# Patient Record
Sex: Male | Born: 1980 | Race: Black or African American | Hispanic: No | Marital: Single | Smoking: Current every day smoker
Health system: Southern US, Community
[De-identification: ages and names within clinical notes are randomized; demographics above are authoritative.]

## PROBLEM LIST (undated history)

## (undated) ENCOUNTER — Emergency Department (HOSPITAL_COMMUNITY): Payer: Self-pay | Source: Home / Self Care

## (undated) ENCOUNTER — Emergency Department (HOSPITAL_COMMUNITY): Payer: Medicare Other | Source: Home / Self Care

## (undated) DIAGNOSIS — I10 Essential (primary) hypertension: Secondary | ICD-10-CM

## (undated) DIAGNOSIS — N186 End stage renal disease: Secondary | ICD-10-CM

## (undated) DIAGNOSIS — Z91199 Patient's noncompliance with other medical treatment and regimen due to unspecified reason: Secondary | ICD-10-CM

## (undated) DIAGNOSIS — K859 Acute pancreatitis without necrosis or infection, unspecified: Secondary | ICD-10-CM

## (undated) DIAGNOSIS — Z992 Dependence on renal dialysis: Secondary | ICD-10-CM

## (undated) DIAGNOSIS — B20 Human immunodeficiency virus [HIV] disease: Secondary | ICD-10-CM

## (undated) DIAGNOSIS — Z9119 Patient's noncompliance with other medical treatment and regimen: Secondary | ICD-10-CM

## (undated) DIAGNOSIS — Z765 Malingerer [conscious simulation]: Secondary | ICD-10-CM

## (undated) DIAGNOSIS — Z5189 Encounter for other specified aftercare: Secondary | ICD-10-CM

## (undated) DIAGNOSIS — F6810 Factitious disorder, unspecified: Secondary | ICD-10-CM

## (undated) DIAGNOSIS — R918 Other nonspecific abnormal finding of lung field: Secondary | ICD-10-CM

## (undated) DIAGNOSIS — N19 Unspecified kidney failure: Secondary | ICD-10-CM

## (undated) DIAGNOSIS — IMO0001 Reserved for inherently not codable concepts without codable children: Secondary | ICD-10-CM

## (undated) HISTORY — PX: AV FISTULA PLACEMENT: SHX1204

## (undated) HISTORY — PX: DIALYSIS FISTULA CREATION: SHX611

## (undated) HISTORY — PX: HIP SURGERY: SHX245

## (undated) HISTORY — PX: HIP FRACTURE SURGERY: SHX118

---

## 2001-12-19 DIAGNOSIS — Z21 Asymptomatic human immunodeficiency virus [HIV] infection status: Secondary | ICD-10-CM

## 2001-12-19 DIAGNOSIS — B20 Human immunodeficiency virus [HIV] disease: Secondary | ICD-10-CM

## 2001-12-19 HISTORY — DX: Human immunodeficiency virus (HIV) disease: B20

## 2001-12-19 HISTORY — DX: Asymptomatic human immunodeficiency virus (hiv) infection status: Z21

## 2010-12-19 DIAGNOSIS — K859 Acute pancreatitis without necrosis or infection, unspecified: Secondary | ICD-10-CM

## 2010-12-19 HISTORY — DX: Acute pancreatitis without necrosis or infection, unspecified: K85.90

## 2012-03-07 ENCOUNTER — Emergency Department: Payer: Self-pay | Admitting: Emergency Medicine

## 2012-03-07 LAB — COMPREHENSIVE METABOLIC PANEL
Albumin: 3.6 g/dL (ref 3.4–5.0)
Alkaline Phosphatase: 120 U/L (ref 50–136)
BUN: 53 mg/dL — ABNORMAL HIGH (ref 7–18)
Bilirubin,Total: 0.7 mg/dL (ref 0.2–1.0)
Creatinine: 9.67 mg/dL — ABNORMAL HIGH (ref 0.60–1.30)
EGFR (African American): 8 — ABNORMAL LOW
EGFR (Non-African Amer.): 7 — ABNORMAL LOW
Glucose: 136 mg/dL — ABNORMAL HIGH (ref 65–99)
Potassium: 5 mmol/L (ref 3.5–5.1)
Sodium: 133 mmol/L — ABNORMAL LOW (ref 136–145)

## 2012-03-07 LAB — CBC
HCT: 37.5 % — ABNORMAL LOW (ref 40.0–52.0)
MCHC: 33.3 g/dL (ref 32.0–36.0)
MCV: 102 fL — ABNORMAL HIGH (ref 80–100)
RBC: 3.67 10*6/uL — ABNORMAL LOW (ref 4.40–5.90)
RDW: 15.3 % — ABNORMAL HIGH (ref 11.5–14.5)

## 2012-03-07 LAB — LIPASE, BLOOD: Lipase: 233 U/L (ref 73–393)

## 2012-03-08 ENCOUNTER — Emergency Department: Payer: Self-pay | Admitting: Emergency Medicine

## 2012-03-08 LAB — COMPREHENSIVE METABOLIC PANEL
Alkaline Phosphatase: 113 U/L (ref 50–136)
Anion Gap: 13 (ref 7–16)
BUN: 62 mg/dL — ABNORMAL HIGH (ref 7–18)
Bilirubin,Total: 0.6 mg/dL (ref 0.2–1.0)
Chloride: 93 mmol/L — ABNORMAL LOW (ref 98–107)
Creatinine: 10.41 mg/dL — ABNORMAL HIGH (ref 0.60–1.30)
EGFR (African American): 8 — ABNORMAL LOW
Glucose: 217 mg/dL — ABNORMAL HIGH (ref 65–99)
Osmolality: 287 (ref 275–301)
Potassium: 5.1 mmol/L (ref 3.5–5.1)
SGOT(AST): 32 U/L (ref 15–37)
Sodium: 131 mmol/L — ABNORMAL LOW (ref 136–145)
Total Protein: 9.1 g/dL — ABNORMAL HIGH (ref 6.4–8.2)

## 2012-03-08 LAB — CBC
HCT: 35.6 % — ABNORMAL LOW (ref 40.0–52.0)
MCH: 33.9 pg (ref 26.0–34.0)
MCV: 102 fL — ABNORMAL HIGH (ref 80–100)
Platelet: 160 10*3/uL (ref 150–440)
RBC: 3.5 10*6/uL — ABNORMAL LOW (ref 4.40–5.90)

## 2012-03-08 LAB — AMYLASE: Amylase: 90 U/L (ref 25–115)

## 2012-03-08 LAB — RETICULOCYTES
Absolute Retic Count: 0.082 10*6/uL (ref 0.024–0.084)
Reticulocyte: 2.3 % — ABNORMAL HIGH (ref 0.5–1.5)

## 2012-03-08 LAB — LIPASE, BLOOD: Lipase: 271 U/L (ref 73–393)

## 2012-03-09 ENCOUNTER — Encounter (HOSPITAL_COMMUNITY): Payer: Self-pay | Admitting: Emergency Medicine

## 2012-03-09 ENCOUNTER — Observation Stay (HOSPITAL_COMMUNITY)
Admission: EM | Admit: 2012-03-09 | Discharge: 2012-03-10 | Discharge status: Against Medical Advice | Payer: Medicare Other | Attending: Internal Medicine | Admitting: Internal Medicine

## 2012-03-09 DIAGNOSIS — D573 Sickle-cell trait: Secondary | ICD-10-CM | POA: Diagnosis present

## 2012-03-09 DIAGNOSIS — E875 Hyperkalemia: Secondary | ICD-10-CM | POA: Insufficient documentation

## 2012-03-09 DIAGNOSIS — Z9119 Patient's noncompliance with other medical treatment and regimen: Secondary | ICD-10-CM | POA: Insufficient documentation

## 2012-03-09 DIAGNOSIS — K859 Acute pancreatitis without necrosis or infection, unspecified: Principal | ICD-10-CM | POA: Diagnosis present

## 2012-03-09 DIAGNOSIS — Z91158 Patient's noncompliance with renal dialysis for other reason: Secondary | ICD-10-CM

## 2012-03-09 DIAGNOSIS — N186 End stage renal disease: Secondary | ICD-10-CM | POA: Diagnosis present

## 2012-03-09 DIAGNOSIS — Z9115 Patient's noncompliance with renal dialysis: Secondary | ICD-10-CM

## 2012-03-09 DIAGNOSIS — B20 Human immunodeficiency virus [HIV] disease: Secondary | ICD-10-CM | POA: Diagnosis present

## 2012-03-09 DIAGNOSIS — R1110 Vomiting, unspecified: Secondary | ICD-10-CM

## 2012-03-09 DIAGNOSIS — I10 Essential (primary) hypertension: Secondary | ICD-10-CM | POA: Diagnosis present

## 2012-03-09 DIAGNOSIS — R1031 Right lower quadrant pain: Secondary | ICD-10-CM | POA: Insufficient documentation

## 2012-03-09 DIAGNOSIS — Z91199 Patient's noncompliance with other medical treatment and regimen due to unspecified reason: Secondary | ICD-10-CM

## 2012-03-09 DIAGNOSIS — I120 Hypertensive chronic kidney disease with stage 5 chronic kidney disease or end stage renal disease: Secondary | ICD-10-CM | POA: Insufficient documentation

## 2012-03-09 DIAGNOSIS — Z992 Dependence on renal dialysis: Secondary | ICD-10-CM | POA: Diagnosis present

## 2012-03-09 DIAGNOSIS — E119 Type 2 diabetes mellitus without complications: Secondary | ICD-10-CM | POA: Diagnosis present

## 2012-03-09 DIAGNOSIS — Z21 Asymptomatic human immunodeficiency virus [HIV] infection status: Secondary | ICD-10-CM | POA: Diagnosis present

## 2012-03-09 HISTORY — DX: Essential (primary) hypertension: I10

## 2012-03-09 HISTORY — DX: Unspecified kidney failure: N19

## 2012-03-09 HISTORY — DX: Encounter for other specified aftercare: Z51.89

## 2012-03-09 HISTORY — DX: Reserved for inherently not codable concepts without codable children: IMO0001

## 2012-03-09 LAB — CBC
HCT: 35.3 % — ABNORMAL LOW (ref 39.0–52.0)
Hemoglobin: 12.1 g/dL — ABNORMAL LOW (ref 13.0–17.0)
MCV: 97.8 fL (ref 78.0–100.0)
RBC: 3.61 MIL/uL — ABNORMAL LOW (ref 4.22–5.81)
WBC: 3.7 10*3/uL — ABNORMAL LOW (ref 4.0–10.5)

## 2012-03-09 LAB — BASIC METABOLIC PANEL
BUN: 82 mg/dL — ABNORMAL HIGH (ref 6–23)
CO2: 18 mEq/L — ABNORMAL LOW (ref 19–32)
Chloride: 89 mEq/L — ABNORMAL LOW (ref 96–112)
Creatinine, Ser: 11.91 mg/dL — ABNORMAL HIGH (ref 0.50–1.35)

## 2012-03-09 LAB — DIFFERENTIAL
Basophils Relative: 1 % (ref 0–1)
Eosinophils Relative: 7 % — ABNORMAL HIGH (ref 0–5)
Lymphs Abs: 0.8 10*3/uL (ref 0.7–4.0)
Monocytes Relative: 6 % (ref 3–12)
Neutro Abs: 2.4 10*3/uL (ref 1.7–7.7)

## 2012-03-09 MED ORDER — MORPHINE SULFATE 4 MG/ML IJ SOLN: 4.0000 mg | Freq: Once | INTRAMUSCULAR | Status: DC

## 2012-03-09 MED ORDER — MORPHINE SULFATE 4 MG/ML IJ SOLN
4.0000 mg | Freq: Once | INTRAMUSCULAR | Status: AC
  Administered 2012-03-09: 4 mg via INTRAVENOUS
  Filled 2012-03-09: qty 1

## 2012-03-09 MED ORDER — ONDANSETRON HCL 4 MG/2ML IJ SOLN
4.0000 mg | Freq: Once | INTRAMUSCULAR | Status: AC
  Administered 2012-03-09: 4 mg via INTRAVENOUS
  Filled 2012-03-09: qty 2

## 2012-03-09 NOTE — ED Notes (Signed)
PT. REPORTS UPPER ABDOMINAL PAIN WITH VOMITTING AND DIARRHEA ONSET THIS MORNING , DENIES FEVER OR CHILLS. HEMODIALYSIS LAST Tuesday.

## 2012-03-09 NOTE — ED Provider Notes (Signed)
History     CSN: [id number]  Arrival date & time 03/09/12  2040   First MD Initiated Contact with Patient 03/09/12 2207      Chief Complaint  Patient presents with  . Abdominal Pain    (Consider location/radiation/quality/duration/timing/severity/associated sxs/prior treatment) HPI Comments: Patient with a history of sickle cell anemia, hypertension, diabetes, and renal failure presents emergency department with chief complaint of upper abdominal pain. Patient gets dialysis Tuesday Thursday Saturday at [facility] and verbalizes medicine and dialysis compliance.  Onset of symptoms occurred last night.  Progression is gradually worsening. Abdominal pain is described as sharp and located in the right and left upper continence.  Pain does not radiate.  Severity is 8/10.  Associated symptoms include nausea, vomiting, and diarrhea.  Patient denies fever, night sweats, chills, shortness of breath, chest pain, leg swelling.  Patient is unable to make urine. No hx of abdominal surgery   Patient is a 31 y.o. male presenting with abdominal pain. The history is provided by the patient.  Abdominal Pain The primary symptoms of the illness include abdominal pain.    Past Medical History  Diagnosis Date  . Hypertension   . Diabetes mellitus   . Sickle cell anemia   . Renal failure   . Renal failure     History reviewed. No pertinent past surgical history.  No family history on file.  History  Substance Use Topics  . Smoking status: Never Smoker   . Smokeless tobacco: Not on file  . Alcohol Use: Yes      Review of Systems  Gastrointestinal: Positive for abdominal pain.  All other systems reviewed and are negative.    Allergies  Review of patient's allergies indicates no known allergies.  Home Medications   Current Outpatient Rx  Name Route Sig Dispense Refill  . ABACAVIR SULFATE-LAMIVUDINE 600-300 MG PO TABS Oral Take 1 tablet by mouth daily.    Marland Kitchen NEPHRO-VITE 0.8 MG  PO TABS Oral Take 0.8 mg by mouth at bedtime.    Marland Kitchen DARUNAVIR ETHANOLATE 400 MG PO TABS Oral Take 800 mg by mouth daily with breakfast.    . INSULIN ASPART 100 UNIT/ML [place] SOLN Subcutaneous Inject 2-6 Units into the skin 3 (three) times daily before meals. Per sliding scale    . INSULIN GLARGINE 100 UNIT/ML [place] SOLN Subcutaneous Inject 8 Units into the skin.    Marland Kitchen LISINOPRIL 5 MG PO TABS Oral Take 5 mg by mouth daily.    Marland Kitchen RITONAVIR 100 MG PO CAPS Oral Take 100 mg by mouth 2 (two) times daily.      BP 178/86  Pulse 89  Temp(Src) 97.8 F (36.6 C) (Oral)  Resp 23  SpO2 99%  Physical Exam  Nursing note and vitals reviewed. Constitutional: Vital signs are normal. He appears well-developed and well-nourished. No distress.  HENT:  Head: Normocephalic and atraumatic.  Mouth/Throat: Uvula is midline, oropharynx is clear and moist and mucous membranes are normal.  Eyes: Conjunctivae and EOM are normal. Pupils are equal, round, and reactive to light.  Neck: Normal range of motion and full passive range of motion without pain. Neck supple. No spinous process tenderness and no muscular tenderness present. No rigidity. No Brudzinski's sign noted.  Cardiovascular: Normal rate and regular rhythm.   Pulmonary/Chest: Effort normal and breath sounds normal. No accessory muscle usage. Not tachypneic. No respiratory distress.  Abdominal: Soft. Normal appearance. He exhibits no distension, no ascites, no pulsatile midline mass and no mass. There is  tenderness in the right lower quadrant. There is no rigidity, no rebound, no guarding, no CVA tenderness, no tenderness at McBurney's point and negative Murphy's sign. No hernia.    Lymphadenopathy:    He has no cervical adenopathy.  Neurological: He is alert.  Skin: Skin is warm and dry. No rash noted. He is not diaphoretic.  Psychiatric: He has a normal mood and affect. His speech is normal and behavior is normal.    ED Course  Procedures (including critical  care time)  Labs Reviewed  CBC - Abnormal; Notable for the following:    WBC 3.7 (*)    RBC 3.61 (*)    Hemoglobin 12.1 (*)    HCT 35.3 (*)    All other components within normal limits  DIFFERENTIAL - Abnormal; Notable for the following:    Eosinophils Relative 7 (*)    All other components within normal limits  BASIC METABOLIC PANEL - Abnormal; Notable for the following:    Sodium 130 (*)    Potassium 5.7 (*)    Chloride 89 (*)    CO2 18 (*)    Glucose, Bld 179 (*)    BUN 82 (*)    Creatinine, Ser 11.91 (*)    GFR calc non Af Amer 5 (*)    GFR calc Af Amer 6 (*)    All other components within normal limits  LIPASE, BLOOD - Abnormal; Notable for the following:    Lipase 129 (*)    All other components within normal limits  GLUCOSE, CAPILLARY - Abnormal; Notable for the following:    Glucose-Capillary 138 (*)    All other components within normal limits  POCT I-STAT, CHEM 8 - Abnormal; Notable for the following:    Sodium 134 (*)    Potassium 5.6 (*)    BUN 85 (*)    Creatinine, Ser 11.90 (*)    Glucose, Bld 170 (*)    Calcium, Ion 1.05 (*)    All other components within normal limits   US Abdomen Complete  03/10/2012  *RADIOLOGY REPORT*  Clinical Data:  Abdominal pain, question biliary disease, history hypertension, diabetes, sickle cell disease, renal failure  ULTRASOUND ABDOMEN:  Technique:  Sonography of upper abdominal structures was performed.  Comparison:  None  Gallbladder:  Normally distended without stones or wall thickening. No pericholecystic fluid or sonographic Murphy sign.  Common bile duct:  Normal caliber, 4 mm diameter  Liver:  Echogenic, likely fatty infiltration, though this can be seen with cirrhosis and certain infiltrative disorders.  No focal hepatic mass or nodularity  IVC:  Normal appearance  Pancreas:  Normal appearance  Spleen:  Normal morphology, slightly prominent in size at 13.7 cm length  Right kidney:  8.9 cm in length.  Cortical thinning and  increased cortical echogenicity.  Atrophic appearance without hydronephrosis. Small complicated nodule 12 x 11 x 12 mm in size and mid right kidney, incompletely characterized.  Left kidney:  9.6 cm length.  Cortical thinning and increased cortical echogenicity.  No mass or hydronephrosis.  Aorta:  Normal caliber  Other:  No free fluid  IMPRESSION: Probable fatty infiltration of the liver. No evidence of gallbladder disease. Atrophic kidneys with medical renal disease changes. Complicated nodule right kidney 12 x 11 x 12 mm in size, question complicated cystic versus solid lesion. Recommend follow-up non emergent noncontrast MR imaging to characterize (noncontrast due to history of renal failure).  Original Report Authenticated By: Lollie Marrow, M.D.     No  diagnosis found.   Date: 03/10/2012  Rate: 82  Rhythm: normal sinus rhythm  QRS Axis: right  Intervals: normal  ST/T Wave abnormalities: normal  Conduction Disutrbances:none  Narrative Interpretation:   Old EKG Reviewed: none available    MDM  Abdominal pain, Hyperkalemia, acute on chronic pancreatitis  US at [facility], ordered CT. Pts K+ came back at 5.6, EKG ordered and Istat to re-evaluate for possible hemolysis.Discussed w Dr. Manus Gunning. 15mg  of kayexalate given. Pt unable to tolerate PO contrast. Plan is to admit for hyperemesis.   Pt to be admitted to triad team 2 medsurg Dr. Benjamine Mola acute on chronic pancreatitis         Jaci Carrel, PA-C 03/10/12 0330

## 2012-03-09 NOTE — ED Notes (Signed)
Patient presents stating he has had vomiting for the past several days  Unable to keep anything down

## 2012-03-09 NOTE — ED Notes (Signed)
Attempt times 1 to start IV without success.  Will have another nurse attempt

## 2012-03-10 ENCOUNTER — Other Ambulatory Visit: Payer: Self-pay

## 2012-03-10 ENCOUNTER — Inpatient Hospital Stay (HOSPITAL_COMMUNITY)
Admission: EM | Admit: 2012-03-10 | Discharge: 2012-03-11 | DRG: 640 | Discharge status: Against Medical Advice | Payer: Self-pay | Attending: Family Medicine | Admitting: Family Medicine

## 2012-03-10 ENCOUNTER — Emergency Department (HOSPITAL_COMMUNITY): Payer: Medicare Other

## 2012-03-10 ENCOUNTER — Encounter (HOSPITAL_COMMUNITY): Payer: Self-pay | Admitting: Emergency Medicine

## 2012-03-10 ENCOUNTER — Encounter (HOSPITAL_COMMUNITY): Payer: Self-pay | Admitting: Radiology

## 2012-03-10 DIAGNOSIS — D5700 Hb-SS disease with crisis, unspecified: Secondary | ICD-10-CM

## 2012-03-10 DIAGNOSIS — Z9115 Patient's noncompliance with renal dialysis: Secondary | ICD-10-CM

## 2012-03-10 DIAGNOSIS — N179 Acute kidney failure, unspecified: Secondary | ICD-10-CM | POA: Diagnosis present

## 2012-03-10 DIAGNOSIS — Z21 Asymptomatic human immunodeficiency virus [HIV] infection status: Secondary | ICD-10-CM | POA: Diagnosis present

## 2012-03-10 DIAGNOSIS — Z794 Long term (current) use of insulin: Secondary | ICD-10-CM

## 2012-03-10 DIAGNOSIS — R112 Nausea with vomiting, unspecified: Secondary | ICD-10-CM | POA: Diagnosis present

## 2012-03-10 DIAGNOSIS — E878 Other disorders of electrolyte and fluid balance, not elsewhere classified: Secondary | ICD-10-CM | POA: Diagnosis present

## 2012-03-10 DIAGNOSIS — E119 Type 2 diabetes mellitus without complications: Secondary | ICD-10-CM | POA: Diagnosis present

## 2012-03-10 DIAGNOSIS — N19 Unspecified kidney failure: Secondary | ICD-10-CM

## 2012-03-10 DIAGNOSIS — I120 Hypertensive chronic kidney disease with stage 5 chronic kidney disease or end stage renal disease: Secondary | ICD-10-CM | POA: Diagnosis present

## 2012-03-10 DIAGNOSIS — E1129 Type 2 diabetes mellitus with other diabetic kidney complication: Secondary | ICD-10-CM | POA: Diagnosis present

## 2012-03-10 DIAGNOSIS — I10 Essential (primary) hypertension: Secondary | ICD-10-CM

## 2012-03-10 DIAGNOSIS — Z9119 Patient's noncompliance with other medical treatment and regimen: Secondary | ICD-10-CM

## 2012-03-10 DIAGNOSIS — E871 Hypo-osmolality and hyponatremia: Secondary | ICD-10-CM | POA: Diagnosis present

## 2012-03-10 DIAGNOSIS — B20 Human immunodeficiency virus [HIV] disease: Secondary | ICD-10-CM | POA: Diagnosis present

## 2012-03-10 DIAGNOSIS — R109 Unspecified abdominal pain: Secondary | ICD-10-CM | POA: Diagnosis present

## 2012-03-10 DIAGNOSIS — Z992 Dependence on renal dialysis: Secondary | ICD-10-CM

## 2012-03-10 DIAGNOSIS — D571 Sickle-cell disease without crisis: Secondary | ICD-10-CM | POA: Diagnosis present

## 2012-03-10 DIAGNOSIS — K859 Acute pancreatitis without necrosis or infection, unspecified: Secondary | ICD-10-CM | POA: Diagnosis present

## 2012-03-10 DIAGNOSIS — N186 End stage renal disease: Secondary | ICD-10-CM | POA: Diagnosis present

## 2012-03-10 DIAGNOSIS — R222 Localized swelling, mass and lump, trunk: Secondary | ICD-10-CM | POA: Diagnosis present

## 2012-03-10 DIAGNOSIS — E875 Hyperkalemia: Principal | ICD-10-CM | POA: Diagnosis present

## 2012-03-10 DIAGNOSIS — D573 Sickle-cell trait: Secondary | ICD-10-CM | POA: Diagnosis present

## 2012-03-10 DIAGNOSIS — R918 Other nonspecific abnormal finding of lung field: Secondary | ICD-10-CM | POA: Diagnosis present

## 2012-03-10 HISTORY — DX: Other nonspecific abnormal finding of lung field: R91.8

## 2012-03-10 HISTORY — DX: End stage renal disease: Z99.2

## 2012-03-10 HISTORY — DX: Dependence on renal dialysis: N18.6

## 2012-03-10 HISTORY — DX: Human immunodeficiency virus (HIV) disease: B20

## 2012-03-10 HISTORY — DX: Acute pancreatitis without necrosis or infection, unspecified: K85.90

## 2012-03-10 HISTORY — DX: Essential (primary) hypertension: I10

## 2012-03-10 LAB — COMPREHENSIVE METABOLIC PANEL
ALT: 11 U/L (ref 0–53)
AST: 12 U/L (ref 0–37)
BUN: 91 mg/dL — ABNORMAL HIGH (ref 6–23)
CO2: 20 mEq/L (ref 19–32)
Chloride: 90 mEq/L — ABNORMAL LOW (ref 96–112)
Glucose, Bld: 101 mg/dL — ABNORMAL HIGH (ref 70–99)
Potassium: 5.9 mEq/L — ABNORMAL HIGH (ref 3.5–5.1)

## 2012-03-10 LAB — CBC
Hemoglobin: 11.2 g/dL — ABNORMAL LOW (ref 13.0–17.0)
MCHC: 34.3 g/dL (ref 30.0–36.0)
Platelets: 197 10*3/uL (ref 150–400)
RBC: 3.32 MIL/uL — ABNORMAL LOW (ref 4.22–5.81)

## 2012-03-10 LAB — RETICULOCYTES
RBC.: 3.32 MIL/uL — ABNORMAL LOW (ref 4.22–5.81)
Retic Count, Absolute: 63.1 10*3/uL (ref 19.0–186.0)
Retic Ct Pct: 1.9 % (ref 0.4–3.1)

## 2012-03-10 LAB — DIFFERENTIAL
Lymphs Abs: 0.9 10*3/uL (ref 0.7–4.0)
Monocytes Absolute: 0.4 10*3/uL (ref 0.1–1.0)
Monocytes Relative: 9 % (ref 3–12)
Neutro Abs: 3 10*3/uL (ref 1.7–7.7)
Neutrophils Relative %: 66 % (ref 43–77)

## 2012-03-10 LAB — GLUCOSE, CAPILLARY
Glucose-Capillary: 105 mg/dL — ABNORMAL HIGH (ref 70–99)
Glucose-Capillary: 121 mg/dL — ABNORMAL HIGH (ref 70–99)
Glucose-Capillary: 87 mg/dL (ref 70–99)

## 2012-03-10 LAB — POCT I-STAT, CHEM 8
Chloride: 105 mEq/L (ref 96–112)
Creatinine, Ser: 11.9 mg/dL — ABNORMAL HIGH (ref 0.50–1.35)
Glucose, Bld: 170 mg/dL — ABNORMAL HIGH (ref 70–99)
HCT: 41 % (ref 39.0–52.0)
Potassium: 5.6 mEq/L — ABNORMAL HIGH (ref 3.5–5.1)

## 2012-03-10 MED ORDER — ALTEPLASE 2 MG IJ SOLR
2.0000 mg | Freq: Once | INTRAMUSCULAR | Status: DC | PRN
  Filled 2012-03-10: qty 2

## 2012-03-10 MED ORDER — PROMETHAZINE HCL 25 MG/ML IJ SOLN
12.5000 mg | Freq: Four times a day (QID) | INTRAMUSCULAR | Status: DC | PRN
  Administered 2012-03-10: 08:00:00 via INTRAVENOUS
  Administered 2012-03-10: 12.5 mg via INTRAVENOUS
  Filled 2012-03-10 (×2): qty 1

## 2012-03-10 MED ORDER — LISINOPRIL 5 MG PO TABS
5.0000 mg | ORAL_TABLET | Freq: Every day | ORAL | Status: DC
  Administered 2012-03-10: 5 mg via ORAL
  Filled 2012-03-10: qty 1

## 2012-03-10 MED ORDER — ZOLPIDEM TARTRATE 5 MG PO TABS: 5.0000 mg | ORAL_TABLET | Freq: Every evening | ORAL | Status: DC | PRN

## 2012-03-10 MED ORDER — RITONAVIR 100 MG PO CAPS
100.0000 mg | ORAL_CAPSULE | Freq: Two times a day (BID) | ORAL | Status: DC
  Administered 2012-03-10: 100 mg via ORAL
  Filled 2012-03-10 (×2): qty 1

## 2012-03-10 MED ORDER — IOHEXOL 300 MG/ML  SOLN
20.0000 mL | INTRAMUSCULAR | Status: AC
  Administered 2012-03-10: 20 mL via ORAL

## 2012-03-10 MED ORDER — ABACAVIR SULFATE-LAMIVUDINE 600-300 MG PO TABS
1.0000 | ORAL_TABLET | Freq: Every day | ORAL | Status: DC
  Filled 2012-03-10: qty 1

## 2012-03-10 MED ORDER — HYDROMORPHONE HCL PF 1 MG/ML IJ SOLN
1.0000 mg | Freq: Once | INTRAMUSCULAR | Status: AC
  Administered 2012-03-10: 1 mg via INTRAVENOUS
  Filled 2012-03-10: qty 1

## 2012-03-10 MED ORDER — LIDOCAINE HCL (PF) 1 % IJ SOLN: 5.0000 mL | INTRAMUSCULAR | Status: DC | PRN

## 2012-03-10 MED ORDER — SODIUM CHLORIDE 0.9 % IV SOLN
INTRAVENOUS | Status: DC
  Administered 2012-03-10: 05:00:00 via INTRAVENOUS

## 2012-03-10 MED ORDER — PROMETHAZINE HCL 25 MG/ML IJ SOLN
25.0000 mg | Freq: Once | INTRAMUSCULAR | Status: AC
  Administered 2012-03-10: 25 mg via INTRAVENOUS
  Filled 2012-03-10: qty 1

## 2012-03-10 MED ORDER — ONDANSETRON HCL 4 MG PO TABS: 4.0000 mg | ORAL_TABLET | Freq: Four times a day (QID) | ORAL | Status: DC | PRN

## 2012-03-10 MED ORDER — INSULIN ASPART 100 UNIT/ML ~~LOC~~ SOLN
0.0000 [IU] | Freq: Three times a day (TID) | SUBCUTANEOUS | Status: DC
  Administered 2012-03-10: 1 [IU] via SUBCUTANEOUS

## 2012-03-10 MED ORDER — HEPARIN SODIUM (PORCINE) 1000 UNIT/ML DIALYSIS
20.0000 [IU]/kg | INTRAMUSCULAR | Status: DC | PRN
  Filled 2012-03-10: qty 2

## 2012-03-10 MED ORDER — DOCUSATE SODIUM 283 MG RE ENEM: 1.0000 | ENEMA | RECTAL | Status: DC | PRN

## 2012-03-10 MED ORDER — ALBUTEROL SULFATE (5 MG/ML) 0.5% IN NEBU
2.5000 mg | INHALATION_SOLUTION | Freq: Once | RESPIRATORY_TRACT | Status: AC
Start: 2012-03-10 — End: 2012-03-11
  Administered 2012-03-11: 2.5 mg via RESPIRATORY_TRACT
  Filled 2012-03-10: qty 0.5

## 2012-03-10 MED ORDER — LIDOCAINE-PRILOCAINE 2.5-2.5 % EX CREA: 1.0000 "application " | TOPICAL_CREAM | CUTANEOUS | Status: DC | PRN

## 2012-03-10 MED ORDER — HEPARIN SODIUM (PORCINE) 1000 UNIT/ML DIALYSIS
1000.0000 [IU] | INTRAMUSCULAR | Status: DC | PRN
  Filled 2012-03-10: qty 1

## 2012-03-10 MED ORDER — ACETAMINOPHEN 650 MG RE SUPP: 650.0000 mg | Freq: Four times a day (QID) | RECTAL | Status: DC | PRN

## 2012-03-10 MED ORDER — HYDROMORPHONE HCL PF 1 MG/ML IJ SOLN
1.0000 mg | INTRAMUSCULAR | Status: DC | PRN
  Administered 2012-03-10: 2 mg via INTRAVENOUS
  Filled 2012-03-10: qty 2

## 2012-03-10 MED ORDER — PENTAFLUOROPROP-TETRAFLUOROETH EX AERO: 1.0000 "application " | INHALATION_SPRAY | CUTANEOUS | Status: DC | PRN

## 2012-03-10 MED ORDER — SODIUM CHLORIDE 0.9 % IV SOLN: 100.0000 mL | INTRAVENOUS | Status: DC | PRN

## 2012-03-10 MED ORDER — LAMIVUDINE 150 MG PO TABS
300.0000 mg | ORAL_TABLET | Freq: Every day | ORAL | Status: DC
  Administered 2012-03-10: 300 mg via ORAL
  Filled 2012-03-10: qty 2

## 2012-03-10 MED ORDER — HYDROMORPHONE HCL PF 1 MG/ML IJ SOLN
1.0000 mg | INTRAMUSCULAR | Status: DC | PRN
  Administered 2012-03-10 (×5): 2 mg via INTRAVENOUS
  Filled 2012-03-10 (×2): qty 1
  Filled 2012-03-10 (×4): qty 2

## 2012-03-10 MED ORDER — ONDANSETRON HCL 4 MG/2ML IJ SOLN
4.0000 mg | Freq: Once | INTRAMUSCULAR | Status: AC
  Administered 2012-03-10: 4 mg via INTRAVENOUS
  Filled 2012-03-10: qty 2

## 2012-03-10 MED ORDER — CALCIUM CARBONATE 1250 MG/5ML PO SUSP: 500.0000 mg | Freq: Four times a day (QID) | ORAL | Status: DC | PRN

## 2012-03-10 MED ORDER — SODIUM POLYSTYRENE SULFONATE 15 GM/60ML PO SUSP
15.0000 g | Freq: Once | ORAL | Status: AC
  Administered 2012-03-10: 15 g via ORAL
  Filled 2012-03-10: qty 60

## 2012-03-10 MED ORDER — CAMPHOR-MENTHOL 0.5-0.5 % EX LOTN
1.0000 "application " | TOPICAL_LOTION | Freq: Three times a day (TID) | CUTANEOUS | Status: DC | PRN
  Filled 2012-03-10: qty 222

## 2012-03-10 MED ORDER — IOHEXOL 300 MG/ML  SOLN
80.0000 mL | Freq: Once | INTRAMUSCULAR | Status: AC | PRN
  Administered 2012-03-10: 80 mL via INTRAVENOUS

## 2012-03-10 MED ORDER — HYDROMORPHONE HCL PF 2 MG/ML IJ SOLN
2.0000 mg | Freq: Once | INTRAMUSCULAR | Status: AC
  Administered 2012-03-10: 2 mg via INTRAVENOUS
  Filled 2012-03-10: qty 1

## 2012-03-10 MED ORDER — DARUNAVIR ETHANOLATE 800 MG PO TABS
800.0000 mg | ORAL_TABLET | Freq: Every day | ORAL | Status: DC
  Administered 2012-03-10: 800 mg via ORAL
  Filled 2012-03-10 (×2): qty 1

## 2012-03-10 MED ORDER — POLYETHYLENE GLYCOL 3350 17 G PO PACK
17.0000 g | PACK | Freq: Every day | ORAL | Status: DC
  Filled 2012-03-10: qty 1

## 2012-03-10 MED ORDER — SODIUM POLYSTYRENE SULFONATE 15 GM/60ML PO SUSP
30.0000 g | Freq: Once | ORAL | Status: AC
  Administered 2012-03-11: 30 g via ORAL
  Filled 2012-03-10: qty 60

## 2012-03-10 MED ORDER — ONDANSETRON HCL 4 MG PO TABS: 4.0000 mg | ORAL_TABLET | Freq: Three times a day (TID) | ORAL | Status: DC | PRN

## 2012-03-10 MED ORDER — SORBITOL 70 % SOLN: 30.0000 mL | Status: DC | PRN

## 2012-03-10 MED ORDER — ACETAMINOPHEN 325 MG PO TABS: 650.0000 mg | ORAL_TABLET | Freq: Four times a day (QID) | ORAL | Status: DC | PRN

## 2012-03-10 MED ORDER — SODIUM CHLORIDE 0.9 % IV BOLUS (SEPSIS): 250.0000 mL | Freq: Once | INTRAVENOUS | Status: DC

## 2012-03-10 MED ORDER — HYDROXYZINE HCL 25 MG PO TABS: 25.0000 mg | ORAL_TABLET | Freq: Three times a day (TID) | ORAL | Status: DC | PRN

## 2012-03-10 MED ORDER — RENA-VITE PO TABS
1.0000 | ORAL_TABLET | Freq: Every day | ORAL | Status: DC
  Filled 2012-03-10: qty 1

## 2012-03-10 MED ORDER — ABACAVIR SULFATE 300 MG PO TABS
600.0000 mg | ORAL_TABLET | Freq: Every day | ORAL | Status: DC
  Administered 2012-03-10: 600 mg via ORAL
  Filled 2012-03-10: qty 2

## 2012-03-10 MED ORDER — HYDROMORPHONE HCL 2 MG PO TABS: 2.0000 mg | ORAL_TABLET | Freq: Four times a day (QID) | ORAL | Status: DC | PRN

## 2012-03-10 MED ORDER — ONDANSETRON HCL 4 MG/2ML IJ SOLN
4.0000 mg | Freq: Four times a day (QID) | INTRAMUSCULAR | Status: DC | PRN
  Administered 2012-03-10 (×2): 4 mg via INTRAVENOUS
  Filled 2012-03-10 (×2): qty 2

## 2012-03-10 MED ORDER — ONDANSETRON HCL 4 MG/2ML IJ SOLN: 4.0000 mg | Freq: Four times a day (QID) | INTRAMUSCULAR | Status: DC | PRN

## 2012-03-10 MED ORDER — DIPHENHYDRAMINE HCL 25 MG PO CAPS
25.0000 mg | ORAL_CAPSULE | Freq: Four times a day (QID) | ORAL | Status: DC | PRN
  Administered 2012-03-10: 25 mg via ORAL
  Filled 2012-03-10: qty 1

## 2012-03-10 MED ORDER — SODIUM CHLORIDE 0.9 % IV BOLUS (SEPSIS)
1000.0000 mL | Freq: Once | INTRAVENOUS | Status: AC
  Administered 2012-03-10: 1000 mL via INTRAVENOUS

## 2012-03-10 MED ORDER — NEPRO/CARBSTEADY PO LIQD: 237.0000 mL | ORAL | Status: DC | PRN

## 2012-03-10 NOTE — ED Notes (Signed)
Patient transported to Ultrasound 

## 2012-03-10 NOTE — Progress Notes (Signed)
Patient still off the unit. Exie Chrismer Joselita,RN

## 2012-03-10 NOTE — ED Notes (Addendum)
Pt reports onset of generalized abdominal pain, nausea, vomiting and diarrhea since last night that "got worse all day today." Pt is on home dialysis, [facility]. CBGs at home 129.

## 2012-03-10 NOTE — ED Notes (Signed)
Patient transported to CT 

## 2012-03-10 NOTE — ED Provider Notes (Signed)
Medical screening examination/treatment/procedure(s) were performed by non-physician practitioner and as supervising physician I was immediately available for consultation/collaboration.  Shelda Jakes, MD 03/10/12 [phone]

## 2012-03-10 NOTE — ED Provider Notes (Addendum)
History     CSN: [id number]  Arrival date & time 03/10/12  7829   First MD Initiated Contact with Patient 03/10/12 2115      Chief Complaint  Patient presents with  . Abdominal Pain    (Consider location/radiation/quality/duration/timing/severity/associated sxs/prior treatment) HPI Comments: Patient complains of a two-day history of upper abdominal pain and vomiting. He has a history of pancreatitis in the past and states this feels the same as his past episodes of pancreatitis. He's had some loose stools today, but no bloody stools or blood in his emesis. No fevers or chills. No cough or congestion. He also complains of joint pain that feels like he might be having the start of a sickle cell crisis. Denies any chest pain or shortness of breath. Patient is concave. Medical history including diabetes, HIV, and is on hemodialysis, which she self administers at home. His physician is in [place] however, he recently moved to this area, but has not yet established medical care here  Patient is a 31 y.o. male presenting with abdominal pain. The history is provided by the patient.  Abdominal Pain The primary symptoms of the illness include abdominal pain, nausea and vomiting. The primary symptoms of the illness do not include fever, fatigue, shortness of breath or diarrhea. The current episode started 2 days ago. The onset of the illness was gradual. The problem has been gradually worsening.  The patient has not had a change in bowel habit. Symptoms associated with the illness do not include chills, diaphoresis, hematuria, frequency or back pain.    Past Medical History  Diagnosis Date  . Renal disorder   . Dialysis care   . DM (diabetes mellitus)   . Sickle cell anemia   . HIV (human immunodeficiency virus infection)     Past Surgical History  Procedure Date  . Dialysis fistula creation     No family history on file.  History  Substance Use Topics  . Smoking status: Current Everyday  Smoker -- 0.1 packs/day  . Smokeless tobacco: Not on file  . Alcohol Use: No      Review of Systems  Constitutional: Negative for fever, chills, diaphoresis and fatigue.  HENT: Negative for congestion, rhinorrhea and sneezing.   Eyes: Negative.   Respiratory: Negative for cough, chest tightness and shortness of breath.   Cardiovascular: Negative for chest pain and leg swelling.  Gastrointestinal: Positive for nausea, vomiting and abdominal pain. Negative for diarrhea and blood in stool.  Genitourinary: Negative for frequency, hematuria, flank pain and difficulty urinating.  Musculoskeletal: Positive for arthralgias. Negative for back pain and joint swelling.  Skin: Negative for rash.  Neurological: Negative for dizziness, speech difficulty, weakness, numbness and headaches.    Allergies  Review of patient's allergies indicates no known allergies.  Home Medications   Current Outpatient Rx  Name Route Sig Dispense Refill  . ABACAVIR SULFATE-LAMIVUDINE 600-300 MG PO TABS Oral Take 1 tablet by mouth daily.     Marland Kitchen NEPHRO-VITE 0.8 MG PO TABS Oral Take by mouth at bedtime.    Marland Kitchen CALCIUM ACETATE 667 MG PO CAPS Oral Take by mouth.    . DARUNAVIR ETHANOLATE 400 MG PO TABS Oral Take 400 mg by mouth daily with breakfast.     . INSULIN ASPART 100 UNIT/ML [place] SOLN Subcutaneous Inject 2-6 Units into the skin 3 (three) times daily before meals.     . INSULIN GLARGINE 100 UNIT/ML [place] SOLN Subcutaneous Inject 8 Units into the skin daily.    Marland Kitchen  LISINOPRIL 5 MG PO TABS Oral Take 5 mg by mouth daily.    Marland Kitchen NEPRO/CARB STEADY PO LIQD Oral Take by mouth.    Marland Kitchen RITONAVIR 100 MG PO CAPS Oral Take 100 mg by mouth daily.       BP 166/80  Pulse 83  Temp(Src) 98.4 F (36.9 C) (Oral)  Resp 18  Ht 5\' 7"  (1.702 m)  Wt 165 lb (74.844 kg)  BMI 25.84 kg/m2  Physical Exam  Constitutional: He is oriented to person, place, and time. He appears well-developed and well-nourished.  HENT:  Head: Normocephalic and  atraumatic.  Eyes: Pupils are equal, round, and reactive to light.  Neck: Normal range of motion. Neck supple.  Cardiovascular: Normal rate, regular rhythm and normal heart sounds.   Pulmonary/Chest: Effort normal and breath sounds normal. No respiratory distress. He has no wheezes. He has no rales. He exhibits no tenderness.  Abdominal: Soft. Bowel sounds are normal. There is tenderness. There is no rebound and no guarding.       Mild tenderness to upper abdomen  Musculoskeletal: Normal range of motion. He exhibits no edema.  Lymphadenopathy:    He has no cervical adenopathy.  Neurological: He is alert and oriented to person, place, and time.  Skin: Skin is warm and dry. No rash noted.  Psychiatric: He has a normal mood and affect.    ED Course  Procedures (including critical care time)  Results for orders placed during the hospital encounter of 03/10/12  DIFFERENTIAL      Component Value Range   Neutrophils Relative 66  43 - 77 (%)   Neutro Abs 3.0  1.7 - 7.7 (K/uL)   Lymphocytes Relative 20  12 - 46 (%)   Lymphs Abs 0.9  0.7 - 4.0 (K/uL)   Monocytes Relative 9  3 - 12 (%)   Monocytes Absolute 0.4  0.1 - 1.0 (K/uL)   Eosinophils Relative 4  0 - 5 (%)   Eosinophils Absolute 0.2  0.0 - 0.7 (K/uL)   Basophils Relative 1  0 - 1 (%)   Basophils Absolute 0.1  0.0 - 0.1 (K/uL)  COMPREHENSIVE METABOLIC PANEL      Component Value Range   Sodium 130 (*) 135 - 145 (mEq/L)   Potassium 5.9 (*) 3.5 - 5.1 (mEq/L)   Chloride 90 (*) 96 - 112 (mEq/L)   CO2 20  19 - 32 (mEq/L)   Glucose, Bld 101 (*) 70 - 99 (mg/dL)   BUN 91 (*) 6 - 23 (mg/dL)   Creatinine, Ser 40.98 (*) 0.50 - 1.35 (mg/dL)   Calcium 9.2  8.4 - 11.9 (mg/dL)   Total Protein 9.0 (*) 6.0 - 8.3 (g/dL)   Albumin 3.5  3.5 - 5.2 (g/dL)   AST 12  0 - 37 (U/L)   ALT 11  0 - 53 (U/L)   Alkaline Phosphatase 104  39 - 117 (U/L)   Total Bilirubin 0.4  0.3 - 1.2 (mg/dL)   GFR calc non Af Amer 4 (*) >90 (mL/min)   GFR calc Af Amer 5  (*) >90 (mL/min)  LIPASE, BLOOD      Component Value Range   Lipase 121 (*) 11 - 59 (U/L)  RETICULOCYTES      Component Value Range   Retic Ct Pct 1.9  0.4 - 3.1 (%)   RBC. 3.32 (*) 4.22 - 5.81 (MIL/uL)   Retic Count, Manual 63.1  19.0 - 186.0 (K/uL)  CBC      Component  Value Range   WBC 4.6  4.0 - 10.5 (K/uL)   RBC 3.32 (*) 4.22 - 5.81 (MIL/uL)   Hemoglobin 11.2 (*) 13.0 - 17.0 (g/dL)   HCT 16.1 (*) 09.6 - 52.0 (%)   MCV 98.5  78.0 - 100.0 (fL)   MCH 33.7  26.0 - 34.0 (pg)   MCHC 34.3  30.0 - 36.0 (g/dL)   RDW 04.5  40.9 - 81.1 (%)   Platelets 197  150 - 400 (K/uL)   No results found.  Date: 03/10/2012  Rate: 81  Rhythm: normal sinus rhythm  QRS Axis: normal  Intervals: normal  ST/T Wave abnormalities: nonspecific ST/T changes, QT prolongation  Conduction Disutrbances:none  Narrative Interpretation:   Old EKG Reviewed: none available     1. Pancreatitis   2. Renal failure   3. Hyperkalemia   4. Sickle cell crisis       MDM  Pt with multiple issues.  Does hemodialysis at home on T/TH/Sat, says that he did it today, but K+ is high, has pancreatitis, feels like he is having sickle cell crisis.  No relief so far with pain meds.  Will consult hospitalist for admission.  Will give some kayexelate.  Pt does not produce urine, so will not give lasix        Rolan Bucco, MD 03/10/12 2325  Rolan Bucco, MD 03/10/12 2329

## 2012-03-10 NOTE — Progress Notes (Signed)
Patient presently not in room. Patient had walked off unit around 5 pm. He stated to writing RN  That Md said it was okay to go off unit. Spoke with Md and she stated she did not advise patient on going off unit. Patient returned after 20 minutes. Patient requested to speak with Md, Md was paged and she stated she was unable to come up at present time d/t being in ER. Patient made aware of this and would not tell writing RN any further information to relay to Md. Md on floor at current time to see patient and patient has walked off unit again, but did not make any staff aware that he was walking off unit. Md present and is aware, staff to notify her if patient comes back to room.

## 2012-03-10 NOTE — Progress Notes (Signed)
Patient still not back in his room,called his home number,received a lady's voicemail. 21:00 ,RN Night Nurse  Supervisor notified. 21:20 Text paged MD on call(T Claiborne Billings) of pt's leaving the unit without letting staff know. Giles Currie [employer]

## 2012-03-10 NOTE — H&P (Signed)
PCP: Have not established   Chief Complaint: Abdominal pain nausea and vomiting   HPI: [name] is an 31 y.o. male with history of HIV positive, diabetes, end-stage renal disease on hemodialysis (2 status there is Saturday), hypertension, sickle cell anemia, presents to emergency room complaining of epigastric discomfort, nausea, vomiting, and loose stools. His previous physician was from [place] area, and he has just moved to this area 2 weeks ago. He has history of pancreatitis previously, and no clear etiology was given to him. He denied any fever, chills, alcohol consumption, drug use, black stool or bloody stool. He has no shortness of breath, chest pain or any cough. Workup in the emergency room included a normal white count 3.7 thousand, lipase elevation of 1.9, creatinine of 11.9 and BUN of 82 with potassium of 5.7. An abdominal pelvic CT showed normal pancreas, but he has infiltrate in his lung, consistent with either pneumonia or a mass, recommended followed to resolution. He also has kidney cysts seen by the CT along with pulmonary nodules as well. Hospitalist was asked to admit patient for treatment of pancreatitis.  Rewiew of Systems:  The patient denies anorexia, fever, weight loss,, vision loss, decreased hearing, hoarseness, chest pain, syncope, dyspnea on exertion, peripheral edema, balance deficits, hemoptysis, melena, hematochezia, severe indigestion/heartburn, hematuria, incontinence, genital sores, muscle weakness, suspicious skin lesions, transient blindness, difficulty walking, depression, unusual weight change, abnormal bleeding, enlarged lymph nodes, angioedema, and breast masses.   Past Medical History  Diagnosis Date  . Hypertension   . Diabetes mellitus   . Sickle cell anemia   . Renal failure   . Renal failure     History reviewed. No pertinent past surgical history.  Medications:  HOME MEDS: Prior to Admission medications   Medication Sig Start Date  End Date Taking? Authorizing Provider  abacavir-lamiVUDine (EPZICOM) 600-300 MG per tablet Take 1 tablet by mouth daily.   Yes Historical Provider, MD  b complex-vitamin c-folic acid (NEPHRO-VITE) 0.8 MG TABS Take 0.8 mg by mouth at bedtime.   Yes Historical Provider, MD  darunavir (PREZISTA) 400 MG tablet Take 800 mg by mouth daily with breakfast.   Yes Historical Provider, MD  insulin aspart (NOVOLOG) 100 UNIT/ML injection Inject 2-6 Units into the skin 3 (three) times daily before meals. Per sliding scale   Yes Historical Provider, MD  insulin glargine (LANTUS) 100 UNIT/ML injection Inject 8 Units into the skin.   Yes Historical Provider, MD  lisinopril (PRINIVIL,ZESTRIL) 5 MG tablet Take 5 mg by mouth daily.   Yes Historical Provider, MD  ritonavir (NORVIR) 100 MG capsule Take 100 mg by mouth 2 (two) times daily.   Yes Historical Provider, MD     Allergies:  No Known Allergies  Social History:   reports that he has never smoked. He does not have any smokeless tobacco history on file. He reports that he drinks alcohol. He reports that he does not use illicit drugs.  Family History: No family history on file.   Physical Exam: Filed Vitals:   03/09/12 2045 03/10/12 0056 03/10/12 0114 03/10/12 0330  BP: 172/83 179/94 178/86 151/81  Pulse: 73 80 89 81  Temp: 98.2 F (36.8 C) 97.9 F (36.6 C) 97.8 F (36.6 C)   TempSrc: Oral Oral Oral   Resp: 16 18 23    SpO2: 100% 97% 99% 97%   Blood pressure 151/81, pulse 81, temperature 97.8 F (36.6 C), temperature source Oral, resp. rate 23, SpO2 97.00%.  GEN:  Pleasant person lying in  the stretcher in no acute distress; cooperative with exam PSYCH:  alert and oriented x4; does not appear anxious or depressed; affect is appropriate. HEENT: Mucous membranes pink and anicteric; PERRLA; EOM intact; no cervical lymphadenopathy nor thyromegaly or carotid bruit; no JVD; Breasts:: Not examined CHEST WALL: No tenderness CHEST: Normal respiration,  clear to auscultation bilaterally HEART: Regular rate and rhythm; no murmurs rubs or gallops BACK: No kyphosis or scoliosis; no CVA tenderness ABDOMEN: Obese, soft , slightly tender in the epigastrium; no masses, no organomegaly, normal abdominal bowel sounds; no pannus; no intertriginous candida. Rectal Exam: Not done EXTREMITIES: No bone or joint deformity; age-appropriate arthropathy of the hands and knees; no edema; no ulcerations. He has an AV fistula in his left arm Genitalia: not examined PULSES: 2+ and symmetric SKIN: Normal hydration no rash or ulceration CNS: Cranial nerves 2-12 grossly intact no focal lateralizing neurologic deficit   Labs & Imaging Results for orders placed during the hospital encounter of 03/09/12 (from the past 48 hour(s))  CBC     Status: Abnormal   Collection Time   03/09/12 10:02 PM      Component Value Range Comment   WBC 3.7 (*) 4.0 - 10.5 (K/uL)    RBC 3.61 (*) 4.22 - 5.81 (MIL/uL)    Hemoglobin 12.1 (*) 13.0 - 17.0 (g/dL)    HCT 16.1 (*) 09.6 - 52.0 (%)    MCV 97.8  78.0 - 100.0 (fL)    MCH 33.5  26.0 - 34.0 (pg)    MCHC 34.3  30.0 - 36.0 (g/dL)    RDW 04.5  40.9 - 81.1 (%)    Platelets 165  150 - 400 (K/uL) PLATELET CLUMPS NOTED ON SMEAR, COUNT APPEARS ADEQUATE  DIFFERENTIAL     Status: Abnormal   Collection Time   03/09/12 10:02 PM      Component Value Range Comment   Neutrophils Relative 65  43 - 77 (%)    Lymphocytes Relative 21  12 - 46 (%)    Monocytes Relative 6  3 - 12 (%)    Eosinophils Relative 7 (*) 0 - 5 (%)    Basophils Relative 1  0 - 1 (%)    Neutro Abs 2.4  1.7 - 7.7 (K/uL)    Lymphs Abs 0.8  0.7 - 4.0 (K/uL)    Monocytes Absolute 0.2  0.1 - 1.0 (K/uL)    Eosinophils Absolute 0.3  0.0 - 0.7 (K/uL)    Basophils Absolute 0.0  0.0 - 0.1 (K/uL)    WBC Morphology MILD LEFT SHIFT (1-5% METAS, OCC MYELO, OCC BANDS)   TOXIC GRANULATION  BASIC METABOLIC PANEL     Status: Abnormal   Collection Time   03/09/12 10:02 PM      Component  Value Range Comment   Sodium 130 (*) 135 - 145 (mEq/L)    Potassium 5.7 (*) 3.5 - 5.1 (mEq/L)    Chloride 89 (*) 96 - 112 (mEq/L)    CO2 18 (*) 19 - 32 (mEq/L)    Glucose, Bld 179 (*) 70 - 99 (mg/dL)    BUN 82 (*) 6 - 23 (mg/dL)    Creatinine, Ser 91.47 (*) 0.50 - 1.35 (mg/dL)    Calcium 9.1  8.4 - 10.5 (mg/dL)    GFR calc non Af Amer 5 (*) >90 (mL/min)    GFR calc Af Amer 6 (*) >90 (mL/min)   LIPASE, BLOOD     Status: Abnormal   Collection Time   03/09/12 10:02  PM      Component Value Range Comment   Lipase 129 (*) 11 - 59 (U/L)   GLUCOSE, CAPILLARY     Status: Abnormal   Collection Time   03/09/12 11:26 PM      Component Value Range Comment   Glucose-Capillary 138 (*) 70 - 99 (mg/dL)   POCT I-STAT, CHEM 8     Status: Abnormal   Collection Time   03/10/12 12:14 AM      Component Value Range Comment   Sodium 134 (*) 135 - 145 (mEq/L)    Potassium 5.6 (*) 3.5 - 5.1 (mEq/L)    Chloride 105  96 - 112 (mEq/L)    BUN 85 (*) 6 - 23 (mg/dL)    Creatinine, Ser 16.10 (*) 0.50 - 1.35 (mg/dL)    Glucose, Bld 960 (*) 70 - 99 (mg/dL)    Calcium, Ion 4.54 (*) 1.12 - 1.32 (mmol/L)    TCO2 20  0 - 100 (mmol/L)    Hemoglobin 13.9  13.0 - 17.0 (g/dL)    HCT 09.8  11.9 - 14.7 (%)    US Abdomen Complete  03/10/2012  *RADIOLOGY REPORT*  Clinical Data:  Abdominal pain, question biliary disease, history hypertension, diabetes, sickle cell disease, renal failure  ULTRASOUND ABDOMEN:  Technique:  Sonography of upper abdominal structures was performed.  Comparison:  None  Gallbladder:  Normally distended without stones or wall thickening. No pericholecystic fluid or sonographic Murphy sign.  Common bile duct:  Normal caliber, 4 mm diameter  Liver:  Echogenic, likely fatty infiltration, though this can be seen with cirrhosis and certain infiltrative disorders.  No focal hepatic mass or nodularity  IVC:  Normal appearance  Pancreas:  Normal appearance  Spleen:  Normal morphology, slightly prominent in size at  13.7 cm length  Right kidney:  8.9 cm in length.  Cortical thinning and increased cortical echogenicity.  Atrophic appearance without hydronephrosis. Small complicated nodule 12 x 11 x 12 mm in size and mid right kidney, incompletely characterized.  Left kidney:  9.6 cm length.  Cortical thinning and increased cortical echogenicity.  No mass or hydronephrosis.  Aorta:  Normal caliber  Other:  No free fluid  IMPRESSION: Probable fatty infiltration of the liver. No evidence of gallbladder disease. Atrophic kidneys with medical renal disease changes. Complicated nodule right kidney 12 x 11 x 12 mm in size, question complicated cystic versus solid lesion. Recommend follow-up non emergent noncontrast MR imaging to characterize (noncontrast due to history of renal failure).  Original Report Authenticated By: Lollie Marrow, M.D.   Ct Abdomen Pelvis W Contrast  03/10/2012  *RADIOLOGY REPORT*  Clinical Data: Right abdominal to mid back pain, nausea, vomiting, diarrhea  CT ABDOMEN AND PELVIS WITH CONTRAST  Technique:  Multidetector CT imaging of the abdomen and pelvis was performed following the standard protocol during bolus administration of intravenous contrast. The patient vomited the majority of the ingested oral contrast.  Sagittal and coronal MPR images reconstructed from axial data set.  Contrast:  80 ml Omnipaque 300 IV.  No significant GI contrast remains.  Comparison: None  Findings: Left lower lobe infiltrate with additional area of more focal opacity at the left lower lobe which may represent additional consolidation or less likely mass. Question 7 mm nodular density right lower lobe. Minimal atelectasis or infiltrate right lung base. Spleen appears mildly enlarged 15.4 x 9.6 x 8.6 cm. No focal abnormalities of the liver, spleen, pancreas, or adrenal glands. Bilateral atrophic kidneys with intermediate to  low attenuation foci in both kidneys which may represent simple or complicated cyst, small solid nodules  not excluded. Extensive atherosclerotic calcifications.  Bladder decompressed. Questionable rectal wall thickening versus artifact from underdistension. Stomach and bowel loops are suboptimally evaluated due to the lack of GI contrast and inadequate distention. No definite bowel dilatation or bowel obstruction. No gross evidence of mass or adenopathy identified on limited assessment.  Marked destruction of the right hip joint with bony debris, synovial thickening and question small joint effusion. Significant bony resorption identified at the SI joints bilaterally. No additional focal bony abnormalities seen.  IMPRESSION: Left lower lobe infiltrate with additional more focal left lower lobe opacity, likely infiltrate but mass not completely excluded; follow-up until clearing is required to exclude underlying mass. Splenomegaly. Atrophic kidneys with multiple small bilateral low to intermediate attenuation lesions, potentially simple of complicated cyst though small solid nodules not completely excluded; a single nodule within the right kidney on ultrasound was noted, mildly complicated, recommend either confirmation of stability by prior outside studies or characterization by MR imaging to exclude small solid lesion. Question rectal wall thickening versus underdistension artifact, proctitis not excluded. Marked destructive changes of the right hip joint. Bilateral sacroiliitis. 7 mm left lower lobe pulmonary nodule, recommendation below. If the patient is at high risk for bronchogenic carcinoma, follow- up chest CT at 3-6 months is recommended.  If the patient is at low risk for bronchogenic carcinoma, follow-up chest CT at 6-12 months is recommended.  This recommendation follows the consensus statement: Guidelines for Management of Small Pulmonary Nodules Detected on CT Scans: A Statement from the Fleischner Society as published in Radiology 2005; 237:395-400.  Original Report Authenticated By: Lollie Marrow, M.D.        Assessment Present on Admission:  .Pancreatitis, acute .ESRD on dialysis .Sickle cell anemia .DM (diabetes mellitus) .HTN (hypertension) .HIV (human immunodeficiency virus infection)   PLAN: Likely acute on chronic pancreatitis. Will admit for IV fluid, gently since he has renal failure. I will give Dilaudid 1-2 mg IV every 2 hours when necessary for pain along with antiemetic. Although the CT scan of the abdomen revealed possible pulmonary infiltrate, he does not have the clinical symptoms of pneumonia, and thus I would not treat. I will contact renal service to that he would not miss his dialysis today. For his diabetes since he is not eating, we'll hold his baseline Lantus, and use insulin sensitive sliding scale. He is HIV positive, and we'll be resuming his antiviral medications. Should be noted that he does not consume any alcohol. He hopefully will get established is with a primary care physician, along with an infectious disease specialist. He is stable, full code, and will be admitted to [facility]. Other plans as per orders.   Salem Lembke 03/10/2012, 4:04 AM

## 2012-03-10 NOTE — ED Notes (Signed)
Pt vomited oral contrast; states he is still in pain. MD made aware. CT called to let them know pt will be scanned without oral contrast. PT updated on POC.

## 2012-03-10 NOTE — Consult Note (Signed)
[facility] Renal Consultation Note  Indication for Consultation:  Management of ESRD/hemodialysis; anemia, hypertension/volume and secondary hyperparathyroidism. This is a patient from [facility] in [place]  HPI: [name] is a 31 y.o. male with ESRD due to HTN per patient, who also is HIV + with Type 1 DM, chronic pancreatitis who has a hx of multiple hospital admissions to different hospitals.  Records received from his home dialysis center include d/c summaries from the following hospitals; full d/c summaries available in the shadow chart    [facility]  discharged 03/07/12 after a one day admission;  his discharge diagnosis was N, V and D which may have been caused by a GI virus plus he has gastroparesis.  His HIV meds were not continued at discharge due to intolderance and he was to follo up with the ID clinic there. He as given pain meds enough to hold him over until he could be seen at the IM clinic for referral to the [facility] (Rx dilaudid 4 mg q 6 hr); Abdominal CT at that time showed patchy bibasilar groundglass opacities which could represent PNA or asp and noted 7 mm pul nodule;with recommeded followup for resolution..  A Labs from 3/18 in [place] were pertinent for GGT 90 (AST and ALT ok) P 9.8 and  Alb 4.5 Ca 9.8.  [facility]  2/10- 2/15 2/013 for abdominal pain, nausea and vomiting -    [facility]  1/15 - 01/05/12 for abdominal pain, nausea and vomiting felt secondary to drug seeking behavior.  His d/c summary showed a left lung mass of uncertain etiology  [facility] 12/09 - 12/01/2011 abdominal pain with nausea and vomiting; no clinical evidence to support acute or chronic pancreatitis; hx noncompliance - see shadow chart for details  According to his dialysis center Charge RN, his attendance is very poor and he has chronic pain issues; at times coming only 3 -4 times per month.  According to the patient he,  He is making  arrangement via his home center to transfer to [facility].   He presented to the ED this am and was admitted with a diagnosis of presumed acute on chronic pancreatitis.  His H & P says he moved here 2 weeks ago and yet he was just discharged from [facility] 3/20   When I asked him if he'd had any recent hospitalizations, he said no.  He said his last HD was Thursday (he had been d/c from [facility] at that time and did not dialyze at his home unit.)   Today he endorses nausea, vomiting  abdominal pain, itching and dry skin.  He is anuric Denies constipation, CP, SOB, asthma, anorexia, neuropathic sx, fever, chills, HA or weakness.   Dialysis Orders:  Center:[facility] in [place] . EDW 75 HD Bath 2k 2.5 Ca  Time 4 hr Heparin tight  Access right upper AVF BFR 500  DFRA 1.5 Zemplar none PTH 1300 mcg IV/HD Epogen 13,000   Units IV/HD    Past Medical History  Diagnosis Date  . Hypertension   . Diabetes mellitus   . Sickle cell anemia   . ESRD   . Noncompliance with dialysis and medical treatment   . HIV (human immunodeficiency virus infection)   . Blood transfusion    Past Surgical History  Procedure Date  . Hip surgery     "screw placed into joint"  . Av fistula placement    Family History  Problem Relation Age of  Onset  . Cervical cancer Mother    Social History: Roma Kayser  reports that he has been smoking Cigarettes.  He has a 2.5 pack-year smoking history. He has never used smokeless tobacco. He reports that he does not drink alcohol or use illicit drugs. No Known Allergies Prior to Admission medications   Medication Sig Start Date End Date Taking? Authorizing Provider  abacavir-lamiVUDine (EPZICOM) 600-300 MG per tablet Take 1 tablet by mouth daily.   Yes Historical Provider, MD  b complex-vitamin c-folic acid (NEPHRO-VITE) 0.8 MG TABS Take 0.8 mg by mouth at bedtime.   Yes Historical Provider, MD  darunavir (PREZISTA) 400 MG tablet Take 800 mg by mouth daily with breakfast.   Yes  Historical Provider, MD  insulin aspart (NOVOLOG) 100 UNIT/ML injection Inject 2-6 Units into the skin 3 (three) times daily before meals. Per sliding scale   Yes Historical Provider, MD  insulin glargine (LANTUS) 100 UNIT/ML injection Inject 8 Units into the skin.   Yes Historical Provider, MD  lisinopril (PRINIVIL,ZESTRIL) 5 MG tablet Take 5 mg by mouth daily.   Yes Historical Provider, MD  ritonavir (NORVIR) 100 MG capsule Take 100 mg by mouth 2 (two) times daily.   Yes Historical Provider, MD   Current Facility-Administered Medications  Medication Dose Route Frequency Provider Last Rate Last Dose  . 0.9 %  sodium chloride infusion   Intravenous Continuous Houston Siren, MD 50 mL/hr at 03/10/12 0458    . abacavir (ZIAGEN) tablet 600 mg  600 mg Oral Daily Joseph Art, DO   600 mg at 03/10/12 1006  . Darunavir Ethanolate (PREZISTA) tablet 800 mg  800 mg Oral Q breakfast Houston Siren, MD   800 mg at 03/10/12 1005  . diphenhydrAMINE (BENADRYL) capsule 25 mg  25 mg Oral Q6H PRN Joseph Art, DO   25 mg at 03/10/12 1157  . HYDROmorphone (DILAUDID) injection 1-2 mg  1-2 mg Intravenous Q3H PRN Joseph Art, DO      . HYDROmorphone (DILAUDID) injection 2 mg  2 mg Intravenous Once Lisette Paz, PA-C   2 mg at 03/10/12 0029  . HYDROmorphone (DILAUDID) injection 2 mg  2 mg Intravenous Once Lisette Paz, PA-C   2 mg at 03/10/12 0126  . HYDROmorphone (DILAUDID) injection 2 mg  2 mg Intravenous Once Lisette Paz, PA-C   2 mg at 03/10/12 0326  . insulin aspart (novoLOG) injection 0-9 Units  0-9 Units Subcutaneous TID WC Houston Siren, MD   1 Units at 03/10/12 0844  . iohexol (OMNIPAQUE) 300 MG/ML solution 20 mL  20 mL Oral Q1 Hr x 2 Medication Radiologist, MD   20 mL at 03/10/12 0033  . iohexol (OMNIPAQUE) 300 MG/ML solution 80 mL  80 mL Intravenous Once PRN Medication Radiologist, MD   80 mL at 03/10/12 0307  . lamiVUDine (EPIVIR) tablet 300 mg  300 mg Oral Daily Joseph Art, DO   300 mg at 03/10/12 1005  .  lisinopril (PRINIVIL,ZESTRIL) tablet 5 mg  5 mg Oral Daily Houston Siren, MD   5 mg at 03/10/12 1005  . morphine 4 MG/ML injection 4 mg  4 mg Intravenous Once Lisette Paz, PA-C   4 mg at 03/09/12 2325  . multivitamin (RENA-VIT) tablet 1 tablet  1 tablet Oral QHS Houston Siren, MD      . ondansetron [facility]) injection 4 mg  4 mg Intravenous Once Lisette Paz, PA-C   4 mg at 03/09/12 2326  . ondansetron (ZOFRAN) injection 4 mg  4 mg Intravenous Once [employer], PA-C   4 mg at 03/10/12 0326  . ondansetron (ZOFRAN) tablet 4 mg  4 mg Oral Q6H PRN Houston Siren, MD       Or  . ondansetron [facility]) injection 4 mg  4 mg Intravenous Q6H PRN Houston Siren, MD   4 mg at 03/10/12 0942  . promethazine (PHENERGAN) injection 12.5 mg  12.5 mg Intravenous Q6H PRN Rolan Lipa, NP   12.5 mg at 03/10/12 1348  . promethazine (PHENERGAN) injection 25 mg  25 mg Intravenous Once [employer], PA-C   25 mg at 03/10/12 0037  . ritonavir (NORVIR) capsule 100 mg  100 mg Oral BID Houston Siren, MD   100 mg at 03/10/12 1004  . sodium polystyrene (KAYEXALATE) 15 GM/60ML suspension 15 g  15 g Oral Once [employer], PA-C   15 g at 03/10/12 0327   Labs: Basic Metabolic Panel:  Lab 03/10/12 1478 03/09/12 2202  NA 134* 130*  K 5.6* 5.7*  CL 105 89*  CO2 -- 18*  GLUCOSE 170* 179*  BUN 85* 82*  CREATININE 11.90* 11.91*  CALCIUM -- 9.1  ALB -- --  PHOS -- --   Liver Function Tests: No results found for this basename: AST:3,ALT:3,ALKPHOS:3,BILITOT:3,PROT:3,ALBUMIN:3 in the last 168 hours  Lab 03/09/12 2202  LIPASE 129*  AMYLASE --  CBC:  Lab 03/10/12 0014 03/09/12 2202  WBC -- 3.7*  NEUTROABS -- 2.4  HGB 13.9 12.1*  HCT 41.0 35.3*  MCV -- 97.8  PLT -- 165   Cardiac Enzymes: No results found for this basename: CKTOTAL:5,CKMB:5,CKMBINDEX:5,TROPONINI:5 in the last 168 hours CBG:  Lab 03/10/12 1207 03/10/12 0757 03/10/12 0501 03/09/12 2326  GLUCAP 87 121* 105* 138*  Studies/Results: US Abdomen Complete  03/10/2012   *RADIOLOGY REPORT*  Clinical Data:  Abdominal pain, question biliary disease, history hypertension, diabetes, sickle cell disease, renal failure  ULTRASOUND ABDOMEN:  Technique:  Sonography of upper abdominal structures was performed.  Comparison:  None  Gallbladder:  Normally distended without stones or wall thickening. No pericholecystic fluid or sonographic Murphy sign.  Common bile duct:  Normal caliber, 4 mm diameter  Liver:  Echogenic, likely fatty infiltration, though this can be seen with cirrhosis and certain infiltrative disorders.  No focal hepatic mass or nodularity  IVC:  Normal appearance  Pancreas:  Normal appearance  Spleen:  Normal morphology, slightly prominent in size at 13.7 cm length  Right kidney:  8.9 cm in length.  Cortical thinning and increased cortical echogenicity.  Atrophic appearance without hydronephrosis. Small complicated nodule 12 x 11 x 12 mm in size and mid right kidney, incompletely characterized.  Left kidney:  9.6 cm length.  Cortical thinning and increased cortical echogenicity.  No mass or hydronephrosis.  Aorta:  Normal caliber  Other:  No free fluid  IMPRESSION: Probable fatty infiltration of the liver. No evidence of gallbladder disease. Atrophic kidneys with medical renal disease changes. Complicated nodule right kidney 12 x 11 x 12 mm in size, question complicated cystic versus solid lesion. Recommend follow-up non emergent noncontrast MR imaging to characterize (noncontrast due to history of renal failure).  Original Report Authenticated By: Lollie Marrow, M.D.   Ct Abdomen Pelvis W Contrast  03/10/2012  *RADIOLOGY REPORT*  Clinical Data: Right abdominal to mid back pain, nausea, vomiting, diarrhea  CT ABDOMEN AND PELVIS WITH CONTRAST  Technique:  Multidetector CT imaging of the abdomen and pelvis was performed following the standard protocol during bolus administration of intravenous  contrast. The patient vomited the majority of the ingested oral contrast.  Sagittal  and coronal MPR images reconstructed from axial data set.  Contrast:  80 ml Omnipaque 300 IV.  No significant GI contrast remains.  Comparison: None  Findings: Left lower lobe infiltrate with additional area of more focal opacity at the left lower lobe which may represent additional consolidation or less likely mass. Question 7 mm nodular density right lower lobe. Minimal atelectasis or infiltrate right lung base. Spleen appears mildly enlarged 15.4 x 9.6 x 8.6 cm. No focal abnormalities of the liver, spleen, pancreas, or adrenal glands. Bilateral atrophic kidneys with intermediate to low attenuation foci in both kidneys which may represent simple or complicated cyst, small solid nodules not excluded. Extensive atherosclerotic calcifications.  Bladder decompressed. Questionable rectal wall thickening versus artifact from underdistension. Stomach and bowel loops are suboptimally evaluated due to the lack of GI contrast and inadequate distention. No definite bowel dilatation or bowel obstruction. No gross evidence of mass or adenopathy identified on limited assessment.  Marked destruction of the right hip joint with bony debris, synovial thickening and question small joint effusion. Significant bony resorption identified at the SI joints bilaterally. No additional focal bony abnormalities seen.  IMPRESSION: Left lower lobe infiltrate with additional more focal left lower lobe opacity, likely infiltrate but mass not completely excluded; follow-up until clearing is required to exclude underlying mass. Splenomegaly. Atrophic kidneys with multiple small bilateral low to intermediate attenuation lesions, potentially simple of complicated cyst though small solid nodules not completely excluded; a single nodule within the right kidney on ultrasound was noted, mildly complicated, recommend either confirmation of stability by prior outside studies or characterization by MR imaging to exclude small solid lesion. Question rectal  wall thickening versus underdistension artifact, proctitis not excluded. Marked destructive changes of the right hip joint. Bilateral sacroiliitis. 7 mm left lower lobe pulmonary nodule, recommendation below. If the patient is at high risk for bronchogenic carcinoma, follow- up chest CT at 3-6 months is recommended.  If the patient is at low risk for bronchogenic carcinoma, follow-up chest CT at 6-12 months is recommended.  This recommendation follows the consensus statement: Guidelines for Management of Small Pulmonary Nodules Detected on CT Scans: A Statement from the Fleischner Society as published in Radiology 2005; 237:395-400.  Original Report Authenticated By: Lollie Marrow, M.D.    ROS: Negative except for HPI   Physical Exam: Filed Vitals:   03/10/12 0454 03/10/12 0455 03/10/12 0900 03/10/12 1326  BP: 176/88  177/93 161/84  Pulse: 94  113 82  Temp: 98.7 F (37.1 C)  97.5 F (36.4 C) 97.3 F (36.3 C)  TempSrc: Oral  Oral Oral  Resp: 20  19 18   Height:  5\' 7"  (1.702 m)    Weight: 82.3 kg (181 lb 7 oz)     SpO2: 96%  90% 98%     General: Well developed, fairly well nourished, in no acute distress. Sitting in chair Head: Normocephalic, atraumatic, mucus membranes are moist; actinic changes on face Neck: Supple.  Lungs: Clear bilaterally to auscultation without wheezes, rales, or rhonchi. Breathing is unlabored. Heart: RRR with S1 S2. No murmurs, rubs, or gallops appreciated. Abdomen: Soft,slightly tender left upper quadrant,old striae,  non-distended with diminishedbowel sounds. No rebound/guarding. M-S:  Strength and tone appear normal for age; giets in and out of bed without difficulty. Lower extremities:without edema or ischemic changes, no open wounds; tr LE edema  Neuro: Alert and oriented X 3. Moves all extremities spontaneously.  Psych:  Responds to questions appropriately with a normal affect. Dialysis Access:right upper AVF patent; also has right upper arm IV; old failed  grafts in left arm Skin: ashy with scattered prurigo nodularis changes on extremities  Assessment/Plan: 1.Adominal pain with nausea and vomiting most likely due to recurrent pancreatitis; lipase is elevated.  He was well documented abdominal pain with this being the 5th admission in 4 months in 4 different hospitals; he flat out told me he had not been admitted to any hospital recently  2. ESRD -  With hyperkalemia. We would be glad to dialyze him here according to his usual orders;  He has a long history of noncompliance with outpt dialysis; fragmented health care management with going to different hospitals and not following up with appointments plus drug seeking behaviors. It is doubtful if he would be accepted into the practice of [facility] as an outpatient.  IV d/c d from AVF arm.  Plan do not restart. PO dilaudid only  3 Hypertension/volume  - volume control 4. Anemia  - Hgb >12 hold ESA 5. Metabolic bone disease - P and Ca 9.8; uncontrolled due to noncompliance with binders; not elegible for Zemplar; he is supposed to be on fosrenol per [facility] records - can resume when taking food ( he tells me 3 fosrenol and 3 phoslo with meals) 6. Nutrition - ok, renal diet 7. DM - per primary 8. Chronic pain -note he was given a Rx for dilaudid after Kendell Bane d/c 3/20: I don't know # Rx 9. Medical and dialysis noncompliance - have discussed issues with previous admissions with Dr. Benjamine Mola. 9. HIV - notes from [facility] state they recommended holding HIV meds due to GI SE  Sheffield Slider, PA-C [facility] [address] [phone] 03/10/2012, 4:57 PM   Patient seen and examined and agree with assessment and plan as above.  Vinson Moselle  MD [facility] [phone] pgr    [phone] cell 03/10/2012, 9:32 PM

## 2012-03-10 NOTE — Progress Notes (Addendum)
Subjective: Still having abdominal pain, tolerating some medications by mouth   Objective: Vital signs in last 24 hours: Filed Vitals:   03/10/12 0454 03/10/12 0455 03/10/12 0900 03/10/12 1326  BP: 176/88  177/93 161/84  Pulse: 94  113 82  Temp: 98.7 F (37.1 C)  97.5 F (36.4 C) 97.3 F (36.3 C)  TempSrc: Oral  Oral Oral  Resp: 20  19 18   Height:  5\' 7"  (1.702 m)    Weight: 82.3 kg (181 lb 7 oz)     SpO2: 96%  90% 98%   Weight change:   Intake/Output Summary (Last 24 hours) at 03/10/12 1417 Last data filed at 03/10/12 1329  Gross per 24 hour  Intake 1051.67 ml  Output      0 ml  Net 1051.67 ml    Physical Exam: General: Awake, Oriented, No acute distress. HEENT: EOMI. Neck: Supple CV: S1 and S2, rrr Lungs: Clear to ascultation bilaterally, no wheezing Abdomen: Soft, Nontender, Nondistended, +bowel sounds. Ext: Good pulses. Trace edema.   Lab Results:  Basename 03/10/12 0014 03/09/12 2202  NA 134* 130*  K 5.6* 5.7*  CL 105 89*  CO2 -- 18*  GLUCOSE 170* 179*  BUN 85* 82*  CREATININE 11.90* 11.91*  CALCIUM -- 9.1  MG -- --  PHOS -- --   No results found for this basename: AST:2,ALT:2,ALKPHOS:2,BILITOT:2,PROT:2,ALBUMIN:2 in the last 72 hours  Basename 03/09/12 2202  LIPASE 129*  AMYLASE --    Basename 03/10/12 0014 03/09/12 2202  WBC -- 3.7*  NEUTROABS -- 2.4  HGB 13.9 12.1*  HCT 41.0 35.3*  MCV -- 97.8  PLT -- 165   No results found for this basename: CKTOTAL:3,CKMB:3,CKMBINDEX:3,TROPONINI:3 in the last 72 hours No components found with this basename: POCBNP:3 No results found for this basename: DDIMER:2 in the last 72 hours No results found for this basename: HGBA1C:2 in the last 72 hours No results found for this basename: CHOL:2,HDL:2,LDLCALC:2,TRIG:2,CHOLHDL:2,LDLDIRECT:2 in the last 72 hours No results found for this basename: TSH,T4TOTAL,FREET3,T3FREE,THYROIDAB in the last 72 hours No results found for this basename:  VITAMINB12:2,FOLATE:2,FERRITIN:2,TIBC:2,IRON:2,RETICCTPCT:2 in the last 72 hours  Micro Results: Recent Results (from the past 240 hour(s))  MRSA PCR SCREENING     Status: Normal   Collection Time   03/10/12  5:25 AM      Component Value Range Status Comment   MRSA by PCR NEGATIVE  NEGATIVE  Final     Studies/Results: US Abdomen Complete  03/10/2012  *RADIOLOGY REPORT*  Clinical Data:  Abdominal pain, question biliary disease, history hypertension, diabetes, sickle cell disease, renal failure  ULTRASOUND ABDOMEN:  Technique:  Sonography of upper abdominal structures was performed.  Comparison:  None  Gallbladder:  Normally distended without stones or wall thickening. No pericholecystic fluid or sonographic Murphy sign.  Common bile duct:  Normal caliber, 4 mm diameter  Liver:  Echogenic, likely fatty infiltration, though this can be seen with cirrhosis and certain infiltrative disorders.  No focal hepatic mass or nodularity  IVC:  Normal appearance  Pancreas:  Normal appearance  Spleen:  Normal morphology, slightly prominent in size at 13.7 cm length  Right kidney:  8.9 cm in length.  Cortical thinning and increased cortical echogenicity.  Atrophic appearance without hydronephrosis. Small complicated nodule 12 x 11 x 12 mm in size and mid right kidney, incompletely characterized.  Left kidney:  9.6 cm length.  Cortical thinning and increased cortical echogenicity.  No mass or hydronephrosis.  Aorta:  Normal caliber  Other:  No free fluid  IMPRESSION: Probable fatty infiltration of the liver. No evidence of gallbladder disease. Atrophic kidneys with medical renal disease changes. Complicated nodule right kidney 12 x 11 x 12 mm in size, question complicated cystic versus solid lesion. Recommend follow-up non emergent noncontrast MR imaging to characterize (noncontrast due to history of renal failure).  Original Report Authenticated By: Lollie Marrow, M.D.   Ct Abdomen Pelvis W Contrast  03/10/2012   *RADIOLOGY REPORT*  Clinical Data: Right abdominal to mid back pain, nausea, vomiting, diarrhea  CT ABDOMEN AND PELVIS WITH CONTRAST  Technique:  Multidetector CT imaging of the abdomen and pelvis was performed following the standard protocol during bolus administration of intravenous contrast. The patient vomited the majority of the ingested oral contrast.  Sagittal and coronal MPR images reconstructed from axial data set.  Contrast:  80 ml Omnipaque 300 IV.  No significant GI contrast remains.  Comparison: None  Findings: Left lower lobe infiltrate with additional area of more focal opacity at the left lower lobe which may represent additional consolidation or less likely mass. Question 7 mm nodular density right lower lobe. Minimal atelectasis or infiltrate right lung base. Spleen appears mildly enlarged 15.4 x 9.6 x 8.6 cm. No focal abnormalities of the liver, spleen, pancreas, or adrenal glands. Bilateral atrophic kidneys with intermediate to low attenuation foci in both kidneys which may represent simple or complicated cyst, small solid nodules not excluded. Extensive atherosclerotic calcifications.  Bladder decompressed. Questionable rectal wall thickening versus artifact from underdistension. Stomach and bowel loops are suboptimally evaluated due to the lack of GI contrast and inadequate distention. No definite bowel dilatation or bowel obstruction. No gross evidence of mass or adenopathy identified on limited assessment.  Marked destruction of the right hip joint with bony debris, synovial thickening and question small joint effusion. Significant bony resorption identified at the SI joints bilaterally. No additional focal bony abnormalities seen.  IMPRESSION: Left lower lobe infiltrate with additional more focal left lower lobe opacity, likely infiltrate but mass not completely excluded; follow-up until clearing is required to exclude underlying mass. Splenomegaly. Atrophic kidneys with multiple small  bilateral low to intermediate attenuation lesions, potentially simple of complicated cyst though small solid nodules not completely excluded; a single nodule within the right kidney on ultrasound was noted, mildly complicated, recommend either confirmation of stability by prior outside studies or characterization by MR imaging to exclude small solid lesion. Question rectal wall thickening versus underdistension artifact, proctitis not excluded. Marked destructive changes of the right hip joint. Bilateral sacroiliitis. 7 mm left lower lobe pulmonary nodule, recommendation below. If the patient is at high risk for bronchogenic carcinoma, follow- up chest CT at 3-6 months is recommended.  If the patient is at low risk for bronchogenic carcinoma, follow-up chest CT at 6-12 months is recommended.  This recommendation follows the consensus statement: Guidelines for Management of Small Pulmonary Nodules Detected on CT Scans: A Statement from the Fleischner Society as published in Radiology 2005; 237:395-400.  Original Report Authenticated By: Lollie Marrow, M.D.    Medications: I have reviewed the patient's current medications. Scheduled Meds:   . abacavir  600 mg Oral Daily  . darunavir  800 mg Oral Q breakfast  .  HYDROmorphone (DILAUDID) injection  2 mg Intravenous Once  .  HYDROmorphone (DILAUDID) injection  2 mg Intravenous Once  .  HYDROmorphone (DILAUDID) injection  2 mg Intravenous Once  . insulin aspart  0-9 Units Subcutaneous TID WC  . iohexol  20  mL Oral Q1 Hr x 2  . lamiVUDine  300 mg Oral Daily  . lisinopril  5 mg Oral Daily  .  morphine injection  4 mg Intravenous Once  . multivitamin  1 tablet Oral QHS  . ondansetron  4 mg Intravenous Once  . ondansetron (ZOFRAN) IV  4 mg Intravenous Once  . promethazine  25 mg Intravenous Once  . ritonavir  100 mg Oral BID  . sodium polystyrene  15 g Oral Once  . DISCONTD: abacavir-lamiVUDine  1 tablet Oral Daily  . DISCONTD:  morphine injection  4 mg  Intravenous Once   Continuous Infusions:   . sodium chloride 50 mL/hr at 03/10/12 0458   PRN Meds:.diphenhydrAMINE, HYDROmorphone, iohexol, ondansetron (ZOFRAN) IV, ondansetron, promethazine  Assessment/Plan:   Pancreatitis, acute- pain meds, IVF, U/S negative for GB disease, related to HIV medications   ESRD on dialysis- continue HD   Sickle cell trait   DM (diabetes mellitus)- BS low as patient not eating will need to start lantus soon   HTN (hypertension)   HIV (human immunodeficiency virus infection)- needs to have follow up here in [place] PCP in [place]  N/V- related to pancreatitis   ADDENDUM: Records received from his home dialysis center include d/c summaries from the following hospitals; full d/c summaries available in the shadow chart  [facility] discharged 03/07/12 after a one day admission; his discharge diagnosis was N, V and D which may have been caused by a GI virus plus he has gastroparesis. His HIV meds were not continued at discharge due to intolderance and he was to follo up with the ID clinic there. He as given pain meds enough to hold him over until he could be seen at the IM clinic for referral to the [facility] (Rx dilaudid 4 mg q 6 hr); Abdominal CT at that time showed patchy bibasilar groundglass opacities which could represent PNA or asp and noted 7 mm pul nodule;with recommeded followup for resolution.. A Labs from 3/18 in [place] were pertinent for GGT 90 (AST and ALT ok) P 9.8 and Alb 4.5 Ca 9.8. [facility] 2/10- 2/15 2/013 for abdominal pain, nausea and vomiting -  [facility] 1/15 - 01/05/12 for abdominal pain, nausea and vomiting felt secondary to drug seeking behavior. His d/c summary showed a left lung mass of uncertain etiology  [facility] 12/09 - 12/01/2011 abdominal pain with nausea and vomiting; no clinical evidence to support acute or chronic pancreatitis; hx noncompliance - see shadow chart for details  Spoke with patient who  got angry and left the floor.     LOS: 1 day  Jazzlin Clements, DO 03/10/2012, 2:17 PM

## 2012-03-10 NOTE — Progress Notes (Signed)
03/10/12 0500 Nursing Admission Note Pt rec'd to [id number] via stretcher from the ED. A&Ox4, VSS, no distress noted. Pt is able to ambulate independently. Lives at home alone. No contact isolation. HD pt on T-Th-Sat, states he missed Tuesday's HD due to persistent n/v, but was able to make it on Thursday. Skin is overall intact, but pt refused a thorough skin assessment at this time. Oriented to unit and surroundings. Call bell within reach. Will continue to monitor. C.Merril Isakson, RN.

## 2012-03-11 ENCOUNTER — Encounter (HOSPITAL_COMMUNITY): Payer: Self-pay | Admitting: Internal Medicine

## 2012-03-11 ENCOUNTER — Emergency Department (HOSPITAL_COMMUNITY): Payer: Self-pay

## 2012-03-11 DIAGNOSIS — B20 Human immunodeficiency virus [HIV] disease: Secondary | ICD-10-CM | POA: Diagnosis present

## 2012-03-11 DIAGNOSIS — R918 Other nonspecific abnormal finding of lung field: Secondary | ICD-10-CM | POA: Diagnosis present

## 2012-03-11 DIAGNOSIS — E875 Hyperkalemia: Secondary | ICD-10-CM | POA: Diagnosis present

## 2012-03-11 DIAGNOSIS — E119 Type 2 diabetes mellitus without complications: Secondary | ICD-10-CM | POA: Diagnosis present

## 2012-03-11 DIAGNOSIS — D571 Sickle-cell disease without crisis: Secondary | ICD-10-CM | POA: Diagnosis present

## 2012-03-11 DIAGNOSIS — I10 Essential (primary) hypertension: Secondary | ICD-10-CM | POA: Diagnosis present

## 2012-03-11 DIAGNOSIS — Z992 Dependence on renal dialysis: Secondary | ICD-10-CM | POA: Diagnosis present

## 2012-03-11 DIAGNOSIS — K859 Acute pancreatitis without necrosis or infection, unspecified: Secondary | ICD-10-CM | POA: Diagnosis present

## 2012-03-11 DIAGNOSIS — E871 Hypo-osmolality and hyponatremia: Secondary | ICD-10-CM | POA: Diagnosis present

## 2012-03-11 DIAGNOSIS — E878 Other disorders of electrolyte and fluid balance, not elsewhere classified: Secondary | ICD-10-CM | POA: Diagnosis present

## 2012-03-11 LAB — BASIC METABOLIC PANEL
BUN: 92 mg/dL — ABNORMAL HIGH (ref 6–23)
Calcium: 8.9 mg/dL (ref 8.4–10.5)
Creatinine, Ser: 13.61 mg/dL — ABNORMAL HIGH (ref 0.50–1.35)
GFR calc Af Amer: 5 mL/min — ABNORMAL LOW (ref >90)
GFR calc non Af Amer: 4 mL/min — ABNORMAL LOW (ref >90)

## 2012-03-11 LAB — CBC
HCT: 33.2 % — ABNORMAL LOW (ref 39.0–52.0)
MCHC: 33.4 g/dL (ref 30.0–36.0)
Platelets: 176 10*3/uL (ref 150–400)
RDW: 14.4 % (ref 11.5–15.5)
WBC: 4 10*3/uL (ref 4.0–10.5)

## 2012-03-11 MED ORDER — RENA-VITE PO TABS
1.0000 | ORAL_TABLET | Freq: Every day | ORAL | Status: DC
  Filled 2012-03-11: qty 1

## 2012-03-11 MED ORDER — INSULIN ASPART 100 UNIT/ML ~~LOC~~ SOLN: 2.0000 [IU] | Freq: Three times a day (TID) | SUBCUTANEOUS | Status: DC

## 2012-03-11 MED ORDER — ONDANSETRON HCL 4 MG/2ML IJ SOLN
INTRAMUSCULAR | Status: AC
  Administered 2012-03-11: 4 mg via INTRAVENOUS
  Filled 2012-03-11: qty 2

## 2012-03-11 MED ORDER — ACETAMINOPHEN 325 MG PO TABS: 650.0000 mg | ORAL_TABLET | Freq: Four times a day (QID) | ORAL | Status: DC | PRN

## 2012-03-11 MED ORDER — ONDANSETRON HCL 4 MG/2ML IJ SOLN
4.0000 mg | Freq: Four times a day (QID) | INTRAMUSCULAR | Status: DC | PRN
  Administered 2012-03-11: 4 mg via INTRAVENOUS
  Filled 2012-03-11: qty 2

## 2012-03-11 MED ORDER — ACETAMINOPHEN 650 MG RE SUPP: 650.0000 mg | Freq: Four times a day (QID) | RECTAL | Status: DC | PRN

## 2012-03-11 MED ORDER — HYDROMORPHONE HCL PF 2 MG/ML IJ SOLN
2.0000 mg | Freq: Once | INTRAMUSCULAR | Status: AC
  Administered 2012-03-11: 2 mg via INTRAVENOUS
  Filled 2012-03-11: qty 1

## 2012-03-11 MED ORDER — DARUNAVIR ETHANOLATE 400 MG PO TABS
400.0000 mg | ORAL_TABLET | Freq: Every day | ORAL | Status: DC
  Filled 2012-03-11: qty 1

## 2012-03-11 MED ORDER — SENNA 8.6 MG PO TABS: 1.0000 | ORAL_TABLET | Freq: Two times a day (BID) | ORAL | Status: DC

## 2012-03-11 MED ORDER — OXYCODONE HCL 5 MG PO TABS: 5.0000 mg | ORAL_TABLET | ORAL | Status: DC | PRN

## 2012-03-11 MED ORDER — ONDANSETRON HCL 4 MG PO TABS: 4.0000 mg | ORAL_TABLET | Freq: Four times a day (QID) | ORAL | Status: DC | PRN

## 2012-03-11 MED ORDER — RITONAVIR 100 MG PO CAPS
100.0000 mg | ORAL_CAPSULE | Freq: Every day | ORAL | Status: DC
Start: 2012-03-11 — End: 2012-03-11
  Filled 2012-03-11: qty 1

## 2012-03-11 MED ORDER — LISINOPRIL 5 MG PO TABS
5.0000 mg | ORAL_TABLET | Freq: Every day | ORAL | Status: DC
Start: 2012-03-11 — End: 2012-03-11
  Filled 2012-03-11: qty 1

## 2012-03-11 MED ORDER — ALBUTEROL SULFATE (5 MG/ML) 0.5% IN NEBU
2.5000 mg | INHALATION_SOLUTION | RESPIRATORY_TRACT | Status: DC
  Administered 2012-03-11: 2.5 mg via RESPIRATORY_TRACT
  Filled 2012-03-11: qty 0.5

## 2012-03-11 MED ORDER — ABACAVIR SULFATE-LAMIVUDINE 600-300 MG PO TABS: 1.0000 | ORAL_TABLET | Freq: Every day | ORAL | Status: DC

## 2012-03-11 MED ORDER — DOCUSATE SODIUM 100 MG PO CAPS
100.0000 mg | ORAL_CAPSULE | Freq: Two times a day (BID) | ORAL | Status: DC
  Filled 2012-03-11 (×2): qty 1

## 2012-03-11 MED ORDER — SODIUM CHLORIDE 0.9 % IJ SOLN
3.0000 mL | Freq: Two times a day (BID) | INTRAMUSCULAR | Status: DC
  Administered 2012-03-11: 3 mL via INTRAVENOUS

## 2012-03-11 MED ORDER — CALCIUM ACETATE 667 MG PO CAPS
667.0000 mg | ORAL_CAPSULE | Freq: Three times a day (TID) | ORAL | Status: DC
  Filled 2012-03-11 (×3): qty 1

## 2012-03-11 MED ORDER — HEPARIN SODIUM (PORCINE) 5000 UNIT/ML IJ SOLN
5000.0000 [IU] | Freq: Three times a day (TID) | INTRAMUSCULAR | Status: DC
  Administered 2012-03-11: 5000 [IU] via SUBCUTANEOUS
  Filled 2012-03-11 (×4): qty 1

## 2012-03-11 MED ORDER — SODIUM POLYSTYRENE SULFONATE 15 GM/60ML PO SUSP
ORAL | Status: AC
  Filled 2012-03-11: qty 60

## 2012-03-11 MED ORDER — INSULIN GLARGINE 100 UNIT/ML ~~LOC~~ SOLN: 8.0000 [IU] | Freq: Every day | SUBCUTANEOUS | Status: DC

## 2012-03-11 MED ORDER — HYDROMORPHONE HCL PF 1 MG/ML IJ SOLN
2.0000 mg | INTRAMUSCULAR | Status: DC | PRN
  Administered 2012-03-11 (×2): 2 mg via INTRAVENOUS
  Filled 2012-03-11: qty 1
  Filled 2012-03-11 (×2): qty 2

## 2012-03-11 NOTE — Progress Notes (Signed)
PHARMACIST - PHYSICIAN COMMUNICATION DR:   [facility] CONCERNING:  Epzicom and ESRD Per manufacturer  Contraindicated to use in CrCl < 50, due to combination product and unable to adjust lamivudine dose.  RECOMMENDATION: Hold epzicom for now.  Obtain ID consult for HIV meds in setting of ESRD.  Spoke with Dr. Kaylyn Layer and he agreed.  Luetta Nutting PharmD, BCPS  03/11/2012, 2:47 AM

## 2012-03-11 NOTE — Progress Notes (Signed)
Patient arrived onto unit from [facility]/emergency dept at approx 0230. He was dissatisfied with the time frame with which he could be administered pain meds. I gave him 2mg  Dilaudid twice, 2 hours apart for pain. Lab called at 0600 for a critical lab. I notified on-call physician, who at that time recognized that the patient had been to several different hospitals registering under an alias, the last being at [facility] a day prior to this admission. When confronted, the pt took his belongings and left AMA.

## 2012-03-11 NOTE — H&P (Signed)
PCP:  Dr. Morrie Sheldon in [place], [place] Harless Nakayama in ID at [facility] Dr. Jack Quarto in Nephrology in [facility]     Chief Complaint:  Nausea, vomiting, abd pain  HPI: 31yoM with h/o HIV (reported last CD4 >500 in 05/2011), ESRD on HD (T/Th/Sat), sickle cell  anemia, HTN/DM presents with pancreatitis, hyperK.   Pt is new to our system, but is reliable historian and relates having moved to the area  recently, but still gets care from the above providers. He relates a history of HIV  diagnosed in 2003, currently controlled on ART with CD4 >500 and VL 40 in 05/2011, and  denies any h/o opportunistic infxns. He is ESRD from DM/HTN and started HD ~2009 first  through LUE fistula, now RUE and states he gets HD at home with a machine, after having  taken a 2 wk class to learn how to do it himself. Finally, states he was Dx with sickle  cell at 31yo, has had mutliple transfusions.   For the past couple days, pt has had nausea, vomiting, and epigastric pain radiating in  to his back. He typically has back and leg pain from sickle which currently is more  consistent with sickle, but epigastric pain is consistent with prior pancreatitis he got  in 2012, that he was told was due to alcohol, however he denies any alcohol use then or  even now. He denies any known h/o gallstones either. Pain was getting worse for which he  now presents.   In the ED, vitals were stable. Labs showed hypoNa 130/hypoCl 90, hyperK 5.9, renal  91/13.37 without prior baseline. Lipase 121. Anemic Hct 32.7 no baseline. Pt was given  Dilaudid 1mg  x2, Zofran 4mg  x2, 1L of NS, ordered for kayexalate and albuterol.   He is having current back and leg pain, and takes 4mg  Dilaudid usually 2-3 times per day  for this. Pt denies fevers, chills, feeling systemically ill, no SOB, chest pain. Had  minimal diarrhea over past couple days. He also relates his PCP was following a lung  mass, not sure which side, with serial imaging. ROS  otherwise negative.   Past Medical History  Diagnosis Date  . DM (diabetes mellitus)   . Sickle cell anemia     States Dx at 31 yo, s/p multiple prior transfusions, typically with pain in legs, back  . HIV (human immunodeficiency virus infection) 2003    Followed by Harless Nakayama at [facility]   . End stage renal disease on dialysis HD in 2009    Gets HD at home (?). Followed by Dr. Jack Quarto (sp?) in [place]. States due to HTN/DM. HD on T / Th / Sat  . HTN (hypertension)   . Pulmonary mass     Unclear history. States PCP was following a pulmonary mass with serial imaging  . Pancreatitis 2012    Denies any alcohol use     Past Surgical History  Procedure Date  . Dialysis fistula creation     Medications:  HOME MEDS: Reconciled fairly well with pt  Prior to Admission medications   Medication Sig Start Date End Date Taking? Authorizing Provider  abacavir-lamiVUDine (EPZICOM) 600-300 MG per tablet Take 1 tablet by mouth daily.    Yes Historical Provider, MD  b complex-vitamin c-folic acid (NEPHRO-VITE) 0.8 MG TABS Take by mouth at bedtime.   Yes Historical Provider, MD  calcium acetate (PHOSLO) 667 MG capsule Take by mouth.   Yes Historical Provider, MD  darunavir (  PREZISTA) 400 MG tablet Take 400 mg by mouth daily with breakfast.    Yes Historical Provider, MD  insulin aspart (NOVOLOG) 100 UNIT/ML injection Inject 2-6 Units into the skin 3 (three) times daily before meals.    Yes Historical Provider, MD  insulin glargine (LANTUS) 100 UNIT/ML injection Inject 8 Units into the skin daily.   Yes Historical Provider, MD  lisinopril (PRINIVIL,ZESTRIL) 5 MG tablet Take 5 mg by mouth daily.   Yes Historical Provider, MD  Nutritional Supplements (FEEDING SUPPLEMENT, NEPRO CARB STEADY,) LIQD Take by mouth.   Yes Historical Provider, MD  ritonavir (NORVIR) 100 MG capsule Take 100 mg by mouth daily.    Yes Historical Provider, MD    Allergies:  No Known Allergies  Social  History:   reports that he has been smoking Cigarettes.  He has been smoking about .1 packs per day. He has never used smokeless tobacco. He reports that he does not drink alcohol or use illicit drugs. Living with a friend, recently moved here from [place]. Both parents are deceased, but has sisters and friends as Education officer, community. Works as a Paediatric nurse.   Family History: Family History  Problem Relation Age of Onset  . Cervical cancer Mother     Dec 46yo of cervical ca  . Hypertension Father   . Diabetes      Uncles and grandfather    Physical Exam: Filed Vitals:   03/10/12 2052 03/11/12 0047  BP: 166/80 189/83  Pulse: 83 80  Temp: 98.4 F (36.9 C) 97.8 F (36.6 C)  TempSrc: Oral Oral  Resp: 18 20  Height: 5\' 7"  (1.702 m)   Weight: 74.844 kg (165 lb)   SpO2:  100%   Blood pressure 189/83, pulse 80, temperature 97.8 F (36.6 C), temperature source Oral, resp. rate 20, height 5\' 7"  (1.702 m), weight 74.844 kg (165 lb), SpO2 100.00%.  Gen: Young, large but not frankly obese M in no distress, pleasant, good historian, able  to relate history well, breathing comfortably.  HEENT: Numerous acne like nodules scattered on his face. Bearded. Pupils round and  reactive, with some scleral muddying bilaterally, EOMI, mouth moist and normal appearing.  Lungs: CTAB no w/c/r, good air movement normal exam Heart: regular, no tachycardic, but with early peaking AS type sounding murmur at BUSB's Abd: Minimall distended but not tight, no TTP except epigastrically where he endorses  subjective TTP but no facial grimacing, not overwhelming. BS+ Extrem: Warm, perfusing normally. LUE with fistula palpated but no thrill. Radials  palpable. RUE with fistula and thrill noted. No BLE edema noted Neuro: Alert, attentive, conversant, CN 2-12 intact, moves extremities well, grossly  non-focal Skin: Numerous dark nodule are scattered on his face, chest, arms, hands   Labs & Imaging Results for orders  placed during the hospital encounter of 03/10/12 (from the past 48 hour(s))  DIFFERENTIAL     Status: Normal   Collection Time   03/10/12  9:35 PM      Component Value Range Comment   Neutrophils Relative 66  43 - 77 (%)    Neutro Abs 3.0  1.7 - 7.7 (K/uL)    Lymphocytes Relative 20  12 - 46 (%)    Lymphs Abs 0.9  0.7 - 4.0 (K/uL)    Monocytes Relative 9  3 - 12 (%)    Monocytes Absolute 0.4  0.1 - 1.0 (K/uL)    Eosinophils Relative 4  0 - 5 (%)    Eosinophils Absolute 0.2  0.0 - 0.7 (K/uL)    Basophils Relative 1  0 - 1 (%)    Basophils Absolute 0.1  0.0 - 0.1 (K/uL)   COMPREHENSIVE METABOLIC PANEL     Status: Abnormal   Collection Time   03/10/12  9:35 PM      Component Value Range Comment   Sodium 130 (*) 135 - 145 (mEq/L)    Potassium 5.9 (*) 3.5 - 5.1 (mEq/L)    Chloride 90 (*) 96 - 112 (mEq/L)    CO2 20  19 - 32 (mEq/L)    Glucose, Bld 101 (*) 70 - 99 (mg/dL)    BUN 91 (*) 6 - 23 (mg/dL)    Creatinine, Ser 14.78 (*) 0.50 - 1.35 (mg/dL)    Calcium 9.2  8.4 - 10.5 (mg/dL)    Total Protein 9.0 (*) 6.0 - 8.3 (g/dL)    Albumin 3.5  3.5 - 5.2 (g/dL)    AST 12  0 - 37 (U/L)    ALT 11  0 - 53 (U/L)    Alkaline Phosphatase 104  39 - 117 (U/L)    Total Bilirubin 0.4  0.3 - 1.2 (mg/dL)    GFR calc non Af Amer 4 (*) >90 (mL/min)    GFR calc Af Amer 5 (*) >90 (mL/min)   LIPASE, BLOOD     Status: Abnormal   Collection Time   03/10/12  9:35 PM      Component Value Range Comment   Lipase 121 (*) 11 - 59 (U/L)   RETICULOCYTES     Status: Abnormal   Collection Time   03/10/12  9:35 PM      Component Value Range Comment   Retic Ct Pct 1.9  0.4 - 3.1 (%)    RBC. 3.32 (*) 4.22 - 5.81 (MIL/uL)    Retic Count, Manual 63.1  19.0 - 186.0 (K/uL)   CBC     Status: Abnormal   Collection Time   03/10/12  9:35 PM      Component Value Range Comment   WBC 4.6  4.0 - 10.5 (K/uL)    RBC 3.32 (*) 4.22 - 5.81 (MIL/uL)    Hemoglobin 11.2 (*) 13.0 - 17.0 (g/dL)    HCT 29.5 (*) 62.1 - 52.0 (%)     MCV 98.5  78.0 - 100.0 (fL)    MCH 33.7  26.0 - 34.0 (pg)    MCHC 34.3  30.0 - 36.0 (g/dL)    RDW 30.8  65.7 - 84.6 (%)    Platelets 197  150 - 400 (K/uL)    Dg Chest 1 View  03/11/2012  *RADIOLOGY REPORT*  Clinical Data: Nausea, vomiting, abdominal pain, sickle cell disease, diabetes, HIV  CHEST - 1 VIEW  Comparison: Portable exam 0027 hours without priors for comparison.  Findings: Enlargement of cardiac silhouette. Pulmonary vascular congestion. Mediastinal contours normal. Right lower lobe infiltrate, likely right lower lobe. Remaining lungs grossly clear. No pleural effusion or pneumothorax. No acute osseous findings. Vascular stents identified at left subclavian region through the mid upper left arm.  IMPRESSION: Enlargement of cardiac silhouette with pulmonary vascular congestion. Left basilar infiltrate consistent with pneumonia.  Original Report Authenticated By: Lollie Marrow, M.D.    ECG: NSR 81 bpm, normal axis, normal P and PR, narrow QRS, Q waves V1-2 (vs ditzel R's),  hyperdynamic appearing T waves V2-4. Lateral/high lateral TWF.    Impression Present on Admission:  .End stage renal disease on dialysis .DM (diabetes mellitus) .HIV (human immunodeficiency  virus infection) .HTN (hypertension) .Pulmonary mass .Sickle cell anemia .Pancreatitis .Hyperkalemia .Hyponatremia .Hypochloremia   31yoM with h/o HIV (reported last CD4 >500 in 05/2011), ESRD on HD (T/Th/Sat), sickle cell  anemia, HTN/DM presents with pancreatitis, hyperK.   1. Pancreatitis: He adamantly denies alcohol and seems reliable. Denies h/o gallstones,  need to ultrasound. Is taking HIV meds/ritonavir protease inhibitor.  - NPO, can advance to clears as tolerated. Pt is now s/p 1L of NS and is ESRD/anuric,  will hold on further IVF's for now.  - Pain control, will give 2mg  Dilaudid q3 given home baseline PO dilaudid use, do not get  the sense of narcotic seeking. Start orals when tolerating  - RUQ  ultrasound for gallstones, continue HIV meds for now  2. HyperK: Not bad at this point, but does have some T wave peaking.  - Nebs scheduled, kayexalate, hold on further IVF's overnight as above.   3. HIV: continue home epzicom, prezista, norvir. Consider getting intpatient ID  consultation for ? HIV meds and pancreatitis. Defer getting CD4 at this point, wouldn't  change overnight management  4. Acute on chronic vs chronic renal failure: Cr quite high, but no known baseline, and  pt endorses compliance with HD (that he states he does at home). Trending for now, will  contact renal in the am, consider calling pt's nephrologist for baseline values.  - Continue home renavite, phoslo   5. HypoNa/hypoCl: Suspect due to either poor PO intake from pancreatitis or ESRD related.   6. DM: continue home insulin regimen  7. HTN: continue home lisinopril  8. Abnormal CXR: Pneumonia seen on CXR tonight is likely the mass to which he was referring. Would not chase this down aggressively at this point. Consider discussing with his PCP.   SubQ heparin DVT prophy  Telemetry for hyperK, [facility] team 1 Presumed full code   Yarianna Varble 03/11/2012, 1:21 AM

## 2012-03-11 NOTE — ED Notes (Signed)
Admitting MD at bedside.

## 2012-03-11 NOTE — Progress Notes (Signed)
ALERT ALERT: I was called tonight as I was covering the floor call at [facility] about [name]'s K of 6.9.  I ordered an EKG, and reviewed his chart.  It was almost identical to another patient I admitted at [facility] under the name of [name].  He was admitted for Pancreatitis and CRF on dialysis.  He was exhibiting pain seeking behavior and was givng inconsistent stories.  He left AMA from [facility], and went to [facility], and Dr Kaylyn Layer admitted him.  He did not tell Dr Kaylyn Layer about his admission to [facility], and again asking for pain medication.  I was concerned that it may have been the same patient and I came to see him in his room.  It was indeed the same person.  He was using his first name at [facility] and middle name at [facility].   When I confronted him about this, as it seems that he is seeking different ways of obtaining narcotics under different names and giving incomplete medical history, he became very upset and left AMA in a hurry. Before he left, I did tell him that his elevated K can kill him, but he just left very very quickly and refuses to let the RN even doing an EKG.     Houston Siren, MD.

## 2012-03-12 ENCOUNTER — Inpatient Hospital Stay: Payer: Self-pay | Admitting: Internal Medicine

## 2012-03-12 LAB — COMPREHENSIVE METABOLIC PANEL
Anion Gap: 18 — ABNORMAL HIGH (ref 7–16)
BUN: 110 mg/dL — ABNORMAL HIGH (ref 7–18)
Bilirubin,Total: 0.6 mg/dL (ref 0.2–1.0)
Calcium, Total: 8.6 mg/dL (ref 8.5–10.1)
Chloride: 97 mmol/L — ABNORMAL LOW (ref 98–107)
Co2: 18 mmol/L — ABNORMAL LOW (ref 21–32)
Creatinine: 16.51 mg/dL — ABNORMAL HIGH (ref 0.60–1.30)
EGFR (African American): 4 — ABNORMAL LOW
EGFR (Non-African Amer.): 4 — ABNORMAL LOW
Osmolality: 302 (ref 275–301)
Potassium: 7.1 mmol/L (ref 3.5–5.1)
SGOT(AST): 16 U/L (ref 15–37)
SGPT (ALT): 16 U/L
Sodium: 133 mmol/L — ABNORMAL LOW (ref 136–145)
Total Protein: 9.4 g/dL — ABNORMAL HIGH (ref 6.4–8.2)

## 2012-03-12 LAB — LIPASE, BLOOD: Lipase: 422 U/L — ABNORMAL HIGH (ref 73–393)

## 2012-03-12 LAB — CBC
HGB: 12.2 g/dL — ABNORMAL LOW (ref 13.0–18.0)
MCH: 34.1 pg — ABNORMAL HIGH (ref 26.0–34.0)
MCHC: 33.6 g/dL (ref 32.0–36.0)
MCV: 101 fL — ABNORMAL HIGH (ref 80–100)
Platelet: 185 10*3/uL (ref 150–440)
RBC: 3.57 10*6/uL — ABNORMAL LOW (ref 4.40–5.90)
RDW: 14.9 % — ABNORMAL HIGH (ref 11.5–14.5)

## 2012-03-12 LAB — TROPONIN I: Troponin-I: <0.02 ng/mL

## 2012-03-12 LAB — FOLATE: Folic Acid: 27.2 ng/mL (ref 3.1–100.0)

## 2012-03-12 LAB — RETICULOCYTES: Reticulocyte: 0.7 % (ref 0.5–1.5)

## 2012-03-12 LAB — HEMOGLOBIN A1C: Hemoglobin A1C: 5.2 % (ref 4.2–6.3)

## 2012-03-12 NOTE — Discharge Summary (Signed)
Discharge Summary  Beldon Nowling MR#: [id number]  DOB:[birth year]  Date of Admission: 03/09/2012 Date of Discharge: 03/12/2012  Patient's PCP: No primary provider on file.  Attending Physician:Shanice Poznanski  Consults: Treatment Team:  Maree Krabbe, MD   Discharge Diagnoses: Active Problems:  Pancreatitis, acute  ESRD on dialysis  Sickle cell trait  DM (diabetes mellitus)  HTN (hypertension)  HIV (human immunodeficiency virus infection)  Noncompliance of patient with renal dialysis  History of noncompliance with medical treatment   Brief Admitting History and Physical [name] is an 31 y.o. male with history of HIV positive, diabetes, end-stage renal disease on hemodialysis (2 status there is Saturday), hypertension, sickle cell anemia, presents to emergency room complaining of epigastric discomfort, nausea, vomiting, and loose stools. His previous physician was from [place] area, and he has just moved to this area 2 weeks ago. He has history of pancreatitis previously, and no clear etiology was given to him. He denied any fever, chills, alcohol consumption, drug use, black stool or bloody stool. He has no shortness of breath, chest pain or any cough. Workup in the emergency room included a normal white count 3.7 thousand, lipase elevation of 1.9, creatinine of 11.9 and BUN of 82 with potassium of 5.7. An abdominal pelvic CT showed normal pancreas, but he has infiltrate in his lung, consistent with either pneumonia or a mass, recommended followed to resolution. He also has kidney cysts seen by the CT along with pulmonary nodules as well. Hospitalist was asked to admit patient for treatment of pancreatitis.   Discharge Medications Medication List  As of 03/12/2012  3:35 PM   ASK your doctor about these medications         abacavir-lamiVUDine 600-300 MG per tablet   Commonly known as: EPZICOM   Take 1 tablet by mouth daily.      b complex-vitamin c-folic acid 0.8 MG  Tabs   Take 0.8 mg by mouth at bedtime.      darunavir 400 MG tablet   Commonly known as: PREZISTA   Take 800 mg by mouth daily with breakfast.      insulin aspart 100 UNIT/ML injection   Commonly known as: novoLOG   Inject 2-6 Units into the skin 3 (three) times daily before meals. Per sliding scale      insulin glargine 100 UNIT/ML injection   Commonly known as: LANTUS   Inject 8 Units into the skin.      lisinopril 5 MG tablet   Commonly known as: PRINIVIL,ZESTRIL   Take 5 mg by mouth daily.      ritonavir 100 MG capsule   Commonly known as: NORVIR   Take 100 mg by mouth 2 (two) times daily.            Hospital Course: Pancreatitis, acute- pain meds, IVF, U/S negative for GB disease, related to HIV medications  ESRD on dialysis- continue HD  Sickle cell trait  DM (diabetes mellitus)- BS low as patient not eating will need to start lantus soon  HTN (hypertension)  HIV (human immunodeficiency virus infection)- needs to have follow up here in   PCP in [place]  N/V- related to pancreatitis  ADDENDUM:  Records received from his home dialysis center include d/c summaries from the following hospitals; full d/c summaries available in the shadow chart  [facility] discharged 03/07/12 after a one day admission; his discharge diagnosis was N, V and D which may have been caused by a GI virus plus he has gastroparesis. His  HIV meds were not continued at discharge due to intolderance and he was to follo up with the ID clinic there. He as given pain meds enough to hold him over until he could be seen at the IM clinic for referral to the [facility] (Rx dilaudid 4 mg q 6 hr); Abdominal CT at that time showed patchy bibasilar groundglass opacities which could represent PNA or asp and noted 7 mm pul nodule;with recommeded followup for resolution.. A Labs from 3/18 in [place] were pertinent for GGT 90 (AST and ALT ok) P 9.8 and Alb 4.5 Ca 9.8.  [facility] 2/10- 2/15 2/013 for abdominal  pain, nausea and vomiting -  [facility] 1/15 - 01/05/12 for abdominal pain, nausea and vomiting felt secondary to drug seeking behavior. His d/c summary showed a left lung mass of uncertain etiology  [facility] 12/09 - 12/01/2011 abdominal pain with nausea and vomiting; no clinical evidence to support acute or chronic pancreatitis; hx noncompliance - see shadow chart for details    Day of Discharge BP 147/81  Pulse 94  Temp(Src) 97 F (36.1 C) (Oral)  Resp 19  Ht 5\' 7"  (1.702 m)  Wt 82.3 kg (181 lb 7 oz)  BMI 28.42 kg/m2  SpO2 100% Left AMA without notifing anyone  Results for orders placed during the hospital encounter of 03/09/12 (from the past 48 hour(s))  GLUCOSE, CAPILLARY     Status: Normal   Collection Time   03/10/12  5:07 PM      Component Value Range Comment   Glucose-Capillary 70  70 - 99 (mg/dL)     US Abdomen Complete  03/10/2012  *RADIOLOGY REPORT*  Clinical Data:  Abdominal pain, question biliary disease, history hypertension, diabetes, sickle cell disease, renal failure  ULTRASOUND ABDOMEN:  Technique:  Sonography of upper abdominal structures was performed.  Comparison:  None  Gallbladder:  Normally distended without stones or wall thickening. No pericholecystic fluid or sonographic Murphy sign.  Common bile duct:  Normal caliber, 4 mm diameter  Liver:  Echogenic, likely fatty infiltration, though this can be seen with cirrhosis and certain infiltrative disorders.  No focal hepatic mass or nodularity  IVC:  Normal appearance  Pancreas:  Normal appearance  Spleen:  Normal morphology, slightly prominent in size at 13.7 cm length  Right kidney:  8.9 cm in length.  Cortical thinning and increased cortical echogenicity.  Atrophic appearance without hydronephrosis. Small complicated nodule 12 x 11 x 12 mm in size and mid right kidney, incompletely characterized.  Left kidney:  9.6 cm length.  Cortical thinning and increased cortical echogenicity.  No mass or  hydronephrosis.  Aorta:  Normal caliber  Other:  No free fluid  IMPRESSION: Probable fatty infiltration of the liver. No evidence of gallbladder disease. Atrophic kidneys with medical renal disease changes. Complicated nodule right kidney 12 x 11 x 12 mm in size, question complicated cystic versus solid lesion. Recommend follow-up non emergent noncontrast MR imaging to characterize (noncontrast due to history of renal failure).  Original Report Authenticated By: Lollie Marrow, M.D.   Ct Abdomen Pelvis W Contrast  03/10/2012  *RADIOLOGY REPORT*  Clinical Data: Right abdominal to mid back pain, nausea, vomiting, diarrhea  CT ABDOMEN AND PELVIS WITH CONTRAST  Technique:  Multidetector CT imaging of the abdomen and pelvis was performed following the standard protocol during bolus administration of intravenous contrast. The patient vomited the majority of the ingested oral contrast.  Sagittal and coronal MPR images reconstructed from axial  data set.  Contrast:  80 ml Omnipaque 300 IV.  No significant GI contrast remains.  Comparison: None  Findings: Left lower lobe infiltrate with additional area of more focal opacity at the left lower lobe which may represent additional consolidation or less likely mass. Question 7 mm nodular density right lower lobe. Minimal atelectasis or infiltrate right lung base. Spleen appears mildly enlarged 15.4 x 9.6 x 8.6 cm. No focal abnormalities of the liver, spleen, pancreas, or adrenal glands. Bilateral atrophic kidneys with intermediate to low attenuation foci in both kidneys which may represent simple or complicated cyst, small solid nodules not excluded. Extensive atherosclerotic calcifications.  Bladder decompressed. Questionable rectal wall thickening versus artifact from underdistension. Stomach and bowel loops are suboptimally evaluated due to the lack of GI contrast and inadequate distention. No definite bowel dilatation or bowel obstruction. No gross evidence of mass or  adenopathy identified on limited assessment.  Marked destruction of the right hip joint with bony debris, synovial thickening and question small joint effusion. Significant bony resorption identified at the SI joints bilaterally. No additional focal bony abnormalities seen.  IMPRESSION: Left lower lobe infiltrate with additional more focal left lower lobe opacity, likely infiltrate but mass not completely excluded; follow-up until clearing is required to exclude underlying mass. Splenomegaly. Atrophic kidneys with multiple small bilateral low to intermediate attenuation lesions, potentially simple of complicated cyst though small solid nodules not completely excluded; a single nodule within the right kidney on ultrasound was noted, mildly complicated, recommend either confirmation of stability by prior outside studies or characterization by MR imaging to exclude small solid lesion. Question rectal wall thickening versus underdistension artifact, proctitis not excluded. Marked destructive changes of the right hip joint. Bilateral sacroiliitis. 7 mm left lower lobe pulmonary nodule, recommendation below. If the patient is at high risk for bronchogenic carcinoma, follow- up chest CT at 3-6 months is recommended.  If the patient is at low risk for bronchogenic carcinoma, follow-up chest CT at 6-12 months is recommended.  This recommendation follows the consensus statement: Guidelines for Management of Small Pulmonary Nodules Detected on CT Scans: A Statement from the Fleischner Society as published in Radiology 2005; 237:395-400.  Original Report Authenticated By: Lollie Marrow, M.D.     LEFT AMA  Time spent on discharge, talking to the patient, and coordinating care: .   SignedMarlin Canary, DO 03/12/2012, 3:35 PM

## 2012-03-13 LAB — COMPREHENSIVE METABOLIC PANEL
Anion Gap: 21 — ABNORMAL HIGH (ref 7–16)
Calcium, Total: 8.1 mg/dL — ABNORMAL LOW (ref 8.5–10.1)
Chloride: 101 mmol/L (ref 98–107)
Co2: 15 mmol/L — ABNORMAL LOW (ref 21–32)
Glucose: 125 mg/dL — ABNORMAL HIGH (ref 65–99)
Osmolality: 312 (ref 275–301)
SGOT(AST): 13 U/L — ABNORMAL LOW (ref 15–37)
Total Protein: 8.4 g/dL — ABNORMAL HIGH (ref 6.4–8.2)

## 2012-03-13 LAB — CBC WITH DIFFERENTIAL/PLATELET
Basophil #: 0 10*3/uL (ref 0.0–0.1)
Basophil %: 0.9 %
Eosinophil #: 0.2 10*3/uL (ref 0.0–0.7)
HCT: 29.7 % — ABNORMAL LOW (ref 40.0–52.0)
Lymphocyte #: 0.7 10*3/uL — ABNORMAL LOW (ref 1.0–3.6)
MCHC: 33.4 g/dL (ref 32.0–36.0)
MCV: 102 fL — ABNORMAL HIGH (ref 80–100)
Monocyte #: 0.4 10*3/uL (ref 0.0–0.7)
Neutrophil %: 62.4 %
Platelet: 169 10*3/uL (ref 150–440)

## 2012-03-13 LAB — POTASSIUM: Potassium: 6.8 mmol/L (ref 3.5–5.1)

## 2012-03-13 LAB — LIPID PANEL
Cholesterol: 130 mg/dL (ref 0–200)
HDL Cholesterol: 36 mg/dL — ABNORMAL LOW (ref 40–60)
Ldl Cholesterol, Calc: 84 mg/dL (ref 0–100)
Triglycerides: 48 mg/dL (ref 0–200)
VLDL Cholesterol, Calc: 10 mg/dL (ref 5–40)

## 2012-03-13 LAB — PHOSPHORUS: Phosphorus: 9.2 mg/dL — ABNORMAL HIGH (ref 2.5–4.9)

## 2012-03-13 LAB — LIPASE, BLOOD: Lipase: 455 U/L — ABNORMAL HIGH (ref 73–393)

## 2012-03-14 LAB — CBC WITH DIFFERENTIAL/PLATELET
Basophil #: 0 10*3/uL (ref 0.0–0.1)
Eosinophil %: 4.1 %
HCT: 30.5 % — ABNORMAL LOW (ref 40.0–52.0)
HGB: 10.1 g/dL — ABNORMAL LOW (ref 13.0–18.0)
Lymphocyte %: 13 %
MCH: 33.4 pg (ref 26.0–34.0)
MCHC: 32.9 g/dL (ref 32.0–36.0)
MCV: 101 fL — ABNORMAL HIGH (ref 80–100)
Monocyte #: 0.5 10*3/uL (ref 0.0–0.7)
Monocyte %: 13.7 %
Neutrophil %: 68.8 %
RDW: 14 % (ref 11.5–14.5)

## 2012-03-14 LAB — BASIC METABOLIC PANEL
Anion Gap: 14 (ref 7–16)
BUN: 62 mg/dL — ABNORMAL HIGH (ref 7–18)
Calcium, Total: 8.5 mg/dL (ref 8.5–10.1)
EGFR (African American): 6 — ABNORMAL LOW
EGFR (Non-African Amer.): 5 — ABNORMAL LOW
Osmolality: 283 (ref 275–301)

## 2012-03-14 LAB — LIPASE, BLOOD: Lipase: 731 U/L — ABNORMAL HIGH (ref 73–393)

## 2012-03-15 LAB — CBC WITH DIFFERENTIAL/PLATELET
Basophil #: 0 10*3/uL (ref 0.0–0.1)
Eosinophil %: 7.1 %
Lymphocyte %: 21.7 %
MCH: 33.5 pg (ref 26.0–34.0)
Monocyte %: 18.6 %
Neutrophil #: 1.7 10*3/uL (ref 1.4–6.5)
Neutrophil %: 52.1 %
RBC: 2.85 10*6/uL — ABNORMAL LOW (ref 4.40–5.90)
RDW: 13.8 % (ref 11.5–14.5)

## 2012-03-15 LAB — LIPASE, BLOOD: Lipase: 324 U/L (ref 73–393)

## 2012-03-15 LAB — PHOSPHORUS: Phosphorus: 8.4 mg/dL — ABNORMAL HIGH (ref 2.5–4.9)

## 2012-03-17 LAB — CBC WITH DIFFERENTIAL/PLATELET
Basophil #: 0 10*3/uL (ref 0.0–0.1)
Basophil %: 0.5 %
Eosinophil #: 0.3 10*3/uL (ref 0.0–0.7)
Eosinophil %: 7.1 %
HGB: 9.3 g/dL — ABNORMAL LOW (ref 13.0–18.0)
Lymphocyte #: 0.4 10*3/uL — ABNORMAL LOW (ref 1.0–3.6)
Lymphocyte %: 11.2 %
MCH: 33.2 pg (ref 26.0–34.0)
MCHC: 32.6 g/dL (ref 32.0–36.0)
MCV: 102 fL — ABNORMAL HIGH (ref 80–100)
Monocyte %: 14.2 %
Neutrophil %: 67 %
Platelet: 179 10*3/uL (ref 150–440)
RBC: 2.8 10*6/uL — ABNORMAL LOW (ref 4.40–5.90)
RDW: 13.6 % (ref 11.5–14.5)

## 2012-03-17 LAB — RENAL FUNCTION PANEL
Calcium, Total: 8.3 mg/dL — ABNORMAL LOW (ref 8.5–10.1)
Chloride: 87 mmol/L — ABNORMAL LOW (ref 98–107)
Co2: 34 mmol/L — ABNORMAL HIGH (ref 21–32)
Osmolality: 274 (ref 275–301)
Phosphorus: 7.8 mg/dL — ABNORMAL HIGH (ref 2.5–4.9)
Sodium: 132 mmol/L — ABNORMAL LOW (ref 136–145)

## 2012-03-17 LAB — LIPASE, BLOOD: Lipase: 123 U/L (ref 73–393)

## 2012-03-20 NOTE — Discharge Summary (Signed)
ALERT ALERT:  I was called tonight as I was covering the floor call at [facility] about [name]'s K of 6.9. I ordered an EKG, and reviewed his chart. It was almost identical to another patient I admitted at [facility] under the name of [name]. He was admitted for Pancreatitis and CRF on dialysis. He was exhibiting pain seeking behavior and was givng inconsistent stories. He left AMA from [facility], and went to [facility], and Dr Kaylyn Layer admitted him. He did not tell Dr Kaylyn Layer about his admission to [facility], and again asking for pain medication. I was concerned that it may have been the same patient and I came to see him in his room. It was indeed the same person. He was using his first name at [facility] and middle name at [facility]. When I confronted him about this, as it seems that he is seeking different ways of obtaining narcotics under different names and giving incomplete medical history, he became very upset and left AMA in a hurry.  Before he left, I did tell him that his elevated K can kill him, but he just left very very quickly and refuses to let the RN even doing an EKG. Houston Siren, MD.

## 2012-05-04 ENCOUNTER — Inpatient Hospital Stay: Payer: Self-pay | Admitting: Internal Medicine

## 2012-05-04 LAB — COMPREHENSIVE METABOLIC PANEL
Alkaline Phosphatase: 113 U/L (ref 50–136)
Anion Gap: 15 (ref 7–16)
Calcium, Total: 8.4 mg/dL — ABNORMAL LOW (ref 8.5–10.1)
Chloride: 99 mmol/L (ref 98–107)
Co2: 20 mmol/L — ABNORMAL LOW (ref 21–32)
Creatinine: 11.56 mg/dL — ABNORMAL HIGH (ref 0.60–1.30)
EGFR (Non-African Amer.): 5 — ABNORMAL LOW
Potassium: 5.9 mmol/L — ABNORMAL HIGH (ref 3.5–5.1)
SGOT(AST): 23 U/L (ref 15–37)

## 2012-05-04 LAB — CBC
HCT: 25.2 % — ABNORMAL LOW (ref 40.0–52.0)
HGB: 8.3 g/dL — ABNORMAL LOW (ref 13.0–18.0)
MCH: 33.8 pg (ref 26.0–34.0)
MCHC: 33 g/dL (ref 32.0–36.0)
MCV: 102 fL — ABNORMAL HIGH (ref 80–100)
Platelet: 165 10*3/uL (ref 150–440)
RBC: 2.46 10*6/uL — ABNORMAL LOW (ref 4.40–5.90)

## 2012-05-04 LAB — LIPASE, BLOOD: Lipase: 214 U/L (ref 73–393)

## 2012-05-04 LAB — RETICULOCYTES
Absolute Retic Count: 0.0531 10*6/uL (ref 0.024–0.084)
Reticulocyte: 2.16 % — ABNORMAL HIGH (ref 0.5–1.5)

## 2012-05-05 LAB — CK-MB
CK-MB: 1.6 ng/mL (ref 0.5–3.6)
CK-MB: 1.7 ng/mL (ref 0.5–3.6)

## 2012-05-05 LAB — RENAL FUNCTION PANEL
Albumin: 3 g/dL — ABNORMAL LOW (ref 3.4–5.0)
Anion Gap: 9 (ref 7–16)
BUN: 46 mg/dL — ABNORMAL HIGH (ref 7–18)
Chloride: 99 mmol/L (ref 98–107)
Co2: 27 mmol/L (ref 21–32)
EGFR (Non-African Amer.): 9 — ABNORMAL LOW
Glucose: 132 mg/dL — ABNORMAL HIGH (ref 65–99)
Osmolality: 284 (ref 275–301)
Phosphorus: 4.3 mg/dL (ref 2.5–4.9)
Potassium: 4.3 mmol/L (ref 3.5–5.1)

## 2012-05-05 LAB — CBC WITH DIFFERENTIAL/PLATELET
Basophil #: 0 10*3/uL (ref 0.0–0.1)
Basophil %: 0.4 %
Eosinophil #: 0.2 10*3/uL (ref 0.0–0.7)
Eosinophil %: 5.6 %
HCT: 24.6 % — ABNORMAL LOW (ref 40.0–52.0)
MCH: 33.4 pg (ref 26.0–34.0)
MCHC: 32.7 g/dL (ref 32.0–36.0)
MCV: 102 fL — ABNORMAL HIGH (ref 80–100)
Monocyte %: 11.7 %
Neutrophil #: 2.7 10*3/uL (ref 1.4–6.5)
Platelet: 162 10*3/uL (ref 150–440)
RBC: 2.41 10*6/uL — ABNORMAL LOW (ref 4.40–5.90)
WBC: 3.7 10*3/uL — ABNORMAL LOW (ref 3.8–10.6)

## 2012-05-05 LAB — CK: CK, Total: 107 U/L (ref 35–232)

## 2012-05-05 LAB — COMPREHENSIVE METABOLIC PANEL
Albumin: 3.1 g/dL — ABNORMAL LOW (ref 3.4–5.0)
BUN: 81 mg/dL — ABNORMAL HIGH (ref 7–18)
Calcium, Total: 8.1 mg/dL — ABNORMAL LOW (ref 8.5–10.1)
Creatinine: 11.67 mg/dL — ABNORMAL HIGH (ref 0.60–1.30)
EGFR (African American): 6 — ABNORMAL LOW
EGFR (Non-African Amer.): 5 — ABNORMAL LOW
Glucose: 116 mg/dL — ABNORMAL HIGH (ref 65–99)
Osmolality: 292 (ref 275–301)
SGPT (ALT): 22 U/L

## 2012-05-05 LAB — PROTIME-INR
INR: 1
Prothrombin Time: 13.3 secs (ref 11.5–14.7)

## 2012-05-05 LAB — TROPONIN I: Troponin-I: 0.03 ng/mL

## 2012-05-07 LAB — CBC WITH DIFFERENTIAL/PLATELET
Basophil #: 0 10*3/uL (ref 0.0–0.1)
Basophil %: 0.8 %
Eosinophil #: 0.4 10*3/uL (ref 0.0–0.7)
Eosinophil %: 16.1 %
HCT: 23.4 % — ABNORMAL LOW (ref 40.0–52.0)
Lymphocyte #: 0.4 10*3/uL — ABNORMAL LOW (ref 1.0–3.6)
MCH: 33.7 pg (ref 26.0–34.0)
MCHC: 33.6 g/dL (ref 32.0–36.0)
MCV: 100 fL (ref 80–100)
Monocyte #: 0.5 x10 3/mm (ref 0.2–1.0)
Monocyte %: 19.9 %
RBC: 2.33 10*6/uL — ABNORMAL LOW (ref 4.40–5.90)
RDW: 15.2 % — ABNORMAL HIGH (ref 11.5–14.5)
WBC: 2.7 10*3/uL — ABNORMAL LOW (ref 3.8–10.6)

## 2012-05-07 LAB — BASIC METABOLIC PANEL
Anion Gap: 14 (ref 7–16)
BUN: 56 mg/dL — ABNORMAL HIGH (ref 7–18)
Calcium, Total: 8.3 mg/dL — ABNORMAL LOW (ref 8.5–10.1)
Creatinine: 9.74 mg/dL — ABNORMAL HIGH (ref 0.60–1.30)
EGFR (African American): 7 — ABNORMAL LOW
Glucose: 84 mg/dL (ref 65–99)
Osmolality: 275 (ref 275–301)
Potassium: 5.6 mmol/L — ABNORMAL HIGH (ref 3.5–5.1)
Sodium: 130 mmol/L — ABNORMAL LOW (ref 136–145)

## 2012-05-07 LAB — LIPID PANEL
Cholesterol: 108 mg/dL (ref 0–200)
HDL Cholesterol: 30 mg/dL — ABNORMAL LOW (ref 40–60)
Triglycerides: 64 mg/dL (ref 0–200)
VLDL Cholesterol, Calc: 13 mg/dL (ref 5–40)

## 2012-05-07 LAB — HEMOGLOBIN A1C: Hemoglobin A1C: <3.5 % — ABNORMAL LOW (ref 4.2–6.3)

## 2012-05-08 LAB — BASIC METABOLIC PANEL
Anion Gap: 14 (ref 7–16)
Calcium, Total: 8.4 mg/dL — ABNORMAL LOW (ref 8.5–10.1)
Creatinine: 11.33 mg/dL — ABNORMAL HIGH (ref 0.60–1.30)
EGFR (African American): 6 — ABNORMAL LOW
EGFR (Non-African Amer.): 5 — ABNORMAL LOW
Glucose: 75 mg/dL (ref 65–99)
Osmolality: 281 (ref 275–301)
Potassium: 5.6 mmol/L — ABNORMAL HIGH (ref 3.5–5.1)
Sodium: 133 mmol/L — ABNORMAL LOW (ref 136–145)

## 2012-05-08 LAB — CBC WITH DIFFERENTIAL/PLATELET
Basophil %: 0.7 %
Eosinophil %: 13.9 %
HCT: 24.5 % — ABNORMAL LOW (ref 40.0–52.0)
HGB: 8.1 g/dL — ABNORMAL LOW (ref 13.0–18.0)
Lymphocyte #: 0.5 10*3/uL — ABNORMAL LOW (ref 1.0–3.6)
Lymphocyte %: 15.7 %
MCH: 33.2 pg (ref 26.0–34.0)
MCHC: 33.3 g/dL (ref 32.0–36.0)
MCV: 100 fL (ref 80–100)
Monocyte #: 0.5 x10 3/mm (ref 0.2–1.0)
Monocyte %: 13.9 %
Neutrophil #: 1.8 10*3/uL (ref 1.4–6.5)
Platelet: 126 10*3/uL — ABNORMAL LOW (ref 150–440)
RBC: 2.45 10*6/uL — ABNORMAL LOW (ref 4.40–5.90)
RDW: 15.1 % — ABNORMAL HIGH (ref 11.5–14.5)
WBC: 3.3 10*3/uL — ABNORMAL LOW (ref 3.8–10.6)

## 2012-05-08 LAB — PHOSPHORUS: Phosphorus: 9 mg/dL — ABNORMAL HIGH (ref 2.5–4.9)

## 2012-05-09 LAB — CBC WITH DIFFERENTIAL/PLATELET
Basophil %: 0.9 %
Eosinophil #: 0.3 10*3/uL (ref 0.0–0.7)
Lymphocyte #: 0.6 10*3/uL — ABNORMAL LOW (ref 1.0–3.6)
Lymphocyte %: 21.8 %
MCH: 33.1 pg (ref 26.0–34.0)
MCV: 101 fL — ABNORMAL HIGH (ref 80–100)
Monocyte #: 0.4 x10 3/mm (ref 0.2–1.0)
Monocyte %: 14.4 %
Neutrophil %: 50.9 %
Platelet: 134 10*3/uL — ABNORMAL LOW (ref 150–440)
RBC: 2.69 10*6/uL — ABNORMAL LOW (ref 4.40–5.90)
WBC: 2.7 10*3/uL — ABNORMAL LOW (ref 3.8–10.6)

## 2012-05-09 LAB — BASIC METABOLIC PANEL
Anion Gap: 11 (ref 7–16)
Calcium, Total: 9.2 mg/dL (ref 8.5–10.1)
EGFR (African American): 10 — ABNORMAL LOW
EGFR (Non-African Amer.): 8 — ABNORMAL LOW
Osmolality: 267 (ref 275–301)

## 2012-05-10 LAB — CULTURE, BLOOD (SINGLE)

## 2012-05-24 ENCOUNTER — Inpatient Hospital Stay
Admit: 2012-05-24 | Discharge: 2012-06-12 | Disposition: A | Discharge status: Home / Self Care | Payer: MEDICARE | Attending: Internal Medicine | Admitting: Internal Medicine

## 2012-05-24 DIAGNOSIS — J189 Pneumonia, unspecified organism: Secondary | ICD-10-CM

## 2012-05-24 LAB — METABOLIC PANEL, COMPREHENSIVE
A-G Ratio: 0.5 — ABNORMAL LOW (ref 1.1–2.2)
ALT (SGPT): 33 U/L (ref 12–78)
AST (SGOT): 27 U/L (ref 15–37)
Albumin: 3.3 g/dL — ABNORMAL LOW (ref 3.5–5.0)
Alk. phosphatase: 106 U/L (ref 50–136)
Anion gap: 12 mmol/L (ref 5–15)
BUN/Creatinine ratio: 6 — ABNORMAL LOW (ref 12–20)
BUN: 33 MG/DL — ABNORMAL HIGH (ref 6–20)
Bilirubin, total: 0.5 MG/DL (ref 0.2–1.0)
CO2: 28 MMOL/L (ref 21–32)
Calcium: 9.1 MG/DL (ref 8.5–10.1)
Chloride: 94 MMOL/L — ABNORMAL LOW (ref 97–108)
Creatinine: 6 MG/DL — ABNORMAL HIGH (ref 0.45–1.15)
GFR est AA: 13 mL/min/{1.73_m2} — ABNORMAL LOW (ref >60)
GFR est non-AA: 11 mL/min/{1.73_m2} — ABNORMAL LOW (ref >60)
Globulin: 6.6 g/dL — ABNORMAL HIGH (ref 2.0–4.0)
Glucose: 116 MG/DL — ABNORMAL HIGH (ref 65–100)
Potassium: 4.1 MMOL/L (ref 3.5–5.1)
Protein, total: 9.9 g/dL — ABNORMAL HIGH (ref 6.4–8.2)
Sodium: 134 MMOL/L — ABNORMAL LOW (ref 136–145)

## 2012-05-24 LAB — CBC WITH AUTOMATED DIFF
ABS. BASOPHILS: 0 10*3/uL (ref 0.0–0.1)
ABS. EOSINOPHILS: 0 10*3/uL (ref 0.0–0.4)
ABS. LYMPHOCYTES: 0.3 10*3/uL — ABNORMAL LOW (ref 0.8–3.5)
ABS. MONOCYTES: 0.4 10*3/uL (ref 0.0–1.0)
ABS. NEUTROPHILS: 2.2 10*3/uL (ref 1.8–8.0)
BASOPHILS: 0 % (ref 0–1)
EOSINOPHILS: 1 % (ref 0–7)
HCT: 24.6 % — ABNORMAL LOW (ref 36.6–50.3)
HGB: 7.9 g/dL — ABNORMAL LOW (ref 12.1–17.0)
LYMPHOCYTES: 12 % (ref 12–49)
MCH: 31.2 PG (ref 26.0–34.0)
MCHC: 32.1 g/dL (ref 30.0–36.5)
MCV: 97.2 FL (ref 80.0–99.0)
MONOCYTES: 15 % — ABNORMAL HIGH (ref 5–13)
NEUTROPHILS: 72 % (ref 32–75)
PLATELET: 183 10*3/uL (ref 150–400)
RBC: 2.53 M/uL — ABNORMAL LOW (ref 4.10–5.70)
RDW: 14.8 % — ABNORMAL HIGH (ref 11.5–14.5)
WBC: 2.9 10*3/uL — ABNORMAL LOW (ref 4.1–11.1)

## 2012-05-24 LAB — RETICULOCYTE COUNT: Reticulocyte count: 1.3 % (ref 0.7–2.1)

## 2012-05-24 LAB — LACTIC ACID: Lactic acid: 0.8 MMOL/L (ref 0.4–2.0)

## 2012-05-24 MED ORDER — B COMPLEX-VITAMIN C-FOLIC ACID 1 MG CAP
1 mg | Freq: Every day | ORAL | Status: DC
Start: 2012-05-24 — End: 2012-06-12
  Administered 2012-05-24 – 2012-06-12 (×20): via ORAL

## 2012-05-24 MED ADMIN — HYDROmorphone (PF) (DILAUDID) injection 2 mg: INTRAVENOUS | @ 12:00:00 | NDC 00409255201

## 2012-05-24 MED ADMIN — piperacillin-tazobactam (ZOSYN) 2.25 g in 0.9% sodium chloride (MBP/ADV) 50 mL MBP: INTRAVENOUS | @ 14:00:00 | NDC 81284015110

## 2012-05-24 MED ADMIN — HYDROmorphone (PF) 15 mg/30 ml (DILAUDID) PCA: INTRAVENOUS | @ 17:00:00 | NDC 02420030164

## 2012-05-24 MED ADMIN — HYDROmorphone (PF) 15 mg/30 ml (DILAUDID) PCA: INTRAVENOUS | @ 20:00:00 | NDC 02420030164

## 2012-05-24 MED ADMIN — vancomycin (VANCOCIN) 2000 mg in NS 500 ml infusion: INTRAVENOUS | @ 14:00:00 | NDC 68258901501

## 2012-05-24 MED ADMIN — HYDROmorphone (PF) (DILAUDID) injection 1 mg: INTRAVENOUS | @ 17:00:00 | NDC 00409255201

## 2012-05-24 MED ADMIN — calcium acetate (PHOSLO) tablet 1,334 mg: ORAL | @ 21:00:00 | NDC 00574011302

## 2012-05-24 MED ADMIN — levofloxacin (LEVAQUIN) 750 mg in D5W IVPB: INTRAVENOUS | @ 16:00:00 | NDC 25021013283

## 2012-05-24 MED ADMIN — vancomycin (VANCOCIN) 2000 mg in NS 500 ml infusion: INTRAVENOUS | @ 19:00:00 | NDC 09999945502

## 2012-05-24 MED ADMIN — HYDROmorphone (PF) (DILAUDID) injection 4 mg: INTRAVENOUS | @ 13:00:00 | NDC 59011044225

## 2012-05-24 MED ADMIN — sodium chloride (NS) flush 5-10 mL: INTRAVENOUS | @ 20:00:00 | NDC 87701099893

## 2012-05-24 MED ADMIN — HYDROmorphone (DILAUDID) 2 mg/mL injection: @ 12:00:00 | NDC 00641012121

## 2012-05-24 MED ADMIN — diphenhydrAMINE (BENADRYL) injection 25 mg: INTRAVENOUS | @ 12:00:00 | NDC 00641037621

## 2012-05-24 MED ADMIN — calcium acetate (PHOSLO) tablet 1,334 mg: ORAL | @ 17:00:00 | NDC 00574011302

## 2012-05-24 MED ADMIN — heparin (porcine) injection 5,000 Units: SUBCUTANEOUS | @ 17:00:00 | NDC 25021040201

## 2012-05-24 MED ADMIN — 0.9% sodium chloride infusion: INTRAVENOUS | @ 17:00:00 | NDC 00409798309

## 2012-05-24 MED ADMIN — hydroxyurea (HYDREA) chemo cap 500 mg: ORAL | @ 17:00:00 | NDC 49884072401

## 2012-05-24 MED ADMIN — ondansetron (ZOFRAN) 4 mg/2 mL injection: INTRAVENOUS | @ 12:00:00 | NDC 00409475503

## 2012-05-24 MED ADMIN — HYDROmorphone (DILAUDID) 2 mg/mL injection: @ 13:00:00 | NDC 00409131236

## 2012-05-24 MED ADMIN — piperacillin-tazobactam (ZOSYN) 2.25 g in 0.9% sodium chloride (MBP/ADV) 50 mL MBP: INTRAVENOUS | @ 17:00:00 | NDC 00338055311

## 2012-05-24 MED ADMIN — diphenhydrAMINE (BENADRYL) injection 12.5 mg: INTRAVENOUS | @ 21:00:00 | NDC 63323066401

## 2012-05-24 MED FILL — ZOSYN 2.25 GRAM INTRAVENOUS SOLUTION: 2.25 gram | INTRAVENOUS | Qty: 2.25

## 2012-05-24 MED FILL — HYDROMORPHONE 2 MG/ML INJECTION SOLUTION: 2 mg/mL | INTRAMUSCULAR | Qty: 2

## 2012-05-24 MED FILL — ONDANSETRON (PF) 4 MG/2 ML INJECTION: 4 mg/2 mL | INTRAMUSCULAR | Qty: 4

## 2012-05-24 MED FILL — NEPHROCAPS 1 MG CAPSULE: 1 mg | ORAL | Qty: 1

## 2012-05-24 MED FILL — LEVAQUIN 750 MG/150 ML IN 5 % DEXTROSE INTRAVENOUS PIGGYBACK: 750 mg/150 mL | INTRAVENOUS | Qty: 150

## 2012-05-24 MED FILL — HYDROMORPHONE 15 MG/30 ML (0.5 MG/ML) IN 0.9% SOD.CHLORIDE INFUSION: 15 mg/30 mL (0.5 mg/mL) | INTRAVENOUS | Qty: 30

## 2012-05-24 MED FILL — VANCOMYCIN 500 MG IV SOLR: 500 mg | INTRAVENOUS | Qty: 500

## 2012-05-24 MED FILL — CALCIUM ACETATE 667 MG TAB: 667 mg | ORAL | Qty: 2

## 2012-05-24 MED FILL — INSULIN GLARGINE 100 UNIT/ML INJECTION: 100 unit/mL | SUBCUTANEOUS | Qty: 0.08

## 2012-05-24 MED FILL — HYDROMORPHONE (PF) 1 MG/ML IJ SOLN: 1 mg/mL | INTRAMUSCULAR | Qty: 1

## 2012-05-24 MED FILL — HYDROMORPHONE 2 MG/ML INJECTION SOLUTION: 2 mg/mL | INTRAMUSCULAR | Qty: 1

## 2012-05-24 MED FILL — DIPHENHYDRAMINE HCL 50 MG/ML IJ SOLN: 50 mg/mL | INTRAMUSCULAR | Qty: 1

## 2012-05-24 MED FILL — VANCOMYCIN IN 0.9 % SODIUM CHLORIDE 2 GRAM/500 ML IV: 2 gram/500 mL | INTRAVENOUS | Qty: 500

## 2012-05-24 MED FILL — HYDROMORPHONE (PF) 1 MG/ML IJ SOLN: 1 mg/mL | INTRAMUSCULAR | Qty: 2

## 2012-05-24 MED FILL — PHARMACY INFORMATION NOTE: Qty: 1

## 2012-05-24 MED FILL — HEPARIN (PORCINE) 5,000 UNIT/ML IJ SOLN: 5000 unit/mL | INTRAMUSCULAR | Qty: 1

## 2012-05-24 MED FILL — SODIUM CHLORIDE 0.9 % IV: INTRAVENOUS | Qty: 1000

## 2012-05-24 MED FILL — PROCHLORPERAZINE EDISYLATE 5 MG/ML INJECTION: 5 mg/mL | INTRAMUSCULAR | Qty: 2

## 2012-05-24 NOTE — ED Notes (Signed)
Pt refusing to let RN hook up his IV antibiotics for his pneumonia until "i get more pain medication". Spoke with hospitalist regarding pain medication, not appropriate at this time. Pt received 4 mg of IV Dilaudid @ 0915. Md aware pt is refusing antibiotics and is going to come speak with pt regarding his pain management.

## 2012-05-24 NOTE — ED Notes (Signed)
Pt placed on [place] oxygen at 4L, states this helps with pain.

## 2012-05-24 NOTE — ED Notes (Signed)
IV attempted x 1 without success.

## 2012-05-24 NOTE — ED Provider Notes (Signed)
HPI Comments: 31 y.o.male with past medical hx significant for sickle cell anemia, acute chest syndrome, diabetes, ESRD presents to the ED with complaint of pain to bilateral lower back pain and lower extremities consistent with sickle cell crisis. Pt states that he is from [place] but he is here in [place] for a funeral. Pt is a Monday, Wednesday, Friday dialysis pt. He states that he was last dialyzed yesterday. Pt reports taking dilaudid (4mg ) every 2-4 hours. Pt denies fever, cough,chest pain, SOB, abdominal pain, and vomiting.     Note written by Romie Levee, Scribe, as dictated by Levada Schilling, MD 7:30 AM        The history is provided by the patient.        Past Medical History   Diagnosis Date   ??? Sickle cell anemia    ??? ESRD on hemodialysis      MWF   ??? Diabetes mellitus         No past surgical history on file.      No family history on file.     History     Social History   ??? Marital Status: SINGLE     Spouse Name: N/A     Number of Children: N/A   ??? Years of Education: N/A     Occupational History   ??? Not on file.     Social History Main Topics   ??? Smoking status: Not on file   ??? Smokeless tobacco: Not on file   ??? Alcohol Use:    ??? Drug Use:    ??? Sexually Active:      Other Topics Concern   ??? Not on file     Social History Narrative   ??? No narrative on file                  ALLERGIES: Review of patient's allergies indicates no known allergies.      Review of Systems   Constitutional: Negative for fever.   HENT: Negative for neck stiffness.    Eyes: Negative for visual disturbance.   Respiratory: Negative for cough, shortness of breath and wheezing.    Cardiovascular: Negative for chest pain and leg swelling.   Gastrointestinal: Negative for nausea, vomiting, abdominal pain and diarrhea.   Genitourinary: Negative for dysuria.   Musculoskeletal: Positive for back pain.   Skin: Negative for rash.   Neurological: Negative.  Negative for syncope and headaches.   Hematological: Negative.     Psychiatric/Behavioral: Negative for confusion.   All other systems reviewed and are negative.        Filed Vitals:    05/24/12 0647   BP: 181/93   Pulse: 101   Temp: 98 ??F (36.7 ??C)   Resp: 24   Height: 5\' 5"  (1.651 m)   Weight: 79.379 kg (175 lb)   SpO2: 98%            Physical Exam   Nursing note and vitals reviewed.  Constitutional: He is oriented to person, place, and time. He appears well-developed and well-nourished. No distress.        Appears to be uncomfortable   HENT:   Head: Normocephalic.   Eyes: Pupils are equal, round, and reactive to light.   Neck: Normal range of motion.   Cardiovascular: Normal rate and regular rhythm.    No murmur heard.  Pulmonary/Chest: Effort normal and breath sounds normal. No respiratory distress.   Abdominal: Soft. There is no tenderness.  Musculoskeletal: Normal range of motion. He exhibits no edema.        Leg pain bilaterally   Neurological: He is alert and oriented to person, place, and time.   Skin: Skin is warm and dry.   Psychiatric: He has a normal mood and affect. His behavior is normal.        MDM    Procedures    9:45  Patient now complaining of cough.  Will admit.    Xray with pneumonia.  Will treat for HCAP.    CONSULT:  Dr. Sherryll Burger -will admit    A/P:  1.  SS disease with crisis  2.  Pneumonia - no significant tachypnea or hypoxia to suggest acute chest.  Treating for HCAP  3.  ESRD - M,W, F dialysis

## 2012-05-24 NOTE — ED Notes (Signed)
After speaking with Dr. Sherryll Burger and Dr. Skipper Cliche, pt is now accepting his antibiotics. IV Levaquin hung at this time.

## 2012-05-24 NOTE — ED Notes (Signed)
Triage Note: pt reports middle and lower back pain and bilateral leg pain. Pt has sickle cell disease and believes this could be a flare. Last crisis was 5 months ago.

## 2012-05-24 NOTE — Progress Notes (Addendum)
1208 - received pt to PTU from ED; pt stated before being attached to tele "where's my damn pca pump? Get it now."  Assured pt that we would review orders and order a pca pump; pt stated "I'm not messing around with you, just get it now; let pt know I would get pca as soon as I got his vital signs and assessed his stability; ordered pca pump  1230 - pt refusing to answer questions from admission nurse until pca arrives.  1236 - pt refused to be transported to radiology.

## 2012-05-24 NOTE — Progress Notes (Signed)
2200: This charge RN entered patient room to find pt standing in front of the work-station-on-wheels (WOW), WOW moved to hallway- pt had already been told this equipment is not intended for patient use. RN attempted to address pt's repeated "ring outs", suggested patient ask for multiple things when he needed assistance. For example, have 2 or 3 apple juices and a cup of ice rather than repeatedly ringing out for one apple juice at a time. Pt stated that was not acceptable and he would ring out when he needed or wanted something. RN reminded the patient that we would do our best to meet his needs, with respect for all patients' needs. Pt asked that supervisor be paged, supervisor notified.

## 2012-05-24 NOTE — Consults (Signed)
atsp for consultation regarding SS management after admission for back pain  Pt. Examined, chart reviewed,  Consult dictated.  Harlen Labs, MD

## 2012-05-24 NOTE — Consults (Signed)
Name:       Tyler Ballard, Tyler Ballard          Admitted:          05/24/2012                                         DOB:               Jan 18, 1981  Account #:  [id number]               Age:               31  Consultant: Harlen Labs, MD         Location                                CONSULTATION REPORT    DATE OF CONSULTATION           05/24/2012      HISTORY OF PRESENT ILLNESS: The patient is a 31 year old gentleman with  sickle cell anemia who is seen after admission to [facility] for  back pain consistent with sickle cell pain crisis. This gentleman lives in  [place], [place] and is seen by a hematologist there who manages his  sickle cell anemia, but the patient came to [place] to attend a funeral  this weekend, and over the past couple of days has had increasing  difficulty keeping his pain medicines down. His pain crescendoed this  morning he came to the emergency room and was admitted for pain crisis. He  denies any recent fevers, chills. His pain is in the back and hips  bilaterally. This is a common location for his pain crises symptoms.    PAST MEDICAL HISTORY: Includes sickle cell anemia. He is on hydroxyurea,  has been in the hospital for pain crisis as recently as 1 month ago. His  last transfusion was 3 months ago. He has end-stage renal disease on  hemodialysis, diabetes, and hypertension.    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 He has had AV fistulas surgery for his dialysis.    SOCIAL HISTORY: Single. He is a Paediatric nurse. He is a 1 pack per day smoker. No  history of alcohol abuse.    FAMILY HISTORY: His mother died of cervical cancer. Father has high blood  pressure, both parents had sickle cell trait.    ALLERGIES: HAS NO KNOWN DRUG ALLERGIES.    REVIEW OF SYSTEMS: As noted above, otherwise noncontributory.    PHYSICAL EXAMINATION  GENERAL: He is a pleasant, well-developed, well-nourished male in no  apparent distress.  VITAL SIGNS:  Temperature 95.6, pulse 98, respirations 20, BP 167/89, O2  saturation 98% on room air.  HEENT: EOMI, nonicteric sclerae.  NECK: Supple. He has no thyromegaly.  LUNGS: Reveal diminished breath sounds in both lung bases, otherwise  clear.  CARDIAC: Tachycardic, 1/6 systolic murmur. No rub.  ABDOMEN: Soft, nontender. No organomegaly.  EXTREMITIES: He has chronic venous stasis changes in both ankles.  NEUROLOGIC: AO x3, nonfocal.    DIAGNOSTIC DATA: Chest x-ray shows a left lower lobe pneumonia.    LABORATORIES: CBC reveals a white count of 2.9, hemoglobin 7.9, platelet  count 183. Chemistries reveal creatinine of 6. Blood cultures are pending.    IMPRESSION: Sickle cell  pain crisis. Management with the PCA narcotics  hydration and oxygen are the standard of care for this problem and it  sounds like the patient's pain is quite consistent with past sickle cell  crises. His pneumonia may have been a precipitating cause for the current  pain crisis, and he has been cultured and will continue on the  broad-spectrum antibiotics already ordered. This gentleman is on  hemodialysis for end-stage renal disease and will be seen by Nephrology  this admission. Depending on the duration of the admission, he may need to  have dialysis performed here. Finally, this gentleman's hemoglobin is 7.9  which is close to his baseline. He is not symptomatic at the current time.  He can hold off on transfusion this evening and reevaluate in the morning.  We will follow closely with you.                Harlen Labs, MD    cc:                       Harlen Labs, MD      JPE/wmx; D: 05/24/2012 05:29 P; T: 05/24/2012 05:51 P; DOC# [id number]; Job#  [id number]

## 2012-05-24 NOTE — Progress Notes (Signed)
NO BP OR IV STICKS RIGHT ARM!

## 2012-05-24 NOTE — Consults (Signed)
Palliative Medicine Initial Consult  [phone]) [phone])    Patient Name: Tyler Ballard  Date of Birth: Sep 12, 1981    Date of Initial Consult: 05/24/12   Reason for Consult: overwhelming symptoms  Requesting Physician: Dr. Sherryll Burger  Primary Care Physician: Sol Blazing, MD    [facility] [place]  Nephrology: Harless Nakayama  Hematology: Isaiah Serge  PCP: Alycia Patten     Summary:   Mr. Tyler Ballard is a 31year old with past history of DM, HTN, Sickle Cell and ESRD on dialysis. Admitted on 6/613 from a friend's house/visiting [place] for a funeral with a diagnosis of acute sickle crisis and pneumonia. Current issues include sickle pain, pneumonia on antibiotics, anemia, elevated creatinine. Palliative Medicine consulted for overwhelming symptoms (pain).     Palliative Diagnoses:   1. Back pain/pain in limb (sickle cell pain)  2. Pruritis  3. Fatigue  4. Shortness of breath (better)  5. Tobacco dependence     Plan:   1. Pain: Start dilaudid PCA 1mg  every without nurse bolus dose at this time (due to demands he had in ED). Would like to emphasize PCA use only rather than nurse bolus doses.  2. Pruritis: Benadryl 12.5mg  IV prn. Avoid frequent or high dose due to concern of sedation  3. Get records- getting contact information for all doctors he reports in care.   4. Nicotine patch for smoking   5. AMD to complete: consult Pastoral Care     Goals of Care:   1. Safe and effective pain control    Resuscitation Status: Full Code   Health Care Proxy: Barnie Alderman sister [phone]  Advanced Directive: Would like to complete    Thank you for including Palliative Medicine in this patient's care.  Madelin Headings, MD         History obtained from: chart, patient    Chief Complaint: I have this PCA    History of Present Illness:  Patient had nausea vomiting last night, pain started. In town visiting friend for a funeral. Had family bring him to ED. Pain is aching deep in back and throughout legs, typical of his  sickle cell pain. No weakness urinary or bowel issues. No fevers. He had sob but this is better, has some nausea, is itching and asked for benadryl, denies depression, anxiety. Has been tested for Hepatitis and HIV and reports being negative. Doesn't due drugs or drink alcohol, does smoke, would like nicotine patch. Doesn't like to be confined to room.  He has had 2 admissions to [facility] in the past year. He is dialyzed M [facility] F since 2009 at [facility] (he wasn't sure how to spell it).     CXR 05/24/12: IMPRESSION: Left lower lobe pneumonia. Distal clavicle osteolysis can be secondary to chronic repetitive trauma. but hyperparathyroidism should be excluded.    FUNCTIONAL ASSESSMENT    Palliative Performance Scale (PPS):  PPS: 80    Review of Systems:  The following systems were reviewed: constitutional, ears/nose/mouth/throat, respiratory, gastrointestinal, musculoskeletal, neurologic, psychiatric, endocrine. Positive findings noted below.  Modified ESAS Completed by: provider   Fatigue: 4 Drowsiness: 2   Depression: 0 Pain: 6   Anxiety: 0 Nausea: 2     Dyspnea: 2   Secretions: No Constipation: No   Unable to Respond: No Delirium: No     Principal Problem:   *Sickle cell crisis (05/24/2012)    Past Medical History   Diagnosis Date   ??? Sickle cell  anemia    ??? ESRD on hemodialysis      MWF   ??? Diabetes mellitus    ??? Other ill-defined conditions      sickle cell   ??? Hypertension       Past Surgical History   Procedure Date   ??? Hx orthopaedic      rt hip    ??? Hx vascular access      av  fistula right arm.      History reviewed. No pertinent family history.   History   Substance Use Topics   ??? Smoking status: Current Everyday Smoker -- 0.2 packs/day for 10 years   ??? Smokeless tobacco: Never Used   ??? Alcohol Use: No     Current Facility-Administered Medications   Medication Dose Route Frequency   ??? HYDROmorphone (PF) (DILAUDID) injection 2 mg  2 mg IntraVENous NOW   ??? ondansetron (ZOFRAN) injection 8 mg  8 mg  IntraVENous NOW   ??? HYDROmorphone (DILAUDID) 2 mg/mL injection       ??? diphenhydrAMINE (BENADRYL) injection 25 mg  25 mg IntraVENous NOW   ??? HYDROmorphone (PF) (DILAUDID) injection 4 mg  4 mg IntraVENous NOW   ??? levofloxacin (LEVAQUIN) 500 mg in D5W IVPB  500 mg IntraVENous Q48H   ??? piperacillin-tazobactam (ZOSYN) 2.25 g in 0.9% sodium chloride (MBP/ADV) 50 mL MBP  2.25 g IntraVENous Q8H   ??? amLODIPine (NORVASC) tablet 5 mg  5 mg Oral DAILY   ??? calcium acetate (PHOSLO) tablet 1,334 mg  2 Tab Oral TID WITH MEALS   ??? hydroxyurea (HYDREA) chemo cap 500 mg  500 mg Oral DAILY   ??? insulin glargine (LANTUS) injection 8 Units  8 Units SubCUTAneous QHS   ??? lisinopril (PRINIVIL, ZESTRIL) tablet 10 mg  10 mg Oral DAILY   ??? sodium chloride (NS) flush 5-10 mL  5-10 mL IntraVENous Q8H   ??? sodium chloride (NS) flush 5-10 mL  5-10 mL IntraVENous PRN   ??? HYDROmorphone (PF) (DILAUDID) injection 1 mg  1 mg IntraVENous Q3H PRN   ??? heparin (porcine) injection 5,000 Units  5,000 Units SubCUTAneous Q8H   ??? HYDROmorphone (PF) 15 mg/30 ml (DILAUDID) PCA   IntraVENous CONTINUOUS   ??? 0.9% sodium chloride infusion  75 mL/hr IntraVENous CONTINUOUS   ??? B complex-vitaminC-folic acid (NEPHROCAP) cap  1 Cap Oral DAILY   ??? vancomycin (VANCOCIN) 2000 mg in NS 500 ml infusion  2,000 mg IntraVENous NOW   ??? Vancomycin pharmacy dosing  1 Each Other PRN   ??? vancomycin (VANCOCIN) 500 mg in 0.9% sodium chloride (MBP/ADV) 100 mL MBP  500 mg IntraVENous DIALYSIS PRN   ??? DISCONTD: HYDROmorphone (PF) (DILAUDID) injection 2 mg  2 mg IntraVENous NOW   ??? DISCONTD: prochlorperazine (COMPAZINE) injection 10 mg  10 mg IntraVENous NOW   ??? DISCONTD: HYDROmorphone (DILAUDID) 2 mg/mL injection       ??? DISCONTD: levofloxacin (LEVAQUIN) 750 mg in D5W IVPB  750 mg IntraVENous NOW   ??? DISCONTD: piperacillin-tazobactam (ZOSYN) 2.25 g in 0.9% sodium chloride (MBP/ADV) 50 mL MBP  2.25 g IntraVENous Q8H   ??? DISCONTD: vancomycin (VANCOCIN) 2000 mg in NS 500 ml infusion  2,000 mg  IntraVENous NOW   ??? DISCONTD: Nephrovite (Rx Compound Substitution)  1 Dose Oral DAILY   ??? DISCONTD: hydroxyurea (HYDREA) chemo cap 500 mg  500 mg Oral Q MON, WED & FRI     No Known Allergies  Physical Exam:    Wt Readings from Last 3 Encounters:   05/24/12 185 lb 6.5 oz (84.1 kg)     Blood pressure 154/85, pulse 92, temperature 98.1 ??F (36.7 ??C), resp. rate 18, height 5\' 5"  (1.651 m), weight 185 lb 6.5 oz (84.1 kg), SpO2 99.00%.  Pain:  Pain Scale 1: Numeric (0 - 10)  Pain Intensity 1: 8  Pain Onset 1:  (yesterday)  Pain Location 1: Back;Hip (back radiates to both hips)  Pain Orientation 1: Lower  Pain Description 1: Constant;Aching  Pain Intervention(s) 1: Medication (see MAR)    Constitutional: standing up in room, NAD  Eyes: pupils equal, anicteric  ENMT: no nasal discharge, moist mucous membranes  Cardiovascular:   Respiratory: breathing not labored, symmetric  Gastrointestinal: soft non-tender, +bowel sounds  Musculoskeletal: no deformity, no tenderness to palpation  Skin: warm, dry  Neurologic: follows commands, moving all extremities  Psychiatric: full affect, no hallucinations  Other: itching, xerosis    Lab Data Reviewed:  Lab Results   Component Value Date/Time    WBC 2.9 05/24/2012  7:15 AM    HGB 7.9 05/24/2012  7:15 AM    PLATELET 183 05/24/2012  7:15 AM     Lab Results   Component Value Date/Time    Sodium 134 05/24/2012  7:15 AM    Potassium 4.1 05/24/2012  7:15 AM    Chloride 94 05/24/2012  7:15 AM    CO2 28 05/24/2012  7:15 AM    BUN 33 05/24/2012  7:15 AM    Creatinine 6.00 05/24/2012  7:15 AM    Calcium 9.1 05/24/2012  7:15 AM      Lab Results   Component Value Date/Time    AST 27 05/24/2012  7:15 AM    ALT 33 05/24/2012  7:15 AM    Alk. phosphatase 106 05/24/2012  7:15 AM    Bilirubin, total 0.5 05/24/2012  7:15 AM    Protein, total 9.9 05/24/2012  7:15 AM    Albumin 3.3 05/24/2012  7:15 AM    Globulin 6.6 05/24/2012  7:15 AM           Total time:   Counseling / coordination time:   > 50% counseling / coordination?:

## 2012-05-24 NOTE — Progress Notes (Signed)
Pt. Non compliant w/ renal diet ,MD aware ,cardiac diet ordered

## 2012-05-24 NOTE — Other (Signed)
TRANSFER - OUT REPORT:    Verbal report given to RN (name) on [employer]  being transferred to PTU (unit) for routine progression of care       Report consisted of patient???s Situation, Background, Assessment and   Recommendations(SBAR).     Information from the following report(s) SBAR and ED Summary was reviewed with the receiving nurse.    Opportunity for questions and clarification was provided.

## 2012-05-24 NOTE — H&P (Signed)
Tyler Kidney, MD  After 7pm please call operator for physician on call    Hospitalist Admit Note     NAME: Tyler Ballard   DOB:  June 15, 1981   MRN:  [id number]     Date/Time:  05/24/2012 11:09 AM    Patient PCP: Phys Other, MD    CHIEF COMPLAINT:  Back and leg pain, like sickle cell crises    HISTORY OF PRESENT ILLNESS:     This is a very pleasant 31 year old male who comes in for what he states is a sickle cell crises.  The patient is from [place] and is here for a funeral.  He started to develop back pain, right hip pain and leg pain consistent with what he says are his typical crises.  He normally takes 4mg  po dilaudid every 3 hours.  He denies chest pain, but complains of cough.  No fevers chills.  No abdominal pain, nausea or vomiting.  He has ESRD M/W/F and has a fistula in his right arm.  CXR in ER shows a pneumonia.  He has received 8mg  IV dilaudid in ER and wants more pain medicine.  Says he has avascular necrosis.  Has had acute chest in past, but this doesn't feel like that.    Past Medical History   Diagnosis Date   ??? Sickle cell anemia    ??? ESRD on hemodialysis      MWF   ??? Diabetes mellitus       No past surgical history on file.  History   Substance Use Topics   ??? Smoking status: Yes, 1ppd   ??? Smokeless tobacco: Not on file   ??? Alcohol Use: none      Mom- died of cervical cancer, father has htn.  Both parents have sickle cell trait  No Known Allergies     Prior to Admission medications    Medication Sig Start Date End Date Taking? Authorizing Provider   HYDROmorphone (DILAUDID) 4 mg tablet Take  by mouth every three (3) hours as needed.   Yes Phys Other, MD   insulin aspart (NOVOLOG) 100 unit/mL injection by SubCUTAneous route. Sliding scale   Yes Phys Other, MD   insulin glargine (LANTUS) 100 unit/mL injection 8 Units by SubCUTAneous route nightly.   Yes Phys Other, MD   amLODIPine (NORVASC) 5 mg tablet Take 5 mg by mouth daily.   Yes Phys  Other, MD   lisinopril (PRINIVIL, ZESTRIL) 10 mg tablet Take 10 mg by mouth daily.   Yes Phys Other, MD   hydroxyurea (HYDREA) 500 mg capsule Take 500 mg by mouth daily.   Yes Phys Other, MD   calcium acetate (PHOSLO) 667 mg cap Take 2 Caps by mouth three (3) times daily (with meals).   Yes Phys Other, MD   folic acid 1 mg Tab 0.5 mg, multivitamin, stress formula Tab 1 Tab Take 1 Dose by mouth daily.   Yes Phys Other, MD       Code Status:  Full Code X   DNR/DNI        REVIEW OF SYSTEMS:    See HPI for pertinent positives and negatives.  Complete 10 system review was otherwise negative.    Objective:     PHYSICAL EXAM:  VITALS:    Visit Vitals   Item Reading   ??? BP 163/81   ??? Pulse 89   ??? Temp 98 ??F (36.7 ??C)   ??? Resp 24   ??? Ht 5'  5" (1.651 m)   ??? Wt 175 lb   ??? BMI 29.12 kg/m2   ??? SpO2 97%     General:  Well nourished, calm, appropriate, NAD  HEENT: PERRLA bilaterally, neck supple, no JVD, no thyromegaly, no lymphadenopathy  CVS: RRR, no murmurs, rubs or gallops  Lungs: diminished left base otherwise clear to auscultation, no wheezing, rales or rhonchi  GI: soft nontender, NABS.  No rebound or guarding  EXT: 2+ pulses bilaterally, trace edema, fistula right arm  Neuro: Cranial nerves intact, strength 5/5 in all 4 extremities, no gross focal neuro deficits, AAO X3  Skin: intact  Psych: normal affect    LAB DATA REVIEWED:    Recent Results (from the past 24 hour(s))   CBC WITH AUTOMATED DIFF    Collection Time    05/24/12  7:15 AM       Component Value Range    WBC 2.9 (*) 4.1 - 11.1 K/uL    RBC 2.53 (*) 4.10 - 5.70 M/uL    HGB 7.9 (*) 12.1 - 17.0 g/dL    HCT 65.7 (*) 84.6 - 50.3 %    MCV 97.2  80.0 - 99.0 FL    MCH 31.2  26.0 - 34.0 PG    MCHC 32.1  30.0 - 36.5 g/dL    RDW 96.2 (*) 95.2 - 14.5 %    PLATELET 183  150 - 400 K/uL    NEUTROPHILS 72  32 - 75 %    LYMPHOCYTES 12  12 - 49 %    MONOCYTES 15 (*) 5 - 13 %    EOSINOPHILS 1  0 - 7 %    BASOPHILS 0  0 - 1 %    ABS. NEUTROPHILS 2.2  1.8 - 8.0 K/UL    ABS.  LYMPHOCYTES 0.3 (*) 0.8 - 3.5 K/UL    ABS. MONOCYTES 0.4  0.0 - 1.0 K/UL    ABS. EOSINOPHILS 0.0  0.0 - 0.4 K/UL    ABS. BASOPHILS 0.0  0.0 - 0.1 K/UL    DF SMEAR SCANNED      RBC COMMENTS        Value: 1+ ANISOCYTOSIS      OVALOCYTES PRESENT   METABOLIC PANEL, COMPREHENSIVE    Collection Time    05/24/12  7:15 AM       Component Value Range    Sodium 134 (*) 136 - 145 MMOL/L    Potassium 4.1  3.5 - 5.1 MMOL/L    Chloride 94 (*) 97 - 108 MMOL/L    CO2 28  21 - 32 MMOL/L    Anion gap 12  5 - 15 mmol/L    Glucose 116 (*) 65 - 100 MG/DL    BUN 33 (*) 6 - 20 MG/DL    Creatinine 8.41 (*) 0.45 - 1.15 MG/DL    BUN/Creatinine ratio 6 (*) 12 - 20      GFR est AA 13 (*) >60 ml/min/1.69m2    GFR est non-AA 11 (*) >60 ml/min/1.96m2    Calcium 9.1  8.5 - 10.1 MG/DL    Bilirubin, total 0.5  0.2 - 1.0 MG/DL    ALT 33  12 - 78 U/L    AST 27  15 - 37 U/L    Alk. phosphatase 106  50 - 136 U/L    Protein, total 9.9 (*) 6.4 - 8.2 g/dL    Albumin 3.3 (*) 3.5 - 5.0 g/dL    Globulin 6.6 (*) 2.0 - 4.0  g/dL    A-G Ratio 0.5 (*) 1.1 - 2.2     RETICULOCYTE COUNT    Collection Time    05/24/12  7:15 AM       Component Value Range    Reticulocyte count 1.3  0.7 - 2.1 %       CXR: left lower lobe pneumonia    EKG: I have personally reviewed this ekg report and findings are:  pending    Assessment/Plan:  1. Sickle cell crises- check films right hip, pain management- discussed with palliative care- start PCA, heme eval, gentle hydration, 02 as needed  2. ESRD- nephrology consulted  3. HTN- resume home meds  4. DM- resume lantus, ssi  5. DVT prophylaxis- heparin  ________________________________________________________________________  Gwendlyn Deutscher. Sherryll Burger, MD    Time spent in this patient's care was 50 minutes

## 2012-05-24 NOTE — Progress Notes (Signed)
Day # 1 of  Vancomycin  Indication:   Admitted for sickle cell crisis  CXR: left lower lobe pneumonia  Abx regimen: zosyn 2.25 gm q 8hr                         levaquin 750 mg day 1, then 500 mg q48 hr  Recent Labs  Basename 05/24/12 0715   WBC 2.9*   CREA 6.00*   BUN 33*    Estimated CrCl:   ESRD on HD  Tmax last 24 hours:  afebrile  Cultures:6/6 blood cx pending    Vancomycin 2 grams today, then 500 mg after each HD

## 2012-05-24 NOTE — Progress Notes (Signed)
Chart reviewed for PICC placement and the patient is on dialysis patient. The patient may benefit from an alternative access device.

## 2012-05-24 NOTE — Progress Notes (Signed)
Admission Medication Reconciliation:    Information obtained from: patient    Significant PMH/Disease States:   Past Medical History   Diagnosis Date   ??? Sickle cell anemia    ??? ESRD on hemodialysis      MWF   ??? Diabetes mellitus        Chief Complaint for this Admission:  Sickle cell crisis    Allergies:  Review of patient's allergies indicates no known allergies.    Prior to Admission Medications:   Prior to Admission Medications   Medication Last Dose Informant Patient Reported? Taking?   HYDROmorphone (DILAUDID) 4 mg tablet 05/23/2012  Yes Yes   Take  by mouth every three (3) hours as needed.   insulin aspart (NOVOLOG) 100 unit/mL injection 05/23/2012  Yes Yes   by SubCUTAneous route. Sliding scale   insulin glargine (LANTUS) 100 unit/mL injection 05/23/2012  Yes Yes   8 Units by SubCUTAneous route nightly.   amLODIPine (NORVASC) 5 mg tablet 05/23/2012  Yes Yes   Take 5 mg by mouth daily.   lisinopril (PRINIVIL, ZESTRIL) 10 mg tablet 05/23/2012  Yes Yes   Take 10 mg by mouth daily.   hydroxyurea (HYDREA) 500 mg capsule 05/23/2012  Yes Yes   Take 500 mg by mouth daily.   calcium acetate (PHOSLO) 667 mg cap 05/23/2012  Yes Yes   Take 2 Caps by mouth three (3) times daily (with meals).   folic acid 1 mg Tab 0.5 mg, multivitamin, stress formula Tab 1 Tab 05/23/2012  Yes Yes   Take 1 Dose by mouth daily.

## 2012-05-24 NOTE — Progress Notes (Signed)
TRANSFER - IN REPORT:    Verbal report received from Crista RN(name) on [employer]  being received from ED(unit) for routine progression of care      Report consisted of patient???s Situation, Background, Assessment and   Recommendations(SBAR).     Information from the following report(s) SBAR was reviewed with the receiving nurse.    Opportunity for questions and clarification was provided.      Assessment completed upon patient???s arrival to unit and care assumed.

## 2012-05-24 NOTE — Progress Notes (Addendum)
Problem: Falls - Risk of  Goal: *Absence of falls  Outcome: Progressing Towards Goal  No current falls, patient up ad lib  Goal: *Knowledge of fall prevention  Outcome: Progressing Towards Goal  Bed in low position, call bell within reach, hourly rounds    Problem: Pain  Goal: *Control of Pain  Outcome: Progressing Towards Goal  Patient has PCA    Patient has been continuing to ring out for PCT. Spoke with patient and informed that PCT has the whole unit to take care of and will be in as soon as possible. Patient agreed.  2200: Patient continuing to ring out in spike of told to be patient.  0140: Patient calls out immediatly after nurse/ tech leaves room. Calling for apple juice asked patient if 2 at a time was okay patient stated "No I only want 1 at a time."  Patient increasingly rude to staff and at time verbally abusive.

## 2012-05-24 NOTE — Consults (Signed)
Followup for ESRD.  Usual state of health (DM; SSD; ESRD since 2009) who developed SS crisis while here for a funeral. Dialyzed yesterday without problem. This crisis is similar to his "usual (q 4 mos or so) but also has some N&V that is a bit unusual.  He usually takes hydroxyurea and then dilaudid for crisis.      PMH otherwise is not very extensive.  SH smoker ?!  FH SST x 2  ROS pretty much negative otherwise. The pain and N&V are predominant. No problems with dialysis usually.    Alert and conversant. Seems comfortable now. No jvd. Chest clear.  Regular heart with a systolic murmur. Abdomen is without 'megaly or tenderness. There is some LE peripheral edema (which he says preceded ESRD) . Access is OK in RUA.     Dialysis support.    LABS:   Recent Results (from the past 24 hour(s))   CBC WITH AUTOMATED DIFF    Collection Time    05/24/12  7:15 AM       Component Value Range    WBC 2.9 (*) 4.1 - 11.1 K/uL    RBC 2.53 (*) 4.10 - 5.70 M/uL    HGB 7.9 (*) 12.1 - 17.0 g/dL    HCT 16.1 (*) 09.6 - 50.3 %    MCV 97.2  80.0 - 99.0 FL    MCH 31.2  26.0 - 34.0 PG    MCHC 32.1  30.0 - 36.5 g/dL    RDW 04.5 (*) 40.9 - 14.5 %    PLATELET 183  150 - 400 K/uL    NEUTROPHILS 72  32 - 75 %    LYMPHOCYTES 12  12 - 49 %    MONOCYTES 15 (*) 5 - 13 %    EOSINOPHILS 1  0 - 7 %    BASOPHILS 0  0 - 1 %    ABS. NEUTROPHILS 2.2  1.8 - 8.0 K/UL    ABS. LYMPHOCYTES 0.3 (*) 0.8 - 3.5 K/UL    ABS. MONOCYTES 0.4  0.0 - 1.0 K/UL    ABS. EOSINOPHILS 0.0  0.0 - 0.4 K/UL    ABS. BASOPHILS 0.0  0.0 - 0.1 K/UL    DF SMEAR SCANNED      RBC COMMENTS        Value: 1+ ANISOCYTOSIS      OVALOCYTES PRESENT   METABOLIC PANEL, COMPREHENSIVE    Collection Time    05/24/12  7:15 AM       Component Value Range    Sodium 134 (*) 136 - 145 MMOL/L    Potassium 4.1  3.5 - 5.1 MMOL/L    Chloride 94 (*) 97 - 108 MMOL/L    CO2 28  21 - 32 MMOL/L    Anion gap 12  5 - 15 mmol/L    Glucose 116 (*) 65 - 100 MG/DL    BUN 33 (*) 6 - 20 MG/DL    Creatinine 8.11 (*) 0.45 -  1.15 MG/DL    BUN/Creatinine ratio 6 (*) 12 - 20      GFR est AA 13 (*) >60 ml/min/1.97m2    GFR est non-AA 11 (*) >60 ml/min/1.101m2    Calcium 9.1  8.5 - 10.1 MG/DL    Bilirubin, total 0.5  0.2 - 1.0 MG/DL    ALT 33  12 - 78 U/L    AST 27  15 - 37 U/L    Alk. phosphatase 106  50 - 136  U/L    Protein, total 9.9 (*) 6.4 - 8.2 g/dL    Albumin 3.3 (*) 3.5 - 5.0 g/dL    Globulin 6.6 (*) 2.0 - 4.0 g/dL    A-G Ratio 0.5 (*) 1.1 - 2.2     RETICULOCYTE COUNT    Collection Time    05/24/12  7:15 AM       Component Value Range    Reticulocyte count 1.3  0.7 - 2.1 %       Patient Vitals for the past 12 hrs:   Temp Pulse Resp BP SpO2   05/24/12 1115 - - - 153/92 mmHg 97 %   05/24/12 1100 - - - 163/81 mmHg 97 %   05/24/12 0900 - 89  - - 100 %   05/24/12 0647 98 ??F (36.7 ??C) 101  24  181/93 mmHg 98 %

## 2012-05-24 NOTE — Telephone Encounter (Signed)
Error

## 2012-05-25 LAB — GLUCOSE, POC
Glucose (POC): 111 mg/dL — ABNORMAL HIGH (ref 75–110)
Glucose (POC): 109 mg/dL (ref 75–110)
Glucose (POC): 120 mg/dL — ABNORMAL HIGH (ref 75–110)
Glucose (POC): 121 mg/dL — ABNORMAL HIGH (ref 75–110)

## 2012-05-25 LAB — RENAL FUNCTION PANEL
Albumin: 3.2 g/dL — ABNORMAL LOW (ref 3.5–5.0)
Anion gap: 11 mmol/L (ref 5–15)
BUN/Creatinine ratio: 5 — ABNORMAL LOW (ref 12–20)
BUN: 42 MG/DL — ABNORMAL HIGH (ref 6–20)
CO2: 30 MMOL/L (ref 21–32)
Calcium: 7.8 MG/DL — ABNORMAL LOW (ref 8.5–10.1)
Chloride: 93 MMOL/L — ABNORMAL LOW (ref 97–108)
Creatinine: 8.06 MG/DL — ABNORMAL HIGH (ref 0.45–1.15)
GFR est AA: 10 mL/min/{1.73_m2} — ABNORMAL LOW (ref >60)
GFR est non-AA: 8 mL/min/{1.73_m2} — ABNORMAL LOW (ref >60)
Glucose: 121 MG/DL — ABNORMAL HIGH (ref 65–100)
Phosphorus: 5.9 MG/DL — ABNORMAL HIGH (ref 2.5–4.9)
Potassium: 4.6 MMOL/L (ref 3.5–5.1)
Sodium: 134 MMOL/L — ABNORMAL LOW (ref 136–145)

## 2012-05-25 LAB — CBC WITH AUTOMATED DIFF
ABS. BASOPHILS: 0 10*3/uL (ref 0.0–0.1)
ABS. EOSINOPHILS: 0.1 10*3/uL (ref 0.0–0.4)
ABS. LYMPHOCYTES: 0.8 10*3/uL (ref 0.8–3.5)
ABS. MONOCYTES: 0.3 10*3/uL (ref 0.0–1.0)
ABS. NEUTROPHILS: 1.4 10*3/uL — ABNORMAL LOW (ref 1.8–8.0)
BASOPHILS: 0 % (ref 0–1)
EOSINOPHILS: 5 % (ref 0–7)
HCT: 23.5 % — ABNORMAL LOW (ref 36.6–50.3)
HGB: 7.5 g/dL — ABNORMAL LOW (ref 12.1–17.0)
LYMPHOCYTES: 31 % (ref 12–49)
MCH: 31.3 PG (ref 26.0–34.0)
MCHC: 31.9 g/dL (ref 30.0–36.5)
MCV: 97.9 FL (ref 80.0–99.0)
MONOCYTES: 12 % (ref 5–13)
NEUTROPHILS: 52 % (ref 32–75)
PLATELET: 155 10*3/uL (ref 150–400)
RBC: 2.4 M/uL — ABNORMAL LOW (ref 4.10–5.70)
RDW: 14.6 % — ABNORMAL HIGH (ref 11.5–14.5)
WBC: 2.7 10*3/uL — ABNORMAL LOW (ref 4.1–11.1)

## 2012-05-25 LAB — MAGNESIUM: Magnesium: 2.2 MG/DL (ref 1.6–2.4)

## 2012-05-25 MED ADMIN — HYDROmorphone (PF) 15 mg/30 ml (DILAUDID) PCA: INTRAVENOUS | NDC 02420030164

## 2012-05-25 MED ADMIN — heparin (porcine) injection 5,000 Units: SUBCUTANEOUS | @ 10:00:00 | NDC 25021040201

## 2012-05-25 MED ADMIN — sodium chloride (NS) flush 5-10 mL: INTRAVENOUS | @ 18:00:00 | NDC 82903065462

## 2012-05-25 MED ADMIN — HYDROmorphone (PF) 15 mg/30 ml (DILAUDID) PCA: INTRAVENOUS | @ 10:00:00 | NDC 02420030164

## 2012-05-25 MED ADMIN — heparin (porcine) injection 5,000 Units: SUBCUTANEOUS | @ 19:00:00 | NDC 25021040201

## 2012-05-25 MED ADMIN — piperacillin-tazobactam (ZOSYN) 2.25 g in 0.9% sodium chloride (MBP/ADV) 50 mL MBP: INTRAVENOUS | @ 19:00:00 | NDC 00338055311

## 2012-05-25 MED ADMIN — calcium acetate (PHOSLO) tablet 1,334 mg: ORAL | @ 19:00:00 | NDC 00574011302

## 2012-05-25 MED ADMIN — heparin (porcine) injection 5,000 Units: SUBCUTANEOUS | @ 01:00:00 | NDC 25021040201

## 2012-05-25 MED ADMIN — diphenhydrAMINE (BENADRYL) injection 12.5 mg: INTRAVENOUS | @ 16:00:00 | NDC 00641037621

## 2012-05-25 MED ADMIN — piperacillin-tazobactam (ZOSYN) 2.25 g in 0.9% sodium chloride (MBP/ADV) 50 mL MBP: INTRAVENOUS | @ 01:00:00 | NDC 00338055311

## 2012-05-25 MED ADMIN — insulin glargine (LANTUS) injection 8 Units: SUBCUTANEOUS | @ 19:00:00 | NDC 09999222001

## 2012-05-25 MED ADMIN — HYDROmorphone (PF) 15 mg/30 ml (DILAUDID) PCA: INTRAVENOUS | @ 05:00:00 | NDC 02420030164

## 2012-05-25 MED ADMIN — amLODIPine (NORVASC) tablet 5 mg: ORAL | @ 19:00:00 | NDC 59762153006

## 2012-05-25 MED ADMIN — vancomycin (VANCOCIN) 500 mg in 0.9% sodium chloride (MBP/ADV) 100 mL MBP: INTRAVENOUS | @ 19:00:00 | NDC 23360015140

## 2012-05-25 MED ADMIN — lisinopril (PRINIVIL, ZESTRIL) tablet 10 mg: ORAL | @ 19:00:00 | NDC 68084006111

## 2012-05-25 MED ADMIN — calcium acetate (PHOSLO) tablet 1,334 mg: ORAL | @ 23:00:00 | NDC 00574011302

## 2012-05-25 MED ADMIN — HYDROmorphone (PF) 15 mg/30 ml (DILAUDID) PCA: INTRAVENOUS | @ 16:00:00 | NDC 02420030164

## 2012-05-25 MED ADMIN — diphenhydrAMINE (BENADRYL) injection 12.5 mg: INTRAVENOUS | @ 10:00:00 | NDC 00641037621

## 2012-05-25 MED ADMIN — diphenhydrAMINE (BENADRYL) injection 12.5 mg: INTRAVENOUS | @ 02:00:00 | NDC 00641037621

## 2012-05-25 MED ADMIN — piperacillin-tazobactam (ZOSYN) 2.25 g in 0.9% sodium chloride (MBP/ADV) 50 mL MBP: INTRAVENOUS | @ 10:00:00 | NDC 00338055311

## 2012-05-25 MED ADMIN — sodium chloride (NS) flush 5-10 mL: INTRAVENOUS | @ 10:00:00 | NDC 87701099893

## 2012-05-25 MED ADMIN — sodium chloride (NS) flush 5-10 mL: INTRAVENOUS | @ 01:00:00 | NDC 87701099893

## 2012-05-25 MED ADMIN — 0.9% sodium chloride infusion: INTRAVENOUS | @ 09:00:00 | NDC 00409798309

## 2012-05-25 MED ADMIN — hydroxyurea (HYDREA) chemo cap 500 mg: ORAL | @ 23:00:00 | NDC 68084028411

## 2012-05-25 MED FILL — HEPARIN (PORCINE) 5,000 UNIT/ML IJ SOLN: 5000 unit/mL | INTRAMUSCULAR | Qty: 1

## 2012-05-25 MED FILL — DIPHENHYDRAMINE HCL 50 MG/ML IJ SOLN: 50 mg/mL | INTRAMUSCULAR | Qty: 1

## 2012-05-25 MED FILL — CALCIUM ACETATE 667 MG TAB: 667 mg | ORAL | Qty: 2

## 2012-05-25 MED FILL — HYDROMORPHONE 15 MG/30 ML (0.5 MG/ML) IN 0.9% SOD.CHLORIDE INFUSION: 15 mg/30 mL (0.5 mg/mL) | INTRAVENOUS | Qty: 30

## 2012-05-25 MED FILL — AMLODIPINE 5 MG TAB: 5 mg | ORAL | Qty: 1

## 2012-05-25 MED FILL — ZOSYN 2.25 GRAM INTRAVENOUS SOLUTION: 2.25 gram | INTRAVENOUS | Qty: 2.25

## 2012-05-25 MED FILL — BD POSIFLUSH NORMAL SALINE 0.9 % INJECTION SYRINGE: INTRAMUSCULAR | Qty: 10

## 2012-05-25 MED FILL — HYDROXYUREA 500 MG CAPSULE: 500 mg | ORAL | Qty: 1

## 2012-05-25 MED FILL — NICOTINE 14 MG/24 HR DAILY PATCH: 14 mg/24 hr | TRANSDERMAL | Qty: 1

## 2012-05-25 MED FILL — NEPHROCAPS 1 MG CAPSULE: 1 mg | ORAL | Qty: 1

## 2012-05-25 MED FILL — VANCOMYCIN 500 MG IV SOLR: 500 mg | INTRAVENOUS | Qty: 500

## 2012-05-25 MED FILL — INSULIN GLARGINE 100 UNIT/ML INJECTION: 100 unit/mL | SUBCUTANEOUS | Qty: 0.08

## 2012-05-25 MED FILL — LISINOPRIL 10 MG TAB: 10 mg | ORAL | Qty: 1

## 2012-05-25 NOTE — Progress Notes (Addendum)
Tyler Ballard , MD   Blackberry number/text: [phone]    After 7pm please call operator for physician on call         Hospitalist Progress Note            Daily Progress Note: 05/25/2012    PCP-  Phys Other, MD    Assessment/Plan:   1. LLB pneumonia ,poa- On zosyn and levaquin , Follow Cx and modify abx   2. Sickle cell crisis , poa - Pt  Being managed with IV abx , dilaudid PCA, encourge po hydration, 02 as needed . Continue Hydrea .   3. ESRD on HD -  HD as per nephrology    4. Anemia - Hb 7.5-7.9 , hematology following , monitor   5. Hyponatremia - monitor   6. HTN- stable on  home meds   7. DM- resume lantus, ssi   8. Tobaco abuse - nicotine, counseled   9. Chronic pain - pain management as per hematology   10. DVT prophylaxis- heparin  11. Plan- Home on po abx soon .        Subjective:     The patient seen and examined , on HD . Still c/o pain   Case reviewed with RN .      Review of Systems:       Symptom Y/N Comments  Symptom Y/N Comments   Fever/Chills n   Chest Pain n    Poor Appetite    Edema     Cough n   Abdominal Pain n    Sputum n   Joint Pain     SOB/DOE n   Pruritis/Rash     Nausea/vomit    Tolerating PT/OT     Diarrhea    Tolerating Diet y    Constipation    Other       Could not obtain due to:      Objective:     VITALS:   Last 24hrs VS reviewed since prior progress note. Most recent are:  Visit Vitals   Item Reading   ??? BP 141/95   ??? Pulse 83   ??? Temp 97.5 ??F (36.4 ??C)   ??? Resp 18   ??? Ht 5\' 5"  (1.651 m)   ??? Wt 185 lb 6.5 oz   ??? BMI 30.85 kg/m2   ??? SpO2 95%     Temp (24hrs), Avg:96.9 ??F (36.1 ??C), Min:95.6 ??F (35.3 ??C), Max:97.5 ??F (36.4 ??C)      Intake/Output Summary (Last 24 hours) at 05/25/12 1320  Last data filed at 05/25/12 0945   Gross per 24 hour   Intake 478.75 ml   Output      0 ml   Net 478.75 ml      Wt Readings from Last 3 Encounters:   05/25/12 185 lb 6.5 oz       Oxygen Therapy:  Oxygen Therapy  O2 Sat (%): 95 % (05/25/12 0500)  Pulse via Oximetry: 91  beats per minute (05/24/12 1145)  O2 Device: Room air (05/25/12 0500)  O2 Flow Rate (L/min): 2 l/min (05/24/12 1208)    PHYSICAL EXAM:  General: Well nourished, calm, appropriate, NAD   HEENT: PERRLA bilaterally, neck supple, no JVD, no thyromegaly, no lymphadenopathy   CVS: RRR, no murmurs, rubs or gallops   Lungs: diminished left base otherwise clear to auscultation, no wheezing, rales or rhonchi   GI: soft nontender, NABS. No rebound or guarding   EXT: 2+ pulses bilaterally, trace  edema, fistula right arm   Neuro: Cranial nerves intact, strength 5/5 in all 4 extremities, no gross focal neuro deficits, AAO X3   Skin: intact   Psych: normal affect  Additional  Comments : none       PMH/SH reviewed - no change compared to H&P    Data Review:     Lab Data Personally Reviewed: (see below)    Recent Days:  Recent Labs   [facility] 05/25/12 0941 05/24/12 0715    WBC 2.7* 2.9*    HGB 7.5* 7.9*    HCT 23.5* 24.6*    PLT 155 183     Recent Labs   Basename 05/25/12 0941 05/24/12 0715    NA 134* 134*    K 4.6 4.1    CL 93* 94*    CO2 30 28    GLU 121* 116*    BUN 42* 33*    CREA 8.06* 6.00*    CA 7.8* 9.1    MG 2.2 --    PHOS 5.9* --    ALB 3.2* 3.3*    TBIL -- 0.5    SGOT -- 27    INR -- --     No results found for this basename: PH:3,PCO2:3,PO2:3,HCO3:3,FIO2:3 in the last 72 hours    CXR-( 05/24/12) FINDINGS:   PA and lateral radiographs of the chest demonstrate left lower   lobe infiltrate. The heart size is borderline. A left subclavian vascular   stent is noted. There is osteolysis of both distal clavicles and a relative   osteopenia.  IMPRESSION: Left lower lobe pneumonia. Distal clavicle osteolysis can be   secondary to chronic repetitive trauma. but hyperparathyroidism should be   excluded          Principal Problem:   *Sickle cell crisis (05/24/2012)      Recent Results (from the past 24 hour(s))   GLUCOSE, POC    Collection Time    05/24/12  9:10 PM       Component Value Range    POC GLUCOSE 111 (*) 75 - 110 mg/dL     Performed by Posey Pronto     GLUCOSE, POC    Collection Time    05/25/12  6:28 AM       Component Value Range    POC GLUCOSE 109  75 - 110 mg/dL    Performed by Posey Pronto     RENAL FUNCTION PANEL    Collection Time    05/25/12  9:41 AM       Component Value Range    Sodium 134 (*) 136 - 145 MMOL/L    Potassium 4.6  3.5 - 5.1 MMOL/L    Chloride 93 (*) 97 - 108 MMOL/L    CO2 30  21 - 32 MMOL/L    Anion gap 11  5 - 15 mmol/L    Glucose 121 (*) 65 - 100 MG/DL    BUN 42 (*) 6 - 20 MG/DL    Creatinine 4.78 (*) 0.45 - 1.15 MG/DL    BUN/Creatinine ratio 5 (*) 12 - 20      GFR est AA 10 (*) >60 ml/min/1.23m2    GFR est non-AA 8 (*) >60 ml/min/1.20m2    Calcium 7.8 (*) 8.5 - 10.1 MG/DL    Phosphorus 5.9 (*) 2.5 - 4.9 MG/DL    Albumin 3.2 (*) 3.5 - 5.0 g/dL   MAGNESIUM    Collection Time    05/25/12  9:41 AM  Component Value Range    Magnesium 2.2  1.6 - 2.4 MG/DL   CBC WITH AUTOMATED DIFF    Collection Time    05/25/12  9:41 AM       Component Value Range    WBC 2.7 (*) 4.1 - 11.1 K/uL    RBC 2.40 (*) 4.10 - 5.70 M/uL    HGB 7.5 (*) 12.1 - 17.0 g/dL    HCT 62.1 (*) 30.8 - 50.3 %    MCV 97.9  80.0 - 99.0 FL    MCH 31.3  26.0 - 34.0 PG    MCHC 31.9  30.0 - 36.5 g/dL    RDW 65.7 (*) 84.6 - 14.5 %    PLATELET 155  150 - 400 K/uL    NEUTROPHILS 52  32 - 75 %    LYMPHOCYTES 31  12 - 49 %    MONOCYTES 12  5 - 13 %    EOSINOPHILS 5  0 - 7 %    BASOPHILS 0  0 - 1 %    ABS. NEUTROPHILS 1.4 (*) 1.8 - 8.0 K/UL    ABS. LYMPHOCYTES 0.8  0.8 - 3.5 K/UL    ABS. MONOCYTES 0.3  0.0 - 1.0 K/UL    ABS. EOSINOPHILS 0.1  0.0 - 0.4 K/UL    ABS. BASOPHILS 0.0  0.0 - 0.1 K/UL   GLUCOSE, POC    Collection Time    05/25/12 11:31 AM       Component Value Range    POC GLUCOSE 120 (*) 75 - 110 mg/dL    Performed by Rondell Reams         Cultures:   Lab Results   Component Value Date/Time    Specimen Description: BLOOD 05/24/2012 10:47 AM     Lab Results   Component Value Date/Time    Culture result: NO GROWTH AFTER 19 HOURS 05/24/2012 10:47 AM         Medications  reviewed  Medications list Personally Reviewed:  YES     Current Facility-Administered Medications   Medication Dose Route Frequency   ??? HYDROmorphone (PF) 15 mg/30 ml (DILAUDID) PCA   IntraVENous CONTINUOUS   ??? levofloxacin (LEVAQUIN) 500 mg in D5W IVPB  500 mg IntraVENous Q48H   ??? piperacillin-tazobactam (ZOSYN) 2.25 g in 0.9% sodium chloride (MBP/ADV) 50 mL MBP  2.25 g IntraVENous Q8H   ??? amLODIPine (NORVASC) tablet 5 mg  5 mg Oral DAILY   ??? calcium acetate (PHOSLO) tablet 1,334 mg  2 Tab Oral TID WITH MEALS   ??? hydroxyurea (HYDREA) chemo cap 500 mg  500 mg Oral DAILY   ??? lisinopril (PRINIVIL, ZESTRIL) tablet 10 mg  10 mg Oral DAILY   ??? sodium chloride (NS) flush 5-10 mL  5-10 mL IntraVENous Q8H   ??? sodium chloride (NS) flush 5-10 mL  5-10 mL IntraVENous PRN   ??? HYDROmorphone (PF) (DILAUDID) injection 1 mg  1 mg IntraVENous Q3H PRN   ??? heparin (porcine) injection 5,000 Units  5,000 Units SubCUTAneous Q8H   ??? 0.9% sodium chloride infusion  75 mL/hr IntraVENous CONTINUOUS   ??? B complex-vitaminC-folic acid (NEPHROCAP) cap  1 Cap Oral DAILY   ??? vancomycin (VANCOCIN) 2000 mg in NS 500 ml infusion  2,000 mg IntraVENous NOW   ??? Vancomycin pharmacy dosing  1 Each Other PRN   ??? vancomycin (VANCOCIN) 500 mg in 0.9% sodium chloride (MBP/ADV) 100 mL MBP  500 mg IntraVENous DIALYSIS PRN   ??? diphenhydrAMINE (BENADRYL)  injection 12.5 mg  12.5 mg IntraVENous Q6H PRN   ??? nicotine (NICODERM CQ) 14 mg/24 hr patch 1 Patch  1 Patch TransDERmal Q24H   ??? insulin glargine (LANTUS) injection 8 Units  8 Units SubCUTAneous DAILY   ??? insulin lispro (HUMALOG) injection   SubCUTAneous AC&HS   ??? glucose chewable tablet 16 g  4 Tab Oral PRN   ??? dextrose (D50W) injection Syrg 12.5-25 g  12.5-25 g IntraVENous PRN   ??? glucagon (GLUCAGEN) injection 1 mg  1 mg IntraMUSCular PRN         Care Plan discussed with:       Patient x    Family      RN x    Care Manager                    Consultant:  x          Code Status:  Full Code x   DNR/DNI       Total NON critical care TIME:  30  Minutes    Total CRITICAL CARE TIME Spent:   Minutes      Comments   >50% of visit spent in counseling and coordination of care     ______________________________________________________________________  Procedures: see electronic medical records for all procedures/Xrays and details which were not copied into this note but were reviewed prior to creation of Plan.      DVT Prophylaxis  :   GI prophylaxis     :      Total time spent with patient: 30 minutes.    Tyler Ballard [place], MD

## 2012-05-25 NOTE — Progress Notes (Signed)
Hematology-Oncology Progress Note    Tyler Ballard  [birth year]  [id number]  05/25/2012    Follow-up for: sickle cell anemia     [x]         Chart notes since last visit reviewed   [x]         Medications reviewed for allergies and interactions       Case discussed with the following:         []         Case Manager                    [x]         Nursing Staff                                                                         []         Pathologist                                                                        []         FAMILY      Subjective:     Spoke with patient who complains of: pt. Had a rough night , had back pain poorly responsive to current pca settings    Objective:   Patient Vitals for the past 24 hrs:   BP Temp Pulse Resp SpO2 Height Weight   05/25/12 0945 153/90 mmHg - 87  - - - -   05/25/12 0500 192/96 mmHg 97.5 ??F (36.4 ??C) 95  18  95 % - 185 lb 6.5 oz   05/24/12 2335 154/87 mmHg 97.3 ??F (36.3 ??C) 101  20  96 % - -   05/24/12 1928 123/69 mmHg 97.1 ??F (36.2 ??C) 113  20  98 % - -   05/24/12 1705 167/89 mmHg 95.6 ??F (35.3 ??C) 98  20  98 % - -   05/24/12 1513 - - - - 99 % - -   05/24/12 1208 154/85 mmHg 98.1 ??F (36.7 ??C) 92  18  98 % 5\' 5"  (1.651 m) 185 lb 6.5 oz   05/24/12 1145 145/94 mmHg 98.1 ??F (36.7 ??C) - 18  98 % - -   05/24/12 1115 153/92 mmHg - - 18  97 % - -   05/24/12 1100 163/81 mmHg - - 19  97 % - -       REVIEW OF SYSTEMS:    Constitutional: negative fever, negative chills, negative weight loss  Eyes:   negative visual changes  ENT:   negative sore throat, tongue or lip swelling  Respiratory:  negative cough, negative dyspnea  Cards:  negative for chest pain, palpitations, lower extremity edema  GI:   negative for nausea, vomiting, diarrhea, and abdominal pain  Neuro:  negative for headaches, dizziness, vertigo  [x]                         Full ROS  o/w normal/non contributor    Constitutional:  Patient looks  []         Sick  []         Frail  [x]         Better                                                  []         Depressed    HEENT:  [x]    NC                         []    AT               []     ALOPECIA           Eyes: [x]    Normal               []     Icteric  Oropharynx: [x]     Normal                  []   Thrush               []    Dry  Mucositis: [x]     None                 Grade: []         I  []         II  []         III  []         IV  Neck:   [x]    Supple                  []   Rigid               JVD:    [x]    ABSENT       []    PRESENT  Lymphadenopathy:   []    None Noted            []    PRESENT    Chest:  []    Clear               []     Rhonchi                      Dec'd @     []   Right Base           []    Left Base    CV:             []    Regular              []   Irregular               []    Tachy                []    Murmur  Abdominal:   [x]     Soft              []    NON-tender               []    Tender      BS:    [x]    ABSENT                   []   PRESENT  Liver:     [x]   NON-palp                  []    EDGE- palp  Spleen: [x]    NON-palp                   []   EDGE - palp  Mass:   [x]    ABSENT                          []   PRESENT  Extr:    []   Lymphedema             []    Cyanosis      []   Clubbing  Edema:     []    NONE       []    PRESENT  Skin:  Intact [x]            Purpura []         Rash: [x]    ABSENT       []   PRESENT  Neuro:  [x]         Normal  []         Confused      Available labs reviewed:  Labs:    Recent Results (from the past 24 hour(s))   LACTIC ACID, PLASMA    Collection Time    05/24/12 10:47 AM       Component Value Range    Lactic acid 0.8  0.4 - 2.0 MMOL/L   CULTURE, BLOOD, PAIRED    Collection Time    05/24/12 10:47 AM       Component Value Range    Specimen Description: BLOOD      Special Requests: NO SPECIAL REQUESTS      Culture result: NO GROWTH AFTER 19 HOURS      Report Status PENDING     GLUCOSE, POC    Collection Time    05/24/12  9:10 PM       Component Value Range    POC GLUCOSE 111 (*) 75 - 110 mg/dL    Performed by Posey Pronto     GLUCOSE, POC    Collection Time    05/25/12   6:28 AM       Component Value Range    POC GLUCOSE 109  75 - 110 mg/dL    Performed by Posey Pronto     CBC WITH AUTOMATED DIFF    Collection Time    05/25/12  9:41 AM       Component Value Range    WBC 2.7 (*) 4.1 - 11.1 K/uL    RBC 2.40 (*) 4.10 - 5.70 M/uL    HGB 7.5 (*) 12.1 - 17.0 g/dL    HCT 78.4 (*) 69.6 - 50.3 %    MCV 97.9  80.0 - 99.0 FL    MCH 31.3  26.0 - 34.0 PG    MCHC 31.9  30.0 - 36.5 g/dL    RDW 29.5 (*) 28.4 - 14.5 %    PLATELET 155  150 - 400 K/uL    NEUTROPHILS 52  32 - 75 %    LYMPHOCYTES 31  12 - 49 %    MONOCYTES 12  5 - 13 %    EOSINOPHILS 5  0 - 7 %    BASOPHILS 0  0 - 1 %    ABS. NEUTROPHILS 1.4 (*) 1.8 - 8.0 K/UL  ABS. LYMPHOCYTES 0.8  0.8 - 3.5 K/UL    ABS. MONOCYTES 0.3  0.0 - 1.0 K/UL    ABS. EOSINOPHILS 0.1  0.0 - 0.4 K/UL    ABS. BASOPHILS 0.0  0.0 - 0.1 K/UL       Available Xrays reviewed:    Chemotherapy monitored and toxicities assessed:    Assessment and Plan   1. Sickle cell pain crisis... Probably precipitated by pneumonia... Will adjust pca settings  2. Anemia.. Secondary to #1 +/_ esrd... Will transfuse prn hgb <7.5  3. Pneumonia... Cultures negative so far, will continue antibiotics  Harlen Labs, MD

## 2012-05-25 NOTE — Progress Notes (Signed)
Followup for ESRD.  Sleeping in a chair as I entered - says that it stretches out his back and is more comfortable.  Agrees that pain control is adequate.  No new c/o.     No jvd. Regular heart. 1+ LE peripheral edema. Access feels and soundsOK.    No changes for now. Dialysis support. Hydrea after    LABS:   Recent Results (from the past 24 hour(s))   LACTIC ACID, PLASMA    Collection Time    05/24/12 10:47 AM       Component Value Range    Lactic acid 0.8  0.4 - 2.0 MMOL/L   CULTURE, BLOOD, PAIRED    Collection Time    05/24/12 10:47 AM       Component Value Range    Specimen Description: BLOOD      Special Requests: NO SPECIAL REQUESTS      Culture result: NO GROWTH AFTER 19 HOURS      Report Status PENDING     GLUCOSE, POC    Collection Time    05/24/12  9:10 PM       Component Value Range    POC GLUCOSE 111 (*) 75 - 110 mg/dL    Performed by Posey Pronto     GLUCOSE, POC    Collection Time    05/25/12  6:28 AM       Component Value Range    POC GLUCOSE 109  75 - 110 mg/dL    Performed by Posey Pronto         Patient Vitals for the past 12 hrs:   Temp Pulse Resp BP SpO2   05/25/12 0500 97.5 ??F (36.4 ??C) 95  18  192/96 mmHg 95 %   05/24/12 2335 97.3 ??F (36.3 ??C) 101  20  154/87 mmHg 96 %

## 2012-05-25 NOTE — Procedures (Signed)
[facility]                         [phone]  Vitals Pre Post Assessment Pre Post   BP 153/90 148/90 LOC A&Ox4 A&Ox4   HR 87bpm 91bpm Pain denies denies   Temp 97.6 97.5 Lungs Diminishedx5, even & unlabored clear X5, even and unlabored   Resp 18/min 18/min Cardiac Reg rate & rhythm Reg rate & rthythm   Weight 84Kg 79.5Kg Skin Warm & dry Warm & dry      Edema bilat LEs +2 noted bilat LEs +2 noted     Orders   Duration: Start: 0945 End: 1345 Total: 4 hrs   Dialyzer: Gambro Revaclear   K Bath: 3   Ca Bath: 2   Na / Bicarb: 140/35 bicarb   Target Fluid Removal: TMP as tolerated     Access   Type: AV fistula   Location: RUA   Comments: Bruit and thrill positive and present pre-tx prepped per protocol and easily cannulated with 2-15G AV fistula needles with good flows upon aspiration.  Fistula site exhibits no redness, no edema, no discharge, no palor, no odor, nor heat.  Post-tx all possible blood returned via full rinseback.  Both needles removed without excessive bleeding.  Bruit and thrill positive and present post-tx.                                Labs   Obtained/Reviewed  Critical Results Called CBC, Renal panel drawn and sent pre-tx.       Meds Given   Name Dose Route   None ordered                 Total Liters Process: 91.9   Net Fluid Removed: 4.5 L      Comments   tx progressed well and without incident.  Writer left pt in no new acute distress and denying complaints with stable vss, bed in low position with side rails up x 2, wheels locked x4, and call bell within reach.  Reported off to [employer] Arletha Grippe RN.

## 2012-05-25 NOTE — Procedures (Signed)
Procedures by Jettie Pagan, RN at 05/25/12 1418                Author: Jettie Pagan, RN  Service: --  Author Type: Registered Nurse       Filed: 05/25/12 1423  Date of Service: 05/25/12 1418  Status: Signed          Editor: Jettie Pagan, RN (Registered Nurse)            Procedures        1. HEMODIALYSIS INPATIENT [DIA12 (Custom)]                                            [facility]                         [phone]          Vitals  Pre  Post  Assessment  Pre  Post            BP  153/90  148/90  LOC  A&Ox4  A&Ox4            HR  87bpm  91bpm  Pain  denies  denies     Temp  97.6  97.5  Lungs  Diminishedx5, even & unlabored  clear X5, even and unlabored     Resp  18/min  18/min  Cardiac  Reg rate & rhythm  Reg rate & rthythm     Weight  84Kg  79.5Kg  Skin  Warm & dry  Warm & dry                  Edema  bilat LEs +2 noted  bilat LEs +2 noted          Orders             Duration:  Start:  0945  End:  1345  Total:  4 hrs        Dialyzer:  Gambro Revaclear     K Bath:  3     Ca Bath:  2     Na / Bicarb:  140/35 bicarb        Target Fluid Removal:  TMP as tolerated          Access        Type:  AV fistula        Location:  RUA       Comments: Bruit and thrill positive and present pre-tx prepped per protocol and easily cannulated with 2-15G AV fistula needles with good flows  upon aspiration.  Fistula site exhibits no redness, no edema, no discharge, no palor, no odor, nor heat.  Post-tx all possible blood returned via full rinseback.  Both needles removed without excessive bleeding.  Bruit and thrill positive and present  post-tx.                                     Labs        Obtained/Reviewed   Critical Results Called  CBC, Renal panel drawn and sent pre-tx.             Meds Given         Name  Dose  Route  None ordered                                         Total Liters Process:  91.9        Net Fluid Removed:  4.5 L           Comments       tx progressed well and  without incident.  Writer left pt in no new acute distress and denying complaints with stable vss, bed in low position with  side rails up x 2, wheels locked x4, and call bell within reach.  Reported off to [employer] Arletha Grippe RN.

## 2012-05-25 NOTE — Consults (Signed)
Palliative Medicine    Pain being managed by Oncology. Will sign off. Please re-consult if needed.    Also note:  Our office called the PCP name that he gave me: Dr. Dennard Nip Day in [place], [place] [phone]). He has not seen the patient since 2009 and has not prescribed medications. He has been receiving ED communications/multiple discharge summaries from [facility]; [facility]; [place]). Also, the names he gave me for Nephrology and for Hematology could not be located.     Thank you for the opportunity to be involved in his care.    Penelope Coop, MD

## 2012-05-25 NOTE — Consults (Signed)
Subjective:   Date of Consultation:  May 25, 2012  Referring Physician: Dr.Nepal  Reason for Consultation: pneumonia    HPI: Patient is a 31 y.o. male who is being seen for possible pneumonia. Admitted with Sickle crisis. Noted to have possible pulmonary infiltrate. Started on zosyn and levaquin. Not much cough, not SOB. No fever or chills.     History reviewed. No pertinent family history.   History   Substance Use Topics   ??? Smoking status: Current Everyday Smoker -- 0.2 packs/day for 10 years   ??? Smokeless tobacco: Never Used   ??? Alcohol Use: No       No Known Allergies     Review of Systems:  Constitutional: positive for fatigue  Ears, Nose, Mouth, Throat, and Face: negative  Respiratory: positive for cough  Cardiovascular: negative  Gastrointestinal: negative  Genitourinary:negative  Musculoskeletal:positive for bone pain  Neurological: negative  Behavioral/Psychiatric: negative    Objective:   Blood pressure 166/85, pulse 93, temperature 98.1 ??F (36.7 ??C), resp. rate 20, height 5\' 5"  (1.651 m), weight 84.1 kg (185 lb 6.5 oz), SpO2 96.00%.  Temp (24hrs), Avg:97.5 ??F (36.4 ??C), Min:97.1 ??F (36.2 ??C), Max:98.1 ??F (36.7 ??C)    Current Facility-Administered Medications   Medication Dose Route Frequency   ??? HYDROmorphone (PF) 15 mg/30 ml (DILAUDID) PCA   IntraVENous CONTINUOUS   ??? hydroxyurea (HYDREA) chemo cap 500 mg  500 mg Oral Q MON, WED & FRI   ??? levofloxacin (LEVAQUIN) 500 mg in D5W IVPB  500 mg IntraVENous Q48H   ??? amLODIPine (NORVASC) tablet 5 mg  5 mg Oral DAILY   ??? calcium acetate (PHOSLO) tablet 1,334 mg  2 Tab Oral TID WITH MEALS   ??? lisinopril (PRINIVIL, ZESTRIL) tablet 10 mg  10 mg Oral DAILY   ??? sodium chloride (NS) flush 5-10 mL  5-10 mL IntraVENous Q8H   ??? sodium chloride (NS) flush 5-10 mL  5-10 mL IntraVENous PRN   ??? HYDROmorphone (PF) (DILAUDID) injection 1 mg  1 mg IntraVENous Q3H PRN   ??? heparin (porcine) injection 5,000 Units  5,000 Units SubCUTAneous Q8H   ??? 0.9% sodium chloride infusion   75 mL/hr IntraVENous CONTINUOUS   ??? B complex-vitaminC-folic acid (NEPHROCAP) cap  1 Cap Oral DAILY   ??? Vancomycin pharmacy dosing  1 Each Other PRN   ??? vancomycin (VANCOCIN) 500 mg in 0.9% sodium chloride (MBP/ADV) 100 mL MBP  500 mg IntraVENous DIALYSIS PRN   ??? diphenhydrAMINE (BENADRYL) injection 12.5 mg  12.5 mg IntraVENous Q6H PRN   ??? nicotine (NICODERM CQ) 14 mg/24 hr patch 1 Patch  1 Patch TransDERmal Q24H   ??? insulin glargine (LANTUS) injection 8 Units  8 Units SubCUTAneous DAILY   ??? insulin lispro (HUMALOG) injection   SubCUTAneous AC&HS   ??? glucose chewable tablet 16 g  4 Tab Oral PRN   ??? dextrose (D50W) injection Syrg 12.5-25 g  12.5-25 g IntraVENous PRN   ??? glucagon (GLUCAGEN) injection 1 mg  1 mg IntraMUSCular PRN        Exam:  General:  alert, cooperative, mild distress, appears stated age  Neurologic:  oriented  Neck:  normal and no erythema or exudates noted.   Lungs:  clear to auscultation bilaterally  Heart:  regular rate and rhythm  Abdomen:  soft, non-tender. Bowel sounds normal. No masses,  no organomegaly    Data Review:   CBC:   Recent Labs   [facility] 05/25/12 0941 05/24/12 0715  WBC 2.7* 2.9*    RBC 2.40* 2.53*    HGB 7.5* 7.9*    HCT 23.5* 24.6*    PLT 155 183    GRANS 52 72    LYMPH 31 12    EOS 5 1     CMP:   Recent Labs   [facility] 05/25/12 0941 05/24/12 0715    GLU 121* 116*    NA 134* 134*    K 4.6 4.1    CL 93* 94*    CO2 30 28    BUN 42* 33*    CREA 8.06* 6.00*    CA 7.8* 9.1    AGAP 11 12    BUCR 5* 6*    TBIL -- 0.5    GPT -- 33    AP -- 106    TP -- 9.9*    ALB 3.2* 3.3*    GLOB -- 6.6*    AGRAT -- 0.5*       Microbiology:    Lab Results   Component Value Date/Time    Specimen Description: BLOOD 05/24/2012 10:47 AM     Lab Results   Component Value Date/Time    Culture result: NO GROWTH AFTER 19 HOURS 05/24/2012 10:47 AM         Impression:   ?? Possible pneumonia - no obvious clinical findings on exam  ?? Sickle crisis  ?? Leukopenia could be from Hydroxyurea too.    Plan:   Stop zosyn.  Continue levaquin.     Thank You    Signed By: Filbert Schilder, MD     May 25, 2012

## 2012-05-25 NOTE — Progress Notes (Addendum)
NUTRITION    BEST PRACTICE ALERT:   Braden Score: 23      Nutrition: Excellent      RD PLAN:   [facility] Nutrition Screening Referral received secondary to "poor oral intake" and "unplanned weight loss".  Chart reviewed, discussed with RN.  Pt admitted with sickle cell crisis.  PMHx: sickle cell disease, ESRD on HD, others noted.  Pt was on a renal diet yesterday, but was demanding alternate items (noted pt is non-compliant with renal diet).  Diet order was changed to regular and, per RN, pt has a great appetite and is eating/drinking very well.  Current weight (185#, standing scale) is ADL with BMI of: 31; however, unsure of dry weight (?175# -- his stated weight). Would continue with daily weights to monitor trends.  Mininmal nutrition risk identified at this time.  Patient likely to meet nutrition needs without additional interventions.  Will see again for nutrition rescreen as indicated.    Freddi Che, RD

## 2012-05-26 LAB — GLUCOSE, POC
Glucose (POC): 93 mg/dL (ref 75–110)
Glucose (POC): 88 mg/dL (ref 75–110)
Glucose (POC): 106 mg/dL (ref 75–110)
Glucose (POC): 94 mg/dL (ref 75–110)

## 2012-05-26 LAB — CBC W/O DIFF
HCT: 27.3 % — ABNORMAL LOW (ref 36.6–50.3)
HGB: 8.5 g/dL — ABNORMAL LOW (ref 12.1–17.0)
MCH: 31 PG (ref 26.0–34.0)
MCHC: 31.1 g/dL (ref 30.0–36.5)
MCV: 99.6 FL — ABNORMAL HIGH (ref 80.0–99.0)
PLATELET: 157 10*3/uL (ref 150–400)
RBC: 2.74 M/uL — ABNORMAL LOW (ref 4.10–5.70)
RDW: 14.5 % (ref 11.5–14.5)
WBC: 2.4 10*3/uL — ABNORMAL LOW (ref 4.1–11.1)

## 2012-05-26 LAB — EKG, 12 LEAD, INITIAL
Atrial Rate: 81 {beats}/min
Calculated P Axis: 38 degrees
Calculated R Axis: 2 degrees
Calculated T Axis: 75 degrees
Diagnosis: NORMAL
P-R Interval: 184 ms
Q-T Interval: 438 ms
QRS Duration: 86 ms
QTC Calculation (Bezet): 508 ms
Ventricular Rate: 81 {beats}/min

## 2012-05-26 MED ADMIN — sodium chloride (NS) flush 5-10 mL: INTRAVENOUS | @ 02:00:00 | NDC 82903065462

## 2012-05-26 MED ADMIN — Vancomycin - Pharmacy to Dose: @ 21:00:00

## 2012-05-26 MED ADMIN — HYDROmorphone (PF) (DILAUDID) injection 1 mg: INTRAVENOUS | @ 23:00:00 | NDC 00409255201

## 2012-05-26 MED ADMIN — HYDROmorphone (PF) (DILAUDID) injection 1 mg: INTRAVENOUS | @ 13:00:00 | NDC 00409255201

## 2012-05-26 MED ADMIN — HYDROmorphone (PF) 15 mg/30 ml (DILAUDID) PCA: INTRAVENOUS | @ 14:00:00 | NDC 02420030164

## 2012-05-26 MED ADMIN — insulin glargine (LANTUS) injection 8 Units: SUBCUTANEOUS | @ 13:00:00 | NDC 09999222001

## 2012-05-26 MED ADMIN — sodium chloride (NS) flush 5-10 mL: INTRAVENOUS | @ 06:00:00 | NDC 87701099893

## 2012-05-26 MED ADMIN — heparin (porcine) injection 5,000 Units: SUBCUTANEOUS | @ 17:00:00 | NDC 25021040201

## 2012-05-26 MED ADMIN — sodium chloride (NS) flush 5-10 mL: INTRAVENOUS | @ 03:00:00 | NDC 87701099893

## 2012-05-26 MED ADMIN — 0.9% sodium chloride infusion: INTRAVENOUS | @ 04:00:00 | NDC 00409798309

## 2012-05-26 MED ADMIN — calcium acetate (PHOSLO) tablet 1,334 mg: ORAL | @ 21:00:00 | NDC 00574011302

## 2012-05-26 MED ADMIN — sodium chloride (NS) flush 5-10 mL: INTRAVENOUS | @ 10:00:00 | NDC 87701099893

## 2012-05-26 MED ADMIN — HYDROmorphone (PF) (DILAUDID) injection 1 mg: INTRAVENOUS | @ 16:00:00 | NDC 00409255201

## 2012-05-26 MED ADMIN — lisinopril (PRINIVIL, ZESTRIL) tablet 10 mg: ORAL | @ 13:00:00 | NDC 68084006111

## 2012-05-26 MED ADMIN — calcium acetate (PHOSLO) tablet 1,334 mg: ORAL | @ 13:00:00 | NDC 00574011302

## 2012-05-26 MED ADMIN — HYDROmorphone (PF) (DILAUDID) injection 1 mg: INTRAVENOUS | @ 03:00:00 | NDC 00409255201

## 2012-05-26 MED ADMIN — diphenhydrAMINE (BENADRYL) injection 12.5 mg: INTRAVENOUS | @ 16:00:00 | NDC 00641037621

## 2012-05-26 MED ADMIN — HYDROmorphone (PF) 15 mg/30 ml (DILAUDID) PCA: INTRAVENOUS | @ 02:00:00 | NDC 02420030164

## 2012-05-26 MED ADMIN — HYDROmorphone (PF) (DILAUDID) injection 1 mg: INTRAVENOUS | @ 19:00:00 | NDC 00409255201

## 2012-05-26 MED ADMIN — HYDROmorphone (PF) 15 mg/30 ml (DILAUDID) PCA: INTRAVENOUS | @ 10:00:00 | NDC 02420030164

## 2012-05-26 MED ADMIN — heparin (porcine) injection 5,000 Units: SUBCUTANEOUS | @ 09:00:00 | NDC 25021040201

## 2012-05-26 MED ADMIN — sodium chloride (NS) flush 5-10 mL: INTRAVENOUS | @ 18:00:00 | NDC 87701099893

## 2012-05-26 MED ADMIN — levofloxacin (LEVAQUIN) 500 mg in D5W IVPB: INTRAVENOUS | @ 16:00:00 | NDC 25021013282

## 2012-05-26 MED ADMIN — 0.9% sodium chloride infusion: INTRAVENOUS | @ 16:00:00 | NDC 00409798309

## 2012-05-26 MED ADMIN — amLODIPine (NORVASC) tablet 5 mg: ORAL | @ 13:00:00 | NDC 59762153006

## 2012-05-26 MED ADMIN — heparin (porcine) injection 5,000 Units: SUBCUTANEOUS | @ 01:00:00 | NDC 25021040201

## 2012-05-26 MED ADMIN — diphenhydrAMINE (BENADRYL) injection 12.5 mg: INTRAVENOUS | @ 03:00:00 | NDC 00641037621

## 2012-05-26 MED ADMIN — diphenhydrAMINE (BENADRYL) injection 12.5 mg: INTRAVENOUS | @ 09:00:00 | NDC 00641037621

## 2012-05-26 MED ADMIN — HYDROmorphone (PF) (DILAUDID) injection 1 mg: INTRAVENOUS | @ 09:00:00 | NDC 00409255201

## 2012-05-26 MED ADMIN — HYDROmorphone (PF) (DILAUDID) injection 1 mg: INTRAVENOUS | @ 06:00:00 | NDC 00409255201

## 2012-05-26 MED FILL — LEVAQUIN 500 MG/100 ML IN 5% DEXTROSE INTRAVENOUS PIGGYBACK: 500 mg/100 mL | INTRAVENOUS | Qty: 100

## 2012-05-26 MED FILL — CALCIUM ACETATE 667 MG TAB: 667 mg | ORAL | Qty: 2

## 2012-05-26 MED FILL — BD POSIFLUSH NORMAL SALINE 0.9 % INJECTION SYRINGE: INTRAMUSCULAR | Qty: 10

## 2012-05-26 MED FILL — PHARMACY INFORMATION NOTE: Qty: 1

## 2012-05-26 MED FILL — HYDROMORPHONE 15 MG/30 ML (0.5 MG/ML) IN 0.9% SOD.CHLORIDE INFUSION: 15 mg/30 mL (0.5 mg/mL) | INTRAVENOUS | Qty: 30

## 2012-05-26 MED FILL — AMLODIPINE 5 MG TAB: 5 mg | ORAL | Qty: 1

## 2012-05-26 MED FILL — HYDROMORPHONE (PF) 1 MG/ML IJ SOLN: 1 mg/mL | INTRAMUSCULAR | Qty: 1

## 2012-05-26 MED FILL — HEPARIN (PORCINE) 5,000 UNIT/ML IJ SOLN: 5000 unit/mL | INTRAMUSCULAR | Qty: 1

## 2012-05-26 MED FILL — DIPHENHYDRAMINE HCL 50 MG/ML IJ SOLN: 50 mg/mL | INTRAMUSCULAR | Qty: 1

## 2012-05-26 MED FILL — INSULIN GLARGINE 100 UNIT/ML INJECTION: 100 unit/mL | SUBCUTANEOUS | Qty: 0.08

## 2012-05-26 MED FILL — NEPHROCAPS 1 MG CAPSULE: 1 mg | ORAL | Qty: 1

## 2012-05-26 MED FILL — NICOTINE 14 MG/24 HR DAILY PATCH: 14 mg/24 hr | TRANSDERMAL | Qty: 1

## 2012-05-26 MED FILL — LISINOPRIL 10 MG TAB: 10 mg | ORAL | Qty: 1

## 2012-05-26 NOTE — Progress Notes (Signed)
Remains afebrile  Still pain issue  Ambulating in room  No cough or SOB  Vitals noted  No new findings on exam  Chest bilateral air entry present  Labs noted  Blood cultures negative so far  Impression:  Sickle crisis  Possible pneumonia  Plan:  Change antibiotics to oral levaquin 2 more doses every 48 hrs.  Will see prn.

## 2012-05-26 NOTE — Progress Notes (Signed)
Spiritual Care Assessment/Progress Notes    Tyler Ballard [id number]  [id number]    08/10/1981  31 y.o.  male    Patient Telephone Number: [phone] (home)   Religious Affiliation: No religion   Language: English   Extended Emergency Contact Information  Primary Emergency Contact: Boddie,Jenita  Address: [address]           [place], [place] [address] [place]  Home Phone: [phone]  Relation: Other Relative   Patient Active Problem List   Diagnoses Date Noted   ??? Sickle cell crisis 05/24/2012        Date: 05/26/2012       Level of Religious/Spiritual Activity:  [x]          Involved in faith tradition/spiritual practice    []          Not involved in faith tradition/spiritual practice  [x]          Spiritually oriented    []          Claims no spiritual orientation    []          seeking spiritual identity  []          Feels alienated from religious practice/tradition  []          Feels angry about religious practice/tradition  [x]          Spirituality/religious tradition is a Theatre stage manager for coping at this time.  []          Not able to assess due to medical condition    Services Provided Today:  []          crisis intervention    []          reading Scriptures  [x]          spiritual assessment    [x]          prayer  [x]          empathic listening/emotional support  []          rites and rituals (cite in comments)  []          life review     []          religious support  []          theological development   []          advocacy  []          ethical dialog     []          blessing  []          bereavement support    []          support to family  []          anticipatory grief support   []          help with AMD  []          spiritual guidance    []          meditation      Spiritual Care Needs  []          Emotional Support  []          Spiritual/Religious Care  []          Loss/Adjustment  []          Advocacy/Referral /Ethics  [x]          No needs expressed at this time  []          Other: (note in comments)   Spiritual Care Plan  []   Follow up visits with pt/family  []          Provide materials  []          Schedule sacraments  []          Contact Community Clergy  [x]          Follow up as needed  []          Other: (note in comments)     Comments:   Consult was ordered on this pt re: AMD. Pt related that this was not a concern. Offered prayer and info re: pastoral care.  Visit by;  Lysbeth Penner, Chaplain, PRN,   Spiritual Care Pager [phone])

## 2012-05-26 NOTE — Progress Notes (Signed)
Pt verbally aggressive with staff and very demanding.

## 2012-05-26 NOTE — Progress Notes (Signed)
Hospitalist Progress Note          Mortimer Fries, MD  Pager # [phone]  Call physician on-call through the operator 7pm-7am    Daily Progress Note: 05/26/2012  PCP-   Phys Other, MD  Assessment/Plan:   1. LLB pneumonia, poa- On zosyn and Levaquin. Zosyn stopped per ID, cont levaquin, Follow Cx , no growth thus far  2. Sickle cell crisis, poa - Pt Being managed with IV abx , dilaudid PCA, encourge po hydration, 02 as needed . Continue Hydrea. PCA changed to PRN only today, planning to D/c PCA in AM  3. ESRD on HD - HD as per nephrology   4. Anemia - Hb 7.5-7.9 , hematology following , monitor, stable  5. Hyponatremia - monitor, stable  6. HTN- stable on home meds   7. DM- resume lantus, ssi   8. Tobaco abuse - nicotine, counseled   9. Chronic pain - pain management as per hematology   10. DVT prophylaxis- heparin  11. Plan- Home on po abx soon       Subjective:   Reports doing better, but still with pain and dyspnea, using oxygen at rest      Review of Systems:   A comprehensive review of systems was negative except for that written in the HPI.    Objective:   Physical Exam:     BP 180/93   Pulse 87   Temp 96.5 ??F (35.8 ??C)   Resp 18   Ht 5\' 5"  (1.651 m)   Wt 185 lb 10 oz   BMI 30.89 kg/m2   SpO2 98% O2 Flow Rate (L/min): 2 l/min O2 Device: Room air    Temp (24hrs), Avg:97.3 ??F (36.3 ??C), Min:95.7 ??F (35.4 ??C), Max:98.4 ??F (36.9 ??C)        06/06 1900 - 06/08 0659  In: 600 [P.O.:600]  Out: 4500     General: Well nourished, calm, appropriate, NAD   HEENT: PERRLA bilaterally, neck supple, no JVD, no thyromegaly, no lymphadenopathy   CVS: RRR, no murmurs, rubs or gallops   Lungs: diminished left base otherwise clear to auscultation, no wheezing, rales or rhonchi   GI: soft nontender, NABS. No rebound or guarding   EXT: 2+ pulses bilaterally, trace edema, fistula right arm   Neuro: Cranial nerves intact, strength 5/5 in all 4 extremities, no gross focal neuro deficits, AAO X3   Skin: intact   Psych: normal affect    Data  Review:       Recent Days:  Recent Labs   Basename 05/26/12 0300 05/25/12 0941 05/24/12 0715    WBC 2.4* 2.7* 2.9*    HGB 8.5* 7.5* 7.9*    HCT 27.3* 23.5* 24.6*    PLT 157 155 183     Recent Labs   Basename 05/25/12 0941 05/24/12 0715    NA 134* 134*    K 4.6 4.1    CL 93* 94*    CO2 30 28    GLU 121* 116*    BUN 42* 33*    CREA 8.06* 6.00*    CA 7.8* 9.1    MG 2.2 --    PHOS 5.9* --    ALB 3.2* 3.3*    TBIL -- 0.5    SGOT -- 27    INR -- --     No results found for this basename: PH:3,PCO2:3,PO2:3,HCO3:3,FIO2:3 in the last 72 hours    24 Hour Results:  Recent Results (from the past 24 hour(s))  RENAL FUNCTION PANEL    Collection Time    05/25/12  9:41 AM       Component Value Range    Sodium 134 (*) 136 - 145 MMOL/L    Potassium 4.6  3.5 - 5.1 MMOL/L    Chloride 93 (*) 97 - 108 MMOL/L    CO2 30  21 - 32 MMOL/L    Anion gap 11  5 - 15 mmol/L    Glucose 121 (*) 65 - 100 MG/DL    BUN 42 (*) 6 - 20 MG/DL    Creatinine 2.13 (*) 0.45 - 1.15 MG/DL    BUN/Creatinine ratio 5 (*) 12 - 20      GFR est AA 10 (*) >60 ml/min/1.52m2    GFR est non-AA 8 (*) >60 ml/min/1.33m2    Calcium 7.8 (*) 8.5 - 10.1 MG/DL    Phosphorus 5.9 (*) 2.5 - 4.9 MG/DL    Albumin 3.2 (*) 3.5 - 5.0 g/dL   MAGNESIUM    Collection Time    05/25/12  9:41 AM       Component Value Range    Magnesium 2.2  1.6 - 2.4 MG/DL   CBC WITH AUTOMATED DIFF    Collection Time    05/25/12  9:41 AM       Component Value Range    WBC 2.7 (*) 4.1 - 11.1 K/uL    RBC 2.40 (*) 4.10 - 5.70 M/uL    HGB 7.5 (*) 12.1 - 17.0 g/dL    HCT 08.6 (*) 57.8 - 50.3 %    MCV 97.9  80.0 - 99.0 FL    MCH 31.3  26.0 - 34.0 PG    MCHC 31.9  30.0 - 36.5 g/dL    RDW 46.9 (*) 62.9 - 14.5 %    PLATELET 155  150 - 400 K/uL    NEUTROPHILS 52  32 - 75 %    LYMPHOCYTES 31  12 - 49 %    MONOCYTES 12  5 - 13 %    EOSINOPHILS 5  0 - 7 %    BASOPHILS 0  0 - 1 %    ABS. NEUTROPHILS 1.4 (*) 1.8 - 8.0 K/UL    ABS. LYMPHOCYTES 0.8  0.8 - 3.5 K/UL    ABS. MONOCYTES 0.3  0.0 - 1.0 K/UL    ABS. EOSINOPHILS 0.1  0.0 -  0.4 K/UL    ABS. BASOPHILS 0.0  0.0 - 0.1 K/UL   GLUCOSE, POC    Collection Time    05/25/12 11:31 AM       Component Value Range    POC GLUCOSE 120 (*) 75 - 110 mg/dL    Performed by Rondell Reams     GLUCOSE, POC    Collection Time    05/25/12  5:20 PM       Component Value Range    POC GLUCOSE 121 (*) 75 - 110 mg/dL    Performed by Wyn Forster     GLUCOSE, POC    Collection Time    05/25/12  9:01 PM       Component Value Range    POC GLUCOSE 93  75 - 110 mg/dL    Performed by Marella Bile JASON     CBC W/O DIFF    Collection Time    05/26/12  3:00 AM       Component Value Range    WBC 2.4 (*) 4.1 - 11.1 K/uL    RBC 2.74 (*)  4.10 - 5.70 M/uL    HGB 8.5 (*) 12.1 - 17.0 g/dL    HCT 16.1 (*) 09.6 - 50.3 %    MCV 99.6 (*) 80.0 - 99.0 FL    MCH 31.0  26.0 - 34.0 PG    MCHC 31.1  30.0 - 36.5 g/dL    RDW 04.5  40.9 - 81.1 %    PLATELET 157  150 - 400 K/uL   GLUCOSE, POC    Collection Time    June 16, 2012  6:39 AM       Component Value Range    POC GLUCOSE 88  75 - 110 mg/dL    Performed by Lelon Perla Chrissy         Problem List:  Problem List as of 06/16/12  Never Reviewed      Codes Class Noted - Resolved    *Sickle cell crisis 282.62  05/24/2012 - Present              Medications reviewed  Current Facility-Administered Medications   Medication Dose Route Frequency   ??? HYDROmorphone (PF) 15 mg/30 ml (DILAUDID) PCA   IntraVENous CONTINUOUS   ??? hydroxyurea (HYDREA) chemo cap 500 mg  500 mg Oral Q MON, WED & FRI   ??? levofloxacin (LEVAQUIN) 500 mg in D5W IVPB  500 mg IntraVENous Q48H   ??? amLODIPine (NORVASC) tablet 5 mg  5 mg Oral DAILY   ??? calcium acetate (PHOSLO) tablet 1,334 mg  2 Tab Oral TID WITH MEALS   ??? lisinopril (PRINIVIL, ZESTRIL) tablet 10 mg  10 mg Oral DAILY   ??? sodium chloride (NS) flush 5-10 mL  5-10 mL IntraVENous Q8H   ??? sodium chloride (NS) flush 5-10 mL  5-10 mL IntraVENous PRN   ??? HYDROmorphone (PF) (DILAUDID) injection 1 mg  1 mg IntraVENous Q3H PRN   ??? heparin (porcine) injection 5,000 Units  5,000 Units SubCUTAneous Q8H    ??? 0.9% sodium chloride infusion  75 mL/hr IntraVENous CONTINUOUS   ??? B complex-vitaminC-folic acid (NEPHROCAP) cap  1 Cap Oral DAILY   ??? Vancomycin pharmacy dosing  1 Each Other PRN   ??? vancomycin (VANCOCIN) 500 mg in 0.9% sodium chloride (MBP/ADV) 100 mL MBP  500 mg IntraVENous DIALYSIS PRN   ??? diphenhydrAMINE (BENADRYL) injection 12.5 mg  12.5 mg IntraVENous Q6H PRN   ??? nicotine (NICODERM CQ) 14 mg/24 hr patch 1 Patch  1 Patch TransDERmal Q24H   ??? insulin glargine (LANTUS) injection 8 Units  8 Units SubCUTAneous DAILY   ??? insulin lispro (HUMALOG) injection   SubCUTAneous AC&HS   ??? glucose chewable tablet 16 g  4 Tab Oral PRN   ??? dextrose (D50W) injection Syrg 12.5-25 g  12.5-25 g IntraVENous PRN   ??? glucagon (GLUCAGEN) injection 1 mg  1 mg IntraMUSCular PRN       Care Plan discussed with: Patient/Family and Nurse    Total time spent with patient: 30 minutes.    Mortimer Fries, MD

## 2012-05-26 NOTE — Progress Notes (Signed)
Hematology-Oncology Progress Note    Tyler Ballard  07/31/1981  [id number]  05/26/2012    Follow-up for: sickle cell anemia     [x]         Chart notes since last visit reviewed   [x]         Medications reviewed for allergies and interactions       Case discussed with the following:         []         Case Manager                    [x]         Nursing Staff                                                                         []         Pathologist                                                                        []         FAMILY      Subjective:     Spoke with patient who complains of: pt. Had some sob last night, also had back pain but appears more comfortable    Objective:     Patient Vitals for the past 24 hrs:   BP Temp Pulse Resp SpO2 Weight   05/26/12 0822 180/93 mmHg 96.5 ??F (35.8 ??C) 87  18  98 % -   05/26/12 0238 164/82 mmHg 98.4 ??F (36.9 ??C) 96  20  97 % -   05/26/12 0219 - - - - - 185 lb 10 oz   05/25/12 2330 175/86 mmHg 98 ??F (36.7 ??C) 90  20  97 % -   05/25/12 2032 - - - - 97 % -   05/25/12 2015 149/83 mmHg 95.7 ??F (35.4 ??C) 85  20  97 % -   05/25/12 1552 166/85 mmHg 98.1 ??F (36.7 ??C) 93  20  96 % -   05/25/12 1346 148/90 mmHg - 91  - - -   05/25/12 1315 141/95 mmHg - 83  - - -   05/25/12 1245 167/95 mmHg - 79  - - -   05/25/12 1215 189/100 mmHg - 86  - - -   05/25/12 1145 192/97 mmHg - 91  - - -   05/25/12 1115 155/87 mmHg - 87  - - -   05/25/12 1045 150/87 mmHg - 87  - - -   05/25/12 1015 154/84 mmHg - 90  - - -   05/25/12 0945 153/90 mmHg - 87  - - -       REVIEW OF SYSTEMS:    Constitutional: negative fever, negative chills, negative weight loss  Eyes:   negative visual changes  ENT:   negative sore throat, tongue or lip swelling  Respiratory:  negative cough, negative dyspnea  Cards:  negative for chest pain, palpitations, lower extremity  edema  GI:   negative for nausea, vomiting, diarrhea, and abdominal pain  Neuro:  negative for headaches, dizziness, vertigo  [x]                         Full  ROS o/w normal/non contributor    Constitutional:  Patient looks  []         Sick  []         Frail  [x]         Better                                                 []         Depressed    HEENT:  [x]    NC                         []    AT               []     ALOPECIA           Eyes: [x]    Normal               []     Icteric  Oropharynx: [x]     Normal                  []   Thrush               []    Dry  Mucositis: [x]     None                 Grade: []         I  []         II  []         III  []         IV  Neck:   [x]    Supple                  []   Rigid               JVD:    [x]    ABSENT       []    PRESENT  Lymphadenopathy:   []    None Noted            []    PRESENT    Chest:  []    Clear               []     Rhonchi                      Dec'd @     []   Right Base           []    Left Base    CV:             []    Regular              []   Irregular               []    Tachy                []    Murmur  Abdominal:   [x]     Soft              []   NON-tender               []    Tender      BS:    [x]    ABSENT                   []    PRESENT  Liver:     [x]   NON-palp                  []    EDGE- palp  Spleen: [x]    NON-palp                   []   EDGE - palp  Mass:   [x]    ABSENT                          []   PRESENT  Extr:    []   Lymphedema             []    Cyanosis      []   Clubbing  Edema:     []    NONE       []    PRESENT  Skin:  Intact [x]            Purpura []         Rash: [x]    ABSENT       []   PRESENT  Neuro:  [x]         Normal  []         Confused      Available labs reviewed:  Labs:    Recent Results (from the past 24 hour(s))   RENAL FUNCTION PANEL    Collection Time    05/25/12  9:41 AM       Component Value Range    Sodium 134 (*) 136 - 145 MMOL/L    Potassium 4.6  3.5 - 5.1 MMOL/L    Chloride 93 (*) 97 - 108 MMOL/L    CO2 30  21 - 32 MMOL/L    Anion gap 11  5 - 15 mmol/L    Glucose 121 (*) 65 - 100 MG/DL    BUN 42 (*) 6 - 20 MG/DL    Creatinine 3.32 (*) 0.45 - 1.15 MG/DL    BUN/Creatinine ratio 5 (*) 12 - 20      GFR est AA 10 (*)  >60 ml/min/1.51m2    GFR est non-AA 8 (*) >60 ml/min/1.20m2    Calcium 7.8 (*) 8.5 - 10.1 MG/DL    Phosphorus 5.9 (*) 2.5 - 4.9 MG/DL    Albumin 3.2 (*) 3.5 - 5.0 g/dL   MAGNESIUM    Collection Time    05/25/12  9:41 AM       Component Value Range    Magnesium 2.2  1.6 - 2.4 MG/DL   CBC WITH AUTOMATED DIFF    Collection Time    05/25/12  9:41 AM       Component Value Range    WBC 2.7 (*) 4.1 - 11.1 K/uL    RBC 2.40 (*) 4.10 - 5.70 M/uL    HGB 7.5 (*) 12.1 - 17.0 g/dL    HCT 95.1 (*) 88.4 - 50.3 %    MCV 97.9  80.0 - 99.0 FL    MCH 31.3  26.0 - 34.0 PG    MCHC 31.9  30.0 - 36.5 g/dL    RDW 16.6 (*) 06.3 - 14.5 %    PLATELET 155  150 -  400 K/uL    NEUTROPHILS 52  32 - 75 %    LYMPHOCYTES 31  12 - 49 %    MONOCYTES 12  5 - 13 %    EOSINOPHILS 5  0 - 7 %    BASOPHILS 0  0 - 1 %    ABS. NEUTROPHILS 1.4 (*) 1.8 - 8.0 K/UL    ABS. LYMPHOCYTES 0.8  0.8 - 3.5 K/UL    ABS. MONOCYTES 0.3  0.0 - 1.0 K/UL    ABS. EOSINOPHILS 0.1  0.0 - 0.4 K/UL    ABS. BASOPHILS 0.0  0.0 - 0.1 K/UL   GLUCOSE, POC    Collection Time    05/25/12 11:31 AM       Component Value Range    POC GLUCOSE 120 (*) 75 - 110 mg/dL    Performed by Rondell Reams     GLUCOSE, POC    Collection Time    05/25/12  5:20 PM       Component Value Range    POC GLUCOSE 121 (*) 75 - 110 mg/dL    Performed by Wyn Forster     GLUCOSE, POC    Collection Time    05/25/12  9:01 PM       Component Value Range    POC GLUCOSE 93  75 - 110 mg/dL    Performed by Marella Bile JASON     CBC W/O DIFF    Collection Time    05/26/12  3:00 AM       Component Value Range    WBC 2.4 (*) 4.1 - 11.1 K/uL    RBC 2.74 (*) 4.10 - 5.70 M/uL    HGB 8.5 (*) 12.1 - 17.0 g/dL    HCT 16.1 (*) 09.6 - 50.3 %    MCV 99.6 (*) 80.0 - 99.0 FL    MCH 31.0  26.0 - 34.0 PG    MCHC 31.1  30.0 - 36.5 g/dL    RDW 04.5  40.9 - 81.1 %    PLATELET 157  150 - 400 K/uL   GLUCOSE, POC    Collection Time    05/26/12  6:39 AM       Component Value Range    POC GLUCOSE 88  75 - 110 mg/dL    Performed by Egbert Garibaldi         Available  Xrays reviewed:    Chemotherapy monitored and toxicities assessed:    Assessment and Plan   1. Sickle cell pain crisis... Probably precipitated by pneumonia... Will continue current pca settings.  Pt. Says his crises usually last 3-4 days, is followed at [facility] in the fellow[facility] clinic  2. Anemia.. Secondary to #1 +/_ esrd... Will transfuse prn hgb <7.5  3. Pneumonia... Cultures negative so far, will continue antibiotics  Harlen Labs, MD

## 2012-05-27 LAB — CBC W/O DIFF
HCT: 26.8 % — ABNORMAL LOW (ref 36.6–50.3)
HGB: 8.3 g/dL — ABNORMAL LOW (ref 12.1–17.0)
MCH: 30.7 PG (ref 26.0–34.0)
MCHC: 31 g/dL (ref 30.0–36.5)
MCV: 99.3 FL — ABNORMAL HIGH (ref 80.0–99.0)
PLATELET: 165 10*3/uL (ref 150–400)
RBC: 2.7 M/uL — ABNORMAL LOW (ref 4.10–5.70)
RDW: 14.4 % (ref 11.5–14.5)
WBC: 3 10*3/uL — ABNORMAL LOW (ref 4.1–11.1)

## 2012-05-27 LAB — GLUCOSE, POC
Glucose (POC): 104 mg/dL (ref 75–110)
Glucose (POC): 155 mg/dL — ABNORMAL HIGH (ref 75–110)
Glucose (POC): 47 mg/dL — CL (ref 75–110)
Glucose (POC): 51 mg/dL — ABNORMAL LOW (ref 75–110)
Glucose (POC): 59 mg/dL — ABNORMAL LOW (ref 75–110)
Glucose (POC): 75 mg/dL (ref 75–110)
Glucose (POC): 75 mg/dL (ref 75–110)
Glucose (POC): 116 mg/dL — ABNORMAL HIGH (ref 75–110)
Glucose (POC): 91 mg/dL (ref 75–110)

## 2012-05-27 MED ADMIN — 0.9% sodium chloride infusion: INTRAVENOUS | @ 12:00:00 | NDC 00409798309

## 2012-05-27 MED ADMIN — sodium chloride (NS) flush 5-10 mL: INTRAVENOUS | @ 11:00:00 | NDC 87701099893

## 2012-05-27 MED ADMIN — HYDROmorphone (PF) 15 mg/30 ml (DILAUDID) PCA: INTRAVENOUS | NDC 02420030164

## 2012-05-27 MED ADMIN — diphenhydrAMINE (BENADRYL) injection 12.5 mg: INTRAVENOUS | @ 02:00:00 | NDC 00641037621

## 2012-05-27 MED ADMIN — insulin glargine (LANTUS) injection 8 Units: SUBCUTANEOUS | @ 12:00:00 | NDC 09999222001

## 2012-05-27 MED ADMIN — HYDROmorphone (PF) (DILAUDID) injection 2 mg: INTRAVENOUS | @ 17:00:00 | NDC 00409255201

## 2012-05-27 MED ADMIN — calcium acetate (PHOSLO) tablet 1,334 mg: ORAL | @ 12:00:00 | NDC 00574011302

## 2012-05-27 MED ADMIN — diphenhydrAMINE (BENADRYL) injection 12.5 mg: INTRAVENOUS | @ 14:00:00 | NDC 63323066401

## 2012-05-27 MED ADMIN — HYDROmorphone (PF) 15 mg/30 ml (DILAUDID) PCA: INTRAVENOUS | @ 23:00:00 | NDC 02420030164

## 2012-05-27 MED ADMIN — HYDROmorphone (PF) 15 mg/30 ml (DILAUDID) PCA: INTRAVENOUS | @ 02:00:00 | NDC 02420030164

## 2012-05-27 MED ADMIN — calcium acetate (PHOSLO) tablet 1,334 mg: ORAL | @ 17:00:00 | NDC 00574011302

## 2012-05-27 MED ADMIN — diphenhydrAMINE (BENADRYL) injection 12.5 mg: INTRAVENOUS | @ 07:00:00 | NDC 00641037621

## 2012-05-27 MED ADMIN — HYDROmorphone (PF) (DILAUDID) injection 2 mg: INTRAVENOUS | @ 14:00:00 | NDC 00409255201

## 2012-05-27 MED ADMIN — sodium chloride (NS) flush 5-10 mL: INTRAVENOUS | @ 23:00:00 | NDC 87701099893

## 2012-05-27 MED ADMIN — heparin (porcine) injection 5,000 Units: SUBCUTANEOUS | @ 10:00:00 | NDC 25021040201

## 2012-05-27 MED ADMIN — heparin (porcine) injection 5,000 Units: SUBCUTANEOUS | @ 18:00:00 | NDC 25021040201

## 2012-05-27 MED ADMIN — HYDROmorphone (PF) (DILAUDID) injection 2 mg: INTRAVENOUS | @ 20:00:00 | NDC 00409255201

## 2012-05-27 MED ADMIN — HYDROmorphone (PF) (DILAUDID) injection 2 mg: INTRAVENOUS | @ 23:00:00 | NDC 00409255201

## 2012-05-27 MED ADMIN — calcium acetate (PHOSLO) tablet 1,334 mg: ORAL | @ 21:00:00 | NDC 00574011302

## 2012-05-27 MED ADMIN — sodium chloride (NS) flush 5-10 mL: INTRAVENOUS | @ 17:00:00 | NDC 87701099893

## 2012-05-27 MED ADMIN — HYDROmorphone (PF) (DILAUDID) injection 1 mg: INTRAVENOUS | @ 02:00:00 | NDC 00409255201

## 2012-05-27 MED ADMIN — HYDROmorphone (PF) 15 mg/30 ml (DILAUDID) PCA: INTRAVENOUS | @ 12:00:00 | NDC 02420030164

## 2012-05-27 MED ADMIN — HYDROmorphone (PF) (DILAUDID) injection 1 mg: INTRAVENOUS | @ 07:00:00 | NDC 00409255201

## 2012-05-27 MED ADMIN — HYDROmorphone (PF) (DILAUDID) injection 1 mg: INTRAVENOUS | @ 11:00:00 | NDC 00409255201

## 2012-05-27 MED ADMIN — amLODIPine (NORVASC) tablet 5 mg: ORAL | @ 12:00:00 | NDC 59762153006

## 2012-05-27 MED ADMIN — HYDROmorphone (PF) (DILAUDID) injection 1 mg: INTRAVENOUS | @ 04:00:00 | NDC 00409255201

## 2012-05-27 MED ADMIN — lisinopril (PRINIVIL, ZESTRIL) tablet 10 mg: ORAL | @ 12:00:00 | NDC 68084006111

## 2012-05-27 MED ADMIN — ondansetron (ZOFRAN) injection 4 mg: INTRAVENOUS | @ 14:00:00 | NDC 00409475503

## 2012-05-27 MED ADMIN — diphenhydrAMINE (BENADRYL) injection 12.5 mg: INTRAVENOUS | @ 20:00:00 | NDC 63323066401

## 2012-05-27 MED ADMIN — insulin lispro (HUMALOG) injection: SUBCUTANEOUS | @ 12:00:00 | NDC 99999192301

## 2012-05-27 MED ADMIN — sodium chloride (NS) flush 5-10 mL: INTRAVENOUS | @ 02:00:00 | NDC 82903065462

## 2012-05-27 MED ADMIN — sodium chloride (NS) flush 5-10 mL: INTRAVENOUS | @ 14:00:00 | NDC 87701099893

## 2012-05-27 MED ADMIN — heparin (porcine) injection 5,000 Units: SUBCUTANEOUS | @ 02:00:00 | NDC 25021040201

## 2012-05-27 MED ADMIN — ondansetron (ZOFRAN) injection 4 mg: INTRAVENOUS | @ 20:00:00 | NDC 00409475503

## 2012-05-27 MED ADMIN — sodium chloride (NS) flush 5-10 mL: INTRAVENOUS | @ 18:00:00 | NDC 87701099893

## 2012-05-27 MED ADMIN — 0.9% sodium chloride infusion: INTRAVENOUS | @ 02:00:00 | NDC 00409798309

## 2012-05-27 MED FILL — BD POSIFLUSH NORMAL SALINE 0.9 % INJECTION SYRINGE: INTRAMUSCULAR | Qty: 10

## 2012-05-27 MED FILL — ONDANSETRON (PF) 4 MG/2 ML INJECTION: 4 mg/2 mL | INTRAMUSCULAR | Qty: 2

## 2012-05-27 MED FILL — HEPARIN (PORCINE) 5,000 UNIT/ML IJ SOLN: 5000 unit/mL | INTRAMUSCULAR | Qty: 1

## 2012-05-27 MED FILL — BD POSIFLUSH NORMAL SALINE 0.9 % INJECTION SYRINGE: INTRAMUSCULAR | Qty: 20

## 2012-05-27 MED FILL — DIPHENHYDRAMINE HCL 50 MG/ML IJ SOLN: 50 mg/mL | INTRAMUSCULAR | Qty: 1

## 2012-05-27 MED FILL — NICOTINE 14 MG/24 HR DAILY PATCH: 14 mg/24 hr | TRANSDERMAL | Qty: 1

## 2012-05-27 MED FILL — HYDROMORPHONE (PF) 1 MG/ML IJ SOLN: 1 mg/mL | INTRAMUSCULAR | Qty: 1

## 2012-05-27 MED FILL — HYDROMORPHONE 15 MG/30 ML (0.5 MG/ML) IN 0.9% SOD.CHLORIDE INFUSION: 15 mg/30 mL (0.5 mg/mL) | INTRAVENOUS | Qty: 30

## 2012-05-27 MED FILL — LISINOPRIL 10 MG TAB: 10 mg | ORAL | Qty: 1

## 2012-05-27 MED FILL — HYDROMORPHONE (PF) 1 MG/ML IJ SOLN: 1 mg/mL | INTRAMUSCULAR | Qty: 2

## 2012-05-27 MED FILL — AMLODIPINE 5 MG TAB: 5 mg | ORAL | Qty: 1

## 2012-05-27 MED FILL — NEPHROCAPS 1 MG CAPSULE: 1 mg | ORAL | Qty: 1

## 2012-05-27 MED FILL — CALCIUM ACETATE 667 MG TAB: 667 mg | ORAL | Qty: 2

## 2012-05-27 MED FILL — INSULIN LISPRO 100 UNIT/ML INJECTION: 100 unit/mL | SUBCUTANEOUS | Qty: 1

## 2012-05-27 MED FILL — INSULIN GLARGINE 100 UNIT/ML INJECTION: 100 unit/mL | SUBCUTANEOUS | Qty: 0.08

## 2012-05-27 NOTE — Progress Notes (Signed)
Hospitalist Progress Note          Mortimer Fries, MD  Pager # [phone]  Call physician on-call through the operator 7pm-7am    Daily Progress Note: 05/27/2012    PCP- Phys Other, MD  Assessment/Plan:   1. LLB pneumonia, poa- On zosyn and Levaquin. Zosyn stopped per ID, cont levaquin, Follow Cx , no growth thus far, to complete levaquin course per ID  2. Sickle cell crisis, poa - Pt on dilaudid PCA, encourge po hydration, 02 as needed . Continue Hydrea. PCA changed to PRN only  Yesterday with hope to d/c, pt with more pain, and now increased dose PCA. To follow closely and adjust as appropriate, added zofran for nausea,   3. ESRD on HD - HD as per nephrology   4. Anemia - Hb 7.5-7.9 , hematology following , monitor, stable  5. Hyponatremia - monitor, stable    6. HTN- BP consistently higher, was on regular diet, changed to low sod diet, to f/u BP  7. DM- resume lantus, ssi   8. Tobaco abuse - nicotine, counseled   9. Chronic pain - pain management as per hematology   10. DVT prophylaxis- heparin  11. Plan- Home on po abx soon once pain controlled       Subjective:   Reports doing better, but still with pain and dyspnea, using oxygen at rest, asking for more freedom  With pca  BP elevated, on regular diet,  nauseated    Review of Systems:   A comprehensive review of systems was negative except for that written in the HPI.    Objective:   Physical Exam:     BP 156/80   Pulse 84   Temp 96.9 ??F (36.1 ??C)   Resp 18   Ht 5\' 5"  (1.651 m)   Wt 196 lb 6.9 oz   BMI 32.69 kg/m2   SpO2 99% O2 Flow Rate (L/min): 2 l/min O2 Device: Room air    Temp (24hrs), Avg:97 ??F (36.1 ??C), Min:95.9 ??F (35.5 ??C), Max:97.6 ??F (36.4 ??C)    06/09 0700 - 06/09 1859  In: 5058.8 [P.O.:200; I.V.:4858.8]  Out: -    06/07 1900 - 06/09 0659  In: 200 [P.O.:200]  Out: -     General: Well nourished, calm, appropriate, NAD  ambulatory  HEENT: PERRLA bilaterally, neck supple, no JVD, no thyromegaly, no lymphadenopathy   CVS: RRR, no murmurs, rubs or gallops    Lungs: diminished left base otherwise clear to auscultation, no wheezing, rales or rhonchi   GI: soft nontender, NABS. No rebound or guarding   EXT: 2+ pulses bilaterally, trace edema, fistula right arm   Neuro: Cranial nerves intact, strength 5/5 in all 4 extremities, no gross focal neuro deficits, AAO X3   Skin: intact   Psych: normal affect    Data Review:       Recent Days:  Recent Labs   Basename 05/27/12 0343 05/26/12 0300 05/25/12 0941    WBC 3.0* 2.4* 2.7*    HGB 8.3* 8.5* 7.5*    HCT 26.8* 27.3* 23.5*    PLT 165 157 155     Recent Labs   Basename 05/25/12 0941    NA 134*    K 4.6    CL 93*    CO2 30    GLU 121*    BUN 42*    CREA 8.06*    CA 7.8*    MG 2.2    PHOS 5.9*    ALB  3.2*    TBIL --    SGOT --    INR --     No results found for this basename: PH:3,PCO2:3,PO2:3,HCO3:3,FIO2:3 in the last 72 hours    24 Hour Results:  Recent Results (from the past 24 hour(s))   GLUCOSE, POC    Collection Time    05/26/12  4:52 PM       Component Value Range    POC GLUCOSE 94  75 - 110 mg/dL    Performed by Rondell Reams     GLUCOSE, POC    Collection Time    05/26/12  9:21 PM       Component Value Range    POC GLUCOSE 104  75 - 110 mg/dL    Performed by Drexel Iha Erin     CBC W/O DIFF    Collection Time    06-14-12  3:43 AM       Component Value Range    WBC 3.0 (*) 4.1 - 11.1 K/uL    RBC 2.70 (*) 4.10 - 5.70 M/uL    HGB 8.3 (*) 12.1 - 17.0 g/dL    HCT 16.1 (*) 09.6 - 50.3 %    MCV 99.3 (*) 80.0 - 99.0 FL    MCH 30.7  26.0 - 34.0 PG    MCHC 31.0  30.0 - 36.5 g/dL    RDW 04.5  40.9 - 81.1 %    PLATELET 165  150 - 400 K/uL   GLUCOSE, POC    Collection Time    14-Jun-2012  6:14 AM       Component Value Range    POC GLUCOSE 155 (*) 75 - 110 mg/dL    Performed by Ollen Bowl     GLUCOSE, POC    Collection Time    06-14-12 11:29 AM       Component Value Range    POC GLUCOSE 47 (*) 75 - 110 mg/dL    Performed by Johny Shock     GLUCOSE, POC    Collection Time    06/14/12 11:33 AM       Component Value Range    POC GLUCOSE 51  (*) 75 - 110 mg/dL    Performed by Johny Shock     GLUCOSE, POC    Collection Time    14-Jun-2012 11:50 AM       Component Value Range    POC GLUCOSE 59 (*) 75 - 110 mg/dL    Performed by Johny Shock     GLUCOSE, POC    Collection Time    06-14-12 12:09 PM       Component Value Range    POC GLUCOSE 75  75 - 110 mg/dL    Performed by Johny Shock     GLUCOSE, POC    Collection Time    Jun 14, 2012 12:25 PM       Component Value Range    POC GLUCOSE 75  75 - 110 mg/dL    Performed by Johny Shock         Problem List:  Problem List as of June 14, 2012  Never Reviewed      Codes Class Noted - Resolved    *Sickle cell crisis 282.62  05/24/2012 - Present              Medications reviewed  Current Facility-Administered Medications   Medication Dose Route Frequency   ??? HYDROmorphone (PF) (DILAUDID) injection 2 mg  2 mg IntraVENous Q3H PRN   ???  ondansetron (ZOFRAN) injection 4 mg  4 mg IntraVENous Q6H PRN   ??? HYDROmorphone (PF) 15 mg/30 ml (DILAUDID) PCA   IntraVENous CONTINUOUS   ??? levofloxacin (LEVAQUIN) tablet 500 mg  500 mg Oral Q48H   ??? hydroxyurea (HYDREA) chemo cap 500 mg  500 mg Oral Q MON, WED & FRI   ??? amLODIPine (NORVASC) tablet 5 mg  5 mg Oral DAILY   ??? calcium acetate (PHOSLO) tablet 1,334 mg  2 Tab Oral TID WITH MEALS   ??? lisinopril (PRINIVIL, ZESTRIL) tablet 10 mg  10 mg Oral DAILY   ??? sodium chloride (NS) flush 5-10 mL  5-10 mL IntraVENous Q8H   ??? sodium chloride (NS) flush 5-10 mL  5-10 mL IntraVENous PRN   ??? heparin (porcine) injection 5,000 Units  5,000 Units SubCUTAneous Q8H   ??? 0.9% sodium chloride infusion  75 mL/hr IntraVENous CONTINUOUS   ??? B complex-vitaminC-folic acid (NEPHROCAP) cap  1 Cap Oral DAILY   ??? diphenhydrAMINE (BENADRYL) injection 12.5 mg  12.5 mg IntraVENous Q6H PRN   ??? nicotine (NICODERM CQ) 14 mg/24 hr patch 1 Patch  1 Patch TransDERmal Q24H   ??? insulin glargine (LANTUS) injection 8 Units  8 Units SubCUTAneous DAILY   ??? insulin lispro (HUMALOG) injection   SubCUTAneous AC&HS   ??? glucose  chewable tablet 16 g  4 Tab Oral PRN   ??? dextrose (D50W) injection Syrg 12.5-25 g  12.5-25 g IntraVENous PRN   ??? glucagon (GLUCAGEN) injection 1 mg  1 mg IntraMUSCular PRN       Care Plan discussed with: Patient/Family and Nurse    Total time spent with patient: 30 minutes.    Mortimer Fries, MD

## 2012-05-27 NOTE — Progress Notes (Signed)
Hematology-Oncology Progress Note    Tyler Ballard  09-28-1981  [id number]  05/27/2012    Follow-up for: sickle cell anemia     [x]         Chart notes since last visit reviewed   [x]         Medications reviewed for allergies and interactions       Case discussed with the following:         []         Case Manager                    [x]         Nursing Staff                                                                         []         Pathologist                                                                        []         FAMILY      Subjective:     Spoke with patient who complains of: pt.  Had more back pain last night, is requesting increase in bolus prn meds    Objective:     Patient Vitals for the past 24 hrs:   BP Temp Pulse Resp SpO2 Weight   05/27/12 0749 177/87 mmHg 97 ??F (36.1 ??C) 76  18  100 % -   05/27/12 0304 160/81 mmHg 96.9 ??F (36.1 ??C) 89  20  99 % 196 lb 6.9 oz   05/26/12 2345 155/87 mmHg 95.9 ??F (35.5 ??C) 97  20  95 % -   05/26/12 2055 164/84 mmHg 97.6 ??F (36.4 ??C) 88  20  100 % -   05/26/12 2000 - - - - 92 % -   05/26/12 1537 160/85 mmHg 96.7 ??F (35.9 ??C) 89  18  100 % -   05/26/12 1207 178/88 mmHg 96 ??F (35.6 ??C) 87  18  98 % -       REVIEW OF SYSTEMS:    Constitutional: negative fever, negative chills, negative weight loss  Eyes:   negative visual changes  ENT:   negative sore throat, tongue or lip swelling  Respiratory:  negative cough, negative dyspnea  Cards:  negative for chest pain, palpitations, lower extremity edema  GI:   negative for nausea, vomiting, diarrhea, and abdominal pain  Neuro:  negative for headaches, dizziness, vertigo  [x]                         Full ROS o/w normal/non contributor    Constitutional:  Patient looks  []         Sick  []         Frail  [x]         Better                                                 []   Depressed    HEENT:  [x]    NC                         []    AT               []     ALOPECIA           Eyes: [x]    Normal               []     Icteric   Oropharynx: [x]     Normal                  []   Thrush               []    Dry  Mucositis: [x]     None                 Grade: []         I  []         II  []         III  []         IV  Neck:   [x]    Supple                  []   Rigid               JVD:    [x]    ABSENT       []    PRESENT  Lymphadenopathy:   []    None Noted            []    PRESENT    Chest:  []    Clear               []     Rhonchi                      Dec'd @     []   Right Base           []    Left Base    CV:             []    Regular              []   Irregular               []    Tachy                []    Murmur  Abdominal:   [x]     Soft              []    NON-tender               []    Tender      BS:    [x]    ABSENT                   []    PRESENT  Liver:     [x]   NON-palp                  []    EDGE- palp  Spleen: [x]    NON-palp                   []   EDGE - palp  Mass:   [x]    ABSENT                          []   PRESENT  Extr:    []   Lymphedema             []    Cyanosis      []   Clubbing  Edema:     []    NONE       []    PRESENT  Skin:  Intact [x]            Purpura []         Rash: [x]    ABSENT       []   PRESENT  Neuro:  [x]         Normal  []         Confused      Available labs reviewed:  Labs:    Recent Results (from the past 24 hour(s))   GLUCOSE, POC    Collection Time    05/26/12 12:06 PM       Component Value Range    POC GLUCOSE 106  75 - 110 mg/dL    Performed by Rondell Reams     GLUCOSE, POC    Collection Time    05/26/12  4:52 PM       Component Value Range    POC GLUCOSE 94  75 - 110 mg/dL    Performed by Rondell Reams     GLUCOSE, POC    Collection Time    05/26/12  9:21 PM       Component Value Range    POC GLUCOSE 104  75 - 110 mg/dL    Performed by Jackqulyn Livings     CBC W/O DIFF    Collection Time    05/27/12  3:43 AM       Component Value Range    WBC 3.0 (*) 4.1 - 11.1 K/uL    RBC 2.70 (*) 4.10 - 5.70 M/uL    HGB 8.3 (*) 12.1 - 17.0 g/dL    HCT 96.2 (*) 95.2 - 50.3 %    MCV 99.3 (*) 80.0 - 99.0 FL    MCH 30.7  26.0 - 34.0 PG    MCHC 31.0  30.0 - 36.5  g/dL    RDW 84.1  32.4 - 40.1 %    PLATELET 165  150 - 400 K/uL   GLUCOSE, POC    Collection Time    05/27/12  6:14 AM       Component Value Range    POC GLUCOSE 155 (*) 75 - 110 mg/dL    Performed by Ollen Bowl         Available Xrays reviewed:    Chemotherapy monitored and toxicities assessed:    Assessment and Plan   1. Sickle cell pain crisis... Probably precipitated by pneumonia.Marland KitchenMarland KitchenWill adjust prn meds.. Pt. Says his crises usually last 3-4 days, is followed at [facility] in the fellow's clinic  2. Anemia.. Secondary to #1 +/_ esrd... Will transfuse prn hgb <7.5  3. Pneumonia... Cultures negative so far, will continue antibiotics  Harlen Labs, MD

## 2012-05-27 NOTE — Progress Notes (Addendum)
Problem: Falls - Risk of  Goal: *Absence of falls  Outcome: Progressing Towards Goal  Call bell within reach, wearing non-stop foot wear, bedside table within reach, bed in low position  Goal: *Knowledge of fall prevention  Outcome: Progressing Towards Goal  Pt. States will call if assistance is required  Pt. Ambulating in room with iv pole, demonstrating appropriate safety interventions    Problem: Pain  Goal: *Control of Pain  Outcome: Progressing Towards Goal  Patient on PCA and requests PRN medication for break through pain.  Pain states pain has improved and resting comfortablly.      Bedside and Verbal shift change report given to Ethelene Browns (Cabin crew) by Delma Post, RN (offgoing nurse).  Report given with SBAR, Kardex, MAR and Recent Results.

## 2012-05-27 NOTE — Progress Notes (Addendum)
1135- Noted patients blood pressure is high and his blood sugar is low. He is eating ice cream and drinking regular ginger ale. The patient does not feel any effects from the low level. Will continue to monitor BG Q 15 minutes until back to above 80. Dr. Candice Camp paged now to notify.  1140- MD notified now and he will adjust medications  1230- Patient continues to be asymptomatic and is still taking in more food to raise blood sugar. Will continue to check glucose.

## 2012-05-28 LAB — GLUCOSE, POC
Glucose (POC): 128 mg/dL — ABNORMAL HIGH (ref 75–110)
Glucose (POC): 164 mg/dL — ABNORMAL HIGH (ref 75–110)
Glucose (POC): 57 mg/dL — ABNORMAL LOW (ref 75–110)
Glucose (POC): 73 mg/dL — ABNORMAL LOW (ref 75–110)
Glucose (POC): 80 mg/dL (ref 75–110)
Glucose (POC): 98 mg/dL (ref 75–110)

## 2012-05-28 MED ADMIN — HYDROmorphone (PF) (DILAUDID) injection 2 mg: INTRAVENOUS | @ 22:00:00 | NDC 00409255201

## 2012-05-28 MED ADMIN — HYDROmorphone (PF) (DILAUDID) injection 2 mg: INTRAVENOUS | @ 05:00:00 | NDC 00409255201

## 2012-05-28 MED ADMIN — HYDROmorphone (PF) (DILAUDID) injection 2 mg: INTRAVENOUS | @ 18:00:00 | NDC 00409255201

## 2012-05-28 MED ADMIN — lisinopril (PRINIVIL, ZESTRIL) tablet 10 mg: ORAL | @ 14:00:00 | NDC 68084006111

## 2012-05-28 MED ADMIN — levofloxacin (LEVAQUIN) tablet 500 mg: ORAL | @ 14:00:00 | NDC 68084048211

## 2012-05-28 MED ADMIN — sodium chloride (NS) flush 5-10 mL: INTRAVENOUS | @ 10:00:00 | NDC 82903065462

## 2012-05-28 MED ADMIN — HYDROmorphone (PF) (DILAUDID) injection 2 mg: INTRAVENOUS | @ 11:00:00 | NDC 00409255201

## 2012-05-28 MED ADMIN — diphenhydrAMINE (BENADRYL) injection 12.5 mg: INTRAVENOUS | @ 14:00:00 | NDC 63323066401

## 2012-05-28 MED ADMIN — sodium chloride (NS) flush 5-10 mL: INTRAVENOUS | @ 18:00:00 | NDC 82903065462

## 2012-05-28 MED ADMIN — insulin lispro (HUMALOG) injection: SUBCUTANEOUS | @ 11:00:00 | NDC 99999192301

## 2012-05-28 MED ADMIN — HYDROmorphone (PF) (DILAUDID) injection 2 mg: INTRAVENOUS | @ 14:00:00 | NDC 00409255201

## 2012-05-28 MED ADMIN — 0.9% sodium chloride infusion: INTRAVENOUS | NDC 00409798309

## 2012-05-28 MED ADMIN — HYDROmorphone (PF) (DILAUDID) injection 2 mg: INTRAVENOUS | @ 08:00:00 | NDC 00409255201

## 2012-05-28 MED ADMIN — amLODIPine (NORVASC) tablet 5 mg: ORAL | @ 23:00:00 | NDC 59762153002

## 2012-05-28 MED ADMIN — heparin (porcine) injection 5,000 Units: SUBCUTANEOUS | @ 02:00:00 | NDC 25021040201

## 2012-05-28 MED ADMIN — heparin (porcine) injection 5,000 Units: SUBCUTANEOUS | @ 18:00:00 | NDC 63323026201

## 2012-05-28 MED ADMIN — diphenhydrAMINE (BENADRYL) injection 12.5 mg: INTRAVENOUS | @ 02:00:00 | NDC 63323066401

## 2012-05-28 MED ADMIN — insulin glargine (LANTUS) injection 8 Units: SUBCUTANEOUS | @ 15:00:00 | NDC 09999222001

## 2012-05-28 MED ADMIN — HYDROmorphone (PF) (DILAUDID) injection 2 mg: INTRAVENOUS | @ 02:00:00 | NDC 00409255201

## 2012-05-28 MED ADMIN — calcium acetate (PHOSLO) tablet 1,334 mg: ORAL | @ 14:00:00 | NDC 00574011302

## 2012-05-28 MED ADMIN — diphenhydrAMINE (BENADRYL) injection 12.5 mg: INTRAVENOUS | @ 08:00:00 | NDC 63323066401

## 2012-05-28 MED ADMIN — ondansetron (ZOFRAN) injection 4 mg: INTRAVENOUS | @ 08:00:00 | NDC 00409475503

## 2012-05-28 MED ADMIN — ondansetron (ZOFRAN) injection 4 mg: INTRAVENOUS | @ 15:00:00 | NDC 00409475503

## 2012-05-28 MED ADMIN — ondansetron (ZOFRAN) injection 4 mg: INTRAVENOUS | @ 02:00:00 | NDC 00409475503

## 2012-05-28 MED ADMIN — heparin (porcine) injection 5,000 Units: SUBCUTANEOUS | @ 11:00:00 | NDC 25021040201

## 2012-05-28 MED ADMIN — sodium chloride (NS) flush 5-10 mL: INTRAVENOUS | @ 05:00:00 | NDC 87701099893

## 2012-05-28 MED FILL — LISINOPRIL 10 MG TAB: 10 mg | ORAL | Qty: 1

## 2012-05-28 MED FILL — BD POSIFLUSH NORMAL SALINE 0.9 % INJECTION SYRINGE: INTRAMUSCULAR | Qty: 10

## 2012-05-28 MED FILL — HYDROMORPHONE (PF) 1 MG/ML IJ SOLN: 1 mg/mL | INTRAMUSCULAR | Qty: 2

## 2012-05-28 MED FILL — LEVOFLOXACIN 500 MG TAB: 500 mg | ORAL | Qty: 1

## 2012-05-28 MED FILL — DIPHENHYDRAMINE HCL 50 MG/ML IJ SOLN: 50 mg/mL | INTRAMUSCULAR | Qty: 1

## 2012-05-28 MED FILL — ONDANSETRON (PF) 4 MG/2 ML INJECTION: 4 mg/2 mL | INTRAMUSCULAR | Qty: 2

## 2012-05-28 MED FILL — CALCIUM ACETATE 667 MG TAB: 667 mg | ORAL | Qty: 2

## 2012-05-28 MED FILL — HYDROXYUREA 500 MG CAPSULE: 500 mg | ORAL | Qty: 1

## 2012-05-28 MED FILL — HEPARIN (PORCINE) 5,000 UNIT/ML IJ SOLN: 5000 unit/mL | INTRAMUSCULAR | Qty: 1

## 2012-05-28 MED FILL — NEPHROCAPS 1 MG CAPSULE: 1 mg | ORAL | Qty: 1

## 2012-05-28 MED FILL — NICOTINE 14 MG/24 HR DAILY PATCH: 14 mg/24 hr | TRANSDERMAL | Qty: 1

## 2012-05-28 MED FILL — INSULIN LISPRO 100 UNIT/ML INJECTION: 100 unit/mL | SUBCUTANEOUS | Qty: 1

## 2012-05-28 MED FILL — INSULIN GLARGINE 100 UNIT/ML INJECTION: 100 unit/mL | SUBCUTANEOUS | Qty: 0.08

## 2012-05-28 MED FILL — AMLODIPINE 5 MG TAB: 5 mg | ORAL | Qty: 1

## 2012-05-28 NOTE — Progress Notes (Addendum)
Followup for ESRD.  Up in chair. Some wheeze on exam. Access open. No edema.  No new c/o. Agrees that he is breathing well and that pain control has improved. Dialysis went "perfect" Friday.    No changes for now. Dialysis support.    LABS:   Recent Results (from the past 24 hour(s))   GLUCOSE, POC    Collection Time    05/27/12 11:29 AM       Component Value Range    POC GLUCOSE 47 (*) 75 - 110 mg/dL    Performed by Johny Shock     GLUCOSE, POC    Collection Time    05/27/12 11:33 AM       Component Value Range    POC GLUCOSE 51 (*) 75 - 110 mg/dL    Performed by Johny Shock     GLUCOSE, POC    Collection Time    05/27/12 11:50 AM       Component Value Range    POC GLUCOSE 59 (*) 75 - 110 mg/dL    Performed by Johny Shock     GLUCOSE, POC    Collection Time    05/27/12 12:09 PM       Component Value Range    POC GLUCOSE 75  75 - 110 mg/dL    Performed by Johny Shock     GLUCOSE, POC    Collection Time    05/27/12 12:25 PM       Component Value Range    POC GLUCOSE 75  75 - 110 mg/dL    Performed by Johny Shock     GLUCOSE, POC    Collection Time    05/27/12  1:19 PM       Component Value Range    POC GLUCOSE 116 (*) 75 - 110 mg/dL    Performed by Thersa Salt  ANTHONY     GLUCOSE, POC    Collection Time    05/27/12  4:38 PM       Component Value Range    POC GLUCOSE 91  75 - 110 mg/dL    Performed by Orlinda Blalock     GLUCOSE, POC    Collection Time    05/27/12  9:17 PM       Component Value Range    POC GLUCOSE 128 (*) 75 - 110 mg/dL    Performed by Orlinda Blalock     GLUCOSE, POC    Collection Time    05/28/12  6:10 AM       Component Value Range    POC GLUCOSE 164 (*) 75 - 110 mg/dL    Performed by Reed Pandy          Patient Vitals for the past 12 hrs:   Temp Pulse Resp BP SpO2   05/28/12 0752 96.6 ??F (35.9 ??C) 76  19  172/87 mmHg 96 %   05/28/12 0304 96.9 ??F (36.1 ??C) 102  12  186/95 mmHg 100 %   05/27/12 2302 96.4 ??F (35.8 ??C) 92  18  158/91 mmHg 97 %

## 2012-05-28 NOTE — Progress Notes (Signed)
Hematology-Oncology Progress Note    Tyler Ballard  [birth year]  [id number]  05/28/2012    Follow-up for: sickle cell anemia     [x]         Chart notes since last visit reviewed   [x]         Medications reviewed for allergies and interactions       Case discussed with the following:         []         Case Manager                    []         Nursing Staff                                                                         []         Pathologist                                                                        []         FAMILY      Subjective:     Spoke with patient who complains of: pt.  Still has some back and leg pain, main problem today is leg swelling and fluid retention    Objective:     Patient Vitals for the past 24 hrs:   BP Temp Pulse Resp SpO2 Weight   05/28/12 0752 172/87 mmHg 96.6 ??F (35.9 ??C) 76  19  96 % -   05/28/12 0403 162/56 mmHg - - - - -   05/28/12 0346 - - - - - 206 lb   05/28/12 0304 186/95 mmHg 96.9 ??F (36.1 ??C) 102  12  100 % -   05/27/12 2302 158/91 mmHg 96.4 ??F (35.8 ??C) 92  18  97 % -   05/27/12 1924 164/83 mmHg 97.2 ??F (36.2 ??C) 83  18  97 % -   05/27/12 1517 156/80 mmHg 96.9 ??F (36.1 ??C) 84  18  99 % -   05/27/12 1126 186/98 mmHg 97.4 ??F (36.3 ??C) 89  18  100 % -       REVIEW OF SYSTEMS:    Constitutional: negative fever, negative chills, negative weight loss  Eyes:   negative visual changes  ENT:   negative sore throat, tongue or lip swelling  Respiratory:  negative cough, negative dyspnea  Cards:  negative for chest pain, palpitations, lower extremity edema  GI:   negative for nausea, vomiting, diarrhea, and abdominal pain  Neuro:  negative for headaches, dizziness, vertigo  [x]                         Full ROS o/w normal/non contributor    Constitutional:  Patient looks  []         Sick  []         Frail  [x]         Better                                                 []   Depressed    HEENT:  [x]    NC                         []    AT               []     ALOPECIA            Eyes: [x]    Normal               []     Icteric  Oropharynx: [x]     Normal                  []   Thrush               []    Dry  Mucositis: [x]     None                 Grade: []         I  []         II  []         III  []         IV  Neck:   [x]    Supple                  []   Rigid               JVD:    [x]    ABSENT       []    PRESENT  Lymphadenopathy:   []    None Noted            []    PRESENT    Chest:  []    Clear               []     Rhonchi                      Dec'd @     []   Right Base           []    Left Base    CV:             []    Regular              []   Irregular               []    Tachy                []    Murmur  Abdominal:   [x]     Soft              []    NON-tender               []    Tender      BS:    [x]    ABSENT                   []    PRESENT  Liver:     [x]   NON-palp                  []    EDGE- palp  Spleen: [x]    NON-palp                   []   EDGE - palp  Mass:   [x]    ABSENT                          []   PRESENT  Extr:    []   Lymphedema             []    Cyanosis      []   Clubbing  Edema:     []    NONE       []    PRESENT  Skin:  Intact [x]            Purpura []         Rash: [x]    ABSENT       []   PRESENT  Neuro:  [x]         Normal  []         Confused      Available labs reviewed:  Labs:    Recent Results (from the past 24 hour(s))   GLUCOSE, POC    Collection Time    05/27/12 11:29 AM       Component Value Range    POC GLUCOSE 47 (*) 75 - 110 mg/dL    Performed by Johny Shock     GLUCOSE, POC    Collection Time    05/27/12 11:33 AM       Component Value Range    POC GLUCOSE 51 (*) 75 - 110 mg/dL    Performed by Johny Shock     GLUCOSE, POC    Collection Time    05/27/12 11:50 AM       Component Value Range    POC GLUCOSE 59 (*) 75 - 110 mg/dL    Performed by Johny Shock     GLUCOSE, POC    Collection Time    05/27/12 12:09 PM       Component Value Range    POC GLUCOSE 75  75 - 110 mg/dL    Performed by Johny Shock     GLUCOSE, POC    Collection Time    05/27/12 12:25 PM       Component Value  Range    POC GLUCOSE 75  75 - 110 mg/dL    Performed by Johny Shock     GLUCOSE, POC    Collection Time    05/27/12  1:19 PM       Component Value Range    POC GLUCOSE 116 (*) 75 - 110 mg/dL    Performed by Thersa Salt  ANTHONY     GLUCOSE, POC    Collection Time    05/27/12  4:38 PM       Component Value Range    POC GLUCOSE 91  75 - 110 mg/dL    Performed by Orlinda Blalock     GLUCOSE, POC    Collection Time    05/27/12  9:17 PM       Component Value Range    POC GLUCOSE 128 (*) 75 - 110 mg/dL    Performed by Orlinda Blalock     GLUCOSE, POC    Collection Time    05/28/12  6:10 AM       Component Value Range    POC GLUCOSE 164 (*) 75 - 110 mg/dL    Performed by Reed Pandy          Available Xrays reviewed:    Chemotherapy monitored and toxicities assessed:    Assessment and Plan   1. Sickle cell pain crisis... Probably precipitated by pneumonia.... Pt. Says his crises usually last 3-4 days, is followed at [facility] in the fellow's clinic  2. Anemia.. Secondary to #1 +/_ esrd... Will transfuse prn hgb <7.5  3. Pneumonia... Cultures negative so far, will continue antibiotics  4. ESRD... For dialysis today  Harlen Labs, MD

## 2012-05-28 NOTE — Progress Notes (Signed)
[facility] [place] [facility]                         [phone]  Vitals Pre Post Assessment Pre Post   BP 191/92 200/105 LOC A and o x 3 A and o x 3   HR 84 79 Lungs coarse coarse   Temp 98.5 98.1 Cardiac r r r r r r   Resp 18 18 Skin Warm/dry Warm/dry   Weight 94.6 89.6 Edema +1/+2 lower ext +1/+2 lower ext      Pain denies pca     Orders   Duration: 4 hours Start: 1400 End: 1800 Total: 4   Dialyzer: revaclear    K Bath:  3    Ca Bath:   2    Na / Bicarb: 140/35    Target Fluid Removal:5000      Access   Type & Location: r ua avf-cchlorhexadrine prep-15/15 canulation- bleeding time a=15 min   V=5 min   Comments:                                 Labs   Hep B status / date: Ab  61  01/24/12   Obtained/Reviewed  Critical Results Called   reveiwed       Meds Given   Name Dose Route                    Total Liters Process:104.4    Net Fluid Removed:5000       Comments   Time Out Done: Yes 1355  05/28/12   Primary Nurse Rpt Pre: Foy [place] morris rn   Primary Nurse Rpt Post: christa morris rn   Pt Education: Access/potassium   Tx Summary: Tolerated treatment well

## 2012-05-28 NOTE — Progress Notes (Signed)
Hospitalist Progress Note         Tyler Paschal, MD   Tyler Ballard [phone]  Call physician on-call through the operator 7pm-7am    Daily Progress Note: 05/28/2012    PCP- Phys Other, MD  Assessment/Plan:   1. LLB pneumonia, poa- On zosyn and Levaquin. Zosyn stopped per ID, cont levaquin, Follow Cx , no growth thus far, to complete levaquin course per ID  2. Sickle cell crisis, poa - Pt on dilaudid PCA, encourge po hydration, 02 as needed . Continue Hydrea. PCA changed to PRN only  Yesterday with hope to d/c, pt with more pain, and now increased dose PCA. To follow closely and adjust as appropriate, added zofran for nausea,   3. ESRD on HD - HD as per nephrology   4. Anemia - Hb 7.5-7.9 , hematology following , monitor, stable  5. Hyponatremia - monitor, stable    6. HTN- BP consistently higher, was on regular diet, changed to low sod diet, to f/u BP, pain contributing ? , if persistently elevated today will increase Norvasc to 10 ,   7. DM- resume lantus, ssi   8. Tobaco abuse - nicotine, counseled   9. Chronic pain - pain management as per hematology   10. DVT prophylaxis- heparin  11. Plan- Home on po abx soon once pain controlled       Subjective:     BP elevated,pain not well controlled yet       Review of Systems:   A comprehensive review of systems was negative except for that written in the HPI.    Objective:   Physical Exam:     BP 172/87   Pulse 76   Temp 96.6 ??F (35.9 ??C)   Resp 18   Ht 5\' 5"  (1.651 m)   Wt 206 lb   BMI 34.28 kg/m2   SpO2 96% O2 Flow Rate (L/min): 2 l/min O2 Device: Room air    Temp (24hrs), Avg:96.9 ??F (36.1 ??C), Min:96.4 ??F (35.8 ??C), Max:97.4 ??F (36.3 ??C)        06/08 1900 - 06/10 0659  In: 7051.3 [P.O.:1300; I.V.:5751.3]  Out: -     General: Well nourished, calm, appropriate, NAD  ambulatory  HEENT: PERRLA bilaterally, neck supple, no JVD, no thyromegaly, no lymphadenopathy   CVS: RRR, no murmurs, rubs or gallops   Lungs: diminished left base otherwise clear to auscultation,  no wheezing, rales or rhonchi   GI: soft nontender, NABS. No rebound or guarding   EXT: 2+ pulses bilaterally, trace edema, fistula right arm   Neuro: Cranial nerves intact, strength 5/5 in all 4 extremities, no gross focal neuro deficits, AAO X3   Skin: intact   Psych: normal affect    Data Review:       Recent Days:  Recent Labs   Basename 05/27/12 0343 05/26/12 0300    WBC 3.0* 2.4*    HGB 8.3* 8.5*    HCT 26.8* 27.3*    PLT 165 157     No results found for this basename: NA:3,K:3,CL:3,CO2:3,GLU:3,BUN:3,CREA:3,CA:3,MG:3,PHOS:3,ALB:3,TBIL:3,SGOT:3,ALT:3,INR:3 in the last 72 hours  No results found for this basename: PH:3,PCO2:3,PO2:3,HCO3:3,FIO2:3 in the last 72 hours    24 Hour Results:  Recent Results (from the past 24 hour(s))   GLUCOSE, POC    Collection Time    05/27/12 11:29 AM       Component Value Range    POC GLUCOSE 47 (*) 75 - 110 mg/dL    Performed by Johny Shock  GLUCOSE, POC    Collection Time    05/27/12 11:33 AM       Component Value Range    POC GLUCOSE 51 (*) 75 - 110 mg/dL    Performed by Johny Shock     GLUCOSE, POC    Collection Time    05/27/12 11:50 AM       Component Value Range    POC GLUCOSE 59 (*) 75 - 110 mg/dL    Performed by Johny Shock     GLUCOSE, POC    Collection Time    05/27/12 12:09 PM       Component Value Range    POC GLUCOSE 75  75 - 110 mg/dL    Performed by Johny Shock     GLUCOSE, POC    Collection Time    05/27/12 12:25 PM       Component Value Range    POC GLUCOSE 75  75 - 110 mg/dL    Performed by Johny Shock     GLUCOSE, POC    Collection Time    05/27/12  1:19 PM       Component Value Range    POC GLUCOSE 116 (*) 75 - 110 mg/dL    Performed by Thersa Salt  ANTHONY     GLUCOSE, POC    Collection Time    05/27/12  4:38 PM       Component Value Range    POC GLUCOSE 91  75 - 110 mg/dL    Performed by Orlinda Blalock     GLUCOSE, POC    Collection Time    05/27/12  9:17 PM       Component Value Range    POC GLUCOSE 128 (*) 75 - 110 mg/dL    Performed by Orlinda Blalock      GLUCOSE, POC    Collection Time    12-Jun-2012  6:10 AM       Component Value Range    POC GLUCOSE 164 (*) 75 - 110 mg/dL    Performed by Reed Pandy          Problem List:  Problem List as of 06/12/2012  Never Reviewed      Codes Class Noted - Resolved    *Sickle cell crisis 282.62  05/24/2012 - Present              Medications reviewed  Current Facility-Administered Medications   Medication Dose Route Frequency   ??? insulin lispro (HUMALOG) injection   SubCUTAneous TIDAC   ??? HYDROmorphone (PF) (DILAUDID) injection 2 mg  2 mg IntraVENous Q3H PRN   ??? ondansetron (ZOFRAN) injection 4 mg  4 mg IntraVENous Q6H PRN   ??? HYDROmorphone (PF) 15 mg/30 ml (DILAUDID) PCA   IntraVENous CONTINUOUS   ??? levofloxacin (LEVAQUIN) tablet 500 mg  500 mg Oral Q48H   ??? hydroxyurea (HYDREA) chemo cap 500 mg  500 mg Oral Q MON, WED & FRI   ??? amLODIPine (NORVASC) tablet 5 mg  5 mg Oral DAILY   ??? calcium acetate (PHOSLO) tablet 1,334 mg  2 Tab Oral TID WITH MEALS   ??? lisinopril (PRINIVIL, ZESTRIL) tablet 10 mg  10 mg Oral DAILY   ??? sodium chloride (NS) flush 5-10 mL  5-10 mL IntraVENous Q8H   ??? sodium chloride (NS) flush 5-10 mL  5-10 mL IntraVENous PRN   ??? heparin (porcine) injection 5,000 Units  5,000 Units SubCUTAneous Q8H   ??? 0.9% sodium chloride infusion  75 mL/hr IntraVENous  CONTINUOUS   ??? B complex-vitaminC-folic acid (NEPHROCAP) cap  1 Cap Oral DAILY   ??? diphenhydrAMINE (BENADRYL) injection 12.5 mg  12.5 mg IntraVENous Q6H PRN   ??? nicotine (NICODERM CQ) 14 mg/24 hr patch 1 Patch  1 Patch TransDERmal Q24H   ??? insulin glargine (LANTUS) injection 8 Units  8 Units SubCUTAneous DAILY   ??? glucose chewable tablet 16 g  4 Tab Oral PRN   ??? dextrose (D50W) injection Syrg 12.5-25 g  12.5-25 g IntraVENous PRN   ??? glucagon (GLUCAGEN) injection 1 mg  1 mg IntraMUSCular PRN       Care Plan discussed with: Patient/Family and Nurse    Total time spent with patient: 30 minutes.    Tyler Ballard Karren Cobble, MD

## 2012-05-28 NOTE — Progress Notes (Signed)
1135- Patient's blood sugar 57 upon routine check; alert & oriented with no complaints. Gave juice and ice cream and re-checked; came up to 73. Gave more juice and snack; came up to 80. Diabetic treatment center has adjusted the patient's sliding scale insulin for the remainder of stay. Will continue to monitor patient closely.

## 2012-05-28 NOTE — Other (Signed)
DTC Consult Note    Recommendations/ Comments: If appropriate, please consider:  1. Changing correction scale to High Sensitivity as pt is a dialysis patient and has Type 1 Diabetes.   2. A1c     A1c is followed by his dialysis center. Dx with Type 1 Diabetes at age 31. Rarely had to take Novolog because his blood sugar is usually < 160. Sliding scale 16-180 = 1 unit, 180-200 = 2 units, 201+ = 3 units.  However, he states that his blood sugar almost always goes low after taking the Novolog. Requires the Novolog usually only once per week. Reports his A1c levels are always < 5%.  Message sent to Dr. Freda Jackson with request.     Consult received for:  [x]              Assessment of home management     []              Meter/monitoring     []              Insulin instruction     []              New diagnosis     []              Outpatient education     []              Insulin pump patient     []              Insulin infusion     []              DKA/HHS    Chart reviewed and initial evaluation complete on Tyler Ballard.    Patient is a 31 y.o. male with Type 1 Diabetes on insulin injections: Lantus : 8 units daily, Novolog 1:20> 160 for a max dose of 3 units at home.    BG monitoring at home 2 times per day.  Patient reports BG levels at home trend: stable.    Assessed and instructed patient on the following: .     ??  insulin adjustments    Encouraged the following:   ?? regular blood sugar monitoring: 2-4 times daily    Provided patient with the following: []              Survival skills education materials               []              Insulin education materials               []              CHO counting education materials               [x]              Outpatient DTC contact number               []              Glucometer               []              Insulin start kit- vial/syringe               []              Insulin start kit- pen      A1c:   No results found for this basename: HBA1C,  [id number]       POC Glucose last 24hrs:    Lab Results   Component Value Date/Time    POC GLUCOSE 164 05/28/2012  6:10 AM    POC GLUCOSE 128 05/27/2012  9:17 PM    POC GLUCOSE 91 05/27/2012  4:38 PM    POC GLUCOSE 116 05/27/2012  1:19 PM    POC GLUCOSE 75 05/27/2012 12:25 PM    POC GLUCOSE 75 05/27/2012 12:09 PM    POC GLUCOSE 59 05/27/2012 11:50 AM    POC GLUCOSE 51 05/27/2012 11:33 AM       Lab Results   Component Value Date/Time    Creatinine 8.06 05/25/2012  9:41 AM       Current hospital DM medication: Lantus 8 units daily, Humalog normal sensitivity AC & HS    Will continue to follow as needed.    Thank you.   M. Dawn Aron Baba BSN, RN  [facility]  Pager: [phone]

## 2012-05-28 NOTE — Progress Notes (Signed)
Bedside and Verbal shift change report given to Janet RN (oncoming nurse) by Sarah RN (offgoing nurse).  Report given with SBAR, Kardex, Intake/Output, MAR and Recent Results.

## 2012-05-29 LAB — METABOLIC PANEL, BASIC
Anion gap: 8 mmol/L (ref 5–15)
BUN/Creatinine ratio: 4 — ABNORMAL LOW (ref 12–20)
BUN: 19 MG/DL (ref 6–20)
CO2: 26 MMOL/L (ref 21–32)
Calcium: 7.5 MG/DL — ABNORMAL LOW (ref 8.5–10.1)
Chloride: 107 MMOL/L (ref 97–108)
Creatinine: 4.76 MG/DL — ABNORMAL HIGH (ref 0.45–1.15)
GFR est AA: 17 mL/min/{1.73_m2} — ABNORMAL LOW (ref >60)
GFR est non-AA: 14 mL/min/{1.73_m2} — ABNORMAL LOW (ref >60)
Glucose: 63 MG/DL — ABNORMAL LOW (ref 65–100)
Potassium: 4.1 MMOL/L (ref 3.5–5.1)
Sodium: 141 MMOL/L (ref 136–145)

## 2012-05-29 LAB — CBC WITH AUTOMATED DIFF
ABS. BASOPHILS: 0 10*3/uL (ref 0.0–0.1)
ABS. EOSINOPHILS: 0.2 10*3/uL (ref 0.0–0.4)
ABS. LYMPHOCYTES: 0.5 10*3/uL — ABNORMAL LOW (ref 0.8–3.5)
ABS. MONOCYTES: 0.4 10*3/uL (ref 0.0–1.0)
ABS. NEUTROPHILS: 0.9 10*3/uL — ABNORMAL LOW (ref 1.8–8.0)
BASOPHILS: 1 % (ref 0–1)
EOSINOPHILS: 10 % — ABNORMAL HIGH (ref 0–7)
HCT: 23 % — ABNORMAL LOW (ref 36.6–50.3)
HGB: 7.2 g/dL — ABNORMAL LOW (ref 12.1–17.0)
LYMPHOCYTES: 26 % (ref 12–49)
MCH: 30.8 PG (ref 26.0–34.0)
MCHC: 31.3 g/dL (ref 30.0–36.5)
MCV: 98.3 FL (ref 80.0–99.0)
MONOCYTES: 19 % — ABNORMAL HIGH (ref 5–13)
NEUTROPHILS: 44 % (ref 32–75)
PLATELET: 158 10*3/uL (ref 150–400)
RBC: 2.34 M/uL — ABNORMAL LOW (ref 4.10–5.70)
RDW: 14.3 % (ref 11.5–14.5)
WBC: 2 10*3/uL — ABNORMAL LOW (ref 4.1–11.1)

## 2012-05-29 LAB — CULTURE, BLOOD, PAIRED: Culture result:: NO GROWTH

## 2012-05-29 LAB — GLUCOSE, POC
Glucose (POC): 111 mg/dL — ABNORMAL HIGH (ref 75–110)
Glucose (POC): 94 mg/dL (ref 75–110)
Glucose (POC): 117 mg/dL — ABNORMAL HIGH (ref 75–110)

## 2012-05-29 LAB — PHOSPHORUS: Phosphorus: 3.7 MG/DL (ref 2.5–4.9)

## 2012-05-29 LAB — MAGNESIUM: Magnesium: 1.5 MG/DL — ABNORMAL LOW (ref 1.6–2.4)

## 2012-05-29 MED ADMIN — sodium chloride (NS) flush 5-10 mL: INTRAVENOUS | @ 10:00:00 | NDC 87701099893

## 2012-05-29 MED ADMIN — diphenhydrAMINE (BENADRYL) injection 12.5 mg: INTRAVENOUS | @ 19:00:00 | NDC 63323066401

## 2012-05-29 MED ADMIN — HYDROmorphone (PF) (DILAUDID) injection 2 mg: INTRAVENOUS | @ 22:00:00 | NDC 00409255201

## 2012-05-29 MED ADMIN — HYDROmorphone (PF) 15 mg/30 ml (DILAUDID) PCA: INTRAVENOUS | @ 22:00:00 | NDC 02420030164

## 2012-05-29 MED ADMIN — 0.9% sodium chloride infusion: INTRAVENOUS | @ 04:00:00 | NDC 00409798309

## 2012-05-29 MED ADMIN — HYDROmorphone (PF) 15 mg/30 ml (DILAUDID) PCA: INTRAVENOUS | @ 08:00:00 | NDC 02420030164

## 2012-05-29 MED ADMIN — HYDROmorphone (PF) (DILAUDID) injection 2 mg: INTRAVENOUS | @ 01:00:00 | NDC 00409255201

## 2012-05-29 MED ADMIN — HYDROmorphone (PF) (DILAUDID) injection 2 mg: INTRAVENOUS | @ 04:00:00 | NDC 00409255201

## 2012-05-29 MED ADMIN — HYDROmorphone (PF) (DILAUDID) injection 2 mg: INTRAVENOUS | @ 16:00:00 | NDC 00409255201

## 2012-05-29 MED ADMIN — insulin glargine (LANTUS) injection 8 Units: SUBCUTANEOUS | @ 13:00:00 | NDC 09999222001

## 2012-05-29 MED ADMIN — HYDROmorphone (PF) (DILAUDID) injection 2 mg: INTRAVENOUS | @ 10:00:00 | NDC 00409255201

## 2012-05-29 MED ADMIN — diphenhydrAMINE (BENADRYL) injection 12.5 mg: INTRAVENOUS | @ 13:00:00 | NDC 63323066401

## 2012-05-29 MED ADMIN — sodium chloride (NS) flush 5-10 mL: INTRAVENOUS | @ 19:00:00 | NDC 87701099893

## 2012-05-29 MED ADMIN — calcium acetate (PHOSLO) tablet 1,334 mg: ORAL | @ 22:00:00 | NDC 00574011302

## 2012-05-29 MED ADMIN — heparin (porcine) injection 5,000 Units: SUBCUTANEOUS | @ 10:00:00 | NDC 25021040201

## 2012-05-29 MED ADMIN — sodium chloride (NS) flush 5-10 mL: INTRAVENOUS | @ 01:00:00 | NDC 87701099893

## 2012-05-29 MED ADMIN — calcium acetate (PHOSLO) tablet 1,334 mg: ORAL | @ 16:00:00 | NDC 00574011302

## 2012-05-29 MED ADMIN — lisinopril (PRINIVIL, ZESTRIL) tablet 10 mg: ORAL | @ 13:00:00 | NDC 68084006111

## 2012-05-29 MED ADMIN — amLODIPine (NORVASC) tablet 5 mg: ORAL | @ 13:00:00 | NDC 59762153006

## 2012-05-29 MED ADMIN — hydroxyurea (HYDREA) chemo cap 500 mg: ORAL | @ 01:00:00 | NDC 68084028411

## 2012-05-29 MED ADMIN — HYDROmorphone (PF) (DILAUDID) injection 2 mg: INTRAVENOUS | @ 19:00:00 | NDC 00409255201

## 2012-05-29 MED ADMIN — 0.9% sodium chloride infusion: INTRAVENOUS | @ 15:00:00 | NDC 87701099893

## 2012-05-29 MED ADMIN — ondansetron (ZOFRAN) injection 4 mg: INTRAVENOUS | @ 07:00:00 | NDC 00409475503

## 2012-05-29 MED ADMIN — heparin (porcine) injection 5,000 Units: SUBCUTANEOUS | @ 19:00:00 | NDC 25021040201

## 2012-05-29 MED ADMIN — diphenhydrAMINE (BENADRYL) injection 12.5 mg: INTRAVENOUS | @ 01:00:00 | NDC 63323066401

## 2012-05-29 MED ADMIN — ondansetron (ZOFRAN) injection 4 mg: INTRAVENOUS | @ 13:00:00 | NDC 00409475503

## 2012-05-29 MED ADMIN — HYDROmorphone (PF) (DILAUDID) injection 2 mg: INTRAVENOUS | @ 07:00:00 | NDC 00409255201

## 2012-05-29 MED ADMIN — diphenhydrAMINE (BENADRYL) injection 12.5 mg: INTRAVENOUS | @ 07:00:00 | NDC 00641037621

## 2012-05-29 MED ADMIN — calcium acetate (PHOSLO) tablet 1,334 mg: ORAL | @ 13:00:00 | NDC 00574011302

## 2012-05-29 MED FILL — HEPARIN (PORCINE) 5,000 UNIT/ML IJ SOLN: 5000 unit/mL | INTRAMUSCULAR | Qty: 1

## 2012-05-29 MED FILL — HYDRALAZINE 10 MG TAB: 10 mg | ORAL | Qty: 1

## 2012-05-29 MED FILL — CALCIUM ACETATE 667 MG TAB: 667 mg | ORAL | Qty: 2

## 2012-05-29 MED FILL — HYDROMORPHONE (PF) 1 MG/ML IJ SOLN: 1 mg/mL | INTRAMUSCULAR | Qty: 2

## 2012-05-29 MED FILL — BD POSIFLUSH NORMAL SALINE 0.9 % INJECTION SYRINGE: INTRAMUSCULAR | Qty: 10

## 2012-05-29 MED FILL — DIPHENHYDRAMINE HCL 50 MG/ML IJ SOLN: 50 mg/mL | INTRAMUSCULAR | Qty: 1

## 2012-05-29 MED FILL — ONDANSETRON (PF) 4 MG/2 ML INJECTION: 4 mg/2 mL | INTRAMUSCULAR | Qty: 2

## 2012-05-29 MED FILL — AMLODIPINE 5 MG TAB: 5 mg | ORAL | Qty: 1

## 2012-05-29 MED FILL — NEPHROCAPS 1 MG CAPSULE: 1 mg | ORAL | Qty: 1

## 2012-05-29 MED FILL — HYDROMORPHONE 15 MG/30 ML (0.5 MG/ML) IN 0.9% SOD.CHLORIDE INFUSION: 15 mg/30 mL (0.5 mg/mL) | INTRAVENOUS | Qty: 30

## 2012-05-29 MED FILL — INSULIN GLARGINE 100 UNIT/ML INJECTION: 100 unit/mL | SUBCUTANEOUS | Qty: 0.08

## 2012-05-29 MED FILL — NICOTINE 14 MG/24 HR DAILY PATCH: 14 mg/24 hr | TRANSDERMAL | Qty: 1

## 2012-05-29 MED FILL — LISINOPRIL 10 MG TAB: 10 mg | ORAL | Qty: 1

## 2012-05-29 MED FILL — SODIUM CHLORIDE 0.9 % IV: INTRAVENOUS | Qty: 1000

## 2012-05-29 NOTE — Progress Notes (Signed)
Hospitalist Progress Note         Dionisio Paschal, MD   Romilda Joy [phone]  Call physician on-call through the operator 7pm-7am    Daily Progress Note: 05/29/2012    PCP- Phys Other, MD  Assessment/Plan:   1. LLB pneumonia, poa- On zosyn and Levaquin. Zosyn stopped per ID, cont levaquin, Follow Cx , no growth thus far, to complete levaquin course per ID  2. Sickle cell crisis, poa - Pt on dilaudid PCA, encourge po hydration, 02 as needed . Continue Hydrea. PCA changed to PRN only  Yesterday with hope to d/c, pt with more pain, and now increased dose PCA. To follow closely and adjust as appropriate, added zofran for nausea,   3. ESRD on HD - HD as per nephrology   4. Anemia - Hb 7.5-7.9 , transfuse 1 unit 6/11 for hb on 7.2 , hematology f/up , cont to monitor  5. Hyponatremia - monitor, stable    6. HTN- BP consistently higher, , pain contributing ? ,increase norvasc to 10   7. DM- resume lantus, ssi   8. Tobaco abuse - nicotine, counseled   9. Chronic pain - pain management as per hematology   10. DVT prophylaxis- heparin  11. Plan- Home on po abx soon once pain controlled       Subjective:     BP elevated,pain not well controlled yet , denies SOB       Review of Systems:   A comprehensive review of systems was negative except for that written in the HPI.    Objective:   Physical Exam:     BP 150/73   Pulse 90   Temp 97.1 ??F (36.2 ??C)   Resp 18   Ht 5\' 5"  (1.651 m)   Wt 203 lb 4.2 oz   BMI 33.82 kg/m2   SpO2 99% O2 Flow Rate (L/min): 2 l/min O2 Device: Room air    Temp (24hrs), Avg:97 ??F (36.1 ??C), Min:96.6 ??F (35.9 ??C), Max:97.6 ??F (36.4 ??C)    06/11 0700 - 06/11 1859  In: 240 [P.O.:240]  Out: -    06/09 1900 - 06/11 0659  In: 1132.5 [P.O.:240; I.V.:892.5]  Out: 09811     General: Well nourished, calm, appropriate, NAD  ambulatory  HEENT: PERRLA bilaterally, neck supple, no JVD, no thyromegaly, no lymphadenopathy   CVS: RRR, no murmurs, rubs or gallops   Lungs: diminished left base otherwise clear to  auscultation, no wheezing, rales or rhonchi   GI: soft nontender, NABS. No rebound or guarding   EXT: 2+ pulses bilaterally, trace edema, fistula right arm   Neuro: Cranial nerves intact, strength 5/5 in all 4 extremities, no gross focal neuro deficits, AAO X3   Skin: intact   Psych: normal affect    Data Review:       Recent Days:  Recent Labs   Gi Or Norman 05/29/12 0322 05/27/12 0343    WBC 2.0* 3.0*    HGB 7.2* 8.3*    HCT 23.0* 26.8*    PLT 158 165     Recent Labs   Basename 05/29/12 0322    NA 141    K 4.1    CL 107    CO2 26    GLU 63*    BUN 19    CREA 4.76*    CA 7.5*    MG 1.5*    PHOS 3.7    ALB --    TBIL --    SGOT --  INR --     No results found for this basename: PH:3,PCO2:3,PO2:3,HCO3:3,FIO2:3 in the last 72 hours    24 Hour Results:  Recent Results (from the past 24 hour(s))   GLUCOSE, POC    Collection Time    05/28/12 11:56 AM       Component Value Range    POC GLUCOSE 73 (*) 75 - 110 mg/dL    Performed by Rondell Reams     GLUCOSE, POC    Collection Time    05/28/12 12:31 PM       Component Value Range    POC GLUCOSE 80  75 - 110 mg/dL    Performed by Rondell Reams     GLUCOSE, POC    Collection Time    05/28/12  4:45 PM       Component Value Range    POC GLUCOSE 98  75 - 110 mg/dL    Performed by Rondell Reams     GLUCOSE, POC    Collection Time    05/28/12  9:13 PM       Component Value Range    POC GLUCOSE 111 (*) 75 - 110 mg/dL    Performed by Marc Morgans JANET     CBC WITH AUTOMATED DIFF    Collection Time    05/29/12  3:22 AM       Component Value Range    WBC 2.0 (*) 4.1 - 11.1 K/uL    RBC 2.34 (*) 4.10 - 5.70 M/uL    HGB 7.2 (*) 12.1 - 17.0 g/dL    HCT 16.1 (*) 09.6 - 50.3 %    MCV 98.3  80.0 - 99.0 FL    MCH 30.8  26.0 - 34.0 PG    MCHC 31.3  30.0 - 36.5 g/dL    RDW 04.5  40.9 - 81.1 %    PLATELET 158  150 - 400 K/uL    NEUTROPHILS 44  32 - 75 %    LYMPHOCYTES 26  12 - 49 %    MONOCYTES 19 (*) 5 - 13 %    EOSINOPHILS 10 (*) 0 - 7 %    BASOPHILS 1  0 - 1 %    ABS. NEUTROPHILS 0.9 (*) 1.8 - 8.0  K/UL    ABS. LYMPHOCYTES 0.5 (*) 0.8 - 3.5 K/UL    ABS. MONOCYTES 0.4  0.0 - 1.0 K/UL    ABS. EOSINOPHILS 0.2  0.0 - 0.4 K/UL    ABS. BASOPHILS 0.0  0.0 - 0.1 K/UL    DF SMEAR SCANNED      RBC COMMENTS OVALOCYTES PRESENT     METABOLIC PANEL, BASIC    Collection Time    05/29/12  3:22 AM       Component Value Range    Sodium 141  136 - 145 MMOL/L    Potassium 4.1  3.5 - 5.1 MMOL/L    Chloride 107  97 - 108 MMOL/L    CO2 26  21 - 32 MMOL/L    Anion gap 8  5 - 15 mmol/L    Glucose 63 (*) 65 - 100 MG/DL    BUN 19  6 - 20 MG/DL    Creatinine 9.14 (*) 0.45 - 1.15 MG/DL    BUN/Creatinine ratio 4 (*) 12 - 20      GFR est AA 17 (*) >60 ml/min/1.79m2    GFR est non-AA 14 (*) >60 ml/min/1.55m2    Calcium 7.5 (*) 8.5 - 10.1 MG/DL  MAGNESIUM    Collection Time    June 01, 2012  3:22 AM       Component Value Range    Magnesium 1.5 (*) 1.6 - 2.4 MG/DL   PHOSPHORUS    Collection Time    06/01/12  3:22 AM       Component Value Range    Phosphorus 3.7  2.5 - 4.9 MG/DL       Problem List:  Problem List as of 2012/06/01  Never Reviewed      Codes Class Noted - Resolved    *Sickle cell crisis 282.62  05/24/2012 - Present              Medications reviewed  Current Facility-Administered Medications   Medication Dose Route Frequency   ??? 0.9% sodium chloride infusion  25 mL/hr IntraVENous CONTINUOUS   ??? acetaminophen (TYLENOL) tablet 650 mg  650 mg Oral Q4H PRN   ??? insulin lispro (HUMALOG) injection   SubCUTAneous TIDAC   ??? HYDROmorphone (PF) (DILAUDID) injection 2 mg  2 mg IntraVENous Q3H PRN   ??? ondansetron (ZOFRAN) injection 4 mg  4 mg IntraVENous Q6H PRN   ??? HYDROmorphone (PF) 15 mg/30 ml (DILAUDID) PCA   IntraVENous CONTINUOUS   ??? levofloxacin (LEVAQUIN) tablet 500 mg  500 mg Oral Q48H   ??? hydroxyurea (HYDREA) chemo cap 500 mg  500 mg Oral Q MON, WED & FRI   ??? amLODIPine (NORVASC) tablet 5 mg  5 mg Oral DAILY   ??? calcium acetate (PHOSLO) tablet 1,334 mg  2 Tab Oral TID WITH MEALS   ??? lisinopril (PRINIVIL, ZESTRIL) tablet 10 mg  10 mg Oral DAILY    ??? sodium chloride (NS) flush 5-10 mL  5-10 mL IntraVENous Q8H   ??? sodium chloride (NS) flush 5-10 mL  5-10 mL IntraVENous PRN   ??? heparin (porcine) injection 5,000 Units  5,000 Units SubCUTAneous Q8H   ??? 0.9% sodium chloride infusion  75 mL/hr IntraVENous CONTINUOUS   ??? B complex-vitaminC-folic acid (NEPHROCAP) cap  1 Cap Oral DAILY   ??? diphenhydrAMINE (BENADRYL) injection 12.5 mg  12.5 mg IntraVENous Q6H PRN   ??? nicotine (NICODERM CQ) 14 mg/24 hr patch 1 Patch  1 Patch TransDERmal Q24H   ??? insulin glargine (LANTUS) injection 8 Units  8 Units SubCUTAneous DAILY   ??? glucose chewable tablet 16 g  4 Tab Oral PRN   ??? dextrose (D50W) injection Syrg 12.5-25 g  12.5-25 g IntraVENous PRN   ??? glucagon (GLUCAGEN) injection 1 mg  1 mg IntraMUSCular PRN       Care Plan discussed with: Patient/Family and Nurse    Total time spent with patient: 30 minutes.    Claira Jeter Karren Cobble, MD

## 2012-05-29 NOTE — Progress Notes (Signed)
Bedside shift change report given to [place] Control and instrumentation engineer) by Tresa Endo (offgoing nurse).  Report given with SBAR, Kardex, Intake/Output, MAR and Recent Results.

## 2012-05-29 NOTE — Progress Notes (Signed)
Hematology-Oncology Progress Note    Tyler Ballard  1981/09/19  [id number]  05/29/2012    Follow-up for: sickle cell anemia     [x]         Chart notes since last visit reviewed   [x]         Medications reviewed for allergies and interactions       Case discussed with the following:         []         Case Manager                    []         Nursing Staff                                                                         []         Pathologist                                                                        []         FAMILY      Subjective:     Spoke with patient who complains of: pt.  Still has some back and leg pain, main problem today is leg swelling and fluid retention, wants to hold off until tomorrow before tapering pain meds    Objective:     Patient Vitals for the past 24 hrs:   BP Temp Pulse Resp SpO2 Weight   05/29/12 0738 147/77 mmHg 97.1 ??F (36.2 ??C) 88  18  98 % -   05/29/12 0439 161/85 mmHg 96.8 ??F (36 ??C) 90  18  98 % 203 lb 4.2 oz   05/28/12 2328 141/69 mmHg 96.6 ??F (35.9 ??C) 93  18  100 % -   05/28/12 1955 175/86 mmHg 97.6 ??F (36.4 ??C) 88  18  100 % -   05/28/12 1839 200/105 mmHg - 79  - - -   05/28/12 1800 184/85 mmHg - 85  - - -   05/28/12 1736 193/90 mmHg - 91  - - -   05/28/12 1704 204/98 mmHg - 84  - - -   05/28/12 1634 194/95 mmHg - 85  - - -   05/28/12 1608 175/92 mmHg - 78  - - -   05/28/12 1535 193/97 mmHg - 85  - - -   05/28/12 1505 168/86 mmHg - 88  - - -   05/28/12 1434 208/98 mmHg - 82  - - -   05/28/12 1417 193/98 mmHg - 82  - - -   05/28/12 1106 175/83 mmHg 97.5 ??F (36.4 ??C) 83  18  100 % -   05/28/12 1022 - - - 18  96 % -       REVIEW OF SYSTEMS:    Constitutional: negative fever, negative chills, negative weight loss  Eyes:   negative visual changes  ENT:   negative sore throat, tongue or lip swelling  Respiratory:  negative cough, negative dyspnea  Cards:  negative for chest pain, palpitations, lower extremity edema  GI:   negative for nausea, vomiting, diarrhea, and  abdominal pain  Neuro:  negative for headaches, dizziness, vertigo  [x]                         Full ROS o/w normal/non contributor    Constitutional:  Patient looks  []         Sick  []         Frail  [x]         Better                                                 []         Depressed    HEENT:  [x]    NC                         []    AT               []     ALOPECIA           Eyes: [x]    Normal               []     Icteric  Oropharynx: [x]     Normal                  []   Thrush               []    Dry  Mucositis: [x]     None                 Grade: []         I  []         II  []         III  []         IV  Neck:   [x]    Supple                  []   Rigid               JVD:    [x]    ABSENT       []    PRESENT  Lymphadenopathy:   []    None Noted            []    PRESENT    Chest:  []    Clear               []     Rhonchi                      Dec'd @     []   Right Base           []    Left Base    CV:             []    Regular              []   Irregular               []    Tachy                []    Murmur  Abdominal:   [x]     Soft              []   NON-tender               []    Tender      BS:    [x]    ABSENT                   []    PRESENT  Liver:     [x]   NON-palp                  []    EDGE- palp  Spleen: [x]    NON-palp                   []   EDGE - palp  Mass:   [x]    ABSENT                          []   PRESENT  Extr:    []   Lymphedema             []    Cyanosis      []   Clubbing  Edema:     []    NONE       []    PRESENT  Skin:  Intact [x]            Purpura []         Rash: [x]    ABSENT       []   PRESENT  Neuro:  [x]         Normal  []         Confused      Available labs reviewed:  Labs:    Recent Results (from the past 24 hour(s))   GLUCOSE, POC    Collection Time    05/28/12 11:32 AM       Component Value Range    POC GLUCOSE 57 (*) 75 - 110 mg/dL    Performed by Rondell Reams     GLUCOSE, POC    Collection Time    05/28/12 11:56 AM       Component Value Range    POC GLUCOSE 73 (*) 75 - 110 mg/dL    Performed by Rondell Reams      GLUCOSE, POC    Collection Time    05/28/12 12:31 PM       Component Value Range    POC GLUCOSE 80  75 - 110 mg/dL    Performed by Rondell Reams     GLUCOSE, POC    Collection Time    05/28/12  4:45 PM       Component Value Range    POC GLUCOSE 98  75 - 110 mg/dL    Performed by Rondell Reams     GLUCOSE, POC    Collection Time    05/28/12  9:13 PM       Component Value Range    POC GLUCOSE 111 (*) 75 - 110 mg/dL    Performed by BROMLEY JANET     CBC WITH AUTOMATED DIFF    Collection Time    05/29/12  3:22 AM       Component Value Range    WBC 2.0 (*) 4.1 - 11.1 K/uL    RBC 2.34 (*) 4.10 - 5.70 M/uL    HGB 7.2 (*) 12.1 - 17.0 g/dL    HCT 16.1 (*) 09.6 - 50.3 %    MCV 98.3  80.0 - 99.0 FL    MCH 30.8  26.0 - 34.0 PG    MCHC 31.3  30.0 -  36.5 g/dL    RDW 16.1  09.6 - 04.5 %    PLATELET 158  150 - 400 K/uL    NEUTROPHILS 44  32 - 75 %    LYMPHOCYTES 26  12 - 49 %    MONOCYTES 19 (*) 5 - 13 %    EOSINOPHILS 10 (*) 0 - 7 %    BASOPHILS 1  0 - 1 %    ABS. NEUTROPHILS 0.9 (*) 1.8 - 8.0 K/UL    ABS. LYMPHOCYTES 0.5 (*) 0.8 - 3.5 K/UL    ABS. MONOCYTES 0.4  0.0 - 1.0 K/UL    ABS. EOSINOPHILS 0.2  0.0 - 0.4 K/UL    ABS. BASOPHILS 0.0  0.0 - 0.1 K/UL    DF SMEAR SCANNED      RBC COMMENTS OVALOCYTES PRESENT     METABOLIC PANEL, BASIC    Collection Time    05/29/12  3:22 AM       Component Value Range    Sodium 141  136 - 145 MMOL/L    Potassium 4.1  3.5 - 5.1 MMOL/L    Chloride 107  97 - 108 MMOL/L    CO2 26  21 - 32 MMOL/L    Anion gap 8  5 - 15 mmol/L    Glucose 63 (*) 65 - 100 MG/DL    BUN 19  6 - 20 MG/DL    Creatinine 4.09 (*) 0.45 - 1.15 MG/DL    BUN/Creatinine ratio 4 (*) 12 - 20      GFR est AA 17 (*) >60 ml/min/1.76m2    GFR est non-AA 14 (*) >60 ml/min/1.3m2    Calcium 7.5 (*) 8.5 - 10.1 MG/DL   MAGNESIUM    Collection Time    05/29/12  3:22 AM       Component Value Range    Magnesium 1.5 (*) 1.6 - 2.4 MG/DL   PHOSPHORUS    Collection Time    05/29/12  3:22 AM       Component Value Range    Phosphorus 3.7  2.5 - 4.9  MG/DL       Available Xrays reviewed:    Chemotherapy monitored and toxicities assessed:    Assessment and Plan   1. Sickle cell pain crisis... Probably precipitated by pneumonia.... Pt. Says his crises usually last 3-4 days, is followed at [facility] in the fellow's clinic  2. Anemia.. Secondary to #1 +/_ esrd... Will transfuse one unit on dialysis today  3. Pneumonia... Cultures negative so far, will continue antibiotics  4. ESRD... For dialysis today  Harlen Labs, MD

## 2012-05-29 NOTE — Other (Signed)
DTC Progress Note    Recommendations/ Comments: Noted hypoglycemia yesterday after receiving correction insulin with breakfast. Thank you for changing correction scale to High Sensitivity.  Will continue to monitor.     Chart reviewed on [employer].    Patient is a 31 y.o. male with Type 1 Diabetes on insulin injections: Lantus : 8 units daily, Novolog 160-180 = 1 unit, 180-200 = 2 units, 200+ = 30 units at home.    POC Glucose last 24hrs:   Lab Results   Component Value Date/Time    POC GLUCOSE 111 05/28/2012  9:13 PM    POC GLUCOSE 98 05/28/2012  4:45 PM    POC GLUCOSE 80 05/28/2012 12:31 PM    POC GLUCOSE 73 05/28/2012 11:56 AM    POC GLUCOSE 57 05/28/2012 11:32 AM    POC GLUCOSE 164 05/28/2012  6:10 AM    POC GLUCOSE 128 05/27/2012  9:17 PM    POC GLUCOSE 91 05/27/2012  4:38 PM         Lab Results   Component Value Date/Time    Creatinine 4.76 05/29/2012  3:22 AM       PO intake: Patient Vitals for the past 72 hrs:   % Diet Eaten   05/28/12 0819 50 %   05/27/12 1700 75 %   05/27/12 1300 50 %   05/27/12 0900 25 %         Current hospital DM medications: Humalog High Sensitivity AC & HS. Lantus 8 units daily    Will continue to follow as needed.    Thank you.  M. Dawn Aron Baba BSN, RN  [facility]  Pager: [phone]

## 2012-05-29 NOTE — Progress Notes (Signed)
Followup for ESRD.  Wow! 5 kg out yesterday - and well-tolerated  No new c/o. Says the cough and pain are coming under control    Chest clear anteriorly. No jvd. Regular, mildly fast heart. Some LE peripheral edema persists. Access OK.     Dialysis support as long as he must stay.Tyler Ballard    LABS:   Recent Results (from the past 24 hour(s))   GLUCOSE, POC    Collection Time    05/28/12 11:32 AM       Component Value Range    POC GLUCOSE 57 (*) 75 - 110 mg/dL    Performed by Rondell Reams     GLUCOSE, POC    Collection Time    05/28/12 11:56 AM       Component Value Range    POC GLUCOSE 73 (*) 75 - 110 mg/dL    Performed by Rondell Reams     GLUCOSE, POC    Collection Time    05/28/12 12:31 PM       Component Value Range    POC GLUCOSE 80  75 - 110 mg/dL    Performed by Rondell Reams     GLUCOSE, POC    Collection Time    05/28/12  4:45 PM       Component Value Range    POC GLUCOSE 98  75 - 110 mg/dL    Performed by Rondell Reams     GLUCOSE, POC    Collection Time    05/28/12  9:13 PM       Component Value Range    POC GLUCOSE 111 (*) 75 - 110 mg/dL    Performed by Marc Morgans JANET     CBC WITH AUTOMATED DIFF    Collection Time    05/29/12  3:22 AM       Component Value Range    WBC 2.0 (*) 4.1 - 11.1 K/uL    RBC 2.34 (*) 4.10 - 5.70 M/uL    HGB 7.2 (*) 12.1 - 17.0 g/dL    HCT 16.1 (*) 09.6 - 50.3 %    MCV 98.3  80.0 - 99.0 FL    MCH 30.8  26.0 - 34.0 PG    MCHC 31.3  30.0 - 36.5 g/dL    RDW 04.5  40.9 - 81.1 %    PLATELET 158  150 - 400 K/uL    NEUTROPHILS 44  32 - 75 %    LYMPHOCYTES 26  12 - 49 %    MONOCYTES 19 (*) 5 - 13 %    EOSINOPHILS 10 (*) 0 - 7 %    BASOPHILS 1  0 - 1 %    ABS. NEUTROPHILS 0.9 (*) 1.8 - 8.0 K/UL    ABS. LYMPHOCYTES 0.5 (*) 0.8 - 3.5 K/UL    ABS. MONOCYTES 0.4  0.0 - 1.0 K/UL    ABS. EOSINOPHILS 0.2  0.0 - 0.4 K/UL    ABS. BASOPHILS 0.0  0.0 - 0.1 K/UL    DF SMEAR SCANNED      RBC COMMENTS OVALOCYTES PRESENT     METABOLIC PANEL, BASIC    Collection Time    05/29/12  3:22 AM       Component Value Range     Sodium 141  136 - 145 MMOL/L    Potassium 4.1  3.5 - 5.1 MMOL/L    Chloride 107  97 - 108 MMOL/L    CO2 26  21 - 32 MMOL/L  Anion gap 8  5 - 15 mmol/L    Glucose 63 (*) 65 - 100 MG/DL    BUN 19  6 - 20 MG/DL    Creatinine 1.61 (*) 0.45 - 1.15 MG/DL    BUN/Creatinine ratio 4 (*) 12 - 20      GFR est AA 17 (*) >60 ml/min/1.83m2    GFR est non-AA 14 (*) >60 ml/min/1.52m2    Calcium 7.5 (*) 8.5 - 10.1 MG/DL   MAGNESIUM    Collection Time    05/29/12  3:22 AM       Component Value Range    Magnesium 1.5 (*) 1.6 - 2.4 MG/DL   PHOSPHORUS    Collection Time    05/29/12  3:22 AM       Component Value Range    Phosphorus 3.7  2.5 - 4.9 MG/DL       Patient Vitals for the past 12 hrs:   Temp Pulse Resp BP SpO2   05/29/12 0738 97.1 ??F (36.2 ??C) 88  18  147/77 mmHg 98 %   05/29/12 0439 96.8 ??F (36 ??C) 90  18  161/85 mmHg 98 %   05/28/12 2328 96.6 ??F (35.9 ??C) 93  18  141/69 mmHg 100 %

## 2012-05-29 NOTE — Progress Notes (Signed)
TRANSFER - OUT REPORT:    Verbal report given to Tonya, RN(name) on [employer]  being transferred to [employer]) for routine progression of care       Report consisted of patient???s Situation, Background, Assessment and   Recommendations(SBAR).     Information from the following report(s) SBAR, Kardex, Intake/Output, MAR and Recent Results was reviewed with the receiving nurse.    Opportunity for questions and clarification was provided.

## 2012-05-29 NOTE — Other (Signed)
Pressure Ulcer Documentation  (COMPLETE ONE LABEL PER PRESSURE ULCER)  For further information, please review corresponding Wound Care flowsheet.      Jaimin Mcnaught has:    No pressure ulcer noted and pressure ulcer prevention initiated.      Hurshel Party, RN

## 2012-05-30 LAB — GLUCOSE, POC
Glucose (POC): 122 mg/dL — ABNORMAL HIGH (ref 75–110)
Glucose (POC): 126 mg/dL — ABNORMAL HIGH (ref 75–110)
Glucose (POC): 209 mg/dL — ABNORMAL HIGH (ref 75–110)
Glucose (POC): 27 mg/dL — CL (ref 75–110)
Glucose (POC): 30 mg/dL — CL (ref 75–110)
Glucose (POC): 34 mg/dL — CL (ref 75–110)
Glucose (POC): 39 mg/dL — CL (ref 75–110)
Glucose (POC): 84 mg/dL (ref 75–110)

## 2012-05-30 MED ADMIN — heparin (porcine) injection 5,000 Units: SUBCUTANEOUS | @ 01:00:00 | NDC 25021040201

## 2012-05-30 MED ADMIN — HYDROmorphone (PF) (DILAUDID) injection 2 mg: INTRAVENOUS | @ 20:00:00 | NDC 00409255201

## 2012-05-30 MED ADMIN — nicotine (NICODERM CQ) 14 mg/24 hr patch 1 Patch: TRANSDERMAL | @ 01:00:00

## 2012-05-30 MED ADMIN — diphenhydrAMINE (BENADRYL) injection 12.5 mg: INTRAVENOUS | @ 20:00:00 | NDC 63323066401

## 2012-05-30 MED ADMIN — HYDROmorphone (PF) (DILAUDID) injection 2 mg: INTRAVENOUS | @ 04:00:00 | NDC 00409255201

## 2012-05-30 MED ADMIN — HYDROmorphone (PF) (DILAUDID) injection 2 mg: INTRAVENOUS | @ 10:00:00 | NDC 00409255201

## 2012-05-30 MED ADMIN — diphenhydrAMINE (BENADRYL) injection 12.5 mg: INTRAVENOUS | @ 13:00:00 | NDC 00641037621

## 2012-05-30 MED ADMIN — diphenhydrAMINE (BENADRYL) injection 12.5 mg: INTRAVENOUS | @ 01:00:00 | NDC 63323066401

## 2012-05-30 MED ADMIN — dextrose (D50W) injection Syrg 12.5-25 g: INTRAVENOUS | @ 15:00:00 | NDC 00409751716

## 2012-05-30 MED ADMIN — calcium acetate (PHOSLO) tablet 1,334 mg: ORAL | @ 23:00:00 | NDC 00574011302

## 2012-05-30 MED ADMIN — levofloxacin (LEVAQUIN) tablet 500 mg: ORAL | @ 14:00:00 | NDC 68084048211

## 2012-05-30 MED ADMIN — HYDROmorphone (PF) (DILAUDID) injection 2 mg: INTRAVENOUS | @ 17:00:00 | NDC 00409255201

## 2012-05-30 MED ADMIN — HYDROmorphone (PF) (DILAUDID) injection 2 mg: INTRAVENOUS | @ 13:00:00 | NDC 00409255201

## 2012-05-30 MED ADMIN — HYDROmorphone (PF) (DILAUDID) injection 2 mg: INTRAVENOUS | @ 01:00:00 | NDC 00409255201

## 2012-05-30 MED ADMIN — diphenhydrAMINE (BENADRYL) injection 12.5 mg: INTRAVENOUS | @ 07:00:00 | NDC 63323066401

## 2012-05-30 MED ADMIN — hydroxyurea (HYDREA) chemo cap 500 mg: ORAL | @ 23:00:00 | NDC 68084028411

## 2012-05-30 MED ADMIN — HYDROmorphone (PF) (DILAUDID) injection 2 mg: INTRAVENOUS | @ 23:00:00 | NDC 00409255201

## 2012-05-30 MED ADMIN — amLODIPine (NORVASC) tablet 10 mg: ORAL | @ 14:00:00 | NDC 59762153006

## 2012-05-30 MED ADMIN — hydrALAZINE (APRESOLINE) 20 mg/mL injection 10 mg: INTRAVENOUS | @ 23:00:00 | NDC 63323061401

## 2012-05-30 MED ADMIN — dextrose (D50W) injection Syrg 12.5-25 g: INTRAVENOUS | @ 23:00:00 | NDC 00409751716

## 2012-05-30 MED ADMIN — sodium chloride (NS) flush 5-10 mL: INTRAVENOUS | @ 10:00:00 | NDC 87701099893

## 2012-05-30 MED ADMIN — insulin glargine (LANTUS) injection 8 Units: SUBCUTANEOUS | @ 13:00:00 | NDC 09999222001

## 2012-05-30 MED ADMIN — sodium chloride (NS) flush 5-10 mL: INTRAVENOUS | @ 04:00:00 | NDC 87701099893

## 2012-05-30 MED ADMIN — HYDROmorphone (PF) 15 mg/30 ml (DILAUDID) PCA: INTRAVENOUS | @ 09:00:00 | NDC 02420030164

## 2012-05-30 MED ADMIN — calcium acetate (PHOSLO) tablet 1,334 mg: ORAL | @ 14:00:00 | NDC 00574011302

## 2012-05-30 MED ADMIN — HYDROmorphone (PF) (DILAUDID) injection 2 mg: INTRAVENOUS | @ 07:00:00 | NDC 00409255201

## 2012-05-30 MED ADMIN — heparin (porcine) injection 5,000 Units: SUBCUTANEOUS | @ 10:00:00 | NDC 25021040201

## 2012-05-30 MED ADMIN — lisinopril (PRINIVIL, ZESTRIL) tablet 10 mg: ORAL | @ 14:00:00 | NDC 68084006111

## 2012-05-30 MED FILL — HYDROMORPHONE (PF) 1 MG/ML IJ SOLN: 1 mg/mL | INTRAMUSCULAR | Qty: 2

## 2012-05-30 MED FILL — DIPHENHYDRAMINE HCL 50 MG/ML IJ SOLN: 50 mg/mL | INTRAMUSCULAR | Qty: 1

## 2012-05-30 MED FILL — CALCIUM ACETATE 667 MG TAB: 667 mg | ORAL | Qty: 2

## 2012-05-30 MED FILL — LEVOFLOXACIN 500 MG TAB: 500 mg | ORAL | Qty: 1

## 2012-05-30 MED FILL — BD POSIFLUSH NORMAL SALINE 0.9 % INJECTION SYRINGE: INTRAMUSCULAR | Qty: 20

## 2012-05-30 MED FILL — HYDROXYUREA 500 MG CAPSULE: 500 mg | ORAL | Qty: 1

## 2012-05-30 MED FILL — INSULIN LISPRO 100 UNIT/ML INJECTION: 100 unit/mL | SUBCUTANEOUS | Qty: 1

## 2012-05-30 MED FILL — DEXTROSE 50% IN WATER (D50W) IV SYRG: INTRAVENOUS | Qty: 50

## 2012-05-30 MED FILL — HEPARIN (PORCINE) 5,000 UNIT/ML IJ SOLN: 5000 unit/mL | INTRAMUSCULAR | Qty: 1

## 2012-05-30 MED FILL — NICOTINE 14 MG/24 HR DAILY PATCH: 14 mg/24 hr | TRANSDERMAL | Qty: 1

## 2012-05-30 MED FILL — HYDRALAZINE 20 MG/ML IJ SOLN: 20 mg/mL | INTRAMUSCULAR | Qty: 1

## 2012-05-30 MED FILL — HYDROMORPHONE 15 MG/30 ML (0.5 MG/ML) IN 0.9% SOD.CHLORIDE INFUSION: 15 mg/30 mL (0.5 mg/mL) | INTRAVENOUS | Qty: 30

## 2012-05-30 MED FILL — INSULIN GLARGINE 100 UNIT/ML INJECTION: 100 unit/mL | SUBCUTANEOUS | Qty: 0.08

## 2012-05-30 MED FILL — BD POSIFLUSH NORMAL SALINE 0.9 % INJECTION SYRINGE: INTRAMUSCULAR | Qty: 10

## 2012-05-30 MED FILL — AMLODIPINE 5 MG TAB: 5 mg | ORAL | Qty: 2

## 2012-05-30 MED FILL — HYDROMORPHONE (PF) 1 MG/ML IJ SOLN: 1 mg/mL | INTRAMUSCULAR | Qty: 1

## 2012-05-30 MED FILL — LISINOPRIL 10 MG TAB: 10 mg | ORAL | Qty: 1

## 2012-05-30 MED FILL — NEPHROCAPS 1 MG CAPSULE: 1 mg | ORAL | Qty: 1

## 2012-05-30 NOTE — Progress Notes (Signed)
-  Hematology / Oncology ([facility]) -    -S-  Pain persists at back and legs, no new problems    -O-    Visit Vitals   Item Reading   ??? BP 183/85   ??? Pulse 81   ??? Temp 98 ??F (36.7 ??C)   ??? Resp 16   ??? Ht 5\' 5"  (1.651 m)   ??? Wt 203 lb 4.2 oz   ??? BMI 33.82 kg/m2   ??? SpO2 100%       -Labs-  Glu 84    -Assessment + Plan-     1. Sickle cell pain crisis... Probably precipitated by pneumonia.... Pt. Says his crises usually last 3-4 days, is followed at [facility] in the fellow's clinic.  On hydrea  2. Anemia.. Secondary to #1 +/- esrd... To receive one unit on dialysis today  3. Pneumonia... Cultures negative, continues antibiotics  4. ESRD... For dialysis today  4.

## 2012-05-30 NOTE — Progress Notes (Signed)
Nephrology Progress Note    Date of Admission : 05/24/2012    CC:  Follow up for ESRD       Assessment and Plan   ESRD : HD today per schedule. Continue with TW challenge   Anemia : SC D + ESRD. For transfusion today at HD   HTN : uncontrolled   SCC: still reports back and leg pains . Mx per Dr C   LLL PNA : on Abx     For other plans, see orders.    Interval History:  No new events   Reports ongoing pain -- but improving   Due to for PRBC     Current Facility-Administered Medications   Medication Dose Route Frequency   ??? 0.9% sodium chloride infusion  25 mL/hr IntraVENous CONTINUOUS   ??? acetaminophen (TYLENOL) tablet 650 mg  650 mg Oral Q4H PRN   ??? amLODIPine (NORVASC) tablet 10 mg  10 mg Oral DAILY   ??? metoclopramide HCl (REGLAN) injection 10 mg  10 mg IntraVENous Q6H PRN   ??? hydrALAZINE (APRESOLINE) tablet 10 mg  10 mg Oral Q6H PRN   ??? insulin lispro (HUMALOG) injection   SubCUTAneous TIDAC   ??? HYDROmorphone (PF) (DILAUDID) injection 2 mg  2 mg IntraVENous Q3H PRN   ??? ondansetron (ZOFRAN) injection 4 mg  4 mg IntraVENous Q6H PRN   ??? HYDROmorphone (PF) 15 mg/30 ml (DILAUDID) PCA   IntraVENous CONTINUOUS   ??? levofloxacin (LEVAQUIN) tablet 500 mg  500 mg Oral Q48H   ??? hydroxyurea (HYDREA) chemo cap 500 mg  500 mg Oral Q MON, WED & FRI   ??? calcium acetate (PHOSLO) tablet 1,334 mg  2 Tab Oral TID WITH MEALS   ??? lisinopril (PRINIVIL, ZESTRIL) tablet 10 mg  10 mg Oral DAILY   ??? sodium chloride (NS) flush 5-10 mL  5-10 mL IntraVENous Q8H   ??? sodium chloride (NS) flush 5-10 mL  5-10 mL IntraVENous PRN   ??? heparin (porcine) injection 5,000 Units  5,000 Units SubCUTAneous Q8H   ??? 0.9% sodium chloride infusion  75 mL/hr IntraVENous CONTINUOUS   ??? B complex-vitaminC-folic acid (NEPHROCAP) cap  1 Cap Oral DAILY   ??? diphenhydrAMINE (BENADRYL) injection 12.5 mg  12.5 mg IntraVENous Q6H PRN   ??? nicotine (NICODERM CQ) 14 mg/24 hr patch 1 Patch  1 Patch TransDERmal Q24H   ??? insulin glargine (LANTUS) injection 8 Units  8 Units  SubCUTAneous DAILY   ??? glucose chewable tablet 16 g  4 Tab Oral PRN   ??? dextrose (D50W) injection Syrg 12.5-25 g  12.5-25 g IntraVENous PRN   ??? glucagon (GLUCAGEN) injection 1 mg  1 mg IntraMUSCular PRN        Review of Systems:  Pertinent items are noted in HPI.    Objective:  Vitals:    Filed Vitals:    05/29/12 1132 05/29/12 1610 05/29/12 2058 05/30/12 0306   BP: 150/73 153/80 159/79 166/85   Pulse: 90 88 89 81   Temp: 97.6 ??F (36.4 ??C) 98.2 ??F (36.8 ??C) 98.8 ??F (37.1 ??C) 98 ??F (36.7 ??C)   Resp: 18 16 16 16    Height:       Weight:       SpO2: 99% 96% 100% 100%     Intake and Output:     06/10 1900 - 06/12 0659  In: 1080 [P.O.:1080]  Out: -     Physical Examination:  General: Mildly distressed from pain   Neck:  Supple,  no mass  Resp:  Lungs CTA B/L, no wheezing , normal respiratory effort  CV:  RRR,  no murmur or rub,trace  LE edema  GI:  Soft, NT, + Bowel sounds, no hepatosplenomegaly  Neurologic:  Non focal  Psych:             AAO x 3 appropriate affect   Skin:  No Rash. Multiple acneform lesions on face       []     High complexity decision making was performed  []     Patient is at high-risk of decompensation with multiple organ involvement    Lab Data Personally Reviewed: I have reviewed all the pertinent labs, microbiology data and radiology studies during assessment.    Recent Labs   [facility] 05/29/12 0322    NA 141    K 4.1    CL 107    CO2 26    GLU 63*    BUN 19    CREA 4.76*    CA 7.5*    MG 1.5*    PHOS 3.7    ALB --    TBIL --    SGOT --    INR --     Recent Labs   Basename 05/29/12 0322    WBC 2.0*    HGB 7.2*    HCT 23.0*    PLT 158     Lab Results   Component Value Date/Time    Specimen Description: BLOOD 05/24/2012 10:47 AM     Lab Results   Component Value Date/Time    Culture result: NO GROWTH 5 DAYS 05/24/2012 10:47 AM     Recent Results (from the past 24 hour(s))   GLUCOSE, POC    Collection Time    05/29/12 11:30 AM       Component Value Range    POC GLUCOSE 126 (*) 75 - 110 mg/dL    Performed by  Patrecia Pace SON     TYPE & CROSSMATCH    Collection Time    05/29/12 11:33 AM       Component Value Range    Crossmatch Expiration 06/01/2012      ABO/Rh(D) B POS      Antibody screen NEG      Physician instructions SICKLEDEX NEGATIVE      ANTIGEN TYPING C POSITIVE, E NEGATIVE, KELL NEGATIVE,      Unit number [id number]      Blood component type RC LR AS5      Unit division 00      Status of unit ALLOCATED      ANTIGEN/ANTIBODY INF E NEGATIVE, KELL NEGATIVE,      Crossmatch result COMPATIBLE     GLUCOSE, POC    Collection Time    05/29/12  4:48 PM       Component Value Range    POC GLUCOSE 117 (*) 75 - 110 mg/dL    Performed by Hetty Ely     GLUCOSE, POC    Collection Time    05/30/12  7:23 AM       Component Value Range    POC GLUCOSE 84  75 - 110 mg/dL    Performed by Rod Holler         Total time spent with patient:  15  min.                               Care Plan discussed with:  Patient  x   Family      RN  x    Consulting Physician /Specialist        I have reviewed the flowsheets.  Chart reviewed. Pertinent Notes reviewed.   Current Medications list reviewed by me.  No change in PMH ,family and social history from Consult note.    Rhae Hammock, MD  05/30/2012    [facility]  [address],   [place], [place] [address]  Phone - [phone]   Fax: [phone]  [email or web address]

## 2012-05-30 NOTE — Progress Notes (Signed)
"  Bedside" shift change report given to Zee (oncoming nurse) by Johnna (offgoing nurse).  Report given with SBAR, Kardex, Intake/Output, MAR and Recent Results.

## 2012-05-30 NOTE — Progress Notes (Signed)
Bedside shift change report given to Annya, RN (oncoming nurse) by Jaidan Stachnik, RN (offgoing nurse).  Report given with SBAR.

## 2012-05-30 NOTE — Progress Notes (Signed)
Upon hourly rounds the pt was found on the bshsi computer. The nurse moved the computer into the hallway.

## 2012-05-30 NOTE — Progress Notes (Signed)
Pt is refusing to let the nurse draw morning labs stating "I am a hard stick, they will draw it in dialysis." The nurse offered to get an order for an ART stick, pt refused stating "no, I had one yesterday and it hurts. They will draw the blood in dialysis."

## 2012-05-30 NOTE — Progress Notes (Signed)
1130AM Pt 's BS registered 30, pt complained "feeling shaky". 12.5 mg of 25 D50% IV pushed.  BS repeated in registering 209, pt verbalized "feeling better body shakes resolving".

## 2012-05-30 NOTE — Progress Notes (Signed)
0347- pt arrived to unit via transport, no report received from dialysis nurse. Nurse in to assess pt.  1840- Mews score of 3, pt bp 210/93   1846-Blood sugar 27 ,immediately rechecked and blood sugar 39. Pt states he does feel shaky and weak. Pt alert and oriented x4. Dextrose given IV, also warmed up   1849-Nursing supervisor and hospitalist paged. Fellow nurse called PTU to speak with dialysis nurse, dialysis nurse, Gery Pray not on unit.  1850-IV Dextrose given  1851-Fellow nurse notified nursing supervisor of event.  1900-Spoke to Dr. Salena Saner regarding elevated BP and low blood sugar. Orders received for one time order of 10mg  IV hydralazine  1907-Blood sugar 122  1913-IV hydralazine given.  1930-Spoke to [employer] on PTU who contacted dialysis nurse and received report.

## 2012-05-30 NOTE — Progress Notes (Signed)
Bedside shift change report given to Braelin Costlow, Charity fundraiser (Cabin crew) by Christel Mormon, RN (offgoing nurse).  Report given with SBAR.

## 2012-05-30 NOTE — Progress Notes (Addendum)
Hospitalist Progress Note         Dionisio Paschal, MD   Romilda Joy [phone]  Call physician on-call through the operator 7pm-7am    Daily Progress Note: 05/30/2012    PCP- Phys Other, MD  Assessment/Plan:   1. LLB pneumonia, poa- On zosyn and Levaquin. Zosyn stopped per ID, cont levaquin, Follow Cx , no growth thus far, to complete levaquin course per ID  2. Sickle cell crisis, poa - Pt on dilaudid PCA, encourge po hydration, 02 as needed . Continue Hydrea.To follow closely and adjust as appropriate, added zofran for nausea,   3. ESRD on HD - HD as per nephrology   4. Anemia - Hb 7.5-7.9 , transfuse 1 unit 6/12 for hb on 7.2 with HD , hematology f/up , cont to monitor  5. Hyponatremia - monitor, stable    6. HTN- BP consistently higher, , pain contributing ? ,increased norvasc to 10 , still not better controlled , will reassess post HD  7. DM- resume lantus, ssi   8. Tobaco abuse - nicotine, counseled   9. Chronic pain - pain management as per hematology   10. DVT prophylaxis- heparin  11. Plan- Home on po abx soon once pain controlled       Subjective:     c/o SOB , pain still not undercontrol      Review of Systems:   A comprehensive review of systems was negative except for that written in the HPI.    Objective:   Physical Exam:     BP 183/85   Pulse 81   Temp 98 ??F (36.7 ??C)   Resp 16   Ht 5\' 5"  (1.651 m)   Wt 203 lb 4.2 oz   BMI 33.82 kg/m2   SpO2 100% O2 Flow Rate (L/min): 2 l/min O2 Device: Room air    Temp (24hrs), Avg:98.2 ??F (36.8 ??C), Min:97.6 ??F (36.4 ??C), Max:98.8 ??F (37.1 ??C)        06/10 1900 - 06/12 0659  In: 1080 [P.O.:1080]  Out: -     General: Well nourished, calm, appropriate, NAD  ambulatory  HEENT: PERRLA bilaterally, neck supple, no JVD, no thyromegaly, no lymphadenopathy   CVS: RRR, no murmurs, rubs or gallops   Lungs: diminished left base otherwise clear to auscultation, no wheezing, rales or rhonchi   GI: soft nontender, NABS. No rebound or guarding   EXT: 2+ pulses bilaterally,  trace edema, fistula right arm   Neuro: Cranial nerves intact, strength 5/5 in all 4 extremities, no gross focal neuro deficits, AAO X3   Skin: intact   Psych: normal affect    Data Review:       Recent Days:  Recent Labs   [facility] 05/29/12 0322    WBC 2.0*    HGB 7.2*    HCT 23.0*    PLT 158     Recent Labs   Basename 05/29/12 0322    NA 141    K 4.1    CL 107    CO2 26    GLU 63*    BUN 19    CREA 4.76*    CA 7.5*    MG 1.5*    PHOS 3.7    ALB --    TBIL --    SGOT --    INR --     No results found for this basename: PH:3,PCO2:3,PO2:3,HCO3:3,FIO2:3 in the last 72 hours    24 Hour Results:  Recent Results (from the past 24  hour(s))   GLUCOSE, POC    Collection Time    05/29/12 11:30 AM       Component Value Range    POC GLUCOSE 126 (*) 75 - 110 mg/dL    Performed by Patrecia Pace SON     TYPE & CROSSMATCH    Collection Time    05/29/12 11:33 AM       Component Value Range    Crossmatch Expiration 06/01/2012      ABO/Rh(D) B POS      Antibody screen NEG      Physician instructions SICKLEDEX NEGATIVE      ANTIGEN TYPING C POSITIVE, E NEGATIVE, KELL NEGATIVE,      Unit number [id number]      Blood component type RC LR AS5      Unit division 00      Status of unit ALLOCATED      ANTIGEN/ANTIBODY INF E NEGATIVE, KELL NEGATIVE,      Crossmatch result COMPATIBLE     GLUCOSE, POC    Collection Time    05/29/12  4:48 PM       Component Value Range    POC GLUCOSE 117 (*) 75 - 110 mg/dL    Performed by Hetty Ely     GLUCOSE, POC    Collection Time    2012/06/16  7:23 AM       Component Value Range    POC GLUCOSE 84  75 - 110 mg/dL    Performed by Rod Holler         Problem List:  Problem List as of 06/16/12  Never Reviewed      Codes Class Noted - Resolved    *Sickle cell crisis 282.62  05/24/2012 - Present              Medications reviewed  Current Facility-Administered Medications   Medication Dose Route Frequency   ??? 0.9% sodium chloride infusion  25 mL/hr IntraVENous CONTINUOUS   ??? acetaminophen (TYLENOL) tablet 650  mg  650 mg Oral Q4H PRN   ??? amLODIPine (NORVASC) tablet 10 mg  10 mg Oral DAILY   ??? metoclopramide HCl (REGLAN) injection 10 mg  10 mg IntraVENous Q6H PRN   ??? hydrALAZINE (APRESOLINE) tablet 10 mg  10 mg Oral Q6H PRN   ??? insulin lispro (HUMALOG) injection   SubCUTAneous TIDAC   ??? HYDROmorphone (PF) (DILAUDID) injection 2 mg  2 mg IntraVENous Q3H PRN   ??? HYDROmorphone (PF) 15 mg/30 ml (DILAUDID) PCA   IntraVENous CONTINUOUS   ??? levofloxacin (LEVAQUIN) tablet 500 mg  500 mg Oral Q48H   ??? hydroxyurea (HYDREA) chemo cap 500 mg  500 mg Oral Q MON, WED & FRI   ??? calcium acetate (PHOSLO) tablet 1,334 mg  2 Tab Oral TID WITH MEALS   ??? lisinopril (PRINIVIL, ZESTRIL) tablet 10 mg  10 mg Oral DAILY   ??? sodium chloride (NS) flush 5-10 mL  5-10 mL IntraVENous Q8H   ??? sodium chloride (NS) flush 5-10 mL  5-10 mL IntraVENous PRN   ??? heparin (porcine) injection 5,000 Units  5,000 Units SubCUTAneous Q8H   ??? 0.9% sodium chloride infusion  75 mL/hr IntraVENous CONTINUOUS   ??? B complex-vitaminC-folic acid (NEPHROCAP) cap  1 Cap Oral DAILY   ??? diphenhydrAMINE (BENADRYL) injection 12.5 mg  12.5 mg IntraVENous Q6H PRN   ??? nicotine (NICODERM CQ) 14 mg/24 hr patch 1 Patch  1 Patch TransDERmal Q24H   ??? insulin glargine (LANTUS) injection 8  Units  8 Units SubCUTAneous DAILY   ??? glucose chewable tablet 16 g  4 Tab Oral PRN   ??? dextrose (D50W) injection Syrg 12.5-25 g  12.5-25 g IntraVENous PRN   ??? glucagon (GLUCAGEN) injection 1 mg  1 mg IntraMUSCular PRN       Care Plan discussed with: Patient/Family and Nurse    Total time spent with patient: 30 minutes.    Leoda Smithhart Karren Cobble, MD

## 2012-05-30 NOTE — Progress Notes (Signed)
[facility]                         [phone]  Vitals Pre Post Assessment Pre Post   BP 193/94 180/99 LOC A and o x 3 A and o x 3   HR 89 89 Lungs coarse coarse   Temp 98.0 97.9 Cardiac r r r r r r   Resp 18 18 Skin Warm/dry Warm/dry   Weight 98.9 92.9 Edema +1/+2 gen +1/+2 gen      Pain denies pca     Orders   Duration: 4 hours Start: 1330 End: 1750 Total: 4   20 min   Dialyzer: revaclear Clotted dialyzer- unable to return blood   K Bath:  3    Ca Bath:  2    Na / Bicarb:  140/35    Target Fluid Removal:6000      Access   Type & Location: r ua avf- chlorhexadrine prep- 15/15 canulation- bleeding time  A=15 min   V=5 min   Comments:                                 Labs   Hep B status / date: Ab 62 01/24/12   Obtained/Reviewed  Critical Results Called reveiwed       Meds Given   Name Dose Route                    Total Liters Process:112.3    Net Fluid Removed: 6000       Comments   Time Out Done: Yes 1325 05/30/12   Primary Nurse Rpt Pre: Christiana Fuchs morris rn   Primary Nurse Rpt Post: christa morris rn   Pt Education: Fluid,fluid,fluid   Tx Summary: Tolerated treatment well

## 2012-05-30 NOTE — Progress Notes (Signed)
Verbal report received from [place], RN(name) on [employer].  Report consisted of patient???s Situation, Background, Assessment and   Recommendations(SBAR).   Information from the following report(s) SBAR, Kardex, Procedure Summary, Intake/Output, MAR and Recent Results was reviewed with the receiving nurse.  Opportunity for questions and clarification was provided.

## 2012-05-31 LAB — GLUCOSE, POC
Glucose (POC): 152 mg/dL — ABNORMAL HIGH (ref 75–110)
Glucose (POC): 139 mg/dL — ABNORMAL HIGH (ref 75–110)
Glucose (POC): 134 mg/dL — ABNORMAL HIGH (ref 75–110)
Glucose (POC): 136 mg/dL — ABNORMAL HIGH (ref 75–110)

## 2012-05-31 LAB — METABOLIC PANEL, BASIC
Anion gap: 5 mmol/L (ref 5–15)
BUN/Creatinine ratio: 5 — ABNORMAL LOW (ref 12–20)
BUN: 25 MG/DL — ABNORMAL HIGH (ref 6–20)
CO2: 30 MMOL/L (ref 21–32)
Calcium: 8.6 MG/DL (ref 8.5–10.1)
Chloride: 100 MMOL/L (ref 97–108)
Creatinine: 4.85 MG/DL — ABNORMAL HIGH (ref 0.45–1.15)
GFR est AA: 17 mL/min/{1.73_m2} — ABNORMAL LOW (ref >60)
GFR est non-AA: 14 mL/min/{1.73_m2} — ABNORMAL LOW (ref >60)
Glucose: 105 MG/DL — ABNORMAL HIGH (ref 65–100)
Potassium: 4.9 MMOL/L (ref 3.5–5.1)
Sodium: 135 MMOL/L — ABNORMAL LOW (ref 136–145)

## 2012-05-31 LAB — CBC WITH AUTOMATED DIFF
ABS. BASOPHILS: 0 10*3/uL (ref 0.0–0.1)
ABS. EOSINOPHILS: 0.1 10*3/uL (ref 0.0–0.4)
ABS. LYMPHOCYTES: 0.8 10*3/uL (ref 0.8–3.5)
ABS. MONOCYTES: 0.4 10*3/uL (ref 0.0–1.0)
ABS. NEUTROPHILS: 1.1 10*3/uL — ABNORMAL LOW (ref 1.8–8.0)
BASOPHILS: 0 % (ref 0–1)
EOSINOPHILS: 5 % (ref 0–7)
HCT: 25.3 % — ABNORMAL LOW (ref 36.6–50.3)
HGB: 7.9 g/dL — ABNORMAL LOW (ref 12.1–17.0)
LYMPHOCYTES: 32 % (ref 12–49)
MCH: 29.9 PG (ref 26.0–34.0)
MCHC: 31.2 g/dL (ref 30.0–36.5)
MCV: 95.8 FL (ref 80.0–99.0)
MONOCYTES: 18 % — ABNORMAL HIGH (ref 5–13)
NEUTROPHILS: 45 % (ref 32–75)
PLATELET: 157 10*3/uL (ref 150–400)
RBC: 2.64 M/uL — ABNORMAL LOW (ref 4.10–5.70)
RDW: 15.9 % — ABNORMAL HIGH (ref 11.5–14.5)
WBC: 2.4 10*3/uL — ABNORMAL LOW (ref 4.1–11.1)

## 2012-05-31 LAB — PHOSPHORUS: Phosphorus: 3.7 MG/DL (ref 2.5–4.9)

## 2012-05-31 LAB — MAGNESIUM: Magnesium: 1.7 MG/DL (ref 1.6–2.4)

## 2012-05-31 MED ADMIN — sodium chloride (NS) flush 5-10 mL: INTRAVENOUS | @ 02:00:00 | NDC 82903065462

## 2012-05-31 MED ADMIN — HYDROmorphone (PF) (DILAUDID) injection 2 mg: INTRAVENOUS | @ 15:00:00 | NDC 00409255201

## 2012-05-31 MED ADMIN — sodium chloride (NS) flush 5-10 mL: INTRAVENOUS | @ 21:00:00 | NDC 87701099893

## 2012-05-31 MED ADMIN — heparin (porcine) injection 5,000 Units: SUBCUTANEOUS | @ 10:00:00 | NDC 63323026201

## 2012-05-31 MED ADMIN — HYDROmorphone (PF) (DILAUDID) injection 2 mg: INTRAVENOUS | @ 21:00:00 | NDC 00409255201

## 2012-05-31 MED ADMIN — diphenhydrAMINE (BENADRYL) injection 12.5 mg: INTRAVENOUS | @ 02:00:00 | NDC 63323066401

## 2012-05-31 MED ADMIN — diphenhydrAMINE (BENADRYL) injection 12.5 mg: INTRAVENOUS | @ 17:00:00 | NDC 00641037621

## 2012-05-31 MED ADMIN — HYDROmorphone (PF) (DILAUDID) injection 2 mg: INTRAVENOUS | @ 02:00:00 | NDC 00409255201

## 2012-05-31 MED ADMIN — HYDROmorphone (PF) (DILAUDID) injection 2 mg: INTRAVENOUS | @ 08:00:00 | NDC 00409128331

## 2012-05-31 MED ADMIN — calcium acetate (PHOSLO) tablet 1,334 mg: ORAL | @ 21:00:00 | NDC 00574011302

## 2012-05-31 MED ADMIN — heparin (porcine) injection 5,000 Units: SUBCUTANEOUS | @ 02:00:00 | NDC 25021040201

## 2012-05-31 MED ADMIN — HYDROmorphone (PF) (DILAUDID) injection 2 mg: INTRAVENOUS | @ 17:00:00 | NDC 00409255201

## 2012-05-31 MED ADMIN — insulin glargine (LANTUS) injection 8 Units: SUBCUTANEOUS | @ 15:00:00 | NDC 09999222001

## 2012-05-31 MED ADMIN — heparin (porcine) injection 5,000 Units: SUBCUTANEOUS | @ 18:00:00 | NDC 25021040201

## 2012-05-31 MED ADMIN — HYDROmorphone (PF) 15 mg/30 ml (DILAUDID) PCA: INTRAVENOUS | @ 11:00:00 | NDC 02420030164

## 2012-05-31 MED ADMIN — lisinopril (PRINIVIL, ZESTRIL) tablet 20 mg: ORAL | @ 14:00:00 | NDC 68084006211

## 2012-05-31 MED ADMIN — amLODIPine (NORVASC) tablet 10 mg: ORAL | @ 14:00:00 | NDC 59762153006

## 2012-05-31 MED ADMIN — metoprolol (LOPRESSOR) tablet 12.5 mg: ORAL | @ 14:00:00 | NDC 62584026511

## 2012-05-31 MED ADMIN — sodium chloride (NS) flush 5-10 mL: INTRAVENOUS | @ 02:00:00 | NDC 87701099893

## 2012-05-31 MED ADMIN — calcium acetate (PHOSLO) tablet 1,334 mg: ORAL | @ 12:00:00 | NDC 00574011302

## 2012-05-31 MED ADMIN — diphenhydrAMINE (BENADRYL) injection 12.5 mg: INTRAVENOUS | @ 12:00:00 | NDC 00641037621

## 2012-05-31 MED ADMIN — diphenhydrAMINE (BENADRYL) injection 12.5 mg: INTRAVENOUS | @ 08:00:00 | NDC 00641037621

## 2012-05-31 MED ADMIN — calcium acetate (PHOSLO) tablet 1,334 mg: ORAL | @ 17:00:00 | NDC 00574011302

## 2012-05-31 MED ADMIN — HYDROmorphone (PF) (DILAUDID) injection 2 mg: INTRAVENOUS | @ 05:00:00 | NDC 00409255201

## 2012-05-31 MED ADMIN — metoprolol (LOPRESSOR) tablet 12.5 mg: ORAL | @ 23:00:00 | NDC 62584026511

## 2012-05-31 MED ADMIN — sodium chloride (NS) flush 5-10 mL: INTRAVENOUS | @ 18:00:00 | NDC 87701099893

## 2012-05-31 MED ADMIN — sodium chloride (NS) flush 5-10 mL: INTRAVENOUS | @ 10:00:00 | NDC 82903065462

## 2012-05-31 MED ADMIN — HYDROmorphone (PF) (DILAUDID) injection 2 mg: INTRAVENOUS | @ 12:00:00 | NDC 00409128331

## 2012-05-31 MED FILL — DIPHENHYDRAMINE HCL 50 MG/ML IJ SOLN: 50 mg/mL | INTRAMUSCULAR | Qty: 1

## 2012-05-31 MED FILL — METOPROLOL TARTRATE 25 MG TAB: 25 mg | ORAL | Qty: 1

## 2012-05-31 MED FILL — CALCIUM ACETATE 667 MG TAB: 667 mg | ORAL | Qty: 2

## 2012-05-31 MED FILL — INSULIN GLARGINE 100 UNIT/ML INJECTION: 100 unit/mL | SUBCUTANEOUS | Qty: 0.08

## 2012-05-31 MED FILL — HEPARIN (PORCINE) 5,000 UNIT/ML IJ SOLN: 5000 unit/mL | INTRAMUSCULAR | Qty: 1

## 2012-05-31 MED FILL — BD POSIFLUSH NORMAL SALINE 0.9 % INJECTION SYRINGE: INTRAMUSCULAR | Qty: 10

## 2012-05-31 MED FILL — NEPHROCAPS 1 MG CAPSULE: 1 mg | ORAL | Qty: 1

## 2012-05-31 MED FILL — HYDROMORPHONE (PF) 1 MG/ML IJ SOLN: 1 mg/mL | INTRAMUSCULAR | Qty: 1

## 2012-05-31 MED FILL — NICOTINE 14 MG/24 HR DAILY PATCH: 14 mg/24 hr | TRANSDERMAL | Qty: 1

## 2012-05-31 MED FILL — HYDROMORPHONE (PF) 1 MG/ML IJ SOLN: 1 mg/mL | INTRAMUSCULAR | Qty: 2

## 2012-05-31 MED FILL — AMLODIPINE 5 MG TAB: 5 mg | ORAL | Qty: 2

## 2012-05-31 MED FILL — LISINOPRIL 20 MG TAB: 20 mg | ORAL | Qty: 1

## 2012-05-31 MED FILL — BD POSIFLUSH NORMAL SALINE 0.9 % INJECTION SYRINGE: INTRAMUSCULAR | Qty: 30

## 2012-05-31 MED FILL — HYDROMORPHONE 15 MG/30 ML (0.5 MG/ML) IN 0.9% SOD.CHLORIDE INFUSION: 15 mg/30 mL (0.5 mg/mL) | INTRAVENOUS | Qty: 30

## 2012-05-31 NOTE — Progress Notes (Signed)
Nephrology Progress Note    Date of Admission : 05/24/2012    CC:  Follow up for ESRD       Assessment and Plan   ESRD : HD tomorrow. Too much UF yesterday resulted in clotting. Will talk to [facility] !!  Anemia : SC D + ESRD. For transfusion  -one unit today . Effectively he got only 1/2 unit yesterday due to clotting at dialysis and blood loss  HTN : uncontrolled. Added B blocker   SCC: still reports back and leg pains   LLL PNA : on Abx     For other plans, see orders.    Interval History:  HD yesterday - 6kg removed   Blood loss at HD - 130-160 cc   Feels no better today     Current Facility-Administered Medications   Medication Dose Route Frequency   ??? hydrALAZINE (APRESOLINE) 20 mg/mL injection 10 mg  10 mg IntraVENous ONCE   ??? 0.9% sodium chloride infusion  25 mL/hr IntraVENous CONTINUOUS   ??? acetaminophen (TYLENOL) tablet 650 mg  650 mg Oral Q4H PRN   ??? amLODIPine (NORVASC) tablet 10 mg  10 mg Oral DAILY   ??? metoclopramide HCl (REGLAN) injection 10 mg  10 mg IntraVENous Q6H PRN   ??? hydrALAZINE (APRESOLINE) tablet 10 mg  10 mg Oral Q6H PRN   ??? insulin lispro (HUMALOG) injection   SubCUTAneous TIDAC   ??? HYDROmorphone (PF) (DILAUDID) injection 2 mg  2 mg IntraVENous Q3H PRN   ??? HYDROmorphone (PF) 15 mg/30 ml (DILAUDID) PCA   IntraVENous CONTINUOUS   ??? levofloxacin (LEVAQUIN) tablet 500 mg  500 mg Oral Q48H   ??? hydroxyurea (HYDREA) chemo cap 500 mg  500 mg Oral Q MON, WED & FRI   ??? calcium acetate (PHOSLO) tablet 1,334 mg  2 Tab Oral TID WITH MEALS   ??? lisinopril (PRINIVIL, ZESTRIL) tablet 10 mg  10 mg Oral DAILY   ??? sodium chloride (NS) flush 5-10 mL  5-10 mL IntraVENous Q8H   ??? sodium chloride (NS) flush 5-10 mL  5-10 mL IntraVENous PRN   ??? heparin (porcine) injection 5,000 Units  5,000 Units SubCUTAneous Q8H   ??? 0.9% sodium chloride infusion  75 mL/hr IntraVENous CONTINUOUS   ??? B complex-vitaminC-folic acid (NEPHROCAP) cap  1 Cap Oral DAILY   ??? diphenhydrAMINE (BENADRYL) injection 12.5 mg  12.5 mg IntraVENous  Q6H PRN   ??? nicotine (NICODERM CQ) 14 mg/24 hr patch 1 Patch  1 Patch TransDERmal Q24H   ??? insulin glargine (LANTUS) injection 8 Units  8 Units SubCUTAneous DAILY   ??? glucose chewable tablet 16 g  4 Tab Oral PRN   ??? dextrose (D50W) injection Syrg 12.5-25 g  12.5-25 g IntraVENous PRN   ??? glucagon (GLUCAGEN) injection 1 mg  1 mg IntraMUSCular PRN        Review of Systems:  Pertinent items are noted in HPI.    Objective:  Vitals:    Filed Vitals:    05/30/12 1940 05/30/12 2106 05/30/12 2355 05/31/12 0341   BP: 176/83 151/72 145/87 162/86   Pulse: 111 98 106 102   Temp:  98.4 ??F (36.9 ??C) 99.2 ??F (37.3 ??C) 97.3 ??F (36.3 ??C)   Resp:  16 16 16    Height:       Weight:       SpO2:  98% 96% 98%     Intake and Output:     06/11 1900 - 06/13 0659  In: 1230 [P.O.:880]  Out: 30865     Physical Examination:  General: Mildly distressed from pain   Neck:  Supple, no mass  Resp:  Lungs CTA B/L, no wheezing , normal respiratory effort  CV:  RRR,  no murmur or rub,trace  LE edema  GI:  Soft, NT, + Bowel sounds, no hepatosplenomegaly  Neurologic:  Non focal  Psych:             AAO x 3 appropriate affect   Skin:  No Rash. Multiple acneform lesions on face       []     High complexity decision making was performed  []     Patient is at high-risk of decompensation with multiple organ involvement    Lab Data Personally Reviewed: I have reviewed all the pertinent labs, microbiology data and radiology studies during assessment.    Recent Labs   [facility] 05/31/12 0355 05/29/12 0322    NA 135* 141    K 4.9 4.1    CL 100 107    CO2 30 26    GLU 105* 63*    BUN 25* 19    CREA 4.85* 4.76*    CA 8.6 7.5*    MG 1.7 1.5*    PHOS 3.7 3.7    ALB -- --    TBIL -- --    SGOT -- --    INR -- --     Recent Labs   Basename 05/31/12 0355 05/29/12 0322    WBC 2.4* 2.0*    HGB 7.9* 7.2*    HCT 25.3* 23.0*    PLT 157 158     Lab Results   Component Value Date/Time    Specimen Description: BLOOD 05/24/2012 10:47 AM     Lab Results   Component Value Date/Time     Culture result: NO GROWTH 5 DAYS 05/24/2012 10:47 AM     Recent Results (from the past 24 hour(s))   GLUCOSE, POC    Collection Time    05/30/12 11:22 AM       Component Value Range    POC GLUCOSE 34 (*) 75 - 110 mg/dL    Performed by Rod Holler     GLUCOSE, POC    Collection Time    05/30/12 11:26 AM       Component Value Range    POC GLUCOSE 30 (*) 75 - 110 mg/dL    Performed by Rod Holler     GLUCOSE, POC    Collection Time    05/30/12 12:07 PM       Component Value Range    POC GLUCOSE 209 (*) 75 - 110 mg/dL    Performed by Rod Holler     GLUCOSE, POC    Collection Time    05/30/12  6:46 PM       Component Value Range    POC GLUCOSE 27 (*) 75 - 110 mg/dL    Performed by SHARPE CARINA      GLUCOSE, POC    Collection Time    05/30/12  6:50 PM       Component Value Range    POC GLUCOSE 39 (*) 75 - 110 mg/dL    Performed by SHARPE CARINA      GLUCOSE, POC    Collection Time    05/30/12  7:07 PM       Component Value Range    POC GLUCOSE 122 (*) 75 - 110 mg/dL    Performed by [facility] CARINA  GLUCOSE, POC    Collection Time    05/30/12 10:02 PM       Component Value Range    POC GLUCOSE 152 (*) 75 - 110 mg/dL    Performed by Arbie Cookey     CBC WITH AUTOMATED DIFF    Collection Time    05/31/12  3:55 AM       Component Value Range    WBC 2.4 (*) 4.1 - 11.1 K/uL    RBC 2.64 (*) 4.10 - 5.70 M/uL    HGB 7.9 (*) 12.1 - 17.0 g/dL    HCT 29.5 (*) 62.1 - 50.3 %    MCV 95.8  80.0 - 99.0 FL    MCH 29.9  26.0 - 34.0 PG    MCHC 31.2  30.0 - 36.5 g/dL    RDW 30.8 (*) 65.7 - 14.5 %    PLATELET 157  150 - 400 K/uL    NEUTROPHILS 45  32 - 75 %    LYMPHOCYTES 32  12 - 49 %    MONOCYTES 18 (*) 5 - 13 %    EOSINOPHILS 5  0 - 7 %    BASOPHILS 0  0 - 1 %    ABS. NEUTROPHILS 1.1 (*) 1.8 - 8.0 K/UL    ABS. LYMPHOCYTES 0.8  0.8 - 3.5 K/UL    ABS. MONOCYTES 0.4  0.0 - 1.0 K/UL    ABS. EOSINOPHILS 0.1  0.0 - 0.4 K/UL    ABS. BASOPHILS 0.0  0.0 - 0.1 K/UL   METABOLIC PANEL, BASIC    Collection Time    05/31/12  3:55 AM       Component  Value Range    Sodium 135 (*) 136 - 145 MMOL/L    Potassium 4.9  3.5 - 5.1 MMOL/L    Chloride 100  97 - 108 MMOL/L    CO2 30  21 - 32 MMOL/L    Anion gap 5  5 - 15 mmol/L    Glucose 105 (*) 65 - 100 MG/DL    BUN 25 (*) 6 - 20 MG/DL    Creatinine 8.46 (*) 0.45 - 1.15 MG/DL    BUN/Creatinine ratio 5 (*) 12 - 20      GFR est AA 17 (*) >60 ml/min/1.42m2    GFR est non-AA 14 (*) >60 ml/min/1.29m2    Calcium 8.6  8.5 - 10.1 MG/DL   MAGNESIUM    Collection Time    05/31/12  3:55 AM       Component Value Range    Magnesium 1.7  1.6 - 2.4 MG/DL   PHOSPHORUS    Collection Time    05/31/12  3:55 AM       Component Value Range    Phosphorus 3.7  2.5 - 4.9 MG/DL   GLUCOSE, POC    Collection Time    05/31/12  6:16 AM       Component Value Range    POC GLUCOSE 139 (*) 75 - 110 mg/dL    Performed by Rod Holler         Total time spent with patient:  15  min.                               Care Plan discussed with:  Patient  x   Family      RN  x    Consulting Physician Doreatha Lew  I have reviewed the flowsheets.  Chart reviewed. Pertinent Notes reviewed.   Current Medications list reviewed by me.  No change in PMH ,family and social history from Consult note.    Rhae Hammock, MD  05/31/2012    [facility]  [address],   [place], [place] [address]  Phone - [phone]   Fax: [phone]  [email or web address]

## 2012-05-31 NOTE — Progress Notes (Signed)
Spoke with Mark-nurse manger from PTU about yesterday's low blood glucose and elevated BP after dialysis.  Spoke with Marlou Starks on 5W about incidence and that Loraine Leriche is following up with [facility].

## 2012-05-31 NOTE — Progress Notes (Addendum)
Hospitalist Progress Note         Dionisio Paschal, MD   Romilda Joy [phone]  Call physician on-call through the operator 7pm-7am    Daily Progress Note: 05/31/2012    PCP- Phys Other, MD  Assessment/Plan:   1. LLB pneumonia, poa- On zosyn and Levaquin. Zosyn stopped per ID, cont levaquin, Follow Cx , no growth thus far, to complete levaquin course per ID  2. Sickle cell crisis, poa - Pt on dilaudid PCA, encourge po hydration, 02 as needed . Continue Hydrea.To follow closely and adjust as appropriate, added zofran for nausea,   3. ESRD on HD - HD as per nephrology   4. Anemia - Hb 7.5-7.9 , transfuse 1 unit 6/12 for hb on 7.2 with HD  But clotted, hematology f/up , cont to monitor, transfuse unit 1 , monitor hb   5. Hyponatremia - monitor, stable    6. HTN- BP consistently higher, , pain contributing ? ,increased norvasc to 10 , still not better controlled , started on metoprolol 12.5 mg 6/13, will cont to monitor ,   - had BP in 200s yest post dialysis ,says he was really terrified after clotting   7. DM- resume lantus, ssi  , had hypoglycemia 6/12 as pt hadn't eaten due to HD  8. Tobaco abuse - nicotine, counseled   9. Chronic pain - pain management as per hematology   10. DVT prophylaxis- heparin  11. Plan- Home on po abx soon once pain controlled       Subjective:   , pain still not undercontrol, but says feeling a lot better     Review of Systems:   A comprehensive review of systems was negative except for that written in the HPI.    Objective:   Physical Exam:     BP 146/76   Pulse 82   Temp 98.2 ??F (36.8 ??C)   Resp 16   Ht 5\' 5"  (1.651 m)   Wt 203 lb 4.2 oz   BMI 33.82 kg/m2   SpO2 100% O2 Flow Rate (L/min): 2 l/min O2 Device: Room air    Temp (24hrs), Avg:98.1 ??F (36.7 ??C), Min:97.3 ??F (36.3 ??C), Max:99.2 ??F (37.3 ??C)        06/11 1900 - 06/13 0659  In: 1230 [P.O.:880]  Out: 09811     General: Well nourished, calm, appropriate, NAD  ambulatory  HEENT: PERRLA bilaterally, neck supple, no JVD, no  thyromegaly, no lymphadenopathy   CVS: RRR, no murmurs, rubs or gallops   Lungs: diminished left base otherwise clear to auscultation, no wheezing, rales or rhonchi   GI: soft nontender, NABS. No rebound or guarding   EXT: 2+ pulses bilaterally, trace edema, fistula right arm   Neuro: Cranial nerves intact, strength 5/5 in all 4 extremities, no gross focal neuro deficits, AAO X3   Skin: intact   Psych: normal affect    Data Review:       Recent Days:  Recent Labs   [facility] 05/31/12 0355 05/29/12 0322    WBC 2.4* 2.0*    HGB 7.9* 7.2*    HCT 25.3* 23.0*    PLT 157 158     Recent Labs   Basename 05/31/12 0355 05/29/12 0322    NA 135* 141    K 4.9 4.1    CL 100 107    CO2 30 26    GLU 105* 63*    BUN 25* 19    CREA 4.85* 4.76*  CA 8.6 7.5*    MG 1.7 1.5*    PHOS 3.7 3.7    ALB -- --    TBIL -- --    SGOT -- --    INR -- --     No results found for this basename: PH:3,PCO2:3,PO2:3,HCO3:3,FIO2:3 in the last 72 hours    24 Hour Results:  Recent Results (from the past 24 hour(s))   GLUCOSE, POC    Collection Time    05/30/12 11:22 AM       Component Value Range    POC GLUCOSE 34 (*) 75 - 110 mg/dL    Performed by Rod Holler     GLUCOSE, POC    Collection Time    05/30/12 11:26 AM       Component Value Range    POC GLUCOSE 30 (*) 75 - 110 mg/dL    Performed by Rod Holler     GLUCOSE, POC    Collection Time    05/30/12 12:07 PM       Component Value Range    POC GLUCOSE 209 (*) 75 - 110 mg/dL    Performed by Rod Holler     GLUCOSE, POC    Collection Time    05/30/12  6:46 PM       Component Value Range    POC GLUCOSE 27 (*) 75 - 110 mg/dL    Performed by SHARPE CARINA      GLUCOSE, POC    Collection Time    05/30/12  6:50 PM       Component Value Range    POC GLUCOSE 39 (*) 75 - 110 mg/dL    Performed by SHARPE CARINA      GLUCOSE, POC    Collection Time    05/30/12  7:07 PM       Component Value Range    POC GLUCOSE 122 (*) 75 - 110 mg/dL    Performed by [facility] CARINA      GLUCOSE, POC    Collection Time    05/30/12  10:02 PM       Component Value Range    POC GLUCOSE 152 (*) 75 - 110 mg/dL    Performed by Arbie Cookey     CBC WITH AUTOMATED DIFF    Collection Time    05/31/12  3:55 AM       Component Value Range    WBC 2.4 (*) 4.1 - 11.1 K/uL    RBC 2.64 (*) 4.10 - 5.70 M/uL    HGB 7.9 (*) 12.1 - 17.0 g/dL    HCT 78.2 (*) 95.6 - 50.3 %    MCV 95.8  80.0 - 99.0 FL    MCH 29.9  26.0 - 34.0 PG    MCHC 31.2  30.0 - 36.5 g/dL    RDW 21.3 (*) 08.6 - 14.5 %    PLATELET 157  150 - 400 K/uL    NEUTROPHILS 45  32 - 75 %    LYMPHOCYTES 32  12 - 49 %    MONOCYTES 18 (*) 5 - 13 %    EOSINOPHILS 5  0 - 7 %    BASOPHILS 0  0 - 1 %    ABS. NEUTROPHILS 1.1 (*) 1.8 - 8.0 K/UL    ABS. LYMPHOCYTES 0.8  0.8 - 3.5 K/UL    ABS. MONOCYTES 0.4  0.0 - 1.0 K/UL    ABS. EOSINOPHILS 0.1  0.0 - 0.4 K/UL    ABS.  BASOPHILS 0.0  0.0 - 0.1 K/UL   METABOLIC PANEL, BASIC    Collection Time    06/14/12  3:55 AM       Component Value Range    Sodium 135 (*) 136 - 145 MMOL/L    Potassium 4.9  3.5 - 5.1 MMOL/L    Chloride 100  97 - 108 MMOL/L    CO2 30  21 - 32 MMOL/L    Anion gap 5  5 - 15 mmol/L    Glucose 105 (*) 65 - 100 MG/DL    BUN 25 (*) 6 - 20 MG/DL    Creatinine 1.61 (*) 0.45 - 1.15 MG/DL    BUN/Creatinine ratio 5 (*) 12 - 20      GFR est AA 17 (*) >60 ml/min/1.31m2    GFR est non-AA 14 (*) >60 ml/min/1.67m2    Calcium 8.6  8.5 - 10.1 MG/DL   MAGNESIUM    Collection Time    June 14, 2012  3:55 AM       Component Value Range    Magnesium 1.7  1.6 - 2.4 MG/DL   PHOSPHORUS    Collection Time    2012/06/14  3:55 AM       Component Value Range    Phosphorus 3.7  2.5 - 4.9 MG/DL   GLUCOSE, POC    Collection Time    June 14, 2012  6:16 AM       Component Value Range    POC GLUCOSE 139 (*) 75 - 110 mg/dL    Performed by Rod Holler         Problem List:  Problem List as of 2012/06/14  Never Reviewed      Codes Class Noted - Resolved    *Sickle cell crisis 282.62  05/24/2012 - Present              Medications reviewed  Current Facility-Administered Medications   Medication Dose Route  Frequency   ??? lisinopril (PRINIVIL, ZESTRIL) tablet 20 mg  20 mg Oral DAILY   ??? metoprolol (LOPRESSOR) tablet 12.5 mg  12.5 mg Oral BID   ??? hydrALAZINE (APRESOLINE) 20 mg/mL injection 10 mg  10 mg IntraVENous ONCE   ??? 0.9% sodium chloride infusion  25 mL/hr IntraVENous CONTINUOUS   ??? acetaminophen (TYLENOL) tablet 650 mg  650 mg Oral Q4H PRN   ??? amLODIPine (NORVASC) tablet 10 mg  10 mg Oral DAILY   ??? metoclopramide HCl (REGLAN) injection 10 mg  10 mg IntraVENous Q6H PRN   ??? hydrALAZINE (APRESOLINE) tablet 10 mg  10 mg Oral Q6H PRN   ??? insulin lispro (HUMALOG) injection   SubCUTAneous TIDAC   ??? HYDROmorphone (PF) (DILAUDID) injection 2 mg  2 mg IntraVENous Q3H PRN   ??? HYDROmorphone (PF) 15 mg/30 ml (DILAUDID) PCA   IntraVENous CONTINUOUS   ??? calcium acetate (PHOSLO) tablet 1,334 mg  2 Tab Oral TID WITH MEALS   ??? sodium chloride (NS) flush 5-10 mL  5-10 mL IntraVENous Q8H   ??? sodium chloride (NS) flush 5-10 mL  5-10 mL IntraVENous PRN   ??? heparin (porcine) injection 5,000 Units  5,000 Units SubCUTAneous Q8H   ??? 0.9% sodium chloride infusion  75 mL/hr IntraVENous CONTINUOUS   ??? B complex-vitaminC-folic acid (NEPHROCAP) cap  1 Cap Oral DAILY   ??? diphenhydrAMINE (BENADRYL) injection 12.5 mg  12.5 mg IntraVENous Q6H PRN   ??? nicotine (NICODERM CQ) 14 mg/24 hr patch 1 Patch  1 Patch TransDERmal Q24H   ??? insulin  glargine (LANTUS) injection 8 Units  8 Units SubCUTAneous DAILY   ??? glucose chewable tablet 16 g  4 Tab Oral PRN   ??? dextrose (D50W) injection Syrg 12.5-25 g  12.5-25 g IntraVENous PRN   ??? glucagon (GLUCAGEN) injection 1 mg  1 mg IntraMUSCular PRN       Care Plan discussed with: Patient/Family and Nurse    Total time spent with patient: 30 minutes.    Savayah Waltrip Karren Cobble, MD

## 2012-05-31 NOTE — Progress Notes (Signed)
Hematology-Oncology Progress Note    Tyler Ballard  1981/07/25  [id number]  05/31/2012    Follow-up for: sickle cell anemia     [x]         Chart notes since last visit reviewed   [x]         Medications reviewed for allergies and interactions       Case discussed with the following:         []         Case Manager                    []         Nursing Staff                                                                         []         Pathologist                                                                        []         FAMILY      Subjective:     Spoke with patient who complains of: pt.  Still has some back and leg pain, dialysis was interrupted half way through yesterday, only received 1/2 unit of blood,     Objective:     Patient Vitals for the past 24 hrs:   BP Temp Pulse Resp SpO2   05/31/12 0932 146/76 mmHg 98.2 ??F (36.8 ??C) 82  16  100 %   05/31/12 0341 162/86 mmHg 97.3 ??F (36.3 ??C) 102  16  98 %   05/30/12 2355 145/87 mmHg 99.2 ??F (37.3 ??C) 106  16  96 %   05/30/12 2106 151/72 mmHg 98.4 ??F (36.9 ??C) 98  16  98 %   05/30/12 1940 176/83 mmHg - 111  - -   05/30/12 1936 181/87 mmHg - 105  - -   05/30/12 1930 180/89 mmHg - 107  - -   05/30/12 1925 152/88 mmHg - 103  - -   05/30/12 1920 151/82 mmHg - 100  - -   05/30/12 1840 210/93 mmHg 97.8 ??F (36.6 ??C) 93  18  93 %   05/30/12 1810 180/99 mmHg - 89  - -   05/30/12 1741 194/101 mmHg - 91  - -   05/30/12 1727 166/89 mmHg - 88  - -   05/30/12 1633 142/90 mmHg - 90  - -   05/30/12 1624 188/95 mmHg 97.7 ??F (36.5 ??C) 86  18  -   05/30/12 1619 182/95 mmHg - 86  - -   05/30/12 1615 - 98.2 ??F (36.8 ??C) - - -   05/30/12 1607 174/92 mmHg - 87  - -   05/30/12 1541 185/92 mmHg - 87  - -   05/30/12 1502 182/88 mmHg - 89  - -   05/30/12 1429 155/86 mmHg - 84  - -   05/30/12  1403 171/78 mmHg - 89  - -   05/30/12 1344 196/89 mmHg - 87  - -   05/30/12 1005 183/85 mmHg - - - -   05/30/12 1003 183/85 mmHg 98.4 ??F (36.9 ??C) 81  18  99 %       REVIEW OF SYSTEMS:     Constitutional: negative fever, negative chills, negative weight loss  Eyes:   negative visual changes  ENT:   negative sore throat, tongue or lip swelling  Respiratory:  negative cough, negative dyspnea  Cards:  negative for chest pain, palpitations, lower extremity edema  GI:   negative for nausea, vomiting, diarrhea, and abdominal pain  Neuro:  negative for headaches, dizziness, vertigo  [x]                         Full ROS o/w normal/non contributor    Constitutional:  Patient looks  []         Sick  []         Frail  [x]         Better                                                 []         Depressed    HEENT:  [x]    NC                         []    AT               []     ALOPECIA           Eyes: [x]    Normal               []     Icteric  Oropharynx: [x]     Normal                  []   Thrush               []    Dry  Mucositis: [x]     None                 Grade: []         I  []         II  []         III  []         IV  Neck:   [x]    Supple                  []   Rigid               JVD:    [x]    ABSENT       []    PRESENT  Lymphadenopathy:   []    None Noted            []    PRESENT    Chest:  [x]    Clear               []     Rhonchi                      Dec'd @     []   Right Base           []   Left Base    CV:             [x]    Regular              []   Irregular               []    Tachy                []    Murmur  Abdominal:   [x]     Soft              []    NON-tender               []    Tender      BS:    [x]    ABSENT                   []    PRESENT  Liver:     [x]   NON-palp                  []    EDGE- palp  Spleen: [x]    NON-palp                   []   EDGE - palp  Mass:   [x]    ABSENT                          []   PRESENT  Extr:    []   Lymphedema             []    Cyanosis      []   Clubbing  Edema:     []    NONE       []    PRESENT  Skin:  Intact [x]            Purpura []         Rash: [x]    ABSENT       []   PRESENT  Neuro:  [x]         Normal  []         Confused      Available labs reviewed:  Labs:    Recent Results (from the  past 24 hour(s))   GLUCOSE, POC    Collection Time    05/30/12 11:22 AM       Component Value Range    POC GLUCOSE 34 (*) 75 - 110 mg/dL    Performed by SCHOOLS Jessie     GLUCOSE, POC    Collection Time    05/30/12 11:26 AM       Component Value Range    POC GLUCOSE 30 (*) 75 - 110 mg/dL    Performed by Rod Holler     GLUCOSE, POC    Collection Time    05/30/12 12:07 PM       Component Value Range    POC GLUCOSE 209 (*) 75 - 110 mg/dL    Performed by Rod Holler     GLUCOSE, POC    Collection Time    05/30/12  6:46 PM       Component Value Range    POC GLUCOSE 27 (*) 75 - 110 mg/dL    Performed by SHARPE CARINA      GLUCOSE, POC    Collection Time    05/30/12  6:50 PM       Component Value Range    POC GLUCOSE 39 (*) 75 - 110 mg/dL  Performed by [facility] CARINA      GLUCOSE, POC    Collection Time    05/30/12  7:07 PM       Component Value Range    POC GLUCOSE 122 (*) 75 - 110 mg/dL    Performed by [facility] CARINA      GLUCOSE, POC    Collection Time    05/30/12 10:02 PM       Component Value Range    POC GLUCOSE 152 (*) 75 - 110 mg/dL    Performed by Arbie Cookey     CBC WITH AUTOMATED DIFF    Collection Time    05/31/12  3:55 AM       Component Value Range    WBC 2.4 (*) 4.1 - 11.1 K/uL    RBC 2.64 (*) 4.10 - 5.70 M/uL    HGB 7.9 (*) 12.1 - 17.0 g/dL    HCT 16.1 (*) 09.6 - 50.3 %    MCV 95.8  80.0 - 99.0 FL    MCH 29.9  26.0 - 34.0 PG    MCHC 31.2  30.0 - 36.5 g/dL    RDW 04.5 (*) 40.9 - 14.5 %    PLATELET 157  150 - 400 K/uL    NEUTROPHILS 45  32 - 75 %    LYMPHOCYTES 32  12 - 49 %    MONOCYTES 18 (*) 5 - 13 %    EOSINOPHILS 5  0 - 7 %    BASOPHILS 0  0 - 1 %    ABS. NEUTROPHILS 1.1 (*) 1.8 - 8.0 K/UL    ABS. LYMPHOCYTES 0.8  0.8 - 3.5 K/UL    ABS. MONOCYTES 0.4  0.0 - 1.0 K/UL    ABS. EOSINOPHILS 0.1  0.0 - 0.4 K/UL    ABS. BASOPHILS 0.0  0.0 - 0.1 K/UL   METABOLIC PANEL, BASIC    Collection Time    05/31/12  3:55 AM       Component Value Range    Sodium 135 (*) 136 - 145 MMOL/L    Potassium 4.9  3.5 - 5.1 MMOL/L     Chloride 100  97 - 108 MMOL/L    CO2 30  21 - 32 MMOL/L    Anion gap 5  5 - 15 mmol/L    Glucose 105 (*) 65 - 100 MG/DL    BUN 25 (*) 6 - 20 MG/DL    Creatinine 8.11 (*) 0.45 - 1.15 MG/DL    BUN/Creatinine ratio 5 (*) 12 - 20      GFR est AA 17 (*) >60 ml/min/1.80m2    GFR est non-AA 14 (*) >60 ml/min/1.35m2    Calcium 8.6  8.5 - 10.1 MG/DL   MAGNESIUM    Collection Time    05/31/12  3:55 AM       Component Value Range    Magnesium 1.7  1.6 - 2.4 MG/DL   PHOSPHORUS    Collection Time    05/31/12  3:55 AM       Component Value Range    Phosphorus 3.7  2.5 - 4.9 MG/DL   GLUCOSE, POC    Collection Time    05/31/12  6:16 AM       Component Value Range    POC GLUCOSE 139 (*) 75 - 110 mg/dL    Performed by Rod Holler         Available Xrays reviewed:    Chemotherapy monitored and toxicities assessed:  Assessment and Plan   1. Sickle cell pain crisis... Probably precipitated by pneumonia...Marland Kitchenhe is still requiring IV pain meds, he looks much more  Comfortable but doesn't want me to reduce his pca setting yet. Pt. Says his crises usually last 3-4 days, is followed at [facility] in the fellow's clinic  2. Anemia..hgb 7.9 today Secondary to #1 +/_ esrd... Will transfuse one unit on dialysis today  3. Pneumonia... Cultures negative so far, will continue antibiotics, will d/c hydrea which is suppressing his wbc  4. ESRD... For dialysis today  Harlen Labs, MD

## 2012-06-01 LAB — GLUCOSE, POC
Glucose (POC): 86 mg/dL (ref 75–110)
Glucose (POC): 105 mg/dL (ref 75–110)
Glucose (POC): 67 mg/dL — ABNORMAL LOW (ref 75–110)
Glucose (POC): 171 mg/dL — ABNORMAL HIGH (ref 75–110)
Glucose (POC): 62 mg/dL — ABNORMAL LOW (ref 75–110)
Glucose (POC): 69 mg/dL — ABNORMAL LOW (ref 75–110)
Glucose (POC): 166 mg/dL — ABNORMAL HIGH (ref 75–110)
Glucose (POC): 108 mg/dL (ref 75–110)

## 2012-06-01 LAB — TYPE + CROSSMATCH
ABO/Rh(D): B POS
ANTIGEN/ANTIBODY INFO: NEGATIVE
ANTIGEN/ANTIBODY INFO: NEGATIVE
Antibody screen: NEGATIVE
Physician instructions: NEGATIVE
Status of unit: TRANSFUSED
Status of unit: TRANSFUSED
Unit division: 0
Unit division: 0

## 2012-06-01 MED ADMIN — sodium chloride (NS) 0.9 % flush: @ 04:00:00 | NDC 87701099893

## 2012-06-01 MED ADMIN — HYDROmorphone (PF) (DILAUDID) injection 2 mg: INTRAVENOUS | @ 04:00:00 | NDC 00409255201

## 2012-06-01 MED ADMIN — sodium chloride (NS) 0.9 % flush: INTRAVENOUS | @ 10:00:00 | NDC 87701099893

## 2012-06-01 MED ADMIN — sodium chloride (NS) 0.9 % flush: @ 07:00:00 | NDC 87701099893

## 2012-06-01 MED ADMIN — HYDROmorphone (PF) (DILAUDID) injection 2 mg: INTRAVENOUS | @ 10:00:00 | NDC 00409255201

## 2012-06-01 MED ADMIN — HYDROmorphone (DILAUDID) injection 2 mg: INTRAVENOUS | @ 17:00:00 | NDC 00641012121

## 2012-06-01 MED ADMIN — sodium chloride (NS) flush 5-10 mL: INTRAVENOUS | @ 02:00:00 | NDC 87701099893

## 2012-06-01 MED ADMIN — metoprolol (LOPRESSOR) tablet 25 mg: ORAL | @ 23:00:00 | NDC 62584026511

## 2012-06-01 MED ADMIN — sodium chloride (NS) 0.9 % flush: NDC 87701099893

## 2012-06-01 MED ADMIN — diphenhydrAMINE (BENADRYL) injection 12.5 mg: INTRAVENOUS | @ 13:00:00 | NDC 63323066401

## 2012-06-01 MED ADMIN — diphenhydrAMINE (BENADRYL) injection 12.5 mg: INTRAVENOUS | @ 20:00:00 | NDC 00641037621

## 2012-06-01 MED ADMIN — heparin (porcine) injection 5,000 Units: SUBCUTANEOUS | @ 18:00:00 | NDC 25021040201

## 2012-06-01 MED ADMIN — lisinopril (PRINIVIL, ZESTRIL) tablet 20 mg: ORAL | @ 18:00:00 | NDC 68084006211

## 2012-06-01 MED ADMIN — amLODIPine (NORVASC) tablet 10 mg: ORAL | @ 18:00:00 | NDC 59762153006

## 2012-06-01 MED ADMIN — diphenhydrAMINE (BENADRYL) injection 12.5 mg: INTRAVENOUS | @ 07:00:00 | NDC 00641037621

## 2012-06-01 MED ADMIN — HYDROmorphone (PF) (DILAUDID) injection 2 mg: INTRAVENOUS | @ 14:00:00 | NDC 00409255201

## 2012-06-01 MED ADMIN — HYDROmorphone (PF) (DILAUDID) injection 2 mg: INTRAVENOUS | NDC 00409255201

## 2012-06-01 MED ADMIN — calcium acetate (PHOSLO) tablet 1,334 mg: ORAL | @ 18:00:00 | NDC 00574011302

## 2012-06-01 MED ADMIN — diphenhydrAMINE (BENADRYL) injection 12.5 mg: INTRAVENOUS | NDC 00641037621

## 2012-06-01 MED ADMIN — heparin (porcine) injection 5,000 Units: SUBCUTANEOUS | @ 10:00:00 | NDC 25021040201

## 2012-06-01 MED ADMIN — sodium chloride (NS) flush 5-10 mL: INTRAVENOUS | @ 20:00:00 | NDC 87701099893

## 2012-06-01 MED ADMIN — sodium chloride (NS) flush 5-10 mL: INTRAVENOUS | @ 04:00:00 | NDC 87701099893

## 2012-06-01 MED ADMIN — HYDROmorphone (PF) (DILAUDID) injection 2 mg: INTRAVENOUS | @ 07:00:00 | NDC 00409255201

## 2012-06-01 MED ADMIN — HYDROmorphone (DILAUDID) injection 2 mg: INTRAVENOUS | @ 22:00:00 | NDC 00641012121

## 2012-06-01 MED ADMIN — calcium acetate (PHOSLO) tablet 1,334 mg: ORAL | @ 23:00:00 | NDC 00574011302

## 2012-06-01 MED ADMIN — HYDROmorphone (DILAUDID) injection 2 mg: INTRAVENOUS | @ 20:00:00 | NDC 00641012121

## 2012-06-01 MED ADMIN — heparin (porcine) injection 5,000 Units: SUBCUTANEOUS | @ 02:00:00 | NDC 25021040201

## 2012-06-01 MED ADMIN — sodium chloride (NS) flush 5-10 mL: INTRAVENOUS | @ 10:00:00 | NDC 87701099893

## 2012-06-01 MED FILL — HYDROMORPHONE (PF) 1 MG/ML IJ SOLN: 1 mg/mL | INTRAMUSCULAR | Qty: 2

## 2012-06-01 MED FILL — BD POSIFLUSH NORMAL SALINE 0.9 % INJECTION SYRINGE: INTRAMUSCULAR | Qty: 20

## 2012-06-01 MED FILL — BD POSIFLUSH NORMAL SALINE 0.9 % INJECTION SYRINGE: INTRAMUSCULAR | Qty: 10

## 2012-06-01 MED FILL — HEPARIN (PORCINE) 5,000 UNIT/ML IJ SOLN: 5000 unit/mL | INTRAMUSCULAR | Qty: 1

## 2012-06-01 MED FILL — DIPHENHYDRAMINE HCL 50 MG/ML IJ SOLN: 50 mg/mL | INTRAMUSCULAR | Qty: 1

## 2012-06-01 MED FILL — NEPHROCAPS 1 MG CAPSULE: 1 mg | ORAL | Qty: 1

## 2012-06-01 MED FILL — HYDROMORPHONE 2 MG/ML INJECTION SOLUTION: 2 mg/mL | INTRAMUSCULAR | Qty: 1

## 2012-06-01 MED FILL — AMLODIPINE 5 MG TAB: 5 mg | ORAL | Qty: 2

## 2012-06-01 MED FILL — CALCIUM ACETATE 667 MG TAB: 667 mg | ORAL | Qty: 2

## 2012-06-01 MED FILL — LISINOPRIL 20 MG TAB: 20 mg | ORAL | Qty: 1

## 2012-06-01 MED FILL — INSULIN GLARGINE 100 UNIT/ML INJECTION: 100 unit/mL | SUBCUTANEOUS | Qty: 0.08

## 2012-06-01 MED FILL — NICOTINE 14 MG/24 HR DAILY PATCH: 14 mg/24 hr | TRANSDERMAL | Qty: 1

## 2012-06-01 MED FILL — METOPROLOL TARTRATE 25 MG TAB: 25 mg | ORAL | Qty: 1

## 2012-06-01 NOTE — Progress Notes (Signed)
Patient's blood pressure is 185/82. Dr. Charolette Child paged.

## 2012-06-01 NOTE — Progress Notes (Signed)
Hematology-Oncology Progress Note    Tyler Ballard  January 02, 1981  [id number]  06/01/2012    Follow-up for: sickle cell anemia     [x]         Chart notes since last visit reviewed   [x]         Medications reviewed for allergies and interactions       Case discussed with the following:         []         Case Manager                    []         Nursing Staff                                                                         []         Pathologist                                                                        []         FAMILY      Subjective:     Spoke with patient who complains of: pt.  Still has some back and leg pain, but it seems to be improving    Objective:     Patient Vitals for the past 24 hrs:   BP Temp Pulse Resp SpO2   06/01/12 0316 156/81 mmHg 98 ??F (36.7 ??C) 76  18  100 %   05/31/12 2326 149/79 mmHg 98.1 ??F (36.7 ??C) 77  18  100 %   05/31/12 1933 161/82 mmHg 98.9 ??F (37.2 ??C) 81  18  100 %   05/31/12 1655 171/83 mmHg 98.2 ??F (36.8 ??C) 79  18  100 %   05/31/12 1430 146/80 mmHg 98.7 ??F (37.1 ??C) 80  18  99 %   05/31/12 1343 172/84 mmHg 99 ??F (37.2 ??C) 90  18  95 %   05/31/12 1212 165/86 mmHg 98.8 ??F (37.1 ??C) 80  18  100 %   05/31/12 1152 158/89 mmHg 98.8 ??F (37.1 ??C) 80  18  100 %   05/31/12 1128 154/86 mmHg 98.9 ??F (37.2 ??C) 81  18  100 %   05/31/12 1054 181/92 mmHg 98.1 ??F (36.7 ??C) 89  16  100 %   05/31/12 0932 146/76 mmHg 98.2 ??F (36.8 ??C) 82  16  100 %       REVIEW OF SYSTEMS:    Constitutional: negative fever, negative chills, negative weight loss  Eyes:   negative visual changes  ENT:   negative sore throat, tongue or lip swelling  Respiratory:  negative cough, negative dyspnea  Cards:  negative for chest pain, palpitations, lower extremity edema  GI:   negative for nausea, vomiting, diarrhea, and abdominal pain  Neuro:  negative for headaches, dizziness, vertigo  [x]   Full ROS o/w normal/non contributor    Constitutional:  Patient looks  []         Sick  []          Frail  [x]         Better                                                 []         Depressed    HEENT:  [x]    NC                         []    AT               []     ALOPECIA           Eyes: [x]    Normal               []     Icteric  Oropharynx: [x]     Normal                  []   Thrush               []    Dry  Mucositis: [x]     None                 Grade: []         I  []         II  []         III  []         IV  Neck:   [x]    Supple                  []   Rigid               JVD:    [x]    ABSENT       []    PRESENT  Lymphadenopathy:   []    None Noted            []    PRESENT    Chest:  [x]    Clear               []     Rhonchi                      Dec'd @     []   Right Base           []    Left Base    CV:             [x]    Regular              []   Irregular               []    Tachy                []    Murmur  Abdominal:   [x]     Soft              []    NON-tender               []    Tender      BS:    [x]    ABSENT                   []   PRESENT  Liver:     [x]   NON-palp                  []    EDGE- palp  Spleen: [x]    NON-palp                   []   EDGE - palp  Mass:   [x]    ABSENT                          []   PRESENT  Extr:    []   Lymphedema             []    Cyanosis      []   Clubbing  Edema:     []    NONE       []    PRESENT  Skin:  Intact [x]            Purpura []         Rash: [x]    ABSENT       []   PRESENT  Neuro:  [x]         Normal  []         Confused      Available labs reviewed:  Labs:    Recent Results (from the past 24 hour(s))   GLUCOSE, POC    Collection Time    05/31/12 11:57 AM       Component Value Range    POC GLUCOSE 136 (*) 75 - 110 mg/dL    Performed by Huey Romans     GLUCOSE, POC    Collection Time    05/31/12  4:36 PM       Component Value Range    POC GLUCOSE 134 (*) 75 - 110 mg/dL    Performed by Huey Romans     GLUCOSE, POC    Collection Time    05/31/12  8:55 PM       Component Value Range    POC GLUCOSE 86  75 - 110 mg/dL    Performed by Arbie Cookey     GLUCOSE, POC    Collection Time    06/01/12  6:44  AM       Component Value Range    POC GLUCOSE 105  75 - 110 mg/dL    Performed by Rod Holler         Available Xrays reviewed:    Chemotherapy monitored and toxicities assessed:    Assessment and Plan   1. Sickle cell pain crisis... Probably precipitated by pneumonia...Marland Kitchenhe is still requiring IV pain meds, he looks much more  Comfortable , able to stop the pca today, but will leave the prn setting where it is at.. Pt. Says his crises usually last 3-4 days, is followed at [facility] in the fellow's clinic  2. Anemia..hgb 7.9 yesterday  Secondary to #1 +/_ esrd...  3. Pneumonia... Cultures negative so far, will continue antibiotics, will d/c hydrea which is suppressing his wbc, cxr looks better on my evaluation  4. ESRD... For dialysis today  Harlen Labs, MD

## 2012-06-01 NOTE — Procedures (Signed)
[facility]                         [phone]  Vitals Pre Post Assessment Pre Post   BP 175/91 150/79 LOC Alert,orientedx3. Alert,orientedx3.   HR 74 92 Lungs CTA CTA   Temp 98.1 98 Cardiac RRR RRR   Resp 18 16 Skin Warm,very dry,scaly. Warm,dry,scaly.   Weight 95.8kg 92.1kg Edema 2+ both legs. 1+ both legs.      Pain On pca pump dilaudid. Medicated by floor rn.     Orders   Duration: Start: 0855 End: 1255 Total: 4hours.   Dialyzer: Revaclear.   K Bath: 3   Ca Bath: 2   Na / Bicarb: 140/40   Target Fluid Removal: - increased to 3500 ml., per nephrologist order.( including ns rinses).     Access   Type & Location: Right upper arm avf.   Comments:     Avf cannulated with G15 needles without difficulty.  Avf had good blood flow and no signs of infection noted. Minimal bleeding from both needle sites post dialysis. Pressure dressings  applied over needle sites after bleeding had stopped.                        Labs   Hep B status / date: aAB positive/ 01/24/12.   Obtained/Reviewed  Critical Results Called Reviewed.  n/a     Meds Given   Name Dose Route   None.                 Total Liters Process: 88.5   Net Fluid Removed: 3000 ml.      Comments   Time Out Done: Yes/ 0845   Primary Nurse Rpt Pre: Kirk Ruths, RN.   Primary Nurse Rpt Post: Kirk Ruths, RN.   Pt Education: Salt and fluid restriction. Patient verbalized understanding. Stated" i dont eat salt."   Tx Summary: Received on nasal O2 at 2L/minute. No sob noted. Received on pca dilaudid pump.medicated self. Also medicated by floor rn with iv dilaudid. Tolerated hd well.  Returned to his room in nad.

## 2012-06-01 NOTE — Procedures (Signed)
Procedures  by Coralee Pesa, RN at 06/01/12 1445                Author: Coralee Pesa, RN  Service: --  Author Type: Registered Nurse       Filed: 06/01/12 1527  Date of Service: 06/01/12 1445  Status: Signed          Editor: Dimain, Milana Obey, RN (Registered Nurse)            Procedures        1. HEMODIALYSIS INPATIENT [DIA12 (Custom)]                                                  [facility]                         [phone]          Vitals  Pre  Post  Assessment  Pre  Post            BP  175/91  150/79  LOC  Alert,orientedx3.  Alert,orientedx3.            HR  74  92  Lungs  CTA  CTA            Temp  98.1  98  Cardiac  RRR  RRR     Resp  18  16  Skin  Warm,very dry,scaly.  Warm,dry,scaly.     Weight  95.8kg  92.1kg  Edema  2+ both legs.  1+ both legs.                  Pain  On pca pump dilaudid.  Medicated by floor rn.          Orders             Duration:  Start:  0855  End:  1255  Total:  4hours.        Dialyzer:  Revaclear.     K Bath:  3     Ca Bath:  2     Na / Bicarb:  140/40        Target Fluid Removal:  - increased to 3500 ml., per nephrologist order.( including ns rinses).          Access        Type & Location:  Right upper arm avf.       Comments:      Avf cannulated with G15 needles without difficulty.  Avf had good blood flow and no signs of infection noted. Minimal bleeding from  both needle sites post dialysis. Pressure dressings  applied over needle sites after bleeding had stopped.                              Labs        Hep B status / date:  aAB positive/ 01/24/12.        Obtained/Reviewed   Critical Results Called  Reviewed.   n/a          Meds Given         Name  Dose  Route         None.  Total Liters Process:  88.5        Net Fluid Removed:  3000 ml.           Comments        Time Out Done:  Yes/ 0845     Primary Nurse Rpt Pre:  Kirk Ruths, RN.     Primary Nurse Rpt Post:  Kirk Ruths, RN.        Pt Education:   Salt and fluid restriction. Patient verbalized understanding. Stated" i dont eat salt."        Tx Summary:  Received on nasal O2 at 2L/minute. No sob noted. Received on pca dilaudid pump.medicated self. Also medicated by floor rn with iv dilaudid.  Tolerated hd well.  Returned to his room in nad.

## 2012-06-01 NOTE — Progress Notes (Signed)
Hospitalist Progress Note         Dionisio Paschal, MD   Tyler Ballard [phone]  Call physician on-call through the operator 7pm-7am    Daily Progress Note: 06/01/2012    PCP- Phys Other, MD  Assessment/Plan:   1. LLB pneumonia, poa- On zosyn and Levaquin. Zosyn stopped per ID, cont levaquin, Follow Cx , no growth thus far,completed levaquin course  2. Sickle cell crisis, poa - Pt on dilaudid PCA, encourge po hydration, 02 as needed . Continue Hydrea.To follow closely and adjust as appropriate, added zofran for nausea,   3. ESRD on HD - HD as per nephrology   4. Anemia - Hb 7.5-7.9 , transfuse 1 unit 6/12 for hb on 7.2 with HD  But clotted, hematology f/up , cont to monitor, transfuse unit 16/13 ,and repeat  Transfusion 6/14  5. Hyponatremia - monitor, stable    6. HTN- BP consistently higher, , pain contributing ? ,increased norvasc to 10 , still not better controlled , started on metoprolol 12.5 mg 6/13, will cont to monitor ,   - had BP in 200s 6/12 post dialysis ,says he was really terrified after clotting   - continue to monitor , cont norvasc , metoprolol  7. DM- resume lantus, ssi  , had hypoglycemia 6/12 as pt hadn't eaten due to HD  8. Tobaco abuse - nicotine, counseled   9. Chronic pain - pain management as per hematology   10. DVT prophylaxis- heparin  11. Plan- Home on po abx soon once pain controlled       Subjective:   , pain still not undercontrol, but says feeling a lot better     Review of Systems:   A comprehensive review of systems was negative except for that written in the HPI.    Objective:   Physical Exam:     BP 156/81   Pulse 76   Temp 98 ??F (36.7 ??C)   Resp 18   Ht 5\' 5"  (1.651 m)   Wt 203 lb 4.2 oz   BMI 33.82 kg/m2   SpO2 100% O2 Flow Rate (L/min): 2 l/min O2 Device: Room air    Temp (24hrs), Avg:98.5 ??F (36.9 ??C), Min:98 ??F (36.7 ??C), Max:99 ??F (37.2 ??C)        06/12 1900 - 06/14 0659  In: 310   Out: -     General: Well nourished, calm, appropriate, NAD  ambulatory  HEENT: PERRLA  bilaterally, neck supple, no JVD, no thyromegaly, no lymphadenopathy   CVS: RRR, no murmurs, rubs or gallops   Lungs: diminished left base otherwise clear to auscultation, no wheezing, rales or rhonchi   GI: soft nontender, NABS. No rebound or guarding   EXT: 2+ pulses bilaterally, trace edema, fistula right arm   Neuro: Cranial nerves intact, strength 5/5 in all 4 extremities, no gross focal neuro deficits, AAO X3   Skin: intact   Psych: normal affect    Data Review:       Recent Days:  Recent Labs   [facility] 05/31/12 0355    WBC 2.4*    HGB 7.9*    HCT 25.3*    PLT 157     Recent Labs   Basename 05/31/12 0355    NA 135*    K 4.9    CL 100    CO2 30    GLU 105*    BUN 25*    CREA 4.85*    CA 8.6    MG 1.7  PHOS 3.7    ALB --    TBIL --    SGOT --    INR --     No results found for this basename: PH:3,PCO2:3,PO2:3,HCO3:3,FIO2:3 in the last 72 hours    24 Hour Results:  Recent Results (from the past 24 hour(s))   GLUCOSE, POC    Collection Time    05/31/12 11:57 AM       Component Value Range    POC GLUCOSE 136 (*) 75 - 110 mg/dL    Performed by Huey Romans     GLUCOSE, POC    Collection Time    05/31/12  4:36 PM       Component Value Range    POC GLUCOSE 134 (*) 75 - 110 mg/dL    Performed by Huey Romans     GLUCOSE, POC    Collection Time    05/31/12  8:55 PM       Component Value Range    POC GLUCOSE 86  75 - 110 mg/dL    Performed by Arbie Cookey     GLUCOSE, POC    Collection Time    Jun 15, 2012  6:44 AM       Component Value Range    POC GLUCOSE 105  75 - 110 mg/dL    Performed by Rod Holler         Problem List:  Problem List as of 06/15/2012  Never Reviewed      Codes Class Noted - Resolved    *Sickle cell crisis 282.62  05/24/2012 - Present              Medications reviewed  Current Facility-Administered Medications   Medication Dose Route Frequency   ??? sodium chloride (NS) 0.9 % flush       ??? metoprolol (LOPRESSOR) tablet 25 mg  25 mg Oral BID   ??? HYDROmorphone (DILAUDID) injection 2 mg  2 mg IntraVENous  Q3H PRN   ??? lisinopril (PRINIVIL, ZESTRIL) tablet 20 mg  20 mg Oral DAILY   ??? sodium chloride (NS) 0.9 % flush       ??? sodium chloride (NS) 0.9 % flush       ??? 0.9% sodium chloride infusion  25 mL/hr IntraVENous CONTINUOUS   ??? acetaminophen (TYLENOL) tablet 650 mg  650 mg Oral Q4H PRN   ??? amLODIPine (NORVASC) tablet 10 mg  10 mg Oral DAILY   ??? metoclopramide HCl (REGLAN) injection 10 mg  10 mg IntraVENous Q6H PRN   ??? hydrALAZINE (APRESOLINE) tablet 10 mg  10 mg Oral Q6H PRN   ??? insulin lispro (HUMALOG) injection   SubCUTAneous TIDAC   ??? calcium acetate (PHOSLO) tablet 1,334 mg  2 Tab Oral TID WITH MEALS   ??? sodium chloride (NS) flush 5-10 mL  5-10 mL IntraVENous Q8H   ??? sodium chloride (NS) flush 5-10 mL  5-10 mL IntraVENous PRN   ??? heparin (porcine) injection 5,000 Units  5,000 Units SubCUTAneous Q8H   ??? 0.9% sodium chloride infusion  75 mL/hr IntraVENous CONTINUOUS   ??? B complex-vitaminC-folic acid (NEPHROCAP) cap  1 Cap Oral DAILY   ??? diphenhydrAMINE (BENADRYL) injection 12.5 mg  12.5 mg IntraVENous Q6H PRN   ??? nicotine (NICODERM CQ) 14 mg/24 hr patch 1 Patch  1 Patch TransDERmal Q24H   ??? insulin glargine (LANTUS) injection 8 Units  8 Units SubCUTAneous DAILY   ??? glucose chewable tablet 16 g  4 Tab Oral PRN   ??? dextrose (D50W)  injection Syrg 12.5-25 g  12.5-25 g IntraVENous PRN   ??? glucagon (GLUCAGEN) injection 1 mg  1 mg IntraMUSCular PRN       Care Plan discussed with: Patient/Family and Nurse    Total time spent with patient: 30 minutes.    Laquan Ludden Karren Cobble, MD

## 2012-06-01 NOTE — Progress Notes (Signed)
Nephrology Progress Note    Date of Admission : 05/24/2012    CC:  Follow up for ESRD       Assessment and Plan   ESRD : HD today. UF only 2 kg   Anemia : SC D + ESRD. S/p one more unit yesterday    HTN :improving. increased B blocker   SCC: improving. Hopefully d/c soon    LLL PNA : on Abx. cxr unchanged- but clinically better     For other plans, see orders.    Interval History:  One PRBC unit transfused yesterday   Refusing HD this am   Feeling better     Current Facility-Administered Medications   Medication Dose Route Frequency   ??? sodium chloride (NS) 0.9 % flush       ??? sodium chloride (NS) 0.9 % flush       ??? metoprolol (LOPRESSOR) tablet 25 mg  25 mg Oral BID   ??? lisinopril (PRINIVIL, ZESTRIL) tablet 20 mg  20 mg Oral DAILY   ??? sodium chloride (NS) 0.9 % flush       ??? sodium chloride (NS) 0.9 % flush       ??? 0.9% sodium chloride infusion  25 mL/hr IntraVENous CONTINUOUS   ??? acetaminophen (TYLENOL) tablet 650 mg  650 mg Oral Q4H PRN   ??? amLODIPine (NORVASC) tablet 10 mg  10 mg Oral DAILY   ??? metoclopramide HCl (REGLAN) injection 10 mg  10 mg IntraVENous Q6H PRN   ??? hydrALAZINE (APRESOLINE) tablet 10 mg  10 mg Oral Q6H PRN   ??? insulin lispro (HUMALOG) injection   SubCUTAneous TIDAC   ??? HYDROmorphone (PF) (DILAUDID) injection 2 mg  2 mg IntraVENous Q3H PRN   ??? HYDROmorphone (PF) 15 mg/30 ml (DILAUDID) PCA   IntraVENous CONTINUOUS   ??? calcium acetate (PHOSLO) tablet 1,334 mg  2 Tab Oral TID WITH MEALS   ??? sodium chloride (NS) flush 5-10 mL  5-10 mL IntraVENous Q8H   ??? sodium chloride (NS) flush 5-10 mL  5-10 mL IntraVENous PRN   ??? heparin (porcine) injection 5,000 Units  5,000 Units SubCUTAneous Q8H   ??? 0.9% sodium chloride infusion  75 mL/hr IntraVENous CONTINUOUS   ??? B complex-vitaminC-folic acid (NEPHROCAP) cap  1 Cap Oral DAILY   ??? diphenhydrAMINE (BENADRYL) injection 12.5 mg  12.5 mg IntraVENous Q6H PRN   ??? nicotine (NICODERM CQ) 14 mg/24 hr patch 1 Patch  1 Patch TransDERmal Q24H   ??? insulin glargine  (LANTUS) injection 8 Units  8 Units SubCUTAneous DAILY   ??? glucose chewable tablet 16 g  4 Tab Oral PRN   ??? dextrose (D50W) injection Syrg 12.5-25 g  12.5-25 g IntraVENous PRN   ??? glucagon (GLUCAGEN) injection 1 mg  1 mg IntraMUSCular PRN        Review of Systems:  Pertinent items are noted in HPI.    Objective:  Vitals:    Filed Vitals:    05/31/12 1655 05/31/12 1933 05/31/12 2326 06/01/12 0316   BP: 171/83 161/82 149/79 156/81   Pulse: 79 81 77 76   Temp: 98.2 ??F (36.8 ??C) 98.9 ??F (37.2 ??C) 98.1 ??F (36.7 ??C) 98 ??F (36.7 ??C)   Resp: 18 18 18 18    Height:       Weight:       SpO2: 100% 100% 100% 100%     Intake and Output:     06/12 1900 - 06/14 0659  In: 310   Out: -  Physical Examination:  General: Mildly distressed from pain   Neck:  Supple, no mass  Resp:  Lungs CTA B/L, no wheezing , normal respiratory effort  CV:  RRR,  no murmur or rub,trace  LE edema  GI:  Soft, NT, + Bowel sounds, no hepatosplenomegaly  Neurologic:  Non focal  Psych:             AAO x 3 appropriate affect   Skin:  No Rash. Multiple acneform lesions on face       []     High complexity decision making was performed  []     Patient is at high-risk of decompensation with multiple organ involvement    Lab Data Personally Reviewed: I have reviewed all the pertinent labs, microbiology data and radiology studies during assessment.    Recent Labs   [facility] 05/31/12 0355    NA 135*    K 4.9    CL 100    CO2 30    GLU 105*    BUN 25*    CREA 4.85*    CA 8.6    MG 1.7    PHOS 3.7    ALB --    TBIL --    SGOT --    INR --     Recent Labs   Basename 05/31/12 0355    WBC 2.4*    HGB 7.9*    HCT 25.3*    PLT 157     Lab Results   Component Value Date/Time    Specimen Description: BLOOD 05/24/2012 10:47 AM     Lab Results   Component Value Date/Time    Culture result: NO GROWTH 5 DAYS 05/24/2012 10:47 AM     Recent Results (from the past 24 hour(s))   GLUCOSE, POC    Collection Time    05/31/12 11:57 AM       Component Value Range    POC GLUCOSE 136 (*) 75 -  110 mg/dL    Performed by Huey Romans     GLUCOSE, POC    Collection Time    05/31/12  4:36 PM       Component Value Range    POC GLUCOSE 134 (*) 75 - 110 mg/dL    Performed by Huey Romans     GLUCOSE, POC    Collection Time    05/31/12  8:55 PM       Component Value Range    POC GLUCOSE 86  75 - 110 mg/dL    Performed by Arbie Cookey     GLUCOSE, POC    Collection Time    06/01/12  6:44 AM       Component Value Range    POC GLUCOSE 105  75 - 110 mg/dL    Performed by Rod Holler         Total time spent with patient:  15  min.                               Care Plan discussed with:  Patient  x   Family      RN  x    Consulting Physician /Specialist        I have reviewed the flowsheets.  Chart reviewed. Pertinent Notes reviewed.   Current Medications list reviewed by me.  No change in PMH ,family and social history from Consult note.    Rhae Hammock, MD  06/01/2012  [facility]  [address],   [place] [address]  Phone - [phone]   Fax: [phone]  [email or web address]

## 2012-06-01 NOTE — Progress Notes (Addendum)
Patient refused to come to dialysis when transport arrived. I spoke with Amy, patient's nurse and ask her to inform patient if he refuses he will not get dialysis today. Amy is talking with physician and pt.     0815 Amy called back. Patient has agreed to come to dialysis now.    1318 Report called to Carney Bern regarding PCA dc'd, pain medications and low blood glucose.

## 2012-06-01 NOTE — Progress Notes (Signed)
NUTRITION       Spoke with patient.  Consumed all of lunch tray but c/o of food and lack of options.  Patient non-compliant with renal diet and requesting diet order to be changed back to regular diet.  Chart reviewed, weight changes noted.  BG levels noted- on lantus and SSI.  On nephrocaps and phoslo.  Suspect patient will be able to meet nutrition needs with PO intake alone. Recommend to consider changing diet back to Regular NCS.     Georgiana Shore, RD

## 2012-06-01 NOTE — Progress Notes (Signed)
Bedside shift change report given to Amy RN (oncoming nurse) by Esther RN (offgoing nurse).  Report given with SBAR, Kardex, Intake/Output and MAR.

## 2012-06-01 NOTE — Progress Notes (Signed)
TRANSFER - IN REPORT:    Verbal report received from Ester(name) on [employer]  being received from [facility](unit) for Dialysis      Report consisted of patient???s Situation, Background, Assessment and   Recommendations(SBAR).     Information from the following report(s) SBAR and Recent Results was reviewed with the receiving nurse.    Opportunity for questions and clarification was provided.      Assessment completed upon patient???s arrival to unit and care assumed.

## 2012-06-02 LAB — GLUCOSE, POC
Glucose (POC): 124 mg/dL — ABNORMAL HIGH (ref 75–110)
Glucose (POC): 110 mg/dL (ref 75–110)
Glucose (POC): 130 mg/dL — ABNORMAL HIGH (ref 75–110)
Glucose (POC): 84 mg/dL (ref 75–110)

## 2012-06-02 MED ADMIN — HYDROmorphone (DILAUDID) injection 2 mg: INTRAVENOUS | @ 05:00:00 | NDC 00641012121

## 2012-06-02 MED ADMIN — HYDROmorphone (DILAUDID) injection 2 mg: INTRAVENOUS | @ 11:00:00 | NDC 00641012121

## 2012-06-02 MED ADMIN — insulin glargine (LANTUS) injection 8 Units: SUBCUTANEOUS | @ 12:00:00 | NDC 09999222001

## 2012-06-02 MED ADMIN — HYDROmorphone (DILAUDID) injection 2 mg: INTRAVENOUS | @ 20:00:00 | NDC 00641012121

## 2012-06-02 MED ADMIN — calcium acetate (PHOSLO) tablet 1,334 mg: ORAL | @ 13:00:00 | NDC 00574011302

## 2012-06-02 MED ADMIN — diphenhydrAMINE (BENADRYL) injection 12.5 mg: INTRAVENOUS | @ 20:00:00 | NDC 00641037621

## 2012-06-02 MED ADMIN — sodium chloride (NS) flush 5-10 mL: INTRAVENOUS | @ 11:00:00 | NDC 87701099893

## 2012-06-02 MED ADMIN — metoprolol (LOPRESSOR) tablet 25 mg: ORAL | @ 21:00:00 | NDC 00378001805

## 2012-06-02 MED ADMIN — sodium chloride (NS) 0.9 % flush: @ 05:00:00 | NDC 87701099893

## 2012-06-02 MED ADMIN — heparin (porcine) injection 5,000 Units: SUBCUTANEOUS | @ 20:00:00 | NDC 25021040201

## 2012-06-02 MED ADMIN — lisinopril (PRINIVIL, ZESTRIL) tablet 20 mg: ORAL | @ 12:00:00 | NDC 68084006211

## 2012-06-02 MED ADMIN — heparin (porcine) injection 5,000 Units: SUBCUTANEOUS | @ 01:00:00 | NDC 25021040201

## 2012-06-02 MED ADMIN — hydrALAZINE (APRESOLINE) tablet 10 mg: ORAL | @ 01:00:00 | NDC 68084044711

## 2012-06-02 MED ADMIN — HYDROmorphone (DILAUDID) injection 2 mg: INTRAVENOUS | @ 08:00:00 | NDC 00641012121

## 2012-06-02 MED ADMIN — amLODIPine (NORVASC) tablet 10 mg: ORAL | @ 12:00:00 | NDC 59762153006

## 2012-06-02 MED ADMIN — sodium chloride (NS) flush 5-10 mL: INTRAVENOUS | @ 18:00:00 | NDC 87701099893

## 2012-06-02 MED ADMIN — HYDROmorphone (DILAUDID) injection 2 mg: INTRAVENOUS | @ 01:00:00 | NDC 00641012121

## 2012-06-02 MED ADMIN — HYDROmorphone (DILAUDID) injection 2 mg: INTRAVENOUS | @ 14:00:00 | NDC 00641012121

## 2012-06-02 MED ADMIN — diphenhydrAMINE (BENADRYL) injection 12.5 mg: INTRAVENOUS | @ 08:00:00 | NDC 00641037621

## 2012-06-02 MED ADMIN — calcium acetate (PHOSLO) tablet 1,334 mg: ORAL | @ 17:00:00 | NDC 00574011302

## 2012-06-02 MED ADMIN — sodium chloride (NS) 0.9 % flush: @ 08:00:00 | NDC 87701099893

## 2012-06-02 MED ADMIN — sodium chloride (NS) flush 5-10 mL: INTRAVENOUS | @ 14:00:00 | NDC 87701099893

## 2012-06-02 MED ADMIN — heparin (porcine) injection 5,000 Units: SUBCUTANEOUS | @ 10:00:00 | NDC 25021040201

## 2012-06-02 MED ADMIN — HYDROmorphone (DILAUDID) injection 2 mg: INTRAVENOUS | @ 23:00:00 | NDC 00641012121

## 2012-06-02 MED ADMIN — metoprolol (LOPRESSOR) tablet 25 mg: ORAL | @ 13:00:00 | NDC 62584026511

## 2012-06-02 MED ADMIN — sodium chloride (NS) flush 5-10 mL: INTRAVENOUS | @ 17:00:00 | NDC 87701099893

## 2012-06-02 MED ADMIN — calcium acetate (PHOSLO) tablet 1,334 mg: ORAL | @ 21:00:00 | NDC 00574011302

## 2012-06-02 MED ADMIN — diphenhydrAMINE (BENADRYL) injection 12.5 mg: INTRAVENOUS | @ 14:00:00 | NDC 00641037621

## 2012-06-02 MED ADMIN — sodium chloride (NS) flush 5-10 mL: INTRAVENOUS | @ 01:00:00 | NDC 87701099893

## 2012-06-02 MED ADMIN — diphenhydrAMINE (BENADRYL) injection 12.5 mg: INTRAVENOUS | @ 01:00:00 | NDC 00641037621

## 2012-06-02 MED ADMIN — HYDROmorphone (DILAUDID) injection 2 mg: INTRAVENOUS | @ 17:00:00 | NDC 00641012121

## 2012-06-02 MED ADMIN — hydrALAZINE (APRESOLINE) tablet 10 mg: ORAL | @ 08:00:00 | NDC 68084044711

## 2012-06-02 MED FILL — HEPARIN (PORCINE) 5,000 UNIT/ML IJ SOLN: 5000 unit/mL | INTRAMUSCULAR | Qty: 1

## 2012-06-02 MED FILL — NICOTINE 14 MG/24 HR DAILY PATCH: 14 mg/24 hr | TRANSDERMAL | Qty: 1

## 2012-06-02 MED FILL — CALCIUM ACETATE 667 MG TAB: 667 mg | ORAL | Qty: 2

## 2012-06-02 MED FILL — AMLODIPINE 5 MG TAB: 5 mg | ORAL | Qty: 2

## 2012-06-02 MED FILL — BD POSIFLUSH NORMAL SALINE 0.9 % INJECTION SYRINGE: INTRAMUSCULAR | Qty: 20

## 2012-06-02 MED FILL — HYDROMORPHONE 2 MG/ML INJECTION SOLUTION: 2 mg/mL | INTRAMUSCULAR | Qty: 1

## 2012-06-02 MED FILL — BD POSIFLUSH NORMAL SALINE 0.9 % INJECTION SYRINGE: INTRAMUSCULAR | Qty: 10

## 2012-06-02 MED FILL — METOPROLOL TARTRATE 25 MG TAB: 25 mg | ORAL | Qty: 1

## 2012-06-02 MED FILL — LISINOPRIL 20 MG TAB: 20 mg | ORAL | Qty: 1

## 2012-06-02 MED FILL — DIPHENHYDRAMINE HCL 50 MG/ML IJ SOLN: 50 mg/mL | INTRAMUSCULAR | Qty: 1

## 2012-06-02 MED FILL — HYDRALAZINE 10 MG TAB: 10 mg | ORAL | Qty: 1

## 2012-06-02 MED FILL — INSULIN GLARGINE 100 UNIT/ML INJECTION: 100 unit/mL | SUBCUTANEOUS | Qty: 0.08

## 2012-06-02 MED FILL — NEPHROCAPS 1 MG CAPSULE: 1 mg | ORAL | Qty: 1

## 2012-06-02 NOTE — Progress Notes (Signed)
Bedside shift change report given to Jean RN (oncoming nurse) by Esther RN (offgoing nurse).  Report given with SBAR, Kardex, Intake/Output and MAR.

## 2012-06-02 NOTE — Progress Notes (Signed)
Hematology Oncology Progress Note    Follow up for: sickle cell crisis    Chart notes reviewed since last visit.    Case discussed with following: patient    Patient complains of the following: reports is still in significant crisis and can not be transitioned to oral meds just yet    Additional concerns noted by the staff:     Patient Vitals for the past 24 hrs:   BP Temp Pulse Resp SpO2   06/02/12 0555 160/68 mmHg - 79  - -   06/02/12 0400 171/91 mmHg 98.6 ??F (37 ??C) 81  18  100 %   06/01/12 2316 144/74 mmHg 98.7 ??F (37.1 ??C) 85  18  97 %   06/01/12 1957 185/82 mmHg 99.4 ??F (37.4 ??C) 96  16  94 %   06/01/12 1640 182/88 mmHg 98.2 ??F (36.8 ??C) 90  16  96 %     ROS - negative for 12 organ systems    Physical Examination:  Constitutional Alert, cooperative, oriented. Mood and affect appropriate. Appears close to chronological age. Well nourished. Well developed.   Head Normocephalic; no scars   Eyes Conjunctivae and sclerae are clear and without icterus. Pupils are reactive and equal.   ENMT Sinuses are nontender.  No oral exudates, ulcers, masses, thrush or mucositis. Oropharynx clear.  Tongue normal.   Neck Supple without masses or thyromegaly. No jugular venous distension.   Hematologic/Lymphatic No petechiae or purpura.  No tender or palpable lymph nodes noted   Respiratory Lungs are clear to auscultation without rhonchi or wheezing.   Cardiovascular Regular rate and rhythm of heart without murmurs, gallops or rubs.   Chest / Line Site Chest is symmetric with no chest wall deformities.   Abdomen Non-tender, non-distended, no masses, ascites or hepatosplenomegaly. Good bowel sounds. No guarding or rebound tenderness. No pulsatile masses.   Musculoskeletal No tenderness or swelling, normal range of motion without obvious weakness.   Extremities No visible deformities, no cyanosis, clubbing or edema.    Skin No rashes, scars, or lesions suggestive of malignancy. No petechiae, purpura, or ecchymoses. No excoriations.     Neurologic No sensory or motor deficits noted grossly   Psychiatric Alert and oriented.       Labs:  Recent Results (from the past 24 hour(s))   GLUCOSE, POC    Collection Time    06/01/12  4:36 PM       Component Value Range    POC GLUCOSE 108  75 - 110 mg/dL    Performed by GRUBBS JEAN     GLUCOSE, POC    Collection Time    06/01/12  8:57 PM       Component Value Range    POC GLUCOSE 124 (*) 75 - 110 mg/dL    Performed by Dow Adolph     GLUCOSE, POC    Collection Time    06/02/12  7:17 AM       Component Value Range    POC GLUCOSE 110  75 - 110 mg/dL    Performed by PETTIS ERNESTINE     GLUCOSE, POC    Collection Time    06/02/12 12:11 PM       Component Value Range    POC GLUCOSE 130 (*) 75 - 110 mg/dL    Performed by GRUBBS JEAN         Assessment and Plan: The principles of managing a pain crisis include appropriate IV hydration as well as administration  of opioids, antiemetics, anti-pruritics, and possibly NSAIDs.  Typically, D5 ?? NSS at a rate of 100-150 cc/hr is given. One needs to keep in mind that sickle cell patients have hyposthenuria, such that they can not concentrate their urine and avoid dehydration if unable to maintain oral intake. One needs to be careful to avoid hyper-hydration if there is pulmonary hypertension or renal disease.   The current recommendation for oxygen is to supplement only if there is a low O2 saturation which in SS disease is defined as a saturation of less than 93%. There is no benefit to additional oxygen if the patient is not hypoxemic. One does need to watch for nocturnal desaturation so if the patient is not on supplemental oxygen, an O2 saturation should be checked periodically at night.   Recommended doses of opioids for management of pain crisis for those not chronically on opioids are morphine 5-10 mg and hydromoprhone 1-2 mg with higher doses if opioid tolerant. The initial doses are repeated q 15 minutes until good pain relief is achieved but the patient is not  overly sedated. Subsequent doses are given every 2-4 hours. Delivery via a patient controlled analgesia pump may be more efficacious and efficient. For an opioid na??ve patient, recommended doses for hydromorphone is 0.2-0.5 mg/hr and prn and for morphine is 1-4 mg/hr and prn with 6-10 minute lockouts and with monitoring of VS, O2 saturation, and pain score. Higher doses are used in the opioid tolerant.  Meperidine is to be avoided to avoid seizures.  Nausea is a common side effect of opioids. Phenergan, prochloroperazine, and ondansetron are ordered to assist in relief of nausea. Opioids also cause histamine release from mast cells. Diphenhydramine 25-50 mg q 4-6 hours either PO or IV may be used to relieve itch.  Hydroxyzine (vistaril) may also prove useful and for intractable itch, continuous infusion naloxone 0.25 ??? 1 mcg/kg/hr as a drip is highly effective but is too low a dose to interfere with pain relief.  Constipation also needs to be addressed and stool softeners and/or laxatives need to be prescribed. NSAIDs are contraindicated in the setting of pulmonary hypertension or renal insufficiency but is a useful adjunct for pain relief. Oral ibuprofen or toradol are commonly used for this purpose.   Incentive spirometry q 2 hours has been shown to reduce the risk of acute chest syndrome. Ambulation also decreases the risk of acute chest syndrome and he should be encouraged to walk.

## 2012-06-02 NOTE — Progress Notes (Signed)
Hospitalist Progress Note         Dionisio Paschal, MD   Romilda Joy [phone]  Call physician on-call through the operator 7pm-7am    Daily Progress Note: 06/02/2012    PCP- Phys Other, MD  Assessment/Plan:   1. LLB pneumonia, poa- On zosyn and Levaquin. Zosyn stopped per ID, Follow Cx , no growth thus far,completed levaquin course  2. Sickle cell crisis, poa -  encourge po hydration, 02 as needed . Continue Hydrea.To follow closely and adjust as appropriate, added zofran for nausea, pt off pca ,not helping , started on iv prn   3. ESRD on HD - HD as per nephrology   4. Anemia - Hb 7.5-7.9 , transfuse 1 unit 6/12 for hb on 7.2 with HD  But clotted, hematology f/up , cont to monitor, 1 unit  on 6/13,and repeat   5. Hyponatremia - monitor, stable    6. HTN- BP consistently higher, , pain contributing ? ,increased norvasc to 10 , still not better controlled , started on metoprolol 12.5 mg 6/13, will cont to monitor ,   - had BP in 200s 6/12 post dialysis ,says he was really terrified after clotting   - continue to monitor , cont norvasc , metoprolol , still not better controlled, but continuously have pain   7. DM-  lantus, ssi  , had hypoglycemia 6/12 as pt hadn't eaten due to HD  8. Tobaco abuse - nicotine, counseled   9. Chronic pain - pain management as per hematology   10. DVT prophylaxis- heparin  11. Plan- Home on po abx soon once pain controlled       Subjective:   , pain still not undercontrol, says bil lower leg pain , which is worse     Review of Systems:   A comprehensive review of systems was negative except for that written in the HPI.    Objective:   Physical Exam:     BP 160/68   Pulse 79   Temp 98.6 ??F (37 ??C)   Resp 18   Ht 5\' 5"  (1.651 m)   Wt 203 lb 4.2 oz   BMI 33.82 kg/m2   SpO2 100% O2 Flow Rate (L/min): 2 l/min O2 Device: Room air    Temp (24hrs), Avg:98.6 ??F (37 ??C), Min:98.1 ??F (36.7 ??C), Max:99.4 ??F (37.4 ??C)        06/13 1900 - 06/15 0659  In: -   Out: 3000     General: Well  nourished, calm, appropriate, NAD  ambulatory  HEENT: PERRLA bilaterally, neck supple, no JVD, no thyromegaly, no lymphadenopathy   CVS: RRR, no murmurs, rubs or gallops   Lungs: diminished left base otherwise clear to auscultation, no wheezing, rales or rhonchi   GI: soft nontender, NABS. No rebound or guarding   EXT: 2+ pulses bilaterally, trace edema, fistula right arm   Neuro: Cranial nerves intact, strength 5/5 in all 4 extremities, no gross focal neuro deficits, AAO X3   Skin: intact   Psych: normal affect    Data Review:       Recent Days:  Recent Labs   Basename 05/31/12 0355    WBC 2.4*    HGB 7.9*    HCT 25.3*    PLT 157     Recent Labs   Basename 05/31/12 0355    NA 135*    K 4.9    CL 100    CO2 30    GLU 105*    BUN  25*    CREA 4.85*    CA 8.6    MG 1.7    PHOS 3.7    ALB --    TBIL --    SGOT --    INR --     No results found for this basename: PH:3,PCO2:3,PO2:3,HCO3:3,FIO2:3 in the last 72 hours    24 Hour Results:  Recent Results (from the past 24 hour(s))   GLUCOSE, POC    Collection Time    06/01/12 11:15 AM       Component Value Range    POC GLUCOSE 67 (*) 75 - 110 mg/dL    Performed by Alessandra Grout     GLUCOSE, POC    Collection Time    06/01/12 11:31 AM       Component Value Range    POC GLUCOSE 62 (*) 75 - 110 mg/dL    Performed by Alessandra Grout     GLUCOSE, POC    Collection Time    06/01/12 11:52 AM       Component Value Range    POC GLUCOSE 69 (*) 75 - 110 mg/dL    Performed by Alessandra Grout     GLUCOSE, POC    Collection Time    06/01/12 12:09 PM       Component Value Range    POC GLUCOSE 171 (*) 75 - 110 mg/dL    Performed by Alessandra Grout     GLUCOSE, POC    Collection Time    06/01/12  2:07 PM       Component Value Range    POC GLUCOSE 166 (*) 75 - 110 mg/dL    Performed by PETTIS ERNESTINE     GLUCOSE, POC    Collection Time    06/01/12  4:36 PM       Component Value Range    POC GLUCOSE 108  75 - 110 mg/dL    Performed by GRUBBS JEAN     GLUCOSE, POC    Collection Time     06/01/12  8:57 PM       Component Value Range    POC GLUCOSE 124 (*) 75 - 110 mg/dL    Performed by Dow Adolph     GLUCOSE, POC    Collection Time    06-17-12  7:17 AM       Component Value Range    POC GLUCOSE 110  75 - 110 mg/dL    Performed by PETTIS ERNESTINE         Problem List:  Problem List as of 2012/06/17  Never Reviewed      Codes Class Noted - Resolved    *Sickle cell crisis 282.62  05/24/2012 - Present              Medications reviewed  Current Facility-Administered Medications   Medication Dose Route Frequency   ??? sodium chloride (NS) 0.9 % flush       ??? sodium chloride (NS) 0.9 % flush       ??? metoprolol (LOPRESSOR) tablet 25 mg  25 mg Oral BID   ??? HYDROmorphone (DILAUDID) injection 2 mg  2 mg IntraVENous Q3H PRN   ??? lisinopril (PRINIVIL, ZESTRIL) tablet 20 mg  20 mg Oral DAILY   ??? 0.9% sodium chloride infusion  25 mL/hr IntraVENous CONTINUOUS   ??? acetaminophen (TYLENOL) tablet 650 mg  650 mg Oral Q4H PRN   ??? amLODIPine (NORVASC) tablet 10 mg  10 mg Oral DAILY   ???  metoclopramide HCl (REGLAN) injection 10 mg  10 mg IntraVENous Q6H PRN   ??? hydrALAZINE (APRESOLINE) tablet 10 mg  10 mg Oral Q6H PRN   ??? insulin lispro (HUMALOG) injection   SubCUTAneous TIDAC   ??? calcium acetate (PHOSLO) tablet 1,334 mg  2 Tab Oral TID WITH MEALS   ??? sodium chloride (NS) flush 5-10 mL  5-10 mL IntraVENous Q8H   ??? sodium chloride (NS) flush 5-10 mL  5-10 mL IntraVENous PRN   ??? heparin (porcine) injection 5,000 Units  5,000 Units SubCUTAneous Q8H   ??? 0.9% sodium chloride infusion  75 mL/hr IntraVENous CONTINUOUS   ??? B complex-vitaminC-folic acid (NEPHROCAP) cap  1 Cap Oral DAILY   ??? diphenhydrAMINE (BENADRYL) injection 12.5 mg  12.5 mg IntraVENous Q6H PRN   ??? nicotine (NICODERM CQ) 14 mg/24 hr patch 1 Patch  1 Patch TransDERmal Q24H   ??? insulin glargine (LANTUS) injection 8 Units  8 Units SubCUTAneous DAILY   ??? glucose chewable tablet 16 g  4 Tab Oral PRN   ??? dextrose (D50W) injection Syrg 12.5-25 g  12.5-25 g IntraVENous PRN    ??? glucagon (GLUCAGEN) injection 1 mg  1 mg IntraMUSCular PRN       Care Plan discussed with: Patient/Family and Nurse    Total time spent with patient: 30 minutes.    Kaylean Tupou Karren Cobble, MD

## 2012-06-03 LAB — CBC WITH AUTOMATED DIFF
ABS. BASOPHILS: 0 10*3/uL (ref 0.0–0.1)
ABS. EOSINOPHILS: 0.1 10*3/uL (ref 0.0–0.4)
ABS. LYMPHOCYTES: 0.8 10*3/uL (ref 0.8–3.5)
ABS. MONOCYTES: 0.2 10*3/uL (ref 0.0–1.0)
ABS. NEUTROPHILS: 1.6 10*3/uL — ABNORMAL LOW (ref 1.8–8.0)
BASOPHILS: 0 % (ref 0–1)
EOSINOPHILS: 3 % (ref 0–7)
HCT: 27.8 % — ABNORMAL LOW (ref 36.6–50.3)
HGB: 8.9 g/dL — ABNORMAL LOW (ref 12.1–17.0)
LYMPHOCYTES: 31 % (ref 12–49)
MCH: 30.2 PG (ref 26.0–34.0)
MCHC: 32 g/dL (ref 30.0–36.5)
MCV: 94.2 FL (ref 80.0–99.0)
MONOCYTES: 9 % (ref 5–13)
NEUTROPHILS: 57 % (ref 32–75)
PLATELET: 136 10*3/uL — ABNORMAL LOW (ref 150–400)
RBC: 2.95 M/uL — ABNORMAL LOW (ref 4.10–5.70)
RDW: 14.8 % — ABNORMAL HIGH (ref 11.5–14.5)
WBC: 2.7 10*3/uL — ABNORMAL LOW (ref 4.1–11.1)

## 2012-06-03 LAB — GLUCOSE, POC
Glucose (POC): 271 mg/dL — ABNORMAL HIGH (ref 75–110)
Glucose (POC): 99 mg/dL (ref 75–110)
Glucose (POC): 88 mg/dL (ref 75–110)

## 2012-06-03 LAB — RETICULOCYTE COUNT: Reticulocyte count: 0.4 % — ABNORMAL LOW (ref 0.7–2.1)

## 2012-06-03 LAB — C REACTIVE PROTEIN, QT: C-Reactive protein: 1.63 mg/dL — ABNORMAL HIGH (ref 0.00–0.60)

## 2012-06-03 MED ADMIN — amLODIPine (NORVASC) tablet 10 mg: ORAL | @ 12:00:00 | NDC 59762153006

## 2012-06-03 MED ADMIN — sodium chloride (NS) 0.9 % flush: @ 06:00:00 | NDC 87701099893

## 2012-06-03 MED ADMIN — diphenhydrAMINE (BENADRYL) injection 12.5 mg: INTRAVENOUS | @ 03:00:00 | NDC 00641037621

## 2012-06-03 MED ADMIN — sodium chloride (NS) flush 5-10 mL: INTRAVENOUS | @ 11:00:00 | NDC 87701099893

## 2012-06-03 MED ADMIN — HYDROmorphone (DILAUDID) injection 2 mg: INTRAVENOUS | @ 06:00:00 | NDC 00641012121

## 2012-06-03 MED ADMIN — HYDROmorphone (DILAUDID) injection 2 mg: INTRAVENOUS | @ 11:00:00 | NDC 00641012121

## 2012-06-03 MED ADMIN — HYDROmorphone (DILAUDID) injection 2 mg: INTRAVENOUS | @ 21:00:00 | NDC 00641012121

## 2012-06-03 MED ADMIN — sodium chloride (NS) flush 5-10 mL: INTRAVENOUS | @ 18:00:00 | NDC 87701099893

## 2012-06-03 MED ADMIN — diphenhydrAMINE (BENADRYL) injection 12.5 mg: INTRAVENOUS | @ 08:00:00 | NDC 00641037621

## 2012-06-03 MED ADMIN — calcium acetate (PHOSLO) tablet 1,334 mg: ORAL | @ 12:00:00 | NDC 00574011302

## 2012-06-03 MED ADMIN — lisinopril (PRINIVIL, ZESTRIL) tablet 20 mg: ORAL | @ 12:00:00 | NDC 68084006211

## 2012-06-03 MED ADMIN — diphenhydrAMINE (BENADRYL) injection 12.5 mg: INTRAVENOUS | @ 21:00:00 | NDC 00641037621

## 2012-06-03 MED ADMIN — insulin glargine (LANTUS) injection 8 Units: SUBCUTANEOUS | @ 12:00:00 | NDC 09999222001

## 2012-06-03 MED ADMIN — metoprolol (LOPRESSOR) tablet 25 mg: ORAL | @ 23:00:00 | NDC 62584026511

## 2012-06-03 MED ADMIN — heparin (porcine) injection 5,000 Units: SUBCUTANEOUS | @ 01:00:00 | NDC 25021040201

## 2012-06-03 MED ADMIN — HYDROmorphone (DILAUDID) injection 2 mg: INTRAVENOUS | @ 23:00:00 | NDC 00641012121

## 2012-06-03 MED ADMIN — HYDROmorphone (DILAUDID) injection 2 mg: INTRAVENOUS | @ 08:00:00 | NDC 00641012121

## 2012-06-03 MED ADMIN — HYDROmorphone (DILAUDID) injection 2 mg: INTRAVENOUS | @ 14:00:00 | NDC 00641012121

## 2012-06-03 MED ADMIN — sodium chloride (NS) 0.9 % flush: @ 08:00:00 | NDC 87701099893

## 2012-06-03 MED ADMIN — calcium acetate (PHOSLO) tablet 1,334 mg: ORAL | @ 17:00:00 | NDC 00574011302

## 2012-06-03 MED ADMIN — HYDROmorphone (DILAUDID) injection 2 mg: INTRAVENOUS | @ 03:00:00 | NDC 00641012121

## 2012-06-03 MED ADMIN — heparin (porcine) injection 5,000 Units: SUBCUTANEOUS | @ 17:00:00 | NDC 63323026201

## 2012-06-03 MED ADMIN — HYDROmorphone (DILAUDID) injection 2 mg: INTRAVENOUS | @ 17:00:00 | NDC 00641012121

## 2012-06-03 MED ADMIN — metoprolol (LOPRESSOR) tablet 25 mg: ORAL | @ 12:00:00 | NDC 62584026511

## 2012-06-03 MED ADMIN — calcium acetate (PHOSLO) tablet 1,334 mg: ORAL | @ 21:00:00 | NDC 00574011302

## 2012-06-03 MED ADMIN — hydrALAZINE (APRESOLINE) tablet 10 mg: ORAL | @ 08:00:00 | NDC 68084044711

## 2012-06-03 MED ADMIN — heparin (porcine) injection 5,000 Units: SUBCUTANEOUS | @ 10:00:00 | NDC 25021040201

## 2012-06-03 MED ADMIN — sodium chloride (NS) flush 5-10 mL: INTRAVENOUS | @ 03:00:00 | NDC 87701099893

## 2012-06-03 MED ADMIN — HYDROmorphone (DILAUDID) injection 2 mg: INTRAVENOUS | @ 19:00:00 | NDC 00641012121

## 2012-06-03 MED ADMIN — diphenhydrAMINE (BENADRYL) injection 12.5 mg: INTRAVENOUS | @ 14:00:00 | NDC 00641037621

## 2012-06-03 MED FILL — AMLODIPINE 5 MG TAB: 5 mg | ORAL | Qty: 2

## 2012-06-03 MED FILL — BD POSIFLUSH NORMAL SALINE 0.9 % INJECTION SYRINGE: INTRAMUSCULAR | Qty: 20

## 2012-06-03 MED FILL — HYDROMORPHONE 2 MG/ML INJECTION SOLUTION: 2 mg/mL | INTRAMUSCULAR | Qty: 1

## 2012-06-03 MED FILL — CALCIUM ACETATE 667 MG TAB: 667 mg | ORAL | Qty: 2

## 2012-06-03 MED FILL — DIPHENHYDRAMINE HCL 50 MG/ML IJ SOLN: 50 mg/mL | INTRAMUSCULAR | Qty: 1

## 2012-06-03 MED FILL — NEPHROCAPS 1 MG CAPSULE: 1 mg | ORAL | Qty: 1

## 2012-06-03 MED FILL — HEPARIN (PORCINE) 5,000 UNIT/ML IJ SOLN: 5000 unit/mL | INTRAMUSCULAR | Qty: 1

## 2012-06-03 MED FILL — NICOTINE 14 MG/24 HR DAILY PATCH: 14 mg/24 hr | TRANSDERMAL | Qty: 1

## 2012-06-03 MED FILL — HYDRALAZINE 10 MG TAB: 10 mg | ORAL | Qty: 1

## 2012-06-03 MED FILL — BD POSIFLUSH NORMAL SALINE 0.9 % INJECTION SYRINGE: INTRAMUSCULAR | Qty: 10

## 2012-06-03 MED FILL — METOPROLOL TARTRATE 25 MG TAB: 25 mg | ORAL | Qty: 1

## 2012-06-03 MED FILL — INSULIN GLARGINE 100 UNIT/ML INJECTION: 100 unit/mL | SUBCUTANEOUS | Qty: 0.08

## 2012-06-03 MED FILL — LISINOPRIL 20 MG TAB: 20 mg | ORAL | Qty: 1

## 2012-06-03 NOTE — Progress Notes (Signed)
Bedside shift change report given to Jean RN (oncoming nurse) by Esther RN (offgoing nurse).  Report given with SBAR, Kardex, Intake/Output and MAR.

## 2012-06-03 NOTE — Progress Notes (Signed)
Pt given prn med for elevated bp and also medicated for pain for mews of 3. Will continue to monitor.

## 2012-06-03 NOTE — Progress Notes (Signed)
Hospitalist Progress Note         Dionisio Paschal, MD   Romilda Joy [phone]  Call physician on-call through the operator 7pm-7am    Daily Progress Note: 06/03/2012    PCP- Phys Other, MD  Assessment/Plan:   1. LLB pneumonia, poa- On zosyn and Levaquin. Zosyn stopped per ID, Follow Cx , no growth thus far,completed levaquin course  2. Sickle cell crisis, poa -  encourge po hydration, 02 as needed . Continue Hydrea.To follow closely and adjust as appropriate, added zofran for nausea, pt off pca ,not helping , started on iv prn , increased to q 2 hrs   3. ESRD on HD - HD as per nephrology   4. Anemia - Hb 7.5-7.9 , transfuse 1 unit 6/12 for hb on 7.2 with HD  But clotted, hematology f/up , cont to monitor, 1 unit  on 6/13,and repeat   5. Hyponatremia - monitor, stable    HTN- BP consistently higher, , pain contributing ? ,increased norvasc to 10 , still not better controlled , pain contributing , increased metoprolol to 25 mg during this hosp , now in acute pain , BP in 200s , pain  control and reassess  6. DM-  lantus, ssi  , had hypoglycemia 6/12 as pt hadn't eaten due to HD  7. Tobaco abuse - nicotine, counseled   8. Chronic pain - pain management as per hematology   9. DVT prophylaxis- heparin  10. Plan- Home on po abx soon once pain controlled       Subjective:   C/o pain in leg 10/10     Review of Systems:   A comprehensive review of systems was negative except for that written in the HPI.    Objective:   Physical Exam:     BP 200/98   Pulse 86   Temp 98.5 ??F (36.9 ??C)   Resp 16   Ht 5\' 5"  (1.651 m)   Wt 203 lb 4.2 oz   BMI 33.82 kg/m2   SpO2 100% O2 Flow Rate (L/min): 2 l/min O2 Device: Room air    Temp (24hrs), Avg:98.5 ??F (36.9 ??C), Min:98.3 ??F (36.8 ??C), Max:98.9 ??F (37.2 ??C)        06/14 1900 - 06/16 0659  In: 640 [P.O.:640]  Out: -     General: Well nourished, calm, appropriate, NAD  ambulatory  HEENT: PERRLA bilaterally, neck supple, no JVD, no thyromegaly, no lymphadenopathy   CVS: RRR, no  murmurs, rubs or gallops   Lungs: diminished left base otherwise clear to auscultation, no wheezing, rales or rhonchi   GI: soft nontender, NABS. No rebound or guarding   EXT: 2+ pulses bilaterally, trace edema, fistula right arm   Neuro: Cranial nerves intact, strength 5/5 in all 4 extremities, no gross focal neuro deficits, AAO X3   Skin: intact   Psych: normal affect    Data Review:       Recent Days:  No results found for this basename: WBC:3,HGB:3,HCT:3,PLT:3 in the last 72 hours  No results found for this basename: NA:3,K:3,CL:3,CO2:3,GLU:3,BUN:3,CREA:3,CA:3,MG:3,PHOS:3,ALB:3,TBIL:3,SGOT:3,ALT:3,INR:3 in the last 72 hours  No results found for this basename: PH:3,PCO2:3,PO2:3,HCO3:3,FIO2:3 in the last 72 hours    24 Hour Results:  Recent Results (from the past 24 hour(s))   GLUCOSE, POC    Collection Time    06/02/12 12:11 PM       Component Value Range    POC GLUCOSE 130 (*) 75 - 110 mg/dL    Performed by Marca Ancona  GLUCOSE, POC    Collection Time    06/02/12  4:43 PM       Component Value Range    POC GLUCOSE 84  75 - 110 mg/dL    Performed by GRUBBS JEAN     GLUCOSE, POC    Collection Time    06/02/12  9:29 PM       Component Value Range    POC GLUCOSE 271 (*) 75 - 110 mg/dL    Performed by Dow Adolph     GLUCOSE, POC    Collection Time    06-22-2012  7:22 AM       Component Value Range    POC GLUCOSE 99  75 - 110 mg/dL    Performed by PETTIS ERNESTINE         Problem List:  Problem List as of Jun 22, 2012  Never Reviewed      Codes Class Noted - Resolved    *Sickle cell crisis 282.62  05/24/2012 - Present              Medications reviewed  Current Facility-Administered Medications   Medication Dose Route Frequency   ??? sodium chloride (NS) 0.9 % flush       ??? sodium chloride (NS) 0.9 % flush       ??? metoprolol (LOPRESSOR) tablet 25 mg  25 mg Oral BID   ??? HYDROmorphone (DILAUDID) injection 2 mg  2 mg IntraVENous Q3H PRN   ??? lisinopril (PRINIVIL, ZESTRIL) tablet 20 mg  20 mg Oral DAILY   ??? 0.9% sodium chloride  infusion  25 mL/hr IntraVENous CONTINUOUS   ??? acetaminophen (TYLENOL) tablet 650 mg  650 mg Oral Q4H PRN   ??? amLODIPine (NORVASC) tablet 10 mg  10 mg Oral DAILY   ??? metoclopramide HCl (REGLAN) injection 10 mg  10 mg IntraVENous Q6H PRN   ??? hydrALAZINE (APRESOLINE) tablet 10 mg  10 mg Oral Q6H PRN   ??? insulin lispro (HUMALOG) injection   SubCUTAneous TIDAC   ??? calcium acetate (PHOSLO) tablet 1,334 mg  2 Tab Oral TID WITH MEALS   ??? sodium chloride (NS) flush 5-10 mL  5-10 mL IntraVENous Q8H   ??? sodium chloride (NS) flush 5-10 mL  5-10 mL IntraVENous PRN   ??? heparin (porcine) injection 5,000 Units  5,000 Units SubCUTAneous Q8H   ??? 0.9% sodium chloride infusion  75 mL/hr IntraVENous CONTINUOUS   ??? B complex-vitaminC-folic acid (NEPHROCAP) cap  1 Cap Oral DAILY   ??? diphenhydrAMINE (BENADRYL) injection 12.5 mg  12.5 mg IntraVENous Q6H PRN   ??? nicotine (NICODERM CQ) 14 mg/24 hr patch 1 Patch  1 Patch TransDERmal Q24H   ??? insulin glargine (LANTUS) injection 8 Units  8 Units SubCUTAneous DAILY   ??? glucose chewable tablet 16 g  4 Tab Oral PRN   ??? dextrose (D50W) injection Syrg 12.5-25 g  12.5-25 g IntraVENous PRN   ??? glucagon (GLUCAGEN) injection 1 mg  1 mg IntraMUSCular PRN       Care Plan discussed with: Patient/Family and Nurse    Total time spent with patient: 30 minutes.    Ah Bott Karren Cobble, MD

## 2012-06-03 NOTE — Progress Notes (Signed)
Hematology Oncology Progress Note    Follow up for: sickle cell crisis    Chart notes reviewed since last visit.    Case discussed with following: patient    Patient complains of the following: pain is doing better - current pain medication regimen adequate.     Additional concerns noted by the staff:     Patient Vitals for the past 24 hrs:   BP Temp Pulse Resp SpO2   06/03/12 0818 200/98 mmHg - 86  - -   06/03/12 0545 164/92 mmHg - 71  - -   06/03/12 0339 183/96 mmHg 98.5 ??F (36.9 ??C) 77  16  100 %   06/02/12 2322 161/82 mmHg 98.3 ??F (36.8 ??C) 75  18  95 %   06/02/12 2027 154/84 mmHg 98.3 ??F (36.8 ??C) 77  18  97 %   06/02/12 1659 175/94 mmHg 98.9 ??F (37.2 ??C) 79  18  98 %   06/02/12 1150 160/89 mmHg 98.7 ??F (37.1 ??C) 78  18  96 %     ROS - negative for 12 organ systems    Physical Examination:  Constitutional Alert, cooperative, oriented. Mood and affect appropriate. Appears close to chronological age. Well nourished. Well developed.   Head Normocephalic; no scars   Eyes Conjunctivae and sclerae are clear and without icterus. Pupils are reactive and equal.   ENMT Sinuses are nontender.  No oral exudates, ulcers, masses, thrush or mucositis. Oropharynx clear.  Tongue normal.   Neck Supple without masses or thyromegaly. No jugular venous distension.   Hematologic/Lymphatic No petechiae or purpura.  No tender or palpable lymph nodes noted   Respiratory Lungs are clear to auscultation without rhonchi or wheezing.   Cardiovascular Regular rate and rhythm of heart without murmurs, gallops or rubs.   Chest / Line Site Chest is symmetric with no chest wall deformities.   Abdomen Non-tender, non-distended, no masses, ascites or hepatosplenomegaly. Good bowel sounds. No guarding or rebound tenderness. No pulsatile masses.   Musculoskeletal No tenderness or swelling, normal range of motion without obvious weakness.   Extremities No visible deformities, no cyanosis, clubbing or edema.    Skin No rashes, scars, or lesions  suggestive of malignancy. No petechiae, purpura, or ecchymoses. No excoriations.    Neurologic No sensory or motor deficits noted grossly   Psychiatric Alert and oriented.       Labs:  Recent Results (from the past 24 hour(s))   GLUCOSE, POC    Collection Time    06/02/12 12:11 PM       Component Value Range    POC GLUCOSE 130 (*) 75 - 110 mg/dL    Performed by GRUBBS JEAN     GLUCOSE, POC    Collection Time    06/02/12  4:43 PM       Component Value Range    POC GLUCOSE 84  75 - 110 mg/dL    Performed by GRUBBS JEAN     GLUCOSE, POC    Collection Time    06/02/12  9:29 PM       Component Value Range    POC GLUCOSE 271 (*) 75 - 110 mg/dL    Performed by Dow Adolph     GLUCOSE, POC    Collection Time    06/03/12  7:22 AM       Component Value Range    POC GLUCOSE 99  75 - 110 mg/dL    Performed by PETTIS ERNESTINE  Assessment and Plan: The principles of managing a pain crisis include appropriate IV hydration as well as administration of opioids, antiemetics, anti-pruritics, and possibly NSAIDs.  Typically, D5 ?? NSS at a rate of 100-150 cc/hr is given. One needs to keep in mind that sickle cell patients have hyposthenuria, such that they can not concentrate their urine and avoid dehydration if unable to maintain oral intake. One needs to be careful to avoid hyper-hydration if there is pulmonary hypertension or renal disease.   The current recommendation for oxygen is to supplement only if there is a low O2 saturation which in SS disease is defined as a saturation of less than 93%. There is no benefit to additional oxygen if the patient is not hypoxemic. One does need to watch for nocturnal desaturation so if the patient is not on supplemental oxygen, an O2 saturation should be checked periodically at night.   Recommended doses of opioids for management of pain crisis for those not chronically on opioids are morphine 5-10 mg and hydromoprhone 1-2 mg with higher doses if opioid tolerant. The initial doses are  repeated q 15 minutes until good pain relief is achieved but the patient is not overly sedated. Subsequent doses are given every 2-4 hours. Delivery via a patient controlled analgesia pump may be more efficacious and efficient. For an opioid na??ve patient, recommended doses for hydromorphone is 0.2-0.5 mg/hr and prn and for morphine is 1-4 mg/hr and prn with 6-10 minute lockouts and with monitoring of VS, O2 saturation, and pain score. Higher doses are used in the opioid tolerant.  Meperidine is to be avoided to avoid seizures.  Nausea is a common side effect of opioids. Phenergan, prochloroperazine, and ondansetron are ordered to assist in relief of nausea. Opioids also cause histamine release from mast cells. Diphenhydramine 25-50 mg q 4-6 hours either PO or IV may be used to relieve itch.  Hydroxyzine (vistaril) may also prove useful and for intractable itch, continuous infusion naloxone 0.25 ??? 1 mcg/kg/hr as a drip is highly effective but is too low a dose to interfere with pain relief.  Constipation also needs to be addressed and stool softeners and/or laxatives need to be prescribed. NSAIDs are contraindicated in the setting of pulmonary hypertension or renal insufficiency but is a useful adjunct for pain relief. Oral ibuprofen or toradol are commonly used for this purpose.   Incentive spirometry q 2 hours has been shown to reduce the risk of acute chest syndrome. Ambulation also decreases the risk of acute chest syndrome and he should be encouraged to walk.     He has daily CBC ordered but has not been checked in several days. Have reinforced with floor staff to get this drawn.

## 2012-06-04 LAB — GLUCOSE, POC
Glucose (POC): 33 mg/dL — CL (ref 75–110)
Glucose (POC): 40 mg/dL — CL (ref 75–110)
Glucose (POC): 63 mg/dL — ABNORMAL LOW (ref 75–110)
Glucose (POC): 79 mg/dL (ref 75–110)
Glucose (POC): 98 mg/dL (ref 75–110)
Glucose (POC): 103 mg/dL (ref 75–110)
Glucose (POC): 55 mg/dL — ABNORMAL LOW (ref 75–110)
Glucose (POC): 63 mg/dL — ABNORMAL LOW (ref 75–110)
Glucose (POC): 64 mg/dL — ABNORMAL LOW (ref 75–110)
Glucose (POC): 65 mg/dL — ABNORMAL LOW (ref 75–110)
Glucose (POC): 62 mg/dL — ABNORMAL LOW (ref 75–110)
Glucose (POC): 93 mg/dL (ref 75–110)
Glucose (POC): 57 mg/dL — ABNORMAL LOW (ref 75–110)
Glucose (POC): 204 mg/dL — ABNORMAL HIGH (ref 75–110)
Glucose (POC): 77 mg/dL (ref 75–110)
Glucose (POC): 86 mg/dL (ref 75–110)
Glucose (POC): 70 mg/dL — ABNORMAL LOW (ref 75–110)
Glucose (POC): 87 mg/dL (ref 75–110)
Glucose (POC): 131 mg/dL — ABNORMAL HIGH (ref 75–110)
Glucose (POC): 125 mg/dL — ABNORMAL HIGH (ref 75–110)

## 2012-06-04 LAB — CBC W/O DIFF
HCT: 25.9 % — ABNORMAL LOW (ref 36.6–50.3)
HGB: 8.4 g/dL — ABNORMAL LOW (ref 12.1–17.0)
MCH: 30.5 PG (ref 26.0–34.0)
MCHC: 32.4 g/dL (ref 30.0–36.5)
MCV: 94.2 FL (ref 80.0–99.0)
PLATELET: 141 10*3/uL — ABNORMAL LOW (ref 150–400)
RBC: 2.75 M/uL — ABNORMAL LOW (ref 4.10–5.70)
RDW: 14.7 % — ABNORMAL HIGH (ref 11.5–14.5)
WBC: 3.7 10*3/uL — ABNORMAL LOW (ref 4.1–11.1)

## 2012-06-04 MED ADMIN — HYDROmorphone (DILAUDID) injection 2 mg: INTRAVENOUS | @ 11:00:00 | NDC 00641012121

## 2012-06-04 MED ADMIN — metoprolol (LOPRESSOR) tablet 25 mg: ORAL | @ 13:00:00 | NDC 62584026511

## 2012-06-04 MED ADMIN — sodium chloride (NS) flush 5-10 mL: INTRAVENOUS | @ 14:00:00 | NDC 87701099893

## 2012-06-04 MED ADMIN — calcium acetate (PHOSLO) tablet 1,334 mg: ORAL | @ 07:00:00 | NDC 00574011302

## 2012-06-04 MED ADMIN — HYDROmorphone (DILAUDID) injection 3 mg: INTRAVENOUS | @ 23:00:00 | NDC 00641012121

## 2012-06-04 MED ADMIN — HYDROmorphone (DILAUDID) injection 2 mg: INTRAVENOUS | @ 07:00:00 | NDC 00641012121

## 2012-06-04 MED ADMIN — HYDROmorphone (DILAUDID) injection 3 mg: INTRAVENOUS | @ 19:00:00 | NDC 00641012121

## 2012-06-04 MED ADMIN — diphenhydrAMINE (BENADRYL) injection 12.5 mg: INTRAVENOUS | @ 14:00:00 | NDC 00641037621

## 2012-06-04 MED ADMIN — HYDROmorphone (DILAUDID) injection 2 mg: INTRAVENOUS | @ 16:00:00 | NDC 00641012121

## 2012-06-04 MED ADMIN — diphenhydrAMINE (BENADRYL) injection 12.5 mg: INTRAVENOUS | @ 03:00:00 | NDC 00641037621

## 2012-06-04 MED ADMIN — sodium chloride (NS) flush 5-10 mL: INTRAVENOUS | @ 16:00:00 | NDC 87701099893

## 2012-06-04 MED ADMIN — hydrALAZINE (APRESOLINE) tablet 10 mg: ORAL | @ 09:00:00 | NDC 68084044711

## 2012-06-04 MED ADMIN — HYDROmorphone (DILAUDID) injection 2 mg: INTRAVENOUS | @ 14:00:00 | NDC 00641012121

## 2012-06-04 MED ADMIN — amLODIPine (NORVASC) tablet 10 mg: ORAL | @ 13:00:00 | NDC 59762153006

## 2012-06-04 MED ADMIN — diphenhydrAMINE (BENADRYL) injection 12.5 mg: INTRAVENOUS | @ 20:00:00 | NDC 00641037621

## 2012-06-04 MED ADMIN — hydrALAZINE (APRESOLINE) tablet 10 mg: ORAL | @ 19:00:00 | NDC 68084044711

## 2012-06-04 MED ADMIN — diphenhydrAMINE (BENADRYL) injection 12.5 mg: INTRAVENOUS | @ 09:00:00 | NDC 00641037621

## 2012-06-04 MED ADMIN — calcium acetate (PHOSLO) tablet 1,334 mg: ORAL | @ 19:00:00 | NDC 00574011302

## 2012-06-04 MED ADMIN — calcium acetate (PHOSLO) tablet 1,334 mg: ORAL | @ 22:00:00 | NDC 00574011302

## 2012-06-04 MED ADMIN — sodium chloride (NS) flush 5-10 mL: INTRAVENOUS | @ 19:00:00 | NDC 87701099893

## 2012-06-04 MED ADMIN — HYDROmorphone (DILAUDID) injection 2 mg: INTRAVENOUS | @ 01:00:00 | NDC 00641012121

## 2012-06-04 MED ADMIN — HYDROmorphone (DILAUDID) injection 3 mg: INTRAVENOUS | @ 20:00:00 | NDC 00641012121

## 2012-06-04 MED ADMIN — HYDROmorphone (DILAUDID) injection 2 mg: INTRAVENOUS | @ 13:00:00 | NDC 00641012121

## 2012-06-04 MED ADMIN — sodium chloride (NS) flush 5-10 mL: INTRAVENOUS | @ 13:00:00 | NDC 87701099893

## 2012-06-04 MED ADMIN — HYDROmorphone (DILAUDID) injection 2 mg: INTRAVENOUS | @ 03:00:00 | NDC 00641012121

## 2012-06-04 MED ADMIN — heparin (porcine) injection 5,000 Units: SUBCUTANEOUS | @ 12:00:00 | NDC 25021040201

## 2012-06-04 MED ADMIN — glucose chewable tablet 16 g: ORAL | @ 18:00:00

## 2012-06-04 MED ADMIN — HYDROmorphone (DILAUDID) injection 2 mg: INTRAVENOUS | @ 09:00:00 | NDC 00641012121

## 2012-06-04 MED ADMIN — sodium chloride (NS) flush 5-10 mL: INTRAVENOUS | @ 02:00:00 | NDC 82903065462

## 2012-06-04 MED ADMIN — hydrALAZINE (APRESOLINE) tablet 10 mg: ORAL | @ 01:00:00 | NDC 68084044711

## 2012-06-04 MED ADMIN — lisinopril (PRINIVIL, ZESTRIL) tablet 20 mg: ORAL | @ 13:00:00 | NDC 68084006211

## 2012-06-04 MED ADMIN — sodium chloride (NS) flush 5-10 mL: INTRAVENOUS | @ 10:00:00 | NDC 82903065462

## 2012-06-04 MED ADMIN — metoprolol (LOPRESSOR) tablet 50 mg: ORAL | @ 22:00:00 | NDC 62584026611

## 2012-06-04 MED ADMIN — HYDROmorphone (DILAUDID) injection 2 mg: INTRAVENOUS | @ 05:00:00 | NDC 00641012121

## 2012-06-04 MED ADMIN — heparin (porcine) injection 5,000 Units: SUBCUTANEOUS | @ 03:00:00 | NDC 25021040201

## 2012-06-04 MED ADMIN — heparin (porcine) injection 5,000 Units: SUBCUTANEOUS | @ 19:00:00 | NDC 25021040201

## 2012-06-04 MED FILL — HYDROMORPHONE 2 MG/ML INJECTION SOLUTION: 2 mg/mL | INTRAMUSCULAR | Qty: 1

## 2012-06-04 MED FILL — AMLODIPINE 5 MG TAB: 5 mg | ORAL | Qty: 2

## 2012-06-04 MED FILL — LISINOPRIL 20 MG TAB: 20 mg | ORAL | Qty: 1

## 2012-06-04 MED FILL — HYDROMORPHONE 2 MG/ML INJECTION SOLUTION: 2 mg/mL | INTRAMUSCULAR | Qty: 2

## 2012-06-04 MED FILL — DIPHENHYDRAMINE HCL 50 MG/ML IJ SOLN: 50 mg/mL | INTRAMUSCULAR | Qty: 1

## 2012-06-04 MED FILL — BD POSIFLUSH NORMAL SALINE 0.9 % INJECTION SYRINGE: INTRAMUSCULAR | Qty: 10

## 2012-06-04 MED FILL — GLUCOSE 4 GRAM CHEWABLE TAB: 4 gram | ORAL | Qty: 10

## 2012-06-04 MED FILL — NICOTINE 14 MG/24 HR DAILY PATCH: 14 mg/24 hr | TRANSDERMAL | Qty: 1

## 2012-06-04 MED FILL — METOPROLOL TARTRATE 25 MG TAB: 25 mg | ORAL | Qty: 1

## 2012-06-04 MED FILL — CALCIUM ACETATE 667 MG TAB: 667 mg | ORAL | Qty: 2

## 2012-06-04 MED FILL — INSULIN GLARGINE 100 UNIT/ML INJECTION: 100 unit/mL | SUBCUTANEOUS | Qty: 0.05

## 2012-06-04 MED FILL — HEPARIN (PORCINE) 5,000 UNIT/ML IJ SOLN: 5000 unit/mL | INTRAMUSCULAR | Qty: 1

## 2012-06-04 MED FILL — HYDRALAZINE 10 MG TAB: 10 mg | ORAL | Qty: 1

## 2012-06-04 MED FILL — NEPHROCAPS 1 MG CAPSULE: 1 mg | ORAL | Qty: 1

## 2012-06-04 MED FILL — BD POSIFLUSH NORMAL SALINE 0.9 % INJECTION SYRINGE: INTRAMUSCULAR | Qty: 20

## 2012-06-04 MED FILL — METOPROLOL TARTRATE 50 MG TAB: 50 mg | ORAL | Qty: 1

## 2012-06-04 NOTE — Progress Notes (Addendum)
[name] blood glucose reported to be 70. Pt is asymptomatic.  Cranberry juice offered. Pt advised that he does not want to drink the juice, as she is trying to have "fluid pulled off".   Glucose tabs offered to patient (see MAR). Pt advised that he will only eat 2 tabs, as he will be going back to his room to eat lunch soon.    Blood glucose will be rechecked in approximately 10 minutes.    1356- [name] blood glucose is now 87. Pt taken back to room 537.

## 2012-06-04 NOTE — Progress Notes (Addendum)
Followup for ESRD.  Seen on dialysis. Something of a "pained" - rather than "in pain" - facial expression.  No new c/o. Persistent pain "since I've been here," with a report that it usually takes a week and a half to settle a pain crisis.    Chest clear. No jvd. Regular heart. Little peripheral edema. Access performing OK.     Dialysis support.We discussed recent hypertension, and his usual meds - he agrees that "probably so" extra metoprolol may help.    LABS:   Recent Results (from the past 24 hour(s))   GLUCOSE, POC    Collection Time    06/03/12 11:21 AM       Component Value Range    POC GLUCOSE 88  75 - 110 mg/dL    Performed by Huey Romans     CBC WITH AUTOMATED DIFF    Collection Time    06/03/12  1:40 PM       Component Value Range    WBC 2.7 (*) 4.1 - 11.1 K/uL    RBC 2.95 (*) 4.10 - 5.70 M/uL    HGB 8.9 (*) 12.1 - 17.0 g/dL    HCT 16.1 (*) 09.6 - 50.3 %    MCV 94.2  80.0 - 99.0 FL    MCH 30.2  26.0 - 34.0 PG    MCHC 32.0  30.0 - 36.5 g/dL    RDW 04.5 (*) 40.9 - 14.5 %    PLATELET 136 (*) 150 - 400 K/uL    NEUTROPHILS 57  32 - 75 %    LYMPHOCYTES 31  12 - 49 %    MONOCYTES 9  5 - 13 %    EOSINOPHILS 3  0 - 7 %    BASOPHILS 0  0 - 1 %    ABS. NEUTROPHILS 1.6 (*) 1.8 - 8.0 K/UL    ABS. LYMPHOCYTES 0.8  0.8 - 3.5 K/UL    ABS. MONOCYTES 0.2  0.0 - 1.0 K/UL    ABS. EOSINOPHILS 0.1  0.0 - 0.4 K/UL    ABS. BASOPHILS 0.0  0.0 - 0.1 K/UL    RBC COMMENTS NORMOCYTIC, NORMOCHROMIC      DF MANUAL     RETICULOCYTE COUNT    Collection Time    06/03/12  1:40 PM       Component Value Range    Reticulocyte count 0.4 (*) 0.7 - 2.1 %   C REACTIVE PROTEIN, QT    Collection Time    06/03/12  1:40 PM       Component Value Range    C-Reactive protein 1.63 (*) 0.00 - 0.60 mg/dL   GLUCOSE, POC    Collection Time    06/03/12  5:12 PM       Component Value Range    POC GLUCOSE 33 (*) 75 - 110 mg/dL    Performed by GRUBBS JEAN     GLUCOSE, POC    Collection Time    06/03/12  5:15 PM       Component Value Range    POC GLUCOSE 40 (*) 75 -  110 mg/dL    Performed by GRUBBS JEAN     GLUCOSE, POC    Collection Time    06/03/12  5:49 PM       Component Value Range    POC GLUCOSE 63 (*) 75 - 110 mg/dL    Performed by GRUBBS JEAN     GLUCOSE, POC    Collection Time  06/03/12  6:12 PM       Component Value Range    POC GLUCOSE 79  75 - 110 mg/dL    Performed by Cottie Banda     GLUCOSE, POC    Collection Time    06/03/12  6:37 PM       Component Value Range    POC GLUCOSE 98  75 - 110 mg/dL    Performed by GRUBBS JEAN     GLUCOSE, POC    Collection Time    06/03/12 10:08 PM       Component Value Range    POC GLUCOSE 55 (*) 75 - 110 mg/dL    Performed by Caryl Comes DON     GLUCOSE, POC    Collection Time    06/03/12 10:42 PM       Component Value Range    POC GLUCOSE 63 (*) 75 - 110 mg/dL    Performed by Caryl Comes DON     GLUCOSE, POC    Collection Time    06/03/12 11:16 PM       Component Value Range    POC GLUCOSE 103  75 - 110 mg/dL    Performed by LANGE DON     GLUCOSE, POC    Collection Time    06/04/12  6:39 AM       Component Value Range    POC GLUCOSE 64 (*) 75 - 110 mg/dL    Performed by LANGE DON     GLUCOSE, POC    Collection Time    06/04/12  7:09 AM       Component Value Range    POC GLUCOSE 65 (*) 75 - 110 mg/dL    Performed by Caryl Comes DON     GLUCOSE, POC    Collection Time    06/04/12  7:53 AM       Component Value Range    POC GLUCOSE 62 (*) 75 - 110 mg/dL    Performed by LANGE DON     GLUCOSE, POC    Collection Time    06/04/12  8:35 AM       Component Value Range    POC GLUCOSE 93  75 - 110 mg/dL    Performed by [facility]  AMY         Patient Vitals for the past 12 hrs:   Temp Pulse Resp BP SpO2   06/04/12 0845 98 ??F (36.7 ??C) 90  18  195/99 mmHg 96 %   06/04/12 0800 - - - - 98 %   06/04/12 0447 98.9 ??F (37.2 ??C) 81  18  191/101 mmHg -   06/04/12 0042 99.4 ??F (37.4 ??C) 76  18  191/94 mmHg -

## 2012-06-04 NOTE — Procedures (Signed)
[facility]                         [phone]  Vitals Pre Post Assessment Pre Post   BP 189/97 157/86 LOC Alert,orientedx3. Alert,orientedx3.   HR 83 87 Lungs CTA CTA   Temp 98.0 98.2 Cardiac RRR RRR   Resp 18 18 Skin Warm,very dry. Warm,very dry.   Weight 97.8kg 95.2kg Edema 2+ both legs and ankles. 2+ both legs and ankles.      Pain No complain. No complain.     Orders   Duration: Start: 0950 End: 1320 Total: 3.5hours.   Dialyzer: Revaclear.   K Bath: 3   Ca Bath: 2   Na / Bicarb: 140/40   Target Fluid Removal: 3000 ml ( including ns rinses).     Access   Type & Location: Right upper arm avf.   Comments:    Avf cannulated with G15 needles without difficulty. Avf had good blood flow anf no signs of infection noted. Small amount of bleeding from both needle sites post dialysis.  Pressure dressings applied after bleeding had stopped.                            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   Obtained/Reviewed  Critical Results Called Obtained/reviewed.  Dr. Griselda Miner.     Meds Given   Name Dose Route   None.                 Total Liters Process: 77.4   Net Fluid Removed: 2069 ml.      Comments   Time Out Done: Yes/ 0930   Primary Nurse Rpt Pre: Durene Cal, RN.   Primary Nurse Rpt Post: Lavonda Jumbo, RN.   Pt Education: None.   Tx Summary: Tolerated dialysis fairly well. Medicated by floor rn twice,during dialysis for complain of pain. Dialysis terminated 30 minutes early due to system clotting. Unable to return blood.Dr. Griselda Miner notified. Returned to his room in nad.

## 2012-06-04 NOTE — Progress Notes (Signed)
Bedside shift change report given to [place] (Cabin crew) by Linton Rump (offgoing nurse).  Report given with SBAR and MAR.

## 2012-06-04 NOTE — Other (Signed)
DTC Progress Note    Recommendations/ Comments:       Noted hypoglycemia frequently in the last 24 hours.  Lantus held this morning and dose decreased to 5 units today.  Will continue to monitor today for any more hypoglycemia. Likely related to needing dialysis which is occuring this morning.     Chart reviewed on [employer].    Patient is a 31 y.o. male with Type 1 Diabetes on insulin injections: Lantus : 8 units daily, Novolog 160-180 = 1 unit, 180-200 = 2 units, 200+ = 30 units at home.    POC Glucose last 24hrs:   Lab Results   Component Value Date/Time    POC GLUCOSE 93 06/04/2012  8:35 AM    POC GLUCOSE 62 06/04/2012  7:53 AM    POC GLUCOSE 65 06/04/2012  7:09 AM    POC GLUCOSE 64 06/04/2012  6:39 AM    POC GLUCOSE 103 06/03/2012 11:16 PM    POC GLUCOSE 63 06/03/2012 10:42 PM    POC GLUCOSE 55 06/03/2012 10:08 PM    POC GLUCOSE 98 06/03/2012  6:37 PM         Lab Results   Component Value Date/Time    Creatinine 4.85 05/31/2012  3:55 AM       PO intake: No data found.        Current hospital DM medications: Humalog High Sensitivity AC & HS. Lantus 5 units daily    Will continue to follow as needed.    Thank you.  M. Dawn Aron Baba BSN, RN  [facility]  Pager: [phone]

## 2012-06-04 NOTE — Progress Notes (Signed)
Hematology Oncology Progress Note    Follow up for sickle crisis.    Chart notes reviewed since last visit.    He complains of poorly controlled pain in legs, back. Didn't sleep last night.    Vital signs from last 24 hours reviewed.    Review of Systems:  Pertinent items are noted in HPI.    Physical Examination: chest clear, cor rrr, abdomen soft & nontender.    Laboratory & radiographic results from last 24 hours reviewed.    Assessment & Plan:    Sickle crisis. I will increase the dilaudid dose to 3 mg IV q2h.

## 2012-06-04 NOTE — Procedures (Signed)
Procedures  by Coralee Pesa, RN at 06/04/12 1536                Author: Coralee Pesa, RN  Service: --  Author Type: Registered Nurse       Filed: 06/04/12 1553  Date of Service: 06/04/12 1536  Status: Signed          Editor: Dimain, Milana Obey, RN (Registered Nurse)            Procedures        1. HEMODIALYSIS INPATIENT [DIA12 (Custom)]                                                  [facility]                         [phone]          Vitals  Pre  Post  Assessment  Pre  Post            BP  189/97  157/86  LOC  Alert,orientedx3.  Alert,orientedx3.            HR  83  87  Lungs  CTA  CTA            Temp  98.0  98.2  Cardiac  RRR  RRR     Resp  18  18  Skin  Warm,very dry.  Warm,very dry.     Weight  97.8kg  95.2kg  Edema  2+ both legs and ankles.  2+ both legs and ankles.                  Pain  No complain.  No complain.          Orders             Duration:  Start:  0950  End:  1320  Total:  3.5hours.        Dialyzer:  Revaclear.     K Bath:  3     Ca Bath:  2     Na / Bicarb:  140/40        Target Fluid Removal:  3000 ml ( including ns rinses).          Access        Type & Location:  Right upper arm avf.       Comments:     Avf cannulated with G15 needles without difficulty. Avf had good blood flow anf no signs of infection noted. Small amount of bleeding from both needle sites post dialysis.   Pressure dressings applied after bleeding had stopped.                                 Labs        Hep B status / date:  sAB positive/ 01/24/12.        Obtained/Reviewed   Critical Results Called  Obtained/reviewed.   Dr. Griselda Miner.          Meds Given         Name  Dose  Route         None.  Total Liters Process:  77.4        Net Fluid Removed:  2069 ml.           Comments        Time Out Done:  Yes/ 0930     Primary Nurse Rpt Pre:  Durene Cal, RN.     Primary Nurse Rpt Post:  Lavonda Jumbo, RN.        Pt Education:  None.        Tx Summary:  Tolerated  dialysis fairly well. Medicated by floor rn twice,during dialysis for complain of pain. Dialysis terminated 30 minutes early  due to system clotting. Unable to return blood.Dr. Griselda Miner notified. Returned to his room in nad.

## 2012-06-04 NOTE — Progress Notes (Signed)
Hospitalist Progress Note         Glori Luis MD.  Call physician on-call through the operator 7pm-7am    Daily Progress Note: 06/04/2012    Assessment/Plan:     Assessment/Plan:      1. LLB pneumonia, poa- On zosyn and Levaquin. Zosyn stopped per ID, Follow Cx , no growth thus far,completed levaquin course  2. Sickle cell crisis, poa - encourge po hydration, 02 as needed . Continue Hydrea.To follow closely and adjust as appropriate, added zofran for nausea, pt off pca ,not helping , started on iv prn , increased to q 2 hrs   3. ESRD on HD - HD as per nephrology   4. Anemia - Hb 7.5-7.9 , transfuse 1 unit 6/12 for hb on 7.2 with HD But clotted, hematology f/up , cont to monitor, 1 unit on 6/13,and repeat .awiting transfusion today 06/04/12.  5. Hyponatremia - monitor, stable   HTN- BP consistently higher, , pain contributing ? ,increased norvasc to 10 , still not better controlled , pain contributing , increased metoprolol to 25 mg during this hosp , now in acute pain , BP in 200s , pain control and reassess   6. DM- lantus, ssi , had hypoglycemia 6/12 as pt hadn't eaten due to HD  7. Tobaco abuse - nicotine, counseled   8. Chronic pain - pain management as per hematology   9. Thrombocytopenia: watch closely.  10. DVT prophylaxis- heparin  11. Plan- Home on po abx soon once pain controlled   12. Case d/w patient , addressed pain & other issues to full satisfaction.           Subjective:   The patient is CO PAIN LAST NIGHT 10/10 , NOW 6/10. AWIATING PRBC TRANSFUSION.     Review of Systems:   A comprehensive review of systems was negative.    Objective:   Physical Exam:     BP 162/88   Pulse 82   Temp 98.5 ??F (36.9 ??C)   Resp 16   Ht 5\' 5"  (1.651 m)   Wt 203 lb 4.2 oz   BMI 33.82 kg/m2   SpO2 100% O2 Flow Rate (L/min): 2 l/min O2 Device: Nasal cannula    Temp (24hrs), Avg:98.8 ??F (37.1 ??C), Min:98 ??F (36.7 ??C), Max:99.4 ??F (37.4 ??C)        06/16 0700 - 06/17 1859  In: 1260  [P.O.:1260]  Out: 2069     General:  Alert, cooperative, no distress, appears stated age.   Lungs:   Clear to auscultation bilaterally.decreased breath sounds LLL, percussion dull on LLL   Chest wall:  No tenderness or deformity.   Heart:  Regular rate and rhythm, S1, S2 normal, no murmur, click, rub or gallop.   Abdomen:   Soft, non-tender. Bowel sounds normal. No masses,  No organomegaly.   Extremities: Extremities normal, atraumatic, no cyanosis or edema.PAIN ON PALPATION.   Pulses: 2+ and symmetric all extremities.Rt arm fistula.   Skin: Skin color, texture, turgor normal. No rashes or lesions   Neurologic: CNII-XII intact.      Data Review:       Recent Days:  Recent Labs   Basename 06/04/12 1030 06/03/12 1340    WBC 3.7* 2.7*    HGB 8.4* 8.9*    HCT 25.9* 27.8*    PLT 141* 136*     No results found for this basename: NA:3,K:3,CL:3,CO2:3,GLU:3,BUN:3,CREA:3,CA:3,MG:3,PHOS:3,ALB:3,TBIL:3,SGOT:3,ALT:3,INR:3 in the last 72 hours  No results found for this basename: PH:3,PCO2:3,PO2:3,HCO3:3,FIO2:3 in  the last 72 hours    24 Hour Results:  Recent Results (from the past 24 hour(s))   GLUCOSE, POC    Collection Time    06/03/12 10:08 PM       Component Value Range    POC GLUCOSE 55 (*) 75 - 110 mg/dL    Performed by Caryl Comes DON     GLUCOSE, POC    Collection Time    06/03/12 10:42 PM       Component Value Range    POC GLUCOSE 63 (*) 75 - 110 mg/dL    Performed by Caryl Comes DON     GLUCOSE, POC    Collection Time    06/03/12 11:16 PM       Component Value Range    POC GLUCOSE 103  75 - 110 mg/dL    Performed by Caryl Comes DON     GLUCOSE, POC    Collection Time    06/04/12  6:39 AM       Component Value Range    POC GLUCOSE 64 (*) 75 - 110 mg/dL    Performed by Caryl Comes DON     GLUCOSE, POC    Collection Time    06/04/12  7:09 AM       Component Value Range    POC GLUCOSE 65 (*) 75 - 110 mg/dL    Performed by Caryl Comes DON     GLUCOSE, POC    Collection Time    06/04/12  7:53 AM       Component Value Range    POC GLUCOSE 62 (*) 75 - 110 mg/dL     Performed by Caryl Comes DON     GLUCOSE, POC    Collection Time    06/04/12  8:35 AM       Component Value Range    POC GLUCOSE 93  75 - 110 mg/dL    Performed by Delumyea  AMY     CBC W/O DIFF    Collection Time    06/04/12 10:30 AM       Component Value Range    WBC 3.7 (*) 4.1 - 11.1 K/uL    RBC 2.75 (*) 4.10 - 5.70 M/uL    HGB 8.4 (*) 12.1 - 17.0 g/dL    HCT 16.1 (*) 09.6 - 50.3 %    MCV 94.2  80.0 - 99.0 FL    MCH 30.5  26.0 - 34.0 PG    MCHC 32.4  30.0 - 36.5 g/dL    RDW 04.5 (*) 40.9 - 14.5 %    PLATELET 141 (*) 150 - 400 K/uL   GLUCOSE, POC    Collection Time    06/04/12 11:57 AM       Component Value Range    POC GLUCOSE 57 (*) 75 - 110 mg/dL    Performed by Johny Shock     GLUCOSE, POC    Collection Time    06/04/12 12:19 PM       Component Value Range    POC GLUCOSE 204 (*) 75 - 110 mg/dL    Performed by Johny Shock     GLUCOSE, POC    Collection Time    06/04/12 12:21 PM       Component Value Range    POC GLUCOSE 86  75 - 110 mg/dL    Performed by Johny Shock     GLUCOSE, POC    Collection Time    06/04/12  1:11 PM  Component Value Range    POC GLUCOSE 77  75 - 110 mg/dL    Performed by Johny Shock     TYPE & CROSSMATCH    Collection Time    2012/06/29  1:45 PM       Component Value Range    Crossmatch Expiration 06/07/2012      ABO/Rh(D) B POS      Antibody screen NEG      Physician instructions [facility] NEGATIVE      Unit number [id number]      Blood component type RC LR AS5      Unit division 00      Status of unit ALLOCATED      Crossmatch result COMPATIBLE      ANTIGEN/ANTIBODY INF E NEGATIVE, KELL NEGATIVE,      Unit number [id number]      Blood component type RC LR AS5      Unit division 00      Status of unit ALLOCATED      Crossmatch result COMPATIBLE      ANTIGEN/ANTIBODY INF E NEGATIVE, KELL NEGATIVE,     GLUCOSE, POC    Collection Time    06/29/2012  1:47 PM       Component Value Range    POC GLUCOSE 70 (*) 75 - 110 mg/dL    Performed by Johny Shock     GLUCOSE, POC     Collection Time    2012-06-29  2:10 PM       Component Value Range    POC GLUCOSE 87  75 - 110 mg/dL    Performed by Johny Shock     GLUCOSE, POC    Collection Time    2012-06-29  4:21 PM       Component Value Range    POC GLUCOSE 131 (*) 75 - 110 mg/dL    Performed by Huey Romans     GLUCOSE, POC    Collection Time    June 29, 2012  5:53 PM       Component Value Range    POC GLUCOSE 125 (*) 75 - 110 mg/dL    Performed by Bolivar Haw         Problem List:  Problem List as of 2012-06-29  Never Reviewed      Codes Class Noted - Resolved    *Sickle cell crisis 282.62  05/24/2012 - Present              Medications reviewed  Current Facility-Administered Medications   Medication Dose Route Frequency   ??? sodium chloride (NS) 0.9 % flush       ??? metoprolol (LOPRESSOR) tablet 50 mg  50 mg Oral BID   ??? HYDROmorphone (DILAUDID) injection 3 mg  3 mg IntraVENous Q2H PRN   ??? insulin glargine (LANTUS) injection 5 Units  5 Units SubCUTAneous DAILY   ??? lisinopril (PRINIVIL, ZESTRIL) tablet 20 mg  20 mg Oral DAILY   ??? 0.9% sodium chloride infusion  25 mL/hr IntraVENous CONTINUOUS   ??? acetaminophen (TYLENOL) tablet 650 mg  650 mg Oral Q4H PRN   ??? amLODIPine (NORVASC) tablet 10 mg  10 mg Oral DAILY   ??? metoclopramide HCl (REGLAN) injection 10 mg  10 mg IntraVENous Q6H PRN   ??? hydrALAZINE (APRESOLINE) tablet 10 mg  10 mg Oral Q6H PRN   ??? insulin lispro (HUMALOG) injection   SubCUTAneous TIDAC   ??? calcium acetate (PHOSLO) tablet 1,334 mg  2 Tab Oral TID WITH MEALS   ???  sodium chloride (NS) flush 5-10 mL  5-10 mL IntraVENous Q8H   ??? sodium chloride (NS) flush 5-10 mL  5-10 mL IntraVENous PRN   ??? heparin (porcine) injection 5,000 Units  5,000 Units SubCUTAneous Q8H   ??? 0.9% sodium chloride infusion  75 mL/hr IntraVENous CONTINUOUS   ??? B complex-vitaminC-folic acid (NEPHROCAP) cap  1 Cap Oral DAILY   ??? diphenhydrAMINE (BENADRYL) injection 12.5 mg  12.5 mg IntraVENous Q6H PRN   ??? nicotine (NICODERM CQ) 14 mg/24 hr patch 1 Patch  1 Patch  TransDERmal Q24H   ??? glucose chewable tablet 16 g  4 Tab Oral PRN   ??? dextrose (D50W) injection Syrg 12.5-25 g  12.5-25 g IntraVENous PRN   ??? glucagon (GLUCAGEN) injection 1 mg  1 mg IntraMUSCular PRN       Care Plan discussed with: Patient/Family and Nurse    Total time spent with patient: 30 minutes.    Arnetha Massy, MD

## 2012-06-04 NOTE — Progress Notes (Addendum)
1157 - pt glucose poc 57; pt is not symptomatic;cranberry juice with peanut butter/saltines given; will recheck.  1221 - glucose poc rechecked at 83; will cont to monitor.

## 2012-06-04 NOTE — Progress Notes (Signed)
Bedside and Verbal shift change report given to amy rn (oncoming nurse) by don rn (offgoing nurse).  Report given with SBAR, Kardex, MAR and Recent Results.

## 2012-06-04 NOTE — Progress Notes (Signed)
Bedside and Verbal shift change report given to Angie (Cabin crew) by Hermenia Bers (offgoing nurse).  Report given with SBAR, Kardex, Intake/Output and MAR.

## 2012-06-05 LAB — GLUCOSE, POC
Glucose (POC): 118 mg/dL — ABNORMAL HIGH (ref 75–110)
Glucose (POC): 109 mg/dL (ref 75–110)
Glucose (POC): 105 mg/dL (ref 75–110)
Glucose (POC): 92 mg/dL (ref 75–110)
Glucose (POC): 132 mg/dL — ABNORMAL HIGH (ref 75–110)
Glucose (POC): 71 mg/dL — ABNORMAL LOW (ref 75–110)
Glucose (POC): 82 mg/dL (ref 75–110)

## 2012-06-05 MED ADMIN — sodium chloride (NS) flush 5-10 mL: INTRAVENOUS | @ 18:00:00 | NDC 82903065462

## 2012-06-05 MED ADMIN — sodium chloride (NS) flush 5-10 mL: INTRAVENOUS | @ 01:00:00 | NDC 87701099893

## 2012-06-05 MED ADMIN — HYDROmorphone (DILAUDID) injection 3 mg: INTRAVENOUS | @ 05:00:00 | NDC 00641012121

## 2012-06-05 MED ADMIN — sodium chloride (NS) flush 5-10 mL: INTRAVENOUS | @ 19:00:00 | NDC 87701099893

## 2012-06-05 MED ADMIN — HYDROmorphone (DILAUDID) injection 3 mg: INTRAVENOUS | NDC 00641012121

## 2012-06-05 MED ADMIN — HYDROmorphone (DILAUDID) injection 3 mg: INTRAVENOUS | @ 18:00:00 | NDC 00641012121

## 2012-06-05 MED ADMIN — lisinopril (PRINIVIL, ZESTRIL) tablet 40 mg: ORAL | @ 14:00:00 | NDC 68084006211

## 2012-06-05 MED ADMIN — HYDROmorphone (DILAUDID) injection 3 mg: INTRAVENOUS | @ 14:00:00 | NDC 00641012121

## 2012-06-05 MED ADMIN — hydrALAZINE (APRESOLINE) tablet 10 mg: ORAL | @ 03:00:00 | NDC 68084044711

## 2012-06-05 MED ADMIN — sodium chloride (NS) flush 5-10 mL: INTRAVENOUS | @ 18:00:00 | NDC 87701099893

## 2012-06-05 MED ADMIN — sodium chloride (NS) flush 5-10 mL: INTRAVENOUS | @ 09:00:00 | NDC 87701099893

## 2012-06-05 MED ADMIN — calcium acetate (PHOSLO) tablet 1,334 mg: ORAL | @ 22:00:00 | NDC 00574011302

## 2012-06-05 MED ADMIN — HYDROmorphone (DILAUDID) injection 3 mg: INTRAVENOUS | @ 20:00:00 | NDC 00641012121

## 2012-06-05 MED ADMIN — HYDROmorphone (DILAUDID) injection 3 mg: INTRAVENOUS | @ 09:00:00 | NDC 00641012121

## 2012-06-05 MED ADMIN — amLODIPine (NORVASC) tablet 10 mg: ORAL | @ 14:00:00 | NDC 59762153006

## 2012-06-05 MED ADMIN — HYDROmorphone (DILAUDID) injection 3 mg: INTRAVENOUS | @ 01:00:00 | NDC 00641012121

## 2012-06-05 MED ADMIN — calcium acetate (PHOSLO) tablet 1,334 mg: ORAL | @ 14:00:00 | NDC 00574011302

## 2012-06-05 MED ADMIN — sodium chloride (NS) flush 5-10 mL: INTRAVENOUS | @ 02:00:00 | NDC 87701099893

## 2012-06-05 MED ADMIN — HYDROmorphone (DILAUDID) injection 3 mg: INTRAVENOUS | @ 12:00:00 | NDC 00641012121

## 2012-06-05 MED ADMIN — hydrALAZINE (APRESOLINE) tablet 10 mg: ORAL | NDC 68084044711

## 2012-06-05 MED ADMIN — diphenhydrAMINE (BENADRYL) injection 12.5 mg: INTRAVENOUS | @ 09:00:00 | NDC 00641037621

## 2012-06-05 MED ADMIN — HYDROmorphone (DILAUDID) injection 3 mg: INTRAVENOUS | @ 22:00:00 | NDC 00641012121

## 2012-06-05 MED ADMIN — diphenhydrAMINE (BENADRYL) injection 12.5 mg: INTRAVENOUS | @ 16:00:00 | NDC 63323066401

## 2012-06-05 MED ADMIN — heparin (porcine) injection 5,000 Units: SUBCUTANEOUS | @ 09:00:00 | NDC 25021040201

## 2012-06-05 MED ADMIN — HYDROmorphone (DILAUDID) injection 3 mg: INTRAVENOUS | @ 16:00:00 | NDC 00641012121

## 2012-06-05 MED ADMIN — heparin (porcine) injection 5,000 Units: SUBCUTANEOUS | @ 18:00:00 | NDC 25021040201

## 2012-06-05 MED ADMIN — sodium chloride (NS) flush 5-10 mL: INTRAVENOUS | @ 03:00:00 | NDC 87701099893

## 2012-06-05 MED ADMIN — diphenhydrAMINE (BENADRYL) injection 12.5 mg: INTRAVENOUS | @ 03:00:00 | NDC 00641037621

## 2012-06-05 MED ADMIN — nicotine (NICODERM CQ) 14 mg/24 hr patch 1 Patch: TRANSDERMAL | @ 01:00:00

## 2012-06-05 MED ADMIN — HYDROmorphone (DILAUDID) injection 3 mg: INTRAVENOUS | @ 07:00:00 | NDC 00641012121

## 2012-06-05 MED ADMIN — sodium chloride (NS) flush 5-10 mL: INTRAVENOUS | @ 05:00:00 | NDC 87701099893

## 2012-06-05 MED ADMIN — calcium acetate (PHOSLO) tablet 1,334 mg: ORAL | @ 18:00:00 | NDC 00574011302

## 2012-06-05 MED ADMIN — HYDROmorphone (DILAUDID) injection 3 mg: INTRAVENOUS | @ 03:00:00 | NDC 00641012121

## 2012-06-05 MED ADMIN — metoprolol (LOPRESSOR) tablet 100 mg: ORAL | @ 14:00:00 | NDC 62584026611

## 2012-06-05 MED ADMIN — diphenhydrAMINE (BENADRYL) injection 12.5 mg: INTRAVENOUS | @ 22:00:00 | NDC 00641037621

## 2012-06-05 MED ADMIN — heparin (porcine) injection 5,000 Units: SUBCUTANEOUS | @ 02:00:00 | NDC 25021040201

## 2012-06-05 MED FILL — CALCIUM ACETATE 667 MG TAB: 667 mg | ORAL | Qty: 2

## 2012-06-05 MED FILL — METOPROLOL TARTRATE 50 MG TAB: 50 mg | ORAL | Qty: 2

## 2012-06-05 MED FILL — HEPARIN (PORCINE) 5,000 UNIT/ML IJ SOLN: 5000 unit/mL | INTRAMUSCULAR | Qty: 1

## 2012-06-05 MED FILL — BD POSIFLUSH NORMAL SALINE 0.9 % INJECTION SYRINGE: INTRAMUSCULAR | Qty: 10

## 2012-06-05 MED FILL — HYDROMORPHONE 2 MG/ML INJECTION SOLUTION: 2 mg/mL | INTRAMUSCULAR | Qty: 2

## 2012-06-05 MED FILL — DIPHENHYDRAMINE HCL 50 MG/ML IJ SOLN: 50 mg/mL | INTRAMUSCULAR | Qty: 1

## 2012-06-05 MED FILL — HYDRALAZINE 10 MG TAB: 10 mg | ORAL | Qty: 1

## 2012-06-05 MED FILL — AMLODIPINE 5 MG TAB: 5 mg | ORAL | Qty: 2

## 2012-06-05 MED FILL — LISINOPRIL 20 MG TAB: 20 mg | ORAL | Qty: 2

## 2012-06-05 MED FILL — NEPHROCAPS 1 MG CAPSULE: 1 mg | ORAL | Qty: 1

## 2012-06-05 MED FILL — BD POSIFLUSH NORMAL SALINE 0.9 % INJECTION SYRINGE: INTRAMUSCULAR | Qty: 20

## 2012-06-05 MED FILL — NICOTINE 14 MG/24 HR DAILY PATCH: 14 mg/24 hr | TRANSDERMAL | Qty: 1

## 2012-06-05 MED FILL — INSULIN LISPRO 100 UNIT/ML INJECTION: 100 unit/mL | SUBCUTANEOUS | Qty: 1

## 2012-06-05 MED FILL — INSULIN GLARGINE 100 UNIT/ML INJECTION: 100 unit/mL | SUBCUTANEOUS | Qty: 0.05

## 2012-06-05 NOTE — Progress Notes (Signed)
Bedside shift change report given to [place]), Charity fundraiser  (Cabin crew) by Denzil Magnuson, RN  (offgoing nurse).  Report given with SBAR, Kardex and MAR.

## 2012-06-05 NOTE — Progress Notes (Signed)
Nephrology Progress Note    Date of Admission : 05/24/2012    CC:  Follow up for ESRD       Assessment and Plan   ESRD : HD tomorrow. Should be heparinized for Rx   Anemia : SC D + ESRD.     HTN :uncontrolled. Increased Lisinopril and Metoprolol doses  SCC: per Hematology    LLL PNA : on Abx.      For other plans, see orders.    Interval History:  HD yesterday. Clotted at the end and lost blood. No heparin used  No new Sx  Does nt appear to be in any pain     Current Facility-Administered Medications   Medication Dose Route Frequency   ??? sodium chloride (NS) 0.9 % flush       ??? metoprolol (LOPRESSOR) tablet 50 mg  50 mg Oral BID   ??? HYDROmorphone (DILAUDID) injection 3 mg  3 mg IntraVENous Q2H PRN   ??? insulin glargine (LANTUS) injection 5 Units  5 Units SubCUTAneous DAILY   ??? lisinopril (PRINIVIL, ZESTRIL) tablet 20 mg  20 mg Oral DAILY   ??? 0.9% sodium chloride infusion  25 mL/hr IntraVENous CONTINUOUS   ??? acetaminophen (TYLENOL) tablet 650 mg  650 mg Oral Q4H PRN   ??? amLODIPine (NORVASC) tablet 10 mg  10 mg Oral DAILY   ??? metoclopramide HCl (REGLAN) injection 10 mg  10 mg IntraVENous Q6H PRN   ??? hydrALAZINE (APRESOLINE) tablet 10 mg  10 mg Oral Q6H PRN   ??? insulin lispro (HUMALOG) injection   SubCUTAneous TIDAC   ??? calcium acetate (PHOSLO) tablet 1,334 mg  2 Tab Oral TID WITH MEALS   ??? sodium chloride (NS) flush 5-10 mL  5-10 mL IntraVENous Q8H   ??? sodium chloride (NS) flush 5-10 mL  5-10 mL IntraVENous PRN   ??? heparin (porcine) injection 5,000 Units  5,000 Units SubCUTAneous Q8H   ??? 0.9% sodium chloride infusion  75 mL/hr IntraVENous CONTINUOUS   ??? B complex-vitaminC-folic acid (NEPHROCAP) cap  1 Cap Oral DAILY   ??? diphenhydrAMINE (BENADRYL) injection 12.5 mg  12.5 mg IntraVENous Q6H PRN   ??? nicotine (NICODERM CQ) 14 mg/24 hr patch 1 Patch  1 Patch TransDERmal Q24H   ??? glucose chewable tablet 16 g  4 Tab Oral PRN   ??? dextrose (D50W) injection Syrg 12.5-25 g  12.5-25 g IntraVENous PRN   ??? glucagon (GLUCAGEN)  injection 1 mg  1 mg IntraMUSCular PRN        Review of Systems:  Pertinent items are noted in HPI.    Objective:  Vitals:    Filed Vitals:    06/04/12 2033 06/04/12 2308 06/05/12 0310 06/05/12 0838   BP: 160/89 184/97 160/92    Pulse: 83 81 75    Temp: 98.6 ??F (37 ??C) 98.7 ??F (37.1 ??C)     Resp: 18 16 16     Height:       Weight:       SpO2: 98% 95% 96% 96%     Intake and Output:     06/16 1900 - 06/18 0659  In: 1140 [P.O.:1140]  Out: 2069     Physical Examination:  General: Mildly distressed from pain   Neck:  Supple, no mass  Resp:  Lungs CTA B/L, no wheezing , normal respiratory effort  CV:  RRR,  no murmur or rub,trace  LE edema  GI:  Soft, NT, + Bowel sounds, no hepatosplenomegaly  Neurologic:  Non  focal  Psych:             AAO x 3 appropriate affect   Skin:  No Rash. Multiple acneform lesions on face       []     High complexity decision making was performed  []     Patient is at high-risk of decompensation with multiple organ involvement    Lab Data Personally Reviewed: I have reviewed all the pertinent labs, microbiology data and radiology studies during assessment.    No results found for this basename: NA:3,K:3,CL:3,CO2:3,GLU:3,BUN:3,CREA:3,CA:3,MG:3,PHOS:3,ALB:3,TBIL:3,SGOT:3,ALT:3,INR:3 in the last 72 hours  Recent Labs   Basename 06/04/12 1030 06/03/12 1340    WBC 3.7* 2.7*    HGB 8.4* 8.9*    HCT 25.9* 27.8*    PLT 141* 136*     Lab Results   Component Value Date/Time    Specimen Description: BLOOD 05/24/2012 10:47 AM     Lab Results   Component Value Date/Time    Culture result: NO GROWTH 5 DAYS 05/24/2012 10:47 AM     Recent Results (from the past 24 hour(s))   CBC W/O DIFF    Collection Time    06/04/12 10:30 AM       Component Value Range    WBC 3.7 (*) 4.1 - 11.1 K/uL    RBC 2.75 (*) 4.10 - 5.70 M/uL    HGB 8.4 (*) 12.1 - 17.0 g/dL    HCT 96.2 (*) 95.2 - 50.3 %    MCV 94.2  80.0 - 99.0 FL    MCH 30.5  26.0 - 34.0 PG    MCHC 32.4  30.0 - 36.5 g/dL    RDW 84.1 (*) 32.4 - 14.5 %    PLATELET 141 (*) 150 -  400 K/uL   GLUCOSE, POC    Collection Time    06/04/12 11:57 AM       Component Value Range    POC GLUCOSE 57 (*) 75 - 110 mg/dL    Performed by Johny Shock     GLUCOSE, POC    Collection Time    06/04/12 12:19 PM       Component Value Range    POC GLUCOSE 204 (*) 75 - 110 mg/dL    Performed by Johny Shock     GLUCOSE, POC    Collection Time    06/04/12 12:21 PM       Component Value Range    POC GLUCOSE 86  75 - 110 mg/dL    Performed by Johny Shock     GLUCOSE, POC    Collection Time    06/04/12  1:11 PM       Component Value Range    POC GLUCOSE 77  75 - 110 mg/dL    Performed by Johny Shock     TYPE & CROSSMATCH    Collection Time    06/04/12  1:45 PM       Component Value Range    Crossmatch Expiration 06/07/2012      ABO/Rh(D) B POS      Antibody screen NEG      Physician instructions [facility] NEGATIVE      Unit number [id number]      Blood component type RC LR AS5      Unit division 00      Status of unit ALLOCATED      Crossmatch result COMPATIBLE      ANTIGEN/ANTIBODY INF E NEGATIVE, KELL NEGATIVE,      Unit number [id number]  Blood component type RC LR AS5      Unit division 00      Status of unit ALLOCATED      Crossmatch result COMPATIBLE      ANTIGEN/ANTIBODY INF E NEGATIVE, KELL NEGATIVE,     GLUCOSE, POC    Collection Time    06/04/12  1:47 PM       Component Value Range    POC GLUCOSE 70 (*) 75 - 110 mg/dL    Performed by Johny Shock     GLUCOSE, POC    Collection Time    06/04/12  2:10 PM       Component Value Range    POC GLUCOSE 87  75 - 110 mg/dL    Performed by Johny Shock     GLUCOSE, POC    Collection Time    06/04/12  4:21 PM       Component Value Range    POC GLUCOSE 131 (*) 75 - 110 mg/dL    Performed by Huey Romans     GLUCOSE, POC    Collection Time    06/04/12  5:53 PM       Component Value Range    POC GLUCOSE 125 (*) 75 - 110 mg/dL    Performed by Bolivar Haw     GLUCOSE, POC    Collection Time    06/04/12  9:42 PM       Component Value Range     POC GLUCOSE 118 (*) 75 - 110 mg/dL    Performed by Janit Pagan     GLUCOSE, POC    Collection Time    06/05/12  3:26 AM       Component Value Range    POC GLUCOSE 109  75 - 110 mg/dL    Performed by Janit Pagan     GLUCOSE, POC    Collection Time    06/05/12  7:30 AM       Component Value Range    POC GLUCOSE 105  75 - 110 mg/dL    Performed by Janit Pagan         Total time spent with patient:  15  min.                               Care Plan discussed with:  Patient  x   Family      RN  x    Consulting Physician /Specialist        I have reviewed the flowsheets.  Chart reviewed. Pertinent Notes reviewed.   Current Medications list reviewed by me.  No change in PMH ,family and social history from Consult note.    Rhae Hammock, MD  06/05/2012    [facility]  [address], [address],   [place], [place] [address]  Phone - [phone]   Fax: [phone]  [email or web address]

## 2012-06-05 NOTE — Progress Notes (Addendum)
Hospitalist Progress Note           Glori Luis MD.  Daily Progress Note: 06/05/2012    Assessment/Plan:     1. LLB pneumonia, poa- On zosyn and Levaquin. Zosyn stopped per ID, Follow Cx , no growth thus far,completed levaquin course  2. Sickle cell crisis, poa - encourge po hydration, 02 as needed . Continue Hydrea.To follow closely and adjust as appropriate, added zofran for nausea, pt off pca ,not helping , started on iv prn , increased to q 2 hrs   3. ESRD on HD - HD as per nephrology   4. Anemia - Hb 7.5-7.9 , transfuse 1 unit 6/12 for hb on 7.2 with HD But clotted, hematology f/up , cont to monitor, 1 unit on 6/13,and repeat .awiting transfusion today 06/04/12.  5. Hyponatremia - monitor, stable   HTN- BP consistently higher, , pain contributing ? ,increased norvasc to 10 , still not better controlled  Agree with nephrology increase lisinopril., increased metoprolol to 25 mg during this crisis , pain control and reassess   6. DM- lantus, ssi , had hypoglycemia 6/12. Resolved.  7. Tobaco abuse - nicotine, counseled   8. Chronic pain - pain management as per hematology   9. Thrombocytopenia: watch closely.  10. DVT prophylaxis- heparin  11. Plan- Home on po abx soon once pain controlled   12. Case d/w patient , addressed pain & other issues to full satisfaction.              Subjective:   The patient stable pain slowly improving with regimen.going for dialysis in am.     Review of Systems:   A comprehensive review of systems was negative.    Objective:   Physical Exam:     BP 153/94   Pulse 72   Temp 98.1 ??F (36.7 ??C)   Resp 16   Ht 5\' 5"  (1.651 m)   Wt 203 lb 4.2 oz   BMI 33.82 kg/m2   SpO2 98% O2 Flow Rate (L/min): 2 l/min O2 Device: Room air    Temp (24hrs), Avg:98.6 ??F (37 ??C), Min:98.1 ??F (36.7 ??C), Max:98.9 ??F (37.2 ??C)        06/16 1900 - 06/18 0659  In: 1140 [P.O.:1140]  Out: 2069     General:  Alert, cooperative, no distress, appears stated age.   Lungs:   Clear to  auscultation bilaterally.   Chest wall:  No tenderness or deformity.   Heart:  Regular rate and rhythm, S1, S2 normal, no murmur, click, rub or gallop.   Abdomen:   Soft, non-tender. Bowel sounds normal. No masses,  No organomegaly.   Extremities: Extremities normal, atraumatic, no cyanosis or edema.   Pulses: 2+ and symmetric all extremities.   Skin: Skin color, texture, turgor normal. No rashes or lesions   Neurologic: CNII-XII intact.      Data Review:       Recent Days:  Recent Labs   Basename 06/04/12 1030 06/03/12 1340    WBC 3.7* 2.7*    HGB 8.4* 8.9*    HCT 25.9* 27.8*    PLT 141* 136*     No results found for this basename: NA:3,K:3,CL:3,CO2:3,GLU:3,BUN:3,CREA:3,CA:3,MG:3,PHOS:3,ALB:3,TBIL:3,SGOT:3,ALT:3,INR:3 in the last 72 hours  No results found for this basename: PH:3,PCO2:3,PO2:3,HCO3:3,FIO2:3 in the last 72 hours    24 Hour Results:  Recent Results (from the past 24 hour(s))   GLUCOSE, POC    Collection Time    06/04/12  4:21 PM  Component Value Range    POC GLUCOSE 131 (*) 75 - 110 mg/dL    Performed by Huey Romans     GLUCOSE, POC    Collection Time    06/04/12  5:53 PM       Component Value Range    POC GLUCOSE 125 (*) 75 - 110 mg/dL    Performed by Bolivar Haw     GLUCOSE, POC    Collection Time    06/04/12  9:42 PM       Component Value Range    POC GLUCOSE 118 (*) 75 - 110 mg/dL    Performed by Janit Pagan     GLUCOSE, POC    Collection Time    06-26-12  3:26 AM       Component Value Range    POC GLUCOSE 109  75 - 110 mg/dL    Performed by Janit Pagan     GLUCOSE, POC    Collection Time    06-26-2012  7:30 AM       Component Value Range    POC GLUCOSE 105  75 - 110 mg/dL    Performed by Janit Pagan     GLUCOSE, POC    Collection Time    2012-06-26  9:52 AM       Component Value Range    POC GLUCOSE 92  75 - 110 mg/dL    Performed by Loyal Gambler     GLUCOSE, POC    Collection Time    06/26/12 12:18 PM       Component Value Range    POC GLUCOSE 132 (*) 75 - 110 mg/dL    Performed by  Huey Romans         Problem List:  Problem List as of 26-Jun-2012  Never Reviewed      Codes Class Noted - Resolved    *Sickle cell crisis 282.62  05/24/2012 - Present              Medications reviewed  Current Facility-Administered Medications   Medication Dose Route Frequency   ??? lisinopril (PRINIVIL, ZESTRIL) tablet 40 mg  40 mg Oral DAILY   ??? metoprolol (LOPRESSOR) tablet 100 mg  100 mg Oral BID   ??? sodium chloride (NS) 0.9 % flush       ??? HYDROmorphone (DILAUDID) injection 3 mg  3 mg IntraVENous Q2H PRN   ??? insulin glargine (LANTUS) injection 5 Units  5 Units SubCUTAneous DAILY   ??? 0.9% sodium chloride infusion  25 mL/hr IntraVENous CONTINUOUS   ??? acetaminophen (TYLENOL) tablet 650 mg  650 mg Oral Q4H PRN   ??? amLODIPine (NORVASC) tablet 10 mg  10 mg Oral DAILY   ??? metoclopramide HCl (REGLAN) injection 10 mg  10 mg IntraVENous Q6H PRN   ??? hydrALAZINE (APRESOLINE) tablet 10 mg  10 mg Oral Q6H PRN   ??? insulin lispro (HUMALOG) injection   SubCUTAneous TIDAC   ??? calcium acetate (PHOSLO) tablet 1,334 mg  2 Tab Oral TID WITH MEALS   ??? sodium chloride (NS) flush 5-10 mL  5-10 mL IntraVENous Q8H   ??? sodium chloride (NS) flush 5-10 mL  5-10 mL IntraVENous PRN   ??? heparin (porcine) injection 5,000 Units  5,000 Units SubCUTAneous Q8H   ??? 0.9% sodium chloride infusion  75 mL/hr IntraVENous CONTINUOUS   ??? B complex-vitaminC-folic acid (NEPHROCAP) cap  1 Cap Oral DAILY   ??? diphenhydrAMINE (BENADRYL) injection 12.5 mg  12.5 mg IntraVENous Q6H PRN   ???  nicotine (NICODERM CQ) 14 mg/24 hr patch 1 Patch  1 Patch TransDERmal Q24H   ??? glucose chewable tablet 16 g  4 Tab Oral PRN   ??? dextrose (D50W) injection Syrg 12.5-25 g  12.5-25 g IntraVENous PRN   ??? glucagon (GLUCAGEN) injection 1 mg  1 mg IntraMUSCular PRN       Care Plan discussed with: Patient/Family and Nurse    Total time spent with patient: 30 minutes.    Arnetha Massy, MD

## 2012-06-05 NOTE — Progress Notes (Addendum)
Unable to draw labs on patient at this time.  ICU nurse looked and did not believe she would be able to draw his labs either.  Dr. Carleene Mains office called and notified that we were unsuccessful; awaiting call back.  Dr. Carleene Mains office called back regarding lab work, states that he would like for this to be drawn today and we should called anesthesiology for assistance if needed.  Calling anesthesiology at this time to see if someone is able to help draw lab work.   Spoke with Dr. Carole Binning regarding lab draw and he states that anesthesiology does not do this.

## 2012-06-05 NOTE — Progress Notes (Signed)
Hematology-Oncology Progress Note    Tyler Ballard  06-Jan-1981  [id number]  06/05/2012    Follow-up for: sickle cell anemia     [x]         Chart notes since last visit reviewed   [x]         Medications reviewed for allergies and interactions       Case discussed with the following:         []         Case Manager                    []         Nursing Staff                                                                         []         Pathologist                                                                        []         FAMILY      Subjective:     Spoke with patient who complains of: pt. Seems comfortable at present, informs me that he lost a unit of blood when dialysis clotted yesterday.    Objective:     Patient Vitals for the past 24 hrs:   BP Temp Pulse Resp SpO2   06/05/12 0959 190/102 mmHg 98.9 ??F (37.2 ??C) 78  16  98 %   06/05/12 0838 - - - - 96 %   06/05/12 0310 160/92 mmHg - 75  16  96 %   06/04/12 2308 184/97 mmHg 98.7 ??F (37.1 ??C) 81  16  95 %   06/04/12 2033 160/89 mmHg 98.6 ??F (37 ??C) 83  18  98 %   06/04/12 1542 162/88 mmHg 98.5 ??F (36.9 ??C) 82  16  100 %   06/04/12 1452 195/108 mmHg 98.9 ??F (37.2 ??C) 94  16  96 %   06/04/12 1320 194/94 mmHg - 84  - -   06/04/12 1250 161/84 mmHg - 76  - -   06/04/12 1220 182/95 mmHg - 76  - -   06/04/12 1150 141/78 mmHg - 69  - -   06/04/12 1120 158/91 mmHg - 71  - -       REVIEW OF SYSTEMS:    Constitutional: negative fever, negative chills, negative weight loss  Eyes:   negative visual changes  ENT:   negative sore throat, tongue or lip swelling  Respiratory:  negative cough, negative dyspnea  Cards:  negative for chest pain, palpitations, lower extremity edema  GI:   negative for nausea, vomiting, diarrhea, and abdominal pain  Neuro:  negative for headaches, dizziness, vertigo  [x]                         Full ROS o/w normal/non contributor    Constitutional:  Patient looks  []         Sick  []         Frail  [x]         Better                                                  []         Depressed    HEENT:  [x]    NC                         []    AT               []     ALOPECIA           Eyes: [x]    Normal               []     Icteric  Oropharynx: [x]     Normal                  []   Thrush               []    Dry  Mucositis: [x]     None                 Grade: []         I  []         II  []         III  []         IV  Neck:   [x]    Supple                  []   Rigid               JVD:    [x]    ABSENT       []    PRESENT  Lymphadenopathy:   []    None Noted            []    PRESENT    Chest:  [x]    Clear               []     Rhonchi                      Dec'd @     []   Right Base           []    Left Base    CV:             [x]    Regular              []   Irregular               []    Tachy                []    Murmur  Abdominal:   [x]     Soft              []    NON-tender               []    Tender      BS:    [x]    ABSENT                   []    PRESENT  Liver:     [  x]  NON-palp                  []    EDGE- palp  Spleen: [x]    NON-palp                   []   EDGE - palp  Mass:   [x]    ABSENT                          []   PRESENT  Extr:    []   Lymphedema             []    Cyanosis      []   Clubbing  Edema:     []    NONE       []    PRESENT  Skin:  Intact [x]            Purpura []         Rash: [x]    ABSENT       []   PRESENT  Neuro:  [x]         Normal  []         Confused      Available labs reviewed:  Labs:    Recent Results (from the past 24 hour(s))   GLUCOSE, POC    Collection Time    06/04/12 11:57 AM       Component Value Range    POC GLUCOSE 57 (*) 75 - 110 mg/dL    Performed by Johny Shock     GLUCOSE, POC    Collection Time    06/04/12 12:19 PM       Component Value Range    POC GLUCOSE 204 (*) 75 - 110 mg/dL    Performed by Johny Shock     GLUCOSE, POC    Collection Time    06/04/12 12:21 PM       Component Value Range    POC GLUCOSE 86  75 - 110 mg/dL    Performed by Johny Shock     GLUCOSE, POC    Collection Time    06/04/12  1:11 PM       Component Value Range    POC GLUCOSE 77  75  - 110 mg/dL    Performed by Johny Shock     TYPE & CROSSMATCH    Collection Time    06/04/12  1:45 PM       Component Value Range    Crossmatch Expiration 06/07/2012      ABO/Rh(D) B POS      Antibody screen NEG      Physician instructions [facility] NEGATIVE      Unit number [id number]      Blood component type RC LR AS5      Unit division 00      Status of unit ALLOCATED      Crossmatch result COMPATIBLE      ANTIGEN/ANTIBODY INF E NEGATIVE, KELL NEGATIVE,      Unit number [id number]      Blood component type RC LR AS5      Unit division 00      Status of unit ALLOCATED      Crossmatch result COMPATIBLE      ANTIGEN/ANTIBODY INF E NEGATIVE, KELL NEGATIVE,     GLUCOSE, POC    Collection Time    06/04/12  1:47 PM       Component Value Range    POC GLUCOSE 70 (*)  75 - 110 mg/dL    Performed by Johny Shock     GLUCOSE, POC    Collection Time    06/04/12  2:10 PM       Component Value Range    POC GLUCOSE 87  75 - 110 mg/dL    Performed by Johny Shock     GLUCOSE, POC    Collection Time    06/04/12  4:21 PM       Component Value Range    POC GLUCOSE 131 (*) 75 - 110 mg/dL    Performed by Huey Romans     GLUCOSE, POC    Collection Time    06/04/12  5:53 PM       Component Value Range    POC GLUCOSE 125 (*) 75 - 110 mg/dL    Performed by Bolivar Haw     GLUCOSE, POC    Collection Time    06/04/12  9:42 PM       Component Value Range    POC GLUCOSE 118 (*) 75 - 110 mg/dL    Performed by Janit Pagan     GLUCOSE, POC    Collection Time    06/05/12  3:26 AM       Component Value Range    POC GLUCOSE 109  75 - 110 mg/dL    Performed by Janit Pagan     GLUCOSE, POC    Collection Time    06/05/12  7:30 AM       Component Value Range    POC GLUCOSE 105  75 - 110 mg/dL    Performed by Janit Pagan     GLUCOSE, POC    Collection Time    06/05/12  9:52 AM       Component Value Range    POC GLUCOSE 92  75 - 110 mg/dL    Performed by Loyal Gambler         Available Xrays reviewed:    Chemotherapy monitored and  toxicities assessed:    Assessment and Plan   1. Sickle cell pain crisis... Probably precipitated by pneumonia...Marland Kitchenhe is still requiring IV pain meds, he looks much more  Comfortable ,. Pt. Says his crises usually last 3-4 days, is followed at [facility] in the fellow's clinic  2. Anemia..hgb 8.4  Yesterday, probably lower today given the problems on dialysis yesterday  Secondary to #1 +/_ esrd...  3. Pneumonia... Cultures negative so far, will continue antibiotics,off hydrea , cxr looks better on my evaluation  4. ESRD... For dialysis today  Harlen Labs, MD

## 2012-06-05 NOTE — Progress Notes (Signed)
Pressure Ulcer Documentation  (COMPLETE ONE LABEL PER PRESSURE ULCER)  For further information, please review corresponding Wound Care flowsheet.      Tyler Ballard has:    No pressure ulcer noted and pressure ulcer prevention initiated.      MIMI Alessandra Grout, RN

## 2012-06-05 NOTE — Progress Notes (Signed)
Pt BS 71. Pt was given 4 oz cranberry juice. BS recheck was 82.

## 2012-06-05 NOTE — Progress Notes (Signed)
Patient refused his 0900 Lantus.  He requested his blood sugar be rechecked prior to receiving insulin, which was 92.  At this time he stated "I don't think I need it"

## 2012-06-06 LAB — GLUCOSE, POC
Glucose (POC): 124 mg/dL — ABNORMAL HIGH (ref 75–110)
Glucose (POC): 98 mg/dL (ref 75–110)
Glucose (POC): 102 mg/dL (ref 75–110)
Glucose (POC): 71 mg/dL — ABNORMAL LOW (ref 75–110)
Glucose (POC): 90 mg/dL (ref 75–110)

## 2012-06-06 LAB — CBC WITH AUTOMATED DIFF
ABS. BASOPHILS: 0 10*3/uL (ref 0.0–0.1)
ABS. EOSINOPHILS: 0.1 10*3/uL (ref 0.0–0.4)
ABS. LYMPHOCYTES: 0.8 10*3/uL (ref 0.8–3.5)
ABS. MONOCYTES: 0.4 10*3/uL (ref 0.0–1.0)
ABS. NEUTROPHILS: 1.4 10*3/uL — ABNORMAL LOW (ref 1.8–8.0)
BASOPHILS: 0 % (ref 0–1)
EOSINOPHILS: 5 % (ref 0–7)
HCT: 24.9 % — ABNORMAL LOW (ref 36.6–50.3)
HGB: 7.9 g/dL — ABNORMAL LOW (ref 12.1–17.0)
LYMPHOCYTES: 28 % (ref 12–49)
MCH: 29.9 PG (ref 26.0–34.0)
MCHC: 31.7 g/dL (ref 30.0–36.5)
MCV: 94.3 FL (ref 80.0–99.0)
MONOCYTES: 13 % (ref 5–13)
NEUTROPHILS: 54 % (ref 32–75)
PLATELET: 103 10*3/uL — ABNORMAL LOW (ref 150–400)
RBC: 2.64 M/uL — ABNORMAL LOW (ref 4.10–5.70)
RDW: 14.7 % — ABNORMAL HIGH (ref 11.5–14.5)
WBC: 2.7 10*3/uL — ABNORMAL LOW (ref 4.1–11.1)

## 2012-06-06 MED ADMIN — lisinopril (PRINIVIL, ZESTRIL) tablet 40 mg: ORAL | @ 12:00:00 | NDC 68084006211

## 2012-06-06 MED ADMIN — metoprolol (LOPRESSOR) tablet 100 mg: ORAL | @ 12:00:00 | NDC 62584026611

## 2012-06-06 MED ADMIN — HYDROmorphone (DILAUDID) injection 3 mg: INTRAVENOUS | @ 20:00:00 | NDC 00641012121

## 2012-06-06 MED ADMIN — sodium chloride (NS) flush 5-10 mL: INTRAVENOUS | @ 17:00:00 | NDC 82903065462

## 2012-06-06 MED ADMIN — hydrALAZINE (APRESOLINE) tablet 50 mg: ORAL | @ 16:00:00 | NDC 62584073411

## 2012-06-06 MED ADMIN — sodium chloride (NS) flush 5-10 mL: INTRAVENOUS | @ 02:00:00 | NDC 87701099893

## 2012-06-06 MED ADMIN — diphenhydrAMINE (BENADRYL) injection 12.5 mg: INTRAVENOUS | @ 03:00:00 | NDC 00641037621

## 2012-06-06 MED ADMIN — calcium acetate (PHOSLO) tablet 1,334 mg: ORAL | @ 16:00:00 | NDC 00574011302

## 2012-06-06 MED ADMIN — sodium chloride (NS) 0.9 % flush: @ 22:00:00 | NDC 87701099893

## 2012-06-06 MED ADMIN — calcium acetate (PHOSLO) tablet 1,334 mg: ORAL | @ 12:00:00 | NDC 00574011302

## 2012-06-06 MED ADMIN — sodium chloride (NS) 0.9 % flush: @ 23:00:00 | NDC 82903065462

## 2012-06-06 MED ADMIN — sodium chloride (NS) 0.9 % flush: @ 20:00:00 | NDC 87701099893

## 2012-06-06 MED ADMIN — sodium chloride (NS) flush 5-10 mL: INTRAVENOUS | @ 12:00:00 | NDC 87701099893

## 2012-06-06 MED ADMIN — HYDROmorphone (DILAUDID) injection 3 mg: INTRAVENOUS | @ 12:00:00 | NDC 00641012121

## 2012-06-06 MED ADMIN — HYDROmorphone (DILAUDID) injection 3 mg: INTRAVENOUS | @ 18:00:00 | NDC 00641012121

## 2012-06-06 MED ADMIN — heparin (porcine) injection 5,000 Units: SUBCUTANEOUS | @ 16:00:00 | NDC 25021040201

## 2012-06-06 MED ADMIN — HYDROmorphone (DILAUDID) injection 3 mg: INTRAVENOUS | @ 06:00:00 | NDC 00641012121

## 2012-06-06 MED ADMIN — heparin (porcine) 1,000 unit/mL injection 3,500 Units: INTRAVENOUS | @ 19:00:00 | NDC 63323054001

## 2012-06-06 MED ADMIN — amLODIPine (NORVASC) tablet 10 mg: ORAL | @ 12:00:00 | NDC 59762153006

## 2012-06-06 MED ADMIN — HYDROmorphone (DILAUDID) injection 3 mg: INTRAVENOUS | @ 10:00:00 | NDC 00641012121

## 2012-06-06 MED ADMIN — metoprolol (LOPRESSOR) tablet 100 mg: ORAL | NDC 62584026611

## 2012-06-06 MED ADMIN — diphenhydrAMINE (BENADRYL) injection 12.5 mg: INTRAVENOUS | @ 10:00:00 | NDC 00641037621

## 2012-06-06 MED ADMIN — sodium chloride (NS) 0.9 % flush: @ 06:00:00 | NDC 82903065462

## 2012-06-06 MED ADMIN — diphenhydrAMINE (BENADRYL) injection 12.5 mg: INTRAVENOUS | @ 18:00:00 | NDC 63323066401

## 2012-06-06 MED ADMIN — heparin (porcine) injection 5,000 Units: SUBCUTANEOUS | @ 02:00:00 | NDC 25021040201

## 2012-06-06 MED ADMIN — nicotine (NICODERM CQ) 14 mg/24 hr patch 1 Patch: TRANSDERMAL | @ 01:00:00

## 2012-06-06 MED ADMIN — HYDROmorphone (DILAUDID) injection 3 mg: INTRAVENOUS | @ 03:00:00 | NDC 00641012121

## 2012-06-06 MED ADMIN — sodium chloride (NS) flush 5-10 mL: INTRAVENOUS | @ 10:00:00 | NDC 87701099893

## 2012-06-06 MED ADMIN — diphenhydrAMINE (BENADRYL) injection 12.5 mg: INTRAVENOUS | @ 14:00:00 | NDC 00641037621

## 2012-06-06 MED ADMIN — insulin glargine (LANTUS) injection 5 Units: SUBCUTANEOUS | @ 13:00:00 | NDC 09999222001

## 2012-06-06 MED ADMIN — HYDROmorphone (DILAUDID) injection 3 mg: INTRAVENOUS | @ 22:00:00 | NDC 00641012121

## 2012-06-06 MED ADMIN — HYDROmorphone (DILAUDID) injection 3 mg: INTRAVENOUS | @ 14:00:00 | NDC 00641012121

## 2012-06-06 MED ADMIN — HYDROmorphone (DILAUDID) injection 3 mg: INTRAVENOUS | @ 08:00:00 | NDC 00641012121

## 2012-06-06 MED ADMIN — sodium chloride (NS) 0.9 % flush: @ 08:00:00 | NDC 82903065462

## 2012-06-06 MED ADMIN — HYDROmorphone (DILAUDID) injection 3 mg: INTRAVENOUS | @ 16:00:00 | NDC 00641012121

## 2012-06-06 MED ADMIN — HYDROmorphone (DILAUDID) injection 3 mg: INTRAVENOUS | @ 02:00:00 | NDC 00641012121

## 2012-06-06 MED ADMIN — sodium chloride (NS) 0.9 % flush: @ 12:00:00 | NDC 82903065462

## 2012-06-06 MED ADMIN — heparin (porcine) injection 5,000 Units: SUBCUTANEOUS | @ 10:00:00 | NDC 25021040201

## 2012-06-06 MED ADMIN — cloNIDine (CATAPRES) tablet 0.1 mg: ORAL | @ 03:00:00 | NDC 62584065711

## 2012-06-06 MED ADMIN — sodium chloride (NS) 0.9 % flush: @ 10:00:00 | NDC 87701099893

## 2012-06-06 MED FILL — BD POSIFLUSH NORMAL SALINE 0.9 % INJECTION SYRINGE: INTRAMUSCULAR | Qty: 10

## 2012-06-06 MED FILL — INSULIN GLARGINE 100 UNIT/ML INJECTION: 100 unit/mL | SUBCUTANEOUS | Qty: 0.05

## 2012-06-06 MED FILL — METOPROLOL TARTRATE 50 MG TAB: 50 mg | ORAL | Qty: 2

## 2012-06-06 MED FILL — DIPHENHYDRAMINE HCL 50 MG/ML IJ SOLN: 50 mg/mL | INTRAMUSCULAR | Qty: 1

## 2012-06-06 MED FILL — CALCIUM ACETATE 667 MG TAB: 667 mg | ORAL | Qty: 2

## 2012-06-06 MED FILL — HYDROMORPHONE 2 MG/ML INJECTION SOLUTION: 2 mg/mL | INTRAMUSCULAR | Qty: 2

## 2012-06-06 MED FILL — NEPHROCAPS 1 MG CAPSULE: 1 mg | ORAL | Qty: 1

## 2012-06-06 MED FILL — BD POSIFLUSH NORMAL SALINE 0.9 % INJECTION SYRINGE: INTRAMUSCULAR | Qty: 20

## 2012-06-06 MED FILL — NICOTINE 14 MG/24 HR DAILY PATCH: 14 mg/24 hr | TRANSDERMAL | Qty: 1

## 2012-06-06 MED FILL — HEPARIN (PORCINE) 5,000 UNIT/ML IJ SOLN: 5000 unit/mL | INTRAMUSCULAR | Qty: 1

## 2012-06-06 MED FILL — AMLODIPINE 5 MG TAB: 5 mg | ORAL | Qty: 2

## 2012-06-06 MED FILL — CLONIDINE 0.1 MG TAB: 0.1 mg | ORAL | Qty: 1

## 2012-06-06 MED FILL — LISINOPRIL 20 MG TAB: 20 mg | ORAL | Qty: 2

## 2012-06-06 MED FILL — HYDRALAZINE 50 MG TAB: 50 mg | ORAL | Qty: 1

## 2012-06-06 MED FILL — BD POSIFLUSH NORMAL SALINE 0.9 % INJECTION SYRINGE: INTRAMUSCULAR | Qty: 40

## 2012-06-06 MED FILL — HEPARIN (PORCINE) 1,000 UNIT/ML IJ SOLN: 1000 unit/mL | INTRAMUSCULAR | Qty: 4

## 2012-06-06 NOTE — Progress Notes (Signed)
Nephrology Progress Note    Date of Admission : 05/24/2012    CC:  Follow up for ESRD       Assessment and Plan   ESRD : HD today and will be heparinized for Rx   Anemia : SC D + ESRD. Check CBC at HD      HTN :uncontrolled. Added hydralazine   SCC: per Hematology    LLL PNA : per ID    For other plans, see orders.    Interval History:  No new events  Pain free  Probably home today after HD     Current Facility-Administered Medications   Medication Dose Route Frequency   ??? sodium chloride (NS) 0.9 % flush       ??? sodium chloride (NS) 0.9 % flush       ??? sodium chloride (NS) 0.9 % flush       ??? sodium chloride (NS) 0.9 % flush       ??? hydrALAZINE (APRESOLINE) tablet 50 mg  50 mg Oral TID   ??? lisinopril (PRINIVIL, ZESTRIL) tablet 40 mg  40 mg Oral DAILY   ??? metoprolol (LOPRESSOR) tablet 100 mg  100 mg Oral BID   ??? cloNIDine (CATAPRES) tablet 0.1 mg  0.1 mg Oral NOW   ??? HYDROmorphone (DILAUDID) injection 3 mg  3 mg IntraVENous Q2H PRN   ??? insulin glargine (LANTUS) injection 5 Units  5 Units SubCUTAneous DAILY   ??? 0.9% sodium chloride infusion  25 mL/hr IntraVENous CONTINUOUS   ??? acetaminophen (TYLENOL) tablet 650 mg  650 mg Oral Q4H PRN   ??? amLODIPine (NORVASC) tablet 10 mg  10 mg Oral DAILY   ??? metoclopramide HCl (REGLAN) injection 10 mg  10 mg IntraVENous Q6H PRN   ??? hydrALAZINE (APRESOLINE) tablet 10 mg  10 mg Oral Q6H PRN   ??? insulin lispro (HUMALOG) injection   SubCUTAneous TIDAC   ??? calcium acetate (PHOSLO) tablet 1,334 mg  2 Tab Oral TID WITH MEALS   ??? sodium chloride (NS) flush 5-10 mL  5-10 mL IntraVENous Q8H   ??? sodium chloride (NS) flush 5-10 mL  5-10 mL IntraVENous PRN   ??? heparin (porcine) injection 5,000 Units  5,000 Units SubCUTAneous Q8H   ??? 0.9% sodium chloride infusion  75 mL/hr IntraVENous CONTINUOUS   ??? B complex-vitaminC-folic acid (NEPHROCAP) cap  1 Cap Oral DAILY   ??? diphenhydrAMINE (BENADRYL) injection 12.5 mg  12.5 mg IntraVENous Q6H PRN   ??? nicotine (NICODERM CQ) 14 mg/24 hr patch 1 Patch  1  Patch TransDERmal Q24H   ??? glucose chewable tablet 16 g  4 Tab Oral PRN   ??? dextrose (D50W) injection Syrg 12.5-25 g  12.5-25 g IntraVENous PRN   ??? glucagon (GLUCAGEN) injection 1 mg  1 mg IntraMUSCular PRN        Review of Systems:  Pertinent items are noted in HPI.    Objective:  Vitals:    Filed Vitals:    06/05/12 2024 06/05/12 2244 06/06/12 0043 06/06/12 0355   BP: 160/100 170/96 151/81 166/86   Pulse: 76 73 70 72   Temp:   98.3 ??F (36.8 ??C) 97.7 ??F (36.5 ??C)   Resp:   16 16   Height:       Weight:       SpO2:   95% 97%     Intake and Output:     06/17 1900 - 06/19 0659  In: 636 [P.O.:636]  Out: -  Physical Examination:  General: Mildly distressed from pain   Neck:  Supple, no mass  Resp:  Lungs CTA B/L, no wheezing , normal respiratory effort  CV:  RRR,  no murmur or rub,trace  LE edema  GI:  Soft, NT, + Bowel sounds, no hepatosplenomegaly  Neurologic:  Non focal  Psych:             AAO x 3 appropriate affect   Skin:  No Rash. Multiple acneform lesions on face       []     High complexity decision making was performed  []     Patient is at high-risk of decompensation with multiple organ involvement    Lab Data Personally Reviewed: I have reviewed all the pertinent labs, microbiology data and radiology studies during assessment.    No results found for this basename: NA:3,K:3,CL:3,CO2:3,GLU:3,BUN:3,CREA:3,CA:3,MG:3,PHOS:3,ALB:3,TBIL:3,SGOT:3,ALT:3,INR:3 in the last 72 hours  Recent Labs   Basename 06/04/12 1030 06/03/12 1340    WBC 3.7* 2.7*    HGB 8.4* 8.9*    HCT 25.9* 27.8*    PLT 141* 136*     Lab Results   Component Value Date/Time    Specimen Description: BLOOD 05/24/2012 10:47 AM     Lab Results   Component Value Date/Time    Culture result: NO GROWTH 5 DAYS 05/24/2012 10:47 AM     Recent Results (from the past 24 hour(s))   GLUCOSE, POC    Collection Time    06/05/12  9:52 AM       Component Value Range    POC GLUCOSE 92  75 - 110 mg/dL    Performed by Loyal Gambler     GLUCOSE, POC    Collection Time     06/05/12 12:18 PM       Component Value Range    POC GLUCOSE 132 (*) 75 - 110 mg/dL    Performed by Huey Romans     GLUCOSE, POC    Collection Time    06/05/12  4:20 PM       Component Value Range    POC GLUCOSE 71 (*) 75 - 110 mg/dL    Performed by Huey Romans     GLUCOSE, POC    Collection Time    06/05/12  5:32 PM       Component Value Range    POC GLUCOSE 82  75 - 110 mg/dL    Performed by Huey Romans     GLUCOSE, POC    Collection Time    06/05/12  7:41 PM       Component Value Range    POC GLUCOSE 124 (*) 75 - 110 mg/dL    Performed by Huey Romans     GLUCOSE, POC    Collection Time    06/06/12  6:08 AM       Component Value Range    POC GLUCOSE 98  75 - 110 mg/dL    Performed by Rod Holler         Total time spent with patient:  15  min.                               Care Plan discussed with:  Patient  x   Family      RN  x    Consulting Physician /Specialist        I have reviewed the flowsheets.  Chart reviewed. Pertinent Notes reviewed.  Current Medications list reviewed by me.  No change in PMH ,family and social history from Consult note.    Rhae Hammock, MD  06/06/2012    [facility]  [address],   [place], [place] [address]  Phone - [phone]   Fax: [phone]  [email or web address]

## 2012-06-06 NOTE — Progress Notes (Addendum)
Hospitalist Progress Note          Glori Luis, MD  PAGER [phone]  Call physician on-call through the operator 7pm-7am    Daily Progress Note: 06/06/2012    Assessment/Plan:     1. LLB pneumonia, poa- On zosyn and Levaquin. Zosyn stopped per ID, Follow Cx , no growth thus far,completed levaquin course  2. Sickle cell crisis, poa - encourge po hydration, 02 as needed . Continue Hydrea.To follow closely and adjust as appropriate, added zofran for nausea, pt off pca ,not helping , started on iv prn , increased to q 2 hrs   3. ESRD on HD - HD as per nephrology   4. Anemia - Hb 7.5-7.9 , transfuse 1 unit 6/12 for hb on 7.2 with HD But clotted, hematology f/up , cont to monitor, 1 unit on 6/13,and repeat .awiting transfusion today 06/04/12.  5. Hyponatremia - monitor, stable   HTN- BP consistently higher, , pain contributing ? ,increased norvasc to 10 , still not better controlled Agree with nephrology increase lisinopril., increased metoprolol to 25 mg during this crisis , pain control and reassess   6. DM- lantus, ssi , had hypoglycemia 6/12. Resolved.  7. Tobaco abuse - nicotine patch, counseled   8. Chronic pain - pain management as per hematology   9. Thrombocytopenia: watch closely.  10. DVT prophylaxis- heparin  11. Plan- Home on po abx soon once pain controlled   12. Case d/w patient , addressed pain & other issues to full satisfaction.              Subjective:   The patient is relatively pain free, requested to get dialysis in am before discharge .    Review of Systems:   Pertinent items are noted in HPI.    Objective:   Physical Exam:     BP 139/75   Pulse 68   Temp 99.6 ??F (37.6 ??C)   Resp 15   Ht 5\' 5"  (1.651 m)   Wt 203 lb 4.2 oz   BMI 33.82 kg/m2   SpO2 97% O2 Flow Rate (L/min): 2 l/min O2 Device: Room air    Temp (24hrs), Avg:98.3 ??F (36.8 ??C), Min:97.7 ??F (36.5 ??C), Max:99.6 ??F (37.6 ??C)    06/19 0700 - 06/19 1859  In: 120 [P.O.:120]  Out: 250 [Urine:250]   06/17 1900  - 06/19 0659  In: 636 [P.O.:636]  Out: -     General:  Alert, cooperative, no distress, appears stated age.   Lungs:   Clear to auscultation bilaterally.   Chest wall:  No tenderness or deformity.   Heart:  Regular rate and rhythm, S1, S2 normal, no murmur, click, rub or gallop.   Abdomen:   Soft, non-tender. Bowel sounds normal. No masses,  No organomegaly.   Extremities: Extremities chronic venous stasis changes noted.l, atraumatic, no cyanosis or edema.   Pulses: 2+ and symmetric all extremities.   Skin: Skin color, texture, turgor normal. No rashes or lesions   Neurologic: CNII-XII intact.      Data Review:       Recent Days:  Recent Labs   Basename 06/06/12 1410 06/04/12 1030    WBC 2.7* 3.7*    HGB 7.9* 8.4*    HCT 24.9* 25.9*    PLT 103* 141*     No results found for this basename: NA:3,K:3,CL:3,CO2:3,GLU:3,BUN:3,CREA:3,CA:3,MG:3,PHOS:3,ALB:3,TBIL:3,SGOT:3,ALT:3,INR:3 in the last 72 hours  No results found for this basename: PH:3,PCO2:3,PO2:3,HCO3:3,FIO2:3 in the last 72 hours    24 Hour  Results:  Recent Results (from the past 24 hour(s))   GLUCOSE, POC    Collection Time    06/05/12  7:41 PM       Component Value Range    POC GLUCOSE 124 (*) 75 - 110 mg/dL    Performed by Huey Romans     GLUCOSE, POC    Collection Time    23-Jun-2012  6:08 AM       Component Value Range    POC GLUCOSE 98  75 - 110 mg/dL    Performed by Rod Holler     GLUCOSE, POC    Collection Time    06-23-2012 11:46 AM       Component Value Range    POC GLUCOSE 102  75 - 110 mg/dL    Performed by GRUBBS JEAN     CBC WITH AUTOMATED DIFF    Collection Time    2012-06-23  2:10 PM       Component Value Range    WBC 2.7 (*) 4.1 - 11.1 K/uL    RBC 2.64 (*) 4.10 - 5.70 M/uL    HGB 7.9 (*) 12.1 - 17.0 g/dL    HCT 66.4 (*) 40.3 - 50.3 %    MCV 94.3  80.0 - 99.0 FL    MCH 29.9  26.0 - 34.0 PG    MCHC 31.7  30.0 - 36.5 g/dL    RDW 47.4 (*) 25.9 - 14.5 %    PLATELET 103 (*) 150 - 400 K/uL    NEUTROPHILS 54  32 - 75 %    LYMPHOCYTES 28  12 - 49 %     MONOCYTES 13  5 - 13 %    EOSINOPHILS 5  0 - 7 %    BASOPHILS 0  0 - 1 %    ABS. NEUTROPHILS 1.4 (*) 1.8 - 8.0 K/UL    ABS. LYMPHOCYTES 0.8  0.8 - 3.5 K/UL    ABS. MONOCYTES 0.4  0.0 - 1.0 K/UL    ABS. EOSINOPHILS 0.1  0.0 - 0.4 K/UL    ABS. BASOPHILS 0.0  0.0 - 0.1 K/UL    DF SMEAR SCANNED      RBC COMMENTS        Value: BASOPHILIC STIPPLING PRESENT      OVALOCYTES PRESENT   GLUCOSE, POC    Collection Time    2012/06/23  3:46 PM       Component Value Range    POC GLUCOSE 71 (*) 75 - 110 mg/dL    Performed by Johny Shock     GLUCOSE, POC    Collection Time    2012-06-23  4:05 PM       Component Value Range    POC GLUCOSE 90  75 - 110 mg/dL    Performed by Johny Shock         Problem List:  Problem List as of 06/23/2012  Never Reviewed      Codes Class Noted - Resolved    *Sickle cell crisis 282.62  05/24/2012 - Present              Medications reviewed  Current Facility-Administered Medications   Medication Dose Route Frequency   ??? sodium chloride (NS) 0.9 % flush       ??? sodium chloride (NS) 0.9 % flush       ??? sodium chloride (NS) 0.9 % flush       ??? sodium chloride (NS) 0.9 % flush       ???  sodium chloride (NS) 0.9 % flush       ??? sodium chloride (NS) 0.9 % flush       ??? hydrALAZINE (APRESOLINE) tablet 50 mg  50 mg Oral TID   ??? heparin (porcine) 1,000 unit/mL injection 3,500 Units  3,500 Units IntraVENous ONCE   ??? lisinopril (PRINIVIL, ZESTRIL) tablet 40 mg  40 mg Oral DAILY   ??? metoprolol (LOPRESSOR) tablet 100 mg  100 mg Oral BID   ??? cloNIDine (CATAPRES) tablet 0.1 mg  0.1 mg Oral NOW   ??? HYDROmorphone (DILAUDID) injection 3 mg  3 mg IntraVENous Q2H PRN   ??? insulin glargine (LANTUS) injection 5 Units  5 Units SubCUTAneous DAILY   ??? 0.9% sodium chloride infusion  25 mL/hr IntraVENous CONTINUOUS   ??? acetaminophen (TYLENOL) tablet 650 mg  650 mg Oral Q4H PRN   ??? amLODIPine (NORVASC) tablet 10 mg  10 mg Oral DAILY   ??? metoclopramide HCl (REGLAN) injection 10 mg  10 mg IntraVENous Q6H PRN   ??? hydrALAZINE  (APRESOLINE) tablet 10 mg  10 mg Oral Q6H PRN   ??? insulin lispro (HUMALOG) injection   SubCUTAneous TIDAC   ??? calcium acetate (PHOSLO) tablet 1,334 mg  2 Tab Oral TID WITH MEALS   ??? sodium chloride (NS) flush 5-10 mL  5-10 mL IntraVENous Q8H   ??? sodium chloride (NS) flush 5-10 mL  5-10 mL IntraVENous PRN   ??? heparin (porcine) injection 5,000 Units  5,000 Units SubCUTAneous Q8H   ??? 0.9% sodium chloride infusion  75 mL/hr IntraVENous CONTINUOUS   ??? B complex-vitaminC-folic acid (NEPHROCAP) cap  1 Cap Oral DAILY   ??? diphenhydrAMINE (BENADRYL) injection 12.5 mg  12.5 mg IntraVENous Q6H PRN   ??? nicotine (NICODERM CQ) 14 mg/24 hr patch 1 Patch  1 Patch TransDERmal Q24H   ??? glucose chewable tablet 16 g  4 Tab Oral PRN   ??? dextrose (D50W) injection Syrg 12.5-25 g  12.5-25 g IntraVENous PRN   ??? glucagon (GLUCAGEN) injection 1 mg  1 mg IntraMUSCular PRN       Care Plan discussed with: Patient/Family and Nurse    Total time spent with patient: 30 minutes.    Arnetha Massy, MD

## 2012-06-06 NOTE — Procedures (Signed)
Procedures  by Coralee Pesa, RN at 06/06/12 1959                Author: Coralee Pesa, RN  Service: --  Author Type: Registered Nurse       Filed: 06/06/12 2016  Date of Service: 06/06/12 1959  Status: Signed          Editor: Dimain, Milana Obey, RN (Registered Nurse)            Procedures        1. HEMODIALYSIS INPATIENT [DIA12 (Custom)]                                                  [facility]                         [phone]          Vitals  Pre  Post  Assessment  Pre  Post            BP  173/90  161/78  LOC  Alert,orientedx3.  Alert,orientedx3.            HR  73  76  Lungs  CTA  CTA            Temp  98.2  98  Cardiac  RRR  RRR     Resp  16  16  Skin  Warm,very dry, scaly.  Warm,very dry,scaly.     Weight  98.2kg  95.5kg  Edema  4+ both legs.  4+ both legs.                  Pain  Medicated by floor rn.  Medicated by floor rn.          Orders             Duration:  Start:  1445  End:  1845  Total:  4 hours.        Dialyzer:  Revaclear.     K Bath:  3     Ca Bath:  2     Na / Bicarb:  140/40        Target Fluid Removal:  3500 ml ( including ns rinses).          Access        Type & Location:  Right upper arm avf.       Comments:     Avf cannulated with G15 needles without difficulty. Avf had good blood flow and no signs of infection noted. Small amount of bleeding from venous needle site post dialysis. Moderate amount of bleeding from arterial needle site post dialysis. Pressure  dressings applied to both sites post dialysis after bleeding had stopped.                                  Labs        Hep B status / date:  sAB positive/ 01/24/12.        Obtained/Reviewed   Critical Results Called  Obtained and reviewed.   no          Meds Given         Name  Dose  Route  Heparin 1000 units/ml.  2000 units iv bolus, then 500 units /hour x3 hours. Off last hour.  Maintenance iv.                                     Total Liters Process:          Net Fluid Removed:  3000 ml.            Comments        Time Out Done:  Yes/ 1430.     Primary Nurse Rpt Pre:  Joylene Grapes, RN.     Primary Nurse Rpt Post:  Marca Ancona, RN.        Pt Education:  None.        Tx Summary:  Tolerated hd fairly well. Was on nasal O2. No sob noted. Returned to his room in nad.

## 2012-06-06 NOTE — Progress Notes (Signed)
Bedside shift change report given to Jean RN (oncoming nurse) by Esther RN (offgoing nurse).  Report given with SBAR, Kardex, Intake/Output and MAR.

## 2012-06-06 NOTE — Procedures (Signed)
[facility]                         [phone]  Vitals Pre Post Assessment Pre Post   BP 173/90 161/78 LOC Alert,orientedx3. Alert,orientedx3.   HR 73 76 Lungs CTA CTA   Temp 98.2 98 Cardiac RRR RRR   Resp 16 16 Skin Warm,very dry, scaly. Warm,very dry,scaly.   Weight 98.2kg 95.5kg Edema 4+ both legs. 4+ both legs.      Pain Medicated by floor rn. Medicated by floor rn.     Orders   Duration: Start: 1445 End: 1845 Total: 4 hours.   Dialyzer: Revaclear.   K Bath: 3   Ca Bath: 2   Na / Bicarb: 140/40   Target Fluid Removal: 3500 ml ( including ns rinses).     Access   Type & Location: Right upper arm avf.   Comments:    Avf cannulated with G15 needles without difficulty. Avf had good blood flow and no signs of infection noted. Small amount of bleeding from venous needle site post dialysis. Moderate amount of bleeding from arterial needle site post dialysis. Pressure dressings applied to both sites post dialysis after bleeding had stopped.                             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   Obtained/Reviewed  Critical Results Called Obtained and reviewed.  no     Meds Given   Name Dose Route   Heparin 1000 units/ml. 2000 units iv bolus, then 500 units /hour x3 hours. Off last hour. Maintenance iv.               Total Liters Process:    Net Fluid Removed: 3000 ml.      Comments   Time Out Done: Yes/ 1430.   Primary Nurse Rpt Pre: Joylene Grapes, RN.   Primary Nurse Rpt Post: Marca Ancona, RN.   Pt Education: None.   Tx Summary: Tolerated hd fairly well. Was on nasal O2. No sob noted. Returned to his room in nad.

## 2012-06-06 NOTE — Progress Notes (Signed)
Hematology Oncology Progress Note    Follow up for sickle crisis.    Chart notes reviewed since last visit.    Pain control is adequate - patient not ready to go to p.o. analgesics.    Vital signs from last 24 hours reviewed.    Review of Systems:  Pertinent items are noted in HPI.    Physical Examination: chest clear, cor rrr, abdomen soft & nontender.    Laboratory & radiographic results from last 24 hours reviewed.    Assessment & Plan:    Sickle crisis.  Anemia. H/H to be checked at today's HD, & RBC transfused prn.

## 2012-06-06 NOTE — Progress Notes (Signed)
Pt returned to unit at 1940 from dialysis. No report was called regarding dialysis or pt's return to unit. Report was called at 2015. BMP was not drawn on patient as the order was missed. From report, pt had 3L drawn off. Heparin was given during treatment to prevent clotting. Blood found on linens was reportedly from the AVf bleeding post needle removal. [name] blood pressure remained high throughout the treatment. Had been covered with medication upon return to the unit.

## 2012-06-06 NOTE — Progress Notes (Signed)
NUTRITION       Chart reviewed, patient requesting additional menu options.  Meds reviewed, on neprocaps and phoslo. BG levels reviewed, on SSI and lantus.  Patient with good appetite, consuming meals but complaints of no options. Diet changed to Diabetic diet if effort to provide patient with additional options.  Monitor renal labs and adjust diet as needed.  RD to follow.      Georgiana Shore, RD

## 2012-06-06 NOTE — Other (Signed)
DTC Progress Note    Recommendations/ Comments: If appropriate, please consider running dextrose if hypoglycemia persists.  Pt with hypoglycemia even when Lantus dose refused and no insulin correction received (on both dialysis and non-dialysis days).  Given low Hgb and ESRD, and ?variable intake - pt at very high risk of hypoglycemia.  Because pt is type 1 does need some insulin on board to prevent DKA, however, at this point recommend decrease of Lantus to 2 units.  (5 units received this morning on 6/19)    Chart reviewed on [employer].    Patient is a 31 y.o. male with known DM on 8 units Lantus + correction scale at home.    A1c:   No results found for this basename: HBA1C, HGBE8       POC Glucose last 24hrs:   Lab Results   Component Value Date/Time    POC GLUCOSE 98 06/06/2012  6:08 AM    POC GLUCOSE 124 06/05/2012  7:41 PM    POC GLUCOSE 82 06/05/2012  5:32 PM    POC GLUCOSE 71 06/05/2012  4:20 PM    POC GLUCOSE 132 06/05/2012 12:18 PM    POC GLUCOSE 92 06/05/2012  9:52 AM    POC GLUCOSE 105 06/05/2012  7:30 AM    POC GLUCOSE 109 06/05/2012  3:26 AM         Lab Results   Component Value Date/Time    Creatinine 4.85 05/31/2012  3:55 AM       PO intake: No data found.        Current hospital DM medications: 5 units Lantus, insulin correction scale >200 mg/dL    Will continue to follow as needed.    Thank you.  Arthur Holms RD CDE

## 2012-06-07 LAB — METABOLIC PANEL, BASIC
Anion gap: 7 mmol/L (ref 5–15)
BUN/Creatinine ratio: 6 — ABNORMAL LOW (ref 12–20)
BUN: 38 MG/DL — ABNORMAL HIGH (ref 6–20)
CO2: 23 MMOL/L (ref 21–32)
Calcium: 8.3 MG/DL — ABNORMAL LOW (ref 8.5–10.1)
Chloride: 106 MMOL/L (ref 97–108)
Creatinine: 5.97 MG/DL — ABNORMAL HIGH (ref 0.45–1.15)
GFR est AA: 13 mL/min/{1.73_m2} — ABNORMAL LOW (ref >60)
GFR est non-AA: 11 mL/min/{1.73_m2} — ABNORMAL LOW (ref >60)
Glucose: 76 MG/DL (ref 65–100)
Potassium: 6.1 MMOL/L — ABNORMAL HIGH (ref 3.5–5.1)
Sodium: 136 MMOL/L (ref 136–145)

## 2012-06-07 LAB — CBC WITH AUTOMATED DIFF
ABS. BASOPHILS: 0 10*3/uL (ref 0.0–0.1)
ABS. EOSINOPHILS: 0.2 10*3/uL (ref 0.0–0.4)
ABS. LYMPHOCYTES: 0.7 10*3/uL — ABNORMAL LOW (ref 0.8–3.5)
ABS. MONOCYTES: 0.3 10*3/uL (ref 0.0–1.0)
ABS. NEUTROPHILS: 1.7 10*3/uL — ABNORMAL LOW (ref 1.8–8.0)
BASOPHILS: 1 % (ref 0–1)
EOSINOPHILS: 6 % (ref 0–7)
HCT: 24.9 % — ABNORMAL LOW (ref 36.6–50.3)
HGB: 7.9 g/dL — ABNORMAL LOW (ref 12.1–17.0)
LYMPHOCYTES: 25 % (ref 12–49)
MCH: 30.4 PG (ref 26.0–34.0)
MCHC: 31.7 g/dL (ref 30.0–36.5)
MCV: 95.8 FL (ref 80.0–99.0)
MONOCYTES: 11 % (ref 5–13)
NEUTROPHILS: 57 % (ref 32–75)
PLATELET: 97 10*3/uL — ABNORMAL LOW (ref 150–400)
RBC: 2.6 M/uL — ABNORMAL LOW (ref 4.10–5.70)
RDW: 14.6 % — ABNORMAL HIGH (ref 11.5–14.5)
WBC: 2.9 10*3/uL — ABNORMAL LOW (ref 4.1–11.1)

## 2012-06-07 LAB — GLUCOSE, POC
Glucose (POC): 103 mg/dL (ref 75–110)
Glucose (POC): 72 mg/dL — ABNORMAL LOW (ref 75–110)
Glucose (POC): 74 mg/dL — ABNORMAL LOW (ref 75–110)
Glucose (POC): 89 mg/dL (ref 75–110)
Glucose (POC): 95 mg/dL (ref 75–110)
Glucose (POC): 97 mg/dL (ref 75–110)
Glucose (POC): 98 mg/dL (ref 75–110)

## 2012-06-07 MED ADMIN — HYDROmorphone (DILAUDID) injection 3 mg: INTRAVENOUS | @ 18:00:00 | NDC 00641012121

## 2012-06-07 MED ADMIN — metoprolol (LOPRESSOR) tablet 100 mg: ORAL | @ 02:00:00 | NDC 62584026611

## 2012-06-07 MED ADMIN — sodium chloride (NS) 0.9 % flush: @ 06:00:00 | NDC 87701099893

## 2012-06-07 MED ADMIN — hydrALAZINE (APRESOLINE) tablet 50 mg: ORAL | @ 02:00:00 | NDC 50111032801

## 2012-06-07 MED ADMIN — heparin (porcine) 1,000 unit/mL injection 1,000 Units: ARTERIOVENOUS_FISTULA | @ 16:00:00 | NDC 25021040001

## 2012-06-07 MED ADMIN — calcium acetate (PHOSLO) tablet 1,334 mg: ORAL | NDC 00574011302

## 2012-06-07 MED ADMIN — HYDROmorphone (DILAUDID) injection 3 mg: INTRAVENOUS | @ 06:00:00 | NDC 00641012121

## 2012-06-07 MED ADMIN — metoprolol (LOPRESSOR) tablet 100 mg: ORAL | @ 12:00:00 | NDC 62584026611

## 2012-06-07 MED ADMIN — HYDROmorphone (DILAUDID) injection 3 mg: INTRAVENOUS | @ 04:00:00 | NDC 00641012121

## 2012-06-07 MED ADMIN — hydrALAZINE (APRESOLINE) tablet 50 mg: ORAL | @ 12:00:00 | NDC 62584073411

## 2012-06-07 MED ADMIN — calcium acetate (PHOSLO) tablet 1,334 mg: ORAL | @ 12:00:00 | NDC 00574011302

## 2012-06-07 MED ADMIN — lisinopril (PRINIVIL, ZESTRIL) tablet 40 mg: ORAL | @ 12:00:00 | NDC 68084006211

## 2012-06-07 MED ADMIN — heparin (porcine) injection 5,000 Units: SUBCUTANEOUS | @ 02:00:00 | NDC 25021040201

## 2012-06-07 MED ADMIN — heparin (porcine) injection 5,000 Units: SUBCUTANEOUS | @ 18:00:00 | NDC 63323026201

## 2012-06-07 MED ADMIN — sodium chloride (NS) flush 5-10 mL: INTRAVENOUS | @ 18:00:00 | NDC 82903065462

## 2012-06-07 MED ADMIN — HYDROmorphone (DILAUDID) injection 3 mg: INTRAVENOUS | @ 02:00:00 | NDC 00641012121

## 2012-06-07 MED ADMIN — epoetin alfa (EPOGEN;PROCRIT) injection 20,000 Units: SUBCUTANEOUS | @ 16:00:00 | NDC 59676031000

## 2012-06-07 MED ADMIN — amLODIPine (NORVASC) tablet 10 mg: ORAL | @ 12:00:00 | NDC 59762153006

## 2012-06-07 MED ADMIN — heparin (porcine) injection 5,000 Units: SUBCUTANEOUS | @ 10:00:00 | NDC 25021040201

## 2012-06-07 MED ADMIN — HYDROmorphone (DILAUDID) injection 3 mg: INTRAVENOUS | @ 23:00:00 | NDC 00641012121

## 2012-06-07 MED ADMIN — calcium acetate (PHOSLO) tablet 1,334 mg: ORAL | @ 18:00:00 | NDC 00574011302

## 2012-06-07 MED ADMIN — calcium acetate (PHOSLO) tablet 1,334 mg: ORAL | @ 21:00:00 | NDC 00574011302

## 2012-06-07 MED ADMIN — diphenhydrAMINE (BENADRYL) injection 12.5 mg: INTRAVENOUS | @ 12:00:00 | NDC 00641037621

## 2012-06-07 MED ADMIN — HYDROmorphone (DILAUDID) injection 3 mg: INTRAVENOUS | @ 12:00:00 | NDC 00641012121

## 2012-06-07 MED ADMIN — HYDROmorphone (DILAUDID) injection 3 mg: INTRAVENOUS | @ 14:00:00 | NDC 00641012121

## 2012-06-07 MED ADMIN — sodium chloride (NS) 0.9 % flush: @ 08:00:00 | NDC 87701099893

## 2012-06-07 MED ADMIN — diphenhydrAMINE (BENADRYL) injection 12.5 mg: INTRAVENOUS | @ 06:00:00 | NDC 00641037621

## 2012-06-07 MED ADMIN — sodium chloride (NS) flush 5-10 mL: INTRAVENOUS | @ 02:00:00 | NDC 87701099893

## 2012-06-07 MED ADMIN — HYDROmorphone (DILAUDID) injection 3 mg: INTRAVENOUS | @ 08:00:00 | NDC 00641012121

## 2012-06-07 MED ADMIN — insulin glargine (LANTUS) injection 5 Units: SUBCUTANEOUS | @ 16:00:00 | NDC 09999222001

## 2012-06-07 MED ADMIN — hydrALAZINE (APRESOLINE) tablet 100 mg: ORAL | @ 20:00:00 | NDC 62584073411

## 2012-06-07 MED ADMIN — hydrALAZINE (APRESOLINE) tablet 50 mg: ORAL | NDC 50111032801

## 2012-06-07 MED ADMIN — HYDROmorphone (DILAUDID) injection 3 mg: INTRAVENOUS | @ 10:00:00 | NDC 00641012121

## 2012-06-07 MED ADMIN — diphenhydrAMINE (BENADRYL) injection 12.5 mg: INTRAVENOUS | @ 18:00:00 | NDC 00641037621

## 2012-06-07 MED ADMIN — HYDROmorphone (DILAUDID) injection 3 mg: INTRAVENOUS | @ 16:00:00 | NDC 00641012121

## 2012-06-07 MED ADMIN — sodium chloride (NS) flush 5-10 mL: INTRAVENOUS | @ 10:00:00 | NDC 87701099893

## 2012-06-07 MED ADMIN — sodium chloride (NS) flush 5-10 mL: INTRAVENOUS | @ 04:00:00 | NDC 87701099893

## 2012-06-07 MED ADMIN — HYDROmorphone (DILAUDID) injection 3 mg: INTRAVENOUS | @ 21:00:00 | NDC 00641012121

## 2012-06-07 MED ADMIN — glucose chewable tablet 16 g: ORAL | @ 20:00:00

## 2012-06-07 MED FILL — HYDROMORPHONE 2 MG/ML INJECTION SOLUTION: 2 mg/mL | INTRAMUSCULAR | Qty: 2

## 2012-06-07 MED FILL — HYDRALAZINE 50 MG TAB: 50 mg | ORAL | Qty: 1

## 2012-06-07 MED FILL — DIPHENHYDRAMINE HCL 50 MG/ML IJ SOLN: 50 mg/mL | INTRAMUSCULAR | Qty: 1

## 2012-06-07 MED FILL — EPOETIN ALFA 20,000 UNIT/ML IJ SOLN: 20000 unit/mL | INTRAMUSCULAR | Qty: 1

## 2012-06-07 MED FILL — NEPHROCAPS 1 MG CAPSULE: 1 mg | ORAL | Qty: 1

## 2012-06-07 MED FILL — BD POSIFLUSH NORMAL SALINE 0.9 % INJECTION SYRINGE: INTRAMUSCULAR | Qty: 10

## 2012-06-07 MED FILL — BD POSIFLUSH NORMAL SALINE 0.9 % INJECTION SYRINGE: INTRAMUSCULAR | Qty: 20

## 2012-06-07 MED FILL — HEPARIN (PORCINE) 1,000 UNIT/ML IJ SOLN: 1000 unit/mL | INTRAMUSCULAR | Qty: 1

## 2012-06-07 MED FILL — CALCIUM ACETATE 667 MG TAB: 667 mg | ORAL | Qty: 2

## 2012-06-07 MED FILL — INSULIN GLARGINE 100 UNIT/ML INJECTION: 100 unit/mL | SUBCUTANEOUS | Qty: 0.05

## 2012-06-07 MED FILL — BD POSIFLUSH NORMAL SALINE 0.9 % INJECTION SYRINGE: INTRAMUSCULAR | Qty: 30

## 2012-06-07 MED FILL — METOPROLOL TARTRATE 50 MG TAB: 50 mg | ORAL | Qty: 2

## 2012-06-07 MED FILL — AMLODIPINE 5 MG TAB: 5 mg | ORAL | Qty: 2

## 2012-06-07 MED FILL — PROCRIT 10,000 UNIT/ML INJECTION SOLUTION: 10000 unit/mL | INTRAMUSCULAR | Qty: 2

## 2012-06-07 MED FILL — HYDRALAZINE 50 MG TAB: 50 mg | ORAL | Qty: 2

## 2012-06-07 MED FILL — HEPARIN (PORCINE) 5,000 UNIT/ML IJ SOLN: 5000 unit/mL | INTRAMUSCULAR | Qty: 1

## 2012-06-07 MED FILL — BD POSIFLUSH NORMAL SALINE 0.9 % INJECTION SYRINGE: INTRAMUSCULAR | Qty: 50

## 2012-06-07 MED FILL — GLUCOSE 4 GRAM CHEWABLE TAB: 4 gram | ORAL | Qty: 10

## 2012-06-07 MED FILL — INSULIN GLARGINE 100 UNIT/ML INJECTION: 100 unit/mL | SUBCUTANEOUS | Qty: 0.02

## 2012-06-07 MED FILL — NICOTINE 14 MG/24 HR DAILY PATCH: 14 mg/24 hr | TRANSDERMAL | Qty: 1

## 2012-06-07 MED FILL — LISINOPRIL 20 MG TAB: 20 mg | ORAL | Qty: 2

## 2012-06-07 NOTE — Progress Notes (Signed)
Vascular Surgery Consult  --full note to follow  --in brief, 31 yo male with ESRD, DM, sickle cell crisis, who has developed excessive bleeding following dialysis from his right arm fistula, raising possibility of venous outflow obstruction; will plan for fistulogram, possible angioplasty in am

## 2012-06-07 NOTE — Progress Notes (Addendum)
Hospitalist Progress Note          Glori Luis, MD  PAGER [phone]  Call physician on-call through the operator 7pm-7am    Daily Progress Note: 06/07/2012    Assessment/Plan:     1. LLB pneumonia, poa- On zosyn and Levaquin. Zosyn stopped per ID, Follow Cx , no growth thus far,completed levaquin course  2. Sickle cell crisis, poa - encourge po hydration, 02 as needed . Continue Hydrea.To follow closely and adjust as appropriate, added zofran for nausea, pt off pca ,not helping , started on iv prn , increased to q 2 hrs   3. ESRD on HD - HD as per nephrology . Agree wit fistulogram for suspected clot in am.  4.   5. Anemia - Hb 7.5-7.9 , transfuse 1 unit 6/12 for hb on 7.2 with HD But clotted, hematology f/up , cont to monitor, 1 unit on 6/13,and repeat .awiting transfusion today 06/04/12.  6. Hyponatremia - monitor, stable   HTN- BP consistently higher, , pain contributing ? ,increased norvasc to 10 , still not better controlled Agree with nephrology increase lisinopril., increased metoprolol to 25 mg during this crisis , pain control and reassess   6. DM- lantus, ssi , had hypoglycemia 6/12. Resolved.  7. Tobaco abuse - nicotine patch, counseled   8. Chronic pain - pain management as per hematology   9. Thrombocytopenia: watch closely.suspect clot induced consumption vs dialysis related.  10. DVT prophylaxis- heparin  11. Plan- Home once fistulogram done.  12. Case d/w patient , addressed pain & other issues to full satisfaction.              Subjective:   The patient is stable, going for fistulo gram in am.pain well controlled.     Review of Systems:   Pertinent items are noted in HPI.    Objective:   Physical Exam:     BP 138/84   Pulse 75   Temp 98.4 ??F (36.9 ??C)   Resp 16   Ht 5\' 5"  (1.651 m)   Wt 203 lb 4.2 oz   BMI 33.82 kg/m2   SpO2 93% O2 Flow Rate (L/min): 2 l/min O2 Device: Room air    Temp (24hrs), Avg:98.1 ??F (36.7 ??C), Min:97.7 ??F (36.5 ??C), Max:98.4 ??F (36.9 ??C)         06/18 1900 - 06/20 0659  In: 240 [P.O.:240]  Out: 3250 [Urine:250]    General:  Alert, cooperative, no distress, appears stated age.   Lungs:   Clear to auscultation bilaterally.   Chest wall:  No tenderness or deformity.   Heart:  Regular rate and rhythm, S1, S2 normal, no murmur, click, rub or gallop.   Abdomen:   Soft, non-tender. Bowel sounds normal. No masses,  No organomegaly.   Extremities: Extremities normal, atraumatic, no cyanosis +1 Edema noted b/l .fistula noted.upper extremity.   Pulses: 2+ and symmetric all extremities.   Skin: Skin color, texture, turgor normal. Venous stasis changes noted in b/l.   Neurologic:  grossly intact.     Data Review:       Recent Days:  Recent Labs   [facility] 06/07/12 0315 06/06/12 1410    WBC 2.9* 2.7*    HGB 7.9* 7.9*    HCT 24.9* 24.9*    PLT 97* 103*     Recent Labs   Basename 06/07/12 0315    NA 136    K 6.1*    CL 106    CO2 23  GLU 76    BUN 38*    CREA 5.97*    CA 8.3*    MG --    PHOS --    ALB --    TBIL --    SGOT --    INR --     No results found for this basename: PH:3,PCO2:3,PO2:3,HCO3:3,FIO2:3 in the last 72 hours    24 Hour Results:  Recent Results (from the past 24 hour(s))   CBC WITH AUTOMATED DIFF    Collection Time    06/06/12  2:10 PM       Component Value Range    WBC 2.7 (*) 4.1 - 11.1 K/uL    RBC 2.64 (*) 4.10 - 5.70 M/uL    HGB 7.9 (*) 12.1 - 17.0 g/dL    HCT 16.1 (*) 09.6 - 50.3 %    MCV 94.3  80.0 - 99.0 FL    MCH 29.9  26.0 - 34.0 PG    MCHC 31.7  30.0 - 36.5 g/dL    RDW 04.5 (*) 40.9 - 14.5 %    PLATELET 103 (*) 150 - 400 K/uL    NEUTROPHILS 54  32 - 75 %    LYMPHOCYTES 28  12 - 49 %    MONOCYTES 13  5 - 13 %    EOSINOPHILS 5  0 - 7 %    BASOPHILS 0  0 - 1 %    ABS. NEUTROPHILS 1.4 (*) 1.8 - 8.0 K/UL    ABS. LYMPHOCYTES 0.8  0.8 - 3.5 K/UL    ABS. MONOCYTES 0.4  0.0 - 1.0 K/UL    ABS. EOSINOPHILS 0.1  0.0 - 0.4 K/UL    ABS. BASOPHILS 0.0  0.0 - 0.1 K/UL    DF SMEAR SCANNED      RBC COMMENTS        Value: BASOPHILIC STIPPLING PRESENT       OVALOCYTES PRESENT   GLUCOSE, POC    Collection Time    06/06/12  3:46 PM       Component Value Range    POC GLUCOSE 71 (*) 75 - 110 mg/dL    Performed by Johny Shock     GLUCOSE, POC    Collection Time    06/06/12  4:05 PM       Component Value Range    POC GLUCOSE 90  75 - 110 mg/dL    Performed by Johny Shock     GLUCOSE, POC    Collection Time    06/06/12  8:22 PM       Component Value Range    POC GLUCOSE 74 (*) 75 - 110 mg/dL    Performed by Lorel Monaco     GLUCOSE, POC    Collection Time    06/06/12  8:53 PM       Component Value Range    POC GLUCOSE 95  75 - 110 mg/dL    Performed by Lorel Monaco     METABOLIC PANEL, BASIC    Collection Time    06/07/12  3:15 AM       Component Value Range    Sodium 136  136 - 145 MMOL/L    Potassium 6.1 (*) 3.5 - 5.1 MMOL/L    Chloride 106  97 - 108 MMOL/L    CO2 23  21 - 32 MMOL/L    Anion gap 7  5 - 15 mmol/L    Glucose 76  65 - 100 MG/DL  BUN 38 (*) 6 - 20 MG/DL    Creatinine 0.98 (*) 0.45 - 1.15 MG/DL    BUN/Creatinine ratio 6 (*) 12 - 20      GFR est-AA 13 (*) >60 ml/min/1.67m2    GFR est non-AA 11 (*) >60 ml/min/1.62m2    Calcium 8.3 (*) 8.5 - 10.1 MG/DL   CBC WITH AUTOMATED DIFF    Collection Time    June 16, 2012  3:15 AM       Component Value Range    WBC 2.9 (*) 4.1 - 11.1 K/uL    RBC 2.60 (*) 4.10 - 5.70 M/uL    HGB 7.9 (*) 12.1 - 17.0 g/dL    HCT 11.9 (*) 14.7 - 50.3 %    MCV 95.8  80.0 - 99.0 FL    MCH 30.4  26.0 - 34.0 PG    MCHC 31.7  30.0 - 36.5 g/dL    RDW 82.9 (*) 56.2 - 14.5 %    PLATELET 97 (*) 150 - 400 K/uL    NEUTROPHILS 57  32 - 75 %    LYMPHOCYTES 25  12 - 49 %    MONOCYTES 11  5 - 13 %    EOSINOPHILS 6  0 - 7 %    BASOPHILS 1  0 - 1 %    ABS. NEUTROPHILS 1.7 (*) 1.8 - 8.0 K/UL    ABS. LYMPHOCYTES 0.7 (*) 0.8 - 3.5 K/UL    ABS. MONOCYTES 0.3  0.0 - 1.0 K/UL    ABS. EOSINOPHILS 0.2  0.0 - 0.4 K/UL    ABS. BASOPHILS 0.0  0.0 - 0.1 K/UL   GLUCOSE, POC    Collection Time    2012/06/16  6:39 AM       Component Value Range    POC GLUCOSE 89  75 - 110 mg/dL     Performed by Dow Adolph     GLUCOSE, POC    Collection Time    2012/06/16 11:18 AM       Component Value Range    POC GLUCOSE 72 (*) 75 - 110 mg/dL    Performed by Huey Romans     GLUCOSE, POC    Collection Time    06/16/2012 12:21 PM       Component Value Range    POC GLUCOSE 97  75 - 110 mg/dL    Performed by GRUBBS JEAN         Problem List:  Problem List as of 06-16-12  Never Reviewed      Codes Class Noted - Resolved    *Sickle cell crisis 282.62  05/24/2012 - Present              Medications reviewed  Current Facility-Administered Medications   Medication Dose Route Frequency   ??? sodium chloride (NS) 0.9 % flush       ??? sodium chloride (NS) 0.9 % flush       ??? epoetin alfa (EPOGEN;PROCRIT) injection 20,000 Units  20,000 Units SubCUTAneous Q MON, WED & FRI   ??? hydrALAZINE (APRESOLINE) tablet 100 mg  100 mg Oral TID   ??? heparin (porcine) 1,000 unit/mL injection 1,000 Units  1,000 Units Hemodialysis ONCE   ??? insulin glargine (LANTUS) injection 2 Units  2 Units SubCUTAneous DAILY   ??? heparin (porcine) 1,000 unit/mL injection 3,500 Units  3,500 Units IntraVENous ONCE   ??? sodium chloride (NS) 0.9 % flush       ??? sodium chloride (NS) 0.9 % flush       ???  sodium chloride (NS) 0.9 % flush       ??? lisinopril (PRINIVIL, ZESTRIL) tablet 40 mg  40 mg Oral DAILY   ??? metoprolol (LOPRESSOR) tablet 100 mg  100 mg Oral BID   ??? HYDROmorphone (DILAUDID) injection 3 mg  3 mg IntraVENous Q2H PRN   ??? 0.9% sodium chloride infusion  25 mL/hr IntraVENous CONTINUOUS   ??? acetaminophen (TYLENOL) tablet 650 mg  650 mg Oral Q4H PRN   ??? amLODIPine (NORVASC) tablet 10 mg  10 mg Oral DAILY   ??? metoclopramide HCl (REGLAN) injection 10 mg  10 mg IntraVENous Q6H PRN   ??? hydrALAZINE (APRESOLINE) tablet 10 mg  10 mg Oral Q6H PRN   ??? insulin lispro (HUMALOG) injection   SubCUTAneous TIDAC   ??? calcium acetate (PHOSLO) tablet 1,334 mg  2 Tab Oral TID WITH MEALS   ??? sodium chloride (NS) flush 5-10 mL  5-10 mL IntraVENous Q8H   ??? sodium chloride (NS)  flush 5-10 mL  5-10 mL IntraVENous PRN   ??? heparin (porcine) injection 5,000 Units  5,000 Units SubCUTAneous Q8H   ??? 0.9% sodium chloride infusion  75 mL/hr IntraVENous CONTINUOUS   ??? B complex-vitaminC-folic acid (NEPHROCAP) cap  1 Cap Oral DAILY   ??? diphenhydrAMINE (BENADRYL) injection 12.5 mg  12.5 mg IntraVENous Q6H PRN   ??? nicotine (NICODERM CQ) 14 mg/24 hr patch 1 Patch  1 Patch TransDERmal Q24H   ??? glucose chewable tablet 16 g  4 Tab Oral PRN   ??? dextrose (D50W) injection Syrg 12.5-25 g  12.5-25 g IntraVENous PRN   ??? glucagon (GLUCAGEN) injection 1 mg  1 mg IntraMUSCular PRN       Care Plan discussed with: Patient/Family and Nurse    Total time spent with patient: 30 minutes.    Arnetha Massy, MD

## 2012-06-07 NOTE — Procedures (Signed)
Procedures  by Gaspar Garbe, RN at 06/07/12 1235                Author: Gaspar Garbe, RN  Service: --  Author Type: Registered Nurse       Filed: 06/07/12 1551  Date of Service: 06/07/12 1235  Status: Signed          Editor: Gaspar Garbe, RN (Registered Nurse)                                   [facility]                         [phone]          Vitals  Pre  Post  Assessment  Pre  Post            BP  140/77  167/90  LOC  A&OX3  A&OX3            HR  75  88  Lungs  Clear througout  clear            Temp  98.8  98.6  Cardiac  regular  regular     Resp  18  16  Skin  Warm, dry;scaly  Warm,dry, and scalyl     Weight  96.7 kg  93.2 kg  Edema  Lower ext +3;generalized trace    Lower ext. +3                  Pain  Chronic; all over; primary RN made aware  Chronic, primary RN aware          Orders             Duration: 3 hours  Start:  1210  End:  1510  Total:  3 hours        Dialyzer:  revaclear     K Bath:  2     Ca Bath:  2     Na / Bicarb:  140/40        Target Fluid Removal:  3000 ml          Access        Type & Location:  Right upper arm A/V fistula       Comments:   +thrill, +bruit, prepped per protocol, cannulated with 15 gauge 1 inch needles taped and secured. +aspiration, +flush to both ports. No redness, swelling or drainage noted. At end of tx, all possible blood returned to pt, hemostasis obtained by hand held  pressure x6 minutes. Pressure dressing then applied.                                    Labs        Hep B status / date:  hep B antibodies positive on 02/03/12        Obtained/Reviewed   Critical Results Called     reviewed          Meds Given         Name  Dose  Route         Heparin (bolus)  1000 units  IV  Total Liters Process:  67.6 L        Net Fluid Removed:  3000 ml           Comments        Time Out Done:  Completed at 1200     Primary Nurse Rpt Pre:  Marca Ancona, RN     Primary Nurse Rpt Post:  Marca Ancona, RN        Pt Education:   Educated on access care and tx options        Tx Summary:  After pre dialysis report, consent confirmed, orders reviewed, and time out completed. Right upper arm fistula prepped per protocol. Pt  received a 1000 unit bolus of heparin at the initiation of tx. Pt. Tolerated tx well, no issues noted. Condition of dialyzer post YQ:MVHQ. Post dialysis report given to primary RN, Marca Ancona. Side rails up, bed in low position, call bell in reach.      Alen Blew, RN

## 2012-06-07 NOTE — Progress Notes (Signed)
Hematology-Oncology Progress Note    Tyler Ballard  Aug 16, 1981  [id number]  06/07/2012    Follow-up for: sickle cell anemia     [x]         Chart notes since last visit reviewed   [x]         Medications reviewed for allergies and interactions       Case discussed with the following:         []         Case Manager                    []         Nursing Staff                                                                         []         Pathologist                                                                        []         FAMILY      Subjective:     Spoke with patient who complains of: pt. Seems comfortable at present, informs me that he still has swelling in the legs which is painful, doesn't want to switch to po yet    Objective:     Patient Vitals for the past 24 hrs:   BP Temp Pulse Resp SpO2   06/07/12 0923 - - - - 93 %   06/07/12 0819 182/92 mmHg - 77  - -   06/07/12 0324 154/88 mmHg 98.4 ??F (36.9 ??C) 71  16  98 %   06/06/12 2247 163/89 mmHg - 75  - -   06/06/12 2043 171/93 mmHg 97.7 ??F (36.5 ??C) 76  16  98 %   06/06/12 1950 198/97 mmHg - 87  16  97 %   06/06/12 1845 178/88 mmHg - 75  - -   06/06/12 1815 183/94 mmHg - 73  - -   06/06/12 1745 193/98 mmHg - 81  - -   06/06/12 1715 139/75 mmHg - 68  - -   06/06/12 1645 166/92 mmHg - 66  - -   06/06/12 1615 164/93 mmHg - 69  - -   06/06/12 1545 176/91 mmHg - 73  - -   06/06/12 1515 161/83 mmHg - 69  - -   06/06/12 1445 155/77 mmHg - 71  - -   06/06/12 1147 179/91 mmHg 99.6 ??F (37.6 ??C) 70  15  97 %       REVIEW OF SYSTEMS:    Constitutional: negative fever, negative chills, negative weight loss  Eyes:   negative visual changes  ENT:   negative sore throat, tongue or lip swelling  Respiratory:  negative cough, negative dyspnea  Cards:  negative for chest pain, palpitations, lower extremity edema  GI:   negative for nausea, vomiting, diarrhea, and abdominal pain  Neuro:  negative for headaches, dizziness, vertigo  [x]                         Full ROS o/w  normal/non contributor    Constitutional:  Patient looks  []         Sick  []         Frail  [x]         Better                                                 []         Depressed    HEENT:  [x]    NC                         []    AT               []     ALOPECIA           Eyes: [x]    Normal               []     Icteric  Oropharynx: [x]     Normal                  []   Thrush               []    Dry  Mucositis: [x]     None                 Grade: []         I  []         II  []         III  []         IV  Neck:   [x]    Supple                  []   Rigid               JVD:    [x]    ABSENT       []    PRESENT  Lymphadenopathy:   []    None Noted            []    PRESENT    Chest:  [x]    Clear               []     Rhonchi                      Dec'd @     []   Right Base           []    Left Base    CV:             [x]    Regular              []   Irregular               []    Tachy                []    Murmur  Abdominal:   [x]     Soft              []    NON-tender               []   Tender      BS:    [x]    ABSENT                   []    PRESENT  Liver:     [x]   NON-palp                  []    EDGE- palp  Spleen: [x]    NON-palp                   []   EDGE - palp  Mass:   [x]    ABSENT                          []   PRESENT  Extr:    []   Lymphedema             []    Cyanosis      []   Clubbing  Edema:     []    NONE       []    PRESENT  Skin:  Intact [x]            Purpura []         Rash: [x]    ABSENT       []   PRESENT  Neuro:  [x]         Normal  []         Confused      Available labs reviewed:  Labs:    Recent Results (from the past 24 hour(s))   GLUCOSE, POC    Collection Time    06/06/12 11:20 AM       Component Value Range    POC GLUCOSE 98  75 - 110 mg/dL    Performed by Rod Holler     GLUCOSE, POC    Collection Time    06/06/12 11:46 AM       Component Value Range    POC GLUCOSE 102  75 - 110 mg/dL    Performed by GRUBBS JEAN     CBC WITH AUTOMATED DIFF    Collection Time    06/06/12  2:10 PM       Component Value Range    WBC 2.7 (*) 4.1 - 11.1 K/uL     RBC 2.64 (*) 4.10 - 5.70 M/uL    HGB 7.9 (*) 12.1 - 17.0 g/dL    HCT 09.8 (*) 11.9 - 50.3 %    MCV 94.3  80.0 - 99.0 FL    MCH 29.9  26.0 - 34.0 PG    MCHC 31.7  30.0 - 36.5 g/dL    RDW 14.7 (*) 82.9 - 14.5 %    PLATELET 103 (*) 150 - 400 K/uL    NEUTROPHILS 54  32 - 75 %    LYMPHOCYTES 28  12 - 49 %    MONOCYTES 13  5 - 13 %    EOSINOPHILS 5  0 - 7 %    BASOPHILS 0  0 - 1 %    ABS. NEUTROPHILS 1.4 (*) 1.8 - 8.0 K/UL    ABS. LYMPHOCYTES 0.8  0.8 - 3.5 K/UL    ABS. MONOCYTES 0.4  0.0 - 1.0 K/UL    ABS. EOSINOPHILS 0.1  0.0 - 0.4 K/UL    ABS. BASOPHILS 0.0  0.0 - 0.1 K/UL    DF SMEAR SCANNED      RBC COMMENTS        Value: BASOPHILIC STIPPLING PRESENT  OVALOCYTES PRESENT   GLUCOSE, POC    Collection Time    06/06/12  3:46 PM       Component Value Range    POC GLUCOSE 71 (*) 75 - 110 mg/dL    Performed by Johny Shock     GLUCOSE, POC    Collection Time    06/06/12  4:05 PM       Component Value Range    POC GLUCOSE 90  75 - 110 mg/dL    Performed by Johny Shock     GLUCOSE, POC    Collection Time    06/06/12  8:22 PM       Component Value Range    POC GLUCOSE 74 (*) 75 - 110 mg/dL    Performed by Lorel Monaco     GLUCOSE, POC    Collection Time    06/06/12  8:53 PM       Component Value Range    POC GLUCOSE 95  75 - 110 mg/dL    Performed by Tresa Endo PAULA     METABOLIC PANEL, BASIC    Collection Time    06/07/12  3:15 AM       Component Value Range    Sodium 136  136 - 145 MMOL/L    Potassium 6.1 (*) 3.5 - 5.1 MMOL/L    Chloride 106  97 - 108 MMOL/L    CO2 23  21 - 32 MMOL/L    Anion gap 7  5 - 15 mmol/L    Glucose 76  65 - 100 MG/DL    BUN 38 (*) 6 - 20 MG/DL    Creatinine 1.61 (*) 0.45 - 1.15 MG/DL    BUN/Creatinine ratio 6 (*) 12 - 20      GFR est-AA 13 (*) >60 ml/min/1.64m2    GFR est non-AA 11 (*) >60 ml/min/1.94m2    Calcium 8.3 (*) 8.5 - 10.1 MG/DL   CBC WITH AUTOMATED DIFF    Collection Time    06/07/12  3:15 AM       Component Value Range    WBC 2.9 (*) 4.1 - 11.1 K/uL    RBC 2.60 (*) 4.10 - 5.70 M/uL     HGB 7.9 (*) 12.1 - 17.0 g/dL    HCT 09.6 (*) 04.5 - 50.3 %    MCV 95.8  80.0 - 99.0 FL    MCH 30.4  26.0 - 34.0 PG    MCHC 31.7  30.0 - 36.5 g/dL    RDW 40.9 (*) 81.1 - 14.5 %    PLATELET 97 (*) 150 - 400 K/uL    NEUTROPHILS 57  32 - 75 %    LYMPHOCYTES 25  12 - 49 %    MONOCYTES 11  5 - 13 %    EOSINOPHILS 6  0 - 7 %    BASOPHILS 1  0 - 1 %    ABS. NEUTROPHILS 1.7 (*) 1.8 - 8.0 K/UL    ABS. LYMPHOCYTES 0.7 (*) 0.8 - 3.5 K/UL    ABS. MONOCYTES 0.3  0.0 - 1.0 K/UL    ABS. EOSINOPHILS 0.2  0.0 - 0.4 K/UL    ABS. BASOPHILS 0.0  0.0 - 0.1 K/UL   GLUCOSE, POC    Collection Time    06/07/12  6:39 AM       Component Value Range    POC GLUCOSE 89  75 - 110 mg/dL    Performed by Dow Adolph  Available Xrays reviewed:    Chemotherapy monitored and toxicities assessed:    Assessment and Plan   1. Sickle cell pain crisis... Probably precipitated by pneumonia...Marland Kitchenhe is still requiring IV pain meds, he looks much more  Comfortable ,. Pt. Says his crises usually last 3-4 days, is followed at [facility] in the [facility] clinic  2. Anemia..hgb 7.9  Stable x 2 days, no transfusions ordered today  Secondary to #1 +/_ esrd...  3. Pneumonia... Cultures negative so far, will continue antibiotics,off hydrea , cxr looks better on my evaluation  4. ESRD... For dialysis today/ followed by fistulogram  Harlen Labs, MD

## 2012-06-07 NOTE — Procedures (Signed)
[place] [facility]                         [phone]  Vitals Pre Post Assessment Pre Post   BP 140/77 167/90 LOC A&OX3 A&OX3   HR 75 88 Lungs Clear througout clear   Temp 98.8 98.6 Cardiac regular regular   Resp 18 16 Skin Warm, dry;scaly Warm,dry, and scalyl   Weight 96.7 kg 93.2 kg Edema Lower ext +3;generalized trace   Lower ext. +3      Pain Chronic; all over; primary RN made aware Chronic, primary RN aware     Orders   Duration: 3 hours Start: 1210 End: 1510 Total: 3 hours   Dialyzer: revaclear   K Bath: 2   Ca Bath: 2   Na / Bicarb: 140/40   Target Fluid Removal: 3000 ml     Access   Type & Location: Right upper arm A/V fistula   Comments:  +thrill, +bruit, prepped per protocol, cannulated with 15 gauge 1 inch needles taped and secured. +aspiration, +flush to both ports. No redness, swelling or drainage noted. At end of tx, all possible blood returned to pt, hemostasis obtained by hand held pressure x6 minutes. Pressure dressing then applied.                               Labs   Hep B status / date: hep B antibodies positive on 02/03/12   Obtained/Reviewed  Critical Results Called   reviewed     Meds Given   Name Dose Route   Heparin (bolus) 1000 units IV               Total Liters Process: 67.6 L   Net Fluid Removed: 3000 ml      Comments   Time Out Done: Completed at 1200   Primary Nurse Rpt Pre: Marca Ancona, RN   Primary Nurse Rpt Post: Marca Ancona, RN   Pt Education: Educated on access care and tx options   Tx Summary: After pre dialysis report, consent confirmed, orders reviewed, and time out completed. Right upper arm fistula prepped per protocol. Pt received a 1000 unit bolus of heparin at the initiation of tx. Pt. Tolerated tx well, no issues noted. Condition of dialyzer post AV:WUJW. Post dialysis report given to primary RN, Marca Ancona. Side rails up, bed in low position, call bell in reach.    Alen Blew, RN

## 2012-06-07 NOTE — Other (Signed)
DTC Progress Note    Recommendations/ Comments: Noted decrease in Lantus d/t hypoglycemia. Will continue to  Monitor.    Chart reviewed on [employer].      POC Glucose last 24hrs:   Lab Results   Component Value Date/Time    POC GLUCOSE 97 06/07/2012 12:21 PM    POC GLUCOSE 72 06/07/2012 11:18 AM    POC GLUCOSE 89 06/07/2012  6:39 AM    POC GLUCOSE 95 06/06/2012  8:53 PM    POC GLUCOSE 74 06/06/2012  8:22 PM    POC GLUCOSE 90 06/06/2012  4:05 PM    POC GLUCOSE 71 06/06/2012  3:46 PM    POC GLUCOSE 102 06/06/2012 11:46 AM         Lab Results   Component Value Date/Time    Creatinine 5.97 06/07/2012  3:15 AM           Current hospital DM medications:   Humalog High Sensitivity Correction scale TID and Lantus 2 units daily     Will continue to follow as needed.    Thank you.     Jeani Hawking RN,BSN,CDE  [phone]

## 2012-06-07 NOTE — Progress Notes (Signed)
Bedside shift change report given to Esther (oncoming nurse) by Chelsea (offgoing nurse).  Report given with SBAR, Kardex and MAR.

## 2012-06-07 NOTE — Progress Notes (Signed)
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 Get fistulogram today   Anemia : SC D + ESRD. Check CBC at HD. Has not been getting Epo at HD. Reordered it      HTN :uncontrolled.  SCC: per Hematology    LLL PNA : per ID    For other plans, see orders.    Interval History:  HD yesterday 3 kg removed   Not getting Epo at HD   Bleeding post HD     Current Facility-Administered Medications   Medication Dose Route Frequency   ??? sodium chloride (NS) 0.9 % flush       ??? sodium chloride (NS) 0.9 % flush       ??? sodium chloride (NS) 0.9 % flush       ??? sodium chloride (NS) 0.9 % flush       ??? sodium chloride (NS) 0.9 % flush       ??? hydrALAZINE (APRESOLINE) tablet 50 mg  50 mg Oral TID   ??? heparin (porcine) 1,000 unit/mL injection 3,500 Units  3,500 Units IntraVENous ONCE   ??? lisinopril (PRINIVIL, ZESTRIL) tablet 40 mg  40 mg Oral DAILY   ??? metoprolol (LOPRESSOR) tablet 100 mg  100 mg Oral BID   ??? HYDROmorphone (DILAUDID) injection 3 mg  3 mg IntraVENous Q2H PRN   ??? insulin glargine (LANTUS) injection 5 Units  5 Units SubCUTAneous DAILY   ??? 0.9% sodium chloride infusion  25 mL/hr IntraVENous CONTINUOUS   ??? acetaminophen (TYLENOL) tablet 650 mg  650 mg Oral Q4H PRN   ??? amLODIPine (NORVASC) tablet 10 mg  10 mg Oral DAILY   ??? metoclopramide HCl (REGLAN) injection 10 mg  10 mg IntraVENous Q6H PRN   ??? hydrALAZINE (APRESOLINE) tablet 10 mg  10 mg Oral Q6H PRN   ??? insulin lispro (HUMALOG) injection   SubCUTAneous TIDAC   ??? calcium acetate (PHOSLO) tablet 1,334 mg  2 Tab Oral TID WITH MEALS   ??? sodium chloride (NS) flush 5-10 mL  5-10 mL IntraVENous Q8H   ??? sodium chloride (NS) flush 5-10 mL  5-10 mL IntraVENous PRN   ??? heparin (porcine) injection 5,000 Units  5,000 Units SubCUTAneous Q8H   ??? 0.9% sodium chloride infusion   75 mL/hr IntraVENous CONTINUOUS   ??? B complex-vitaminC-folic acid (NEPHROCAP) cap  1 Cap Oral DAILY   ??? diphenhydrAMINE (BENADRYL) injection 12.5 mg  12.5 mg IntraVENous Q6H PRN   ??? nicotine (NICODERM CQ) 14 mg/24 hr patch 1 Patch  1 Patch TransDERmal Q24H   ??? glucose chewable tablet 16 g  4 Tab Oral PRN   ??? dextrose (D50W) injection Syrg 12.5-25 g  12.5-25 g IntraVENous PRN   ??? glucagon (GLUCAGEN) injection 1 mg  1 mg IntraMUSCular PRN        Review of Systems:  Pertinent items are noted in HPI.    Objective:  Vitals:    Filed Vitals:    06/06/12 2043 06/06/12 2247 06/07/12 0324 06/07/12 0819   BP: 171/93 163/89 154/88 182/92   Pulse: 76 75 71 77   Temp: 97.7 ??F (36.5 ??C)  98.4 ??F (36.9 ??C)    Resp: 16  16    Height:       Weight:       SpO2: 98%  98%      Intake and Output:     06/18 1900 - 06/20 0659  In: 240 [P.O.:240]  Out: 3250 [Urine:250]    Physical Examination:  General: No distress   Neck:  Supple, no mass  Resp:  Lungs CTA B/L, no wheezing , normal respiratory effort  CV:  RRR,  no murmur or rub,trace  LE edema  GI:  Soft, NT, + Bowel sounds, no hepatosplenomegaly  Neurologic:  Non focal  Psych:             AAO x 3 appropriate affect   Skin:  No Rash. Multiple acneform lesions on face       []     High complexity decision making was performed  []     Patient is at high-risk of decompensation with multiple organ involvement    Lab Data Personally Reviewed: I have reviewed all the pertinent labs, microbiology data and radiology studies during assessment.    Recent Labs   [facility] 06/07/12 0315    NA 136    K 6.1*    CL 106    CO2 23    GLU 76    BUN 38*    CREA 5.97*    CA 8.3*    MG --    PHOS --    ALB --    TBIL --    SGOT --    INR --     Recent Labs   Basename 06/07/12 0315 06/06/12 1410 06/04/12 1030    WBC 2.9* 2.7* 3.7*    HGB 7.9* 7.9* 8.4*    HCT 24.9* 24.9* 25.9*    PLT 97* 103* 141*     Lab Results   Component Value Date/Time    Specimen Description: BLOOD 05/24/2012 10:47 AM     Lab Results    Component Value Date/Time    Culture result: NO GROWTH 5 DAYS 05/24/2012 10:47 AM     Recent Results (from the past 24 hour(s))   GLUCOSE, POC    Collection Time    06/06/12 11:20 AM       Component Value Range    POC GLUCOSE 98  75 - 110 mg/dL    Performed by Rod Holler     GLUCOSE, POC    Collection Time    06/06/12 11:46 AM       Component Value Range    POC GLUCOSE 102  75 - 110 mg/dL    Performed by GRUBBS JEAN     CBC WITH AUTOMATED DIFF    Collection Time    06/06/12  2:10 PM       Component Value Range    WBC 2.7 (*) 4.1 - 11.1 K/uL    RBC 2.64 (*) 4.10 - 5.70 M/uL    HGB 7.9 (*) 12.1 - 17.0 g/dL    HCT 29.5 (*) 62.1 - 50.3 %    MCV 94.3  80.0 - 99.0 FL    MCH 29.9  26.0 - 34.0 PG    MCHC 31.7  30.0 - 36.5 g/dL    RDW 30.8 (*) 65.7 - 14.5 %    PLATELET 103 (*) 150 - 400 K/uL    NEUTROPHILS 54  32 - 75 %    LYMPHOCYTES 28  12 - 49 %    MONOCYTES 13  5 - 13 %  EOSINOPHILS 5  0 - 7 %    BASOPHILS 0  0 - 1 %    ABS. NEUTROPHILS 1.4 (*) 1.8 - 8.0 K/UL    ABS. LYMPHOCYTES 0.8  0.8 - 3.5 K/UL    ABS. MONOCYTES 0.4  0.0 - 1.0 K/UL    ABS. EOSINOPHILS 0.1  0.0 - 0.4 K/UL    ABS. BASOPHILS 0.0  0.0 - 0.1 K/UL    DF SMEAR SCANNED      RBC COMMENTS        Value: BASOPHILIC STIPPLING PRESENT      OVALOCYTES PRESENT   GLUCOSE, POC    Collection Time    06/06/12  3:46 PM       Component Value Range    POC GLUCOSE 71 (*) 75 - 110 mg/dL    Performed by Johny Shock     GLUCOSE, POC    Collection Time    06/06/12  4:05 PM       Component Value Range    POC GLUCOSE 90  75 - 110 mg/dL    Performed by Johny Shock     GLUCOSE, POC    Collection Time    06/06/12  8:22 PM       Component Value Range    POC GLUCOSE 74 (*) 75 - 110 mg/dL    Performed by Lorel Monaco     GLUCOSE, POC    Collection Time    06/06/12  8:53 PM       Component Value Range    POC GLUCOSE 95  75 - 110 mg/dL    Performed by Tresa Endo PAULA     METABOLIC PANEL, BASIC    Collection Time    06/07/12  3:15 AM       Component Value Range    Sodium 136  136 - 145  MMOL/L    Potassium 6.1 (*) 3.5 - 5.1 MMOL/L    Chloride 106  97 - 108 MMOL/L    CO2 23  21 - 32 MMOL/L    Anion gap 7  5 - 15 mmol/L    Glucose 76  65 - 100 MG/DL    BUN 38 (*) 6 - 20 MG/DL    Creatinine 1.61 (*) 0.45 - 1.15 MG/DL    BUN/Creatinine ratio 6 (*) 12 - 20      GFR est-AA 13 (*) >60 ml/min/1.81m2    GFR est non-AA 11 (*) >60 ml/min/1.55m2    Calcium 8.3 (*) 8.5 - 10.1 MG/DL   CBC WITH AUTOMATED DIFF    Collection Time    06/07/12  3:15 AM       Component Value Range    WBC 2.9 (*) 4.1 - 11.1 K/uL    RBC 2.60 (*) 4.10 - 5.70 M/uL    HGB 7.9 (*) 12.1 - 17.0 g/dL    HCT 09.6 (*) 04.5 - 50.3 %    MCV 95.8  80.0 - 99.0 FL    MCH 30.4  26.0 - 34.0 PG    MCHC 31.7  30.0 - 36.5 g/dL    RDW 40.9 (*) 81.1 - 14.5 %    PLATELET 97 (*) 150 - 400 K/uL    NEUTROPHILS 57  32 - 75 %    LYMPHOCYTES 25  12 - 49 %    MONOCYTES 11  5 - 13 %    EOSINOPHILS 6  0 - 7 %    BASOPHILS 1  0 - 1 %  ABS. NEUTROPHILS 1.7 (*) 1.8 - 8.0 K/UL    ABS. LYMPHOCYTES 0.7 (*) 0.8 - 3.5 K/UL    ABS. MONOCYTES 0.3  0.0 - 1.0 K/UL    ABS. EOSINOPHILS 0.2  0.0 - 0.4 K/UL    ABS. BASOPHILS 0.0  0.0 - 0.1 K/UL   GLUCOSE, POC    Collection Time    06/07/12  6:39 AM       Component Value Range    POC GLUCOSE 89  75 - 110 mg/dL    Performed by Dow Adolph         Total time spent with patient:  15  min.                               Care Plan discussed with:  Patient  x   Family      RN  x    Consulting Physician /Specialist        I have reviewed the flowsheets.  Chart reviewed. Pertinent Notes reviewed.   Current Medications list reviewed by me.  No change in PMH ,family and social history from Consult note.    Rhae Hammock, MD  06/07/2012    [facility]  [address], [address],   [place], [place] [address]  Phone - [phone]   Fax: [phone]  [email or web address]

## 2012-06-07 NOTE — Consults (Signed)
Name:       Tyler Ballard, Tyler Ballard          Admitted:          05/24/2012                                         DOB:               1981/06/26  Account #:  [id number]               Age:               31  Consultant: Lenn Sink, MD         Location                                CONSULTATION REPORT    DATE OF CONSULTATION           06/07/2012      REASON FOR CONSULTATION: Excessive bleeding during dialysis from right arm  fistula.    HISTORY OF PRESENT ILLNESS: The patient is a pleasant, 31 year old male  with end-stage renal disease and sickle cell anemia, who presented with  what felt like a sickle cell crisis a few weeks ago to the hospital. During  his hospitalization here they sutured some excessive bleeding from his  right arm fistula. We are being consulted for fistulogram.    PAST MEDICAL HISTORY: Significant for diabetes, end-stage renal disease,  sickle cell anemia.    SOCIAL HISTORY: The patient reports that he has been doing better this year  in terms of sickle cell crisis. This is his second this year so far.    MEDICATIONS  Currently INCLUDE:  1. Norvasc.  2. Tylenol.  3. Nephrocaps.  4. PhosLo.  5. Insulin.  6. Hydralazine p.r.n.  7. Dilaudid.  8. Metoprolol.  9. Reglan.  10. Nicoderm patch.    ALLERGIES: HAS NO KNOWN DRUG ALLERGIES.    REVIEW OF SYSTEMS: There are no fevers, chills, chest pain, shortness of  breath. No abdominal pain. No nausea, no vomiting, no diarrhea. Really no  complaints at this time that are relevant.    SOCIAL HISTORY: He does have a smoking history.    PHYSICAL EXAMINATION  VITAL SIGNS: Temperature is 98.4, heart rate 83, blood pressure 188/98.  GENERAL: He is currently off dialysis, in no acute distress. Alert and  oriented.  NECK: There are no carotid bruits.  LUNGS: Clear to auscultation bilaterally.  HEART: Regular rate and rhythm.  ABDOMEN: Soft, nondistended, nontender.  EXTREMITIES: His right lower extremity features what appears to be a  possible basilic vein  transposition fistula that has a nice thrill in it.  He is currently connected to the machine, so it is difficult to see the  entire fistula because the needles and bandages are on.    LABORATORY DATA: Reveal a white blood cell count of 2.9, hemoglobin 7.9,  platelet count of 97. Electrolytes within normal limits other than a  creatinine of 5.97 and BUN is 38.    IMPRESSION AND PLAN: Possible venous hypertension. Question is to rule out  a venous outflow obstruction. Plan is for a fistulogram in the  catheterization lab tomorrow.                Lenn Sink, MD    cc:  Lenn Sink, MD      AM/wmx; D: 06/07/2012 02:49 P; T: 06/07/2012 03:17 P; DOC# [id number]; Job#  [id number]

## 2012-06-07 NOTE — Progress Notes (Signed)
Bedside shift change report given to Jean RN (oncoming nurse) by Esther RN (offgoing nurse).  Report given with SBAR, Kardex, Intake/Output and MAR.

## 2012-06-08 LAB — GLUCOSE, POC
Glucose (POC): 107 mg/dL (ref 75–110)
Glucose (POC): 91 mg/dL (ref 75–110)
Glucose (POC): 87 mg/dL (ref 75–110)
Glucose (POC): 75 mg/dL (ref 75–110)
Glucose (POC): 73 mg/dL — ABNORMAL LOW (ref 75–110)
Glucose (POC): 99 mg/dL (ref 75–110)
Glucose (POC): 106 mg/dL (ref 75–110)

## 2012-06-08 LAB — CBC WITH AUTOMATED DIFF
ABS. BASOPHILS: 0 10*3/uL (ref 0.0–0.1)
ABS. EOSINOPHILS: 0.2 10*3/uL (ref 0.0–0.4)
ABS. LYMPHOCYTES: 0.9 10*3/uL (ref 0.8–3.5)
ABS. MONOCYTES: 0.2 10*3/uL (ref 0.0–1.0)
ABS. NEUTROPHILS: 1.5 10*3/uL — ABNORMAL LOW (ref 1.8–8.0)
BASOPHILS: 1 % (ref 0–1)
EOSINOPHILS: 6 % (ref 0–7)
HCT: 24.9 % — ABNORMAL LOW (ref 36.6–50.3)
HGB: 8.1 g/dL — ABNORMAL LOW (ref 12.1–17.0)
LYMPHOCYTES: 30 % (ref 12–49)
MCH: 30.5 PG (ref 26.0–34.0)
MCHC: 32.5 g/dL (ref 30.0–36.5)
MCV: 93.6 FL (ref 80.0–99.0)
MONOCYTES: 9 % (ref 5–13)
NEUTROPHILS: 54 % (ref 32–75)
PLATELET: 120 10*3/uL — ABNORMAL LOW (ref 150–400)
RBC: 2.66 M/uL — ABNORMAL LOW (ref 4.10–5.70)
RDW: 14.8 % — ABNORMAL HIGH (ref 11.5–14.5)
WBC: 2.8 10*3/uL — ABNORMAL LOW (ref 4.1–11.1)

## 2012-06-08 LAB — METABOLIC PANEL, BASIC
Anion gap: 7 mmol/L (ref 5–15)
BUN/Creatinine ratio: 6 — ABNORMAL LOW (ref 12–20)
BUN: 34 MG/DL — ABNORMAL HIGH (ref 6–20)
CO2: 28 MMOL/L (ref 21–32)
Calcium: 8 MG/DL — ABNORMAL LOW (ref 8.5–10.1)
Chloride: 99 MMOL/L (ref 97–108)
Creatinine: 5.29 MG/DL — ABNORMAL HIGH (ref 0.45–1.15)
GFR est AA: 15 mL/min/{1.73_m2} — ABNORMAL LOW (ref >60)
GFR est non-AA: 13 mL/min/{1.73_m2} — ABNORMAL LOW (ref >60)
Glucose: 80 MG/DL (ref 65–100)
Potassium: 5.8 MMOL/L — ABNORMAL HIGH (ref 3.5–5.1)
Sodium: 134 MMOL/L — ABNORMAL LOW (ref 136–145)

## 2012-06-08 LAB — TYPE + CROSSMATCH
ABO/Rh(D): B POS
ANTIGEN/ANTIBODY INFO: NEGATIVE
ANTIGEN/ANTIBODY INFO: NEGATIVE
Antibody screen: NEGATIVE
Physician instructions: NEGATIVE
Unit division: 0
Unit division: 0

## 2012-06-08 MED ORDER — MIDAZOLAM 1 MG/ML IJ SOLN
1 mg/mL | INTRAMUSCULAR | Status: DC | PRN
Start: 2012-06-08 — End: 2012-06-11
  Administered 2012-06-08 (×2): via INTRAVENOUS

## 2012-06-08 MED ORDER — SALINE PERIPHERAL FLUSH PRN
INTRAMUSCULAR | Status: DC | PRN
Start: 2012-06-08 — End: 2012-06-12

## 2012-06-08 MED ORDER — LIDOCAINE HCL 1 % (10 MG/ML) IJ SOLN
10 mg/mL (1 %) | Freq: Once | INTRAMUSCULAR | Status: DC | PRN
Start: 2012-06-08 — End: 2012-06-11
  Administered 2012-06-08: 16:00:00 via SUBCUTANEOUS

## 2012-06-08 MED ORDER — HEPARIN (PORCINE) IN NS (PF) 1,000 UNIT/500 ML IV
1000 unit/500 mL | INTRAVENOUS | Status: DC | PRN
Start: 2012-06-08 — End: 2012-06-11
  Administered 2012-06-08: 16:00:00

## 2012-06-08 MED ORDER — HEPARIN (PORCINE) 1,000 UNIT/ML IJ SOLN
1000 unit/mL | Freq: Once | INTRAMUSCULAR | Status: DC | PRN
Start: 2012-06-08 — End: 2012-06-11
  Administered 2012-06-08: 16:00:00 via INTRAVENOUS

## 2012-06-08 MED ORDER — IOVERSOL 350 MG/ML IV SOLN
350 mg iodine/mL | Freq: Once | INTRAVENOUS | Status: DC | PRN
Start: 2012-06-08 — End: 2012-06-11

## 2012-06-08 MED ORDER — IODIXANOL 320 MG/ML IV SOLN
320 mg iodine/mL | Freq: Once | INTRAVENOUS | Status: DC | PRN
Start: 2012-06-08 — End: 2012-06-11

## 2012-06-08 MED ORDER — ATROPINE 0.1 MG/ML SYRINGE
0.1 mg/mL | INTRAMUSCULAR | Status: DC | PRN
Start: 2012-06-08 — End: 2012-06-12

## 2012-06-08 MED ORDER — EPINEPHRINE 0.1 MG/ML SYRINGE
0.1 mg/mL | Freq: Once | INTRAMUSCULAR | Status: DC | PRN
Start: 2012-06-08 — End: 2012-06-12

## 2012-06-08 MED ORDER — FENTANYL CITRATE (PF) 50 MCG/ML IJ SOLN
50 mcg/mL | INTRAMUSCULAR | Status: DC | PRN
Start: 2012-06-08 — End: 2012-06-12
  Administered 2012-06-08: 16:00:00 via INTRAVENOUS

## 2012-06-08 MED ADMIN — heparin (porcine) injection 5,000 Units: SUBCUTANEOUS | @ 01:00:00 | NDC 25021040201

## 2012-06-08 MED ADMIN — HYDROmorphone (DILAUDID) injection 3 mg: INTRAVENOUS | @ 21:00:00 | NDC 00641012121

## 2012-06-08 MED ADMIN — sodium polystyrene (KAYEXALATE) 15 gram/60 mL oral suspension 15 g: ORAL | @ 13:00:00 | NDC 46287000660

## 2012-06-08 MED ADMIN — diphenhydrAMINE (BENADRYL) injection 12.5 mg: INTRAVENOUS | @ 07:00:00 | NDC 00641037621

## 2012-06-08 MED ADMIN — hydrALAZINE (APRESOLINE) tablet 10 mg: ORAL | @ 13:00:00 | NDC 68084044711

## 2012-06-08 MED ADMIN — sodium chloride (NS) flush 5-10 mL: INTRAVENOUS | @ 01:00:00 | NDC 87701099893

## 2012-06-08 MED ADMIN — amLODIPine (NORVASC) tablet 10 mg: ORAL | @ 13:00:00 | NDC 59762153006

## 2012-06-08 MED ADMIN — HYDROmorphone (DILAUDID) injection 3 mg: INTRAVENOUS | @ 07:00:00 | NDC 00641012121

## 2012-06-08 MED ADMIN — diphenhydrAMINE (BENADRYL) injection 12.5 mg: INTRAVENOUS | @ 01:00:00 | NDC 00641037621

## 2012-06-08 MED ADMIN — HYDROmorphone (DILAUDID) injection 3 mg: INTRAVENOUS | @ 05:00:00 | NDC 00641012121

## 2012-06-08 MED ADMIN — calcium acetate (PHOSLO) tablet 1,334 mg: ORAL | @ 13:00:00 | NDC 00574011302

## 2012-06-08 MED ADMIN — metoprolol (LOPRESSOR) tablet 100 mg: ORAL | @ 13:00:00 | NDC 62584026611

## 2012-06-08 MED ADMIN — heparin (porcine) injection 5,000 Units: SUBCUTANEOUS | @ 18:00:00 | NDC 25021040201

## 2012-06-08 MED ADMIN — heparin (porcine) injection 5,000 Units: SUBCUTANEOUS | @ 11:00:00 | NDC 25021040201

## 2012-06-08 MED ADMIN — hydrALAZINE (APRESOLINE) tablet 100 mg: ORAL | @ 03:00:00 | NDC 62584073411

## 2012-06-08 MED ADMIN — 0.9% sodium chloride infusion 500 mL: INTRAVENOUS | @ 16:00:00 | NDC 00409798303

## 2012-06-08 MED ADMIN — HYDROmorphone (DILAUDID) injection 3 mg: INTRAVENOUS | @ 23:00:00 | NDC 00641012121

## 2012-06-08 MED ADMIN — sodium chloride (NS) flush 5-10 mL: INTRAVENOUS | @ 11:00:00 | NDC 87701099893

## 2012-06-08 MED ADMIN — diphenhydrAMINE (BENADRYL) injection 12.5 mg: INTRAVENOUS | @ 17:00:00 | NDC 63323066401

## 2012-06-08 MED ADMIN — lisinopril (PRINIVIL, ZESTRIL) tablet 40 mg: ORAL | @ 13:00:00 | NDC 68084006211

## 2012-06-08 MED ADMIN — 0.9% sodium chloride infusion 500 mL: INTRAVENOUS | @ 16:00:00 | NDC 87701099893

## 2012-06-08 MED ADMIN — hydrALAZINE (APRESOLINE) tablet 100 mg: ORAL | @ 13:00:00 | NDC 77771018410

## 2012-06-08 MED ADMIN — sodium chloride (NS) flush 5-10 mL: INTRAVENOUS | @ 05:00:00 | NDC 87701099893

## 2012-06-08 MED ADMIN — HYDROmorphone (DILAUDID) injection 3 mg: INTRAVENOUS | @ 19:00:00 | NDC 00641012121

## 2012-06-08 MED ADMIN — HYDROmorphone (DILAUDID) injection 3 mg: INTRAVENOUS | @ 09:00:00 | NDC 00641012121

## 2012-06-08 MED ADMIN — sodium chloride (NS) flush 5-10 mL: INTRAVENOUS | @ 07:00:00 | NDC 87701099893

## 2012-06-08 MED ADMIN — HYDROmorphone (DILAUDID) injection 3 mg: INTRAVENOUS | @ 13:00:00 | NDC 00641012121

## 2012-06-08 MED ADMIN — sodium chloride (NS) flush 5-10 mL: INTRAVENOUS | @ 18:00:00 | NDC 87701099893

## 2012-06-08 MED ADMIN — diphenhydrAMINE (BENADRYL) injection 12.5 mg: INTRAVENOUS | @ 23:00:00 | NDC 00641037621

## 2012-06-08 MED ADMIN — insulin regular (NOVOLIN, HUMULIN) injection 10 Units: INTRAVENOUS | @ 12:00:00 | NDC 00002821201

## 2012-06-08 MED ADMIN — insulin glargine (LANTUS) injection 2 Units: SUBCUTANEOUS | @ 13:00:00 | NDC 09999222001

## 2012-06-08 MED ADMIN — HYDROmorphone (DILAUDID) injection 3 mg: INTRAVENOUS | @ 01:00:00 | NDC 00641012121

## 2012-06-08 MED ADMIN — hydrALAZINE (APRESOLINE) tablet 100 mg: ORAL | @ 21:00:00 | NDC 62584073411

## 2012-06-08 MED ADMIN — calcium acetate (PHOSLO) tablet 1,334 mg: ORAL | @ 21:00:00 | NDC 00574011302

## 2012-06-08 MED ADMIN — HYDROmorphone (DILAUDID) injection 3 mg: INTRAVENOUS | @ 03:00:00 | NDC 00641012121

## 2012-06-08 MED ADMIN — metoprolol (LOPRESSOR) tablet 100 mg: ORAL | @ 01:00:00 | NDC 62584026611

## 2012-06-08 MED ADMIN — epoetin alfa (EPOGEN;PROCRIT) injection 20,000 Units: SUBCUTANEOUS | @ 21:00:00 | NDC 59676032000

## 2012-06-08 MED ADMIN — HYDROmorphone (DILAUDID) injection 3 mg: INTRAVENOUS | @ 11:00:00 | NDC 00641012121

## 2012-06-08 MED ADMIN — dextrose (D50W) injection Syrg 25 g: INTRAVENOUS | @ 12:00:00 | NDC 78742035191

## 2012-06-08 MED ADMIN — HYDROmorphone (DILAUDID) injection 3 mg: INTRAVENOUS | @ 17:00:00 | NDC 00641012121

## 2012-06-08 MED ADMIN — calcium acetate (PHOSLO) tablet 1,334 mg: ORAL | @ 18:00:00 | NDC 00574011302

## 2012-06-08 MED FILL — SODIUM CHLORIDE 0.9 % INJECTION: INTRAMUSCULAR | Qty: 10

## 2012-06-08 MED FILL — HYDRALAZINE 50 MG TAB: 50 mg | ORAL | Qty: 2

## 2012-06-08 MED FILL — CALCIUM ACETATE 667 MG TAB: 667 mg | ORAL | Qty: 2

## 2012-06-08 MED FILL — ATROPINE 0.1 MG/ML SYRINGE: 0.1 mg/mL | INTRAMUSCULAR | Qty: 10

## 2012-06-08 MED FILL — DIPHENHYDRAMINE HCL 50 MG/ML IJ SOLN: 50 mg/mL | INTRAMUSCULAR | Qty: 1

## 2012-06-08 MED FILL — NICOTINE 14 MG/24 HR DAILY PATCH: 14 mg/24 hr | TRANSDERMAL | Qty: 1

## 2012-06-08 MED FILL — HYDROMORPHONE 2 MG/ML INJECTION SOLUTION: 2 mg/mL | INTRAMUSCULAR | Qty: 2

## 2012-06-08 MED FILL — HYDRALAZINE 10 MG TAB: 10 mg | ORAL | Qty: 1

## 2012-06-08 MED FILL — BD POSIFLUSH NORMAL SALINE 0.9 % INJECTION SYRINGE: INTRAMUSCULAR | Qty: 10

## 2012-06-08 MED FILL — SODIUM CHLORIDE 0.9 % IV: INTRAVENOUS | Qty: 500

## 2012-06-08 MED FILL — OPTIRAY 350 MG IODINE/ML INTRAVENOUS SOLUTION: 350 mg iodine/mL | INTRAVENOUS | Qty: 200

## 2012-06-08 MED FILL — METOPROLOL TARTRATE 50 MG TAB: 50 mg | ORAL | Qty: 2

## 2012-06-08 MED FILL — HEPARIN (PORCINE) 5,000 UNIT/ML IJ SOLN: 5000 unit/mL | INTRAMUSCULAR | Qty: 1

## 2012-06-08 MED FILL — SPS (WITH SORBITOL) 15 GRAM-20 GRAM/60 ML ORAL SUSPENSION: 15-20 gram/60 mL | ORAL | Qty: 60

## 2012-06-08 MED FILL — BD POSIFLUSH NORMAL SALINE 0.9 % INJECTION SYRINGE: INTRAMUSCULAR | Qty: 50

## 2012-06-08 MED FILL — BD POSIFLUSH NORMAL SALINE 0.9 % INJECTION SYRINGE: INTRAMUSCULAR | Qty: 40

## 2012-06-08 MED FILL — EPINEPHRINE 0.1 MG/ML SYRINGE: 0.1 mg/mL | INTRAMUSCULAR | Qty: 10

## 2012-06-08 MED FILL — NEPHROCAPS 1 MG CAPSULE: 1 mg | ORAL | Qty: 1

## 2012-06-08 MED FILL — MIDAZOLAM 1 MG/ML IJ SOLN: 1 mg/mL | INTRAMUSCULAR | Qty: 5

## 2012-06-08 MED FILL — IOVERSOL 350 MG/ML IV SOLN: 350 mg iodine/mL | INTRAVENOUS | Qty: 200

## 2012-06-08 MED FILL — EPOETIN ALFA 20,000 UNIT/ML IJ SOLN: 20000 unit/mL | INTRAMUSCULAR | Qty: 1

## 2012-06-08 MED FILL — BD POSIFLUSH NORMAL SALINE 0.9 % INJECTION SYRINGE: INTRAMUSCULAR | Qty: 30

## 2012-06-08 MED FILL — HEPARIN (PORCINE) IN NS (PF) 1,000 UNIT/500 ML IV: 1000 unit/500 mL | INTRAVENOUS | Qty: 500

## 2012-06-08 MED FILL — DEXTROSE 50% IN WATER (D50W) IV SYRG: INTRAVENOUS | Qty: 50

## 2012-06-08 MED FILL — HEPARIN (PORCINE) 1,000 UNIT/ML IJ SOLN: 1000 unit/mL | INTRAMUSCULAR | Qty: 10

## 2012-06-08 MED FILL — INSULIN GLARGINE 100 UNIT/ML INJECTION: 100 unit/mL | SUBCUTANEOUS | Qty: 0.02

## 2012-06-08 MED FILL — AMLODIPINE 5 MG TAB: 5 mg | ORAL | Qty: 2

## 2012-06-08 MED FILL — BD POSIFLUSH NORMAL SALINE 0.9 % INJECTION SYRINGE: INTRAMUSCULAR | Qty: 20

## 2012-06-08 MED FILL — LISINOPRIL 20 MG TAB: 20 mg | ORAL | Qty: 2

## 2012-06-08 MED FILL — HYDRALAZINE 10 MG TAB: 10 mg | ORAL | Qty: 9

## 2012-06-08 MED FILL — VISIPAQUE 320 MG IODINE/ML INTRAVENOUS SOLUTION: 320 mg iodine/mL | INTRAVENOUS | Qty: 300

## 2012-06-08 MED FILL — LIDOCAINE HCL 1 % (10 MG/ML) IJ SOLN: 10 mg/mL (1 %) | INTRAMUSCULAR | Qty: 20

## 2012-06-08 MED FILL — FENTANYL CITRATE (PF) 50 MCG/ML IJ SOLN: 50 mcg/mL | INTRAMUSCULAR | Qty: 2

## 2012-06-08 NOTE — Procedures (Signed)
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

## 2012-06-08 NOTE — Progress Notes (Signed)
Hospitalist Progress Note          Glori Luis, MD  PAGER [phone]  Call physician on-call through the operator 7pm-7am    Daily Progress Note: 06/08/2012    Assessment/Plan:     1. LLB pneumonia, poa- On zosyn and Levaquin. Zosyn stopped per ID, Follow Cx , no growth thus far,completed levaquin course  2. Sickle cell crisis, poa - encourge po hydration, 02 as needed . Continue Hydrea.To follow closely and adjust as appropriate, added zofran for nausea, pt off pca ,not helping , started on iv prn , increased to q 2 hrs   3. ESRD on HD - HD as per nephrology . Agree wit fistulogram for suspected clot  Vs venous hypertension, 6/21/  4. Hyperkalemia; secondary to hemolysis,/esrd, agree with kayexalate & dialysis pot angiogram.   5. Anemia - Hb 7.5-7.9 , transfuse 1 unit 6/12 for hb on 7.2 with  hematology f/up , cont to monitor, 1 unit on 6/13,and repeat .awiting transfusion today .  HTN- BP consistently higher, , pain contributing ? ,increased norvasc to 10 , still not better controlled Agree with nephrology increase lisinopril., increased metoprolol to 25 mg during this crisis , pain control and reassess   6. DM- lantus, ssi , had hypoglycemia 6/12. Resolved.  7. Tobaco abuse - nicotine patch, counseled   8. Chronic pain - pain management as per hematology , stable  9. Thrombocytopenia: watch closely.suspect clot induced consumption vs dialysis related.  10. DVT prophylaxis- heparin  11. Plan- Home once fistulogram done.  12. Case d/w patient , addressed pain & other issues to full satisfaction.   13. Disposition : if no vascular intervention pt can be discharged in am.06/09/12               Subjective:   The patient denies any significant sickle cell pain, noted excess bleeding from av-fistula site, ? Venous hypertension.    Review of Systems:   Pertinent items are noted in HPI.    Objective:   Physical Exam:     BP 160/92   Pulse 70   Temp 98.2 ??F (36.8 ??C)   Resp 16   Ht 5\' 5"   (1.651 m)   Wt 203 lb 4.2 oz   BMI 33.82 kg/m2   SpO2 97% O2 Flow Rate (L/min): 2 l/min O2 Device: Room air    Temp (24hrs), Avg:98.8 ??F (37.1 ??C), Min:98.1 ??F (36.7 ??C), Max:99.5 ??F (37.5 ??C)        06/19 1900 - 06/21 0659  In: 120 [P.O.:120]  Out: 3000     General:  Alert, cooperative, no distress, appears stated age.   Lungs:   Clear to auscultation bilaterally.   Chest wall:  No tenderness or deformity.   Heart:  Regular rate and rhythm, S1, S2 normal, no murmur, click, rub or gallop.   Abdomen:   Soft, non-tender. Bowel sounds normal. No masses,  No organomegaly.   Extremities: Extremities normal, atraumatic, no cyanosis or edema.   Pulses: 2+ and symmetric all extremities.   Skin: Skin color, texture, turgor normal. No rashes or lesions   Neurologic: CNII-XII intact.      Data Review:       Recent Days:  Recent Labs   [facility] 06/08/12 0349 06/07/12 0315 06/06/12 1410    WBC 2.8* 2.9* 2.7*    HGB 8.1* 7.9* 7.9*    HCT 24.9* 24.9* 24.9*    PLT 120* 97* 103*     Recent Labs  Basename 06/08/12 0349 06/07/12 0315    NA 134* 136    K 5.8* 6.1*    CL 99 106    CO2 28 23    GLU 80 76    BUN 34* 38*    CREA 5.29* 5.97*    CA 8.0* 8.3*    MG -- --    PHOS -- --    ALB -- --    TBIL -- --    SGOT -- --    INR -- --     No results found for this basename: PH:3,PCO2:3,PO2:3,HCO3:3,FIO2:3 in the last 72 hours    24 Hour Results:  Recent Results (from the past 24 hour(s))   GLUCOSE, POC    Collection Time    06/07/12 11:18 AM       Component Value Range    POC GLUCOSE 72 (*) 75 - 110 mg/dL    Performed by Huey Romans     GLUCOSE, POC    Collection Time    06/07/12 12:21 PM       Component Value Range    POC GLUCOSE 97  75 - 110 mg/dL    Performed by GRUBBS JEAN     GLUCOSE, POC    Collection Time    06/07/12  4:02 PM       Component Value Range    POC GLUCOSE 103  75 - 110 mg/dL    Performed by Huey Romans     GLUCOSE, POC    Collection Time    06/07/12  9:23 PM       Component Value Range    POC GLUCOSE 107  75 - 110  mg/dL    Performed by Dow Adolph     CBC WITH AUTOMATED DIFF    Collection Time    06/08/12  3:49 AM       Component Value Range    WBC 2.8 (*) 4.1 - 11.1 K/uL    RBC 2.66 (*) 4.10 - 5.70 M/uL    HGB 8.1 (*) 12.1 - 17.0 g/dL    HCT 16.1 (*) 09.6 - 50.3 %    MCV 93.6  80.0 - 99.0 FL    MCH 30.5  26.0 - 34.0 PG    MCHC 32.5  30.0 - 36.5 g/dL    RDW 04.5 (*) 40.9 - 14.5 %    PLATELET 120 (*) 150 - 400 K/uL    NEUTROPHILS 54  32 - 75 %    LYMPHOCYTES 30  12 - 49 %    MONOCYTES 9  5 - 13 %    EOSINOPHILS 6  0 - 7 %    BASOPHILS 1  0 - 1 %    ABS. NEUTROPHILS 1.5 (*) 1.8 - 8.0 K/UL    ABS. LYMPHOCYTES 0.9  0.8 - 3.5 K/UL    ABS. MONOCYTES 0.2  0.0 - 1.0 K/UL    ABS. EOSINOPHILS 0.2  0.0 - 0.4 K/UL    ABS. BASOPHILS 0.0  0.0 - 0.1 K/UL   METABOLIC PANEL, BASIC    Collection Time    06/08/12  3:49 AM       Component Value Range    Sodium 134 (*) 136 - 145 MMOL/L    Potassium 5.8 (*) 3.5 - 5.1 MMOL/L    Chloride 99  97 - 108 MMOL/L    CO2 28  21 - 32 MMOL/L    Anion gap 7  5 - 15 mmol/L  Glucose 80  65 - 100 MG/DL    BUN 34 (*) 6 - 20 MG/DL    Creatinine 7.82 (*) 0.45 - 1.15 MG/DL    BUN/Creatinine ratio 6 (*) 12 - 20      GFR est-AA 15 (*) >60 ml/min/1.66m2    GFR est non-AA 13 (*) >60 ml/min/1.72m2    Calcium 8.0 (*) 8.5 - 10.1 MG/DL   GLUCOSE, POC    Collection Time    17-Jun-2012  6:20 AM       Component Value Range    POC GLUCOSE 91  75 - 110 mg/dL    Performed by Rod Holler     GLUCOSE, POC    Collection Time    06-17-2012  7:45 AM       Component Value Range    POC GLUCOSE 87  75 - 110 mg/dL    Performed by Rod Holler         Problem List:  Problem List as of Jun 17, 2012  Never Reviewed      Codes Class Noted - Resolved    *Sickle cell crisis 282.62  05/24/2012 - Present              Medications reviewed  Current Facility-Administered Medications   Medication Dose Route Frequency   ??? insulin regular (NOVOLIN, HUMULIN) injection 10 Units  10 Units IntraVENous ONCE   ??? dextrose (D50W) injection Syrg 25 g  25 g  IntraVENous ONCE   ??? sodium polystyrene (KAYEXALATE) 15 gram/60 mL oral suspension 15 g  15 g Oral NOW   ??? epoetin alfa (EPOGEN;PROCRIT) injection 20,000 Units  20,000 Units SubCUTAneous Q MON, WED & FRI   ??? hydrALAZINE (APRESOLINE) tablet 100 mg  100 mg Oral TID   ??? heparin (porcine) 1,000 unit/mL injection 1,000 Units  1,000 Units Hemodialysis ONCE   ??? insulin glargine (LANTUS) injection 2 Units  2 Units SubCUTAneous DAILY   ??? lisinopril (PRINIVIL, ZESTRIL) tablet 40 mg  40 mg Oral DAILY   ??? metoprolol (LOPRESSOR) tablet 100 mg  100 mg Oral BID   ??? HYDROmorphone (DILAUDID) injection 3 mg  3 mg IntraVENous Q2H PRN   ??? 0.9% sodium chloride infusion  25 mL/hr IntraVENous CONTINUOUS   ??? acetaminophen (TYLENOL) tablet 650 mg  650 mg Oral Q4H PRN   ??? amLODIPine (NORVASC) tablet 10 mg  10 mg Oral DAILY   ??? metoclopramide HCl (REGLAN) injection 10 mg  10 mg IntraVENous Q6H PRN   ??? hydrALAZINE (APRESOLINE) tablet 10 mg  10 mg Oral Q6H PRN   ??? insulin lispro (HUMALOG) injection   SubCUTAneous TIDAC   ??? calcium acetate (PHOSLO) tablet 1,334 mg  2 Tab Oral TID WITH MEALS   ??? sodium chloride (NS) flush 5-10 mL  5-10 mL IntraVENous Q8H   ??? sodium chloride (NS) flush 5-10 mL  5-10 mL IntraVENous PRN   ??? heparin (porcine) injection 5,000 Units  5,000 Units SubCUTAneous Q8H   ??? 0.9% sodium chloride infusion  75 mL/hr IntraVENous CONTINUOUS   ??? B complex-vitaminC-folic acid (NEPHROCAP) cap  1 Cap Oral DAILY   ??? diphenhydrAMINE (BENADRYL) injection 12.5 mg  12.5 mg IntraVENous Q6H PRN   ??? nicotine (NICODERM CQ) 14 mg/24 hr patch 1 Patch  1 Patch TransDERmal Q24H   ??? glucose chewable tablet 16 g  4 Tab Oral PRN   ??? dextrose (D50W) injection Syrg 12.5-25 g  12.5-25 g IntraVENous PRN   ??? glucagon (GLUCAGEN) injection 1 mg  1 mg  IntraMUSCular PRN       Care Plan discussed with: Patient/Family and Nurse    Total time spent with patient: 30 minutes.    Arnetha Massy, MD

## 2012-06-08 NOTE — Progress Notes (Signed)
Bedside shift change report given to Samantha RN (oncoming nurse) by Esther RN (offgoing nurse).  Report given with SBAR, Kardex, Intake/Output and MAR.

## 2012-06-08 NOTE — Op Note (Signed)
Name:      Tyler Ballard, Tyler Ballard                                          Surgeon:        Nani Ingram, MD  Account #: [id number]                 Surgery Date:   06/08/2012  DOB:       10/04/1981  Age:       30                           Location:                                 OPERATIVE REPORT      PREOPERATIVE DIAGNOSIS: Excessive bleeding from arteriovenous fistula.    POSTOPERATIVE DIAGNOSIS: Excessive bleeding from arteriovenous fistula.    PROCEDURES PERFORMED  1. Fistulogram of right arteriovenous fistula.  2. Balloon angioplasty of right innominate vein/venous outflow.    SURGEON: Hedwig Mcfall, MD.    ANESTHESIA: Local sedation.    DRAINS: None.    COMPLICATIONS: None.    SPECIMENS REMOVED: None.    ESTIMATED BLOOD LOSS: None.    INDICATIONS FOR PROCEDURE: The patient is a 30-year-old end-stage renal  disease patient who gets dialyzed through a right arm basilic vein  transposition fistula, who just had signs of venous hypertension with  excessive bleeding and presents for fistulogram.    DESCRIPTION OF PROCEDURE: The patient was placed supine on the angiography  table, and the right arm was prepped and draped in standard surgical  fashion. The fistula was accessed antegrade near the anastomosis and with  an 18-gauge needle, and this was exchanged for a 6-French sheath. A  fistulogram was performed, revealing only high-grade stenosis of the right  innominate vein. The rest of the fistula was patent and without stenosis. A  Glidewire was passed through this, and an 8-mm x 60-mm angioplasty balloon  was used to angioplasty this with some improvement. This was then  angioplastied again with a 10 x 40 mm Conquest angioplasty balloon. This is  right near the first rib/clavicular junction, and this remained fairly  tight but was ballooned up to 10 mm, which should result in some  improvement. After this, the wires and catheters were removed and figure of  8 sutures placed to close the sheath site over a bolster.  Dry sterile  dressing was applied. The patient was awakened and transported to recovery  room in stable condition.        Reviewed on 06/08/2012 8:34 PM          Micha Dosanjh, MD    cc:   Chakravarthy S Bokkisam, MD        Yocelin Vanlue, MD        AM/wmx; D: 06/08/2012 12:43 P; T: 06/08/2012 06:27 P; Doc# [id number]; Job#  [id number]

## 2012-06-08 NOTE — Progress Notes (Signed)
Patient tolerated procedure well, stable, no c/o of pain or discomfort. Transferred to recovery room.

## 2012-06-08 NOTE — Progress Notes (Signed)
Glucose checked = 1314 and patient given apple juice to drink.

## 2012-06-08 NOTE — Progress Notes (Signed)
Cardiac Cath Lab Recovery Arrival Note:      Tyler Ballard arrived to Cardiac Cath Lab, Recovery Area. Staff introduced to patient. Patient identifiers verified with NAME and DATE OF BIRTH. Procedure verified with patient. Consent forms reviewed and signed by patient or authorized representative and verified. Allergies verified.     Patient and family oriented to department. Patient and family informed of procedure and plan of care.     Questions answered with review. Patient prepped for procedure, per orders from physician, prior to arrival.    Patient on cardiac monitor, non-invasive blood pressure, SPO2 monitor. On room air. Patient is A&Ox 3. Patient reports no complaints.     Patient in stretcher, in low position, with side rails up, call bell within reach, patient instructed to call if assistance as needed.    Patient prep in: [facility] Recovery Area, [place] [address].   Patient family has pager # n/a  Family in: n/a.   Prep by: Jola Baptist, RN

## 2012-06-08 NOTE — Procedures (Signed)
[facility]                         [phone]  Vitals Pre Post Assessment Pre Post   BP 154/83 167/90 LOC AXOX3 same   HR 76 73 Lungs clear same   Temp 97.7 97.6 Cardiac wnl same   Resp 16 16 Skin Warm,dry same   Weight 93.7 kg 90.2 kg Edema Generalized,LL +3 pitting same      Pain       Orders   Duration: Start: 1820 End: 2220 Total: 4 hrs   Dialyzer: revaclear   K Bath: 2   Ca Bath: 2   Na / Bicarb: 140/35   Target Fluid Removal: 2 kg as tolerated     Access   Type & Location: Rt UAF   Comments:                    Pre tx, positive thrill and bruit; 2 15 g needles used without complications; Post tx held pressure until bleeding stopped. Pressure dressing and tape applied.            Labs   Hep B status / date: Neg 05/25/12   Obtained/Reviewed  Critical Results Called   reviewed     Meds Given   Name Dose Route   none                 Total Liters Process: 85.0   Net Fluid Removed: 4.5 kg      Comments   Time Out Done: 1730   Primary Nurse Rpt Pre: Noah Charon   Primary Nurse Rpt Post: Fernande Boyden   Pt Education: none   Tx Summary: Pt remained hypertensive through out the tx. Left pt in bed in no distress. Report to [facility]

## 2012-06-08 NOTE — Progress Notes (Signed)
Cardiac Cath Lab Procedure Area Arrival Note:    Tyler Ballard arrived to Cardiac Cath Lab, Procedure Area. Patient identifiers verified with NAME and DATE OF BIRTH. Procedure verified with patient. Consent forms verified. Allergies verified. Patient informed of procedure and plan of care. Questions answered with review. Patient voiced understanding of procedure and plan of care.    Patient on cardiac monitor, non-invasive blood pressure, SPO2 monitor. On room air and placed on O2 @ 2 lpm via nc.  IV of ns on pump at 25 ml/hr. Patient status doing well without problems. Patient is A&Ox 3. Patient reports no pain.    Patient medicated during procedure with orders obtained and verified by Dr. Lawerance Cruel.    Refer to patients Cardiac Cath Lab PROCEDURE REPORT for vital signs, assessment, status, and response during procedure, printed at end of case. Printed report on chart or scanned into chart.

## 2012-06-08 NOTE — Progress Notes (Signed)
TRANSFER - IN REPORT:    Verbal report received from Will on [employer]  being received from procedure for routine progression of care. Report consisted of patient???s Situation, Background, Assessment and Recommendations(SBAR). Information from the following report(s) Procedure Summary, MAR and Recent Results was reviewed with the receiving clinician. Opportunity for questions and clarification was provided. Assessment completed upon patient???s arrival to Cardiac Cath Lab RECOVERY AREA and care assumed.       Cardiac Cath Lab Recovery Arrival Note:    Tyler Ballard arrived to CCL recovery area.  Patient procedure= fistulogram. Patient on cardiac monitor, non-invasive blood pressure, SPO2 monitor. On room air.  IV  Saline locked. Patient status doing well without problems. Patient is A&Ox 3. Patient reports no complaints.    PROCEDURE SITE CHECK:    Procedure site:without any bleeding, no pain/discomfort reported at procedure site.     No change in patient status. Continue to monitor patient and status.

## 2012-06-08 NOTE — Progress Notes (Signed)
Pts blood sugar was 73. Pt drank apple juice and blood sugar came up to 99 on recheck.

## 2012-06-08 NOTE — Procedures (Signed)
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

## 2012-06-08 NOTE — Procedures (Signed)
Procedures  by Leontine Locket, RN at 06/08/12 1935                Author: Leontine Locket, RN  Service: --  Author Type: Registered Nurse       Filed: 06/08/12 2257  Date of Service: 06/08/12 1935  Status: Signed          Editor: Leontine Locket, RN (Registered Nurse)                                   [facility]                         [phone]          Vitals  Pre  Post  Assessment  Pre  Post            BP  154/83  167/90  LOC  AXOX3  same            HR  76  73  Lungs  clear  same            Temp  97.7  97.6  Cardiac  wnl  same     Resp  16  16  Skin  Warm,dry  same     Weight  93.7 kg  90.2 kg  Edema  Generalized,LL +3 pitting  same                  Pain              Orders             Duration:  Start:  1820  End:  2220  Total:  4 hrs        Dialyzer:  revaclear     K Bath:  2     Ca Bath:  2     Na / Bicarb:  140/35        Target Fluid Removal:  2 kg as tolerated          Access        Type & Location:  Rt UAF       Comments:                     Pre tx, positive thrill and bruit; 2 15 g needles used without complications; Post tx held pressure until bleeding stopped. Pressure  dressing and tape applied.                 Labs        Hep B status / date:  Neg 05/25/12        Obtained/Reviewed   Critical Results Called     reviewed          Meds Given         Name  Dose  Route         none                                         Total Liters Process:  85.0        Net Fluid Removed:  4.5 kg           Comments        Time Out Done:  1730  Primary Nurse Rpt Pre:  Noah Charon     Primary Nurse Rpt Post:  Fernande Boyden        Pt Education:  none        Tx Summary:  Pt remained hypertensive through out the tx. Left pt in bed in no distress. Report to [facility]

## 2012-06-08 NOTE — Op Note (Signed)
Name:      Tyler Ballard, Tyler Ballard                                          Surgeon:        Lenn Sink, MD  Account #: [id number]                 Surgery Date:   06/08/2012  DOB:       1981/02/14  Age:       31                           Location:                                 OPERATIVE REPORT      PREOPERATIVE DIAGNOSIS: Excessive bleeding from arteriovenous fistula.    POSTOPERATIVE DIAGNOSIS: Excessive bleeding from arteriovenous fistula.    PROCEDURES PERFORMED  1. Fistulogram of right arteriovenous fistula.  2. Balloon angioplasty of right innominate vein/venous outflow.    SURGEON: Lenn Sink, MD.    ANESTHESIA: Local sedation.    DRAINS: None.    COMPLICATIONS: None.    SPECIMENS REMOVED: None.    ESTIMATED BLOOD LOSS: None.    INDICATIONS FOR PROCEDURE: The patient is a 31 year old end-stage renal  disease patient who gets dialyzed through a right arm basilic vein  transposition fistula, who just had signs of venous hypertension with  excessive bleeding and presents for fistulogram.    DESCRIPTION OF PROCEDURE: The patient was placed supine on the angiography  table, and the right arm was prepped and draped in standard surgical  fashion. The fistula was accessed antegrade near the anastomosis and with  an 18-gauge needle, and this was exchanged for a 6-French sheath. A  fistulogram was performed, revealing only high-grade stenosis of the right  innominate vein. The rest of the fistula was patent and without stenosis. A  Glidewire was passed through this, and an 8-mm x 60-mm angioplasty balloon  was used to angioplasty this with some improvement. This was then  angioplastied again with a 10 x 40 mm Conquest angioplasty balloon. This is  right near the first rib/clavicular junction, and this remained fairly  tight but was ballooned up to 10 mm, which should result in some  improvement. After this, the wires and catheters were removed and figure of  8 sutures placed to close the sheath site over a bolster.  Dry sterile  dressing was applied. The patient was awakened and transported to recovery  room in stable condition.        Reviewed on 06/08/2012 8:34 PM          Lenn Sink, MD    cc:   Arnetha Massy, MD        Lenn Sink, MD        AM/wmx; D: 06/08/2012 12:43 P; T: 06/08/2012 06:27 P; Doc# [id number]; Job#  [id number]

## 2012-06-08 NOTE — Progress Notes (Signed)
Bedside shift change report given to Annya (oncoming nurse) by Sam (offgoing nurse).  Report given with SBAR, Procedure Summary, Intake/Output, MAR and Recent Results.

## 2012-06-08 NOTE — Progress Notes (Signed)
Nephrology Progress Note    Date of Admission : 05/24/2012    CC:  Follow up for ESRD       Assessment and Plan   ESRD : persistent poor clearances despite aggressive dialysis. 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  Kayexalate and Insulin / D50 prior to fistulogram   Anemia : SC D + ESRD. Check CBC at HD. Has not been getting Epo at HD. Reordered it      HTN :uncontrolled.  SCC: per Hematology    LLL PNA : per ID    For other plans, see orders.    Interval History:  Appreciate Dr Crosby Oyster help with fistulogram  Labs today reflect poor clearances      Current Facility-Administered Medications   Medication Dose Route Frequency   ??? insulin regular (NOVOLIN, HUMULIN) injection 10 Units  10 Units IntraVENous ONCE   ??? dextrose (D50W) injection Syrg 25 g  25 g IntraVENous ONCE   ??? sodium polystyrene (KAYEXALATE) 15 gram/60 mL oral suspension 15 g  15 g Oral NOW   ??? epoetin alfa (EPOGEN;PROCRIT) injection 20,000 Units  20,000 Units SubCUTAneous Q MON, WED & FRI   ??? hydrALAZINE (APRESOLINE) tablet 100 mg  100 mg Oral TID   ??? heparin (porcine) 1,000 unit/mL injection 1,000 Units  1,000 Units Hemodialysis ONCE   ??? insulin glargine (LANTUS) injection 2 Units  2 Units SubCUTAneous DAILY   ??? lisinopril (PRINIVIL, ZESTRIL) tablet 40 mg  40 mg Oral DAILY   ??? metoprolol (LOPRESSOR) tablet 100 mg  100 mg Oral BID   ??? HYDROmorphone (DILAUDID) injection 3 mg  3 mg IntraVENous Q2H PRN   ??? 0.9% sodium chloride infusion  25 mL/hr IntraVENous CONTINUOUS   ??? acetaminophen (TYLENOL) tablet 650 mg  650 mg Oral Q4H PRN   ??? amLODIPine (NORVASC) tablet 10 mg  10 mg Oral DAILY   ??? metoclopramide HCl (REGLAN) injection 10 mg  10 mg IntraVENous Q6H PRN   ??? hydrALAZINE (APRESOLINE) tablet 10 mg  10 mg Oral Q6H PRN   ??? insulin lispro (HUMALOG) injection   SubCUTAneous TIDAC   ??? calcium acetate (PHOSLO) tablet 1,334 mg  2 Tab Oral TID WITH MEALS   ??? sodium chloride (NS) flush 5-10 mL  5-10 mL IntraVENous Q8H   ???  sodium chloride (NS) flush 5-10 mL  5-10 mL IntraVENous PRN   ??? heparin (porcine) injection 5,000 Units  5,000 Units SubCUTAneous Q8H   ??? 0.9% sodium chloride infusion  75 mL/hr IntraVENous CONTINUOUS   ??? B complex-vitaminC-folic acid (NEPHROCAP) cap  1 Cap Oral DAILY   ??? diphenhydrAMINE (BENADRYL) injection 12.5 mg  12.5 mg IntraVENous Q6H PRN   ??? nicotine (NICODERM CQ) 14 mg/24 hr patch 1 Patch  1 Patch TransDERmal Q24H   ??? glucose chewable tablet 16 g  4 Tab Oral PRN   ??? dextrose (D50W) injection Syrg 12.5-25 g  12.5-25 g IntraVENous PRN   ??? glucagon (GLUCAGEN) injection 1 mg  1 mg IntraMUSCular PRN        Review of Systems:  Pertinent items are noted in HPI.    Objective:  Vitals:    Filed Vitals:    06/07/12 1510 06/07/12 1603 06/07/12 2045 06/08/12 0253   BP: 166/83 143/82 162/80 160/86   Pulse: 77 80 76 76   Temp:  98.1 ??F (36.7 ??C) 99.5 ??F (37.5 ??C) 99.3 ??F (37.4 ??C)   Resp:  16 16 16  Height:       Weight:       SpO2:  96% 100% 96%     Intake and Output:     06/19 1900 - 06/21 0659  In: 120 [P.O.:120]  Out: 3000     Physical Examination:  General: No distress   Neck:  Supple, no mass  Resp:  Lungs CTA B/L, no wheezing , normal respiratory effort  CV:  RRR,  no murmur or rub,trace  LE edema  GI:  Soft, NT, + Bowel sounds, no hepatosplenomegaly  Neurologic:  Non focal  Psych:             AAO x 3 appropriate affect   Skin:  No Rash. Multiple acneform lesions on face       []     High complexity decision making was performed  []     Patient is at high-risk of decompensation with multiple organ involvement    Lab Data Personally Reviewed: I have reviewed all the pertinent labs, microbiology data and radiology studies during assessment.    Recent Labs   [facility] 06/08/12 0349 06/07/12 0315    NA 134* 136    K 5.8* 6.1*    CL 99 106    CO2 28 23    GLU 80 76    BUN 34* 38*    CREA 5.29* 5.97*    CA 8.0* 8.3*    MG -- --    PHOS -- --    ALB -- --    TBIL -- --    SGOT -- --    INR -- --     Recent Labs   Basename  06/08/12 0349 06/07/12 0315 06/06/12 1410    WBC 2.8* 2.9* 2.7*    HGB 8.1* 7.9* 7.9*    HCT 24.9* 24.9* 24.9*    PLT 120* 97* 103*     Lab Results   Component Value Date/Time    Specimen Description: BLOOD 05/24/2012 10:47 AM     Lab Results   Component Value Date/Time    Culture result: NO GROWTH 5 DAYS 05/24/2012 10:47 AM     Recent Results (from the past 24 hour(s))   GLUCOSE, POC    Collection Time    06/07/12 11:18 AM       Component Value Range    POC GLUCOSE 72 (*) 75 - 110 mg/dL    Performed by Huey Romans     GLUCOSE, POC    Collection Time    06/07/12 12:21 PM       Component Value Range    POC GLUCOSE 97  75 - 110 mg/dL    Performed by GRUBBS JEAN     GLUCOSE, POC    Collection Time    06/07/12  4:02 PM       Component Value Range    POC GLUCOSE 103  75 - 110 mg/dL    Performed by Huey Romans     GLUCOSE, POC    Collection Time    06/07/12  9:23 PM       Component Value Range    POC GLUCOSE 107  75 - 110 mg/dL    Performed by Dow Adolph     CBC WITH AUTOMATED DIFF    Collection Time    06/08/12  3:49 AM       Component Value Range    WBC 2.8 (*) 4.1 - 11.1 K/uL    RBC 2.66 (*) 4.10 - 5.70 M/uL  HGB 8.1 (*) 12.1 - 17.0 g/dL    HCT 14.7 (*) 82.9 - 50.3 %    MCV 93.6  80.0 - 99.0 FL    MCH 30.5  26.0 - 34.0 PG    MCHC 32.5  30.0 - 36.5 g/dL    RDW 56.2 (*) 13.0 - 14.5 %    PLATELET 120 (*) 150 - 400 K/uL    NEUTROPHILS 54  32 - 75 %    LYMPHOCYTES 30  12 - 49 %    MONOCYTES 9  5 - 13 %    EOSINOPHILS 6  0 - 7 %    BASOPHILS 1  0 - 1 %    ABS. NEUTROPHILS 1.5 (*) 1.8 - 8.0 K/UL    ABS. LYMPHOCYTES 0.9  0.8 - 3.5 K/UL    ABS. MONOCYTES 0.2  0.0 - 1.0 K/UL    ABS. EOSINOPHILS 0.2  0.0 - 0.4 K/UL    ABS. BASOPHILS 0.0  0.0 - 0.1 K/UL   METABOLIC PANEL, BASIC    Collection Time    06/08/12  3:49 AM       Component Value Range    Sodium 134 (*) 136 - 145 MMOL/L    Potassium 5.8 (*) 3.5 - 5.1 MMOL/L    Chloride 99  97 - 108 MMOL/L    CO2 28  21 - 32 MMOL/L    Anion gap 7  5 - 15 mmol/L    Glucose 80  65 - 100  MG/DL    BUN 34 (*) 6 - 20 MG/DL    Creatinine 8.65 (*) 0.45 - 1.15 MG/DL    BUN/Creatinine ratio 6 (*) 12 - 20      GFR est-AA 15 (*) >60 ml/min/1.81m2    GFR est non-AA 13 (*) >60 ml/min/1.7m2    Calcium 8.0 (*) 8.5 - 10.1 MG/DL   GLUCOSE, POC    Collection Time    06/08/12  6:20 AM       Component Value Range    POC GLUCOSE 91  75 - 110 mg/dL    Performed by Rod Holler         Total time spent with patient:  15  min.                               Care Plan discussed with:  Patient  x   Family      RN  x    Consulting Physician /Specialist        I have reviewed the flowsheets.  Chart reviewed. Pertinent Notes reviewed.   Current Medications list reviewed by me.  No change in PMH ,family and social history from Consult note.    Rhae Hammock, MD  06/08/2012    [facility]  [address], [address],   [place], [place] [address]  Phone - [phone]   Fax: [phone]  [email or web address]

## 2012-06-08 NOTE — Progress Notes (Signed)
Paged Dr. Griselda Miner regarding insulin ordered IV. Per charge nurse, unable to administer IV insulin on this floor. MD notified and ordered to discontinue the insulin and dextrose, while just administering the kaexolate.

## 2012-06-08 NOTE — Progress Notes (Signed)
TRANSFER - OUT REPORT:    Verbal report given to Sam(name) on [employer]  being transferred to 5W(unit) for routine progression of care       Report consisted of patient???s Situation, Background, Assessment and   Recommendations(SBAR).     Information from the following report(s) Procedure Summary, MAR and Recent Results was reviewed with the receiving nurse.    Opportunity for questions and clarification was provided.

## 2012-06-08 NOTE — Progress Notes (Signed)
Hematology-Oncology Progress Note    Tyler Ballard  12-Oct-1981  [id number]  06/08/2012    Follow-up for: sickle cell anemia     [x]         Chart notes since last visit reviewed   [x]         Medications reviewed for allergies and interactions       Case discussed with the following:         []         Case Manager                    []         Nursing Staff                                                                         []         Pathologist                                                                        []         FAMILY      Subjective:     Spoke with patient who complains of: pt. Seems comfortable at present, informs me that he still has swelling in the legs which is painful having fistulogram today    Objective:     Patient Vitals for the past 24 hrs:   BP Temp Pulse Resp SpO2   06/08/12 0838 160/92 mmHg 98.2 ??F (36.8 ??C) 70  16  97 %   06/08/12 0253 160/86 mmHg 99.3 ??F (37.4 ??C) 76  16  96 %   06/07/12 2045 162/80 mmHg 99.5 ??F (37.5 ??C) 76  16  100 %   06/07/12 1603 143/82 mmHg 98.1 ??F (36.7 ??C) 80  16  96 %   06/07/12 1510 166/83 mmHg - 77  - -   06/07/12 1500 166/88 mmHg - 82  - -   06/07/12 1430 188/98 mmHg - 83  - -   06/07/12 1400 172/86 mmHg - 82  - -   06/07/12 1330 156/75 mmHg - 75  - -   06/07/12 1300 138/84 mmHg - 75  - -   06/07/12 1230 153/87 mmHg - 77  - -   06/07/12 1210 152/81 mmHg - 76  - -   06/07/12 1200 140/77 mmHg - 75  - -   06/07/12 0923 - - - - 93 %       REVIEW OF SYSTEMS:    Constitutional: negative fever, negative chills, negative weight loss  Eyes:   negative visual changes  ENT:   negative sore throat, tongue or lip swelling  Respiratory:  negative cough, negative dyspnea  Cards:  negative for chest pain, palpitations, lower extremity edema  GI:   negative for nausea, vomiting, diarrhea, and abdominal pain  Neuro:  negative for headaches, dizziness, vertigo  [x]   Full ROS o/w normal/non contributor    Constitutional:  Patient looks  []         Sick   []         Frail  [x]         Better                                                 []         Depressed    HEENT:  [x]    NC                         []    AT               []     ALOPECIA           Eyes: [x]    Normal               []     Icteric  Oropharynx: [x]     Normal                  []   Thrush               []    Dry  Mucositis: [x]     None                 Grade: []         I  []         II  []         III  []         IV  Neck:   [x]    Supple                  []   Rigid               JVD:    [x]    ABSENT       []    PRESENT  Lymphadenopathy:   []    None Noted            []    PRESENT    Chest:  [x]    Clear               []     Rhonchi                      Dec'd @     []   Right Base           []    Left Base    CV:             [x]    Regular              []   Irregular               []    Tachy                []    Murmur  Abdominal:   [x]     Soft              []    NON-tender               []    Tender      BS:    [x]    ABSENT                   []   PRESENT  Liver:     [x]   NON-palp                  []    EDGE- palp  Spleen: [x]    NON-palp                   []   EDGE - palp  Mass:   [x]    ABSENT                          []   PRESENT  Extr:    []   Lymphedema             []    Cyanosis      []   Clubbing  Edema:     []    NONE       []    PRESENT  Skin:  Intact [x]            Purpura []         Rash: [x]    ABSENT       []   PRESENT  Neuro:  [x]         Normal  []         Confused      Available labs reviewed:  Labs:    Recent Results (from the past 24 hour(s))   GLUCOSE, POC    Collection Time    06/07/12 11:18 AM       Component Value Range    POC GLUCOSE 72 (*) 75 - 110 mg/dL    Performed by Huey Romans     GLUCOSE, POC    Collection Time    06/07/12 12:21 PM       Component Value Range    POC GLUCOSE 97  75 - 110 mg/dL    Performed by GRUBBS JEAN     GLUCOSE, POC    Collection Time    06/07/12  4:02 PM       Component Value Range    POC GLUCOSE 103  75 - 110 mg/dL    Performed by Huey Romans     GLUCOSE, POC    Collection Time    06/07/12   9:23 PM       Component Value Range    POC GLUCOSE 107  75 - 110 mg/dL    Performed by Dow Adolph     CBC WITH AUTOMATED DIFF    Collection Time    06/08/12  3:49 AM       Component Value Range    WBC 2.8 (*) 4.1 - 11.1 K/uL    RBC 2.66 (*) 4.10 - 5.70 M/uL    HGB 8.1 (*) 12.1 - 17.0 g/dL    HCT 16.1 (*) 09.6 - 50.3 %    MCV 93.6  80.0 - 99.0 FL    MCH 30.5  26.0 - 34.0 PG    MCHC 32.5  30.0 - 36.5 g/dL    RDW 04.5 (*) 40.9 - 14.5 %    PLATELET 120 (*) 150 - 400 K/uL    NEUTROPHILS 54  32 - 75 %    LYMPHOCYTES 30  12 - 49 %    MONOCYTES 9  5 - 13 %    EOSINOPHILS 6  0 - 7 %    BASOPHILS 1  0 - 1 %    ABS. NEUTROPHILS 1.5 (*) 1.8 - 8.0 K/UL    ABS. LYMPHOCYTES 0.9  0.8 - 3.5 K/UL  ABS. MONOCYTES 0.2  0.0 - 1.0 K/UL    ABS. EOSINOPHILS 0.2  0.0 - 0.4 K/UL    ABS. BASOPHILS 0.0  0.0 - 0.1 K/UL   METABOLIC PANEL, BASIC    Collection Time    06/08/12  3:49 AM       Component Value Range    Sodium 134 (*) 136 - 145 MMOL/L    Potassium 5.8 (*) 3.5 - 5.1 MMOL/L    Chloride 99  97 - 108 MMOL/L    CO2 28  21 - 32 MMOL/L    Anion gap 7  5 - 15 mmol/L    Glucose 80  65 - 100 MG/DL    BUN 34 (*) 6 - 20 MG/DL    Creatinine 1.61 (*) 0.45 - 1.15 MG/DL    BUN/Creatinine ratio 6 (*) 12 - 20      GFR est-AA 15 (*) >60 ml/min/1.82m2    GFR est non-AA 13 (*) >60 ml/min/1.2m2    Calcium 8.0 (*) 8.5 - 10.1 MG/DL   GLUCOSE, POC    Collection Time    06/08/12  6:20 AM       Component Value Range    POC GLUCOSE 91  75 - 110 mg/dL    Performed by Rod Holler     GLUCOSE, POC    Collection Time    06/08/12  7:45 AM       Component Value Range    POC GLUCOSE 87  75 - 110 mg/dL    Performed by Rod Holler         Available Xrays reviewed:    Chemotherapy monitored and toxicities assessed:    Assessment and Plan   1. Sickle cell pain crisis... Probably precipitated by pneumonia...Marland Kitchenhe is still requiring IV pain meds, he looks much more  Comfortable ,.suspect he will allow oral pain meds once the dialysis "takes more fluid off his legs"   is followed at [facility] in the fellow's clinic  2. Anemia..hgb 8.1  Stable x 3 days, no transfusions ordered today  Secondary to #1 +/_ esrd...  3. Pneumonia... Cultures negative so far, will continue antibiotics,off hydrea , cxr looks better on my evaluation  4. ESRD... Continue dialysis per renal  [facility] Labs, MD

## 2012-06-08 NOTE — Progress Notes (Signed)
Dr. Nathaneil Canary questioned about administering heparin prior to fistulogram. Md stated that" it is ok to give the heparin".

## 2012-06-08 NOTE — Progress Notes (Signed)
Patient transported to room 537 via stretcher.  Reported off patient's blood glucose and the necessity to recheck per protocol.  RN @ bedside for update & right upper arm site check performed; dry & intact.

## 2012-06-09 LAB — CBC WITH AUTOMATED DIFF
ABS. BASOPHILS: 0 10*3/uL (ref 0.0–0.1)
ABS. EOSINOPHILS: 0.2 10*3/uL (ref 0.0–0.4)
ABS. LYMPHOCYTES: 0.7 10*3/uL — ABNORMAL LOW (ref 0.8–3.5)
ABS. MONOCYTES: 0.3 10*3/uL (ref 0.0–1.0)
ABS. NEUTROPHILS: 1.4 10*3/uL — ABNORMAL LOW (ref 1.8–8.0)
BASOPHILS: 1 % (ref 0–1)
EOSINOPHILS: 8 % — ABNORMAL HIGH (ref 0–7)
HCT: 26.9 % — ABNORMAL LOW (ref 36.6–50.3)
HGB: 8.6 g/dL — ABNORMAL LOW (ref 12.1–17.0)
LYMPHOCYTES: 26 % (ref 12–49)
MCH: 30.3 PG (ref 26.0–34.0)
MCHC: 32 g/dL (ref 30.0–36.5)
MCV: 94.7 FL (ref 80.0–99.0)
MONOCYTES: 12 % (ref 5–13)
NEUTROPHILS: 53 % (ref 32–75)
PLATELET: 128 10*3/uL — ABNORMAL LOW (ref 150–400)
RBC: 2.84 M/uL — ABNORMAL LOW (ref 4.10–5.70)
RDW: 14.7 % — ABNORMAL HIGH (ref 11.5–14.5)
WBC: 2.7 10*3/uL — ABNORMAL LOW (ref 4.1–11.1)

## 2012-06-09 LAB — GLUCOSE, POC
Glucose (POC): 79 mg/dL (ref 75–110)
Glucose (POC): 85 mg/dL (ref 75–110)
Glucose (POC): 138 mg/dL — ABNORMAL HIGH (ref 75–110)
Glucose (POC): 140 mg/dL — ABNORMAL HIGH (ref 75–110)
Glucose (POC): 88 mg/dL (ref 75–110)

## 2012-06-09 LAB — METABOLIC PANEL, BASIC
Anion gap: 7 mmol/L (ref 5–15)
BUN/Creatinine ratio: 5 — ABNORMAL LOW (ref 12–20)
BUN: 23 MG/DL — ABNORMAL HIGH (ref 6–20)
CO2: 28 MMOL/L (ref 21–32)
Calcium: 8.9 MG/DL (ref 8.5–10.1)
Chloride: 99 MMOL/L (ref 97–108)
Creatinine: 4.6 MG/DL — ABNORMAL HIGH (ref 0.45–1.15)
GFR est AA: 18 mL/min/{1.73_m2} — ABNORMAL LOW (ref >60)
GFR est non-AA: 15 mL/min/{1.73_m2} — ABNORMAL LOW (ref >60)
Glucose: 82 MG/DL (ref 65–100)
Potassium: 4.4 MMOL/L (ref 3.5–5.1)
Sodium: 134 MMOL/L — ABNORMAL LOW (ref 136–145)

## 2012-06-09 MED ADMIN — HYDROmorphone (DILAUDID) injection 3 mg: INTRAVENOUS | NDC 00641012121

## 2012-06-09 MED ADMIN — HYDROmorphone (DILAUDID) injection 3 mg: INTRAVENOUS | @ 10:00:00 | NDC 00641012121

## 2012-06-09 MED ADMIN — calcium acetate (PHOSLO) tablet 1,334 mg: ORAL | @ 14:00:00 | NDC 00574011302

## 2012-06-09 MED ADMIN — hydrALAZINE (APRESOLINE) tablet 100 mg: ORAL | @ 14:00:00 | NDC 62584073411

## 2012-06-09 MED ADMIN — heparin (porcine) injection 5,000 Units: SUBCUTANEOUS | @ 20:00:00 | NDC 25021040201

## 2012-06-09 MED ADMIN — metoprolol (LOPRESSOR) tablet 100 mg: ORAL | @ 01:00:00 | NDC 62584026611

## 2012-06-09 MED ADMIN — sodium chloride (NS) flush 5-10 mL: INTRAVENOUS | @ 18:00:00 | NDC 82903065462

## 2012-06-09 MED ADMIN — sodium chloride (NS) flush 5-10 mL: INTRAVENOUS | @ 10:00:00 | NDC 87701099893

## 2012-06-09 MED ADMIN — diphenhydrAMINE (BENADRYL) injection 12.5 mg: INTRAVENOUS | @ 05:00:00 | NDC 00641037621

## 2012-06-09 MED ADMIN — sodium chloride (NS) flush 5-10 mL: INTRAVENOUS | @ 02:00:00 | NDC 82903065462

## 2012-06-09 MED ADMIN — heparin (porcine) injection 5,000 Units: SUBCUTANEOUS | @ 02:00:00 | NDC 25021040201

## 2012-06-09 MED ADMIN — calcium acetate (PHOSLO) tablet 1,334 mg: ORAL | @ 20:00:00 | NDC 00574011302

## 2012-06-09 MED ADMIN — HYDROmorphone (DILAUDID) injection 3 mg: INTRAVENOUS | @ 11:00:00 | NDC 00641012121

## 2012-06-09 MED ADMIN — HYDROmorphone (DILAUDID) injection 3 mg: INTRAVENOUS | @ 14:00:00 | NDC 00641012121

## 2012-06-09 MED ADMIN — lisinopril (PRINIVIL, ZESTRIL) tablet 40 mg: ORAL | @ 14:00:00 | NDC 68084006211

## 2012-06-09 MED ADMIN — HYDROmorphone (DILAUDID) injection 3 mg: INTRAVENOUS | @ 08:00:00 | NDC 00641012121

## 2012-06-09 MED ADMIN — HYDROmorphone (DILAUDID) injection 3 mg: INTRAVENOUS | @ 20:00:00 | NDC 00641012121

## 2012-06-09 MED ADMIN — insulin glargine (LANTUS) injection 2 Units: SUBCUTANEOUS | @ 14:00:00 | NDC 09999222001

## 2012-06-09 MED ADMIN — HYDROmorphone (DILAUDID) injection 3 mg: INTRAVENOUS | @ 01:00:00 | NDC 00641012121

## 2012-06-09 MED ADMIN — amLODIPine (NORVASC) tablet 10 mg: ORAL | @ 14:00:00 | NDC 59762153006

## 2012-06-09 MED ADMIN — hydrALAZINE (APRESOLINE) tablet 100 mg: ORAL | @ 20:00:00 | NDC 62584073411

## 2012-06-09 MED ADMIN — heparin (porcine) injection 5,000 Units: SUBCUTANEOUS | @ 10:00:00 | NDC 25021040201

## 2012-06-09 MED ADMIN — calcium acetate (PHOSLO) tablet 1,334 mg: ORAL | @ 16:00:00 | NDC 00574011302

## 2012-06-09 MED ADMIN — hydrALAZINE (APRESOLINE) tablet 10 mg: ORAL | @ 02:00:00 | NDC 68084044711

## 2012-06-09 MED ADMIN — diphenhydrAMINE (BENADRYL) injection 12.5 mg: INTRAVENOUS | @ 18:00:00 | NDC 00641037621

## 2012-06-09 MED ADMIN — metoprolol (LOPRESSOR) tablet 100 mg: ORAL | @ 14:00:00 | NDC 62584026611

## 2012-06-09 MED ADMIN — HYDROmorphone (DILAUDID) injection 3 mg: INTRAVENOUS | @ 05:00:00 | NDC 00641012121

## 2012-06-09 MED ADMIN — HYDROmorphone (DILAUDID) injection 3 mg: INTRAVENOUS | @ 18:00:00 | NDC 00641012121

## 2012-06-09 MED ADMIN — HYDROmorphone (DILAUDID) injection 3 mg: INTRAVENOUS | @ 16:00:00 | NDC 00641012121

## 2012-06-09 MED ADMIN — HYDROmorphone (DILAUDID) injection 3 mg: INTRAVENOUS | @ 22:00:00 | NDC 00641012121

## 2012-06-09 MED ADMIN — diphenhydrAMINE (BENADRYL) injection 12.5 mg: INTRAVENOUS | NDC 00641037621

## 2012-06-09 MED ADMIN — sodium chloride (NS) flush 5-10 mL: INTRAVENOUS | @ 05:00:00 | NDC 87701099893

## 2012-06-09 MED ADMIN — diphenhydrAMINE (BENADRYL) injection 12.5 mg: INTRAVENOUS | @ 11:00:00 | NDC 00641037621

## 2012-06-09 MED ADMIN — hydrALAZINE (APRESOLINE) tablet 100 mg: ORAL | @ 02:00:00 | NDC 50111032801

## 2012-06-09 MED ADMIN — HYDROmorphone (DILAUDID) injection 3 mg: INTRAVENOUS | @ 04:00:00 | NDC 00641012121

## 2012-06-09 MED ADMIN — sodium chloride (NS) flush 5-10 mL: INTRAVENOUS | @ 08:00:00 | NDC 87701099893

## 2012-06-09 MED FILL — BD POSIFLUSH NORMAL SALINE 0.9 % INJECTION SYRINGE: INTRAMUSCULAR | Qty: 20

## 2012-06-09 MED FILL — NEPHROCAPS 1 MG CAPSULE: 1 mg | ORAL | Qty: 1

## 2012-06-09 MED FILL — CALCIUM ACETATE 667 MG TAB: 667 mg | ORAL | Qty: 2

## 2012-06-09 MED FILL — NICOTINE 14 MG/24 HR DAILY PATCH: 14 mg/24 hr | TRANSDERMAL | Qty: 1

## 2012-06-09 MED FILL — BD POSIFLUSH NORMAL SALINE 0.9 % INJECTION SYRINGE: INTRAMUSCULAR | Qty: 10

## 2012-06-09 MED FILL — INSULIN GLARGINE 100 UNIT/ML INJECTION: 100 unit/mL | SUBCUTANEOUS | Qty: 0.02

## 2012-06-09 MED FILL — DIPHENHYDRAMINE HCL 50 MG/ML IJ SOLN: 50 mg/mL | INTRAMUSCULAR | Qty: 1

## 2012-06-09 MED FILL — HEPARIN (PORCINE) 5,000 UNIT/ML IJ SOLN: 5000 unit/mL | INTRAMUSCULAR | Qty: 1

## 2012-06-09 MED FILL — HYDROMORPHONE 2 MG/ML INJECTION SOLUTION: 2 mg/mL | INTRAMUSCULAR | Qty: 2

## 2012-06-09 MED FILL — HYDRALAZINE 50 MG TAB: 50 mg | ORAL | Qty: 2

## 2012-06-09 MED FILL — METOPROLOL TARTRATE 50 MG TAB: 50 mg | ORAL | Qty: 2

## 2012-06-09 MED FILL — HYDRALAZINE 10 MG TAB: 10 mg | ORAL | Qty: 1

## 2012-06-09 MED FILL — LISINOPRIL 20 MG TAB: 20 mg | ORAL | Qty: 2

## 2012-06-09 MED FILL — AMLODIPINE 5 MG TAB: 5 mg | ORAL | Qty: 2

## 2012-06-09 MED FILL — METOPROLOL TARTRATE 50 MG TAB: 50 mg | ORAL | Qty: 1

## 2012-06-09 NOTE — Progress Notes (Signed)
-  Hematology / Oncology ([facility]) -    -S-  About the same    -O-    Visit Vitals   Item Reading   ??? BP 159/85   ??? Pulse 71   ??? Temp 98.8 ??F (37.1 ??C)   ??? Resp 18   ??? Ht 5\' 5"  (1.651 m)   ??? Wt 203 lb 4.2 oz   ??? BMI 33.82 kg/m2   ??? SpO2 94%     Sitting up in chair, nad  ctab  rrr  s/nt      -Labs-    2.7 > 8.6 < 128  Cr 4.6    -Assessment + Plan-       1. Sickle cell pain crisis... Probably precipitated by pneumonia...Marland Kitchenhe is still requiring IV pain meds, he looks much more Comfortable ,.suspect he will allow oral pain meds once the dialysis "takes more fluid off his legs" is followed at [facility] in the fellow's clinic  2. Anemia..hgb stable, no transfusions ordered today Secondary to #1 +/_ esrd...  3. Pneumonia... Cultures negative so far, will continue antibiotics,off hydrea , cxr looked better   4. ESRD... Continue dialysis per renal

## 2012-06-09 NOTE — Progress Notes (Signed)
Hospitalist Progress Note         Samul Dada, MD PharmD    Call physician on-call through the operator 7pm-7am    Daily Progress Note: 06/09/2012    Assessment/Plan:   1. LLB pneumonia, poa- Improving with abx.  2. Sickle cell crisis, poa - resolved.  Followed by Hem/onc  3. ESRD on HD - HD as per nephrology . S/p balloon. Stable without signs of acute bleed. Followed by nephrology service.  4. Hyperkalemia: Resolved. Secondary to hemolysis,/esrd.   5. Anemia - s/p transfusion  6.  Htn.  Controlled/bp stable with pain management and dialysis.  7. DM- lantus, ssi   8. Tobaco abuse - nicotine patch, counseled from admission  9. Chronic pain - pain management as per hematology , stable  10. Thrombocytopenia: stable plt.  Continue to observe  11. DVT prophylaxis- heparin       Subjective:   The patient is without any acute complaints at this time.        Objective:   Physical Exam:     BP 149/75   Pulse 78   Temp 97.5 ??F (36.4 ??C)   Resp 16   Ht 5\' 5"  (1.651 m)   Wt 203 lb 4.2 oz   BMI 33.82 kg/m2   SpO2 97% O2 Flow Rate (L/min): 2 l/min O2 Device: Nasal cannula    Temp (24hrs), Avg:98.4 ??F (36.9 ??C), Min:97.5 ??F (36.4 ??C), Max:99.5 ??F (37.5 ??C)        06/20 1900 - 06/22 0659  In: -   Out: 4500     General:  Alert, cooperative, no distress, appears stated age.   Lungs:   Clear to auscultation bilaterally.   Chest wall:  No tenderness or deformity.   Heart:  Regular rate and rhythm, s1s2   Abdomen:   Soft, non-tender. Bowel sounds normal. No masses   Extremities: Extremities LE edema, surgical sutures in RUE.   Pulses: 1 and symmetric all extremities.   Skin: No skin breakdown   Neurologic: alert     Hem/Oncology note:  Sickle cell pain crisis... Probably precipitated by pneumonia...Marland Kitchenhe is still requiring IV pain meds, he looks much more Comfortable ,.suspect he will allow oral pain meds once the dialysis "takes more fluid off his legs" is followed at [facility] in the fellow's clinic Anemia..hgb  stable, no transfusions ordered today Secondary to #1 +/_ esrd... Pneumonia... Cultures negative so far, will continue antibiotics,off hydrea , cxr looked better ESRD... 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 06/09/12 0556 06/08/12 0349 06/07/12 0315    WBC 2.7* 2.8* 2.9*    HGB 8.6* 8.1* 7.9*    HCT 26.9* 24.9* 24.9*    PLT 128* 120* 97*     Recent Labs   Basename 06/09/12 0556 06/08/12 0349 06/07/12 0315    NA 134* 134* 136    K 4.4 5.8* 6.1*    CL 99 99 106    CO2 28 28 23     GLU 82 80 76    BUN 23* 34* 38*    CREA 4.60* 5.29* 5.97*    CA 8.9 8.0* 8.3*    MG -- -- --    PHOS -- -- --  ALB -- -- --    TBIL -- -- --    SGOT -- -- --    INR -- -- --     No results found for this basename: PH:3,PCO2:3,PO2:3,HCO3:3,FIO2:3 in the last 72 hours    24 Hour Results:  Recent Results (from the past 24 hour(s))   GLUCOSE, POC    Collection Time    06/08/12  9:38 PM       Component Value Range    POC GLUCOSE 79  75 - 110 mg/dL    Performed by Gerlean Ren     GLUCOSE, POC    Collection Time    06/08/12  9:59 PM       Component Value Range    POC GLUCOSE 85  75 - 110 mg/dL    Performed by LANGE DON     CBC WITH AUTOMATED DIFF    Collection Time    06-24-2012  5:56 AM       Component Value Range    WBC 2.7 (*) 4.1 - 11.1 K/uL    RBC 2.84 (*) 4.10 - 5.70 M/uL    HGB 8.6 (*) 12.1 - 17.0 g/dL    HCT 16.1 (*) 09.6 - 50.3 %    MCV 94.7  80.0 - 99.0 FL    MCH 30.3  26.0 - 34.0 PG    MCHC 32.0  30.0 - 36.5 g/dL    RDW 04.5 (*) 40.9 - 14.5 %    PLATELET 128 (*) 150 - 400 K/uL    NEUTROPHILS 53  32 - 75 %    LYMPHOCYTES 26  12 - 49 %    MONOCYTES 12  5 - 13 %    EOSINOPHILS 8 (*) 0 - 7 %    BASOPHILS 1  0 - 1 %    ABS. NEUTROPHILS 1.4 (*) 1.8 - 8.0 K/UL    ABS. LYMPHOCYTES 0.7 (*) 0.8 - 3.5 K/UL    ABS.  MONOCYTES 0.3  0.0 - 1.0 K/UL    ABS. EOSINOPHILS 0.2  0.0 - 0.4 K/UL    ABS. BASOPHILS 0.0  0.0 - 0.1 K/UL   METABOLIC PANEL, BASIC    Collection Time    06-24-2012  5:56 AM       Component Value Range    Sodium 134 (*) 136 - 145 MMOL/L    Potassium 4.4  3.5 - 5.1 MMOL/L    Chloride 99  97 - 108 MMOL/L    CO2 28  21 - 32 MMOL/L    Anion gap 7  5 - 15 mmol/L    Glucose 82  65 - 100 MG/DL    BUN 23 (*) 6 - 20 MG/DL    Creatinine 8.11 (*) 0.45 - 1.15 MG/DL    BUN/Creatinine ratio 5 (*) 12 - 20      GFR est-AA 18 (*) >60 ml/min/1.66m2    GFR est non-AA 15 (*) >60 ml/min/1.76m2    Calcium 8.9  8.5 - 10.1 MG/DL   GLUCOSE, POC    Collection Time    June 24, 2012  7:13 AM       Component Value Range    POC GLUCOSE 138 (*) 75 - 110 mg/dL    Performed by Rod Holler         Problem List:  Problem List as of 2012/06/24  Never Reviewed      Codes Class Noted - Resolved    *Sickle cell crisis 282.62  05/24/2012 - Present  Medications reviewed  Current Facility-Administered Medications   Medication Dose Route Frequency   ??? insulin regular (NOVOLIN, HUMULIN) injection 10 Units  10 Units IntraVENous ONCE   ??? dextrose (D50W) injection Syrg 25 g  25 g IntraVENous ONCE   ??? 0.9% sodium chloride infusion 500 mL  500 mL IntraVENous CONTINUOUS   ??? atropine injection 1 mg  1 mg IntraVENous PRN   ??? EPINEPHrine (ADRENALIN 1:10,000) (1 mg/10 mL syringe) 1 mg  1 mg IntraVENous ONCE PRN   ??? heparin (porcine) 1,000 unit/mL injection 2,500-5,000 Units  2,500-5,000 Units IntraVENous ONCE PRN   ??? fentaNYL citrate (PF) injection 25-200 mcg  25-200 mcg IntraVENous Multiple   ??? heparinized saline 2 units/mL infusion 2,000 Units  1,000 mL Irrigation Multiple   ??? lidocaine (XYLOCAINE) 10 mg/mL (1 %) injection 10-30 mL  10-30 mL SubCUTAneous ONCE PRN   ??? midazolam (VERSED) injection 0.5-10 mg  0.5-10 mg IntraVENous Multiple   ??? saline peripheral flush Soln 10 mL  10 mL InterCATHeter PRN   ??? iodixanol (VISIPAQUE) 320 mg iodine/mL contrast injection  150-300 mL  150-300 mL IntraarTERial ONCE PRN   ??? ioversol (OPTIRAY) 350 mg iodine/mL contrast solution       ??? ioversol (OPTIRAY) 350 mg iodine/mL contrast solution 200 mL  200 mL IntraarTERial ONCE PRN   ??? sodium chloride 0.9 % injection       ??? sodium chloride (NS) 0.9 % flush       ??? sodium chloride 0.9 % injection       ??? sodium chloride 0.9 % injection       ??? epoetin alfa (EPOGEN;PROCRIT) injection 20,000 Units  20,000 Units SubCUTAneous Q MON, WED & FRI   ??? hydrALAZINE (APRESOLINE) tablet 100 mg  100 mg Oral TID   ??? insulin glargine (LANTUS) injection 2 Units  2 Units SubCUTAneous DAILY   ??? lisinopril (PRINIVIL, ZESTRIL) tablet 40 mg  40 mg Oral DAILY   ??? metoprolol (LOPRESSOR) tablet 100 mg  100 mg Oral BID   ??? HYDROmorphone (DILAUDID) injection 3 mg  3 mg IntraVENous Q2H PRN   ??? 0.9% sodium chloride infusion  25 mL/hr IntraVENous CONTINUOUS   ??? acetaminophen (TYLENOL) tablet 650 mg  650 mg Oral Q4H PRN   ??? amLODIPine (NORVASC) tablet 10 mg  10 mg Oral DAILY   ??? metoclopramide HCl (REGLAN) injection 10 mg  10 mg IntraVENous Q6H PRN   ??? hydrALAZINE (APRESOLINE) tablet 10 mg  10 mg Oral Q6H PRN   ??? insulin lispro (HUMALOG) injection   SubCUTAneous TIDAC   ??? calcium acetate (PHOSLO) tablet 1,334 mg  2 Tab Oral TID WITH MEALS   ??? sodium chloride (NS) flush 5-10 mL  5-10 mL IntraVENous Q8H   ??? sodium chloride (NS) flush 5-10 mL  5-10 mL IntraVENous PRN   ??? heparin (porcine) injection 5,000 Units  5,000 Units SubCUTAneous Q8H   ??? 0.9% sodium chloride infusion  75 mL/hr IntraVENous CONTINUOUS   ??? B complex-vitaminC-folic acid (NEPHROCAP) cap  1 Cap Oral DAILY   ??? diphenhydrAMINE (BENADRYL) injection 12.5 mg  12.5 mg IntraVENous Q6H PRN   ??? nicotine (NICODERM CQ) 14 mg/24 hr patch 1 Patch  1 Patch TransDERmal Q24H   ??? glucose chewable tablet 16 g  4 Tab Oral PRN   ??? dextrose (D50W) injection Syrg 12.5-25 g  12.5-25 g IntraVENous PRN   ??? glucagon (GLUCAGEN) injection 1 mg  1 mg IntraMUSCular PRN       Care  Plan  discussed with: patient    Total time spent with patient: 30 minutes.    Kalman Drape, MD

## 2012-06-09 NOTE — Progress Notes (Signed)
Bedside shift change report given to [place] [employer] and instrumentation engineer) by Leeroy Bock (offgoing nurse).  Report given with SBAR, Kardex and MAR.

## 2012-06-09 NOTE — Progress Notes (Signed)
Bedside and Verbal shift change report given to chelsea rn (oncoming nurse) by don rn (offgoing nurse).  Report given with SBAR, Kardex, MAR and Recent Results.

## 2012-06-09 NOTE — Progress Notes (Signed)
Spoke to Dr. Megan Mans and made her aware of BP 126/70. Metoprolol and hydralazine are ordered for 2200, received orders per Megan Mans to give metoprolol and hold hydralazine.

## 2012-06-10 LAB — GLUCOSE, POC
Glucose (POC): 68 mg/dL — ABNORMAL LOW (ref 75–110)
Glucose (POC): 79 mg/dL (ref 75–110)
Glucose (POC): 81 mg/dL (ref 75–110)
Glucose (POC): 108 mg/dL (ref 75–110)
Glucose (POC): 131 mg/dL — ABNORMAL HIGH (ref 75–110)

## 2012-06-10 MED ADMIN — sodium chloride (NS) flush 5-10 mL: INTRAVENOUS | @ 02:00:00 | NDC 82903065462

## 2012-06-10 MED ADMIN — heparin (porcine) injection 5,000 Units: SUBCUTANEOUS | @ 02:00:00 | NDC 25021040201

## 2012-06-10 MED ADMIN — metoprolol (LOPRESSOR) tablet 100 mg: ORAL | @ 03:00:00 | NDC 62584026611

## 2012-06-10 MED ADMIN — HYDROmorphone (DILAUDID) injection 3 mg: INTRAVENOUS | @ 10:00:00 | NDC 00641012121

## 2012-06-10 MED ADMIN — sodium chloride (NS) flush 5-10 mL: INTRAVENOUS | @ 10:00:00 | NDC 87701099893

## 2012-06-10 MED ADMIN — heparin (porcine) injection 5,000 Units: SUBCUTANEOUS | @ 17:00:00 | NDC 25021040201

## 2012-06-10 MED ADMIN — diphenhydrAMINE (BENADRYL) injection 12.5 mg: INTRAVENOUS | @ 18:00:00 | NDC 00641037621

## 2012-06-10 MED ADMIN — calcium acetate (PHOSLO) tablet 1,334 mg: ORAL | @ 12:00:00 | NDC 00574011302

## 2012-06-10 MED ADMIN — HYDROmorphone (DILAUDID) injection 3 mg: INTRAVENOUS | @ 12:00:00 | NDC 00641012121

## 2012-06-10 MED ADMIN — HYDROmorphone (DILAUDID) injection 3 mg: INTRAVENOUS | @ 08:00:00 | NDC 00641012121

## 2012-06-10 MED ADMIN — HYDROmorphone (DILAUDID) injection 3 mg: INTRAVENOUS | @ 23:00:00 | NDC 00641012121

## 2012-06-10 MED ADMIN — hydrALAZINE (APRESOLINE) tablet 100 mg: ORAL | @ 12:00:00 | NDC 62584073411

## 2012-06-10 MED ADMIN — heparin (porcine) injection 5,000 Units: SUBCUTANEOUS | @ 11:00:00 | NDC 25021040201

## 2012-06-10 MED ADMIN — HYDROmorphone (DILAUDID) injection 3 mg: INTRAVENOUS | @ 14:00:00 | NDC 00641012121

## 2012-06-10 MED ADMIN — calcium acetate (PHOSLO) tablet 1,334 mg: ORAL | @ 16:00:00 | NDC 00574011302

## 2012-06-10 MED ADMIN — HYDROmorphone (DILAUDID) injection 3 mg: INTRAVENOUS | @ 02:00:00 | NDC 00641012121

## 2012-06-10 MED ADMIN — HYDROmorphone (DILAUDID) injection 3 mg: INTRAVENOUS | @ 16:00:00 | NDC 00641012121

## 2012-06-10 MED ADMIN — HYDROmorphone (DILAUDID) injection 3 mg: INTRAVENOUS | @ 19:00:00 | NDC 00641012121

## 2012-06-10 MED ADMIN — diphenhydrAMINE (BENADRYL) injection 12.5 mg: INTRAVENOUS | @ 06:00:00 | NDC 63323066401

## 2012-06-10 MED ADMIN — HYDROmorphone (DILAUDID) injection 3 mg: INTRAVENOUS | @ 03:00:00 | NDC 00641012121

## 2012-06-10 MED ADMIN — hydrALAZINE (APRESOLINE) tablet 100 mg: ORAL | @ 21:00:00 | NDC 62584073411

## 2012-06-10 MED ADMIN — diphenhydrAMINE (BENADRYL) injection 12.5 mg: INTRAVENOUS | @ 12:00:00 | NDC 00641037621

## 2012-06-10 MED ADMIN — amLODIPine (NORVASC) tablet 10 mg: ORAL | @ 12:00:00 | NDC 59762153006

## 2012-06-10 MED ADMIN — sodium chloride (NS) flush 5-10 mL: INTRAVENOUS | @ 18:00:00 | NDC 87701099893

## 2012-06-10 MED ADMIN — lisinopril (PRINIVIL, ZESTRIL) tablet 40 mg: ORAL | @ 12:00:00 | NDC 68084006211

## 2012-06-10 MED ADMIN — metoprolol (LOPRESSOR) tablet 100 mg: ORAL | @ 12:00:00 | NDC 62584026611

## 2012-06-10 MED ADMIN — HYDROmorphone (DILAUDID) injection 3 mg: INTRAVENOUS | @ 06:00:00 | NDC 00641012121

## 2012-06-10 MED ADMIN — calcium acetate (PHOSLO) tablet 1,334 mg: ORAL | @ 21:00:00 | NDC 00574011302

## 2012-06-10 MED ADMIN — HYDROmorphone (DILAUDID) injection 3 mg: INTRAVENOUS | @ 21:00:00 | NDC 00641012121

## 2012-06-10 MED FILL — BD POSIFLUSH NORMAL SALINE 0.9 % INJECTION SYRINGE: INTRAMUSCULAR | Qty: 10

## 2012-06-10 MED FILL — HYDRALAZINE 50 MG TAB: 50 mg | ORAL | Qty: 2

## 2012-06-10 MED FILL — HYDROMORPHONE 2 MG/ML INJECTION SOLUTION: 2 mg/mL | INTRAMUSCULAR | Qty: 2

## 2012-06-10 MED FILL — HEPARIN (PORCINE) 5,000 UNIT/ML IJ SOLN: 5000 unit/mL | INTRAMUSCULAR | Qty: 1

## 2012-06-10 MED FILL — CALCIUM ACETATE 667 MG TAB: 667 mg | ORAL | Qty: 2

## 2012-06-10 MED FILL — NICOTINE 14 MG/24 HR DAILY PATCH: 14 mg/24 hr | TRANSDERMAL | Qty: 1

## 2012-06-10 MED FILL — AMLODIPINE 5 MG TAB: 5 mg | ORAL | Qty: 2

## 2012-06-10 MED FILL — DIPHENHYDRAMINE HCL 50 MG/ML IJ SOLN: 50 mg/mL | INTRAMUSCULAR | Qty: 1

## 2012-06-10 MED FILL — BD POSIFLUSH NORMAL SALINE 0.9 % INJECTION SYRINGE: INTRAMUSCULAR | Qty: 20

## 2012-06-10 MED FILL — METOPROLOL TARTRATE 50 MG TAB: 50 mg | ORAL | Qty: 2

## 2012-06-10 MED FILL — HYDRALAZINE 10 MG TAB: 10 mg | ORAL | Qty: 1

## 2012-06-10 MED FILL — LISINOPRIL 20 MG TAB: 20 mg | ORAL | Qty: 2

## 2012-06-10 MED FILL — INSULIN GLARGINE 100 UNIT/ML INJECTION: 100 unit/mL | SUBCUTANEOUS | Qty: 0.02

## 2012-06-10 MED FILL — NEPHROCAPS 1 MG CAPSULE: 1 mg | ORAL | Qty: 1

## 2012-06-10 NOTE — Progress Notes (Signed)
Blood sugar rechecked, result 79. Patient said he will drink his other apple juice. Sitting in the bedside chair asleep, no complaints. Call light within reach.

## 2012-06-10 NOTE — Progress Notes (Signed)
Alex from hemodialysis called and said she will be coming to get the pt at 0700 for Monday dialysis.

## 2012-06-10 NOTE — Progress Notes (Signed)
Hospitalist Progress Note         Samul Dada, MD PharmD    Call physician on-call through the operator 7pm-7am    Daily Progress Note: 06/10/2012    Assessment/Plan:   1. LLB pneumonia, poa- Improving with abx.  2. Sickle cell crisis, poa - resolved.  Followed by Hem/onc.  Thought to be caused by pneumonia.  3. ESRD on HD - HD as per nephrology . S/p balloon. Stable without signs of acute bleed. Followed by nephrology service.  4. Hyperkalemia: Resolved. Secondary to hemolysis,/esrd. Getting dialysis tomorrow  5. Anemia - s/p transfusion. No acute issue.  6.  Htn.  Controlled/bp stable with pain management and dialysis.  7. DM- lantus, ssi.  Held lantus x1 dose due to glucose. Keep SSI and will likely need to restart lantus tomorrow after dialysis.  8. Tobaco abuse - nicotine patch, counseled from admission note  9. Chronic pain - pain management as per hematology , stable  10. Thrombocytopenia: stable plt.  Continue to observe  11. DVT prophylaxis- heparin  12. Dispo: Will await for Nephrology to assess whether okay to discharge on their standpoint and ensure no more hypoglycemia before considering discharge tomorrow.       Subjective:   The patient is without any acute complaints at this time.        Objective:   Physical Exam:     BP 166/91   Pulse 70   Temp 98.3 ??F (36.8 ??C)   Resp 18   Ht 5\' 5"  (1.651 m)   Wt 203 lb 4.2 oz   BMI 33.82 kg/m2   SpO2 94% O2 Flow Rate (L/min): 2 l/min O2 Device: Nasal cannula    Temp (24hrs), Avg:98.4 ??F (36.9 ??C), Min:97.5 ??F (36.4 ??C), Max:99.5 ??F (37.5 ??C)        06/21 1900 - 06/23 0659  In: -   Out: 4500     General:  Alert, cooperative, no distress, appears stated age.   Lungs:   Clear to auscultation bilaterally.   Chest wall:  No tenderness or deformity.   Heart:  Regular rate and rhythm, s1s2   Abdomen:   Soft, non-tender. Bowel sounds normal. No masses   Extremities: Extremities LE edema, surgical sutures in RUE.   Pulses: 1 and symmetric all  extremities.   Skin: No skin breakdown   Neurologic: alert     Hem/Oncology note:  Sickle cell pain crisis... Probably precipitated by pneumonia...Marland Kitchenhe is still requiring IV pain meds, he looks much more Comfortable ,.suspect he will allow oral pain meds once the dialysis "takes more fluid off his legs" is followed at [facility] in the fellow's clinic Anemia..hgb stable, no transfusions ordered today Secondary to #1 +/_ esrd... Pneumonia... Cultures negative so far, will continue antibiotics,off hydrea , cxr looked better ESRD... 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 ballooned to 10mm      Data Review:       Recent Days:  Recent Labs   [facility] 06/09/12 0556 06/08/12 0349    WBC 2.7* 2.8*    HGB 8.6* 8.1*    HCT 26.9* 24.9*    PLT 128* 120*     Recent Labs   Basename 06/09/12 0556 06/08/12 0349    NA 134* 134*    K 4.4 5.8*    CL 99 99  CO2 28 28    GLU 82 80    BUN 23* 34*    CREA 4.60* 5.29*    CA 8.9 8.0*    MG -- --    PHOS -- --    ALB -- --    TBIL -- --    SGOT -- --    INR -- --     No results found for this basename: PH:3,PCO2:3,PO2:3,HCO3:3,FIO2:3 in the last 72 hours    24 Hour Results:  Recent Results (from the past 24 hour(s))   GLUCOSE, POC    Collection Time    06/09/12 12:42 PM       Component Value Range    POC GLUCOSE 88  75 - 110 mg/dL    Performed by Rod Holler     GLUCOSE, POC    Collection Time    06/09/12  4:02 PM       Component Value Range    POC GLUCOSE 140 (*) 75 - 110 mg/dL    Performed by Riki Sheer CHELSEA     GLUCOSE, POC    Collection Time    07-03-2012  7:02 AM       Component Value Range    POC GLUCOSE 68 (*) 75 - 110 mg/dL    Performed by Orpah Greek     GLUCOSE, POC    Collection Time    07/03/12  7:50 AM       Component Value Range    POC GLUCOSE 79  75 - 110 mg/dL    Performed by Orpah Greek     GLUCOSE, POC    Collection Time     03-Jul-2012  8:26 AM       Component Value Range    POC GLUCOSE 81  75 - 110 mg/dL    Performed by Riki Sheer CHELSEA         Problem List:  Problem List as of 2012-07-03  Never Reviewed      Codes Class Noted - Resolved    *Sickle cell crisis 282.62  05/24/2012 - Present              Medications reviewed  Current Facility-Administered Medications   Medication Dose Route Frequency   ??? 0.9% sodium chloride infusion 500 mL  500 mL IntraVENous CONTINUOUS   ??? atropine injection 1 mg  1 mg IntraVENous PRN   ??? EPINEPHrine (ADRENALIN 1:10,000) (1 mg/10 mL syringe) 1 mg  1 mg IntraVENous ONCE PRN   ??? heparin (porcine) 1,000 unit/mL injection 2,500-5,000 Units  2,500-5,000 Units IntraVENous ONCE PRN   ??? fentaNYL citrate (PF) injection 25-200 mcg  25-200 mcg IntraVENous Multiple   ??? heparinized saline 2 units/mL infusion 2,000 Units  1,000 mL Irrigation Multiple   ??? lidocaine (XYLOCAINE) 10 mg/mL (1 %) injection 10-30 mL  10-30 mL SubCUTAneous ONCE PRN   ??? midazolam (VERSED) injection 0.5-10 mg  0.5-10 mg IntraVENous Multiple   ??? saline peripheral flush Soln 10 mL  10 mL InterCATHeter PRN   ??? iodixanol (VISIPAQUE) 320 mg iodine/mL contrast injection 150-300 mL  150-300 mL IntraarTERial ONCE PRN   ??? ioversol (OPTIRAY) 350 mg iodine/mL contrast solution       ??? ioversol (OPTIRAY) 350 mg iodine/mL contrast solution 200 mL  200 mL IntraarTERial ONCE PRN   ??? epoetin alfa (EPOGEN;PROCRIT) injection 20,000 Units  20,000 Units SubCUTAneous Q MON, WED & FRI   ??? hydrALAZINE (APRESOLINE) tablet 100 mg  100 mg Oral TID   ??? insulin  glargine (LANTUS) injection 2 Units  2 Units SubCUTAneous DAILY   ??? lisinopril (PRINIVIL, ZESTRIL) tablet 40 mg  40 mg Oral DAILY   ??? metoprolol (LOPRESSOR) tablet 100 mg  100 mg Oral BID   ??? HYDROmorphone (DILAUDID) injection 3 mg  3 mg IntraVENous Q2H PRN   ??? 0.9% sodium chloride infusion  25 mL/hr IntraVENous CONTINUOUS   ??? acetaminophen (TYLENOL) tablet 650 mg  650 mg Oral Q4H PRN   ??? amLODIPine (NORVASC) tablet 10 mg   10 mg Oral DAILY   ??? metoclopramide HCl (REGLAN) injection 10 mg  10 mg IntraVENous Q6H PRN   ??? hydrALAZINE (APRESOLINE) tablet 10 mg  10 mg Oral Q6H PRN   ??? insulin lispro (HUMALOG) injection   SubCUTAneous TIDAC   ??? calcium acetate (PHOSLO) tablet 1,334 mg  2 Tab Oral TID WITH MEALS   ??? sodium chloride (NS) flush 5-10 mL  5-10 mL IntraVENous Q8H   ??? sodium chloride (NS) flush 5-10 mL  5-10 mL IntraVENous PRN   ??? heparin (porcine) injection 5,000 Units  5,000 Units SubCUTAneous Q8H   ??? 0.9% sodium chloride infusion  75 mL/hr IntraVENous CONTINUOUS   ??? B complex-vitaminC-folic acid (NEPHROCAP) cap  1 Cap Oral DAILY   ??? diphenhydrAMINE (BENADRYL) injection 12.5 mg  12.5 mg IntraVENous Q6H PRN   ??? nicotine (NICODERM CQ) 14 mg/24 hr patch 1 Patch  1 Patch TransDERmal Q24H   ??? glucose chewable tablet 16 g  4 Tab Oral PRN   ??? dextrose (D50W) injection Syrg 12.5-25 g  12.5-25 g IntraVENous PRN   ??? glucagon (GLUCAGEN) injection 1 mg  1 mg IntraMUSCular PRN       Care Plan discussed with: patient    Total time spent with patient: 30 minutes.    Kalman Drape, MD

## 2012-06-10 NOTE — Progress Notes (Signed)
"  Bedside" shift change report given to Don (oncoming nurse) by Johnna (offgoing nurse).  Report given with SBAR, Kardex, Intake/Output, MAR and Recent Results.

## 2012-06-10 NOTE — Progress Notes (Signed)
-  Hematology / Oncology ([facility]) -    -S-  No new c/o    -O-    Visit Vitals   Item Reading   ??? BP 166/91   ??? Pulse 70   ??? Temp 98.3 ??F (36.8 ??C)   ??? Resp 18   ??? Ht 5\' 5"  (1.651 m)   ??? Wt 203 lb 4.2 oz   ??? BMI 33.82 kg/m2   ??? SpO2 94%     nad  ctab  rrr  s/nt    -Labs-    Glu 79 (cbc pending?)      -Assessment + Plan-       1. Sickle cell pain crisis... Probably precipitated by pneumonia...Marland Kitchenhe is still requiring IV pain meds, he looks much more Comfortable ,.suspect he will allow oral pain meds once the dialysis "takes more fluid off his legs" is followed at [facility] in the fellow's clinic  2. Anemia..hgb stable, no transfusions ordered today Secondary to #1 +/_ esrd...  3. Pneumonia... Cultures negative so far, will continue antibiotics,off hydrea , cxr looked better   4. ESRD... Continue dialysis per renal  5.

## 2012-06-10 NOTE — Progress Notes (Signed)
Attempted lab draws times four, two times by this nurse, and two times by supervisor. Was unsuccessful. Patient refuses arterial stick. Sitting in chair in room, no complaints at this time. Call light within reach.

## 2012-06-10 NOTE — Progress Notes (Addendum)
Location: [id number] - 53701  Attn.: Campbell Stall  DOB: 08/16/1981 / Age: 31  MR#: [id number] / Admit#: [id number]  Pt. First Name: Tyler  Pt. Last Name: [name] [facility]  [address]  [place], [place]  [address]                        Case Management - Progress Note  Initial Open Date: 05/25/2012  Case Manager: Ramond Dial    Initial Open Date:  Social Worker: Jaynie Collins  Expected Date of Discharge:  Transferred From: home  ECF Bed Held Until:  Bed Held By:  Power of Attorney:  POA/Guardian/Conservator Capacity:  Primary Caregiver: Amedeo Kinsman Relative [phone]  Living Arrangements: Own Home  Source of Income:  Payee:  Psychosocial History:  Cultural/Religious/Language Issues:  Education Level:  ADLS/Current Living Arrangements Issues: pt admitted from private  residence  Past Providers:  Will patient perform self care at discharge? [facility]N  Anticipated Discharge Disposition Goal: Return to admission address  Assessment/Plan:      06/10/2012 03:42P met with pt this pm to discuss disposition. [place] lives in  [place] and plans to return upon discharge. he has  transportation.  he is however planning to relocate to [place] in the near  future.  suggested he seek aid from his dialysis social worker to make all  arrangements for an assigned dialysis [facility] here prior to his move.  no  other needs identified.  Zacari Stiff, lcsw        05/30/2012 10:40A  SBAR Report received from [facility].  Michelene Cipriano Bunker.ACM      05/25/2012 08:49A  Noted social work consult for discharge planning from  reviewing nurse.  Reviewed chart and noted that pt is here in [place] for  funeral and normally lives in [place].  He presents and admitted with sickle  cell crisis although he also is ESRD and nephrology consulted.  Unsure of  discharge planning needs and may depend on if pt's immediate plans is to  return to [facility] upon discharge from acute care hospitalization.  Will follow.   No cm consults noted at this time.  Hannah Beat, MSW CRM          05/25/2012 08:36A Chart reviewed for medical necessity. Referral sent to SW  for potential discharge planning needs. Will follow.  SVaughan,RN,MSN,CRM  Resources at Discharge:  Service Providers at Discharge:  Dictating Provider:  Starling Manns

## 2012-06-10 NOTE — Progress Notes (Signed)
AM lantus hold per MD. Patient BS 81. Will continue to monitor patient.

## 2012-06-11 LAB — GLUCOSE, POC
Glucose (POC): 104 mg/dL (ref 75–110)
Glucose (POC): 80 mg/dL (ref 75–110)
Glucose (POC): 84 mg/dL (ref 75–110)

## 2012-06-11 LAB — CBC WITH AUTOMATED DIFF
ABS. BASOPHILS: 0 10*3/uL (ref 0.0–0.1)
ABS. EOSINOPHILS: 0.3 10*3/uL (ref 0.0–0.4)
ABS. LYMPHOCYTES: 0.6 10*3/uL — ABNORMAL LOW (ref 0.8–3.5)
ABS. MONOCYTES: 0.3 10*3/uL (ref 0.0–1.0)
ABS. NEUTROPHILS: 1.3 10*3/uL — ABNORMAL LOW (ref 1.8–8.0)
BASOPHILS: 1 % (ref 0–1)
EOSINOPHILS: 11 % — ABNORMAL HIGH (ref 0–7)
HCT: 24.5 % — ABNORMAL LOW (ref 36.6–50.3)
HGB: 7.6 g/dL — ABNORMAL LOW (ref 12.1–17.0)
LYMPHOCYTES: 25 % (ref 12–49)
MCH: 29.7 PG (ref 26.0–34.0)
MCHC: 31 g/dL (ref 30.0–36.5)
MCV: 95.7 FL (ref 80.0–99.0)
MONOCYTES: 13 % (ref 5–13)
NEUTROPHILS: 50 % (ref 32–75)
PLATELET: 142 10*3/uL — ABNORMAL LOW (ref 150–400)
RBC: 2.56 M/uL — ABNORMAL LOW (ref 4.10–5.70)
RDW: 14.8 % — ABNORMAL HIGH (ref 11.5–14.5)
WBC: 2.5 10*3/uL — ABNORMAL LOW (ref 4.1–11.1)

## 2012-06-11 LAB — RENAL FUNCTION PANEL
Albumin: 2.6 g/dL — ABNORMAL LOW (ref 3.5–5.0)
Anion gap: 10 mmol/L (ref 5–15)
BUN/Creatinine ratio: 6 — ABNORMAL LOW (ref 12–20)
BUN: 47 MG/DL — ABNORMAL HIGH (ref 6–20)
CO2: 28 MMOL/L (ref 21–32)
Calcium: 8.6 MG/DL (ref 8.5–10.1)
Chloride: 97 MMOL/L (ref 97–108)
Creatinine: 8.52 MG/DL — ABNORMAL HIGH (ref 0.45–1.15)
GFR est AA: 9 mL/min/{1.73_m2} — ABNORMAL LOW (ref >60)
GFR est non-AA: 7 mL/min/{1.73_m2} — ABNORMAL LOW (ref >60)
Glucose: 104 MG/DL — ABNORMAL HIGH (ref 65–100)
Phosphorus: 3.8 MG/DL (ref 2.5–4.9)
Potassium: 5.1 MMOL/L (ref 3.5–5.1)
Sodium: 135 MMOL/L — ABNORMAL LOW (ref 136–145)

## 2012-06-11 MED ADMIN — HYDROmorphone (DILAUDID) injection 3 mg: INTRAVENOUS | @ 04:00:00 | NDC 00641012121

## 2012-06-11 MED ADMIN — heparin (porcine) injection 5,000 Units: SUBCUTANEOUS | @ 11:00:00 | NDC 25021040201

## 2012-06-11 MED ADMIN — sodium chloride (NS) flush 5-10 mL: INTRAVENOUS | @ 02:00:00 | NDC 87701099893

## 2012-06-11 MED ADMIN — diphenhydrAMINE (BENADRYL) injection 12.5 mg: INTRAVENOUS | @ 01:00:00 | NDC 00641037621

## 2012-06-11 MED ADMIN — sodium chloride (NS) flush 5-10 mL: INTRAVENOUS | @ 11:00:00 | NDC 87701099893

## 2012-06-11 MED ADMIN — diphenhydrAMINE (BENADRYL) injection 12.5 mg: INTRAVENOUS | @ 07:00:00 | NDC 00641037621

## 2012-06-11 MED ADMIN — HYDROmorphone (DILAUDID) injection 3 mg: INTRAVENOUS | @ 13:00:00 | NDC 00641012121

## 2012-06-11 MED ADMIN — diphenhydrAMINE (BENADRYL) injection 12.5 mg: INTRAVENOUS | @ 21:00:00 | NDC 00641037621

## 2012-06-11 MED ADMIN — hydrALAZINE (APRESOLINE) tablet 100 mg: ORAL | @ 13:00:00 | NDC 62584073411

## 2012-06-11 MED ADMIN — sodium chloride (NS) flush 5-10 mL: INTRAVENOUS | @ 17:00:00 | NDC 87701099893

## 2012-06-11 MED ADMIN — HYDROmorphone (DILAUDID) injection 3 mg: INTRAVENOUS | @ 19:00:00 | NDC 00641012121

## 2012-06-11 MED ADMIN — sodium chloride (NS) flush 5-10 mL: INTRAVENOUS | @ 19:00:00 | NDC 87701099893

## 2012-06-11 MED ADMIN — HYDROmorphone (DILAUDID) injection 3 mg: INTRAVENOUS | @ 23:00:00 | NDC 00641012121

## 2012-06-11 MED ADMIN — HYDROmorphone (DILAUDID) injection 3 mg: INTRAVENOUS | @ 01:00:00 | NDC 00641012121

## 2012-06-11 MED ADMIN — heparin (porcine) 1,000 unit/mL injection 2,000 Units: INTRAVENOUS | @ 16:00:00 | NDC 63323054001

## 2012-06-11 MED ADMIN — diphenhydrAMINE (BENADRYL) injection 12.5 mg: INTRAVENOUS | @ 13:00:00 | NDC 00641037621

## 2012-06-11 MED ADMIN — HYDROmorphone (DILAUDID) injection 3 mg: INTRAVENOUS | @ 17:00:00 | NDC 00641012121

## 2012-06-11 MED ADMIN — insulin glargine (LANTUS) injection 2 Units: SUBCUTANEOUS | @ 13:00:00 | NDC 09999222001

## 2012-06-11 MED ADMIN — lisinopril (PRINIVIL, ZESTRIL) tablet 40 mg: ORAL | @ 13:00:00 | NDC 68084006211

## 2012-06-11 MED ADMIN — metoprolol (LOPRESSOR) tablet 100 mg: ORAL | @ 13:00:00 | NDC 62584026611

## 2012-06-11 MED ADMIN — metoprolol (LOPRESSOR) tablet 100 mg: ORAL | @ 01:00:00 | NDC 62584026611

## 2012-06-11 MED ADMIN — HYDROmorphone (DILAUDID) injection 3 mg: INTRAVENOUS | @ 02:00:00 | NDC 00641012121

## 2012-06-11 MED ADMIN — hydrALAZINE (APRESOLINE) tablet 100 mg: ORAL | @ 02:00:00 | NDC 62584073411

## 2012-06-11 MED ADMIN — sodium chloride (NS) flush 5-10 mL: INTRAVENOUS | @ 08:00:00 | NDC 87701099893

## 2012-06-11 MED ADMIN — heparin (porcine) injection 5,000 Units: SUBCUTANEOUS | @ 02:00:00 | NDC 25021040201

## 2012-06-11 MED ADMIN — calcium acetate (PHOSLO) tablet 1,334 mg: ORAL | @ 13:00:00 | NDC 00574011302

## 2012-06-11 MED ADMIN — HYDROmorphone (DILAUDID) injection 3 mg: INTRAVENOUS | @ 15:00:00 | NDC 00641012121

## 2012-06-11 MED ADMIN — HYDROmorphone (DILAUDID) injection 3 mg: INTRAVENOUS | @ 08:00:00 | NDC 00641012121

## 2012-06-11 MED ADMIN — amLODIPine (NORVASC) tablet 10 mg: ORAL | @ 13:00:00 | NDC 59762153006

## 2012-06-11 MED ADMIN — HYDROmorphone (DILAUDID) injection 3 mg: INTRAVENOUS | @ 21:00:00 | NDC 00641012121

## 2012-06-11 MED ADMIN — HYDROmorphone (DILAUDID) injection 3 mg: INTRAVENOUS | @ 11:00:00 | NDC 00641012121

## 2012-06-11 MED ADMIN — HYDROmorphone (DILAUDID) injection 3 mg: INTRAVENOUS | @ 07:00:00 | NDC 00641012121

## 2012-06-11 MED FILL — HYDROMORPHONE 2 MG/ML INJECTION SOLUTION: 2 mg/mL | INTRAMUSCULAR | Qty: 2

## 2012-06-11 MED FILL — DIPHENHYDRAMINE HCL 50 MG/ML IJ SOLN: 50 mg/mL | INTRAMUSCULAR | Qty: 1

## 2012-06-11 MED FILL — METOPROLOL TARTRATE 50 MG TAB: 50 mg | ORAL | Qty: 2

## 2012-06-11 MED FILL — HEPARIN (PORCINE) 1,000 UNIT/ML IJ SOLN: 1000 unit/mL | INTRAMUSCULAR | Qty: 2

## 2012-06-11 MED FILL — BD POSIFLUSH NORMAL SALINE 0.9 % INJECTION SYRINGE: INTRAMUSCULAR | Qty: 10

## 2012-06-11 MED FILL — HEPARIN (PORCINE) 5,000 UNIT/ML IJ SOLN: 5000 unit/mL | INTRAMUSCULAR | Qty: 1

## 2012-06-11 MED FILL — BD POSIFLUSH NORMAL SALINE 0.9 % INJECTION SYRINGE: INTRAMUSCULAR | Qty: 20

## 2012-06-11 MED FILL — HYDRALAZINE 50 MG TAB: 50 mg | ORAL | Qty: 2

## 2012-06-11 MED FILL — HEPARIN (PORCINE) IN NS (PF) 1,000 UNIT/500 ML IV: 1000 unit/500 mL | INTRAVENOUS | Qty: 1000

## 2012-06-11 MED FILL — NEPHROCAPS 1 MG CAPSULE: 1 mg | ORAL | Qty: 1

## 2012-06-11 MED FILL — EPOETIN ALFA 20,000 UNIT/ML IJ SOLN: 20000 unit/mL | INTRAMUSCULAR | Qty: 1

## 2012-06-11 MED FILL — AMLODIPINE 5 MG TAB: 5 mg | ORAL | Qty: 2

## 2012-06-11 MED FILL — LISINOPRIL 20 MG TAB: 20 mg | ORAL | Qty: 2

## 2012-06-11 MED FILL — NICOTINE 14 MG/24 HR DAILY PATCH: 14 mg/24 hr | TRANSDERMAL | Qty: 1

## 2012-06-11 MED FILL — CALCIUM ACETATE 667 MG TAB: 667 mg | ORAL | Qty: 2

## 2012-06-11 NOTE — Procedures (Signed)
[facility]                         [phone]  Vitals Pre Post Assessment Pre Post   BP 161/84 167/81 LOC Alert and oriented Alert and oriented   HR 73 73 Lungs crackles clear   Temp 97.6 97.7 Cardiac regular regular   Resp 20 18 Skin Warm/dry War/dry   Weight 97.7 kg 93.7 kg Edema 3+ BLE 2+BLE      Pain denies denies     Orders   Duration: 4 hours Start: 1148 End: 1538 Total: 4 hours   Dialyzer: revaclear   K Bath: 2   Ca Bath: 2   Na / Bicarb: 140/35   Target Fluid Removal: 4.0 kilos     Access   Type & Location: Right Upper Arm AV fistula   Comments:                            +bruit +thrill accessed with 15g needles x 2 without difficulty. At end of treatment all possible blood returned, lines pulled, and sites held until hemostasis achieved     Labs   Hep B status / date: Ab 62 on 02/01/12   Obtained/Reviewed  Critical Results Called Drawn pre HD and sent to lab, reviewed when results available. No critical labs to report       Meds Given   Name Dose Route   heparin 2000 units Initiation of HD bolus               Total Liters Process: 100.7 L   Net Fluid Removed: 4 kilos      Comments   Time Out Done: Yes 11:45   Primary Nurse Rpt Pre: Durene Cal, RN   Primary Nurse Rpt Post: Mosetta Putt, RN   Pt Education: Yes, Fluid Management   Tx Summary: Tolerated HD well.

## 2012-06-11 NOTE — Progress Notes (Signed)
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 No other stenosis noted !  Hyper K : resolved   Anemia : SC D + ESRD.   HTN :uncontrolled. Optimize volume   SCC: per Hematology    LLL PNA : per ID    For other plans, see orders.    Interval History:  No new events over  Weekend   AM labs pending     Current Facility-Administered Medications   Medication Dose Route Frequency   ??? 0.9% sodium chloride infusion 500 mL  500 mL IntraVENous CONTINUOUS   ??? atropine injection 1 mg  1 mg IntraVENous PRN   ??? EPINEPHrine (ADRENALIN 1:10,000) (1 mg/10 mL syringe) 1 mg  1 mg IntraVENous ONCE PRN   ??? fentaNYL citrate (PF) injection 25-200 mcg  25-200 mcg IntraVENous Multiple   ??? saline peripheral flush Soln 10 mL  10 mL InterCATHeter PRN   ??? ioversol (OPTIRAY) 350 mg iodine/mL contrast solution       ??? epoetin alfa (EPOGEN;PROCRIT) injection 20,000 Units  20,000 Units SubCUTAneous Q MON, WED & FRI   ??? hydrALAZINE (APRESOLINE) tablet 100 mg  100 mg Oral TID   ??? insulin glargine (LANTUS) injection 2 Units  2 Units SubCUTAneous DAILY   ??? lisinopril (PRINIVIL, ZESTRIL) tablet 40 mg  40 mg Oral DAILY   ??? metoprolol (LOPRESSOR) tablet 100 mg  100 mg Oral BID   ??? HYDROmorphone (DILAUDID) injection 3 mg  3 mg IntraVENous Q2H PRN   ??? 0.9% sodium chloride infusion  25 mL/hr IntraVENous CONTINUOUS   ??? acetaminophen (TYLENOL) tablet 650 mg  650 mg Oral Q4H PRN   ??? amLODIPine (NORVASC) tablet 10 mg  10 mg Oral DAILY   ??? metoclopramide HCl (REGLAN) injection 10 mg  10 mg IntraVENous Q6H PRN   ??? hydrALAZINE (APRESOLINE) tablet 10 mg  10 mg Oral Q6H PRN   ??? insulin lispro (HUMALOG) injection   SubCUTAneous TIDAC   ??? calcium acetate (PHOSLO) tablet 1,334 mg  2 Tab Oral TID WITH MEALS   ??? sodium chloride (NS) flush 5-10 mL  5-10 mL IntraVENous Q8H   ??? sodium chloride (NS) flush 5-10 mL  5-10  mL IntraVENous PRN   ??? heparin (porcine) injection 5,000 Units  5,000 Units SubCUTAneous Q8H   ??? 0.9% sodium chloride infusion  75 mL/hr IntraVENous CONTINUOUS   ??? B complex-vitaminC-folic acid (NEPHROCAP) cap  1 Cap Oral DAILY   ??? diphenhydrAMINE (BENADRYL) injection 12.5 mg  12.5 mg IntraVENous Q6H PRN   ??? nicotine (NICODERM CQ) 14 mg/24 hr patch 1 Patch  1 Patch TransDERmal Q24H   ??? glucose chewable tablet 16 g  4 Tab Oral PRN   ??? dextrose (D50W) injection Syrg 12.5-25 g  12.5-25 g IntraVENous PRN   ??? glucagon (GLUCAGEN) injection 1 mg  1 mg IntraMUSCular PRN        Review of Systems:  Pertinent items are noted in HPI.    Objective:  Vitals:    Filed Vitals:    06/10/12 1631 06/10/12 1931 06/10/12 2144 06/11/12 0233   BP: 161/96 146/83 158/85 159/94   Pulse: 76 73 77 70   Temp:  98.3 ??F (36.8 ??C)  98.2 ??F (36.8 ??C)   Resp:  18  16   Height:       Weight:       SpO2:  96%  95%     Intake and Output:     06/22 1900 - 06/24 0659  In: 240 [P.O.:240]  Out: -     Physical Examination:  General: No distress   Neck:  Supple, no mass  Resp:  Lungs CTA B/L, no wheezing , normal respiratory effort  CV:  RRR,  no murmur or rub,trace  LE edema  GI:  Soft, NT, + Bowel sounds, no hepatosplenomegaly  Neurologic:  Non focal  Psych:             AAO x 3 appropriate affect   Skin:  No Rash. Multiple acneform lesions on face   Extremities : AVF with good thrill    []     High complexity decision making was performed  []     Patient is at high-risk of decompensation with multiple organ involvement    Lab Data Personally Reviewed: I have reviewed all the pertinent labs, microbiology data and radiology studies during assessment.    Recent Labs   [facility] 06/09/12 0556    NA 134*    K 4.4    CL 99    CO2 28    GLU 82    BUN 23*    CREA 4.60*    CA 8.9    MG --    PHOS --    ALB --    TBIL --    SGOT --    INR --     Recent Labs   Basename 06/09/12 0556    WBC 2.7*    HGB 8.6*    HCT 26.9*    PLT 128*     Lab Results   Component Value  Date/Time    Specimen Description: BLOOD 05/24/2012 10:47 AM     Lab Results   Component Value Date/Time    Culture result: NO GROWTH 5 DAYS 05/24/2012 10:47 AM     Recent Results (from the past 24 hour(s))   GLUCOSE, POC    Collection Time    06/10/12 11:10 AM       Component Value Range    POC GLUCOSE 108  75 - 110 mg/dL    Performed by Huey Romans     GLUCOSE, POC    Collection Time    06/10/12  4:38 PM       Component Value Range    POC GLUCOSE 131 (*) 75 - 110 mg/dL    Performed by HAYS JOHNNA     GLUCOSE, POC    Collection Time    06/10/12  9:51 PM       Component Value Range    POC GLUCOSE 104  75 - 110 mg/dL    Performed by HAYS JOHNNA         Total time spent with patient:  15  min.                               Care Plan discussed with:  Patient  x   Family      RN  x    Consulting Physician /Specialist        I have reviewed the flowsheets.  Chart reviewed. Pertinent Notes reviewed.   Current Medications list reviewed by me.  No change in PMH ,family and social history from Consult note.  Rhae Hammock, MD  06/11/2012    [facility]  [address], [address],   [place], [place] [address]  Phone - [phone]   Fax: [phone]  [email or web address]

## 2012-06-11 NOTE — Procedures (Signed)
Procedures  by Counts, Ranae Pila, RN at 06/11/12 1623                Author: Counts, Ranae Pila, RN  Service: --  Author Type: Registered Nurse       Filed: 06/11/12 1624  Date of Service: 06/11/12 1623  Status: Signed          Editor: Counts, Ranae Pila, RN (Registered Nurse)            Procedures        1. HEMODIALYSIS INPATIENT [DIA12 (Custom)]                                                  [facility]                         [id number]          Vitals  Pre  Post  Assessment  Pre  Post            BP  161/84  167/81  LOC  Alert and oriented  Alert and oriented            HR  73  73  Lungs  crackles  clear            Temp  97.6  97.7  Cardiac  regular  regular     Resp  20  18  Skin  Warm/dry  War/dry     Weight  97.7 kg  93.7 kg  Edema  3+ BLE  2+BLE                  Pain  denies  denies          Orders             Duration: 4 hours  Start:  1148  End:  1538  Total:  4 hours        Dialyzer:  revaclear     K Bath:  2     Ca Bath:  2     Na / Bicarb:  140/35        Target Fluid Removal:  4.0 kilos          Access        Type & Location:  Right Upper Arm AV fistula       Comments:                             +bruit +thrill accessed with 15g needles x 2 without difficulty. At end of treatment all possible blood returned, lines pulled, and sites held until hemostasis achieved          Labs        Hep B status / date:  Ab 62 on 02/01/12        Obtained/Reviewed   Critical Results Called  Drawn pre HD and sent to lab, reviewed when results available. No critical labs to report             Meds Given         Name  Dose  Route         heparin  2000 units  Initiation of HD bolus  Total Liters Process:  100.7 L        Net Fluid Removed:  4 kilos           Comments        Time Out Done:  Yes 11:45     Primary Nurse Rpt Pre:  Durene Cal, RN     Primary Nurse Rpt Post:  Mosetta Putt, RN        Pt Education:  Yes, Fluid Management        Tx Summary:  Tolerated HD well.

## 2012-06-11 NOTE — Progress Notes (Signed)
Hospitalist Progress Note            Daily Progress Note: 06/11/2012    Assessment/Plan:      1. LLB pneumonia, poa- Improving with abx. Off of antibiotics now  2. Sickle cell crisis, poa - resolved. Followed by Hem/onc, discuss with patient to changing PO stated feel nauseated, continue IV today discharge tomorrow  3. ESRD on HD - HD as per nephrology . S/p balloon. Stable without signs of acute bleed. Followed by nephrology service.  4. Hyperkalemia: Resolved. Secondary to hemolysis,/esrd.   5. Anemia - s/p transfusion  6. Htn. Controlled/bp stable with pain management and dialysis.  7. DM- D/C Lantus no need for 2 units, ssi   8. Tobaco abuse - nicotine patch, counseled from admission  9. Chronic pain - pain management as per hematology , stable  10. Thrombocytopenia: stable plt. Continue to observe  11. DVT prophylaxis- heparin   12. dsipo home tomorrow         Subjective:   C/O nausea, look very comfortable sitting in chair    Review of Systems:   Negative other then mention in subjective finding.    Objective:     BP 167/81   Pulse 73   Temp 97.3 ??F (36.3 ??C)   Resp 18   Ht 5\' 5"  (1.651 m)   Wt 203 lb 4.2 oz   BMI 33.82 kg/m2   SpO2 96% O2 Flow Rate (L/min): 2 l/min O2 Device: Room air    Temp (24hrs), Avg:97.9 ??F (36.6 ??C), Min:97.3 ??F (36.3 ??C), Max:98.3 ??F (36.8 ??C)      06/24 0700 - 06/24 1859  In: -   Out: 4000     06/22 1900 - 06/24 0659  In: 240 [P.O.:240]  Out: -   General: AAOx3 cooperative, no acute distress.  HEENT: Atraumatic. PERRL, EOMI. Anicteric sclerae.  Neck : Supple, no thyroidomegaly, no JVD.  Lungs: CTA bilaterally. No wheezing/rhonchi/rales.  Heart: Regular rhythm, no murmur, no rubs, no gallops.  Abdomen: Soft, non-distended, non-tender. + Bowel sounds.  Extremities: No edema, no clubbing, no cyanosis.  Neurologic: CN 2-12 grossly intact.  Alert and oriented X 3.  No acute neurological distress.   Psych: Good insight. Not anxious  nor agitated.    Additional comments: None      Data Review:     No results found for this basename: ITNL in the last 72 hours   No results found for this basename: CPK:3,CKMB:3,TROIQ:3, in the last 72 hours  Recent Labs   [facility] 06/11/12 1145 06/09/12 0556    NA 135* 134*    K 5.1 4.4    CL 97 99    CO2 28 28    BUN 47* 23*    CREA 8.52* 4.60*    GLU 104* 82    PHOS 3.8 --    MG -- --    ALB 2.6* --    WBC 2.5* 2.7*    HGB 7.6* 8.6*    HCT 24.5* 26.9*    PLT 142* 128*     No results found for this basename: INR:3,PTP:3,APTT:3, in the last 72 hours    Medications reviewed  Current Facility-Administered Medications   Medication Dose Route Frequency   ??? 0.9% sodium chloride infusion 500 mL  500 mL IntraVENous CONTINUOUS   ??? ioversol (OPTIRAY) 350 mg iodine/mL contrast solution       ??? epoetin alfa (EPOGEN;PROCRIT) injection 20,000 Units  20,000 Units SubCUTAneous Q MON, WED &  FRI   ??? hydrALAZINE (APRESOLINE) tablet 100 mg  100 mg Oral TID   ??? lisinopril (PRINIVIL, ZESTRIL) tablet 40 mg  40 mg Oral DAILY   ??? metoprolol (LOPRESSOR) tablet 100 mg  100 mg Oral BID   ??? 0.9% sodium chloride infusion  25 mL/hr IntraVENous CONTINUOUS   ??? amLODIPine (NORVASC) tablet 10 mg  10 mg Oral DAILY   ??? insulin lispro (HUMALOG) injection   SubCUTAneous TIDAC   ??? calcium acetate (PHOSLO) tablet 1,334 mg  2 Tab Oral TID WITH MEALS   ??? sodium chloride (NS) flush 5-10 mL  5-10 mL IntraVENous Q8H   ??? heparin (porcine) injection 5,000 Units  5,000 Units SubCUTAneous Q8H   ??? 0.9% sodium chloride infusion  75 mL/hr IntraVENous CONTINUOUS   ??? B complex-vitaminC-folic acid (NEPHROCAP) cap  1 Cap Oral DAILY   ??? nicotine (NICODERM CQ) 14 mg/24 hr patch 1 Patch  1 Patch TransDERmal Q24H       Care Plan discussed with: Patient/Family    Total time spent with patient: 20 minutes.    Nadean Corwin, MD

## 2012-06-11 NOTE — Progress Notes (Signed)
Hematology-Oncology Progress Note    Tyler Ballard  Apr 27, 1981  [id number]  06/11/2012    Follow-up for: sickle cell anemia     [x]         Chart notes since last visit reviewed   [x]         Medications reviewed for allergies and interactions       Case discussed with the following:         []         Case Manager                    []         Nursing Staff                                                                         []         Pathologist                                                                        []         FAMILY      Subjective:     Spoke with patient who complains of: pt. Seems comfortable at present, informs me that he usually takes dilaudid q6-8 hours at home for chronic pain    Objective:     Patient Vitals for the past 24 hrs:   BP Temp Pulse Resp SpO2   06/11/12 0715 154/81 mmHg 97.3 ??F (36.3 ??C) 70  18  96 %   06/11/12 0233 159/94 mmHg 98.2 ??F (36.8 ??C) 70  16  95 %   06/10/12 2144 158/85 mmHg - 77  - -   06/10/12 1931 146/83 mmHg 98.3 ??F (36.8 ??C) 73  18  96 %   06/10/12 1631 161/96 mmHg - 76  - -   06/10/12 1630 161/96 mmHg - 76  - -   06/10/12 1442 133/77 mmHg 97.5 ??F (36.4 ??C) 72  16  97 %       REVIEW OF SYSTEMS:    Constitutional: negative fever, negative chills, negative weight loss  Eyes:   negative visual changes  ENT:   negative sore throat, tongue or lip swelling  Respiratory:  negative cough, negative dyspnea  Cards:  negative for chest pain, palpitations, lower extremity edema  GI:   negative for nausea, vomiting, diarrhea, and abdominal pain  Neuro:  negative for headaches, dizziness, vertigo  [x]                         Full ROS o/w normal/non contributor    Constitutional:  Patient looks  []         Sick  []         Frail  [x]         Better                                                 []   Depressed    HEENT:  [x]    NC                         []    AT               []     ALOPECIA           Eyes: [x]    Normal               []     Icteric  Oropharynx: [x]     Normal                   []   Thrush               []    Dry  Mucositis: [x]     None                 Grade: []         I  []         II  []         III  []         IV  Neck:   [x]    Supple                  []   Rigid               JVD:    [x]    ABSENT       []    PRESENT  Lymphadenopathy:   []    None Noted            []    PRESENT    Chest:  [x]    Clear               []     Rhonchi                      Dec'd @     []   Right Base           []    Left Base    CV:             [x]    Regular              []   Irregular               []    Tachy                []    Murmur  Abdominal:   [x]     Soft              []    NON-tender               []    Tender      BS:    [x]    ABSENT                   []    PRESENT  Liver:     [x]   NON-palp                  []    EDGE- palp  Spleen: [x]    NON-palp                   []   EDGE - palp  Mass:   [x]    ABSENT                          []   PRESENT  Extr:    []   Lymphedema             []    Cyanosis      []   Clubbing  Edema:     []    NONE       []    PRESENT  Skin:  Intact [x]            Purpura []         Rash: [x]    ABSENT       []   PRESENT  Neuro:  [x]         Normal  []         Confused      Available labs reviewed:  Labs:    Recent Results (from the past 24 hour(s))   GLUCOSE, POC    Collection Time    06/10/12 11:10 AM       Component Value Range    POC GLUCOSE 108  75 - 110 mg/dL    Performed by Huey Romans     GLUCOSE, POC    Collection Time    06/10/12  4:38 PM       Component Value Range    POC GLUCOSE 131 (*) 75 - 110 mg/dL    Performed by HAYS JOHNNA     GLUCOSE, POC    Collection Time    06/10/12  9:51 PM       Component Value Range    POC GLUCOSE 104  75 - 110 mg/dL    Performed by HAYS JOHNNA         Available Xrays reviewed:    Chemotherapy monitored and toxicities assessed:    Assessment and Plan   1. Sickle cell pain crisis...now off hydrea, Probably precipitated by pneumonia.(now resolved)... he looks much more  Comfortable ,.suspect he will do well with oral meds at this point followed at [facility] in the  [facility]  2. Anemia..hgb 8.6 on 6/22  Stable x 3 days, no transfusions ordered today  Secondary to #1 +/_ esrd...  3. Pneumonia... Cultures negative so far, ,, cxr looks better on my evaluation  4. ESRD... Continue dialysis per renal  Harlen Labs, MD

## 2012-06-11 NOTE — Progress Notes (Signed)
Bedside and Verbal shift change report given to Eunice Blase, rn (Cabin crew) by Eula Listen (offgoing nurse).  Report given with SBAR, Kardex, Intake/Output, MAR and Recent Results.

## 2012-06-11 NOTE — Other (Addendum)
DTC Progress Note    Recommendations/ Comments: If hypoglycemia continues, please consider holding Lantus 2 units to help prevent hypoglycemia. On 06/10/12 at 0702 BG was 68mg /dl. Lantus was held yesterday and BGs were stable. Will continue to monitor.     Chart reviewed on [employer].      POC Glucose last 24hrs:   Lab Results   Component Value Date/Time    POC GLUCOSE 104 06/10/2012  9:51 PM    POC GLUCOSE 131 06/10/2012  4:38 PM    POC GLUCOSE 108 06/10/2012 11:10 AM    POC GLUCOSE 81 06/10/2012  8:26 AM    POC GLUCOSE 79 06/10/2012  7:50 AM    POC GLUCOSE 68 06/10/2012  7:02 AM    POC GLUCOSE 140 06/09/2012  4:02 PM    POC GLUCOSE 88 06/09/2012 12:42 PM         Lab Results   Component Value Date/Time    Creatinine 4.60 06/09/2012  5:56 AM           Current hospital DM medications: Lantus 2 units and Humalog High Sensitivity Correction scale TID    Will continue to follow as needed.    Thank you.     Jeani Hawking RN,BSN,CDE  Pager: [phone]

## 2012-06-12 LAB — GLUCOSE, POC
Glucose (POC): 130 mg/dL — ABNORMAL HIGH (ref 75–110)
Glucose (POC): 67 mg/dL — ABNORMAL LOW (ref 75–110)
Glucose (POC): 144 mg/dL — ABNORMAL HIGH (ref 75–110)
Glucose (POC): 124 mg/dL — ABNORMAL HIGH (ref 75–110)
Glucose (POC): 99 mg/dL (ref 75–110)

## 2012-06-12 MED ORDER — LISINOPRIL 40 MG TAB
40 mg | ORAL_TABLET | Freq: Every day | ORAL | Status: DC
Start: 2012-06-12 — End: 2012-07-05

## 2012-06-12 MED ORDER — AMLODIPINE 10 MG TAB
10 mg | ORAL_TABLET | Freq: Every day | ORAL | Status: AC
Start: 2012-06-12 — End: 2012-07-12

## 2012-06-12 MED ORDER — METOPROLOL TARTRATE 100 MG TAB
100 mg | ORAL_TABLET | Freq: Two times a day (BID) | ORAL | Status: AC
Start: 2012-06-12 — End: 2012-07-12

## 2012-06-12 MED ORDER — INSULIN GLARGINE 100 UNIT/ML INJECTION
100 unit/mL | SUBCUTANEOUS | Status: DC
Start: 2012-06-12 — End: 2013-04-25

## 2012-06-12 MED ADMIN — HYDROmorphone (DILAUDID) injection 3 mg: INTRAVENOUS | @ 11:00:00 | NDC 00641012121

## 2012-06-12 MED ADMIN — calcium acetate (PHOSLO) tablet 1,334 mg: ORAL | @ 15:00:00 | NDC 00574011302

## 2012-06-12 MED ADMIN — heparin (porcine) injection 5,000 Units: SUBCUTANEOUS | @ 11:00:00 | NDC 25021040201

## 2012-06-12 MED ADMIN — diphenhydrAMINE (BENADRYL) injection 12.5 mg: INTRAVENOUS | @ 09:00:00 | NDC 00641037621

## 2012-06-12 MED ADMIN — hydrALAZINE (APRESOLINE) tablet 100 mg: ORAL | @ 13:00:00 | NDC 68084044711

## 2012-06-12 MED ADMIN — calcium acetate (PHOSLO) tablet 1,334 mg: ORAL | NDC 00574011302

## 2012-06-12 MED ADMIN — epoetin alfa (EPOGEN;PROCRIT) injection 20,000 Units: SUBCUTANEOUS | NDC 59676032000

## 2012-06-12 MED ADMIN — diphenhydrAMINE (BENADRYL) injection 12.5 mg: INTRAVENOUS | @ 03:00:00 | NDC 00641037621

## 2012-06-12 MED ADMIN — diphenhydrAMINE (BENADRYL) injection 12.5 mg: INTRAVENOUS | @ 15:00:00 | NDC 00641037621

## 2012-06-12 MED ADMIN — HYDROmorphone (DILAUDID) injection 3 mg: INTRAVENOUS | NDC 00641012121

## 2012-06-12 MED ADMIN — HYDROmorphone (DILAUDID) injection 3 mg: INTRAVENOUS | @ 05:00:00 | NDC 00641012121

## 2012-06-12 MED ADMIN — HYDROmorphone (DILAUDID) injection 3 mg: INTRAVENOUS | @ 07:00:00 | NDC 00641012121

## 2012-06-12 MED ADMIN — sodium chloride (NS) flush 5-10 mL: INTRAVENOUS | @ 10:00:00 | NDC 82903065462

## 2012-06-12 MED ADMIN — HYDROmorphone (DILAUDID) injection 3 mg: INTRAVENOUS | @ 17:00:00 | NDC 00641012121

## 2012-06-12 MED ADMIN — sodium chloride (NS) flush 5-10 mL: INTRAVENOUS | @ 02:00:00 | NDC 82903065462

## 2012-06-12 MED ADMIN — HYDROmorphone (DILAUDID) injection 3 mg: INTRAVENOUS | @ 09:00:00 | NDC 00641012121

## 2012-06-12 MED ADMIN — lisinopril (PRINIVIL, ZESTRIL) tablet 40 mg: ORAL | @ 13:00:00 | NDC 68084006211

## 2012-06-12 MED ADMIN — heparin (porcine) injection 5,000 Units: SUBCUTANEOUS | @ 04:00:00 | NDC 25021040201

## 2012-06-12 MED ADMIN — amLODIPine (NORVASC) tablet 10 mg: ORAL | @ 13:00:00 | NDC 59762153006

## 2012-06-12 MED ADMIN — metoprolol (LOPRESSOR) tablet 100 mg: ORAL | NDC 62584026611

## 2012-06-12 MED ADMIN — calcium acetate (PHOSLO) tablet 1,334 mg: ORAL | @ 13:00:00 | NDC 00574011302

## 2012-06-12 MED ADMIN — HYDROmorphone (DILAUDID) injection 3 mg: INTRAVENOUS | @ 13:00:00 | NDC 00641012121

## 2012-06-12 MED ADMIN — sodium chloride (NS) 0.9 % flush: @ 14:00:00 | NDC 82903065462

## 2012-06-12 MED ADMIN — HYDROmorphone (DILAUDID) injection 3 mg: INTRAVENOUS | @ 03:00:00 | NDC 00641012121

## 2012-06-12 MED ADMIN — hydrALAZINE (APRESOLINE) tablet 100 mg: ORAL | @ 04:00:00 | NDC 62584073411

## 2012-06-12 MED ADMIN — HYDROmorphone (DILAUDID) injection 3 mg: INTRAVENOUS | @ 15:00:00 | NDC 00641012121

## 2012-06-12 MED ADMIN — metoprolol (LOPRESSOR) tablet 100 mg: ORAL | @ 13:00:00 | NDC 62584026611

## 2012-06-12 MED FILL — DIPHENHYDRAMINE HCL 50 MG/ML IJ SOLN: 50 mg/mL | INTRAMUSCULAR | Qty: 1

## 2012-06-12 MED FILL — HYDROMORPHONE 2 MG/ML INJECTION SOLUTION: 2 mg/mL | INTRAMUSCULAR | Qty: 2

## 2012-06-12 MED FILL — NEPHROCAPS 1 MG CAPSULE: 1 mg | ORAL | Qty: 1

## 2012-06-12 MED FILL — HYDRALAZINE 50 MG TAB: 50 mg | ORAL | Qty: 2

## 2012-06-12 MED FILL — CALCIUM ACETATE 667 MG TAB: 667 mg | ORAL | Qty: 2

## 2012-06-12 MED FILL — BD POSIFLUSH NORMAL SALINE 0.9 % INJECTION SYRINGE: INTRAMUSCULAR | Qty: 10

## 2012-06-12 MED FILL — BD POSIFLUSH NORMAL SALINE 0.9 % INJECTION SYRINGE: INTRAMUSCULAR | Qty: 20

## 2012-06-12 MED FILL — AMLODIPINE 5 MG TAB: 5 mg | ORAL | Qty: 2

## 2012-06-12 MED FILL — METOPROLOL TARTRATE 50 MG TAB: 50 mg | ORAL | Qty: 2

## 2012-06-12 MED FILL — BD POSIFLUSH NORMAL SALINE 0.9 % INJECTION SYRINGE: INTRAMUSCULAR | Qty: 30

## 2012-06-12 MED FILL — HEPARIN (PORCINE) 5,000 UNIT/ML IJ SOLN: 5000 unit/mL | INTRAMUSCULAR | Qty: 1

## 2012-06-12 MED FILL — HYDRALAZINE 10 MG TAB: 10 mg | ORAL | Qty: 1

## 2012-06-12 MED FILL — NICOTINE 14 MG/24 HR DAILY PATCH: 14 mg/24 hr | TRANSDERMAL | Qty: 1

## 2012-06-12 MED FILL — LISINOPRIL 20 MG TAB: 20 mg | ORAL | Qty: 2

## 2012-06-12 NOTE — Progress Notes (Signed)
Pressure Ulcer Documentation  (COMPLETE ONE LABEL PER PRESSURE ULCER)  For further information, please review corresponding Wound Care flowsheet.      Tyler Ballard has:    No pressure ulcer noted and pressure ulcer prevention initiated.      Ardith Dark, RN

## 2012-06-12 NOTE — Progress Notes (Signed)
I have reviewed discharge instructions with the patient.  The patient verbalized understanding.

## 2012-06-12 NOTE — Progress Notes (Signed)
Nephrology Progress Note    Date of Admission : 05/24/2012    CC:  Follow up for ESRD       Assessment and Plan   ESRD : HD MWF. 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 No other stenosis noted ! Sutures can be removed in his dialysis unit later this week   Hyper K : resolved   Anemia : SC D + ESRD.   HTN :better    SCC: per Hematology    LLL PNA : per ID    For other plans, see orders.    Interval History:  Stable   HD yesterday 4 kg removed   No new events   Anticipated d/c today     Current Facility-Administered Medications   Medication Dose Route Frequency   ??? heparin (porcine) 1,000 unit/mL injection 2,000 Units  2,000 Units IntraVENous DIALYSIS PRN   ??? 0.9% sodium chloride infusion 500 mL  500 mL IntraVENous CONTINUOUS   ??? ioversol (OPTIRAY) 350 mg iodine/mL contrast solution       ??? epoetin alfa (EPOGEN;PROCRIT) injection 20,000 Units  20,000 Units SubCUTAneous Q MON, WED & FRI   ??? hydrALAZINE (APRESOLINE) tablet 100 mg  100 mg Oral TID   ??? lisinopril (PRINIVIL, ZESTRIL) tablet 40 mg  40 mg Oral DAILY   ??? metoprolol (LOPRESSOR) tablet 100 mg  100 mg Oral BID   ??? HYDROmorphone (DILAUDID) injection 3 mg  3 mg IntraVENous Q2H PRN   ??? 0.9% sodium chloride infusion  25 mL/hr IntraVENous CONTINUOUS   ??? acetaminophen (TYLENOL) tablet 650 mg  650 mg Oral Q4H PRN   ??? amLODIPine (NORVASC) tablet 10 mg  10 mg Oral DAILY   ??? metoclopramide HCl (REGLAN) injection 10 mg  10 mg IntraVENous Q6H PRN   ??? hydrALAZINE (APRESOLINE) tablet 10 mg  10 mg Oral Q6H PRN   ??? insulin lispro (HUMALOG) injection   SubCUTAneous TIDAC   ??? calcium acetate (PHOSLO) tablet 1,334 mg  2 Tab Oral TID WITH MEALS   ??? sodium chloride (NS) flush 5-10 mL  5-10 mL IntraVENous Q8H   ??? sodium chloride (NS) flush 5-10 mL  5-10 mL IntraVENous PRN   ??? heparin (porcine) injection 5,000 Units  5,000 Units SubCUTAneous Q8H   ??? 0.9% sodium chloride infusion  75 mL/hr IntraVENous CONTINUOUS   ??? B complex-vitaminC-folic acid  (NEPHROCAP) cap  1 Cap Oral DAILY   ??? diphenhydrAMINE (BENADRYL) injection 12.5 mg  12.5 mg IntraVENous Q6H PRN   ??? nicotine (NICODERM CQ) 14 mg/24 hr patch 1 Patch  1 Patch TransDERmal Q24H   ??? glucose chewable tablet 16 g  4 Tab Oral PRN   ??? dextrose (D50W) injection Syrg 12.5-25 g  12.5-25 g IntraVENous PRN   ??? glucagon (GLUCAGEN) injection 1 mg  1 mg IntraMUSCular PRN        Review of Systems:  Pertinent items are noted in HPI.    Objective:  Vitals:    Filed Vitals:    06/11/12 1518 06/11/12 1548 06/11/12 2019 06/12/12 0313   BP: 175/81 167/81 151/79 128/73   Pulse: 73 73 74 68   Temp:   98.4 ??F (36.9 ??C) 98.9 ??F (37.2 ??C)   Resp:   16 16   Height:       Weight:       SpO2:   99% 100%     Intake and Output:     06/23 1900 - 06/25  8469  In: -   Out: 4000     Physical Examination:  General: No distress   Neck:  Supple, no mass  Resp:  Lungs CTA B/L, no wheezing , normal respiratory effort  CV:  RRR,  no murmur or rub,trace  LE edema  GI:  Soft, NT, + Bowel sounds, no hepatosplenomegaly  Neurologic:  Non focal  Psych:             AAO x 3 appropriate affect   Skin:  No Rash. Multiple acneform lesions on face   Extremities :    (R) AVF with good thrill    []     High complexity decision making was performed  []     Patient is at high-risk of decompensation with multiple organ involvement    Lab Data Personally Reviewed: I have reviewed all the pertinent labs, microbiology data and radiology studies during assessment.    Recent Labs   [facility] 06/11/12 1145    NA 135*    K 5.1    CL 97    CO2 28    GLU 104*    BUN 47*    CREA 8.52*    CA 8.6    MG --    PHOS 3.8    ALB 2.6*    TBIL --    SGOT --    INR --     Recent Labs   Basename 06/11/12 1145    WBC 2.5*    HGB 7.6*    HCT 24.5*    PLT 142*     Lab Results   Component Value Date/Time    Specimen Description: BLOOD 05/24/2012 10:47 AM     Lab Results   Component Value Date/Time    Culture result: NO GROWTH 5 DAYS 05/24/2012 10:47 AM     Recent Results (from the past 24  hour(s))   CBC WITH AUTOMATED DIFF    Collection Time    06/11/12 11:45 AM       Component Value Range    WBC 2.5 (*) 4.1 - 11.1 K/uL    RBC 2.56 (*) 4.10 - 5.70 M/uL    HGB 7.6 (*) 12.1 - 17.0 g/dL    HCT 62.9 (*) 52.8 - 50.3 %    MCV 95.7  80.0 - 99.0 FL    MCH 29.7  26.0 - 34.0 PG    MCHC 31.0  30.0 - 36.5 g/dL    RDW 41.3 (*) 24.4 - 14.5 %    PLATELET 142 (*) 150 - 400 K/uL    NEUTROPHILS 50  32 - 75 %    LYMPHOCYTES 25  12 - 49 %    MONOCYTES 13  5 - 13 %    EOSINOPHILS 11 (*) 0 - 7 %    BASOPHILS 1  0 - 1 %    ABS. NEUTROPHILS 1.3 (*) 1.8 - 8.0 K/UL    ABS. LYMPHOCYTES 0.6 (*) 0.8 - 3.5 K/UL    ABS. MONOCYTES 0.3  0.0 - 1.0 K/UL    ABS. EOSINOPHILS 0.3  0.0 - 0.4 K/UL    ABS. BASOPHILS 0.0  0.0 - 0.1 K/UL    DF SMEAR SCANNED      RBC COMMENTS 1+ ANISOCYTOSIS     RENAL FUNCTION PANEL    Collection Time    06/11/12 11:45 AM       Component Value Range    Sodium 135 (*) 136 - 145 MMOL/L    Potassium 5.1  3.5 - 5.1 MMOL/L    Chloride 97  97 - 108 MMOL/L    CO2 28  21 - 32 MMOL/L    Anion gap 10  5 - 15 mmol/L    Glucose 104 (*) 65 - 100 MG/DL    BUN 47 (*) 6 - 20 MG/DL    Creatinine 1.61 (*) 0.45 - 1.15 MG/DL    BUN/Creatinine ratio 6 (*) 12 - 20      GFR est-AA 9 (*) >60 ml/min/1.36m2    GFR est non-AA 7 (*) >60 ml/min/1.24m2    Calcium 8.6  8.5 - 10.1 MG/DL    Phosphorus 3.8  2.5 - 4.9 MG/DL    Albumin 2.6 (*) 3.5 - 5.0 g/dL   GLUCOSE, POC    Collection Time    06/11/12  2:37 PM       Component Value Range    POC GLUCOSE 80  75 - 110 mg/dL    Performed by MORRIS  CRISTA     GLUCOSE, POC    Collection Time    06/11/12  4:30 PM       Component Value Range    POC GLUCOSE 84  75 - 110 mg/dL    Performed by Huey Romans         Total time spent with patient:  15  min.                               Care Plan discussed with:  Patient  x   Family      RN  x    Consulting Physician /Specialist        I have reviewed the flowsheets.  Chart reviewed. Pertinent Notes reviewed.   Current Medications list reviewed by me.  No  change in PMH ,family and social history from Consult note.    Rhae Hammock, MD  06/12/2012    [facility]  [address],   [place], [place] [address]  Phone - [phone]   Fax: [phone]  [email or web address]

## 2012-06-12 NOTE — Progress Notes (Signed)
Hematology-Oncology Progress Note    Tyler Ballard  02-29-1981  [id number]  06/12/2012    Follow-up for: sickle cell anemia     [x]         Chart notes since last visit reviewed   [x]         Medications reviewed for allergies and interactions       Case discussed with the following:         []         Case Manager                    []         Nursing Staff                                                                         []         Pathologist                                                                        []         FAMILY      Subjective:     Spoke with patient who complains of: pt. Seems comfortable at present, no c/o sob, dizziness    Objective:     Patient Vitals for the past 24 hrs:   BP Temp Pulse Resp SpO2   06/12/12 0933 138/75 mmHg 97.9 ??F (36.6 ??C) 70  16  96 %   06/12/12 0313 128/73 mmHg 98.9 ??F (37.2 ??C) 68  16  100 %   06/11/12 2019 151/79 mmHg 98.4 ??F (36.9 ??C) 74  16  99 %   06/11/12 1548 167/81 mmHg - 73  - -   06/11/12 1518 175/81 mmHg - 73  - -   06/11/12 1448 151/84 mmHg - 66  - -   06/11/12 1418 163/93 mmHg - 67  - -   06/11/12 1348 150/83 mmHg - 73  - -   06/11/12 1318 167/86 mmHg - 69  - -   06/11/12 1248 172/86 mmHg - 73  - -   06/11/12 1218 144/88 mmHg - 71  - -   06/11/12 1148 159/85 mmHg - 74  - -       REVIEW OF SYSTEMS:    Constitutional: negative fever, negative chills, negative weight loss  Eyes:   negative visual changes  ENT:   negative sore throat, tongue or lip swelling  Respiratory:  negative cough, negative dyspnea  Cards:  negative for chest pain, palpitations, lower extremity edema  GI:   negative for nausea, vomiting, diarrhea, and abdominal pain  Neuro:  negative for headaches, dizziness, vertigo  [x]                         Full ROS o/w normal/non contributor    Constitutional:  Patient looks  []         Sick  []   Frail  [x]         Better                                                 []         Depressed    HEENT:  [x]    NC                         []    AT                []     ALOPECIA           Eyes: [x]    Normal               []     Icteric  Oropharynx: [x]     Normal                  []   Thrush               []    Dry  Mucositis: [x]     None                 Grade: []         I  []         II  []         III  []         IV  Neck:   [x]    Supple                  []   Rigid               JVD:    [x]    ABSENT       []    PRESENT  Lymphadenopathy:   []    None Noted            []    PRESENT    Chest:  [x]    Clear               []     Rhonchi                      Dec'd @     []   Right Base           []    Left Base    CV:             [x]    Regular              []   Irregular               []    Tachy                []    Murmur  Abdominal:   [x]     Soft              []    NON-tender               []    Tender      BS:    [x]    ABSENT                   []    PRESENT  Liver:     [x]   NON-palp                  []   EDGE- palp  Spleen: [x]    NON-palp                   []   EDGE - palp  Mass:   [x]    ABSENT                          []   PRESENT  Extr:    []   Lymphedema             []    Cyanosis      []   Clubbing  Edema:     []    NONE       []    PRESENT  Skin:  Intact [x]            Purpura []         Rash: [x]    ABSENT       []   PRESENT  Neuro:  [x]         Normal  []         Confused      Available labs reviewed:  Labs:    Recent Results (from the past 24 hour(s))   CBC WITH AUTOMATED DIFF    Collection Time    06/11/12 11:45 AM       Component Value Range    WBC 2.5 (*) 4.1 - 11.1 K/uL    RBC 2.56 (*) 4.10 - 5.70 M/uL    HGB 7.6 (*) 12.1 - 17.0 g/dL    HCT 86.5 (*) 78.4 - 50.3 %    MCV 95.7  80.0 - 99.0 FL    MCH 29.7  26.0 - 34.0 PG    MCHC 31.0  30.0 - 36.5 g/dL    RDW 69.6 (*) 29.5 - 14.5 %    PLATELET 142 (*) 150 - 400 K/uL    NEUTROPHILS 50  32 - 75 %    LYMPHOCYTES 25  12 - 49 %    MONOCYTES 13  5 - 13 %    EOSINOPHILS 11 (*) 0 - 7 %    BASOPHILS 1  0 - 1 %    ABS. NEUTROPHILS 1.3 (*) 1.8 - 8.0 K/UL    ABS. LYMPHOCYTES 0.6 (*) 0.8 - 3.5 K/UL    ABS. MONOCYTES 0.3  0.0 - 1.0 K/UL    ABS. EOSINOPHILS  0.3  0.0 - 0.4 K/UL    ABS. BASOPHILS 0.0  0.0 - 0.1 K/UL    DF SMEAR SCANNED      RBC COMMENTS 1+ ANISOCYTOSIS     RENAL FUNCTION PANEL    Collection Time    06/11/12 11:45 AM       Component Value Range    Sodium 135 (*) 136 - 145 MMOL/L    Potassium 5.1  3.5 - 5.1 MMOL/L    Chloride 97  97 - 108 MMOL/L    CO2 28  21 - 32 MMOL/L    Anion gap 10  5 - 15 mmol/L    Glucose 104 (*) 65 - 100 MG/DL    BUN 47 (*) 6 - 20 MG/DL    Creatinine 2.84 (*) 0.45 - 1.15 MG/DL    BUN/Creatinine ratio 6 (*) 12 - 20      GFR est-AA 9 (*) >60 ml/min/1.10m2    GFR est non-AA 7 (*) >60 ml/min/1.56m2    Calcium 8.6  8.5 - 10.1 MG/DL    Phosphorus 3.8  2.5 - 4.9 MG/DL    Albumin 2.6 (*)  3.5 - 5.0 g/dL   GLUCOSE, POC    Collection Time    06/11/12  2:37 PM       Component Value Range    POC GLUCOSE 80  75 - 110 mg/dL    Performed by Langston Masker  CRISTA     GLUCOSE, POC    Collection Time    06/11/12  4:30 PM       Component Value Range    POC GLUCOSE 84  75 - 110 mg/dL    Performed by Huey Romans     GLUCOSE, POC    Collection Time    06/12/12  7:02 AM       Component Value Range    POC GLUCOSE 67 (*) 75 - 110 mg/dL    Performed by PETTIS ERNESTINE     GLUCOSE, POC    Collection Time    06/12/12  7:04 AM       Component Value Range    POC GLUCOSE 130 (*) 75 - 110 mg/dL    Performed by PETTIS ERNESTINE         Available Xrays reviewed:    Chemotherapy monitored and toxicities assessed:    Assessment and Plan   1. Sickle cell pain crisis...now off hydrea, Probably precipitated by pneumonia.(now resolved)... he looks much more  Comfortable ,.suspect he will do well with oral meds at this point followed at [facility] in the fellow's clinic  2. Anemia..hgb 7.6 on 6/24 it dropped a little bit but he is not symptomatic,, I believe he can be safely discharged with f/u in [place] later this week with dialysis/hematology,  The pt. States that he will follow up with his doctors there tomorrow or the next day., no transfusions ordered today  Secondary to #1  +/_ esrd...  Pneumonia.Marland Kitchen Resolved  Harlen Labs, MD

## 2012-06-16 ENCOUNTER — Inpatient Hospital Stay
Admit: 2012-06-16 | Discharge: 2012-06-19 | Disposition: A | Discharge status: Home / Self Care | Payer: MEDICARE | Attending: Internal Medicine | Admitting: Internal Medicine

## 2012-06-16 DIAGNOSIS — K3184 Gastroparesis: Secondary | ICD-10-CM

## 2012-06-16 NOTE — ED Notes (Signed)
Pt here with sickle cell crisis. Reports pain to back and legs & SOB, N/V. "Normal crisis for me".  Pt was seen here recently for sickle crisis and admitted to hospital.

## 2012-06-16 NOTE — ED Notes (Signed)
Pt reports nausea vomiting with pain to bilat legs and back and a "general feeling of pain all over", pt reports hx of sickel cell and sts "this is normal sickle cell crisis for me", pt presents with no apparent respiratory distress

## 2012-06-16 NOTE — H&P (Signed)
H&P dictated # [id number]  1.Sickle cell crises   2.Recent LLL pneumonia  3.N&V  4.HTN, Uncontrolled   5.DM

## 2012-06-16 NOTE — Progress Notes (Signed)
Date of Consult:  06/16/2012    Consult for:  Vancomycin dosing  Indication:  Empiric   Abx regimen:  Vancomycin, Zosyn  Recent Labs   [facility] 06/16/12 2018    WBC 4.2    CREA 6.41*    BUN 21*   Estimated CrCl:  ESRD on HD  Tmax last 24 hours:    Cultures:   No new cultures    Based on patient with ESRD on HD, will load with 2 grams of Vancomycin now (~25 mg/kg), then will start maintenance dose of 500 mg post-HD (MWF).  Will continue to monitor patient daily for changes in clinical status and will assess trough before the third dose of 500 mg and periodically thereafter.   Therapeutic trough goal of approximately 15-20 mcg/ml.      Thank you for this consult.  Eulogio Bear, PharmD

## 2012-06-16 NOTE — Other (Signed)
TRANSFER - OUT REPORT:    Verbal report given to Erin(name) on [employer]  being transferred to PTU(unit) for routine progression of care       Report consisted of patient???s Situation, Background, Assessment and   Recommendations(SBAR).     Information from the following report(s) SBAR, ED Summary, [facility] and Recent Results was reviewed with the receiving nurse.    Opportunity for questions and clarification was provided.

## 2012-06-16 NOTE — ED Provider Notes (Signed)
HPI Comments: 31 y.o.male with PMH significant for ESRD, HTN, diabetes, and sickle cell anemia complicated by acute chest a few months ago who presents to the ED secondary to back pain. Pt rates back pain at 8/10. Pt reports leg pain accompanies back pain. Pt reports this pain is similar to previous episodes of sickle cell crisis. Pt reports mild SOB. Pt reports typical Dilaudid 4 mg po for pain which is not helping with his pain now. Pt reports nausea for 2 days with difficulty keeping intake down. Pt reports he is dialyzed MWF in [place] as he has not established care with a nephrologist in [place] yet. Pt reports recent improvement in leg swelling. Pt denies chest pain, fever. No other acute medical complaints expressed.    Old chart reviewed: Recently admitted for sickle cell crisis.    Social history: Smoker. Non drinker. No drug use.    Primary Medical Providers are in [place]    Note written by Kenney Houseman, Scribe, as dictated by Vella Kohler, MD 8:18 PM          The history is provided by the patient.        Past Medical History   Diagnosis Date   ??? Sickle cell anemia    ??? ESRD on hemodialysis      MWF   ??? Diabetes mellitus    ??? Other ill-defined conditions      sickle cell   ??? Hypertension         Past Surgical History   Procedure Date   ??? Hx orthopaedic      rt hip    ??? Hx vascular access      av  fistula right arm.         History reviewed. No pertinent family history.     History     Social History   ??? Marital Status: SINGLE     Spouse Name: N/A     Number of Children: N/A   ??? Years of Education: N/A     Occupational History   ??? Not on file.     Social History Main Topics   ??? Smoking status: Current Everyday Smoker -- 0.2 packs/day for 10 years   ??? Smokeless tobacco: Never Used   ??? Alcohol Use: No   ??? Drug Use: No   ??? Sexually Active: No     Other Topics Concern   ??? Not on file     Social History Narrative   ??? No narrative on file                  ALLERGIES: Review of patient's allergies  indicates no known allergies.      Review of Systems   Constitutional: Negative for fever.   HENT: Negative for neck stiffness.    Eyes: Negative for visual disturbance.   Respiratory: Positive for shortness of breath. Negative for cough and wheezing.    Cardiovascular: Negative for chest pain and leg swelling.   Gastrointestinal: Negative for nausea, vomiting, abdominal pain and diarrhea.   Genitourinary: Negative for dysuria.   Musculoskeletal: Positive for back pain.        Leg pain.   Skin: Negative for rash.   Neurological: Negative.  Negative for syncope and headaches.   Hematological: Negative.    Psychiatric/Behavioral: Negative for confusion.   All other systems reviewed and are negative.    Note written by Kenney Houseman, Scribe, as dictated by Vella Kohler,  MD 8:19 PM        Filed Vitals:    06/16/12 1858   BP: 171/74   Pulse: 112   Temp: 99.1 ??F (37.3 ??C)   Resp: 18   Height: 5\' 5"  (1.651 m)   Weight: 79.379 kg (175 lb)   SpO2: 97%            Physical Exam   Nursing note and vitals reviewed.  Constitutional: He is oriented to person, place, and time. He appears well-developed and well-nourished. No distress.   HENT:   Head: Normocephalic and atraumatic.   Mouth/Throat: Oropharynx is clear and moist. No oropharyngeal exudate.   Eyes: Conjunctivae are normal. Pupils are equal, round, and reactive to light. Right eye exhibits no discharge. Left eye exhibits no discharge. No scleral icterus.   Neck: Normal range of motion. Neck supple.   Cardiovascular: Normal rate, regular rhythm and normal heart sounds.  Exam reveals no gallop and no friction rub.    No murmur heard.  Pulmonary/Chest: Effort normal and breath sounds normal. No respiratory distress. He has no wheezes. He has no rales.   Abdominal: Soft. Bowel sounds are normal. He exhibits no distension. There is no tenderness. There is no guarding.   Musculoskeletal: Normal range of motion. He exhibits edema. He exhibits no tenderness.    Lymphadenopathy:     He has no cervical adenopathy.   Neurological: He is alert and oriented to person, place, and time. No cranial nerve deficit. Coordination normal.   Skin: Skin is warm and dry. Rash (areas of excoriation on face which patient is scratching) noted. No pallor.        MDM    H/o sickle cell disease here with some sob and pain crises.  Check labs, cxr, pain meds, antiemetics.  Ivf.      Procedures    9:57 PM  Patient reports recent productive cough.  Note written by Kenney Houseman, Scribe, as dictated by Vella Kohler, MD 9:58 PM    10:01 PM  Patient states pain has improved to 7/10 from 10/10.  Note written by Kenney Houseman, Scribe, as dictated by Vella Kohler, MD 10:02 PM    10:56 PM  CONSULT NOTE:  Vella Kohler, MD spoke to Dr. Lebron Quam, hospitalist, concerning the patient. The patient's history, presentation, physical findings, and results were all discussed.   Note written by Kenney Houseman, Scribe, as dictated by Vella Kohler, MD 10:56 PM

## 2012-06-17 LAB — CBC WITH AUTOMATED DIFF
ABS. BASOPHILS: 0 10*3/uL (ref 0.0–0.1)
ABS. EOSINOPHILS: 0.4 10*3/uL (ref 0.0–0.4)
ABS. LYMPHOCYTES: 1.5 10*3/uL (ref 0.8–3.5)
ABS. MONOCYTES: 0.3 10*3/uL (ref 0.0–1.0)
ABS. NEUTROPHILS: 2.1 10*3/uL (ref 1.8–8.0)
BASOPHILS: 1 % (ref 0–1)
EOSINOPHILS: 9 % — ABNORMAL HIGH (ref 0–7)
HCT: 27.6 % — ABNORMAL LOW (ref 36.6–50.3)
HGB: 8.7 g/dL — ABNORMAL LOW (ref 12.1–17.0)
LYMPHOCYTES: 34 % (ref 12–49)
MCH: 30.6 PG (ref 26.0–34.0)
MCHC: 31.5 g/dL (ref 30.0–36.5)
MCV: 97.2 FL (ref 80.0–99.0)
MONOCYTES: 8 % (ref 5–13)
NEUTROPHILS: 48 % (ref 32–75)
PLATELET: 226 10*3/uL (ref 150–400)
RBC: 2.84 M/uL — ABNORMAL LOW (ref 4.10–5.70)
RDW: 16.9 % — ABNORMAL HIGH (ref 11.5–14.5)
WBC: 4.2 10*3/uL (ref 4.1–11.1)

## 2012-06-17 LAB — GLUCOSE, POC
Glucose (POC): 108 mg/dL (ref 75–110)
Glucose (POC): 108 mg/dL (ref 75–110)
Glucose (POC): 86 mg/dL (ref 75–110)
Glucose (POC): 97 mg/dL (ref 75–110)

## 2012-06-17 LAB — METABOLIC PANEL, COMPREHENSIVE
A-G Ratio: 0.5 — ABNORMAL LOW (ref 1.1–2.2)
ALT (SGPT): 23 U/L (ref 12–78)
AST (SGOT): 29 U/L (ref 15–37)
Albumin: 3.1 g/dL — ABNORMAL LOW (ref 3.5–5.0)
Alk. phosphatase: 138 U/L — ABNORMAL HIGH (ref 50–136)
Anion gap: 7 mmol/L (ref 5–15)
BUN/Creatinine ratio: 3 — ABNORMAL LOW (ref 12–20)
BUN: 21 MG/DL — ABNORMAL HIGH (ref 6–20)
Bilirubin, total: 0.4 MG/DL (ref 0.2–1.0)
CO2: 31 MMOL/L (ref 21–32)
Calcium: 9.1 MG/DL (ref 8.5–10.1)
Chloride: 96 MMOL/L — ABNORMAL LOW (ref 97–108)
Creatinine: 6.41 MG/DL — ABNORMAL HIGH (ref 0.45–1.15)
GFR est AA: 12 mL/min/{1.73_m2} — ABNORMAL LOW (ref >60)
GFR est non-AA: 10 mL/min/{1.73_m2} — ABNORMAL LOW (ref >60)
Globulin: 6.5 g/dL — ABNORMAL HIGH (ref 2.0–4.0)
Glucose: 131 MG/DL — ABNORMAL HIGH (ref 65–100)
Potassium: 4.2 MMOL/L (ref 3.5–5.1)
Protein, total: 9.6 g/dL — ABNORMAL HIGH (ref 6.4–8.2)
Sodium: 134 MMOL/L — ABNORMAL LOW (ref 136–145)

## 2012-06-17 LAB — RETICULOCYTE COUNT: Reticulocyte count: 3 % — ABNORMAL HIGH (ref 0.7–2.1)

## 2012-06-17 LAB — LACTIC ACID: Lactic acid: 0.7 MMOL/L (ref 0.4–2.0)

## 2012-06-17 MED ORDER — SODIUM CHLORIDE 0.9 % IV
500 mg | INTRAVENOUS | Status: DC
Start: 2012-06-17 — End: 2012-06-17

## 2012-06-17 MED ADMIN — HYDROmorphone (PF) (DILAUDID) injection 2 mg: INTRAVENOUS | @ 16:00:00 | NDC 00409255201

## 2012-06-17 MED ADMIN — amLODIPine (NORVASC) tablet 10 mg: ORAL | @ 12:00:00 | NDC 59762153006

## 2012-06-17 MED ADMIN — piperacillin-tazobactam (ZOSYN) 2.25 g in 0.9% sodium chloride (MBP/ADV) 50 mL MBP: INTRAVENOUS | @ 12:00:00 | NDC 00338055311

## 2012-06-17 MED ADMIN — levofloxacin (LEVAQUIN) 750 mg in D5W IVPB: INTRAVENOUS | @ 05:00:00 | NDC 72789024810

## 2012-06-17 MED ADMIN — HYDROmorphone (PF) (DILAUDID) injection 2 mg: INTRAVENOUS | @ 10:00:00 | NDC 00409255201

## 2012-06-17 MED ADMIN — sodium chloride 0.9 % bolus infusion 500 mL: INTRAVENOUS | @ 01:00:00 | NDC 00409798309

## 2012-06-17 MED ADMIN — piperacillin-tazobactam (ZOSYN) 3.375 g in 0.9% sodium chloride (MBP/ADV) 100 mL MBP: INTRAVENOUS | @ 02:00:00 | NDC 81284015110

## 2012-06-17 MED ADMIN — piperacillin-tazobactam (ZOSYN) 2.25 g in 0.9% sodium chloride (MBP/ADV) 50 mL MBP: INTRAVENOUS | @ 03:00:00 | NDC 00338055311

## 2012-06-17 MED ADMIN — HYDROmorphone (DILAUDID) injection 4 mg: INTRAVENOUS | @ 01:00:00 | NDC 00641012121

## 2012-06-17 MED ADMIN — ondansetron (ZOFRAN) injection 4 mg: INTRAVENOUS | @ 01:00:00 | NDC 00781301072

## 2012-06-17 MED ADMIN — HYDROmorphone (PF) (DILAUDID) injection 2 mg: INTRAVENOUS | @ 21:00:00 | NDC 00409255201

## 2012-06-17 MED ADMIN — HYDROmorphone (PF) (DILAUDID) injection 2 mg: INTRAVENOUS | @ 12:00:00 | NDC 00409255201

## 2012-06-17 MED ADMIN — levofloxacin (LEVAQUIN) 750 mg in D5W IVPB: INTRAVENOUS | @ 04:00:00 | NDC 25021013283

## 2012-06-17 MED ADMIN — metoprolol (LOPRESSOR) tablet 100 mg: ORAL | @ 21:00:00 | NDC 62584026611

## 2012-06-17 MED ADMIN — heparin (porcine) injection 5,000 Units: SUBCUTANEOUS | @ 12:00:00 | NDC 25021040201

## 2012-06-17 MED ADMIN — HYDROmorphone (DILAUDID) injection 2 mg: INTRAVENOUS | @ 02:00:00 | NDC 00641012121

## 2012-06-17 MED ADMIN — piperacillin-tazobactam (ZOSYN) 2.25 g in 0.9% sodium chloride (MBP/ADV) 50 mL MBP: INTRAVENOUS | @ 05:00:00 | NDC 81284015110

## 2012-06-17 MED ADMIN — heparin (porcine) injection 5,000 Units: SUBCUTANEOUS | @ 21:00:00 | NDC 25021040201

## 2012-06-17 MED ADMIN — B complex-vitaminC-folic acid (NEPHROCAP) cap: ORAL | @ 12:00:00 | NDC 68084006511

## 2012-06-17 MED ADMIN — heparin (porcine) injection 5,000 Units: SUBCUTANEOUS | @ 06:00:00 | NDC 25021040201

## 2012-06-17 MED ADMIN — HYDROmorphone (PF) (DILAUDID) injection 2 mg: INTRAVENOUS | @ 04:00:00 | NDC 00409255201

## 2012-06-17 MED ADMIN — 0.9% sodium chloride infusion: INTRAVENOUS | @ 10:00:00 | NDC 00409798309

## 2012-06-17 MED ADMIN — diphenhydrAMINE (BENADRYL) injection 25 mg: INTRAVENOUS | @ 01:00:00 | NDC 00641037621

## 2012-06-17 MED ADMIN — sodium chloride (NS) 0.9 % flush: @ 21:00:00 | NDC 87701099893

## 2012-06-17 MED ADMIN — diphenhydrAMINE (BENADRYL) capsule 25 mg: ORAL | @ 18:00:00 | NDC 00904530661

## 2012-06-17 MED ADMIN — metoprolol (LOPRESSOR) tablet 100 mg: ORAL | @ 12:00:00 | NDC 62584026611

## 2012-06-17 MED ADMIN — sodium chloride (NS) 0.9 % flush: @ 13:00:00 | NDC 87701099893

## 2012-06-17 MED ADMIN — HYDROmorphone (PF) (DILAUDID) injection 2 mg: INTRAVENOUS | @ 18:00:00 | NDC 00409255201

## 2012-06-17 MED ADMIN — 0.9% sodium chloride infusion: INTRAVENOUS | @ 06:00:00 | NDC 87701099893

## 2012-06-17 MED ADMIN — calcium acetate (PHOSLO) capsule 1,334 mg: ORAL | @ 21:00:00 | NDC 00054008826

## 2012-06-17 MED ADMIN — HYDROmorphone (PF) (DILAUDID) injection 2 mg: INTRAVENOUS | @ 07:00:00 | NDC 00409255201

## 2012-06-17 MED ADMIN — vancomycin (VANCOCIN) 2000 mg in NS 500 ml infusion: INTRAVENOUS | @ 06:00:00 | NDC 09999945502

## 2012-06-17 MED ADMIN — 0.9% sodium chloride infusion: INTRAVENOUS | @ 02:00:00 | NDC 00409798309

## 2012-06-17 MED ADMIN — lisinopril (PRINIVIL, ZESTRIL) tablet 40 mg: ORAL | @ 12:00:00 | NDC 68084006211

## 2012-06-17 MED FILL — SODIUM CHLORIDE 0.9 % IV: INTRAVENOUS | Qty: 1000

## 2012-06-17 MED FILL — HYDROMORPHONE (PF) 1 MG/ML IJ SOLN: 1 mg/mL | INTRAMUSCULAR | Qty: 2

## 2012-06-17 MED FILL — AMLODIPINE 5 MG TAB: 5 mg | ORAL | Qty: 2

## 2012-06-17 MED FILL — LISINOPRIL 20 MG TAB: 20 mg | ORAL | Qty: 2

## 2012-06-17 MED FILL — HYDROMORPHONE 2 MG/ML INJECTION SOLUTION: 2 mg/mL | INTRAMUSCULAR | Qty: 2

## 2012-06-17 MED FILL — HEPARIN (PORCINE) 5,000 UNIT/ML IJ SOLN: 5000 unit/mL | INTRAMUSCULAR | Qty: 1

## 2012-06-17 MED FILL — ZOSYN 2.25 GRAM INTRAVENOUS SOLUTION: 2.25 gram | INTRAVENOUS | Qty: 2.25

## 2012-06-17 MED FILL — METOPROLOL TARTRATE 50 MG TAB: 50 mg | ORAL | Qty: 2

## 2012-06-17 MED FILL — CALCIUM ACETATE 667 MG CAP: 667 mg | ORAL | Qty: 2

## 2012-06-17 MED FILL — NEPHROCAPS 1 MG CAPSULE: 1 mg | ORAL | Qty: 1

## 2012-06-17 MED FILL — PHARMACY INFORMATION NOTE: Qty: 1

## 2012-06-17 MED FILL — VANCOMYCIN IN 0.9 % SODIUM CHLORIDE 2 GRAM/500 ML IV: 2 gram/500 mL | INTRAVENOUS | Qty: 500

## 2012-06-17 MED FILL — BD POSIFLUSH NORMAL SALINE 0.9 % INJECTION SYRINGE: INTRAMUSCULAR | Qty: 10

## 2012-06-17 MED FILL — HYDROMORPHONE 2 MG/ML INJECTION SOLUTION: 2 mg/mL | INTRAMUSCULAR | Qty: 1

## 2012-06-17 MED FILL — LEVAQUIN 750 MG/150 ML IN 5 % DEXTROSE INTRAVENOUS PIGGYBACK: 750 mg/150 mL | INTRAVENOUS | Qty: 150

## 2012-06-17 MED FILL — LABETALOL 5 MG/ML IV SOLN: 5 mg/mL | INTRAVENOUS | Qty: 2

## 2012-06-17 MED FILL — SODIUM CHLORIDE 0.9 % IV: INTRAVENOUS | Qty: 500

## 2012-06-17 MED FILL — DIPHENHYDRAMINE HCL 50 MG/ML IJ SOLN: 50 mg/mL | INTRAMUSCULAR | Qty: 1

## 2012-06-17 MED FILL — ONDANSETRON (PF) 4 MG/2 ML INJECTION: 4 mg/2 mL | INTRAMUSCULAR | Qty: 2

## 2012-06-17 MED FILL — DIPHENHYDRAMINE 25 MG CAP: 25 mg | ORAL | Qty: 1

## 2012-06-17 NOTE — Progress Notes (Signed)
Hospitalist Progress Note            Daily Progress Note: 06/17/2012    Assessment/Plan:   1. Nausea vomiting : do not look like sickle cell crisis, ? Gastroparesis form chronic narcotics, explain to patient stated " I never had it I do not think I have gastroparesis" advance diet to see if tolerate, recommend to minimize narcotics  2. Back and leg pain : again do not think have sickle cell crisis, ? Drug seeking behavior, not able to take Po because of nausea, also complain about nausea last time with changing medication to po and was able to eat food though, cont IV dilaudid for now if able to eat food then change to Po  3. Cough : no change in xray I do not think it would go away any way form last pneumonia, like to watch off of antibiotics, if develop high grade fever then might restart or check CT scan, start incentive spirometry  4. ESRD HD as per nephro  5. HTN cont current meds  6. dispo home in AM if no high grade fever, Hb remain stable and ablae toeat       Subjective:   Cough with greenish sputum, fever 99.1  Here, no chest pain chronic pain in leg and back    Review of Systems:   Negative other then mention in subjective finding.    Objective:     BP 155/84   Pulse 68   Temp 96.7 ??F (35.9 ??C)   Resp 18   Ht 5\' 5"  (1.651 m)   Wt 187 lb 13.3 oz   BMI 31.26 kg/m2   SpO2 97%   O2 Device: Room air    Temp (24hrs), Avg:98 ??F (36.7 ??C), Min:96.7 ??F (35.9 ??C), Max:99.1 ??F (37.3 ??C)           06/28 1900 - 06/30 0659  In: 120 [P.O.:120]  Out: -   General: AAOx3 cooperative, no acute distress.  HEENT: Atraumatic. PERRL, EOMI. Anicteric sclerae.  Neck : Supple, no thyroidomegaly, no JVD.  Lungs: CTA bilaterally. No wheezing/rhonchi/rales.  Heart: Regular rhythm, no murmur, no rubs, no gallops.  Abdomen: Soft, non-distended, non-tender. + Bowel sounds.  Extremities: +2 edema, no clubbing, no cyanosis.  Neurologic: CN 2-12 grossly intact.  Alert and oriented  X 3.  No acute neurological distress.   Psych: Good insight. Not anxious nor agitated.    Additional comments: None      Data Review:     No results found for this basename: ITNL in the last 72 hours   No results found for this basename: CPK:3,CKMB:3,TROIQ:3, in the last 72 hours  Recent Labs   [facility] 06/16/12 2018    NA 134*    K 4.2    CL 96*    CO2 31    BUN 21*    CREA 6.41*    GLU 131*    PHOS --    MG --    ALB 3.1*    WBC 4.2    HGB 8.7*    HCT 27.6*    PLT 226     No results found for this basename: INR:3,PTP:3,APTT:3, in the last 72 hours    Medications reviewed  Current Facility-Administered Medications   Medication Dose Route Frequency   ??? amLODIPine (NORVASC) tablet 10 mg  10 mg Oral DAILY   ??? calcium acetate (PHOSLO) capsule 1,334 mg  2 Cap Oral TID WITH MEALS   ??? lisinopril (PRINIVIL, ZESTRIL)  tablet 40 mg  40 mg Oral DAILY   ??? metoprolol (LOPRESSOR) tablet 100 mg  100 mg Oral BID   ??? heparin (porcine) injection 5,000 Units  5,000 Units SubCUTAneous Q8H   ??? insulin lispro (HUMALOG) injection   SubCUTAneous TIDAC   ??? B complex-vitaminC-folic acid (NEPHROCAP) cap  1 Cap Oral DAILY   ??? sodium chloride (NS) 0.9 % flush       ??? HYDROmorphone (DILAUDID) injection 4 mg  4 mg IntraVENous ONCE   ??? ondansetron (ZOFRAN) injection 4 mg  4 mg IntraVENous ONCE   ??? diphenhydrAMINE (BENADRYL) injection 25 mg  25 mg IntraVENous NOW   ??? sodium chloride 0.9 % bolus infusion 500 mL  500 mL IntraVENous ONCE   ??? HYDROmorphone (DILAUDID) injection 2 mg  2 mg IntraVENous ONCE   ??? levofloxacin (LEVAQUIN) 750 mg in D5W IVPB  750 mg IntraVENous NOW   ??? vancomycin (VANCOCIN) 2000 mg in NS 500 ml infusion  2,000 mg IntraVENous NOW   ??? piperacillin-tazobactam (ZOSYN) 2.25 g in 0.9% sodium chloride (MBP/ADV) 50 mL MBP  2.25 g IntraVENous NOW       Care Plan discussed with: Patient    Total time spent with patient: 30 minutes.    Nadean Corwin, MD

## 2012-06-17 NOTE — H&P (Signed)
Name:       Tyler Ballard, Tyler Ballard                Admitted:    06/16/2012    Account #:  [id number]                     DOB:         1981-11-20  Physician:  Wendie Agreste, MD                   Age:         31                               HISTORY AND PHYSICAL      CHIEF COMPLAINT: Worsening cough for the last 3 days; nausea and vomiting  today.    HISTORY OF PRESENT ILLNESS: This is a 30 year old male with history of  sickle cell anemia. He was discharged recently from here a few days ago  following a prolonged hospitalization for sickle cell crisis and pneumonia.  He was treated at that time with Zosyn and Levaquin and was followed by ID.  Also, he was followed by oncologist for his sickle cell crisis. She has a  history of end-stage renal disease on hemodialysis Monday, Wednesday and  Friday, which he has been getting. He presented today to the emergency room  complaining of a worsening productive cough with greenish sputum over the  last 3 days. Also, today he had repeated nausea and vomiting about 6 to 7  times, unable to keep his medications down. He complained of pain also,  worsened all over, especially his back and extremities. In the emergency  room, he had a low-grade temperature of 99.1. However, he denies any  chills. No shortness of breath. No abdominal pain.    PAST MEDICAL HISTORY  1. Sickle cell anemia.  2. End-stage renal disease on hemodialysis.  3. Diabetes mellitus.  4. Hypertension.    FAMILY HISTORY: Both parents have sickle cell trait.    SOCIAL HISTORY: He is a smoker; 1 pack will last him about a week. He said  he is trying to cut down. Denies alcohol intake.    HOME MEDICATIONS  1. Amlodipine 10 mg daily.  2. Lisinopril 40 mg daily.  3. Metoprolol 100 mg twice daily.  4. Dilaudid 4 mg q.3 hours p.r.n.  5. PhosLo 667 mg, two capsules 3 times daily.  6. Folic acid with multivitamin tablet daily.    ALLERGIES: NO KNOWN DRUG ALLERGY.    REVIEW OF SYSTEMS  GENERAL: Denies fever or chills.  ENT: No  sore throat or runny nose.  CARDIOVASCULAR: No palpitation or chest pain.  PULMONARY: Positive for increased productive cough with green sputum. No  shortness of breath.  GASTROINTESTINAL: No abdominal pain. Positive for nausea and vomiting.  GENITOURINARY: He does not make urine on dialysis.  SKIN: No rash or erythema.  RENAL: No flank pain. He has chronic leg swelling, unchanged. He described  leg pain.  NEUROLOGIC: No numbness or weakness.  MUSCULOSKELETAL: Complains of pain, all extremities and back.    PHYSICAL EXAMINATION  VITAL SIGNS: Temperature 99.1, pulse rate 112, respiratory rate 18, blood  pressure 171/74, saturation 97% on room air.  HEENT: Head normocephalic, atraumatic. Pupils equal in size and reactive to  light. He had pink conjunctivae, nonicteric sclerae. Moist oral mucosa.  NECK: Supple. No JVD appreciated.  HEART: S1 and S2 regular. No murmur or thrill appreciated.  CHEST: Clear to auscultation bilateral. No added sounds.  ABDOMEN: Soft, nontender; bowel sounds present.  EXTREMITIES: He has bilateral leg edema. Nontender calves.  NEUROLOGIC: Patient moving all 4 extremities; no focal deficit.  PSYCHIATRY: Had good insight. He was oriented and alert.  SKIN: No rash or erythema noted.    TEST RESULTS: Chest x-ray showed no significant change in the left lower  lobe pneumonia. Lactic acid is 0.74. Sodium 134, potassium 4.2, chloride  96, CO2 of 31, glucose 131, BUN 21, creatinine 6.4, calcium 9.1, alkaline  phosphatase 138, total protein 9.6, albumin 3.1, globulin 6.5. Rest of  liver function tests within normal range. White cell count 4.2, hemoglobin  8.7, and platelet is 226. Reticulocyte count is 3.    ASSESSMENT  1. Sickle cell crisis.  2. Recent left lower lobe pneumonia.  3. Nausea and vomiting.  4. Hypertension, uncontrolled.  5. End-stage renal disease, on hemodialysis.  6. Diabetes mellitus.    PLAN:  Admit the patient to the hospital for further evaluation and  treatment.    1. Sickle  cell crisis. Will place the patient on IV Dilaudid and gentle IV  fluids given his hemodialysis status. Oxygen.  2. Recent left lower lobe pneumonia. Chest x-ray not much changed from 2  weeks ago. The patient has worsened cough. Received IV vancomycin, Levaquin  and Zosyn in the ER. I will continue with this. Follow up on blood culture  results as sent. Will check sputum cultures. Consult ID.  3. Repeated nausea and vomiting. Will order p.r.n. antiemetics; clear  liquid diet for now.  4. Hypertension, uncontrolled secondary to the patient unable to keep his  medications down. Will order p.r.n. IV labetalol; continue his home  medications if able to take.  5. End-stage renal disease on hemodialysis. We will consult his  nephrologist.  6. Diabetes mellitus. Will order sliding scale coverage and monitor.  7. DVT prophylaxis with subcutaneous heparin.    I have discussed the case with the ER physician, plan and recommendation  with the patient at the bedside in the ER.                Wendie Agreste, MD    cc:                       Wendie Agreste, MD      MF/wmx; D: 06/16/2012 11:39 P; T: 06/17/2012 11:19 A; DOC# [id number]; Job#  [id number]

## 2012-06-17 NOTE — Progress Notes (Signed)
Day #1 of Zosyn  Indication:  Possible PNA?  Current regimen:  2.25 Gm Q 6 hr  Abx regimen:  Vanc/Levaquin/Zosyn  Recent Labs   [facility] 06/16/12 2018    WBC 4.2    CREA 6.41*    BUN 21*     Estimated CrCl:  16 ml/min (Pt is on HD)  Tmax last 24 hours:  99.1  Cultures:   6/29 blood-pending  6/30 sputum-pending   Change to 2.25 Gm Q 8 H

## 2012-06-17 NOTE — Progress Notes (Signed)
Day #1 Levaquin  Indication:  Possible PNA?  Current regimen:  750 mg Q 24 hr  Abx regimen:  Vanc/Levaquin/Zosyn  Recent Labs   [facility] 06/16/12 2018    WBC 4.2    CREA 6.41*    BUN 21*     Estimated CrCl:  16 ml/min (Pt is on HD)  Tmax last 24 hours:  99.1  Cultures:   6/29 blood-pending  6/30 sputum-pending   Change to 750mg  X1(Given in ED) then 500 mg Q 48 H

## 2012-06-17 NOTE — Consults (Signed)
NEPHROLOGY CONSULT NOTE     Patient: Tyler Ballard MRN: [id number]  PCP: Sol Blazing, MD   DOB:     [birth year]  Age:   31 y.o.  Sex:  male      Referring physician: Sol Blazing, MD  Reason for consultation: 31 y.o. male with nausea and vomiting and is a Dialysis pt . consulted for Mx of ESRD   Admission Date: 06/16/2012  7:20 PM  LOS: 1 day      ASSESSMENT nan PLAN :   ESRD : HD MWF. 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 No other stenosis noted !   Anemia : SCD + ESRD.   HTN :controlled  Recent LLL PNA             Subjective:   HPI: Tyler Ballard is a 31 y.o. African American male who has been admitted to the hospital for nausea and vomiting. He was hospitalized for 2 weeks recently and was only d/c`d on Wednesday. He went to his dialysis unit on Friday and had 3.5 kg removed. Reports that his "sickle crisis pains " started during dialysis.  There is  Good possibility that he is drug seeking and not in crisis. Retic count is ok     Past Medical Hx:   Past Medical History   Diagnosis Date   ??? Sickle cell anemia    ??? ESRD on hemodialysis      MWF   ??? Diabetes mellitus    ??? Other ill-defined conditions      sickle cell   ??? Hypertension         Past Surgical Hx:     Past Surgical History   Procedure Date   ??? Hx orthopaedic      rt hip    ??? Hx vascular access      av  fistula right arm.       Medications:  Prior to Admission medications    Medication Sig Start Date End Date Taking? Authorizing Provider   amLODIPine (NORVASC) 10 mg tablet Take 1 Tab by mouth daily for 30 days. 06/12/12 07/12/12 Yes Sol Blazing, MD   lisinopril (PRINIVIL, ZESTRIL) 40 mg tablet Take 1 Tab by mouth daily for 30 days. 06/12/12 07/12/12 Yes Sol Blazing, MD   metoprolol (LOPRESSOR) 100 mg tablet Take 1 Tab by mouth two (2) times a day for 30 days. 06/12/12 07/12/12 Yes Sol Blazing, MD   insulin glargine (LANTUS) 100 unit/mL injection Look like now do not need Lantus, if your blood  sugar start running high for fasting blood sugar >150 then start Lantus 4 units daily if fasting blood sugar remain >150 then increase to 8 units daily as per previous home dose 06/12/12  Yes Sol Blazing, MD   HYDROmorphone (DILAUDID) 4 mg tablet Take  by mouth every three (3) hours as needed.   Yes Phys Other, MD   insulin aspart (NOVOLOG) 100 unit/mL injection by SubCUTAneous route. Sliding scale   Yes Phys Other, MD   calcium acetate (PHOSLO) 667 mg cap Take 2 Caps by mouth three (3) times daily (with meals).   Yes Phys Other, MD   folic acid 1 mg Tab 0.5 mg, multivitamin, stress formula Tab 1 Tab Take 1 Dose by mouth daily.   Yes Phys Other, MD       No Known Allergies    Social Hx:  reports that he has been smoking.  He has never used  smokeless tobacco. He reports that he does not drink alcohol or use illicit drugs.     History reviewed. No pertinent family history.    Review of Systems:  None I could see positive except for N/V/pain      Objective:    Vitals:    Filed Vitals:    06/17/12 0440 06/17/12 0750 06/17/12 0900 06/17/12 1205   BP: 158/86 155/84  135/71   Pulse: 70 68  65   Temp: 97.9 ??F (36.6 ??C) 96.7 ??F (35.9 ??C)  96.6 ??F (35.9 ??C)   Resp: 18 18  18    Height:       Weight: 85.2 kg (187 lb 13.3 oz)      SpO2: 98% 97% 97% 98%     I&O's:  06/29 0700 - 06/30 0659  In: 120 [P.O.:120]  Out: -   BP 135/71   Pulse 65   Temp 96.6 ??F (35.9 ??C)   Resp 18   Ht 5\' 5"  (1.651 m)   Wt 85.2 kg (187 lb 13.3 oz)   BMI 31.26 kg/m2   SpO2 98%    Physical Exam:  Neck: Supple, no mass   Resp: Lungs CTA B/L, no wheezing , normal respiratory effort   CV: RRR, no murmur or rub,trace LE edema   GI: Soft, NT, + Bowel sounds, no hepatosplenomegaly   Neurologic: Non focal   Psych: AAO x 3 appropriate affect   Skin: No Rash. Multiple acneform lesions on face   Extremities : (R) AVF with good thrill      Laboratory Results:    Lab Results   Component Value Date    BUN 21* 06/16/2012    NA 134* 06/16/2012    K 4.2 06/16/2012    CL  96* 06/16/2012    CO2 31 06/16/2012       Lab Results   Component Value Date    BUN 21* 06/16/2012    BUN 47* 06/11/2012    BUN 23* 06/09/2012    BUN 34* 06/08/2012    BUN 38* 06/07/2012    K 4.2 06/16/2012    K 5.1 06/11/2012    K 4.4 06/09/2012    K 5.8* 06/08/2012    K 6.1* 06/07/2012       Lab Results   Component Value Date    WBC 4.2 06/16/2012    RBC 2.84* 06/16/2012    HGB 8.7* 06/16/2012    HCT 27.6* 06/16/2012    MCV 97.2 06/16/2012    MCH 30.6 06/16/2012    RDW 16.9* 06/16/2012    PLT 226 06/16/2012       Lab Results   Component Value Date    PHOS 3.8 06/11/2012       Urine dipstick:   No results found for this basename: color, apprn, spgru, refsg, phu, protu, glucu, ketu, bilu, urou, nitu, leuku, gluke, epsu, bactu, wbcu, rbcu, casts, ucry       I have reviewed the following:   All pertinent labs, microbiology data, radiology imaging for my assessment     ECG- Rev:  No  Xray/CT/US/MRI REV: Yes    Care Plan discussed with:  pt  Chart reviewed.  Total time spent with patient:  10 min   Medications list Personally Reviewed   [x]       Yes     []                No      Thank you for allowing  [place] to participate in the care of this patient.   We will follow patient. Please don???t hesitate to call with any questions    Rhae Hammock, MD  06/17/2012    [facility]  [address],   [place], [place] [address]  Phone - [phone]   Fax - [phone]  [email or web address]

## 2012-06-17 NOTE — Progress Notes (Signed)
Problem: Falls - Risk of  Goal: *Absence of falls  Outcome: Progressing Towards Goal  Patient's bed in low, locked position. Non skid footwear in place. Three side rails up, call bell in reach.  Goal: *Knowledge of fall prevention  Outcome: Progressing Towards Goal  Patient alert and oriented times four uses call bell appropriately

## 2012-06-17 NOTE — Progress Notes (Signed)
Problem: Falls - Risk of  Goal: *Absence of falls  Outcome: Progressing Towards Goal  Patient's bed in low, locked position. Non skid footwear in place. Three side rails up, call bell in reach.  Goal: *Knowledge of fall prevention  Outcome: Progressing Towards Goal  Patient alert and oriented times four. Uses call bell appropriately.

## 2012-06-18 LAB — CBC WITH AUTOMATED DIFF
ABS. BASOPHILS: 0 10*3/uL (ref 0.0–0.1)
ABS. EOSINOPHILS: 0.4 10*3/uL (ref 0.0–0.4)
ABS. LYMPHOCYTES: 1.2 10*3/uL (ref 0.8–3.5)
ABS. MONOCYTES: 0.5 10*3/uL (ref 0.0–1.0)
ABS. NEUTROPHILS: 2.1 10*3/uL (ref 1.8–8.0)
BASOPHILS: 1 % (ref 0–1)
EOSINOPHILS: 10 % — ABNORMAL HIGH (ref 0–7)
HCT: 25.8 % — ABNORMAL LOW (ref 36.6–50.3)
HGB: 8.1 g/dL — ABNORMAL LOW (ref 12.1–17.0)
LYMPHOCYTES: 29 % (ref 12–49)
MCH: 30.5 PG (ref 26.0–34.0)
MCHC: 31.4 g/dL (ref 30.0–36.5)
MCV: 97 FL (ref 80.0–99.0)
MONOCYTES: 12 % (ref 5–13)
NEUTROPHILS: 48 % (ref 32–75)
PLATELET: 185 10*3/uL (ref 150–400)
RBC: 2.66 M/uL — ABNORMAL LOW (ref 4.10–5.70)
RDW: 17 % — ABNORMAL HIGH (ref 11.5–14.5)
WBC: 4.3 10*3/uL (ref 4.1–11.1)

## 2012-06-18 LAB — GLUCOSE, POC
Glucose (POC): 115 mg/dL — ABNORMAL HIGH (ref 75–110)
Glucose (POC): 106 mg/dL (ref 75–110)
Glucose (POC): 132 mg/dL — ABNORMAL HIGH (ref 75–110)
Glucose (POC): 94 mg/dL (ref 75–110)

## 2012-06-18 MED ADMIN — heparin (porcine) injection 5,000 Units: SUBCUTANEOUS | @ 05:00:00 | NDC 25021040201

## 2012-06-18 MED ADMIN — sodium chloride (NS) 0.9 % flush: @ 19:00:00 | NDC 82903065462

## 2012-06-18 MED ADMIN — HYDROmorphone (PF) (DILAUDID) injection 2 mg: INTRAVENOUS | @ 21:00:00 | NDC 00409255201

## 2012-06-18 MED ADMIN — sodium chloride (NS) 0.9 % flush: @ 21:00:00 | NDC 82903065462

## 2012-06-18 MED ADMIN — calcium acetate (PHOSLO) tablet 1,334 mg: ORAL | @ 21:00:00 | NDC 00574011302

## 2012-06-18 MED ADMIN — HYDROmorphone (PF) (DILAUDID) injection 2 mg: INTRAVENOUS | @ 18:00:00 | NDC 00409255201

## 2012-06-18 MED ADMIN — amLODIPine (NORVASC) tablet 10 mg: ORAL | @ 17:00:00 | NDC 59762153006

## 2012-06-18 MED ADMIN — sodium chloride (NS) 0.9 % flush: @ 16:00:00 | NDC 82903065462

## 2012-06-18 MED ADMIN — lisinopril (PRINIVIL, ZESTRIL) tablet 40 mg: ORAL | @ 17:00:00 | NDC 68084006211

## 2012-06-18 MED ADMIN — HYDROmorphone (PF) (DILAUDID) injection 2 mg: INTRAVENOUS | @ 12:00:00 | NDC 00409255201

## 2012-06-18 MED ADMIN — metoprolol (LOPRESSOR) tablet 100 mg: ORAL | @ 21:00:00 | NDC 62584026611

## 2012-06-18 MED ADMIN — epoetin alfa (EPOGEN;PROCRIT) injection 10,000 Units: INTRAVENOUS | @ 21:00:00 | NDC 59676031000

## 2012-06-18 MED ADMIN — heparin (porcine) 1,000 unit/mL injection 2,000 Units: INTRAVENOUS | @ 13:00:00 | NDC 25021040001

## 2012-06-18 MED ADMIN — B complex-vitaminC-folic acid (NEPHROCAP) cap: ORAL | @ 17:00:00 | NDC 68084006511

## 2012-06-18 MED ADMIN — insulin lispro (HUMALOG) injection: SUBCUTANEOUS | @ 16:00:00 | NDC 32849075501

## 2012-06-18 MED ADMIN — HYDROmorphone (PF) (DILAUDID) injection 2 mg: INTRAVENOUS | @ 03:00:00 | NDC 00409255201

## 2012-06-18 MED ADMIN — heparin (porcine) injection 5,000 Units: SUBCUTANEOUS | @ 17:00:00 | NDC 25021040201

## 2012-06-18 MED ADMIN — insulin lispro (HUMALOG) injection: SUBCUTANEOUS | @ 21:00:00 | NDC 32849075501

## 2012-06-18 MED ADMIN — HYDROmorphone (PF) (DILAUDID) injection 2 mg: INTRAVENOUS | @ 15:00:00 | NDC 00409255201

## 2012-06-18 MED ADMIN — HYDROmorphone (PF) (DILAUDID) injection 2 mg: INTRAVENOUS | @ 06:00:00 | NDC 00409255201

## 2012-06-18 MED ADMIN — HYDROmorphone (PF) (DILAUDID) injection 2 mg: INTRAVENOUS | @ 09:00:00 | NDC 00409255201

## 2012-06-18 MED ADMIN — HYDROmorphone (PF) (DILAUDID) injection 2 mg: INTRAVENOUS | NDC 00409255201

## 2012-06-18 MED ADMIN — metoprolol (LOPRESSOR) tablet 100 mg: ORAL | @ 17:00:00 | NDC 62584026611

## 2012-06-18 MED FILL — PROCRIT 10,000 UNIT/ML INJECTION SOLUTION: 10000 unit/mL | INTRAMUSCULAR | Qty: 1

## 2012-06-18 MED FILL — CALCIUM ACETATE 667 MG CAP: 667 mg | ORAL | Qty: 2

## 2012-06-18 MED FILL — HYDROMORPHONE (PF) 1 MG/ML IJ SOLN: 1 mg/mL | INTRAMUSCULAR | Qty: 2

## 2012-06-18 MED FILL — BD POSIFLUSH NORMAL SALINE 0.9 % INJECTION SYRINGE: INTRAMUSCULAR | Qty: 10

## 2012-06-18 MED FILL — AMLODIPINE 5 MG TAB: 5 mg | ORAL | Qty: 2

## 2012-06-18 MED FILL — NEPHROCAPS 1 MG CAPSULE: 1 mg | ORAL | Qty: 1

## 2012-06-18 MED FILL — HEPARIN (PORCINE) 1,000 UNIT/ML IJ SOLN: 1000 unit/mL | INTRAMUSCULAR | Qty: 2

## 2012-06-18 MED FILL — HEPARIN (PORCINE) 5,000 UNIT/ML IJ SOLN: 5000 unit/mL | INTRAMUSCULAR | Qty: 1

## 2012-06-18 MED FILL — METOPROLOL TARTRATE 50 MG TAB: 50 mg | ORAL | Qty: 2

## 2012-06-18 MED FILL — LISINOPRIL 20 MG TAB: 20 mg | ORAL | Qty: 2

## 2012-06-18 NOTE — Progress Notes (Signed)
Hospitalist Progress Note            Daily Progress Note: 06/18/2012    Assessment/Plan:   1. Nausea vomiting : do not look like sickle cell crisis, ? Gastroparesis form chronic narcotics, explain to patient stated " I never had it I do not think I have gastroparesis" advance diet to see if tolerate, recommend to minimize narcotics  2. Back and leg pain : again do not think have sickle cell crisis, ? Drug seeking behavior, not able to take Po because of nausea, also complain about nausea last time with changing medication to po and was able to eat food though, cont IV dilaudid and change to PO in AM if patient is taking PO.   3. Cough : no change in xray I do not think it would go away any way form last pneumonia, if develop high grade fever then might restart or check CT scan, start incentive spirometry. D/C iv VANCO   4. ESRD HD as per nephro  5. HTN cont current meds  6. dispo home in AM taking PO meds.   7.        Subjective:   On dialysis " lots of pain", not baseline yet.     Review of Systems:   no fever.     Objective:   Physical Exam:     BP 141/77   Pulse 77   Temp 98 ??F (36.7 ??C)   Resp 20   Ht 5\' 5"  (1.651 m)   Wt 194 lb 14.2 oz   BMI 32.43 kg/m2   SpO2 98%   O2 Device: Room air    Temp (24hrs), Avg:98.2 ??F (36.8 ??C), Min:97.9 ??F (36.6 ??C), Max:98.7 ??F (37.1 ??C)    07/01 0700 - 07/01 1859  In: -   Out: 4000    06/29 1900 - 07/01 0659  In: 120 [P.O.:120]  Out: -     General: AAOx3 cooperative, no acute distress.   HEENT: Atraumatic. PERRL, EOMI. Anicteric sclerae.   Neck : Supple, no thyroidomegaly, no JVD.   Lungs: CTA bilaterally. No wheezing/rhonchi/rales.   Heart: Regular rhythm, no murmur, no rubs, no gallops.   Abdomen: Soft, non-distended, non-tender. + Bowel sounds.   Extremities: +2 edema, no clubbing, no cyanosis.   Neurologic: CN 2-12 grossly intact. Alert and oriented X 3. No acute neurological distress.   Psych: Good insight. Not anxious nor agitated.            Data Review:       Recent Days:  Recent Labs   [facility] 06/18/12 0755 06/16/12 2018    WBC 4.3 4.2    HGB 8.1* 8.7*    HCT 25.8* 27.6*    PLT 185 226     Recent Labs   Basename 06/16/12 2018    NA 134*    K 4.2    CL 96*    CO2 31    GLU 131*    BUN 21*    CREA 6.41*    CA 9.1    MG --    PHOS --    ALB 3.1*    TBIL 0.4    SGOT 29    INR --     No results found for this basename: PH:3,PCO2:3,PO2:3,HCO3:3,FIO2:3 in the last 72 hours    24 Hour Results:  Recent Results (from the past 24 hour(s))   GLUCOSE, POC    Collection Time    06/17/12  5:37 PM  Component Value Range    POC GLUCOSE 97  75 - 110 mg/dL    Performed by Johny Shock     GLUCOSE, POC    Collection Time    06/17/12  9:33 PM       Component Value Range    POC GLUCOSE 115 (*) 75 - 110 mg/dL    Performed by Geraldine Contras     GLUCOSE, POC    Collection Time    06/18/12  6:13 AM       Component Value Range    POC GLUCOSE 106  75 - 110 mg/dL    Performed by Geraldine Contras     CBC WITH AUTOMATED DIFF    Collection Time    06/18/12  7:55 AM       Component Value Range    WBC 4.3  4.1 - 11.1 K/uL    RBC 2.66 (*) 4.10 - 5.70 M/uL    HGB 8.1 (*) 12.1 - 17.0 g/dL    HCT 91.4 (*) 78.2 - 50.3 %    MCV 97.0  80.0 - 99.0 FL    MCH 30.5  26.0 - 34.0 PG    MCHC 31.4  30.0 - 36.5 g/dL    RDW 95.6 (*) 21.3 - 14.5 %    PLATELET 185  150 - 400 K/uL    NEUTROPHILS 48  32 - 75 %    LYMPHOCYTES 29  12 - 49 %    MONOCYTES 12  5 - 13 %    EOSINOPHILS 10 (*) 0 - 7 %    BASOPHILS 1  0 - 1 %    ABS. NEUTROPHILS 2.1  1.8 - 8.0 K/UL    ABS. LYMPHOCYTES 1.2  0.8 - 3.5 K/UL    ABS. MONOCYTES 0.5  0.0 - 1.0 K/UL    ABS. EOSINOPHILS 0.4  0.0 - 0.4 K/UL    ABS. BASOPHILS 0.0  0.0 - 0.1 K/UL       Problem List:  Problem List as of 06/18/2012  Date Reviewed: 06-22-12      Codes Class Noted - Resolved    *Sickle cell crisis 282.62  05/24/2012 - Present              Medications reviewed  Current Facility-Administered Medications   Medication Dose Route Frequency   ??? sodium chloride (NS) 0.9 %  flush       ??? heparin (porcine) 1,000 unit/mL injection 2,000 Units  2,000 Units IntraVENous DIALYSIS PRN   ??? sodium chloride (NS) 0.9 % flush       ??? sodium chloride (NS) 0.9 % flush       ??? HYDROmorphone (DILAUDID) tablet 4 mg  4 mg Oral Q4H PRN   ??? amLODIPine (NORVASC) tablet 10 mg  10 mg Oral DAILY   ??? calcium acetate (PHOSLO) capsule 1,334 mg  2 Cap Oral TID WITH MEALS   ??? lisinopril (PRINIVIL, ZESTRIL) tablet 40 mg  40 mg Oral DAILY   ??? metoprolol (LOPRESSOR) tablet 100 mg  100 mg Oral BID   ??? labetalol (NORMODYNE;TRANDATE) injection 10 mg  10 mg IntraVENous Q4H PRN   ??? acetaminophen (TYLENOL) tablet 650 mg  650 mg Oral Q4H PRN   ??? ondansetron (ZOFRAN) injection 4 mg  4 mg IntraVENous Q4H PRN   ??? heparin (porcine) injection 5,000 Units  5,000 Units SubCUTAneous Q8H   ??? insulin lispro (HUMALOG) injection   SubCUTAneous TIDAC   ??? glucose chewable tablet 16 g  4 Tab Oral PRN   ??? dextrose (D50W) injection Syrg 12.5-25 g  12.5-25 g IntraVENous PRN   ??? glucagon (GLUCAGEN) injection 1 mg  1 mg IntraMUSCular PRN   ??? B complex-vitaminC-folic acid (NEPHROCAP) cap  1 Cap Oral DAILY   ??? diphenhydrAMINE (BENADRYL) capsule 25 mg  25 mg Oral Q6H PRN   ??? sodium chloride (NS) 0.9 % flush       ??? epoetin alfa (EPOGEN;PROCRIT) injection 10,000 Units  10,000 Units IntraVENous Q MON, WED & FRI   ??? HYDROmorphone (PF) (DILAUDID) injection 2 mg  2 mg IntraVENous Q3H PRN       Care Plan discussed with: Patient/Family and Nurse    Total time spent with patient: 30 minutes.    Laurice Record, MD

## 2012-06-18 NOTE — Procedures (Signed)
[facility] [place] [facility]                         [phone]  Vitals Pre Post Assessment Pre Post   BP 168/91 134/72 LOC A+ox3 A+ox3   HR 73 75 Lungs clear clear   Temp 97.80 98.2 Cardiac reg reg   Resp 16 16 Skin Warm,dry Warm,dry   Weight No  bed scales na Edema Bipedal edema bipedal edema      Pain Back pain, scheduled pain relievers admin b flooor rn Back pain, rcvs schedued pain reliever     Orders   Duration:4 Start: 0735 End: 1135 Total: 1135h   Dialyzer: rvclr   K Bath: 2   Ca Bath: 2   Na / Bicarb: 140/35   Target Fluid Removal:     Access   Type & Location: rua  fistla w/+thrill,+bruit   Comments:         15g needles                        Labs   Hep B status / date: Immune 62   Obtained/Reviewed  Critical Results Called y  na     Meds Given   Name Dose Route   Heparin  2000bolus ivp               Total Liters Process: 87.8   Net Fluid Removed:      Comments   Time Out Done: y   Primary Nurse Rpt Pre: Crista  morris,rn   Primary Nurse Rpt Post: Mar kingery,rn   Pt Education: yes   Tx Summary: Pt tolerated tx well, medicated for pain of back and legs by floor rn, at end of tx blood in circuit returneed w/338ml ns, needles pulled, sites dressed, needles pulled, pressure applied for<80mins each site

## 2012-06-18 NOTE — Other (Shared)
CRITICAL CARE INTERDISCIPLINARY ROUNDS      Patient Information:    Name:   Tyler Ballard    Age:   31 y.o.    Admission Date:   06/16/2012    Readmit Risk Assessment Information:   Prior Hospital Admissions  Patient Been Admitted to the Hospital Prior to this Encounter: Yes (less than 30 days)  Name of the Hospital(s): [facility]  Why Were You in the Hospital?: sickle cell  Specialist Care: No  Readmit Risk Tool  Scheduled Admission: No  Living Alone: Yes   Caregivers/Community Support: Yes  History of Mental Illness: No  Polypharmacy (Greater Than 7 Home Meds): No  Currently Prescribed : Insulin  Requires Financial, Physical and/or Educational Assistance With Medications: No  Independent with ADLs: Yes  Needs Assistance with Wound Care AND/OR Mgnt of O2, Nebulizer: No  Clinically Complex: Yes  History of Falls Within Past 3 Months: No  Chronic Medical Condition: Yes  Patient Directs Own Care: Yes  Readmit RISK Tool Score: 20    Surgery Date:      Day of Stay: 2    Expected Discharge Date:    06/18/12    Attending Provider:   Sol Blazing, MD    Surgeon:        Consultant:       Primary Care Provider:   Phys Other, MD    Problem List:     Patient Active Problem List   Diagnoses Code   ??? Sickle cell crisis 282.62       Principal Problem:  Sickle cell crisis    Procedure:   ***    During rounds the following quality care indicators and evidence based practices were addressed :     DVT Prophylaxis, PUD Prophylaxis, Pressure Ulcer Prevention and Pain Management           Acute MI/PCI:   Not applicable    Heart Failure:    Not applicable    Cardiac Surgery:  Not applicable    SCIP Measures for other Surgeries:   Not applicable    Pneumonia:    Blood Cultures before Initial Antibiotic    Stroke:      NIH Stroke Score       Transfer Level of Care:  Ready for Transfer    The patient will require the following interventions based on the Readmission Risk Assessment      Discharge Management:  Home    Anticipated  Discharge Date:  06/18/12      Interdisciplinary team rounds were held on *** with the following team members{Interdisciplinary Team:[id number]} and the  {Person; family members:[id number]}. Plan of care discussed. See clinical pathway and/or care plan for interventions and desired outcomes.

## 2012-06-18 NOTE — Procedures (Signed)
Procedures by Malon Kindle at 06/18/12 [phone]                Author: Malon Kindle  Service: --  Author Type: Registered Nurse       Filed: 06/18/12 1207  Date of Service: 06/18/12 0858  Status: Signed          Editor: [facility]                         [phone]          Vitals  Pre  Post  Assessment  Pre  Post            BP  168/91  134/72  LOC  A+ox3  A+ox3            HR  73  75  Lungs  clear  clear            Temp  97.80  98.2  Cardiac  reg  reg     Resp  16  16  Skin  Warm,dry  Warm,dry     Weight  No  bed scales  na  Edema  Bipedal edema  bipedal edema                  Pain  Back pain, scheduled pain relievers admin b flooor rn  Back pain, rcvs schedued pain reliever          Orders             Duration:4  Start:  0735  End:  1135  Total:  1135h        Dialyzer:  rvclr     K Bath:  2     Ca Bath:  2     Na / Bicarb:  140/35        Target Fluid Removal:           Access        Type & Location:  rua  fistla w/+thrill,+bruit       Comments:          15g needles                             Labs        Hep B status / date:  Immune 62        Obtained/Reviewed   Critical Results Called  y   na          Meds Given         Name  Dose  Route         Heparin   2000bolus  ivp                                     Total Liters Process:  87.8        Net Fluid Removed:            Comments        Time Out Done:  y     Primary Nurse Rpt Pre:  Crista  morris,rn  Primary Nurse Rpt Post:  Mar kingery,rn        Pt Education:  yes        Tx Summary:  Pt tolerated tx well, medicated for pain of back and legs by floor rn, at end of tx blood in circuit returneed w/352ml ns, needles pulled,  sites dressed, needles pulled, pressure applied for<23mins each site

## 2012-06-18 NOTE — Progress Notes (Signed)
Followup for ESRD. On dialysis now. Access performs well. (Recent 'plasty).  No new c/o. Agree that this is a surprisingly comfortable-looking pain crisis.    No jvd. Regular heart. Little peripheral edema. Access in the LUA looks OK. VP 170 with blood flow at 400.  No changes for now. Dialysis support.    LABS:   Recent Results (from the past 24 hour(s))   GLUCOSE, POC    Collection Time    06/17/12 12:37 PM       Component Value Range    POC GLUCOSE 86  75 - 110 mg/dL    Performed by Johny Shock     GLUCOSE, POC    Collection Time    06/17/12  5:37 PM       Component Value Range    POC GLUCOSE 97  75 - 110 mg/dL    Performed by Johny Shock     GLUCOSE, POC    Collection Time    06/17/12  9:33 PM       Component Value Range    POC GLUCOSE 115 (*) 75 - 110 mg/dL    Performed by Geraldine Contras     GLUCOSE, POC    Collection Time    06/18/12  6:13 AM       Component Value Range    POC GLUCOSE 106  75 - 110 mg/dL    Performed by Geraldine Contras         Patient Vitals for the past 12 hrs:   Temp Pulse Resp BP SpO2   06/18/12 0330 98.1 ??F (36.7 ??C) 71  18  139/69 mmHg 98 %   06/17/12 2353 97.9 ??F (36.6 ??C) 81  18  152/76 mmHg 100 %   06/17/12 2012 98.7 ??F (37.1 ??C) 79  18  161/79 mmHg 100 %

## 2012-06-19 LAB — GLUCOSE, POC
Glucose (POC): 130 mg/dL — ABNORMAL HIGH (ref 75–110)
Glucose (POC): 133 mg/dL — ABNORMAL HIGH (ref 75–110)

## 2012-06-19 MED ADMIN — lisinopril (PRINIVIL, ZESTRIL) tablet 40 mg: ORAL | @ 13:00:00 | NDC 68084006211

## 2012-06-19 MED ADMIN — sodium chloride (NS) 0.9 % flush: @ 07:00:00 | NDC 82903065462

## 2012-06-19 MED ADMIN — metoprolol (LOPRESSOR) tablet 100 mg: ORAL | @ 13:00:00 | NDC 62584026611

## 2012-06-19 MED ADMIN — calcium acetate (PHOSLO) tablet 1,334 mg: ORAL | @ 13:00:00 | NDC 00574011302

## 2012-06-19 MED ADMIN — B complex-vitaminC-folic acid (NEPHROCAP) cap: ORAL | @ 13:00:00 | NDC 68084006511

## 2012-06-19 MED ADMIN — heparin (porcine) injection 5,000 Units: SUBCUTANEOUS | @ 07:00:00 | NDC 25021040201

## 2012-06-19 MED ADMIN — heparin (porcine) injection 5,000 Units: SUBCUTANEOUS | @ 01:00:00 | NDC 63323026201

## 2012-06-19 MED ADMIN — heparin (porcine) injection 5,000 Units: SUBCUTANEOUS | @ 13:00:00 | NDC 25021040201

## 2012-06-19 MED ADMIN — sodium chloride (NS) 0.9 % flush: @ 01:00:00 | NDC 82903065462

## 2012-06-19 MED ADMIN — HYDROmorphone (PF) (DILAUDID) injection 2 mg: INTRAVENOUS | @ 07:00:00 | NDC 00409255201

## 2012-06-19 MED ADMIN — HYDROmorphone (PF) (DILAUDID) injection 2 mg: INTRAVENOUS | @ 01:00:00 | NDC 00409255201

## 2012-06-19 MED ADMIN — sodium chloride (NS) 0.9 % flush: @ 04:00:00 | NDC 87701099893

## 2012-06-19 MED ADMIN — HYDROmorphone (PF) (DILAUDID) injection 2 mg: INTRAVENOUS | @ 04:00:00 | NDC 00409255201

## 2012-06-19 MED ADMIN — amLODIPine (NORVASC) tablet 10 mg: ORAL | @ 13:00:00 | NDC 59762153006

## 2012-06-19 MED FILL — BD POSIFLUSH NORMAL SALINE 0.9 % INJECTION SYRINGE: INTRAMUSCULAR | Qty: 10

## 2012-06-19 MED FILL — HYDROMORPHONE 4 MG TAB: 4 mg | ORAL | Qty: 1

## 2012-06-19 MED FILL — HYDROMORPHONE (PF) 1 MG/ML IJ SOLN: 1 mg/mL | INTRAMUSCULAR | Qty: 2

## 2012-06-19 MED FILL — METOPROLOL TARTRATE 50 MG TAB: 50 mg | ORAL | Qty: 2

## 2012-06-19 MED FILL — CALCIUM ACETATE 667 MG TAB: 667 mg | ORAL | Qty: 2

## 2012-06-19 MED FILL — HEPARIN (PORCINE) 5,000 UNIT/ML IJ SOLN: 5000 unit/mL | INTRAMUSCULAR | Qty: 1

## 2012-06-19 MED FILL — AMLODIPINE 5 MG TAB: 5 mg | ORAL | Qty: 2

## 2012-06-19 MED FILL — NEPHROCAPS 1 MG CAPSULE: 1 mg | ORAL | Qty: 1

## 2012-06-19 MED FILL — LISINOPRIL 20 MG TAB: 20 mg | ORAL | Qty: 2

## 2012-06-19 NOTE — Progress Notes (Signed)
Pt. Wants to talk to Dr.Sun-upset that dilaudid was change to p.o.-pt. Refused p.o dilaudid and wants IV dilaudid,did not get pain meds since 2am.Pt. Stated he wants to leave.Dr. Wynelle Link came and discharged the pt. Patient left and did not wait for  discharge instructions.Nursing supervisor made aware

## 2012-06-19 NOTE — Progress Notes (Signed)
Patient's IV dilaudid d/c around 2 am and changed to PO dilaudid. Patient was taking other po meds without any problems. He refused to take po pain meds, and he wants to leave hospital now. No signs of acute infection, no sickle cell crisis. Stable to home.  Patient does not want any new prescription. He said he will take care of his dialysis.

## 2012-06-19 NOTE — Progress Notes (Signed)
Pressure Ulcer Documentation  (COMPLETE ONE LABEL PER PRESSURE ULCER)  For further information, please review corresponding Wound Care flowsheet.      Drago Cappello has:    No pressure ulcer noted and pressure ulcer prevention initiated.      Dyanne Iha, RN

## 2012-06-19 NOTE — Progress Notes (Signed)
Problem: Falls - Risk of  Goal: *Absence of falls  Outcome: Progressing Towards Goal  Pt ambulates in room without gait disturbance. Bed in lowest position with wheels locked.     Problem: Pressure Ulcer - Risk of  Goal: *Prevention of pressure ulcer  Outcome: Progressing Towards Goal  Pt is ambulatory and able to turn self in bed.     Problem: Pain  Goal: *Control of Pain  Outcome: Progressing Towards Goal  Pt requesting pain medications every 3 hours.

## 2012-06-19 NOTE — Progress Notes (Addendum)
Location: 4PTU - [address]01  Attn.: Nadean Corwin  DOB: March 06, 1981 / Age: 31  MR#: [id number] / Admit#: [id number]  Pt. First Name: Tyler Ballard  Pt. Last Name: [name]  [address]  [place], [place]  [address]                        Case Management - Progress Note  Initial Open Date: 06/18/2012  Case Manager: Noralyn Pick    Initial Open Date:  Social Worker: Jaynie Collins  Expected Date of Discharge:  Transferred From:  EC[facility] Bed Held Until:  Bed Held By:  Power of Attorney:  POA/Guardian/Conservator Capacity:  Primary Caregiver:  Living Arrangements:  Source of Income:  Payee:  Psychosocial History:  Cultural/Religious/Language Issues:  Education Level:  ADLS/Current Living Arrangements Issues:  Past Providers:  Will patient perform self care at discharge? Y  Anticipated Discharge Disposition Goal: Return to admission address  Assessment/Plan:   06/19/2012 10:03A  CM informed by physician of pt  expressed interest in discharge. CM went to pts room to offer outpatient  services and encourage follow up with [facility] [place], however pt was not in  room. Physician and nurse notified.  SVaughan,RN,MSN,CRM          06/18/2012 02:43P Chart reviewed, noted pt is a readmit. Bedside assessment  completed. CM voiced concern of importance to follow dialysis  recommendations upon discharge. Pt informed CM he went back to [facility] upon discharge and did receive dialysis treatment at [facility]. He  spoke with Marnee Guarneri regarding plans to relocate to [place] and  need to transfer dialysis services. Disposition plan at this time is home  with outpatient dailysis treatments. Will continue to follow and assist as  necessary.  SVaughan,RN,MSN,CRM          06/18/2012 08:55A  Chart reviewed for med necessity.  No d/c needs  identified at this time.Will follow.  SB Jannette Fogo, RN CRM  Resources at Discharge:  Service Providers at Discharge:  Dictating Provider:  Ramond Dial D

## 2012-06-19 NOTE — Discharge Summary (Signed)
Physician Discharge Summary     Patient ID:    Tyler Ballard  [id number]  31 y.o.  01/09/1981    Admit date: 06/22/12    Discharge date and time: 06/19/2012    Hospital Diagnoses and Treatment Rendered:   1. Nausea vomiting : do not look like sickle cell crisis, ? Gastroparesis form chronic narcotics, explain to patient stated " I never had it I do not think I have gastroparesis" advance diet to see if tolerate, recommend to minimize narcotics. No vomiting witnessed by nurse staff, and patient is taking all the PO meds. Likely narcotics seeking. When I stopped his IV dilaudid and changed to PO. He feels angry and wants to leave hospital immediately. Patient does not want any new prescriptions.   2. Back and leg pain : again do not think have sickle cell crisis, likley Drug seeking behavior. Retic count 3.   3. Cough : no change in xray I do not think it would go away any way form last pneumonia,no fever.   4. ESRD on HD: patient is a resident in [place]. He went back [place] after last admission for his dialysis. Patient said" don't worry about my dialysis", refused further offer.   5. HTN cont current meds  6. dispo home.           Chronic Diagnoses:    Problem List as of 06/19/2012  Date Reviewed: 06/22/12      Codes Class Noted - Resolved    *Sickle cell crisis 282.62  05/24/2012 - Present              Discharge Medications:   Current Discharge Medication List      CONTINUE these medications which have NOT CHANGED    Details   amLODIPine (NORVASC) 10 mg tablet Take 1 Tab by mouth daily for 30 days.  Qty: 30 Tab, Refills: 0      lisinopril (PRINIVIL, ZESTRIL) 40 mg tablet Take 1 Tab by mouth daily for 30 days.  Qty: 30 Tab, Refills: 0      metoprolol (LOPRESSOR) 100 mg tablet Take 1 Tab by mouth two (2) times a day for 30 days.  Qty: 60 Tab, Refills: 0      insulin glargine (LANTUS) 100 unit/mL injection Look like now do not need Lantus, if your blood sugar start running high for fasting  blood sugar >150 then start Lantus 4 units daily if fasting blood sugar remain >150 then increase to 8 units daily as per previous home dose  Qty: 1 Vial, Refills: 0      HYDROmorphone (DILAUDID) 4 mg tablet Take  by mouth every three (3) hours as needed.      insulin aspart (NOVOLOG) 100 unit/mL injection by SubCUTAneous route. Sliding scale      calcium acetate (PHOSLO) 667 mg cap Take 2 Caps by mouth three (3) times daily (with meals).      folic acid 1 mg Tab 0.5 mg, multivitamin, stress formula Tab 1 Tab Take 1 Dose by mouth daily.               Follow up Care:    1. Phys Other, MD in 1-2 weeks.  Please call to set up an appointment shortly after discharge.      Diet:  Diabetic Diet    Disposition:  Home.    Advanced Directive:   FULL x   DNR      Discharge Exam:  See today's note.  CONSULTATIONS: nephrology    Significant Diagnostic Studies:   06/16/2012: BUN 21 MG/DL* (High; Range: 6 - 20 MG/DL); Calcium 9.1 MG/DL (Range: 8.5 - 78.2 MG/DL); CO2 31 MMOL/L (Range: 21 - 32 MMOL/L); Creatinine 6.41 MG/DL* (High; Range: 9.56 - 2.13 MG/DL); Glucose 131 MG/DL* (High; Range: 65 - 086 MG/DL); HCT 57.8 %* (Low; Range: 36.6 - 50.3 %); HGB 8.7 g/dL* (Low; Range: 46.9 - 62.9 g/dL); Potassium 4.2 MMOL/L (Range: 3.5 - 5.1 MMOL/L); Sodium 134 MMOL/L* (Low; Range: 136 - 145 MMOL/L)    Recent Labs   Basename 06/18/12 0755 06/16/12 2018    WBC 4.3 4.2    HGB 8.1* 8.7*    HCT 25.8* 27.6*    PLT 185 226     Recent Labs   [facility] 06/16/12 2018    NA 134*    K 4.2    CL 96*    CO2 31    BUN 21*    CREA 6.41*    GLU 131*    CA 9.1    MG --    PHOS --    URICA --     Recent Labs   [facility] 06/16/12 2018    SGOT 29    GPT 23    AP 138*    TBIL 0.4    TP 9.6*    ALB 3.1*    GLOB 6.5*    GGT --    AML --    LPSE --     No results found for this basename: INR:3,PTP:3,APTT:3, in the last 72 hours   No results found for this basename: FE:2,TIBC:2,PSAT:2,FERR:2, in the last 72 hours   No results found for this basename: PH:2,PCO2:2,PO2:2,  in the last 72 hours  No results found for this basename: CPK:3,CKMB:3,TROPONINI:3, in the last 72 hours  Lab Results   Component Value Date/Time    POC GLUCOSE 133 06/19/2012  6:17 AM    POC GLUCOSE 130 06/18/2012  8:58 PM    POC GLUCOSE 132 06/18/2012  4:59 PM    POC GLUCOSE 94 06/18/2012 12:09 PM    POC GLUCOSE 106 06/18/2012  6:13 AM       CDMP Checked:   Yes x       Signed:  Laurice Record, MD  06/19/2012  9:46 AM

## 2012-06-21 LAB — CULTURE, BLOOD, PAIRED: Culture result:: NO GROWTH

## 2012-06-25 NOTE — Discharge Summary (Signed)
Physician Discharge Summary     Patient ID:    Tyler Ballard  [id number]  31 y.o.  November 14, 1981    Admit date: 05/24/2012    Discharge date and time: 06/12/2012    ACUTE DIAGNOSES:  pnuemonia,   sickle cell crises    CHRONIC MEDICAL DIAGNOSES:  Patient Active Problem List   Diagnoses Code   ??? Sickle cell crisis 282.62     Discharge Medications:   Patient's Medications   Start Taking    AMLODIPINE (NORVASC) 10 MG TABLET    Take 1 Tab by mouth daily for 30 days.    LISINOPRIL (PRINIVIL, ZESTRIL) 40 MG TABLET    Take 1 Tab by mouth daily for 30 days.    METOPROLOL (LOPRESSOR) 100 MG TABLET    Take 1 Tab by mouth two (2) times a day for 30 days.   Continue Taking    CALCIUM ACETATE (PHOSLO) 667 MG CAP    Take 2 Caps by mouth three (3) times daily (with meals).    FOLIC ACID 1 MG TAB 0.5 MG, MULTIVITAMIN, STRESS FORMULA TAB 1 TAB    Take 1 Dose by mouth daily.    HYDROMORPHONE (DILAUDID) 4 MG TABLET    Take  by mouth every three (3) hours as needed.    INSULIN ASPART (NOVOLOG) 100 UNIT/ML INJECTION    by SubCUTAneous route. Sliding scale   These Medications have changed    Modified Medication Previous Medication    INSULIN GLARGINE (LANTUS) 100 UNIT/ML INJECTION insulin glargine (LANTUS) 100 unit/mL injection       Look like now do not need Lantus, if your blood sugar start running high for fasting blood sugar >150 then start Lantus 4 units daily if fasting blood sugar remain >150 then increase to 8 units daily as per previous home dose    8 Units by SubCUTAneous route nightly.   Stop Taking    HYDROXYUREA (HYDREA) 500 MG CAPSULE    Take 500 mg by mouth daily.         Need to get suture remove on this Thursday or Friday at dialysis center       ?? It is important that you take the medication exactly as they are prescribed.   ?? Keep your medication in the bottles provided by the pharmacist and keep a list of the medication names, dosages, and times to be taken in your wallet.   ?? Do not take  other medications without consulting your doctor.       DIET:  Diabetic Diet    ACTIVITY: activity as tolerated    ADDITIONAL INFORMATION: If you experience any of the following symptoms then please call your primary care physician or return to the emergency room if you cannot get hold of your doctor: Fever, chills, nausea, vomiting, diarrhea, change in mentation, falling, bleeding, shortness of breath.    FOLLOW UP CARE:  Dr. Vaughan Browner Other, MD  you are to call and set up an appointment to see them in 2 weeks.    Follow-up with dialysis center for regular dialysis on M-W-F    Follow up with sickle cell clinic at [facility] and follow up with your hematologist in next 3 days    Disposition:  Home.    Advanced Directive: full code    Discharge Exam:  General: AAOx3 cooperative, no acute distress.   HEENT: Atraumatic. PERRL, EOMI. Anicteric sclerae.   Neck : Supple, no thyroidomegaly, no JVD.   Lungs: CTA bilaterally.  No wheezing/rhonchi/rales.   Heart: Regular rhythm, no murmur, no rubs, no gallops.   Abdomen: Soft, non-distended, non-tender. + Bowel sounds.   Extremities: No edema, no clubbing, no cyanosis.   Neurologic: CN 2-12 grossly intact. Alert and oriented X 3. No acute neurological distress.   Psych: Good insight. Not anxious nor agitated.      CONSULTATIONS: nephrology                                     Hematology Dr. Crista Curb    Significant Diagnostic Studies:   No results found for this basename: WBC:2,HGB:2,HCT:2,PLT:2, in the last 72 hours  No results found for this basename: NA:3,K:3,CL:3,CO2:3,BUN:3,CREA:3,GLU:3,CA:3,MG:3,PHOS:3,URICA:3, in the last 72 hours  No results found for this basename: SGOT:3,GPT:3,AP:3,TBIL:3,TP:3,ALB:3,GLOB:3,GGT:3,AML:3,AMYP:3,LPSE:3,HLPSE:3 in the last 72 hours  No results found for this basename: INR:3,PTP:3,APTT:3, in the last 72 hours   No results found for this basename: FE:2,TIBC:2,PSAT:2,FERR:2, in the last 72 hours   No results found for this basename:  PH:2,PCO2:2,PO2:2, in the last 72 hours  No results found for this basename: CPK:3,CKMB:3,TROPONINI:3, in the last 72 hours  Lab Results   Component Value Date/Time    POC GLUCOSE 133 06/19/2012  6:17 AM    POC GLUCOSE 130 06/18/2012  8:58 PM    POC GLUCOSE 132 06/18/2012  4:59 PM    POC GLUCOSE 94 06/18/2012 12:09 PM    POC GLUCOSE 106 06/18/2012  6:13 AM             HOSPITAL COURSE & TREATMENT RENDERED:   Assessment/Plan:      1. LLB pneumonia, poa- Improving with abx. Off of antibiotics now  2. Sickle cell crisis, poa - resolved. Followed by Hem/onc, discuss with patient to changing PO stated feel nauseated, continue IV today discharge tomorrow  3. ESRD on HD - HD as per nephrology . S/p balloon. Stable without signs of acute bleed. Followed by nephrology service.  4. Hyperkalemia: Resolved. Secondary to hemolysis,/esrd.   5. Anemia - s/p transfusion  6. Htn. Controlled/bp stable with pain management and dialysis.  7. DM- D/C Lantus no need for 2 units, ssi   8. Tobaco abuse - nicotine patch, counseled from admission  9. Chronic pain - pain management as per hematology , stable  10. Thrombocytopenia: stable plt. Continue to observe  11. DVT prophylaxis- heparin   12. dsipo home today and he stated that he is going back to [place] today and follow up with his dialysis center for dialysis tomorrow and sickle cell clinic         Time spend on discharge >35 min.  Signed:  Nadean Corwin, MD  06/25/2012  9:50 AM

## 2012-06-28 ENCOUNTER — Inpatient Hospital Stay
Admit: 2012-06-28 | Discharge: 2012-07-05 | Disposition: A | Discharge status: Home / Self Care | Payer: MEDICARE | Attending: Internal Medicine | Admitting: Internal Medicine

## 2012-06-28 DIAGNOSIS — D5700 Hb-SS disease with crisis, unspecified: Secondary | ICD-10-CM

## 2012-06-28 LAB — METABOLIC PANEL, COMPREHENSIVE
A-G Ratio: 0.4 — ABNORMAL LOW (ref 1.1–2.2)
ALT (SGPT): 21 U/L (ref 12–78)
AST (SGOT): 24 U/L (ref 15–37)
Albumin: 2.8 g/dL — ABNORMAL LOW (ref 3.5–5.0)
Alk. phosphatase: 153 U/L — ABNORMAL HIGH (ref 50–136)
Anion gap: 3 mmol/L — ABNORMAL LOW (ref 5–15)
BUN/Creatinine ratio: 6 — ABNORMAL LOW (ref 12–20)
BUN: 40 MG/DL — ABNORMAL HIGH (ref 6–20)
Bilirubin, total: 0.4 MG/DL (ref 0.2–1.0)
CO2: 33 MMOL/L — ABNORMAL HIGH (ref 21–32)
Calcium: 8.6 MG/DL (ref 8.5–10.1)
Chloride: 99 MMOL/L (ref 97–108)
Creatinine: 6.37 MG/DL — ABNORMAL HIGH (ref 0.45–1.15)
GFR est AA: 12 mL/min/{1.73_m2} — ABNORMAL LOW (ref >60)
GFR est non-AA: 10 mL/min/{1.73_m2} — ABNORMAL LOW (ref >60)
Globulin: 6.8 g/dL — ABNORMAL HIGH (ref 2.0–4.0)
Glucose: 97 MG/DL (ref 65–100)
Potassium: 6.1 MMOL/L — ABNORMAL HIGH (ref 3.5–5.1)
Protein, total: 9.6 g/dL — ABNORMAL HIGH (ref 6.4–8.2)
Sodium: 135 MMOL/L — ABNORMAL LOW (ref 136–145)

## 2012-06-28 LAB — CBC WITH AUTOMATED DIFF
ABS. BASOPHILS: 0 10*3/uL (ref 0.0–0.1)
ABS. EOSINOPHILS: 0.4 10*3/uL (ref 0.0–0.4)
ABS. LYMPHOCYTES: 1.1 10*3/uL (ref 0.8–3.5)
ABS. MONOCYTES: 0.4 10*3/uL (ref 0.0–1.0)
ABS. NEUTROPHILS: 1.5 10*3/uL — ABNORMAL LOW (ref 1.8–8.0)
BASOPHILS: 1 % (ref 0–1)
EOSINOPHILS: 12 % — ABNORMAL HIGH (ref 0–7)
HCT: 27.6 % — ABNORMAL LOW (ref 36.6–50.3)
HGB: 8.8 g/dL — ABNORMAL LOW (ref 12.1–17.0)
LYMPHOCYTES: 32 % (ref 12–49)
MCH: 31.2 PG (ref 26.0–34.0)
MCHC: 31.9 g/dL (ref 30.0–36.5)
MCV: 97.9 FL (ref 80.0–99.0)
MONOCYTES: 11 % (ref 5–13)
NEUTROPHILS: 44 % (ref 32–75)
PLATELET: 161 10*3/uL (ref 150–400)
RBC: 2.82 M/uL — ABNORMAL LOW (ref 4.10–5.70)
RDW: 16.6 % — ABNORMAL HIGH (ref 11.5–14.5)
WBC: 3.3 10*3/uL — ABNORMAL LOW (ref 4.1–11.1)

## 2012-06-28 LAB — LACTIC ACID: Lactic acid: 0.9 MMOL/L (ref 0.4–2.0)

## 2012-06-28 MED ORDER — HYDROMORPHONE 2 MG/ML INJECTION SOLUTION
2 mg/mL | Freq: Once | INTRAMUSCULAR | Status: AC
Start: 2012-06-28 — End: 2012-06-28
  Administered 2012-06-28: 23:00:00 via INTRAVENOUS

## 2012-06-28 MED ORDER — PROMETHAZINE 25 MG/ML INJECTION
25 mg/mL | INTRAMUSCULAR | Status: DC
Start: 2012-06-28 — End: 2012-06-28
  Administered 2012-06-28: via INTRAVENOUS

## 2012-06-28 MED ORDER — SODIUM CHLORIDE 0.9 % IJ SYRG
Freq: Three times a day (TID) | INTRAMUSCULAR | Status: DC
Start: 2012-06-28 — End: 2012-06-28

## 2012-06-28 MED ORDER — HYDROMORPHONE 2 MG/ML INJECTION SOLUTION
2 mg/mL | INTRAMUSCULAR | Status: AC
Start: 2012-06-28 — End: 2012-06-28
  Administered 2012-06-29: via INTRAVENOUS

## 2012-06-28 MED ORDER — SODIUM CHLORIDE 0.9 % IJ SYRG
INTRAMUSCULAR | Status: DC | PRN
Start: 2012-06-28 — End: 2012-06-28

## 2012-06-28 MED ORDER — HYDROMORPHONE 2 MG/ML INJECTION SOLUTION
2 mg/mL | Freq: Once | INTRAMUSCULAR | Status: AC
Start: 2012-06-28 — End: 2012-06-28
  Administered 2012-06-28: 22:00:00 via INTRAVENOUS

## 2012-06-28 MED ORDER — SODIUM POLYSTYRENE SULFONATE 15 G/60 ML ORAL SUSP
15 gram/60 mL | ORAL | Status: AC
Start: 2012-06-28 — End: 2012-06-28
  Administered 2012-06-29: via ORAL

## 2012-06-28 MED ORDER — HYDROMORPHONE (PF) 1 MG/ML IJ SOLN
1 mg/mL | Freq: Once | INTRAMUSCULAR | Status: DC
Start: 2012-06-28 — End: 2012-06-28
  Administered 2012-06-28: 23:00:00 via INTRAVENOUS

## 2012-06-28 MED ORDER — HYDROMORPHONE (PF) 1 MG/ML IJ SOLN
1 mg/mL | INTRAMUSCULAR | Status: DC | PRN
Start: 2012-06-28 — End: 2012-06-28
  Administered 2012-06-28: 23:00:00 via INTRAVENOUS

## 2012-06-28 MED ORDER — PROMETHAZINE 25 MG/ML INJECTION
25 mg/mL | INTRAMUSCULAR | Status: DC
Start: 2012-06-28 — End: 2012-06-28
  Administered 2012-06-28: 20:00:00 via INTRAVENOUS

## 2012-06-28 MED ORDER — ONDANSETRON (PF) 4 MG/2 ML INJECTION
4 mg/2 mL | INTRAMUSCULAR | Status: AC
Start: 2012-06-28 — End: 2012-06-28
  Administered 2012-06-28: 23:00:00 via INTRAVENOUS

## 2012-06-28 MED ORDER — DIPHENHYDRAMINE HCL 50 MG/ML IJ SOLN
50 mg/mL | INTRAMUSCULAR | Status: AC
Start: 2012-06-28 — End: 2012-06-28
  Administered 2012-06-28: 23:00:00 via INTRAVENOUS

## 2012-06-28 MED ADMIN — sodium chloride 0.9 % bolus infusion 250 mL: INTRAVENOUS | NDC 00409798309

## 2012-06-28 MED FILL — DIPHENHYDRAMINE HCL 50 MG/ML IJ SOLN: 50 mg/mL | INTRAMUSCULAR | Qty: 1

## 2012-06-28 MED FILL — HYDROMORPHONE (PF) 1 MG/ML IJ SOLN: 1 mg/mL | INTRAMUSCULAR | Qty: 4

## 2012-06-28 MED FILL — HYDROMORPHONE 2 MG/ML INJECTION SOLUTION: 2 mg/mL | INTRAMUSCULAR | Qty: 1

## 2012-06-28 MED FILL — SODIUM CHLORIDE 0.9 % IV: INTRAVENOUS | Qty: 250

## 2012-06-28 MED FILL — HYDROMORPHONE (PF) 1 MG/ML IJ SOLN: 1 mg/mL | INTRAMUSCULAR | Qty: 2

## 2012-06-28 MED FILL — HYDROMORPHONE 2 MG/ML INJECTION SOLUTION: 2 mg/mL | INTRAMUSCULAR | Qty: 2

## 2012-06-28 MED FILL — ONDANSETRON (PF) 4 MG/2 ML INJECTION: 4 mg/2 mL | INTRAMUSCULAR | Qty: 2

## 2012-06-28 MED FILL — PROCHLORPERAZINE EDISYLATE 5 MG/ML INJECTION: 5 mg/mL | INTRAMUSCULAR | Qty: 2

## 2012-06-28 NOTE — H&P (Addendum)
[facility]  [address] [place], [place]  [address]  [id number]    Admission History and Physical      NAME:  Tyler Ballard   DOB:   07-09-1981   MRN:  [id number]     PCP:  Sol Blazing, MD     Date/Time:  06/28/2012         Subjective:     CHIEF COMPLAINT: "I am having a sickle cell crisis"    HISTORY OF PRESENT ILLNESS:     Mr. Chrystal is a 31 y.o.  African American male with PMH of ESRD on HD MWF, HTN, DM and sickle cell admitted for sickle cell crisis. Pt states that he is having severe 10/10 bilaterally in his legs and back c/w prior sickle cell crises. No CP/SOB. No fevers/chills. Recently admitted to [facility] from 6/29-7/2 for PNA and sickle cell crisis. Pt does report cough with persistence of green sputum. Upon record review appears that pt was not discharged on antibiotics as discharging MD did not feel pt was acutely ill with pneumonia.     Past Medical History   Diagnosis Date   ??? Sickle cell anemia    ??? ESRD on hemodialysis      MWF   ??? Diabetes mellitus    ??? Other ill-defined conditions      sickle cell   ??? Hypertension         Past Surgical History   Procedure Date   ??? Hx orthopaedic      rt hip    ??? Hx vascular access      av  fistula right arm.       History   Substance Use Topics   ??? Smoking status: Current Everyday Smoker -- 0.2 packs/day for 10 years   ??? Smokeless tobacco: Never Used   ??? Alcohol Use: No      FH: Parents with sickle cell trait     No Known Allergies     Prior to Admission medications    Medication Sig Start Date End Date Taking? Authorizing Provider   amLODIPine (NORVASC) 10 mg tablet Take 1 Tab by mouth daily for 30 days. 06/12/12 07/12/12  Sol Blazing, MD   lisinopril (PRINIVIL, ZESTRIL) 40 mg tablet Take 1 Tab by mouth daily for 30 days. 06/12/12 07/12/12  Sol Blazing, MD   metoprolol (LOPRESSOR) 100 mg tablet Take 1 Tab by mouth two (2) times a day for 30 days. 06/12/12 07/12/12  Sol Blazing, MD   insulin glargine (LANTUS) 100  unit/mL injection Look like now do not need Lantus, if your blood sugar start running high for fasting blood sugar >150 then start Lantus 4 units daily if fasting blood sugar remain >150 then increase to 8 units daily as per previous home dose 06/12/12   Sol Blazing, MD   HYDROmorphone (DILAUDID) 4 mg tablet Take  by mouth every three (3) hours as needed.    Phys Other, MD   insulin aspart (NOVOLOG) 100 unit/mL injection by SubCUTAneous route. Sliding scale    Phys Other, MD   calcium acetate (PHOSLO) 667 mg cap Take 2 Caps by mouth three (3) times daily (with meals).    Phys Other, MD   folic acid 1 mg Tab 0.5 mg, multivitamin, stress formula Tab 1 Tab Take 1 Dose by mouth daily.    Phys Other, MD         Review of Systems:  (bold if positive,  if negative)    Gen:  Eyes:  ENT:  CVS:  Pulm:  GI:    GU:    MS:  Skin:  Psych:  Endo:    Hem:  Renal:    Neuro:     Leg pain, nausea/vomiting, back pain        Objective:      VITALS:    Vital signs reviewed; most recent are:    Visit Vitals   Item Reading   ??? BP 166/83   ??? Pulse 98   ??? Temp 98.4 ??F (36.9 ??C)   ??? Resp 18   ??? Ht 5\' 5"  (1.651 m)   ??? Wt 170 lb   ??? BMI 28.29 kg/m2   ??? SpO2 97%     SpO2 Readings from Last 6 Encounters:   06/28/12 97%   06/19/12 99%   06/12/12 96%        No intake or output data in the 24 hours ending 06/28/12 2055         Exam:     Physical Exam:    Gen:  Well-developed, well-nourished, in no acute distress  HEENT:  Pink conjunctivae, PERRL, hearing intact to voice, moist mucous membranes  Neck:  Supple, without masses, thyroid non-tender  Resp:  No accessory muscle use, clear breath sounds without wheezes rales or rhonchi  Card:  No murmurs, normal S1, S2 without thrills, bruits  Abd:  Soft, non-tender, non-distended, normoactive bowel sounds are present, no palpable organomegaly  Lymph:  No cervical adenopathy  Musc:  No cyanosis or clubbing  Skin:  No rashes or ulcers, skin turgor is good  Neuro:  Cranial nerves 3-12 are grossly intact,  grip strength is 5/5 bilaterally, dorsi / plantarflexion strength is 5/5 bilaterally, follows commands appropriately  Psych:  Alert with good insight.  Oriented to person, place, and time  Trace edema in the LE bilaterally   L EJ in place        Labs:    Recent Labs   [facility] 06/28/12 1800    WBC 3.3*    HGB 8.8*    HCT 27.6*    PLT 161     Recent Labs   Basename 06/28/12 1800    NA 135*    K 6.1*    CL 99    CO2 33*    GLU 97    BUN 40*    CREA 6.37*    CA 8.6    MG --    PHOS --    ALB 2.8*    TBIL 0.4    SGOT 24     Lab Results   Component Value Date/Time    POC GLUCOSE 133 06/19/2012  6:17 AM    POC GLUCOSE 130 06/18/2012  8:58 PM     No results found for this basename: PH:1,PCO2:1,PO2:1,HCO3:1,FIO2:1 in the last 72 hours  No results found for this basename: INR:1 in the last 72 hours    Acute series:   Fecal stasis. No obstruction or free air is identified. Again   noted is a parenchymal density in the left lower lobe may represent   pneumonia. There are severe erosive and arthritic changes right hip.    CXR:   1. Some improvement left-sided pneumonia  2. 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 There also appears to be some erosion of  the medial   head of the right clavicle as before       Assessment/Plan:     Sickle cell crisis (05/24/2012) - unclear trigger. Pt still with persistent cough. No fevers. No WBC count but ?bronchitis. ?volume depletion  -admit to tele considering elevated K+  -cautiously start IVF's in the setting of ESRD. No evidence of volume overload currently   -dilaudid 4mg  IV q2h PRN   -type and screen in the event of transfusion  -continue O2  -bowel regimen considering high narcotic use  -no evidence of acute chest at this time   -start folic acid   -continue home hydroxyurea 500mg  after HD   -will continue levofloxacin for now to complete course of therapy for ?bronchitis  though expect this is resolving considering no WBC count or fevers      Hyperkalemia (06/28/2012) - per pt, on HD MWF. Has not missed any sessions. S/p kayexalate, insulin/D50 and calcium gluconate in the ED   -monitor on tele   -check EKG for peaked T waves   -hold ACE I   -renal consulted for HD      ESRD on hemodialysis (06/28/2012) - on HD MWF   -renal consulted for HD      Unspecified essential hypertension (06/28/2012)   -continue amlodipine and metoprolol  -hold ACE I due to hyperK+     Type II or unspecified type diabetes mellitus with renal manifestations, not stated as uncontrolled (06/28/2012)  -ISS   -BG checks AC TID and qHS   -Lantus 4u qHS   -diabetic diet     Surrogate decision maker: Unknown     Total time spent with patient: 66 Minutes                  Care Plan discussed with: Patient    Discussed:  Care Plan    Prophylaxis:  Hep SQ    Probable Disposition:  Home w/Family           ___________________________________________________    Attending Physician: Gaspar Bidding, MD       11:00PM EKG reviewed. Sinus rhythm. Mildly peaked T waves noted. Pt given treatment for hyperK+ as above. Will repeat EKG and K+ in the AM

## 2012-06-28 NOTE — ED Notes (Signed)
Patient pressing call bell.  Requesting "another round of pain medicine and benadryl".  Patient rates pain 7/10.  Advised will notify physician.

## 2012-06-28 NOTE — ED Notes (Signed)
30 minutes called to [facility].

## 2012-06-28 NOTE — ED Notes (Signed)
Entered room to medicate patient for pain per orders.  Patient states "now you know I can't take any percocet right now.  My admitting doctor said I could have another dose of dilaudid in 15 minutes while she was in here, before I go upstairs".  Advised patient that only order for dilaudid was every 2 hours.  Patient states "Well I told you that she said 15 minutes, that means you have to call her to get an order".

## 2012-06-28 NOTE — ED Notes (Signed)
Patient states last dialysis was yesterday.  Potassium is 6.1 per chemistry.

## 2012-06-28 NOTE — ED Notes (Signed)
Admitting physician paged.

## 2012-06-28 NOTE — ED Notes (Signed)
Patient advised per Dr. Leeroy Bock that this is last dose of pain medication to be received in the ER.  If this dose does not manage pain level, will need to be admitted for pain management.

## 2012-06-28 NOTE — ED Notes (Signed)
Patient again pressing call bell.  Asked if this nurse has spoken with physician.  Advised will speak with physician as soon as possible.

## 2012-06-28 NOTE — ED Notes (Signed)
Pt started having pain all over body "sickle cell crisis" and nausea and vomiting.

## 2012-06-28 NOTE — Other (Signed)
TRANSFER - OUT REPORT:    Verbal report given to Bella Kennedy RN (name) on Thanos Cousineau  being transferred to 3rd floor Tele (unit) for routine progression of care       Report consisted of patient???s Situation, Background, Assessment and   Recommendations(SBAR).     Information from the following report(s) SBAR, ED Summary, [facility] and Recent Results was reviewed with the receiving nurse.    Opportunity for questions and clarification was provided.

## 2012-06-28 NOTE — ED Notes (Signed)
Kayexalate given to patient.  States can not drink at this time due to nausea.  Phenergan infusing.  Place on bedside table.  Advised to drink when nausea improved.

## 2012-06-28 NOTE — ED Provider Notes (Signed)
HPI Comments: Recently discharged from [facility] for pna, gastroparesis and sickle cell    Patient is a 31 y.o. male presenting with sickle cell disease, nausea, and vomiting. The history is provided by the patient.   Sickle Cell Crisis   This is a new problem. The current episode started 12 to 24 hours ago. The problem has not changed since onset.The problem occurs constantly. Patient reports no work related injury.The pain is associated with no known injury. The pain location is generalized. The quality of the pain is described as aching. The pain does not radiate. The pain is at a severity of 8/10. The pain is severe. Pertinent negatives include no chest pain, no fever, no numbness, no weight loss, no headaches, no abdominal pain, no abdominal swelling, no bowel incontinence, no perianal numbness, no bladder incontinence, no dysuria, no leg pain, no paresthesias, no paresis, no tingling and no weakness. Treatments tried: throwing up his regular medications. The treatment provided no relief.   Nausea   This is a new problem. The current episode started 12 to 24 hours ago. The problem occurs 2 to 4 times per day. The problem has not changed since onset.There has been no fever. Pertinent negatives include no chills, no fever, no sweats, no abdominal pain, no diarrhea, no headaches, no arthralgias, no myalgias, no cough, no URI and no headaches. The patient is not pregnant.  Vomiting   This is a new problem. The current episode started 12 to 24 hours ago. The problem occurs 2 to 4 times per day. The problem has not changed since onset.There has been no fever. Pertinent negatives include no chills, no fever, no sweats, no abdominal pain, no diarrhea, no headaches, no arthralgias, no myalgias, no cough, no URI and no headaches. The patient is not pregnant.       Past Medical History   Diagnosis Date   ??? Sickle cell anemia    ??? ESRD on hemodialysis      MWF   ??? Diabetes mellitus    ??? Other ill-defined conditions       sickle cell   ??? Hypertension         Past Surgical History   Procedure Date   ??? Hx orthopaedic      rt hip    ??? Hx vascular access      av  fistula right arm.         No family history on file.     History     Social History   ??? Marital Status: SINGLE     Spouse Name: N/A     Number of Children: N/A   ??? Years of Education: N/A     Occupational History   ??? Not on file.     Social History Main Topics   ??? Smoking status: Current Everyday Smoker -- 0.2 packs/day for 10 years   ??? Smokeless tobacco: Never Used   ??? Alcohol Use: No   ??? Drug Use: No   ??? Sexually Active: No     Other Topics Concern   ??? Not on file     Social History Narrative   ??? No narrative on file                  ALLERGIES: Review of patient's allergies indicates no known allergies.      Review of Systems   Constitutional: Negative for fever, chills, weight loss, appetite change and fatigue.   HENT: Negative for trouble  swallowing, neck pain and neck stiffness.    Respiratory: Negative for cough, chest tightness, shortness of breath and wheezing.    Cardiovascular: Negative for chest pain, palpitations and leg swelling.   Gastrointestinal: Positive for nausea and vomiting. Negative for abdominal pain, diarrhea, abdominal distention and bowel incontinence.   Genitourinary: Negative for bladder incontinence, dysuria, frequency and flank pain. Decreased urine volume: on dialysis, does not make urine, went to dialysis yesterday, goes MWF.   Musculoskeletal: Positive for back pain. Negative for myalgias, joint swelling, arthralgias and gait problem.   Neurological: Negative for dizziness, tingling, weakness, numbness, headaches and paresthesias.       Filed Vitals:    06/28/12 1433   BP: 177/92   Pulse: 103   Temp: 98.4 ??F (36.9 ??C)   Resp: 20   Height: 5\' 5"  (1.651 m)   Weight: 77.111 kg (170 lb)   SpO2: 97%            Physical Exam   Nursing note and vitals reviewed.  Constitutional: He is oriented to person, place, and time. He appears well-developed and  well-nourished.   HENT:   Head: Normocephalic and atraumatic.   Eyes: EOM are normal. Pupils are equal, round, and reactive to light.   Neck: Normal range of motion. Neck supple. No thyromegaly present.   Cardiovascular: Normal rate, regular rhythm and normal heart sounds.    Pulmonary/Chest: Effort normal and breath sounds normal. No respiratory distress. He has no wheezes. He has no rales. He exhibits no tenderness.   Abdominal: Soft. Bowel sounds are normal.   Musculoskeletal: Normal range of motion.   Lymphadenopathy:     He has no cervical adenopathy.   Neurological: He is alert and oriented to person, place, and time.   Skin: Skin is warm.   Psychiatric: He has a normal mood and affect.        MDM    Procedures    1900 Dr. Marti Sleigh stated he ordered another dose of dilaudid for the patient.    2000 went to check the status of the patient, pt upset states that we don't understand his pain, that the doses of 2mg  dilaudid are not enough, he needs 4mg  at a time and promethazine and that he's in a crisis and still has nausea and he might need to be admitted since we can't treat his pain appropriately.  Ordered 4mg  dilaudid and 25mg  of promethazine for him again. Potassium 6.1 Dr. Leeroy Bock ordered kayexalate, EKG.  Consulted hospitalist for admission.

## 2012-06-28 NOTE — ED Notes (Signed)
Discussed need to move patient to a monitored bed with Clinical Lead, Misty Stanley.

## 2012-06-28 NOTE — ED Notes (Signed)
Lights dimmed for comfort.  Call bell in reach.  Patient with no acute distress noted.  Appears to be sitting comfortable and watching television.  No frowning, guarding, special posture, or muscle tension noted.

## 2012-06-28 NOTE — ED Notes (Signed)
Spoke with Dr. Werner Lean via telephone.  States will place order for Dilaudid 4 mg IV.

## 2012-06-28 NOTE — Progress Notes (Signed)
[facility] Dosing Services: 06/28/12    Consult for levaquin dosing by Dr. Sudie Bailey  Pharmacist reviewed antibiotic appropriateness for  31 y.o. year old ,male for indication of pneumonia.  Pt on hemodialysis  Therapy day 1 (day of therapy)     Current Antimicrobial Therapy:  Levaquin 750mg  iv x1, then 500mg  iv q48h  Previous Dosing Regimen    Other Current Antibiotics    Serum Creatinine/ BUN Lab Results   Component Value Date/Time    Creatinine 6.37 06/28/2012  6:00 PM       Creatinine Clearance Estimated Creatinine Clearance: 16.1 ml/min (based on Cr of 6.37).   Temp 98.4 ??F (36.9 ??C) ()    H/H Lab Results   Component Value Date/Time    HGB 8.8 06/28/2012  6:00 PM      Platelets Lab Results   Component Value Date/Time    PLATELET 161 06/28/2012  6:00 PM      WBC Lab Results   Component Value Date/Time    WBC 3.3 06/28/2012  6:00 PM         Significant Cultures   Recommendations regarding antibiotic therapy:     Pharmacist Micheline Maze  Contact information: [phone]

## 2012-06-28 NOTE — ED Notes (Signed)
Patient moved to room 14 for monitoring due to large narcotic doses and elevated potassium.  Placed on monitor X 3.  Lights dimmed for comfort.  Call bell in reach.

## 2012-06-28 NOTE — Consults (Signed)
Full H+P to follow

## 2012-06-28 NOTE — ED Notes (Signed)
Patient states no relief from dilaudid at this time.  States "you just can't give me 2 mg and expect it to work".  Advised patient he has received 8 mg of Dilaudid in the past hour.  Patient states "yeah but I am used to boluses, you can't give a sickle cell person small doses and expect it to work like it does for everyone else".

## 2012-06-28 NOTE — ED Notes (Signed)
Pt asked what meds the doctor had ordered, advised pt the doctor had ordered Dilaudid 2mg  and compazine 10 mg. Pt yelled "that's not gonna be enough to help." pt then refused compazine stating "I know what I need I will tell you what to order." explained to pt I can not order meds that is up to the doctor. Pt again yelled stating "I know what I need and how to take care of myself." pt set up  from stretcher and pointed at me and continued to be argumentative. Shelah Lewandowsky RN charge nurse made aware of situation. Karie Mainland , RN in to speak with pt.

## 2012-06-28 NOTE — ED Notes (Signed)
Placed on O2 @ L/M via [place]

## 2012-06-29 LAB — TYPE AND SCREEN
ABO/Rh: B POS
Antibody Screen: NEGATIVE

## 2012-06-29 LAB — EKG, 12 LEAD, INITIAL
Atrial Rate: 99 {beats}/min
Calculated P Axis: 52 degrees
Calculated R Axis: 6 degrees
Calculated T Axis: 74 degrees
Diagnosis: NORMAL
P-R Interval: 192 ms
Q-T Interval: 366 ms
QRS Duration: 82 ms
QTC Calculation (Bezet): 469 ms
Ventricular Rate: 99 {beats}/min

## 2012-06-29 LAB — GLUCOSE, POC
Glucose (POC): 120 mg/dL — ABNORMAL HIGH (ref 75–110)
Glucose (POC): 120 mg/dL — ABNORMAL HIGH (ref 75–110)
Glucose (POC): 104 mg/dL (ref 75–110)

## 2012-06-29 LAB — TYPE & SCREEN
ABO/Rh(D): B POS
Antibody screen: NEGATIVE

## 2012-06-29 MED ORDER — INSULIN REGULAR HUMAN 100 UNIT/ML INJECTION
100 unit/mL | Freq: Once | INTRAMUSCULAR | Status: AC
Start: 2012-06-29 — End: 2012-06-28
  Administered 2012-06-29: 01:00:00 via SUBCUTANEOUS

## 2012-06-29 MED ORDER — DEXTROSE 50% IN WATER (D50W) IV SYRG
Freq: Once | INTRAVENOUS | Status: AC
Start: 2012-06-29 — End: 2012-06-28
  Administered 2012-06-29: 01:00:00 via INTRAVENOUS

## 2012-06-29 MED ORDER — OXYCODONE-ACETAMINOPHEN 10 MG-325 MG TAB
10-325 mg | ORAL | Status: DC | PRN
Start: 2012-06-29 — End: 2012-07-05

## 2012-06-29 MED ADMIN — metoprolol (LOPRESSOR) tablet 100 mg: ORAL | @ 13:00:00 | NDC 57664050658

## 2012-06-29 MED ADMIN — prochlorperazine (COMPAZINE) injection 10 mg: INTRAVENOUS | NDC 54868413700

## 2012-06-29 MED ADMIN — diphenhydrAMINE (BENADRYL) injection 12.5 mg: INTRAVENOUS | @ 12:00:00 | NDC 63323066401

## 2012-06-29 MED ADMIN — HYDROmorphone (DILAUDID) injection 4 mg: INTRAVENOUS | @ 14:00:00 | NDC 00641012121

## 2012-06-29 MED ADMIN — HYDROmorphone (DILAUDID) injection 4 mg: INTRAVENOUS | @ 10:00:00 | NDC 00641012121

## 2012-06-29 MED ADMIN — HYDROmorphone (DILAUDID) injection 4 mg: INTRAVENOUS | @ 20:00:00 | NDC 00641012121

## 2012-06-29 MED ADMIN — HYDROmorphone (DILAUDID) injection 4 mg: INTRAVENOUS | @ 06:00:00 | NDC 00641012121

## 2012-06-29 MED ADMIN — HYDROmorphone (DILAUDID) injection 4 mg: INTRAVENOUS | @ 12:00:00 | NDC 00641012121

## 2012-06-29 MED ADMIN — diphenhydrAMINE (BENADRYL) injection 12.5 mg: INTRAVENOUS | @ 06:00:00 | NDC 63323066401

## 2012-06-29 MED ADMIN — metoprolol (LOPRESSOR) tablet 100 mg: ORAL | @ 04:00:00 | NDC 57664050658

## 2012-06-29 MED ADMIN — promethazine (PHENERGAN) 25 mg in 0.9% sodium chloride 50 mL IVPB: INTRAVENOUS | NDC 00641092821

## 2012-06-29 MED ADMIN — ondansetron (ZOFRAN) injection 4 mg: INTRAVENOUS | @ 13:00:00 | NDC 00409475503

## 2012-06-29 MED ADMIN — HYDROmorphone (DILAUDID) injection 4 mg: INTRAVENOUS | @ 22:00:00 | NDC 00641012121

## 2012-06-29 MED ADMIN — B complex-vitaminC-folic acid (NEPHROCAP) cap: ORAL | @ 13:00:00 | NDC 70199002611

## 2012-06-29 MED ADMIN — polyethylene glycol (MIRALAX) packet 17 g: ORAL | @ 13:00:00 | NDC 51079030601

## 2012-06-29 MED ADMIN — sodium chloride (NS) flush 5-10 mL: INTRAVENOUS | @ 04:00:00 | NDC 82903065462

## 2012-06-29 MED ADMIN — calcium gluconate 1 g in 0.9% sodium chloride 100 mL IVPB: INTRAVENOUS | @ 01:00:00 | NDC 63323031110

## 2012-06-29 MED ADMIN — folic acid (FOLVITE) tablet 1 mg: ORAL | @ 13:00:00 | NDC 00603316232

## 2012-06-29 MED ADMIN — calcium acetate (PHOSLO) capsule 1,334 mg: ORAL | @ 13:00:00 | NDC 00054008826

## 2012-06-29 MED ADMIN — diphenhydrAMINE (BENADRYL) injection 12.5 mg: INTRAVENOUS | @ 18:00:00 | NDC 63323066401

## 2012-06-29 MED ADMIN — hydroxyurea (HYDREA) chemo cap 500 mg: ORAL | @ 21:00:00 | NDC 49884072401

## 2012-06-29 MED ADMIN — sodium chloride (NS) flush 5-10 mL: INTRAVENOUS | @ 10:00:00 | NDC 87701099893

## 2012-06-29 MED ADMIN — HYDROmorphone (DILAUDID) injection 4 mg: INTRAVENOUS | @ 18:00:00 | NDC 00641012121

## 2012-06-29 MED ADMIN — HYDROmorphone (DILAUDID) injection 4 mg: INTRAVENOUS | @ 16:00:00 | NDC 00641012121

## 2012-06-29 MED ADMIN — calcium acetate (PHOSLO) capsule 1,334 mg: ORAL | @ 21:00:00 | NDC 00054008826

## 2012-06-29 MED ADMIN — sodium chloride (NS) flush 5-10 mL: INTRAVENOUS | @ 20:00:00 | NDC 82903065462

## 2012-06-29 MED ADMIN — levofloxacin (LEVAQUIN) 750 mg in D5W IVPB: INTRAVENOUS | @ 04:00:00 | NDC 25021013283

## 2012-06-29 MED ADMIN — HYDROmorphone (DILAUDID) injection 4 mg: INTRAVENOUS | @ 04:00:00 | NDC 00641012121

## 2012-06-29 MED ADMIN — calcium acetate (PHOSLO) capsule 1,334 mg: ORAL | @ 17:00:00 | NDC 00054008826

## 2012-06-29 MED ADMIN — HYDROmorphone (DILAUDID) injection 4 mg: INTRAVENOUS | @ 08:00:00 | NDC 00641012121

## 2012-06-29 MED ADMIN — HYDROmorphone (DILAUDID) injection 4 mg: INTRAVENOUS | @ 02:00:00 | NDC 00641012121

## 2012-06-29 MED ADMIN — dextrose 5 % - 0.45% NaCl infusion: INTRAVENOUS | NDC 82468011674

## 2012-06-29 MED ADMIN — insulin glargine (LANTUS) injection 4 Units: SUBCUTANEOUS | @ 19:00:00 | NDC 09999222001

## 2012-06-29 MED FILL — INSULIN GLARGINE 100 UNIT/ML INJECTION: 100 unit/mL | SUBCUTANEOUS | Qty: 0.04

## 2012-06-29 MED FILL — CALCIUM GLUCONATE 100 MG/ML (10%) IV SOLN: 100 mg/mL (10%) | INTRAVENOUS | Qty: 10

## 2012-06-29 MED FILL — PROCRIT 10,000 UNIT/ML INJECTION SOLUTION: 10000 unit/mL | INTRAMUSCULAR | Qty: 2

## 2012-06-29 MED FILL — HYDROMORPHONE 2 MG/ML INJECTION SOLUTION: 2 mg/mL | INTRAMUSCULAR | Qty: 2

## 2012-06-29 MED FILL — CALCIUM ACETATE 667 MG CAP: 667 mg | ORAL | Qty: 2

## 2012-06-29 MED FILL — METOPROLOL TARTRATE 25 MG TAB: 25 mg | ORAL | Qty: 4

## 2012-06-29 MED FILL — RENAL CAPS 1 MG CAPSULE: 1 mg | ORAL | Qty: 1

## 2012-06-29 MED FILL — HYDROMORPHONE (PF) 4 MG/ML SYRINGE: 4 mg/mL | INTRAMUSCULAR | Qty: 1

## 2012-06-29 MED FILL — LEVAQUIN 750 MG/150 ML IN 5 % DEXTROSE INTRAVENOUS PIGGYBACK: 750 mg/150 mL | INTRAVENOUS | Qty: 150

## 2012-06-29 MED FILL — PROMETHAZINE 25 MG/ML INJECTION: 25 mg/mL | INTRAMUSCULAR | Qty: 1

## 2012-06-29 MED FILL — ONDANSETRON (PF) 4 MG/2 ML INJECTION: 4 mg/2 mL | INTRAMUSCULAR | Qty: 2

## 2012-06-29 MED FILL — OXYCODONE-ACETAMINOPHEN 10 MG-325 MG TAB: 10-325 mg | ORAL | Qty: 1

## 2012-06-29 MED FILL — DIPHENHYDRAMINE HCL 50 MG/ML IJ SOLN: 50 mg/mL | INTRAMUSCULAR | Qty: 1

## 2012-06-29 MED FILL — DEXTROSE 5%-1/2 NORMAL SALINE IV: INTRAVENOUS | Qty: 1000

## 2012-06-29 MED FILL — HYDROXYUREA 500 MG CAPSULE: 500 mg | ORAL | Qty: 1

## 2012-06-29 MED FILL — SENNA PLUS 8.6 MG-50 MG TABLET: ORAL | Qty: 1

## 2012-06-29 MED FILL — DEXTROSE 50% IN WATER (D50W) IV SYRG: INTRAVENOUS | Qty: 50

## 2012-06-29 MED FILL — INSULIN REGULAR HUMAN 100 UNIT/ML INJECTION: 100 unit/mL | INTRAMUSCULAR | Qty: 8

## 2012-06-29 MED FILL — SPS (WITH SORBITOL) 15 GRAM-20 GRAM/60 ML ORAL SUSPENSION: 15-20 gram/60 mL | ORAL | Qty: 120

## 2012-06-29 MED FILL — FOLIC ACID 1 MG TAB: 1 mg | ORAL | Qty: 1

## 2012-06-29 MED FILL — POLYETHYLENE GLYCOL 3350 17 GRAM (100 %) ORAL POWDER PACKET: 17 gram | ORAL | Qty: 1

## 2012-06-29 NOTE — Progress Notes (Signed)
Bedside and Verbal shift change report given to Tyra-RN (oncoming nurse) by Nga-RN (offgoing nurse).  Report given with SBAR, Kardex, MAR and Recent Results.

## 2012-06-29 NOTE — Progress Notes (Signed)
Nutrition - Braden Score Referral     Diet: Renal Diabetic  Body mass index is 33.79 kg/(m^2).  Skin Assessment: wnl    RD referral received via braden screen for decreased po intake. Pt w/ recent admission as [facility] for same dx. Pt currently receiving HD and soundly sleeping. HD RN at bedside states that he has been eating very well today w/ good appetite. Pt s/ some hx of non compliance noted.  No weight loss per chart review. No nutrition risk identified at this time. RD will f/u as appropriate for further nutrition intervention and assessment.       Tyler Ballard. Orlene Erm, RD, CNSC

## 2012-06-29 NOTE — Progress Notes (Addendum)
06/28/2012  2230   Patient questioning meds and all orders.  Requesting nurse to call Dr immediately to request need for benadryl.  INformed patient need to we need  review PTA meds together,  before paging Dr so that all questions can be addressed at one time.       Patient requested pain meds and benadryl immediately.   He received iv benadryl 19:29  And last iv dilaudid 2145 in the ED.   After reviewing all meds .Spoke with Dr Sudie Bailey and she will place benadryl order.  Give according to last doses received.  Informed patient and he was compliant.  Next dose of Dilaudid can be given at 2339 and next dose of benadryl at 0130.  IV Levofloxin- one time dose-  was started.     06/28/2012  2339 pt received IV dilaudid.    06/29/2012  0139  Patient received IV dilaudid and IV benadryl   06/29/2012  4:30 AM  Patient educated to risks and benefits of SQ heparin injections and D51/2NS drip that Dr Sudie Bailey ordered.  Pt refused sq heparin, "I ambulate a lot, I dont need no shots".   Pt refused d51/2NS drip.  Spoke with Dr Arlyss Gandy  sky.  She said to make sure to educate patient .    Patient has been pacing the  [place] since admission.  He had complains of nausea but requested/received a heated dinner and 3 boxes of cranberry juice.  "Im not worried about my blood sugar" He had received insulin in the ED  Will continue to monitor and treat patient as ordered.    06/29/2012  5:17 AM  Two attempts for labs have been made.  By phlebotomist and by RN.  Patient refuses any further attempts.  "let them get them in dialysis."  Charge nurse informed. Will pass on in report

## 2012-06-29 NOTE — Consults (Signed)
[facility] Progress Note  [facility]   [address] [place], [place] [address]   [phone]      Assessment & Plan:   ESRD  ?? HD today and MWF  ?? 4 hour, 2/2 bath, EDW 85 kg    HTN  ?? Continue current meds and follow after HD    Hyperkalemia  ?? HD this am, recheck tomorrow  ?? Renal diet    CKD-MBD  ?? Phoslo    Anemia  ?? Due to CKD and SSDz  ?? EPO with HD       Subjective:   CC: Hyperkalemia  HPI: Pt admitted with sickle crisis and being treated symptomatically. He has ESRD on HD MWF in [place] and is in town for a family reunion. Last HD was Wednesday. He dialyzes for 4 hours via a RUE AVF and has a DW of 85 kg. He has HTN on multiple medications. He has anemia due to SS and CKD and his Hb is below goal.   ROS: No N/V, +SOB no CP  Current Facility-Administered Medications   Medication Dose Route Frequency   ??? epoetin alfa (EPOGEN;PROCRIT) injection 15,000 Units  15,000 Units IntraVENous Q MON, WED & FRI   ??? HYDROmorphone (DILAUDID) injection 2 mg  2 mg IntraVENous ONCE   ??? ondansetron (ZOFRAN) injection 4 mg  4 mg IntraVENous NOW   ??? sodium chloride 0.9 % bolus infusion 250 mL  250 mL IntraVENous ONCE   ??? HYDROmorphone (DILAUDID) injection 4 mg  4 mg IntraVENous ONCE   ??? diphenhydrAMINE (BENADRYL) injection 25 mg  25 mg IntraVENous NOW   ??? sodium polystyrene (KAYEXALATE) 15 gram/60 mL oral suspension 30 g  30 g Oral NOW   ??? HYDROmorphone (DILAUDID) injection 4 mg  4 mg IntraVENous NOW   ??? promethazine (PHENERGAN) 25 mg in 0.9% sodium chloride 50 mL IVPB  25 mg IntraVENous NOW   ??? calcium acetate (PHOSLO) capsule 1,334 mg  2 Cap Oral TID WITH MEALS   ??? metoprolol (LOPRESSOR) tablet 100 mg  100 mg Oral BID   ??? sodium chloride (NS) flush 5-10 mL  5-10 mL IntraVENous Q8H   ??? sodium chloride (NS) flush 5-10 mL  5-10 mL IntraVENous PRN   ??? acetaminophen (TYLENOL) tablet 650 mg  650 mg Oral Q4H PRN   ??? oxyCODONE-acetaminophen (PERCOCET 10) 10-325 mg per  tablet 1 Tab  1 Tab Oral Q4H PRN   ??? naloxone (NARCAN) injection 0.4 mg  0.4 mg IntraVENous PRN   ??? ondansetron (ZOFRAN) injection 4 mg  4 mg IntraVENous Q4H PRN   ??? insulin lispro (HUMALOG) injection   SubCUTAneous AC&HS   ??? glucose chewable tablet 16 g  4 Tab Oral PRN   ??? dextrose (D50W) injection Syrg 12.5-25 g  12.5-25 g IntraVENous PRN   ??? glucagon (GLUCAGEN) injection 1 mg  1 mg IntraMUSCular PRN   ??? polyethylene glycol (MIRALAX) packet 17 g  17 g Oral DAILY   ??? senna-docusate (PERICOLACE) 8.6-50 mg per tablet 1 Tab  1 Tab Oral DAILY   ??? insulin regular (NOVOLIN, HUMULIN) injection 8 Units  8 Units SubCUTAneous ONCE   ??? dextrose (D50W) injection Syrg 25 g  25 g IntraVENous ONCE   ??? calcium gluconate 1 g in 0.9% sodium chloride 100 mL IVPB  1 g IntraVENous ONCE   ??? folic acid (FOLVITE) tablet 1 mg  1 mg Oral DAILY   ??? labetalol (NORMODYNE;TRANDATE) 20 mg/4  mL (5 mg/mL) injection 20 mg  20 mg IntraVENous PRN   ??? hydroxyurea (HYDREA) chemo cap 500 mg  500 mg Oral DIALYSIS MON, WED, FRI   ??? insulin glargine (LANTUS) injection 4 Units  4 Units SubCUTAneous QHS   ??? heparin (porcine) injection 5,000 Units  5,000 Units SubCUTAneous Q8H   ??? HYDROmorphone (DILAUDID) injection 4 mg  4 mg IntraVENous ONCE   ??? levofloxacin (LEVAQUIN) 750 mg in D5W IVPB  750 mg IntraVENous ONCE   ??? levofloxacin (LEVAQUIN) 500 mg in D5W IVPB  500 mg IntraVENous Q48H   ??? diphenhydrAMINE (BENADRYL) injection 12.5 mg  12.5 mg IntraVENous Q6H PRN   ??? HYDROmorphone (DILAUDID) injection 4 mg  4 mg IntraVENous Q2H PRN   ??? amLODIPine (NORVASC) tablet 10 mg  10 mg Oral CUSTOM DAYS & TIMES   ??? amLODIPine (NORVASC) tablet 10 mg  10 mg Oral CUSTOM DAYS & TIMES   ??? B complex-vitaminC-folic acid (NEPHROCAP) cap  1 Cap Oral DAILY          Objective:     Vitals:  Blood pressure 161/92, pulse 81, temperature 98.4 ??F (36.9 ??C), resp. rate 18, height 5\' 5"  (1.651 m), weight 92.1 kg (203 lb 0.7 oz), SpO2 97.00%.  Temp (24hrs), Avg:98.2 ??F (36.8 ??C), Min:97.6 ??F  (36.4 ??C), Max:98.4 ??F (36.9 ??C)      Intake and Output:     07/10 1900 - 07/12 0659  In: -   Out: 1     Physical Exam:               GENERAL ASSESSMENT: NAD  CHEST: CTA  HEART: few crackles at bases  ABDOMEN: Soft,NT, +BS  EXTREMITY: 1+-2+ EDEMA            Data Review      No results found for this basename: ITNL in the last 72 hours   No results found for this basename: CPK:3,CKMB:3,TROIQ:3, in the last 72 hours  Recent Labs   Basename 06/28/12 1800    NA 135*    K 6.1*    CL 99    CO2 33*    BUN 40*    CREA 6.37*    GLU 97    PHOS --    MG --    CA 8.6    ALB 2.8*    WBC 3.3*    HGB 8.8*    HCT 27.6*    PLT 161      No results found for this basename: INR:3,PTP:3,APTT:3, in the last 72 hours  Needs: urine analysis, urine sodium, protein and creatinine  No results found for this basename: [facility]  [address]  [place], [place] [address]  Phone - [phone]  Fax - [phone]

## 2012-06-29 NOTE — Procedures (Incomplete)
[facility] [place] [facility]                         [phone]  Vitals Pre Post Assessment Pre Post   BP 171/91 160/85 LOC A&O x3 A&Ox3   HR 76 77 Lungs Clear, diminished Clear, diminished   Temp 98.4 98.4 Cardiac Regular, no tele Regular, no tele   Resp 18 18 Skin Warm/dry/intact Warm/dry/intact   Weight 96 kg 89.8 kg Edema Moderate throughout Moderate throughout      Pain Denies pain Denies pain     Orders   Duration: Start: 1200 End: 1600 Total: 4 hours   Dialyzer: Revaclear   K Bath: 2   Ca Bath: 2   Na / Bicarb: 140/35   Target Fluid Removal: 5500  ml     Access   Type & Location: RUA Fistula    Comments:                            Accessed with 15g needles without difficulty     Labs   Hep B status / date: HBAB 62 01/24/12   Obtained/Reviewed  Critical Results Called Reviewed       Meds Given   Name Dose Route   Procrit Dose never received                Total Liters Process: 100.9 Liters   Net Fluid Removed: 5500 ml      Comments   Time Out Done: [address] consent obtained, orders acknowledged, Full Code   Primary Nurse Rpt Pre: Christel Mormon, RN   Primary Nurse Rpt Post: Christel Mormon, RN   Pt Education: Fluid management, diet   Tx Summary: Treatment complete without difficulty.  All possible blood returned, pt stable.  Needle sites held <10 minutes each.  Hemostasis achieved.  Report given to RN at bedside.

## 2012-06-29 NOTE — Progress Notes (Signed)
Medical Progress Note      NAME: Tyler Ballard   DOB:  21-Sep-1981  MRM:  [id number]    Date/Time: 06/29/2012        Principal Problem:   *Sickle cell crisis (05/24/2012)  Active Problems:     Hyperkalemia (06/28/2012)     ESRD on hemodialysis (06/28/2012)     Unspecified essential hypertension (06/28/2012)     Type II or unspecified type diabetes mellitus with renal manifestations, not stated as uncontrolled (06/28/2012)        Assessment/Plan:   1.  Sickle cell crisis (05/24/2012) - unclear trigger. Pt still with persistent cough. No fevers. No WBC count but ?bronchitis.   -admit to tele considering elevated K+   -dilaudid 4mg  IV q2h PRN   -type and screen in the event of transfusion   -continue O2   -bowel regimen considering high narcotic use   -start folic acid   -continue home hydroxyurea 500mg  after HD   -will continue levofloxacin for now to complete course of therapy for ?bronchitis though expect this is resolving considering no WBC count or fevers     2.  Hyperkalemia (06/28/2012) - per pt, on HD MWF. Has not missed any sessions. S/p kayexalate, insulin/D50 and calcium gluconate in the ED   -monitor on tele   -hold ACE I   -renal consulted for HD     3.  ESRD on hemodialysis (06/28/2012) - on HD MWF   -renal consulted for HD     4.  Unspecified essential hypertension (06/28/2012)   -continue amlodipine and metoprolol   -hold ACE I due to hyperK+     5.  Type II or unspecified type diabetes mellitus with renal manifestations, not stated as uncontrolled (06/28/2012)   -ISS   -BG checks AC TID and qHS   -Lantus 4u qHS   -diabetic diet   - BS is well controlled.          Subjective:     Chief Complaint:  Body ache    ROS:  (bold if positive, if negative)      Tolerating PT  Tolerating Diet          Objective:       Vitals:          Last 24hrs VS reviewed since prior progress note. Most recent are:    Visit Vitals   Item Reading   ??? BP 161/89   ??? Pulse 88   ??? Temp 98.2 ??F (36.8 ??C)   ??? Resp 18   ??? Ht 5\' 5"  (1.651  m)   ??? Wt 203 lb 0.7 oz   ??? BMI 33.79 kg/m2   ??? SpO2 100%     SpO2 Readings from Last 6 Encounters:   06/29/12 100%   06/19/12 99%   06/12/12 96%    O2 Flow Rate (L/min): 2 l/min     Intake/Output Summary (Last 24 hours) at 06/29/12 0743  Last data filed at 06/29/12 0600   Gross per 24 hour   Intake      0 ml   Output      1 ml   Net     -1 ml          Exam:     Physical Exam:    Gen:  Well-developed, well-nourished, in no acute distress  HEENT:  Pink conjunctivae, PERRL, hearing intact to voice, moist mucous membranes  Neck:  Supple, without masses, thyroid non-tender  Resp:  No accessory  muscle use, clear breath sounds without wheezes rales or rhonchi  Card:  No murmurs, normal S1, S2 without thrills, bruits or peripheral edema  Abd:  Soft, non-tender, non-distended, normoactive bowel sounds are present, no palpable organomegaly and no detectable hernias  Lymph:  No cervical or inguinal adenopathy  Musc:  No cyanosis or clubbing  Skin:  No rashes or ulcers, skin turgor is good  Neuro:  Cranial nerves are grossly intact, no focal motor weakness, follows commands appropriately  Psych:  Good insight, oriented to person, place and time, alert           Medications Reviewed: (see below)    Lab Data Reviewed: (see below)    ______________________________________________________________________    Medications:     Current Facility-Administered Medications   Medication Dose Route Frequency   ??? HYDROmorphone (DILAUDID) injection 2 mg  2 mg IntraVENous ONCE   ??? ondansetron (ZOFRAN) injection 4 mg  4 mg IntraVENous NOW   ??? sodium chloride 0.9 % bolus infusion 250 mL  250 mL IntraVENous ONCE   ??? HYDROmorphone (DILAUDID) injection 4 mg  4 mg IntraVENous ONCE   ??? diphenhydrAMINE (BENADRYL) injection 25 mg  25 mg IntraVENous NOW   ??? sodium polystyrene (KAYEXALATE) 15 gram/60 mL oral suspension 30 g  30 g Oral NOW   ??? HYDROmorphone (DILAUDID) injection 4 mg  4 mg IntraVENous NOW   ??? promethazine (PHENERGAN) 25 mg in 0.9% sodium  chloride 50 mL IVPB  25 mg IntraVENous NOW   ??? calcium acetate (PHOSLO) capsule 1,334 mg  2 Cap Oral TID WITH MEALS   ??? metoprolol (LOPRESSOR) tablet 100 mg  100 mg Oral BID   ??? sodium chloride (NS) flush 5-10 mL  5-10 mL IntraVENous Q8H   ??? sodium chloride (NS) flush 5-10 mL  5-10 mL IntraVENous PRN   ??? acetaminophen (TYLENOL) tablet 650 mg  650 mg Oral Q4H PRN   ??? oxyCODONE-acetaminophen (PERCOCET 10) 10-325 mg per tablet 1 Tab  1 Tab Oral Q4H PRN   ??? naloxone (NARCAN) injection 0.4 mg  0.4 mg IntraVENous PRN   ??? ondansetron (ZOFRAN) injection 4 mg  4 mg IntraVENous Q4H PRN   ??? insulin lispro (HUMALOG) injection   SubCUTAneous AC&HS   ??? glucose chewable tablet 16 g  4 Tab Oral PRN   ??? dextrose (D50W) injection Syrg 12.5-25 g  12.5-25 g IntraVENous PRN   ??? glucagon (GLUCAGEN) injection 1 mg  1 mg IntraMUSCular PRN   ??? polyethylene glycol (MIRALAX) packet 17 g  17 g Oral DAILY   ??? senna-docusate (PERICOLACE) 8.6-50 mg per tablet 1 Tab  1 Tab Oral DAILY   ??? insulin regular (NOVOLIN, HUMULIN) injection 8 Units  8 Units SubCUTAneous ONCE   ??? dextrose (D50W) injection Syrg 25 g  25 g IntraVENous ONCE   ??? calcium gluconate 1 g in 0.9% sodium chloride 100 mL IVPB  1 g IntraVENous ONCE   ??? folic acid (FOLVITE) tablet 1 mg  1 mg Oral DAILY   ??? labetalol (NORMODYNE;TRANDATE) 20 mg/4 mL (5 mg/mL) injection 20 mg  20 mg IntraVENous PRN   ??? hydroxyurea (HYDREA) chemo cap 500 mg  500 mg Oral DIALYSIS MON, WED, FRI   ??? insulin glargine (LANTUS) injection 4 Units  4 Units SubCUTAneous QHS   ??? dextrose 5 % - 0.45% NaCl infusion  50 mL/hr IntraVENous CONTINUOUS   ??? heparin (porcine) injection 5,000 Units  5,000 Units SubCUTAneous Q8H   ??? HYDROmorphone (DILAUDID) injection 4  mg  4 mg IntraVENous ONCE   ??? levofloxacin (LEVAQUIN) 750 mg in D5W IVPB  750 mg IntraVENous ONCE   ??? levofloxacin (LEVAQUIN) 500 mg in D5W IVPB  500 mg IntraVENous Q48H   ??? diphenhydrAMINE (BENADRYL) injection 12.5 mg  12.5 mg IntraVENous Q6H PRN   ???  HYDROmorphone (DILAUDID) injection 4 mg  4 mg IntraVENous Q2H PRN   ??? amLODIPine (NORVASC) tablet 10 mg  10 mg Oral CUSTOM DAYS & TIMES   ??? amLODIPine (NORVASC) tablet 10 mg  10 mg Oral CUSTOM DAYS & TIMES   ??? B complex-vitaminC-folic acid (NEPHROCAP) cap  1 Cap Oral DAILY            Lab Review:     Recent Labs   Basename 06/28/12 1800    WBC 3.3*    HGB 8.8*    HCT 27.6*    PLT 161     Recent Labs   Basename 06/28/12 1800    NA 135*    K 6.1*    CL 99    CO2 33*    GLU 97    BUN 40*    CREA 6.37*    CA 8.6    MG --    PHOS --    ALB 2.8*    TBIL 0.4    SGOT 24    INR --     Lab Results   Component Value Date/Time    POC GLUCOSE 133 06/19/2012  6:17 AM    POC GLUCOSE 130 06/18/2012  8:58 PM    POC GLUCOSE 132 06/18/2012  4:59 PM    POC GLUCOSE 94 06/18/2012 12:09 PM    POC GLUCOSE 106 06/18/2012  6:13 AM     No results found for this basename: PH:3,PCO2:3,PO2:3,HCO3:3,FIO2:3 in the last 72 hours  No results found for this basename: INR:3 in the last 72 hours    Lab Results   Component Value Date/Time    Specimen Description: BLOOD 06/16/2012 10:12 PM    Specimen Description: BLOOD 05/24/2012 10:47 AM     Lab Results   Component Value Date/Time    Culture result: NO GROWTH 5 DAYS 06/16/2012 10:12 PM    Culture result: NO GROWTH 5 DAYS 05/24/2012 10:47 AM                     Total time spent with patient: 5                  Care Plan discussed with: Patient, Nursing Staff and >50% of time spent in counseling and coordination of care    Discussed:  Care Plan    Prophylaxis:  Lovenox    Disposition:  Home w/Family           ___________________________________________________    Attending Physician: Cleotis Nipper, MD

## 2012-06-29 NOTE — Other (Signed)
07/[id number]  22:00  TRANSFER - IN REPORT:    Verbal report received from [facility]) on [employer]  being received from ED(unit) for routine progression of care.  Report received at bedside      Report consisted of patient???s Situation, Background, Assessment and   Recommendations(SBAR).     Information from the following report(s) SBAR, Kardex, ED Summary, [facility] and Recent Results was reviewed with the receiving nurse.    Opportunity for questions and clarification was provided.      Assessment completed upon patient???s arrival to unit and care assumed.

## 2012-06-29 NOTE — Progress Notes (Signed)
Mr. Sciascia is followed by the [facility]. Will defer renal care to Dr. Sherrine Maples.

## 2012-06-29 NOTE — Progress Notes (Addendum)
1550 - Received pt, on HD, tolerating.  1655 - Received report from HD RN, pt recently received pain medication, no other complaints, assessment completed, VSS.  Will monitor.   1800 - Pt medicated for pain.  2000 - Pt ambulating in hallway, medicated for pain.    2200 - Pain medication given, remainder of medications given.  Blood sugar 97.  Pt ambulating off unit, reminded pt to remain on floor for walks.  Pt refusing heparin, educated pt on use.  Will monitor   2300 - Report given.

## 2012-06-30 LAB — GLUCOSE, POC
Glucose (POC): 97 mg/dL (ref 75–110)
Glucose (POC): 91 mg/dL (ref 75–110)
Glucose (POC): 105 mg/dL (ref 75–110)
Glucose (POC): 91 mg/dL (ref 75–110)

## 2012-06-30 LAB — CBC WITH AUTOMATED DIFF
ABS. BASOPHILS: 0 10*3/uL (ref 0.0–0.1)
ABS. EOSINOPHILS: 0.4 10*3/uL (ref 0.0–0.4)
ABS. LYMPHOCYTES: 0.9 10*3/uL (ref 0.8–3.5)
ABS. MONOCYTES: 0.5 10*3/uL (ref 0.0–1.0)
ABS. NEUTROPHILS: 1.1 10*3/uL — ABNORMAL LOW (ref 1.8–8.0)
BASOPHILS: 1 % (ref 0–1)
EOSINOPHILS: 14 % — ABNORMAL HIGH (ref 0–7)
HCT: 28.7 % — ABNORMAL LOW (ref 36.6–50.3)
HGB: 8.9 g/dL — ABNORMAL LOW (ref 12.1–17.0)
LYMPHOCYTES: 31 % (ref 12–49)
MCH: 30.4 PG (ref 26.0–34.0)
MCHC: 31 g/dL (ref 30.0–36.5)
MCV: 98 FL (ref 80.0–99.0)
MONOCYTES: 17 % — ABNORMAL HIGH (ref 5–13)
NEUTROPHILS: 37 % (ref 32–75)
PLATELET: 156 10*3/uL (ref 150–400)
RBC: 2.93 M/uL — ABNORMAL LOW (ref 4.10–5.70)
RDW: 16 % — ABNORMAL HIGH (ref 11.5–14.5)
WBC: 3 10*3/uL — ABNORMAL LOW (ref 4.1–11.1)

## 2012-06-30 LAB — METABOLIC PANEL, BASIC
Anion gap: 6 mmol/L (ref 5–15)
BUN/Creatinine ratio: 6 — ABNORMAL LOW (ref 12–20)
BUN: 35 MG/DL — ABNORMAL HIGH (ref 6–20)
CO2: 28 MMOL/L (ref 21–32)
Calcium: 9 MG/DL (ref 8.5–10.1)
Chloride: 98 MMOL/L (ref 97–108)
Creatinine: 5.4 MG/DL — ABNORMAL HIGH (ref 0.45–1.15)
GFR est AA: 15 mL/min/{1.73_m2} — ABNORMAL LOW (ref >60)
GFR est non-AA: 12 mL/min/{1.73_m2} — ABNORMAL LOW (ref >60)
Glucose: 92 MG/DL (ref 65–100)
Potassium: 5.6 MMOL/L — ABNORMAL HIGH (ref 3.5–5.1)
Sodium: 132 MMOL/L — ABNORMAL LOW (ref 136–145)

## 2012-06-30 LAB — MAGNESIUM: Magnesium: 2 MG/DL (ref 1.6–2.4)

## 2012-06-30 MED ADMIN — HYDROmorphone (DILAUDID) injection 4 mg: INTRAVENOUS | @ 14:00:00 | NDC 00641012121

## 2012-06-30 MED ADMIN — HYDROmorphone (DILAUDID) injection 4 mg: INTRAVENOUS | @ 22:00:00 | NDC 00641012121

## 2012-06-30 MED ADMIN — sodium chloride (NS) flush 5-10 mL: INTRAVENOUS | @ 09:00:00 | NDC 87701099893

## 2012-06-30 MED ADMIN — ondansetron (ZOFRAN) injection 4 mg: INTRAVENOUS | @ 18:00:00 | NDC 00409475503

## 2012-06-30 MED ADMIN — heparin (porcine) injection 5,000 Units: SUBCUTANEOUS | @ 18:00:00 | NDC 00409272301

## 2012-06-30 MED ADMIN — B complex-vitaminC-folic acid (NEPHROCAP) cap: ORAL | @ 14:00:00 | NDC 70199002611

## 2012-06-30 MED ADMIN — ondansetron (ZOFRAN) injection 4 mg: INTRAVENOUS | @ 14:00:00 | NDC 00409475503

## 2012-06-30 MED ADMIN — folic acid (FOLVITE) tablet 1 mg: ORAL | @ 14:00:00 | NDC 00603316232

## 2012-06-30 MED ADMIN — amLODIPine (NORVASC) tablet 10 mg: ORAL | @ 02:00:00 | NDC 59762153002

## 2012-06-30 MED ADMIN — diphenhydrAMINE (BENADRYL) injection 12.5 mg: INTRAVENOUS | @ 20:00:00 | NDC 63323066401

## 2012-06-30 MED ADMIN — ondansetron (ZOFRAN) injection 4 mg: INTRAVENOUS | @ 02:00:00 | NDC 00409475503

## 2012-06-30 MED ADMIN — HYDROmorphone (DILAUDID) injection 4 mg: INTRAVENOUS | @ 12:00:00 | NDC 00641012121

## 2012-06-30 MED ADMIN — HYDROmorphone (DILAUDID) injection 4 mg: INTRAVENOUS | @ 18:00:00 | NDC 00641012121

## 2012-06-30 MED ADMIN — calcium acetate (PHOSLO) capsule 1,334 mg: ORAL | @ 18:00:00 | NDC 00054008826

## 2012-06-30 MED ADMIN — diphenhydrAMINE (BENADRYL) injection 12.5 mg: INTRAVENOUS | @ 14:00:00 | NDC 00641037621

## 2012-06-30 MED ADMIN — metoprolol (LOPRESSOR) tablet 100 mg: ORAL | @ 02:00:00 | NDC 57664050658

## 2012-06-30 MED ADMIN — sodium chloride (NS) flush 5-10 mL: INTRAVENOUS | @ 18:00:00 | NDC 82903065462

## 2012-06-30 MED ADMIN — HYDROmorphone (DILAUDID) injection 4 mg: INTRAVENOUS | @ 06:00:00 | NDC 00641012121

## 2012-06-30 MED ADMIN — calcium acetate (PHOSLO) capsule 1,334 mg: ORAL | @ 21:00:00 | NDC 00054008826

## 2012-06-30 MED ADMIN — sodium chloride (NS) flush 5-10 mL: INTRAVENOUS | @ 02:00:00 | NDC 87701099893

## 2012-06-30 MED ADMIN — HYDROmorphone (DILAUDID) injection 4 mg: INTRAVENOUS | @ 02:00:00 | NDC 00641012121

## 2012-06-30 MED ADMIN — heparin (porcine) injection 5,000 Units: SUBCUTANEOUS | @ 09:00:00 | NDC 00409272301

## 2012-06-30 MED ADMIN — HYDROmorphone (DILAUDID) injection 4 mg: INTRAVENOUS | @ 16:00:00 | NDC 00641012121

## 2012-06-30 MED ADMIN — calcium acetate (PHOSLO) capsule 1,334 mg: ORAL | @ 14:00:00 | NDC 00054008826

## 2012-06-30 MED ADMIN — metoprolol (LOPRESSOR) tablet 100 mg: ORAL | @ 14:00:00 | NDC 57664050658

## 2012-06-30 MED ADMIN — HYDROmorphone (DILAUDID) injection 4 mg: INTRAVENOUS | NDC 00641012121

## 2012-06-30 MED ADMIN — HYDROmorphone (DILAUDID) injection 4 mg: INTRAVENOUS | @ 20:00:00 | NDC 00641012121

## 2012-06-30 MED ADMIN — diphenhydrAMINE (BENADRYL) injection 12.5 mg: INTRAVENOUS | NDC 00641037621

## 2012-06-30 MED ADMIN — amLODIPine (NORVASC) tablet 10 mg: ORAL | @ 14:00:00 | NDC 59762153002

## 2012-06-30 MED ADMIN — HYDROmorphone (DILAUDID) injection 4 mg: INTRAVENOUS | @ 04:00:00 | NDC 00641012121

## 2012-06-30 MED FILL — HYDROMORPHONE 2 MG/ML INJECTION SOLUTION: 2 mg/mL | INTRAMUSCULAR | Qty: 2

## 2012-06-30 MED FILL — CALCIUM ACETATE 667 MG CAP: 667 mg | ORAL | Qty: 2

## 2012-06-30 MED FILL — METOPROLOL TARTRATE 25 MG TAB: 25 mg | ORAL | Qty: 4

## 2012-06-30 MED FILL — DIPHENHYDRAMINE HCL 50 MG/ML IJ SOLN: 50 mg/mL | INTRAMUSCULAR | Qty: 1

## 2012-06-30 MED FILL — ONDANSETRON (PF) 4 MG/2 ML INJECTION: 4 mg/2 mL | INTRAMUSCULAR | Qty: 2

## 2012-06-30 MED FILL — RENAL CAPS 1 MG CAPSULE: 1 mg | ORAL | Qty: 1

## 2012-06-30 MED FILL — AMLODIPINE 5 MG TAB: 5 mg | ORAL | Qty: 2

## 2012-06-30 MED FILL — FOLIC ACID 1 MG TAB: 1 mg | ORAL | Qty: 1

## 2012-06-30 MED FILL — INSULIN GLARGINE 100 UNIT/ML INJECTION: 100 unit/mL | SUBCUTANEOUS | Qty: 0.04

## 2012-06-30 MED FILL — HEPARIN (PORCINE) 5,000 UNIT/ML IJ SOLN: 5000 unit/mL | INTRAMUSCULAR | Qty: 1

## 2012-06-30 NOTE — Progress Notes (Signed)
Bedside and Verbal shift change report given to Stanton Kidney RN (oncoming nurse) by Trinna Post (offgoing nurse). Report given with SBAR, Kardex, Intake/Output, MAR, Accordion and Recent Results

## 2012-06-30 NOTE — Progress Notes (Addendum)
Two RN's tried to AM labs and were unsuccessful. Phlebotomy not here this AM.

## 2012-06-30 NOTE — Progress Notes (Signed)
0900-Patient refuses Lantus this am. He states that his blood glucose is to low to take it.

## 2012-06-30 NOTE — Progress Notes (Signed)
Bedside and Verbal shift change report given to Tanya,RN (oncoming nurse) by Debra,RN (offgoing nurse).  Report given with SBAR, Kardex, MAR and Recent Results.

## 2012-06-30 NOTE — Progress Notes (Signed)
Medical Progress Note      NAME: Tyler Ballard   DOB:  09/09/1981  MRM:  [id number]    Date/Time: 06/30/2012        Principal Problem:   *Sickle cell crisis (05/24/2012)  Active Problems:     Hyperkalemia (06/28/2012)     ESRD on hemodialysis (06/28/2012)     Unspecified essential hypertension (06/28/2012)     Type II or unspecified type diabetes mellitus with renal manifestations, not stated as uncontrolled (06/28/2012)        Assessment/Plan:   1.  Sickle cell crisis (05/24/2012) - unclear trigger. Pt still with persistent cough. No fevers. No WBC count but ?bronchitis.   -dilaudid 4mg  IV q2h PRN   -type and screen in the event of transfusion   -continue O2   -bowel regimen considering high narcotic use   -start folic acid   -continue home hydroxyurea 500mg  after HD   -will continue levofloxacin for now to complete course of therapy for ?bronchitis though expect this is resolving considering no WBC count or fevers     2.  Hyperkalemia (06/28/2012) - per pt, on HD MWF. Has not missed any sessions. S/p kayexalate, insulin/D50 and calcium gluconate in the ED   -monitor on tele   -hold ACE I   -renal consulted for HD appreciated.  - no labs today    3.  ESRD on hemodialysis (06/28/2012) - on HD MWF   -renal consulted for HD     4.  Unspecified essential hypertension (06/28/2012)   -continue amlodipine and metoprolol   -hold ACE I due to hyperK+     5.  Type II or unspecified type diabetes mellitus with renal manifestations, not stated as uncontrolled (06/28/2012)   -ISS   -BG checks AC TID and qHS   -Lantus 4u qHS   -diabetic diet   - BS is well controlled.          Subjective:     Chief Complaint: still in pain    ROS:  (bold if positive, if negative)      Tolerating PT  Tolerating Diet          Objective:       Vitals:          Last 24hrs VS reviewed since prior progress note. Most recent are:    Visit Vitals   Item Reading   ??? BP 144/79   ??? Pulse 66   ??? Temp 98.1 ??F (36.7 ??C)   ??? Resp 18   ??? Ht 5\' 5"  (1.651 m)   ??? Wt  203 lb 0.7 oz   ??? BMI 33.79 kg/m2   ??? SpO2 100%     SpO2 Readings from Last 6 Encounters:   06/30/12 100%   06/19/12 99%   06/12/12 96%    O2 Flow Rate (L/min): 2 l/min       Intake/Output Summary (Last 24 hours) at 06/30/12 0728  Last data filed at 06/29/12 1600   Gross per 24 hour   Intake      0 ml   Output   5500 ml   Net  -5500 ml          Exam:     Physical Exam:    Gen:  Well-developed, well-nourished, in no acute distress  HEENT:  Pink conjunctivae, PERRL, hearing intact to voice, moist mucous membranes  Neck:  Supple, without masses, thyroid non-tender  Resp:  No accessory muscle use, clear breath sounds without wheezes  rales or rhonchi  Card:  No murmurs, normal S1, S2 without thrills, bruits or peripheral edema  Abd:  Soft, non-tender, non-distended, normoactive bowel sounds are present, no palpable organomegaly and no detectable hernias  Lymph:  No cervical or inguinal adenopathy  Musc:  No cyanosis or clubbing  Skin:  No rashes or ulcers, skin turgor is good  Neuro:  Cranial nerves are grossly intact, no focal motor weakness, follows commands appropriately  Psych:  Good insight, oriented to person, place and time, alert           Medications Reviewed: (see below)    Lab Data Reviewed: (see below)    ______________________________________________________________________    Medications:     Current Facility-Administered Medications   Medication Dose Route Frequency   ??? epoetin alfa (EPOGEN;PROCRIT) injection 15,000 Units  15,000 Units IntraVENous Q MON, WED & FRI   ??? insulin glargine (LANTUS) injection 4 Units  4 Units SubCUTAneous DAILY   ??? calcium acetate (PHOSLO) capsule 1,334 mg  2 Cap Oral TID WITH MEALS   ??? metoprolol (LOPRESSOR) tablet 100 mg  100 mg Oral BID   ??? sodium chloride (NS) flush 5-10 mL  5-10 mL IntraVENous Q8H   ??? sodium chloride (NS) flush 5-10 mL  5-10 mL IntraVENous PRN   ??? acetaminophen (TYLENOL) tablet 650 mg  650 mg Oral Q4H PRN   ??? oxyCODONE-acetaminophen (PERCOCET 10) 10-325 mg  per tablet 1 Tab  1 Tab Oral Q4H PRN   ??? naloxone (NARCAN) injection 0.4 mg  0.4 mg IntraVENous PRN   ??? ondansetron (ZOFRAN) injection 4 mg  4 mg IntraVENous Q4H PRN   ??? insulin lispro (HUMALOG) injection   SubCUTAneous AC&HS   ??? glucose chewable tablet 16 g  4 Tab Oral PRN   ??? dextrose (D50W) injection Syrg 12.5-25 g  12.5-25 g IntraVENous PRN   ??? glucagon (GLUCAGEN) injection 1 mg  1 mg IntraMUSCular PRN   ??? polyethylene glycol (MIRALAX) packet 17 g  17 g Oral DAILY   ??? senna-docusate (PERICOLACE) 8.6-50 mg per tablet 1 Tab  1 Tab Oral DAILY   ??? folic acid (FOLVITE) tablet 1 mg  1 mg Oral DAILY   ??? labetalol (NORMODYNE;TRANDATE) 20 mg/4 mL (5 mg/mL) injection 20 mg  20 mg IntraVENous PRN   ??? hydroxyurea (HYDREA) chemo cap 500 mg  500 mg Oral DIALYSIS MON, WED, FRI   ??? heparin (porcine) injection 5,000 Units  5,000 Units SubCUTAneous Q8H   ??? levofloxacin (LEVAQUIN) 500 mg in D5W IVPB  500 mg IntraVENous Q48H   ??? diphenhydrAMINE (BENADRYL) injection 12.5 mg  12.5 mg IntraVENous Q6H PRN   ??? HYDROmorphone (DILAUDID) injection 4 mg  4 mg IntraVENous Q2H PRN   ??? amLODIPine (NORVASC) tablet 10 mg  10 mg Oral CUSTOM DAYS & TIMES   ??? amLODIPine (NORVASC) tablet 10 mg  10 mg Oral CUSTOM DAYS & TIMES   ??? B complex-vitaminC-folic acid (NEPHROCAP) cap  1 Cap Oral DAILY            Lab Review:     Recent Labs   Basename 06/28/12 1800    WBC 3.3*    HGB 8.8*    HCT 27.6*    PLT 161     Recent Labs   Basename 06/28/12 1800    NA 135*    K 6.1*    CL 99    CO2 33*    GLU 97    BUN 40*    CREA  6.37*    CA 8.6    MG --    PHOS --    ALB 2.8*    TBIL 0.4    SGOT 24    INR --     Lab Results   Component Value Date/Time    POC GLUCOSE 97 06/29/2012  9:56 PM    POC GLUCOSE 104 06/29/2012  4:06 PM    POC GLUCOSE 120 06/29/2012 11:14 AM    POC GLUCOSE 120 06/29/2012  7:22 AM    POC GLUCOSE 133 06/19/2012  6:17 AM     No results found for this basename: PH:3,PCO2:3,PO2:3,HCO3:3,FIO2:3 in the last 72 hours  No results found for this basename:  INR:3 in the last 72 hours    Lab Results   Component Value Date/Time    Specimen Description: BLOOD 06/16/2012 10:12 PM    Specimen Description: BLOOD 05/24/2012 10:47 AM     Lab Results   Component Value Date/Time    Culture result: NO GROWTH 5 DAYS 06/16/2012 10:12 PM    Culture result: NO GROWTH 5 DAYS 05/24/2012 10:47 AM                     Total time spent with patient: 7                  Care Plan discussed with: Patient, Nursing Staff and >50% of time spent in counseling and coordination of care    Discussed:  Care Plan    Prophylaxis:  Lovenox    Disposition:  Home w/Family           ___________________________________________________    Attending Physician: Cleotis Nipper, MD

## 2012-07-01 LAB — CBC WITH AUTOMATED DIFF
ABS. BASOPHILS: 0 10*3/uL (ref 0.0–0.1)
ABS. EOSINOPHILS: 0.5 10*3/uL — ABNORMAL HIGH (ref 0.0–0.4)
ABS. LYMPHOCYTES: 1.1 10*3/uL (ref 0.8–3.5)
ABS. MONOCYTES: 0.5 10*3/uL (ref 0.0–1.0)
ABS. NEUTROPHILS: 1.4 10*3/uL — ABNORMAL LOW (ref 1.8–8.0)
BASOPHILS: 1 % (ref 0–1)
EOSINOPHILS: 14 % — ABNORMAL HIGH (ref 0–7)
HCT: 26.9 % — ABNORMAL LOW (ref 36.6–50.3)
HGB: 8.4 g/dL — ABNORMAL LOW (ref 12.1–17.0)
LYMPHOCYTES: 31 % (ref 12–49)
MCH: 30.4 PG (ref 26.0–34.0)
MCHC: 31.2 g/dL (ref 30.0–36.5)
MCV: 97.5 FL (ref 80.0–99.0)
MONOCYTES: 15 % — ABNORMAL HIGH (ref 5–13)
NEUTROPHILS: 39 % (ref 32–75)
PLATELET: 174 10*3/uL (ref 150–400)
RBC: 2.76 M/uL — ABNORMAL LOW (ref 4.10–5.70)
RDW: 15.9 % — ABNORMAL HIGH (ref 11.5–14.5)
WBC: 3.5 10*3/uL — ABNORMAL LOW (ref 4.1–11.1)

## 2012-07-01 LAB — GLUCOSE, POC
Glucose (POC): 101 mg/dL (ref 75–110)
Glucose (POC): 117 mg/dL — ABNORMAL HIGH (ref 75–110)
Glucose (POC): 121 mg/dL — ABNORMAL HIGH (ref 75–110)
Glucose (POC): 89 mg/dL (ref 75–110)

## 2012-07-01 LAB — METABOLIC PANEL, BASIC
Anion gap: 9 mmol/L (ref 5–15)
BUN/Creatinine ratio: 7 — ABNORMAL LOW (ref 12–20)
BUN: 47 MG/DL — ABNORMAL HIGH (ref 6–20)
CO2: 30 MMOL/L (ref 21–32)
Calcium: 8.7 MG/DL (ref 8.5–10.1)
Chloride: 97 MMOL/L (ref 97–108)
Creatinine: 6.74 MG/DL — ABNORMAL HIGH (ref 0.45–1.15)
GFR est AA: 12 mL/min/{1.73_m2} — ABNORMAL LOW (ref >60)
GFR est non-AA: 10 mL/min/{1.73_m2} — ABNORMAL LOW (ref >60)
Glucose: 92 MG/DL (ref 65–100)
Potassium: 5.6 MMOL/L — ABNORMAL HIGH (ref 3.5–5.1)
Sodium: 136 MMOL/L (ref 136–145)

## 2012-07-01 MED ADMIN — HYDROmorphone (DILAUDID) injection 4 mg: INTRAVENOUS | @ 07:00:00 | NDC 00641012121

## 2012-07-01 MED ADMIN — HYDROmorphone (DILAUDID) injection 4 mg: INTRAVENOUS | @ 22:00:00 | NDC 00641012121

## 2012-07-01 MED ADMIN — ondansetron (ZOFRAN) injection 4 mg: INTRAVENOUS | @ 18:00:00 | NDC 00641607801

## 2012-07-01 MED ADMIN — insulin glargine (LANTUS) injection 4 Units: SUBCUTANEOUS | @ 14:00:00 | NDC 09999222001

## 2012-07-01 MED ADMIN — sodium chloride (NS) flush 5-10 mL: INTRAVENOUS | @ 09:00:00 | NDC 87701099893

## 2012-07-01 MED ADMIN — metoprolol (LOPRESSOR) tablet 100 mg: ORAL | @ 13:00:00 | NDC 57664050658

## 2012-07-01 MED ADMIN — heparin (porcine) injection 5,000 Units: SUBCUTANEOUS | @ 18:00:00 | NDC 00409272301

## 2012-07-01 MED ADMIN — sodium polystyrene (KAYEXALATE) 15 gram/60 mL oral suspension 30 g: ORAL | @ 13:00:00 | NDC 46287000660

## 2012-07-01 MED ADMIN — HYDROmorphone (DILAUDID) injection 4 mg: INTRAVENOUS | @ 09:00:00 | NDC 00641012121

## 2012-07-01 MED ADMIN — sodium chloride (NS) flush 5-10 mL: INTRAVENOUS | @ 18:00:00 | NDC 82903065462

## 2012-07-01 MED ADMIN — HYDROmorphone (DILAUDID) injection 4 mg: INTRAVENOUS | NDC 00641012121

## 2012-07-01 MED ADMIN — sodium chloride (NS) flush 5-10 mL: INTRAVENOUS | @ 02:00:00 | NDC 87701099893

## 2012-07-01 MED ADMIN — HYDROmorphone (DILAUDID) injection 4 mg: INTRAVENOUS | @ 18:00:00 | NDC 00641012121

## 2012-07-01 MED ADMIN — HYDROmorphone (DILAUDID) injection 4 mg: INTRAVENOUS | @ 20:00:00 | NDC 00641012121

## 2012-07-01 MED ADMIN — calcium acetate (PHOSLO) capsule 1,334 mg: ORAL | @ 13:00:00 | NDC 00054008826

## 2012-07-01 MED ADMIN — HYDROmorphone (DILAUDID) injection 4 mg: INTRAVENOUS | @ 14:00:00 | NDC 00641012121

## 2012-07-01 MED ADMIN — folic acid (FOLVITE) tablet 1 mg: ORAL | @ 14:00:00 | NDC 00603316232

## 2012-07-01 MED ADMIN — levofloxacin (LEVAQUIN) 500 mg in D5W IVPB: INTRAVENOUS | @ 02:00:00 | NDC 50458016801

## 2012-07-01 MED ADMIN — diphenhydrAMINE (BENADRYL) injection 12.5 mg: INTRAVENOUS | @ 08:00:00 | NDC 63323066401

## 2012-07-01 MED ADMIN — metoprolol (LOPRESSOR) tablet 100 mg: ORAL | @ 02:00:00 | NDC 57664050658

## 2012-07-01 MED ADMIN — heparin (porcine) injection 5,000 Units: SUBCUTANEOUS | @ 02:00:00 | NDC 00409272301

## 2012-07-01 MED ADMIN — diphenhydrAMINE (BENADRYL) injection 12.5 mg: INTRAVENOUS | @ 14:00:00 | NDC 63323066401

## 2012-07-01 MED ADMIN — HYDROmorphone (DILAUDID) injection 4 mg: INTRAVENOUS | @ 12:00:00 | NDC 00641012121

## 2012-07-01 MED ADMIN — calcium acetate (PHOSLO) capsule 1,334 mg: ORAL | @ 16:00:00 | NDC 00054008826

## 2012-07-01 MED ADMIN — HYDROmorphone (DILAUDID) injection 4 mg: INTRAVENOUS | @ 02:00:00 | NDC 00641012121

## 2012-07-01 MED ADMIN — HYDROmorphone (DILAUDID) injection 4 mg: INTRAVENOUS | @ 16:00:00 | NDC 00641012121

## 2012-07-01 MED ADMIN — heparin (porcine) injection 5,000 Units: SUBCUTANEOUS | @ 09:00:00 | NDC 00409272301

## 2012-07-01 MED ADMIN — amLODIPine (NORVASC) tablet 10 mg: ORAL | @ 13:00:00 | NDC 59762153002

## 2012-07-01 MED ADMIN — diphenhydrAMINE (BENADRYL) injection 12.5 mg: INTRAVENOUS | @ 20:00:00 | NDC 63323066401

## 2012-07-01 MED ADMIN — calcium acetate (PHOSLO) capsule 1,334 mg: ORAL | @ 20:00:00 | NDC 00054008826

## 2012-07-01 MED ADMIN — ondansetron (ZOFRAN) injection 4 mg: INTRAVENOUS | @ 22:00:00 | NDC 00641607801

## 2012-07-01 MED ADMIN — senna-docusate (PERICOLACE) 8.6-50 mg per tablet 1 Tab: ORAL | @ 14:00:00 | NDC 68084005011

## 2012-07-01 MED FILL — HEPARIN (PORCINE) 5,000 UNIT/ML IJ SOLN: 5000 unit/mL | INTRAMUSCULAR | Qty: 1

## 2012-07-01 MED FILL — SENNA PLUS 8.6 MG-50 MG TABLET: ORAL | Qty: 1

## 2012-07-01 MED FILL — HYDROMORPHONE 2 MG/ML INJECTION SOLUTION: 2 mg/mL | INTRAMUSCULAR | Qty: 2

## 2012-07-01 MED FILL — LEVAQUIN 500 MG/100 ML IN 5% DEXTROSE INTRAVENOUS PIGGYBACK: 500 mg/100 mL | INTRAVENOUS | Qty: 100

## 2012-07-01 MED FILL — CALCIUM ACETATE 667 MG CAP: 667 mg | ORAL | Qty: 2

## 2012-07-01 MED FILL — FOLIC ACID 1 MG TAB: 1 mg | ORAL | Qty: 1

## 2012-07-01 MED FILL — AMLODIPINE 5 MG TAB: 5 mg | ORAL | Qty: 2

## 2012-07-01 MED FILL — HYDROXYUREA 500 MG CAPSULE: 500 mg | ORAL | Qty: 1

## 2012-07-01 MED FILL — ONDANSETRON (PF) 4 MG/2 ML INJECTION: 4 mg/2 mL | INTRAMUSCULAR | Qty: 2

## 2012-07-01 MED FILL — DIPHENHYDRAMINE HCL 50 MG/ML IJ SOLN: 50 mg/mL | INTRAMUSCULAR | Qty: 1

## 2012-07-01 MED FILL — SPS (WITH SORBITOL) 15 GRAM-20 GRAM/60 ML ORAL SUSPENSION: 15-20 gram/60 mL | ORAL | Qty: 120

## 2012-07-01 MED FILL — METOPROLOL TARTRATE 25 MG TAB: 25 mg | ORAL | Qty: 4

## 2012-07-01 MED FILL — INSULIN GLARGINE 100 UNIT/ML INJECTION: 100 unit/mL | SUBCUTANEOUS | Qty: 0.04

## 2012-07-01 NOTE — Progress Notes (Signed)
Medical Progress Note      NAME: Tyler Ballard   DOB:  01-Dec-1981  MRM:  [id number]    Date/Time: 07/01/2012        Principal Problem:   *Sickle cell crisis (05/24/2012)  Active Problems:     Hyperkalemia (06/28/2012)     ESRD on hemodialysis (06/28/2012)     Unspecified essential hypertension (06/28/2012)     Type II or unspecified type diabetes mellitus with renal manifestations, not stated as uncontrolled (06/28/2012)        Assessment/Plan:   1.  Sickle cell crisis (05/24/2012) - unclear trigger. Pt still with persistent cough. No fevers. No WBC count but ?bronchitis.   -dilaudid 4mg  IV q2h PRN. Transition to PO tomorrow  -type and screen in the event of transfusion   -continue O2   -bowel regimen considering high narcotic use   -start folic acid   -continue home hydroxyurea 500mg  after HD   -will continue levofloxacin for now to complete course of therapy for ?bronchitis though expect this is resolving considering no WBC count or fevers     2.  Hyperkalemia (06/28/2012) - per pt, on HD MWF. Has not missed any sessions.  kayexalate  -monitor on tele   -hold ACE I   -renal consulted for HD appreciated.     3.  ESRD on hemodialysis (06/28/2012) - on HD MWF   -renal consulted for HD     4.  Unspecified essential hypertension (06/28/2012)   -continue amlodipine and metoprolol   -hold ACE I due to hyperK+     5.  Type II or unspecified type diabetes mellitus with renal manifestations, not stated as uncontrolled (06/28/2012)   -ISS   -BG checks AC TID and qHS   -Lantus 4u qHS   -diabetic diet   - BS is well controlled.          Subjective:     Chief Complaint: still in pain    ROS:  (bold if positive, if negative)      Tolerating PT  Tolerating Diet          Objective:       Vitals:          Last 24hrs VS reviewed since prior progress note. Most recent are:    Visit Vitals   Item Reading   ??? BP 131/74   ??? Pulse 67   ??? Temp 97.9 ??F (36.6 ??C)   ??? Resp 18   ??? Ht 5\' 5"  (1.651 m)   ??? Wt 203 lb 0.7 oz   ??? BMI 33.79 kg/m2   ???  SpO2 100%     SpO2 Readings from Last 6 Encounters:   07/01/12 100%   06/19/12 99%   06/12/12 96%    O2 Flow Rate (L/min): 2 l/min       Intake/Output Summary (Last 24 hours) at 07/01/12 0739  Last data filed at 06/30/12 2045   Gross per 24 hour   Intake    240 ml   Output      0 ml   Net    240 ml          Exam:     Physical Exam:    Gen:  Well-developed, well-nourished, in no acute distress  HEENT:  Pink conjunctivae, PERRL, hearing intact to voice, moist mucous membranes  Neck:  Supple, without masses, thyroid non-tender  Resp:  No accessory muscle use, clear breath sounds without wheezes rales or rhonchi  Card:  No murmurs, normal S1, S2 without thrills, bruits or peripheral edema  Abd:  Soft, non-tender, non-distended, normoactive bowel sounds are present, no palpable organomegaly and no detectable hernias  Lymph:  No cervical or inguinal adenopathy  Musc:  No cyanosis or clubbing  Skin:  No rashes or ulcers, skin turgor is good  Neuro:  Cranial nerves are grossly intact, no focal motor weakness, follows commands appropriately  Psych:  Good insight, oriented to person, place and time, alert           Medications Reviewed: (see below)    Lab Data Reviewed: (see below)    ______________________________________________________________________    Medications:     Current Facility-Administered Medications   Medication Dose Route Frequency   ??? sodium polystyrene (KAYEXALATE) 15 gram/60 mL oral suspension 30 g  30 g Oral NOW   ??? sodium polystyrene (KAYEXALATE) 15 gram/60 mL oral suspension 30 g  30 g Oral NOW   ??? epoetin alfa (EPOGEN;PROCRIT) injection 15,000 Units  15,000 Units IntraVENous Q MON, WED & FRI   ??? insulin glargine (LANTUS) injection 4 Units  4 Units SubCUTAneous DAILY   ??? calcium acetate (PHOSLO) capsule 1,334 mg  2 Cap Oral TID WITH MEALS   ??? metoprolol (LOPRESSOR) tablet 100 mg  100 mg Oral BID   ??? sodium chloride (NS) flush 5-10 mL  5-10 mL IntraVENous Q8H   ??? sodium chloride (NS) flush 5-10 mL  5-10  mL IntraVENous PRN   ??? acetaminophen (TYLENOL) tablet 650 mg  650 mg Oral Q4H PRN   ??? oxyCODONE-acetaminophen (PERCOCET 10) 10-325 mg per tablet 1 Tab  1 Tab Oral Q4H PRN   ??? naloxone (NARCAN) injection 0.4 mg  0.4 mg IntraVENous PRN   ??? ondansetron (ZOFRAN) injection 4 mg  4 mg IntraVENous Q4H PRN   ??? insulin lispro (HUMALOG) injection   SubCUTAneous AC&HS   ??? glucose chewable tablet 16 g  4 Tab Oral PRN   ??? dextrose (D50W) injection Syrg 12.5-25 g  12.5-25 g IntraVENous PRN   ??? glucagon (GLUCAGEN) injection 1 mg  1 mg IntraMUSCular PRN   ??? polyethylene glycol (MIRALAX) packet 17 g  17 g Oral DAILY   ??? senna-docusate (PERICOLACE) 8.6-50 mg per tablet 1 Tab  1 Tab Oral DAILY   ??? folic acid (FOLVITE) tablet 1 mg  1 mg Oral DAILY   ??? labetalol (NORMODYNE;TRANDATE) 20 mg/4 mL (5 mg/mL) injection 20 mg  20 mg IntraVENous PRN   ??? hydroxyurea (HYDREA) chemo cap 500 mg  500 mg Oral DIALYSIS MON, WED, FRI   ??? heparin (porcine) injection 5,000 Units  5,000 Units SubCUTAneous Q8H   ??? levofloxacin (LEVAQUIN) 500 mg in D5W IVPB  500 mg IntraVENous Q48H   ??? diphenhydrAMINE (BENADRYL) injection 12.5 mg  12.5 mg IntraVENous Q6H PRN   ??? HYDROmorphone (DILAUDID) injection 4 mg  4 mg IntraVENous Q2H PRN   ??? amLODIPine (NORVASC) tablet 10 mg  10 mg Oral CUSTOM DAYS & TIMES   ??? amLODIPine (NORVASC) tablet 10 mg  10 mg Oral CUSTOM DAYS & TIMES   ??? B complex-vitaminC-folic acid (NEPHROCAP) cap  1 Cap Oral DAILY            Lab Review:     Recent Labs   [facility] 07/01/12 0403 06/30/12 0755 06/28/12 1800    WBC 3.5* 3.0* 3.3*    HGB 8.4* 8.9* 8.8*    HCT 26.9* 28.7* 27.6*    PLT 174 156 161  Recent Labs   [facility] 07/01/12 0403 06/30/12 0755 06/28/12 1800    NA 136 132* 135*    K 5.6* 5.6* 6.1*    CL 97 98 99    CO2 30 28 33*    GLU 92 92 97    BUN 47* 35* 40*    CREA 6.74* 5.40* 6.37*    CA 8.7 9.0 8.6    MG -- 2.0 --    PHOS -- -- --    ALB -- -- 2.8*    TBIL -- -- 0.4    SGOT -- -- 24    INR -- -- --     Lab Results   Component Value  Date/Time    POC GLUCOSE 121 06/30/2012  8:06 PM    POC GLUCOSE 91 06/30/2012  3:59 PM    POC GLUCOSE 105 06/30/2012 11:18 AM    POC GLUCOSE 91 06/30/2012  7:35 AM    POC GLUCOSE 97 06/29/2012  9:56 PM     No results found for this basename: PH:3,PCO2:3,PO2:3,HCO3:3,FIO2:3 in the last 72 hours  No results found for this basename: INR:3 in the last 72 hours    Lab Results   Component Value Date/Time    Specimen Description: BLOOD 06/16/2012 10:12 PM    Specimen Description: BLOOD 05/24/2012 10:47 AM     Lab Results   Component Value Date/Time    Culture result: NO GROWTH 5 DAYS 06/16/2012 10:12 PM    Culture result: NO GROWTH 5 DAYS 05/24/2012 10:47 AM                     Total time spent with patient: 41                  Care Plan discussed with: Patient, Nursing Staff and >50% of time spent in counseling and coordination of care    Discussed:  Care Plan    Prophylaxis:  Lovenox    Disposition:  Home w/Family           ___________________________________________________    Attending Physician: Cleotis Nipper, MD

## 2012-07-01 NOTE — Progress Notes (Signed)
Bedside and Verbal shift change report given to Albert Bruckner RN (oncoming nurse) by Ron Althaus RN (offgoing nurse).  Report given with SBAR, Kardex, Intake/Output, MAR and Recent Results.

## 2012-07-01 NOTE — Progress Notes (Signed)
Bedside and Verbal shift change report given to Tanya,RN (oncoming nurse) by Debra,RN (offgoing nurse).  Report given with SBAR, Kardex, Intake/Output, MAR and Recent Results.

## 2012-07-01 NOTE — Progress Notes (Signed)
[facility] Dosing Services: 07/01/12    The pharmacist has determined that this patient meets P & T approved criteria for conversion from IV to oral therapy for the following medication:Levofloxacin      The pharmacist has entered the following order for the patient: Levaquin 500 mg po Q 48 hrs  The pharmacist will continue to monitor the patient's status and advise the physician if conversion back to IV therapy is recommended.    Signed CANDICE D DUNCAN, PHARMD Contact information:  [phone]

## 2012-07-02 LAB — METABOLIC PANEL, BASIC
Anion gap: 11 mmol/L (ref 5–15)
BUN/Creatinine ratio: 8 — ABNORMAL LOW (ref 12–20)
BUN: 71 MG/DL — ABNORMAL HIGH (ref 6–20)
CO2: 26 MMOL/L (ref 21–32)
Calcium: 8.8 MG/DL (ref 8.5–10.1)
Chloride: 93 MMOL/L — ABNORMAL LOW (ref 97–108)
Creatinine: 8.42 MG/DL — ABNORMAL HIGH (ref 0.45–1.15)
GFR est AA: 9 mL/min/{1.73_m2} — ABNORMAL LOW (ref >60)
GFR est non-AA: 7 mL/min/{1.73_m2} — ABNORMAL LOW (ref >60)
Glucose: 96 MG/DL (ref 65–100)
Potassium: 6.3 MMOL/L — ABNORMAL HIGH (ref 3.5–5.1)
Sodium: 130 MMOL/L — ABNORMAL LOW (ref 136–145)

## 2012-07-02 LAB — CBC WITH AUTOMATED DIFF
ABS. BASOPHILS: 0 10*3/uL (ref 0.0–0.1)
ABS. EOSINOPHILS: 0.6 10*3/uL — ABNORMAL HIGH (ref 0.0–0.4)
ABS. LYMPHOCYTES: 0.9 10*3/uL (ref 0.8–3.5)
ABS. MONOCYTES: 0.3 10*3/uL (ref 0.0–1.0)
ABS. NEUTROPHILS: 1.3 10*3/uL — ABNORMAL LOW (ref 1.8–8.0)
BASOPHILS: 0 % (ref 0–1)
EOSINOPHILS: 18 % — ABNORMAL HIGH (ref 0–7)
HCT: 27 % — ABNORMAL LOW (ref 36.6–50.3)
HGB: 8.6 g/dL — ABNORMAL LOW (ref 12.1–17.0)
LYMPHOCYTES: 29 % (ref 12–49)
MCH: 30.5 PG (ref 26.0–34.0)
MCHC: 31.9 g/dL (ref 30.0–36.5)
MCV: 95.7 FL (ref 80.0–99.0)
MONOCYTES: 11 % (ref 5–13)
NEUTROPHILS: 42 % (ref 32–75)
PLATELET: 156 10*3/uL (ref 150–400)
RBC: 2.82 M/uL — ABNORMAL LOW (ref 4.10–5.70)
RDW: 15.7 % — ABNORMAL HIGH (ref 11.5–14.5)
WBC COMMENTS: REACTIVE
WBC: 3.1 10*3/uL — ABNORMAL LOW (ref 4.1–11.1)

## 2012-07-02 LAB — GLUCOSE, POC
Glucose (POC): 102 mg/dL (ref 75–110)
Glucose (POC): 89 mg/dL (ref 75–110)
Glucose (POC): 96 mg/dL (ref 75–110)
Glucose (POC): 135 mg/dL — ABNORMAL HIGH (ref 75–110)

## 2012-07-02 MED ADMIN — diphenhydrAMINE (BENADRYL) injection 12.5 mg: INTRAVENOUS | @ 07:00:00 | NDC 63323066401

## 2012-07-02 MED ADMIN — diphenhydrAMINE (BENADRYL) injection 12.5 mg: INTRAVENOUS | @ 19:00:00 | NDC 63323066401

## 2012-07-02 MED ADMIN — heparin (porcine) injection 5,000 Units: SUBCUTANEOUS | @ 02:00:00 | NDC 00409272301

## 2012-07-02 MED ADMIN — insulin glargine (LANTUS) injection 4 Units: SUBCUTANEOUS | @ 13:00:00 | NDC 09999222001

## 2012-07-02 MED ADMIN — epoetin alfa (EPOGEN;PROCRIT) injection 15,000 Units: INTRAVENOUS | @ 19:00:00 | NDC 59676031000

## 2012-07-02 MED ADMIN — B complex-vitaminC-folic acid (NEPHROCAP) cap: ORAL | @ 13:00:00 | NDC 70199002611

## 2012-07-02 MED ADMIN — ondansetron (ZOFRAN) injection 4 mg: INTRAVENOUS | @ 13:00:00 | NDC 00409475503

## 2012-07-02 MED ADMIN — HYDROmorphone (DILAUDID) injection 4 mg: INTRAVENOUS | @ 11:00:00 | NDC 00641012121

## 2012-07-02 MED ADMIN — HYDROmorphone (DILAUDID) injection 4 mg: INTRAVENOUS | @ 21:00:00 | NDC 00641012121

## 2012-07-02 MED ADMIN — ondansetron (ZOFRAN) injection 4 mg: INTRAVENOUS | @ 05:00:00 | NDC 00641607801

## 2012-07-02 MED ADMIN — heparin (porcine) injection 5,000 Units: SUBCUTANEOUS | @ 19:00:00 | NDC 00409272301

## 2012-07-02 MED ADMIN — HYDROmorphone (DILAUDID) injection 4 mg: INTRAVENOUS | NDC 00641012121

## 2012-07-02 MED ADMIN — hydroxyurea (HYDREA) chemo cap 500 mg: ORAL | @ 22:00:00 | NDC 49884072401

## 2012-07-02 MED ADMIN — HYDROmorphone (DILAUDID) injection 4 mg: INTRAVENOUS | @ 15:00:00 | NDC 00641012121

## 2012-07-02 MED ADMIN — HYDROmorphone (DILAUDID) injection 4 mg: INTRAVENOUS | @ 05:00:00 | NDC 00641012121

## 2012-07-02 MED ADMIN — sodium chloride (NS) flush 5-10 mL: INTRAVENOUS | @ 11:00:00 | NDC 87701099893

## 2012-07-02 MED ADMIN — diphenhydrAMINE (BENADRYL) injection 12.5 mg: INTRAVENOUS | @ 02:00:00 | NDC 63323066401

## 2012-07-02 MED ADMIN — epoetin alfa (EPOGEN;PROCRIT) injection 15,000 Units: INTRAVENOUS | @ 21:00:00 | NDC 59676030202

## 2012-07-02 MED ADMIN — folic acid (FOLVITE) tablet 1 mg: ORAL | @ 13:00:00 | NDC 00603316232

## 2012-07-02 MED ADMIN — HYDROmorphone (DILAUDID) injection 4 mg: INTRAVENOUS | @ 02:00:00 | NDC 00641012121

## 2012-07-02 MED ADMIN — heparin (porcine) injection 5,000 Units: SUBCUTANEOUS | @ 11:00:00 | NDC 00409272301

## 2012-07-02 MED ADMIN — metoprolol (LOPRESSOR) tablet 100 mg: ORAL | @ 02:00:00 | NDC 50742010810

## 2012-07-02 MED ADMIN — ondansetron (ZOFRAN) injection 4 mg: INTRAVENOUS | @ 17:00:00 | NDC 00409475503

## 2012-07-02 MED ADMIN — HYDROmorphone (DILAUDID) injection 4 mg: INTRAVENOUS | @ 07:00:00 | NDC 00641012121

## 2012-07-02 MED ADMIN — sodium chloride (NS) flush 5-10 mL: INTRAVENOUS | @ 03:00:00 | NDC 87701099893

## 2012-07-02 MED ADMIN — HYDROmorphone (DILAUDID) injection 4 mg: INTRAVENOUS | @ 13:00:00 | NDC 00641012121

## 2012-07-02 MED ADMIN — ondansetron (ZOFRAN) injection 4 mg: INTRAVENOUS | @ 09:00:00 | NDC 00641607801

## 2012-07-02 MED ADMIN — calcium acetate (PHOSLO) capsule 1,334 mg: ORAL | @ 21:00:00 | NDC 00054008826

## 2012-07-02 MED ADMIN — metoprolol (LOPRESSOR) tablet 100 mg: ORAL | @ 13:00:00 | NDC 50742010810

## 2012-07-02 MED ADMIN — HYDROmorphone (DILAUDID) injection 4 mg: INTRAVENOUS | @ 19:00:00 | NDC 00641012121

## 2012-07-02 MED ADMIN — calcium acetate (PHOSLO) capsule 1,334 mg: ORAL | @ 13:00:00 | NDC 00054008826

## 2012-07-02 MED ADMIN — sodium chloride (NS) flush 5-10 mL: INTRAVENOUS | @ 18:00:00 | NDC 82903065462

## 2012-07-02 MED ADMIN — HYDROmorphone (DILAUDID) injection 4 mg: INTRAVENOUS | @ 17:00:00 | NDC 00641012121

## 2012-07-02 MED ADMIN — diphenhydrAMINE (BENADRYL) injection 12.5 mg: INTRAVENOUS | @ 13:00:00 | NDC 63323066401

## 2012-07-02 MED ADMIN — calcium acetate (PHOSLO) capsule 1,334 mg: ORAL | @ 17:00:00 | NDC 00054008826

## 2012-07-02 MED ADMIN — ondansetron (ZOFRAN) injection 4 mg: INTRAVENOUS | @ 21:00:00 | NDC 00409475503

## 2012-07-02 MED ADMIN — HYDROmorphone (DILAUDID) injection 4 mg: INTRAVENOUS | @ 09:00:00 | NDC 00641012121

## 2012-07-02 MED FILL — CALCIUM ACETATE 667 MG CAP: 667 mg | ORAL | Qty: 2

## 2012-07-02 MED FILL — AMLODIPINE 5 MG TAB: 5 mg | ORAL | Qty: 2

## 2012-07-02 MED FILL — SENNA PLUS 8.6 MG-50 MG TABLET: ORAL | Qty: 1

## 2012-07-02 MED FILL — HYDROMORPHONE 2 MG/ML INJECTION SOLUTION: 2 mg/mL | INTRAMUSCULAR | Qty: 2

## 2012-07-02 MED FILL — METOPROLOL TARTRATE 50 MG TAB: 50 mg | ORAL | Qty: 2

## 2012-07-02 MED FILL — FOLIC ACID 1 MG TAB: 1 mg | ORAL | Qty: 1

## 2012-07-02 MED FILL — DIPHENHYDRAMINE HCL 50 MG/ML IJ SOLN: 50 mg/mL | INTRAMUSCULAR | Qty: 1

## 2012-07-02 MED FILL — INSULIN GLARGINE 100 UNIT/ML INJECTION: 100 unit/mL | SUBCUTANEOUS | Qty: 0.04

## 2012-07-02 MED FILL — ONDANSETRON (PF) 4 MG/2 ML INJECTION: 4 mg/2 mL | INTRAMUSCULAR | Qty: 2

## 2012-07-02 MED FILL — RENAL CAPS 1 MG CAPSULE: 1 mg | ORAL | Qty: 1

## 2012-07-02 MED FILL — HEPARIN (PORCINE) 5,000 UNIT/ML IJ SOLN: 5000 unit/mL | INTRAMUSCULAR | Qty: 1

## 2012-07-02 MED FILL — POLYETHYLENE GLYCOL 3350 17 GRAM (100 %) ORAL POWDER PACKET: 17 gram | ORAL | Qty: 1

## 2012-07-02 MED FILL — EPOETIN ALFA 20,000 UNIT/ML IJ SOLN: 20000 unit/mL | INTRAMUSCULAR | Qty: 1

## 2012-07-02 NOTE — Procedures (Signed)
[facility]                         [phone]  Vitals Pre Post Assessment Pre Post   BP 166/77 181/91 LOC A & O A & O   HR 81 76 Lungs clear No sob   Temp 98.4 97.5 Cardiac regular regular   Resp 20 20 Skin warm warm   Weight 92.3 kgs - 5.5 kgs Edema BLE BLE      Pain Legs and back 6(0-10); premedicated Legs and back 8 (0 - 10) medicated by primary RN     Orders   Duration: Start: 1310 End: 1724 Total: 4 hours   Dialyzer: revaclear   K Bath: 1 ( per Dr. Tripathi)   Ca Bath: 2   Na / Bicarb: 140/35   Target Fluid Removal: 5.5 kgs as tolerated     Access   Type & Location: Right upper arm fistula   Comments:  Pre dialysis patent; prepped per protocol; no s/s of infection noted;cannulated without difficulty; during dialysis pt constantly moving right arm caused arterial needle to infiltrate dime size hardened area noted above arterial site; pt educated about access care and encouraged to keep arm still while on dialysis; cannulated arterial site again without difficulty + aspiration/flushed still highest BFR 350; post dialysis hemostasis achieved no excessive bleeding noted fistula patent infiltration resolved                           Labs   Hep B status / date: AB 62 as 01-24-12   Obtained/Reviewed  Critical Results Called   Reviewed K 6.3 Dr Tripathi aware     Meds Given   Name Dose Route   procrit 15000 units IVP               Total Liters Process: 86.2   Net Fluid Removed: 5.5 kgs      Comments   Time Out Done: Yes 1300   Primary Nurse Rpt Pre: Ron Althaus, RN   Primary Nurse Rpt Post: Ron Althaus, RN   Pt Education: Access care   Tx Summary: Tolerated 4 hours of treatment; at the end of treatment all possible blood returned; gave post dialysis report and left care to pt primary nurse; bed in lowest position wheels locked side rails up call bell in reach

## 2012-07-02 NOTE — Progress Notes (Signed)
[facility] Progress Note  [facility]   [address] [place], [place], [place] [address]   [phone]      Assessment & Plan:   ESRD   HD today and MWF   4 hour, 1 k/2 ca bath, EDW 85 kg  [place] HD unit:we will davita know,  HTN   Continue current meds and follow after HD  Hyperkalemia   HD this am     Renal diet  This might be due to cell lysis:sickle cell crisis and release of Intra cellular K  CKD-MBD   Phoslo  Anemia   Due to CKD and SSDz   EPO with HD           Subjective:   CC:f/up ESRD  HPI: Patient seen   Due for HD today  He says he is going back to [place] and will move back to [place] in few months.    Current Facility-Administered Medications   Medication Dose Route Frequency   ??? levofloxacin (LEVAQUIN) tablet 500 mg  500 mg Oral Q48H   ??? epoetin alfa (EPOGEN;PROCRIT) injection 15,000 Units  15,000 Units IntraVENous Q MON, WED & FRI   ??? insulin glargine (LANTUS) injection 4 Units  4 Units SubCUTAneous DAILY   ??? calcium acetate (PHOSLO) capsule 1,334 mg  2 Cap Oral TID WITH MEALS   ??? metoprolol (LOPRESSOR) tablet 100 mg  100 mg Oral BID   ??? sodium chloride (NS) flush 5-10 mL  5-10 mL IntraVENous Q8H   ??? sodium chloride (NS) flush 5-10 mL  5-10 mL IntraVENous PRN   ??? acetaminophen (TYLENOL) tablet 650 mg  650 mg Oral Q4H PRN   ??? oxyCODONE-acetaminophen (PERCOCET 10) 10-325 mg per tablet 1 Tab  1 Tab Oral Q4H PRN   ??? naloxone (NARCAN) injection 0.4 mg  0.4 mg IntraVENous PRN   ??? ondansetron (ZOFRAN) injection 4 mg  4 mg IntraVENous Q4H PRN   ??? insulin lispro (HUMALOG) injection   SubCUTAneous AC&HS   ??? glucose chewable tablet 16 g  4 Tab Oral PRN   ??? dextrose (D50W) injection Syrg 12.5-25 g  12.5-25 g IntraVENous PRN   ??? glucagon (GLUCAGEN) injection 1 mg  1 mg IntraMUSCular PRN   ??? polyethylene glycol (MIRALAX) packet 17 g  17 g Oral DAILY   ??? senna-docusate (PERICOLACE) 8.6-50 mg per tablet 1 Tab  1 Tab  Oral DAILY   ??? folic acid (FOLVITE) tablet 1 mg  1 mg Oral DAILY   ??? labetalol (NORMODYNE;TRANDATE) 20 mg/4 mL (5 mg/mL) injection 20 mg  20 mg IntraVENous PRN   ??? hydroxyurea (HYDREA) chemo cap 500 mg  500 mg Oral DIALYSIS MON, WED, FRI   ??? heparin (porcine) injection 5,000 Units  5,000 Units SubCUTAneous Q8H   ??? diphenhydrAMINE (BENADRYL) injection 12.5 mg  12.5 mg IntraVENous Q6H PRN   ??? HYDROmorphone (DILAUDID) injection 4 mg  4 mg IntraVENous Q2H PRN   ??? amLODIPine (NORVASC) tablet 10 mg  10 mg Oral CUSTOM DAYS & TIMES   ??? amLODIPine (NORVASC) tablet 10 mg  10 mg Oral CUSTOM DAYS & TIMES   ??? B complex-vitaminC-folic acid (NEPHROCAP) cap  1 Cap Oral DAILY          Objective:     Vitals:  Blood pressure 174/84, pulse 68, temperature 98 ??F (36.7 ??C), resp. rate 18, height 5\' 5"  (1.651 m), weight 92.1 kg (203 lb 0.7  oz), SpO2 97.00%.  Temp (24hrs), Avg:98 ??F (36.7 ??C), Min:97.7 ??F (36.5 ??C), Max:98.6 ??F (37 ??C)      Intake and Output:     07/13 1900 - 07/15 0659  In: 880 [P.O.:880]  Out: -     Physical Exam:                Patient is intubated:  no    Physical Examination:   GENERAL ASSESSMENT: NAD  HEENT:Nontraumatic   CHEST: CTA  HEART: S1S2  ABDOMEN: Soft,NT,  JX:BJYNW:   2 edema          ECG/rhythm:    Data Review      No results found for this basename: ITNL in the last 72 hours   No results found for this basename: CPK:3,CKMB:3,TROIQ:3, in the last 72 hours  Recent Labs   Basename 07/02/12 0502 07/01/12 0403 06/30/12 0755    NA 130* 136 132*    K 6.3* 5.6* 5.6*    CL 93* 97 98    CO2 26 30 28     BUN 71* 47* 35*    CREA 8.42* 6.74* 5.40*    GLU 96 92 92    PHOS -- -- --    MG -- -- 2.0    CA 8.8 8.7 9.0    ALB -- -- --    WBC 3.1* 3.5* 3.0*    HGB 8.6* 8.4* 8.9*    HCT 27.0* 26.9* 28.7*    PLT 156 174 156      No results found for this basename: INR:3,PTP:3,APTT:3, in the last 72 hours  Needs: urine analysis, urine sodium, protein and creatinine  No results found for this basename: NAU,  KU, CREAU          Discussed with:  Colleague, pt          [facility]  [address]  [place], [place] [address]  Phone - [phone]  Fax - [phone]

## 2012-07-02 NOTE — Procedures (Signed)
[facility] [place] [facility]                         [phone]  Vitals Pre Post Assessment Pre Post   BP 166/77 181/91 LOC A & O A & O   HR 81 76 Lungs clear No sob   Temp 98.4 97.5 Cardiac regular regular   Resp 20 20 Skin warm warm   Weight 92.3 kgs - 5.5 kgs Edema BLE BLE      Pain Legs and back 6(0-10); premedicated Legs and back 8 (0 - 10) medicated by primary RN     Orders   Duration: Start: 1310 End: 1724 Total: 4 hours   Dialyzer: revaclear   K Bath: 1 ( per Dr. Lucia Estelle)   Ca Bath: 2   Na / Bicarb: 140/35   Target Fluid Removal: 5.5 kgs as tolerated     Access   Type & Location: Right upper arm fistula   Comments:  Pre dialysis patent; prepped per protocol; no s/s of infection noted;cannulated without difficulty; during dialysis pt constantly moving right arm caused arterial needle to infiltrate dime size hardened area noted above arterial site; pt educated about access care and encouraged to keep arm still while on dialysis; cannulated arterial site again without difficulty + aspiration/flushed still highest BFR 350; post dialysis hemostasis achieved no excessive bleeding noted fistula patent infiltration resolved                           Labs   Hep B status / date: AB 62 as 01-24-12   Obtained/Reviewed  Critical Results Called   Reviewed K 6.3 Dr Lucia Estelle aware     Meds Given   Name Dose Route   procrit 45409 units IVP               Total Liters Process: 86.2   Net Fluid Removed: 5.5 kgs      Comments   Time Out Done: Yes 1300   Primary Nurse Rpt Pre: Reola Calkins, RN   Primary Nurse Rpt Post: Reola Calkins, RN   Pt Education: Access care   Tx Summary: Tolerated 4 hours of treatment; at the end of treatment all possible blood returned; gave post dialysis report and left care to pt primary nurse; bed in lowest position wheels locked side rails up call bell in reach

## 2012-07-02 NOTE — Discharge Summary (Signed)
Hospitalist Discharge Summary     Patient ID:    Tyler Ballard  [id number]  31 y.o.  July 28, 1981    Admit date: July 15, 2012    Discharge date and time: 07/02/2012    Admission Diagnoses: Sickle cell crisis    Chronic Diagnoses:    Problem List as of 07/02/2012  Date Reviewed: 2012-07-15      Codes Class Noted - Resolved    Hyperkalemia 276.7  2012-07-15 - Present        ESRD on hemodialysis 585.6  Jul 15, 2012 - Present        Unspecified essential hypertension 401.9  2012-07-15 - Present        Type II or unspecified type diabetes mellitus with renal manifestations, not stated as uncontrolled 250.40  07/15/12 - Present        *Sickle cell crisis 282.62  05/24/2012 - Present              Discharge Medications:   Current Discharge Medication List      CONTINUE these medications which have NOT CHANGED    Details   amLODIPine (NORVASC) 10 mg tablet Take 1 Tab by mouth daily for 30 days.  Qty: 30 Tab, Refills: 0      metoprolol (LOPRESSOR) 100 mg tablet Take 1 Tab by mouth two (2) times a day for 30 days.  Qty: 60 Tab, Refills: 0      insulin glargine (LANTUS) 100 unit/mL injection Look like now do not need Lantus, if your blood sugar start running high for fasting blood sugar >150 then start Lantus 4 units daily if fasting blood sugar remain >150 then increase to 8 units daily as per previous home dose  Qty: 1 Vial, Refills: 0      HYDROmorphone (DILAUDID) 4 mg tablet Take  by mouth every three (3) hours as needed.      insulin aspart (NOVOLOG) 100 unit/mL injection by SubCUTAneous route. Sliding scale      calcium acetate (PHOSLO) 667 mg cap Take 2 Caps by mouth three (3) times daily (with meals).      folic acid 1 mg Tab 0.5 mg, multivitamin, stress formula Tab 1 Tab Take 1 Dose by mouth daily.         STOP taking these medications       lisinopril (PRINIVIL, ZESTRIL) 40 mg tablet Comments:   Reason for Stopping:               Follow up Care:    1. Phys Other, MD in 1-2 weeks  2.      Diet:  Diabetic Diet and Renal Diet     Disposition:  Home.    Advanced Directive:    Discharge Exam:  See today's note.    CONSULTATIONS: nephrology    Significant Diagnostic Studies:   Recent Labs   [facility] 07/02/12 0502 07/01/12 0403    WBC 3.1* 3.5*    HGB 8.6* 8.4*    HCT 27.0* 26.9*    PLT 156 174     Recent Labs   Basename 07/02/12 0502 07/01/12 0403 06/30/12 0755    NA 130* 136 132*    K 6.3* 5.6* 5.6*    CL 93* 97 98    CO2 26 30 28     BUN 71* 47* 35*    CREA 8.42* 6.74* 5.40*    GLU 96 92 92    CA 8.8 8.7 9.0    MG -- -- 2.0    PHOS -- -- --  URICA -- -- --     No results found for this basename: SGOT:3,GPT:3,AP:3,TBIL:3,TP:3,ALB:3,GLOB:3,GGT:3,AML:3,AMYP:3,LPSE:3,HLPSE:3 in the last 72 hours  No results found for this basename: INR:3,PTP:3,APTT:3, in the last 72 hours   No results found for this basename: FE:2,TIBC:2,PSAT:2,FERR:2, in the last 72 hours   No results found for this basename: PH:2,PCO2:2,PO2:2, in the last 72 hours  No results found for this basename: CPK:3,CKMB:3,TROPONINI:3, in the last 72 hours  Lab Results   Component Value Date/Time    POC GLUCOSE 96 07/02/2012 11:30 AM    POC GLUCOSE 89 07/02/2012  7:15 AM    POC GLUCOSE 102 07/01/2012  8:57 PM    POC GLUCOSE 101 07/01/2012  4:50 PM    POC GLUCOSE 117 07/01/2012 11:13 AM             HOSPITAL COURSE & TREATMENT RENDERED:   1. Sickle cell crisis (05/24/2012) - unclear trigger. No fevers. No leukocytosis but ?bronchitis.   -continue dilaudid  PO    folic acid   -continue  hydroxyurea 500mg  after HD      2. Hyperkalemia (06/28/2012) - should get better after dialysis  -hold ACE I   -renal consulted for HD appreciated.   - FU with PCP    3. ESRD on hemodialysis (06/28/2012) - on HD MWF     4. Unspecified essential hypertension (06/28/2012)   -continue amlodipine and metoprolol   -hold ACE I due to hyperK+     5. Type II or unspecified type diabetes mellitus with renal manifestations, not stated as uncontrolled (06/28/2012)   -ISS   -BG checks AC TID and qHS   -Lantus 4u qHS   -diabetic  diet   - BS is well controlled.                Signed:  Cleotis Nipper, MD  07/02/2012  4:57 PM

## 2012-07-02 NOTE — Progress Notes (Signed)
Bedside shift change report given to Domenica Fail RN (oncoming nurse) by Reola Calkins RN (offgoing nurse).  Report given with SBAR, Kardex, Intake/Output and MAR.

## 2012-07-02 NOTE — Progress Notes (Signed)
Medical Progress Note      NAME: Tyler Ballard   DOB:  [birth year]  MRM:  [id number]    Date/Time: 07/02/2012        Principal Problem:   *Sickle cell crisis (05/24/2012)  Active Problems:     Hyperkalemia (06/28/2012)     ESRD on hemodialysis (06/28/2012)     Unspecified essential hypertension (06/28/2012)     Type II or unspecified type diabetes mellitus with renal manifestations, not stated as uncontrolled (06/28/2012)        Assessment/Plan:   1.  Sickle cell crisis (05/24/2012) - unclear trigger.   No fevers. No leukocytosis but ?bronchitis.   -dilaudid 4mg  IV q2h PRN. Transition to PO   -continue O2   -bowel regimen considering high narcotic use   -start folic acid   -continue home hydroxyurea 500mg  after HD   -will continue levofloxacin for now to complete course of therapy for ?bronchitis though expect this is resolving considering no WBC count or fevers     2.  Hyperkalemia (06/28/2012) - per pt, on HD MWF. Has not missed any sessions.  Kayexalate. Will have dialysis today  -monitor on tele   -hold ACE I   -renal consulted for HD appreciated.     3.  ESRD on hemodialysis (06/28/2012) - on HD MWF      4.  Unspecified essential hypertension (06/28/2012)   -continue amlodipine and metoprolol   -hold ACE I due to hyperK+     5.  Type II or unspecified type diabetes mellitus with renal manifestations, not stated as uncontrolled (06/28/2012)   -ISS   -BG checks AC TID and qHS   -Lantus 4u qHS   -diabetic diet   - BS is well controlled.    6.  Nausea and vomiting. Anti emetics PRN.           Subjective:     Chief Complaint: still in pain and has N/V    ROS:  (bold if positive, if negative)    Nausea/Vomit  Tolerating PT  Tolerating Diet          Objective:       Vitals:          Last 24hrs VS reviewed since prior progress note. Most recent are:    Visit Vitals   Item Reading   ??? BP 174/84   ??? Pulse 68   ??? Temp 98 ??F (36.7 ??C)   ??? Resp 18   ??? Ht 5\' 5"  (1.651 m)   ??? Wt 203 lb 0.7 oz   ??? BMI 33.79 kg/m2   ??? SpO2 97%      SpO2 Readings from Last 6 Encounters:   07/02/12 97%   06/19/12 99%   06/12/12 96%    O2 Flow Rate (L/min): 2 l/min       Intake/Output Summary (Last 24 hours) at 07/02/12 1020  Last data filed at 07/02/12 0539   Gross per 24 hour   Intake    640 ml   Output      0 ml   Net    640 ml          Exam:     Physical Exam:    Gen:  Well-developed, well-nourished, in no acute distress  HEENT:  Pink conjunctivae, PERRL, hearing intact to voice, moist mucous membranes  Neck:  Supple, without masses, thyroid non-tender  Resp:  No accessory muscle use, clear breath sounds without wheezes rales or rhonchi  Card:  No murmurs, normal S1, S2 without thrills, bruits.+  peripheral edema  Abd:  Soft, non-tender, non-distended, normoactive bowel sounds are present, no palpable organomegaly and no detectable hernias  Lymph:  No cervical or inguinal adenopathy  Musc:  No cyanosis or clubbing  Skin:  No rashes or ulcers, skin turgor is good  Neuro:  Cranial nerves are grossly intact, no focal motor weakness, follows commands appropriately  Psych:  Good insight, oriented to person, place and time, alert           Medications Reviewed: (see below)    Lab Data Reviewed: (see below)    ______________________________________________________________________    Medications:     Current Facility-Administered Medications   Medication Dose Route Frequency   ??? levofloxacin (LEVAQUIN) tablet 500 mg  500 mg Oral Q48H   ??? epoetin alfa (EPOGEN;PROCRIT) injection 15,000 Units  15,000 Units IntraVENous Q MON, WED & FRI   ??? insulin glargine (LANTUS) injection 4 Units  4 Units SubCUTAneous DAILY   ??? calcium acetate (PHOSLO) capsule 1,334 mg  2 Cap Oral TID WITH MEALS   ??? metoprolol (LOPRESSOR) tablet 100 mg  100 mg Oral BID   ??? sodium chloride (NS) flush 5-10 mL  5-10 mL IntraVENous Q8H   ??? sodium chloride (NS) flush 5-10 mL  5-10 mL IntraVENous PRN   ??? acetaminophen (TYLENOL) tablet 650 mg  650 mg Oral Q4H PRN   ??? oxyCODONE-acetaminophen (PERCOCET 10)  10-325 mg per tablet 1 Tab  1 Tab Oral Q4H PRN   ??? naloxone (NARCAN) injection 0.4 mg  0.4 mg IntraVENous PRN   ??? ondansetron (ZOFRAN) injection 4 mg  4 mg IntraVENous Q4H PRN   ??? insulin lispro (HUMALOG) injection   SubCUTAneous AC&HS   ??? glucose chewable tablet 16 g  4 Tab Oral PRN   ??? dextrose (D50W) injection Syrg 12.5-25 g  12.5-25 g IntraVENous PRN   ??? glucagon (GLUCAGEN) injection 1 mg  1 mg IntraMUSCular PRN   ??? polyethylene glycol (MIRALAX) packet 17 g  17 g Oral DAILY   ??? senna-docusate (PERICOLACE) 8.6-50 mg per tablet 1 Tab  1 Tab Oral DAILY   ??? folic acid (FOLVITE) tablet 1 mg  1 mg Oral DAILY   ??? labetalol (NORMODYNE;TRANDATE) 20 mg/4 mL (5 mg/mL) injection 20 mg  20 mg IntraVENous PRN   ??? hydroxyurea (HYDREA) chemo cap 500 mg  500 mg Oral DIALYSIS MON, WED, FRI   ??? heparin (porcine) injection 5,000 Units  5,000 Units SubCUTAneous Q8H   ??? diphenhydrAMINE (BENADRYL) injection 12.5 mg  12.5 mg IntraVENous Q6H PRN   ??? HYDROmorphone (DILAUDID) injection 4 mg  4 mg IntraVENous Q2H PRN   ??? amLODIPine (NORVASC) tablet 10 mg  10 mg Oral CUSTOM DAYS & TIMES   ??? amLODIPine (NORVASC) tablet 10 mg  10 mg Oral CUSTOM DAYS & TIMES   ??? B complex-vitaminC-folic acid (NEPHROCAP) cap  1 Cap Oral DAILY            Lab Review:     Recent Labs   [facility] 07/02/12 0502 07/01/12 0403 06/30/12 0755    WBC 3.1* 3.5* 3.0*    HGB 8.6* 8.4* 8.9*    HCT 27.0* 26.9* 28.7*    PLT 156 174 156     Recent Labs   Basename 07/02/12 0502 07/01/12 0403 06/30/12 0755    NA 130* 136 132*    K 6.3* 5.6* 5.6*    CL 93* 97 98    CO2 26  30 28    GLU 96 92 92    BUN 71* 47* 35*    CREA 8.42* 6.74* 5.40*    CA 8.8 8.7 9.0    MG -- -- 2.0    PHOS -- -- --    ALB -- -- --    TBIL -- -- --    SGOT -- -- --    INR -- -- --     Lab Results   Component Value Date/Time    POC GLUCOSE 89 07/02/2012  7:15 AM    POC GLUCOSE 102 07/01/2012  8:57 PM    POC GLUCOSE 101 07/01/2012  4:50 PM    POC GLUCOSE 117 07/01/2012 11:13 AM    POC GLUCOSE 89 07/01/2012  7:25 AM      No results found for this basename: PH:3,PCO2:3,PO2:3,HCO3:3,FIO2:3 in the last 72 hours  No results found for this basename: INR:3 in the last 72 hours    Lab Results   Component Value Date/Time    Specimen Description: BLOOD 06/16/2012 10:12 PM    Specimen Description: BLOOD 05/24/2012 10:47 AM     Lab Results   Component Value Date/Time    Culture result: NO GROWTH 5 DAYS 06/16/2012 10:12 PM    Culture result: NO GROWTH 5 DAYS 05/24/2012 10:47 AM                     Total time spent with patient: 73                  Care Plan discussed with: Patient, Nursing Staff and >50% of time spent in counseling and coordination of care    Discussed:  Care Plan    Prophylaxis:  Lovenox    Disposition:  Home w/Family           ___________________________________________________    Attending Physician: Cleotis Nipper, MD

## 2012-07-03 LAB — GLUCOSE, POC
Glucose (POC): 173 mg/dL — ABNORMAL HIGH (ref 75–110)
Glucose (POC): 152 mg/dL — ABNORMAL HIGH (ref 75–110)
Glucose (POC): 45 mg/dL — CL (ref 75–110)
Glucose (POC): 49 mg/dL — CL (ref 75–110)
Glucose (POC): 50 mg/dL — ABNORMAL LOW (ref 75–110)
Glucose (POC): 52 mg/dL — ABNORMAL LOW (ref 75–110)
Glucose (POC): 72 mg/dL — ABNORMAL LOW (ref 75–110)
Glucose (POC): 91 mg/dL (ref 75–110)
Glucose (POC): 105 mg/dL (ref 75–110)

## 2012-07-03 LAB — METABOLIC PANEL, BASIC
Anion gap: 10 mmol/L (ref 5–15)
BUN/Creatinine ratio: 6 — ABNORMAL LOW (ref 12–20)
BUN: 38 MG/DL — ABNORMAL HIGH (ref 6–20)
CO2: 27 MMOL/L (ref 21–32)
Calcium: 8.6 MG/DL (ref 8.5–10.1)
Chloride: 97 MMOL/L (ref 97–108)
Creatinine: 5.93 MG/DL — ABNORMAL HIGH (ref 0.45–1.15)
GFR est AA: 14 mL/min/{1.73_m2} — ABNORMAL LOW (ref >60)
GFR est non-AA: 11 mL/min/{1.73_m2} — ABNORMAL LOW (ref >60)
Glucose: 99 MG/DL (ref 65–100)
Potassium: 5.3 MMOL/L — ABNORMAL HIGH (ref 3.5–5.1)
Sodium: 134 MMOL/L — ABNORMAL LOW (ref 136–145)

## 2012-07-03 LAB — CBC WITH AUTOMATED DIFF
ABS. BASOPHILS: 0 10*3/uL (ref 0.0–0.1)
ABS. EOSINOPHILS: 0.5 10*3/uL — ABNORMAL HIGH (ref 0.0–0.4)
ABS. LYMPHOCYTES: 1.4 10*3/uL (ref 0.8–3.5)
ABS. MONOCYTES: 0.4 10*3/uL (ref 0.0–1.0)
ABS. NEUTROPHILS: 1.9 10*3/uL (ref 1.8–8.0)
BASOPHILS: 1 % (ref 0–1)
EOSINOPHILS: 11 % — ABNORMAL HIGH (ref 0–7)
HCT: 31.7 % — ABNORMAL LOW (ref 36.6–50.3)
HGB: 10.2 g/dL — ABNORMAL LOW (ref 12.1–17.0)
LYMPHOCYTES: 33 % (ref 12–49)
MCH: 30.9 PG (ref 26.0–34.0)
MCHC: 32.2 g/dL (ref 30.0–36.5)
MCV: 96.1 FL (ref 80.0–99.0)
MONOCYTES: 10 % (ref 5–13)
NEUTROPHILS: 45 % (ref 32–75)
PLATELET: 176 10*3/uL (ref 150–400)
RBC: 3.3 M/uL — ABNORMAL LOW (ref 4.10–5.70)
RDW: 15.5 % — ABNORMAL HIGH (ref 11.5–14.5)
WBC: 4.2 10*3/uL (ref 4.1–11.1)

## 2012-07-03 MED ORDER — HYDROXYUREA 500 MG CAPSULE
500 mg | ORAL_CAPSULE | ORAL | Status: AC
Start: 2012-07-03 — End: 2012-08-03

## 2012-07-03 MED ADMIN — sodium chloride (NS) flush 5-10 mL: INTRAVENOUS | @ 09:00:00 | NDC 87701099893

## 2012-07-03 MED ADMIN — sodium chloride (NS) flush 5-10 mL: INTRAVENOUS | @ 02:00:00 | NDC 82903065462

## 2012-07-03 MED ADMIN — ondansetron (ZOFRAN) injection 4 mg: INTRAVENOUS | @ 01:00:00 | NDC 00641607801

## 2012-07-03 MED ADMIN — HYDROmorphone (DILAUDID) injection 4 mg: INTRAVENOUS | @ 23:00:00 | NDC 00641012121

## 2012-07-03 MED ADMIN — diphenhydrAMINE (BENADRYL) injection 12.5 mg: INTRAVENOUS | @ 07:00:00 | NDC 63323066401

## 2012-07-03 MED ADMIN — heparin (porcine) injection 5,000 Units: SUBCUTANEOUS | @ 17:00:00 | NDC 00409272301

## 2012-07-03 MED ADMIN — HYDROmorphone (DILAUDID) injection 4 mg: INTRAVENOUS | @ 07:00:00 | NDC 00641012121

## 2012-07-03 MED ADMIN — calcium acetate (PHOSLO) capsule 1,334 mg: ORAL | @ 16:00:00 | NDC 00054008826

## 2012-07-03 MED ADMIN — HYDROmorphone (DILAUDID) injection 4 mg: INTRAVENOUS | @ 12:00:00 | NDC 00641012121

## 2012-07-03 MED ADMIN — HYDROmorphone (DILAUDID) injection 4 mg: INTRAVENOUS | @ 05:00:00 | NDC 00641012121

## 2012-07-03 MED ADMIN — insulin glargine (LANTUS) injection 4 Units: SUBCUTANEOUS | @ 12:00:00 | NDC 09999222001

## 2012-07-03 MED ADMIN — metoprolol (LOPRESSOR) tablet 100 mg: ORAL | @ 12:00:00 | NDC 50742010810

## 2012-07-03 MED ADMIN — ondansetron (ZOFRAN) injection 6 mg: INTRAVENOUS | @ 14:00:00 | NDC 00409475503

## 2012-07-03 MED ADMIN — heparin (porcine) injection 5,000 Units: SUBCUTANEOUS | @ 10:00:00 | NDC 00409272301

## 2012-07-03 MED ADMIN — diphenhydrAMINE (BENADRYL) injection 12.5 mg: INTRAVENOUS | @ 01:00:00 | NDC 63323066401

## 2012-07-03 MED ADMIN — amLODIPine (NORVASC) tablet 10 mg: ORAL | @ 12:00:00 | NDC 59762153002

## 2012-07-03 MED ADMIN — calcium acetate (PHOSLO) capsule 1,334 mg: ORAL | @ 12:00:00 | NDC 00054008826

## 2012-07-03 MED ADMIN — amLODIPine (NORVASC) tablet 10 mg: ORAL | @ 01:00:00 | NDC 59762153002

## 2012-07-03 MED ADMIN — metoprolol (LOPRESSOR) tablet 100 mg: ORAL | @ 01:00:00 | NDC 50742010810

## 2012-07-03 MED ADMIN — diphenhydrAMINE (BENADRYL) injection 12.5 mg: INTRAVENOUS | @ 17:00:00 | NDC 63323066401

## 2012-07-03 MED ADMIN — HYDROmorphone (DILAUDID) injection 4 mg: INTRAVENOUS | @ 03:00:00 | NDC 00641012121

## 2012-07-03 MED ADMIN — insulin lispro (HUMALOG) injection: SUBCUTANEOUS | @ 12:00:00 | NDC 99999192301

## 2012-07-03 MED ADMIN — levofloxacin (LEVAQUIN) tablet 500 mg: ORAL | @ 01:00:00 | NDC 68084048211

## 2012-07-03 MED ADMIN — HYDROmorphone (DILAUDID) injection 4 mg: INTRAVENOUS | @ 14:00:00 | NDC 00641012121

## 2012-07-03 MED ADMIN — heparin (porcine) injection 5,000 Units: SUBCUTANEOUS | @ 01:00:00 | NDC 00409272301

## 2012-07-03 MED ADMIN — B complex-vitaminC-folic acid (NEPHROCAP) cap: ORAL | @ 12:00:00 | NDC 70199002611

## 2012-07-03 MED ADMIN — folic acid (FOLVITE) tablet 1 mg: ORAL | @ 12:00:00 | NDC 00603316232

## 2012-07-03 MED ADMIN — sodium chloride (NS) flush 5-10 mL: INTRAVENOUS | @ 17:00:00 | NDC 82903065462

## 2012-07-03 MED ADMIN — HYDROmorphone (DILAUDID) injection 4 mg: INTRAVENOUS | @ 09:00:00 | NDC 00641012121

## 2012-07-03 MED ADMIN — HYDROmorphone (DILAUDID) injection 4 mg: INTRAVENOUS | @ 01:00:00 | NDC 00641012121

## 2012-07-03 MED ADMIN — diphenhydrAMINE (BENADRYL) injection 12.5 mg: INTRAVENOUS | @ 23:00:00 | NDC 63323066401

## 2012-07-03 MED ADMIN — HYDROmorphone (DILAUDID) injection 4 mg: INTRAVENOUS | @ 19:00:00 | NDC 00641012121

## 2012-07-03 MED ADMIN — HYDROmorphone (DILAUDID) injection 4 mg: INTRAVENOUS | @ 17:00:00 | NDC 00641012121

## 2012-07-03 MED ADMIN — HYDROmorphone (DILAUDID) injection 4 mg: INTRAVENOUS | @ 21:00:00 | NDC 00641012121

## 2012-07-03 MED FILL — HYDROMORPHONE 2 MG/ML INJECTION SOLUTION: 2 mg/mL | INTRAMUSCULAR | Qty: 2

## 2012-07-03 MED FILL — DIPHENHYDRAMINE HCL 50 MG/ML IJ SOLN: 50 mg/mL | INTRAMUSCULAR | Qty: 1

## 2012-07-03 MED FILL — AMLODIPINE 5 MG TAB: 5 mg | ORAL | Qty: 2

## 2012-07-03 MED FILL — INSULIN LISPRO 100 UNIT/ML INJECTION: 100 unit/mL | SUBCUTANEOUS | Qty: 1

## 2012-07-03 MED FILL — RENAL CAPS 1 MG CAPSULE: 1 mg | ORAL | Qty: 1

## 2012-07-03 MED FILL — HEPARIN (PORCINE) 5,000 UNIT/ML IJ SOLN: 5000 unit/mL | INTRAMUSCULAR | Qty: 1

## 2012-07-03 MED FILL — ONDANSETRON (PF) 4 MG/2 ML INJECTION: 4 mg/2 mL | INTRAMUSCULAR | Qty: 4

## 2012-07-03 MED FILL — SENNA PLUS 8.6 MG-50 MG TABLET: ORAL | Qty: 1

## 2012-07-03 MED FILL — CALCIUM ACETATE 667 MG CAP: 667 mg | ORAL | Qty: 2

## 2012-07-03 MED FILL — FOLIC ACID 1 MG TAB: 1 mg | ORAL | Qty: 1

## 2012-07-03 MED FILL — POLYETHYLENE GLYCOL 3350 17 GRAM (100 %) ORAL POWDER PACKET: 17 gram | ORAL | Qty: 1

## 2012-07-03 MED FILL — LEVAQUIN 500 MG TABLET: 500 mg | ORAL | Qty: 1

## 2012-07-03 MED FILL — DEXTROSE 50% IN WATER (D50W) IV SYRG: INTRAVENOUS | Qty: 50

## 2012-07-03 MED FILL — INSULIN GLARGINE 100 UNIT/ML INJECTION: 100 unit/mL | SUBCUTANEOUS | Qty: 0.04

## 2012-07-03 MED FILL — HYDROXYUREA 500 MG CAPSULE: 500 mg | ORAL | Qty: 1

## 2012-07-03 MED FILL — METOPROLOL TARTRATE 50 MG TAB: 50 mg | ORAL | Qty: 2

## 2012-07-03 MED FILL — ONDANSETRON (PF) 4 MG/2 ML INJECTION: 4 mg/2 mL | INTRAMUSCULAR | Qty: 2

## 2012-07-03 NOTE — Progress Notes (Signed)
Spiritual Care Assessment/Progress Notes    Tyler Ballard [id number]  [id number]    [birth year]  31 y.o.  male    Patient Telephone Number: [phone] (home)   Religious Affiliation: No religion   Language: English   Extended Emergency Contact Information  Primary Emergency Contact: Boddie,Jenita  Address: [address]           [place], [place] [address] [place]  Home Phone: [phone]  Relation: Other Relative   Patient Active Problem List   Diagnoses Date Noted   ??? Hyperkalemia 06/28/2012   ??? ESRD on hemodialysis 06/28/2012   ??? Unspecified essential hypertension 06/28/2012   ??? Type II or unspecified type diabetes mellitus with renal manifestations, not stated as uncontrolled 06/28/2012   ??? Sickle cell crisis 05/24/2012        Date: 07/03/2012       Level of Religious/Spiritual Activity:  []          Involved in faith tradition/spiritual practice    []          Not involved in faith tradition/spiritual practice  []          Spiritually oriented    []          Claims no spiritual orientation    []          seeking spiritual identity  []          Feels alienated from religious practice/tradition  []          Feels angry about religious practice/tradition  [x]          Spirituality/religious tradition may be a Theatre stage manager for coping at this time.  []          Not able to assess due to medical condition    Services Provided Today:  []          crisis intervention    []          reading Scriptures  [x]          spiritual assessment    []          prayer  []          empathic listening/emotional support  []          rites and rituals (cite in comments)  []          life review     []          religious support  []          theological development   []          advocacy  []          ethical dialog     []          blessing  []          bereavement support    []          support to family  []          anticipatory grief support   []          help with AMD  []          spiritual guidance    []          meditation      Spiritual  Care Needs  []          Emotional Support  []          Spiritual/Religious Care  []          Loss/Adjustment  []          Advocacy/Referral /Ethics  [  x]         No needs expressed at this time  []          Other: (note in comments)  Spiritual Care Plan  []          Follow up visits with pt/family  []          Provide materials  []          Schedule sacraments  []          Contact [facility]  [x]          Follow up as needed  []          Other: (note in comments)     Comments: Completed initial visit and Spiritual Assessment. Provided support and presence. Offered empathic listening. Advised of Chaplain availability. Will follow up as able and or as needed.      Joy D. Timmie Foerster

## 2012-07-03 NOTE — Progress Notes (Signed)
Bedside and Verbal shift change report given to Leonette Nutting RN (oncoming nurse) by Lonzo Cloud (offgoing nurse).  Report given with SBAR, Kardex and Recent Results.

## 2012-07-03 NOTE — Progress Notes (Signed)
Pt. was seen walking in the first floor, informed the the patient it is against the policy to leave the ward, pt. Verbalized understanding.

## 2012-07-03 NOTE — Progress Notes (Signed)
[facility] Progress Note  [facility]   [address] [place], [place], [place] [address]   [phone]      Assessment & Plan:   ESRD   HD  MWF   No HD today  HTN   BO 130-140S Last 2 recordings:no changes for now  Hyperkalemia   OJ at bedside  This might be due to cell lysis:sickle cell crisis and release of Intra cellular K  CKD-MBD   Phoslo  Anemia   Due to CKD and SSDz   EPO with HD           Subjective:   CC:f/up ESRD  HPI: Patient seen   HD yesterday  Pain still present     Current Facility-Administered Medications   Medication Dose Route Frequency   ??? ondansetron (ZOFRAN) injection 6 mg  6 mg IntraVENous ONCE   ??? levofloxacin (LEVAQUIN) tablet 500 mg  500 mg Oral Q48H   ??? epoetin alfa (EPOGEN;PROCRIT) injection 15,000 Units  15,000 Units IntraVENous Q MON, WED & FRI   ??? insulin glargine (LANTUS) injection 4 Units  4 Units SubCUTAneous DAILY   ??? calcium acetate (PHOSLO) capsule 1,334 mg  2 Cap Oral TID WITH MEALS   ??? metoprolol (LOPRESSOR) tablet 100 mg  100 mg Oral BID   ??? sodium chloride (NS) flush 5-10 mL  5-10 mL IntraVENous Q8H   ??? sodium chloride (NS) flush 5-10 mL  5-10 mL IntraVENous PRN   ??? acetaminophen (TYLENOL) tablet 650 mg  650 mg Oral Q4H PRN   ??? oxyCODONE-acetaminophen (PERCOCET 10) 10-325 mg per tablet 1 Tab  1 Tab Oral Q4H PRN   ??? naloxone (NARCAN) injection 0.4 mg  0.4 mg IntraVENous PRN   ??? ondansetron (ZOFRAN) injection 4 mg  4 mg IntraVENous Q4H PRN   ??? insulin lispro (HUMALOG) injection   SubCUTAneous AC&HS   ??? glucose chewable tablet 16 g  4 Tab Oral PRN   ??? dextrose (D50W) injection Syrg 12.5-25 g  12.5-25 g IntraVENous PRN   ??? glucagon (GLUCAGEN) injection 1 mg  1 mg IntraMUSCular PRN   ??? polyethylene glycol (MIRALAX) packet 17 g  17 g Oral DAILY   ??? senna-docusate (PERICOLACE) 8.6-50 mg per tablet 1 Tab  1 Tab Oral DAILY   ??? folic acid (FOLVITE) tablet 1 mg  1 mg Oral DAILY   ??? labetalol (NORMODYNE;TRANDATE) 20 mg/4 mL (5  mg/mL) injection 20 mg  20 mg IntraVENous PRN   ??? hydroxyurea (HYDREA) chemo cap 500 mg  500 mg Oral DIALYSIS MON, WED, FRI   ??? heparin (porcine) injection 5,000 Units  5,000 Units SubCUTAneous Q8H   ??? diphenhydrAMINE (BENADRYL) injection 12.5 mg  12.5 mg IntraVENous Q6H PRN   ??? HYDROmorphone (DILAUDID) injection 4 mg  4 mg IntraVENous Q2H PRN   ??? amLODIPine (NORVASC) tablet 10 mg  10 mg Oral CUSTOM DAYS & TIMES   ??? amLODIPine (NORVASC) tablet 10 mg  10 mg Oral CUSTOM DAYS & TIMES   ??? B complex-vitaminC-folic acid (NEPHROCAP) cap  1 Cap Oral DAILY          Objective:     Vitals:  Blood pressure 147/84, pulse 69, temperature 98 ??F (36.7 ??C), resp. rate 18, height 5\' 5"  (1.651 m), weight 92.1 kg (203 lb 0.7 oz), SpO2 100.00%.  Temp (24hrs), Avg:97.9 ??F (36.6 ??C), Min:97.5 ??F (36.4 ??C), Max:98.4 ??F (36.9 ??C)      Intake and Output:  07/14 1900 - 07/16 0659  In: 1540 [P.O.:1540]  Out: 5500     Physical Exam:                Patient is intubated:  no    Physical Examination:   GENERAL ASSESSMENT: NAD     CHEST: CTA  HEART: S1S2  2 edema          ECG/rhythm:    Data Review      No results found for this basename: ITNL in the last 72 hours   No results found for this basename: CPK:3,CKMB:3,TROIQ:3, in the last 72 hours  Recent Labs   Basename 07/03/12 0444 07/02/12 0502 07/01/12 0403    NA 134* 130* 136    K 5.3* 6.3* 5.6*    CL 97 93* 97    CO2 27 26 30     BUN 38* 71* 47*    CREA 5.93* 8.42* 6.74*    GLU 99 96 92    PHOS -- -- --    MG -- -- --    CA 8.6 8.8 8.7    ALB -- -- --    WBC 4.2 3.1* 3.5*    HGB 10.2* 8.6* 8.4*    HCT 31.7* 27.0* 26.9*    PLT 176 156 174      No results found for this basename: INR:3,PTP:3,APTT:3, in the last 72 hours  Needs: urine analysis, urine sodium, protein and creatinine  No results found for this basename: NAU,   KU,  CREAU         dw pt      [facility]  [address]  [place], [place] [address]  Phone - [phone]  Fax - [phone]

## 2012-07-03 NOTE — Progress Notes (Signed)
Medical Progress Note      NAME: Tyler Ballard   DOB:  20-Mar-1981  MRM:  [id number]    Date/Time: 07/03/2012        Principal Problem:   *Sickle cell crisis (05/24/2012)  Active Problems:     Hyperkalemia (06/28/2012)     ESRD on hemodialysis (06/28/2012)     Unspecified essential hypertension (06/28/2012)     Type II or unspecified type diabetes mellitus with renal manifestations, not stated as uncontrolled (06/28/2012)        Assessment/Plan:   Sickle cell crisis (05/24/2012) - unclear trigger.   No fevers. No leukocytosis but ?bronchitis.   -dilaudid 4mg  IV q2h PRN; unable to tolerate PO this AM due to nausea  -continue O2   -bowel regimen considering high narcotic use   -start folic acid   -continue home hydroxyurea 500mg  after HD   -s/p course of levofloxacin for bronchitis     Hyperkalemia (06/28/2012) - per pt, on HD MWF. Has not missed any sessions.  S/p kayexalate  -monitor on tele   -hold ACE I   -renal on board      ESRD on hemodialysis (06/28/2012) - on HD MWF      Unspecified essential hypertension (06/28/2012)   -continue amlodipine and metoprolol   -hold ACE I due to hyperK+     Type II or unspecified type diabetes mellitus with renal manifestations, not stated as uncontrolled (06/28/2012)   -ISS   -BG checks AC TID and qHS   -Lantus 4u qHS   -diabetic diet   - BS is well controlled.    Nausea and vomiting. Anti emetics PRN.     Subjective:     Chief Complaint:   "I'm okay, I just feel nauseated"    Pt still in pain this AM and concerned since he is nauseated that he will be unable to tolerate PO narcotics. Pt reports regularly moving his bowels. No ab pain    ROS:  (bold if positive, if negative)    Nausea/Vomit  Tolerating PT  Tolerating Diet          Objective:       Vitals:          Last 24hrs VS reviewed since prior progress note. Most recent are:    Visit Vitals   Item Reading   ??? BP 147/84   ??? Pulse 69   ??? Temp 98 ??F (36.7 ??C)   ??? Resp 18   ??? Ht 5\' 5"  (1.651 m)   ??? Wt 203 lb 0.7 oz   ??? BMI 33.79  kg/m2   ??? SpO2 100%     SpO2 Readings from Last 6 Encounters:   07/03/12 100%   06/19/12 99%   06/12/12 96%    O2 Flow Rate (L/min): 2 l/min       Intake/Output Summary (Last 24 hours) at 07/03/12 0952  Last data filed at 07/02/12 1724   Gross per 24 hour   Intake    900 ml   Output   5500 ml   Net  -4600 ml          Exam:     Physical Exam:    Gen:  Well-developed, well-nourished, in no acute distress  HEENT:  Pink conjunctivae, PERRL, hearing intact to voice, moist mucous membranes  Neck:  Supple, without masses, thyroid non-tender  Resp:  No accessory muscle use, clear breath sounds without wheezes rales or rhonchi  Card:  No murmurs, normal S1,  S2 without thrills, bruits.+  peripheral edema  Abd:  Soft, non-tender, non-distended, normoactive bowel sounds are present, no palpable organomegaly and no detectable hernias  Lymph:  No cervical or inguinal adenopathy  Musc:  No cyanosis or clubbing  Skin:  No rashes or ulcers, skin turgor is good  Neuro:  Cranial nerves are grossly intact, no focal motor weakness, follows commands appropriately  Psych:  Good insight, oriented to person, place and time, alert           Medications Reviewed: (see below)    Lab Data Reviewed: (see below)    ______________________________________________________________________    Medications:     Current Facility-Administered Medications   Medication Dose Route Frequency   ??? ondansetron (ZOFRAN) injection 6 mg  6 mg IntraVENous ONCE   ??? levofloxacin (LEVAQUIN) tablet 500 mg  500 mg Oral Q48H   ??? epoetin alfa (EPOGEN;PROCRIT) injection 15,000 Units  15,000 Units IntraVENous Q MON, WED & FRI   ??? insulin glargine (LANTUS) injection 4 Units  4 Units SubCUTAneous DAILY   ??? calcium acetate (PHOSLO) capsule 1,334 mg  2 Cap Oral TID WITH MEALS   ??? metoprolol (LOPRESSOR) tablet 100 mg  100 mg Oral BID   ??? sodium chloride (NS) flush 5-10 mL  5-10 mL IntraVENous Q8H   ??? sodium chloride (NS) flush 5-10 mL  5-10 mL IntraVENous PRN   ??? acetaminophen  (TYLENOL) tablet 650 mg  650 mg Oral Q4H PRN   ??? oxyCODONE-acetaminophen (PERCOCET 10) 10-325 mg per tablet 1 Tab  1 Tab Oral Q4H PRN   ??? naloxone (NARCAN) injection 0.4 mg  0.4 mg IntraVENous PRN   ??? ondansetron (ZOFRAN) injection 4 mg  4 mg IntraVENous Q4H PRN   ??? insulin lispro (HUMALOG) injection   SubCUTAneous AC&HS   ??? glucose chewable tablet 16 g  4 Tab Oral PRN   ??? dextrose (D50W) injection Syrg 12.5-25 g  12.5-25 g IntraVENous PRN   ??? glucagon (GLUCAGEN) injection 1 mg  1 mg IntraMUSCular PRN   ??? polyethylene glycol (MIRALAX) packet 17 g  17 g Oral DAILY   ??? senna-docusate (PERICOLACE) 8.6-50 mg per tablet 1 Tab  1 Tab Oral DAILY   ??? folic acid (FOLVITE) tablet 1 mg  1 mg Oral DAILY   ??? labetalol (NORMODYNE;TRANDATE) 20 mg/4 mL (5 mg/mL) injection 20 mg  20 mg IntraVENous PRN   ??? hydroxyurea (HYDREA) chemo cap 500 mg  500 mg Oral DIALYSIS MON, WED, FRI   ??? heparin (porcine) injection 5,000 Units  5,000 Units SubCUTAneous Q8H   ??? diphenhydrAMINE (BENADRYL) injection 12.5 mg  12.5 mg IntraVENous Q6H PRN   ??? HYDROmorphone (DILAUDID) injection 4 mg  4 mg IntraVENous Q2H PRN   ??? amLODIPine (NORVASC) tablet 10 mg  10 mg Oral CUSTOM DAYS & TIMES   ??? amLODIPine (NORVASC) tablet 10 mg  10 mg Oral CUSTOM DAYS & TIMES   ??? B complex-vitaminC-folic acid (NEPHROCAP) cap  1 Cap Oral DAILY            Lab Review:     Recent Labs   [facility] 07/03/12 0444 07/02/12 0502 07/01/12 0403    WBC 4.2 3.1* 3.5*    HGB 10.2* 8.6* 8.4*    HCT 31.7* 27.0* 26.9*    PLT 176 156 174     Recent Labs   Basename 07/03/12 0444 07/02/12 0502 07/01/12 0403    NA 134* 130* 136    K 5.3* 6.3* 5.6*  CL 97 93* 97    CO2 27 26 30     GLU 99 96 92    BUN 38* 71* 47*    CREA 5.93* 8.42* 6.74*    CA 8.6 8.8 8.7    MG -- -- --    PHOS -- -- --    ALB -- -- --    TBIL -- -- --    SGOT -- -- --    INR -- -- --     Lab Results   Component Value Date/Time    POC GLUCOSE 152 07/03/2012  7:49 AM    POC GLUCOSE 173 07/02/2012  8:57 PM    POC GLUCOSE 135  07/02/2012  4:47 PM    POC GLUCOSE 96 07/02/2012 11:30 AM    POC GLUCOSE 89 07/02/2012  7:15 AM     No results found for this basename: PH:3,PCO2:3,PO2:3,HCO3:3,FIO2:3 in the last 72 hours  No results found for this basename: INR:3 in the last 72 hours    Lab Results   Component Value Date/Time    Specimen Description: BLOOD 06/16/2012 10:12 PM    Specimen Description: BLOOD 05/24/2012 10:47 AM     Lab Results   Component Value Date/Time    Culture result: NO GROWTH 5 DAYS 06/16/2012 10:12 PM    Culture result: NO GROWTH 5 DAYS 05/24/2012 10:47 AM     Total time spent with patient: 25 minutes                  Care Plan discussed with: Patient    Discussed:  Care Plan    Prophylaxis:  Lovenox    Disposition:  Home w/Family           ___________________________________________________    Attending Physician: Gaspar Bidding, MD

## 2012-07-03 NOTE — ED Provider Notes (Signed)
I have personally seen and evaluated patient. I find the patient's history and physical exam are consistent with the PA's NP documentation. I agree with the care provided, treatments rendered, disposition and follow up plan.

## 2012-07-03 NOTE — Progress Notes (Signed)
Problem: Pain  Goal: *Control of Pain  Outcome: Progressing Towards Goal  Patient's pain is a 7-9 on a numeric 0-10 scale. Patient's pain is being controlled with 4 mg Dilaudid Q 2 hrs. Patient is being monitored via continuous pulse ox.     Problem: Falls - Risk of  Goal: *Absence of falls  Outcome: Progressing Towards Goal  Fall precautions in place. Bed in lowest position, side rails up, slip resistant footwear on patient and personal belongings within reach. Patient instructed to use call bell when needing assistance.

## 2012-07-03 NOTE — Progress Notes (Signed)
Rounded on patient and reminded him the explanation of wearing the pulse ox while receiving pain medication; upon entering the room- the patient had just received Dilaudid 4mg  and the pulse ox was not attached- instructed the patient to maintain the pulse ox- applied and reconnected to the instrument- 02 sats currently 99% room air

## 2012-07-03 NOTE — Progress Notes (Signed)
Patient is refusing to wear his continuous pulse ox while receiving 4 mg of Di lauded. Patient educated on reasoning and is declining at this time. Patient's O2 saturation is at 99-100% on room air at this time. Will continue to monitor and assess.

## 2012-07-03 NOTE — Progress Notes (Signed)
NUTRITION     BEST PRACTICE ALERT:   Braden Score: 23      Nutrition: Excellent    Pt discussed in rounds this am. Noted to have hypoglycemia.  Diet: Renal 70-70-70 Diabetic  RD PLAN  Add snacks tid to help prevent hypoglycemia   PAMELA CAMPBELL, RD

## 2012-07-04 LAB — GLUCOSE, POC
Glucose (POC): 153 mg/dL — ABNORMAL HIGH (ref 75–110)
Glucose (POC): 90 mg/dL (ref 75–110)
Glucose (POC): 119 mg/dL — ABNORMAL HIGH (ref 75–110)
Glucose (POC): 151 mg/dL — ABNORMAL HIGH (ref 75–110)

## 2012-07-04 MED ADMIN — HYDROmorphone (DILAUDID) injection 4 mg: INTRAVENOUS | @ 22:00:00 | NDC 00641012121

## 2012-07-04 MED ADMIN — ondansetron (ZOFRAN) injection 4 mg: INTRAVENOUS | @ 16:00:00 | NDC 00409475503

## 2012-07-04 MED ADMIN — calcium acetate (PHOSLO) capsule 1,334 mg: ORAL | @ 21:00:00 | NDC 00054008826

## 2012-07-04 MED ADMIN — HYDROmorphone (DILAUDID) injection 4 mg: INTRAVENOUS | @ 06:00:00 | NDC 00641012121

## 2012-07-04 MED ADMIN — insulin lispro (HUMALOG) injection: SUBCUTANEOUS | @ 21:00:00 | NDC 99999192301

## 2012-07-04 MED ADMIN — epoetin alfa (EPOGEN;PROCRIT) injection 15,000 Units: INTRAVENOUS | @ 21:00:00 | NDC 59676030202

## 2012-07-04 MED ADMIN — insulin lispro (HUMALOG) injection: SUBCUTANEOUS | @ 12:00:00 | NDC 32849075501

## 2012-07-04 MED ADMIN — diphenhydrAMINE (BENADRYL) injection 12.5 mg: INTRAVENOUS | @ 16:00:00 | NDC 63323066401

## 2012-07-04 MED ADMIN — heparin (porcine) injection 5,000 Units: SUBCUTANEOUS | @ 17:00:00 | NDC 00409272301

## 2012-07-04 MED ADMIN — epoetin alfa (EPOGEN;PROCRIT) injection 15,000 Units: INTRAVENOUS | @ 13:00:00 | NDC 59676031000

## 2012-07-04 MED ADMIN — HYDROmorphone (DILAUDID) injection 4 mg: INTRAVENOUS | @ 15:00:00 | NDC 00641012121

## 2012-07-04 MED ADMIN — HYDROmorphone (DILAUDID) injection 4 mg: INTRAVENOUS | @ 16:00:00 | NDC 00641012121

## 2012-07-04 MED ADMIN — diphenhydrAMINE (BENADRYL) injection 12.5 mg: INTRAVENOUS | @ 22:00:00 | NDC 63323066401

## 2012-07-04 MED ADMIN — HYDROmorphone (DILAUDID) injection 4 mg: INTRAVENOUS | @ 18:00:00 | NDC 00641012121

## 2012-07-04 MED ADMIN — diphenhydrAMINE (BENADRYL) injection 12.5 mg: INTRAVENOUS | @ 05:00:00 | NDC 63323066401

## 2012-07-04 MED ADMIN — hydroxyurea (HYDREA) chemo cap 500 mg: ORAL | @ 16:00:00 | NDC 49884072401

## 2012-07-04 MED ADMIN — heparin (porcine) injection 5,000 Units: SUBCUTANEOUS | @ 04:00:00 | NDC 00409272301

## 2012-07-04 MED ADMIN — insulin glargine (LANTUS) injection 4 Units: SUBCUTANEOUS | @ 12:00:00 | NDC 09999222001

## 2012-07-04 MED ADMIN — HYDROmorphone (DILAUDID) injection 4 mg: INTRAVENOUS | @ 04:00:00 | NDC 00641012121

## 2012-07-04 MED ADMIN — HYDROmorphone (DILAUDID) injection 4 mg: INTRAVENOUS | @ 21:00:00 | NDC 00641012121

## 2012-07-04 MED ADMIN — HYDROmorphone (DILAUDID) injection 4 mg: INTRAVENOUS | @ 12:00:00 | NDC 00641012121

## 2012-07-04 MED ADMIN — calcium acetate (PHOSLO) capsule 1,334 mg: ORAL | @ 16:00:00 | NDC 00054008826

## 2012-07-04 MED ADMIN — diphenhydrAMINE (BENADRYL) injection 12.5 mg: INTRAVENOUS | @ 11:00:00 | NDC 63323066401

## 2012-07-04 MED ADMIN — sodium chloride (NS) flush 5-10 mL: INTRAVENOUS | @ 18:00:00 | NDC 87701099893

## 2012-07-04 MED ADMIN — HYDROmorphone (DILAUDID) injection 4 mg: INTRAVENOUS | @ 09:00:00 | NDC 00641012121

## 2012-07-04 MED ADMIN — metoprolol (LOPRESSOR) tablet 100 mg: ORAL | @ 04:00:00 | NDC 50742010810

## 2012-07-04 MED ADMIN — HYDROmorphone (DILAUDID) injection 4 mg: INTRAVENOUS | @ 11:00:00 | NDC 00641012121

## 2012-07-04 MED ADMIN — heparin (porcine) injection 5,000 Units: SUBCUTANEOUS | @ 11:00:00 | NDC 00409272301

## 2012-07-04 MED ADMIN — sodium chloride (NS) flush 5-10 mL: INTRAVENOUS | @ 04:00:00 | NDC 87701099893

## 2012-07-04 MED ADMIN — folic acid (FOLVITE) tablet 1 mg: ORAL | @ 12:00:00 | NDC 00603316232

## 2012-07-04 MED ADMIN — sodium chloride (NS) flush 5-10 mL: INTRAVENOUS | @ 11:00:00 | NDC 87701099893

## 2012-07-04 MED ADMIN — insulin lispro (HUMALOG) injection: SUBCUTANEOUS | @ 16:00:00 | NDC 32849075501

## 2012-07-04 MED ADMIN — calcium acetate (PHOSLO) capsule 1,334 mg: ORAL | @ 12:00:00 | NDC 00054008826

## 2012-07-04 MED ADMIN — HYDROmorphone (DILAUDID) injection 4 mg: INTRAVENOUS | @ 01:00:00 | NDC 00641012121

## 2012-07-04 MED ADMIN — ondansetron (ZOFRAN) injection 4 mg: INTRAVENOUS | @ 12:00:00 | NDC 00409475503

## 2012-07-04 MED FILL — CALCIUM ACETATE 667 MG CAP: 667 mg | ORAL | Qty: 2

## 2012-07-04 MED FILL — DIPHENHYDRAMINE HCL 50 MG/ML IJ SOLN: 50 mg/mL | INTRAMUSCULAR | Qty: 1

## 2012-07-04 MED FILL — HEPARIN (PORCINE) 5,000 UNIT/ML IJ SOLN: 5000 unit/mL | INTRAMUSCULAR | Qty: 1

## 2012-07-04 MED FILL — METOPROLOL TARTRATE 50 MG TAB: 50 mg | ORAL | Qty: 2

## 2012-07-04 MED FILL — HYDROMORPHONE 2 MG/ML INJECTION SOLUTION: 2 mg/mL | INTRAMUSCULAR | Qty: 2

## 2012-07-04 MED FILL — AMLODIPINE 5 MG TAB: 5 mg | ORAL | Qty: 2

## 2012-07-04 MED FILL — FOLIC ACID 1 MG TAB: 1 mg | ORAL | Qty: 1

## 2012-07-04 MED FILL — INSULIN LISPRO 100 UNIT/ML INJECTION: 100 unit/mL | SUBCUTANEOUS | Qty: 1

## 2012-07-04 MED FILL — EPOETIN ALFA 20,000 UNIT/ML IJ SOLN: 20000 unit/mL | INTRAMUSCULAR | Qty: 1

## 2012-07-04 MED FILL — ONDANSETRON (PF) 4 MG/2 ML INJECTION: 4 mg/2 mL | INTRAMUSCULAR | Qty: 2

## 2012-07-04 MED FILL — POLYETHYLENE GLYCOL 3350 17 GRAM (100 %) ORAL POWDER PACKET: 17 gram | ORAL | Qty: 1

## 2012-07-04 MED FILL — HYDROXYUREA 500 MG CAPSULE: 500 mg | ORAL | Qty: 1

## 2012-07-04 MED FILL — INSULIN GLARGINE 100 UNIT/ML INJECTION: 100 unit/mL | SUBCUTANEOUS | Qty: 0.04

## 2012-07-04 MED FILL — SENNA PLUS 8.6 MG-50 MG TABLET: ORAL | Qty: 1

## 2012-07-04 NOTE — Progress Notes (Signed)
Bedside and Verbal shift change report given to Albert Bruckner RN (oncoming nurse) by Janna RN (offgoing nurse).  Report given with SBAR, Kardex, Intake/Output, MAR and Recent Results.

## 2012-07-04 NOTE — Progress Notes (Signed)
[facility] Inpatient Progress Note  [facility]   [address] [place], [place], [place] [address]   [phone]      Assessment & Plan:   ESRD   HD  MWF   HD today  HTN   acceptable  Hyperkalemia   Low k diet dw pt  This might be due to cell lysis:sickle cell crisis and release of Intra cellular K  CKD-MBD   Phoslo  Anemia   Due to CKD and SSDz   EPO with HD           Subjective:   CC:f/up ESRD  HPI: Patient seen on HD    Pain still present     Current Facility-Administered Medications   Medication Dose Route Frequency   ??? levofloxacin (LEVAQUIN) tablet 500 mg  500 mg Oral Q48H   ??? epoetin alfa (EPOGEN;PROCRIT) injection 15,000 Units  15,000 Units IntraVENous Q MON, WED & FRI   ??? insulin glargine (LANTUS) injection 4 Units  4 Units SubCUTAneous DAILY   ??? calcium acetate (PHOSLO) capsule 1,334 mg  2 Cap Oral TID WITH MEALS   ??? metoprolol (LOPRESSOR) tablet 100 mg  100 mg Oral BID   ??? sodium chloride (NS) flush 5-10 mL  5-10 mL IntraVENous Q8H   ??? sodium chloride (NS) flush 5-10 mL  5-10 mL IntraVENous PRN   ??? acetaminophen (TYLENOL) tablet 650 mg  650 mg Oral Q4H PRN   ??? oxyCODONE-acetaminophen (PERCOCET 10) 10-325 mg per tablet 1 Tab  1 Tab Oral Q4H PRN   ??? naloxone (NARCAN) injection 0.4 mg  0.4 mg IntraVENous PRN   ??? ondansetron (ZOFRAN) injection 4 mg  4 mg IntraVENous Q4H PRN   ??? insulin lispro (HUMALOG) injection   SubCUTAneous AC&HS   ??? glucose chewable tablet 16 g  4 Tab Oral PRN   ??? dextrose (D50W) injection Syrg 12.5-25 g  12.5-25 g IntraVENous PRN   ??? glucagon (GLUCAGEN) injection 1 mg  1 mg IntraMUSCular PRN   ??? polyethylene glycol (MIRALAX) packet 17 g  17 g Oral DAILY   ??? senna-docusate (PERICOLACE) 8.6-50 mg per tablet 1 Tab  1 Tab Oral DAILY   ??? folic acid (FOLVITE) tablet 1 mg  1 mg Oral DAILY   ??? labetalol (NORMODYNE;TRANDATE) 20 mg/4 mL (5 mg/mL) injection 20 mg  20 mg IntraVENous PRN   ??? hydroxyurea (HYDREA) chemo cap 500 mg  500 mg Oral DIALYSIS  MON, WED, FRI   ??? heparin (porcine) injection 5,000 Units  5,000 Units SubCUTAneous Q8H   ??? diphenhydrAMINE (BENADRYL) injection 12.5 mg  12.5 mg IntraVENous Q6H PRN   ??? HYDROmorphone (DILAUDID) injection 4 mg  4 mg IntraVENous Q2H PRN   ??? amLODIPine (NORVASC) tablet 10 mg  10 mg Oral CUSTOM DAYS & TIMES   ??? amLODIPine (NORVASC) tablet 10 mg  10 mg Oral CUSTOM DAYS & TIMES   ??? B complex-vitaminC-folic acid (NEPHROCAP) cap  1 Cap Oral DAILY          Objective:     Vitals:  Blood pressure 134/78, pulse 76, temperature 97.7 ??F (36.5 ??C), resp. rate 18, height 5\' 5"  (1.651 m), weight 90.583 kg (199 lb 11.2 oz), SpO2 96.00%.  Temp (24hrs), Avg:97.8 ??F (36.6 ??C), Min:97.5 ??F (36.4 ??C), Max:98 ??F (36.7 ??C)      Intake and Output:  07/17 0700 - 07/17 1859  In: 480 [P.O.:480]  Out: -   07/15 1900 - 07/17 0659  In: 880 [  P.O.:880]  Out: -     Physical Exam:                Patient is intubated:  no    Physical Examination:   GENERAL ASSESSMENT: NAD     CHEST: CTA  HEART: S1S2  2 edema          ECG/rhythm:    Data Review      No results found for this basename: ITNL in the last 72 hours   No results found for this basename: CPK:3,CKMB:3,TROIQ:3, in the last 72 hours  Recent Labs   [facility] 07/03/12 0444 07/02/12 0502    NA 134* 130*    K 5.3* 6.3*    CL 97 93*    CO2 27 26    BUN 38* 71*    CREA 5.93* 8.42*    GLU 99 96    PHOS -- --    MG -- --    CA 8.6 8.8    ALB -- --    WBC 4.2 3.1*    HGB 10.2* 8.6*    HCT 31.7* 27.0*    PLT 176 156      No results found for this basename: INR:3,PTP:3,APTT:3, in the last 72 hours  Needs: urine analysis, urine sodium, protein and creatinine  No results found for this basename: NAU,   KU,  CREAU         dw pt      [facility]  [address]  [place], [place] [address]  Phone - [phone]  Fax - [phone]

## 2012-07-04 NOTE — Procedures (Signed)
[facility] [place] [facility]                         [phone]  Vitals Pre Post Assessment Pre Post   BP 198/104 126/76 LOC A&Ox3 A&Ox3   HR 70 76 Lungs diminished CTA   Temp 98.6 98.3 Cardiac SR SR   Resp 16 16 Skin W/D/I W/D/I   Weight 90.5kg 84.6kg Edema trace trace      Pain 8 back and legs 5 back     Orders   Duration: Start: 0752 End: 1155 Total: 4   Dialyzer: Revaclear   K Bath: 2   Ca Bath: 2   Na / Bicarb: 140/35   Target Fluid Removal: 5.5kg     Access   Type & Location: Rt upper arm AVF   Comments:             Prepped per protocol + B/T cannulated with 15g  x2                    Labs   Hep B status / date: antibod 62 01/24/12   Obtained/Reviewed  Critical Results Called reviewed  n/a     Meds Given   Name Dose Route   Procrit 98119 units IV               Total Liters Process: 96   Net Fluid Removed: 6 liters      Comments   Time Out Done: yes   Primary Nurse Rpt Pre: Rayetta Pigg   Primary Nurse Rpt Post: Janna,RN   Pt Education: Yes/ diet   Tx Summary: Tol tx well. All poss blood returned with NS rinse back. Needles pulled hemostasis achieved post tx after 3 min pressure held.Dsg applied.+ bruit and thrill.Report given to staff RN. Side rails up x1 call bell in hand. Bed in low position.Pain at start was medicated Print production planner every 2hrs. Pain level at end of tx also treated by staff RN.

## 2012-07-04 NOTE — Progress Notes (Signed)
Bedside and Verbal shift change report given to Albert Bruckner RN (oncoming nurse) by Tamara RN (offgoing nurse).  Report given with SBAR, Kardex, Intake/Output, MAR and Recent Results.

## 2012-07-04 NOTE — Procedures (Signed)
Procedures  by Rodell Perna, RN at 07/04/12 [phone]                Author: Rodell Perna, RN  Service: --  Author Type: Registered Nurse       Filed: 07/04/12 1205  Date of Service: 07/04/12 0854  Status: Signed          Editor: Rodell Perna, RN (Registered Nurse)                                   [facility]                         [phone]          Vitals  Pre  Post  Assessment  Pre  Post            BP  198/104  126/76  LOC  A&Ox3  A&Ox3            HR  70  76  Lungs  diminished  CTA            Temp  98.6  98.3  Cardiac  SR  SR     Resp  16  16  Skin  W/D/I  W/D/I     Weight  90.5kg  84.6kg  Edema  trace  trace                  Pain  8 back and legs  5 back          Orders             Duration:  Start:  0752  End:  1155  Total:  4        Dialyzer:  Revaclear     K Bath:  2     Ca Bath:  2        Na / Bicarb:  140/35        Target Fluid Removal:  5.5kg          Access        Type & Location:  Rt upper arm AVF       Comments:              Prepped per protocol + B/T cannulated with 15g  x2                         Labs        Hep B status / date:  antibod 62 01/24/12        Obtained/Reviewed   Critical Results Called  reviewed   n/a          Meds Given         Name  Dose  Route         Procrit  98119 units  IV                                     Total Liters Process:  96        Net Fluid Removed:  6 liters           Comments        Time Out Done:  yes        Primary Nurse Rpt  Pre:  Rayetta Pigg     Primary Nurse Rpt Post:  Rayetta Pigg        Pt Education:  Yes/ diet        Tx Summary:  Tol tx well. All poss blood returned with NS rinse back. Needles pulled hemostasis achieved post tx after 3 min pressure held.Dsg applied.+  bruit and thrill.Report given to staff RN. Side rails up x1 call bell in hand. Bed in low position.Pain at start was medicated Print production planner every 2hrs. Pain level at end of tx also treated by staff RN.

## 2012-07-04 NOTE — Progress Notes (Addendum)
Medical Progress Note      NAME: Tyler Ballard   DOB:  [birth year]  MRM:  [id number]    Date/Time: 07/04/2012        Principal Problem:   *Sickle cell crisis (05/24/2012)  Active Problems:     Hyperkalemia (06/28/2012)     ESRD on hemodialysis (06/28/2012)     Unspecified essential hypertension (06/28/2012)     Type II or unspecified type diabetes mellitus with renal manifestations, not stated as uncontrolled (06/28/2012)        Assessment/Plan:   Sickle cell crisis (05/24/2012) - unclear trigger.   No fevers. No leukocytosis but ?bronchitis.   -dilaudid 4mg  IV q2h PRN; still nauseated and reluctant to discharge if unable to tolerate PO meds  -add phenergan   -continue O2   -bowel regimen considering high narcotic use   -start folic acid   -continue home hydroxyurea 500mg  after HD   -s/p course of levofloxacin for bronchitis     Hyperkalemia (06/28/2012) - per pt, on HD MWF. Has not missed any sessions.  S/p kayexalate  -monitor on tele   -hold ACE I   -renal on board; s/p HD today      ESRD on hemodialysis (06/28/2012) - on HD MWF      Unspecified essential hypertension (06/28/2012)   -continue amlodipine and metoprolol   -hold ACE I due to hyperK+     Type II or unspecified type diabetes mellitus with renal manifestations, not stated as uncontrolled (06/28/2012)   -ISS   -BG checks AC TID and qHS   -Lantus 4u qHS   -diabetic diet     Nausea and vomiting. Anti emetics PRN.     Subjective:     Chief Complaint:   "I still feel sick"    Pain persists. Pt still with poor PO intake and nausea/vomiting. No reported constipation.     ROS:  (bold if positive, if negative)    Nausea/Vomit  Tolerating PT  Tolerating Diet          Objective:       Vitals:          Last 24hrs VS reviewed since prior progress note. Most recent are:    Visit Vitals   Item Reading   ??? BP 136/76   ??? Pulse 77   ??? Temp 97.7 ??F (36.5 ??C)   ??? Resp 18   ??? Ht 5\' 5"  (1.651 m)   ??? Wt 199 lb 11.2 oz   ??? BMI 33.23 kg/m2   ??? SpO2 96%     SpO2 Readings from Last  6 Encounters:   07/04/12 96%   06/19/12 99%   06/12/12 96%    O2 Flow Rate (L/min): 2 l/min       Intake/Output Summary (Last 24 hours) at 07/04/12 1118  Last data filed at 07/04/12 0925   Gross per 24 hour   Intake   1360 ml   Output      0 ml   Net   1360 ml          Exam:     Physical Exam:    Gen:  Well-developed, well-nourished, in no acute distress  HEENT:  Pink conjunctivae, PERRL, hearing intact to voice, moist mucous membranes  Neck:  Supple, without masses, thyroid non-tender  Resp:  No accessory muscle use, clear breath sounds without wheezes rales or rhonchi  Card:  No murmurs, normal S1, S2 without thrills, bruits.+  peripheral edema  Abd:  Soft,  non-tender, non-distended, normoactive bowel sounds are present, no palpable organomegaly and no detectable hernias  Lymph:  No cervical or inguinal adenopathy  Musc:  No cyanosis or clubbing  Skin:  No rashes or ulcers, skin turgor is good  Neuro:  Cranial nerves are grossly intact, no focal motor weakness, follows commands appropriately  Psych:  Good insight, oriented to person, place and time, alert      Medications Reviewed: (see below)    Lab Data Reviewed: (see below)    ______________________________________________________________________    Medications:     Current Facility-Administered Medications   Medication Dose Route Frequency   ??? promethazine (PHENERGAN) injection 12.5 mg  12.5 mg IntraVENous Q6H PRN   ??? levofloxacin (LEVAQUIN) tablet 500 mg  500 mg Oral Q48H   ??? epoetin alfa (EPOGEN;PROCRIT) injection 15,000 Units  15,000 Units IntraVENous Q MON, WED & FRI   ??? insulin glargine (LANTUS) injection 4 Units  4 Units SubCUTAneous DAILY   ??? calcium acetate (PHOSLO) capsule 1,334 mg  2 Cap Oral TID WITH MEALS   ??? metoprolol (LOPRESSOR) tablet 100 mg  100 mg Oral BID   ??? sodium chloride (NS) flush 5-10 mL  5-10 mL IntraVENous Q8H   ??? sodium chloride (NS) flush 5-10 mL  5-10 mL IntraVENous PRN   ??? acetaminophen (TYLENOL) tablet 650 mg  650 mg Oral Q4H  PRN   ??? oxyCODONE-acetaminophen (PERCOCET 10) 10-325 mg per tablet 1 Tab  1 Tab Oral Q4H PRN   ??? naloxone (NARCAN) injection 0.4 mg  0.4 mg IntraVENous PRN   ??? ondansetron (ZOFRAN) injection 4 mg  4 mg IntraVENous Q4H PRN   ??? insulin lispro (HUMALOG) injection   SubCUTAneous AC&HS   ??? glucose chewable tablet 16 g  4 Tab Oral PRN   ??? dextrose (D50W) injection Syrg 12.5-25 g  12.5-25 g IntraVENous PRN   ??? glucagon (GLUCAGEN) injection 1 mg  1 mg IntraMUSCular PRN   ??? polyethylene glycol (MIRALAX) packet 17 g  17 g Oral DAILY   ??? senna-docusate (PERICOLACE) 8.6-50 mg per tablet 1 Tab  1 Tab Oral DAILY   ??? folic acid (FOLVITE) tablet 1 mg  1 mg Oral DAILY   ??? labetalol (NORMODYNE;TRANDATE) 20 mg/4 mL (5 mg/mL) injection 20 mg  20 mg IntraVENous PRN   ??? hydroxyurea (HYDREA) chemo cap 500 mg  500 mg Oral DIALYSIS MON, WED, FRI   ??? heparin (porcine) injection 5,000 Units  5,000 Units SubCUTAneous Q8H   ??? diphenhydrAMINE (BENADRYL) injection 12.5 mg  12.5 mg IntraVENous Q6H PRN   ??? HYDROmorphone (DILAUDID) injection 4 mg  4 mg IntraVENous Q2H PRN   ??? amLODIPine (NORVASC) tablet 10 mg  10 mg Oral CUSTOM DAYS & TIMES   ??? amLODIPine (NORVASC) tablet 10 mg  10 mg Oral CUSTOM DAYS & TIMES   ??? B complex-vitaminC-folic acid (NEPHROCAP) cap  1 Cap Oral DAILY            Lab Review:     Recent Labs   [facility] 07/03/12 0444 07/02/12 0502    WBC 4.2 3.1*    HGB 10.2* 8.6*    HCT 31.7* 27.0*    PLT 176 156     Recent Labs   Basename 07/03/12 0444 07/02/12 0502    NA 134* 130*    K 5.3* 6.3*    CL 97 93*    CO2 27 26    GLU 99 96    BUN 38* 71*    CREA  5.93* 8.42*    CA 8.6 8.8    MG -- --    PHOS -- --    ALB -- --    TBIL -- --    SGOT -- --    INR -- --     Lab Results   Component Value Date/Time    POC GLUCOSE 90 07/04/2012  7:13 AM    POC GLUCOSE 153 07/03/2012  9:29 PM    POC GLUCOSE 105 07/03/2012  4:38 PM    POC GLUCOSE 91 07/03/2012 12:33 PM    POC GLUCOSE 72 07/03/2012 12:17 PM     No results found for this basename:  PH:3,PCO2:3,PO2:3,HCO3:3,FIO2:3 in the last 72 hours  No results found for this basename: INR:3 in the last 72 hours    Lab Results   Component Value Date/Time    Specimen Description: BLOOD 06/16/2012 10:12 PM    Specimen Description: BLOOD 05/24/2012 10:47 AM     Lab Results   Component Value Date/Time    Culture result: NO GROWTH 5 DAYS 06/16/2012 10:12 PM    Culture result: NO GROWTH 5 DAYS 05/24/2012 10:47 AM     Total time spent with patient: 26 minutes                  Care Plan discussed with: Patient    Discussed:  Care Plan    Prophylaxis:  Heparin    Disposition:  Home w/Family           ___________________________________________________    Attending Physician: Gaspar Bidding, MD

## 2012-07-04 NOTE — Other (Addendum)
DTC Progress Note    Recommendations/ Comments: If appropriate, please consider changing insulin correction scale to high sensitivity in light of ESRD; yesterday received 2 units for POC glucose of 152 and had multiple low BS before able to get back above 80mg /dl    Chart reviewed on [employer].    Patient is a 31 y.o. male with known  Type 2 Diabetes on insulin injections: Novolog by scale at home.    A1c:   No results found for this basename: HBA1C, HGBE8       POC Glucose last 24hrs:   Lab Results   Component Value Date/Time    POC GLUCOSE 90 07/04/2012  7:13 AM    POC GLUCOSE 153 07/03/2012  9:29 PM    POC GLUCOSE 105 07/03/2012  4:38 PM    POC GLUCOSE 91 07/03/2012 12:33 PM    POC GLUCOSE 72 07/03/2012 12:17 PM    POC GLUCOSE 52 07/03/2012 11:58 AM    POC GLUCOSE 49 07/03/2012 11:34 AM    POC GLUCOSE 50 07/03/2012 11:20 AM         Lab Results   Component Value Date/Time    Creatinine 5.93 07/03/2012  4:44 AM       PO intake: Patient Vitals for the past 72 hrs:   % Diet Eaten   07/04/12 0925 100 %   07/02/12 1724 50 %         Current hospital DM medications: lispro insulin correction scale; lantus 4 units am    Will continue to follow as needed.    Thank you.    Evlyn Kanner, RD CDE

## 2012-07-04 NOTE — Progress Notes (Signed)
Bedside and Verbal shift change report given to Janna Gambino RN (oncoming nurse) by Albert Bruckner RN (offgoing nurse).  Report given with SBAR, Kardex, Procedure Summary, Intake/Output, MAR and Recent Results.

## 2012-07-05 DIAGNOSIS — D5700 Hb-SS disease with crisis, unspecified: Secondary | ICD-10-CM

## 2012-07-05 LAB — GLUCOSE, POC
Glucose (POC): 102 mg/dL (ref 75–110)
Glucose (POC): 139 mg/dL — ABNORMAL HIGH (ref 75–110)

## 2012-07-05 MED ORDER — ONDANSETRON 8 MG TAB, RAPID DISSOLVE
8 mg | ORAL_TABLET | Freq: Three times a day (TID) | ORAL | Status: DC | PRN
Start: 2012-07-05 — End: 2013-09-06

## 2012-07-05 MED ORDER — HYDROMORPHONE 4 MG TAB
4 mg | ORAL_TABLET | ORAL | Status: AC | PRN
Start: 2012-07-05 — End: 2012-08-04

## 2012-07-05 MED ADMIN — HYDROmorphone (DILAUDID) injection 4 mg: INTRAVENOUS | @ 11:00:00 | NDC 00641012121

## 2012-07-05 MED ADMIN — diphenhydrAMINE (BENADRYL) injection 12.5 mg: INTRAVENOUS | @ 05:00:00 | NDC 63323066401

## 2012-07-05 MED ADMIN — HYDROmorphone (DILAUDID) injection 4 mg: INTRAVENOUS | @ 07:00:00 | NDC 00641012121

## 2012-07-05 MED ADMIN — insulin lispro (HUMALOG) injection: SUBCUTANEOUS | @ 12:00:00 | NDC 32849075501

## 2012-07-05 MED ADMIN — sodium chloride (NS) flush 5-10 mL: INTRAVENOUS | @ 02:00:00 | NDC 82903065462

## 2012-07-05 MED ADMIN — calcium acetate (PHOSLO) capsule 1,334 mg: ORAL | @ 13:00:00 | NDC 00054008826

## 2012-07-05 MED ADMIN — HYDROmorphone (DILAUDID) injection 4 mg: INTRAVENOUS | @ 08:00:00 | NDC 00641012121

## 2012-07-05 MED ADMIN — metoprolol (LOPRESSOR) tablet 100 mg: ORAL | @ 13:00:00 | NDC 50742010810

## 2012-07-05 MED ADMIN — HYDROmorphone (DILAUDID) injection 4 mg: INTRAVENOUS | @ 05:00:00 | NDC 00641012121

## 2012-07-05 MED ADMIN — diphenhydrAMINE (BENADRYL) injection 12.5 mg: INTRAVENOUS | @ 11:00:00 | NDC 63323066401

## 2012-07-05 MED ADMIN — heparin (porcine) injection 5,000 Units: SUBCUTANEOUS | @ 02:00:00 | NDC 00409272301

## 2012-07-05 MED ADMIN — insulin glargine (LANTUS) injection 4 Units: SUBCUTANEOUS | @ 13:00:00 | NDC 09999222001

## 2012-07-05 MED ADMIN — B complex-vitaminC-folic acid (NEPHROCAP) cap: ORAL | @ 13:00:00 | NDC 70199002611

## 2012-07-05 MED ADMIN — HYDROmorphone (DILAUDID) injection 4 mg: INTRAVENOUS | @ 02:00:00 | NDC 00641012121

## 2012-07-05 MED ADMIN — heparin (porcine) injection 5,000 Units: SUBCUTANEOUS | @ 11:00:00 | NDC 00409272301

## 2012-07-05 MED ADMIN — sodium chloride (NS) flush 5-10 mL: INTRAVENOUS | @ 11:00:00 | NDC 87701099893

## 2012-07-05 MED ADMIN — folic acid (FOLVITE) tablet 1 mg: ORAL | @ 13:00:00 | NDC 00603316232

## 2012-07-05 MED ADMIN — HYDROmorphone (DILAUDID) injection 4 mg: INTRAVENOUS | @ 01:00:00 | NDC 00641012121

## 2012-07-05 MED ADMIN — amLODIPine (NORVASC) tablet 10 mg: ORAL | @ 13:00:00 | NDC 59762153002

## 2012-07-05 MED ADMIN — HYDROmorphone (DILAUDID) injection 4 mg: INTRAVENOUS | @ 13:00:00 | NDC 00641012121

## 2012-07-05 MED FILL — FOLIC ACID 1 MG TAB: 1 mg | ORAL | Qty: 1

## 2012-07-05 MED FILL — SENNA PLUS 8.6 MG-50 MG TABLET: ORAL | Qty: 1

## 2012-07-05 MED FILL — HYDROXYUREA 500 MG CAPSULE: 500 mg | ORAL | Qty: 1

## 2012-07-05 MED FILL — HYDROMORPHONE 2 MG/ML INJECTION SOLUTION: 2 mg/mL | INTRAMUSCULAR | Qty: 2

## 2012-07-05 MED FILL — HEPARIN (PORCINE) 5,000 UNIT/ML IJ SOLN: 5000 unit/mL | INTRAMUSCULAR | Qty: 1

## 2012-07-05 MED FILL — RENAL CAPS 1 MG CAPSULE: 1 mg | ORAL | Qty: 1

## 2012-07-05 MED FILL — METOPROLOL TARTRATE 50 MG TAB: 50 mg | ORAL | Qty: 2

## 2012-07-05 MED FILL — AMLODIPINE 5 MG TAB: 5 mg | ORAL | Qty: 2

## 2012-07-05 MED FILL — CALCIUM ACETATE 667 MG CAP: 667 mg | ORAL | Qty: 2

## 2012-07-05 MED FILL — DIPHENHYDRAMINE HCL 50 MG/ML IJ SOLN: 50 mg/mL | INTRAMUSCULAR | Qty: 1

## 2012-07-05 MED FILL — POLYETHYLENE GLYCOL 3350 17 GRAM (100 %) ORAL POWDER PACKET: 17 gram | ORAL | Qty: 1

## 2012-07-05 NOTE — Discharge Summary (Signed)
Physician Discharge Summary     Patient ID:  Tyler Ballard  [id number]  30 y.o.  [birth year]    Admit date: 06/28/2012    Discharge date and time: 07/05/2012    Admission Diagnoses: Sickle cell crisis    Discharge Diagnoses:    Sickle cell crisis (05/24/2012) - unclear trigger. No fevers. No leukocytosis but ?bronchitis s/p complete course of levofloxacin. Initially on IVF's, IV dilaudid and IV anti-emetics. Transitioned to home regimen. Folic acid and hydroxyurea continued      Hyperkalemia (06/28/2012) - per pt, on HD MWF. Has not missed any sessions. S/p kayexalate. Holding ACE I    ESRD on hemodialysis (06/28/2012) - on HD MWF     Unspecified essential hypertension (06/28/2012): amlodipine and metoprolol continued. ACE I on hold due to hyperkalemia     Type II or unspecified type diabetes mellitus with renal manifestations, not stated as uncontrolled (06/28/2012) - continued home Lantus     PCP: Phys Other, MD     Consults: none    Discharge Exam:  Please refer to progress note from today.    Disposition: home    Patient Instructions:   Cannot display discharge medications since this patient is not currently admitted.    Activity: activity as tolerated  Diet: Diabetic Diet  Wound Care: None needed    Follow-up with Phys Other, MD in 2 weeks.  Follow-up tests/labs: as needed     Approximate time spent in patient care on day of discharge: 32 minutes     Signed:  Gaspar Bidding, MD  07/05/2012  3:02 PM

## 2012-07-05 NOTE — ED Provider Notes (Signed)
HPI Comments: 31 y.o.male who presents to the ED with sickle cell pain.  Pt had back and leg pain today consistent with previous sickle cell crisis.  He did not eat anything for 2 days due to nausea and pain.  He had an EJ in his neck 4-5 months ago.  Pt reported relief in the past from dilaudid and phenergan.  He denied chest pain, SOB, difficulty swallowing, fever, sore throat, cough, vomiting, diarrhea, abdominal pain, headache, numbness, tingling, or other medically acute issues.    Social HX: Current smoker; no drugs or alcohol      Note written by Novella Olive, Scribe, as dictated by Christella Noa, MD 10:20 PM        The history is provided by the patient.        Past Medical History   Diagnosis Date   ??? Sickle cell anemia    ??? Diabetes    ??? Hypertension         Past Surgical History   Procedure Date   ??? Hx orthopaedic      RIGHT HIP         History reviewed. No pertinent family history.     History     Social History   ??? Marital Status: N/A     Spouse Name: N/A     Number of Children: N/A   ??? Years of Education: N/A     Occupational History   ??? Not on file.     Social History Main Topics   ??? Smoking status: Current Everyday Smoker   ??? Smokeless tobacco: Not on file   ??? Alcohol Use: No   ??? Drug Use:    ??? Sexually Active:      Other Topics Concern   ??? Not on file     Social History Narrative   ??? No narrative on file                  ALLERGIES: Review of patient's allergies indicates no known allergies.      Review of Systems   Constitutional: Negative for fever, chills, diaphoresis and appetite change.   HENT: Negative for ear pain, congestion, sore throat, rhinorrhea, trouble swallowing, neck pain and neck stiffness.    Eyes: Negative for pain and visual disturbance.   Respiratory: Negative for cough, shortness of breath and wheezing.    Cardiovascular: Negative for chest pain, palpitations and leg swelling.   Gastrointestinal: Positive for nausea. Negative for vomiting, abdominal pain, diarrhea and  constipation.   Genitourinary: Negative for dysuria, urgency, frequency, flank pain and decreased urine volume.   Musculoskeletal: Positive for back pain. Negative for myalgias.        Leg pain   Skin: Negative for rash.   Neurological: Negative.  Negative for tremors, seizures, syncope, facial asymmetry, speech difficulty and headaches.   Hematological: Negative.    Psychiatric/Behavioral: Negative for confusion.   All other systems reviewed and are negative.        Filed Vitals:    07/05/12 2155 07/05/12 2203   BP: 176/94    Temp: 98.4 ??F (36.9 ??C)    Resp: 20    Height: 5\' 5"  (1.651 m)    Weight: 79.379 kg (175 lb)    SpO2: 100% 100%            Physical Exam   Nursing note and vitals reviewed.  Constitutional: He is oriented to person, place, and time. He appears well-developed and well-nourished.  No distress.        Chronically ill appearing   HENT:   Head: Normocephalic and atraumatic.   Nose: Nose normal.   Eyes: Conjunctivae and EOM are normal. Pupils are equal, round, and reactive to light. No scleral icterus.   Neck: Normal range of motion. Neck supple. No JVD present. No tracheal deviation present.   Cardiovascular: Normal rate, regular rhythm, normal heart sounds and intact distal pulses.  Exam reveals no gallop and no friction rub.    No murmur heard.  Pulmonary/Chest: Effort normal and breath sounds normal. He has no wheezes. He has no rales.   Abdominal: Soft. Bowel sounds are normal. He exhibits no distension. There is no tenderness. There is no rebound and no guarding.   Musculoskeletal: Normal range of motion. He exhibits no edema.   Neurological: He is alert and oriented to person, place, and time. He has normal reflexes.        oriented   Skin: Skin is warm and dry. No rash noted. No pallor.   Psychiatric: He has a normal mood and affect. His behavior is normal.        MDM    Procedures    10:39 PM: Laney Potash RN reports that pt does not want to take compazine because it makes him restless.  He also  requests more dilaudid.    Note written by Novella Olive, Scribe, as dictated by Christella Noa, MD 10:39 PM    11:14 PM: Christella Noa, MD spoke to the pt.  He is still hurting.  He says he normally gets admitted for these sx.  Note written by Novella Olive, Scribe, as dictated by Christella Noa, MD 11:14 PM    11:27 PM: Pt has a creatinine of 8.  Will admit to the hospitalist with sickle cell disease and end stage renal disease.  Note written by Novella Olive, Scribe, as dictated by Christella Noa, MD 11:28 PM    11:29 PM: Pt told Christella Noa, MD that he is a dialysis pt and he forgot to mention during original HPI and ROS.   Note written by Novella Olive, Scribe, as dictated by Christella Noa, MD 11:30 PM

## 2012-07-05 NOTE — H&P (Signed)
Dictated #[id number]  - Sickle cell pain crisis, CKD4

## 2012-07-05 NOTE — Progress Notes (Signed)
Discharge instructions discussed with pt, EJ removed without incident, pt signs hard copy of instructions as signature pad in room was not working, scripts provided questions answered, Pt asks for cab voucher to get home, when I asked him what address we would take him to he explained [place] [employer] [place] [facility], I informed Charge nurse ashley loving and Dr Sudie Bailey they both say that is fine, will arrange transport

## 2012-07-05 NOTE — ED Notes (Signed)
Patient has had N/V since last night, and has not been able to keep anything down. He is rating his pain 8/10 in his back and legs. Denies any fever, diarrhea. On monitor x2, call bell within reach, will continue to monitor.

## 2012-07-05 NOTE — Progress Notes (Signed)
Problem: Pain  Goal: *Control of Pain  Outcome: Progressing Towards Goal  Pt states the current regimen is controlling his pain well

## 2012-07-05 NOTE — Progress Notes (Signed)
Bedside and Verbal shift change report given to Dalbert Batman RN (oncoming nurse) by Evlyn Courier RN (offgoing nurse).  Report given with SBAR, Kardex, Procedure Summary, Intake/Output, MAR and Recent Results.

## 2012-07-05 NOTE — ED Notes (Signed)
Pt arrives from home for sickle cell pain in his upper and lower back and bilateral legs. Pt ambulatory with steady gait in triage. Pt reports his hematologist is Dr. Dannielle Huh in [place].

## 2012-07-05 NOTE — ED Notes (Signed)
Fluid rate slowed to 5101ml/hr. Patient VSS, patient continuing to rate pain 8/10. Will continue to monitor.

## 2012-07-05 NOTE — Progress Notes (Signed)
Medical Progress Note      NAME: Tyler Ballard   DOB:  [birth year]  MRM:  [id number]    Date/Time: 07/05/2012        Active Problems:   ESRD on hemodialysis (06/28/2012)     Unspecified essential hypertension (06/28/2012)     Type II or unspecified type diabetes mellitus with renal manifestations, not stated as uncontrolled (06/28/2012)        Assessment/Plan:   Sickle cell crisis (05/24/2012) - unclear trigger.   No fevers. No leukocytosis but ?bronchitis.   -dilaudid 4mg  IV q2h PRN  -added phenergan; tolerating PO  -continue O2   -bowel regimen considering high narcotic use   -start folic acid   -continue home hydroxyurea 500mg  after HD   -s/p course of levofloxacin for bronchitis     Hyperkalemia (06/28/2012) - per pt, on HD MWF. Has not missed any sessions.  S/p kayexalate  -monitor on tele   -hold ACE I   -renal on board; s/p HD today      ESRD on hemodialysis (06/28/2012) - on HD MWF      Unspecified essential hypertension (06/28/2012)   -continue amlodipine and metoprolol   -hold ACE I due to hyperK+     Type II or unspecified type diabetes mellitus with renal manifestations, not stated as uncontrolled (06/28/2012)   -ISS   -BG checks AC TID and qHS   -Lantus 4u qHS   -diabetic diet     Nausea and vomiting. Anti emetics PRN.     Subjective:     Chief Complaint:   "I am okay I guess"    Pain present, but tolerating PO. Amenable to discharge    ROS:  (bold if positive, if negative)    Nausea/Vomit improved   Tolerating PT  Tolerating Diet          Objective:       Vitals:          Last 24hrs VS reviewed since prior progress note. Most recent are:    Visit Vitals   Item Reading   ??? BP 141/81   ??? Pulse 72   ??? Temp 97.8 ??F (36.6 ??C)   ??? Resp 18   ??? Ht 5\' 5"  (1.651 m)   ??? Wt 191 lb 9.6 oz   ??? BMI 31.88 kg/m2   ??? SpO2 93%     SpO2 Readings from Last 6 Encounters:   07/05/12 93%   06/19/12 99%   06/12/12 96%    O2 Flow Rate (L/min): 2 l/min       Intake/Output Summary (Last 24 hours) at 07/05/12 1019  Last data filed  at 07/05/12 0650   Gross per 24 hour   Intake   1060 ml   Output   6000 ml   Net  -4940 ml          Exam:     Physical Exam:    Gen:  Well-developed, well-nourished, in no acute distress  HEENT:  Pink conjunctivae, PERRL, hearing intact to voice, moist mucous membranes  Neck:  Supple, without masses, thyroid non-tender  Resp:  No accessory muscle use, clear breath sounds without wheezes rales or rhonchi  Card:  No murmurs, normal S1, S2 without thrills, bruits.+  peripheral edema  Abd:  Soft, non-tender, non-distended, normoactive bowel sounds are present, no palpable organomegaly and no detectable hernias  Lymph:  No cervical or inguinal adenopathy  Musc:  No cyanosis or clubbing  Skin:  No rashes or  ulcers, skin turgor is good  Neuro:  Cranial nerves are grossly intact, no focal motor weakness, follows commands appropriately  Psych:  Good insight, oriented to person, place and time, alert      Medications Reviewed: (see below)    Lab Data Reviewed: (see below)    ______________________________________________________________________    Medications:     Current Facility-Administered Medications   Medication Dose Route Frequency   ??? insulin lispro (HUMALOG) injection   SubCUTAneous AC&HS   ??? prochlorperazine (COMPAZINE) injection 5 mg  5 mg IntraVENous Q6H PRN   ??? epoetin alfa (EPOGEN;PROCRIT) injection 15,000 Units  15,000 Units IntraVENous Q MON, WED & FRI   ??? insulin glargine (LANTUS) injection 4 Units  4 Units SubCUTAneous DAILY   ??? calcium acetate (PHOSLO) capsule 1,334 mg  2 Cap Oral TID WITH MEALS   ??? metoprolol (LOPRESSOR) tablet 100 mg  100 mg Oral BID   ??? sodium chloride (NS) flush 5-10 mL  5-10 mL IntraVENous Q8H   ??? sodium chloride (NS) flush 5-10 mL  5-10 mL IntraVENous PRN   ??? acetaminophen (TYLENOL) tablet 650 mg  650 mg Oral Q4H PRN   ??? oxyCODONE-acetaminophen (PERCOCET 10) 10-325 mg per tablet 1 Tab  1 Tab Oral Q4H PRN   ??? naloxone (NARCAN) injection 0.4 mg  0.4 mg IntraVENous PRN   ??? ondansetron  (ZOFRAN) injection 4 mg  4 mg IntraVENous Q4H PRN   ??? glucose chewable tablet 16 g  4 Tab Oral PRN   ??? dextrose (D50W) injection Syrg 12.5-25 g  12.5-25 g IntraVENous PRN   ??? glucagon (GLUCAGEN) injection 1 mg  1 mg IntraMUSCular PRN   ??? polyethylene glycol (MIRALAX) packet 17 g  17 g Oral DAILY   ??? senna-docusate (PERICOLACE) 8.6-50 mg per tablet 1 Tab  1 Tab Oral DAILY   ??? folic acid (FOLVITE) tablet 1 mg  1 mg Oral DAILY   ??? labetalol (NORMODYNE;TRANDATE) 20 mg/4 mL (5 mg/mL) injection 20 mg  20 mg IntraVENous PRN   ??? hydroxyurea (HYDREA) chemo cap 500 mg  500 mg Oral DIALYSIS MON, WED, FRI   ??? heparin (porcine) injection 5,000 Units  5,000 Units SubCUTAneous Q8H   ??? diphenhydrAMINE (BENADRYL) injection 12.5 mg  12.5 mg IntraVENous Q6H PRN   ??? HYDROmorphone (DILAUDID) injection 4 mg  4 mg IntraVENous Q2H PRN   ??? amLODIPine (NORVASC) tablet 10 mg  10 mg Oral CUSTOM DAYS & TIMES   ??? amLODIPine (NORVASC) tablet 10 mg  10 mg Oral CUSTOM DAYS & TIMES   ??? B complex-vitaminC-folic acid (NEPHROCAP) cap  1 Cap Oral DAILY            Lab Review:     Recent Labs   [facility] 07/03/12 0444    WBC 4.2    HGB 10.2*    HCT 31.7*    PLT 176     Recent Labs   Basename 07/03/12 0444    NA 134*    K 5.3*    CL 97    CO2 27    GLU 99    BUN 38*    CREA 5.93*    CA 8.6    MG --    PHOS --    ALB --    TBIL --    SGOT --    INR --     Lab Results   Component Value Date/Time    POC GLUCOSE 102 07/05/2012  7:45 AM    POC GLUCOSE  139 07/04/2012  9:34 PM    POC GLUCOSE 151 07/04/2012  4:39 PM    POC GLUCOSE 119 07/04/2012 11:35 AM    POC GLUCOSE 90 07/04/2012  7:13 AM     No results found for this basename: PH:3,PCO2:3,PO2:3,HCO3:3,FIO2:3 in the last 72 hours  No results found for this basename: INR:3 in the last 72 hours    Lab Results   Component Value Date/Time    Specimen Description: BLOOD 06/16/2012 10:12 PM    Specimen Description: BLOOD 05/24/2012 10:47 AM     Lab Results   Component Value Date/Time    Culture result: NO GROWTH 5 DAYS  06/16/2012 10:12 PM    Culture result: NO GROWTH 5 DAYS 05/24/2012 10:47 AM     Total time spent with patient: 33 minutes                  Care Plan discussed with: Patient    Discussed:  Care Plan    Prophylaxis:  Heparin    Disposition:  Home w/Family           ___________________________________________________    Attending Physician: Gaspar Bidding, MD

## 2012-07-06 ENCOUNTER — Inpatient Hospital Stay
Admit: 2012-07-06 | Discharge: 2012-07-10 | Disposition: A | Discharge status: Home / Self Care | Payer: MEDICARE | Attending: Internal Medicine | Admitting: Internal Medicine

## 2012-07-06 LAB — METABOLIC PANEL, COMPREHENSIVE
A-G Ratio: 0.5 — ABNORMAL LOW (ref 1.1–2.2)
ALT (SGPT): 20 U/L (ref 12–78)
AST (SGOT): 14 U/L — ABNORMAL LOW (ref 15–37)
Albumin: 3.1 g/dL — ABNORMAL LOW (ref 3.5–5.0)
Alk. phosphatase: 143 U/L — ABNORMAL HIGH (ref 50–136)
Anion gap: 10 mmol/L (ref 5–15)
BUN/Creatinine ratio: 5 — ABNORMAL LOW (ref 12–20)
BUN: 41 MG/DL — ABNORMAL HIGH (ref 6–20)
Bilirubin, total: 0.3 MG/DL (ref 0.2–1.0)
CO2: 28 MMOL/L (ref 21–32)
Calcium: 8.9 MG/DL (ref 8.5–10.1)
Chloride: 94 MMOL/L — ABNORMAL LOW (ref 97–108)
Creatinine: 7.52 MG/DL — ABNORMAL HIGH (ref 0.45–1.15)
GFR est AA: 10 mL/min/{1.73_m2} — ABNORMAL LOW (ref >60)
GFR est non-AA: 8 mL/min/{1.73_m2} — ABNORMAL LOW (ref >60)
Globulin: 6.7 g/dL — ABNORMAL HIGH (ref 2.0–4.0)
Glucose: 175 MG/DL — ABNORMAL HIGH (ref 65–100)
Potassium: 4.5 MMOL/L (ref 3.5–5.1)
Protein, total: 9.8 g/dL — ABNORMAL HIGH (ref 6.4–8.2)
Sodium: 132 MMOL/L — ABNORMAL LOW (ref 136–145)

## 2012-07-06 LAB — GLUCOSE, POC
Glucose (POC): 86 mg/dL (ref 75–110)
Glucose (POC): 102 mg/dL (ref 75–110)
Glucose (POC): 185 mg/dL — ABNORMAL HIGH (ref 75–110)

## 2012-07-06 LAB — CBC WITH AUTOMATED DIFF
ABS. BASOPHILS: 0 10*3/uL (ref 0.0–0.1)
ABS. EOSINOPHILS: 0.5 10*3/uL — ABNORMAL HIGH (ref 0.0–0.4)
ABS. LYMPHOCYTES: 1.1 10*3/uL (ref 0.8–3.5)
ABS. MONOCYTES: 0.4 10*3/uL (ref 0.0–1.0)
ABS. NEUTROPHILS: 2.1 10*3/uL (ref 1.8–8.0)
BASOPHILS: 0 % (ref 0–1)
EOSINOPHILS: 12 % — ABNORMAL HIGH (ref 0–7)
HCT: 29.7 % — ABNORMAL LOW (ref 36.6–50.3)
HGB: 9.4 g/dL — ABNORMAL LOW (ref 12.1–17.0)
LYMPHOCYTES: 27 % (ref 12–49)
MCH: 30.4 PG (ref 26.0–34.0)
MCHC: 31.6 g/dL (ref 30.0–36.5)
MCV: 96.1 FL (ref 80.0–99.0)
MONOCYTES: 10 % (ref 5–13)
NEUTROPHILS: 51 % (ref 32–75)
PLATELET: 181 10*3/uL (ref 150–400)
RBC: 3.09 M/uL — ABNORMAL LOW (ref 4.10–5.70)
RDW: 15.6 % — ABNORMAL HIGH (ref 11.5–14.5)
WBC: 4.2 10*3/uL (ref 4.1–11.1)

## 2012-07-06 LAB — RETICULOCYTE COUNT: Reticulocyte count: 1.9 % (ref 0.7–2.1)

## 2012-07-06 MED ADMIN — HYDROmorphone (PF) (DILAUDID) injection 1 mg: INTRAVENOUS | @ 11:00:00 | NDC 00409255201

## 2012-07-06 MED ADMIN — heparin (porcine) injection 5,000 Units: SUBCUTANEOUS | @ 14:00:00 | NDC 25021040201

## 2012-07-06 MED ADMIN — hydroxyurea (HYDREA) chemo cap 500 mg: ORAL | @ 15:00:00 | NDC 68084028411

## 2012-07-06 MED ADMIN — 0.9% sodium chloride infusion: INTRAVENOUS | @ 16:00:00 | NDC 00409798309

## 2012-07-06 MED ADMIN — B complex-vitaminC-folic acid (NEPHROCAP) cap: ORAL | @ 14:00:00 | NDC 68084006511

## 2012-07-06 MED ADMIN — ondansetron (ZOFRAN) injection 4 mg: INTRAVENOUS | @ 17:00:00 | NDC 00409475503

## 2012-07-06 MED ADMIN — HYDROmorphone (PF) (DILAUDID) injection 1 mg: INTRAVENOUS | @ 02:00:00 | NDC 00409255201

## 2012-07-06 MED ADMIN — sodium chloride 0.9 % bolus infusion 1,000 mL: INTRAVENOUS | @ 02:00:00 | NDC 00409798309

## 2012-07-06 MED ADMIN — HYDROmorphone (PF) (DILAUDID) injection 1 mg: INTRAVENOUS | @ 05:00:00 | NDC 00409255201

## 2012-07-06 MED ADMIN — heparin (porcine) injection 5,000 Units: SUBCUTANEOUS | @ 21:00:00 | NDC 25021040201

## 2012-07-06 MED ADMIN — 0.9% sodium chloride infusion: INTRAVENOUS | @ 05:00:00 | NDC 00409798309

## 2012-07-06 MED ADMIN — calcium acetate (PHOSLO) tablet 1,334 mg: ORAL | @ 21:00:00 | NDC 00574011302

## 2012-07-06 MED ADMIN — ondansetron (ZOFRAN) injection 4 mg: INTRAVENOUS | @ 13:00:00 | NDC 00409475503

## 2012-07-06 MED ADMIN — cloNIDine (CATAPRES) tablet 0.1 mg: ORAL | @ 19:00:00 | NDC 62584065711

## 2012-07-06 MED ADMIN — promethazine (PHENERGAN) injection 12.5 mg: INTRAVENOUS | @ 03:00:00 | NDC 60977000143

## 2012-07-06 MED ADMIN — amLODIPine (NORVASC) tablet 5 mg: ORAL | @ 14:00:00 | NDC 59762153006

## 2012-07-06 MED ADMIN — HYDROmorphone (PF) (DILAUDID) injection 2 mg: INTRAVENOUS | @ 21:00:00 | NDC 00409255201

## 2012-07-06 MED ADMIN — insulin glargine (LANTUS) injection 8 Units: SUBCUTANEOUS | @ 20:00:00 | NDC 09999222001

## 2012-07-06 MED ADMIN — 0.9% sodium chloride infusion: INTRAVENOUS | @ 13:00:00 | NDC 00409798309

## 2012-07-06 MED ADMIN — HYDROmorphone (PF) (DILAUDID) injection 2 mg: INTRAVENOUS | @ 03:00:00 | NDC 00409336501

## 2012-07-06 MED ADMIN — promethazine (PHENERGAN) 12.5 mg in 0.9% sodium chloride 50 mL IVPB: INTRAVENOUS | @ 03:00:00 | NDC 00641092821

## 2012-07-06 MED ADMIN — HYDROmorphone (PF) (DILAUDID) injection 1 mg: INTRAVENOUS | @ 03:00:00 | NDC 00409255201

## 2012-07-06 MED ADMIN — promethazine (PHENERGAN) injection 12.5 mg: INTRAVENOUS | @ 02:00:00 | NDC 60977000143

## 2012-07-06 MED ADMIN — prochlorperazine (COMPAZINE) with saline injection 5 mg: INTRAVENOUS | @ 02:00:00 | NDC 54868413700

## 2012-07-06 MED ADMIN — calcium acetate (PHOSLO) tablet 1,334 mg: ORAL | @ 18:00:00 | NDC 00574011302

## 2012-07-06 MED ADMIN — lisinopril (PRINIVIL, ZESTRIL) tablet 5 mg: ORAL | @ 14:00:00 | NDC 68084006011

## 2012-07-06 MED ADMIN — HYDROmorphone (PF) (DILAUDID) injection 2 mg: INTRAVENOUS | @ 17:00:00 | NDC 00409255201

## 2012-07-06 MED ADMIN — HYDROmorphone (PF) (DILAUDID) injection 2 mg: INTRAVENOUS | @ 14:00:00 | NDC 00409255201

## 2012-07-06 MED FILL — CALCIUM ACETATE 667 MG TAB: 667 mg | ORAL | Qty: 2

## 2012-07-06 MED FILL — SODIUM CHLORIDE 0.9 % IV: INTRAVENOUS | Qty: 1000

## 2012-07-06 MED FILL — LISINOPRIL 5 MG TAB: 5 mg | ORAL | Qty: 1

## 2012-07-06 MED FILL — HYDROMORPHONE (PF) 1 MG/ML IJ SOLN: 1 mg/mL | INTRAMUSCULAR | Qty: 1

## 2012-07-06 MED FILL — DILAUDID (PF) 2 MG/ML INJECTION SOLUTION: 2 mg/mL | INTRAMUSCULAR | Qty: 1

## 2012-07-06 MED FILL — INSULIN GLARGINE 100 UNIT/ML INJECTION: 100 unit/mL | SUBCUTANEOUS | Qty: 0.08

## 2012-07-06 MED FILL — HYDROMORPHONE (PF) 1 MG/ML IJ SOLN: 1 mg/mL | INTRAMUSCULAR | Qty: 2

## 2012-07-06 MED FILL — HEPARIN (PORCINE) 5,000 UNIT/ML IJ SOLN: 5000 unit/mL | INTRAMUSCULAR | Qty: 1

## 2012-07-06 MED FILL — ONDANSETRON (PF) 4 MG/2 ML INJECTION: 4 mg/2 mL | INTRAMUSCULAR | Qty: 2

## 2012-07-06 MED FILL — DOCUSATE SODIUM 100 MG CAP: 100 mg | ORAL | Qty: 1

## 2012-07-06 MED FILL — NEPHROCAPS 1 MG CAPSULE: 1 mg | ORAL | Qty: 1

## 2012-07-06 MED FILL — HYDROXYUREA 500 MG CAPSULE: 500 mg | ORAL | Qty: 1

## 2012-07-06 MED FILL — CLONIDINE 0.1 MG TAB: 0.1 mg | ORAL | Qty: 1

## 2012-07-06 MED FILL — PROMETHAZINE 25 MG/ML INJECTION: 25 mg/mL | INTRAMUSCULAR | Qty: 0.5

## 2012-07-06 MED FILL — PROCHLORPERAZINE EDISYLATE 5 MG/ML INJECTION: 5 mg/mL | INTRAMUSCULAR | Qty: 2

## 2012-07-06 MED FILL — AMLODIPINE 5 MG TAB: 5 mg | ORAL | Qty: 1

## 2012-07-06 NOTE — Progress Notes (Addendum)
Location: 6EONC - [address]  Attn.: Karsten Ro  DOB: December 13, 1981 / Age: 31  MR#: [id number] / Admit#: [id number]  Pt. First Name: Tyler Ballard  Pt. Last Name: [name] [facility]  [address]  [place], [place]  [address]                        Case Management - Progress Note  Initial Open Date: 07/06/2012  Case Manager: Ledora Bottcher    Initial Open Date:  Social Worker:  Expected Date of Discharge:  Transferred From: home  ECF Bed Held Until:  Bed Held By:  Power of Attorney:  POA/Guardian/Conservator Capacity:  Primary Caregiver: Phylliss Bob, relative, [phone]  Living Arrangements: Own Home  Source of Income:  Payee:  Psychosocial History:  Cultural/Religious/Language Issues:  Education Level:  ADLS/Current Living Arrangements Issues: self care  Past Providers:  Will patient perform self care at discharge? Y  Anticipated Discharge Disposition Goal: Return to admission address  Assessment/Plan:        07/06/2012 03:07P Case Manager Paschal Dopp SW was alerted by Dr. Ignacia Bayley, and Ledora Bottcher RN read Dr's note in [facility] that this  patient has been using two different names. He has been hospitalized at  [facility], [facility] and [facility] since June, 2013. He has been using the name Tyler Ballard as well as Tyler Ballard. Risk management, Benita Stabile, was notified of  this situation.  Ledora Bottcher RN ACM CRM        07/06/2012 11:10A Chart reviewed for medical necessity and d/c planning. No  d/c needs identified at this time. Will follow for medical necessity.  Ledora Bottcher RN ACM CRM  Resources at Discharge:  Service Providers at Discharge:  Dictating Provider:  Jim Like

## 2012-07-06 NOTE — Progress Notes (Signed)
Hospitalist Progress Note          Tyler Ballard   [phone]  Call physician on-call through the operator 7pm-7am    Daily Progress Note: 07/06/2012    Assessment/Plan:     1. Sickle Cell Pain Crises. Pain not controlled with 1 mg dilauded, increase to 2 mg q 3hrs, continue IVF, repeat CBC  2. ESRD. On HD MWF > Renal consutled.  3. HTN . On Lisinopril + amldipine. BP not at goal, likely 2/2 pain. Watch the trend and adjust the dose as necessary.  4. DM2 . On Lantus + Lispro. Accuchecks ordered.  5. DVT Prophylaxis. On sq heparin           Subjective:   C/o Generalzied body aches and pains. + Nausea. No abd pain     Review of Systems:   neg    Objective:   Physical Exam:     BP 161/77   Pulse 77   Temp 98.1 ??F (36.7 ??C)   Resp 18   Ht 5\' 5"  (1.651 m)   Wt 175 lb   BMI 29.12 kg/m2   SpO2 96%   O2 Device: Room air    Temp (24hrs), Avg:98.6 ??F (37 ??C), Min:98.1 ??F (36.7 ??C), Max:99.3 ??F (37.4 ??C)             General:  Alert, cooperative, no distress, appears stated age.   Lungs:   Clear to auscultation bilaterally.   Chest wall:  No tenderness or deformity.   Heart:  Regular rate and rhythm, S1, S2 normal, no murmur, click, rub or gallop.   Abdomen:   Soft, non-tender. Bowel sounds normal. No masses,  No organomegaly.   Extremities: Extremities normal, atraumatic, no cyanosis or edema.   Pulses: 2+ and symmetric all extremities.   Skin: Skin color, texture, turgor normal. No rashes or lesions   Neurologic: CNII-XII intact.      Data Review:       Recent Days:  Recent Labs   [facility] 07/05/12 2214    WBC 4.2    HGB 9.4*    HCT 29.7*    PLT 181     Recent Labs   Basename 07/05/12 2214    NA 132*    K 4.5    CL 94*    CO2 28    GLU 175*    BUN 41*    CREA 7.52*    CA 8.9    MG --    PHOS --    ALB 3.1*    TBIL 0.3    SGOT 14*    INR --     No results found for this basename: PH:3,PCO2:3,PO2:3,HCO3:3,FIO2:3 in the last 72 hours    24 Hour Results:  Recent Results (from the past 24 hour(s))    CBC WITH AUTOMATED DIFF    Collection Time    07/05/12 10:14 PM       Component Value Range    WBC 4.2  4.1 - 11.1 K/uL    RBC 3.09 (*) 4.10 - 5.70 M/uL    HGB 9.4 (*) 12.1 - 17.0 g/dL    HCT 09.8 (*) 11.9 - 50.3 %    MCV 96.1  80.0 - 99.0 FL    MCH 30.4  26.0 - 34.0 PG    MCHC 31.6  30.0 - 36.5 g/dL    RDW 14.7 (*) 82.9 - 14.5 %    PLATELET 181  150 - 400 K/uL  NEUTROPHILS 51  32 - 75 %    LYMPHOCYTES 27  12 - 49 %    MONOCYTES 10  5 - 13 %    EOSINOPHILS 12 (*) 0 - 7 %    BASOPHILS 0  0 - 1 %    ABS. NEUTROPHILS 2.1  1.8 - 8.0 K/UL    ABS. LYMPHOCYTES 1.1  0.8 - 3.5 K/UL    ABS. MONOCYTES 0.4  0.0 - 1.0 K/UL    ABS. EOSINOPHILS 0.5 (*) 0.0 - 0.4 K/UL    ABS. BASOPHILS 0.0  0.0 - 0.1 K/UL   METABOLIC PANEL, COMPREHENSIVE    Collection Time    07/05/12 10:14 PM       Component Value Range    Sodium 132 (*) 136 - 145 MMOL/L    Potassium 4.5  3.5 - 5.1 MMOL/L    Chloride 94 (*) 97 - 108 MMOL/L    CO2 28  21 - 32 MMOL/L    Anion gap 10  5 - 15 mmol/L    Glucose 175 (*) 65 - 100 MG/DL    BUN 41 (*) 6 - 20 MG/DL    Creatinine 9.60 (*) 0.45 - 1.15 MG/DL    BUN/Creatinine ratio 5 (*) 12 - 20      GFR est-AA 10 (*) >60 ml/min/1.64m2    GFR est non-AA 8 (*) >60 ml/min/1.28m2    Calcium 8.9  8.5 - 10.1 MG/DL    Bilirubin, total 0.3  0.2 - 1.0 MG/DL    ALT 20  12 - 78 U/L    AST 14 (*) 15 - 37 U/L    Alk. phosphatase 143 (*) 50 - 136 U/L    Protein, total 9.8 (*) 6.4 - 8.2 g/dL    Albumin 3.1 (*) 3.5 - 5.0 g/dL    Globulin 6.7 (*) 2.0 - 4.0 g/dL    A-G Ratio 0.5 (*) 1.1 - 2.2     RETICULOCYTE COUNT    Collection Time    07/05/12 10:14 PM       Component Value Range    Reticulocyte count 1.9  0.7 - 2.1 %   GLUCOSE, POC    Collection Time    Jul 12, 2012  6:54 AM       Component Value Range    POC GLUCOSE 86  75 - 110 mg/dL    Performed by Levonne Hubert         Problem List:  Problem List as of 07/12/2012  Never Reviewed      Codes Class Noted - Resolved    *Sickle cell anemia with pain (Chronic) 282.62  07/05/2012 - Present               Medications reviewed  Current Facility-Administered Medications   Medication Dose Route Frequency   ??? amLODIPine (NORVASC) tablet 5 mg  5 mg Oral DAILY   ??? B complex-vitaminC-folic acid (NEPHROCAP) cap  1 Cap Oral DAILY   ??? calcium acetate (PHOSLO) tablet 1,334 mg  2 Tab Oral TID WITH MEALS   ??? hydroxyurea (HYDREA) chemo cap 500 mg  500 mg Oral DAILY   ??? insulin glargine (LANTUS) injection 8 Units  8 Units SubCUTAneous ACB   ??? lisinopril (PRINIVIL, ZESTRIL) tablet 5 mg  5 mg Oral DAILY   ??? 0.9% sodium chloride infusion  100 mL/hr IntraVENous CONTINUOUS   ??? acetaminophen (TYLENOL) tablet 650 mg  650 mg Oral Q4H PRN   ??? acetaminophen (TYLENOL) tablet 650 mg  650 mg Oral Q4H PRN   ??? ondansetron (ZOFRAN) injection 4 mg  4 mg IntraVENous Q4H PRN   ??? magnesium hydroxide (MILK OF MAGNESIA) oral suspension 30 mL  30 mL Oral DAILY PRN   ??? docusate sodium (COLACE) capsule 100 mg  100 mg Oral BID   ??? bisacodyl (DULCOLAX) suppository 10 mg  10 mg Rectal DAILY PRN   ??? heparin (porcine) injection 5,000 Units  5,000 Units SubCUTAneous Q8H   ??? prochlorperazine (COMPAZINE) with saline injection 5 mg  5 mg IntraVENous Q6H PRN   ??? insulin lispro (HUMALOG) injection 2-3 Units  2-3 Units SubCUTAneous QHS   ??? HYDROmorphone (PF) (DILAUDID) injection 2 mg  2 mg IntraVENous Q3H PRN   ??? sodium chloride 0.9 % bolus infusion 1,000 mL  1,000 mL IntraVENous NOW   ??? HYDROmorphone (PF) (DILAUDID) injection 1 mg  1 mg IntraVENous NOW   ??? HYDROmorphone (PF) (DILAUDID) injection 1 mg  1 mg IntraVENous ONCE   ??? promethazine (PHENERGAN) 12.5 mg in 0.9% sodium chloride 50 mL IVPB  12.5 mg IntraVENous NOW   ??? HYDROmorphone (PF) (DILAUDID) injection 2 mg  2 mg IntraVENous ONCE       Tyler Sayres, MD

## 2012-07-06 NOTE — Other (Signed)
TRANSFER - OUT REPORT:    Verbal report given to Lauren on Tyler Ballard  being transferred to 6E for routine progression of care       Report consisted of patient???s Situation, Background, Assessment and   Recommendations(SBAR).     Information from the following report(s) SBAR was reviewed with the receiving nurse.    Opportunity for questions and clarification was provided.

## 2012-07-06 NOTE — H&P (Signed)
Name:       Tyler Ballard, Tyler Ballard                Admitted:    07/05/2012    Account #:  [id number]                     DOB:         [birth year]  Physician:  Karsten Ro, MD                 Age:         31                               HISTORY AND PHYSICAL      PRIMARY CARE PHYSICIAN: None.    CHIEF COMPLAINT: Generalized body pain.    HISTORY OF PRESENT ILLNESS: The patient is a 31 year old male with past  medical history of sickle cell disease, diabetes mellitus, and chronic  kidney disease, who presents today with chief complaint as above. The  patient apparently is visiting [place] from [place] for a family  reunion. He states he plans to return to [facility], in time  to make his routine dialysis, which is scheduled for Mondays, Wednesdays,  and Fridays. For his complaint of pain, the pain apparently started in his  lower back and extends to his lower extremities. He described this pain as  usual for his sickle cell crises. He denies any associated fever or chills,  has not had headaches or dizziness, but has had a cough from his history of  cigarette smoking. Denies any chest pain or shortness of breath.    PAST MEDICAL HISTORY  1. Sickle cell anemia.  2. Diabetes mellitus.  3. Hypertension.  4. Chronic kidney disease on hemodialysis.    ALLERGIES: HE HAS NO KNOWN DRUG ALLERGIES.    CURRENT MEDICATIONS  1. Norvasc 5 mg daily.  2. Vitamin B complex 1 tab daily.  3. PhosLo 667 mg 3 times daily.  4. Hydromorphone 4 mg every 3 hours as needed.  5. Hydroxyurea 500 mg daily.  6. Insulin Aspart 2-3 units daily.  7. Lantus 8 units every day before breakfast.  8. Lisinopril 5 mg daily.  9. Promethazine 25 mg every 4 hours as needed.    SOCIAL HISTORY: Smokes 1 pack of cigarettes per week. Does not drink  alcohol, works as a Paediatric nurse.    CODE STATUS: FULL CODE.    FAMILY HISTORY: Not known.    REVIEW OF SYSTEMS  As described above in HPI. In addition, all other systems are reviewed, no  additional  findings noted.    PHYSICAL EXAMINATION  VITAL SIGNS: Blood pressure is 142/71, heart rate 77, temperature 99.3,  respiratory rate 18, O2 saturation 98% on room air.  GENERAL: The patient is lying comfortably in bed in no apparent distress.  HEENT: Normocephalic, atraumatic, anicteric.  NECK: No JVD or lymphadenopathy.  CHEST: Clear bilaterally to auscultation.  CARDIOVASCULAR: S1, S2, regular rate and rhythm.  ABDOMEN: Nontender, nondistended, bowel sounds present.  CNS: Alert, oriented to person, place and time.  EXTREMITIES: No pedal edema. Pedal pulses present.    LABORATORY DATA: WBC 4.2, hemoglobin 9.4, hematocrit 29.7, platelets 181.  Sickle cell count 1.9.  SERUMS CHEMISTRY: Sodium 132, potassium 4.5, chloride 94, CO2 28, glucose  125, BUN 41, creatinine 7.52.    ASSESSMENT: This is a 31 year old with past history  as above, presents  today with generalized body pain and sickle cell disease.    IMPRESSION  1. Sickle cell pain crisis.  2. Chronic kidney disease stage IV.    PLAN  1. The patient admitted to regular floor. For his sickle cell pain crisis:  The patient describes the pain as 8 out of 10; however, his clinical appearance is not to the pain description. Pain control as needed, using Dilaudid IV for now, and gradually transition  back to his home p.o. regimen. He will be given IV fluids  with caution, given his dialysis dependence. At this time, the patient's  hemoglobin is stable and does not require blood transfusion. The  reticulocyte counts were high at this time.  2. For the chronic kidney disease. The patient had planned to return to  [facility] with dialysis tomorrow. I will therefore consult  nephrologist to see the patient while  in-house, and determine if hemodialysis to be done prior to his discharge.  He also notes plans for relocation to [facility] in the next couple of  weeks.  3. Home medication as above except, except for adjustments as above.    On behalf of [facility], I thank you for the  opportunity of taking care of this patient.        Reviewed on 07/06/2012 5:27 AM                Karsten Ro, MD    cc:                       Karsten Ro, MD      ICI/wmx; D: 07/05/2012 11:54 P; T: 07/06/2012 03:36 A; DOC# [id number]; Job#  [id number]

## 2012-07-06 NOTE — Other (Signed)
TRANSFER - IN REPORT:    Verbal report received from Karis, RN(name) on [employer]  being received from ED(unit) for routine progression of care      Report consisted of patient???s Situation, Background, Assessment and   Recommendations(SBAR).     Information from the following report(s) SBAR, Kardex, ED Summary, [facility] and Recent Results was reviewed with the receiving nurse.    Opportunity for questions and clarification was provided.      Assessment completed upon patient???s arrival to unit and care assumed.

## 2012-07-06 NOTE — Progress Notes (Signed)
Bedside shift change report given to feven Rn (oncoming nurse) by Elmarie Shiley RN (offgoing nurse).  Report given with SBAR.

## 2012-07-06 NOTE — Consults (Addendum)
[facility] consult Note        NAME: Tyler Ballard       DOB:  [birth year]       MRN:  [id number]     Date/Time: 07/06/2012    Risk of deterioration: low       Assessment:    Plan:  ESRD  Sickle cell crisis  HTN  AVF  Brawny edema Pt dialyzes on T/Th/S in [place] thru his right avf.  He has a failed right AVG. His dialysis time is 4 hours.  Plan on HD tomorrow, add epogen  Adjust po meds  Pull fluid, he has brawny edema       Subjective:     Chief Complaint:  None,     Review of Systems: pain under control, no n/v. He has been on HD 4.5 years    Objective:     VITALS:   Last 24hrs VS reviewed since prior progress note. Most recent are:  Visit Vitals   Item Reading   ??? BP 183/97   ??? Pulse 59   ??? Temp 97.2 ??F (36.2 ??C)   ??? Resp 14   ??? Ht 5\' 5"  (1.651 m)   ??? Wt 79.379 kg (175 lb)   ??? BMI 29.12 kg/m2   ??? SpO2 96%     SpO2 Readings from Last 6 Encounters:   07/06/12 96%        No intake or output data in the 24 hours ending 07/06/12 1156         PHYSICAL EXAM:    General   well developed, well nourished, appears stated age, in no acute distress  Respiratory   Clear anteriorly  Cardiology  RR,   Abdominal  Soft nt  Neurological  No focal neurological deficits noted  Psychological  Oriented x 3.  Normal affect.        Ext: failed left avg, right avf with t/b   Brawny, chronic bilateral lower ext edema     Lab Data Reviewed: (see below)    Medications Reviewed: (see below)    PMH/SH reviewed - no change compared to H&P  ________________________________________  ___________________________________________________    Attending Physician: Gary Fleet, MD     ____________________________________________________  MEDICATIONS:  Current Facility-Administered Medications   Medication Dose Route Frequency   ??? amLODIPine (NORVASC) tablet 5 mg  5 mg Oral DAILY   ??? B complex-vitaminC-folic acid (NEPHROCAP) cap  1 Cap Oral DAILY   ??? calcium acetate (PHOSLO) tablet 1,334 mg  2 Tab Oral TID WITH MEALS   ??? hydroxyurea (HYDREA) chemo cap  500 mg  500 mg Oral DAILY   ??? insulin glargine (LANTUS) injection 8 Units  8 Units SubCUTAneous ACB   ??? lisinopril (PRINIVIL, ZESTRIL) tablet 5 mg  5 mg Oral DAILY   ??? 0.9% sodium chloride infusion  100 mL/hr IntraVENous CONTINUOUS   ??? acetaminophen (TYLENOL) tablet 650 mg  650 mg Oral Q4H PRN   ??? acetaminophen (TYLENOL) tablet 650 mg  650 mg Oral Q4H PRN   ??? ondansetron (ZOFRAN) injection 4 mg  4 mg IntraVENous Q4H PRN   ??? magnesium hydroxide (MILK OF MAGNESIA) oral suspension 30 mL  30 mL Oral DAILY PRN   ??? docusate sodium (COLACE) capsule 100 mg  100 mg Oral BID   ??? bisacodyl (DULCOLAX) suppository 10 mg  10 mg Rectal DAILY PRN   ??? heparin (porcine) injection 5,000 Units  5,000 Units SubCUTAneous Q8H   ??? prochlorperazine (COMPAZINE) with  saline injection 5 mg  5 mg IntraVENous Q6H PRN   ??? insulin lispro (HUMALOG) injection 2-3 Units  2-3 Units SubCUTAneous QHS   ??? HYDROmorphone (PF) (DILAUDID) injection 2 mg  2 mg IntraVENous Q3H PRN   ??? sodium chloride 0.9 % bolus infusion 1,000 mL  1,000 mL IntraVENous NOW   ??? HYDROmorphone (PF) (DILAUDID) injection 1 mg  1 mg IntraVENous NOW   ??? HYDROmorphone (PF) (DILAUDID) injection 1 mg  1 mg IntraVENous ONCE   ??? promethazine (PHENERGAN) 12.5 mg in 0.9% sodium chloride 50 mL IVPB  12.5 mg IntraVENous NOW   ??? HYDROmorphone (PF) (DILAUDID) injection 2 mg  2 mg IntraVENous ONCE        LABS:  Recent Labs   [facility] 07/05/12 2214    WBC 4.2    HGB 9.4*    HCT 29.7*    PLT 181     Recent Labs   Basename 07/05/12 2214    NA 132*    K 4.5    CL 94*    CO2 28    BUN 41*    CREA 7.52*    GLU 175*    CA 8.9    MG --    PHOS --    URICA --    PTH --    XPTHCT --    PTHN --    XPTHNT --     Recent Labs   [facility] 07/05/12 2214    SGOT 14*    GPT 20    AP 143*    TBIL 0.3    TP 9.8*    ALB 3.1*    GLOB 6.7*    GGT --    AML --    LPSE --     No results found for this basename: INR:3,PTP:3,APTT:3, in the last 72 hours   No results found for this basename: FE:2,TIBC:2,PSAT:2,FERR:2, in the  last 72 hours   No results found for this basename: PH:2,PCO2:2,PO2:2, in the last 72 hours  No results found for this basename: CPK:3,CPKMB:3,CKNDX:3,TROIQ:3 in the last 72 hours  Lab Results   Component Value Date/Time    POC GLUCOSE 86 07/06/2012  6:54 AM

## 2012-07-06 NOTE — Progress Notes (Addendum)
Renal--pt also goes by the name Vilinda Boehringer (with [place] address). He has been going to a variety of hospitals--[facility], [facility] and [facility].  (last hospitalized [place] 7/11).  He was hospitalized here at [facility] in June.  WIll need to alert administration that he is using two different names.  He has been dialyzed by [facility] during these stays.    WHen he was hospitalized at [facility] he was dialyzed by [facility] (July 5th).  He was discharged from [facility] on 7/10 and then admitted at [facility] on 7/11. He was discharged from [facility] on or about 7/17, then admitted here 7/18th.  We will dialyze him for this hospitalization only, but will not assume long term care of this non compliant, esrd patient. I did not find out any of this information until I called in dialysis orders and [facility] remembered him.  I have passed this information along to CM so hopefully the charts can be combined.     Please review the hospital records listed under the name Vilinda Boehringer, same bd and ssn #. He has essentially been hospitalized since 05/24/16.    Gary Fleet, MD

## 2012-07-06 NOTE — Progress Notes (Signed)
Bedside shift change report given to Tiffany Wilson RN (oncoming nurse) by Lauren Easter RN (offgoing nurse).  Report given with SBAR, Kardex and MAR.

## 2012-07-07 LAB — GLUCOSE, POC
Glucose (POC): 154 mg/dL — ABNORMAL HIGH (ref 75–110)
Glucose (POC): 87 mg/dL (ref 75–110)
Glucose (POC): 99 mg/dL (ref 75–110)
Glucose (POC): 167 mg/dL — ABNORMAL HIGH (ref 75–110)

## 2012-07-07 LAB — MAGNESIUM: Magnesium: 2.3 MG/DL (ref 1.6–2.4)

## 2012-07-07 LAB — METABOLIC PANEL, BASIC
Anion gap: 10 mmol/L (ref 5–15)
BUN/Creatinine ratio: 6 — ABNORMAL LOW (ref 12–20)
BUN: 55 MG/DL — ABNORMAL HIGH (ref 6–20)
CO2: 22 MMOL/L (ref 21–32)
Calcium: 8.9 MG/DL (ref 8.5–10.1)
Chloride: 100 MMOL/L (ref 97–108)
Creatinine: 9.38 MG/DL — ABNORMAL HIGH (ref 0.45–1.15)
GFR est AA: 8 mL/min/{1.73_m2} — ABNORMAL LOW (ref >60)
GFR est non-AA: 7 mL/min/{1.73_m2} — ABNORMAL LOW (ref >60)
Glucose: 102 MG/DL — ABNORMAL HIGH (ref 65–100)
Potassium: 5.4 MMOL/L — ABNORMAL HIGH (ref 3.5–5.1)
Sodium: 132 MMOL/L — ABNORMAL LOW (ref 136–145)

## 2012-07-07 LAB — CBC WITH AUTOMATED DIFF
ABS. BASOPHILS: 0 10*3/uL (ref 0.0–0.1)
ABS. EOSINOPHILS: 0.5 10*3/uL — ABNORMAL HIGH (ref 0.0–0.4)
ABS. LYMPHOCYTES: 0.9 10*3/uL (ref 0.8–3.5)
ABS. MONOCYTES: 0.5 10*3/uL (ref 0.0–1.0)
ABS. NEUTROPHILS: 1.9 10*3/uL (ref 1.8–8.0)
BASOPHILS: 1 % (ref 0–1)
EOSINOPHILS: 13 % — ABNORMAL HIGH (ref 0–7)
HCT: 27.7 % — ABNORMAL LOW (ref 36.6–50.3)
HGB: 9 g/dL — ABNORMAL LOW (ref 12.1–17.0)
LYMPHOCYTES: 24 % (ref 12–49)
MCH: 30.3 PG (ref 26.0–34.0)
MCHC: 32.5 g/dL (ref 30.0–36.5)
MCV: 93.3 FL (ref 80.0–99.0)
MONOCYTES: 13 % (ref 5–13)
NEUTROPHILS: 49 % (ref 32–75)
PLATELET: 190 10*3/uL (ref 150–400)
RBC: 2.97 M/uL — ABNORMAL LOW (ref 4.10–5.70)
RDW: 15.4 % — ABNORMAL HIGH (ref 11.5–14.5)
WBC: 3.8 10*3/uL — ABNORMAL LOW (ref 4.1–11.1)

## 2012-07-07 LAB — LD: LD: 323 U/L — ABNORMAL HIGH (ref 85–241)

## 2012-07-07 LAB — HAPTOGLOBIN: Haptoglobin: 127 mg/dL (ref 30–200)

## 2012-07-07 LAB — PHOSPHORUS: Phosphorus: 5.2 MG/DL — ABNORMAL HIGH (ref 2.5–4.9)

## 2012-07-07 MED ADMIN — calcium acetate (PHOSLO) tablet 1,334 mg: ORAL | @ 23:00:00 | NDC 00574011302

## 2012-07-07 MED ADMIN — HYDROmorphone (PF) (DILAUDID) injection 2 mg: INTRAVENOUS | @ 19:00:00 | NDC 00409255201

## 2012-07-07 MED ADMIN — amLODIPine (NORVASC) tablet 10 mg: ORAL | @ 19:00:00 | NDC 59762153006

## 2012-07-07 MED ADMIN — HYDROmorphone (PF) (DILAUDID) injection 2 mg: INTRAVENOUS | @ 22:00:00 | NDC 00409255201

## 2012-07-07 MED ADMIN — sodium chloride (NS) flush 5-10 mL: INTRAVENOUS | @ 16:00:00 | NDC 87701099893

## 2012-07-07 MED ADMIN — HYDROmorphone (PF) (DILAUDID) injection 2 mg: INTRAVENOUS | @ 13:00:00 | NDC 00409255201

## 2012-07-07 MED ADMIN — heparin (porcine) injection 5,000 Units: SUBCUTANEOUS | @ 04:00:00 | NDC 25021040201

## 2012-07-07 MED ADMIN — cloNIDine (CATAPRES) tablet 0.1 mg: ORAL | @ 02:00:00 | NDC 62584065711

## 2012-07-07 MED ADMIN — sodium chloride (NS) 0.9 % flush: @ 08:00:00 | NDC 87701099893

## 2012-07-07 MED ADMIN — hydroxyurea (HYDREA) chemo cap 500 mg: ORAL | @ 20:00:00 | NDC 68084028411

## 2012-07-07 MED ADMIN — lisinopril (PRINIVIL, ZESTRIL) tablet 20 mg: ORAL | @ 19:00:00 | NDC 68084006211

## 2012-07-07 MED ADMIN — B complex-vitaminC-folic acid (NEPHROCAP) cap: ORAL | @ 19:00:00 | NDC 68084006511

## 2012-07-07 MED ADMIN — HYDROmorphone (PF) (DILAUDID) injection 2 mg: INTRAVENOUS | @ 01:00:00 | NDC 00409255201

## 2012-07-07 MED ADMIN — HYDROmorphone (PF) (DILAUDID) injection 2 mg: INTRAVENOUS | @ 10:00:00 | NDC 00409255201

## 2012-07-07 MED ADMIN — sodium chloride (NS) flush 5-10 mL: INTRAVENOUS | @ 19:00:00 | NDC 87701099893

## 2012-07-07 MED ADMIN — insulin lispro (HUMALOG) injection 2-3 Units: SUBCUTANEOUS | @ 02:00:00 | NDC 00002751017

## 2012-07-07 MED ADMIN — HYDROmorphone (PF) (DILAUDID) injection 2 mg: INTRAVENOUS | @ 08:00:00 | NDC 00409255201

## 2012-07-07 MED ADMIN — HYDROmorphone (PF) (DILAUDID) injection 2 mg: INTRAVENOUS | @ 16:00:00 | NDC 00409255201

## 2012-07-07 MED ADMIN — HYDROmorphone (PF) (DILAUDID) injection 2 mg: INTRAVENOUS | @ 04:00:00 | NDC 00409255201

## 2012-07-07 MED FILL — HYDROMORPHONE (PF) 1 MG/ML IJ SOLN: 1 mg/mL | INTRAMUSCULAR | Qty: 2

## 2012-07-07 MED FILL — BD POSIFLUSH NORMAL SALINE 0.9 % INJECTION SYRINGE: INTRAMUSCULAR | Qty: 20

## 2012-07-07 MED FILL — BD POSIFLUSH NORMAL SALINE 0.9 % INJECTION SYRINGE: INTRAMUSCULAR | Qty: 10

## 2012-07-07 MED FILL — LISINOPRIL 20 MG TAB: 20 mg | ORAL | Qty: 1

## 2012-07-07 MED FILL — CALCIUM ACETATE 667 MG TAB: 667 mg | ORAL | Qty: 2

## 2012-07-07 MED FILL — AMLODIPINE 5 MG TAB: 5 mg | ORAL | Qty: 2

## 2012-07-07 MED FILL — INSULIN LISPRO 100 UNIT/ML INJECTION: 100 unit/mL | SUBCUTANEOUS | Qty: 3

## 2012-07-07 MED FILL — INSULIN GLARGINE 100 UNIT/ML INJECTION: 100 unit/mL | SUBCUTANEOUS | Qty: 0.08

## 2012-07-07 MED FILL — NEPHROCAPS 1 MG CAPSULE: 1 mg | ORAL | Qty: 1

## 2012-07-07 MED FILL — HYDROXYUREA 500 MG CAPSULE: 500 mg | ORAL | Qty: 1

## 2012-07-07 MED FILL — BD POSIFLUSH NORMAL SALINE 0.9 % INJECTION SYRINGE: INTRAMUSCULAR | Qty: 40

## 2012-07-07 MED FILL — HEPARIN (PORCINE) 5,000 UNIT/ML IJ SOLN: 5000 unit/mL | INTRAMUSCULAR | Qty: 1

## 2012-07-07 NOTE — Progress Notes (Signed)
Hospitalist Progress Note          Tyler Ballard   [phone]  Call physician on-call through the operator 7pm-7am    Daily Progress Note: 07/07/2012    Assessment/Plan:     1. Sickle Cell Pain Crises. Pain not controlled with 2 mg dilauded,Yesterday increased to 2 mg q 3hrs, still c/o generalized pain. No laboratory evidence to support sickling crisis. Pt wants more dilaudid. In view of history of using 2 different names going from one hospital to another, will check more labs before increasing meds.   2. ESRD. On HD T/Th/Sat- on HD now  C/o vomiting- no witness. Con zofran for nausea.  3. HTN . On Lisinopril + amldipine. BP not at goal. Change catapres to ATCj.  4. DM2 . -cont ss, lantus  5. DVT Prophylaxis. On sq heparin           Subjective:   C/o Generalzied body aches and pains. + Nausea    Review of Systems:   neg    Objective:   Physical Exam:     BP 184/90   Pulse 66   Temp 96.6 ??F (35.9 ??C)   Resp 16   Ht 5\' 5"  (1.651 m)   Wt 175 lb   BMI 29.12 kg/m2   SpO2 98%   O2 Device: Room air    Temp (24hrs), Avg:97.2 ??F (36.2 ??C), Min:96.6 ??F (35.9 ??C), Max:97.7 ??F (36.5 ??C)        07/18 1900 - 07/20 0659  In: 720 [P.O.:720]  Out: 0     General:  Alert, cooperative, no distress, appears stated age.   Lungs:   Clear to auscultation bilaterally.   Chest wall:  No tenderness or deformity.   Heart:  Regular rate and rhythm, S1, S2 normal, no murmur, click, rub or gallop.   Abdomen:   Soft, non-tender. Bowel sounds normal. No masses,  No organomegaly.   Extremities: Extremities normal, atraumatic, no cyanosis or edema.   Pulses: 2+ and symmetric all extremities.   Skin: Skin color, texture, turgor normal. No rashes or lesions   Neurologic: CNII-XII intact.      Data Review:       Recent Days:  Recent Labs   [facility] 07/07/12 0959 07/05/12 2214    WBC 3.8* 4.2    HGB 9.0* 9.4*    HCT 27.7* 29.7*    PLT 190 181     Recent Labs   Basename 07/07/12 0959 07/05/12 2214    NA 132* 132*    K 5.4*  4.5    CL 100 94*    CO2 22 28    GLU 102* 175*    BUN 55* 41*    CREA 9.38* 7.52*    CA 8.9 8.9    MG 2.3 --    PHOS 5.2* --    ALB -- 3.1*    TBIL -- 0.3    SGOT -- 14*    INR -- --     No results found for this basename: PH:3,PCO2:3,PO2:3,HCO3:3,FIO2:3 in the last 72 hours    24 Hour Results:  Recent Results (from the past 24 hour(s))   GLUCOSE, POC    Collection Time    07/06/12 12:23 PM       Component Value Range    POC GLUCOSE 102  75 - 110 mg/dL    Performed by Kristin Bruins     GLUCOSE, POC    Collection Time    07/06/12  5:17 PM       Component Value Range    POC GLUCOSE 185 (*) 75 - 110 mg/dL    Performed by Salli Real     GLUCOSE, POC    Collection Time    07/07/2012  9:36 PM       Component Value Range    POC GLUCOSE 154 (*) 75 - 110 mg/dL    Performed by Kristin Bruins     GLUCOSE, POC    Collection Time    07/07/12  6:25 AM       Component Value Range    POC GLUCOSE 87  75 - 110 mg/dL    Performed by [facility]     METABOLIC PANEL, BASIC    Collection Time    07/07/12  9:59 AM       Component Value Range    Sodium 132 (*) 136 - 145 MMOL/L    Potassium 5.4 (*) 3.5 - 5.1 MMOL/L    Chloride 100  97 - 108 MMOL/L    CO2 22  21 - 32 MMOL/L    Anion gap 10  5 - 15 mmol/L    Glucose 102 (*) 65 - 100 MG/DL    BUN 55 (*) 6 - 20 MG/DL    Creatinine 1.61 (*) 0.45 - 1.15 MG/DL    BUN/Creatinine ratio 6 (*) 12 - 20      GFR est-AA 8 (*) >60 ml/min/1.39m2    GFR est non-AA 7 (*) >60 ml/min/1.70m2    Calcium 8.9  8.5 - 10.1 MG/DL   CBC WITH AUTOMATED DIFF    Collection Time    07/07/12  9:59 AM       Component Value Range    WBC 3.8 (*) 4.1 - 11.1 K/uL    RBC 2.97 (*) 4.10 - 5.70 M/uL    HGB 9.0 (*) 12.1 - 17.0 g/dL    HCT 09.6 (*) 04.5 - 50.3 %    MCV 93.3  80.0 - 99.0 FL    MCH 30.3  26.0 - 34.0 PG    MCHC 32.5  30.0 - 36.5 g/dL    RDW 40.9 (*) 81.1 - 14.5 %    PLATELET 190  150 - 400 K/uL    NEUTROPHILS 49  32 - 75 %    LYMPHOCYTES 24  12 - 49 %    MONOCYTES 13  5 - 13 %    EOSINOPHILS 13 (*) 0 - 7 %     BASOPHILS 1  0 - 1 %    ABS. NEUTROPHILS 1.9  1.8 - 8.0 K/UL    ABS. LYMPHOCYTES 0.9  0.8 - 3.5 K/UL    ABS. MONOCYTES 0.5  0.0 - 1.0 K/UL    ABS. EOSINOPHILS 0.5 (*) 0.0 - 0.4 K/UL    ABS. BASOPHILS 0.0  0.0 - 0.1 K/UL   MAGNESIUM    Collection Time    07/07/12  9:59 AM       Component Value Range    Magnesium 2.3  1.6 - 2.4 MG/DL   PHOSPHORUS    Collection Time    07/07/12  9:59 AM       Component Value Range    Phosphorus 5.2 (*) 2.5 - 4.9 MG/DL       Problem List:  Problem List as of 07/07/2012  Date Reviewed: 07/07/12      Codes Class Noted - Resolved    ESRD (end stage renal disease) on dialysis (Chronic)  585.6  07/06/2012 - Present        HTN (hypertension) (Chronic) 401.9  07/06/2012 - Present        *Sickle cell anemia with pain (Chronic) 282.62  07/05/2012 - Present              Medications reviewed  Current Facility-Administered Medications   Medication Dose Route Frequency   ??? sodium chloride (NS) 0.9 % flush       ??? sodium chloride (NS) flush 5-10 mL  5-10 mL IntraVENous PRN   ??? B complex-vitaminC-folic acid (NEPHROCAP) cap  1 Cap Oral DAILY   ??? calcium acetate (PHOSLO) tablet 1,334 mg  2 Tab Oral TID WITH MEALS   ??? hydroxyurea (HYDREA) chemo cap 500 mg  500 mg Oral DAILY   ??? insulin glargine (LANTUS) injection 8 Units  8 Units SubCUTAneous ACB   ??? 0.9% sodium chloride infusion  100 mL/hr IntraVENous CONTINUOUS   ??? acetaminophen (TYLENOL) tablet 650 mg  650 mg Oral Q4H PRN   ??? acetaminophen (TYLENOL) tablet 650 mg  650 mg Oral Q4H PRN   ??? ondansetron (ZOFRAN) injection 4 mg  4 mg IntraVENous Q4H PRN   ??? magnesium hydroxide (MILK OF MAGNESIA) oral suspension 30 mL  30 mL Oral DAILY PRN   ??? docusate sodium (COLACE) capsule 100 mg  100 mg Oral BID   ??? bisacodyl (DULCOLAX) suppository 10 mg  10 mg Rectal DAILY PRN   ??? heparin (porcine) injection 5,000 Units  5,000 Units SubCUTAneous Q8H   ??? prochlorperazine (COMPAZINE) with saline injection 5 mg  5 mg IntraVENous Q6H PRN   ??? insulin lispro (HUMALOG) injection  2-3 Units  2-3 Units SubCUTAneous QHS   ??? HYDROmorphone (PF) (DILAUDID) injection 2 mg  2 mg IntraVENous Q3H PRN   ??? amLODIPine (NORVASC) tablet 10 mg  10 mg Oral DAILY   ??? lisinopril (PRINIVIL, ZESTRIL) tablet 20 mg  20 mg Oral DAILY   ??? cloNIDine (CATAPRES) tablet 0.1 mg  0.1 mg Oral Q4H PRN   ??? epoetin alfa (EPOGEN;PROCRIT) injection 10,000 Units  10,000 Units SubCUTAneous 2 Days a week   ??? albumin human 25% (BUMINATE) solution 25 g  25 g IntraVENous PRN       Vevelyn Royals, DO

## 2012-07-07 NOTE — Progress Notes (Addendum)
Paged MD on team 5 regarding the patient latest lab results LD and Haptoglobin. Awaiting a callback at this time.    1641; returned call and stated she would look at the results.

## 2012-07-07 NOTE — Progress Notes (Signed)
Symptoms / ROS    Pain not controlled - he wants more       Hospital problems  Sickle cell disease Pain control and dialysis continuing; don't have anything else to offer at present.     Renal failure    Severe pain    Diabetes     He say's Dr. Crista Curb is his hematologist. Dr. Crista Curb did see him when he was under the name or Vilinda Boehringer. He's never been to our office.        Exam    Normal NL ABNL   Constitutional   Chronically ill appearing   HEENT      Nodes      Line sites No inflammation    Lungs  Clear to auscultation    CV   RRR    Abdomen  Non-tender    Extremities  No edema Dialysis access OK   Joints  No inflammation    Skin  No rashes; good turgor    Neuro  Awake and alert    Other       Current medications, vital signs, and labs reviewed.    Time-based care: []  25-35 minutes     []  > 35 minutes  (Total time with >50% spent counseling/coordination)

## 2012-07-07 NOTE — Progress Notes (Addendum)
Patient is stating he is in a lot of pain and is requesting I page the MD on team 5.  He wants the MD to increase his dose of pain medication.  Awaiting a callback at this time.      1730; Dr. Nedra Hai on team 5 returned call and ordered Percocet for breakthrough pain in between the dilaudid dose.

## 2012-07-07 NOTE — Procedures (Signed)
Procedures  by Leontine Locket, RN at 07/07/12 1323                Author: Leontine Locket, RN  Service: --  Author Type: Registered Nurse       Filed: 07/07/12 1418  Date of Service: 07/07/12 1323  Status: Signed          Editor: Leontine Locket, RN (Registered Nurse)                                   [facility]                         [phone]          Vitals  Pre  Post  Assessment  Pre  Post            BP  152/83  165/88  LOC  AxOx3  same            HR  64  67  Lungs  clear  same            Temp  96.6  96.5  Cardiac  wnl  same     Resp  16  16  Skin  Warm,dry  same     Weight  79.4 kg  - 4 kg  Edema  LL pitting  same                  Pain  General on dilaudid  same          Orders             Duration:  Start:  1005  End:  1405  Total:  4 hrs        Dialyzer:  revaclear     K Bath:  3      Ca Bath:  2.5     Na / Bicarb:  140/35        Target Fluid Removal:  4 kg          Access        Type & Location:  Rt UAF       Comments:             Pre tx positive thrill and bruit; 2 15 g cannulated without any problems; Post tx, NS flush                          Labs        Hep B status / date:  Ab 62 on 01/24/12        Obtained/Reviewed   Critical Results Called  reviewed   n/a          Meds Given         Name  Dose  Route         none                                         Total Liters Process:  88.9        Net Fluid Removed:  5 kg           Comments        Time Out Done:  0930     Primary Nurse  Rpt Pre:  Laqueta Carina     Primary Nurse Rpt Post:  Laqueta Carina        Pt Education:  Fluid restriction        Tx Summary:  Pt tolerated tx well except for bouts of hypertension. Left pt in bed in no distress.

## 2012-07-07 NOTE — Procedures (Signed)
[place] [facility]                         [phone]  Vitals Pre Post Assessment Pre Post   BP 152/83 165/88 LOC AxOx3 same   HR 64 67 Lungs clear same   Temp 96.6 96.5 Cardiac wnl same   Resp 16 16 Skin Warm,dry same   Weight 79.4 kg - 4 kg Edema LL pitting same      Pain General on dilaudid same     Orders   Duration: Start: 1005 End: 1405 Total: 4 hrs   Dialyzer: revaclear   K Bath: 3    Ca Bath: 2.5   Na / Bicarb: 140/35   Target Fluid Removal: 4 kg     Access   Type & Location: Rt UAF   Comments:            Pre tx positive thrill and bruit; 2 15 g cannulated without any problems; Post tx, NS flush                     Labs   Hep B status / date: Ab 62 on 01/24/12   Obtained/Reviewed  Critical Results Called reviewed  n/a     Meds Given   Name Dose Route   none                 Total Liters Process: 88.9   Net Fluid Removed: 5 kg      Comments   Time Out Done: 0930   Primary Nurse Rpt Pre: Laqueta Carina   Primary Nurse Rpt Post: Laqueta Carina   Pt Education: Fluid restriction   Tx Summary: Pt tolerated tx well except for bouts of hypertension. Left pt in bed in no distress.

## 2012-07-07 NOTE — Progress Notes (Signed)
NAME: Tyler Ballard        DOB:  Nov 29, 1981        MRN:  [id number]        Assessment :    Plan:  --ESRD  Anemia  Sickle cell crisis   HTN   AVF   Brawny edema    --Plan for HD today.  Continue EPO  Pain control       Subjective:     Chief Complaint:  " I feel OK."  Says he still has "sickle pain."  Denies any N/V.  No dyspnea.      Review of Systems:    Symptom Y/N Comments  Symptom Y/N Comments   Fever/Chills    Chest Pain     Poor Appetite    Edema     Cough    Abdominal Pain     Sputum    Joint Pain     SOB/DOE    Pruritis/Rash     Nausea/vomit    Tolerating PT/OT     Diarrhea    Tolerating Diet     Constipation    Other       Could not obtain due to:      Objective:     VITALS:   Last 24hrs VS reviewed since prior progress note. Most recent are:  Visit Vitals   Item Reading   ??? BP 150/75   ??? Pulse 61   ??? Temp 96.8 ??F (36 ??C)   ??? Resp 16   ??? Ht 5\' 5"  (1.651 m)   ??? Wt 79.379 kg (175 lb)   ??? BMI 29.12 kg/m2   ??? SpO2 100%       Intake/Output Summary (Last 24 hours) at 07/07/12 0839  Last data filed at 07/07/12 8295   Gross per 24 hour   Intake    720 ml   Output      0 ml   Net    720 ml      Telemetry Reviewed:     PHYSICAL EXAM:  General:  no acute distress  2+ chronic edema      Lab Data Reviewed: (see below)    Medications Reviewed: (see below)    PMH/SH reviewed - no change compared to H&P  ________________________________________________________________________  Care Plan discussed with:  Patient     Family      RN     Care Manager                    Consultant:          Comments   >50% of visit spent in counseling and coordination of care       ________________________________________________________________________  Ashley Royalty, MD     Procedures: see electronic medical records for all procedures/Xrays and details which  were not copied into this note but were reviewed prior to creation of Plan.      LABS:  Recent  Labs   [facility] 07/05/12 2214    WBC 4.2    HGB 9.4*    HCT 29.7*    PLT 181     Recent Labs   Basename 07/05/12 2214    NA 132*    K 4.5    CL 94*    CO2 28    BUN 41*    CREA 7.52*    GLU 175*    CA 8.9    MG --    PHOS --    URICA --  Recent Labs   [facility] 07/05/12 2214    SGOT 14*    GPT 20    AP 143*    TBIL 0.3    TP 9.8*    ALB 3.1*    GLOB 6.7*    GGT --    AML --    LPSE --     No results found for this basename: INR:3,PTP:3,APTT:3, in the last 72 hours   No results found for this basename: FE:2,TIBC:2,PSAT:2,FERR:2, in the last 72 hours   No results found for this basename: B12, FOL, FOLAT, RBCF      No results found for this basename: PH:2,PCO2:2,PO2:2, in the last 72 hours  No results found for this basename: CPK:3,CKMB:3,TROPONINI:3, in the last 72 hours  Lab Results   Component Value Date/Time    POC GLUCOSE 87 07/07/2012  6:25 AM    POC GLUCOSE 154 07/06/2012  9:36 PM    POC GLUCOSE 185 07/06/2012  5:17 PM    POC GLUCOSE 102 07/06/2012 12:23 PM    POC GLUCOSE 86 07/06/2012  6:54 AM     No results found for this basename: color, apprn, spgru, refsg, phu, protu, glucu, ketu, bilu, urou, nitu, leuku, gluke, epsu, bactu, wbcu, rbcu, casts, ucry       MEDICATIONS:  Current Facility-Administered Medications   Medication Dose Route Frequency   ??? sodium chloride (NS) 0.9 % flush       ??? sodium chloride (NS) flush 5-10 mL  5-10 mL IntraVENous PRN   ??? B complex-vitaminC-folic acid (NEPHROCAP) cap  1 Cap Oral DAILY   ??? calcium acetate (PHOSLO) tablet 1,334 mg  2 Tab Oral TID WITH MEALS   ??? hydroxyurea (HYDREA) chemo cap 500 mg  500 mg Oral DAILY   ??? insulin glargine (LANTUS) injection 8 Units  8 Units SubCUTAneous ACB   ??? 0.9% sodium chloride infusion  100 mL/hr IntraVENous CONTINUOUS   ??? acetaminophen (TYLENOL) tablet 650 mg  650 mg Oral Q4H PRN   ??? acetaminophen (TYLENOL) tablet 650 mg  650 mg Oral Q4H PRN   ??? ondansetron (ZOFRAN) injection 4 mg  4 mg IntraVENous Q4H PRN   ??? magnesium hydroxide (MILK OF  MAGNESIA) oral suspension 30 mL  30 mL Oral DAILY PRN   ??? docusate sodium (COLACE) capsule 100 mg  100 mg Oral BID   ??? bisacodyl (DULCOLAX) suppository 10 mg  10 mg Rectal DAILY PRN   ??? heparin (porcine) injection 5,000 Units  5,000 Units SubCUTAneous Q8H   ??? prochlorperazine (COMPAZINE) with saline injection 5 mg  5 mg IntraVENous Q6H PRN   ??? insulin lispro (HUMALOG) injection 2-3 Units  2-3 Units SubCUTAneous QHS   ??? HYDROmorphone (PF) (DILAUDID) injection 2 mg  2 mg IntraVENous Q3H PRN   ??? amLODIPine (NORVASC) tablet 10 mg  10 mg Oral DAILY   ??? lisinopril (PRINIVIL, ZESTRIL) tablet 20 mg  20 mg Oral DAILY   ??? cloNIDine (CATAPRES) tablet 0.1 mg  0.1 mg Oral Q4H PRN   ??? epoetin alfa (EPOGEN;PROCRIT) injection 10,000 Units  10,000 Units SubCUTAneous 2 Days a week   ??? albumin human 25% (BUMINATE) solution 25 g  25 g IntraVENous PRN

## 2012-07-08 LAB — METABOLIC PANEL, BASIC
Anion gap: 7 mmol/L (ref 5–15)
BUN/Creatinine ratio: 5 — ABNORMAL LOW (ref 12–20)
BUN: 30 MG/DL — ABNORMAL HIGH (ref 6–20)
CO2: 31 MMOL/L (ref 21–32)
Calcium: 8.9 MG/DL (ref 8.5–10.1)
Chloride: 95 MMOL/L — ABNORMAL LOW (ref 97–108)
Creatinine: 6.08 MG/DL — ABNORMAL HIGH (ref 0.45–1.15)
GFR est AA: 13 mL/min/{1.73_m2} — ABNORMAL LOW (ref >60)
GFR est non-AA: 11 mL/min/{1.73_m2} — ABNORMAL LOW (ref >60)
Glucose: 92 MG/DL (ref 65–100)
Potassium: 5 MMOL/L (ref 3.5–5.1)
Sodium: 133 MMOL/L — ABNORMAL LOW (ref 136–145)

## 2012-07-08 LAB — GLUCOSE, POC
Glucose (POC): 112 mg/dL — ABNORMAL HIGH (ref 75–110)
Glucose (POC): 132 mg/dL — ABNORMAL HIGH (ref 75–110)
Glucose (POC): 134 mg/dL — ABNORMAL HIGH (ref 75–110)
Glucose (POC): 155 mg/dL — ABNORMAL HIGH (ref 75–110)
Glucose (POC): 187 mg/dL — ABNORMAL HIGH (ref 75–110)

## 2012-07-08 LAB — CBC WITH AUTOMATED DIFF
ABS. BASOPHILS: 0.1 10*3/uL (ref 0.0–0.1)
ABS. EOSINOPHILS: 0.4 10*3/uL (ref 0.0–0.4)
ABS. LYMPHOCYTES: 0.8 10*3/uL (ref 0.8–3.5)
ABS. MONOCYTES: 0.4 10*3/uL (ref 0.0–1.0)
ABS. NEUTROPHILS: 1.7 10*3/uL — ABNORMAL LOW (ref 1.8–8.0)
BASOPHILS: 2 % — ABNORMAL HIGH (ref 0–1)
EOSINOPHILS: 13 % — ABNORMAL HIGH (ref 0–7)
HCT: 30.3 % — ABNORMAL LOW (ref 36.6–50.3)
HGB: 9.7 g/dL — ABNORMAL LOW (ref 12.1–17.0)
LYMPHOCYTES: 24 % (ref 12–49)
MCH: 30.8 PG (ref 26.0–34.0)
MCHC: 32 g/dL (ref 30.0–36.5)
MCV: 96.2 FL (ref 80.0–99.0)
MONOCYTES: 11 % (ref 5–13)
NEUTROPHILS: 50 % (ref 32–75)
PLATELET: 183 10*3/uL (ref 150–400)
RBC: 3.15 M/uL — ABNORMAL LOW (ref 4.10–5.70)
RDW: 15.7 % — ABNORMAL HIGH (ref 11.5–14.5)
WBC: 3.4 10*3/uL — ABNORMAL LOW (ref 4.1–11.1)

## 2012-07-08 MED ADMIN — calcium acetate (PHOSLO) tablet 1,334 mg: ORAL | @ 21:00:00 | NDC 00574011302

## 2012-07-08 MED ADMIN — HYDROmorphone (PF) (DILAUDID) injection 2 mg: INTRAVENOUS | @ 13:00:00 | NDC 00409255201

## 2012-07-08 MED ADMIN — lisinopril (PRINIVIL, ZESTRIL) tablet 20 mg: ORAL | @ 14:00:00 | NDC 68084006211

## 2012-07-08 MED ADMIN — calcium acetate (PHOSLO) tablet 1,334 mg: ORAL | @ 14:00:00 | NDC 00574011302

## 2012-07-08 MED ADMIN — insulin glargine (LANTUS) injection 8 Units: SUBCUTANEOUS | @ 12:00:00 | NDC 32849075501

## 2012-07-08 MED ADMIN — insulin glargine (LANTUS) injection 8 Units: SUBCUTANEOUS | @ 10:00:00 | NDC 09999222001

## 2012-07-08 MED ADMIN — heparin (porcine) injection 5,000 Units: SUBCUTANEOUS | @ 14:00:00 | NDC 25021040201

## 2012-07-08 MED ADMIN — HYDROmorphone (PF) (DILAUDID) injection 2 mg: INTRAVENOUS | @ 01:00:00 | NDC 00409255201

## 2012-07-08 MED ADMIN — insulin lispro (HUMALOG) injection 2-3 Units: SUBCUTANEOUS | @ 01:00:00 | NDC 00002751017

## 2012-07-08 MED ADMIN — heparin (porcine) injection 5,000 Units: SUBCUTANEOUS | @ 21:00:00 | NDC 25021040201

## 2012-07-08 MED ADMIN — HYDROmorphone (PF) (DILAUDID) injection 2 mg: INTRAVENOUS | @ 04:00:00 | NDC 00409255201

## 2012-07-08 MED ADMIN — ondansetron (ZOFRAN) injection 4 mg: INTRAVENOUS | @ 14:00:00 | NDC 00409475503

## 2012-07-08 MED ADMIN — HYDROmorphone (PF) (DILAUDID) injection 2 mg: INTRAVENOUS | @ 10:00:00 | NDC 00409255201

## 2012-07-08 MED ADMIN — heparin (porcine) injection 5,000 Units: SUBCUTANEOUS | @ 04:00:00 | NDC 25021040201

## 2012-07-08 MED ADMIN — HYDROmorphone (PF) (DILAUDID) injection 2 mg: INTRAVENOUS | @ 07:00:00 | NDC 00409255201

## 2012-07-08 MED ADMIN — sodium chloride (NS) flush 5-10 mL: INTRAVENOUS | @ 21:00:00 | NDC 87701099893

## 2012-07-08 MED ADMIN — insulin lispro (HUMALOG) injection 2-3 Units: SUBCUTANEOUS | NDC 00002751017

## 2012-07-08 MED ADMIN — B complex-vitaminC-folic acid (NEPHROCAP) cap: ORAL | @ 14:00:00 | NDC 68084006511

## 2012-07-08 MED ADMIN — 0.9% sodium chloride infusion: INTRAVENOUS | @ 22:00:00 | NDC 00409798309

## 2012-07-08 MED ADMIN — sodium chloride (NS) flush 5-10 mL: INTRAVENOUS | @ 10:00:00 | NDC 87701099893

## 2012-07-08 MED ADMIN — HYDROmorphone (PF) (DILAUDID) injection 2 mg: INTRAVENOUS | @ 22:00:00 | NDC 00409255201

## 2012-07-08 MED ADMIN — 0.9% sodium chloride infusion: INTRAVENOUS | @ 13:00:00 | NDC 00409798303

## 2012-07-08 MED ADMIN — sodium chloride 0.9 % injection: @ 16:00:00 | NDC 63323018610

## 2012-07-08 MED ADMIN — HYDROmorphone (PF) (DILAUDID) injection 2 mg: INTRAVENOUS | @ 16:00:00 | NDC 00409255201

## 2012-07-08 MED ADMIN — sodium chloride (NS) flush 5-10 mL: INTRAVENOUS | @ 01:00:00 | NDC 87701099893

## 2012-07-08 MED ADMIN — HYDROmorphone (PF) (DILAUDID) injection 2 mg: INTRAVENOUS | @ 19:00:00 | NDC 00409255201

## 2012-07-08 MED ADMIN — calcium acetate (PHOSLO) tablet 1,334 mg: ORAL | @ 16:00:00 | NDC 00574011302

## 2012-07-08 MED ADMIN — amLODIPine (NORVASC) tablet 10 mg: ORAL | @ 14:00:00 | NDC 59762153006

## 2012-07-08 MED FILL — ONDANSETRON (PF) 4 MG/2 ML INJECTION: 4 mg/2 mL | INTRAMUSCULAR | Qty: 2

## 2012-07-08 MED FILL — HEPARIN (PORCINE) 5,000 UNIT/ML IJ SOLN: 5000 unit/mL | INTRAMUSCULAR | Qty: 1

## 2012-07-08 MED FILL — HYDROMORPHONE (PF) 1 MG/ML IJ SOLN: 1 mg/mL | INTRAMUSCULAR | Qty: 2

## 2012-07-08 MED FILL — INSULIN LISPRO 100 UNIT/ML INJECTION: 100 unit/mL | SUBCUTANEOUS | Qty: 1

## 2012-07-08 MED FILL — BD POSIFLUSH NORMAL SALINE 0.9 % INJECTION SYRINGE: INTRAMUSCULAR | Qty: 10

## 2012-07-08 MED FILL — CALCIUM ACETATE 667 MG TAB: 667 mg | ORAL | Qty: 2

## 2012-07-08 MED FILL — NEPHROCAPS 1 MG CAPSULE: 1 mg | ORAL | Qty: 1

## 2012-07-08 MED FILL — LISINOPRIL 20 MG TAB: 20 mg | ORAL | Qty: 1

## 2012-07-08 MED FILL — BD POSIFLUSH NORMAL SALINE 0.9 % INJECTION SYRINGE: INTRAMUSCULAR | Qty: 20

## 2012-07-08 MED FILL — SODIUM CHLORIDE 0.9 % IV: INTRAVENOUS | Qty: 1000

## 2012-07-08 MED FILL — AMLODIPINE 5 MG TAB: 5 mg | ORAL | Qty: 2

## 2012-07-08 MED FILL — SODIUM CHLORIDE 0.9 % INJECTION: INTRAMUSCULAR | Qty: 10

## 2012-07-08 MED FILL — HYDROXYUREA 500 MG CAPSULE: 500 mg | ORAL | Qty: 1

## 2012-07-08 NOTE — Progress Notes (Signed)
Patient out of room.  Labs stable.  Will see in AM.

## 2012-07-08 NOTE — Progress Notes (Signed)
Problem: Pain  Goal: *Control of Pain  Outcome: Not Progressing Towards Goal  Patient is requesting 2mg  dilaudid every 3 hours.

## 2012-07-08 NOTE — Progress Notes (Signed)
Bedside shift change report given to britney blevins rn (oncoming nurse) by Dede Query rn (offgoing nurse).  Report given with SBAR.

## 2012-07-08 NOTE — Progress Notes (Signed)
Hospitalist Progress Note          Tyler Ballard   [phone]  Call physician on-call through the operator 7pm-7am    Daily Progress Note: 07/08/2012    Assessment/Plan:     1. Sickle Cell Pain Crises. Still c/o pain all over. Denies chest pain. Cont current pain control, gentle hydration.(poor oral intake d/t nausea)  2. ESRD. On HD T/Th/Sat- HD yesterday  C/o vomiting- no witness. Con zofran for nausea.   3. HTN . BP stable  4. DM2 . -cont ss, lantus  5. DVT Prophylaxis. On sq heparin    DC home tomorrow once better oral intake           Subjective:   C/o Generalzied body aches and pains. + Nausea, vomiting. No cp, sob    Review of Systems:   neg    Objective:   Physical Exam:     BP 127/71   Pulse 76   Temp 98.1 ??F (36.7 ??C)   Resp 16   Ht 5\' 5"  (1.651 m)   Wt 175 lb   BMI 29.12 kg/m2   SpO2 99%   O2 Device: Room air    Temp (24hrs), Avg:97.6 ??F (36.4 ??C), Min:96.6 ??F (35.9 ??C), Max:98.1 ??F (36.7 ??C)        07/19 1900 - 07/21 0659  In: 960 [P.O.:960]  Out: 5000     General:  Alert, cooperative, no distress, appears stated age.   Lungs:   Clear to auscultation bilaterally.   Chest wall:  No tenderness or deformity.   Heart:  Regular rate and rhythm, S1, S2 normal, no murmur, click, rub or gallop.   Abdomen:   Soft, non-tender. Bowel sounds normal. No masses,  No organomegaly.   Extremities: Extremities normal, atraumatic, no cyanosis or edema.   Pulses: 2+ and symmetric all extremities.   Skin: Skin color, texture, turgor normal. No rashes or lesions   Neurologic: CNII-XII intact.      Data Review:       Recent Days:  Recent Labs   [facility] 07/08/12 0019 07/07/12 0959 07/05/12 2214    WBC 3.4* 3.8* 4.2    HGB 9.7* 9.0* 9.4*    HCT 30.3* 27.7* 29.7*    PLT 183 190 181     Recent Labs   Basename 07/08/12 0019 07/07/12 0959 07/05/12 2214    NA 133* 132* 132*    K 5.0 5.4* 4.5    CL 95* 100 94*    CO2 31 22 28     GLU 92 102* 175*    BUN 30* 55* 41*    CREA 6.08* 9.38* 7.52*    CA 8.9 8.9  8.9    MG -- 2.3 --    PHOS -- 5.2* --    ALB -- -- 3.1*    TBIL -- -- 0.3    SGOT -- -- 14*    INR -- -- --     No results found for this basename: PH:3,PCO2:3,PO2:3,HCO3:3,FIO2:3 in the last 72 hours    24 Hour Results:  Recent Results (from the past 24 hour(s))   METABOLIC PANEL, BASIC    Collection Time    07/07/12  9:59 AM       Component Value Range    Sodium 132 (*) 136 - 145 MMOL/L    Potassium 5.4 (*) 3.5 - 5.1 MMOL/L    Chloride 100  97 - 108 MMOL/L    CO2 22  21 - 32  MMOL/L    Anion gap 10  5 - 15 mmol/L    Glucose 102 (*) 65 - 100 MG/DL    BUN 55 (*) 6 - 20 MG/DL    Creatinine 6.96 (*) 0.45 - 1.15 MG/DL    BUN/Creatinine ratio 6 (*) 12 - 20      GFR est-AA 8 (*) >60 ml/min/1.60m2    GFR est non-AA 7 (*) >60 ml/min/1.61m2    Calcium 8.9  8.5 - 10.1 MG/DL   CBC WITH AUTOMATED DIFF    Collection Time    07/07/12  9:59 AM       Component Value Range    WBC 3.8 (*) 4.1 - 11.1 K/uL    RBC 2.97 (*) 4.10 - 5.70 M/uL    HGB 9.0 (*) 12.1 - 17.0 g/dL    HCT 29.5 (*) 28.4 - 50.3 %    MCV 93.3  80.0 - 99.0 FL    MCH 30.3  26.0 - 34.0 PG    MCHC 32.5  30.0 - 36.5 g/dL    RDW 13.2 (*) 44.0 - 14.5 %    PLATELET 190  150 - 400 K/uL    NEUTROPHILS 49  32 - 75 %    LYMPHOCYTES 24  12 - 49 %    MONOCYTES 13  5 - 13 %    EOSINOPHILS 13 (*) 0 - 7 %    BASOPHILS 1  0 - 1 %    ABS. NEUTROPHILS 1.9  1.8 - 8.0 K/UL    ABS. LYMPHOCYTES 0.9  0.8 - 3.5 K/UL    ABS. MONOCYTES 0.5  0.0 - 1.0 K/UL    ABS. EOSINOPHILS 0.5 (*) 0.0 - 0.4 K/UL    ABS. BASOPHILS 0.0  0.0 - 0.1 K/UL   MAGNESIUM    Collection Time    07/07/12  9:59 AM       Component Value Range    Magnesium 2.3  1.6 - 2.4 MG/DL   PHOSPHORUS    Collection Time    07/07/12  9:59 AM       Component Value Range    Phosphorus 5.2 (*) 2.5 - 4.9 MG/DL   GLUCOSE, POC    Collection Time    07/07/12 12:26 PM       Component Value Range    POC GLUCOSE 99  75 - 110 mg/dL    Performed by Levonne Hubert     LD    Collection Time    07/07/12  3:26 PM       Component Value Range    LD 323 (*) 85  - 241 U/L   HAPTOGLOBIN    Collection Time    07/07/12  3:26 PM       Component Value Range    Haptoglobin 127  30 - 200 mg/dL   GLUCOSE, POC    Collection Time    07/07/12  5:02 PM       Component Value Range    POC GLUCOSE 167 (*) 75 - 110 mg/dL    Performed by Lelon Frohlich     GLUCOSE, POC    Collection Time    07/07/12  8:04 PM       Component Value Range    POC GLUCOSE 187 (*) 75 - 110 mg/dL    Performed by Sherwood Gambler     METABOLIC PANEL, BASIC    Collection Time    07/08/12 12:19 AM       Component Value  Range    Sodium 133 (*) 136 - 145 MMOL/L    Potassium 5.0  3.5 - 5.1 MMOL/L    Chloride 95 (*) 97 - 108 MMOL/L    CO2 31  21 - 32 MMOL/L    Anion gap 7  5 - 15 mmol/L    Glucose 92  65 - 100 MG/DL    BUN 30 (*) 6 - 20 MG/DL    Creatinine 1.61 (*) 0.45 - 1.15 MG/DL    BUN/Creatinine ratio 5 (*) 12 - 20      GFR est-AA 13 (*) >60 ml/min/1.88m2    GFR est non-AA 11 (*) >60 ml/min/1.33m2    Calcium 8.9  8.5 - 10.1 MG/DL   CBC WITH AUTOMATED DIFF    Collection Time    07/08/12 12:19 AM       Component Value Range    WBC 3.4 (*) 4.1 - 11.1 K/uL    RBC 3.15 (*) 4.10 - 5.70 M/uL    HGB 9.7 (*) 12.1 - 17.0 g/dL    HCT 09.6 (*) 04.5 - 50.3 %    MCV 96.2  80.0 - 99.0 FL    MCH 30.8  26.0 - 34.0 PG    MCHC 32.0  30.0 - 36.5 g/dL    RDW 40.9 (*) 81.1 - 14.5 %    PLATELET 183  150 - 400 K/uL    NEUTROPHILS 50  32 - 75 %    LYMPHOCYTES 24  12 - 49 %    MONOCYTES 11  5 - 13 %    EOSINOPHILS 13 (*) 0 - 7 %    BASOPHILS 2 (*) 0 - 1 %    ABS. NEUTROPHILS 1.7 (*) 1.8 - 8.0 K/UL    ABS. LYMPHOCYTES 0.8  0.8 - 3.5 K/UL    ABS. MONOCYTES 0.4  0.0 - 1.0 K/UL    ABS. EOSINOPHILS 0.4  0.0 - 0.4 K/UL    ABS. BASOPHILS 0.1  0.0 - 0.1 K/UL    DF SMEAR SCANNED      RBC COMMENTS        Value: 1+ ANISOCYTOSIS      OVALOCYTES PRESENT      TARGET CELLS PRESENT   GLUCOSE, POC    Collection Time    07/08/12  6:19 AM       Component Value Range    POC GLUCOSE 112 (*) 75 - 110 mg/dL    Performed by [facility]         Problem List:  Problem  List as of 07/08/2012  Date Reviewed: 05-Aug-2012      Codes Class Noted - Resolved    ESRD (end stage renal disease) on dialysis (Chronic) 585.6  08-05-12 - Present        HTN (hypertension) (Chronic) 401.9  August 05, 2012 - Present        *Sickle cell anemia with pain (Chronic) 282.62  07/05/2012 - Present              Medications reviewed  Current Facility-Administered Medications   Medication Dose Route Frequency   ??? 0.9% sodium chloride infusion  50 mL/hr IntraVENous CONTINUOUS   ??? sodium chloride (NS) flush 5-10 mL  5-10 mL IntraVENous PRN   ??? sodium chloride (NS) flush 5-10 mL  5-10 mL IntraVENous Q8H   ??? oxyCODONE-acetaminophen (PERCOCET) 5-325 mg per tablet 1 Tab  1 Tab Oral Q6H PRN   ??? B complex-vitaminC-folic acid (NEPHROCAP) cap  1 Cap Oral DAILY   ???  calcium acetate (PHOSLO) tablet 1,334 mg  2 Tab Oral TID WITH MEALS   ??? hydroxyurea (HYDREA) chemo cap 500 mg  500 mg Oral DAILY   ??? insulin glargine (LANTUS) injection 8 Units  8 Units SubCUTAneous ACB   ??? acetaminophen (TYLENOL) tablet 650 mg  650 mg Oral Q4H PRN   ??? acetaminophen (TYLENOL) tablet 650 mg  650 mg Oral Q4H PRN   ??? ondansetron (ZOFRAN) injection 4 mg  4 mg IntraVENous Q4H PRN   ??? magnesium hydroxide (MILK OF MAGNESIA) oral suspension 30 mL  30 mL Oral DAILY PRN   ??? docusate sodium (COLACE) capsule 100 mg  100 mg Oral BID   ??? bisacodyl (DULCOLAX) suppository 10 mg  10 mg Rectal DAILY PRN   ??? heparin (porcine) injection 5,000 Units  5,000 Units SubCUTAneous Q8H   ??? prochlorperazine (COMPAZINE) with saline injection 5 mg  5 mg IntraVENous Q6H PRN   ??? insulin lispro (HUMALOG) injection 2-3 Units  2-3 Units SubCUTAneous QHS   ??? HYDROmorphone (PF) (DILAUDID) injection 2 mg  2 mg IntraVENous Q3H PRN   ??? amLODIPine (NORVASC) tablet 10 mg  10 mg Oral DAILY   ??? lisinopril (PRINIVIL, ZESTRIL) tablet 20 mg  20 mg Oral DAILY   ??? cloNIDine (CATAPRES) tablet 0.1 mg  0.1 mg Oral Q4H PRN   ??? epoetin alfa (EPOGEN;PROCRIT) injection 10,000 Units  10,000 Units  SubCUTAneous 2 Days a week   ??? albumin human 25% (BUMINATE) solution 25 g  25 g IntraVENous PRN       Vevelyn Royals, DO

## 2012-07-09 LAB — GLUCOSE, POC
Glucose (POC): 123 mg/dL — ABNORMAL HIGH (ref 75–110)
Glucose (POC): 129 mg/dL — ABNORMAL HIGH (ref 75–110)
Glucose (POC): 145 mg/dL — ABNORMAL HIGH (ref 75–110)
Glucose (POC): 98 mg/dL (ref 75–110)

## 2012-07-09 LAB — METABOLIC PANEL, BASIC
Anion gap: 8 mmol/L (ref 5–15)
BUN/Creatinine ratio: 5 — ABNORMAL LOW (ref 12–20)
BUN: 42 MG/DL — ABNORMAL HIGH (ref 6–20)
CO2: 30 MMOL/L (ref 21–32)
Calcium: 8.9 MG/DL (ref 8.5–10.1)
Chloride: 96 MMOL/L — ABNORMAL LOW (ref 97–108)
Creatinine: 8.13 MG/DL — ABNORMAL HIGH (ref 0.45–1.15)
GFR est AA: 9 mL/min/{1.73_m2} — ABNORMAL LOW (ref >60)
GFR est non-AA: 8 mL/min/{1.73_m2} — ABNORMAL LOW (ref >60)
Glucose: 81 MG/DL (ref 65–100)
Potassium: 5.3 MMOL/L — ABNORMAL HIGH (ref 3.5–5.1)
Sodium: 134 MMOL/L — ABNORMAL LOW (ref 136–145)

## 2012-07-09 LAB — CBC WITH AUTOMATED DIFF
ABS. BASOPHILS: 0 10*3/uL (ref 0.0–0.1)
ABS. EOSINOPHILS: 0.5 10*3/uL — ABNORMAL HIGH (ref 0.0–0.4)
ABS. LYMPHOCYTES: 1 10*3/uL (ref 0.8–3.5)
ABS. MONOCYTES: 0.4 10*3/uL (ref 0.0–1.0)
ABS. NEUTROPHILS: 1.1 10*3/uL — ABNORMAL LOW (ref 1.8–8.0)
BASOPHILS: 0 % (ref 0–1)
EOSINOPHILS: 16 % — ABNORMAL HIGH (ref 0–7)
HCT: 27.5 % — ABNORMAL LOW (ref 36.6–50.3)
HGB: 8.8 g/dL — ABNORMAL LOW (ref 12.1–17.0)
LYMPHOCYTES: 34 % (ref 12–49)
MCH: 31 PG (ref 26.0–34.0)
MCHC: 32 g/dL (ref 30.0–36.5)
MCV: 96.8 FL (ref 80.0–99.0)
MONOCYTES: 14 % — ABNORMAL HIGH (ref 5–13)
NEUTROPHILS: 36 % (ref 32–75)
PLATELET: 148 10*3/uL — ABNORMAL LOW (ref 150–400)
RBC: 2.84 M/uL — ABNORMAL LOW (ref 4.10–5.70)
RDW: 15.7 % — ABNORMAL HIGH (ref 11.5–14.5)
WBC: 3 10*3/uL — ABNORMAL LOW (ref 4.1–11.1)

## 2012-07-09 MED ADMIN — calcium acetate (PHOSLO) tablet 1,334 mg: ORAL | @ 22:00:00 | NDC 00574011302

## 2012-07-09 MED ADMIN — HYDROmorphone (PF) (DILAUDID) injection 2 mg: INTRAVENOUS | @ 02:00:00 | NDC 00409255201

## 2012-07-09 MED ADMIN — insulin lispro (HUMALOG) injection 2-3 Units: SUBCUTANEOUS | NDC 00002751017

## 2012-07-09 MED ADMIN — HYDROmorphone (PF) (DILAUDID) injection 2 mg: INTRAVENOUS | @ 19:00:00 | NDC 00409255201

## 2012-07-09 MED ADMIN — HYDROmorphone (PF) (DILAUDID) injection 2 mg: INTRAVENOUS | @ 22:00:00 | NDC 00409255201

## 2012-07-09 MED ADMIN — sodium chloride (NS) flush 5-10 mL: INTRAVENOUS | @ 10:00:00 | NDC 87701099893

## 2012-07-09 MED ADMIN — HYDROmorphone (PF) (DILAUDID) injection 2 mg: INTRAVENOUS | @ 16:00:00 | NDC 00409255201

## 2012-07-09 MED ADMIN — lisinopril (PRINIVIL, ZESTRIL) tablet 20 mg: ORAL | @ 13:00:00 | NDC 68084006211

## 2012-07-09 MED ADMIN — HYDROmorphone (PF) (DILAUDID) injection 2 mg: INTRAVENOUS | @ 09:00:00 | NDC 00409255201

## 2012-07-09 MED ADMIN — HYDROmorphone (PF) (DILAUDID) injection 2 mg: INTRAVENOUS | @ 06:00:00 | NDC 00409255201

## 2012-07-09 MED ADMIN — amLODIPine (NORVASC) tablet 10 mg: ORAL | @ 13:00:00 | NDC 59762153006

## 2012-07-09 MED ADMIN — sodium chloride (NS) flush 5-10 mL: INTRAVENOUS | @ 02:00:00 | NDC 87701099893

## 2012-07-09 MED ADMIN — insulin glargine (LANTUS) injection 8 Units: SUBCUTANEOUS | @ 11:00:00 | NDC 09999222001

## 2012-07-09 MED ADMIN — calcium acetate (PHOSLO) tablet 1,334 mg: ORAL | @ 16:00:00 | NDC 00574011302

## 2012-07-09 MED ADMIN — sodium chloride (NS) flush 5-10 mL: INTRAVENOUS | @ 19:00:00 | NDC 87701099893

## 2012-07-09 MED ADMIN — calcium acetate (PHOSLO) tablet 1,334 mg: ORAL | @ 13:00:00 | NDC 00574011302

## 2012-07-09 MED ADMIN — heparin (porcine) injection 5,000 Units: SUBCUTANEOUS | @ 06:00:00 | NDC 25021040201

## 2012-07-09 MED ADMIN — B complex-vitaminC-folic acid (NEPHROCAP) cap: ORAL | @ 13:00:00 | NDC 68084006511

## 2012-07-09 MED ADMIN — epoetin alfa (EPOGEN;PROCRIT) injection 10,000 Units: SUBCUTANEOUS | @ 22:00:00 | NDC 59676031000

## 2012-07-09 MED ADMIN — HYDROmorphone (PF) (DILAUDID) injection 2 mg: INTRAVENOUS | @ 13:00:00 | NDC 00409255201

## 2012-07-09 MED FILL — HYDROMORPHONE (PF) 1 MG/ML IJ SOLN: 1 mg/mL | INTRAMUSCULAR | Qty: 2

## 2012-07-09 MED FILL — HEPARIN (PORCINE) 5,000 UNIT/ML IJ SOLN: 5000 unit/mL | INTRAMUSCULAR | Qty: 1

## 2012-07-09 MED FILL — INSULIN GLARGINE 100 UNIT/ML INJECTION: 100 unit/mL | SUBCUTANEOUS | Qty: 0.08

## 2012-07-09 MED FILL — AMLODIPINE 5 MG TAB: 5 mg | ORAL | Qty: 2

## 2012-07-09 MED FILL — HYDROXYUREA 500 MG CAPSULE: 500 mg | ORAL | Qty: 1

## 2012-07-09 MED FILL — PROCRIT 10,000 UNIT/ML INJECTION SOLUTION: 10000 unit/mL | INTRAMUSCULAR | Qty: 1

## 2012-07-09 MED FILL — BD POSIFLUSH NORMAL SALINE 0.9 % INJECTION SYRINGE: INTRAMUSCULAR | Qty: 10

## 2012-07-09 MED FILL — NEPHROCAPS 1 MG CAPSULE: 1 mg | ORAL | Qty: 1

## 2012-07-09 MED FILL — CALCIUM ACETATE 667 MG TAB: 667 mg | ORAL | Qty: 2

## 2012-07-09 MED FILL — LISINOPRIL 20 MG TAB: 20 mg | ORAL | Qty: 1

## 2012-07-09 NOTE — Progress Notes (Signed)
Bedside shift change report given to Jennifer B, RN (oncoming nurse) by Britney, RN (offgoing nurse).  Report given with SBAR.

## 2012-07-09 NOTE — Progress Notes (Signed)
Spiritual Care Assessment/Progress Notes    Tyler Ballard [id number]  [id number]    31-Mar-1981  31 y.o.  male    Patient Telephone Number: [phone] (home)   Religious Affiliation: [employer]   Language: English   Extended Emergency Contact Information  Primary Emergency Contact: [facility] [place]  Home Phone: [phone]  Relation: Other Relative   Patient Active Problem List   Diagnoses Date Noted   ??? ESRD (end stage renal disease) on dialysis 07/06/2012   ??? HTN (hypertension) 07/06/2012   ??? Sickle cell anemia with pain 07/05/2012        Date: 07/09/2012       Level of Religious/Spiritual Activity:  [x]          Involved in faith tradition/spiritual practice    []          Not involved in faith tradition/spiritual practice  []          Spiritually oriented    []          Claims no spiritual orientation    []          seeking spiritual identity  []          Feels alienated from religious practice/tradition  []          Feels angry about religious practice/tradition  [x]          Spirituality/religious tradition is a Theatre stage manager for coping at this time.  []          Not able to assess due to medical condition    Services Provided Today:  []          crisis intervention    []          reading Scriptures  [x]          spiritual assessment    []          prayer  [x]          empathic listening/emotional support  []          rites and rituals (cite in comments)  []          life review     []          religious support  []          theological development   []          advocacy  []          ethical dialog     []          blessing  []          bereavement support    []          support to family  []          anticipatory grief support   []          help with AMD  [x]          spiritual guidance    []          meditation      Spiritual Care Needs  [x]          Emotional Support  [x]          Spiritual/Religious Care  []          Loss/Adjustment  []          Advocacy/Referral /Ethics  []          No needs expressed at this time   []          Other: (note in comments)  Spiritual Care Plan  []   Follow up visits with pt/family  []          Provide materials  []          Schedule sacraments  []          Contact Community Clergy  [x]          Follow up as needed  []          Other: (note in comments)     Comments: Provided emotional and spiritual encouragement. Patient would like to spend more time out of the hospital but knows he has to deal with his illness. Assured him of prayer. Confirmed he is a [place], but no home church.    Tyler Ballard , Chaplain Intern

## 2012-07-09 NOTE — Progress Notes (Signed)
Bedside shift change report given to britney blevins rn (oncoming nurse) by Dede Query rn (offgoing nurse).  Report given with SBAR and MAR.

## 2012-07-09 NOTE — Progress Notes (Signed)
Hospitalist Progress Note          Jannette Spanner MD   [phone]  Call physician on-call through the operator 7pm-7am    Daily Progress Note: 07/09/2012    Assessment/Plan:     1. Sickle Cell Pain Crises. Still c/o pain all over. msk pain,Denies chest pain. Cont current pain control, gentle hydration.(poor oral intake d/t nausea)  2. ESRD. On HD T/Th/Sat- HD    C/o vomiting- no witness. Con zofran for nausea.   3. HTN . BP stable  4. DM2 . -cont ss, lantus  5. DVT Prophylaxis. On sq heparin    DC home tomorrow once better oral intake           Subjective:   C/o Generalzied body aches and pains. + Nausea, vomiting. No cp, sob,ha,dz,abd.pain    Review of Systems:   As above    Objective:   Physical Exam:     BP 153/86   Pulse 65   Temp 96.5 ??F (35.8 ??C)   Resp 16   Ht 5\' 5"  (1.651 m)   Wt 175 lb   BMI 29.12 kg/m2   SpO2 99%   O2 Device: Room air    Temp (24hrs), Avg:96.8 ??F (36 ??C), Min:96.5 ??F (35.8 ??C), Max:97.3 ??F (36.3 ??C)        07/20 1900 - 07/22 0659  In: 1320 [P.O.:1320]  Out: -     General:  Alert, cooperative, no distress, appears stated age.   Lungs:   Clear to auscultation bilaterally.        Heart:  Regular rate and rhythm, S1, S2 normal,      Abdomen:   Soft, non-tender. Bowel sounds normal.       Extremities:   Edema.+            Neurologic: CNII-XII intact.      Data Review:       Recent Days:  Recent Labs   [facility] 07/09/12 0156 07/08/12 0019 07/07/12 0959    WBC 3.0* 3.4* 3.8*    HGB 8.8* 9.7* 9.0*    HCT 27.5* 30.3* 27.7*    PLT 148* 183 190     Recent Labs   Basename 07/09/12 0156 07/08/12 0019 07/07/12 0959    NA 134* 133* 132*    K 5.3* 5.0 5.4*    CL 96* 95* 100    CO2 30 31 22     GLU 81 92 102*    BUN 42* 30* 55*    CREA 8.13* 6.08* 9.38*    CA 8.9 8.9 8.9    MG -- -- 2.3    PHOS -- -- 5.2*    ALB -- -- --    TBIL -- -- --    SGOT -- -- --    INR -- -- --     No results found for this basename: PH:3,PCO2:3,PO2:3,HCO3:3,FIO2:3 in the last 72 hours    24 Hour  Results:  Recent Results (from the past 24 hour(s))   GLUCOSE, POC    Collection Time    07/08/12 12:33 PM       Component Value Range    POC GLUCOSE 134 (*) 75 - 110 mg/dL    Performed by Levonne Hubert     GLUCOSE, POC    Collection Time    07/08/12  4:42 PM       Component Value Range    POC GLUCOSE 132 (*) 75 - 110 mg/dL    Performed by  THOMPSON MEGAN     GLUCOSE, POC    Collection Time    07/08/12  7:24 PM       Component Value Range    POC GLUCOSE 155 (*) 75 - 110 mg/dL    Performed by Lelon Frohlich     METABOLIC PANEL, BASIC    Collection Time    07/09/12  1:56 AM       Component Value Range    Sodium 134 (*) 136 - 145 MMOL/L    Potassium 5.3 (*) 3.5 - 5.1 MMOL/L    Chloride 96 (*) 97 - 108 MMOL/L    CO2 30  21 - 32 MMOL/L    Anion gap 8  5 - 15 mmol/L    Glucose 81  65 - 100 MG/DL    BUN 42 (*) 6 - 20 MG/DL    Creatinine 2.84 (*) 0.45 - 1.15 MG/DL    BUN/Creatinine ratio 5 (*) 12 - 20      GFR est-AA 9 (*) >60 ml/min/1.57m2    GFR est non-AA 8 (*) >60 ml/min/1.53m2    Calcium 8.9  8.5 - 10.1 MG/DL   CBC WITH AUTOMATED DIFF    Collection Time    07/09/12  1:56 AM       Component Value Range    WBC 3.0 (*) 4.1 - 11.1 K/uL    RBC 2.84 (*) 4.10 - 5.70 M/uL    HGB 8.8 (*) 12.1 - 17.0 g/dL    HCT 13.2 (*) 44.0 - 50.3 %    MCV 96.8  80.0 - 99.0 FL    MCH 31.0  26.0 - 34.0 PG    MCHC 32.0  30.0 - 36.5 g/dL    RDW 10.2 (*) 72.5 - 14.5 %    PLATELET 148 (*) 150 - 400 K/uL    NEUTROPHILS 36  32 - 75 %    LYMPHOCYTES 34  12 - 49 %    MONOCYTES 14 (*) 5 - 13 %    EOSINOPHILS 16 (*) 0 - 7 %    BASOPHILS 0  0 - 1 %    ABS. NEUTROPHILS 1.1 (*) 1.8 - 8.0 K/UL    ABS. LYMPHOCYTES 1.0  0.8 - 3.5 K/UL    ABS. MONOCYTES 0.4  0.0 - 1.0 K/UL    ABS. EOSINOPHILS 0.5 (*) 0.0 - 0.4 K/UL    ABS. BASOPHILS 0.0  0.0 - 0.1 K/UL    DF MANUAL      RBC COMMENTS        Value: 1+ ANISOCYTOSIS      OVALOCYTES PRESENT    WBC COMMENTS Differential performed on albumin smear     GLUCOSE, POC    Collection Time    07/09/12  7:26 AM       Component  Value Range    POC GLUCOSE 98  75 - 110 mg/dL    Performed by Dede Query         Problem List:  Problem List as of 07/09/2012  Date Reviewed: Jul 11, 2012      Codes Class Noted - Resolved    ESRD (end stage renal disease) on dialysis (Chronic) 585.6  Jul 11, 2012 - Present        HTN (hypertension) (Chronic) 401.9  11-Jul-2012 - Present        *Sickle cell anemia with pain (Chronic) 282.62  07/05/2012 - Present              Medications reviewed  Current Facility-Administered Medications   Medication Dose Route Frequency   ??? 0.9% sodium chloride infusion  50 mL/hr IntraVENous CONTINUOUS   ??? sodium chloride 0.9 % injection       ??? sodium chloride (NS) flush 5-10 mL  5-10 mL IntraVENous PRN   ??? sodium chloride (NS) flush 5-10 mL  5-10 mL IntraVENous Q8H   ??? oxyCODONE-acetaminophen (PERCOCET) 5-325 mg per tablet 1 Tab  1 Tab Oral Q6H PRN   ??? B complex-vitaminC-folic acid (NEPHROCAP) cap  1 Cap Oral DAILY   ??? calcium acetate (PHOSLO) tablet 1,334 mg  2 Tab Oral TID WITH MEALS   ??? hydroxyurea (HYDREA) chemo cap 500 mg  500 mg Oral DAILY   ??? insulin glargine (LANTUS) injection 8 Units  8 Units SubCUTAneous ACB   ??? acetaminophen (TYLENOL) tablet 650 mg  650 mg Oral Q4H PRN   ??? acetaminophen (TYLENOL) tablet 650 mg  650 mg Oral Q4H PRN   ??? ondansetron (ZOFRAN) injection 4 mg  4 mg IntraVENous Q4H PRN   ??? magnesium hydroxide (MILK OF MAGNESIA) oral suspension 30 mL  30 mL Oral DAILY PRN   ??? docusate sodium (COLACE) capsule 100 mg  100 mg Oral BID   ??? bisacodyl (DULCOLAX) suppository 10 mg  10 mg Rectal DAILY PRN   ??? heparin (porcine) injection 5,000 Units  5,000 Units SubCUTAneous Q8H   ??? prochlorperazine (COMPAZINE) with saline injection 5 mg  5 mg IntraVENous Q6H PRN   ??? insulin lispro (HUMALOG) injection 2-3 Units  2-3 Units SubCUTAneous QHS   ??? HYDROmorphone (PF) (DILAUDID) injection 2 mg  2 mg IntraVENous Q3H PRN   ??? amLODIPine (NORVASC) tablet 10 mg  10 mg Oral DAILY   ??? lisinopril (PRINIVIL, ZESTRIL) tablet 20 mg  20 mg Oral  DAILY   ??? cloNIDine (CATAPRES) tablet 0.1 mg  0.1 mg Oral Q4H PRN   ??? epoetin alfa (EPOGEN;PROCRIT) injection 10,000 Units  10,000 Units SubCUTAneous 2 Days a week   ??? albumin human 25% (BUMINATE) solution 25 g  25 g IntraVENous PRN       Gailya Tauer K. Meganne Rita, MD

## 2012-07-09 NOTE — Progress Notes (Signed)
NAME: Tyler Ballard        DOB:  14-Feb-1981        MRN:  [id number]        Assessment :    Plan:  --ESRD TTS  Anemia  Sickle cell crisis   HTN   AVF   Brawny edema    --Plan for HD tomorrow  Continue EPO  Pain control       Subjective:     Chief Complaint:  No complaints today  Review of Systems:    Symptom Y/N Comments  Symptom Y/N Comments   Fever/Chills    Chest Pain     Poor Appetite    Edema     Cough    Abdominal Pain     Sputum    Joint Pain     SOB/DOE    Pruritis/Rash     Nausea/vomit    Tolerating PT/OT     Diarrhea    Tolerating Diet     Constipation    Other       Could not obtain due to:      Objective:     VITALS:   Last 24hrs VS reviewed since prior progress note. Most recent are:  Visit Vitals   Item Reading   ??? BP 153/86   ??? Pulse 65   ??? Temp 96.5 ??F (35.8 ??C)   ??? Resp 16   ??? Ht 5\' 5"  (1.651 m)   ??? Wt 79.379 kg (175 lb)   ??? BMI 29.12 kg/m2   ??? SpO2 99%       Intake/Output Summary (Last 24 hours) at 07/09/12 1204  Last data filed at 07/09/12 1145   Gross per 24 hour   Intake    840 ml   Output      0 ml   Net    840 ml      Telemetry Reviewed:     PHYSICAL EXAM:  General:  no acute distress  2+ chronic edema      Lab Data Reviewed: (see below)    Medications Reviewed: (see below)    PMH/SH reviewed - no change compared to H&P  ________________________________________________________________________  Care Plan discussed with:  Patient     Family      RN     Care Manager                    Consultant:          Comments   >50% of visit spent in counseling and coordination of care       ________________________________________________________________________  Ellinor Test g abou-assi, MD     Procedures: see electronic medical records for all procedures/Xrays and details which  were not copied into this note but were reviewed prior to creation of Plan.      LABS:  Recent Labs   [facility] 07/09/12 0156 07/08/12 0019    WBC 3.0* 3.4*     HGB 8.8* 9.7*    HCT 27.5* 30.3*    PLT 148* 183     Recent Labs   Basename 07/09/12 0156 07/08/12 0019 07/07/12 0959    NA 134* 133* 132*    K 5.3* 5.0 5.4*    CL 96* 95* 100    CO2 30 31 22     BUN 42* 30* 55*    CREA 8.13* 6.08* 9.38*    GLU 81 92 102*    CA 8.9 8.9 8.9    MG -- -- 2.3  PHOS -- -- 5.2*    URICA -- -- --     No results found for this basename: SGOT:3,GPT:3,AP:3,TBIL:3,TP:3,ALB:3,GLOB:3,GGT:3,AML:3,AMYP:3,LPSE:3,HLPSE:3 in the last 72 hours  No results found for this basename: INR:3,PTP:3,APTT:3, in the last 72 hours   No results found for this basename: FE:2,TIBC:2,PSAT:2,FERR:2, in the last 72 hours   No components found with this basename: B12,  FOL,  FOLAT,  RBCF      No results found for this basename: PH:2,PCO2:2,PO2:2, in the last 72 hours  No results found for this basename: CPK:3,CKMB:3,TROPONINI:3, in the last 72 hours  Lab Results   Component Value Date/Time    POC GLUCOSE 98 07/09/2012  7:26 AM    POC GLUCOSE 155 07/08/2012  7:24 PM    POC GLUCOSE 132 07/08/2012  4:42 PM    POC GLUCOSE 134 07/08/2012 12:33 PM    POC GLUCOSE 112 07/08/2012  6:19 AM     No results found for this basename: color,  apprn,  spgru,  refsg,  phu,  protu,  glucu,  ketu,  bilu,  urou,  nitu,  leuku,  gluke,  epsu,  bactu,  wbcu,  rbcu,  casts,  ucry       MEDICATIONS:  Current Facility-Administered Medications   Medication Dose Route Frequency   ??? 0.9% sodium chloride infusion  50 mL/hr IntraVENous CONTINUOUS   ??? sodium chloride 0.9 % injection       ??? sodium chloride (NS) flush 5-10 mL  5-10 mL IntraVENous PRN   ??? sodium chloride (NS) flush 5-10 mL  5-10 mL IntraVENous Q8H   ??? oxyCODONE-acetaminophen (PERCOCET) 5-325 mg per tablet 1 Tab  1 Tab Oral Q6H PRN   ??? B complex-vitaminC-folic acid (NEPHROCAP) cap  1 Cap Oral DAILY   ??? calcium acetate (PHOSLO) tablet 1,334 mg  2 Tab Oral TID WITH MEALS   ??? hydroxyurea (HYDREA) chemo cap 500 mg  500 mg Oral DAILY   ??? insulin glargine (LANTUS) injection 8 Units  8 Units  SubCUTAneous ACB   ??? acetaminophen (TYLENOL) tablet 650 mg  650 mg Oral Q4H PRN   ??? acetaminophen (TYLENOL) tablet 650 mg  650 mg Oral Q4H PRN   ??? ondansetron (ZOFRAN) injection 4 mg  4 mg IntraVENous Q4H PRN   ??? magnesium hydroxide (MILK OF MAGNESIA) oral suspension 30 mL  30 mL Oral DAILY PRN   ??? docusate sodium (COLACE) capsule 100 mg  100 mg Oral BID   ??? bisacodyl (DULCOLAX) suppository 10 mg  10 mg Rectal DAILY PRN   ??? heparin (porcine) injection 5,000 Units  5,000 Units SubCUTAneous Q8H   ??? prochlorperazine (COMPAZINE) with saline injection 5 mg  5 mg IntraVENous Q6H PRN   ??? insulin lispro (HUMALOG) injection 2-3 Units  2-3 Units SubCUTAneous QHS   ??? HYDROmorphone (PF) (DILAUDID) injection 2 mg  2 mg IntraVENous Q3H PRN   ??? amLODIPine (NORVASC) tablet 10 mg  10 mg Oral DAILY   ??? lisinopril (PRINIVIL, ZESTRIL) tablet 20 mg  20 mg Oral DAILY   ??? cloNIDine (CATAPRES) tablet 0.1 mg  0.1 mg Oral Q4H PRN   ??? epoetin alfa (EPOGEN;PROCRIT) injection 10,000 Units  10,000 Units SubCUTAneous 2 Days a week   ??? albumin human 25% (BUMINATE) solution 25 g  25 g IntraVENous PRN

## 2012-07-10 LAB — GLUCOSE, POC
Glucose (POC): 119 mg/dL — ABNORMAL HIGH (ref 75–110)
Glucose (POC): 94 mg/dL (ref 75–110)

## 2012-07-10 MED ORDER — AMLODIPINE 5 MG TAB
5 mg | ORAL_TABLET | Freq: Every day | ORAL | Status: DC
Start: 2012-07-10 — End: 2013-05-26

## 2012-07-10 MED ORDER — LISINOPRIL 5 MG TAB
5 mg | ORAL_TABLET | Freq: Every day | ORAL | Status: DC
Start: 2012-07-10 — End: 2013-05-26

## 2012-07-10 MED ADMIN — HYDROmorphone (PF) (DILAUDID) injection 2 mg: INTRAVENOUS | @ 01:00:00 | NDC 00409255201

## 2012-07-10 MED ADMIN — sodium chloride (NS) flush 5-10 mL: INTRAVENOUS | @ 01:00:00 | NDC 87701099893

## 2012-07-10 MED ADMIN — HYDROmorphone (DILAUDID) tablet 2 mg: ORAL | @ 19:00:00 | NDC 68084042311

## 2012-07-10 MED ADMIN — HYDROmorphone (PF) (DILAUDID) injection 2 mg: INTRAVENOUS | @ 12:00:00 | NDC 00409255201

## 2012-07-10 MED ADMIN — sodium chloride (NS) flush 5-10 mL: INTRAVENOUS | @ 18:00:00 | NDC 87701099893

## 2012-07-10 MED ADMIN — insulin glargine (LANTUS) injection 8 Units: SUBCUTANEOUS | @ 11:00:00 | NDC 09999222001

## 2012-07-10 MED ADMIN — HYDROmorphone (PF) (DILAUDID) injection 2 mg: INTRAVENOUS | @ 06:00:00 | NDC 00409255201

## 2012-07-10 MED ADMIN — oxyCODONE-acetaminophen (PERCOCET) 5-325 mg per tablet 1 Tab: ORAL | @ 21:00:00 | NDC 68084035511

## 2012-07-10 MED ADMIN — sodium chloride (NS) flush 5-10 mL: INTRAVENOUS | @ 09:00:00 | NDC 87701099893

## 2012-07-10 MED ADMIN — HYDROmorphone (PF) (DILAUDID) injection 2 mg: INTRAVENOUS | @ 09:00:00 | NDC 00409255201

## 2012-07-10 MED ADMIN — HYDROmorphone (PF) (DILAUDID) injection 2 mg: INTRAVENOUS | @ 15:00:00 | NDC 00409255201

## 2012-07-10 MED ADMIN — sodium chloride (NS) flush 5-10 mL: INTRAVENOUS | @ 06:00:00 | NDC 87701099893

## 2012-07-10 MED FILL — HYDROMORPHONE 2 MG TAB: 2 mg | ORAL | Qty: 1

## 2012-07-10 MED FILL — HYDROMORPHONE (PF) 1 MG/ML IJ SOLN: 1 mg/mL | INTRAMUSCULAR | Qty: 2

## 2012-07-10 MED FILL — BD POSIFLUSH NORMAL SALINE 0.9 % INJECTION SYRINGE: INTRAMUSCULAR | Qty: 10

## 2012-07-10 MED FILL — BD POSIFLUSH NORMAL SALINE 0.9 % INJECTION SYRINGE: INTRAMUSCULAR | Qty: 20

## 2012-07-10 MED FILL — OXYCODONE-ACETAMINOPHEN 5 MG-325 MG TAB: 5-325 mg | ORAL | Qty: 1

## 2012-07-10 MED FILL — BD POSIFLUSH NORMAL SALINE 0.9 % INJECTION SYRINGE: INTRAMUSCULAR | Qty: 60

## 2012-07-10 NOTE — Other (Signed)
MEDICAL/SURGICAL INTERDISCIPLINARY ROUNDS      Patient Information:    Name:   Tyler Ballard    Age:   31 y.o.    Admission Date:   07/05/2012    Readmit Risk Assessment Information:   Prior Hospital Admissions  Patient Been Admitted to the Hospital Prior to this Encounter: Yes (greater than 30 days)  Name of the Hospital(s): [facility]  Why Were You in the Hospital?: sickle cell crisis  Specialist Care: No  Readmit Risk Tool  Scheduled Admission: No  Living Alone: Yes   Caregivers/Community Support: Yes  History of Mental Illness: No  Polypharmacy (Greater Than 7 Home Meds): Yes  Currently Prescribed : Insulin  Requires Financial, Physical and/or Educational Assistance With Medications: No  Independent with ADLs: Yes  Needs Assistance with Wound Care AND/OR Mgnt of O2, Nebulizer: No  Clinically Complex: Yes  History of Falls Within Past 3 Months: No  Chronic Medical Condition: Yes  Patient Directs Own Care: Yes  Readmit RISK Tool Score: 12    Surgery Date:  n/a    Day of Stay: 5    Expected Discharge Date:    today    Attending Provider:   Karsten Ro, MD    Surgeon:   n/a     Consultant:   n/a    Primary Care Provider:   Not On File    Problem List:     Patient Active Problem List   Diagnoses Code   ??? Sickle cell anemia with pain 282.62   ??? ESRD (end stage renal disease) on dialysis 585.6   ??? HTN (hypertension) 401.9       Principal Problem:  Sickle cell anemia with pain    Procedure:   n/a    During rounds the following quality care indicators and evidence based practices were addressed :   Pain Management              Heart Failure:    Not applicable        Pneumonia:    Not applicable    Stroke:      NIH Stroke Score        Transfer Level of Care:  Ready for Transfer    The patient will require the following interventions based on the Readmission Risk Assessment:  Pharmacy evaluation and teaching    Discharge Management:  Home    Anticipated Discharge Date:  today      Interdisciplinary team rounds  were held on 07/10/12 with the following team membersCare Management, Nursing and Nutrition and the  patient. Plan of care discussed. See clinical pathway and/or care plan for interventions and desired outcomes.

## 2012-07-10 NOTE — Progress Notes (Signed)
Bedside shift change report given to Gina Peterson RN (oncoming nurse) by Beth Powers RN (offgoing nurse).  Report given with SBAR, Kardex, MAR and Recent Results.

## 2012-07-10 NOTE — Progress Notes (Signed)
bp elevated, will hold clonidine d/t impending dialysis

## 2012-07-10 NOTE — Progress Notes (Signed)
Cab voucher was arranged for patient. Patient was told not to leave the floor until he was told the cab was here and we would escourt him down. 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 Patient was discharged and cab voucher was no longer supplied.

## 2012-07-10 NOTE — Discharge Summary (Signed)
Physician Discharge Summary     Patient ID:    Tyler Ballard  [id number]  30 y.o.  April 10, 1981    Admit date: 07/05/2012    Discharge date and time: 07/10/2012    Hospital Diagnoses and Treatment Rendered:   1. Sickle Cell Pain Crises.Improved,as per RN pt.ambulating ok with out much discomfort, Pain is getting better,Denies chest pain. Tolerating diet ok,change to po pain meds as he takes at home.   2. ESRD. On HD T/Th/Sat- HD , F/u with Dialysis and Nephrology.  3. HTN :Adjsuted meds,increased Norvasc to 10 mg and lisinopril to 10 mg and recommended to f/u with PCP.Discussed with the pt. About compliance with medications.  4. DM2 :Continue home meds and f/u with PCP.   5. DVT Prophylaxis. On sq heparin   Plan:continue current management,f/u with PCP and sickle cell center as out pt.   Disp: home           Chronic Diagnoses:    Problem List as of 07/10/2012  Date Reviewed: 07/25/2012      Codes Class Noted - Resolved    ESRD (end stage renal disease) on dialysis (Chronic) 585.6  July 25, 2012 - Present        HTN (hypertension) (Chronic) 401.9  07-25-12 - Present        *Sickle cell anemia with pain (Chronic) 282.62  07/05/2012 - Present              Discharge Medications:   Current Discharge Medication List      CONTINUE these medications which have CHANGED    Details   amLODIPine (NORVASC) 5 mg tablet Take 2 Tabs by mouth daily.  Qty: 30 Tab, Refills: 0      lisinopril (PRINIVIL, ZESTRIL) 5 mg tablet Take 2 Tabs by mouth daily.  Qty: 30 Tab, Refills: 0         CONTINUE these medications which have NOT CHANGED    Details   calcium acetate (PHOSLO) 667 mg cap Take 2 Caps by mouth three (3) times daily (with meals).      b complex-vitamin c-folic acid 0.8 mg (NEPHRO-VITE) 0.8 mg Tab tablet Take 1 Tab by mouth daily.      hydroxyurea (HYDREA) 500 mg capsule Take 500 mg by mouth daily.      insulin glargine (LANTUS) 100 unit/mL injection 8 Units by SubCUTAneous route Daily (before breakfast).       insulin aspart (NOVOLOG) 100 unit/mL injection 2-3 Units by SubCUTAneous route daily.      HYDROmorphone (DILAUDID) 4 mg tablet Take 4 mg by mouth every three (3) hours as needed.      promethazine (PHENERGAN) 25 mg tablet Take 25 mg by mouth every four (4) hours as needed.               Follow up Care:    1.Follow up with Primary care Physician,  Please call and set up an appointment to see them in 1 week.  2.Follow up with Sickle cell center in 1-2 weeks or sooner if needed.      Diet:  Renal Diet    Disposition:  Home.    Advanced Directive:   FULL x   DNR      Discharge Exam:  See today's note.    CONSULTATIONS: hematology/oncology    Significant Diagnostic Studies:   07/05/2012: BUN 41 MG/DL* (High; Range: 6 - 20 MG/DL); Calcium 8.9 MG/DL (Range: 8.5 - 40.9 MG/DL); CO2 28 MMOL/L (Range: 21 - 32 MMOL/L);  Creatinine 7.52 MG/DL* (High; Range: 0.96 - 0.45 MG/DL); Glucose 175 MG/DL* (High; Range: 65 - 409 MG/DL); HCT 81.1 %* (Low; Range: 36.6 - 50.3 %); HGB 9.4 g/dL* (Low; Range: 91.4 - 78.2 g/dL); Potassium 4.5 MMOL/L (Range: 3.5 - 5.1 MMOL/L); Sodium 132 MMOL/L* (Low; Range: 136 - 145 MMOL/L)    Recent Labs   [facility] 07/09/12 0156 07/08/12 0019    WBC 3.0* 3.4*    HGB 8.8* 9.7*    HCT 27.5* 30.3*    PLT 148* 183     Recent Labs   Basename 07/09/12 0156 07/08/12 0019    NA 134* 133*    K 5.3* 5.0    CL 96* 95*    CO2 30 31    BUN 42* 30*    CREA 8.13* 6.08*    GLU 81 92    CA 8.9 8.9    MG -- --    PHOS -- --    URICA -- --     No results found for this basename: SGOT:3,GPT:3,AP:3,TBIL:3,TP:3,ALB:3,GLOB:3,GGT:3,AML:3,AMYP:3,LPSE:3,HLPSE:3 in the last 72 hours  No results found for this basename: INR:3,PTP:3,APTT:3, in the last 72 hours   No results found for this basename: FE:2,TIBC:2,PSAT:2,FERR:2, in the last 72 hours   No results found for this basename: PH:2,PCO2:2,PO2:2, in the last 72 hours  No results found for this basename: CPK:3,CKMB:3,TROPONINI:3, in the last 72 hours  Lab Results   Component Value  Date/Time    POC GLUCOSE 119 07/10/2012  6:57 AM    POC GLUCOSE 129 07/09/2012  7:34 PM    POC GLUCOSE 145 07/09/2012  4:43 PM    POC GLUCOSE 123 07/09/2012 12:28 PM    POC GLUCOSE 98 07/09/2012  7:26 AM       CDMP Checked:   Yes x       Signed:  Deslyn Cavenaugh K. Lalania Haseman, MD  07/10/2012  11:18 AM

## 2012-07-10 NOTE — Progress Notes (Addendum)
Hospitalist Progress Note          Jannette Spanner MD   [phone]  Call physician on-call through the operator 7pm-7am    Daily Progress Note: 07/10/2012    Assessment/Plan:     1. Sickle Cell Pain Crises.Improving,as per RN ambulating ok,  Pain is getting better,Denies chest pain. Tolerating diet ok,change to po pain meds as he takes at home.  2. ESRD. On HD T/Th/Sat- HD  ,under going dialysis now.  3. GNF:AOZHYQMV meds,Neph. Is following  4. DM2 . -cont ss, lantus  5. DVT Prophylaxis. On sq heparin  Plan:continue current management,f/u with PCP and sickle cell center as out pt.  DC home         Subjective:   Pt. Is sitting in the chair comfortably    Review of Systems:   C/o of MSK pain,denied any cp,sob,ha,dz,v,abd.pain    Objective:   Physical Exam:     BP 120/62   Pulse 66   Temp 96 ??F (35.6 ??C)   Resp 17   Ht 5\' 5"  (1.651 m)   Wt 175 lb   BMI 29.12 kg/m2   SpO2 100%   O2 Device: Room air    Temp (24hrs), Avg:96.7 ??F (35.9 ??C), Min:96 ??F (35.6 ??C), Max:97.2 ??F (36.2 ??C)        07/21 1900 - 07/23 0659  In: 1080 [P.O.:1080]  Out: -     General:  Alert, cooperative, no distress, appears stated age.   Lungs:   Clear to auscultation bilaterally.        Heart:  Regular rate and rhythm, S1, S2 normal,      Abdomen:   Soft, non-tender. Bowel sounds normal.       Extremities:   Edema.+            Neurologic: CNII-XII intact.      Data Review:       Recent Days:  Recent Labs   [facility] 07/09/12 0156 07/08/12 0019    WBC 3.0* 3.4*    HGB 8.8* 9.7*    HCT 27.5* 30.3*    PLT 148* 183     Recent Labs   Basename 07/09/12 0156 07/08/12 0019    NA 134* 133*    K 5.3* 5.0    CL 96* 95*    CO2 30 31    GLU 81 92    BUN 42* 30*    CREA 8.13* 6.08*    CA 8.9 8.9    MG -- --    PHOS -- --    ALB -- --    TBIL -- --    SGOT -- --    INR -- --     No results found for this basename: PH:3,PCO2:3,PO2:3,HCO3:3,FIO2:3 in the last 72 hours    24 Hour Results:  Recent Results (from the past 24 hour(s))    GLUCOSE, POC    Collection Time    07/09/12 12:28 PM       Component Value Range    POC GLUCOSE 123 (*) 75 - 110 mg/dL    Performed by Levonne Hubert     GLUCOSE, POC    Collection Time    07/09/12  4:43 PM       Component Value Range    POC GLUCOSE 145 (*) 75 - 110 mg/dL    Performed by Lelon Frohlich     GLUCOSE, POC    Collection Time    07/09/12  7:34 PM  Component Value Range    POC GLUCOSE 129 (*) 75 - 110 mg/dL    Performed by Lelon Frohlich     GLUCOSE, POC    Collection Time    07/10/12  6:57 AM       Component Value Range    POC GLUCOSE 119 (*) 75 - 110 mg/dL    Performed by [facility]         Problem List:  Problem List as of 07/10/2012  Date Reviewed: July 16, 2012      Codes Class Noted - Resolved    ESRD (end stage renal disease) on dialysis (Chronic) 585.6  16-Jul-2012 - Present        HTN (hypertension) (Chronic) 401.9  07-16-2012 - Present        *Sickle cell anemia with pain (Chronic) 282.62  07/05/2012 - Present              Medications reviewed  Current Facility-Administered Medications   Medication Dose Route Frequency   ??? sodium chloride (NS) flush 5-10 mL  5-10 mL IntraVENous PRN   ??? sodium chloride (NS) flush 5-10 mL  5-10 mL IntraVENous Q8H   ??? oxyCODONE-acetaminophen (PERCOCET) 5-325 mg per tablet 1 Tab  1 Tab Oral Q6H PRN   ??? B complex-vitaminC-folic acid (NEPHROCAP) cap  1 Cap Oral DAILY   ??? calcium acetate (PHOSLO) tablet 1,334 mg  2 Tab Oral TID WITH MEALS   ??? hydroxyurea (HYDREA) chemo cap 500 mg  500 mg Oral DAILY   ??? insulin glargine (LANTUS) injection 8 Units  8 Units SubCUTAneous ACB   ??? acetaminophen (TYLENOL) tablet 650 mg  650 mg Oral Q4H PRN   ??? acetaminophen (TYLENOL) tablet 650 mg  650 mg Oral Q4H PRN   ??? ondansetron (ZOFRAN) injection 4 mg  4 mg IntraVENous Q4H PRN   ??? magnesium hydroxide (MILK OF MAGNESIA) oral suspension 30 mL  30 mL Oral DAILY PRN   ??? docusate sodium (COLACE) capsule 100 mg  100 mg Oral BID   ??? bisacodyl (DULCOLAX) suppository 10 mg  10 mg Rectal DAILY  PRN   ??? heparin (porcine) injection 5,000 Units  5,000 Units SubCUTAneous Q8H   ??? prochlorperazine (COMPAZINE) with saline injection 5 mg  5 mg IntraVENous Q6H PRN   ??? insulin lispro (HUMALOG) injection 2-3 Units  2-3 Units SubCUTAneous QHS   ??? HYDROmorphone (PF) (DILAUDID) injection 2 mg  2 mg IntraVENous Q3H PRN   ??? amLODIPine (NORVASC) tablet 10 mg  10 mg Oral DAILY   ??? lisinopril (PRINIVIL, ZESTRIL) tablet 20 mg  20 mg Oral DAILY   ??? cloNIDine (CATAPRES) tablet 0.1 mg  0.1 mg Oral Q4H PRN   ??? epoetin alfa (EPOGEN;PROCRIT) injection 10,000 Units  10,000 Units SubCUTAneous 2 Days a week   ??? albumin human 25% (BUMINATE) solution 25 g  25 g IntraVENous PRN       Francee Setzer K. Jacen Carlini, MD

## 2012-07-10 NOTE — Other (Signed)
Bedside and Verbal shift change report given to B Diedre Maclellan RN (oncoming nurse) by Houston Siren RN (offgoing nurse).  Report given with SBAR and Kardex.

## 2012-07-10 NOTE — Progress Notes (Signed)
NAME: Tyler Ballard        DOB:  July 03, 1981        MRN:  [id number]        Assessment :    Plan:  --ESRD TTS  Anemia  Sickle cell crisis   HTN   AVF   Brawny edema    --Plan for HD  now   Increase UF  He will be driving back to his dialysis unit in [place] in AM as per patient       Subjective:     Chief Complaint:  No complaints today,for dialysis  Review of Systems:    Symptom Y/N Comments  Symptom Y/N Comments   Fever/Chills    Chest Pain     Poor Appetite    Edema     Cough    Abdominal Pain     Sputum    Joint Pain     SOB/DOE    Pruritis/Rash     Nausea/vomit    Tolerating PT/OT     Diarrhea    Tolerating Diet     Constipation    Other       Could not obtain due to:      Objective:     VITALS:   Last 24hrs VS reviewed since prior progress note. Most recent are:  Visit Vitals   Item Reading   ??? BP 120/62   ??? Pulse 66   ??? Temp 96 ??F (35.6 ??C)   ??? Resp 17   ??? Ht 5\' 5"  (1.651 m)   ??? Wt 79.379 kg (175 lb)   ??? BMI 29.12 kg/m2   ??? SpO2 100%       Intake/Output Summary (Last 24 hours) at 07/10/12 1120  Last data filed at 07/09/12 2031   Gross per 24 hour   Intake    720 ml   Output      0 ml   Net    720 ml      Telemetry Reviewed:     PHYSICAL EXAM:  General:  no acute distress  2+ chronic edema      Lab Data Reviewed: (see below)    Medications Reviewed: (see below)    PMH/SH reviewed - no change compared to H&P  ________________________________________________________________________  Care Plan discussed with:  Patient     Family      RN     Care Manager                    Consultant:          Comments   >50% of visit spent in counseling and coordination of care       ________________________________________________________________________  Haislee Corso g abou-assi, MD     Procedures: see electronic medical records for all procedures/Xrays and details which  were not copied into this note but were reviewed prior to creation of Plan.      LABS:   Recent Labs   [facility] 07/09/12 0156 07/08/12 0019    WBC 3.0* 3.4*    HGB 8.8* 9.7*    HCT 27.5* 30.3*    PLT 148* 183     Recent Labs   Basename 07/09/12 0156 07/08/12 0019    NA 134* 133*    K 5.3* 5.0    CL 96* 95*    CO2 30 31    BUN 42* 30*    CREA 8.13* 6.08*    GLU 81 92    CA 8.9 8.9  MG -- --    PHOS -- --    URICA -- --     No results found for this basename: SGOT:3,GPT:3,AP:3,TBIL:3,TP:3,ALB:3,GLOB:3,GGT:3,AML:3,AMYP:3,LPSE:3,HLPSE:3 in the last 72 hours  No results found for this basename: INR:3,PTP:3,APTT:3, in the last 72 hours   No results found for this basename: FE:2,TIBC:2,PSAT:2,FERR:2, in the last 72 hours   No components found with this basename: B12,  FOL,  FOLAT,  RBCF      No results found for this basename: PH:2,PCO2:2,PO2:2, in the last 72 hours  No results found for this basename: CPK:3,CKMB:3,TROPONINI:3, in the last 72 hours  Lab Results   Component Value Date/Time    POC GLUCOSE 119 07/10/2012  6:57 AM    POC GLUCOSE 129 07/09/2012  7:34 PM    POC GLUCOSE 145 07/09/2012  4:43 PM    POC GLUCOSE 123 07/09/2012 12:28 PM    POC GLUCOSE 98 07/09/2012  7:26 AM     No results found for this basename: color,  apprn,  spgru,  refsg,  phu,  protu,  glucu,  ketu,  bilu,  urou,  nitu,  leuku,  gluke,  epsu,  bactu,  wbcu,  rbcu,  casts,  ucry       MEDICATIONS:  Current Facility-Administered Medications   Medication Dose Route Frequency   ??? HYDROmorphone (DILAUDID) tablet 2 mg  2 mg Oral Q3H PRN   ??? sodium chloride (NS) flush 5-10 mL  5-10 mL IntraVENous PRN   ??? sodium chloride (NS) flush 5-10 mL  5-10 mL IntraVENous Q8H   ??? oxyCODONE-acetaminophen (PERCOCET) 5-325 mg per tablet 1 Tab  1 Tab Oral Q6H PRN   ??? B complex-vitaminC-folic acid (NEPHROCAP) cap  1 Cap Oral DAILY   ??? calcium acetate (PHOSLO) tablet 1,334 mg  2 Tab Oral TID WITH MEALS   ??? hydroxyurea (HYDREA) chemo cap 500 mg  500 mg Oral DAILY   ??? insulin glargine (LANTUS) injection 8 Units  8 Units SubCUTAneous ACB   ??? acetaminophen  (TYLENOL) tablet 650 mg  650 mg Oral Q4H PRN   ??? acetaminophen (TYLENOL) tablet 650 mg  650 mg Oral Q4H PRN   ??? ondansetron (ZOFRAN) injection 4 mg  4 mg IntraVENous Q4H PRN   ??? magnesium hydroxide (MILK OF MAGNESIA) oral suspension 30 mL  30 mL Oral DAILY PRN   ??? docusate sodium (COLACE) capsule 100 mg  100 mg Oral BID   ??? bisacodyl (DULCOLAX) suppository 10 mg  10 mg Rectal DAILY PRN   ??? heparin (porcine) injection 5,000 Units  5,000 Units SubCUTAneous Q8H   ??? prochlorperazine (COMPAZINE) with saline injection 5 mg  5 mg IntraVENous Q6H PRN   ??? insulin lispro (HUMALOG) injection 2-3 Units  2-3 Units SubCUTAneous QHS   ??? amLODIPine (NORVASC) tablet 10 mg  10 mg Oral DAILY   ??? lisinopril (PRINIVIL, ZESTRIL) tablet 20 mg  20 mg Oral DAILY   ??? cloNIDine (CATAPRES) tablet 0.1 mg  0.1 mg Oral Q4H PRN   ??? epoetin alfa (EPOGEN;PROCRIT) injection 10,000 Units  10,000 Units SubCUTAneous 2 Days a week   ??? albumin human 25% (BUMINATE) solution 25 g  25 g IntraVENous PRN

## 2012-07-10 NOTE — Progress Notes (Signed)
[facility] [place] [facility]                         [phone]  Vitals Pre Post Assessment Pre Post   BP 191/95 188/84 LOC A&Ox3 A&Ox3   HR 70 71 Lungs clear clear   Temp 97.8 98.3 Cardiac regular regular   Resp 18 18 Skin Warn and dry Warm and dry   Weight 93.1kg 88.5kg Edema BLE +2 BLE +1      Pain No complaints of pain No complaints of pain     Orders   Duration: Start: 1126 End: 1530 Total: 4   Dialyzer: Revaclear   K Bath: 2   Ca Bath: 2.5   Na / Bicarb: 140/35   Target Fluid Removal:     Access   Type & Location: RUA AVF   Comments:                            +B/T, no s/s of infection, aseptic prep before cannulation, easily cannulated with 15g two inches apart, at the end of treatment both lines flushed with NS, needles pulled and both sites held until homeostasis reached, sites covered with gauzed and secured with tape, +B/T post treatment     Labs   Hep B status / date: 62  01/24/12   Obtained/Reviewed  Critical Results Called Labs reviewed, no critical values to report       Meds Given   Name Dose Route   None ordered                 Total Liters Process: 87.7   Net Fluid Removed: 5010      Comments   Time Out Done: Yes, 1115   Primary Nurse Rpt Pre: Doran Heater, RN   Primary Nurse Rpt Post: Doran Heater, RN   Pt Education: Yes, importance of following treatment regiment    Tx Summary: Pre report received from primary nurse, patient tolerated treatment well, at the end of treatment all possible blood returned to patient, patient left with call bell in reach, bed locked and in lowest position, post report given to primary nurse

## 2012-07-10 NOTE — Progress Notes (Signed)
Reported off to [facility] RN; patient not in room to take set of vitals and give blood pressure medications that were held this a.m. Due to dialysis; dialysis nurse reported that patient had left floor to smoke.

## 2012-11-29 LAB — HEPATITIS C ANTIBODY: HCV Ab: NONREACTIVE

## 2012-11-29 LAB — HEPATITIS B CORE AB, IGM
Hep B Core Ab, IgM: NONREACTIVE
Hepatitis B core, IgM: NONREACTIVE

## 2012-11-29 LAB — HEPATITIS B SURFACE ANTIBODY
Hep B S Ab Interp: REACTIVE
Hep B S Ab: 1.55 Index (ref >1.00)

## 2012-11-29 LAB — METABOLIC PANEL, BASIC
Anion gap: 16 mmol/L (ref 10–17)
BUN/Creatinine ratio: 5 — ABNORMAL LOW (ref 6.0–20.0)
BUN: 46 MG/DL — ABNORMAL HIGH (ref 7–18)
CO2: 26 MMOL/L (ref 21–32)
Calcium: 8.5 MG/DL (ref 8.5–10.1)
Chloride: 99 MMOL/L (ref 98–107)
Creatinine: 9.6 MG/DL — CR (ref 0.6–1.3)
GFR est AA: 8 mL/min/{1.73_m2} — ABNORMAL LOW (ref >60)
GFR est non-AA: 7 mL/min/{1.73_m2} — ABNORMAL LOW (ref >60)
Glucose: 97 MG/DL (ref 74–106)
Potassium: 4.9 MMOL/L (ref 3.50–5.10)
Sodium: 136 MMOL/L (ref 136–145)

## 2012-11-29 LAB — CBC WITH AUTOMATED DIFF
ABS. BASOPHILS: 0 10*3/uL (ref 0.0–0.2)
ABS. EOSINOPHILS: 0.1 10*3/uL (ref 0.0–0.7)
ABS. LYMPHOCYTES: 0.7 10*3/uL — ABNORMAL LOW (ref 1.2–3.4)
ABS. MONOCYTES: 0.4 10*3/uL — ABNORMAL LOW (ref 1.1–3.2)
ABS. NEUTROPHILS: 3.6 10*3/uL (ref 1.4–6.5)
BASOPHILS: 0 % (ref 0–2)
EOSINOPHILS: 2 % (ref 0–5)
HCT: 21.5 % — ABNORMAL LOW (ref 36.8–45.2)
HGB: 7.2 g/dL — ABNORMAL LOW (ref 12.8–15.0)
IMMATURE GRANULOCYTES: 0.2 % (ref 0.0–5.0)
LYMPHOCYTES: 15 % — ABNORMAL LOW (ref 16–40)
MCH: 31.6 PG — ABNORMAL HIGH (ref 27–31)
MCHC: 33.5 g/dL (ref 32–36)
MCV: 94.3 FL (ref 81–99)
MONOCYTES: 8 % (ref 0–12)
MPV: 9 FL (ref 7.4–10.4)
NEUTROPHILS: 75 % — ABNORMAL HIGH (ref 40–70)
PLATELET: 127 10*3/uL — ABNORMAL LOW (ref 140–450)
RBC: 2.28 M/uL — ABNORMAL LOW (ref 4.0–5.2)
RDW: 16 % — ABNORMAL HIGH (ref 11.5–14.5)
WBC: 4.8 10*3/uL (ref 4.8–10.8)

## 2012-11-29 LAB — PTT
aPTT: 43.3 s — ABNORMAL HIGH (ref 24.5–31.6)
aPTT: 46 s — ABNORMAL HIGH (ref 24.5–31.6)

## 2012-11-29 LAB — CARDIAC BATTERY WITH BNP
BNP: 328 pg/mL — ABNORMAL HIGH (ref 5–100)
CK-MB: 1.9 ng/mL (ref <4.3)
Myoglobin: 293 ng/mL — ABNORMAL HIGH (ref <107)
Troponin-I: <0.05 ng/mL (ref <0.05)

## 2012-11-29 LAB — LIPID PANEL
CHOL/HDL Ratio: 2.6
Cholesterol, total: 85 MG/DL (ref <200)
HDL Cholesterol: 33 MG/DL — ABNORMAL LOW (ref 40–60)
LDL, calculated: 41.2 MG/DL (ref 0–130)
LDL/HDL Ratio: 1.2
Triglyceride: 54 MG/DL (ref 0–200)
VLDL, calculated: 10.8 MG/DL

## 2012-11-29 LAB — HEP B SURFACE AB
Hep B surface Ab Interp.: REACTIVE
Hepatitis B surface Ab: 1.55 Index (ref >1.00)

## 2012-11-29 LAB — HEPATIC FUNCTION PANEL
A-G Ratio: 0.5 — ABNORMAL LOW (ref 1.0–3.1)
A-G Ratio: 0.5 — ABNORMAL LOW (ref 1.0–3.1)
ALT (SGPT): 16 U/L (ref 12.0–78.0)
ALT (SGPT): 15 U/L (ref 12.0–78.0)
AST (SGOT): 17 U/L (ref 15–37)
AST (SGOT): 17 U/L (ref 15–37)
Albumin: 3.2 g/dL — ABNORMAL LOW (ref 3.40–5.00)
Albumin: 3 g/dL — ABNORMAL LOW (ref 3.40–5.00)
Alk. phosphatase: 107 U/L (ref 50–136)
Alk. phosphatase: 112 U/L (ref 50–136)
Bilirubin, direct: 0.1 MG/DL (ref 0.0–0.20)
Bilirubin, direct: 0.1 MG/DL (ref 0.0–0.20)
Bilirubin, total: 0.4 MG/DL (ref 0.20–1.00)
Bilirubin, total: 0.4 MG/DL (ref 0.20–1.00)
Globulin: 7 g/dL
Globulin: 6.6 g/dL
Protein, total: 10.2 g/dL — ABNORMAL HIGH (ref 6.40–8.20)
Protein, total: 9.6 g/dL — ABNORMAL HIGH (ref 6.40–8.20)

## 2012-11-29 LAB — PROTHROMBIN TIME + INR
INR: 3.1
INR: 3.1
Prothrombin time: 32.5 s — ABNORMAL HIGH (ref 9.8–12.0)
Prothrombin time: 31.9 s — ABNORMAL HIGH (ref 9.8–12.0)

## 2012-11-29 LAB — HEP B SURFACE AG: Hep B Surface Ag: NONREACTIVE

## 2012-11-29 LAB — CK: CK: 153 U/L (ref 26–308)

## 2012-11-29 LAB — AMYLASE: Amylase: 108 U/L (ref 25–115)

## 2012-11-29 LAB — HGB & HCT
HCT: 21.3 % — ABNORMAL LOW (ref 36.8–45.2)
HGB: 6.9 g/dL — ABNORMAL LOW (ref 12.8–15.0)

## 2012-11-29 LAB — LIPASE: Lipase: 187 U/L (ref 65–230)

## 2012-11-29 LAB — MAGNESIUM: Magnesium: 2 MG/DL (ref 1.8–2.4)

## 2012-11-29 LAB — FOLATE: Folate: 42.3 ng/mL — ABNORMAL HIGH (ref 3.1–12.4)

## 2012-11-29 LAB — RETICULOCYTE COUNT
Reticulocyte count: 1.1 % (ref 0.5–1.5)
Reticulocyte count: 1.5 % (ref 0.5–1.5)

## 2012-11-29 LAB — HCV AB: Hepatitis C virus Ab: NONREACTIVE

## 2012-11-29 LAB — PHOSPHORUS: Phosphorus: 7.1 MG/DL — ABNORMAL HIGH (ref 2.50–4.90)

## 2012-11-29 LAB — URIC ACID: Uric acid: 3.8 MG/DL (ref 2.60–7.20)

## 2012-11-29 LAB — HEMOGLOBIN A1C WITH EAG: Hemoglobin A1c: 5.3 % (ref 4.4–6.4)

## 2012-11-29 NOTE — H&P (Unsigned)
when he could no longer any substances down. The patient does mention a  past medical history significant for hypertension and sickle cell disease.  Because of the ongoing joint pain and the persistent tachycardia and  dehydration it was determined that the patient would be admitted for  further evaluation and management.    PAST MEDICAL HISTORY  1. Hypertension.  2. Sickle cell disease.  3. Obesity.  4. Tobacco abuse.    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    FAMILY HISTORY:  Includes hypertension and diabetes.    SOCIAL HISTORY:  The patient is currently single. He has no children. He is  unemployed. He does continue to engage in the use of tobacco products,  smoking approximately a half pack of cigarettes a day. He does deny any  illicit drugs or alcohol abuse.    CODE STATUS:  FULL CODE.    ALLERGIES  1. MORPHINE.  2. FENTANYL.    MEDICATIONS ON ADMISSION  1. Hydroxyurea 500 mg p.o. b.i.d.  2. Folic acid 1 mg p.o. daily.  3. Lisinopril, unknown dose.  4. Dilaudid 8 mg p.o. q.4 hours p.r.n. for pain.  5. Phenergan 25 mg p.o. q.6 hours p.r.n. for nausea.  6. Benadryl 50 mg with the Dilaudid p.o. q.6 p.r.n.  7. Norvasc 5 mg p.o. daily.    REVIEW OF SYSTEMS  GENERAL:  The patient does make mention of some moderate weight gain  secondary to fluid retention bilateral lower extremities. Denies any  fatigue, weakness, fever, chills, or night sweats.  HEAD:  The patient denies any head trauma or any visual changes.  CARDIAC:  The patient does have a history of hypertension. He denies any  chest pain, palpitations, dyspnea on exertion. No paroxysmal nocturnal  dyspnea. Again, the patient denied an shortness of breath despite his  notable hypoxia on arrival.  RESPIRATORY:  Again, the patient denies any shortness of breath, any  wheezing, coughing, or hemoptysis, or recent respiratory tract infection.  GASTROINTESTINAL:  The patient denies any change in appetite,  however, he  does mention a recent episode of nausea, vomiting. Denies any indigestion,  dyspepsia, diarrhea, constipation, or hematochezia.  URINARY:  The patient denies any urinary frequency, hesitancy, polyuria, or  polydipsia.  MUSCULOSKELETAL:  The patient makes mention of muscle tenderness and joint  pain, and also again bilateral lower extremity swelling.  NEUROLOGIC:  The patient denies any loss of sensation, numbness, tingling,  tremors, paralysis, or seizures.    PHYSICAL EXAMINATION  GENERAL:  This is a 31-year-old African American male who is awake, alert  and oriented for 2 to 3 years, notably drowsy secondary to administration  of pain medicine while in the emergency department. However, he is  redirectable. He is able to participate in the medical decision making  process. He appears in no acute distress.  VITAL SIGNS:  Blood pressure 147/81, heart rate 116, respirations 18,  oxygen saturation 100% on 3 L nasal cannula, temperature 98.9.  HEENT:  Head is normocephalic. Pupils are equal at 2 mm but sluggish to  light and accommodation. EOMs are intact. There is no scleral icterus.  Conjunctivae are pale. Nostrils and ears are without discharge.  NECK:  Soft, supple, no JVD. No carotid bruits. Trachea in midline. No  thyromegaly.  CHEST:  The patient does have the tunneled triple-lumen catheter noted in  the right subclavian. The dressing is intact. It does appear to be old. The    patient states that this venous access has been in place for over a year.  There is no redness or swelling noted at the insertion site. Chest  symmetrical. The patient is again notably obese. S1, S2, no murmurs or rubs  appreciated.  LUNGS:  Diminished bilaterally. No wheezes or rhonchi noted.  ABDOMEN:  Obese, soft, nontender, nondistended, decreased bowel sounds. No  guarding or rigidity.  EXTREMITIES:  Warm, dry. The patient does have 3+ bilateral lower extremity  edema. Notable decreased strength bilaterally, 3/5. Pulses  were not  palpable on inspection. The patient does have significant tenderness from  the lower distal portion of his feet all the way to the upper thigh region.  Again, consistent with joint discomfort and sickle cell crisis.    LABORATORY FINDINGS:  On the day of admission sodium 132, potassium 4.9,  chloride 99, CO2 of 26, BUN 46, glucose 97, creatinine 9.60, anion gap 16,  calcium 8.5, phosphorus 7.1, magnesium 2.0, amylase 1.8, lipase 187.  Myoglobin 293. Troponin less than 0.05. CK-MB 1.9. BNP 328. Complete blood  count: WBC 4.8, hemoglobin 7.2, hematocrit 21.5, platelets 127. Coagulation  studies: PTT 43.3, INR 3.1.    EKG shows sinus tachycardia, possible anterior infarct of undetermined age,  rate of 136.    Chest x-ray pending at the time of this dictation.    IMPRESSION  1. Sickle cell crisis.  2. 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  3. Thrombocytopenia.  4. Acute on chronic renal failure.  5. Dehydration.  6. Hypertension.  7. Obesity.  8. Tobacco abuse.    PLAN:  The patient will be admitted to telemetry and for further  monitoring. The patient will be continued on low flow oxygen with a plan to  wean oxygen as tolerated. The patient will be continued on his routine  outpatient medications. Will gently rehydrate the patient with normal  saline 150 mL/hour for 2 L. Will also continue to provide aggressive pain  management with IV Dilaudid with a plan to transition to p.o. as soon as  possible. The patient will be continue on a renal dialysis diet given his  elevated renal function. The patient will have repeat labs in the a.m. to  include a BNP, CBC, magnesium level, phosphorus level, haptoglobin,  reticulocyte count, and a folic acid level. Dr. Modi, staff Nephrologist,  has been contacted regarding the patient's elevated renal enzymes and has  agreed to evaluate the patient during his inpatient admission.    Dr. Hari Devkota is the attending physician was contacted regarding this  admission to which  he accepted.        Khristine Verno PA        Dictated by: [Kayann Maj, P.A. Muntaha Vermette]  Job#: [[id number]]  DD: [11/29/2012 01:58 A]  DT: [11/29/2012 06:28 A]  Transcribed by: [[email or web address]]    cc:            Hari Devkota                 Eligh Rybacki, P.A. Shoshana Johal

## 2012-11-29 NOTE — H&P (Unsigned)
when he could no longer any substances down. The patient does mention a  past medical history significant for hypertension and sickle cell disease.  Because of the ongoing joint pain and the persistent tachycardia and  dehydration it was determined that the patient would be admitted for  further evaluation and management.    PAST MEDICAL HISTORY  1. Hypertension.  2. Sickle cell disease.  3. Obesity.  4. Tobacco abuse.    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    FAMILY HISTORY:  Includes hypertension and diabetes.    SOCIAL HISTORY:  The patient is currently single. He has no children. He is  unemployed. He does continue to engage in the use of tobacco products,  smoking approximately a half pack of cigarettes a day. He does deny any  illicit drugs or alcohol abuse.    CODE STATUS:  FULL CODE.    ALLERGIES  1. MORPHINE.  2. FENTANYL.    MEDICATIONS ON ADMISSION  1. Hydroxyurea 500 mg p.o. b.i.d.  2. Folic acid 1 mg p.o. daily.  3. Lisinopril, unknown dose.  4. Dilaudid 8 mg p.o. q.4 hours p.r.n. for pain.  5. Phenergan 25 mg p.o. q.6 hours p.r.n. for nausea.  6. Benadryl 50 mg with the Dilaudid p.o. q.6 p.r.n.  7. Norvasc 5 mg p.o. daily.    REVIEW OF SYSTEMS  GENERAL:  The patient does make mention of some moderate weight gain  secondary to fluid retention bilateral lower extremities. Denies any  fatigue, weakness, fever, chills, or night sweats.  HEAD:  The patient denies any head trauma or any visual changes.  CARDIAC:  The patient does have a history of hypertension. He denies any  chest pain, palpitations, dyspnea on exertion. No paroxysmal nocturnal  dyspnea. Again, the patient denied an shortness of breath despite his  notable hypoxia on arrival.  RESPIRATORY:  Again, the patient denies any shortness of breath, any  wheezing, coughing, or hemoptysis, or recent respiratory tract infection.  GASTROINTESTINAL:  The patient denies any change in appetite,  however, he  does mention a recent episode of nausea, vomiting. Denies any indigestion,  dyspepsia, diarrhea, constipation, or hematochezia.  URINARY:  The patient denies any urinary frequency, hesitancy, polyuria, or  polydipsia.  MUSCULOSKELETAL:  The patient makes mention of muscle tenderness and joint  pain, and also again bilateral lower extremity swelling.  NEUROLOGIC:  The patient denies any loss of sensation, numbness, tingling,  tremors, paralysis, or seizures.    PHYSICAL EXAMINATION  GENERAL:  This is a 31 year old [place] American male who is awake, alert  and oriented for 2 to 3 years, notably drowsy secondary to administration  of pain medicine while in the emergency department. However, he is  redirectable. He is able to participate in the medical decision making  process. He appears in no acute distress.  VITAL SIGNS:  Blood pressure 147/81, heart rate 116, respirations 18,  oxygen saturation 100% on 3 L nasal cannula, temperature 98.9.  HEENT:  Head is normocephalic. Pupils are equal at 2 mm but sluggish to  light and accommodation. EOMs are intact. There is no scleral icterus.  Conjunctivae are pale. Nostrils and ears are without discharge.  NECK:  Soft, supple, no JVD. No carotid bruits. Trachea in midline. No  thyromegaly.  CHEST:  The patient does have the tunneled triple-lumen catheter noted in  the right subclavian. The dressing is intact. It does appear to be old. The  patient states that this venous access has been in place for over a year.  There is no redness or swelling noted at the insertion site. Chest  symmetrical. The patient is again notably obese. S1, S2, no murmurs or rubs  appreciated.  LUNGS:  Diminished bilaterally. No wheezes or rhonchi noted.  ABDOMEN:  Obese, soft, nontender, nondistended, decreased bowel sounds. No  guarding or rigidity.  EXTREMITIES:  Warm, dry. The patient does have 3+ bilateral lower extremity  edema. Notable decreased strength bilaterally, 3/5. Pulses  were not  palpable on inspection. The patient does have significant tenderness from  the lower distal portion of his feet all the way to the upper thigh region.  Again, consistent with joint discomfort and sickle cell crisis.    LABORATORY FINDINGS:  On the day of admission sodium 132, potassium 4.9,  chloride 99, CO2 of 26, BUN 46, glucose 97, creatinine 9.60, anion gap 16,  calcium 8.5, phosphorus 7.1, magnesium 2.0, amylase 1.8, lipase 187.  Myoglobin 293. Troponin less than 0.05. CK-MB 1.9. BNP 328. Complete blood  count: WBC 4.8, hemoglobin 7.2, hematocrit 21.5, platelets 127. Coagulation  studies: PTT 43.3, INR 3.1.    EKG shows sinus tachycardia, possible anterior infarct of undetermined age,  rate of 136.    Chest x-ray pending at the time of this dictation.    IMPRESSION  1. Sickle cell crisis.  2. 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  3. Thrombocytopenia.  4. Acute on chronic renal failure.  5. Dehydration.  6. Hypertension.  7. Obesity.  8. Tobacco abuse.    PLAN:  The patient will be admitted to telemetry and for further  monitoring. The patient will be continued on low flow oxygen with a plan to  wean oxygen as tolerated. The patient will be continued on his routine  outpatient medications. Will gently rehydrate the patient with normal  saline 150 mL/hour for 2 L. Will also continue to provide aggressive pain  management with IV Dilaudid with a plan to transition to p.o. as soon as  possible. The patient will be continue on a renal dialysis diet given his  elevated renal function. The patient will have repeat labs in the a.m. to  include a BNP, CBC, magnesium level, phosphorus level, haptoglobin,  reticulocyte count, and a folic acid level. Dr. Tildon Husky, staff Nephrologist,  has been contacted regarding the patient's elevated renal enzymes and has  agreed to evaluate the patient during his inpatient admission.    Dr. Radene Gunning is the attending physician was contacted regarding this  admission to which  he accepted.        Mayo Ao PA        Dictated by: Shonna Chock. Dianara Smullen]  Job#: [[id number]]  DD: [11/29/2012 01:58 A]  DT: [11/29/2012 06:28 A]  Transcribed by: [[email or web address]]    cc:            Melvenia Beam, P.A. Yetta Barre

## 2012-11-30 LAB — TYPE AND SCREEN
ABO/Rh: B POS
Antibody Screen: NEGATIVE
Unit Divison: 0
Unit Divison: 0

## 2012-11-30 LAB — D-DIMER, QUANTITATIVE: D DIMER,DDMR: 968 NG/ML — ABNORMAL HIGH (ref <400)

## 2012-11-30 LAB — CBC WITH AUTOMATED DIFF
ABS. BASOPHILS: 0 10*3/uL (ref 0.0–0.2)
ABS. EOSINOPHILS: 0.3 10*3/uL (ref 0.0–0.7)
ABS. LYMPHOCYTES: 1.1 10*3/uL — ABNORMAL LOW (ref 1.2–3.4)
ABS. MONOCYTES: 0.5 10*3/uL — ABNORMAL LOW (ref 1.1–3.2)
ABS. NEUTROPHILS: 3.1 10*3/uL (ref 1.4–6.5)
BASOPHILS: 1 % (ref 0–2)
EOSINOPHILS: 6 % — ABNORMAL HIGH (ref 0–5)
HCT: 22 % — ABNORMAL LOW (ref 36.8–45.2)
HGB: 7.2 g/dL — ABNORMAL LOW (ref 12.8–15.0)
IMMATURE GRANULOCYTES: 0.2 % (ref 0.0–5.0)
LYMPHOCYTES: 21 % (ref 16–40)
MCH: 30.8 PG (ref 27–31)
MCHC: 32.7 g/dL (ref 32–36)
MCV: 94 FL (ref 81–99)
MONOCYTES: 10 % (ref 0–12)
MPV: 10.1 FL (ref 7.4–10.4)
NEUTROPHILS: 62 % (ref 40–70)
PLATELET: 120 10*3/uL — ABNORMAL LOW (ref 140–450)
RBC: 2.34 M/uL — ABNORMAL LOW (ref 4.0–5.2)
RDW: 15.8 % — ABNORMAL HIGH (ref 11.5–14.5)
WBC: 5 10*3/uL (ref 4.8–10.8)

## 2012-11-30 LAB — TYPE & SCREEN
ABO/Rh(D): B POS
Antibody screen: NEGATIVE
Unit division: 0
Unit division: 0

## 2012-11-30 LAB — METABOLIC PANEL, COMPREHENSIVE
A-G Ratio: 0.4 — ABNORMAL LOW (ref 1.0–3.1)
ALT (SGPT): 14 U/L (ref 12.0–78.0)
AST (SGOT): 19 U/L (ref 15–37)
Albumin: 2.8 g/dL — ABNORMAL LOW (ref 3.40–5.00)
Alk. phosphatase: 96 U/L (ref 50–136)
Anion gap: 18 mmol/L — ABNORMAL HIGH (ref 10–17)
BUN/Creatinine ratio: 5 — ABNORMAL LOW (ref 6.0–20.0)
BUN: 37 MG/DL — ABNORMAL HIGH (ref 7–18)
Bilirubin, total: 0.5 MG/DL (ref 0.20–1.00)
CO2: 24 MMOL/L (ref 21–32)
Calcium: 8.3 MG/DL — ABNORMAL LOW (ref 8.5–10.1)
Chloride: 100 MMOL/L (ref 98–107)
Creatinine: 8.1 MG/DL — CR (ref 0.6–1.3)
GFR est AA: 10 mL/min/{1.73_m2} — ABNORMAL LOW (ref >60)
GFR est non-AA: 8 mL/min/{1.73_m2} — ABNORMAL LOW (ref >60)
Globulin: 6.6 g/dL
Glucose: 63 MG/DL — ABNORMAL LOW (ref 74–106)
Potassium: 4.8 MMOL/L (ref 3.50–5.10)
Protein, total: 9.4 g/dL — ABNORMAL HIGH (ref 6.40–8.20)
Sodium: 137 MMOL/L (ref 136–145)

## 2012-11-30 LAB — HAPTOGLOBIN: Haptoglobin: 83 mg/dL (ref 34–200)

## 2012-11-30 LAB — D DIMER: D-dimer: 968 NG/ML — ABNORMAL HIGH (ref <400)

## 2012-11-30 LAB — PHOSPHORUS: Phosphorus: 5.2 MG/DL — ABNORMAL HIGH (ref 2.50–4.90)

## 2012-11-30 LAB — RETICULOCYTE COUNT: Reticulocyte count: 1.7 % — ABNORMAL HIGH (ref 0.5–1.5)

## 2012-11-30 LAB — MAGNESIUM: Magnesium: 1.9 MG/DL (ref 1.8–2.4)

## 2012-11-30 LAB — PROTHROMBIN TIME + INR
INR: 2.7
Prothrombin time: 28.1 s — ABNORMAL HIGH (ref 9.8–12.0)

## 2012-12-01 LAB — CBC WITH AUTOMATED DIFF
ABS. BASOPHILS: 0 10*3/uL (ref 0.0–0.2)
ABS. EOSINOPHILS: 0.2 10*3/uL (ref 0.0–0.7)
ABS. LYMPHOCYTES: 0.6 10*3/uL — ABNORMAL LOW (ref 1.2–3.4)
ABS. MONOCYTES: 0.4 10*3/uL — ABNORMAL LOW (ref 1.1–3.2)
ABS. NEUTROPHILS: 1.5 10*3/uL (ref 1.4–6.5)
BASOPHILS: 1 % (ref 0–2)
EOSINOPHILS: 7 % — ABNORMAL HIGH (ref 0–5)
HCT: 20.2 % — ABNORMAL LOW (ref 36.8–45.2)
HGB: 6.8 g/dL — ABNORMAL LOW (ref 12.8–15.0)
IMMATURE GRANULOCYTES: 0.4 % (ref 0.0–5.0)
LYMPHOCYTES: 22 % (ref 16–40)
MCH: 31.5 PG — ABNORMAL HIGH (ref 27–31)
MCHC: 33.7 g/dL (ref 32–36)
MCV: 93.5 FL (ref 81–99)
MONOCYTES: 13 % — ABNORMAL HIGH (ref 0–12)
MPV: 10.2 FL (ref 7.4–10.4)
NEUTROPHILS: 57 % (ref 40–70)
PLATELET: 129 10*3/uL — ABNORMAL LOW (ref 140–450)
RBC: 2.16 M/uL — ABNORMAL LOW (ref 4.0–5.2)
RDW: 15.6 % — ABNORMAL HIGH (ref 11.5–14.5)
WBC: 2.7 10*3/uL — ABNORMAL LOW (ref 4.8–10.8)

## 2012-12-01 LAB — METABOLIC PANEL, BASIC
Anion gap: 13 mmol/L (ref 10–17)
BUN/Creatinine ratio: 4 — ABNORMAL LOW (ref 6.0–20.0)
BUN: 25 MG/DL — ABNORMAL HIGH (ref 7–18)
CO2: 30 MMOL/L (ref 21–32)
Calcium: 8.3 MG/DL — ABNORMAL LOW (ref 8.5–10.1)
Chloride: 99 MMOL/L (ref 98–107)
Creatinine: 6.1 MG/DL — CR (ref 0.6–1.3)
GFR est AA: 14 mL/min/{1.73_m2} — ABNORMAL LOW (ref >60)
GFR est non-AA: 11 mL/min/{1.73_m2} — ABNORMAL LOW (ref >60)
Glucose: 102 MG/DL (ref 74–106)
Potassium: 3.7 MMOL/L (ref 3.50–5.10)
Sodium: 138 MMOL/L (ref 136–145)

## 2012-12-01 LAB — HAPTOGLOBIN: Haptoglobin: 107 mg/dL (ref 34–200)

## 2012-12-01 LAB — PHOSPHORUS: Phosphorus: 3.2 MG/DL (ref 2.50–4.90)

## 2012-12-01 NOTE — Consults (Signed)
DATE OF ADMISSION: 11/28/2012    DATE OF CONSULTATION: 11/29/2012    REQUESTING PHYSICIAN: ER physician on behalf of Dr. Jerline Pain.    ROOM NUMBER: [id number].    REASON FOR CONSULTATION: Semi-urgent evaluation, and management of renal  failure and increasing shortness of breath.    HISTORY OF PRESENT ILLNESS: The patient is a 31 year old young African  American male patient with multiple medical morbidities including sickle  cell disease, hypertension, and severe renal failure. He has moved from  [place] to [facility] area. He has been admitted via ER for  increasing cough, shortness of breath and orthopnea. The patient has poor  appetite, some nausea, but no vomiting. He denies any diarrhea.    As per patient, he has not dialyzed for more than 3-4 months. Prior to  that, he had dialysis in [place], but it was discontinued.    PAST MEDICAL HISTORY  1. Sickle cell disease for many years.  2. Systemic hypertension.  3. Obesity.  4. Tobacco abuse.  5. Substance abuse, but not active problem as per patient.  6. 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    MEDICATION ALLERGIES  1. MORPHINE.  2. FENTANYL.    MEDICATIONS INCLUDED  1. Lisinopril. The patient did not dose.  2. Hydroxyurea 500 mg b.i.d.  3. Folic acid 1 mg p.o. daily.  4. Dilaudid 8 mg p.o. q.4h. p.r.n. for pain.  5. Benadryl.  6. Norvasc 5 mg daily.    PERSONAL AND SOCIAL HISTORY: The patient is single. He is unemployed. He  has no children. He smokes cigarettes, half pack per day. He denies any  active use of illicit drugs or alcohol abuse.    FAMILY HISTORY: Both parents have sickle cell trait. The patient has 2  sisters. One sister has sickle cell disease. Strong family history of  hypertension and diabetes. No family history of end-stage renal disease.    REVIEW OF SYSTEMS: The patient has been quite dyspneic and in some  respiratory distress. He was not very cooperative for detailed history, but  he did have some tingling,  numbness, as well as nocturia 3 to 4 times. He  does have some chest discomfort, too.    PHYSICAL EXAMINATION  GENERAL: The patient is awake. He is in some respiratory distress.  VITAL SIGNS: He is 5 feet 5 inches tall and weighs 95.2 kg. His temperature  is 98, pulse 106 per minute, blood pressure 177/104 mmHg.  HEENT: He has puffiness of face, marked pallor of mucous membrane. No  jaundice.  NECK: Veins engorged. No cervical lymphadenopathy.  CHEST: Bilateral lower zone crepitations.  CARDIAC: S1, S2, with grade 3/6 midsystolic murmur. He has a right-sided  Port-A-Cath under right clavicle.  ABDOMEN: Mildly obese, soft, nontender.  EXTREMITIES: 3 to 4+ edema. Few tattoos on right upper extremity. Right  upper arm AV fistula is patent. He has more swelling of right upper  extremity than left upper extremity.  (I feel this may be due to some central venous occlusion related to  Port-A-Cath and AV fistula on the right side).    LABORATORY DATA: Hemoglobin 7.2, hematocrit 21.5, WBC 4.8, platelets 127.  INR 3.1. Sodium 132, potassium 4.9, CO2 of 26, BUN 46, creatinine 9.6,  anion gap 16, calcium 8.5, phosphorus 7.1. Amylase normal, lipase normal.  Myoglobin 293. Troponin less than 0.05.    EKG shows sinus tachycardia, very small R wave in anterior chest lead,  possibility of anterior MI cannot be ruled  out, old anterior MI could not  be ruled out, age undetermined.    Chest x-ray: Chronic interstitial changes in lungs, mild cardiomegaly and  some pulmonary congestion.    ASSESSMENT AND PLAN  1. Chronic kidney disease stage V. 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 I suggested Child psychotherapist to get  medical records from [facility]. I am surprised to see that he has no  medical insurance, which may not be true. I think we need to restart  dialysis today. I discussed this issue with the patient and he has agreed.  2. Anemia. His anemia seems very likely due to sickle cell  disease, as well  as chronic kidney disease. He may have some underlying iron deficiency, but  it is less likely.  3. Systemic hypertension and anasarca. These are related to excessive fluid  retention due to severe chronic kidney disease. Hopefully, it will improve  with progressive fluid removal on probably daily dialysis.  4. Hyperphosphatemia. I have started PhosLo 667 mg p.o. t.i.d. a.c. I  ordered Lasix 80 mg IV b.i.d. and metolazone 10 mg p.o. once daily. I also  started Nephro-Vite 1 daily.  5. For anemia, I will also start Procrit 20,000 units IV during dialysis. I  also requested social worker to arrange outpatient hemodialysis at [facility].    I will continue his renal care.        Electronically viewed and signed by Janeece Agee, MD on 12/28/2012 11:28  A.        Dictated by: Sherlyn Lees, M.D.]  Job#: [[id number]]  DD: [12/01/2012 11:37 A]  DT: [12/01/2012 09:26 P]  Transcribed by: [[email or web address]]    cc:            Inocencio Homes, M.D.

## 2012-12-02 LAB — PROTHROMBIN TIME + INR
INR: 1.4
Prothrombin time: 14.6 s — ABNORMAL HIGH (ref 9.8–12.0)

## 2012-12-02 LAB — CBC WITH AUTOMATED DIFF
ABS. BASOPHILS: 0.1 10*3/uL (ref 0.0–0.2)
ABS. EOSINOPHILS: 0.2 10*3/uL (ref 0.0–0.7)
ABS. LYMPHOCYTES: 0.9 10*3/uL — ABNORMAL LOW (ref 1.2–3.4)
ABS. MONOCYTES: 0.5 10*3/uL — ABNORMAL LOW (ref 1.1–3.2)
ABS. NEUTROPHILS: 1.5 10*3/uL (ref 1.4–6.5)
BASOPHILS: 2 % (ref 0–2)
EOSINOPHILS: 7 % — ABNORMAL HIGH (ref 0–5)
HCT: 21 % — ABNORMAL LOW (ref 36.8–45.2)
HGB: 6.9 g/dL — ABNORMAL LOW (ref 12.8–15.0)
IMMATURE GRANULOCYTES: 0.3 % (ref 0.0–5.0)
LYMPHOCYTES: 29 % (ref 16–40)
MCH: 31.2 PG — ABNORMAL HIGH (ref 27–31)
MCHC: 32.9 g/dL (ref 32–36)
MCV: 95 FL (ref 81–99)
MONOCYTES: 15 % — ABNORMAL HIGH (ref 0–12)
MPV: 9.9 FL (ref 7.4–10.4)
NEUTROPHILS: 47 % (ref 40–70)
NRBC: 0 PER 100 WBC
PLATELET: 140 10*3/uL (ref 140–450)
RBC: 2.21 M/uL — ABNORMAL LOW (ref 4.0–5.2)
RDW: 15.7 % — ABNORMAL HIGH (ref 11.5–14.5)
WBC: 3.1 10*3/uL — ABNORMAL LOW (ref 4.8–10.8)

## 2012-12-02 LAB — METABOLIC PANEL, BASIC
Anion gap: 16 mmol/L (ref 10–17)
BUN/Creatinine ratio: 3 — ABNORMAL LOW (ref 6.0–20.0)
BUN: 20 MG/DL — ABNORMAL HIGH (ref 7–18)
CO2: 29 MMOL/L (ref 21–32)
Calcium: 8.4 MG/DL — ABNORMAL LOW (ref 8.5–10.1)
Chloride: 95 MMOL/L — ABNORMAL LOW (ref 98–107)
Creatinine: 5.9 MG/DL — CR (ref 0.6–1.3)
GFR est AA: 14 mL/min/{1.73_m2} — ABNORMAL LOW (ref >60)
GFR est non-AA: 12 mL/min/{1.73_m2} — ABNORMAL LOW (ref >60)
Glucose: 78 MG/DL (ref 74–106)
Potassium: 3.7 MMOL/L (ref 3.50–5.10)
Sodium: 136 MMOL/L (ref 136–145)

## 2012-12-02 LAB — PTT: aPTT: 32.7 s — ABNORMAL HIGH (ref 24.5–31.6)

## 2012-12-02 LAB — PHOSPHORUS: Phosphorus: 3.5 MG/DL (ref 2.50–4.90)

## 2012-12-03 LAB — CBC WITH AUTOMATED DIFF
ABS. BASOPHILS: 0 10*3/uL (ref 0.0–0.2)
ABS. EOSINOPHILS: 0.2 10*3/uL (ref 0.0–0.7)
ABS. IMM. GRANS.: 0 10*3/uL (ref 0.0–0.5)
ABS. LYMPHOCYTES: 0.8 10*3/uL — ABNORMAL LOW (ref 1.2–3.4)
ABS. MONOCYTES: 0.5 10*3/uL — ABNORMAL LOW (ref 1.1–3.2)
ABS. NEUTROPHILS: 1.1 10*3/uL — ABNORMAL LOW (ref 1.4–6.5)
BASOPHILS: 1 % (ref 0–2)
EOSINOPHILS: 7 % — ABNORMAL HIGH (ref 0–5)
HCT: 22 % — ABNORMAL LOW (ref 36.8–45.2)
HGB: 6.7 g/dL — ABNORMAL LOW (ref 12.8–15.0)
IMMATURE GRANULOCYTES: 0.4 % (ref 0.0–5.0)
LYMPHOCYTES: 29 % (ref 16–40)
MCH: 29.8 PG (ref 27–31)
MCHC: 30.5 g/dL — ABNORMAL LOW (ref 32–36)
MCV: 97.8 FL (ref 81–99)
MONOCYTES: 20 % — ABNORMAL HIGH (ref 0–12)
MPV: 10.4 FL (ref 7.4–10.4)
NEUTROPHILS: 43 % (ref 40–70)
PLATELET: 152 10*3/uL (ref 140–450)
RBC: 2.25 M/uL — ABNORMAL LOW (ref 4.0–5.2)
RDW: 15.3 % — ABNORMAL HIGH (ref 11.5–14.5)
WBC: 2.7 10*3/uL — ABNORMAL LOW (ref 4.8–10.0)

## 2012-12-03 LAB — METABOLIC PANEL, COMPREHENSIVE
A-G Ratio: 0.4 — ABNORMAL LOW (ref 1.0–3.1)
ALT (SGPT): 17 U/L (ref 12.0–78.0)
AST (SGOT): 18 U/L (ref 15–37)
Albumin: 3 g/dL — ABNORMAL LOW (ref 3.40–5.00)
Alk. phosphatase: 108 U/L (ref 50–136)
Anion gap: 13 mmol/L (ref 10–17)
BUN/Creatinine ratio: 4 — ABNORMAL LOW (ref 6.0–20.0)
BUN: 29 MG/DL — ABNORMAL HIGH (ref 7–18)
Bilirubin, total: 0.4 MG/DL (ref 0.20–1.00)
CO2: 31 MMOL/L (ref 21–32)
Calcium: 8.5 MG/DL (ref 8.5–10.1)
Chloride: 96 MMOL/L — ABNORMAL LOW (ref 98–107)
Creatinine: 7.3 MG/DL — CR (ref 0.6–1.3)
GFR est AA: 11 mL/min/{1.73_m2} — ABNORMAL LOW (ref >60)
GFR est non-AA: 9 mL/min/{1.73_m2} — ABNORMAL LOW (ref >60)
Globulin: 6.9 g/dL
Glucose: 105 MG/DL (ref 74–106)
Potassium: 4.1 MMOL/L (ref 3.50–5.10)
Protein, total: 9.9 g/dL — ABNORMAL HIGH (ref 6.40–8.20)
Sodium: 136 MMOL/L (ref 136–145)

## 2012-12-03 LAB — MAGNESIUM: Magnesium: 2 MG/DL (ref 1.8–2.4)

## 2012-12-03 LAB — PHOSPHORUS: Phosphorus: 4.4 MG/DL (ref 2.50–4.90)

## 2012-12-04 LAB — TYPE + CROSSMATCH
ABO/Rh(D): B POS
Antibody screen: NEGATIVE
Status of unit: TRANSFUSED
Unit division: 0

## 2012-12-04 NOTE — Discharge Summary (Unsigned)
DATE OF ADMISSION: 11/29/2012    DATE OF DISCHARGE: 12/03/2012    ATTENDING PHYSICIAN: Gustavus Messing, MD    CONSULTS: Nephrology - Jeanice Lim. Tildon Husky, MD    BRIEF MEDICAL HISTORY: Apparently, the patient is a 31 year old African  American male with medical history significant for hypertension, sickle  cell disease, morbid obesity, ongoing tobacco use. He presented via the  emergency department with complaint of pain all over. Because of ongoing  joint pain and persistent tachycardia, dehydration, the patient was  admitted for further evaluation and treatment.    PAST MEDICAL HISTORY: Hypertension, sickle cell disease, obesity, tobacco  abuse.    SURGICAL HISTORY: Includes a right hip replacement secondary to avascular  necrosis.    FAMILY HISTORY: Includes hypertension and diabetes.    SOCIAL HISTORY: The patient is currently single. He has no children. He is  unemployed. He does continue to engage in the use of tobacco products,  smoking approximately half a pack of cigarettes a day. He denies any  illicit drugs or alcohol abuse.    CODE STATUS: FULL CODE.    ALLERGIES  1. MORPHINE.  2. FENTANYL.    MEDICATIONS ON ADMISSION  1. Hydroxyurea 500 mg p.o. b.i.d.  2. Folic acid 1 mg p.o. daily.  3. Lisinopril, unknown dose.  4. Dilaudid 8 mg p.o. q.4h. p.r.n. pain.  5. Phenergan 25 mg p.o. q.6h. p.r.n. nausea.  6. Benadryl 50 mg p.o. daily with Dilaudid every 6 hours as needed.  7. Norvasc 5 mg p.o. daily.    REVIEW OF SYSTEMS ON DISCHARGE  HEENT: He denies any dizziness, no lightheadedness, no headaches, no visual  or hearing impairment.  NECK: No neck pain or neck stiffness.  CARDIOPULMONARY: No chest pain, no shortness of breath, no cough, no  hemoptysis.  GASTROINTESTINAL: No nausea, vomiting, diarrhea.  GENITOURINARY: No dysuria, no hematuria.  MUSCULOSKELETAL: No muscle or joint pain.  NEUROPSYCHIATRIC: Unremarkable.  All other systems reviewed and are negative.    PHYSICAL EXAMINATION  GENERAL: Reveals a  31 year old [place] American male who is awake, alert,  oriented x3.  VITAL SIGNS: Blood pressure 138/79, respirations 18, heart rate is 92,  oxygen saturation is 100% on room air.  HEENT: Head normocephalic, atraumatic. Pupils equal, round, and reactive to  light and accommodation. Extraocular movements intact. Sclerae anicteric.  Conjunctivae pink. Nostrils and ears without any discharge. Dentition is  fair. Oropharynx with no lesions.  NECK: Supple. No JVD. No carotid bruits. Trachea in the midline. No  thyromegaly.  CHEST: No deformities.  LUNGS: Clear to auscultation bilaterally.  HEART: Normal S1, S2.  ABDOMEN: Positive for bowel sounds, soft, nontender, nondistended. No  organomegaly.  EXTREMITIES: No clubbing, no cyanosis, no ecchymosis. Pulses are 2+.  NEUROLOGIC: The patient is awake, alert, oriented x3, with no focal  neurologic deficits.    LABORATORY AND IMAGING DATA: Sodium 136, potassium 4.1, chloride 96, CO2 of  31, BUN 29, glucose 105, creatinine 7.3, anion gap 13, calcium 8.5,  phosphorus 4.4, magnesium 2.0. Hemoglobin A1c 5.3. Hepatitis serology is  negative. WBC 2.7, hemoglobin 6.7, hematocrit 22.0, platelet count is 152.  INR 1.7, PTT 32.7.    Chest x-ray unremarkable.    HOSPITAL COURSE  1. Acute sickle cell crisis. The patient admitted to telemetry unit,  started on IV hydration, pain medication, oxygen support, hydroxyurea and  folic acid. In the course of his admission here, his symptoms have  significantly improved. Generalized body aches and joint pains have all  resolved.  2. End-stage renal disease. The patient presented with a creatinine of  8.10, glomerular filtration of 10. He was seen and evaluated by  nephrologist, Dr. 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 The patient was started on hemodialysis after obtaining  his consent. His dialysis site has been arranged by the social  worker prior  discharge. His viral hepatitis serology is negative. Further  recommendations regarding his dialysis will be per nephrologist, Dr.  Sherlyn Lees.  3. Anemia secondary to sickle cell disease and chronic kidney disease,  hemoglobin 6.7, hematocrit 22.0. The patient transfused 1 unit of packed  red blood cells. Monitor CBC outpatient. The patient with no active  bleeding.  4. Systemic hypertension and anasarca, possibly related to excessive fluid  retention due to severe chronic kidney disease. Fluid removed via  hemodialysis. On discharge his anasarca has resolved.  5. Hyperphosphatemia. The patient started on PhosLo 667 p.o. t.i.d.  Phosphorus level within normal limits on discharge.    CODE STATUS: FULL CODE.    DISCHARGE DIAGNOSES  1. End-stage renal disease on hemodialysis, new.  2. Anemia of chronic disease.  3. Sickle cell crisis.  4. Hypertension.  5. Anasarca.  6. Hyperphosphatemia.  7. Morbid obesity.  8. Ongoing tobacco use.  9. History of avascular necrosis, right hip, status post right hip  replacement.    All the above diagnoses were present on admission.    DISCHARGE MEDICATIONS  1. Lisinopril 20 mg p.o. daily.  2. Hydroxyurea 500 mg p.o. b.i.d.  3. Folic acid 1 mg p.o. daily.  4. Dilaudid 8 mg p.o. q.4h. p.r.n. pain.  5. Norvasc 5 mg p.o. daily.    DIET: Renal diet.    ACTIVITY: As tolerated.    DISPOSITION: The patient discharged home, advised to follow with the  nephrologist, Dr. Sherlyn Lees, for his regularly scheduled hemodialysis  sessions.        Electronically viewed and signed by Marguarite Arbour Tianna Baus, PA-C on 12/04/2012  07:13 A.        Dictated by: Adela Glimpse Armelia Penton]  Job#: [[id number]]  DD: [12/03/2012 02:17 P]  DT: [12/04/2012 02:03 A]  Transcribed by: [[email or web address]]    cc:            Briscoe Deutscher Omir Cooprider

## 2012-12-11 LAB — METABOLIC PANEL, BASIC
Anion gap: 11 mmol/L (ref 10–17)
BUN/Creatinine ratio: 4 — ABNORMAL LOW (ref 6.0–20.0)
BUN: 20 MG/DL — ABNORMAL HIGH (ref 7–18)
CO2: 32 MMOL/L (ref 21–32)
Calcium: 9 MG/DL (ref 8.5–10.1)
Chloride: 100 MMOL/L (ref 98–107)
Creatinine: 4.9 MG/DL — ABNORMAL HIGH (ref 0.6–1.3)
GFR est AA: 18 mL/min/{1.73_m2} — ABNORMAL LOW (ref >60)
GFR est non-AA: 15 mL/min/{1.73_m2} — ABNORMAL LOW (ref >60)
Glucose: 113 MG/DL — ABNORMAL HIGH (ref 74–106)
Potassium: 4.2 MMOL/L (ref 3.50–5.10)
Sodium: 139 MMOL/L (ref 136–145)

## 2012-12-11 LAB — MAGNESIUM: Magnesium: 2 MG/DL (ref 1.8–2.4)

## 2012-12-11 LAB — CBC WITH AUTOMATED DIFF
ABS. BASOPHILS: 0 10*3/uL (ref 0.0–0.2)
ABS. EOSINOPHILS: 0.1 10*3/uL (ref 0.0–0.7)
ABS. LYMPHOCYTES: 0.9 10*3/uL — ABNORMAL LOW (ref 1.2–3.4)
ABS. MONOCYTES: 0.4 10*3/uL — ABNORMAL LOW (ref 1.1–3.2)
ABS. NEUTROPHILS: 2.5 10*3/uL (ref 1.4–6.5)
BASOPHILS: 1 % (ref 0–2)
EOSINOPHILS: 3 % (ref 0–5)
HCT: 24.4 % — ABNORMAL LOW (ref 36.8–45.2)
HGB: 7.9 g/dL — ABNORMAL LOW (ref 12.8–15.0)
IMMATURE GRANULOCYTES: 0.3 % (ref 0.0–5.0)
LYMPHOCYTES: 24 % (ref 16–40)
MCH: 32 PG — ABNORMAL HIGH (ref 27–31)
MCHC: 32.4 g/dL (ref 32–36)
MCV: 98.8 FL (ref 81–99)
MONOCYTES: 9 % (ref 0–12)
MPV: 8.7 FL (ref 7.4–10.4)
NEUTROPHILS: 63 % (ref 40–70)
PLATELET: 108 10*3/uL — ABNORMAL LOW (ref 140–450)
RBC: 2.47 M/uL — ABNORMAL LOW (ref 4.0–5.2)
RDW: 18.7 % — ABNORMAL HIGH (ref 11.5–14.5)
WBC: 3.9 10*3/uL — ABNORMAL LOW (ref 4.8–10.8)

## 2012-12-11 LAB — HEPATIC FUNCTION PANEL
A-G Ratio: 0.5 — ABNORMAL LOW (ref 1.0–3.1)
ALT (SGPT): 15 U/L (ref 12.0–78.0)
AST (SGOT): 18 U/L (ref 15–37)
Albumin: 3 g/dL — ABNORMAL LOW (ref 3.40–5.00)
Alk. phosphatase: 118 U/L (ref 50–136)
Bilirubin, direct: 0.2 MG/DL (ref 0.0–0.20)
Bilirubin, total: 0.4 MG/DL (ref 0.20–1.00)
Globulin: 6.6 g/dL
Protein, total: 9.6 g/dL — ABNORMAL HIGH (ref 6.40–8.20)

## 2012-12-11 LAB — RETICULOCYTE COUNT: Reticulocyte count: 1.5 % (ref 0.5–1.5)

## 2012-12-19 LAB — MAGNESIUM: Magnesium: 2.1 MG/DL (ref 1.8–2.4)

## 2012-12-19 LAB — CBC WITH AUTOMATED DIFF
ABS. BASOPHILS: 0 10*3/uL (ref 0.0–0.2)
ABS. BASOPHILS: 0 10*3/uL (ref 0.0–0.2)
ABS. EOSINOPHILS: 0.1 10*3/uL (ref 0.0–0.7)
ABS. EOSINOPHILS: 0.2 10*3/uL (ref 0.0–0.7)
ABS. LYMPHOCYTES: 0.7 10*3/uL — ABNORMAL LOW (ref 1.2–3.4)
ABS. LYMPHOCYTES: 0.7 10*3/uL — ABNORMAL LOW (ref 1.2–3.4)
ABS. MONOCYTES: 0.3 10*3/uL — ABNORMAL LOW (ref 1.1–3.2)
ABS. MONOCYTES: 0.4 10*3/uL — ABNORMAL LOW (ref 1.1–3.2)
ABS. NEUTROPHILS: 1.4 10*3/uL (ref 1.4–6.5)
ABS. NEUTROPHILS: 1.9 10*3/uL (ref 1.4–6.5)
BASOPHILS: 0 % (ref 0–2)
BASOPHILS: 1 % (ref 0–2)
EOSINOPHILS: 4 % (ref 0–5)
EOSINOPHILS: 6 % — ABNORMAL HIGH (ref 0–5)
HCT: 23.4 % — ABNORMAL LOW (ref 36.8–45.2)
HCT: 23.8 % — ABNORMAL LOW (ref 36.8–45.2)
HGB: 7.7 g/dL — ABNORMAL LOW (ref 12.8–15.0)
HGB: 8 g/dL — ABNORMAL LOW (ref 12.8–15.0)
IMMATURE GRANULOCYTES: 0 % (ref 0.0–5.0)
IMMATURE GRANULOCYTES: 0 % (ref 0.0–5.0)
LYMPHOCYTES: 23 % (ref 16–40)
LYMPHOCYTES: 25 % (ref 16–40)
MCH: 31.8 PG — ABNORMAL HIGH (ref 27–31)
MCH: 32.1 PG — ABNORMAL HIGH (ref 27–31)
MCHC: 32.9 g/dL (ref 32–36)
MCHC: 33.6 g/dL (ref 32–36)
MCV: 95.6 FL (ref 81–99)
MCV: 96.7 FL (ref 81–99)
MONOCYTES: 14 % — ABNORMAL HIGH (ref 0–12)
MONOCYTES: 9 % (ref 0–12)
MPV: 9 FL (ref 7.4–10.4)
MPV: 9.2 FL (ref 7.4–10.4)
NEUTROPHILS: 54 % (ref 40–70)
NEUTROPHILS: 64 % (ref 40–70)
PLATELET: 90 10*3/uL — ABNORMAL LOW (ref 140–450)
PLATELET: 98 10*3/uL — ABNORMAL LOW (ref 140–450)
RBC: 2.42 M/uL — ABNORMAL LOW (ref 4.0–5.2)
RBC: 2.49 M/uL — ABNORMAL LOW (ref 4.0–5.2)
RDW: 17.4 % — ABNORMAL HIGH (ref 11.5–14.5)
RDW: 17.6 % — ABNORMAL HIGH (ref 11.5–14.5)
WBC: 2.6 10*3/uL — ABNORMAL LOW (ref 4.8–10.8)
WBC: 3 10*3/uL — ABNORMAL LOW (ref 4.8–10.8)

## 2012-12-19 LAB — METABOLIC PANEL, BASIC
Anion gap: 19 mmol/L — ABNORMAL HIGH (ref 10–17)
BUN/Creatinine ratio: 7 (ref 6.0–20.0)
BUN: 70 MG/DL — ABNORMAL HIGH (ref 7–18)
CO2: 20 MMOL/L — ABNORMAL LOW (ref 21–32)
Calcium: 8 MG/DL — ABNORMAL LOW (ref 8.5–10.1)
Chloride: 101 MMOL/L (ref 98–107)
Creatinine: 10.4 MG/DL — CR (ref 0.6–1.3)
GFR est AA: 7 mL/min/{1.73_m2} — ABNORMAL LOW (ref >60)
GFR est non-AA: 6 mL/min/{1.73_m2} — ABNORMAL LOW (ref >60)
Glucose: 146 MG/DL — ABNORMAL HIGH (ref 74–106)
Potassium: 5.4 MMOL/L — ABNORMAL HIGH (ref 3.50–5.10)
Sodium: 135 MMOL/L — ABNORMAL LOW (ref 136–145)

## 2012-12-19 LAB — TYPE + CROSSMATCH
ABO/Rh(D): B POS
Antibody screen: NEGATIVE

## 2012-12-19 LAB — CARDIAC BATTERY
CK-MB: 1.4 ng/mL (ref <4.3)
CK-MB: 1.2 ng/mL (ref <4.3)
Myoglobin: 258 ng/mL — ABNORMAL HIGH (ref <107)
Myoglobin: 241 ng/mL — ABNORMAL HIGH (ref <107)
Troponin-I: <0.05 ng/mL (ref <0.05)
Troponin-I: <0.05 ng/mL (ref <0.05)

## 2012-12-19 LAB — HEPATIC FUNCTION PANEL
A-G Ratio: 0.5 — ABNORMAL LOW (ref 1.0–3.1)
ALT (SGPT): 27 U/L (ref 12.0–78.0)
AST (SGOT): 33 U/L (ref 15–37)
Albumin: 2.9 g/dL — ABNORMAL LOW (ref 3.40–5.00)
Alk. phosphatase: 138 U/L — ABNORMAL HIGH (ref 50–136)
Bilirubin, direct: 0.1 MG/DL (ref 0.0–0.20)
Bilirubin, total: 0.4 MG/DL (ref 0.20–1.00)
Globulin: 6.4 g/dL
Protein, total: 9.3 g/dL — ABNORMAL HIGH (ref 6.40–8.20)

## 2012-12-19 LAB — CARDIAC BATTERY WITH BNP
BNP: 1170 pg/mL — ABNORMAL HIGH (ref 5–100)
CK-MB: 1.7 ng/mL (ref <4.3)
Myoglobin: 275 ng/mL — ABNORMAL HIGH (ref <107)
Troponin-I: <0.05 ng/mL (ref <0.05)

## 2012-12-19 LAB — CK
CK: 123 U/L (ref 26–308)
CK: 113 U/L (ref 26–308)

## 2012-12-19 LAB — SED RATE, AUTOMATED: Sed rate, automated: 117 mm/hr

## 2012-12-19 LAB — RETICULOCYTE COUNT: Reticulocyte count: 0.9 % (ref 0.5–1.5)

## 2012-12-19 LAB — PROTHROMBIN TIME + INR
INR: 1.1
Prothrombin time: 11.6 s (ref 9.8–12.0)

## 2012-12-19 LAB — PTT: aPTT: 31.5 s (ref 24.5–31.6)

## 2012-12-19 NOTE — Consults (Signed)
DATE OF CONSULTATION: 12/19/2012    REQUESTING PHYSICIAN: Radene Gunning, MD.    CONSULTING PHYSICIAN: Jeanice Lim. Kelso Bibby, MD.    REASON FOR CONSULTATION: Semi-urgent evaluation and management of severe  renal failure and increasing shortness of breath.    HISTORY OF PRESENT ILLNESS: The patient is a 31-year-young African American  male patient with medical morbidities including chronic kidney disease  stage V due to sickle cell disease. He is known to me from last admission  in mid 11/2012. He was at [facility] from 11/29/2012 to 12/03/2012 and  then took discharge against medical advice. The patient says that he has  not dialyzed since then. He was admitted yesterday via ER with increasing  cough, shortness of breath and edema over feet. The patient is found to  have pancytopenia and creatinine of more than 10.8 mg percent, potassium of  5.4 mEq/L. The patient has orthopnea. He denies chest pain. He has  generalized malaise and weakness. He has nausea but no vomiting. He admits  some itching. He has nausea and also vomited one time. Last ED visit was on  12/11/2012 when serum creatinine was 4.9 mg percent.    PAST MEDICAL HISTORY:  1. Sickle cell disease for many years.  2. Systemic hypertension.  3. Obesity.  4. Tobacco use.  5. Substance use, but not active problem as per patient.  6. 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    ALLERGIES:  1. MORPHINE.  2. FENTANYL.    MEDICATIONS: Included  1. Lisinopril.  2. Hydroxyurea 500 mg b.i.d.  3. Folic acid 1 mg daily.  4. Norvasc 5 mg daily.    The patient has received Dilaudid as well as Benadryl and Phenergan in ER.    PERSONAL/SOCIAL HISTORY: The patient is single. He is unemployed. He has no  children. He smokes cigarettes, half pack per day. He denies any active use  of drugs or alcohol. Unfortunately, he does not provide correct information  like his Social Security number as well as previous hospital related  information from [place].     FAMILY HISTORY: Both parents have sickle cell trait. The patient has 2  sisters and one sister has sickle cell disease. A strong family history of  hypertension and diabetes. No family history of end-stage renal disease.    REVIEW OF SYSTEMS: The patient is quite dyspneic, in mild respiratory  distress, so unable to get detailed review of systems.    PHYSICAL EXAMINATION:  GENERAL: The patient is awake. He is orthopneic. He is 5 feet 5 inches tall  and weighs 107 kg.  VITAL SIGNS: Temperature is 97.5, pulse 102/minute, blood pressure 186/99  mmHg.  HEENT: He has certainly marked pallor of mucous membrane. No jaundice.  NECK: Veins are engorged. No cervical lymphadenopathy.  CHEST: Bilateral lower zone crepitations, more marked on the left side.  CARDIAC: S1, S2 with grade 3/6 mid systolic murmur. He has right-sided  Port-A-Cath under right clavicle.  ABDOMEN: Mildly obese, soft, nontender.  EXTREMITIES: 3-4+ edema. Few tattoos over right upper extremity. Right  upper arm AV fistula is patent. He has some more swelling of right upper  extremity, probably related central venous occlusion from Port-A-Cath on  same side.    LABORATORY DATA: Hemoglobin 8, WBC 3, platelets 98. Sodium 135, potassium  5.4, CO2 of 20, BUN 70, creatinine 10.4, anion gap 19, calcium 8.9,  magnesium 2.1.    Chest x-ray shows some infiltrate more marked on the left side, but  there  may be possibility of pneumonia.    ASSESSMENT AND PLAN:  1. Severe anasarca with mild hyperkalemia and chronic kidney disease stage  V. The patient needs dialysis today. I have already informed the acute  dialysis nurse on call duty today and she will arrange to restart his  dialysis. We will try to remove good amount of fluid also so that his  hypertension and anasarca can improve. Unfortunately, we will not be able  to dialyze longer hours as there is risk of dialysis disequilibrium  syndrome.  2. Anemia. I will arrange him to receive Procrit 20,000 units IV on   dialysis. I also ordered furosemide 120 mg IV now, along with metolazone 10  mg p.o. The patient will probably dialyze daily for next 2, 3 days to  correct his anasarca. He will also need outpatient hemodialysis spot for  continuation of renal replacement therapy.        Electronically viewed and signed by Janeece Agee, MD on 12/28/2012 11:28  A.        Dictated by: Sherlyn Lees, M.D.]  Job#: [[id number]]  DD: [12/19/2012 09:46 A]  DT: [12/19/2012 10:13 A]  Transcribed by: [wmx]    cc:            Inocencio Homes, M.D.

## 2012-12-19 NOTE — H&P (Unsigned)
ATTENDING PHYSICIAN: Gustavus Messing, MD    CONSULTANT: Nephrology, Jeanice Lim. Modi, MD    REASON FOR ADMISSION: Generalized pain, shortness of breath and end-stage  renal disease.    HISTORY OF PRESENT ILLNESS: This is a 32 year old African American male who  presents to the emergency department of [facility] with  complaints of nausea, vomiting, and generalized joint discomfort. The  patient does have a past medical history significant for hypertension,  sickle cell disease, obesity. The patient states that over the previous 24  hours he has noticed increasing joint discomfort and this morning he had  several episodes of nausea, vomiting. The patient also mentions that he has  been noncompliant with his medications and has failed to followup with the  outpatient nephrologist to establish outpatient dialysis. He does mention  that his last dialysis session was approximately 2 weeks ago prior to his  discharge on 12/03/2012. While in the emergency room undergoing a  diagnostic workup, it was noted that the patient did have an elevated  potassium of 5.4. He had a creatinine of 10.4. Further workup revealed that  the patient also had an elevated blood pressure of 174/94. It was also  noted on his complete blood count that the patient had a hemoglobin of 8.0  and hematocrit of 23.8. Based on these findings and the patient's  continuing complaints of joint discomfort, it was determined that the  patient would be admitted for further evaluation and management.    PAST MEDICAL HISTORY: Hypertension, sickle cell disease, obesity, tobacco  abuse.    PAST SURGICAL HISTORY: Includes right hip replacement secondary to  avascular necrosis.    FAMILY HISTORY: Includes hypertension and diabetes.    SOCIAL HISTORY: The patient is currently single. He has no children. He is  currently unemployed. The patient continues to use tobacco products,  smoking approximately half pack cigarettes a day. He denies any illicit  drug  use or alcohol abuse. The patient recently moved from [place]  and has not established primary care yet here in [place].    CODE STATUS: FULL CODE.    ALLERGIES  1. MORPHINE.  2. FENTANYL.    MEDICATIONS ON ADMISSION: The patient was unable to provide a detailed list  of medications at the time of admission. The following medications were  adopted from the most recent discharge summary dated 12/03/2012:    1. Lisinopril 20 mg p.o. daily.  2. Hydroxyurea 500 mg p.o. b.i.d.  3. Folic acid 1 mg p.o. daily.  4. Dilaudid 8 mg p.o. q.4h. p.r.n.  5. Norvasc 5 mg p.o. daily.    REVIEW OF SYSTEMS  GENERAL: The patient does make mention of a recent weight gain. He admits  to some mild fatigue but denies any weakness, fever, chills, or night  sweats.  HEAD: The patient denies any headache, head trauma, or visual changes.  CARDIAC: The patient does have history of hypertension. He denies any chest  pain, palpitations. He does mention some mild dyspnea on exertion and  orthopnea. He denies any paroxysmal nocturnal dyspnea.  RESPIRATORY: The patient again does have mild shortness of breath. He  denies any wheezing, coughing, hemoptysis, or recent respiratory tract  infection.  GASTROINTESTINAL: The patient does mention several episodes of nausea,  vomiting. He denies any indigestion, dyspepsia, diarrhea, constipation.  GENITOURINARY: The patient denies any urinary frequency. The patient has  end-stage renal failure.  MUSCULOSKELETAL: The patient denies any muscle weakness, but does continue  to complain of  generalized joint pain and discomfort. He denies any history  of gouty arthritis.  NEUROLOGIC: The patient denies any loss of sensation, any numbness,  tingling, tremors, paralysis, or seizures.    PHYSICAL EXAMINATION  GENERAL: This is a 32 year old [place] American male who is awake, alert,  and oriented x2 to 3. He appears unkempt. He has notable body order. He  appears in no acute distress. He is able to follow  commands. He is able to  participate in medical decision making process. He appears somewhat  ambivalent about establishing the necessary outpatient care.  VITAL SIGNS: Blood pressure was 174/94, heart rate 101, respirations 21,  temperature 98.3, oxygen saturation 99% on 2 liters nasal cannula.  HEENT: Head is normocephalic, atraumatic. Pupils are equal but sluggish to  light and accommodation. EOMs are intact with no scleral icterus.  Conjunctivae are pale. Nostrils and ears are without discharge. The patient  does have multiple hyperpigmented lesions on his face of unknown etiology.  Nasal septum is midline. The patient does have fair oral dentition, no oral  lesions were noted.  NECK: Soft, supple, 1-2+ JVD in the lying position. Otherwise, no carotid  bruits. No carotid bruits, trachea is midline, no thyromegaly.  CHEST: Symmetrical, equal excursion.  HEART: S1, S2. No murmurs or rubs appreciated. The patient is sinus rhythm  on the monitor.  LUNGS: Diminished throughout. No wheezes or rhonchi.  ABDOMEN: Obese, soft, diffusely tender. Normoactive bowel sounds.  EXTREMITIES: Warm, dry, significant bilateral lower extremity edema. The  patient does have very dry, cracked skin of lower extremities. The patient  does continue to complain of joint discomfort with passive range of  motion.    LABORATORY DATA: Laboratory values on the date of admission: Sodium 135,  potassium 5.4, chloride 101, CO2 20, BUN 70, glucose 146, creatinine 10.4,  anion gap 19, calcium 8.9, magnesium 2.1.    COMPLETE BLOOD COUNT: WBC is 3.0, hemoglobin 8.0, hematocrit 23.8,  platelets 98.    EKG shows sinus tachycardia with right atrial enlargement, rate of 102.    Chest x-ray pending at the time of this dictation.    IMPRESSION  1. Nausea, vomiting.  2. End-stage renal disease.  3. Noncompliance.  4. Hyperkalemia.  5. Tobacco abuse.  6. Hypertension.  7. Sickle cell crisis.  8. Obesity.    PLAN: The patient will be admitted to telemetry  unit for further  monitoring. The patient will receive 3 full sets of cardiac biomarkers to  rule out an acute myocardial event. The patient will also receive repeat  EKG in the a.m. Will also obtain repeat labs to include CBC, BMP,  haptoglobin, retic count, CRP, and a hemoglobin A1c. Will also gently  rehydrate the patient with 500 mL of normal saline. Dr. Tildon Husky, staff  nephrologist, was called and has agreed to evaluate the patient during his  inpatient admission. Will also obtain a type and cross given the  patient's decreased hemoglobin and hematocrit. The patient will be  continued on his routine outpatient antihypertensive medications. The  patient will be placed on famotidine for GI prophylaxis and heparin for DVT  prophylaxis. Will start the patient on clear liquid diet until his nausea  improves. Will control his pain with IV Dilaudid.    Attending physician, Dr. Gustavus Messing, was contacted regarding the  admission of this patient, which he accepted.        Mayo Ao PA        Dictated by: Shonna Chock. Dedria Endres]  Job#: [[id number]]  DD: [12/19/2012 01:35 A]  DT: [12/19/2012 02:19 A]  Transcribed by: [wmx]    cc:            Melvenia Beam, P.A. Yetta Barre

## 2012-12-20 LAB — MAGNESIUM: Magnesium: 2.2 MG/DL (ref 1.8–2.4)

## 2012-12-20 LAB — CBC WITH AUTOMATED DIFF
ABS. BASOPHILS: 0 10*3/uL (ref 0.0–0.2)
ABS. EOSINOPHILS: 0 10*3/uL (ref 0.0–0.7)
ABS. LYMPHOCYTES: 0.4 10*3/uL — ABNORMAL LOW (ref 1.2–3.4)
ABS. MONOCYTES: 0.2 10*3/uL — ABNORMAL LOW (ref 1.1–3.2)
ABS. NEUTROPHILS: 2.5 10*3/uL (ref 1.4–6.5)
BASOPHILS: 0 % (ref 0–2)
EOSINOPHILS: 0 % (ref 0–5)
HCT: 27.4 % — ABNORMAL LOW (ref 36.8–45.2)
HGB: 9.2 g/dL — ABNORMAL LOW (ref 12.8–15.0)
IMMATURE GRANULOCYTES: 0.3 % (ref 0.0–5.0)
LYMPHOCYTES: 13 % — ABNORMAL LOW (ref 16–40)
MCH: 32.4 PG — ABNORMAL HIGH (ref 27–31)
MCHC: 33.6 g/dL (ref 32–36)
MCV: 96.5 FL (ref 81–99)
MONOCYTES: 6 % (ref 0–12)
MPV: 8.9 FL (ref 7.4–10.4)
NEUTROPHILS: 81 % — ABNORMAL HIGH (ref 40–70)
PLATELET: 82 10*3/uL — ABNORMAL LOW (ref 140–450)
RBC: 2.84 M/uL — ABNORMAL LOW (ref 4.0–5.2)
RDW: 17.9 % — ABNORMAL HIGH (ref 11.5–14.5)
WBC: 3.2 10*3/uL — ABNORMAL LOW (ref 4.8–10.8)

## 2012-12-20 LAB — METABOLIC PANEL, BASIC
Anion gap: 22 mmol/L — ABNORMAL HIGH (ref 10–17)
BUN/Creatinine ratio: 7 (ref 6.0–20.0)
BUN: 70 MG/DL — ABNORMAL HIGH (ref 7–18)
CO2: 19 MMOL/L — ABNORMAL LOW (ref 21–32)
Calcium: 8.1 MG/DL — ABNORMAL LOW (ref 8.5–10.1)
Chloride: 99 MMOL/L (ref 98–107)
Creatinine: 9.9 MG/DL — CR (ref 0.6–1.3)
GFR est AA: 8 mL/min/{1.73_m2} — ABNORMAL LOW (ref >60)
GFR est non-AA: 7 mL/min/{1.73_m2} — ABNORMAL LOW (ref >60)
Glucose: 105 MG/DL (ref 74–106)
Potassium: 6 MMOL/L — ABNORMAL HIGH (ref 3.50–5.10)
Sodium: 134 MMOL/L — ABNORMAL LOW (ref 136–145)

## 2012-12-20 LAB — HEMOGLOBIN A1C WITH EAG: Hemoglobin A1c: 4.9 % (ref 4.4–6.4)

## 2012-12-20 LAB — C REACTIVE PROTEIN, QT: C-Reactive Protein, Qt: 20.7 mg/L — ABNORMAL HIGH (ref 0.0–4.9)

## 2012-12-20 LAB — HAPTOGLOBIN: Haptoglobin: 55 mg/dL (ref 34–200)

## 2012-12-20 LAB — PHOSPHORUS: Phosphorus: 6.7 MG/DL — ABNORMAL HIGH (ref 2.50–4.90)

## 2012-12-24 LAB — CULTURE, BLOOD

## 2012-12-30 IMAGING — CT CT ABD-PELV W/O CM
1 of 3 series · 14 of 32 positions shown, 19 images · non-contrast
Comparison: none

REASON FOR EXAM: (1) pancreatitis; (2) R pelvic changes on noncontrast
CT;    NOTE: Nursing to Gi
COMMENTS:

PROCEDURE:     CT  - CT ABDOMEN AND PELVIS W[DATE]  [DATE]
RESULT:     History: Pancreatitis.
Comparison Study: Prior CT of 03/14/2012.

[Series 3: [place] · axial · 0.80mm/px · z∈[-462,-40]mm · 14 of 316 slices shown, 19 images]
[im 17/316  soft-tissue]
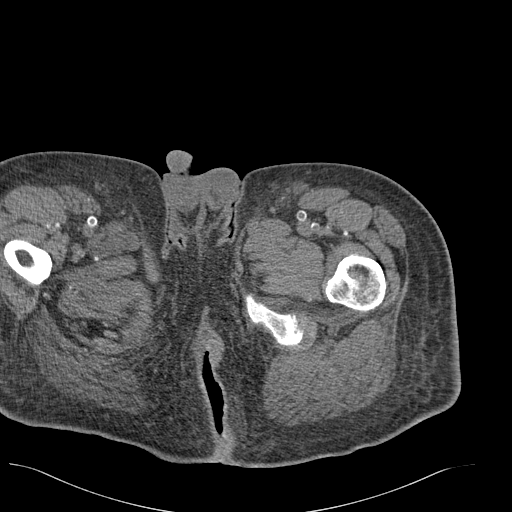
[im 17/316  bone]
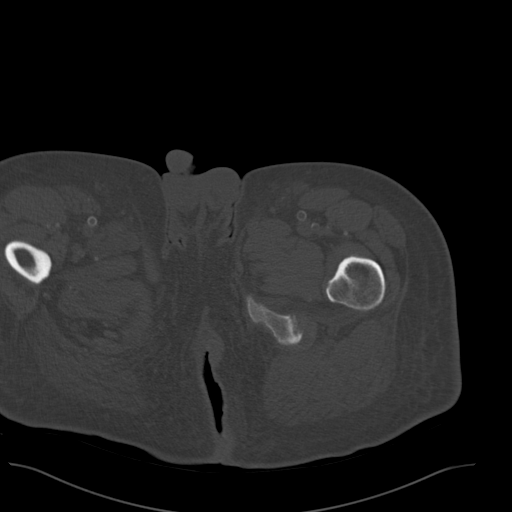
[im 50/316  soft-tissue]
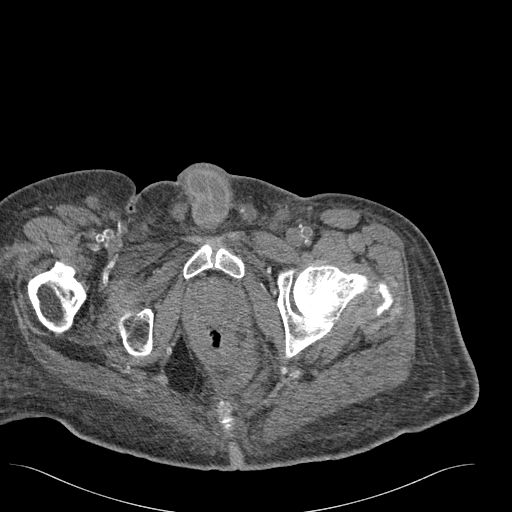
[im 67/316  soft-tissue]
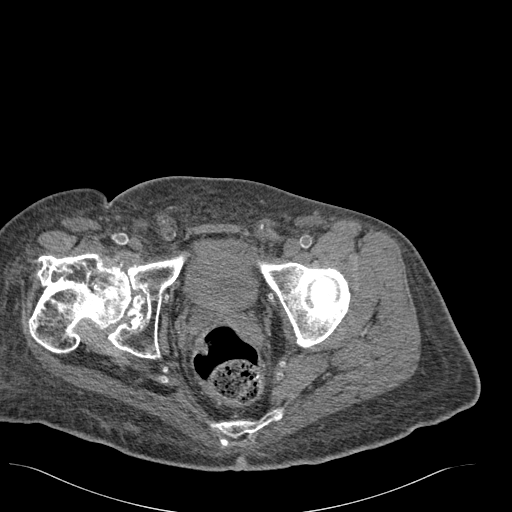
[im 83/316  soft-tissue]
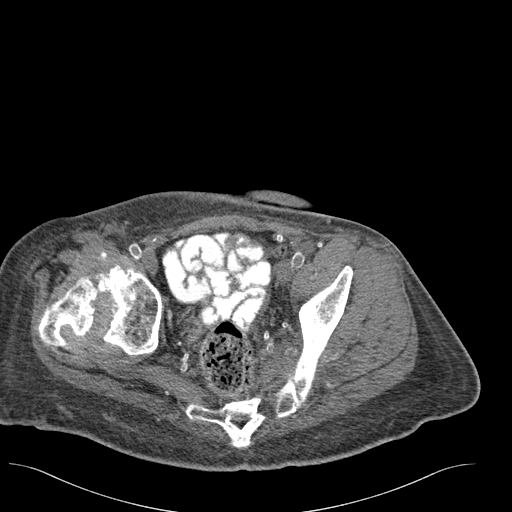
[im 117/316  soft-tissue]
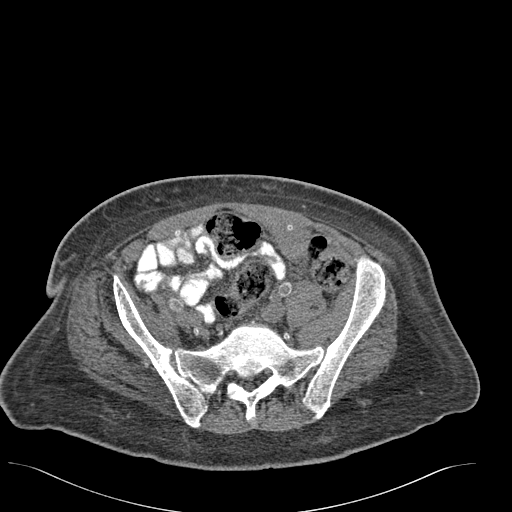
[im 133/316  soft-tissue]
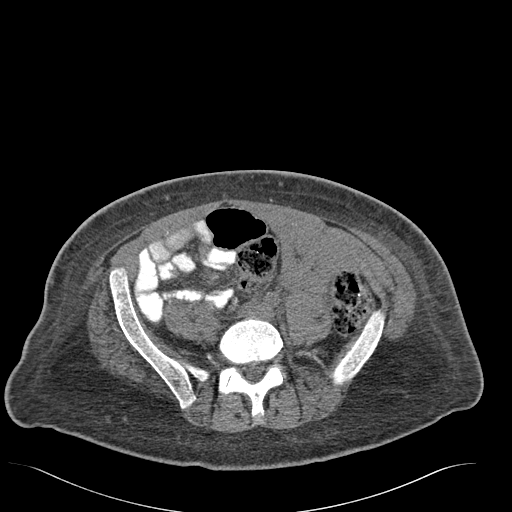
[im 166/316  soft-tissue]
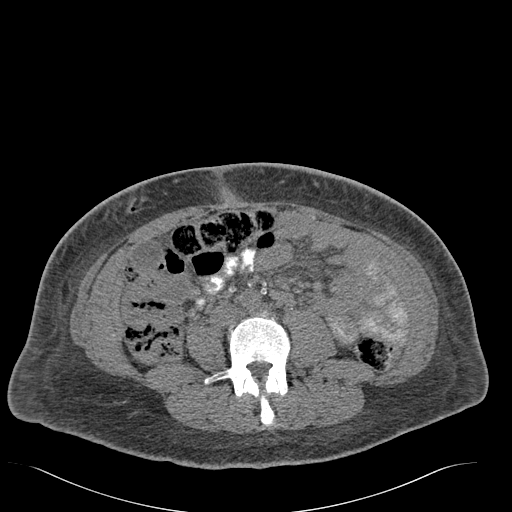
[im 183/316  soft-tissue]
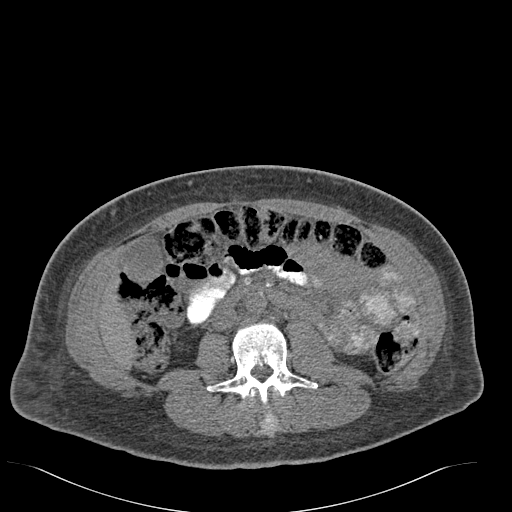
[im 199/316  soft-tissue]
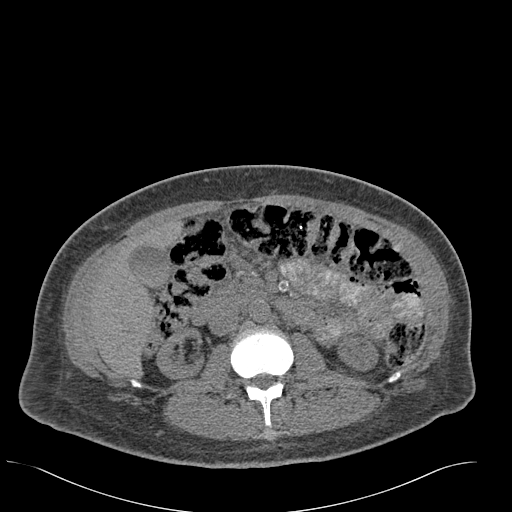
[im 199/316  bone]
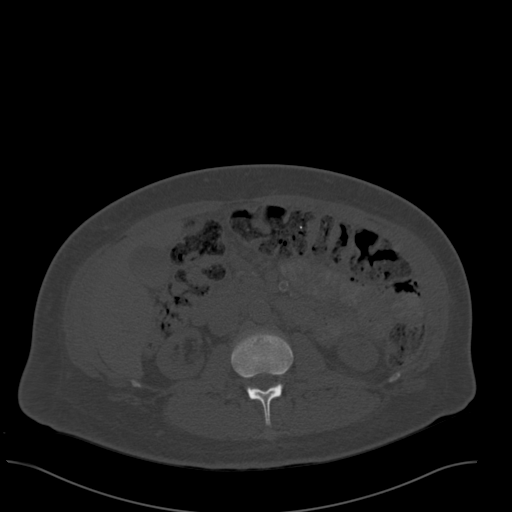
[im 233/316  soft-tissue]
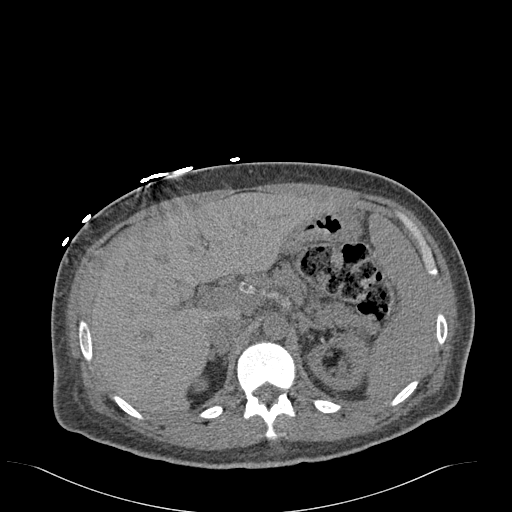
[im 249/316  soft-tissue]
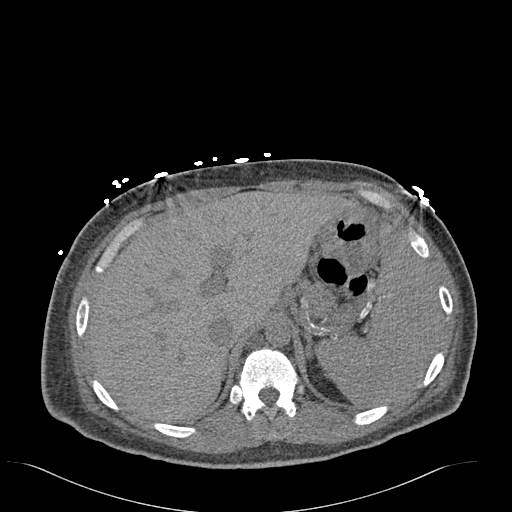
[im 249/316  lung]
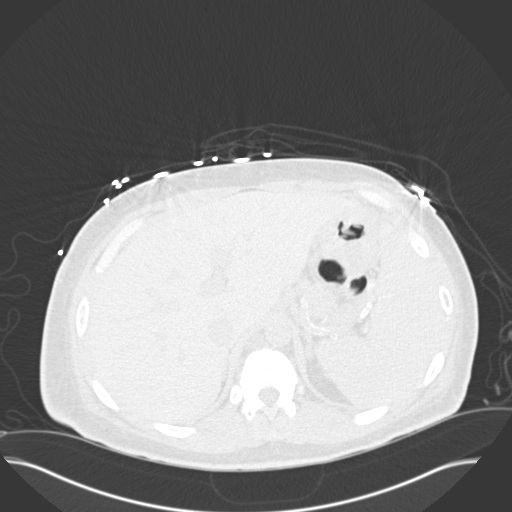
[im 266/316  soft-tissue]
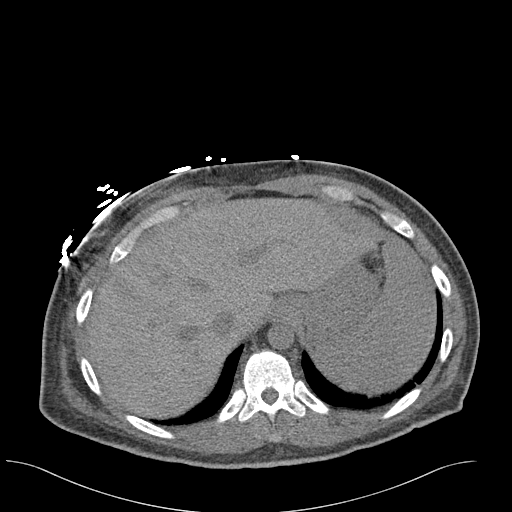
[im 266/316  lung]
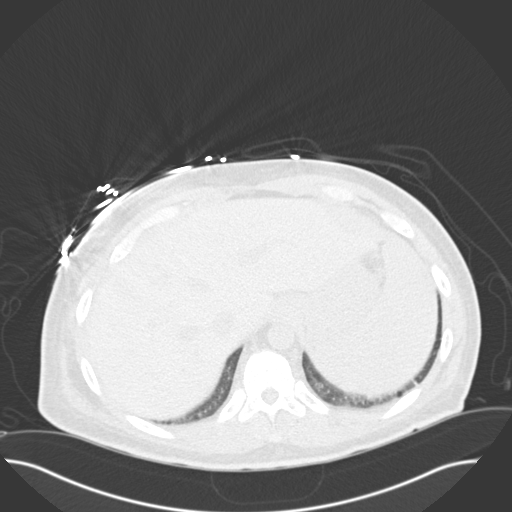
[im 282/316  lung]
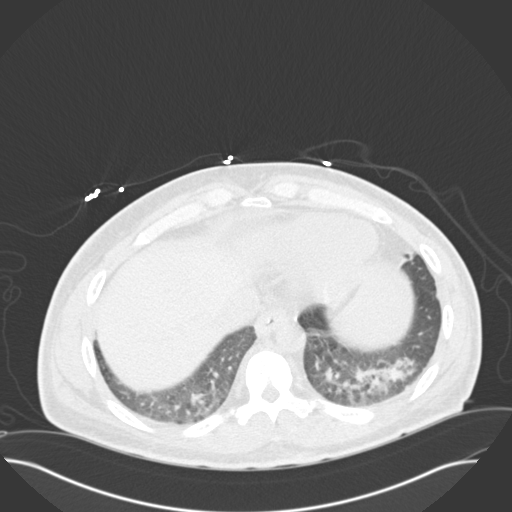
[im 299/316  soft-tissue]
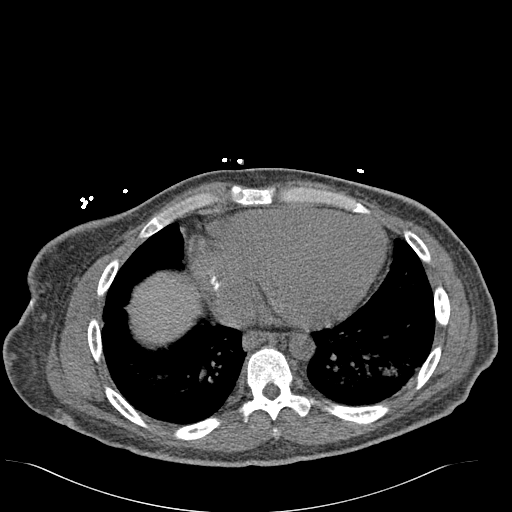
[im 299/316  lung]
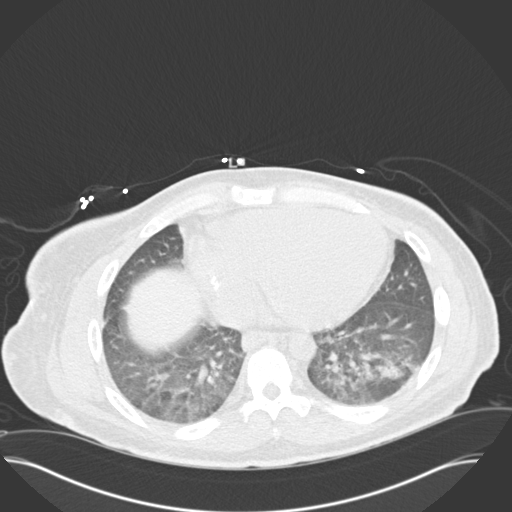

[14 of 32 positions shown; findings below may reference images not displayed]

FINDINGS: Standard nonenhanced CT obtained. Liver normal. Spleen normal.
Sludge in the gallbladder cannot be excluded. Pancreas normal. Aorta
nondistended. Iliac atherosclerotic but disease versus and/ or bypass grafts
are noted. No hydronephrosis. No inflammatory changes in the right or left
lower quadrant. Appendix not definitely identified. Large amount stool is
noted throughout the colon. Severe deformity noted about the right hip.
Basilar infiltrates consistent with pneumonia noted. No free air.
Cardiomegaly noted. Cardiac pacing wire noted. Anasarca. Injection sites
noted of the anterior abdomen.
IMPRESSION: Bibasilar infiltrates most consistent with pneumonia.
Cardiomegaly is also noted. Diffuse mild anasarca. Large amount stool in the
colon. Pancreas unremarkable. Gallbladder sludge.

## 2013-01-23 NOTE — Discharge Summary (Unsigned)
DATE OF ADMISSION: 12/18/2012    DATE OF DISCHARGE: 12/20/2012    DISCHARGE DIAGNOSES:  1. Chronic kidney disease V; end-stage renal disease.  2. Fluid overload.  3. Anasarca.  4. Hyperkalemia.  5. Anemia of chronic disease.  6. Possible left lower lobe pneumonia.  7. Leukopenia.  8. Thrombocytopenia.  9. High C-reactive protein.  10. Sickle cell disease.  11. Nausea, vomiting.  12. Hypertension, uncontrolled.    All the diagnoses were present on the admission.    INVESTIGATIONS INCLUDING PERTINENT LABORATORY: Chemistry: Sodium 135,  potassium 5.4, chloride 101, bicarbonate 20, BUN 70, creatinine 10.4,  glucose 146, anion gap 19. Hematology: WBC 2.6, hemoglobin 7.7, hematocrit  23.4, platelets 90. X-ray: left lower lobe pneumonia.    HISTORY OF PRESENT ILLNESS: This is a 32 year old African-American male  with past history of end-stage renal disease, anemia of chronic disease,  came to emergency department because of difficulty breathing, nausea,  vomiting. The patient has CKD stage V, end-stage renal disease, fluid  overload, hyperkalemia, metabolic acidosis. The patient was admitted to  hospital for hemodialysis.    The patient had anasarca, hyperkalemia, metabolic acidosis. The patient was  medically noncompliant. As per patient, he was [place] from [place] state.  The patient was planned for 3 days of hemodialysis.    The patient on 12/20/2012 decided to leave against medical advice. The  patient was told he had metabolic acidosis, hyperkalemia. The patient might  have arrhythmia and die. He also had left lower lobe pneumonia. The plan  was to repeat x-ray after the dialysis. However, patient left against  medical advice. The patient was alert, oriented x3.    Total time spent during discharge process<75min        Electronically viewed and signed by Radene Gunning, MD on 06/03/2013 10:50  A.        Dictated by: Audery Amel Zamyia Gowell]  Job#: [[id number]]  DD: [01/23/2013 09:25 A]  DT: [01/23/2013 09:40 A]  Transcribed by:  [wmx]    cc:            Radene Gunning

## 2013-02-23 LAB — CBC WITH AUTOMATED DIFF
ABS. BASOPHILS: 0 10*3/uL (ref 0.0–0.2)
ABS. EOSINOPHILS: 0.2 10*3/uL (ref 0.0–0.7)
ABS. IMM. GRANS.: 0 10*3/uL (ref 0.0–0.5)
ABS. LYMPHOCYTES: 0.7 10*3/uL — ABNORMAL LOW (ref 1.2–3.4)
ABS. MONOCYTES: 0.8 10*3/uL — ABNORMAL LOW (ref 1.1–3.2)
ABS. NEUTROPHILS: 2.4 10*3/uL (ref 1.4–6.5)
BASOPHILS: 1 % (ref 0–2)
EOSINOPHILS: 6 % — ABNORMAL HIGH (ref 0–5)
HCT: 23.9 % — ABNORMAL LOW (ref 36.8–45.2)
HGB: 7.4 g/dL — ABNORMAL LOW (ref 12.8–15.0)
IMMATURE GRANULOCYTES: 0.2 % (ref 0.0–5.0)
LYMPHOCYTES: 17 % (ref 16–40)
MCH: 31 PG (ref 27–31)
MCHC: 31 g/dL — ABNORMAL LOW (ref 32–36)
MCV: 100 FL — ABNORMAL HIGH (ref 81–99)
MONOCYTES: 19 % — ABNORMAL HIGH (ref 0–12)
MPV: 9.4 FL (ref 7.4–10.4)
NEUTROPHILS: 57 % (ref 40–70)
PLATELET: 153 10*3/uL (ref 140–450)
RBC: 2.39 M/uL — ABNORMAL LOW (ref 4.0–5.2)
RDW: 18.9 % — ABNORMAL HIGH (ref 11.5–14.5)
WBC: 4.2 10*3/uL — ABNORMAL LOW (ref 4.8–10.0)

## 2013-02-23 LAB — METABOLIC PANEL, COMPREHENSIVE
A-G Ratio: 0.4 — ABNORMAL LOW (ref 1.0–3.1)
ALT (SGPT): 15 U/L (ref 12.0–78.0)
AST (SGOT): 20 U/L (ref 15–37)
Albumin: 2.9 g/dL — ABNORMAL LOW (ref 3.40–5.00)
Alk. phosphatase: 101 U/L (ref 50–136)
Anion gap: 14 mmol/L (ref 10–17)
BUN/Creatinine ratio: 4 — ABNORMAL LOW (ref 6.0–20.0)
BUN: 19 MG/DL — ABNORMAL HIGH (ref 7–18)
Bilirubin, total: 0.4 MG/DL (ref 0.20–1.00)
CO2: 30 mmol/L (ref 21–32)
Calcium: 8.5 MG/DL (ref 8.5–10.1)
Chloride: 96 mmol/L — ABNORMAL LOW (ref 98–107)
Creatinine: 4.4 MG/DL — ABNORMAL HIGH (ref 0.6–1.3)
GFR est AA: 20 mL/min/{1.73_m2} — ABNORMAL LOW (ref >60)
GFR est non-AA: 17 mL/min/{1.73_m2} — ABNORMAL LOW (ref >60)
Globulin: 7.1 g/dL
Glucose: 133 mg/dL — ABNORMAL HIGH (ref 74–106)
Potassium: 3.9 mmol/L (ref 3.50–5.10)
Protein, total: 10 g/dL — ABNORMAL HIGH (ref 6.40–8.20)
Sodium: 136 mmol/L (ref 136–145)

## 2013-02-23 LAB — RETICULOCYTE COUNT: Reticulocyte count: 1.3 % (ref 0.5–1.5)

## 2013-02-23 MED ADMIN — promethazine (PHENERGAN) injection 25 mg: INTRAMUSCULAR | @ 15:00:00 | NDC 00641092821

## 2013-02-23 MED ADMIN — famotidine (PF) (PEPCID) 20 mg in sodium chloride 0.9 % 10 mL injection: INTRAVENOUS | @ 15:00:00 | NDC 00409488810

## 2013-02-23 MED ADMIN — HYDROmorphone (PF) (DILAUDID) injection 2 mg: INTRAVENOUS | @ 15:00:00 | NDC 00409131230

## 2013-02-23 MED ADMIN — diphenhydrAMINE (BENADRYL) capsule 50 mg: ORAL | @ 15:00:00 | NDC 00904530661

## 2013-02-23 MED ADMIN — HYDROmorphone (PF) (DILAUDID) injection 2 mg: INTRAVENOUS | @ 17:00:00 | NDC 00409131230

## 2013-02-23 NOTE — ED Notes (Signed)
Pt sleeping in room soundly. No acute distress. Awaiting disposition.

## 2013-02-23 NOTE — ED Notes (Signed)
Pt to ed with c/o nausea vomiting and bilateral lower extremity pain in joints secondary to sickle cell crisis. Pt A&OX3. Pt has steady gait with cane. Pt not actively vomiting. Awaiting md eval.

## 2013-02-23 NOTE — ED Notes (Signed)
Pt discharged with existing double lumen cath in place

## 2013-02-23 NOTE — ED Notes (Signed)
Pt states "i usually get 4 mg of dilaudid to start with, 2 mg isn't gonna help my pain." MD made aware of pt concerns and pain level.

## 2013-02-23 NOTE — ED Notes (Signed)
Pt sleeping/snoring in room. Pt easily aroused. Pt made aware that he can receive 2 mg of dilaudid every 2 hours for severe pain. Pt verbally understood. Pt states he wants 2 mg at exactly 1200.

## 2013-02-23 NOTE — ED Provider Notes (Signed)
Patient is a 32 y.o. male presenting with sickle cell disease. The history is provided by the patient.   Sickle Cell Crisis   This is a recurrent problem. The current episode started yesterday. The problem has not changed since onset.The problem occurs constantly. The pain is associated with no known injury (weather change). The pain is present in the generalized and lower back (joints). The quality of the pain is described as aching. The pain is at a severity of 8/10. The pain is severe. The symptoms are aggravated by certain positions (walking). Associated symptoms include abdominal pain (soreness in epigastric area). Pertinent negatives include no chest pain, no fever, no headaches, no dysuria and no weakness. He has tried analgesics for the symptoms. The treatment provided mild relief.    Pt has been vomiting for the past 2 days, 8-9 times total, no blood, no coffee grounds. + nausea. Pt unable to keep meds down    Past Medical History   Diagnosis Date   ??? Hypertension    ??? Other ill-defined conditions      sickle cell disease   ??? Other ill-defined conditions      Rt hip AVN        Past Surgical History   Procedure Laterality Date   ??? Hx orthopaedic  2012     Rt hip decompression         Family History   Problem Relation Age of Onset   ??? Sickle Cell Anemia Sister         History     Social History   ??? Marital Status: SINGLE     Spouse Name: N/A     Number of Children: N/A   ??? Years of Education: N/A     Occupational History   ??? Not on file.     Social History Main Topics   ??? Smoking status: Current Every Day Smoker   ??? Smokeless tobacco: Not on file   ??? Alcohol Use: No   ??? Drug Use: No   ??? Sexually Active: Not on file     Other Topics Concern   ??? Not on file     Social History Narrative   ??? No narrative on file                  ALLERGIES: Review of patient's allergies indicates no known allergies.      Review of Systems   Constitutional: Negative.  Negative for fever and diaphoresis.   HENT: Negative.  Negative  for sore throat and rhinorrhea.    Respiratory: Positive for cough (brown, changed from baseline, x 1 week). Negative for chest tightness and shortness of breath.    Cardiovascular: Negative.  Negative for chest pain and leg swelling.   Gastrointestinal: Positive for vomiting, abdominal pain (soreness in epigastric area) and diarrhea. Negative for nausea and constipation.   Genitourinary: Negative.  Negative for dysuria and difficulty urinating.   Musculoskeletal: Negative.  Negative for back pain and joint swelling.   Skin: Negative.  Negative for rash and wound.   Allergic/Immunologic: Negative.  Negative for food allergies and immunocompromised state.   Neurological: Negative.  Negative for weakness and headaches.       Filed Vitals:    02/23/13 0827   BP: 157/73   Pulse: 93   Temp: 98.7 ??F (37.1 ??C)   Resp: 16   Height: 5\' 5"  (1.651 m)   Weight: 86.183 kg (190 lb)   SpO2: 97%  Physical Exam   Nursing note and vitals reviewed.  Constitutional: He is oriented to person, place, and time. He appears well-developed and well-nourished. No distress.   HENT:   Head: Normocephalic and atraumatic.   Mouth/Throat: Oropharynx is clear and moist.   Eyes: Conjunctivae are normal. Pupils are equal, round, and reactive to light.   Neck: Normal range of motion. Neck supple.   Cardiovascular: Normal rate, regular rhythm and normal heart sounds.  Exam reveals no gallop and no friction rub.    No murmur heard.  Pulmonary/Chest: Effort normal and breath sounds normal. No respiratory distress. He has no wheezes. He has no rales. He exhibits no tenderness.   Lt upper chest permicath   Abdominal: Soft. He exhibits no distension and no mass. There is no tenderness. There is no rebound and no guarding.   Musculoskeletal: Normal range of motion. He exhibits no edema and no tenderness.   Neurological: He is alert and oriented to person, place, and time. He has normal strength. No cranial nerve deficit.   Skin: Skin is warm and  dry. Rash (acen, scars, chronic) noted.        MDM     Differential Diagnosis; Clinical Impression; Plan:     1:37 PM  Pt reassessed, feels better after IV dilaudid., ready for DC  As for decreased Hb, this was discussed with pt and he will f/u with his hematologist    Labs Reviewed  CBC WITH AUTOMATED DIFF - Abnormal; Notable for the following:      WBC                           4.2 (*)                RBC                           2.39 (*)               HGB                           7.4 (*)                HCT                           23.9 (*)               MCV                           100.0 (*)               MCHC                          31.0 (*)               RDW                           18.9 (*)               MONOCYTES                     19 (*)  EOSINOPHILS                   6 (*)                  ABS. LYMPHOCYTES              0.7 (*)                ABS. MONOCYTES                0.8 (*)             All other components within normal limits  METABOLIC PANEL, COMPREHENSIVE - Abnormal; Notable for the following:      Chloride                      96 (*)                 Glucose                       133 (*)                BUN                           19 (*)                 Creatinine                    4.40 (*)               BUN/Creatinine ratio          4 (*)                  GFR est AA                    20 (*)                 GFR est non-AA                17 (*)                 Protein, total                10.0 (*)               Albumin                       2.9 (*)                A-G Ratio                     0.4 (*)             All other components within normal limits  RETICULOCYTE COUNT      CXR 1:38 PM   [x]  Viewed by me    [x]  Interpreted by me  [x]  No acute disease   []  Abnormal : see below   []  Discussed with       Radiologist     [x]  Normal mediastinum [x]  No infiltrates  [x]  Normal heart size   Interpretation: Lt IJ in place, persistent Lt lower lobe atelectasis      Procedures

## 2013-02-23 NOTE — Progress Notes (Signed)
Transition care note: the pt has a flag to call the police when he arrives , the pt has been to the hospital several times and provided the hospital with false identification and social security number the emergency department notified immediately , the police called as directed , per officer Cophamel  The pt does not have any identification for [name] per the patient and he will send home for his [facility] identification , emergency department informed to administer pain medications judiciously until  His retic count has been established . If the patient is to be admitted the pt is to provide proper identification for admission . The pt uses the names Tyler Ballard, Tyler Ballard and Tyler Ballard and he goes to hospitals from [place] [facility] to [employer].

## 2013-02-23 NOTE — ED Notes (Signed)
Pt medicated per orders. Pt speaking with police about identity. Vital signs stable.

## 2013-02-23 NOTE — ED Notes (Signed)
Blood work sent to lab. Pt calm and cooperative. Pt has power port in l chest. Flushes nicely. Awaiting chest xray.

## 2013-02-23 NOTE — ED Notes (Signed)
Pt discharged to home. Pt educated on diagnosis and following up with PCP. Pt told to follow up for blood redraw in one week with pcp. Pt given copy of all results. Pt verbally understood.

## 2013-03-29 LAB — METABOLIC PANEL, COMPREHENSIVE
A-G Ratio: 0.4 — ABNORMAL LOW (ref 1.0–3.1)
ALT (SGPT): 18 U/L (ref 12.0–78.0)
AST (SGOT): 20 U/L (ref 15–37)
Albumin: 2.7 g/dL — ABNORMAL LOW (ref 3.40–5.00)
Alk. phosphatase: 162 U/L — ABNORMAL HIGH (ref 50–136)
Anion gap: 16 mmol/L (ref 10–17)
BUN/Creatinine ratio: 7 (ref 6.0–20.0)
BUN: 70 MG/DL — ABNORMAL HIGH (ref 7–18)
Bilirubin, total: 0.5 MG/DL (ref 0.20–1.00)
CO2: 24 mmol/L (ref 21–32)
Calcium: 8.3 MG/DL — ABNORMAL LOW (ref 8.5–10.1)
Chloride: 97 mmol/L — ABNORMAL LOW (ref 98–107)
Creatinine: 10.2 MG/DL — CR (ref 0.6–1.3)
GFR est AA: 8 mL/min/{1.73_m2} — ABNORMAL LOW (ref >60)
GFR est non-AA: 6 mL/min/{1.73_m2} — ABNORMAL LOW (ref >60)
Globulin: 7.3 g/dL
Glucose: 156 mg/dL — ABNORMAL HIGH (ref 74–106)
Potassium: 5.9 mmol/L — ABNORMAL HIGH (ref 3.50–5.10)
Protein, total: 10 g/dL — ABNORMAL HIGH (ref 6.40–8.20)
Sodium: 131 mmol/L — ABNORMAL LOW (ref 136–145)

## 2013-03-29 LAB — CBC WITH AUTOMATED DIFF
ABS. BASOPHILS: 0 10*3/uL (ref 0.0–0.2)
ABS. EOSINOPHILS: 0.3 10*3/uL (ref 0.0–0.7)
ABS. LYMPHOCYTES: 1.1 10*3/uL — ABNORMAL LOW (ref 1.2–3.4)
ABS. MONOCYTES: 0.6 10*3/uL — ABNORMAL LOW (ref 1.1–3.2)
ABS. NEUTROPHILS: 3.1 10*3/uL (ref 1.4–6.5)
BASOPHILS: 0 % (ref 0–2)
EOSINOPHILS: 7 % — ABNORMAL HIGH (ref 0–5)
HCT: 25.6 % — ABNORMAL LOW (ref 36.8–45.2)
HGB: 8.5 g/dL — ABNORMAL LOW (ref 12.8–15.0)
IMMATURE GRANULOCYTES: 0.2 % (ref 0.0–5.0)
LYMPHOCYTES: 21 % (ref 16–40)
MCH: 30.5 PG (ref 27–31)
MCHC: 33.2 g/dL (ref 32–36)
MCV: 91.8 FL (ref 81–99)
MONOCYTES: 12 % (ref 0–12)
MPV: 8.7 FL (ref 7.4–10.4)
NEUTROPHILS: 60 % (ref 40–70)
PLATELET: 123 10*3/uL — ABNORMAL LOW (ref 140–450)
RBC: 2.79 M/uL — ABNORMAL LOW (ref 4.0–5.2)
RDW: 18.8 % — ABNORMAL HIGH (ref 11.5–14.5)
WBC: 5 10*3/uL (ref 4.8–10.8)

## 2013-03-29 LAB — EKG, 12 LEAD, INITIAL
Atrial Rate: 92 {beats}/min
Calculated P Axis: 40 degrees
Calculated R Axis: -22 degrees
Calculated T Axis: 56 degrees
Diagnosis: NORMAL
P-R Interval: 188 ms
Q-T Interval: 408 ms
QRS Duration: 80 ms
QTC Calculation (Bezet): 504 ms
Ventricular Rate: 92 {beats}/min

## 2013-03-29 LAB — RETICULOCYTE COUNT: Reticulocyte count: 1.4 % (ref 0.5–1.5)

## 2013-03-29 MED ADMIN — HYDROmorphone (PF) (DILAUDID) injection 2 mg: INTRAMUSCULAR | @ 10:00:00 | NDC 00409128331

## 2013-03-29 MED ADMIN — diphenhydrAMINE (BENADRYL) injection 50 mg: INTRAVENOUS | @ 08:00:00 | NDC 63323066401

## 2013-03-29 MED ADMIN — sodium chloride 0.9 % bolus infusion 1,000 mL: INTRAVENOUS | @ 06:00:00 | NDC 00409798309

## 2013-03-29 MED ADMIN — sodium polystyrene (KAYEXALATE) 15 gram/60 mL oral suspension 30 g: ORAL | @ 10:00:00 | NDC 46287000660

## 2013-03-29 MED ADMIN — HYDROmorphone (PF) (DILAUDID) injection 2 mg: INTRAVENOUS | @ 07:00:00 | NDC 00409128331

## 2013-03-29 NOTE — ED Notes (Signed)
Transport came to take pt to floor pt noted not to be in room. Medicine still sitting in room, pt not present. Oncoming rn informed

## 2013-03-29 NOTE — H&P (Signed)
CHIEF COMPLAINT: Joint pain, nausea and vomiting for one day.    HISTORY OF PRESENT ILLNESS: A 32 year old African-American male with past  medical history of hypertension, end stage renal disease on hemodialysis,  sickle cell disease, sickle cell anemia, presented with joint and back pain  for one day. He describes this pain as boring in nature, 8/10 in severity,  nonradiating; associated with nausea and many episodes of nonbloody  vomiting. He denies any chest pain, shortness of breath, blurry vision and  headache.    PAST MEDICAL HISTORY: Hypertension, sickle cell disease, sickle cell  anemia, end stage renal disease on hemodialysis.    PAST SURGICAL HISTORY: Bilateral hip replacement.    SOCIAL HISTORY: Smokes 5 to 6 cigarettes per day for the last 10 years.  Denies any alcohol or IV drug use.    FAMILY HISTORY: Mother had hypertension. Father had sickle cell disease.  Both the parents are deceased..    ALLERGIES: NO KNOWN DRUG ALLERGIES.    HOME MEDICATIONS:  1. Lisinopril.  2. Norvasc.  3. Folic acid.  4. Hydroxyurea.  5. Dilaudid.    REVIEW OF SYSTEMS  HEENT: No headaches. No new changes in vision, no sore throat.  CARDIORESPIRATORY: No chest pain, no shortness of breath. Intermittent  productive cough with white mucus sputum. No palpitations.  GASTROINTESTINAL: No abdominal pain. Persistent nausea and vomiting.  GENITOURINARY: No urinary complaints.  MUSCULOSKELETAL: Joint pain all over  VASCULAR: No active bleeding.  NEUROPSYCHIATRIC: Normal mood.  ENDOCRINE: No increased urination, thirst, heat or cold intolerance.    PHYSICAL EXAMINATION  GENERAL APPEARANCE: The patient does not appear to be in distress, sitting  in bed, comfortably.  VITAL SIGNS: Temperature 98.1 degrees Fahrenheit, heart rate 85/minute,  respiratory rate 18/minute, blood pressure 184/87, oxygen saturation 98% on  room air.  SKIN: Intact with no laceration or bruises.  HEENT: Head normocephalic, atraumatic. Pupils are equal and  reactive to  light. Extraocular muscles are intact. No pallor or icterus. No ear or nose  discharge. Oral mucosa appears dry.  NECK: Supple, with no lymphadenopathy, thyromegaly, carotid bruit or JVD.  CHEST: Lungs clear to auscultate bilaterally.  HEART: Sounds S1, S2 positive. Regular rate, rhythm. No rub, gallop,  murmur.  ABDOMEN: Bowel sounds present. Soft, nontender, nondistended.  EXTREMITIES: Peripheral pulses +2, +2 pedal edema. Chronic skin changes due  to venous stasis.  NEUROLOGIC: Alert, oriented x3. No focal neurological deficit.  MUSCULOSKELETAL: Decreased range of motion in major joints, secondary to  pain.  VASCULAR: No active bleeding sites.    LABORATORY DATA: Sodium 131, potassium 5.9, chloride 97, bicarbonate 24,  anion gap 16, blood glucose 156, BUN 70, creatinine 10.20, calcium 8.3,  total bilirubin 0.5, total protein 10, albumin 2.7, ALT 18, AST 20,  alkaline phosphatase 162. WBC 5, hemoglobin 8.5, hematocrit 25.6, platelets  123, ________ 1.4.    IMPRESSION: A 32 year old African-American male with past medical history  of    1. Sickle cell disease.  2. Sickle cell anemia.  3. Hypertension.  4. End stage renal disease on hemodialysis.    Presented with ________, back pain and persistent vomiting, likely having  another vaso occlusive episode.    TREATMENT PLAN:  1. Sickle cell crisis. Hydrate with normal saline cautiously, adequate pain  control with Dilaudid. Check CBC to follow up on H and H. Resume folic  acid.  2. Hypertension. Resume home medications.  3. End-stage renal disease. Nephrology consult in morning.  4. Hyperkalemia. Give Kayexalate 30 gram.  Follow up on EKG for any changes  related to hyperkalemia.  5. Hyponatremia. Likely due to dehydration. Will give IV fluids and follow  up sodium in the evening.        Vonda Antigua MD        Dictated by: Trudee Grip, MD Phylisha Dix]  Job#: [[id number]]  DD: [03/29/2013 06:09 A]  DT: [03/29/2013 07:07 A]  Transcribed by: [wmx]    cc:             Trudee Grip, MD Karren Burly

## 2013-03-29 NOTE — ED Notes (Signed)
Pt a&ox3, ambulates with cane with steady gait.

## 2013-03-29 NOTE — ED Provider Notes (Signed)
Patient is a 32 y.o. male presenting with vomiting, sputum production, and joint pain. The history is provided by the patient and medical records.   Vomiting   This is a chronic problem. The current episode started 12 to 24 hours ago. The problem occurs 2 to 4 times per day. The problem has not changed since onset.The emesis has an appearance of stomach contents. There has been no fever. Associated symptoms include arthralgias and cough. Pertinent negatives include no chills, no fever, no sweats, no abdominal pain, no diarrhea, no headaches, no myalgias, no URI and no headaches. His past medical history is significant for DM. His pertinent negatives include no inflammatory bowel disease, no short gut syndrome, no bowel resection, no malabsorption and no gastric bypass.   Cough with sputum  Associated symptoms include vomiting. Pertinent negatives include no chills, no sweats, no headaches and no myalgias.   Joint Pain          Past Medical History   Diagnosis Date   ??? ESRD on hemodialysis      MWF   ??? Other ill-defined conditions      sickle cell   ??? Sickle cell anemia    ??? Hypertension    ??? ESRD (end stage renal disease) on dialysis 07/06/2012   ??? HTN (hypertension) 07/06/2012        Past Surgical History   Procedure Laterality Date   ??? Hx orthopaedic       rt hip    ??? Hx vascular access       av  fistula right arm.   ??? Hx orthopaedic       RIGHT HIP   ??? Hx vascular access       old avg left arm, avf right arm         History reviewed. No pertinent family history.     History     Social History   ??? Marital Status: SINGLE     Spouse Name: N/A     Number of Children: N/A   ??? Years of Education: N/A     Occupational History   ??? Not on file.     Social History Main Topics   ??? Smoking status: Current Every Day Smoker -- 0.25 packs/day for 10 years   ??? Smokeless tobacco: Not on file   ??? Alcohol Use: No   ??? Drug Use: No   ??? Sexually Active: Not on file     Other Topics Concern   ??? Not on file     Social History Narrative     ** Merged History Encounter **                       ALLERGIES: Review of patient's allergies indicates no known allergies.      Review of Systems   Constitutional: Negative for fever and chills.   Respiratory: Positive for cough and sputum production.    Gastrointestinal: Positive for vomiting. Negative for abdominal pain and diarrhea.   Musculoskeletal: Positive for joint pain and arthralgias. Negative for myalgias.   Neurological: Negative for headaches.   All other systems reviewed and are negative.        Filed Vitals:    03/29/13 0047 03/29/13 0109   BP: 188/81    Temp: 98.1 ??F (36.7 ??C)    Resp: 16    Height: 5\' 5"  (1.651 m)    Weight: 86.183 kg (190 lb)    SpO2: 97% 94%  Physical Exam   Nursing note and vitals reviewed.  Constitutional: He is oriented to person, place, and time. He appears well-developed and well-nourished.   Chronically ill appearing.   HENT:   Head: Normocephalic and atraumatic.   Mucous membranes profoundly dry    Eyes: Conjunctivae and EOM are normal. Pupils are equal, round, and reactive to light. Right eye exhibits no discharge. Left eye exhibits no discharge. No scleral icterus.   Neck: Normal range of motion. Neck supple. No JVD present. No tracheal deviation present. No thyromegaly present.   Cardiovascular: Normal rate, regular rhythm, normal heart sounds and intact distal pulses.  Exam reveals no gallop and no friction rub.    No murmur heard.  Pulmonary/Chest: Effort normal and breath sounds normal. No stridor. No respiratory distress. He has no wheezes. He has no rales. He exhibits no tenderness.   Abdominal: Soft. He exhibits no distension. There is no tenderness. There is no rebound.   Musculoskeletal: Normal range of motion. He exhibits no edema and no tenderness.   Lymphadenopathy:     He has no cervical adenopathy.   Neurological: He is alert and oriented to person, place, and time. He has normal reflexes. He displays normal reflexes. No cranial nerve deficit. He  exhibits normal muscle tone. Coordination normal.   Skin: Skin is dry. No rash noted. No erythema. No pallor.   Psychiatric: He has a normal mood and affect. His behavior is normal. Judgment and thought content normal.        MDM     Differential Diagnosis; Clinical Impression; Plan:     Patient with known sickle cell anemia and htn, with chronic renal insufficiency presents with nausea and vomiting x two days unable to tolerate po pain medicines, endorses symptom complex in chronic pattern for this patient appears dehydrated. Objective data demonstrate patient quite dehydrated consistent with pre-renal azotemia and requiring parenteral antiemetics and pain medicines. Pt admitted to medicine team for hydration and iv medicines.      Procedures

## 2013-03-29 NOTE — Progress Notes (Signed)
Pt did not return to room and after being called in waiting area and outside ER bay doors pt. Is presumed to have eloped.  Dr Fredrich Romans informed and address recorded as [facility] so police were not informed.  PIV resumed to still be in place

## 2013-03-29 NOTE — ED Notes (Signed)
meds given and iv fluids infusing pt asking for 4mg  of dilaudid to be given and 50mg  of benadryl. Informed pt that md presribed as per [facility]

## 2013-03-29 NOTE — ED Notes (Signed)
Pt states he is having body aches especially to lower limbs. Has a history of sickle cell. Pt has a subclavian line in place. Waiting for md exam.

## 2013-04-25 NOTE — Progress Notes (Signed)
5/8/14Transition Care Note: called to the emergency dept. For th patient the patient has had multiple visits to the emergency dept and frequently gives the wrong name , wrong address, and wrong SSN . [employer] officer Chilton Si spoke with the pt for identification purposes the pt was unable to provide ID except expired [place] driver's license pt had nothing to document his SSN,. Dr. Fredrich Romans spoke with the pt and agreed to treat the pt , the patient left without notifying anyone in the ED. Pt refused to sign the discharge instructions when he was here. I went to get the d/c instructions and the pt left.

## 2013-04-25 NOTE — ED Provider Notes (Signed)
Patient is a 32 y.o. male presenting with back pain. The history is provided by the patient.   Back Pain   This is a chronic problem. The problem has not changed since onset.The pain is associated with no known injury. The pain is present in the lumbar spine. The pain does not radiate. The pain is at a severity of 10/10. The patient is experiencing no pain. Pertinent negatives include no numbness.        Past Medical History   Diagnosis Date   ??? Other ill-defined conditions      sickle cell disease   ??? Other ill-defined conditions      Rt hip AVN   ??? ESRD on hemodialysis      MWF   ??? Other ill-defined conditions      sickle cell   ??? Sickle cell anemia    ??? Hypertension    ??? ESRD (end stage renal disease) on dialysis 07/06/2012   ??? HTN (hypertension) 07/06/2012   ??? Sickle cell anemia         Past Surgical History   Procedure Laterality Date   ??? Hx orthopaedic  2012     Rt hip decompression   ??? Hx orthopaedic       rt hip    ??? Hx vascular access       av  fistula right arm.   ??? Hx orthopaedic       RIGHT HIP   ??? Hx vascular access       old avg left arm, avf right arm         Family History   Problem Relation Age of Onset   ??? Sickle Cell Anemia Sister         History     Social History   ??? Marital Status: SINGLE     Spouse Name: N/A     Number of Children: N/A   ??? Years of Education: N/A     Occupational History   ??? Not on file.     Social History Main Topics   ??? Smoking status: Current Every Day Smoker -- 0.25 packs/day for 10 years   ??? Smokeless tobacco: Not on file   ??? Alcohol Use: No   ??? Drug Use: No   ??? Sexually Active: Not on file     Other Topics Concern   ??? Not on file     Social History Narrative    ** Merged History Encounter **         ** Merged History Encounter **                       ALLERGIES: Review of patient's allergies indicates no known allergies.      Review of Systems   Musculoskeletal: Positive for back pain.   Neurological: Negative for numbness.   All other systems reviewed and are negative.         Filed Vitals:    04/25/13 1200   BP: 187/81   Pulse: 80   Temp: 97.8 ??F (36.6 ??C)   Resp: 16   Height: 5\' 5"  (1.651 m)   Weight: 86.183 kg (190 lb)   SpO2: 97%            Physical Exam   Nursing note and vitals reviewed.  Constitutional: He is oriented to person, place, and time. He appears well-developed and well-nourished. No distress.   HENT:   Head: Normocephalic and atraumatic.  Eyes: Conjunctivae and EOM are normal. Pupils are equal, round, and reactive to light.   Neck: Normal range of motion. Neck supple. No JVD present. No thyromegaly present.   Cardiovascular: Normal rate, regular rhythm and normal heart sounds.  Exam reveals no friction rub.    No murmur heard.  Pulmonary/Chest: Effort normal and breath sounds normal. No stridor. No respiratory distress. He has no wheezes. He has no rales.   Abdominal: Soft. Bowel sounds are normal. He exhibits no distension. There is no tenderness. There is no rebound and no guarding.   Musculoskeletal: Normal range of motion. He exhibits no edema and no tenderness.   Neurological: He is alert and oriented to person, place, and time. No cranial nerve deficit. Coordination normal.   Skin: Skin is warm and dry. No rash noted. He is not diaphoretic. No erythema. No pallor.   Psychiatric: He has a normal mood and affect. His behavior is normal. Judgment and thought content normal.        MDM     Differential Diagnosis; Clinical Impression; Plan:     Pt appears well and in no apparent distress. Pt agrees that his symptoms are in chronic pattern with no changes today compared to any other day in memory. My judgement at this point is that the probability we will help this patient by continuing to provide opiates the emergency department is vanishingly low. Rather, the probabilty we will help this patient by insisting he seek out patient services appropriately is higher. Similarly the probability we will ultimately harm this patient by enabling him not to seek definitive  care resources is unacceptably high. Thus, in the interesting of improving this patient's health outcome, we plan to discharge him from the hospital and reinforce the importance of out patient follow.      Procedures

## 2013-05-19 NOTE — Progress Notes (Signed)
03/29/13 Transition care note: the pt came to the Ed per previous instruction the police is to be called when the pt came, because of fake names., fake addresses and the desire for narcotics the pt states he is a sickle cell pt , per his PCP in [place] , [place]. The pt does not have Sickle cell. The pt is opiate dependent and uses [facility] as a rouse to obtain narcotics .[employer] spoke with the pt the pt did not have valid ID. The pt was informed he would be treated after his labs returned to check his levels the pt stated he was leaving and refused to sign an AMA form.

## 2013-05-26 ENCOUNTER — Inpatient Hospital Stay
Admit: 2013-05-26 | Discharge: 2013-05-27 | Discharge status: Against Medical Advice | Payer: MEDICARE | Attending: Emergency Medicine | Admitting: Emergency Medicine

## 2013-05-26 DIAGNOSIS — D5700 Hb-SS disease with crisis, unspecified: Secondary | ICD-10-CM

## 2013-05-26 LAB — RETICULOCYTE COUNT: Reticulocyte count: 1.6 % (ref 0.7–2.1)

## 2013-05-26 MED ADMIN — HYDROmorphone (PF) (DILAUDID) injection 2 mg: INTRAVENOUS | NDC 00409128331

## 2013-05-26 MED ADMIN — ondansetron (ZOFRAN ODT) tablet 8 mg: ORAL | NDC 68462015740

## 2013-05-26 MED ADMIN — sodium chloride 0.9 % bolus infusion 500 mL: INTRAVENOUS | NDC 00409798303

## 2013-05-26 MED ADMIN — diphenhydrAMINE (BENADRYL) injection 25 mg: INTRAVENOUS | NDC 63323066401

## 2013-05-26 MED FILL — DIPHENHYDRAMINE HCL 50 MG/ML IJ SOLN: 50 mg/mL | INTRAMUSCULAR | Qty: 1

## 2013-05-26 MED FILL — SODIUM CHLORIDE 0.9 % IV: INTRAVENOUS | Qty: 500

## 2013-05-26 MED FILL — ONDANSETRON 4 MG TAB, RAPID DISSOLVE: 4 mg | ORAL | Qty: 2

## 2013-05-26 MED FILL — HYDROMORPHONE (PF) 1 MG/ML IJ SOLN: 1 mg/mL | INTRAMUSCULAR | Qty: 2

## 2013-05-26 NOTE — ED Notes (Signed)
Assumed care of pt from triage.  Pt presents to ED with chief complaint of generalized pain.  Pt is A&O x 4.  Pt states that he thinks he is having a sickle cell crisis.  Pt states that he started with a cough 1 week ago.  Pt states that he started with joint pain 4 days ago, and that he started with nausea/vomiting 2 days ago.  Pt denies any other symptoms at this time.    Pt resting comfortably on the stretcher in a position of comfort.  Call bell within reach.  Side rails x 2.  Cardiac monitor x 3.  Stretcher locked in the lowest position.  Will continue to monitor.

## 2013-05-26 NOTE — ED Notes (Signed)
Spoke with Dr. Tawanna Cooler about pt's request for 4 mg IV Dilaudid.  Dr. Tawanna Cooler was made aware and stated that he ordered the pt 4 mg Dilaudid PO.  Pt made aware of MD's orders.  Pt is upset, stating, "Well if that's the case then I am leaving AMA."  Went to speak with Dr. Tawanna Cooler again and Dr. Tawanna Cooler had stepped off the floor.  Pt made aware.  Pt states, "Well if he doesn't change in it in 10 minutes then I am leaving AMA."  Pt made aware of the risks of leaving AMA.

## 2013-05-26 NOTE — ED Provider Notes (Signed)
HPI Comments: 32 y.o. male with hx significant for sickle cell anemia presents ambulatory to [facility] ED with cc of 9/10 generalized arthralgias x yesterday morning. Pt reports associated generalized back pain, states these Sx's resemble typical sickle cell crisis. Pt also reports nausea and vomiting x 3 days, with last episode of vomiting in ED waiting room, and productive cough with brown sputum. Pt states he takes Dilaudid at home for pain, but has been unable to tolerate any PO intake secondary to vomiting. Pt reports hx of ESRD which has required dialysis in the past, denies being on dialysis currently. Pt notes baseline leg swelling, denies being on dialysis currently. Pt denies any fever.    PMHx significant for: sickle cell anemia, HTN, ESRD  PSHx significant for: R hip decompression  Social hx: smoker    There are no other complaints, changes or physical findings at this time.   Written by Marcell Anger, ED Scribe, as dictated by Brigid Re, MD.    The history is provided by the patient.        Past Medical History   Diagnosis Date   ??? Other ill-defined conditions      sickle cell disease   ??? Other ill-defined conditions      Rt hip AVN   ??? ESRD on hemodialysis      MWF   ??? Other ill-defined conditions      sickle cell   ??? Sickle cell anemia    ??? Hypertension    ??? ESRD (end stage renal disease) on dialysis 07/06/2012   ??? HTN (hypertension) 07/06/2012   ??? Sickle cell anemia         Past Surgical History   Procedure Laterality Date   ??? Hx orthopaedic  2012     Rt hip decompression   ??? Hx orthopaedic       rt hip    ??? Hx vascular access       av  fistula right arm.   ??? Hx orthopaedic       RIGHT HIP   ??? Hx vascular access       old avg left arm, avf right arm         Family History   Problem Relation Age of Onset   ??? Sickle Cell Anemia Sister         History     Social History   ??? Marital Status: SINGLE     Spouse Name: N/A     Number of Children: N/A   ??? Years of Education: N/A     Occupational History   ??? Not  on file.     Social History Main Topics   ??? Smoking status: Current Every Day Smoker -- 0.25 packs/day for 10 years   ??? Smokeless tobacco: Not on file   ??? Alcohol Use: No   ??? Drug Use: No   ??? Sexually Active: Not on file     Other Topics Concern   ??? Not on file     Social History Narrative    ** Merged History Encounter **         ** Merged History Encounter **                       ALLERGIES: Review of patient's allergies indicates no known allergies.      Review of Systems   Constitutional: Negative.  Negative for fever, chills and appetite change.   HENT: Negative.  Negative for congestion and neck stiffness.    Eyes: Negative.  Negative for visual disturbance.   Respiratory: Positive for cough. Negative for shortness of breath and wheezing.    Cardiovascular: Positive for leg swelling (see HPI). Negative for chest pain and palpitations.   Gastrointestinal: Positive for nausea and vomiting. Negative for abdominal pain.   Genitourinary: Negative.  Negative for dysuria, urgency and frequency.   Musculoskeletal: Positive for back pain (generalized) and arthralgias (generalized). Negative for myalgias and joint swelling.   Skin: Negative.  Negative for rash.   Neurological: Negative.  Negative for dizziness, syncope, weakness and headaches.   Hematological: Negative for adenopathy.   Psychiatric/Behavioral: Negative for behavioral problems and dysphoric mood.   All other systems reviewed and are negative.        Filed Vitals:    05/26/13 1603   BP: 159/87   Pulse: 91   Temp: 98.7 ??F (37.1 ??C)   Resp: 16   Height: 5\' 5"  (1.651 m)   Weight: 115 kg (253 lb 8.5 oz)   SpO2: 92%            Physical Exam   Nursing note and vitals reviewed.  Constitutional: He is oriented to person, place, and time. He appears well-developed and well-nourished. No distress.   HENT:   Head: Normocephalic and atraumatic.   Mouth/Throat: Oropharynx is clear and moist.   Eyes: Conjunctivae and EOM are normal. Pupils are equal, round, and  reactive to light. No scleral icterus.   Neck: Normal range of motion. Neck supple.   Cardiovascular: Regular rhythm.  Tachycardia present.  Exam reveals no gallop.    No murmur heard.  Pulmonary/Chest: Effort normal. No stridor. No respiratory distress. He has no wheezes. He has no rales.   Vascular access port in L chest wall   Abdominal: Soft. Bowel sounds are normal. He exhibits no distension and no mass. There is tenderness (mild, diffuse). There is no rebound and no guarding.   Musculoskeletal: Normal range of motion. He exhibits edema (3+ to BLE).   Lymphadenopathy:     He has no cervical adenopathy.   Neurological: He is alert and oriented to person, place, and time. No cranial nerve deficit. Coordination normal.   Skin: Skin is warm and dry. No rash noted. No erythema.   Psychiatric: He has a normal mood and affect.   Written by Marcell Anger, ED Scribe, as dictated by Brigid Re, MD.      MDM     Differential Diagnosis; Clinical Impression; Plan:     DDx: sickle cell crisis, gastroenteritis, metabolic abnormality, PNA    9:20 PM  Pt has elevated creatinine, as well as low hgb. Will transfuse; likely needs dialysis. I am suspicious of pt's history, based on review of previous visits both in [place] and [facility] facilities. There has been speculation that he may not actually have SSD. Nonetheless, he needs admission for his anemia and renal failure.      Amount and/or Complexity of Data Reviewed:   Clinical lab tests:  Ordered and reviewed  Tests in the radiology section of CPT??:  Ordered and reviewed   Discuss the patient with another provider:  Yes (Hospitalist)  Progress:   Patient progress:  Stable      Procedures    CONSULT NOTE:   9:01 PM  Brigid Re, MD spoke with Cyndie Chime,   Specialty: Hospitalist  Discussed pt's hx, disposition, and available diagnostic and imaging results. Reviewed care  plans. Consultant will evaluate pt for admission.  Written by Marcell Anger, ED Scribe,  as dictated by Brigid Re, MD.    9:01 PM  Patient is being admitted to the hospital by Dr. Skeet Latch.  The results of their tests and reasons for their admission have been discussed with them and/or available family.  They convey agreement and understanding for the need to be admitted and for their admission diagnosis.  Consultation has been made with the inpatient physician specialist for hospitalization.  Written by Marcell Anger, ED Scribe, as dictated by Brigid Re, MD.

## 2013-05-26 NOTE — ED Notes (Signed)
Spoke with Dr. Tawanna Cooler in regards to pt's demand.  Dr. Tawanna Cooler stated that the pt would be receiving PO Dilaudid.  Pt made aware of MD order.  Pt states "I am leaving.  I don't understand why I can't have IV Dilaudid."  Pt again made aware of risks of signing out AMA.  AMA form signed by pt, witnessed by RN.

## 2013-05-26 NOTE — H&P (Signed)
Hospitalist Admit Note    NAME: Tyler Ballard   DOB:  [birth year]   MRN:  [id number]     Date/Time:  05/26/2013 10:52 PM    Patient PCP: No PCP    _______________________________________________________________________   Assessment/Plan:  1.  Sickle cell crisis.  -  Continue dilaudid prn  -  Check hemoglobin electrophoresis  -  Continue hydroxyurea  2.  CKD stage 5.  No dialysis since January while hospitalized in [place].  Still has dialysis catheter in left upper chest.  -  Nephrology consult  -  Continue nephrovite, phoslo  3.  HTN.  -  Continue norvasc and lisinopril  4.  DM2.  On no meds.  -  2000 cal diet  -  SSi  5.  Full code.  Family would make medical decisions if necessary.       Subjective:   CHIEF COMPLAINT:  pain    HISTORY OF PRESENT ILLNESS:     Tyler Ballard is a 32 y.o.  African American male who is admitted for sickle cell crisis.     He had developed 9/10 generalized pain on 05/25/13, especially in his back, which was typical for his sickle cell crisis pain.  He has had nausea and vomiting for three days and was unable to tolerate his oral dilaudid.      In the ED, he was afebrile and had a normal WBC count with no bands.  He was tachycardic to 109.  He was treated with a couple of doses of IV dilaudid.  His hemoglobin was 6.6, down from 8.5 on March 29, 2013 in [place].  He has had no overt bleeding.  He was ordered to be transfused one unit of PRBCs.    He was on hemodialysis in the past and was last dialyzed in January, 2014 during a hospital stay in [place].  He states that outpatient dialysis could not be performed due to lack of insurance.  He states he now has both medicaid and medicare.    We were asked to admit for work up and evaluation of the above problems.     Past Medical History   Diagnosis Date   ??? Other ill-defined conditions      sickle cell disease    ??? Other ill-defined conditions      Rt hip AVN   ??? Other ill-defined conditions      sickle cell   ??? Sickle cell anemia    ??? Hypertension    ??? HTN (hypertension) 07/06/2012   ??? Sickle cell anemia    ??? Chronic kidney disease (CKD), stage V       Past Surgical History   Procedure Laterality Date   ??? Hx orthopaedic  2012     Rt hip decompression   ??? Hx orthopaedic       rt hip    ??? Hx vascular access       av  fistula right arm.   ??? Hx orthopaedic       RIGHT HIP   ??? Hx vascular access       old avg left arm, avf right arm     History   Substance Use Topics   ??? Smoking status: Current Every Day Smoker -- 0.25 packs/day for 10 years   ??? Smokeless tobacco: Not on file   ??? Alcohol Use: No      Family History   Problem Relation Age of Onset   ??? Sickle Cell Anemia Sister    ???  Cancer Mother    ??? Hypertension Mother    ??? Hypertension Father      No Known Allergies     Prior to Admission medications    Medication Sig Start Date End Date Taking? Authorizing Provider   diphenhydrAMINE (BENADRYL) 25 mg capsule Take 50 mg by mouth every four (4) hours as needed.   Yes Phys Other, MD   amLODIPine (NORVASC) 5 mg tablet Take 5 mg by mouth daily.   Yes Phys Other, MD   lisinopril (PRINIVIL, ZESTRIL) 10 mg tablet Take 10 mg by mouth daily.   Yes Phys Other, MD   hydroxyurea (HYDREA) 500 mg capsule Take 500 mg by mouth two (2) times a day.   Yes Phys Other, MD   HYDROmorphone (DILAUDID) 8 mg tablet Take 8 mg by mouth every four (4) hours as needed for Pain.   Yes Phys Other, MD   diphenhydrAMINE (BENADRYL) 50 mg tablet Take 50 mg by mouth every eight (8) hours as needed for Sleep.   Yes Phys Other, MD   ondansetron (ZOFRAN ODT) 8 mg disintegrating tablet Take 1 Tab by mouth every eight (8) hours as needed for Nausea. 07/05/12  Yes Gaspar Bidding, MD   calcium acetate (PHOSLO) 667 mg cap Take 2 Caps by mouth three (3) times daily (with meals).   Yes Phys Other, MD   b complex-vitamin c-folic acid 0.8 mg (NEPHRO-VITE) 0.8 mg Tab  tablet Take 1 Tab by mouth daily.   Yes Phys Other, MD   HYDROmorphone (DILAUDID) 4 mg tablet Take 8 mg by mouth every two (2) hours as needed.   Yes Phys Other, MD   promethazine (PHENERGAN) 25 mg tablet Take 25 mg by mouth every four (4) hours as needed.   Yes Phys Other, MD   folic acid 1 mg Tab 0.5 mg, multivitamin, stress formula Tab 1 Tab Take 1 Dose by mouth daily.   Yes Phys Other, MD     REVIEW OF SYSTEMS:  See HPI for details  General: negative for fever, chills, sweats, weakness, weight loss  Eyes: negative for blurred vision, eye pain, loss of vision, diplopia  Ear Nose and Throat: negative for rhinorrhea, pharyngitis, otalgia, tinnitus, speech or swallowing difficulties  Respiratory:  negative for cough, sputum production, SOB, wheezing, DOE, pleuritic pain  Cardiology:  negative for chest pain, palpitations, orthopnea, PND, edema, syncope   Gastrointestinal: negative for abdominal pain, N/V, dysphagia, change in bowel habits, bleeding  Genitourinary: negative for frequency, urgency, dysuria, hematuria, incontinence  Muskuloskeletal : negative for arthralgia, myalgia  Hematology: negative for easy bruising, bleeding, lymphadenopathy  Dermatological: negative for rash, ulceration, mole change, new lesion  Endocrine: negative for hot flashes or polydipsia  Neurological: negative for headache, dizziness, confusion, focal weakness, paresthesia, memory loss, gait disturbance  Psychological: negative for anxiety, depression, agitation    Objective:   VITALS:    Visit Vitals   Item Reading   ??? BP 151/79   ??? Pulse 103   ??? Temp 98.7 ??F (37.1 ??C)   ??? Resp 12   ??? Ht 5\' 5"  (1.651 m)   ??? Wt 115 kg (253 lb 8.5 oz)   ??? BMI 42.19 kg/m2   ??? SpO2 97%     PHYSICAL EXAM:     GENERAL:    WD y   WN    Cachectic    Thin    Obese y   Disheveled    Ill Appearing Critically n   Ill Appearing  Chronically y   Acute Distress n   Other      HEENT:    NC/AT/EOMI y   PERRLA y   Conjunctivae Pink    Conjunctivae Pale    Moist Mucosa y    Dry Mucosa    Hearing intact to voice y   Other      NECK:    Supple y   Masses n   Thyroid Tender n   Other Trachea midline                  RESPIRATORY:    CTA bilaterally WITHOUT wheezing/rhonchi/rales or crackles y   Wheezing    Rhonchi    Crackles    Use of accessory muscles n   Other      CARDIAC:    regular rate and rhythm No murmurs/rubs/gallops y   Murmur    Rubs    Gallops    Rate Regular/Irregular    Carotid Bruit Left/Right    Lower Extremity Edema y   JVP     Other      ABDOMEN:    soft/non distended non tender +bowel sounds no HSM y   Rigid    Tenderness    Hepatomegaly    Splenomegaly    Distended n   Increased girth due to habitus y   Normal/Hyper/Hypo Active Bowel Sounds normal   Other      SKIN / MUSCULOSKELETAL:    Rashes Venous stasis   Ecchymosis    Ulcers    Tight to palpitation Yes, legs.   Turgor Good/Poor good   Cyanosis/Clubbing n   Amputation(s) n   Other      NEUROLOGY:    cranial nerves II-XII grossly intact y   Cranial Nerve Deficit    Facial Droop n   Slurred Speech n   Aphasia n   Strength Normal y   Weakness    Meningismus/Kernig's Sign/ Merchandiser, retail y   Other      PSYCHIATRIC:    AAOx3 in no acute distress y   Insight Poor    Insight Good y   Alert and Oriented to Person     Alert and Oriented to Place    Alert and Oriented toTime    Depressed    Anxious n   Agitated n   Lethargic n   Stuporous n   Sedated n   Other      ________________________________________________________________________  Care Plan discussed with:    Comments   Patient y    Writer:      ________________________________________________________________________  Recommended Disposition:   Home with Family y   HH/PT/OT/RN    SNF/LTC    [facility]    ________________________________________________________________________  Code Status:  Full Code y   DNR/DNI    ________________________________________________________________________  TOTAL TIME: 60      Comments   >50% of visit spent in counseling and coordination of care       Critical Care Provided     Minutes non procedure based  ________________________________________________________________________  Lorra Hals, MD      Procedures: see electronic medical records for all procedures/Xrays and details which were not copied into this note but were reviewed prior to creation of Plan.    LAB DATA REVIEWED:  Recent Results (from the past 24 hour(s))   CBC WITH AUTOMATED DIFF    Collection Time     05/26/13  7:37 PM       Result Value Range    WBC 4.5  4.1 - 11.1 K/uL    RBC 2.28 (*) 4.10 - 5.70 M/uL    HGB 6.6 (*) 12.1 - 17.0 g/dL    HCT 16.1 (*) 09.6 - 50.3 %    MCV 92.5  80.0 - 99.0 FL    MCH 28.9  26.0 - 34.0 PG    MCHC 31.3  30.0 - 36.5 g/dL    RDW 04.5 (*) 40.9 - 14.5 %    PLATELET 91 (*) 150 - 400 K/uL    NEUTROPHILS 76 (*) 32 - 75 %    LYMPHOCYTES 15  12 - 49 %    MONOCYTES 7  5 - 13 %    EOSINOPHILS 2  0 - 7 %    BASOPHILS 0  0 - 1 %    ABS. NEUTROPHILS 3.4  1.8 - 8.0 K/UL    ABS. LYMPHOCYTES 0.7 (*) 0.8 - 3.5 K/UL    ABS. MONOCYTES 0.3  0.0 - 1.0 K/UL    ABS. EOSINOPHILS 0.1  0.0 - 0.4 K/UL    ABS. BASOPHILS 0.0  0.0 - 0.1 K/UL    PLATELET COMMENTS DECREASED PLATELETS      RBC COMMENTS NORMOCYTIC, NORMOCHROMIC     METABOLIC PANEL, COMPREHENSIVE    Collection Time     05/26/13  7:37 PM       Result Value Range    Sodium 137  136 - 145 mmol/L    Potassium 4.8  3.5 - 5.1 mmol/L    Chloride 105  97 - 108 mmol/L    CO2 22  21 - 32 mmol/L    Anion gap 10  5 - 15 mmol/L    Glucose 115 (*) 65 - 100 mg/dL    BUN 73 (*) 6 - 20 MG/DL    Creatinine 8.11 (*) 0.45 - 1.15 MG/DL    BUN/Creatinine ratio 10 (*) 12 - 20      GFR est AA 10 (*) >60 ml/min/1.10m2    GFR est non-AA 9 (*) >60 ml/min/1.65m2    Calcium 7.7 (*) 8.5 - 10.1 MG/DL    Bilirubin, total 0.3  0.2 - 1.0 MG/DL    ALT 20  12 - 78 U/L    AST 31  15 - 37 U/L    Alk. phosphatase 116  45 - 117 U/L    Protein, total 9.0 (*) 6.4 - 8.2 g/dL    Albumin 2.3 (*)  3.5 - 5.0 g/dL    Globulin 6.7 (*) 2.0 - 4.0 g/dL    A-G Ratio 0.3 (*) 1.1 - 2.2     RETICULOCYTE COUNT    Collection Time     05/26/13  7:37 PM       Result Value Range    Reticulocyte count 1.6  0.7 - 2.1 %   TYPE & CROSSMATCH    Collection Time     05/26/13  9:20 PM       Result Value Range    Crossmatch Expiration 05/29/2013      ABO/Rh(D) B POSITIVE      Antibody screen NEG

## 2013-05-26 NOTE — ED Notes (Signed)
Pt sleeping at this time, lightly snoring.  Pt is easily arousable.      Pt resting on the stretcher at this time in a position of comfort.  Call bell within reach.  Side rails x 2.  Cardiac monitor x 3.  Stretcher locked in the lowest position.  Pt in no acute distress at this the time.  Will continue to monitor.

## 2013-05-26 NOTE — ED Notes (Addendum)
Pt stated that he needed Benadryl with Dilaudid.  RN asked why the pt needed Benadryl.  Pt stated that he starts "itching" when he is given IV Dilaudid.  RN stated that the pt didn't have Dilaudid listed as an allergy.  Pt stated that "I am not allergic to it.  I just need Benadryl with my Dilaudid."  After administering the IV Benadryl, pt stated, "It's good you only gave me 25 mg.  If you give me 50 mg, then I have to wait 6 hours for more Benadryl."

## 2013-05-26 NOTE — ED Notes (Signed)
While medicating pt, during the Zofran ODT administration, pt forcefully stated, "Where is my Dilaudid?!"  Explained to pt the reason for administering Zofran first.

## 2013-05-27 LAB — CBC WITH AUTOMATED DIFF
ABS. BASOPHILS: 0 10*3/uL (ref 0.0–0.1)
ABS. EOSINOPHILS: 0.1 10*3/uL (ref 0.0–0.4)
ABS. LYMPHOCYTES: 0.7 10*3/uL — ABNORMAL LOW (ref 0.8–3.5)
ABS. MONOCYTES: 0.3 10*3/uL (ref 0.0–1.0)
ABS. NEUTROPHILS: 3.4 10*3/uL (ref 1.8–8.0)
BASOPHILS: 0 % (ref 0–1)
EOSINOPHILS: 2 % (ref 0–7)
HCT: 21.1 % — ABNORMAL LOW (ref 36.6–50.3)
HGB: 6.6 g/dL — ABNORMAL LOW (ref 12.1–17.0)
LYMPHOCYTES: 15 % (ref 12–49)
MCH: 28.9 PG (ref 26.0–34.0)
MCHC: 31.3 g/dL (ref 30.0–36.5)
MCV: 92.5 FL (ref 80.0–99.0)
MONOCYTES: 7 % (ref 5–13)
NEUTROPHILS: 76 % — ABNORMAL HIGH (ref 32–75)
PLATELET COMMENTS: DECREASED
PLATELET: 91 10*3/uL — ABNORMAL LOW (ref 150–400)
RBC: 2.28 M/uL — ABNORMAL LOW (ref 4.10–5.70)
RDW: 19.2 % — ABNORMAL HIGH (ref 11.5–14.5)
WBC: 4.5 10*3/uL (ref 4.1–11.1)

## 2013-05-27 LAB — METABOLIC PANEL, COMPREHENSIVE
A-G Ratio: 0.3 — ABNORMAL LOW (ref 1.1–2.2)
ALT (SGPT): 20 U/L (ref 12–78)
AST (SGOT): 31 U/L (ref 15–37)
Albumin: 2.3 g/dL — ABNORMAL LOW (ref 3.5–5.0)
Alk. phosphatase: 116 U/L (ref 45–117)
Anion gap: 10 mmol/L (ref 5–15)
BUN/Creatinine ratio: 10 — ABNORMAL LOW (ref 12–20)
BUN: 73 MG/DL — ABNORMAL HIGH (ref 6–20)
Bilirubin, total: 0.3 MG/DL (ref 0.2–1.0)
CO2: 22 mmol/L (ref 21–32)
Calcium: 7.7 MG/DL — ABNORMAL LOW (ref 8.5–10.1)
Chloride: 105 mmol/L (ref 97–108)
Creatinine: 7.46 MG/DL — ABNORMAL HIGH (ref 0.45–1.15)
GFR est AA: 10 mL/min/{1.73_m2} — ABNORMAL LOW (ref >60)
GFR est non-AA: 9 mL/min/{1.73_m2} — ABNORMAL LOW (ref >60)
Globulin: 6.7 g/dL — ABNORMAL HIGH (ref 2.0–4.0)
Glucose: 115 mg/dL — ABNORMAL HIGH (ref 65–100)
Potassium: 4.8 mmol/L (ref 3.5–5.1)
Protein, total: 9 g/dL — ABNORMAL HIGH (ref 6.4–8.2)
Sodium: 137 mmol/L (ref 136–145)

## 2013-05-27 LAB — TYPE + CROSSMATCH
ABO/Rh(D): B POS
Antibody screen: NEGATIVE
Physician instructions: NEGATIVE
Unit division: 0

## 2013-05-27 MED ADMIN — diphenhydrAMINE (BENADRYL) injection 25 mg: INTRAVENOUS | @ 03:00:00 | NDC 63323066401

## 2013-05-27 MED ADMIN — HYDROmorphone (PF) (DILAUDID) injection 2 mg: INTRAVENOUS | @ 01:00:00 | NDC 00409128331

## 2013-05-27 MED ADMIN — diphenhydrAMINE (BENADRYL) injection 25 mg: INTRAVENOUS | @ 01:00:00 | NDC 63323066401

## 2013-05-27 MED FILL — DIPHENHYDRAMINE HCL 50 MG/ML IJ SOLN: 50 mg/mL | INTRAMUSCULAR | Qty: 1

## 2013-05-27 MED FILL — HYDROMORPHONE (PF) 1 MG/ML IJ SOLN: 1 mg/mL | INTRAMUSCULAR | Qty: 2

## 2013-07-12 LAB — METABOLIC PANEL, COMPREHENSIVE
A-G Ratio: 0.3 — ABNORMAL LOW (ref 1.1–2.2)
ALT (SGPT): 11 U/L — ABNORMAL LOW (ref 12–78)
AST (SGOT): 14 U/L — ABNORMAL LOW (ref 15–37)
Albumin: 2.4 g/dL — ABNORMAL LOW (ref 3.5–5.0)
Alk. phosphatase: 85 U/L (ref 45–117)
Anion gap: 11 mmol/L (ref 5–15)
BUN/Creatinine ratio: 6 — ABNORMAL LOW (ref 12–20)
BUN: 38 MG/DL — ABNORMAL HIGH (ref 6–20)
Bilirubin, total: 0.4 MG/DL (ref 0.2–1.0)
CO2: 24 mmol/L (ref 21–32)
Calcium: 8 MG/DL — ABNORMAL LOW (ref 8.5–10.1)
Chloride: 101 mmol/L (ref 97–108)
Creatinine: 6.53 MG/DL — ABNORMAL HIGH (ref 0.45–1.15)
GFR est AA: 12 mL/min/{1.73_m2} — ABNORMAL LOW (ref >60)
GFR est non-AA: 10 mL/min/{1.73_m2} — ABNORMAL LOW (ref >60)
Globulin: 7.7 g/dL — ABNORMAL HIGH (ref 2.0–4.0)
Glucose: 144 mg/dL — ABNORMAL HIGH (ref 65–100)
Potassium: 3.6 mmol/L (ref 3.5–5.1)
Protein, total: 10.1 g/dL — ABNORMAL HIGH (ref 6.4–8.2)
Sodium: 136 mmol/L (ref 136–145)

## 2013-07-12 LAB — CBC WITH AUTOMATED DIFF
ABS. BASOPHILS: 0 10*3/uL (ref 0.0–0.1)
ABS. EOSINOPHILS: 0.1 10*3/uL (ref 0.0–0.4)
ABS. LYMPHOCYTES: 1.2 10*3/uL (ref 0.8–3.5)
ABS. MONOCYTES: 0.6 10*3/uL (ref 0.0–1.0)
ABS. NEUTROPHILS: 3.3 10*3/uL (ref 1.8–8.0)
BASOPHILS: 0 % (ref 0–1)
EOSINOPHILS: 2 % (ref 0–7)
HCT: 27.7 % — ABNORMAL LOW (ref 36.6–50.3)
HGB: 8.6 g/dL — ABNORMAL LOW (ref 12.1–17.0)
LYMPHOCYTES: 23 % (ref 12–49)
MCH: 27.9 PG (ref 26.0–34.0)
MCHC: 31 g/dL (ref 30.0–36.5)
MCV: 89.9 FL (ref 80.0–99.0)
MONOCYTES: 11 % (ref 5–13)
NEUTROPHILS: 64 % (ref 32–75)
PLATELET: 196 10*3/uL (ref 150–400)
RBC: 3.08 M/uL — ABNORMAL LOW (ref 4.10–5.70)
RDW: 17.1 % — ABNORMAL HIGH (ref 11.5–14.5)
WBC: 5.1 10*3/uL (ref 4.1–11.1)

## 2013-07-12 LAB — PROTHROMBIN TIME + INR
INR: 1.4 — ABNORMAL HIGH (ref 0.9–1.1)
Prothrombin time: 14.6 s — ABNORMAL HIGH (ref 9.4–11.7)

## 2013-07-12 LAB — RETICULOCYTE COUNT: Reticulocyte count: 1.5 % (ref 0.7–2.1)

## 2013-07-12 LAB — PTT: aPTT: 29.7 s (ref 22.1–30.0)

## 2013-07-12 LAB — AMYLASE: Amylase: 88 U/L (ref 25–115)

## 2013-07-12 LAB — LIPASE: Lipase: 178 U/L (ref 73–393)

## 2013-07-12 LAB — PHOSPHORUS: Phosphorus: 5.2 MG/DL — ABNORMAL HIGH (ref 2.5–4.9)

## 2013-07-12 LAB — MAGNESIUM: Magnesium: 1.8 mg/dL (ref 1.6–2.4)

## 2013-07-12 MED ADMIN — sodium chloride 0.9 % bolus infusion 1,000 mL: INTRAVENOUS | @ 08:00:00 | NDC 00409798309

## 2013-07-12 MED ADMIN — HYDROmorphone (PF) (DILAUDID) injection 1 mg: INTRAVENOUS | @ 08:00:00 | NDC 00409255201

## 2013-07-12 MED ADMIN — diphenhydrAMINE (BENADRYL) injection 25 mg: INTRAVENOUS | @ 08:00:00 | NDC 63323066401

## 2013-07-12 MED ADMIN — ketorolac (TORADOL) injection 30 mg: INTRAVENOUS | @ 08:00:00 | NDC 00409379501

## 2013-07-12 MED ADMIN — HYDROmorphone (PF) (DILAUDID) injection 1 mg: INTRAVENOUS | @ 09:00:00 | NDC 00409255201

## 2013-07-12 MED ADMIN — ondansetron (ZOFRAN) injection 8 mg: INTRAVENOUS | @ 08:00:00 | NDC 23155037831

## 2013-07-12 NOTE — ED Notes (Signed)
Pt presents with complaints of nausea vomiting, cough, and sickle cell pain in joints and back.

## 2013-07-12 NOTE — ED Notes (Signed)
Patient became frustrated with physician. Patient raising voice. Patient states he would like to leave AMA. Patient signed out AMA. Dr Mindi Slicker notified. Patient ambulatory off unit, patient states he is "calling a ride" and that we needed to "stop asking questions". Patient raised voice at charge nurse. HPD aware of patient.

## 2013-07-12 NOTE — ED Notes (Signed)
Upon cleaning patient's room this RN found emesis bag filled with water

## 2013-07-12 NOTE — ED Provider Notes (Signed)
HPI Comments: The patient is a 32 year old male with past medical history significant for sickle cell anemia, chronic renal insufficiency, hyperkalemia, gastroparesis and chronic pain who presents to the ED with the complaint of intractable nausea or vomiting for 24 hours, accompanied by body aches especially localized to the lower extremity and back, cough with yellow sputum production and shortness of breath with coughing. The patient denies fever, sore throat, blurry vision, headache, neck stiffness, chest pain, diarrhea, constipation, dysuria, dizziness, weakness, numbness, blurred vision, skin rash, sick contacts and recent travel.    The patient smokes approximately one pack per week and denies any drugs or alcohol consumption.    Patient is a 32 y.o. male presenting with vomiting.   Vomiting          Past Medical History   Diagnosis Date   ??? Other ill-defined conditions      sickle cell disease   ??? Other ill-defined conditions      Rt hip AVN   ??? Other ill-defined conditions      sickle cell   ??? Sickle cell anemia    ??? Hypertension    ??? HTN (hypertension) 07/06/2012   ??? Sickle cell anemia    ??? Chronic kidney disease (CKD), stage V         Past Surgical History   Procedure Laterality Date   ??? Hx orthopaedic  2012     Rt hip decompression   ??? Hx orthopaedic       rt hip    ??? Hx vascular access       av  fistula right arm.   ??? Hx orthopaedic       RIGHT HIP   ??? Hx vascular access       old avg left arm, avf right arm         Family History   Problem Relation Age of Onset   ??? Sickle Cell Anemia Sister    ??? Cancer Mother    ??? Hypertension Mother    ??? Hypertension Father         History     Social History   ??? Marital Status: SINGLE     Spouse Name: N/A     Number of Children: N/A   ??? Years of Education: N/A     Occupational History   ??? Not on file.     Social History Main Topics   ??? Smoking status: Current Every Day Smoker -- 0.25 packs/day for 10 years   ??? Smokeless tobacco: Not on file   ??? Alcohol Use: No   ??? Drug  Use: No   ??? Sexually Active: Not on file     Other Topics Concern   ??? Not on file     Social History Narrative    ** Merged History Encounter **         ** Merged History Encounter **                       ALLERGIES: Review of patient's allergies indicates no known allergies.      Review of Systems   Gastrointestinal: Positive for vomiting.   All other systems reviewed and are negative.        Filed Vitals:    07/12/13 0343   BP: 158/83   Pulse: 115   Temp: 98.1 ??F (36.7 ??C)   Resp: 18   Height: 5\' 5"  (1.651 m)   Weight: 81.647 kg (180 lb)  SpO2: 99%            Physical Exam     CONSTITUTIONAL: Well-appearing; well-nourished; in no apparent distress  HEAD: Normocephalic; atraumatic  EYES: PERRL; EOM intact; conjunctiva and sclera are clear bilaterally.  ENT: No rhinorrhea; normal pharynx with no tonsillar hypertrophy; mucous membranes pink/moist, no erythema, no exudate.  NECK: Supple; non-tender; no cervical lymphadenopathy  CARD: Normal S1, S2; no murmurs, rubs, or gallops. Increased Regular rate and rhythm.  RESP: Normal respiratory effort; breath sounds clear and equal bilaterally; no wheezes, rhonchi, or rales.  ABD: Normal bowel sounds; non-distended; non-tender; no palpable organomegaly, no masses, no bruits.  Back Exam: Normal inspection; no vertebral point tenderness, no CVA tenderness. Normal range of motion.  EXT: Normal ROM in all four extremities; non-tender to palpation; no swelling or deformity; distal pulses are normal, no edema.  SKIN: Warm; dry; no rash.  NEURO:Alert and oriented x 3, coherent, NII-XII grossly intact, sensory and motor are non-focal.      MDM     Differential Diagnosis; Clinical Impression; Plan:     Assessment: 32 year old male with sickle cell disease and joint pain withausea and vomiting  which is typical for his plan crisis rule out pneumonia/sickle cell pain crisis    Plan: labs/IV fluid/analgesia/antiemetic/chest x-ray/serial exams/ Monitor and Reevaluate.      Amount and/or  Complexity of Data Reviewed:   Clinical lab tests:  Ordered and reviewed  Tests in the radiology section of CPT??:  Ordered and reviewed  Tests in the medicine section of the CPT??:  Reviewed and ordered  Discussion of test results with the performing providers:  Yes   Decide to obtain previous medical records or to obtain history from someone other than the patient:  Yes   Obtain history from someone other than the patient:  Yes   Review and summarize past medical records:  Yes   Discuss the patient with another provider:  Yes   Independant visualization of image, tracing, or specimen:  Yes  Risk of Significant Complications, Morbidity, and/or Mortality:   Presenting problems:  Moderate  Diagnostic procedures:  Moderate  Management options:  Moderate  Progress:   Patient progress:  Stable      Procedures    XRAY INTERPRETATION (ED MD)  Chest Xray  No acute process seen. Normal heart size. No bony abnormalities. No infiltrate.  Kathrene Alu, MD 4:18 AM      5:52 AM  Pt expresses wish to leave the emergency department against medical advice.  Patient requested more pain medication and was told that he would not giving anymore narcotics because there was no indication for his discomfort and he was not in sickle cell crisis. This is a chronic pain manner with drug-seeking behavior.     Adverse problems related to this decision including bodily harm, permanent disability and death have been discussed with the patient and/or family.  The patient and/or family do not appear intoxicated and appear to be capable of understanding and making a decision of such gravity.  The patient and/or family express understanding of the possible adverse outcomes of their decision and still express the wish to leave against medical advice.  The patient was given follow up and return instructions and the available laboratory tests and x-rays were discussed.  The patient was given the opportunity to ask questions.  The patient the patient  signed out AGAINST MEDICAL ADVICE and requested 2 cans of ginger ale and crackers that he drank as he  was leaving the ED to the waiting room.    Marland Kitchen   Marland Kitchen

## 2013-07-16 NOTE — ED Notes (Addendum)
Accessed red port on Hickman catheter to draw labs.  Blood returns freely without difficulty.  Line flushed and clamped to maintain positive pressure.  Accessed purple port, blood returns freely, purple used for IV fluids and medications.  Dressing last changed on 7/25.  No obvious signs of infection noted, bio patch present under clear dressing.  tegaderm dressing starting to pull away from chest, further secured with tape.

## 2013-07-16 NOTE — ED Notes (Signed)
Patient has received pain medication.  Pain 7/10 at this time.

## 2013-07-16 NOTE — ED Notes (Signed)
Reports having joint pain and back ache.  Reports this to be typical sickle cell pain.  Reports having double lumen hickman catheter to left upper chest that was placed in January 2014.  Dressing dated 07/12/2013

## 2013-07-16 NOTE — ED Notes (Signed)
Portable xray being completed to verify placement.

## 2013-07-16 NOTE — ED Notes (Signed)
ERMD evaluating

## 2013-07-16 NOTE — ED Provider Notes (Addendum)
Patient is a 32 y.o. male presenting with sickle cell disease and vomiting. The history is provided by the patient. No language interpreter was used.   Sickle Cell Crisis   This is a new problem. The current episode started 12 to 24 hours ago. The problem has not changed since onset.The problem occurs constantly. Patient reports not work related injury.The pain is associated with no known injury. Pertinent negatives include no chest pain, no fever, no headaches and no dysuria.   Vomiting   Pertinent negatives include no fever, no headaches and no headaches.        Past Medical History   Diagnosis Date   ??? Ill-defined condition      sickle cell anemia   ??? Hypertension         Past Surgical History   Procedure Laterality Date   ??? Hx orthopaedic       right hip surgery         History reviewed. No pertinent family history.     History     Social History   ??? Marital Status: SINGLE     Spouse Name: N/A     Number of Children: N/A   ??? Years of Education: N/A     Occupational History   ??? Not on file.     Social History Main Topics   ??? Smoking status: Current Every Day Smoker   ??? Smokeless tobacco: Not on file   ??? Alcohol Use: No   ??? Drug Use: No   ??? Sexually Active: Not on file     Other Topics Concern   ??? Not on file     Social History Narrative   ??? No narrative on file                  ALLERGIES: Review of patient's allergies indicates no known allergies.      Review of Systems   Constitutional: Negative for fever.   HENT: Negative for congestion, sore throat and neck pain.    Eyes: Negative for visual disturbance.   Respiratory: Negative for shortness of breath.    Cardiovascular: Negative for chest pain.   Gastrointestinal: Positive for vomiting. Negative for nausea.   Endocrine: Negative for polyuria.   Genitourinary: Negative for dysuria.   Musculoskeletal: Negative for back pain.   Skin: Negative for rash.   Allergic/Immunologic: Negative for immunocompromised state.   Neurological: Negative for headaches.    Psychiatric/Behavioral: Negative.  Negative for agitation.       Filed Vitals:    07/16/13 2009   BP: 171/93   Pulse: 122   Temp: 98.7 ??F (37.1 ??C)   Resp: 18   Height: 5\' 5"  (1.651 m)   Weight: 81.647 kg (180 lb)   SpO2: 96%            Physical Exam   Nursing note and vitals reviewed.  Constitutional: He is oriented to person, place, and time. He appears well-developed and well-nourished.   HENT:   Head: Atraumatic.   Eyes: EOM are normal. Pupils are equal, round, and reactive to light.   Neck: Neck supple.   Cardiovascular: Normal rate and regular rhythm.  Exam reveals no gallop and no friction rub.    No murmur heard.  Pulmonary/Chest: Effort normal and breath sounds normal. No respiratory distress. He has no wheezes. He has no rales.   Abdominal: Soft. Bowel sounds are normal.   Musculoskeletal: He exhibits no edema and no tenderness.   Neurological:  He is alert and oriented to person, place, and time.   Skin: Skin is warm and dry.   Psychiatric: He has a normal mood and affect.        MDM    Procedures

## 2013-07-16 NOTE — ED Notes (Signed)
Pt requesting something to drink -- Given cranberry juice.

## 2013-07-16 NOTE — ED Notes (Signed)
Verbal shift change report given to Valentina Shaggy Aydin Hink, RN (oncoming nurse) by R. Thurmond Butts, RN (offgoing nurse).  Report given with SBAR, ED Summary, MAR and Recent Results.

## 2013-07-17 LAB — CBC WITH AUTOMATED DIFF
ABS. BASOPHILS: 0 10*3/uL (ref 0.0–0.1)
ABS. EOSINOPHILS: 0.2 10*3/uL (ref 0.0–0.4)
ABS. LYMPHOCYTES: 0.9 10*3/uL (ref 0.8–3.5)
ABS. MONOCYTES: 0.5 10*3/uL (ref 0.0–1.0)
ABS. NEUTROPHILS: 4.2 10*3/uL (ref 1.8–8.0)
BASOPHILS: 0 % (ref 0–1)
EOSINOPHILS: 3 % (ref 0–7)
HCT: 29.5 % — ABNORMAL LOW (ref 36.6–50.3)
HGB: 9.1 g/dL — ABNORMAL LOW (ref 12.1–17.0)
LYMPHOCYTES: 15 % (ref 12–49)
MCH: 28.6 PG (ref 26.0–34.0)
MCHC: 30.8 g/dL (ref 30.0–36.5)
MCV: 92.8 FL (ref 80.0–99.0)
MONOCYTES: 9 % (ref 5–13)
NEUTROPHILS: 73 % (ref 32–75)
PLATELET: 192 10*3/uL (ref 150–400)
RBC: 3.18 M/uL — ABNORMAL LOW (ref 4.10–5.70)
RDW: 17.8 % — ABNORMAL HIGH (ref 11.5–14.5)
WBC: 5.7 10*3/uL (ref 4.1–11.1)

## 2013-07-17 LAB — METABOLIC PANEL, BASIC
Anion gap: 6 mmol/L (ref 5–15)
BUN/Creatinine ratio: 6 — ABNORMAL LOW (ref 12–20)
BUN: 20 MG/DL (ref 6–20)
CO2: 30 mmol/L (ref 21–32)
Calcium: 8.3 MG/DL — ABNORMAL LOW (ref 8.5–10.1)
Chloride: 98 mmol/L (ref 97–108)
Creatinine: 3.35 MG/DL — ABNORMAL HIGH (ref 0.45–1.15)
GFR est AA: 26 mL/min/{1.73_m2} — ABNORMAL LOW (ref >60)
GFR est non-AA: 21 mL/min/{1.73_m2} — ABNORMAL LOW (ref >60)
Glucose: 109 mg/dL — ABNORMAL HIGH (ref 65–100)
Potassium: 3.3 mmol/L — ABNORMAL LOW (ref 3.5–5.1)
Sodium: 134 mmol/L — ABNORMAL LOW (ref 136–145)

## 2013-07-17 MED ADMIN — sodium chloride 0.9 % bolus infusion 1,000 mL: INTRAVENOUS | @ 03:00:00 | NDC 00409798309

## 2013-07-17 MED ADMIN — diphenhydrAMINE (BENADRYL) injection 25 mg: INTRAVENOUS | @ 04:00:00 | NDC 63323066401

## 2013-07-17 MED ADMIN — cefTRIAXone (ROCEPHIN) 1 g in 0.9% sodium chloride (MBP/ADV) 50 mL MBP: INTRAVENOUS | @ 04:00:00 | NDC 00781320885

## 2013-07-17 MED ADMIN — promethazine (PHENERGAN) injection 25 mg: INTRAVENOUS | @ 03:00:00 | NDC 00641092821

## 2013-07-17 MED ADMIN — azithromycin (ZITHROMAX) tablet 500 mg: ORAL | @ 04:00:00 | NDC 68084065611

## 2013-07-17 MED ADMIN — HYDROmorphone (PF) (DILAUDID) injection 2 mg: INTRAVENOUS | @ 04:00:00 | NDC 00409128331

## 2013-07-17 NOTE — ED Notes (Signed)
Discharge instructions were given to the patient by RN. The patient left the Emergency Department ambulatory with 1 prescription to go home.    The patient was encouraged to call or return to the ED for further issues or problems.    The patient voiced understanding of discharge instructions, all questions were answered.

## 2013-08-02 ENCOUNTER — Inpatient Hospital Stay
Admit: 2013-08-02 | Discharge: 2013-08-06 | Discharge status: Against Medical Advice | Payer: MEDICARE | Attending: Specialist | Admitting: Specialist

## 2013-08-02 DIAGNOSIS — N179 Acute kidney failure, unspecified: Secondary | ICD-10-CM

## 2013-08-02 LAB — METABOLIC PANEL, BASIC
Anion gap: 10 mmol/L (ref 3.0–18)
BUN/Creatinine ratio: 7 — ABNORMAL LOW (ref 12–20)
BUN: 51 MG/DL — ABNORMAL HIGH (ref 7.0–18)
CO2: 25 mmol/L (ref 21–32)
Calcium: 8.1 MG/DL — ABNORMAL LOW (ref 8.5–10.1)
Chloride: 97 mmol/L — ABNORMAL LOW (ref 100–108)
Creatinine: 7.29 MG/DL — ABNORMAL HIGH (ref 0.6–1.3)
GFR est AA: 11 mL/min/{1.73_m2} — ABNORMAL LOW (ref >60)
GFR est non-AA: 9 mL/min/{1.73_m2} — ABNORMAL LOW (ref >60)
Glucose: 114 mg/dL — ABNORMAL HIGH (ref 74–99)
Potassium: 4.5 mmol/L (ref 3.5–5.5)
Sodium: 132 mmol/L — ABNORMAL LOW (ref 136–145)

## 2013-08-02 LAB — CBC WITH AUTOMATED DIFF
ABS. BASOPHILS: 0 10*3/uL (ref 0.0–0.06)
ABS. EOSINOPHILS: 0.2 10*3/uL (ref 0.0–0.4)
ABS. LYMPHOCYTES: 0.7 10*3/uL — ABNORMAL LOW (ref 0.9–3.6)
ABS. MONOCYTES: 0.3 10*3/uL (ref 0.05–1.2)
ABS. NEUTROPHILS: 3.3 10*3/uL (ref 1.8–8.0)
BASOPHILS: 0 % (ref 0–2)
EOSINOPHILS: 4 % (ref 0–5)
HCT: 26.4 % — ABNORMAL LOW (ref 36.0–48.0)
HGB: 8.6 g/dL — ABNORMAL LOW (ref 13.0–16.0)
LYMPHOCYTES: 15 % — ABNORMAL LOW (ref 21–52)
MCH: 28.5 PG (ref 24.0–34.0)
MCHC: 32.6 g/dL (ref 31.0–37.0)
MCV: 87.4 FL (ref 74.0–97.0)
MONOCYTES: 6 % (ref 3–10)
MPV: 9.2 FL (ref 9.2–11.8)
NEUTROPHILS: 75 % — ABNORMAL HIGH (ref 40–73)
PLATELET: 147 10*3/uL (ref 135–420)
RBC: 3.02 M/uL — ABNORMAL LOW (ref 4.70–5.50)
RDW: 17.2 % — ABNORMAL HIGH (ref 11.6–14.5)
WBC: 4.5 10*3/uL — ABNORMAL LOW (ref 4.6–13.2)

## 2013-08-02 LAB — RETICULOCYTE COUNT: Reticulocyte count: 1 % (ref 0.5–2.3)

## 2013-08-02 MED ADMIN — diphenhydrAMINE (BENADRYL) injection 25 mg: INTRAVENOUS | @ 23:00:00 | NDC 00641037621

## 2013-08-02 MED ADMIN — 0.9% sodium chloride infusion: INTRAVENOUS | NDC 00409798309

## 2013-08-02 MED ADMIN — HYDROmorphone (DILAUDID) 2 mg/mL injection: INTRAVENOUS | @ 23:00:00 | NDC 00641012121

## 2013-08-02 MED ADMIN — HYDROmorphone (DILAUDID) 2 mg/mL injection: INTRAVENOUS | @ 21:00:00 | NDC 00641012121

## 2013-08-02 MED ADMIN — sodium chloride 0.9 % bolus infusion 500 mL: INTRAVENOUS | @ 20:00:00 | NDC 00409798303

## 2013-08-02 MED ADMIN — ondansetron (ZOFRAN) injection 4 mg: INTRAVENOUS | @ 20:00:00 | NDC 23155037831

## 2013-08-02 MED ADMIN — promethazine (PHENERGAN) injection 25 mg: INTRAVENOUS | @ 21:00:00 | NDC 00641092821

## 2013-08-02 MED ADMIN — diphenhydrAMINE (BENADRYL) injection 25 mg: INTRAVENOUS | @ 20:00:00 | NDC 00641037621

## 2013-08-02 MED ADMIN — 0.9% sodium chloride infusion 2,000 mL: INTRAVENOUS | @ 21:00:00 | NDC 00409798309

## 2013-08-02 MED ADMIN — HYDROmorphone (DILAUDID) injection 2 mg: INTRAVENOUS | @ 20:00:00 | NDC 00641012121

## 2013-08-02 NOTE — ED Notes (Signed)
TRANSFER - OUT REPORT:    Verbal report given to Mequisha, RN(name) on [employer]  being transferred to 2104(unit) for routine progression of care       Report consisted of patient???s Situation, Background, Assessment and   Recommendations(SBAR).     Information from the following report(s) SBAR, ED Summary and MAR was reviewed with the receiving nurse.    Opportunity for questions and clarification was provided.

## 2013-08-02 NOTE — ED Notes (Signed)
"  I'm having a sickle cell crisis. I'm hurting in all my joints. I'm nauseated and I've been vomiting."

## 2013-08-02 NOTE — Progress Notes (Signed)
Attempted to complete a full head to toe skin assessment on patient. Patient stated: "I don't need that when I go to the hospital they always try to do that."

## 2013-08-02 NOTE — H&P (Signed)
Medicine History and Physical    Patient: Tyler Ballard   Age:  32 y.o.    Chief Complaint:   Chief Complaint   Patient presents with   ??? Sickle Cell Crisis   ??? Nausea   ??? Vomiting       PCP: None    Code Status:     HPI:   Tyler Ballard is a 32 y.o. year old male who c/o c/o diffuse myalgia arthralgia N/V/abdominal pain x 2 days 9/10,   In ED found to have ARF .    Past Medical History:  Past Medical History   Diagnosis Date   ??? HTN (hypertension)    ??? Sickle cell disease        Past Surgical History:  Right hip replacement  Family History:  Sickle cess disease   Social History:  Admits to daily tobacco use of cigarettes   Denies alcohol and illicit drug use   History     Social History   ??? Marital Status: SINGLE     Spouse Name: N/A     Number of Children: N/A   ??? Years of Education: N/A     Social History Main Topics   ??? Smoking status: Not on file   ??? Smokeless tobacco: Not on file   ??? Alcohol Use: Not on file   ??? Drug Use: Not on file   ??? Sexually Active: Not on file     Other Topics Concern   ??? Not on file     Social History Narrative   ??? No narrative on file       Home Medications:  Prior to Admission medications    Medication Sig Start Date End Date Taking? Authorizing Provider   HYDROmorphone (DILAUDID) 8 mg tablet Take 16 mg by mouth every four (4) hours as needed for Pain.   Yes Phys Other, MD   lisinopril (PRINIVIL, ZESTRIL) 10 mg tablet Take 10 mg by mouth daily.   Yes Phys Other, MD   amLODIPine (NORVASC) 10 mg tablet Take  by mouth daily.   Yes Phys Other, MD   hydroxyurea 200 mg cap Take  by mouth.   Yes Phys Other, MD   folic acid 400 mcg tablet Take 400 mcg by mouth daily.   Yes Phys Other, MD       Allergies:  No Known Allergies    Review of Systems  12 systems reviewed - all negative except for what is noted in HPI.  Constitutional: positive for fatigue and malaise  Eyes: negative  Ears, Nose, Mouth, Throat, and Face: negative  Respiratory: negative  Cardiovascular: negative   Gastrointestinal: positive for nausea, vomiting and abdominal pain  Genitourinary:negative  Integument/Breast: positive for skin lesion(s)  Hematologic/Lymphatic: negative  Musculoskeletal:positive for myalgias and arthralgias  Neurological: negative  Behavioral/Psychiatric: negative  Endocrine: negative  Allergic/Immunologic: negative  remainder of ten point review of systems reviewed with patient and negative    Physical Exam:     Visit Vitals   Item Reading   ??? BP 136/74   ??? Pulse 104   ??? Temp 98.4 ??F (36.9 ??C)   ??? Resp 18   ??? Ht 5\' 5"  (1.651 m)   ??? Wt 81.647 kg (180 lb)   ??? BMI 29.95 kg/m2   ??? SpO2 91%       Physical Exam:  General appearance: Alert, cooperative, no distress, appears stated age  Head: Normocephalic, without obvious abnormality, atraumatic  Neck: Supple, no lymphadenopathy or  thyromegaly, trachea midline  Lungs: Clear to auscultation bilaterally  Heart: Regular rate and rhythm, S1, S2 normal, no murmur, click, rub or gallop  Abdomen: Soft,  tender and not-distended. Bowel sounds normal. No masses,  no organomegaly  Extremities: Extremities normal, atraumatic, no cyanosis or edema  Skin:  lesions  Neurologic: Grossly normal and non focal, normal muscle tone and power, cranial nerves are intact, normal sensationn AOx3    Intake and Output:  Current Shift:     Last three shifts:       Lab/Data Reviewed:  Lab results reviewed. For significant abnormal values and values requiring intervention, see assessment and plan.    Assessment/Plan   Active Problems:    ARF (acute renal failure) (08/02/2013)    sickle cell crisis  Dilaudid prn  IVF  O2NC   Cont outpt Rx    ARF  IVF  Consult nephro    HTN  Adjust outpt Rx    Tobacco  Counseled patient regarding tobacco cessation, educated patient regarding risks including cancer COPD heart and peripheral vascular disease, educated patient regarding Chantix Welbutrin nicotine patches.  Patient understands all was told and answered all questions. Time Spent 12  minutes   Nicotine patch    DVT prophylaxis hep sq  Reason for continued hospitalization arf  Anticipated Date of Discharge: 08/07/13  Anticipated Disposition (home, SNF) : [facility]    Jairo Ben, MD  August 02, 2013    Hospital Medicine  [facility]  Pager: [phone]

## 2013-08-02 NOTE — ED Notes (Signed)
Pt with NP.

## 2013-08-02 NOTE — ED Notes (Signed)
Pt with consult at bedside.

## 2013-08-02 NOTE — ED Notes (Signed)
Pt states pain has gone down to 6/10. Pt still with consult at bedside.

## 2013-08-02 NOTE — ED Provider Notes (Signed)
HPI Comments: 32 year old male to the emergency room with a c/o systemic pain secondary to his sickle cell disease.  Pt states he began hurting two days ago.  He's in town for his mothers funeral, being from [place].   Plans to go back to [place] as soon as the affairs of estate are concluded.   States he had renal failure last year, but his kidney function recovered.      Patient is a 32 y.o. male presenting with sickle cell disease, nausea, and vomiting. The history is provided by the patient. History limited by: No communication barrier.   Sickle Cell Crisis   This is a chronic problem. The current episode started 2 days ago. The problem has not changed since onset.The problem occurs constantly. Patient reports not work related injury.Associated with: Sickle cell disease.   Pain location: Pt complaints of systemic pain.   The pain is moderate. Exacerbated by: activity. The pain is the same all the time.   Nausea   Associated symptoms include arthralgias and myalgias.   Vomiting   Associated symptoms include arthralgias and myalgias.        Past Medical History   Diagnosis Date   ??? HTN (hypertension)    ??? Sickle cell disease         History reviewed. No pertinent past surgical history.      History reviewed. No pertinent family history.     History     Social History   ??? Marital Status: SINGLE     Spouse Name: N/A     Number of Children: N/A   ??? Years of Education: N/A     Occupational History   ??? Not on file.     Social History Main Topics   ??? Smoking status: Not on file   ??? Smokeless tobacco: Not on file   ??? Alcohol Use: Not on file   ??? Drug Use: Not on file   ??? Sexually Active: Not on file     Other Topics Concern   ??? Not on file     Social History Narrative   ??? No narrative on file                  ALLERGIES: Review of patient's allergies indicates no known allergies.      Review of Systems   Constitutional: Negative.    HENT: Negative.    Eyes: Negative.    Respiratory: Negative.    Cardiovascular:  Negative.    Gastrointestinal: Positive for nausea and vomiting.   Endocrine: Negative.    Genitourinary: Negative.    Musculoskeletal: Positive for myalgias and arthralgias.   Skin: Negative.    Allergic/Immunologic: Negative.    Neurological: Negative.    Hematological: Negative.    Psychiatric/Behavioral: Negative.        Filed Vitals:    08/02/13 1436 08/02/13 1607   BP: 154/93 150/71   Pulse: 123    Temp: 98.4 ??F (36.9 ??C)    Resp: 18    Height: 5\' 5"  (1.651 m)    Weight: 81.647 kg (180 lb)    SpO2: 95%             Physical Exam   Nursing note and vitals reviewed.  Constitutional: He is oriented to person, place, and time. He appears well-developed and well-nourished. No distress.   HENT:   Head: Normocephalic and atraumatic.   Eyes: Pupils are equal, round, and reactive to light.   Neck:  Normal range of motion. Neck supple.   Cardiovascular: Normal rate and regular rhythm.    Pulmonary/Chest: Effort normal and breath sounds normal. He has no wheezes. He has no rales.   Abdominal: Soft. Bowel sounds are normal. There is no tenderness. There is no rebound.   Genitourinary:   NE   Musculoskeletal: Normal range of motion.   Neurological: He is alert and oriented to person, place, and time.   Skin: Skin is warm and dry.   Psychiatric: He has a normal mood and affect.        MDM     Amount and/or Complexity of Data Reviewed:   Clinical lab tests:  Ordered and reviewed  Tests in the radiology section of CPT??:  Ordered and reviewed  Risk of Significant Complications, Morbidity, and/or Mortality:   Presenting problems:  Moderate  Diagnostic procedures:  Moderate  Management options:  Moderate  Progress:   Patient progress:  Stable      Procedures    CONSULT:  Spoke to Dr Robinette Haines, who agreed to admit the Pt.  Said he will be down to eval and admit the Pt.  Katherine Roan, NP  5:37 PM    PROGRESS NOTE:  One or more blood pressure readings were noted elevated during the [name] presentation in the emergency department this  date.  This abnormal reading has been cited in the [name] diagnosis, and they have been encouraged to follow up with their primary care physician, or referred to a consultant for further evaluation and treatment.    Katherine Roan, NP   5:38 PM    Diagnosis:   1. Sickle cell crisis    2. Sickle-cell disease with pain    3. Acute renal failure          Disposition:   Admitted - Dr Robinette Haines.      Follow-up Information    Follow up With Details Comments Contact Info    Jairo Ben, MD   [address]  [facility] [place] [address]  [phone]            Patient's Medications   Start Taking    No medications on file   Continue Taking    AMLODIPINE (NORVASC) 10 MG TABLET    Take  by mouth daily.    FOLIC ACID 400 MCG TABLET    Take 400 mcg by mouth daily.    HYDROMORPHONE (DILAUDID) 8 MG TABLET    Take 16 mg by mouth every four (4) hours as needed for Pain.    HYDROXYUREA 200 MG CAP    Take  by mouth.    LISINOPRIL (PRINIVIL, ZESTRIL) 10 MG TABLET    Take 10 mg by mouth daily.   These Medications have changed    No medications on file   Stop Taking    No medications on file

## 2013-08-02 NOTE — Progress Notes (Signed)
Patient complained: "Why were you late with my pain medication? I want to speak to charge nurse." Nurse apologized to patient and explained that charge nurse is unavailable and that I would make every attempt to be on time with his medication.

## 2013-08-02 NOTE — Progress Notes (Signed)
Received patient from emergency department. Oriented patient to unit and room. Bed is locked and in lowest position. Call bell within reach. Patient instructed to let staff know if he needs further assistance.

## 2013-08-02 NOTE — ED Notes (Signed)
Assumed care of pt. Pt c/o N/V for two days, and back and joint pain 9/10 that started yesterday. Pt states hx Sickle Cell Disease. Denies any chest pain/SOB/D/fever.

## 2013-08-02 NOTE — ED Notes (Signed)
Pt states sister will pick him up.

## 2013-08-02 NOTE — ED Notes (Signed)
Portable XR at bedside.

## 2013-08-02 NOTE — ED Provider Notes (Signed)
I was personally available for consultation in the emergency department. I have reviewed the chart prior to the patient's discharge and agree with the documentation recorded by the MLP, including the assessment, treatment plan, and disposition.

## 2013-08-02 NOTE — ED Notes (Signed)
Offered urinal. Pt states he can't go at this time.

## 2013-08-02 NOTE — ED Notes (Signed)
Pt states, "Pain is still there and the Zofran did not work. I usually take 2 of Dilaudid. Tell the PA. I also need Phenergan" PA made aware. New orders in process.

## 2013-08-02 NOTE — Other (Signed)
TRANSFER - IN REPORT:    Verbal report received from Salvatore Marvel, RN (name) on Tyler Ballard  being received from Emergency Department(unit) for routine progression of care      Report consisted of patient???s Situation, Background, Assessment and   Recommendations(SBAR).     Information from the following report(s) SBAR, Kardex, Intake/Output, MAR and Recent Results was reviewed with the receiving nurse.    Opportunity for questions and clarification was provided.      Assessment completed upon patient???s arrival to unit and care assumed.

## 2013-08-03 LAB — VITAMIN B12 & FOLATE
Folate: >24 ng/mL — ABNORMAL HIGH (ref 5.38–24.0)
Vitamin B12: 609 pg/mL (ref 211–911)

## 2013-08-03 LAB — METABOLIC PANEL, COMPREHENSIVE
A-G Ratio: 0.4 — ABNORMAL LOW (ref 0.8–1.7)
ALT (SGPT): 15 U/L (ref 12.0–78.0)
AST (SGOT): 21 U/L (ref 15–37)
Albumin: 2.4 g/dL — ABNORMAL LOW (ref 3.4–5.0)
Alk. phosphatase: 101 U/L (ref 45–117)
Anion gap: 9 mmol/L (ref 3.0–18)
BUN/Creatinine ratio: 7 — ABNORMAL LOW (ref 12–20)
BUN: 50 MG/DL — ABNORMAL HIGH (ref 7.0–18)
Bilirubin, total: 0.5 MG/DL (ref 0.2–1.0)
CO2: 24 mmol/L (ref 21–32)
Calcium: 7.6 MG/DL — ABNORMAL LOW (ref 8.5–10.1)
Chloride: 100 mmol/L (ref 100–108)
Creatinine: 7.24 MG/DL — ABNORMAL HIGH (ref 0.6–1.3)
GFR est AA: 11 mL/min/{1.73_m2} — ABNORMAL LOW (ref >60)
GFR est non-AA: 9 mL/min/{1.73_m2} — ABNORMAL LOW (ref >60)
Globulin: 6.7 g/dL — ABNORMAL HIGH (ref 2.0–4.0)
Glucose: 64 mg/dL — ABNORMAL LOW (ref 74–99)
Potassium: 4.7 mmol/L (ref 3.5–5.5)
Protein, total: 9.1 g/dL — ABNORMAL HIGH (ref 6.4–8.2)
Sodium: 133 mmol/L — ABNORMAL LOW (ref 136–145)

## 2013-08-03 LAB — CBC WITH AUTOMATED DIFF
ABS. BASOPHILS: 0 10*3/uL (ref 0.0–0.06)
ABS. EOSINOPHILS: 0.2 10*3/uL (ref 0.0–0.4)
ABS. LYMPHOCYTES: 0.8 10*3/uL — ABNORMAL LOW (ref 0.9–3.6)
ABS. MONOCYTES: 0.4 10*3/uL (ref 0.05–1.2)
ABS. NEUTROPHILS: 1.7 10*3/uL — ABNORMAL LOW (ref 1.8–8.0)
BASOPHILS: 0 % (ref 0–2)
EOSINOPHILS: 6 % — ABNORMAL HIGH (ref 0–5)
HCT: 24.5 % — ABNORMAL LOW (ref 36.0–48.0)
HGB: 8 g/dL — ABNORMAL LOW (ref 13.0–16.0)
LYMPHOCYTES: 25 % (ref 21–52)
MCH: 28.7 PG (ref 24.0–34.0)
MCHC: 32.7 g/dL (ref 31.0–37.0)
MCV: 87.8 FL (ref 74.0–97.0)
MONOCYTES: 14 % — ABNORMAL HIGH (ref 3–10)
MPV: 9.6 FL (ref 9.2–11.8)
NEUTROPHILS: 55 % (ref 40–73)
PLATELET: 147 10*3/uL (ref 135–420)
RBC: 2.79 M/uL — ABNORMAL LOW (ref 4.70–5.50)
RDW: 17.2 % — ABNORMAL HIGH (ref 11.6–14.5)
WBC: 3 10*3/uL — ABNORMAL LOW (ref 4.6–13.2)

## 2013-08-03 LAB — IRON PROFILE
Iron % saturation: 24 %
Iron: 32 ug/dL — ABNORMAL LOW (ref 50–175)
TIBC: 134 ug/dL — ABNORMAL LOW (ref 250–450)

## 2013-08-03 LAB — PTT: aPTT: 32.6 s (ref 24.6–37.7)

## 2013-08-03 LAB — PROTHROMBIN TIME + INR
INR: 1.2 (ref 0.8–1.2)
Prothrombin time: 15.6 s — ABNORMAL HIGH (ref 11.5–15.2)

## 2013-08-03 LAB — PHOSPHORUS: Phosphorus: 4.5 MG/DL (ref 2.5–4.9)

## 2013-08-03 LAB — FERRITIN: Ferritin: 698 NG/ML — ABNORMAL HIGH (ref 8–388)

## 2013-08-03 LAB — RETICULOCYTE COUNT: Reticulocyte count: 1 % (ref 0.5–2.3)

## 2013-08-03 LAB — LACTIC ACID: Lactic acid: 0.6 MMOL/L (ref 0.4–2.0)

## 2013-08-03 LAB — MAGNESIUM: Magnesium: 2.2 mg/dL (ref 1.8–2.4)

## 2013-08-03 LAB — CALCIUM, IONIZED: Ionized Calcium: 1.05 MMOL/L — ABNORMAL LOW (ref 1.12–1.32)

## 2013-08-03 MED ADMIN — amLODIPine (NORVASC) tablet 5 mg: ORAL | @ 13:00:00 | NDC 68084025911

## 2013-08-03 MED ADMIN — HYDROmorphone (DILAUDID) injection 3 mg: INTRAVENOUS | @ 23:00:00 | NDC 00641012121

## 2013-08-03 MED ADMIN — promethazine (PHENERGAN) injection 12.5 mg: INTRAVENOUS | @ 08:00:00 | NDC 00641092821

## 2013-08-03 MED ADMIN — diphenhydrAMINE (BENADRYL) injection 25 mg: INTRAVENOUS | @ 11:00:00 | NDC 00641037621

## 2013-08-03 MED ADMIN — HYDROmorphone (DILAUDID) injection 3 mg: INTRAVENOUS | @ 20:00:00 | NDC 00641012121

## 2013-08-03 MED ADMIN — HYDROmorphone (DILAUDID) injection 3 mg: INTRAVENOUS | @ 05:00:00 | NDC 00641012121

## 2013-08-03 MED ADMIN — promethazine (PHENERGAN) injection 12.5 mg: INTRAVENOUS | @ 14:00:00 | NDC 00641092821

## 2013-08-03 MED ADMIN — 0.9% sodium chloride (MBP/ADV) 0.9 % infusion: @ 10:00:00 | NDC 00338055311

## 2013-08-03 MED ADMIN — heparin (porcine) injection 5,000 Units: SUBCUTANEOUS | @ 01:00:00 | NDC 25021040201

## 2013-08-03 MED ADMIN — diphenhydrAMINE (BENADRYL) injection 25 mg: INTRAVENOUS | @ 05:00:00 | NDC 00641037621

## 2013-08-03 MED ADMIN — HYDROmorphone (DILAUDID) injection 3 mg: INTRAVENOUS | @ 08:00:00 | NDC 00641012121

## 2013-08-03 MED ADMIN — sodium chloride (NS) flush 5-10 mL: INTRAVENOUS | @ 05:00:00 | NDC 87701099893

## 2013-08-03 MED ADMIN — hydroxyurea (HYDREA) chemo cap 500 mg: ORAL | @ 13:00:00 | NDC 68084028411

## 2013-08-03 MED ADMIN — folic acid (FOLVITE) tablet 1 mg: ORAL | @ 13:00:00 | NDC 62584089711

## 2013-08-03 MED ADMIN — promethazine (PHENERGAN) injection 12.5 mg: INTRAVENOUS | @ 20:00:00 | NDC 00641092821

## 2013-08-03 MED ADMIN — diphenhydrAMINE (BENADRYL) injection 25 mg: INTRAVENOUS | @ 17:00:00 | NDC 00641037621

## 2013-08-03 MED ADMIN — HYDROmorphone (DILAUDID) injection 3 mg: INTRAVENOUS | @ 14:00:00 | NDC 00641012121

## 2013-08-03 MED ADMIN — HYDROmorphone (DILAUDID) injection 3 mg: INTRAVENOUS | @ 02:00:00 | NDC 00641012121

## 2013-08-03 MED ADMIN — promethazine (PHENERGAN) injection 12.5 mg: INTRAVENOUS | @ 02:00:00 | NDC 00641092821

## 2013-08-03 MED ADMIN — HYDROmorphone (DILAUDID) injection 3 mg: INTRAVENOUS | @ 11:00:00 | NDC 00641012121

## 2013-08-03 MED ADMIN — heparin (porcine) injection 5,000 Units: SUBCUTANEOUS | @ 13:00:00 | NDC 25021040201

## 2013-08-03 MED ADMIN — HYDROmorphone (DILAUDID) injection 3 mg: INTRAVENOUS | @ 17:00:00 | NDC 00641012121

## 2013-08-03 MED ADMIN — diphenhydrAMINE (BENADRYL) injection 25 mg: INTRAVENOUS | @ 23:00:00 | NDC 00641037621

## 2013-08-03 MED ADMIN — sodium chloride (NS) flush 5-10 mL: INTRAVENOUS | @ 11:00:00 | NDC 87701099893

## 2013-08-03 MED ADMIN — sodium chloride (NS) flush 5-10 mL: INTRAVENOUS | @ 20:00:00 | NDC 87701099893

## 2013-08-03 NOTE — Other (Addendum)
ACUTE HEMODIALYSIS FLOW SHEET    HEMODIALYSIS ORDERS: Physician: Dr. Lockie Mola     Dialyzer:  Revaclear         Duration: 3hrs  BFR: 350   DFR 800   Dialysate:  K+  3       Ca+  2.5   Weight:  102.5  kg    Bed Scale [x]      Unable to Obtain []       UF Goal:  3500       Heparin []   Bolus      Units    []  Hourly       Units    [x]  None   Pre BP: 138/82      Pulse:  88 Temperature:  97.2   Tx: NS           ml/Bolus  Other        []  N/A   Labs: Pre        Post:        [x]  N/A   Additional Orders:           [x]  Time Out/Safety Check  [x]  [facility] Consent Verified     CATHETER ACCESS: [x] N/A   [] Right   [] Left   [] IJ     [] Fem   []  First use X-ray verified     [] Tunnel                []  Non Tunneled   [] No S/S infection  [] Redness  [] Drainage [] Cultured [] Swelling [] Pain   [] Medical Aseptic Prep Utilized   [] Dressing Changed  []  Biopatch  Date:       [] Clotted   [] Patent   Flows: [] Good  [] Poor  [] Reversed   If access problem, Dr. notified: [] Yes    [] N/A  Date:           GRAFT/FISTULA ACCESS:  [] N/A     [x] Right     [] Left     [x] UE     [] LE   [] AVG   [x] AVF        [] Buttonhole    [x] Medical Aseptic Prep Utilized   [x] No S/S infection  [] Redness  [] Drainage [] Cultured [] Swelling [] Pain  Bruit:   [x]  Strong    []  Weak       Thrill :   [x]  Strong    []  Weak       Needle Gauge:15         Length:  1"    If access problem, Dr. notified: [] Yes     [] N/A  Date:             GENERAL ASSESSMENT:   LUNGS:  Sat%           [x]  Clear  []  Coarse  []  Crackles  []  Wheezing        []  Diminished     Location : [] RRL   [] LLL    [] RUL  [] LUL   Cough: [] Productive  [] Dry  [] N/A   Respirations:  [] Easy  [] Labored   Therapy:  [] RA  [x] NC  2       l/min    Mask: [] NRB [] Venti       O2%                  [] Ventilator  [] Intubated  [] Trach   CARDIAC: [x] Regular      []  Irregular   []  Pericardial Rub  []  JVD        []   Monitored  []  N/A  Rhythm:         EDEMA: []  None  [] Generalized  [x]  Pitting []  1    []  2    [x]  3    []  4                  []  Facial  [x]  Pedal  []   UE  [x]  LE   SKIN:   [x]  Warm  []  Hot     []  Cold   [x]  Dry     []  Pale   []  Diaphoretic                  []  Flushed  []  Jaundiced  []  Cyanotic  []  Rash  []  Weeping   LOC:    [x]  Alert      [x] Oriented:    [x]  Person     [x]  Place  [x] Time               []  Confused  []  Lethargic  []  Medicated  []  Non-responsive     GI / ABDOMEN:  []  Flat    []  Distended    [x]  Soft    []  Firm   []   Obese                             []  Diarrhea  [x]  Bowel Sounds  []  Nausea  []  Vomiting      GU / URINE ASSESSMENT:  []  Voiding   [x]  Oliguria  []  Anuria   []   Foley     []  Incontinent    []   Incontinent Brief      []   Bathroom Privileges     PAIN: []  0=None [] 1  [] 2   [] 3   [x] 4   [] 5   [] 6   [] 7   [] 8   [] 9   [] 10            Scale 0-10  Action/Follow Up:         MOBILITY:  [x]  Amb    []  Amb/Assist    []  Bed    []  Wheelchair  []  Stretcher      All Vitals and Treatment Details on Attached Flowsheet       Hospital:  [facility]         Room #      []  1st Time Acute  []  Stat  []  Routine  []  Urgent     []  Acute Room  []   Bedside  []  ICU/CCU  []  ER   Isolation Precautions:  [x]  Dialysis   []  Airborne   []  Contact    []  Reverse   Special Considerations:         []  Blood Consent Verified [] N/A     ALLERGIES:   []  NKA           Code Status:  [x]  Full Code  []  DNR  []  Other           HBsAg ONLY: Date Drawn  08/03/13              [] Negative [] Positive [x] Unknown   HBsAb: Date    drawn        []  Susceptible   []  Immune10 [] Not Drawn      Current Labs:    Date of Labs:                Today [x]   Results for Tyler Ballard, Tyler Ballard (MRN [id number]) as of 08/03/2013 12:13   Ref. Range 08/03/2013 04:35   Ionized Calcium Latest Range: 1.12-1.32 MMOL/L 1.05 (L)   WBC Latest Range: 4.6-13.2 K/uL 3.0 (L)   RBC Latest Range: 4.70-5.50 M/uL 2.79 (L)   HGB Latest Range: 13.0-16.0 g/dL 8.0 (L)   HCT Latest Range: 36.0-48.0 % 24.5 (L)   MCV Latest Range: 74.0-97.0 FL 87.8   MCH Latest Range: 24.0-34.0 PG 28.7   MCHC Latest Range: 31.0-37.0 g/dL  64.4   RDW Latest Range: 11.6-14.5 % 17.2 (H)   PLATELET Latest Range: 135-420 K/uL 147   MPV Latest Range: 9.2-11.8 FL 9.6   NEUTROPHILS Latest Range: 40-73 % 55   LYMPHOCYTES Latest Range: 21-52 % 25   MONOCYTES Latest Range: 3-10 % 14 (H)   EOSINOPHILS Latest Range: 0-5 % 6 (H)   BASOPHILS Latest Range: 0-2 % 0   DF No range found AUTOMATED   ABS. NEUTROPHILS Latest Range: 1.8-8.0 K/UL 1.7 (L)   ABS. LYMPHOCYTES Latest Range: 0.9-3.6 K/UL 0.8 (L)   ABS. MONOCYTES Latest Range: 0.05-1.2 K/UL 0.4   ABS. EOSINOPHILS Latest Range: 0.0-0.4 K/UL 0.2   ABS. BASOPHILS Latest Range: 0.0-0.06 K/UL 0.0   Reticulocyte count Latest Range: 0.5-2.3 % 1.0   INR Latest Range: 0.8-1.2   1.2   Prothrombin time Latest Range: 11.5-15.2 sec 15.6 (H)   aPTT Latest Range: 24.6-37.7 SEC 32.6   Sodium Latest Range: 136-145 mmol/L 133 (L)   Potassium Latest Range: 3.5-5.5 mmol/L 4.7   Chloride Latest Range: 100-108 mmol/L 100   CO2 Latest Range: 21-32 mmol/L 24   Anion gap Latest Range: 3.0-18 mmol/L 9   Glucose Latest Range: 74-99 mg/dL 64 (L)   BUN Latest Range: 7.0-18 MG/DL 50 (H)   Creatinine Latest Range: 0.6-1.3 MG/DL 0.34 (H)   BUN/Creatinine ratio Latest Range: 12-20   7 (L)   Calcium Latest Range: 8.5-10.1 MG/DL 7.6 (L)   Phosphorus Latest Range: 2.5-4.9 MG/DL 4.5   Magnesium Latest Range: 1.8-2.4 mg/dL 2.2   GFR est non-AA Latest Range: >60 ml/min/1.65m2 9 (L)   GFR est AA Latest Range: >60 ml/min/1.27m2 11 (L)   Bilirubin, total Latest Range: 0.2-1.0 MG/DL 0.5   Protein, total Latest Range: 6.4-8.2 g/dL 9.1 (H)   Albumin Latest Range: 3.4-5.0 g/dL 2.4 (L)   Globulin Latest Range: 2.0-4.0 g/dL 6.7 (H)   A-G Ratio Latest Range: 0.8-1.7   0.4 (L)   ALT Latest Range: 12.0-78.0 U/L 15   AST Latest Range: 15-37 U/L 21   Alk. phosphatase Latest Range: 45-117 U/L 101   Lactic acid Latest Range: 0.4-2.0 MMOL/L 0.6   Vitamin B12 Latest Range: 211-911 pg/mL 609   Folate Latest Range: 5.38-24.0 ng/mL >24.0 (H)   Iron Latest Range: 50-175  ug/dL 32 (L)   TIBC Latest Range: 250-450 ug/dL 742 (L)   Iron % saturation No range found 24   Ferritin Latest Range: 8-388 NG/ML 698 (H)  DIET:  [x]  Renal    []  Other     []  NPO     []   Diabetic      PRIMARY NURSE REPORT: First initial/Last name/Title      Pre Dialysis:     Time:        EDUCATION:   [x]  Patient []  Other         Knowledge Basis: [] None [x] Minimal [] Substantial   [x]  Access Care     [x]  S&S of infection     [x]  Fluid Management     [] K+     [x] Procedural    [] Albumin     []  Medications     []  Tx Options     []  Transplant     []  Diet     []  Other   Teaching Tools:  [x]  Explain  []  Demo  []  Handouts []  Video   Inappropriate due to            RO/HEMODIALYSIS MACHINE SAFETY CHECKS ??? Before each treatment:     Machine Number: [x]  Machine/RO #11   (    11       )                                   Alarm Test: [x] Pass           Other:         [x]  RO/Machine Log Complete   Dialysate: pH     7.0  Temp 36.6   [x] Extracorporeal Circuit Tested for integrity   Conductivity: Meter  14  HD Machine 14      CHLORINE TESTING-Before each treatment and every 4 hours    Total Chlorine: [x]  less than 0.1 ppm  Time:     4 Hr Check Time:         (if greater than 0.1 ppm from Primary then every 30 minutes from Secondary)     TREATMENT INITIATION ??? with Dialysis Precautions:   [x]  All Connections Secured                 [x]  Saline Line Double Clamped   [x]  Venous Parameters Set                  [x]  Arterial Parameters Set    [x]  Prime Given  ml  160             [x] Air Foam Detector Engaged      Treatment Initiation Note: Started treatment using rt arm fistula. Vitals stable and pt signed consent. Pt pleasant and talkative.       Medication Dose Volume Route Initials Dialyzer Cleared: []  Good [x]  Fair  []  Poor    Blood processed: 55.9   L  UF Removed  3600   Ml    Post Wt:     kg  POst BP:144/82           Pulse:88       Temperature:97.3                                   Post Tx Vascular Access: AVF/AVG: Bleeding stopped Art  10    min. Ven.   10   Min  Catheter: Locking solution: Heparin 1:1000 Art.      Ven.                                     Post Assessment:                                 Skin:    [x]  Warm  [x]  Dry []  Diaphoretic     []  Flushed  []  Pale []  Cyanotic   [facility] Signatures      Hyman Hopes Title      RN Initials       MCO Time Lungs: [x]  Clear    []  Course  []  Crackles  []  Wheezing       Cardiac: []  Regular   [x]  Irregular   []  Monitor  []  N/A  Rhythm:           Edema:  []  None    []  General     []  Facial   [x]  Pedal    []  UE    [x]  LE       Pain: [] 0=None  [] 1  [] 2   [] 3  [] 4   [x] 5   [] 6   [] 7   [] 8   [] 9   [] 10     Post Treatment Note:   Pt tolerated procedure well.           POST TREATMENT PRIMARY NURSE HANDOFF REPORT:     First initial/Last name/Title         Post Dialysis:   Donald Siva RN    Time:    1515       Abbreviations: AVG-arterial venous graft, AVF-arterial venous fistula, IJ-Internal Jugular, Subcl-Subclavian, Fem-Femoral, Tx-treatment, AP/HR-apical heart rate, DFR-dialysate flow rate, BFR-blood flow rate, AP-arterial pressure, VP-venous pressure, UF-ultrafiltrate, TMP-transmembrane pressure, Ven-Venous, Art-Arterial, RO-Reverse Osmosis

## 2013-08-03 NOTE — Progress Notes (Signed)
Patient has not voided for minimum of 12 hours. Bladder scan revealed 93 ml residual. Dr. Sharion Dove notified foley ordered for urinary retention. Explained to patient the need for foley and order. Patient refused stating: "No I'm not getting no foley."     Further explained to patient that Dr. did not discontinue his Normal saline. Patient stated: "Well I guess I'm going to have to refuse."

## 2013-08-03 NOTE — Progress Notes (Signed)
08/03/13 0640   Genitourinary   Genitourinary (WDL) X   has not voided bladder scan 93 ml

## 2013-08-03 NOTE — Progress Notes (Signed)
Problem: Discharge Planning  Goal: *Discharge to safe environment  Outcome: Progressing Towards Goal  Plan is to discharge to a safe environment

## 2013-08-03 NOTE — Other (Signed)
Bedside and Verbal shift change report given to Lorie Lazaro, RN (oncoming nurse) by Grace A Dawang, RN   (offgoing nurse). Report included the following information SBAR, Kardex, Intake/Output, MAR and Recent Results.

## 2013-08-03 NOTE — Progress Notes (Signed)
Daily Progress Note    Patient: Tyler Ballard   Age:  32 y.o.  DOA: 08/02/2013   Admit Dx / CC: ARF (acute renal failure)  LOS:  LOS: 1 day       Assessment/ Plan:  Hospital Problems Never Reviewed        ICD-9-CM Class Noted POA    ARF (acute renal failure) 584.9  08/02/2013 Unknown            sickle cell crisis   Hgb electrophoresis at [facility] normal per nephro  Patient signed out AMA at [facility] when switching IV dilaudid 3 mg q3h to PO   Dilaudid prn   IVF   O2NC   Cont outpt Rx     ESRD-HD  IVF   Consult nephro     HTN   Adjust outpt Rx     Tobacco   Counseled patient regarding tobacco cessation, educated patient regarding risks including cancer COPD heart and peripheral vascular disease, educated patient regarding Chantix Welbutrin nicotine patches. Patient understands all was told and answered all questions. Time Spent 6 minutes     Nicotine patch   DVT prophylaxis hep sq   Reason for continued hospitalization arf   Anticipated Date of Discharge: 08/07/13   Anticipated Disposition (home, SNF) : HH    Subjective:   Good po bm   No uo   Diffuse pain       Review of Systems -   General ROS: denies fatigue malaise   Psychological ROS: denies anxiety depression   Ophthalmic ROS: denies vision changes   ENT ROS: denies sinusitis throat discomfort  Hematological and Lymphatic ROS: denies easy bruising  Respiratory ROS: denies cough, shortness of breath, or wheezing  Cardiovascular ROS: denies chest pain or dyspnea on exertion  Gastrointestinal ROS: denies abdominal pain, nausea vomiting diarrhea melena   Genito-Urinary ROS: denies dysuria, hesitancy, or hematuria  Musculoskeletal ROS: denies myalgias, arthralgias   Neurological ROS: denies focal changes of weakness, sensory changes  Dermatological ROS: denies hives       Objective:   Patient Vitals for the past 24 hrs:   Temp Pulse Resp BP SpO2   08/03/13 0716 98 ??F (36.7 ??C) 88 16 138/83 mmHg 98 %   08/03/13 0429 97.9 ??F (36.6 ??C) 89 18 141/80 mmHg 97 %   08/02/13 2349  97.9 ??F (36.6 ??C) 94 18 137/81 mmHg 100 %   08/02/13 2023 99.1 ??F (37.3 ??C) 111 18 141/90 mmHg 96 %   08/02/13 1740 - 104 - 136/74 mmHg 91 %   08/02/13 1730 - - - 166/81 mmHg 94 %   08/02/13 1720 - - - 163/81 mmHg -   08/02/13 1710 - - - 166/81 mmHg -   08/02/13 1700 - - - 142/71 mmHg -   08/02/13 1650 - - - 132/66 mmHg -   08/02/13 1630 - - - 153/78 mmHg -   08/02/13 1607 - - - 150/71 mmHg -   08/02/13 1436 98.4 ??F (36.9 ??C) 123 18 154/93 mmHg 95 %       Physical Exam:  General appearance: Alert, cooperative, no distress, appears stated age  Head: Normocephalic, without obvious abnormality, atraumatic  Neck: Supple, no lymphadenopathy or thyromegaly, trachea midline  Lungs: Clear to auscultation bilaterally  Heart: Regular rate and rhythm, S1, S2 normal, no murmur, click, rub or gallop  Abdomen: Soft, non-tender and not-distended. Bowel sounds normal. No masses, no organomegaly  Extremities: Extremities normal, atraumatic, no cyanosis or edema  Skin: Skin color, texture, turgor normal. No rashes or lesions  Neurologic: Grossly normal and non focal, normal muscle tone and power, cranial nerves are intact, normal sensation     Intake and Output:  Current Shift:     Last three shifts:             Jairo Ben, MD  08/03/2013        Hospitalist Service  [employer] [facility]   [facility]  [phone] office  Pager [phone]

## 2013-08-03 NOTE — Other (Addendum)
Assumed pt care. Received pt resting in bed, with eyes closed. Eyes opens spontaneously, no signs of distress noted. Bed on lowest position, wheels locked, call bell within reach. Will continue to monitor.

## 2013-08-03 NOTE — Progress Notes (Signed)
OT orders received. Chart reviewed. Attempted for OT eval at 1120. Pt refusing OT services at this time, stating he is independent with his ADLs and did not need help PTA." pt confirmed and verbalized not needing OT services. As a result, will d/c current OT orders. Please refer if any further needs arise for this pt.    Thank you for the referral.  Risa Grill, OT

## 2013-08-03 NOTE — Other (Signed)
Bedside and Verbal shift change report given to Elsworth Soho, RN/ Donnal Moat, RN (oncoming nurse) by Venia Minks, RN (offgoing nurse). Report included the following information SBAR, Kardex, Intake/Output, MAR and Recent Results.

## 2013-08-03 NOTE — Progress Notes (Signed)
PT orders received and patient chart reviewed.  Patient currently off the floor for HD at this time.  However per OT patient reports independence with mobility and has been ambulating to restroom without assistance.  Furthermore patient refusing nursing care.  As a result will D/C PT orders.  If patient with any changes in safety of mobility and increased willingness to participate in therapy please reorder PT.  Thank you.    Ky Barban, DPT

## 2013-08-03 NOTE — Progress Notes (Signed)
[facility]   Discharge Planning/Social Services Assessment    Reasons for Intervention: Interviewed patient, he states he live in [place] and is in town for a funeral for his mother. He uses a cane for mobility and his sister is his [employer] [phone]. He does not have a PCP but would not allow me to contact [facility] as he states he plans on returning to [place]. He states that he is not a dialysis patient when asked about hemodialysis.  Patient declined any services from care management. Informed patient that care management will be available id needed.      High Risk Criteria  [x]  Yes  [] No   Physician Referral  []  Yes  [x] No        Date    Nursing Referral  []  Yes  [x] No        Date    Patient/Family Request  []  Yes  [x] No        Date       Resources:    Medicare  []  Yes  [] No   Medicaid  []  Yes  [] No   No Resources  [x]  Yes  [] No   Private Insurance  []  Yes  [] No   Case Manager Name/Phone Number    Other  []  Yes  [] No        (i.e. Workman's Comp)         Prior Services:    Prior Services  []  Yes  [x] No   Home Health  []  Yes  [x] No   Agency    Private Home Care  []  Yes  [x] No        Number of Hours    Home Care Program  []  Yes  [x] No   Case Manager    Meals on Wheels  []  Yes  [x] No   Office on Aging  []  Yes  [x] No   Transportation Services  []  Yes  [x] No   Nursing Home  []  Yes  [x] No        Nursing Home Name    Rehab/VA Hospital  []  Yes  [x] No        Rehab/VA Name    Other       Information Source:      Information obtained from  [x]  Patient  []  Parent   []  Guardian  []  Child  []  Spouse   []  Significant Other/Partner   []  Friend      []  EMS    []  Nursing Home Chart          [x]  Other:chart   Chart Review  [x]  Yes  [] No     Family/Support System:    Patient lives with  [x]  Alone    []  Spouse   []  Significant Other  []  Children  []  Caretaker   []  Parent  []  Sibling     []  Other       Other Support System:    Is the patient responsible for care of others  []  Yes   [x] No   Information of person caring for patient on  discharge self   Managers financial affairs independently  [x]  Yes  [] No   If no, explain:      Status Prior to Admission:    Mental Status  [x]  Awake  [x]  Alert  [x]  Oriented  []  Quiet/Calm []  Lethargic/Sedated   []  Disoriented  []  Restless/Anxious  []  Combative   Personal Care  []  Dependent  [x]   Independent Personal Care  []  Requires Assistance   Meal Preparation Ability  [x]  Independent   []  Standby Assistance   []  Minimal Assistance   []  Moderate Assistance  []  Maximum Assistance     []  Total Assistance   Chores  [x]  Independent with Chores   []  N/A Nursing Home Resident   []  Requires Assistance   Bowel/Bladder  [x]  Continent  []  Catheter  []  Incontinent  []  Ostomy Self-Care    []  Urine Diversion Self-Care  []  Maximum Assistance     []  Total Assistance   Number of Persons needed for assistance    DME at home  []  Cane, Quad  [x]  Sulphur, Straight   []  Commode    []  Bathroom/Grab Bars  []  Hospital Bed  []  Nebulizer  []  Oxygen           []  Raised Toilet Seat  []  Shower Chair  []  Side Rails for Bed   []  Tub Transfer Bench   []  Walker, Rolling  []  Walker, Standard      []  Other:   Vendor      Treatment Presently Receiving:    Current Treatments  []  Chemotherapy  [x]  Dialysis?  []  Insulin  []  IVAB [x]  IVF   []  O2  []  PCA   []  PT   []  RT   []  Tube Feedings   []  Wound Care     Psychosocial Evaluation:    Verbalized Knowledge of Disease Process  [x]  Patient  [] Family   Coping with Disease Process  []  Patient  [] Family   Requires Further Counseling Coping with Disease Process  []  Patient  [] Family     Identified Projected Needs:    Home Health Aid  []  Yes  [x] No   Transportation  []  Yes  [x] No   Education  []  Yes  [x] No        Specific Education     Financial Counseling  []  Yes  [x] No   Inability to Care for Self/Will Require 24 hour care  []  Yes  [x] No   Pain Management  []  Yes  [x] No   Home Infusion Therapy  []  Yes  [x] No   Oxygen Therapy  []  Yes  [x] No   DME  []  Yes   [x] No   Long Term Care Placement  []  Yes  [x] No   Rehab  []  Yes  [x] No   Physical Therapy  []  Yes  [x] No   Needs Anticipated At This Time  []  Yes  [x] No     Intra-Hospital Referral:    Home Health Liasion  []  Yes  [x] No   [facility]  []  Yes  [x] No   Patient Representative  [x]  Yes  [] No refused   Staff for Teaching Needs  []  Yes  [x] No   Specialty Teaching Needs     Diabetic Educator  []  Yes  [x] No   Referral for Diabetic Educator Needed  []  Yes  [x] No  If Yes, place order for Nutritionist or Diabetic Consult     Tentative Discharge Plan:    Home with No Services  [x]  Yes  [] No   Home with Home Health Follow-up  []  Yes  [x] No        If Yes, specify type    Home Care Program  []  Yes  [x] No        If Yes, specify type    Meals on Wheels  []  Yes  [x] No   Office of Aging  []  Yes  [x] No  NHP  []  Yes  [x] No   Return to the Nursing Home  []  Yes  [x] No   Rehab Therapy  []  Yes  [x] No   Acute Rehab  []  Yes  [x] No   Subacute Rehab  []  Yes  [x] No   Private Care  []  Yes  [x] No   Substance Abuse Referral  []  Yes  [x] No   Transportation  []  Yes  [x] No   Chore Service  []  Yes  [x] No   Inpatient Hospice  []  Yes  [x] No   OP RT  []  Yes  [x]  No   OP Hemo  []  Yes  [x]  No   OP PT  []  Yes  [x] No   Support Group  []  Yes  [x] No   Reach to Recovery  []  Yes  [x] No   OP Oncology Clinic  []  Yes  [x] No   Clinic Appointment  []  Yes  [x] No   DME  []  Yes  [x] No   Comments    Name of D/C Planner or Social Worker Given to Patient or Family Lurena Nida, RN   Phone Number         Extension [phone]   Date Aug 03, 2013   Time 0905am

## 2013-08-03 NOTE — Progress Notes (Addendum)
1610. Patient reassessed. Patient is lying in bed, resting. Patient is A&O x 4. Patient c/o of generalized pain, not due to pain medication yet. Nasal cannula at 2L on the patient. Patient refused ordered IV fluids ( normal saline @ 150 ml/h)No further changes or complaints. Call bell within reach. Bed is locked in low position. Side rails up x 2. Will continue to monitor.  0900. Patient refused skin assessment. Patient refused to have IV fluids. Patient not urinated yet, Dr. Ardyth Gal informed. Patient weighted. Current weight is 226.0 lb. Patient states " it is his normal weight", therefore previous daily weight apparently recorded not correctly.

## 2013-08-03 NOTE — Progress Notes (Signed)
Chaplain conducted an initial consultation and Spiritual Assessment for Tyler Ballard, who is a 32 y.o.,male. Patient???s Primary Language is: [place].   According to the patient???s EMR Religious Affiliation is: [place].     The reason the Patient came to the hospital is:   Patient Active Problem List    Diagnosis Date Noted   ??? ARF (acute renal failure) 08/02/2013        The Chaplain provided the following Interventions:  Initiated a relationship of care and support.   Explored issues of faith, belief, spirituality and religious/ritual needs while hospitalized.  Listened empathically.  Provided chaplaincy education.  Provided information about Spiritual Care Services.  Offered prayer and assurance of continued prayers on patient's behalf.   Chart reviewed.    The following outcomes were achieved:  Patient shared limited information about both their medical narrative and spiritual journey/beliefs.  Patient processed feeling about current hospitalization.  Patient expressed gratitude for the Chaplain's visit.    Assessment:  Patient does not have any religious/cultural needs that will affect patient???s preferences in health care.  Patient did not indicate any spiritual or religious issues which require Spiritual Care Services interventions at this time.       Plan:  Chaplains will continue to follow and will provide pastoral care on an as needed/requested basis.  Chaplain recommends bedside caregivers page chaplain on duty if patient shows signs of acute spiritual or emotional distress.    Chaplain Royetta Car   Staff Chaplain   Spiritual Care   [phone]

## 2013-08-03 NOTE — Other (Signed)
Bedside and Verbal shift change report given to Grace, RN (oncoming nurse) by Olena Yermak, RN   (offgoing nurse). Report included the following information SBAR, Kardex, MAR and Recent Results.

## 2013-08-03 NOTE — Progress Notes (Addendum)
Renal: [facility]    Pt known to me from recent admission to [facility]. Just signed out AMA from [facility] about 24 hours ago.  Pt is ESRD (not AKI)  and arrangements were made to place him at [facility]. He has an AVF for access.  He was receiving 3 mg IV dilaudid Q3 hours at [facility] for presumed sickle.  The hospitalist at [facility] checked a Hb electrophoresis which was nl. The pt was told by the hospitalist that they would change the IV narcotics to po and the pt immediately signed out AMA.    Dialysis arranged today for missed treatment yesterday.  Paged the hospitalist service to provide this historical information. Records available at [facility].    Fabio Pierce, MD  [phone]

## 2013-08-04 LAB — CBC WITH AUTOMATED DIFF
ABS. BASOPHILS: 0 10*3/uL (ref 0.0–0.06)
ABS. EOSINOPHILS: 0.1 10*3/uL (ref 0.0–0.4)
ABS. LYMPHOCYTES: 0.6 10*3/uL — ABNORMAL LOW (ref 0.9–3.6)
ABS. MONOCYTES: 0.4 10*3/uL (ref 0.05–1.2)
ABS. NEUTROPHILS: 1.4 10*3/uL — ABNORMAL LOW (ref 1.8–8.0)
BASOPHILS: 0 % (ref 0–2)
EOSINOPHILS: 4 % (ref 0–5)
HCT: 23.5 % — ABNORMAL LOW (ref 36.0–48.0)
HGB: 7.6 g/dL — ABNORMAL LOW (ref 13.0–16.0)
LYMPHOCYTES: 24 % (ref 21–52)
MCH: 28.5 PG (ref 24.0–34.0)
MCHC: 32.3 g/dL (ref 31.0–37.0)
MCV: 88 FL (ref 74.0–97.0)
MONOCYTES: 15 % — ABNORMAL HIGH (ref 3–10)
MPV: 10 FL (ref 9.2–11.8)
NEUTROPHILS: 57 % (ref 40–73)
PLATELET: 121 10*3/uL — ABNORMAL LOW (ref 135–420)
RBC: 2.67 M/uL — ABNORMAL LOW (ref 4.70–5.50)
RDW: 17 % — ABNORMAL HIGH (ref 11.6–14.5)
WBC: 2.5 10*3/uL — ABNORMAL LOW (ref 4.6–13.2)

## 2013-08-04 LAB — RENAL FUNCTION PANEL
Albumin: 2.2 g/dL — ABNORMAL LOW (ref 3.4–5.0)
Anion gap: 8 mmol/L (ref 3.0–18)
BUN/Creatinine ratio: 6 — ABNORMAL LOW (ref 12–20)
BUN: 37 MG/DL — ABNORMAL HIGH (ref 7.0–18)
CO2: 27 mmol/L (ref 21–32)
Calcium: 7.5 MG/DL — ABNORMAL LOW (ref 8.5–10.1)
Chloride: 100 mmol/L (ref 100–108)
Creatinine: 6.09 MG/DL — ABNORMAL HIGH (ref 0.6–1.3)
GFR est AA: 14 mL/min/{1.73_m2} — ABNORMAL LOW (ref >60)
GFR est non-AA: 11 mL/min/{1.73_m2} — ABNORMAL LOW (ref >60)
Glucose: 75 mg/dL (ref 74–99)
Phosphorus: 4.1 MG/DL (ref 2.5–4.9)
Potassium: 4.3 mmol/L (ref 3.5–5.5)
Sodium: 135 mmol/L — ABNORMAL LOW (ref 136–145)

## 2013-08-04 MED ADMIN — hydroxyurea (HYDREA) chemo cap 500 mg: ORAL | @ 13:00:00 | NDC 68084028411

## 2013-08-04 MED ADMIN — promethazine (PHENERGAN) injection 12.5 mg: INTRAVENOUS | @ 09:00:00 | NDC 00641092821

## 2013-08-04 MED ADMIN — HYDROmorphone (DILAUDID) injection 3 mg: INTRAVENOUS | @ 06:00:00 | NDC 00641012121

## 2013-08-04 MED ADMIN — sodium chloride (NS) flush 5-10 mL: INTRAVENOUS | @ 10:00:00 | NDC 87701099893

## 2013-08-04 MED ADMIN — diphenhydrAMINE (BENADRYL) injection 25 mg: INTRAVENOUS | @ 18:00:00 | NDC 00641037621

## 2013-08-04 MED ADMIN — HYDROmorphone (DILAUDID) injection 3 mg: INTRAVENOUS | @ 15:00:00 | NDC 00641012121

## 2013-08-04 MED ADMIN — promethazine (PHENERGAN) injection 12.5 mg: INTRAVENOUS | @ 02:00:00 | NDC 00641092821

## 2013-08-04 MED ADMIN — promethazine (PHENERGAN) injection 12.5 mg: INTRAVENOUS | @ 15:00:00 | NDC 00641092821

## 2013-08-04 MED ADMIN — amLODIPine (NORVASC) tablet 5 mg: ORAL | @ 13:00:00 | NDC 68084025911

## 2013-08-04 MED ADMIN — promethazine (PHENERGAN) injection 12.5 mg: INTRAVENOUS | @ 21:00:00 | NDC 00641092821

## 2013-08-04 MED ADMIN — HYDROmorphone (DILAUDID) injection 3 mg: INTRAVENOUS | @ 12:00:00 | NDC 00641012121

## 2013-08-04 MED ADMIN — HYDROmorphone (DILAUDID) injection 3 mg: INTRAVENOUS | NDC 00641012121

## 2013-08-04 MED ADMIN — heparin (porcine) injection 5,000 Units: SUBCUTANEOUS | @ 01:00:00 | NDC 25021040201

## 2013-08-04 MED ADMIN — vancomycin (VANCOCIN) injection 1,500 mg: INTRAVENOUS | @ 10:00:00 | NDC 00409653101

## 2013-08-04 MED ADMIN — heparin (porcine) injection 5,000 Units: SUBCUTANEOUS | NDC 25021040201

## 2013-08-04 MED ADMIN — calcium acetate (PHOSLO) capsule 1,334 mg: ORAL | @ 22:00:00 | NDC 68084047911

## 2013-08-04 MED ADMIN — HYDROmorphone (DILAUDID) injection 3 mg: INTRAVENOUS | @ 02:00:00 | NDC 00641012121

## 2013-08-04 MED ADMIN — HYDROmorphone (DILAUDID) injection 3 mg: INTRAVENOUS | @ 09:00:00 | NDC 00641012121

## 2013-08-04 MED ADMIN — HYDROmorphone (DILAUDID) injection 3 mg: INTRAVENOUS | @ 18:00:00 | NDC 00641012121

## 2013-08-04 MED ADMIN — sodium chloride (NS) flush 5-10 mL: INTRAVENOUS | @ 18:00:00 | NDC 87701099893

## 2013-08-04 MED ADMIN — sodium chloride (NS) flush 5-10 mL: INTRAVENOUS | @ 01:00:00 | NDC 87701099893

## 2013-08-04 MED ADMIN — HYDROmorphone (DILAUDID) injection 3 mg: INTRAVENOUS | @ 21:00:00 | NDC 00641012121

## 2013-08-04 MED ADMIN — diphenhydrAMINE (BENADRYL) injection 25 mg: INTRAVENOUS | @ 12:00:00 | NDC 00641037621

## 2013-08-04 MED ADMIN — diphenhydrAMINE (BENADRYL) injection 25 mg: INTRAVENOUS | @ 06:00:00 | NDC 00641037621

## 2013-08-04 MED ADMIN — folic acid (FOLVITE) tablet 1 mg: ORAL | @ 13:00:00 | NDC 62584089711

## 2013-08-04 MED ADMIN — diphenhydrAMINE (BENADRYL) injection 25 mg: INTRAVENOUS | NDC 00641037621

## 2013-08-04 NOTE — Progress Notes (Signed)
[facility] Dosing Services    Consult requested by Dr. Corinda Gubler for vancomycin therapy  Indication: Positive blood cultures (gram positive cocci in pairs/clusters)  Goal Level(s): 10-15  Ke:  0.023  T 1/2: 30 hours  Vd: 54.6 liters  Cl: 1.235 liters/hour    32 y.o., male   Height / Weight:     Ht Readings from Last 1 Encounters:   08/02/13 165.1 cm (65")        Wt Readings from Last 1 Encounters:   08/03/13 102.513 kg (226 lb)      Temp: 98.6 ??F (37 ??C) ()    Serum Creatinine:   Lab Results   Component Value Date/Time    Creatinine 6.09 08/04/2013  4:45 AM     Creatinine Clearance:  Estimated Creatinine Clearance: 19 ml/min (based on Cr of 6.09).  BUN:    Lab Results   Component Value Date/Time    BUN 37 08/04/2013  4:45 AM      WBC / C&S:    Lab Results   Component Value Date/Time    WBC 2.5 08/04/2013  9:00 AM    Culture result: CULTURE IN PROGRESS,FURTHER UPDATES TO FOLLOW 08/03/2013  1:22 AM    Culture result: Sent to [facility] for ID/Susceptibility if indicated 08/03/2013  1:22 AM    Culture result: CULTURE IN PROGRESS,FURTHER UPDATES TO FOLLOW 08/03/2013  1:22 AM    Culture result: Sent to [facility] for ID/Susceptibility if indicated 08/03/2013  1:22 AM       Other Current Antibiotics:    Current Antimicrobial Therapy   (168h ago through future)    Ordered     Start Stop    08/04/13 1017  VANCOMYCIN INFORMATION NOTE  OTHER,    ONCE      08/05/13 0600 08/05/13 1759          The patient received an initial dose of vancomycin 1.5 grams IV for one dose this morning.  The patient is in end stage renal disease and will be starting hemodialysis.  I have ordered a random vancomycin serum level for Monday morning, 18 August, approximately 24 hours after the previous vancomycin dose.  Pharmacy will follow daily and make changes to dose and/or frequency as needed.    M.D. Valentina Gu, Pharmacist  08/04/2013 10:18 AM

## 2013-08-04 NOTE — Progress Notes (Signed)
Patient reassessed. Patient is lying in bed, resting. Patient is A&O x 4. Patient c/o pain in low back. See MAR for interventions. No further changes or complaints. Call bell within reach. Bed is locked in low position. Side rails up x 2. Will continue to monitor.

## 2013-08-04 NOTE — Other (Signed)
Bedside and Verbal shift change report given to Patricia Pesa (oncoming nurse) by Lelon Mast   (offgoing nurse). Report included the following information SBAR, Kardex, Intake/Output, MAR and Recent Results.

## 2013-08-04 NOTE — Progress Notes (Signed)
Renal Daily Progress Note   [facility]  [phone]    Patient: Tyler Ballard MRN: [id number]  SSN: [id number]    Date of Birth: 10/29/1980  Age: 32 y.o.  Sex: male      Impression:  ESRD- HD MWF   [facility]   Access: AVF  Narcotic seeking behavior   Recent hospital admission at [facility]   Previous admission in [facility]   All similar circumstances   Pt said that he had to leave [facility] to take care of family stuff  Anemia of CKD   Noted nl Hb electrophoresis at [facility]  HIV  Gram + bacteremia    Indwelling central line from Feb 2014-- says that it was placed in [place]   Access for HD is AVF    Plan:  Add nephrovite  Add phoslo  Procrit with dialysis  Noted plans for ID eval  Renal labs with HD days    S:known to me from recent hospital admission at [facility]. Says that he left AMA cause he had to deal with a family situation    Physical Exam:  Blood pressure 140/82, pulse 88, temperature 98.3 ??F (36.8 ??C), resp. rate 15, height 5\' 5"  (1.651 m), weight 102.059 kg (225 lb), SpO2 98.00%.      Intake/Output Summary (Last 24 hours) at 08/04/13 1719  Last data filed at 08/04/13 0718   Gross per 24 hour   Intake    880 ml   Output      0 ml   Net    880 ml       General: No distress  CV: RRR, no murmur, tunneled CVL in chest  Lungs: Clear  Abd: Soft, Nontender, bowel sounds present  Extrem:  edema    Labs:  Basic Metabolic Profile   Lab Results   Component Value Date    NA 135* 08/04/2013    CO2 27 08/04/2013    BUN 37* 08/04/2013         No components found with this basename: creat         CBC   Lab Results   Component Value Date/Time    WBC 2.5* 08/04/2013  9:00 AM    RBC 2.67* 08/04/2013  9:00 AM    HCT 23.5* 08/04/2013  9:00 AM    MCV 88.0 08/04/2013  9:00 AM    MCH 28.5 08/04/2013  9:00 AM    MCHC 32.3 08/04/2013  9:00 AM    RDW 17.0* 08/04/2013  9:00 AM          Meds Reviewed.    Diamone Whistler K. Lockie Mola, MD  August 04, 2013  [facility]

## 2013-08-04 NOTE — Other (Signed)
Bedside and Verbal shift change report given to Laveda Abbe, Charity fundraiser (oncoming nurse) by Donald Siva, RN   (offgoing nurse). Report included the following information SBAR, Kardex, MAR and Recent Results.

## 2013-08-04 NOTE — Other (Signed)
Assumed patient care. Received pt sitting on the bed, no signs of distress noted. Bed on lowest position, wheels locked, call bell within reach. Will continue to monitor.

## 2013-08-04 NOTE — Progress Notes (Addendum)
Daily Progress Note    Patient: Tyler Ballard   Age:  32 y.o.  DOA: 08/02/2013   Admit Dx / CC: ARF (acute renal failure)  LOS:  LOS: 2 days       Assessment/ Plan:  Hospital Problems Never Reviewed        ICD-9-CM Class Noted POA    ARF (acute renal failure) 584.9  08/02/2013 Unknown            sickle cell crisis   Hgb electrophoresis at [facility] normal per nephro  Patient signed out AMA at [facility] when switching IV dilaudid 3 mg q3h to PO   Dilaudid prn   IVF   O2NC   Cont outpt Rx   BCx x 4 08/03/13 + x 2 in ED    HIV + per [facility]   Consult ID     ESRD-HD  IVF   Consult nephro  Unit for HD arranged     Hypocalcemia  Defer to nephro    HTN   Adjust outpt Rx     Tobacco   Counseled patient regarding tobacco cessation, educated patient regarding risks including cancer COPD heart and peripheral vascular disease, educated patient regarding Chantix Welbutrin nicotine patches. Patient understands all was told and answered all questions. Time Spent 5 minutes     Nicotine patch   DVT prophylaxis hep sq   Reason for continued hospitalization arf   Anticipated Date of Discharge: 08/07/13   Anticipated Disposition (home, SNF) : HH    Subjective:   Good po bm   No uo   Diffuse pain   Low grade fever     Review of Systems -   General ROS: denies fatigue malaise   Psychological ROS: denies anxiety depression   Ophthalmic ROS: denies vision changes   ENT ROS: denies sinusitis throat discomfort  Hematological and Lymphatic ROS: denies easy bruising  Respiratory ROS: denies cough, shortness of breath, or wheezing  Cardiovascular ROS: denies chest pain or dyspnea on exertion  Gastrointestinal ROS: denies abdominal pain, nausea vomiting diarrhea melena   Genito-Urinary ROS: denies dysuria, hesitancy, or hematuria  Musculoskeletal ROS: denies myalgias, arthralgias   Neurological ROS: denies focal changes of weakness, sensory changes  Dermatological ROS: denies hives       Objective:     Patient Vitals for the past 24 hrs:   Temp  Pulse Resp BP SpO2   08/04/13 0717 98.6 ??F (37 ??C) 90 16 148/84 mmHg 99 %   08/04/13 0453 99.2 ??F (37.3 ??C) 95 18 145/84 mmHg 99 %   08/03/13 2328 99.5 ??F (37.5 ??C) 95 16 145/85 mmHg 98 %   08/03/13 1545 98.2 ??F (36.8 ??C) 87 16 165/89 mmHg 100 %   08/03/13 1515 - 82 - 144/83 mmHg -   08/03/13 1445 - 74 - 98/72 mmHg -   08/03/13 1430 - 85 - 116/65 mmHg -   08/03/13 1415 - 86 - 110/67 mmHg -   08/03/13 1400 - 81 - 125/68 mmHg -   08/03/13 1345 - 83 - 128/72 mmHg -   08/03/13 1330 - 84 - 124/78 mmHg -   08/03/13 1315 - 80 - 124/82 mmHg -   08/03/13 1300 - 89 - 165/85 mmHg -   08/03/13 1245 - 86 - 141/78 mmHg -   08/03/13 1230 - 88 - 138/82 mmHg -   08/03/13 1215 - 82 - 146/79 mmHg -   08/03/13 1200 - 88 - 138/82 mmHg -  Physical Exam:  General appearance: Alert, cooperative, no distress, appears stated age  Head: Normocephalic, without obvious abnormality, atraumatic  Neck: Supple, no lymphadenopathy or thyromegaly, trachea midline  Lungs: Clear to auscultation bilaterally  Heart: Regular rate and rhythm, S1, S2 normal, no murmur, click, rub or gallop  Abdomen: Soft, non-tender and not-distended. Bowel sounds normal. No masses, no organomegaly  Extremities: Extremities normal, atraumatic, no cyanosis or edema  Skin: Skin color, texture, turgor normal. No rashes or lesions  Neurologic: Grossly normal and non focal, normal muscle tone and power, cranial nerves are intact, normal sensation     Intake and Output:  Current Shift:  08/17 0700 - 08/17 1859  In: 480 [P.O.:480]  Out: -   Last three shifts:  08/15 1900 - 08/17 0659  In: 880 [P.O.:630; I.V.:250]  Out: 3600           Jairo Ben, MD  08/04/2013        Hospitalist Service  [facility]   [facility]  [phone] office  Pager [phone]

## 2013-08-05 LAB — HEMOGLOBIN FRACTIONATION
HEMOGLOBIN A2: 2.3 % (ref 0.7–3.1)
HEMOGLOBIN F: 3 % — ABNORMAL HIGH (ref 0.0–2.0)
HEMOGLOBIN S: 0 %
HGB A: 94.7 % (ref 94.0–98.0)
HGB SOLUBILITY: NEGATIVE
Hemoglobin A2: 2.3 % (ref 0.7–3.1)
Hemoglobin A: 94.7 % (ref 94.0–98.0)
Hemoglobin C: 0 %
Hemoglobin C: 0 %
Hemoglobin F: 3 % — ABNORMAL HIGH (ref 0.0–2.0)
Hemoglobin S: 0 %
Hgb Solubility: NEGATIVE

## 2013-08-05 LAB — HEPATITIS B SURFACE ANTIBODY
Hep B S Ab Interp: POSITIVE
Hep B S Ab: 10.72 m[IU]/mL (ref >10.0)

## 2013-08-05 LAB — HEP B SURFACE AG
Hep B surface Ag Interp.: NEGATIVE
Hepatitis B surface Ag: <0.1 Index (ref <1.00)

## 2013-08-05 LAB — CBC WITH AUTOMATED DIFF
ABS. BASOPHILS: 0 10*3/uL (ref 0.0–0.06)
ABS. EOSINOPHILS: 0.2 10*3/uL (ref 0.0–0.4)
ABS. LYMPHOCYTES: 0.6 10*3/uL — ABNORMAL LOW (ref 0.9–3.6)
ABS. MONOCYTES: 0.3 10*3/uL (ref 0.05–1.2)
ABS. NEUTROPHILS: 1.7 10*3/uL — ABNORMAL LOW (ref 1.8–8.0)
BASOPHILS: 0 % (ref 0–2)
EOSINOPHILS: 6 % — ABNORMAL HIGH (ref 0–5)
HCT: 25.7 % — ABNORMAL LOW (ref 36.0–48.0)
HGB: 8.2 g/dL — ABNORMAL LOW (ref 13.0–16.0)
LYMPHOCYTES: 22 % (ref 21–52)
MCH: 28.4 PG (ref 24.0–34.0)
MCHC: 31.9 g/dL (ref 31.0–37.0)
MCV: 88.9 FL (ref 74.0–97.0)
MONOCYTES: 11 % — ABNORMAL HIGH (ref 3–10)
MPV: 9.3 FL (ref 9.2–11.8)
NEUTROPHILS: 61 % (ref 40–73)
PLATELET: 132 10*3/uL — ABNORMAL LOW (ref 135–420)
RBC: 2.89 M/uL — ABNORMAL LOW (ref 4.70–5.50)
RDW: 17 % — ABNORMAL HIGH (ref 11.6–14.5)
WBC: 2.8 10*3/uL — ABNORMAL LOW (ref 4.6–13.2)

## 2013-08-05 LAB — RENAL FUNCTION PANEL
Albumin: 2.4 g/dL — ABNORMAL LOW (ref 3.4–5.0)
Anion gap: 10 mmol/L (ref 3.0–18)
BUN/Creatinine ratio: 6 — ABNORMAL LOW (ref 12–20)
BUN: 44 MG/DL — ABNORMAL HIGH (ref 7.0–18)
CO2: 24 mmol/L (ref 21–32)
Calcium: 8.1 MG/DL — ABNORMAL LOW (ref 8.5–10.1)
Chloride: 100 mmol/L (ref 100–108)
Creatinine: 7.18 MG/DL — ABNORMAL HIGH (ref 0.6–1.3)
GFR est AA: 11 mL/min/{1.73_m2} — ABNORMAL LOW (ref >60)
GFR est non-AA: 9 mL/min/{1.73_m2} — ABNORMAL LOW (ref >60)
Glucose: 94 mg/dL (ref 74–99)
Phosphorus: 4.8 MG/DL (ref 2.5–4.9)
Potassium: 4.4 mmol/L (ref 3.5–5.5)
Sodium: 134 mmol/L — ABNORMAL LOW (ref 136–145)

## 2013-08-05 LAB — CALCIUM, IONIZED: Ionized Calcium: 1.03 MMOL/L — ABNORMAL LOW (ref 1.12–1.32)

## 2013-08-05 LAB — HEP B SURFACE AB
Hep B surface Ab Interp.: POSITIVE
Hepatitis B surface Ab: 10.72 m[IU]/mL (ref >10.0)

## 2013-08-05 LAB — MAGNESIUM: Magnesium: 2.1 mg/dL (ref 1.8–2.4)

## 2013-08-05 LAB — VANCOMYCIN, RANDOM: Vancomycin, random: 16.9 UG/ML (ref 5.0–40)

## 2013-08-05 MED ADMIN — HYDROmorphone (DILAUDID) injection 3 mg: INTRAVENOUS | @ 06:00:00 | NDC 00641012121

## 2013-08-05 MED ADMIN — HYDROmorphone (DILAUDID) injection 3 mg: INTRAVENOUS | @ 15:00:00 | NDC 00641012121

## 2013-08-05 MED ADMIN — HYDROmorphone (DILAUDID) injection 3 mg: INTRAVENOUS | @ 09:00:00 | NDC 00641012121

## 2013-08-05 MED ADMIN — diphenhydrAMINE (BENADRYL) injection 25 mg: INTRAVENOUS | @ 12:00:00 | NDC 00641037621

## 2013-08-05 MED ADMIN — diphenhydrAMINE (BENADRYL) injection 25 mg: INTRAVENOUS | @ 18:00:00 | NDC 00641037621

## 2013-08-05 MED ADMIN — HYDROmorphone (DILAUDID) injection 3 mg: INTRAVENOUS | @ 18:00:00 | NDC 00641012121

## 2013-08-05 MED ADMIN — sodium chloride (NS) flush 5-10 mL: INTRAVENOUS | @ 03:00:00 | NDC 87701099893

## 2013-08-05 MED ADMIN — amLODIPine (NORVASC) tablet 5 mg: ORAL | @ 12:00:00 | NDC 68084025911

## 2013-08-05 MED ADMIN — HYDROmorphone (DILAUDID) injection 3 mg: INTRAVENOUS | @ 03:00:00 | NDC 00641012121

## 2013-08-05 MED ADMIN — sodium chloride (NS) flush 5-10 mL: INTRAVENOUS | @ 11:00:00 | NDC 87701099893

## 2013-08-05 MED ADMIN — promethazine (PHENERGAN) injection 12.5 mg: INTRAVENOUS | @ 09:00:00 | NDC 00641092821

## 2013-08-05 MED ADMIN — calcium acetate (PHOSLO) capsule 1,334 mg: ORAL | @ 12:00:00 | NDC 68084047911

## 2013-08-05 MED ADMIN — vancomycin (VANCOCIN) 750 mg in 0.9% sodium chloride (MBP/ADV) 250 mL ADV: INTRAVENOUS | @ 20:00:00 | NDC 00409710102

## 2013-08-05 MED ADMIN — calcium acetate (PHOSLO) capsule 1,334 mg: ORAL | @ 20:00:00 | NDC 68084047911

## 2013-08-05 MED ADMIN — HYDROmorphone (DILAUDID) injection 3 mg: INTRAVENOUS | @ 21:00:00 | NDC 00641012121

## 2013-08-05 MED ADMIN — epoetin alfa (EPOGEN;PROCRIT) injection 10,000 Units: INTRAVENOUS | @ 22:00:00 | NDC 59676031000

## 2013-08-05 MED ADMIN — promethazine (PHENERGAN) injection 12.5 mg: INTRAVENOUS | @ 21:00:00 | NDC 00641092821

## 2013-08-05 MED ADMIN — folic acid (FOLVITE) tablet 1 mg: ORAL | @ 12:00:00 | NDC 62584089711

## 2013-08-05 MED ADMIN — sodium chloride (NS) flush 5-10 mL: INTRAVENOUS | @ 20:00:00 | NDC 87701099893

## 2013-08-05 MED ADMIN — promethazine (PHENERGAN) injection 12.5 mg: INTRAVENOUS | @ 15:00:00 | NDC 00641092821

## 2013-08-05 MED ADMIN — hydroxyurea (HYDREA) chemo cap 500 mg: ORAL | @ 13:00:00 | NDC 68084028401

## 2013-08-05 MED ADMIN — HYDROmorphone (DILAUDID) injection 3 mg: INTRAVENOUS | @ 12:00:00 | NDC 00641012121

## 2013-08-05 MED ADMIN — B complex-vitamin C-folic acid (NEPHRO-VITE) 0.8 mg tab: ORAL | @ 12:00:00 | NDC 87701089928

## 2013-08-05 MED ADMIN — heparin (porcine) injection 5,000 Units: SUBCUTANEOUS | @ 12:00:00 | NDC 25021040201

## 2013-08-05 MED ADMIN — diphenhydrAMINE (BENADRYL) injection 25 mg: INTRAVENOUS | @ 06:00:00 | NDC 00641037621

## 2013-08-05 NOTE — Progress Notes (Signed)
Chart reviewed.  Plan remains home when medically stable.  Please try to utilize $4 [facility] drug list @ discharge, if possible, as pt has no ins., thanks.  Will cont to follow for further needs.  Pat Rizzo,RN, ext. [phone].

## 2013-08-05 NOTE — Other (Signed)
Bedside and Verbal shift change report given to Tanya Scott,RN (oncoming nurse) by Mary Lorelie Lazaro,RN   (offgoing nurse). Report included the following information SBAR, Kardex, Intake/Output, MAR and Recent Results.

## 2013-08-05 NOTE — Other (Addendum)
Assumed care from off going nurse at the bedside.  Patient lying in bed without distress.  Patient alert and orient X 4.  Patient has call bell within reach, and bed locked in low position.  See complex assessment for further findings.    1130 patient off the unit via stretcher with transport personnel going to dialysis.  Patient medicated for pain.

## 2013-08-05 NOTE — Other (Signed)
Bedside and Verbal shift change report given to LaToya, RN (oncoming nurse) by Tanya L Scott, RN (offgoing nurse).  Report given with SBAR, Kardex, Intake/Output, MAR and Recent Results.           .

## 2013-08-05 NOTE — Progress Notes (Signed)
NUTRITION initial assessment    RECOMMENDATIONS:   1. Renal Diet  2. Monitor weight and PO intake     GOALS:   1. Good PO intake (>75% of meals)  2. Maintain weight    SUBJECTIVE/OBJECTIVE:   Pt was unhappy with breakfast this morning. He wants meats with his BF. He does not like being on Renal diet. Reviewed diet appropriate food choices.     Dx: ARF  PMH: HTN, Sickle Cell Disease  Diet: Renal  Medications: [x]  Reviewed  Allergies: [x]  Reviewed      Labs:    Lab Results   Component Value Date/Time    Sodium 134 08/05/2013  5:25 AM    Potassium 4.4 08/05/2013  5:25 AM    Chloride 100 08/05/2013  5:25 AM    CO2 24 08/05/2013  5:25 AM    Anion gap 10 08/05/2013  5:25 AM    Glucose 94 08/05/2013  5:25 AM    BUN 44 08/05/2013  5:25 AM    Creatinine 7.18 08/05/2013  5:25 AM    Calcium 8.1 08/05/2013  5:25 AM    Magnesium 2.1 08/05/2013  5:25 AM    Phosphorus 4.8 08/05/2013  5:25 AM    Albumin 2.4 08/05/2013  5:25 AM       Anthropometrics: BMI (calculated): 30  Last 3 Recorded Weights in this Encounter    08/03/13 0800 08/04/13 1702 08/05/13 0721   Weight: 102.513 kg (226 lb) 102.059 kg (225 lb) 102.286 kg (225 lb 8 oz)      Ht Readings from Last 1 Encounters:   08/02/13 5\' 5"  (1.651 m)       IBW: 136# %IBW: 165%       Estimated Nutrition Needs: [x]  MSJ []  IJEE []  Other ____________    Calories: 2275 Based on:   [x]  Actual BW []  IBW []   Adjusted BW     Protein:   102-123g Based on:   [x]  Actual BW []  IBW []   Adjusted BW     Fluid:       Based on:   [x]  Actual BW []  IBW []   Adjusted BW     Wt Status:  []  Underweight (<18.5) []  Overweight (25 - 29.9) [x]  Mild Obesity (30 - 34.9)  []  Moderate Obesity (35 - 39.9) []  Morbid Obesity (40+)   []  Moderate Malnutrition []  Severe Malnutrition in the context of _____________  Feeding Limitations:  []  Difficulty chewing []  Difficulty swallowing []  Mouth pain []  Assist with feeding []  Total feed [x]  None           ASSESSMENT:   Per BMI of 30, patient is mildly obese. Pt dislikes renal diet.  PO and appetite are variable. Addressed food preferences and discussed renal diet. RD to follow.     Nutrition Diagnoses:   Obesity as related to excessive caloric intake as evidenced by BMI of 30.     Nutrition Risk:  []  High  [x]  Moderate []   Low    Jasper Riling, RD

## 2013-08-05 NOTE — Consults (Signed)
Infectious Disease Consultation Note    Requested by: Dr Robinette Haines    Reason for consult: HIV    Date of admission: 08/02/2013    Date of consult: August 05, 2013  ------------------------------------------------------------------------------------------------------------------    Current abx Prior abx   Vanco 8/18-0        New BSI- 2 of 4 blood cx positive for GPC. 1 of 2  identified as CoNS.  - concern for CRBSI  -  No fever, low WBC. No localizing signs.  - says had infection one month ago and was treated with Abx and resolved. ? New infection vs persistent BSI from infected cath. - repeat 2 more Cx sets today  - Continue on IV Vanco  - Check echo  -D/w patient that if repeat blood cx are positive, will need to d/c catheter.  - If repeat blood cx are negative, still would like to d/c catheter ideally but given concern for IV access, will try to salvage if only CoNS (no Staph) and no vegetation on heart valves   HIV- pt says for over 10-12 years.  - He is not on treatment and says has good CD4 count one month ago when he was admitted for infection.  - On talking to him, he is not ready for HARt and also given non-compliance, won't start any HART while in hospital  - need records from recent hospitalization - D/w patient to follow with his doctor in [place] (likely dept of health as no insurance) and try to be on HART  - Education, prevention, treatment and long term complications of HIV d/w him  - He doesn't seem to be ready for now  - No need to start HARt while in hospital  - Need records from recent hospitalization (CD4/ HIV vL) if none, will order Cd4/vL   Leucopenia, thrombocytopenia  - suspect sec to HIV - monitor   ESRD- on hemodialysis  - through Rt arm fistula - per renal   Sickle cell disease  - Interestingly normal Hb electrophoresis per records - Mx per primary team   Tobacco abuse - counseling done   HTN                Cultures-  8/16- 2 of 4 blcx GPC,       Patient Active Problem List   Diagnosis Code   ???  ARF (acute renal failure) 584.9         HPI: This is a 32 year old AAM who came to ED with a c/o systemic pain secondary to generalized pain from his sickle cell disease. He's from [place] and in town for his mothers funeral and started hurting 2 days ago. Per renal notes, he was admitted at [facility] (I have checked [facility] notes) and he left AMA after his pain meds were changed. He is ESRD for many years and gets HD 3 times/week through a Rt arm fistula. He says he was hospitalized a month ago in [place] for infection. It was thought to be sec to his line in left chest which was placed in Feb 2014. He was treated with IV Abx and his infection was gone per him and they didn't remove or change the line. He came with generalized pain to [facility] and was admitted and started on pain meds. He also with h/o HIV for over 10 years but not on any treatment. He says on last admission in [place], he was checked and told that because his counts are good, he doesn't  need to be on Rx. His 2 of 4 blood cx from admission also turn positive for GPC and he was started on IV Vanco and we were asked for further eval and management.      Past Medical History   Diagnosis Date   ??? HTN (hypertension)    ??? Sickle cell disease        Past Surgical History   Procedure Laterality Date   ??? Hx orthopaedic       right hip        Social History:     History     Social History   ??? Marital Status: SINGLE     Spouse Name: N/A     Number of Children: N/A   ??? Years of Education: N/A     Occupational History   ??? Not on file.     Social History Main Topics   ??? Smoking status: Current Every Day Smoker   ??? Smokeless tobacco: Not on file   ??? Alcohol Use: No   ??? Drug Use: No   ??? Sexually Active: Not on file     Other Topics Concern   ??? Not on file     Social History Narrative   ??? No narrative on file       Family History:     Family History   Problem Relation Age of Onset   ??? Sickle Cell Anemia         Allergies:   No Known Allergies        Home Medications:      Prescriptions prior to admission   Medication Sig   ??? HYDROmorphone (DILAUDID) 8 mg tablet Take 16 mg by mouth every four (4) hours as needed for Pain.   ??? lisinopril (PRINIVIL, ZESTRIL) 10 mg tablet Take 10 mg by mouth daily.   ??? amLODIPine (NORVASC) 10 mg tablet Take  by mouth daily.   ??? hydroxyurea 200 mg cap Take  by mouth.   ??? folic acid 400 mcg tablet Take 400 mcg by mouth daily.       Current Medications:     Current Facility-Administered Medications   Medication Dose Route Frequency   ??? vancomycin (VANCOCIN) 750 mg in 0.9% sodium chloride (MBP/ADV) 250 mL ADV  750 mg IntraVENous Q MON, WED & FRI   ??? [START ON 08/09/2013] VANCOMYCIN INFORMATION NOTE 1 Each  1 Each Other ONCE   ??? B complex-vitamin C-folic acid (NEPHRO-VITE) 0.8 mg tab  1 Tab Oral DAILY   ??? calcium acetate (PHOSLO) capsule 1,334 mg  2 Cap Oral TID WITH MEALS   ??? epoetin alfa (EPOGEN;PROCRIT) injection 10,000 Units  10,000 Units IntraVENous Q MON, WED & FRI   ??? amLODIPine (NORVASC) tablet 5 mg  5 mg Oral DAILY   ??? folic acid (FOLVITE) tablet 1 mg  1 mg Oral DAILY   ??? HYDROmorphone (DILAUDID) injection 3 mg  3 mg IntraVENous Q3H PRN   ??? diphenhydrAMINE (BENADRYL) injection 25 mg  25 mg IntraVENous Q6H PRN   ??? sodium chloride (NS) flush 5-10 mL  5-10 mL IntraVENous Q8H   ??? sodium chloride (NS) flush 5-10 mL  5-10 mL IntraVENous PRN   ??? heparin (porcine) injection 5,000 Units  5,000 Units SubCUTAneous Q12H   ??? nicotine (NICODERM CQ) 7 mg/24 hr patch 1 Patch  1 Patch TransDERmal DAILY   ??? promethazine (PHENERGAN) injection 12.5 mg  12.5 mg IntraVENous Q4H PRN   ??? hydroxyurea (HYDREA) chemo cap  500 mg  500 mg Oral DAILY       Review of Systems:   12 points ROS done. Pertinenet positives and negatives are as follows;- Pt denies fever, chills. He had some nausea but better now. Denies any diarrhea or constipation. No abd pain. No bleeding. Appetite is good. C/o generalized pain, similar to his previous pain from sickle crisis but under control on meds.  No HA. No trouble with AVf or chest catheter. No trouble with ears or eyes. C/o itching in scalp. Dry scabs. ROS otherwise negative.        Physical Exam:  Vitals  Temp (24hrs), Avg:98.4 ??F (36.9 ??C), Min:97.9 ??F (36.6 ??C), Max:99 ??F (37.2 ??C)    BP 148/85   Pulse 95   Temp(Src) 97.9 ??F (36.6 ??C)   Resp 17   Ht 5\' 5"  (1.651 m)   Wt 102.286 kg (225 lb 8 oz)   BMI 37.53 kg/m2   SpO2 92%    General: Well-developed, 32 y.o. year-old, male, in no acute distress  HEENT: Normocephalic, anicteric sclerae, Pupils equal, round reactive to light, no oropharyngeal lesions. No sinus tenderness.  Neck:  Supple, b/l cervical lymphadenopathy, no masses or thyromegaly  Chest:  Symmetrical expansion  Lungs:  Clear to auscultation bilaterally, no dullness  Heart:  Regular rhythm, systolic murmur+, no rubs or gallops, No JVD  Abdomen: Soft, non-tender,non distended, no organomegaly, BS+  Musculoskeletal: Normal strength/tone. No edema. No clubbing or cyanosis. Left chest catheter without Dx, redness or tenderness. Rt arm fistula with thrill.  CNS:               AAOx3. Cranial nerves II-XII intact. Grossly normal.No NR  SKIN:               Dry, warm, intact . Healed scabs over extremities and face        Comprehensive Metabolic Profile   Recent Labs      08/05/13   0525  08/04/13   0445  08/03/13   0435   NA  134*  135*  133*   CO2  24  27  24    BUN  44*  37*  50*      No results found for this basename: CALCIUM, PHOSPHORUS, ALBUMIN, TOTPR, ALKPHOS, SGOTAST, SGPTALT, CONJUGATEDF, BILIT,  in the last 72 hours       CBC w/Diff    Recent Labs      08/05/13   0525  08/04/13   0900  08/03/13   0435   WBC  2.8*  2.5*  3.0*   RBC  2.89*  2.67*  2.79*   HCT  25.7*  23.5*  24.5*   MCV  88.9  88.0  87.8   MCH  28.4  28.5  28.7   MCHC  31.9  32.3  32.7   RDW  17.0*  17.0*  17.2*    Recent Labs      08/05/13   0525  08/04/13   0900  08/03/13   0435   MONOS  11*  15*  14*   EOS  6*  4  6*   BASOS  0  0  0   RDW  17.0*  17.0*  17.2*          Imaging-     CXR- Impression: Mildly enlarged cardiac silhouette. There may be mild pulmonary   edema versus chronic lung markings related to sickle cell anemia.     US- IMPRESSION:  No hydronephrosis or suspicious renal masses demonstrated. Increased renal   echogenicity compatible medical renal disease       ---------------------------------------------------------------------------------------------------------------  I have independently examined the patient and reviewed all lab studies and imgaing as well as review of nursing notes and physican notes from the past 24 hours. The plan of care has been discussed with the patient/relative and all questions are answered.     Sherran Needs, MD  August 05, 2013    [facility]  [phone]

## 2013-08-05 NOTE — Other (Signed)
ACUTE HEMODIALYSIS FLOW SHEET    HEMODIALYSIS ORDERS: Physician: Dr. Lockie Mola     Dialyzer:  Revaclear         Duration: 3.5hrs  BFR: 350   DFR 800   Dialysate:  K+  3      Ca+  2.5   Weight:    kg    Bed Scale [x]      Unable to Obtain []       UF Goal:  4000       Heparin []   Bolus      Units    []  Hourly       Units    [x]  None   Pre BP:       Pulse:             Temperature:     Tx: NS           ml/Bolus  Other        []  N/A   Labs: Pre        Post:        [x]  N/A   Additional Orders:           [x]  Time Out/Safety Check  [x]  [facility] Consent Verified     CATHETER ACCESS: [x] N/A   [] Right   [] Left   [] IJ     [] Fem   []  First use X-ray verified     [] Tunnel                []  Non Tunneled   [] No S/S infection  [] Redness  [] Drainage [] Cultured [] Swelling [] Pain   [] Medical Aseptic Prep Utilized   [] Dressing Changed  []  Biopatch  Date:       [] Clotted   [] Patent   Flows: [] Good  [] Poor  [] Reversed   If access problem, Dr. notified: [] Yes    [] N/A  Date:           GRAFT/FISTULA ACCESS:  [] N/A     [x] Right     [] Left     [x] UE     [] LE   [] AVG   [x] AVF        [] Buttonhole    [x] Medical Aseptic Prep Utilized   [x] No S/S infection  [] Redness  [] Drainage [] Cultured [] Swelling [] Pain  Bruit:   [x]  Strong    []  Weak       Thrill :   [x]  Strong    []  Weak       Needle Gauge:15         Length:  1"    If access problem, Dr. notified: [] Yes     [] N/A  Date:             GENERAL ASSESSMENT:   LUNGS:  Sat%           [x]  Clear  []  Coarse  []  Crackles  []  Wheezing        []  Diminished     Location : [] RRL   [] LLL    [] RUL  [] LUL   Cough: [] Productive  [] Dry  [] N/A   Respirations:  [] Easy  [] Labored   Therapy:  [] RA  [x] NC   2      l/min    Mask: [] NRB [] Venti       O2%                  [] Ventilator  [] Intubated  [] Trach   CARDIAC: [x] Regular      []  Irregular   []  Pericardial Rub  []  JVD        []   Monitored  []  N/A  Rhythm:         EDEMA: []  None  [] Generalized  [x]  Pitting []  1    []  2    [x]  3    []  4                 []   Facial  []  Pedal  []   UE  [x]  LE   SKIN:   [x]  Warm  []  Hot     []  Cold   [x]  Dry     []  Pale   []  Diaphoretic                  []  Flushed  []  Jaundiced  []  Cyanotic  []  Rash  []  Weeping   LOC:    [x]  Alert      [x] Oriented:    [x]  Person     [x]  Place  [x] Time               []  Confused  []  Lethargic  []  Medicated  []  Non-responsive     GI / ABDOMEN:  []  Flat    []  Distended    [x]  Soft    []  Firm   []   Obese                             []  Diarrhea  [x]  Bowel Sounds  []  Nausea  []  Vomiting      GU / URINE ASSESSMENT:  []  Voiding   [x]  Oliguria  []  Anuria   []   Foley     []  Incontinent    []   Incontinent Brief      []   Bathroom Privileges     PAIN: [x]  0=None [] 1  [] 2   [] 3   [] 4   [] 5   [] 6   [] 7   [] 8   [] 9   [] 10            Scale 0-10  Action/Follow Up:         MOBILITY:  []  Amb    []  Amb/Assist    [x]  Bed    []  Wheelchair  []  Stretcher      All Vitals and Treatment Details on Attached [facility]:  [facility]         Room # 2107     []  1st Time Acute  []  Stat  []  Routine  []  Urgent     [x]  Acute Room  []   Bedside  []  ICU/CCU  []  ER   Isolation Precautions:  [x]  Dialysis   []  Airborne   []  Contact    []  Reverse   Special Considerations:         []  Blood Consent Verified [] N/A     ALLERGIES:   [x]  NKA           Code Status:  [x]  Full Code  []  DNR  []  Other           HBsAg ONLY: Date Drawn  08/03/13              [x] Negative [] Positive [] Unknown   HBsAb: Date 08/03/13           []  Susceptible   [x]  Immune10 [] Not Drawn      Current Labs:    Date of Labs:                Today [x]   Results for CHRISTOHPER, DUBE (MRN [id number]) as of 08/05/2013 12:24   Ref. Range 08/05/2013 05:25   WBC Latest Range: 4.6-13.2 K/uL 2.8 (L)   RBC Latest Range: 4.70-5.50 M/uL 2.89 (L)   HGB Latest Range: 13.0-16.0 g/dL 8.2 (L)   HCT Latest Range: 36.0-48.0 % 25.7 (L)   MCV Latest Range: 74.0-97.0 FL 88.9   MCH Latest Range: 24.0-34.0 PG 28.4   MCHC Latest Range: 31.0-37.0 g/dL 40.3   RDW Latest Range: 11.6-14.5 % 17.0 (H)   PLATELET  Latest Range: 135-420 K/uL 132 (L)   MPV Latest Range: 9.2-11.8 FL 9.3   NEUTROPHILS Latest Range: 40-73 % 61   LYMPHOCYTES Latest Range: 21-52 % 22   MONOCYTES Latest Range: 3-10 % 11 (H)   EOSINOPHILS Latest Range: 0-5 % 6 (H)   BASOPHILS Latest Range: 0-2 % 0   DF No range found AUTOMATED   ABS. NEUTROPHILS Latest Range: 1.8-8.0 K/UL 1.7 (L)   ABS. LYMPHOCYTES Latest Range: 0.9-3.6 K/UL 0.6 (L)   ABS. MONOCYTES Latest Range: 0.05-1.2 K/UL 0.3   ABS. EOSINOPHILS Latest Range: 0.0-0.4 K/UL 0.2   ABS. BASOPHILS Latest Range: 0.0-0.06 K/UL 0.0   Results for BRYANN, MCNEALY (MRN [id number]) as of 08/05/2013 12:24   Ref. Range 08/05/2013 05:25   Sodium Latest Range: 136-145 mmol/L 134 (L)   Potassium Latest Range: 3.5-5.5 mmol/L 4.4   Chloride Latest Range: 100-108 mmol/L 100   CO2 Latest Range: 21-32 mmol/L 24   Anion gap Latest Range: 3.0-18 mmol/L 10   Glucose Latest Range: 74-99 mg/dL 94   BUN Latest Range: 7.0-18 MG/DL 44 (H)   Creatinine Latest Range: 0.6-1.3 MG/DL 8.75 (H)   BUN/Creatinine ratio Latest Range: 12-20   6 (L)   Calcium Latest Range: 8.5-10.1 MG/DL 8.1 (L)   Phosphorus Latest Range: 2.5-4.9 MG/DL 4.8   Magnesium Latest Range: 1.8-2.4 mg/dL 2.1   GFR est non-AA Latest Range: >60 ml/min/1.74m2 9 (L)   GFR est AA Latest Range: >60 ml/min/1.46m2 11 (L)   Albumin Latest Range: 3.4-5.0 g/dL 2.4 (L)                                                                                                                                 DIET:  [x]  Renal    []  Other     []  NPO     [x]   Diabetic      PRIMARY NURSE REPORT: First initial/Last name/Title      Pre Dialysis:  Dorrene German RN   Time: 1130       EDUCATION:   [x]  Patient []  Other         Knowledge Basis: [] None [] Minimal [] Substantial   [x]  Access Care     [x]  S&S of infection     []  Fluid Management     [] K+     [x] Procedural    [] Albumin     []  Medications     []  Tx  Options     []  Transplant     []  Diet     []  Other   Teaching Tools:  [x]  Explain  []  Demo  []   Handouts []  Video   Inappropriate due to            RO/HEMODIALYSIS MACHINE SAFETY CHECKS ??? Before each treatment:     Machine Number: [x]  Machine/RO #11   (    11       )                                   Alarm Test: [x] Pass           Other:         [x]  RO/Machine Log Complete   Dialysate: pH     7.0  Temp 36.6   [x] Extracorporeal Circuit Tested for integrity   Conductivity: Meter  14  HD Machine 14      CHLORINE TESTING-Before each treatment and every 4 hours    Total Chlorine: [x]  less than 0.1 ppm  Time: 1130    4 Hr Check Time:         (if greater than 0.1 ppm from Primary then every 30 minutes from Secondary)     TREATMENT INITIATION ??? with Dialysis Precautions:   [x]  All Connections Secured                 [x]  Saline Line Double Clamped   [x]  Venous Parameters Set                  [x]  Arterial Parameters Set    [x]  Prime Given  ml  160             [x] Air Foam Detector Engaged      Treatment Initiation Note:  Started treatment using rt arm fistula. Pt calm and vitals stable.      Medication Dose Volume Route Initials Dialyzer Cleared: []  Good [x]  Fair  []  Poor    Blood processed:    L  UF Removed  4000   Ml    Post Wt:     kg  POst BP:  144/86        Pulse: 90      Temperature:97.8                                   Post Tx Vascular Access: AVF/AVG: Bleeding stopped Art  10    min. Ven.  10    Min                                   Catheter: Locking solution: Heparin 1:1000 Art.      Ven.                                     Post Assessment:                                 Skin:    [x]  Warm  [x]  Dry []  Diaphoretic     []  Flushed  []  Pale []  Cyanotic   [facility] Signatures  Hyman Hopes Title      RN Initials       MCO Time Lungs: [x]  Clear    []  Course  []  Crackles  []  Wheezing       Cardiac: [x]  Regular   []  Irregular   []  Monitor  []  N/A  Rhythm:           Edema:  []  None    []  General     []  Facial   []  Pedal    []  UE    [x]  LE       Pain: [] 0=None  [] 1  [] 2   [x] 3  [] 4   [] 5   [] 6   [] 7   [] 8   [] 9    [] 10     Post Treatment Note:   Pt tolerated procedure well.           POST TREATMENT PRIMARY NURSE HANDOFF REPORT:     First initial/Last name/Title         Post Dialysis:   Tanya RN    Time:   1545        Abbreviations: AVG-arterial venous graft, AVF-arterial venous fistula, IJ-Internal Jugular, Subcl-Subclavian, Fem-Femoral, Tx-treatment, AP/HR-apical heart rate, DFR-dialysate flow rate, BFR-blood flow rate, AP-arterial pressure, VP-venous pressure, UF-ultrafiltrate, TMP-transmembrane pressure, Ven-Venous, Art-Arterial, RO-Reverse Osmosis

## 2013-08-05 NOTE — Progress Notes (Signed)
[facility] Pharmacy Dosing Services    Indication: G+ Bacteremia  Day of Therapy: 1  Goal Level(s): 15-20     Temp: 97.9 ??F (36.6 ??C) ()    Serum Creatinine:   Lab Results   Component Value Date/Time    Creatinine 7.18 08/05/2013  5:25 AM     Creatinine Clearance:  Estimated Creatinine Clearance: 16.1 ml/min (based on Cr of 7.18).  BUN:    Lab Results   Component Value Date/Time    BUN 44 08/05/2013  5:25 AM      WBC / C&S:    Lab Results   Component Value Date/Time    WBC 2.8 08/05/2013  5:25 AM    Culture result: NO GROWTH 2 DAYS 08/03/2013  1:22 AM    Culture result: NO GROWTH 2 DAYS 08/03/2013  1:22 AM    Culture result: AEROBIC AND ANAEROBIC BOTTLES 08/03/2013  1:22 AM    Culture result: POSSIBLE 08/03/2013  1:22 AM    Culture result: STAPHYLOCOCCUS SPECIES, COAGULASE NEGATIVE 08/03/2013  1:22 AM    Culture result: CULTURE IN PROGRESS,FURTHER UPDATES TO FOLLOW 08/03/2013  1:22 AM    Culture result: Sent to [facility] for ID/Susceptibility if indicated 08/03/2013  1:22 AM       Other Current Antibiotics:    Current Antimicrobial Therapy   (168h ago through future)    Ordered     Start Stop    08/05/13 0949  vancomycin (VANCOCIN) 750 mg in 0.9% sodium chloride (MBP/ADV) 250 mL ADV  750 mg,   IV,    3 TIMES A WEEK (MON,WED & FRI)      08/05/13 1700 --          Continue with maintenance dose of:  750 mg HD MWF  Plans for level(s): 08/09/13 @ 17:00 per Dialysis protocol.  Pharmacy will follow daily and make changes as needed.      EDITH Lisa Roca, PHARMD, Pharmacist

## 2013-08-05 NOTE — Progress Notes (Signed)
Daily Progress Note    Patient: Tyler Ballard   Age:  32 y.o.  DOA: 08/02/2013   Admit Dx / CC: ARF (acute renal failure)  LOS:  LOS: 3 days       Assessment/ Plan:  Hospital Problems Never Reviewed        ICD-9-CM Class Noted POA    ARF (acute renal failure) 584.9  08/02/2013 Unknown            sickle cell crisis   Hgb electrophoresis 07/30/13 at [facility] normal per record, Hgb S negative, conclusion no evidence of hemoglobinopathy and + ACD, EMR # [id number], DOB [birth year],    Patient signed out AMA at [facility] when switching IV dilaudid 3 mg q3h to PO   Dilaudid prn   IVF   O2NC   Cont outpt Rx   BCx x 4 08/03/13 + x 2 in ED  Awaiting Hgb electrophoresis to confirm finding    HIV + per [facility]   appreciate ID     ESRD-HD MWF  IVF   Consult nephro  Unit for outpatient HD arranged     Hypocalcemia  Defer to nephro    HTN   Adjust outpt Rx     Tobacco   Counseled patient regarding tobacco cessation, educated patient regarding risks including cancer COPD heart and peripheral vascular disease, educated patient regarding Chantix Welbutrin nicotine patches. Patient understands all was told and answered all questions.       Nicotine patch   DVT prophylaxis hep sq   Reason for continued hospitalization arf   Anticipated Date of Discharge: 08/07/13   Anticipated Disposition (home, SNF) : HH    Subjective:   Good po bm   No uo   Diffuse pain   Low grade fever     Review of Systems -   General ROS: denies fatigue malaise   Psychological ROS: denies anxiety depression   Ophthalmic ROS: denies vision changes   ENT ROS: denies sinusitis throat discomfort  Hematological and Lymphatic ROS: denies easy bruising  Respiratory ROS: denies cough, shortness of breath, or wheezing  Cardiovascular ROS: denies chest pain or dyspnea on exertion  Gastrointestinal ROS: denies abdominal pain, nausea vomiting diarrhea melena   Genito-Urinary ROS: denies dysuria, hesitancy, or hematuria  Musculoskeletal ROS: denies myalgias, arthralgias    Neurological ROS: denies focal changes of weakness, sensory changes  Dermatological ROS: denies hives       Objective:     Patient Vitals for the past 24 hrs:   Temp Pulse Resp BP SpO2   08/05/13 1215 - 92 - 147/85 mmHg -   08/05/13 1200 - 90 - 152/80 mmHg -   08/05/13 1145 - 95 - 160/86 mmHg -   08/05/13 0732 97.9 ??F (36.6 ??C) 95 17 148/85 mmHg 92 %   08/05/13 0458 98.5 ??F (36.9 ??C) 97 18 143/88 mmHg 99 %   08/05/13 0035 98.5 ??F (36.9 ??C) 98 17 146/83 mmHg 96 %   08/04/13 1955 99 ??F (37.2 ??C) 95 16 154/87 mmHg 98 %   08/04/13 1516 98.3 ??F (36.8 ??C) 88 15 140/82 mmHg 98 %       Physical Exam:  General appearance: Alert, cooperative, no distress, appears stated age  Head: Normocephalic, without obvious abnormality, atraumatic  Neck: Supple, no lymphadenopathy or thyromegaly, trachea midline  Lungs: Clear to auscultation bilaterally  Heart: Regular rate and rhythm, S1, S2 normal, no murmur, click, rub or gallop  Abdomen: Soft, non-tender and not-distended. Bowel  sounds normal. No masses, no organomegaly  Extremities: Extremities normal, atraumatic, no cyanosis or edema  Skin: Skin color, texture, turgor normal. No rashes or lesions  Neurologic: Grossly normal and non focal, normal muscle tone and power, cranial nerves are intact, normal sensation     Intake and Output:  Current Shift:     Last three shifts:  08/16 1900 - 08/18 0659  In: 880 [P.O.:630; I.V.:250]  Out: -           Jairo Ben, MD  08/05/2013        Hospitalist Service  [facility]   [facility]  [phone] office  Pager [phone]

## 2013-08-06 LAB — METABOLIC PANEL, BASIC
Anion gap: 9 mmol/L (ref 3.0–18)
BUN/Creatinine ratio: 6 — ABNORMAL LOW (ref 12–20)
BUN: 35 MG/DL — ABNORMAL HIGH (ref 7.0–18)
BUN: 33 mg/dl — ABNORMAL HIGH (ref 7–25)
CO2: 26 mmol/L (ref 21–32)
CO2: 25 mEq/L (ref 21–32)
Calcium: 8.3 MG/DL — ABNORMAL LOW (ref 8.5–10.1)
Calcium: 8.2 mg/dl — ABNORMAL LOW (ref 8.5–10.1)
Chloride: 100 mmol/L (ref 100–108)
Chloride: 99 mEq/L (ref 98–107)
Creatinine: 5.68 MG/DL — ABNORMAL HIGH (ref 0.6–1.3)
Creatinine: 5.8 mg/dl — ABNORMAL HIGH (ref 0.6–1.3)
GFR est AA: 15 mL/min/{1.73_m2} — ABNORMAL LOW (ref >60)
GFR est AA: 15
GFR est non-AA: 12 mL/min/{1.73_m2} — ABNORMAL LOW (ref >60)
GFR est non-AA: 12
Glucose: 84 mg/dL (ref 74–99)
Glucose: 78 mg/dl (ref 74–106)
Potassium: 4.3 mmol/L (ref 3.5–5.5)
Potassium: 4.1 mEq/L (ref 3.5–5.1)
Sodium: 135 mmol/L — ABNORMAL LOW (ref 136–145)
Sodium: 134 mEq/L — ABNORMAL LOW (ref 136–145)

## 2013-08-06 LAB — CBC WITH AUTOMATED DIFF
ABS. BASOPHILS: 0 10*3/uL (ref 0.0–0.06)
ABS. EOSINOPHILS: 0.2 10*3/uL (ref 0.0–0.4)
ABS. LYMPHOCYTES: 0.7 10*3/uL — ABNORMAL LOW (ref 0.9–3.6)
ABS. MONOCYTES: 0.4 10*3/uL (ref 0.05–1.2)
ABS. NEUTROPHILS: 2 10*3/uL (ref 1.8–8.0)
BASOPHILS: 1 % (ref 0–2)
BASOPHILS: 0.3 % (ref 0–3)
EOSINOPHILS: 6 % — ABNORMAL HIGH (ref 0–5)
EOSINOPHILS: 6.1 % — ABNORMAL HIGH (ref 0–5)
HCT: 25.3 % — ABNORMAL LOW (ref 36.0–48.0)
HCT: 23.2 % — ABNORMAL LOW (ref 37.0–50.0)
HGB: 8.1 g/dL — ABNORMAL LOW (ref 13.0–16.0)
HGB: 7.5 gm/dl — ABNORMAL LOW (ref 12.4–17.2)
IMMATURE GRANULOCYTES: 0.3 % — ABNORMAL HIGH (ref 0.00–0.00)
LYMPHOCYTES: 21 % (ref 21–52)
LYMPHOCYTES: 21.7 % — ABNORMAL LOW (ref 28–48)
MCH: 28.5 PG (ref 24.0–34.0)
MCH: 29.5 pg (ref 23.0–34.6)
MCHC: 32 g/dL (ref 31.0–37.0)
MCHC: 32.3 gm/dl (ref 30.0–36.0)
MCV: 89.1 FL (ref 74.0–97.0)
MCV: 91.3 fL (ref 80.0–98.0)
MONOCYTES: 13 % — ABNORMAL HIGH (ref 3–10)
MONOCYTES: 11.9 % (ref 1–13)
MPV: 9.8 FL (ref 9.2–11.8)
MPV: 10.3 fL — ABNORMAL HIGH (ref 6.0–10.0)
NEUTROPHILS: 59 % (ref 40–73)
NEUTROPHILS: 59.7 % (ref 34–64)
NRBC: 0 (ref 0–0)
PLATELET: 142 10*3/uL (ref 135–420)
PLATELET: 126 10*3/uL — ABNORMAL LOW (ref 140–450)
RBC: 2.84 M/uL — ABNORMAL LOW (ref 4.70–5.50)
RBC: 2.54 M/uL — ABNORMAL LOW (ref 3.80–5.70)
RDW-SD: 56.6 — ABNORMAL HIGH (ref 35.1–43.9)
RDW: 17 % — ABNORMAL HIGH (ref 11.6–14.5)
WBC: 3.3 10*3/uL — ABNORMAL LOW (ref 4.6–13.2)
WBC: 3.3 10*3/uL — ABNORMAL LOW (ref 4.0–11.0)

## 2013-08-06 LAB — RETICULOCYTE COUNT
Absolute Retic Cnt.: 0.032 M/uL (ref 0.020–0.110)
Immature Retic Fraction: 14.2 % — ABNORMAL HIGH (ref 0.05–0.25)
Retic Hgb Conc.: 28.8 pg (ref 28.2–36.6)
Reticulocyte count: 1.24 % (ref 0.50–1.50)

## 2013-08-06 LAB — GLUCOSE, POC: Glucose (POC): 73 mg/dL (ref 65–105)

## 2013-08-06 MED ADMIN — HYDROmorphone (DILAUDID) injection 3 mg: INTRAVENOUS | @ 04:00:00 | NDC 00641012121

## 2013-08-06 MED ADMIN — HYDROmorphone (DILAUDID) injection 3 mg: INTRAVENOUS | @ 07:00:00 | NDC 00641012121

## 2013-08-06 MED ADMIN — promethazine (PHENERGAN) injection 12.5 mg: INTRAVENOUS | @ 04:00:00 | NDC 00641092821

## 2013-08-06 MED ADMIN — sodium chloride (NS) flush 5-10 mL: INTRAVENOUS | @ 10:00:00 | NDC 87701099893

## 2013-08-06 MED ADMIN — diphenhydrAMINE (BENADRYL) injection 25 mg: INTRAVENOUS | @ 07:00:00 | NDC 00641037621

## 2013-08-06 MED ADMIN — heparin (porcine) injection 5,000 Units: SUBCUTANEOUS | @ 01:00:00 | NDC 25021040201

## 2013-08-06 MED ADMIN — diphenhydrAMINE (BENADRYL) injection 25 mg: INTRAVENOUS | @ 01:00:00 | NDC 00641037621

## 2013-08-06 MED ADMIN — HYDROmorphone (DILAUDID) injection 3 mg: INTRAVENOUS | @ 01:00:00 | NDC 00641012121

## 2013-08-06 MED ADMIN — sodium chloride (NS) flush 5-10 mL: INTRAVENOUS | @ 03:00:00 | NDC 87701099893

## 2013-08-06 NOTE — Discharge Summary (Signed)
Discharge Summary    Patient: Tyler Ballard               Sex: male          DOA: 08/02/2013         Date of Birth:  10/29/1980      Age:  32 y.o.        LOS:  LOS: 4 days                Admit Date: 08/02/2013    Discharge Date: 08/06/2013    Primary Care Physician:    Hospital Course: Tyler Ballard is a 32 y.o. year old male who c/o c/o diffuse myalgia arthralgia N/V/abdominal pain x 2 days 9/10,   In ED found to have ARF .    Malingering, chronic narcotics user   Not sickle cell crisis   Hgb electrophoresis 07/30/13 at [facility] normal per record, Hgb S negative, conclusion no evidence of hemoglobinopathy and + ACD, EMR # [id number], DOB [birth year],   Patient signed out AMA at [facility] when switching IV dilaudid 3 mg q3h to PO   Dilaudid prn   IVF   O2NC   Cont outpt Rx   BCx x 4 08/03/13 + x 2 in ED   Hgb electrophoresis to confirmed patient does not have sickle cell disease since 32 years old  Patient signed out AMA when told good news that he does not have sickle cell disease and will change his narcotics to PO and arrange outpatient follow up at pain clinic    Explained to patient risk of leaving AMA including serious consequences including death, patient insists on leaving AMA    HIV + per [facility] records   appreciate ID     ESRD-HD MWF  Not ARF   IVF   Consult [facility]  Unit for outpatient HD arranged     Hypocalcemia   Defer to nephro     HTN   Adjust outpt Rx     Tobacco   Counseled patient regarding tobacco cessation, educated patient regarding risks including cancer COPD heart and peripheral vascular disease, educated patient regarding Chantix Welbutrin nicotine patches. Patient understands all was told and answered all questions.   Nicotine patch       Discharge Diagnoses:    Problem List as of 08/06/2013 Never Reviewed        ICD-9-CM Class Noted - Resolved    Malingering V65.2  08/06/2013 - Present    Overview    Signed 08/06/2013  7:45 AM by Jairo Ben, MD      Patient states has had sickle  cell disease since age of 52, hemoglobin electrophoresis at [facility] and [facility] both negative for for sickle cell disease          Do not give narcotics V49.89  08/06/2013 - Present    Overview    Signed 08/06/2013  7:46 AM by Jairo Ben, MD      Patient leaves AMA when told changing IV narcotics to PO, at [facility] hospital and [facility]          ARF (acute renal failure) 584.9  08/02/2013 - Present    Overview    Signed 08/06/2013  7:46 AM by Jairo Ben, MD      Is actually ESRD-HD, outpatient arranged by [facility]                Discharge Medications:     Current Discharge Medication List  CONTINUE these medications which have NOT CHANGED    Details   HYDROmorphone (DILAUDID) 8 mg tablet Take 16 mg by mouth every four (4) hours as needed for Pain.      lisinopril (PRINIVIL, ZESTRIL) 10 mg tablet Take 10 mg by mouth daily.      amLODIPine (NORVASC) 10 mg tablet Take  by mouth daily.      hydroxyurea 200 mg cap Take  by mouth.      folic acid 400 mcg tablet Take 400 mcg by mouth daily.             Follow-up: PCP, nephrology, ID     Discharge Condition: Fair    Discharge instruction:  Patient instructed to return to the emergency department if: Chest pain, shortness of breath, altered mental status, fever, chills, nausea, vomiting, abdominal pain or any other concerns.    Activity: Activity as tolerated    Diet: Cardiac Diet, Low fat, Low cholesterol and Renal Diet    Wound Care: Keep wound clean and dry    Labs:  Labs: Results:       Chemistry Recent Labs      08/06/13   0630  08/05/13   0525  08/04/13   0445   GLU  84  94  75   NA  135*  134*  135*   K  4.3  4.4  4.3   CL  100  100  100   CO2  26  24  27    BUN  35*  44*  37*   CREA  5.68*  7.18*  6.09*   CA  8.3*  8.1*  7.5*   AGAP  9  10  8    BUCR  6*  6*  6*   ALB   --   2.4*  2.2*      CBC w/Diff Recent Labs      08/06/13   0630  08/05/13   0525  08/04/13   0900   WBC  3.3*  2.8*  2.5*   RBC  2.84*  2.89*  2.67*   HGB  8.1*   8.2*  7.6*   HCT  25.3*  25.7*  23.5*   PLT  142  132*  121*   GRANS  59  61  57   LYMPH  21  22  24    EOS  6*  6*  4      Cardiac Enzymes No results found for this basename: CPK, CKRMB, CKND1, TROIP, MYO,  in the last 72 hours   Coagulation No results found for this basename: PTP, INR, APTT,  in the last 72 hours    Lipid Panel No results found for this basename: CHOL, CHOLPOCT, [id number], [id number], [id number], CHOLX, CHOLP, CHLST, CHOLV, [id number], HDL, HDLPOCT, [id number], NHDLCT, [id number], HDLC, HDLP, LDL, LDLPOCT, [id number], NLDLCT, DLDL, LDLC, DLDLP, [id number], VLDLC, VLDL, TGL, TGLX, TRIGL, [id number], TRIGP, TGLPOCT, [id number], 884256, CHHD, CHHDX      BNP No results found for this basename: BNPP,  in the last 72 hours   Liver Enzymes Recent Labs      08/05/13   0525   ALB  2.4*      Thyroid Studies No results found for this basename: T4, T3U, T3RU, TSH            Consults: Hospitalist, Infectious Disease, Internal Medicine and Nephrology    Treatment Team: Treatment Team: Attending Provider: Jairo Ben, MD; Consulting Provider: Jairo Ben, MD; Consulting Provider: Yetta Numbers  Fabio Pierce, MD; Consulting Provider: Sherran Needs, MD    Visit Vitals   Item Reading   ??? BP 146/84   ??? Pulse 97   ??? Temp 98.2 ??F (36.8 ??C)   ??? Resp 20   ??? Ht 5\' 5"  (1.651 m)   ??? Wt 100.699 kg (222 lb)   ??? BMI 36.94 kg/m2   ??? SpO2 94%       Discharge Physical Exam:  General appearance: Alert, cooperative, no distress, appears stated age  Head: Normocephalic, without obvious abnormality, atraumatic  Neck: Supple, no lymphadenopathy or thyromegaly, trachea midline  Lungs: Clear to auscultation bilaterally  Heart: Regular rate and rhythm, S1, S2 normal, no murmur, click, rub or gallop  Abdomen: Soft, non-tender and not-distended. Bowel sounds normal. No masses,  no organomegaly  Extremities: Extremities normal, atraumatic, no cyanosis or edema  Skin:  lesions  Neurologic: Grossly normal and non focal, normal muscle tone and power, cranial nerves are  intact, normal sensation       Jairo Ben, MD  August 06, 2013      Hospital Medicine  [facility]  Pager: [phone]    Time Spent: 50 minutes

## 2013-08-06 NOTE — Progress Notes (Signed)
This nurse to patient room to receive report. Pt talking with Dr. Robinette Haines. Patient stated, "I want to sign against medical advice.  It's no need for you to do anything else".  Pt signed AMA form, offered copy and pt refused.

## 2013-08-06 NOTE — ED Provider Notes (Signed)
[facility]  EMERGENCY DEPARTMENT TREATMENT REPORT  NAME:  Tyler Ballard  SEX:   M  ADMIT: 08/06/2013  DOB:   12-17-1981  MR#    [id number]  ROOM:  ER02  TIME SEEN: 10 57 AM  ACCT#  [id number]        I hereby certify this patient for admission based upon medical necessity as   noted below:      TIME OF EVALUATION:  0901    PRIMARY CARE PROVIDER:  Unknown.  [place]  He saw Dr. Marina Goodell, a physician   [place], [place].    CHIEF COMPLAINT:  Sickle cell crisis, vomiting.    HISTORY OF PRESENT ILLNESS:  This is a 32 year old male with past medical history significant for sickle   cell disease presenting to the Emergency Department secondary to worsening   pain.  The patient, however, reports that over the past 3 days he has had   intermittent nausea and vomiting about 4 times daily.  Yesterday started   developing diffuse body aches, mainly at the lower extremity joints and in the   back this morning had one episode of diarrhea, but he is denying any kind of   abdominal pain.  Additionally, the patient states that for the past week he   has had a cough with productive brown sputum.  He otherwise states that his   chest only hurts when he coughs.  No current chest pain.  He denies any   shortness of breath, denies any fever at home.    REVIEW OF SYSTEMS:  CONSTITUTIONAL:  Denies fever.  EYES:   No visual symptoms.  ENT:  Minimal nasal congestion.  RESPIRATORY:  Does report cough, denies shortness of breath.  CARDIOVASCULAR:  Reports chest pain only with cough.  No current chest pain.  GASTROINTESTINAL:  Positive for vomiting, otherwise no abdominal pain, one   episode of loose stools today.  GENITOURINARY:  Does put out urine.  No current urinary symptoms.  MUSCULOSKELETAL:  Positive for diffuse body aches.  INTEGUMENTARY:  No new rashes.  NEUROLOGIC:  No headache.    PAST MEDICAL HISTORY:  The patient does have history of renal disease, was previously on dialysis and    has a fistula to the right upper extremity.  He is no longer on dialysis.    History of sickle cell disease, hypertension.    SOCIAL HISTORY:  Positive for tobacco use.  The patient living in this area with his aunt.    Apparently his mother recently passed away.    FAMILY HISTORY:  Noncontributory.    MEDICATIONS:  Reviewed.    ALLERGIES:  NO KNOWN DRUG ALLERGIES.    PHYSICAL EXAMINATION:  VITAL SIGNS:  Blood pressure 181/86, pulse 108, respirations 25, temperature   99.2, pain 8 out of 10, O2 saturation 91% on room air.  GENERAL:  The patient well nourished, well developed, sitting comfortably on   the stretcher in no apparent distress.  HEENT:  Eyes:  Conjunctivae clear, lids normal.  Pupils equal, symmetrical,   and normally reactive.  ENT:  Mouth:  Mucous membranes moist.  RESPIRATORY:  Clear and equal breath sounds.  No respiratory distress,   tachypnea, or accessory muscle use.    CARDIOVASCULAR:   Heart regular, without murmurs, gallops, rubs, or thrills.    Chest wall symmetric, no masses.  No reproducible tenderness.  Hickman   catheter noted to the left-sided chest wall.   GASTROINTESTINAL:  Normoactive bowel sounds.  Abdomen soft, nontender.  MUSCULOSKELETAL:  Unable to reproduce the patient's joint pain on evaluation.    He does have a fistula noted to the right upper extremity with good thrill.  EXTREMITIES:  Lower extremities do have bilateral edema; however, pulses over   the medial malleolus equal and palpable.  The patient moving all extremities   spontaneously.  NEUROLOGIC:  Alert, oriented.  Sensation intact, motor strength equal and   symmetric.     CONTINUATION BY Dixie Dials, MD:     INITIAL ASSESSMENT:  A patient with sickle cell disease with an acute painful crisis.  He has not   been here before.  We will have to investigate his current level of his   anemia.  This is a new problem for this patient.      RESULTS:    BMP sodium 134, BUN 33, creatinine 5.8, calcium 8.2 otherwise normal.  CBC   white count of 3.3, H&H  7.5 and 23, platelet count 126, otherwise normal.    Retic count is 1.24%.  Chest x-ray shows left lower lobe infiltrate.      MEDICAL DECISION MAKING AND HOSPITAL COURSE:   The patient remained stable throughout is stay here.  He indicates that his   baseline creatinine is between 3 and 4.  He states this was being checked in   prison for the last year.  He says he has had it come up before and has   usually been treated with IV fluids and it goes back down.  When he initially   had renal failure, he had an AV fistula placed and was on dialysis for a short   time.  He has AV fistula in the right arm that looks healthy and has a good   thrill.  The patient has no shortness of breath or cough but does have chest   pain and has what appears to be left lower lobe infiltrate which we will   treat.  He has a retic count that is normal and the pancytopenia.  Perhaps   this represents an aplastic crisis as a result of infection.  He received IV   morphine, Zofran, IV fluids, IV Dilaudid and Benadryl.  I have ordered IV   Levaquin.  I have spoken with Dr. Tasia Catchings who agrees to accept the patient in   admission.    DISPOSITION:  Admitted in satisfactory condition.     DIAGNOSIS:  1.  Acute sickle cell pain crisis.  2.  Acute pneumonia -- community acquired.  3.  Acute-on-chronic renal failure.   4.  Aplastic crisis.  5.  Pancytopenia.      ___________________  Christiana Pellant MD  Dictated By: Kristeen Mans, PA    My signature above authenticates this document and my orders, the final   diagnosis (es), discharge prescription (s), and instructions in the [facility]   [facility] record.    If you have any questions please contact [phone].    [facility]  D:08/06/2013 10:57:21  T: 08/06/2013 11:23:26  [id number]  Authenticated by Carlis Stable. Carmela Hurt, M.D. On 08/17/2013 05:00:18 PM

## 2013-08-06 NOTE — H&P (Signed)
[facility]  History and Physical  NAME:  Tyler Ballard  SEX:   M  ADMIT: 08/06/2013  DOB:[birth year]  MR#    [id number]  ROOM:  [id number]  ACCT#  [id number]    I hereby certify this patient for admission based upon medical necessity as  noted below:    <    CHIEF COMPLAINT:  Generalized diffuse joint pain and aches, with nausea and vomiting over the  last 2 days.    HISTORY OF PRESENT ILLNESS:  This is a kindly 32 year old gentleman who has known sickle cell disease  inherited from his father who had sickle cell disease as well, and who has   been  in his usual state of health.  Taking his folic acid and hydroxyurea twice a  day, although he notes yesterday not being able to take it because of some  ongoing nausea and vomiting issues.  In any event, approximately 3 days ago he  started having worsening joint aches diffusely along with back pain, but no  chest pain.  Denying any productive cough, shortness of breath, and denying   any  dysuria, hematuria, diarrhea, melena, hematochezia or constipation issues.    The  patient notes no headaches or visual changes at this time, but rather ongoing  worsening progressive joint aches and pain, which is sharp in nature and he  attributes this to his previous sickle cell crisis in symptoms.  The patient  denies fever, chills or numbness or weakness at this time.    PAST MEDICAL HISTORY:  1.  Sickle cell disease.  2.  Chronic renal failure, stage IV, with right upper arm AV fistula and prior  dialysis, currently not on dialysis.  3.  Hypertension.  4.  History of prior community-acquired pneumonia in the past.    FAMILY HISTORY:  Positive for father with sickle cell disease.  Mother with ovarian cancer.    SOCIAL HISTORY:  The patient admits to a pack per week tobacco use, but denies any alcohol or  drug use.    REVIEW OF SYSTEMS:  A 10-review of systems was negative, except as in HPI.    ALLERGIES:  NO KNOWN DRUG ALLERGIES.    MEDICATIONS ON ADMISSION:   1.  Hydroxyurea daily.  2.  Folic acid supplementation daily.  3.  Norvasc 5mg  PO daily.  4.  Lisinopril 10mg  PO daily.    PHYSICAL EXAMINATION:  VITAL SIGNS:  Temperature 99.2, pulse 108, respirations 25, blood pressure  181/86, pulse ox is 91% on room air.  EYES:  Pupils equal and round, react to light, brisk and intact.  HEAD:  Atraumatic, normocephalic.  NECK:  Supple, no JVD, no carotid bruits auscultated.    FACE:  Face shows some left mostly facial scarred lesions possibly secondary   to  remote acne.   There is no drainage or infection noted.  HEART:  S1, S2, no murmurs, regular rate and rhythm.  RESPIRATORY:  Clear to auscultation bilaterally except in the bases where some  decreased air breath sounds bilaterally, but no rales or rhonchi audible at  this time.  BACK:  Notes some lower lumbar area tenderness, but no CVA tenderness  bilaterally.  GASTROINTESTINAL:  Soft, slightly distended, obese, but nontender, except on  deeper palpation.  No guarding or rigidity.  Bowel sounds positive.  EXTREMITIES:  Lower extremities show 2+ bilateral edema without cyanosis or  clubbing.  Dorsal pedal pulses are nonpalpable at this time.  NEUROLOGIC:  Grossly nonfocal with  cranial nerves II-XII to be intact.  No  motor or sensory deficits elicited on physical exam.    MUSCULOSKELETAL:  Adequate range of motion of all extremities.  PSYCHIATRIC:  Alert and oriented times 3.  EARS:  No visible external auditory canal drainage or lesions bilaterally.  NARES:  No nasal congestion, nasal drainage or lesions bilaterally.  ORAL CAVITY:  Moist without oral lesions.  GENERAL:  This is a male, appearing of stated age without any obvious cardiac  or respiratory distress at this time.    LABORATORY DATA:  Sodium is 134, potassium 4.1, chloride of 99, bicarbonate 25, glucose 78, BUN  of 33, creatinine of 5.8.  WBC count of 3.3 with hemoglobin of 7.5, hematocrit  of 23.2, platelets are 126. MCV is 91, MCHC is 32.3.  Retic count is 1.24  with  a retic number of 0.032, and a immature reticulocyte fraction of 14.2, and a  reticulocyte hemoglobin of 28.8.  A chest x-ray shows heart to be mildly  enlarged with pulmonary scarring present in both lung bases, left greater than  right, with the upper lungs clear.  Superior mediastinal widening likely  secondary to vascular etiology, with a right subclavian and left  axillary/subclavian stents in place.    IMPRESSION:  This is a kindly 32 year old gentleman who presents with 3 days of worsening  joint pain and intractable nausea and vomiting without evidence of diarrhea,  melena, hematochezia or other GI complaints, but he reports this being more  attributable to his significant pain in multiple joints that he is currently  experiencing, mostly in the hips, knees, ankles, and elbows and shoulders,  without any significant neck pain or worsening lower back pain at this moment  in time.    PLAN:  1.  Acute sickle cell crisis.  We will continue both IV and transitioned to  p.o. Dilaudid use at 4 mg IV every 3 hours initially as needed and continue IV  fluids as well.  Monitoring creatinine and I's and O's and daily weights to   get  the patient back to his baseline.    2.  Possible bronchitis versus early pneumonia.  Would give Rocephin in  addition to azithromycin with Mucinex as needed and repeat the chest x-ray at  some point.  3.  Acute dehydration with chronic kidney disease and prior dialysis with  prerenal azotemia.  Would certainly give IV fluids and monitor BUN and  creatinine at this time.  4.  Normocytic normochromic anemia, anemia of chronic disease.  This is  probably multifactorial with both renal and sickle cell etiology.  Would  monitor calcium at this time and not transfusion unless hemoglobin is less   than  6.  Would certainly continue his hydroxyurea and folic acid supplementation at  this time.  5.  Prophylaxis with Protonix and lower extremity compression devices if able   to tolerate.  Otherwise, would opt for heparin subacute t.i.d.    Total time spent 35 minutes.    PRIMARY CARE:  None.      ___________________  Elenor Quinones MD  Dictated By: .   ST  D:08/06/2013 13:12:46  T: 08/06/2013 13:59:13  [id number]  Authenticated and Edited by Aundria Mems, MD On 08/06/13 7:48:05 PM

## 2013-08-07 LAB — RETICULOCYTE COUNT
Absolute Retic Cnt.: 0.037 M/uL (ref 0.020–0.110)
Immature Retic Fraction: 10 % — ABNORMAL HIGH (ref 0.05–0.25)
Retic Hgb Conc.: 27.8 pg — ABNORMAL LOW (ref 28.2–36.6)
Reticulocyte count: 1.48 % (ref 0.50–1.50)

## 2013-08-07 LAB — METABOLIC PANEL, COMPREHENSIVE
ALT (SGPT): 12 U/L (ref 12–78)
AST (SGOT): 21 U/L (ref 15–37)
Albumin: 2.3 gm/dl — ABNORMAL LOW (ref 3.4–5.0)
Alk. phosphatase: 98 U/L (ref 50–136)
BUN: 38 mg/dl — ABNORMAL HIGH (ref 7–25)
Bilirubin, total: 0.5 mg/dl (ref 0.2–1.0)
CO2: 25 mEq/L (ref 21–32)
Calcium: 8.1 mg/dl — ABNORMAL LOW (ref 8.5–10.1)
Chloride: 103 mEq/L (ref 98–107)
Creatinine: 6.6 mg/dl — ABNORMAL HIGH (ref 0.6–1.3)
GFR est AA: 13
GFR est non-AA: 10
Glucose: 68 mg/dl — ABNORMAL LOW (ref 74–106)
Potassium: 4.3 mEq/L (ref 3.5–5.1)
Protein, total: 9.1 gm/dl — ABNORMAL HIGH (ref 6.4–8.2)
Sodium: 136 mEq/L (ref 136–145)

## 2013-08-07 LAB — CULTURE, BLOOD

## 2013-08-07 LAB — CBC WITH AUTOMATED DIFF
BASOPHILS: 0.4 % (ref 0–3)
EOSINOPHILS: 5.5 % — ABNORMAL HIGH (ref 0–5)
HCT: 23 % — ABNORMAL LOW (ref 37.0–50.0)
HGB: 7.3 gm/dl — ABNORMAL LOW (ref 12.4–17.2)
IMMATURE GRANULOCYTES: 0.4 % — ABNORMAL HIGH (ref 0.00–0.00)
LYMPHOCYTES: 25.4 % — ABNORMAL LOW (ref 28–48)
MCH: 29.3 pg (ref 23.0–34.6)
MCHC: 31.7 gm/dl (ref 30.0–36.0)
MCV: 92.4 fL (ref 80.0–98.0)
MONOCYTES: 14.1 % — ABNORMAL HIGH (ref 1–13)
MPV: 10.9 fL — ABNORMAL HIGH (ref 6.0–10.0)
NEUTROPHILS: 54.2 % (ref 34–64)
NRBC: 0 (ref 0–0)
PLATELET: 118 10*3/uL — ABNORMAL LOW (ref 140–450)
RBC: 2.49 M/uL — ABNORMAL LOW (ref 3.80–5.70)
RDW-SD: 56.2 — ABNORMAL HIGH (ref 35.1–43.9)
WBC: 2.6 10*3/uL — ABNORMAL LOW (ref 4.0–11.0)

## 2013-08-07 NOTE — Consults (Signed)
[facility]  CONSULTATION REPORT  NAME:  SOOY, Awanda Mink  SEX:   M  ADMIT: 08/06/2013  DATE OF CONSULT: 08/07/2013  REFERRING PHYSICIAN:    DOB: [birth year]  MR#    [id number]  ROOM:  0102  ACCT#  [id number]    cc: Arby Barrette MD    REFERRING PHYSICIAN:  Olevia Bowens, MD    REASON FOR REFERRAL:  Worsening renal function.    CHIEF COMPLAINT:  My pain.    PRESENT AND PAST MEDICAL PROBLEMS:  1.  Acute on chronic kidney disease versus progressive stage IV to stage V   chronic kidney disease.  2.  Sickle cell pain crisis.  3.  History of sickle cell anemia.  4.  Left lower lobe pneumonia.  5.  Pancytopenia.  6.  History of chronic kidney disease felt to be secondary to sickle cell   anemia and hypertension.    a) Required hemodialysis in 11/2012 and/or 12/2012, but then discontinued.  b) Status post right upper extremity AV fistula.  7.  Chronic hypertension - 2012.    HISTORY OF PRESENT ILLNESS:  The patient is a 32 year old African American male with the above multiple   medical problems who is referred to nephrology for significant renal   dysfunction.    The patient states that he has a known history of chronic kidney disease and   routinely [place] in [place] .  He states he was actually on   hemodialysis in either late December or early January 2014, but was then taken   off dialysis.  It is unclear whether his renal function improved or he was   released from prison at that time.  He states his baseline creatinine is in   the 2 to 3 range, but also claims that he has not had any lab work drawn for   the last 5 months.  He does have a right upper extremity AV fistula that was   placed at some time.    The patient states that over the past month he has been living in [place]   after his mother died and has been staying with relatives.  Over the past 3   days he has had nausea and vomiting with increasing pain in his joints, which    is typical for his sickle cell pain crisis.  He has also had cough productive   of brown sputum.  There has been no hemoptysis.  He denies any significant   fevers or chills.    The patient presented to the emergency room yesterday where his x-ray was   suggestive of a left lower lobe infiltrate.  He was treated with IV fluids,   morphine, Zofran, Dilaudid, Benadryl and Levaquin.    Review of records shows that his BUN and creatinine yesterday were 33 and 5.8.    After IV fluids today his BUN is 38 with a creatinine of 6.6.  He was on   lisinopril at home.  That has been discontinued.  He denies the use of any   nonsteroidal anti-inflammatory drugs.  There has been no IV contrast   administered.    REVIEW OF SYSTEMS:  He denies any fevers or chills.  He has had mild headache, but no visual   changes.  He denies any chest pain or shortness of breath.  He does have   cough, as noted above, with some brown sputum.  His appetite has been   diminished.  She has had nausea  and vomiting with mild diarrhea.  No   constipation.  He is urinating without difficulty and denies any dysuria or   gross hematuria.  He does have chronic lower extremity edema.    CURRENT MEDICATIONS:  Normal saline at 125 mL per hour, Protonix 40 mg 1 p.o. daily, amlodipine 5 mg   1 p.o. daily, Mucinex 600 mg p.o. q.12 h., ceftriaxone 1 gram IV q.12 h.,   folic acid 1 mg p.o. daily, hydroxyurea 500 mg p.o. b.i.d., azithromycin 500   mg p.o. daily, Dilaudid p.r.n.    ALLERGIES:  NO KNOWN DRUG ALLERGIES.    SOCIAL HISTORY:  Continues to smoke cigarettes approximately 1 pack per week.  His alcohol   consumption is none.  He states he was never a drinker and never used illicit   drugs.  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  He is single.  He has   no children.    FAMILY HISTORY:  His mother died in 08-02-2014from cervical cancer.  His father and sister both    have sickle cell anemia.  He had one uncle on hemodialysis for end-stage   renal disease secondary to diabetic nephropathy.    PHYSICAL EXAMINATION:  GENERAL:  He is an obese African American male in no acute distress.  VITAL SIGNS:  His temperature is 98.1, pulse of 103, respiratory rate 20,   blood pressure 164/94, oxygen saturation 96%.  He is 165 cm tall, 83.9 kg.  HEENT:  His sclerae are clear.  His mouth is without oral lesions.  NECK:  Without JVD or adenopathy.  LUNGS:  Relatively clear to auscultation.  BACK:  Without cva tenderness.  CARDIOVASCULAR:  Regular rhythm with a normal S1 and S2, without murmurs or   rubs.  CHEST:  Reveals a MediPort in place.  ABDOMEN:  Obese but soft and nontender with normal bowel sounds.  EXTREMITIES:  With tense, coarse edema.  His right upper extremity AV fistula   has a palpable thrill and audible bruit.  NEUROLOGIC:  He is alert and oriented to person, place, month and year.  He is   able to move all extremities equally well.    LABORATORY DATA AVAILABLE:   Sodium 136, potassium 4.3, chloride 103, bicarbonate 25, BUN 38, creatinine   6.6, glucose 68.  Total protein 9.1, albumin 2.3, alkaline phosphatase 98, AST   21, ALT 12.    WBC 2.6, hemoglobin 7.3, hematocrit 23.0, platelets 118,000.  Reticulocyte   count 1.48.    Chest x-ray shows evidence of cardiomegaly with bilateral subclavian and   axillary stents with superficial mediastinal widening.    ASSESSMENT:  32.  A 32 year old man with sickle cell anemia admitted for nausea and vomiting   times 3 days.  Also accompanied by sickle cell pain crisis and left lower   lobe pneumonia.  2.  Acute kidney injury on chronic kidney disease versus progressive stage V   chronic kidney disease.  He may have a prerenal component, but this does not   appear to be better after IV fluids.  He still may have developed acute   tubular necrosis due to his overwhelming illness, but there has been no    documented hypotension.  Post-renal causes are a possibility, but less likely.  3.  Stage IV to stage V chronic kidney disease felt to be secondary to sickle   cell anemia  and hypertension.  4.  History of acute need for hemodialysis while he was in the prison hospital   and this either got a bit better so he did not need dialysis, or was   discharged from the prison and did not have any further hemodialysis.  He   states his baseline creatinine was 2 to 3, but also states he has not seen a   nephrologist or had lab work for the past 5 months.  5.  No acute need for hemodialysis at this time, but very close.  6.  Status post right upper extremity AV fistula which appears functional.  7.  Hypertension since 2012 with moderate control at best.  8.  Pancytopenia.  9.  Elevated total protein to albumin ratio, most likely secondary to sickle   cell anemia, but will need to evaluate for paraproteinemia.    RECOMMENDATIONS:  1.  Continue IV fluids as tolerated.  2.  Check urine indices for urine protein to creatinine ratio and fractional   excretion of sodium.  3.  Check for secondary causes of proteinuria.  4.  Renal ultrasound ordered.  5.  No nonsteroidal anti-inflammatory drugs or ACE inhibitors for now.  6.  No acute need for dialysis, but this was discussed with him.  7.  Recheck labs in a.m., including a PTH.    Thank you very much for allowing [facility] to participate   in the care of this patient.   We will continue to monitor with you and make   recommendations as needed.      ___________________  Allyson Sabal MD  Dictated By:.   JMB  D:08/07/2013 11:21:02  T: 08/07/2013 11:50:25  [id number]  Authenticated by Allyson Sabal, M.D. On 08/08/2013 06:09:25 AM

## 2013-08-08 LAB — HIV 1/2 RAPID-EXPOSURE
HIV RAPID: REACTIVE — AB
HIV Rapid: REACTIVE — AB

## 2013-08-08 LAB — RENAL FUNCTION PANEL
Albumin: 2.4 gm/dl — ABNORMAL LOW (ref 3.4–5.0)
BUN: 45 mg/dl — ABNORMAL HIGH (ref 7–25)
CO2: 22 mEq/L (ref 21–32)
Calcium: 8.3 mg/dl — ABNORMAL LOW (ref 8.5–10.1)
Chloride: 102 mEq/L (ref 98–107)
Creatinine: 7.8 mg/dl — ABNORMAL HIGH (ref 0.6–1.3)
GFR est AA: 10
GFR est non-AA: 9
Glucose: 68 mg/dl — ABNORMAL LOW (ref 74–106)
Phosphorus: 4.4 mg/dl (ref 2.5–4.9)
Potassium: 4.5 mEq/L (ref 3.5–5.1)
Sodium: 136 mEq/L (ref 136–145)

## 2013-08-08 LAB — CBC WITH AUTOMATED DIFF
ATYPICAL LYMPHS: 1 % — ABNORMAL HIGH (ref 0–0)
BASOPHILS: 0 % (ref 0–3)
EOSINOPHILS: 3.9 % (ref 0–5)
HCT: 24.2 % — ABNORMAL LOW (ref 37.0–50.0)
HGB: 7.6 gm/dl — ABNORMAL LOW (ref 12.4–17.2)
IMMATURE GRANULOCYTES: 0.4 % — ABNORMAL HIGH (ref 0.00–0.00)
LYMPHOCYTES: 14.6 % — ABNORMAL LOW (ref 28–48)
MCH: 28.9 pg (ref 23.0–34.6)
MCHC: 31.4 gm/dl (ref 30.0–36.0)
MCV: 92 fL (ref 80.0–98.0)
MONOCYTES: 10.7 % (ref 1–13)
MPV: 11.1 fL — ABNORMAL HIGH (ref 6.0–10.0)
NEUTROPHILS: 69.9 % — ABNORMAL HIGH (ref 34–64)
NRBC: 0 (ref 0–0)
PLATELET COMMENTS: DECREASED
PLATELET: 125 10*3/uL — ABNORMAL LOW (ref 140–450)
RBC: 2.63 M/uL — ABNORMAL LOW (ref 3.80–5.70)
RDW-SD: 56 — ABNORMAL HIGH (ref 35.1–43.9)
WBC: 2.8 10*3/uL — ABNORMAL LOW (ref 4.0–11.0)

## 2013-08-08 LAB — MAGNESIUM: Magnesium: 2 mg/dl (ref 1.8–2.4)

## 2013-08-09 LAB — HEPATITIS C ANTIBODY
HCV Ab: NONREACTIVE
SIGNAL TO CUTOFF (HEP C): 0

## 2013-08-09 LAB — HEPATITIS B SURFACE ANTIBODY: Hep B S Ab: NONREACTIVE

## 2013-08-09 LAB — RENAL FUNCTION PANEL
Albumin: 2.3 gm/dl — ABNORMAL LOW (ref 3.4–5.0)
BUN: 36 mg/dl — ABNORMAL HIGH (ref 7–25)
CO2: 26 mEq/L (ref 21–32)
Calcium: 8.2 mg/dl — ABNORMAL LOW (ref 8.5–10.1)
Chloride: 101 mEq/L (ref 98–107)
Creatinine: 6.7 mg/dl — ABNORMAL HIGH (ref 0.6–1.3)
GFR est AA: 12
GFR est non-AA: 10
Glucose: 96 mg/dl (ref 74–106)
Phosphorus: 3.9 mg/dl (ref 2.5–4.9)
Potassium: 4.1 mEq/L (ref 3.5–5.1)
Sodium: 136 mEq/L (ref 136–145)

## 2013-08-09 LAB — CBC WITH AUTOMATED DIFF
BASOPHILS: 0.8 % (ref 0–3)
EOSINOPHILS: 6.6 % — ABNORMAL HIGH (ref 0–5)
HCT: 23.3 % — ABNORMAL LOW (ref 37.0–50.0)
HGB: 7.3 gm/dl — ABNORMAL LOW (ref 12.4–17.2)
IMMATURE GRANULOCYTES: 0 % (ref 0.00–0.00)
LYMPHOCYTES: 28.9 % (ref 28–48)
MCH: 28.2 pg (ref 23.0–34.6)
MCHC: 31.3 gm/dl (ref 30.0–36.0)
MCV: 90 fL (ref 80.0–98.0)
MONOCYTES: 15.3 % — ABNORMAL HIGH (ref 1–13)
MPV: 10.7 fL — ABNORMAL HIGH (ref 6.0–10.0)
NEUTROPHILS: 48.4 % (ref 34–64)
NRBC: 0 (ref 0–0)
PLATELET: 121 10*3/uL — ABNORMAL LOW (ref 140–450)
RBC: 2.59 M/uL — ABNORMAL LOW (ref 3.80–5.70)
RDW-SD: 54.7 — ABNORMAL HIGH (ref 35.1–43.9)
WBC: 2.4 10*3/uL — ABNORMAL LOW (ref 4.0–11.0)

## 2013-08-09 LAB — HEPATITIS C AB
Hepatitis C virus Ab: NONREACTIVE
Signal to Cutoff (Hep C): 0

## 2013-08-09 LAB — COMPLEMENT, C3 & C4
COMPLEMENT,C4: 13 mg/dL — ABNORMAL LOW (ref 16–47)
Complement C3: 81 mg/dL — ABNORMAL LOW (ref 90–180)

## 2013-08-09 LAB — CULTURE, BLOOD
Culture result:: NO GROWTH
Culture result:: NO GROWTH

## 2013-08-09 LAB — IRON PROFILE
Iron % saturation: 32 % (ref 20–45)
Iron: 41 ug/dL — ABNORMAL LOW (ref 65–175)
TIBC: 130 ug/dL — ABNORMAL LOW (ref 250–450)

## 2013-08-09 LAB — HEP B SURFACE AG: Hepatitis B surface Ag: NONREACTIVE

## 2013-08-09 LAB — PTH INTACT: PTH, Intact: 1081.4 pg/ml — ABNORMAL HIGH (ref 14.0–72.0)

## 2013-08-09 LAB — HEP B SURFACE AB: Hepatitis B surface Ab: NONREACTIVE

## 2013-08-09 LAB — FERRITIN: Ferritin: 634.2 ng/ml — ABNORMAL HIGH (ref 26.0–388.0)

## 2013-08-10 LAB — RPR W/REFLEX TITER AND CONFIRMATION
RPR: NONREACTIVE
RPR: NONREACTIVE

## 2013-08-10 LAB — GLUCOSE-6-PHOSPHATE DEHYDROGENASE
G-6-PD, BLOOD, 001920: 2.7 U/g{Hb} — ABNORMAL LOW (ref 4.6–13.5)
G-6-PD: 2.7 U/g{Hb} — ABNORMAL LOW (ref 4.6–13.5)

## 2013-08-10 LAB — HIV-1 RNA QT BY PCR
Copies/ML: 33577 copies/mL — ABNORMAL HIGH (ref <20)
Copies/mL: 33577 copies/mL — ABNORMAL HIGH (ref <20)
LOG COPIES/ML: 4.53 Log cps/mL — ABNORMAL HIGH (ref <1.30)
Log copies/mL: 4.53 Log cps/mL — ABNORMAL HIGH (ref <1.30)

## 2013-08-10 LAB — CULTURE, BLOOD

## 2013-08-10 LAB — PROTEIN ELECTROPHORESIS
ALPHA-2 GLOBULIN: 0.7 g/dL (ref 0.5–1.0)
Abnormal Protein Band 1: NOT DETECTED
Albumin: 3.4 g/dL — ABNORMAL LOW (ref 3.5–4.7)
Alpha-1-globulin: 0.2 g/dL (ref 0.1–0.3)
Beta globulin: 0.7 g/dL — ABNORMAL LOW (ref 0.8–1.4)
Gamma globulin: 4.3 g/dL — ABNORMAL HIGH (ref 0.6–1.6)
Protein, total: 9.3 g/dL — ABNORMAL HIGH (ref 6.1–8.1)

## 2013-08-11 LAB — METABOLIC PANEL, COMPREHENSIVE
ALT (SGPT): 13 U/L (ref 12–78)
AST (SGOT): 26 U/L (ref 15–37)
Albumin: 2.6 gm/dl — ABNORMAL LOW (ref 3.4–5.0)
Alk. phosphatase: 98 U/L (ref 50–136)
BUN: 32 mg/dl — ABNORMAL HIGH (ref 7–25)
Bilirubin, total: 0.7 mg/dl (ref 0.2–1.0)
CO2: 26 mEq/L (ref 21–32)
Calcium: 8.5 mg/dl (ref 8.5–10.1)
Chloride: 99 mEq/L (ref 98–107)
Creatinine: 6.8 mg/dl — ABNORMAL HIGH (ref 0.6–1.3)
GFR est AA: 12
GFR est non-AA: 10
Glucose: 76 mg/dl (ref 74–106)
Potassium: 4.4 mEq/L (ref 3.5–5.1)
Protein, total: 9.6 gm/dl — ABNORMAL HIGH (ref 6.4–8.2)
Sodium: 135 mEq/L — ABNORMAL LOW (ref 136–145)

## 2013-08-11 LAB — CBC WITH AUTOMATED DIFF
BASOPHILS: 0 % (ref 0–3)
EOSINOPHILS: 2.9 % (ref 0–5)
HCT: 27.1 % — ABNORMAL LOW (ref 37.0–50.0)
HGB: 8.3 gm/dl — ABNORMAL LOW (ref 12.4–17.2)
IMMATURE GRANULOCYTES: 0.5 % — ABNORMAL HIGH (ref 0.00–0.00)
LYMPHOCYTES: 19.4 % — ABNORMAL LOW (ref 28–48)
MCH: 28.3 pg (ref 23.0–34.6)
MCHC: 30.6 gm/dl (ref 30.0–36.0)
MCV: 92.5 fL (ref 80.0–98.0)
MONOCYTES: 11.2 % (ref 1–13)
MPV: 10.8 fL — ABNORMAL HIGH (ref 6.0–10.0)
NEUTROPHILS: 66 % — ABNORMAL HIGH (ref 34–64)
NRBC: 0 (ref 0–0)
PLATELET: 133 10*3/uL — ABNORMAL LOW (ref 140–450)
RBC: 2.93 M/uL — ABNORMAL LOW (ref 3.80–5.70)
RDW-SD: 56.7 — ABNORMAL HIGH (ref 35.1–43.9)
WBC: 3.8 10*3/uL — ABNORMAL LOW (ref 4.0–11.0)

## 2013-08-11 LAB — CULTURE, BLOOD: Culture result:: NO GROWTH

## 2013-08-12 LAB — CBC WITH AUTOMATED DIFF
BASOPHILS: 0.3 % (ref 0–3)
EOSINOPHILS: 4.3 % (ref 0–5)
HCT: 25.7 % — ABNORMAL LOW (ref 37.0–50.0)
HGB: 8.1 gm/dl — ABNORMAL LOW (ref 12.4–17.2)
IMMATURE GRANULOCYTES: 0.3 % — ABNORMAL HIGH (ref 0.00–0.00)
LYMPHOCYTES: 23.5 % — ABNORMAL LOW (ref 28–48)
MCH: 29.3 pg (ref 23.0–34.6)
MCHC: 31.5 gm/dl (ref 30.0–36.0)
MCV: 93.1 fL (ref 80.0–98.0)
MONOCYTES: 14.5 % — ABNORMAL HIGH (ref 1–13)
MPV: 10.8 fL — ABNORMAL HIGH (ref 6.0–10.0)
NEUTROPHILS: 57.1 % (ref 34–64)
NRBC: 0 (ref 0–0)
PLATELET: 130 10*3/uL — ABNORMAL LOW (ref 140–450)
RBC: 2.76 M/uL — ABNORMAL LOW (ref 3.80–5.70)
RDW-SD: 57.7 — ABNORMAL HIGH (ref 35.1–43.9)
WBC: 3.5 10*3/uL — ABNORMAL LOW (ref 4.0–11.0)

## 2013-08-12 LAB — RENAL FUNCTION PANEL
Albumin: 2.5 gm/dl — ABNORMAL LOW (ref 3.4–5.0)
BUN: 40 mg/dl — ABNORMAL HIGH (ref 7–25)
CO2: 25 mEq/L (ref 21–32)
Calcium: 8.4 mg/dl — ABNORMAL LOW (ref 8.5–10.1)
Chloride: 98 mEq/L (ref 98–107)
Creatinine: 7.8 mg/dl — ABNORMAL HIGH (ref 0.6–1.3)
GFR est AA: 10
GFR est non-AA: 9
Glucose: 156 mg/dl — ABNORMAL HIGH (ref 74–106)
Phosphorus: 4.7 mg/dl (ref 2.5–4.9)
Potassium: 4.3 mEq/L (ref 3.5–5.1)
Sodium: 133 mEq/L — ABNORMAL LOW (ref 136–145)

## 2013-08-12 LAB — LYMPHOCYTES, CD4 PERCENT AND ABSOLUTE
% CD4:: 30 % (ref 30–61)
Abs CD4:: 144 /uL — ABNORMAL LOW (ref 490–1740)
Abs Lymphocytes: 476 /uL — ABNORMAL LOW (ref 850–3900)

## 2013-08-12 LAB — MAGNESIUM: Magnesium: 2 mg/dl (ref 1.8–2.4)

## 2013-08-13 LAB — TOXOPLASMA GONDII AB, IGG: Toxoplasma gondii AB, IgG, QT: <=0.9 (ref <=0.90)

## 2013-08-13 LAB — ANTINUCLEAR ANTIBODIES, IFA: Antinuclear Abs, IFA: NEGATIVE

## 2013-08-15 LAB — HIV-1 GENOTYPING WITH VIRTUAL PHENOTYPING

## 2013-08-20 LAB — HIV 1 WESTERN BLOT

## 2013-09-04 ENCOUNTER — Inpatient Hospital Stay
Admit: 2013-09-04 | Discharge: 2013-09-06 | Discharge status: Against Medical Advice | Payer: MEDICARE | Attending: Internal Medicine | Admitting: Internal Medicine

## 2013-09-04 DIAGNOSIS — I120 Hypertensive chronic kidney disease with stage 5 chronic kidney disease or end stage renal disease: Secondary | ICD-10-CM

## 2013-09-04 LAB — POC CHEM8
Anion gap (POC): 21 mmol/L — ABNORMAL HIGH (ref 5–15)
BUN (POC): 43 MG/DL — ABNORMAL HIGH (ref 9–20)
CO2 (POC): 20 MMOL/L — ABNORMAL LOW (ref 21–32)
Calcium, ionized (POC): 1.04 MMOL/L — ABNORMAL LOW (ref 1.12–1.32)
Chloride (POC): 105 MMOL/L (ref 98–107)
Creatinine (POC): 7.2 MG/DL — ABNORMAL HIGH (ref 0.6–1.3)
GFRAA, POC: 11 mL/min/{1.73_m2} — ABNORMAL LOW (ref >60)
GFRNA, POC: 9 mL/min/{1.73_m2} — ABNORMAL LOW (ref >60)
Glucose (POC): 84 MG/DL (ref 75–110)
Hematocrit (POC): 27 % — ABNORMAL LOW (ref 36.6–50.3)
Hemoglobin (POC): 9.2 GM/DL — ABNORMAL LOW (ref 12.1–17.0)
Potassium (POC): 4.5 MMOL/L (ref 3.5–5.1)
Sodium (POC): 140 MMOL/L (ref 136–145)

## 2013-09-04 MED ADMIN — diphenhydrAMINE (BENADRYL) 25 mg in dextrose 5% 50 mL IVPB: INTRAVENOUS | NDC 00641037625

## 2013-09-04 MED ADMIN — ondansetron (ZOFRAN) injection 4 mg: INTRAVENOUS | NDC 23155037831

## 2013-09-04 MED ADMIN — hydromorphone (PF) (DILAUDID) injection 1 mg: INTRAVENOUS | NDC 00409255201

## 2013-09-04 MED ADMIN — diphenhydrAMINE (BENADRYL) injection 25 mg: INTRAVENOUS | @ 23:00:00 | NDC 57866728801

## 2013-09-04 NOTE — ED Notes (Signed)
Bedside and Verbal shift change report given to Florentina Addison RN (oncoming nurse) by Chester Holstein (offgoing nurse). Report included the following information SBAR.

## 2013-09-04 NOTE — Progress Notes (Signed)
[facility]: MED RECONCILIATION    Information obtained from: Patient       Comments/Recommendations:     - Last took most medications on Monday (09/02/13).    - Phoslo:  Not taken in awhile.  Needs new refill  - Scr 7.2-  Recommend holding lisinopril  - Per pt, ok to substitute Nephro-vite with similar formulary medication    Significant PMH/Disease States:   Past Medical History   Diagnosis Date   ??? Other ill-defined conditions      sickle cell disease   ??? Other ill-defined conditions      Rt hip AVN   ??? Other ill-defined conditions      sickle cell   ??? Sickle cell anemia    ??? HTN (hypertension) 07/06/2012   ??? Sickle cell anemia    ??? Chronic kidney disease (CKD), stage V    ??? Ill-defined condition      sickle cell anemia   ??? Hypertension        Chief Complaint for this Admission:   Chief Complaint   Patient presents with   ??? Sickle Cell Crisis       Allergies: Review of patient's allergies indicates no known allergies.    Prior to Admission Medications:     Medication Documentation Review Audit    Reviewed by Emeline Darling, PHARMD (Pharmacist) on 09/04/13 at 2042      Medication Sig Documenting Provider Last Dose Status Taking?    ondansetron (ZOFRAN ODT) 8 mg disintegrating tablet Take 1 Tab by mouth every eight (8) hours as needed for Nausea. Gaspar Bidding, MD 08/04/2013 Unknown time Active Yes    calcium acetate (PHOSLO) 667 mg cap Take 2 Caps by mouth three (3) times daily (with meals). Phys Other, MD 08/28/2013 Unknown time Active Yes    b complex-vitamin c-folic acid 0.8 mg (NEPHRO-VITE) 0.8 mg Tab tablet Take 1 Tab by mouth daily. Phys Other, MD 09/02/2013 AM Active Yes    promethazine (PHENERGAN) 25 mg tablet Take 25 mg by mouth every four (4) hours as needed. Phys Other, MD 08/04/2013 Unknown time Active Yes    lisinopril (PRINIVIL, ZESTRIL) 10 mg tablet Take 10 mg by mouth daily. Phys Other, MD 09/02/2013 AM Active Yes    hydroxyurea (HYDREA) 500 mg capsule Take 500 mg by mouth daily. Phys Other, MD 09/02/2013  AM Active Yes    HYDROmorphone (DILAUDID) 8 mg tablet Take 16 mg by mouth every four (4) hours as needed for Pain. Phys Other, MD 08/28/2013 Unknown time Active Yes    diphenhydrAMINE (BENADRYL) 50 mg tablet Take 50 mg by mouth every eight (8) hours as needed for Sleep. Phys Other, MD 08/28/2013 Unknown time Active Yes    amLODIPine (NORVASC) 5 mg tablet Take 5 mg by mouth daily. Phys Other, MD 09/02/2013 AM Active Yes    folic acid (FOLVITE) 1 mg tablet Take 1 mg by mouth daily. Phys Other, MD 09/02/2013 AM Active Yes    HYDROmorphone (DILAUDID) 8 mg tablet Take 16 mg by mouth every three (3) hours as needed for Pain. Phys Other, MD 08/28/2013 Unknown time Active Yes                Thank you,  Cheree Ditto, Pharm.D.  [phone]

## 2013-09-04 NOTE — ED Notes (Signed)
Pt to floor. AOX3. NAD.

## 2013-09-04 NOTE — Progress Notes (Signed)
TRANSFER - IN REPORT:    Verbal report received from [place], RN(name) on [employer]  being received from ED(unit) for routine progression of care      Report consisted of patient???s Situation, Background, Assessment and   Recommendations(SBAR).     Information from the following report(s) SBAR and Kardex was reviewed with the receiving nurse.    Opportunity for questions and clarification was provided.      Assessment completed upon patient???s arrival to unit and care assumed.

## 2013-09-04 NOTE — ED Notes (Signed)
Pt in radiology

## 2013-09-04 NOTE — ED Notes (Signed)
TRANSFER - OUT REPORT:    Verbal report given to [facility] RN(name) on [employer]  being transferred to room 517(unit) for routine progression of care       Report consisted of patient???s Situation, Background, Assessment and   Recommendations(SBAR).     Information from the following report(s) SBAR was reviewed with the receiving nurse.    Opportunity for questions and clarification was provided.

## 2013-09-04 NOTE — ED Notes (Signed)
Pt returned to room from radiology. Pt reports pain decreased to 8/10. Requesting more pain medication

## 2013-09-04 NOTE — H&P (Addendum)
[facility]  [address] [place], [place]  [address]  [phone]    Admission History and Physical      NAME:  Tyler Ballard   DOB:   12-29-1980   MRN:  [id number]     PCP:  Sol Blazing, MD     Date/Time:  09/04/2013         Subjective:     CHIEF COMPLAINT: leg pain, nausea and vomiting     HISTORY OF PRESENT ILLNESS:     Tyler Ballard is a 32 y.o.  African American male who is admitted with sickle cell crisis. Tyler Ballard with Hx of sickle cell disease, HTN, ESRD, DM not on meds presented to ER c/o leg pain, nausea and vomiting for 2 days. He was last admitted to a hospital about 3 months ago for similar. Denies fever. Has cough, which is productive of brown sputum. Pt was hemo dialysis and according to him, dialysis was stopped as his creatinine "normalized". He follows in a free clinic at [facility].    Past Medical History   Diagnosis Date   ??? Other ill-defined conditions      sickle cell disease   ??? Other ill-defined conditions      Rt hip AVN   ??? Other ill-defined conditions      sickle cell   ??? Sickle cell anemia    ??? HTN (hypertension) 07/06/2012   ??? Sickle cell anemia    ??? Chronic kidney disease (CKD), stage V    ??? Ill-defined condition      sickle cell anemia   ??? Hypertension         Past Surgical History   Procedure Laterality Date   ??? Hx orthopaedic  2012     Rt hip decompression   ??? Hx orthopaedic       rt hip    ??? Hx vascular access       av  fistula right arm.   ??? Hx orthopaedic       RIGHT HIP   ??? Hx vascular access       old avg left arm, avf right arm   ??? Hx orthopaedic       right hip surgery       History   Substance Use Topics   ??? Smoking status: Current Every Day Smoker -- 0.25 packs/day for 10 years   ??? Smokeless tobacco: Not on file   ??? Alcohol Use: No        Family History   Problem Relation Age of Onset   ??? Sickle Cell Anemia Sister    ??? Cancer Mother    ??? Hypertension Mother    ??? Hypertension Father         No Known Allergies     Prior to Admission  medications    Medication Sig Start Date End Date Taking? Authorizing Provider   amLODIPine (NORVASC) 5 mg tablet Take 5 mg by mouth daily.   Yes Phys Other, MD   folic acid (FOLVITE) 1 mg tablet Take 1 mg by mouth daily.   Yes Phys Other, MD   HYDROmorphone (DILAUDID) 8 mg tablet Take 16 mg by mouth every three (3) hours as needed for Pain.   Yes Phys Other, MD   lisinopril (PRINIVIL, ZESTRIL) 10 mg tablet Take 10 mg by mouth daily.   Yes Phys Other, MD   hydroxyurea (HYDREA) 500 mg capsule Take 500 mg by mouth daily.  Yes Phys Other, MD   HYDROmorphone (DILAUDID) 8 mg tablet Take 16 mg by mouth every four (4) hours as needed for Pain.   Yes Phys Other, MD   diphenhydrAMINE (BENADRYL) 50 mg tablet Take 50 mg by mouth every eight (8) hours as needed for Sleep.   Yes Phys Other, MD   ondansetron (ZOFRAN ODT) 8 mg disintegrating tablet Take 1 Tab by mouth every eight (8) hours as needed for Nausea. 07/05/12  Yes Gaspar Bidding, MD   calcium acetate (PHOSLO) 667 mg cap Take 2 Caps by mouth three (3) times daily (with meals).   Yes Phys Other, MD   b complex-vitamin c-folic acid 0.8 mg (NEPHRO-VITE) 0.8 mg Tab tablet Take 1 Tab by mouth daily.   Yes Phys Other, MD   promethazine (PHENERGAN) 25 mg tablet Take 25 mg by mouth every four (4) hours as needed.   Yes Phys Other, MD         Review of Systems:  (bold if positive, if negative)    Gen:  Eyes:  ENT:  CVS:  Pulm:  CoughGI:  nausea, emesis,  GU:    MS:  Skin:  Psych:  Endo:    Hem:  Renal:    Neuro:            Objective:      VITALS:    Vital signs reviewed; most recent are:    Visit Vitals   Item Reading   ??? BP 224/104   ??? Pulse 100   ??? Temp 97.8 ??F (36.6 ??C)   ??? Resp 20   ??? Ht 5\' 5"  (1.651 m)   ??? Wt 81.647 kg (180 lb)   ??? BMI 29.95 kg/m2   ??? SpO2 99%     SpO2 Readings from Last 6 Encounters:   09/04/13 99%   07/17/13 100%   07/12/13 92%   05/26/13 95%   04/25/13 97%   03/29/13 98%        No intake or output data in the 24 hours ending 09/04/13 2053          Exam:     Physical Exam:    Gen:  Well-developed, well-nourished, in no acute distress  HEENT:  Pink conjunctivae, PERRL, hearing intact to voice, moist mucous membranes  Neck:  Supple, without masses, thyroid non-tender  Resp:  No accessory muscle use, clear breath sounds without wheezes rales or rhonchi  Card:  No murmurs, normal S1, S2 without thrills, bruits. +  peripheral edema  Abd:  Soft, non-tender, non-distended, normoactive bowel sounds are present, no palpable organomegaly and no detectable hernias  Lymph:  No cervical or inguinal adenopathy  Musc:  No cyanosis or clubbing  Skin:  No rashes or ulcers, skin turgor is good  Neuro:  Cranial nerves are grossly intact, no focal motor weakness, follows commands appropriately  Psych:  Good insight, oriented to person, place and time, alert       Labs:    Recent Labs      09/04/13   1930   WBC  4.1   HGB  8.7*   HCT  27.1*   PLT  118*     No results found for this basename: NA, K, CL, CO2, GLU, BUN, CREA, CA, MG, PHOS, ALB, TBIL, TBILI, SGOT, ALT,  in the last 72 hours  Lab Results   Component Value Date/Time    Glucose (POC) 84 09/04/2013  7:35 PM    Glucose (POC) 94 07/10/2012  12:36 PM    Glucose (POC) 119 07/10/2012  6:57 AM     No results found for this basename: PH, PCO2, PO2, HCO3, FIO2,  in the last 72 hours  No results found for this basename: INR,  in the last 72 hours    Telemetry reviewed:   NSR       Assessment/Plan:    1.  Sickle cell crisis. Unknown what triggered it. Has no fever or leukocytosis. Continue pain medication, hydroxyurea.     2.  HTN not well controlled. unclear if he is complaint with his meds. For now continue his home meds. Amlodipine, lisinopril.    3.  ESRD. Pt stated that HD was stopped about 5 months ago by his nephrologist at [facility] as his creatine got better. I don't know if this is true story. Will consult renal. Has AV graft    4.  Nausea and vomiting. Symptomatic treatment with anti emetics    5.  DM with renal complication. Pt  stated that he is not on diabetic medication. Check A1c and diabetic diet.      6.  Anemia. Most likely secondary to sickle cell/ ESRD. Stable H/H. Monitor closely.       Active Problems:    Type II or unspecified type diabetes mellitus with renal manifestations, not stated as uncontrolled(250.40) (06/28/2012)      Sickle cell anemia with pain (07/05/2012)      HTN (hypertension) (07/06/2012)      Arthralgia (03/29/2013)      ESRD (end stage renal disease) (09/04/2013)      Nausea & vomiting (09/04/2013)           Risk of deterioration: high      Total time spent with patient: 76 Minutes                  Care Plan discussed with: Patient, Nursing Staff and >50% of time spent in counseling and coordination of care    Discussed:  Care Plan    Prophylaxis:  Hep SQ    Probable Disposition:  Home w/Family           ___________________________________________________    Attending Physician: Cleotis Nipper, MD

## 2013-09-04 NOTE — ED Provider Notes (Signed)
HPI Comments: Pt reports 3 days aof n/v and being unable to keep pain meds down. Began 2 days ago with pain in extremities and lower back. Denies cp or fever but reports he also has a cough. Reports hx esrd and was on dialysis but has not been on for months as his kidney function improved and he began making urine. [facility] Hx gastroperesis and scc    Patient is a 32 y.o. male presenting with sickle cell disease. The history is provided by the patient.   Sickle Cell Crisis   This is a new problem. The current episode started more than 2 days ago. The problem has not changed since onset.The problem occurs constantly. The pain is associated with no known injury. The pain is present in the lumbar spine. The quality of the pain is described as aching. The pain radiates to the left thigh, right knee, left knee, right foot and left foot. The pain is at a severity of 8/10. The pain is severe. The pain is the same all the time. Pertinent negatives include no chest pain, no fever, no abdominal pain, no abdominal swelling and no weakness. Treatments tried: dilaudid 16 mg po. The treatment provided no relief. The patient's surgical history non-contributory        Past Medical History   Diagnosis Date   ??? Other ill-defined conditions      sickle cell disease   ??? Other ill-defined conditions      Rt hip AVN   ??? Other ill-defined conditions      sickle cell   ??? Sickle cell anemia    ??? HTN (hypertension) 07/06/2012   ??? Sickle cell anemia    ??? Chronic kidney disease (CKD), stage V    ??? Ill-defined condition      sickle cell anemia   ??? Hypertension         Past Surgical History   Procedure Laterality Date   ??? Hx orthopaedic  2012     Rt hip decompression   ??? Hx orthopaedic       rt hip    ??? Hx vascular access       av  fistula right arm.   ??? Hx orthopaedic       RIGHT HIP   ??? Hx vascular access       old avg left arm, avf right arm   ??? Hx orthopaedic       right hip surgery         Family History    Problem Relation Age of Onset   ??? Sickle Cell Anemia Sister    ??? Cancer Mother    ??? Hypertension Mother    ??? Hypertension Father         History     Social History   ??? Marital Status: SINGLE     Spouse Name: N/A     Number of Children: N/A   ??? Years of Education: N/A     Occupational History   ??? Not on file.     Social History Main Topics   ??? Smoking status: Current Every Day Smoker -- 0.25 packs/day for 10 years   ??? Smokeless tobacco: Not on file   ??? Alcohol Use: No   ??? Drug Use: No   ??? Sexually Active: Not on file     Other Topics Concern   ??? Not on file     Social History Narrative    **  Merged History Encounter **         ** Merged History Encounter **         ** Merged History Encounter **                       ALLERGIES: Review of patient's allergies indicates no known allergies.      Review of Systems   Constitutional: Negative for fever.   Respiratory: Positive for cough. Negative for shortness of breath.    Cardiovascular: Negative for chest pain.   Gastrointestinal: Positive for nausea, vomiting and diarrhea. Negative for abdominal pain.   Neurological: Negative for weakness.   All other systems reviewed and are negative.        Filed Vitals:    09/04/13 1823 09/04/13 1824 09/04/13 1908   BP:  224/104    Pulse: 100     Temp: 97.8 ??F (36.6 ??C)     Resp: 20     Height: 5\' 5"  (1.651 m)     Weight: 81.647 kg (180 lb)     SpO2: 94%  99%            Physical Exam   Nursing note and vitals reviewed.  Constitutional: He is oriented to person, place, and time. He appears well-developed and well-nourished. No distress.   HENT:   Head: Normocephalic and atraumatic.   Eyes: Conjunctivae are normal.   Neck: Normal range of motion. Neck supple.   Cardiovascular: Normal rate, regular rhythm, normal heart sounds and intact distal pulses.  Exam reveals no friction rub.    No murmur heard.  Pulmonary/Chest: Effort normal and breath sounds normal. No respiratory distress. He has no wheezes. He has no rales.   Abdominal:  Soft. Bowel sounds are normal. He exhibits no distension. There is no tenderness. There is no rebound and no guarding.   Musculoskeletal: Normal range of motion. He exhibits edema. He exhibits no tenderness.   Woody edema of bilateral lower extremities   Neurological: He is alert and oriented to person, place, and time. Coordination normal.   Skin: Skin is warm and dry. He is not diaphoretic. No pallor.   Psychiatric: He has a normal mood and affect. His behavior is normal.        MDM     Differential Diagnosis; Clinical Impression; Plan:     scc- ? patient appears to have drug seeking behavior asking immediately how much medication he will get and can he get phenergan and benadryl with it    Anasarca- pt with woody edema and elevated creatinine was 3. Now back up to 7. Admit for dialysis  Amount and/or Complexity of Data Reviewed:   Clinical lab tests:  Ordered and reviewed  Tests in the radiology section of CPT??:  Ordered and reviewed   Discuss the patient with another provider:  Yes (hospitalist)   Independant visualization of image, tracing, or specimen:  Yes (ekg)  Progress:   Patient progress:  Stable      Procedures    EKG interpretation: (Preliminary)  Rhythm: normal sinus rhythm; and regular . Rate (approx.): 90; Axis: normal; P wave: normal; QRS interval: normal ; ST/T wave: non-specific changes    8:02 PM  Pt with esrd and creatinine was 3. Back up to 7 with woody edema. Admit for dialysis

## 2013-09-05 LAB — CBC WITH AUTOMATED DIFF
ABS. BASOPHILS: 0 10*3/uL (ref 0.0–0.1)
ABS. EOSINOPHILS: 0.2 10*3/uL (ref 0.0–0.4)
ABS. LYMPHOCYTES: 0.7 10*3/uL — ABNORMAL LOW (ref 0.8–3.5)
ABS. MONOCYTES: 0.3 10*3/uL (ref 0.0–1.0)
ABS. NEUTROPHILS: 2.9 10*3/uL (ref 1.8–8.0)
BASOPHILS: 0 % (ref 0–1)
EOSINOPHILS: 5 % (ref 0–7)
HCT: 27.1 % — ABNORMAL LOW (ref 36.6–50.3)
HGB: 8.7 g/dL — ABNORMAL LOW (ref 12.1–17.0)
LYMPHOCYTES: 17 % (ref 12–49)
MCH: 27.6 PG (ref 26.0–34.0)
MCHC: 32.1 g/dL (ref 30.0–36.5)
MCV: 86 FL (ref 80.0–99.0)
MONOCYTES: 8 % (ref 5–13)
NEUTROPHILS: 70 % (ref 32–75)
PLATELET: 118 10*3/uL — ABNORMAL LOW (ref 150–400)
RBC: 3.15 M/uL — ABNORMAL LOW (ref 4.10–5.70)
RDW: 16.6 % — ABNORMAL HIGH (ref 11.5–14.5)
WBC: 4.1 10*3/uL (ref 4.1–11.1)

## 2013-09-05 LAB — EKG, 12 LEAD, INITIAL
Atrial Rate: 90 {beats}/min
Calculated P Axis: 41 degrees
Calculated R Axis: 33 degrees
Calculated T Axis: 39 degrees
Diagnosis: NORMAL
P-R Interval: 182 ms
Q-T Interval: 418 ms
QRS Duration: 82 ms
QTC Calculation (Bezet): 511 ms
Ventricular Rate: 90 {beats}/min

## 2013-09-05 LAB — CBC W/O DIFF
HCT: 26 % — ABNORMAL LOW (ref 36.6–50.3)
HGB: 8.1 g/dL — ABNORMAL LOW (ref 12.1–17.0)
MCH: 27.3 PG (ref 26.0–34.0)
MCHC: 31.2 g/dL (ref 30.0–36.5)
MCV: 87.5 FL (ref 80.0–99.0)
PLATELET: 123 10*3/uL — ABNORMAL LOW (ref 150–400)
RBC: 2.97 M/uL — ABNORMAL LOW (ref 4.10–5.70)
RDW: 16.8 % — ABNORMAL HIGH (ref 11.5–14.5)
WBC: 3.5 10*3/uL — ABNORMAL LOW (ref 4.1–11.1)

## 2013-09-05 LAB — METABOLIC PANEL, BASIC
Anion gap: 10 mmol/L (ref 5–15)
BUN/Creatinine ratio: 8 — ABNORMAL LOW (ref 12–20)
BUN: 50 MG/DL — ABNORMAL HIGH (ref 6–20)
CO2: 25 mmol/L (ref 21–32)
Calcium: 7.6 MG/DL — ABNORMAL LOW (ref 8.5–10.1)
Chloride: 102 mmol/L (ref 97–108)
Creatinine: 6.62 MG/DL — ABNORMAL HIGH (ref 0.45–1.15)
GFR est AA: 12 mL/min/{1.73_m2} — ABNORMAL LOW (ref >60)
GFR est non-AA: 10 mL/min/{1.73_m2} — ABNORMAL LOW (ref >60)
Glucose: 90 mg/dL (ref 65–100)
Potassium: 4.6 mmol/L (ref 3.5–5.1)
Sodium: 137 mmol/L (ref 136–145)

## 2013-09-05 LAB — PTH INTACT
Calcium: 7.9 MG/DL — ABNORMAL LOW (ref 8.5–10.1)
PTH, Intact: 1679.1 pg/mL — ABNORMAL HIGH (ref 14.0–72.0)

## 2013-09-05 LAB — RETICULOCYTE COUNT: Reticulocyte count: 0.5 % — ABNORMAL LOW (ref 0.7–2.1)

## 2013-09-05 LAB — IRON PROFILE
Iron % saturation: 40 % (ref 20–50)
Iron: 55 ug/dL (ref 35–150)
TIBC: 136 ug/dL — ABNORMAL LOW (ref 250–450)

## 2013-09-05 LAB — HEMOGLOBIN A1C WITH EAG: Hemoglobin A1c: 4.7 % (ref 4.2–6.3)

## 2013-09-05 MED ADMIN — diphenhydrAMINE (BENADRYL) injection 25 mg: INTRAVENOUS | @ 22:00:00 | NDC 63323066401

## 2013-09-05 MED ADMIN — diphenhydrAMINE (BENADRYL) injection 25 mg: INTRAVENOUS | @ 03:00:00 | NDC 63323066401

## 2013-09-05 MED ADMIN — hydromorphone (DILAUDID) injection 4 mg: INTRAVENOUS | @ 18:00:00 | NDC 00641012121

## 2013-09-05 MED ADMIN — heparin (porcine) injection 5,000 Units: SUBCUTANEOUS | @ 19:00:00 | NDC 00409272301

## 2013-09-05 MED ADMIN — ondansetron (ZOFRAN) injection 4 mg: INTRAVENOUS | @ 22:00:00 | NDC 23155037831

## 2013-09-05 MED ADMIN — amlodipine (NORVASC) tablet 5 mg: ORAL | @ 13:00:00 | NDC 59762153002

## 2013-09-05 MED ADMIN — heparin (porcine) injection 5,000 Units: SUBCUTANEOUS | @ 03:00:00 | NDC 00409272301

## 2013-09-05 MED ADMIN — diphenhydrAMINE (BENADRYL) injection 25 mg: INTRAVENOUS | @ 07:00:00 | NDC 63323066401

## 2013-09-05 MED ADMIN — calcium acetate (PHOSLO) capsule 1,334 mg: ORAL | @ 22:00:00 | NDC 00054008826

## 2013-09-05 MED ADMIN — lisinopril (PRINIVIL, ZESTRIL) tablet 20 mg: ORAL | @ 13:00:00 | NDC 00185010210

## 2013-09-05 MED ADMIN — sodium chloride (NS) flush 5-10 mL: INTRAVENOUS | @ 03:00:00 | NDC 87701099893

## 2013-09-05 MED ADMIN — calcium acetate (PHOSLO) capsule 1,334 mg: ORAL | @ 17:00:00 | NDC 00054008826

## 2013-09-05 MED ADMIN — folic acid (FOLVITE) tablet 1 mg: ORAL | @ 13:00:00 | NDC 00603316232

## 2013-09-05 MED ADMIN — epoetin alfa (EPOGEN;PROCRIT) injection 20,000 Units: SUBCUTANEOUS | NDC 59676032000

## 2013-09-05 MED ADMIN — diphenhydrAMINE (BENADRYL) injection 25 mg: INTRAVENOUS | @ 14:00:00 | NDC 63323066401

## 2013-09-05 MED ADMIN — hydroxyurea (HYDREA) chemo cap 500 mg: ORAL | @ 13:00:00 | NDC 49884072401

## 2013-09-05 MED ADMIN — hydromorphone (DILAUDID) injection 4 mg: INTRAVENOUS | @ 11:00:00 | NDC 00641012121

## 2013-09-05 MED ADMIN — hydromorphone (DILAUDID) injection 4 mg: INTRAVENOUS | @ 14:00:00 | NDC 00641012121

## 2013-09-05 MED ADMIN — diphenhydrAMINE (BENADRYL) injection 25 mg: INTRAVENOUS | @ 11:00:00 | NDC 63323066401

## 2013-09-05 MED ADMIN — heparin (porcine) injection 5,000 Units: SUBCUTANEOUS | @ 09:00:00 | NDC 00409272301

## 2013-09-05 MED ADMIN — hydromorphone (DILAUDID) injection 4 mg: INTRAVENOUS | @ 22:00:00 | NDC 00641012121

## 2013-09-05 MED ADMIN — sodium chloride (NS) flush 5-10 mL: INTRAVENOUS | @ 19:00:00 | NDC 87701099893

## 2013-09-05 MED ADMIN — hydromorphone (PF) (DILAUDID) injection 4 mg: INTRAVENOUS | @ 07:00:00 | NDC 00409255201

## 2013-09-05 MED ADMIN — sodium chloride (NS) flush 5-10 mL: INTRAVENOUS | @ 09:00:00 | NDC 87701099893

## 2013-09-05 MED ADMIN — hydromorphone (PF) (DILAUDID) injection 1 mg: INTRAVENOUS | @ 01:00:00 | NDC 00409255201

## 2013-09-05 MED ADMIN — hydromorphone (PF) (DILAUDID) injection 4 mg: INTRAVENOUS | @ 03:00:00 | NDC 00409255201

## 2013-09-05 MED ADMIN — B complex-vitaminC-folic acid (NEPHROCAP) cap: ORAL | @ 13:00:00 | NDC 70199002611

## 2013-09-05 MED ADMIN — calcium acetate (PHOSLO) capsule 1,334 mg: ORAL | @ 13:00:00 | NDC 00054008826

## 2013-09-05 MED ADMIN — ondansetron (ZOFRAN) injection 4 mg: INTRAVENOUS | @ 14:00:00 | NDC 23155037831

## 2013-09-05 MED ADMIN — ondansetron (ZOFRAN) injection 4 mg: INTRAVENOUS | @ 18:00:00 | NDC 23155037831

## 2013-09-05 MED ADMIN — diphenhydrAMINE (BENADRYL) injection 25 mg: INTRAVENOUS | @ 18:00:00 | NDC 63323066401

## 2013-09-05 NOTE — Progress Notes (Signed)
Bedside and Verbal shift change report given to Amber, RN (oncoming nurse) by Ashton, RN (offgoing nurse). Report included the following information SBAR and Kardex.

## 2013-09-05 NOTE — Progress Notes (Signed)
Patient is alert and oriented x4, refusing to have full skin assessment completed. This RN explained the importance of a full assessment. Patient allowed BLE's to be assessed. See flowsheet. Will continue to monitor.

## 2013-09-05 NOTE — Consults (Signed)
Consult dictated    ESRD, poorly compliant. Clearly unstable social situation and not getting regular nephrology care. Looks like he gets dialysis periodically when admitted to various hospitals but does not have outpatient dialysis.    I will arrange for dialysis today. We will try to get him outpatient dialysis arranged. The biggest challenge will be getting him to go to dialysis as prescribed, although he assures me that he is going to go to dialysis.

## 2013-09-05 NOTE — Consults (Signed)
Name:       QUILL, GRINDER   Admitted:          09/04/2013                                         DOB:               13-Jul-1981  Account #:  [id number]               Age:               32  Consultant: Latricia Heft, MD          Location                                CONSULTATION REPORT    DATE OF CONSULTATION           09/04/2013      REQUESTING PHYSICIAN: Dr. Mylo Red.    CHIEF COMPLAINT: End-stage renal disease.    HISTORY OF PRESENT ILLNESS: The patient is a 31 year old African American  male who has reported history of sickle cell disease and ESRD. He has been  seen by me in the past. He has a right upper arm AV fistula. His history is  a bit vague, but reportedly he was on dialysis in [place] until  relocating last year. As far as I can tell, over the past year or so, he  has received dialysis just intermittently when hospitalized, but has not  attended a regular clinic. One of his concerns that he reports is that he  had no insurance, but he now apparently has Medicare. He has come into the  hospital again with a number of issues including pain presumably due to  sickle cell disease, which is being treated with intravenous narcotics. He  has dyspnea and progressive edema and anasarca. He has had nausea and  vomiting which has been treated with p.r.n. antiemetics. He has  hypertension and is on a regimen including an ACE inhibitor. We are asked  to provide management of his end-stage renal disease. His serum creatinine  is 6.62 today. He tells me he was last dialyzed about 6 months ago,  although there is some documentation in the system of a hospitalization in  the interim, so I do not know that the history is completely accurate.    REVIEW OF SYSTEMS  CONSTITUTIONAL: Positive for weakness. No fevers.  GASTROINTESTINAL: Positive for nausea, vomiting, diarrhea.  GENITOURINARY: No hematuria or difficulty voiding, still making urine.  CARDIOVASCULAR: Positive for chest pain, positive for  shortness of breath,  positive for edema.  MUSCULOSKELETAL: Positive for bone pain in multiple locations. Positive for  chronic hip pain. All other systems reviewed and are negative except as per  history of present illness.    PAST MEDICAL HISTORY: End-stage renal disease, hypertension, sickle cell  anemia.    FAMILY HISTORY: No family history of renal disease. Positive for sickle  cell home.    SOCIAL HISTORY: He is a smoker.    PHYSICAL EXAMINATION  VITAL SIGNS: Temperature 97.8, pulse 79, blood pressure 183/87.  GENERAL: Obese African American male, no acute distress.  EYES: Anicteric. Pupils round and reactive. ENMT: Mucous membranes are  moist. There is no epistaxis.  NECK: Nontender with no masses. Thyroid is not enlarged.  CARDIOVASCULAR: Heart is regular S1, S2. There is 3+ to 4+ lower extremity  edema.  RESPIRATORY: Lungs are fairly clear with fair effort.  GI: Abdomen is obese and nontender. Bowel sounds are active.  Hepatosplenomegaly is not appreciated.  SKIN: Warm with normal turgor. There is no rash.  MUSCULOSKELETAL: There is no cyanosis or clubbing. There is no synovitis of  fingers or wrists bilaterally.    LABORATORY DATA: Sodium 137, potassium 4.6, chloride 102, bicarbonate 25,  BUN 50, creatinine 6.6. White count 3.5, hemoglobin 8.1, platelets 123.    RADIOGRAPHIC STUDIES: Chest x-ray shows pulmonary edema.    ASSESSMENT: End-stage renal disease with volume overload and uremia  secondary to under dialysis. This is a patient who clearly has an unstable  social situation and has not had regular Nephrology care or regular  dialysis over the past year. As far as I can tell, he is only getting  dialysis intermittently when hospitalized. He certainly needs dialysis at  this point, but more importantly needs chronic outpatient dialysis in order  to optimize his health.    PLANS:  1. Dialysis today by AV fistula. We will run for 4 hours with a 4 kilo UF  goal. Probably do dialysis again tomorrow as  well.  2. Check iron and begin EPO.  3. Check PTH and probably will add Vitamin D analogs.  4. Continue current antihypertensives and monitor response to  ultrafiltration with dialysis.  5. I spoke with the patient about his situation and need for long-term  dialysis. He does tell me that he clearly understands that he needs to have  regular dialysis and that he is willing to attend dialysis sessions. We  will attempt to find placement for him and hopefully he will follow  through. Obviously, I would consider him to be at high risk of  noncompliance and less than optimal outcomes.    Thank you for allowing me to participate in the care of this patient.  Please do not hesitate to contact me if you have any questions.                Latricia Heft, MD    cc:                       Latricia Heft, MD      DGG/wmx; D: 09/05/2013 11:13 A; T: 09/05/2013 11:44 A; DOC# [id number]; Job#  [id number]

## 2013-09-05 NOTE — Progress Notes (Signed)
Patient stating he "needs his dilaudid to be pushed fast and it should not be diluted". Nurse informed patient that dilaudid is to be administered slowly and it is to be diluted with saline. Patient informed nurse she was not "doing it like the last nurse gave it".  Patient also states when he "gets his dilaudid he should ALWAYS have his zofran and benydryl.and that the benydryl was not strong enough".

## 2013-09-05 NOTE — Other (Signed)
Bedside and Verbal shift change report given to Barb RN (oncoming nurse) by Amber RN (offgoing nurse). Report included the following information SBAR, Kardex, Intake/Output, MAR, Accordion, Recent Results and Med Rec Status.

## 2013-09-05 NOTE — Progress Notes (Signed)
[facility] [place] [facility]  [address] [place], [place] [address]  [phone]      Medical Progress Note      NAME: Tyler Ballard   DOB:  1981-07-25  MRM:  [id number]    Date/Time: 09/05/2013  8:41 AM       Assessment and Plan:     Sickle cell anemia with pain / Sickle cell crisis / Arthralgia - Continue dilaudid prn.  I note that the patient has no PCP.  He states he only gets baseline narcotics by going hospital to hospital, with Rx at discharge.  I also note that the blood smear reports in our system make no mention of sickle cells, and his haptoglobin is normal.  I will check a Hb analysis.  Continue folic acid and hydroxyurea    ESRD (end stage renal disease) on dialysis - Awaiting input from renal.  May need to resume dialysis. Continue nephrocap and phoslo    HTN (hypertension) - Continue norvasc and lisinopril    Persistent vomiting / Nausea & vomiting - Prn zofran.  IVF.  PPI.  Unclear etiology.    Pancytopenia - Mild.  Monitor.       Subjective:     Chief Complaint:  Pain is persistent, states he vomited this AM.    ROS:  (bold if positive, if negative)    Abd PainNausea/Vomit  Tolerating PT   Tolerating some Diet        Objective:     Last 24hrs VS reviewed since prior progress note. Most recent are:    Visit Vitals   Item Reading   ??? BP 175/84   ??? Pulse 75   ??? Temp 97.8 ??F (36.6 ??C)   ??? Resp 18   ??? Ht 5\' 5"  (1.651 m)   ??? Wt 110.224 kg (243 lb)   ??? BMI 40.44 kg/m2   ??? SpO2 100%     SpO2 Readings from Last 6 Encounters:   09/05/13 100%   07/17/13 100%   07/12/13 92%   05/26/13 95%   04/25/13 97%   03/29/13 98%    O2 Flow Rate (L/min): 2 l/min     Intake/Output Summary (Last 24 hours) at 09/05/13 0841  Last data filed at 09/05/13 0205   Gross per 24 hour   Intake    150 ml   Output      0 ml   Net    150 ml        Physical Exam:    Gen:  Obese, disheveled, in no acute distress  HEENT:  Pink conjunctivae, PERRL, hearing intact to voice, moist mucous membranes  Neck:  Supple,  without masses, thyroid non-tender  Resp:  No accessory muscle use, clear breath sounds without wheezes rales or rhonchi  Card:  No murmurs, normal S1, S2 without thrills, bruits or peripheral edema  Abd:  Soft, non-tender, non-distended, normoactive bowel sounds are present, no mass  Lymph:  No cervical or inguinal adenopathy  Musc:  No cyanosis or clubbing  Skin:  No rashes or ulcers, skin turgor is good  Neuro:  Cranial nerves are grossly intact, no focal motor weakness, follows commands appropriately  Psych:  Good insight, oriented to person, place and time, alert    Telemetry reviewed:   normal sinus rhythm  __________________________________________________________________  Medications Reviewed: (see below)  Medications:     Current Facility-Administered Medications   Medication Dose Route Frequency   ??? hydromorphone (DILAUDID) injection 4  mg  4 mg IntraVENous Q4H PRN   ??? [COMPLETED] hydromorphone (PF) (DILAUDID) injection 1 mg  1 mg IntraVENous NOW   ??? [COMPLETED] ondansetron (ZOFRAN) injection 4 mg  4 mg IntraVENous NOW   ??? [COMPLETED] diphenhydrAMINE (BENADRYL) 25 mg in dextrose 5% 50 mL IVPB  25 mg IntraVENous ONCE   ??? [COMPLETED] hydromorphone (PF) (DILAUDID) injection 1 mg  1 mg IntraVENous NOW   ??? amlodipine (NORVASC) tablet 5 mg  5 mg Oral DAILY   ??? B complex-vitaminC-folic acid (NEPHROCAP) cap  1 capsule Oral DAILY   ??? calcium acetate (PHOSLO) capsule 1,334 mg  2 capsule Oral TID WITH MEALS   ??? folic acid (FOLVITE) tablet 1 mg  1 mg Oral DAILY   ??? hydroxyurea (HYDREA) chemo cap 500 mg  500 mg Oral DAILY   ??? lisinopril (PRINIVIL, ZESTRIL) tablet 20 mg  20 mg Oral DAILY   ??? ondansetron (ZOFRAN ODT) tablet 8 mg  8 mg Oral Q8H PRN   ??? promethazine (PHENERGAN) tablet 25 mg  25 mg Oral Q4H PRN   ??? sodium chloride (NS) flush 5-10 mL  5-10 mL IntraVENous Q8H   ??? sodium chloride (NS) flush 5-10 mL  5-10 mL IntraVENous PRN   ??? acetaminophen (TYLENOL) tablet 650 mg  650 mg Oral Q4H PRN   ??? ondansetron (ZOFRAN)  injection 4 mg  4 mg IntraVENous Q4H PRN   ??? heparin (porcine) injection 5,000 Units  5,000 Units SubCUTAneous Q8H   ??? diphenhydrAMINE (BENADRYL) injection 25 mg  25 mg IntraVENous Q4H PRN   ??? hydromorphone (DILAUDID) injection 2 mg  2 mg IntraVENous Q4H PRN        Lab Data Reviewed: (see below)  Lab Review:     Recent Labs      09/05/13   0437  09/04/13   1930   WBC  3.5*  4.1   HGB  8.1*  8.7*   HCT  26.0*  27.1*   PLT  123*  118*     Recent Labs      09/05/13   0437   NA  137   K  4.6   CL  102   CO2  25   GLU  90   BUN  50*   CREA  6.62*   CA  7.6*     Lab Results   Component Value Date/Time    Glucose (POC) 84 09/04/2013  7:35 PM    Glucose (POC) 94 07/10/2012 12:36 PM    Glucose (POC) 119 07/10/2012  6:57 AM    Glucose (POC) 129 07/09/2012  7:34 PM    Glucose (POC) 145 07/09/2012  4:43 PM    Glucose (POC) 123 07/09/2012 12:28 PM       Other pertinent lab: none    Total time spent with patient: 24 Minutes                  Care Plan discussed with: Patient, Nursing Staff and >50% of time spent in counseling and coordination of care    Discussed:  Care Plan    Prophylaxis:  H2B/PPI    Disposition:  Home w/Family           ___________________________________________________    Attending Physician: Salvadore Dom, MD

## 2013-09-05 NOTE — Progress Notes (Signed)
Problem: Falls - Risk of  Goal: *Absence of falls  Outcome: Progressing Towards Goal  Patient instructed to use call bell for any assistance needed. Call bell and belongings left within patient's reach. Patient verbalized understanding. No complaints at this time, will continue to monitor.

## 2013-09-05 NOTE — Other (Signed)
[facility]                      [place]                         [phone]  Vitals Pre Post Assessment Pre Post   BP 145/94 153/78 LOC ALERT AND ORIENTED X 3 ALERT AND ORIENTED X 3   HR 76 80 Lungs FINE CRACKLES AND DIMINISHED BILATERAL BASES DIMINISHED BILATERAL BASES   Temp 97.9 98.3 Cardiac REG/IRREG RATE AND RHYTHM REG/IRREG RATE AND RHYTHM   Resp 20 20 Skin WARM AND DRY WARM AND DRY   Weight 111.8 kg 106.7 kg Edema GENERALIZED; 4 + BILATERAL LOWER LEGS GENERALIZED; 4+ BILATERAL LOWER LEGS      Pain PATIENT DENIES PAIN. RATED 0/10 PATIENT DENIES PAIN. RATED 0/10.     Orders   Duration: Start: 1600 End: 2000 Total: 4:00   Dialyzer: REVACLEAR -[id number]    TUBING [id number]   K Bath: 2   Ca Bath: 2   Na / Bicarb: 140   Target Fluid Removal: 40     Access   Type & Location: RIGHT UPPER ARM AV FISTULA   Comments:  POSITIVE BRUIT AND POSITIVE THRILL PRETREATMENT. CANNULATED WITH 15 GAUGE NEEDLES X 2 WITHOUT DIFFICULTY. ACCESS AND LINES SECURED THROUGHOUT THE TREATMENT.     Labs   Hep B status / date: [facility]   Obtained/Reviewed  Critical Results Called   NO CRITICAL RESULTS NOTED     Meds Given   Name Dose Route   PROCRIT 20,000 UNITS IVP               Total Liters Process: 90.8 LITERS   Net Fluid Removed: 5000 ML      Comments   Time Out Done: 1530   Primary Nurse Rpt Pre: Helmut Muster, RN   Primary Nurse Rpt Post: Helmut Muster, RN   Pt Education: EDUCATED PATIENT ON FLUID MANAGEMENT. PATIENT VERBALLY ACKNOWLEDGED UNDERSTANDING.   Tx Summary: CONSENT OBTAINED AND ORDERS REVIEWED.PATIENT TOLERATED THE TREATMENT WELL. ALL POSSIBLE BLOOD RETURNED POSTTREATMENT. NEEDLES REMOVED. NO EXCESSIVE BLEEDING NOTED (A=10MINS;V=10 MINS). POSITIVE BRUIT AND POSITIVE THRILL POSTTREATMENT. PATIENT LEFT IN LOWEST POSITION IN BED, CALL BELL WITHIN REACH AND SIDE RAILS UP X 3.  M.BYNUM, RN

## 2013-09-05 NOTE — Progress Notes (Signed)
Nutrition - Rn Referral     Diet: Diabetic  Body mass index is 40.44 kg/(m^2).  Braden Score: 21     Skin Assessment: wnl    Consult received via Rn adm screening for hx of wt loss.  Pt obese. Pt denies hx of wt loss, reports good po intake pta.  States he follow a low Na diet for hx of ESRD.  He currently is not receiving HD.  Denies hx of Dm.  Spoke w/ MD who will change diet to Renal. Currently states he is not feeling well and did not eat much at breakfast.  Offered snacks until appetite improves.     RD Plan.  Snacks bid   RD will f/u as appropriate for further nutrition intervention and assessment.       PAMELA CAMPBELL, RD

## 2013-09-05 NOTE — Progress Notes (Signed)
09-05-2013 CARE MANAGEMENT NOTE:  I met with the pt to discuss the need to set up a local dialysis unit for the pt. When I tried to discuss discharge plans with him, he would not give me an address or discuss where he will be going or living with. He stated he wants the closest unit to the hospital that Dr. Sherrine Maples goes to and also stated that he will be returning to [facility] after Christmas. I faxed the dialysis order, Hepatitis profile, Admission H&P, Dr. Gordy Councilman note dated 09-05-2013, and his latest chest X-ray to [facility] ATTN. [place] [phone], [phone] with a note that he is currently receiving his first dialysis here currently. I will continue to follow and send additional notes as needed/requested. I was told that the only chair they would have available would be for Tues.,Thurs.,Sat. (no time given yet) Raliegh Scarlet, BSW, CM

## 2013-09-06 LAB — CBC WITH AUTOMATED DIFF
ABS. BASOPHILS: 0 10*3/uL (ref 0.0–0.1)
ABS. EOSINOPHILS: 0.2 10*3/uL (ref 0.0–0.4)
ABS. LYMPHOCYTES: 0.6 10*3/uL — ABNORMAL LOW (ref 0.8–3.5)
ABS. MONOCYTES: 0.4 10*3/uL (ref 0.0–1.0)
ABS. NEUTROPHILS: 2 10*3/uL (ref 1.8–8.0)
BASOPHILS: 0 % (ref 0–1)
EOSINOPHILS: 6 % (ref 0–7)
HCT: 27.6 % — ABNORMAL LOW (ref 36.6–50.3)
HGB: 8.7 g/dL — ABNORMAL LOW (ref 12.1–17.0)
LYMPHOCYTES: 18 % (ref 12–49)
MCH: 28.2 PG (ref 26.0–34.0)
MCHC: 31.5 g/dL (ref 30.0–36.5)
MCV: 89.3 FL (ref 80.0–99.0)
MONOCYTES: 11 % (ref 5–13)
NEUTROPHILS: 65 % (ref 32–75)
PLATELET: 140 10*3/uL — ABNORMAL LOW (ref 150–400)
RBC: 3.09 M/uL — ABNORMAL LOW (ref 4.10–5.70)
RDW: 16.3 % — ABNORMAL HIGH (ref 11.5–14.5)
WBC: 3.2 10*3/uL — ABNORMAL LOW (ref 4.1–11.1)

## 2013-09-06 LAB — METABOLIC PANEL, COMPREHENSIVE
A-G Ratio: 0.3 — ABNORMAL LOW (ref 1.1–2.2)
ALT (SGPT): 14 U/L (ref 12–78)
AST (SGOT): 19 U/L (ref 15–37)
Albumin: 2.3 g/dL — ABNORMAL LOW (ref 3.5–5.0)
Alk. phosphatase: 94 U/L (ref 45–117)
Anion gap: 10 mmol/L (ref 5–15)
BUN/Creatinine ratio: 6 — ABNORMAL LOW (ref 12–20)
BUN: 31 MG/DL — ABNORMAL HIGH (ref 6–20)
Bilirubin, total: 0.4 MG/DL (ref 0.2–1.0)
CO2: 30 mmol/L (ref 21–32)
Calcium: 8 MG/DL — ABNORMAL LOW (ref 8.5–10.1)
Chloride: 99 mmol/L (ref 97–108)
Creatinine: 5.14 MG/DL — ABNORMAL HIGH (ref 0.45–1.15)
GFR est AA: 16 mL/min/{1.73_m2} — ABNORMAL LOW (ref >60)
GFR est non-AA: 13 mL/min/{1.73_m2} — ABNORMAL LOW (ref >60)
Globulin: 7.1 g/dL — ABNORMAL HIGH (ref 2.0–4.0)
Glucose: 81 mg/dL (ref 65–100)
Potassium: 4.2 mmol/L (ref 3.5–5.1)
Protein, total: 9.4 g/dL — ABNORMAL HIGH (ref 6.4–8.2)
Sodium: 139 mmol/L (ref 136–145)

## 2013-09-06 LAB — METABOLIC PANEL, BASIC
Anion gap: 6 mmol/L (ref 5–15)
Anion gap: 9 mmol/L (ref 5–15)
BUN/Creatinine ratio: 6 — ABNORMAL LOW (ref 12–20)
BUN/Creatinine ratio: 5 — ABNORMAL LOW (ref 12–20)
BUN: 29 MG/DL — ABNORMAL HIGH (ref 6–20)
BUN: 30 MG/DL — ABNORMAL HIGH (ref 6–20)
CO2: 32 mmol/L (ref 21–32)
CO2: 31 mmol/L (ref 21–32)
Calcium: 7.8 MG/DL — ABNORMAL LOW (ref 8.5–10.1)
Calcium: 8.3 MG/DL — ABNORMAL LOW (ref 8.5–10.1)
Chloride: 101 mmol/L (ref 97–108)
Chloride: 101 mmol/L (ref 97–108)
Creatinine: 4.84 MG/DL — ABNORMAL HIGH (ref 0.45–1.15)
Creatinine: 5.47 MG/DL — ABNORMAL HIGH (ref 0.45–1.15)
GFR est AA: 17 mL/min/{1.73_m2} — ABNORMAL LOW (ref >60)
GFR est AA: 15 mL/min/{1.73_m2} — ABNORMAL LOW (ref >60)
GFR est non-AA: 14 mL/min/{1.73_m2} — ABNORMAL LOW (ref >60)
GFR est non-AA: 12 mL/min/{1.73_m2} — ABNORMAL LOW (ref >60)
Glucose: 87 mg/dL (ref 65–100)
Glucose: 108 mg/dL — ABNORMAL HIGH (ref 65–100)
Potassium: 4 mmol/L (ref 3.5–5.1)
Potassium: 4.3 mmol/L (ref 3.5–5.1)
Sodium: 139 mmol/L (ref 136–145)
Sodium: 141 mmol/L (ref 136–145)

## 2013-09-06 LAB — LACTIC ACID: Lactic acid: 0.6 MMOL/L (ref 0.4–2.0)

## 2013-09-06 LAB — CBC W/O DIFF
HCT: 26.4 % — ABNORMAL LOW (ref 36.6–50.3)
HGB: 8.2 g/dL — ABNORMAL LOW (ref 12.1–17.0)
MCH: 27.4 PG (ref 26.0–34.0)
MCHC: 31.1 g/dL (ref 30.0–36.5)
MCV: 88.3 FL (ref 80.0–99.0)
PLATELET: 111 10*3/uL — ABNORMAL LOW (ref 150–400)
RBC: 2.99 M/uL — ABNORMAL LOW (ref 4.10–5.70)
RDW: 16.7 % — ABNORMAL HIGH (ref 11.5–14.5)
WBC: 2.5 10*3/uL — ABNORMAL LOW (ref 4.1–11.1)

## 2013-09-06 LAB — MAGNESIUM
Magnesium: 2 mg/dL (ref 1.6–2.4)
Magnesium: 2 mg/dL (ref 1.6–2.4)
Magnesium: 1.9 mg/dL (ref 1.6–2.4)

## 2013-09-06 LAB — CK-MB,QUANT.: CK - MB: <0.5 NG/ML — ABNORMAL LOW (ref 0.5–3.6)

## 2013-09-06 LAB — NT-PRO BNP: NT pro-BNP: 14911 PG/ML — ABNORMAL HIGH (ref 0–125)

## 2013-09-06 LAB — TROPONIN I
Troponin-I, Qt.: <0.04 ng/mL (ref <0.05)
Troponin-I, Qt.: <0.04 ng/mL (ref <0.05)

## 2013-09-06 LAB — CK W/ CKMB & INDEX
CK - MB: <0.5 NG/ML — ABNORMAL LOW (ref 0.5–3.6)
CK: 61 U/L (ref 39–308)

## 2013-09-06 LAB — CK: CK: 58 U/L (ref 39–308)

## 2013-09-06 LAB — PHOSPHORUS: Phosphorus: 3.9 MG/DL (ref 2.5–4.9)

## 2013-09-06 LAB — PERIPHERAL SMEAR

## 2013-09-06 LAB — SICKLE CELL SCREEN: Sickle cell screen: NEGATIVE

## 2013-09-06 MED ADMIN — ondansetron (ZOFRAN) injection 4 mg: INTRAVENOUS | @ 15:00:00 | NDC 23155037831

## 2013-09-06 MED ADMIN — diphenhydrAMINE (BENADRYL) injection 25 mg: INTRAVENOUS | @ 06:00:00 | NDC 00641037621

## 2013-09-06 MED ADMIN — diphenhydrAMINE (BENADRYL) injection 25 mg: INTRAVENOUS | @ 02:00:00 | NDC 00641037621

## 2013-09-06 MED ADMIN — sodium chloride (NS) flush 5-10 mL: INTRAVENOUS | @ 11:00:00 | NDC 87701099893

## 2013-09-06 MED ADMIN — hydromorphone (DILAUDID) injection 2 mg: INTRAVENOUS | @ 02:00:00 | NDC 00641012121

## 2013-09-06 MED ADMIN — heparin (porcine) injection 5,000 Units: SUBCUTANEOUS | @ 02:00:00 | NDC 00409272301

## 2013-09-06 MED ADMIN — diphenhydrAMINE (BENADRYL) injection 25 mg: INTRAVENOUS | @ 10:00:00 | NDC 63323066401

## 2013-09-06 MED ADMIN — heparin (porcine) injection 5,000 Units: SUBCUTANEOUS | @ 10:00:00 | NDC 00409272301

## 2013-09-06 MED ADMIN — hydromorphone (DILAUDID) injection 4 mg: INTRAVENOUS | @ 10:00:00 | NDC 00641012121

## 2013-09-06 MED ADMIN — ondansetron (ZOFRAN) injection 4 mg: INTRAVENOUS | @ 02:00:00 | NDC 23155037831

## 2013-09-06 MED ADMIN — diphenhydrAMINE (BENADRYL) injection 25 mg: INTRAVENOUS | @ 17:00:00 | NDC 63323066401

## 2013-09-06 MED ADMIN — promethazine (PHENERGAN) injection 12.5 mg: INTRAVENOUS | @ 17:00:00 | NDC 00641092821

## 2013-09-06 MED ADMIN — hydromorphone (PF) (DILAUDID) injection 1 mg: INTRAVENOUS | @ 15:00:00 | NDC 00409128331

## 2013-09-06 MED ADMIN — hydromorphone (DILAUDID) injection 4 mg: INTRAVENOUS | @ 06:00:00 | NDC 00641012121

## 2013-09-06 MED ADMIN — bumetanide (BUMEX) injection 4 mg: INTRAVENOUS | @ 17:00:00 | NDC 00641600801

## 2013-09-06 MED ADMIN — diphenhydrAMINE (BENADRYL) injection 12.5 mg: INTRAVENOUS | @ 15:00:00 | NDC 00641037621

## 2013-09-06 MED ADMIN — hydromorphone (PF) (DILAUDID) injection 1 mg: INTRAVENOUS | @ 17:00:00 | NDC 00409128331

## 2013-09-06 MED ADMIN — sodium chloride (NS) flush 5-10 mL: INTRAVENOUS | @ 02:00:00 | NDC 82903065462

## 2013-09-06 NOTE — Progress Notes (Signed)
[facility]  [address] [place], [place] [address]  [phone]      Medical Progress Note      NAME: Tyler Ballard   DOB:  10-Aug-1981  MRM:  [id number]    Date/Time: 09/06/2013  7:37 AM       Assessment and Plan:     Sickle cell anemia with pain / Sickle cell crisis / Arthralgia - I doubt this Dx.  I tested a sickle cell screen and it was negative, indicating he has neither sickle cell disease nor sickle cell trait.  I have sent a confirmatory Hb electrophoresis.  He cannot provide the name of any current doctor who has testing to indicate otherwise.  I note that the patient has no PCP.  He states he only gets baseline narcotics by going hospital to hospital, with Rx at discharge.  I also note that the blood smear reports in our system make no mention of sickle cells, and his haptoglobin is normal.   Stop folic acid and hydroxyurea    Anemia of chronic disease - I think his anemia is entirely from ESRD.  EPO per renal    ESRD (end stage renal disease) on dialysis - Appreciate input from renal.  They think that he needs to resume dialysis. However, when I told the patient he will no longer get IV dilaudid, because I do not think he has a sickle cell crisis, he stated he wished to leave AMA prior to dialysis.  I informed him that is life threatening, and he desired to go anyway.  Continue nephrocap and phoslo, for his own safety, despite going AMA.    HTN (hypertension) - Continue norvasc and lisinopril, and I will provide Rx anyway, despite going AMA.    Persistent vomiting / Nausea & vomiting - Stated by patient, although not witnessed.  Prn zofran.  IVF.  PPI.  Unclear etiology.    Pancytopenia - Mild.  Monitor.       Subjective:     Chief Complaint:  Pain is persistent, states he wants to go AMA if dilaudid will not be given IV    ROS:  (bold if positive, if negative)    Abd Pain  Tolerating PT  Tolerating some Diet        Objective:     Last 24hrs VS reviewed since  prior progress note. Most recent are:    Visit Vitals   Item Reading   ??? BP 163/78   ??? Pulse 84   ??? Temp 98.5 ??F (36.9 ??C)   ??? Resp 18   ??? Ht 5\' 5"  (1.651 m)   ??? Wt 110.224 kg (243 lb)   ??? BMI 40.44 kg/m2   ??? SpO2 98%     SpO2 Readings from Last 6 Encounters:   09/06/13 98%   07/17/13 100%   07/12/13 92%   05/26/13 95%   04/25/13 97%   03/29/13 98%    O2 Flow Rate (L/min): 2 l/min   No intake or output data in the 24 hours ending 09/06/13 0737     Physical Exam:    Gen:  Obese, disheveled, in no acute distress  HEENT:  Pink conjunctivae, PERRL, hearing intact to voice, moist mucous membranes  Neck:  Supple, without masses, thyroid non-tender  Resp:  No accessory muscle use, clear breath sounds without wheezes rales or rhonchi  Card:  No murmurs, normal S1, S2 without thrills, bruits or peripheral edema  Abd:  Soft, non-tender, non-distended, normoactive bowel sounds are present, no mass  Lymph:  No cervical or inguinal adenopathy  Musc:  No cyanosis or clubbing  Skin:  No rashes or ulcers, skin turgor is good  Neuro:  Cranial nerves are grossly intact, no focal motor weakness, follows commands appropriately  Psych:  Good insight, oriented to person, place and time, alert    Telemetry reviewed:   normal sinus rhythm  __________________________________________________________________  Medications Reviewed: (see below)  Medications:     Current Facility-Administered Medications   Medication Dose Route Frequency   ??? diphenhydrAMINE (BENADRYL) capsule 50 mg  50 mg Oral Q6H PRN   ??? epoetin alfa (EPOGEN;PROCRIT) injection 20,000 Units  20,000 Units SubCUTAneous Q7D   ??? amlodipine (NORVASC) tablet 5 mg  5 mg Oral DAILY   ??? B complex-vitaminC-folic acid (NEPHROCAP) cap  1 capsule Oral DAILY   ??? calcium acetate (PHOSLO) capsule 1,334 mg  2 capsule Oral TID WITH MEALS   ??? folic acid (FOLVITE) tablet 1 mg  1 mg Oral DAILY   ??? hydroxyurea (HYDREA) chemo cap 500 mg  500 mg Oral DAILY   ??? lisinopril (PRINIVIL, ZESTRIL) tablet 20  mg  20 mg Oral DAILY   ??? ondansetron (ZOFRAN ODT) tablet 8 mg  8 mg Oral Q8H PRN   ??? promethazine (PHENERGAN) tablet 25 mg  25 mg Oral Q4H PRN   ??? sodium chloride (NS) flush 5-10 mL  5-10 mL IntraVENous Q8H   ??? sodium chloride (NS) flush 5-10 mL  5-10 mL IntraVENous PRN   ??? acetaminophen (TYLENOL) tablet 650 mg  650 mg Oral Q4H PRN   ??? heparin (porcine) injection 5,000 Units  5,000 Units SubCUTAneous Q8H        Lab Data Reviewed: (see below)  Lab Review:     Recent Labs      09/06/13   0215  09/05/13   0437  09/04/13   1930   WBC  2.5*  3.5*  4.1   HGB  8.2*  8.1*  8.7*   HCT  26.4*  26.0*  27.1*   PLT  111*  123*  118*     Recent Labs      09/06/13   0215  09/05/13   1155  09/05/13   0437   NA  139   --   137   K  4.0   --   4.6   CL  101   --   102   CO2  32   --   25   GLU  87   --   90   BUN  29*   --   50*   CREA  4.84*   --   6.62*   CA  7.8*  7.9*  7.6*   MG  2.0   --    --    PHOS  3.9   --    --      Lab Results   Component Value Date/Time    Glucose (POC) 84 09/04/2013  7:35 PM    Glucose (POC) 94 07/10/2012 12:36 PM    Glucose (POC) 119 07/10/2012  6:57 AM    Glucose (POC) 129 07/09/2012  7:34 PM    Glucose (POC) 145 07/09/2012  4:43 PM    Glucose (POC) 123 07/09/2012 12:28 PM       Other pertinent lab: none    Total time spent with patient: 45 Minutes  Care Plan discussed with: Patient, Care Manager, Nursing Staff and >50% of time spent in counseling and coordination of care    Discussed:  Care Plan and D/C Planning    Prophylaxis:  H2B/PPI    Disposition:  Home w/Family           ___________________________________________________    Attending Physician: Rulon Abide, MD

## 2013-09-06 NOTE — ED Notes (Signed)
[name] Pa02 sat decreasing to 90% on room air, NP notified and ordered pt to be placed on 2L NC.  Pt placed on cardiac monitor and 2L of oxygen which brought his saturation level up to 100% immediately.  Pt asked why he needed to be on a monitor and I explained that we are concerned that his electrolytes could shift and affect cardiac function and that when he goes upstairs he will be on a telemetry unit.  Pt acknowledged understanding.

## 2013-09-06 NOTE — Discharge Summary (Addendum)
Physician Discharge Summary     Patient ID:  Tyler Ballard  [id number]  32 y.o.  1981-11-14    Admit date: 09/04/2013    Discharge date and time: 09/06/2013    Admission Diagnoses: Sickle cell anemia with pain  Sickle cell crisis    Discharge Diagnoses:    Principal Diagnosis     ESRD (end stage renal disease) on dialysis (07/06/2012)                                            Other Diagnoses    Sickle cell anemia with pain (07/05/2012)    HTN (hypertension) (07/06/2012)    Arthralgia (03/29/2013)    Persistent vomiting (03/29/2013)    Sickle cell crisis (05/26/2013)    Nausea & vomiting (09/04/2013)    Pancytopenia (09/05/2013)    Hospital Course:   Sickle cell anemia with pain / Sickle cell crisis / Arthralgia - I doubt this Dx. I tested a "sickle cell screen" and it was reported negative, a test that indicates he has neither sickle cell disease nor sickle cell trait. I have sent a confirmatory Hb electrophoresis, result is pending. He cannot provide the name of any current doctor who has testing to indicate otherwise. I note that the patient has no PCP. He states he only gets baseline narcotics by going hospital to hospital, with Rx at discharge. I also note that the blood smear reports in our system make no mention of sickle cells, and his haptoglobin and retic count are normal. Stop folic acid and hydroxyurea   Addendum - A manual peripheral smear reported after AMA discharge mentions rare sickle cells or fragments, although I am not sure what mechanical damage would be seen in cells immediately after dialysis, as he had last evening.     Anemia of chronic disease - I think his anemia is entirely from ESRD. EPO per renal     ESRD (end stage renal disease) on dialysis - Appreciate input from renal. They think that he needs to resume dialysis, and he had his first round last night. However, when I told the patient he will no longer get IV dilaudid, because I do not think he has a sickle cell crisis, he stated he  wished to leave AMA prior to today's dialysis. I informed him that is life threatening, and he desired to go anyway. Continue nephrocap and phoslo, for his own safety, despite going AMA.     HTN (hypertension) - Continue norvasc and lisinopril, and I will provide Rx anyway, despite going AMA.     Persistent vomiting / Nausea & vomiting - Stated by patient, although not witnessed. Prn zofran. IVF. PPI. Unclear etiology.     Pancytopenia - Mild. Monitor.     PCP: Phys Other, MD    Consults: Nephrology    Significant Diagnostic Studies: See Hospital Course    Discharge Exam:  ???  BP  163/78    ???  Pulse  84    ???  Temp  98.5 ??F (36.9 ??C)    ???  Resp  18    ???  Ht  5\' 5"  (1.651 m)    ???  Wt  110.224 kg (243 lb)    ???  BMI  40.44 kg/m2    ???  SpO2  98%      Physical Exam:   Gen: Obese, disheveled,  in no acute distress   HEENT: Pink conjunctivae, PERRL, hearing intact to voice, moist mucous membranes   Neck: Supple, without masses, thyroid non-tender   Resp: No accessory muscle use, clear breath sounds without wheezes rales or rhonchi   Card: No murmurs, normal S1, S2 without thrills, bruits or peripheral edema   Abd: Soft, non-tender, non-distended, normoactive bowel sounds are present, no mass   Lymph: No cervical or inguinal adenopathy   Musc: No cyanosis or clubbing   Skin: No rashes or ulcers, skin turgor is good   Neuro: Cranial nerves are grossly intact, no focal motor weakness, follows commands appropriately   Psych: Good insight, oriented to person, place and time, alert    Disposition: home    Patient Instructions:   Current Discharge Medication List      START taking these medications    Details   b complex-vitamin c-folic acid (NEPHROCAPS) 1 mg capsule Take 1 capsule by mouth daily for 30 days.  Qty: 30 capsule, Refills: 0         CONTINUE these medications which have CHANGED    Details   amlodipine (NORVASC) 5 mg tablet Take 1 tablet by mouth daily for 30 days.  Qty: 30 tablet, Refills: 0      calcium acetate  (PHOSLO) 667 mg cap Take 2 capsules by mouth three (3) times daily (with meals) for 30 days.  Qty: 180 capsule, Refills: 0      lisinopril (PRINIVIL, ZESTRIL) 20 mg tablet Take 1 tablet by mouth daily for 30 days.  Qty: 30 tablet, Refills: 0         STOP taking these medications       folic acid (FOLVITE) 1 mg tablet Comments:   Reason for Stopping:         HYDROmorphone (DILAUDID) 8 mg tablet Comments:   Reason for Stopping:         hydroxyurea (HYDREA) 500 mg capsule Comments:   Reason for Stopping:         HYDROmorphone (DILAUDID) 8 mg tablet Comments:   Reason for Stopping:         diphenhydrAMINE (BENADRYL) 50 mg tablet Comments:   Reason for Stopping:         ondansetron (ZOFRAN ODT) 8 mg disintegrating tablet Comments:   Reason for Stopping:         b complex-vitamin c-folic acid 0.8 mg (NEPHRO-VITE) 0.8 mg Tab tablet Comments:   Reason for Stopping:         promethazine (PHENERGAN) 25 mg tablet Comments:   Reason for Stopping:             Activity: Activity as tolerated  Diet: Renal Diet  Wound Care: None needed    Follow-up with any PCP or kidney doctor in a few days.  Follow-up tests/labs - Dialysis    Signed:  Salvadore Dom, MD  09/06/2013  7:55 AM

## 2013-09-06 NOTE — ED Notes (Signed)
Bladder scanner measured 383 one time before the battery died. IT consulted for maintenance of bladder scanner.

## 2013-09-06 NOTE — Progress Notes (Signed)
Bedside shift change report given to Melissa RN (oncoming nurse) by JulieRN (offgoing nurse). Report included the following information SBAR, Kardex, Intake/Output, MAR and Recent Results.

## 2013-09-06 NOTE — Progress Notes (Signed)
Pt signed AMA papers after talking with Dr. Eulah Pont.  Pt has refused HD, states "I'll get it somewhere else."  Pt given ice chips as requested.  Reports that he has called his ride.  RN cut off arm bands as requested and given his cane as requested.  Pt is in no rush to leave and is setting in bed, easily agitated. Pt has a belongings bag in room, states "I know what I need to do."  Light turned out as requested.  0800  Pt has refused Md's Rx's: Norvasc 5 mg, Lisinopril 20 mg, Nephrocaps 1 cap and Phoslo 667 mg cap, all 30 day supply no refill. Pt left with his belongings in a bag, L chest hickman line that was placed prior to admission is intact and was ambulating with his cane. Pt denied pain.

## 2013-09-06 NOTE — ED Notes (Signed)
Pt running VTACH, NP and MD notified, repeat CMP ordered.  [name] fistula in right upper arm assessed and bruit and thrill present.

## 2013-09-06 NOTE — ED Provider Notes (Addendum)
HPI Comments: Tyler Ballard is a 32 y.o. male presenting ambulatory to ED c/o nausea and 5 episodes of vomiting x 3 days. Pt reports hx of sickle cell anemia since 11 months of age and complains of generalized arthralgias attributed to sickle cell disease. Pt notes hx of CKD and states he is a dialysis pt. Pt states he was admitted to [facility] in [place], [place] in 08/14 for similar sxs, and states he had dialysis done at the hospital. Pt states he moved to [place] from [place] in 08/14 and has not yet had dialysis done or has not established a PCP in [place]. Pt notes he has a fistula in his R arm, but denies need for dialysis. Pt also notes he has a Hickman catheter in place which he routinely flushes. Pt also complains of BLLE swelling. Pt reports no PO intake x 2 days ago secondary to sxs. Pt specifically denies SOB, cough, wheezing, or ABD pain.      PMHx Significant For: sickle cell disease, HTN, CKD  PSHx Significant For: right hip surgery  Social Hx: + tobacco (0.25 PPD), - EtOH, - drugs      There are no other complaints, changes or physical findings at this time.     Written by Philis Nettle Inamdar, ED Scribe; as dictated by Clayborn Bigness, NP.     The history is provided by the patient. No language interpreter was used.        Past Medical History   Diagnosis Date   ??? HTN (hypertension)    ??? Sickle cell disease      TOLD BY PATIENT AND NOT TRUE   ??? Malingering      Patient states has had sickle cell disease since age of 1, hemoglobin electrophoresis at [facility] hospital and [facility] both negative for for sickle cell disease   ??? ESRD on hemodialysis    ??? Chronic kidney disease         Past Surgical History   Procedure Laterality Date   ??? Hx orthopaedic       right hip          Family History   Problem Relation Age of Onset   ??? Sickle Cell Anemia     ??? Cancer Mother    ??? Sickle Cell Anemia Mother    ??? Hypertension Father    ??? Sickle Cell Anemia Father         History     Social History   ???  Marital Status: SINGLE     Spouse Name: N/A     Number of Children: N/A   ??? Years of Education: N/A     Occupational History   ??? Not on file.     Social History Main Topics   ??? Smoking status: Current Every Day Smoker -- 0.25 packs/day   ??? Smokeless tobacco: Not on file   ??? Alcohol Use: No   ??? Drug Use: No   ??? Sexually Active: No     Other Topics Concern   ??? Not on file     Social History Narrative   ??? No narrative on file                  ALLERGIES: Review of patient's allergies indicates no known allergies.      Review of Systems   Constitutional: Negative.  Negative for fever, chills, activity change, appetite change, fatigue and unexpected weight change.   HENT: Negative.  Negative for  hearing loss, congestion, rhinorrhea, sneezing, neck stiffness and voice change.    Eyes: Negative.  Negative for pain and visual disturbance.   Respiratory: Negative.  Negative for apnea, cough, choking, chest tightness, shortness of breath and wheezing.    Cardiovascular: Positive for leg swelling (BL). Negative for chest pain and palpitations.   Gastrointestinal: Positive for nausea and vomiting. Negative for abdominal pain, diarrhea, blood in stool and abdominal distention.   Genitourinary: Negative.  Negative for urgency, frequency, flank pain and difficulty urinating.        No discharge   Musculoskeletal: Positive for arthralgias (generalized). Negative for myalgias and back pain.   Skin: Negative.  Negative for color change and rash.   Neurological: Negative.  Negative for dizziness, seizures, syncope, speech difficulty, weakness, numbness and headaches.   Hematological: Negative for adenopathy.   Psychiatric/Behavioral: Negative.  Negative for suicidal ideas, behavioral problems, dysphoric mood and agitation. The patient is not nervous/anxious.        Filed Vitals:    09/06/13 0925 09/06/13 1111   BP: 170/91 147/81   Pulse: 103 84   Temp: 98.4 ??F (36.9 ??C)    Resp: 18 18   Height: 5\' 5"  (1.651 m)    Weight: 81.647 kg (180  lb)    SpO2: 94% 96%            Physical Exam   Nursing note and vitals reviewed.  Constitutional: He is oriented to person, place, and time. He appears well-developed and well-nourished. He appears distressed.   HENT:   Head: Normocephalic and atraumatic.   Eyes: Conjunctivae and EOM are normal. Pupils are equal, round, and reactive to light. Right eye exhibits no discharge. Left eye exhibits no discharge.   Neck: Normal range of motion. Neck supple.   Cardiovascular: Normal rate and regular rhythm.    Fistula L arm with bruit     Pulmonary/Chest: Effort normal and breath sounds normal. He has no wheezes. He has no rales. He exhibits no tenderness.   Noted Hickman catheter L chest wall no signs of infection   Abdominal: Soft. Bowel sounds are normal. There is no tenderness.   Musculoskeletal: Normal range of motion. He exhibits edema.   Lower extremity edema with dry sclerotic skin noted   Lymphadenopathy:     He has no cervical adenopathy.   Neurological: He is alert and oriented to person, place, and time.   Skin: Skin is warm and dry.   Psychiatric: He has a normal mood and affect. His behavior is normal.        MDM     Differential Diagnosis; Clinical Impression; Plan:     DDx: renal failure, CHF, viral illness, sickle cell crisis  Amount and/or Complexity of Data Reviewed:   Clinical lab tests:  Ordered and reviewed  Tests in the radiology section of CPT??:  Ordered and reviewed  Tests in the medicine section of the CPT??:  Ordered and reviewed   Review and summarize past medical records:  Yes   Discuss the patient with another provider:  Yes (Dr. Megan Mans (Hospitalist))   Independant visualization of image, tracing, or specimen:  Yes  Critical Care:   Total time providing critical care:  75-105 minutes (Excluding all other billable procedures.)  Progress:   Patient progress:  Improved      Procedures    EKG interpretation: (Preliminary)  11:22 AM  Rhythm: normal sinus rhythm; and regular . Rate (approx.): 85;  Axis: normal; P wave: normal; QRS interval:  normal ; QT/QTc: anterior Q waves; ST/T wave: normal; Other findings: non-ischemic.  Written by Philis Nettle Inamdar, ED Scribe, as dictated by Odis Hollingshead. Dorothey Baseman, MD.      CONSULT NOTE:   12:10 PM  Susangeline S. Dorothey Baseman, MD spoke with Dr. Megan Mans,   Specialty: Hospitalist  Discussed pt's hx, disposition, and available diagnostic and imaging results. Reviewed care plans. Consultant will evaluate pt for admission.  Written by Philis Nettle Inamdar, ED Scribe, as dictated by Odis Hollingshead. Dorothey Baseman, MD.       LABORATORY TESTS:  Recent Results (from the past 12 hour(s))   METABOLIC PANEL, COMPREHENSIVE    Collection Time     09/06/13 10:50 AM       Result Value Range    Sodium 139  136 - 145 mmol/L    Potassium 4.2  3.5 - 5.1 mmol/L    Chloride 99  97 - 108 mmol/L    CO2 30  21 - 32 mmol/L    Anion gap 10  5 - 15 mmol/L    Glucose 81  65 - 100 mg/dL    BUN 31 (*) 6 - 20 MG/DL    Creatinine 1.61 (*) 0.45 - 1.15 MG/DL    BUN/Creatinine ratio 6 (*) 12 - 20      GFR est AA 16 (*) >60 ml/min/1.42m2    GFR est non-AA 13 (*) >60 ml/min/1.56m2    Calcium 8.0 (*) 8.5 - 10.1 MG/DL    Bilirubin, total 0.4  0.2 - 1.0 MG/DL    ALT 14  12 - 78 U/L    AST 19  15 - 37 U/L    Alk. phosphatase 94  45 - 117 U/L    Protein, total 9.4 (*) 6.4 - 8.2 g/dL    Albumin 2.3 (*) 3.5 - 5.0 g/dL    Globulin 7.1 (*) 2.0 - 4.0 g/dL    A-G Ratio 0.3 (*) 1.1 - 2.2     CBC WITH AUTOMATED DIFF    Collection Time     09/06/13 10:50 AM       Result Value Range    WBC 3.2 (*) 4.1 - 11.1 K/uL    RBC 3.09 (*) 4.10 - 5.70 M/uL    HGB 8.7 (*) 12.1 - 17.0 g/dL    HCT 09.6 (*) 04.5 - 50.3 %    MCV 89.3  80.0 - 99.0 FL    MCH 28.2  26.0 - 34.0 PG    MCHC 31.5  30.0 - 36.5 g/dL    RDW 40.9 (*) 81.1 - 14.5 %    PLATELET 140 (*) 150 - 400 K/uL    NEUTROPHILS 65  32 - 75 %    LYMPHOCYTES 18  12 - 49 %    MONOCYTES 11  5 - 13 %    EOSINOPHILS 6  0 - 7 %    BASOPHILS 0  0 - 1 %    ABS. NEUTROPHILS 2.0  1.8 - 8.0 K/UL    ABS.  LYMPHOCYTES 0.6 (*) 0.8 - 3.5 K/UL    ABS. MONOCYTES 0.4  0.0 - 1.0 K/UL    ABS. EOSINOPHILS 0.2  0.0 - 0.4 K/UL    ABS. BASOPHILS 0.0  0.0 - 0.1 K/UL    DF SMEAR SCANNED      RBC COMMENTS NORMOCYTIC, NORMOCHROMIC     MAGNESIUM    Collection Time     09/06/13 10:50 AM       Result Value Range  Magnesium 2.0  1.6 - 2.4 mg/dL   TROPONIN I    Collection Time     09/06/13 10:50 AM       Result Value Range    Troponin-I, Qt. <0.04  <0.05 ng/mL   CK W/ CKMB & INDEX    Collection Time     09/06/13 10:50 AM       Result Value Range    CK 61  39 - 308 U/L    CK - MB <0.5 (*) 0.5 - 3.6 NG/ML    CK-MB Index CANNOT BE CALCULATED  0 - 2.5     PRO-BNP    Collection Time     09/06/13 10:50 AM       Result Value Range    NT pro-BNP 14911 (*) 0 - 125 PG/ML   EKG, 12 LEAD, INITIAL    Collection Time     09/06/13 11:22 AM       Result Value Range    Ventricular Rate 85      Atrial Rate 85      P-R Interval 178      QRS Duration 82      Q-T Interval 412      QTC Calculation (Bezet) 490      Calculated P Axis 45      Calculated R Axis 38      Calculated T Axis 47      Diagnosis        Value: Normal sinus rhythm      Possible Anterior infarct , age undetermined      No previous ECGs available       IMAGING RESULTS:    XR CHEST PORT (Final result)  Result time: 09/06/13 10:39:50      Final result by Rad Results In Edi (09/06/13 10:39:50)      Narrative:    **Final Report**      ICD Codes / Adm.Diagnosis: 160335 120 / Joint Pain Vomiting  Examination: CR CHEST PORT - [id number] - Sep 06 2013 10:33AM  Accession No: [id number]  Reason: chest pain      REPORT:  INDICATION: chest pain    EXAM: Chest single view.    COMPARISON: None.Marland Kitchen    FINDINGS: A single frontal view of the chest at 1025 hours shows milder   social prominence or infiltrate. Central venous catheter from the left shows   its tip over the superior vena cava. There is no pneumothorax. Mesh stent   material projects over the right subclavian region and also over the left   subclavian  and axillary regions... The heart, mediastinum and pulmonary   vasculature show heart size upper normal to mildly enlarged. . The bony   thorax is unremarkable for age. Marland Kitchen      IMPRESSION:    1. Mild interstitial edema pattern is suspected.  2. Borderline cardiomegaly.  3. Left central venous catheter.  4. Vascular stent bilaterally. Correlate with known clinical history. There   are no prior studies for comparison. .          Signing/Reading Doctor: Creed Copper [phone])   Approved: Creed Copper [phone]) Sep 06 2013 10:37AM            MEDICATIONS GIVEN:  Medications   hydromorphone (PF) (DILAUDID) injection 1 mg (not administered)   promethazine (PHENERGAN) injection 12.5 mg (not administered)   diphenhydrAMINE (BENADRYL) injection 25 mg (not administered)   hydromorphone (PF) (DILAUDID) injection 1 mg (1 mg IntraVENous Given 09/06/13 1053)  diphenhydrAMINE (BENADRYL) injection 12.5 mg (12.5 mg IntraVENous Given 09/06/13 1051)   ondansetron (ZOFRAN) injection 4 mg (4 mg IntraVENous Given 09/06/13 1052)       IMPRESSION:  1. Acute renal failure    Patient has been evaluated by Dr Moises Blood hospitalist and Dr Tresa Endo cardiologist Dr Dorothey Baseman has discussed findings with DR [facility] nephrology.   Patient stated  He did not want to be admitted He states he needs more pain medicaitions patient signed AMA Ellsworth Lennox, NP    PLAN:  1. Admit      ADMIT NOTE:  12:13 PM  Patient is being admitted to the hospital by Dr. Megan Mans. The results of their tests and reasons for their admission have been discussed with the patient and/or available family. They convey agreement and understanding for the need to be admitted and for their admission diagnosis.   Written by Philis Nettle Inamdar, ED Scribe, as dictated by Clayborn Bigness, NP.

## 2013-09-06 NOTE — ED Notes (Signed)
Spoke to Dr. Megan Mans about pt and lack of urine output after Bumex administration. Dr. Megan Mans clarified order to call nephrologist if no urine output for one after Bumex.   Pt given meal tray.  I explained to the pt that we need to put in a Foley catheter to measure his output and the pt refused.    I explained to the pt that we will scan his bladder with the ultra sound after he eats.

## 2013-09-06 NOTE — ED Notes (Signed)
Angie notified of pt asking for more, no orders taken at this time.

## 2013-09-06 NOTE — ED Notes (Signed)
Pt sts nothing is being done so  I am leaving now.  Pt encouraged to stay for assessment and treatment.  Pt allowing portable xray.  Angie, NP notified.

## 2013-09-06 NOTE — Progress Notes (Signed)
Consult dictated [phone]

## 2013-09-06 NOTE — Consults (Signed)
Name:       Tyler Ballard, Tyler Ballard          Admitted:          09/06/2013                                         DOB:               10/29/1980  Account #:  [id number]               Age:               32  Consultant: Demetrio Lapping, MD         Location                                CONSULTATION REPORT    DATE OF CONSULTATION           09/06/2013      CHIEF COMPLAINT: Nausea, vomiting and joint and back pain.    HISTORY OF PRESENT ILLNESS: This is a 32 year old male patient who came in  the emergency room saying that he has sickle cell anemia and saying that he  is in sickle cell crisis, and he also has a fistula and he stated that he  is not on dialysis. He stated that his last dialysis was 4 to 5 months ago. When I  told him about his last dialysis per records is about one and half month ago   at  [facility] admission to [facility], [place], he stated,   "yeah, it may be a month ago." He is a very poor historian. I am  not sure whether he is getting his dialysis appropriately or not. The  patient stated that he is here to get his Dilaudid. He is in pain, he is in  sickle cell crisis. There is a clear note from the chart review from the  [facility] the patient does not have sickle cell anemia and his  hemoglobin S from 08/03/2013 was 0.00. His hemoglobin A is 94.7. He denied  having any shortness of breath. He is complaining his lower extremity pain  for about 2 weeks. Denied any tingling or numbness or headache or neck pain.  No cough, no sputum, no fever, no chills.    REVIEW OF SYSTEMS: Not sure whether he is giving me right history except  that he is complaining of multiple joint pains and back pain and  complaining of sickle cell crisis. So he stated most of the symptoms to  no.  CONSTITUTIONAL: Negative fever. Negative chills. No fatigue.  HEENT: No headache or neck pain or neck stiffness.  EYES: No change in his vision.  RESPIRATORY: Negative for cough or shortness of breath. Although he  seems  to be mildly dyspneic, he denied having any shortness of breath.  CARDIOVASCULAR: Negative for any chest pain, palpitations or dizziness. He  has chronic leg swelling, but he denied them getting worse.  GASTROINTESTINAL: Denied any nausea, vomiting, abdominal pain, diarrhea, or  constipation.  GENITOURINARY: Denied having any change in his urination, although he has  not urinated since he came to the emergency room in spite of getting Bumex  4 mg IV.  MUSCULOSKELETAL: Positive for back pain and joint pains, stating that he  has sickle cell crisis.  SKIN: He has chronic venous stasis changes on his chronic bilateral lower extremities with  chronic venous changes.  NEUROLOGIC: Denied any headaches or dizziness, or seizures, or syncope. No  tingling and numbness. Complaining only of pain as I described above, all  over his body.  PSYCHIATRIC: He seems to have behavior issues, but he has not in agitated  state. He basically signed against medical advice before he could be  admitted to the hospital.    PAST MEDICAL HISTORY: Hypertension, chronic kidney disease. HIV positive is  documented in his old chart from [facility]. The patient also  agreed that he is HIV positive for a few years and not been on medications.      PAST SURGICAL HISTORY: He has an upper extremity fistula and Hickman  catheter.    SOCIAL HISTORY: Smokes tobacco, about 1/4 pack every day. Denied any  illicit drug use or alcohol use.    FAMILY HISTORY: He stated that his mother and father have sickle cell  anemia and hypertension.    ALLERGIES: NO KNOWN DRUG ALLERGIES.    CODE STATUS: FULL CODE    HOME MEDICATIONS: Per chart medication reconciliation, he is on  1. Dilaudid.  2. Amlodipine 10 mg p.o. daily.  3. Folic acid 400 mcg p.o. daily.  4. Hydroxyurea 200 mg p.o. daily.  5. Lisinopril 10 mg p.o. daily.  Unknown last doses.    PHYSICAL EXAMINATION  VITAL SIGNS: Temperature 98.4, pulse 84, blood pressure 174/85, respiratory  rate  18, oxygen saturation 94 to 100% on 2 L nasal cannula.  GENERAL: The patient is not in apparent distress.  HEENT: Atraumatic, normocephalic head. All his face looks a little  swollen.  NECK: Full. No jugular venous distention noticed. Neck is supple and full.  CARDIOVASCULAR: S1, S2, regular rhythmic rate. No murmurs, gallops, or  rubs.  CHEST: Lower 1/3 of lung fields on posterior side has rales, but no  wheezing, no rhonchi. Vesicular breath sounds present bilaterally  otherwise.  ABDOMEN: Soft, nontender, normoactive bowel sounds present. No rebound, no  guarding, no rigidity.  EXTREMITIES: Have 3-4+ lower extremity edema up until the thighs and tender  on touch and has chronic venous stasis changes, chronic hyperpigmented venous changes present.  PSYCHIATRIC: Seems to have behavior issues. He is not giving me right information, but  does not seem to be in a depressive or anxiety state.  MUSCULOSKELETAL: No focal joint effusion or erythema noted, but his  bilateral lower extremities are edematous.  CENTRAL NERVOUS SYSTEM: Awake, alert, oriented x3. Cranial nerves 2-12 are  intact. Gait is normal. Motor 5/5 in bilateral upper and lower  extremities.  SKIN: He has a fistula present in his left upper extremity.    LABORATORY DATA: CBC: WBC 3.2, hemoglobin 8.7, hematocrit 27.6, platelets  140,000. Chemistry: Sodium 141, potassium 4.3, chloride 101, bicarbonate  31, BUN 30, creatinine 5.47. Troponin less than 0.04. Magnesium 1.9. Chest  x-ray portable showed mild interstitial pulmonary edema pattern, borderline  cardiomegaly, left central venous catheter. Vascular stent bilaterally.    ASSESSMENT AND PLAN: This 32 year old male patient came to the emergency  room complaining that he has a sickle cell pain crisis, found to have  pulmonary edema and chronic kidney disease.  1. Chronic kidney disease with fluid overload/pulmonary edema, present on  admission. Recommended to get dialysis. Discussed the case with Dr.  Inocencio Homes, nephrologist who  arranged the dialysis through [facility], but as the patient signed against  medical advice, he  refused to get treatment of dialysis.  2. Nonsustained ventricular tachycardia. While in the emergency room, Dr.  Tresa Endo, cardiologist came to see the patient. He explained to him that he has a dangerous  heart rhythm, that he could drop dead if he leaves the hospital. The  patient said he understood and in spite of that, he wanted to leave the  hospital. He stated if he does not get his Dilaudid, he does not want to  stay anywhere, he is here only for his Dilaudid. He does not want to get  any other medical treatment. The Nurse is in the patient room and I have  explained that he could drop dead and it was written on his against medical  advice paper and the patient signed the paper and left the hospital, even  before being admitted to hospital.                Demetrio Lapping, MD    cc:                       Demetrio Lapping, MD      SB/wmx; D: 09/06/2013 07:45 P; T: 09/06/2013 09:14 P; DOC# [id number]; Job#  [id number]

## 2013-09-06 NOTE — Progress Notes (Signed)
Tyler Ballard is being admitted to telemetry unit for pulmonary edema. Pt moved to [place] from [place]. Pt had been getting HD previously and has permanent access placed. Pt not currently receiving outpt HD or seeing a PCP. CM will continue to follow.    Selinda Michaels, MSW, [phone]

## 2013-09-06 NOTE — Consults (Signed)
Cardiology consultation:    The patient is a 32 year old male coming to ED demanding narcotic pain treatment for "sickle cell disease" and refusing all other measures. He is a noncompliant hemodialysis patient. He was observed to have a wide complex rhythm on the monitor consistent with V tach. There is some question because of possible concealed background QRS that is could be artifact but this is quite uncertain and he is uncooperative with further evaluation. 12 lead EKG does not show any acute change and does not suggest electrolyte imbalance.     He refused interview and examination and announced that he was signing out, demonstrating detailed knowledge of the AMA process.     Hgb is 8.7, electrolytes are normal. BUN/Cr are 31/5.14. Albumin is 2.3 with globulin of 7.1. NT pro BNP is 14,911. . Cardiac markers are negative. 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 Although he would not permit physical exam he has severe edema and pronounced gynecomastia. The hepatic markers we do have are not elevated.     Impression: Drug seeking behavior    Antisocial personality disorder    End stage renal failure with noncompliance with RRT    Past record review suggests that the sickle cell disease is a fabrication    Treatment noncompliance with life threatening disorder    Fluid overload/CHF    Possible nonsustained V tach    Plan:  Patient will depart ED without further argument    Document above BH problems in the problem list for future reference    AMA consent includes mention of risk of sudden death    Thank you for this challenging consultation    Festus Holts MD [facility]

## 2013-09-06 NOTE — ED Notes (Signed)
Assumed pt care, Pt requesting Zofran for nausea and more pain medication, pt reports he is unable to urinate.

## 2013-09-06 NOTE — ED Notes (Signed)
Pt leaving AMA, Dr. Tresa Endo explained that he is very sick, has a dangerous heart rhythm and could drop dead, pt acknowledged understanding and signed out AMA anyway.

## 2013-09-06 NOTE — ED Notes (Addendum)
Accessed pt's Hickman w/o problems. Lab and first set of cultures drawn.  Flushed easily w/87ml of NS and pain meds given.  Pt cooperative and talking w/staff asking for pain meds.

## 2013-09-06 NOTE — ED Notes (Signed)
Patient reports to ED c/o allover joint pain and N/V/D for 3 days. Has not been able to keep any medications down since Wednesday. Patient ambulatory with cane. Denies any abdominal pain. Patient sitting on edge of bed in no apparent distress will continue to monitor.

## 2013-09-07 LAB — HEPATITIS PANEL, ACUTE
Hep B Core Ab, IgM: NEGATIVE
Hep B surface Ag screen: NEGATIVE
Hep C Virus Ab: 0.2 s/co ratio (ref 0.0–0.9)
Hepatitis A Ab, IgM: NEGATIVE

## 2013-09-07 LAB — EKG, 12 LEAD, INITIAL
Atrial Rate: 85 {beats}/min
Atrial Rate: 85 {beats}/min
Calculated P Axis: 45 degrees
Calculated P Axis: 56 degrees
Calculated R Axis: 38 degrees
Calculated R Axis: 41 degrees
Calculated T Axis: 47 degrees
Calculated T Axis: 76 degrees
Diagnosis: NORMAL
Diagnosis: NORMAL
P-R Interval: 178 ms
P-R Interval: 186 ms
Q-T Interval: 412 ms
Q-T Interval: 424 ms
QRS Duration: 82 ms
QRS Duration: 82 ms
QTC Calculation (Bezet): 490 ms
QTC Calculation (Bezet): 504 ms
Ventricular Rate: 85 {beats}/min
Ventricular Rate: 85 {beats}/min

## 2013-09-07 NOTE — Progress Notes (Signed)
The final results of the Hb electrophoresis showed  Hgb Solubility Negative Negative Final   Hemoglobin A 94.7 94.0 - 98.0 % Final   Hemoglobin S 0.0 0.0 % Final   Hemoglobin C 0.0 0.0 % Final   Hemoglobin A2 2.0 0.7 - 3.1 % Final   Hemoglobin F 3.3 (H) 0.0 - 2.0 % Final    Thus, patient has neither sickle cell anemia, nor sickle cell trait.  His statements otherwise, and his request for pain medication for a sickle cell crisis, convince me he is malingering and drug seeking.

## 2013-09-09 LAB — HEMOGLOBIN FRACTIONATION
HEMOGLOBIN A2: 2 % (ref 0.7–3.1)
HEMOGLOBIN F: 3.3 % — ABNORMAL HIGH (ref 0.0–2.0)
HEMOGLOBIN S: 0 %
HGB A: 94.7 % (ref 94.0–98.0)
HGB SOLUBILITY: NEGATIVE
Hemoglobin A2: 2 % (ref 0.7–3.1)
Hemoglobin A: 94.7 % (ref 94.0–98.0)
Hemoglobin C: 0 %
Hemoglobin C: 0 %
Hemoglobin F: 3.3 % — ABNORMAL HIGH (ref 0.0–2.0)
Hemoglobin S: 0 %
Hgb Solubility: NEGATIVE

## 2013-09-11 LAB — CULTURE, BLOOD
Culture result:: NO GROWTH
Culture result:: NO GROWTH

## 2013-09-11 NOTE — ED Provider Notes (Signed)
I personally saw and examined the patient.  I have reviewed and agree with the MLP's findings, including all diagnostic interpretations, and plans as written.   I was present during the key portions of separately billed procedures.    Shari Heritage, MD    Initial old records not avail due to name difference, later revd med records, pt with hx malingering, going from hospital to hospital, not follpwing up with nephrology, due to fluid overload, admit to hospitalist, pt later left ama when no addtl narcotics given

## 2013-09-29 LAB — CBC WITH AUTOMATED DIFF
ABS. BASOPHILS: 0 10*3/uL (ref 0.0–0.1)
ABS. EOSINOPHILS: 0 10*3/uL (ref 0.0–0.4)
ABS. LYMPHOCYTES: 1.1 10*3/uL (ref 0.9–3.6)
ABS. MONOCYTES: 0.5 10*3/uL (ref 0.05–1.2)
ABS. NEUTROPHILS: 2.3 10*3/uL (ref 1.8–8.0)
BASOPHILS: 1 % (ref 0–2)
EOSINOPHILS: 0 % (ref 0–5)
HCT: 30.1 % — ABNORMAL LOW (ref 36.0–48.0)
HGB: 10.3 g/dL — ABNORMAL LOW (ref 13.0–16.0)
LYMPHOCYTES: 27 % (ref 21–52)
MCH: 30 PG (ref 24.0–34.0)
MCHC: 34.2 g/dL (ref 31.0–37.0)
MCV: 87.8 FL (ref 74.0–97.0)
MONOCYTES: 12 % — ABNORMAL HIGH (ref 3–10)
MPV: 11.3 FL (ref 9.2–11.8)
NEUTROPHILS: 60 % (ref 40–73)
PLATELET: 123 10*3/uL — ABNORMAL LOW (ref 135–420)
RBC: 3.43 M/uL — ABNORMAL LOW (ref 4.70–5.50)
RDW: 16.4 % — ABNORMAL HIGH (ref 11.6–14.5)
WBC: 3.9 10*3/uL — ABNORMAL LOW (ref 4.6–13.2)

## 2013-09-29 LAB — METABOLIC PANEL, COMPREHENSIVE
A-G Ratio: 0.4 — ABNORMAL LOW (ref 0.8–1.7)
ALT (SGPT): 58 U/L (ref 16–61)
AST (SGOT): 45 U/L — ABNORMAL HIGH (ref 15–37)
Albumin: 3 g/dL — ABNORMAL LOW (ref 3.4–5.0)
Alk. phosphatase: 123 U/L — ABNORMAL HIGH (ref 45–117)
Anion gap: 14 mmol/L (ref 3.0–18)
BUN/Creatinine ratio: 10 — ABNORMAL LOW (ref 12–20)
BUN: 81 MG/DL — ABNORMAL HIGH (ref 7.0–18)
Bilirubin, total: 0.6 MG/DL (ref 0.2–1.0)
CO2: 27 mmol/L (ref 21–32)
Calcium: 9 MG/DL (ref 8.5–10.1)
Chloride: 92 mmol/L — ABNORMAL LOW (ref 100–108)
Creatinine: 8.26 MG/DL — ABNORMAL HIGH (ref 0.6–1.3)
GFR est AA: 10 mL/min/{1.73_m2} — ABNORMAL LOW (ref >60)
GFR est non-AA: 8 mL/min/{1.73_m2} — ABNORMAL LOW (ref >60)
Globulin: 7.5 g/dL — ABNORMAL HIGH (ref 2.0–4.0)
Glucose: 105 mg/dL — ABNORMAL HIGH (ref 74–99)
Potassium: 5.3 mmol/L (ref 3.5–5.5)
Protein, total: 10.5 g/dL — ABNORMAL HIGH (ref 6.4–8.2)
Sodium: 133 mmol/L — ABNORMAL LOW (ref 136–145)

## 2013-09-29 LAB — CARDIAC PANEL,(CK, CKMB & TROPONIN)
CK - MB: 1 ng/ml (ref 0.5–3.6)
CK-MB Index: 0.7 % (ref 0.0–4.0)
CK: 151 U/L (ref 39–308)
Troponin-I, QT: 0.04 NG/ML (ref 0.0–0.045)

## 2013-09-29 MED ADMIN — ondansetron (ZOFRAN) injection 4 mg: INTRAVENOUS | @ 18:00:00 | NDC 00409475503

## 2013-09-29 MED ADMIN — promethazine (PHENERGAN) injection 25 mg: INTRAVENOUS | @ 18:00:00 | NDC 00641092821

## 2013-09-29 MED ADMIN — hydromorphone (PF) (DILAUDID) injection 1 mg: INTRAVENOUS | @ 18:00:00 | NDC 00409128331

## 2013-09-29 MED ADMIN — morphine injection 4 mg: INTRAVENOUS | @ 18:00:00 | NDC 00409189101

## 2013-09-29 MED ADMIN — hydralazine (APRESOLINE) tablet 50 mg: ORAL | @ 19:00:00 | NDC 62584073411

## 2013-09-29 MED ADMIN — sodium chloride 0.9 % bolus infusion 1,000 mL: INTRAVENOUS | @ 18:00:00 | NDC 00409798309

## 2013-09-29 MED ADMIN — diphenhydrAMINE (BENADRYL) injection 50 mg: INTRAVENOUS | @ 18:00:00 | NDC 00641037621

## 2013-09-29 NOTE — ED Notes (Signed)
Pt c/o pain and dr. Arlyss Repress notified. Pt requesting dilaudid, phenergan, and benadryl

## 2013-09-29 NOTE — ED Notes (Signed)
Pt is a dialysis patient m/w/f and since last dialysis he has had a high potassium and he hs had n/v with left chest pain with coughing for 2 days and generalized pain

## 2013-09-29 NOTE — ED Provider Notes (Signed)
HPI Comments: Tyler Ballard is a 32 y.o. incarcerated male with a h/o HTN, Sickle Cell Anemia, and chronic kidney disease c/o CP, n/v/d, back pain , and joint pain. Pt reports n/v started 3 days ago, and he has had 4 episodes of vomiting today. He has not been able to hold medications down since then. CP is described as a pressure and has been constant on the left side x 2 days and is exacerbated by palpation. He also notes having a productive cough. Pain is similar to previous sickle cell pain. No fever, urinary symptoms, or other complaints at this time. Pt is currently on dialysis  (MWF).            Past Medical History   Diagnosis Date   ??? Sickle cell anemia      NEGATIVE test. Likely patient states this Dx for secondary gain   ??? HTN (hypertension) 07/06/2012   ??? Chronic kidney disease (CKD), stage V    ??? AVN (avascular necrosis of bone)      right hip   ??? Malingering      Sickle Cell testing negative   ??? HTN (hypertension)    ??? Sickle cell disease      TOLD BY PATIENT AND NOT TRUE   ??? Malingering      Patient states has had sickle cell disease since age of 24, hemoglobin electrophoresis at [facility] hospital and [facility] both negative for for sickle cell disease   ??? ESRD on hemodialysis    ??? Chronic kidney disease    ??? Noncompliance of patient with renal dialysis 09/06/2013        Past Surgical History   Procedure Laterality Date   ??? Hx orthopaedic  2012     Rt hip decompression   ??? Hx orthopaedic       rt hip    ??? Hx vascular access       av  fistula right arm.   ??? Hx orthopaedic       RIGHT HIP   ??? Hx vascular access       old avg left arm, avf right arm   ??? Hx orthopaedic       right hip surgery   ??? Hx orthopaedic       right hip          Family History   Problem Relation Age of Onset   ??? Sickle Cell Anemia Sister    ??? Hypertension Mother    ??? Sickle Cell Anemia     ??? Cancer Mother    ??? Sickle Cell Anemia Mother    ??? Hypertension Father    ??? Sickle Cell Anemia Father         History     Social History   ??? Marital  Status: SINGLE     Spouse Name: N/A     Number of Children: N/A   ??? Years of Education: N/A     Occupational History   ??? Not on file.     Social History Main Topics   ??? Smoking status: Current Every Day Smoker -- 0.25 packs/day for 10 years   ??? Smokeless tobacco: Not on file   ??? Alcohol Use: No   ??? Drug Use: No   ??? Sexually Active: No     Other Topics Concern   ??? Not on file     Social History Narrative    ** Merged History Encounter **         **  Merged History Encounter **         ** Merged History Encounter **         ** Merged History Encounter **                       ALLERGIES: Review of patient's allergies indicates no known allergies.      Review of Systems   Constitutional: Negative.  Negative for fever, chills, diaphoresis and fatigue.   HENT: Negative.  Negative for ear pain, congestion, sore throat, trouble swallowing and neck pain.    Eyes: Negative.  Negative for photophobia, pain, redness and visual disturbance.   Respiratory: Positive for cough and shortness of breath. Negative for chest tightness and wheezing.    Cardiovascular: Positive for chest pain. Negative for palpitations.   Gastrointestinal: Positive for nausea, vomiting and diarrhea. Negative for abdominal pain and blood in stool.   Genitourinary: Negative for dysuria and frequency.   Musculoskeletal: Positive for back pain. Negative for joint swelling.        Joint pain   Skin: Negative.  Negative for rash and wound.   Neurological: Negative.  Negative for seizures, syncope and headaches.   Psychiatric/Behavioral: Negative.  Negative for behavioral problems and confusion. The patient is not nervous/anxious.    All other systems reviewed and are negative.        Filed Vitals:    09/29/13 1319   BP: 217/91   Pulse: 66   Temp: 97.6 ??F (36.4 ??C)   Resp: 18   Height: 5\' 5"  (1.651 m)   Weight: 81.647 kg (180 lb)   SpO2: 100%            Physical Exam   Nursing note and vitals reviewed.  Constitutional: He is oriented to person, place, and time.  He appears well-developed and well-nourished. He is cooperative.  Non-toxic appearance. He does not have a sickly appearance. He does not appear ill. No distress.   HENT:   Head: Normocephalic and atraumatic.   Mouth/Throat: Oropharynx is clear and moist. No oropharyngeal exudate.   Eyes: Conjunctivae and EOM are normal. Pupils are equal, round, and reactive to light. No scleral icterus.   Neck: Trachea normal and normal range of motion. Neck supple. No hepatojugular reflux and no JVD present. No tracheal deviation present. No thyromegaly present.   Cardiovascular: Normal rate, regular rhythm, S1 normal, S2 normal, normal heart sounds, intact distal pulses and normal pulses.  Exam reveals no gallop, no S3 and no S4.    No murmur heard.  Pulses:       Radial pulses are 2+ on the right side, and 2+ on the left side.        Dorsalis pedis pulses are 2+ on the right side, and 2+ on the left side.       Pulmonary/Chest: Effort normal and breath sounds normal. No respiratory distress. He has no decreased breath sounds. He has no wheezes. He has no rhonchi. He has no rales.   Abdominal: Soft. Normal appearance and bowel sounds are normal. He exhibits no distension and no mass. There is no hepatosplenomegaly. There is no tenderness. There is no rigidity, no rebound, no guarding, no CVA tenderness, no tenderness at McBurney's point and negative Murphy's sign.   Musculoskeletal: Normal range of motion. He exhibits tenderness. He exhibits no edema.   Strength 4/5 throughout    Lymphadenopathy:        Head (right side): No submental, no submandibular,  no preauricular and no occipital adenopathy present.        Head (left side): No submental, no submandibular, no preauricular and no occipital adenopathy present.     He has no cervical adenopathy.        Right: No supraclavicular adenopathy present.        Left: No supraclavicular adenopathy present.   Neurological: He is alert and oriented to person, place, and time. He has  normal strength and normal reflexes. He is not disoriented. No cranial nerve deficit or sensory deficit. Coordination and gait normal. GCS eye subscore is 4. GCS verbal subscore is 5. GCS motor subscore is 6.   Grossly intact   Skin: Skin is warm, dry and intact. No rash noted. He is not diaphoretic.   Psychiatric: He has a normal mood and affect. His speech is normal and behavior is normal. Judgment and thought content normal. Cognition and memory are normal.        MDM     Differential Diagnosis; Clinical Impression; Plan:     Sickle cell  DIFFERENTIAL DIAGNOSES/ MEDICAL DECISION MAKING:  Chest pain etiologies include acute cardiac events to include possible acute myocardial infarction, acute coronary syndrome, pneumonia, chest wall pain (myofascial/ musculoskeletal etiology), chronic obstructive pulmonary disease (copd), acute asthma exacerbation, congestive heart failure, acute bronchitis, pulmonary embolism, upper respiratory infection, referred abdominal pain, other etiologies, versus combination of the above.        Procedures    Labs essentially normal with the exception of WBC: 3.9, Hemogloblin stable, Creatine is elevated at 8.26. Chest X-Ray showed cardiomegaly, no acute process. EKG showed NSR with rate of 66 bpm. With no ST elevations or depression and non specific T wave changes. 1:58 PM 09/29/2013    Patient was discharged in stable condition.  Patient was reassessed and feeling better.  Patient was discharged with no medications.  Patient is to return to emergency department if any new or worsening condition.  2:07 PM, 09/29/2013       Scribe Attestation:   September 29, 2013 at 1:34 PM - Rocky Link scribing for and in the presence of Dr.Magdalyn Arenivas, DO     [employer], Editor, commissioning:   I personally performed the services described in the documentation, reviewed the documentation, as recorded by the scribe in my presence, and it accurately and completely records my  words and actions. September 29, 2013 at 3:19 PM - Dr. Maura Crandall, DO

## 2013-09-29 NOTE — ED Notes (Signed)
Patient dbl lumen cather was access to draw blood and was flushed with 20 cc normal saline.

## 2013-09-29 NOTE — ED Notes (Signed)
Patient given verbal and written discharge instructions. Pt stated understanding and no further questions. Patient in NAD at time of discharge. Pt d/c'd via [facility] with [facility] guards to jail

## 2013-09-29 NOTE — ED Notes (Signed)
Pt brought in from jail and has 2 guards at bedside with him. Pt in NAD on monitors at this time. Pt medicated for generalized pain and is resting on stretcher at this time.

## 2013-09-29 NOTE — ED Notes (Signed)
Patient placed on cardiac monitors and continuous pulse oximetry.

## 2013-09-30 LAB — EKG, 12 LEAD, INITIAL
Atrial Rate: 66 {beats}/min
Calculated P Axis: 52 degrees
Calculated R Axis: -10 degrees
Calculated T Axis: 21 degrees
Diagnosis: NORMAL
P-R Interval: 176 ms
Q-T Interval: 498 ms
QRS Duration: 88 ms
QTC Calculation (Bezet): 522 ms
Ventricular Rate: 66 {beats}/min

## 2013-11-01 DIAGNOSIS — T80218A Other infection due to central venous catheter, initial encounter: Secondary | ICD-10-CM

## 2013-11-01 NOTE — ED Notes (Signed)
Pt brought to ER via police escort for evaluation of positive blood cultures with request to remove PICC line per officers. Pt reports nausea for 2 days and left upper chest pain today.

## 2013-11-01 NOTE — ED Notes (Signed)
Infected line is central line to LACW. Pt states to staff that it is PICC line. Assessment revealed otherwise. Pt continues to call PICC line.

## 2013-11-01 NOTE — ED Provider Notes (Signed)
[facility] EMERGENCY DEPT       32 y.o. male with noted past medical history who presents to the emergency department for evaluation of positive blood cultures. PT states that he was diagnosed with pancreatic CA in [place] 2-3 weeks ago. Pt is on dialysis, they use a graft in his right arm. Pt has a central venus line on his left chest, the bandage was last changed 11/1. Pt had some blood cultures drawn, staff at the jail were called today and informed that they were positive so thy sent PT to the ED. Pt states that he has had upper abdominal pain, nausea and vomiting since this morning. Pt denies fever, chills, lower abdominal pain and any other Sx or complaints. Pt denies any known cardiac Hx.     No other complaints.     Current Facility-Administered Medications   Medication Dose Route Frequency   ??? vancomycin (VANCOCIN) 1,000 mg in 0.9% sodium chloride (MBP/ADV) 250 mL adv  1,000 mg IntraVENous NOW   ??? Gentamicin in Saline (Iso-osm) (GARAMYCIN) 100 mg/100 mL IVPB premix 100 mg  100 mg IntraVENous ONCE     Current Outpatient Prescriptions   Medication Sig   ??? epoetin alfa (EPOGEN) 10,000 unit/mL injection by SubCUTAneous route once.   ??? doxercalciferol (HECTOROL) 4 mcg/2 mL injection by IntraVENous route.   ??? traMADol (ULTRAM) 50 mg tablet Take 50 mg by mouth every six (6) hours as needed for Pain.   ??? calcium acetate (PHOSLO) 667 mg cap Take  by mouth three (3) times daily (with meals).   ??? mineral oil-isopropyl myristat (HYDROCERIN) lotion Apply  to affected area as needed for Dry Skin.   ??? cloNIDine HCl (CATAPRES) 0.3 mg tablet Take 0.3 mg by mouth two (2) times a day.   ??? lisinopril (PRINIVIL, ZESTRIL) 10 mg tablet Take  by mouth daily.   ??? atenolol (TENORMIN) 50 mg tablet Take  by mouth daily.   ??? hydralazine (APRESOLINE) 50 mg tablet Take 100 mg by mouth three (3) times daily. Indications: HYPERTENSION       Past Medical History   Diagnosis Date   ??? Sickle cell anemia      NEGATIVE test. Likely  patient states this Dx for secondary gain   ??? HTN (hypertension) 07/06/2012   ??? Chronic kidney disease (CKD), stage V    ??? AVN (avascular necrosis of bone)      right hip   ??? Malingering      Sickle Cell testing negative   ??? HTN (hypertension)    ??? Sickle cell disease      TOLD BY PATIENT AND NOT TRUE   ??? Malingering      Patient states has had sickle cell disease since age of 39, hemoglobin electrophoresis at [facility] hospital and [facility] both negative for for sickle cell disease   ??? ESRD on hemodialysis    ??? Chronic kidney disease    ??? Noncompliance of patient with renal dialysis 09/06/2013   ??? HIV (human immunodeficiency virus infection)        Past Surgical History   Procedure Laterality Date   ??? Hx orthopaedic  2012     Rt hip decompression   ??? Hx orthopaedic       rt hip    ??? Hx vascular access       av  fistula right arm.   ??? Hx orthopaedic       RIGHT HIP   ??? Hx vascular access  old avg left arm, avf right arm   ??? Hx orthopaedic       right hip surgery   ??? Hx orthopaedic       right hip        Family History   Problem Relation Age of Onset   ??? Sickle Cell Anemia Sister    ??? Hypertension Mother    ??? Sickle Cell Anemia     ??? Cancer Mother    ??? Sickle Cell Anemia Mother    ??? Hypertension Father    ??? Sickle Cell Anemia Father        History     Social History   ??? Marital Status: SINGLE     Spouse Name: N/A     Number of Children: N/A   ??? Years of Education: N/A     Occupational History   ??? Not on file.     Social History Main Topics   ??? Smoking status: Current Every Day Smoker -- 0.25 packs/day for 10 years   ??? Smokeless tobacco: Not on file   ??? Alcohol Use: No   ??? Drug Use: No   ??? Sexually Active: No     Other Topics Concern   ??? Not on file     Social History Narrative    ** Merged History Encounter **         ** Merged History Encounter **         ** Merged History Encounter **         ** Merged History Encounter **            No Known Allergies    Patient's primary care provider (as noted in EPIC):  Phys Other,  MD    REVIEW OF SYSTEMS:    Constitutional:  Negative for diaphoresis.  HENT:  Negative for congestion.    Respiratory:  Negative for cough and shortness of breath.    Cardiovascular:  Negative for chest pain and palpitations.   Gastrointestinal:  Negative for diarrhea.  Genitourinary:  Negative for flank pain.   Musculoskeletal:  Negative for back pain.   Skin:  Negative for pallor.   Neurological:  Negative for focal numbness, weakness or tingling.  Negative for slurred speech.    Visit Vitals   Item Reading   ??? BP 117/47   ??? Pulse 73   ??? Temp 98.1 ??F (36.7 ??C)   ??? Resp 19   ??? Ht 5\' 5"  (1.651 m)   ??? Wt 81.647 kg (180 lb)   ??? BMI 29.95 kg/m2   ??? SpO2 96%       PHYSICAL EXAM:    CONSTITUTIONAL:  Alert, in no apparent distress;  well developed;  well nourished.  HEAD:  Normocephalic, atraumatic.  EYES:  EOMI.  Non-icteric sclera.  Normal conjunctiva.  ENTM:  Nose:  no rhinorrhea.  Throat:  no erythema or exudate, mucous membranes moist.  NECK:  No JVD.  Supple  RESPIRATORY:  Chest clear, equal breath sounds, good air movement.  CARDIOVASCULAR:  Regular rate and rhythm.  No murmurs, rubs, or gallops.    Chest:  No chest TTP.  No rash, lesions, bruising.  Left chest has central catheter in place with obs site.  Removal of obs site shows catheter site is clean, dry, intact.  No discharge and no foul odor.     GI:  Normal bowel sounds, abdomen soft with upper abd TTP, greatest in epigastrium.  No rebound or guarding.  BACK:  Non-tender.    UPPER EXT:  Normal inspection.  Right arm:  Right AV fistula with good bruit.     LOWER EXT:  No edema, no calf tenderness.  Distal pulses intact.  NEURO:  Moves all four extremities.  Normal motor exam and sensation in all four extremities.  Normal CN II-XII exam.  Normal bilateral finger-to-nose exam.     SKIN:  No rashes;  Normal for age.  PSYCH:  Alert and normal affect.    DIFFERENTIAL DIAGNOSES/ MEDICAL DECISION MAKING:   Dehydration, hyperglycemia-induced weakness, electrolyte  and/or endocrine imbalance, CVA, intracranial hemorrhage, sepsis, cardiac arrhythmia, central versus peripheral vertigo, illicit drug intoxication, alcohol intoxication, prescribed drug toxicity, pregnancy in females patients, anxiety disorder, versus other etiologies or a combination of the above.      ED COURSE:      Abnormal lab results from this emergency department encounter:  Labs Reviewed   CBC WITH AUTOMATED DIFF - Abnormal; Notable for the following:     WBC 2.1 (*)     RBC 3.10 (*)     HGB 9.3 (*)     HCT 29.4 (*)     RDW 15.4 (*)     PLATELET 110 (*)     All other components within normal limits   METABOLIC PANEL, BASIC - Abnormal; Notable for the following:     Chloride 97 (*)     Glucose 158 (*)     BUN 22 (*)     Creatinine 4.16 (*)     BUN/Creatinine ratio 5 (*)     GFR est AA 21 (*)     GFR est non-AA 18 (*)     All other components within normal limits   LIPASE - Abnormal; Notable for the following:     Lipase 517 (*)     All other components within normal limits   HEPATIC FUNCTION PANEL - Abnormal; Notable for the following:     Protein, total 9.8 (*)     Albumin 3.0 (*)     Globulin 6.8 (*)     A-G Ratio 0.4 (*)     Alk. phosphatase 163 (*)     All other components within normal limits   CULTURE, BLOOD       Lab values for this patient within approximately the last 12 hours:  Recent Results (from the past 12 hour(s))   CBC WITH AUTOMATED DIFF    Collection Time     11/01/13 11:14 PM       Result Value Range    WBC 2.1 (*) 4.6 - 13.2 K/uL    RBC 3.10 (*) 4.70 - 5.50 M/uL    HGB 9.3 (*) 13.0 - 16.0 g/dL    HCT 16.1 (*) 09.6 - 48.0 %    MCV 94.8  74.0 - 97.0 FL    MCH 30.0  24.0 - 34.0 PG    MCHC 31.6  31.0 - 37.0 g/dL    RDW 04.5 (*) 40.9 - 14.5 %    PLATELET 110 (*) 135 - 420 K/uL    MPV 9.5  9.2 - 11.8 FL    NEUTROPHILS PENDING      LYMPHOCYTES PENDING      MONOCYTES PENDING      EOSINOPHILS PENDING      BASOPHILS PENDING      ABS. NEUTROPHILS PENDING      ABS. LYMPHOCYTES PENDING      ABS.  MONOCYTES PENDING  ABS. EOSINOPHILS PENDING      ABS. BASOPHILS PENDING      DF PENDING     METABOLIC PANEL, BASIC    Collection Time     11/01/13 11:14 PM       Result Value Range    Sodium 136  136 - 145 mmol/L    Potassium 3.6  3.5 - 5.5 mmol/L    Chloride 97 (*) 100 - 108 mmol/L    CO2 32  21 - 32 mmol/L    Anion gap 7  3.0 - 18 mmol/L    Glucose 158 (*) 74 - 99 mg/dL    BUN 22 (*) 7.0 - 18 MG/DL    Creatinine 1.61 (*) 0.6 - 1.3 MG/DL    BUN/Creatinine ratio 5 (*) 12 - 20      GFR est AA 21 (*) >60 ml/min/1.17m2    GFR est non-AA 18 (*) >60 ml/min/1.77m2    Calcium 9.3  8.5 - 10.1 MG/DL   LIPASE    Collection Time     11/01/13 11:14 PM       Result Value Range    Lipase 517 (*) 73 - 393 U/L   HEPATIC FUNCTION PANEL    Collection Time     11/01/13 11:14 PM       Result Value Range    Protein, total 9.8 (*) 6.4 - 8.2 g/dL    Albumin 3.0 (*) 3.4 - 5.0 g/dL    Globulin 6.8 (*) 2.0 - 4.0 g/dL    A-G Ratio 0.4 (*) 0.8 - 1.7      Bilirubin, total 0.3  0.2 - 1.0 MG/DL    Bilirubin, direct 0.1  0.0 - 0.2 MG/DL    Alk. phosphatase 163 (*) 45 - 117 U/L    AST 33  15 - 37 U/L    ALT 46  16 - 61 U/L       Radiologist and cardiologist interpretations if available at time of this note:       Medication(s) ordered for patient during this emergency visit encounter:  Medications   vancomycin (VANCOCIN) 1,000 mg in 0.9% sodium chloride (MBP/ADV) 250 mL adv (1,000 mg IntraVENous New Bag 11/01/13 2316)   Gentamicin in Saline (Iso-osm) (GARAMYCIN) 100 mg/100 mL IVPB premix 100 mg (not administered)       Consult Nephrology    The on call nephrologist was called and the patient was presented for nephrology consult. I personally spoke with Dr. Ardelle Park, in the nephrology group, about the patient's presentation and management. Dr. Ardelle Park requests pt admitted for observation, and instructed to give vancomycin and gentamycin x 1 dose each.     Admit to Hospitalist    The patient was presented to the accepting hospitalist, Dr. Virl Cagey.    As  the emergency physician, I wrote courtesy admission orders for the hospitalist physician.  The courtesy orders included explicit instructions for the floor nursing staff to call the admitting attending physician upon patient arrival on the floor.       DIAGNOSIS:  1. GPC positive blood cultures in patient with HD catheter.  2. Epigastric abdominal pain  3. Nausea and vomiting.      SPECIFIC PATIENT INSTRUCTIONS FROM THE PHYSICIAN WHO TREATED YOU IN THE ER TODAY:  1. Return if any concerns or worsening of condition(s).  2. FOLLOW UP APPOINTMENT:  Your primary doctor in 1-2 days.     Arlys John L. Westley Foots, M.D.  ABEM Board Certified Emergency Physician  Scribe Attestation:   November 01, 2013 at 10:22 PM - Janet Berlin scribing for and in the presence of Dr.Chayden Garrelts Bea Laura, MD     Janet Berlin, Scribe    Provider Attestation:  If a scribe was utilized in generation of this patient record, I personally performed the services described in the documentation, reviewed the documentation, as recorded by the scribe in my presence, and it accurately records the patient's history of presenting illness, review of systems, patient physical examination, and procedures performed by me as the attending physician.     Arlys John L. Westley Foots, M.D.  ABEM Board Certified Emergency Physician  11/01/2013.  12:28 AM

## 2013-11-01 NOTE — ED Notes (Signed)
Spoke to medical care team member at [facility] who reports that pt did not receive chemo on Monday as he stated and does not have pancreatic CA as he reported to this RN, states that he was in [place] for court and that he fainted and was taken to [facility] yesterday and that blood cx were positive and called to them tonight. Pt sent to ER for blood culture results, pt was not seen for CP and nausea or ABD pain in the jail as he stated to this RN. Pt has previously reported hx of SSA and was found to be untrue in effort to seek drugs.

## 2013-11-01 NOTE — ED Notes (Signed)
Per MD, pt PICC line may be used to obtain blood and given antibiotics.

## 2013-11-01 NOTE — ED Notes (Signed)
This RN into room to discuss need to obtain to labs from pt and pt states that PICC line is not infected and can be used. Informed pt that he was sent here for infected PICC line( to left chest wall) and that MD states that it should not be used. Expressed need to obtain blood and pt acknowledged and continues to refuse. States that he is ready to leave. MD aware and is currently on phone speaking with pt's dialysis MD.

## 2013-11-02 ENCOUNTER — Inpatient Hospital Stay
Admit: 2013-11-02 | Discharge: 2013-11-05 | Disposition: A | Discharge status: Other Acute Inpatient Hospital | Payer: PRIVATE HEALTH INSURANCE | Attending: Family Medicine | Admitting: Family Medicine

## 2013-11-02 LAB — CBC WITH AUTOMATED DIFF
ABS. BASOPHILS: 0 10*3/uL (ref 0.0–0.06)
ABS. EOSINOPHILS: 0.1 10*3/uL (ref 0.0–0.4)
ABS. LYMPHOCYTES: 0.3 10*3/uL — ABNORMAL LOW (ref 0.8–3.5)
ABS. MONOCYTES: 0.6 10*3/uL (ref 0–1.0)
ABS. NEUTROPHILS: 1.1 10*3/uL — ABNORMAL LOW (ref 1.8–8.0)
BASOPHILS: 0 % (ref 0–3)
EOSINOPHILS: 5 % (ref 0–5)
HCT: 29.4 % — ABNORMAL LOW (ref 36.0–48.0)
HGB: 9.3 g/dL — ABNORMAL LOW (ref 13.0–16.0)
LYMPHOCYTES: 13 % — ABNORMAL LOW (ref 20–51)
MCH: 30 PG (ref 24.0–34.0)
MCHC: 31.6 g/dL (ref 31.0–37.0)
MCV: 94.8 FL (ref 74.0–97.0)
MONOCYTES: 29 % — ABNORMAL HIGH (ref 2–9)
MPV: 9.5 FL (ref 9.2–11.8)
NEUTROPHILS: 53 % (ref 42–75)
PLATELET COMMENTS: DECREASED
PLATELET: 110 10*3/uL — ABNORMAL LOW (ref 135–420)
RBC: 3.1 M/uL — ABNORMAL LOW (ref 4.70–5.50)
RDW: 15.4 % — ABNORMAL HIGH (ref 11.6–14.5)
WBC: 2.1 10*3/uL — ABNORMAL LOW (ref 4.6–13.2)

## 2013-11-02 LAB — HEPATIC FUNCTION PANEL
A-G Ratio: 0.4 — ABNORMAL LOW (ref 0.8–1.7)
ALT (SGPT): 46 U/L (ref 16–61)
AST (SGOT): 33 U/L (ref 15–37)
Albumin: 3 g/dL — ABNORMAL LOW (ref 3.4–5.0)
Alk. phosphatase: 163 U/L — ABNORMAL HIGH (ref 45–117)
Bilirubin, direct: 0.1 MG/DL (ref 0.0–0.2)
Bilirubin, total: 0.3 MG/DL (ref 0.2–1.0)
Globulin: 6.8 g/dL — ABNORMAL HIGH (ref 2.0–4.0)
Protein, total: 9.8 g/dL — ABNORMAL HIGH (ref 6.4–8.2)

## 2013-11-02 LAB — METABOLIC PANEL, BASIC
Anion gap: 7 mmol/L (ref 3.0–18)
BUN/Creatinine ratio: 5 — ABNORMAL LOW (ref 12–20)
BUN: 22 MG/DL — ABNORMAL HIGH (ref 7.0–18)
CO2: 32 mmol/L (ref 21–32)
Calcium: 9.3 MG/DL (ref 8.5–10.1)
Chloride: 97 mmol/L — ABNORMAL LOW (ref 100–108)
Creatinine: 4.16 MG/DL — ABNORMAL HIGH (ref 0.6–1.3)
GFR est AA: 21 mL/min/{1.73_m2} — ABNORMAL LOW (ref >60)
GFR est non-AA: 18 mL/min/{1.73_m2} — ABNORMAL LOW (ref >60)
Glucose: 158 mg/dL — ABNORMAL HIGH (ref 74–99)
Potassium: 3.6 mmol/L (ref 3.5–5.5)
Sodium: 136 mmol/L (ref 136–145)

## 2013-11-02 LAB — LIPASE
Lipase: 517 U/L — ABNORMAL HIGH (ref 73–393)
Lipase: 381 U/L (ref 73–393)

## 2013-11-02 MED ADMIN — vancomycin (VANCOCIN) 1,000 mg in 0.9% sodium chloride (MBP/ADV) 250 mL adv: INTRAVENOUS | @ 04:00:00 | NDC 00409710102

## 2013-11-02 MED ADMIN — calcium acetate (PHOSLO) capsule 667 mg: ORAL | @ 14:00:00 | NDC 68084047911

## 2013-11-02 MED ADMIN — piperacillin-tazobactam (ZOSYN) 2.25 g in 0.9% sodium chloride (MBP/ADV) 50 mL MBP: INTRAVENOUS | @ 08:00:00 | NDC 25021016430

## 2013-11-02 MED ADMIN — hydrALAZINE (APRESOLINE) tablet 25 mg: ORAL | @ 07:00:00 | NDC 62584073311

## 2013-11-02 MED ADMIN — piperacillin-tazobactam (ZOSYN) 2.25 g in 0.9% sodium chloride (MBP/ADV) 50 mL MBP: INTRAVENOUS | @ 22:00:00 | NDC 25021016430

## 2013-11-02 MED ADMIN — traMADol (ULTRAM) tablet 50 mg: ORAL | @ 08:00:00 | NDC 51079099101

## 2013-11-02 MED ADMIN — Gentamicin in Saline (Iso-osm) (GARAMYCIN) 100 mg/100 mL IVPB premix 100 mg: INTRAVENOUS | @ 06:00:00 | NDC 00338050548

## 2013-11-02 MED ADMIN — atenolol (TENORMIN) tablet 50 mg: ORAL | @ 15:00:00 | NDC 62584046711

## 2013-11-02 MED ADMIN — calcium acetate (PHOSLO) capsule 667 mg: ORAL | @ 22:00:00 | NDC 68084047911

## 2013-11-02 MED ADMIN — VANCOMYCIN INFORMATION NOTE: @ 09:00:00 | NDC 08888888890

## 2013-11-02 MED ADMIN — ondansetron (ZOFRAN) injection 4 mg: INTRAVENOUS | @ 08:00:00 | NDC 00409475503

## 2013-11-02 MED ADMIN — calcium acetate (PHOSLO) capsule 667 mg: ORAL | @ 18:00:00 | NDC 68084047911

## 2013-11-02 MED ADMIN — piperacillin-tazobactam (ZOSYN) 2.25 g in 0.9% sodium chloride (MBP/ADV) 50 mL MBP: INTRAVENOUS | @ 14:00:00 | NDC 25021016430

## 2013-11-02 NOTE — Progress Notes (Signed)
Admitted with Positive blood Culture,strongly suspect his (L) IJ catheter is the source & that needs to come out, Will dialyze on q Mon, Wed & Friday.

## 2013-11-02 NOTE — Progress Notes (Signed)
SUBJECTIVE:    Patient has left IJ PICC line in placed x 6-9 months per patient and it was placed in NC per patient.  He denies any chest pain or SOB or nausea or vomiitng.  Some epigastric discomfort.     OBJECTIVE:    BP 124/68   Pulse 76   Temp(Src) 98 ??F (36.7 ??C)   Resp 20   Ht 5\' 5"  (1.651 m)   Wt 81.647 kg (180 lb)   BMI 29.95 kg/m2   SpO2 96%  CTA  RRR  NT, BS +  No pedal edema  NAD    A/P:    1. Positive blood culture reported @ [facility]: Will get a report on Monday morning. cont  vancomycin and Zosyn. Repeat one set from PICC and another one from peripheral.  Remove central PICC once location is identified as floor nurses may not be able to pull it out if it is a central line.   2. End-stage renal disease on hemodialysis Tuesdays, Thursdays and   Saturdays.  3. Hypertension. Cont current dose of Hydralazine, Atenolol and clonidine.   4. Elevated lipase - resolved.  No clinical s/o pancreatitis.  5. HIV - Not on any medication.  Obtain a medication list.  Talked to nursing about it.       CMP:   Lab Results   Component Value Date/Time    NA 136 11/01/2013 11:14 PM    K 3.6 11/01/2013 11:14 PM    CL 97* 11/01/2013 11:14 PM    CO2 32 11/01/2013 11:14 PM    AGAP 7 11/01/2013 11:14 PM    GLU 158* 11/01/2013 11:14 PM    BUN 22* 11/01/2013 11:14 PM    CREA 4.16* 11/01/2013 11:14 PM    GFRAA 21* 11/01/2013 11:14 PM    GFRNA 18* 11/01/2013 11:14 PM    CA 9.3 11/01/2013 11:14 PM    TP 9.8* 11/01/2013 11:14 PM    ALB 3.0* 11/01/2013 11:14 PM    GLOB 6.8* 11/01/2013 11:14 PM    AGRAT 0.4* 11/01/2013 11:14 PM    SGOT 33 11/01/2013 11:14 PM    ALT 46 11/01/2013 11:14 PM     CBC:   Lab Results   Component Value Date/Time    WBC 2.1* 11/01/2013 11:14 PM    HGB 9.3* 11/01/2013 11:14 PM    HCT 29.4* 11/01/2013 11:14 PM    PLT 110* 11/01/2013 11:14 PM

## 2013-11-02 NOTE — Other (Signed)
Bedside and Verbal shift change report given to Barbara Barner LPN (oncoming nurse) by Rasheen RN (offgoing nurse). Report included the following information SBAR, Kardex, Intake/Output, MAR and Recent Results.

## 2013-11-02 NOTE — Other (Signed)
TRANSFER - OUT REPORT:    Verbal report given to Rasheem Rawlings RN(name) on [employer]  being transferred to medical(unit) for routine progression of care       Report consisted of patient???s Situation, Background, Assessment and   Recommendations(SBAR).     Information from the following report(s) SBAR, ED Summary, [facility] and Recent Results was reviewed with the receiving nurse.    Opportunity for questions and clarification was provided.

## 2013-11-02 NOTE — Other (Signed)
TRANSFER - IN REPORT:    Verbal report received from Kristi Adams RN(name) on [employer]  being received from ED(unit) for routine progression of care      Report consisted of patient???s Situation, Background, Assessment and   Recommendations(SBAR).     Information from the following report(s) SBAR, Kardex, Intake/Output, MAR and Recent Results was reviewed with the receiving nurse.    Opportunity for questions and clarification was provided.      Assessment completed upon patient???s arrival to unit and care assumed.

## 2013-11-02 NOTE — Other (Signed)
Bedside and Verbal shift change report given to [facility] RN (oncoming nurse) by B.Aalaysia Liggins LPN (offgoing nurse). Report included the following information Kardex, Intake/Output, MAR and Recent Results.

## 2013-11-02 NOTE — Consults (Signed)
[facility]                               [address] [place], [place] [address]                                CONSULTATION REPORT    PATIENT:    Tyler Ballard, Tyler Ballard  MRN:            [id number]    ADMIT DATE:   11/01/2013  BILLING:        [id number] CONSULTED:    11/01/2013  ROOM:       [id number]  ATTENDING:  Eartha Inch, MD  DICTATING:  Timothy Lasso, MD        RENAL CONSULTATION    REQUESTING PHYSICIAN: Suzanna Obey, MD, from the emergency room    HISTORY OF PRESENT ILLNESS: This is a 32 year old African American male who  was [facility] with a report  of positive blood culture, gram-positive cocci. He has history an ESRD from  hypertension for a long period of time, at least 3 to 4 years, and  currently gets dialysis at the [facility]. He usually gets  dialysis every Monday, Wednesday, Friday. 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 He does not have any history of any diabetes  mellitus. No history of any NSAID use, possibly has history of drug-related  problems in the past. He denies any fever, any chills, any nausea, any  vomiting, any chest pain, any shortness of breath. He has not received  kidney transplant, but he is thinking to have it. But it is worth to  mention that [facility] kidney transplant center does not do  any HIV positive patients  a kidney transplant ,usually [facility], [facility] does kidney transplant in HIV patient.. He still  makes a little amount of urine. It is worth mentioning that he does have  poor IV access. For some reason, he has a central PICC line on the left IJ  that has been done in [place] at least 7 to 8 months back. He was  discussed with different doctors and all the doctors advised him to take it  out. We are not using that central line for a long period of time, but he  adamantly refused to take it out.  Now the blood culture is positive, I am  not sure from where it was drawn, but I strongly suspect that may be the  source of infection.    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    PAST SURGICAL HISTORY: Right hip decompression, also the fistula of the  right arm.    FAMILY HISTORY: Hypertension, sickle cell anemia, cancer in mother. What  kind of cancer is not clear.    SOCIAL HISTORY: He is single. He smokes at least 1/4 to 1/2 pack of  cigarettes per day. No history of any drug abuse, as per the chart, but I  am not sure.    ALLERGIES:  NO KNOWN DRUG ALLERGIES.    LIST OF MEDICATIONS BEFORE ADMISSION  1. Atenolol 50 mg p.o. daily.  2. PhosLo 667 mg capsule 3 capsules 3 times daily.  3. Clonidine 0.3 mg 3 times daily.  4. Hectorol 4 mcg IV on each dialysis treatment.  5. Epogen 10,000 units IV on each dialysis treatment.  6. Hydralazine 50 mg 1 tablet 3 times daily.  7. Lisinopril 10 mg p.o. daily.  8. Tramadol 50 mg 1 every 6 hours p.r.n. for pain.  9. Besides that, he is on other anti-retroviral infection related medicine  which are not mentioned here and at least not available.    PHYSICAL EXAMINATION  VITAL SIGNS: Temperature 98.3, blood pressure 127/67, 117/57, 113/57, this  morning is 124/68, respirations 20, oxygen saturation of 96%, heart rate  varying between 58-82/minute. His weight is 81.6 kilograms and his dry  weight is 76.5 kilograms.  HEENT: Exam is unremarkable. No oral thrush.  NECK: Supple, no tenderness in the spines. He left-sided IJ, possibly  central PICC line in the IJ or the left subclavian He left-sided IJ,  possibly central PICC line either in the IJ or the left subclavian vein,  mild erythema, without any significant tenderness, no drainage.  LUNGS: Good air entry in both sides.  HEART: S1, S2, without any gallop or murmur.  ABDOMEN:  Soft. No palpable mass.  EXTREMITIES: Ankle negative edema. No calf muscle tenderness. Has AV  fistula on the right arm, has a good thrill and bruit.    LABORATORY DATA: Relevant labs that were done on admission, WBC of 2.1 with  a hemoglobin of 9.3, hematocrit of 29.4, platelets of 110,000 with  neutrophils 53%, lymphocytes 13%, monocytes 29%, eosinophils 5%. Chemistry  that was done last night, sodium 136, potassium 3.6, chloride 97, CO2 of  32, glucose 158, blood urea nitrogen of 22 with a creatinine of 4.6,  calcium of 9.3. Total protein of 9.8, with albumin of 3.0. Lipase is 381.  PTH in September of this year was 1679 mcg per mL, which is quite high. A  repeat PTH also ordered. Chest x-ray was done and shows patchy left basilar  consolidation acutely compatible with infiltrate.    IMPRESSION AND PLAN: Sepsis with a positive blood culture. The patient was  given a gram of vancomycin and 100 mg of gentamicin yesterday on admission  in the emergency room as per my advice, we have to follow the blood  culture, one blood culture has also already being done from the catheter  site. More blood cultures need to be done. That left-sided central  peripheral IV line has to come out. If the blood culture remains positive,  then I would recommend to do a transesophageal echocardiogram to make sure  that we do not miss any endocarditis, but if the blood culture is negative,  possibly we do not need any echo. We will need to continue the antibiotic  at least 2 weeks if the blood cultures remain negative, but if it is  positive, will need more and possibly 6 weeks of antibiotics. We need to restart all his antiretroviral  or HIV medications, but this could be obtained from the correctional  center. He will be maintained on his regular dialysis schedule of Monday,  Wednesday, Friday. We will give the appropriate Epogen Hectorol dose. His  blood pressure is fluctuating on the lower side now and the blood pressure  medication  should be adjusted as much as he can tolerate. His  albumin is on  the lower side. We are going to supplement Nepro to improve albumin.  Further recommendations will be given based on the hospital course and  again the chest x-ray shows left lower lobe of infiltrate, whether this is  new or old is not clear, but the antibiotic also should be covered. Further  recommendations will be given based on the hospital course.                     Timothy Lasso, MD    [facility]:wmx  D: 11/02/2013 T: 11/02/2013 04:00 P  Job: [id number]  CScriptDoc #: [id number]  cc:   Timothy Lasso, MD        Eartha Inch, MD

## 2013-11-02 NOTE — H&P (Signed)
[facility]                               [address] [place], [place] [address]                               HISTORY AND PHYSICAL    PATIENT:      Tyler Ballard, Tyler Ballard  MRN:             [id number]      DATE:   11/01/2013  BILLING:         [id number]   DOB:    1981/10/04  ROOM:         [id number]  REFERRING:  DICTATING:    Eartha Inch, MD        The patient does not have a primary care doctor. He sees Dr. Ardelle Park, renal  doctor.    HISTORY OF PRESENTING COMPLAINT: The patient was sent to the hospital for  evaluation of positive blood cultures. The patient also complained of  abdominal pain and nausea.    HISTORY OF PRESENT ILLNESS: The patient is a poor historian. This is a  32 year old African American male with history of end-stage renal disease  on hemodialysis Tuesday, Thursday, and Saturdays. History of uncontrolled  blood pressure, who was seen at [facility] yesterday after he was sent over there  from the [facility] for decreased responsiveness. He did have chest  x-ray, blood cultures, looked okay. He did have blood cultures drawn and  was sent home after that because it got better. He was found to have  elevated blood pressure yesterday over 200 on admission at [facility]. Chart was  also reviewed. They were called today because the blood culture was  positive in 2 bottles. We called Dr. Ardelle Park who wanted the patient admitted,  started on vancomycin and gentamicin, pharmacy to dose. The patient has  been seen and evaluated at the bedside in ED. Complaining of abdominal  pain. Worried about his pancreatitis getting worse. Denies any chest pain.  Denies any diarrhea. No headache. No paroxysmal nocturnal dyspnea. No  orthopnea. No joint pain or joint stiffness. Blood culture was positive for  gram-positive cocci in clusters. He has a central line in place in the  subclavian, which he said he has had since 05/2012 which probably needs to  come out.  He is being admitted to the hospital for further management.    REVIEW OF SYSTEMS: Discussed in the body of the history.    PAST MEDICAL HISTORY:  1. End-stage renal disease.  2. Hypertension.  3. Chronic anemia.  4. HIV from the medical record.    HOME MEDICATIONS:  1. Epogen shot with dialysis.  2. Hectorol 2 mcg IV injection during dialysis.  3. Tramadol 50 mg every 12 hours.  4. PhosLo 667 3 times a day with meals.  5. Clonidine 0.3 mg 3 times a day.  6. Lisinopril 10 mg daily.  7. Atenolol 50 mg daily.  8. Hydralazine 100 mg 3 times a day.    PAST SURGICAL HISTORY: Hip surgery, multiple AV fistulas.    FAMILY HISTORY: Sickle cell disease, hypertension.    SOCIAL HISTORY: Smokes a pack a day smoking  for about 10 years. Drinks  alcohol socially.    MEDICATION GIVEN IN THE EMERGENCY DEPARTMENT: Vancomycin and gentamicin,  pharmacy to dose.    PHYSICAL EXAMINATION:  VITAL SIGNS: Temperature 98.1, heart rate of 70 beats per minute,  respirations 120/60. Weight is 180 pounds, oxygen saturation 96% on room  air.  GENERAL: Awake, alert, afebrile, not pale, not in any obvious distress.  HEAD: Normocephalic, atraumatic.  ENT: Pupils reactive to accommodation bilaterally. Extraocular movements  intact.  NECK: Supple. Normal range of motion in the neck.  CHEST: Clear to auscultation.  CARDIOVASCULAR: Heart sounds, murmur, gallop, no rales. He does have a left  central line in place.  ABDOMEN: Soft, nontender. Bowel sounds normoactive.  EXTREMITIES: Does have a right AV fistula with good bruit.  EXTREMITIES: No clubbing, no cyanosis, no edema. He does have very dry  scaly skin and bilaterally.    LABORATORY DATA: White count of 2100, hemoglobin 9.3, hematocrit 29.4,  platelets 110,000. Sodium 137, potassium is 3.6, chloride of 97,  bicarbonate 32, BUN 22, creatinine is 4.16, lipase is 517, alkaline  phosphatase of 103. Blood culture drawn and result is pending.    ASSESSMENT AND PLAN:  1. Positive blood culture of broad  spectrum infection. The patient is  asymptomatic at the moment. Probably from infected central line that has to  come out on inpatient medical floor. Start him on IV antibiotics with  vancomycin and Zosyn.  2. End-stage renal disease on hemodialysis Tuesdays, Thursdays and  Saturdays. Renal consult Dr. Ardelle Park will see the patient in the morning, he  has been consulted.  3. Hypertension. Restart Hydralazine, Atenolol and clonidine.  4. Elevated lipase, possible pancreatitis, etiology is unknown. We will  make him n.p.o. for now. Pain management with Ultram.  5. Deep venous thrombosis prophylaxis with heparin subcutaneous.  6. Thrombocytopenia. Platelet count is 110,000. Monitor closely.                     Eartha Inch, MD    [facility]:wmx  D: 11/02/2013  01:17 A  T: 11/02/2013  03:00 A  Job: [id number]  CScriptDoc #: [id number]  cc:   Eartha Inch, MD

## 2013-11-02 NOTE — Progress Notes (Signed)
Pt refused to have 4 eye skin assessment done.  Pt educated on why it should be done, but pt still refused.

## 2013-11-03 LAB — PTH INTACT
Calcium: 8.6 MG/DL (ref 8.5–10.1)
PTH, Intact: 967.8 pg/mL — ABNORMAL HIGH (ref 14.0–72.0)

## 2013-11-03 LAB — PHOSPHORUS: Phosphorus: 7.4 MG/DL — ABNORMAL HIGH (ref 2.5–4.9)

## 2013-11-03 LAB — METABOLIC PANEL, BASIC
Anion gap: 9 mmol/L (ref 3.0–18)
BUN/Creatinine ratio: 6 — ABNORMAL LOW (ref 12–20)
BUN: 40 MG/DL — ABNORMAL HIGH (ref 7.0–18)
CO2: 28 mmol/L (ref 21–32)
Calcium: 8.6 MG/DL (ref 8.5–10.1)
Chloride: 100 mmol/L (ref 100–108)
Creatinine: 6.2 MG/DL — ABNORMAL HIGH (ref 0.6–1.3)
GFR est AA: 14 mL/min/{1.73_m2} — ABNORMAL LOW (ref >60)
GFR est non-AA: 11 mL/min/{1.73_m2} — ABNORMAL LOW (ref >60)
Glucose: 89 mg/dL (ref 74–99)
Potassium: 4.3 mmol/L (ref 3.5–5.5)
Sodium: 137 mmol/L (ref 136–145)

## 2013-11-03 LAB — CBC WITH AUTOMATED DIFF
ABS. BASOPHILS: 0 10*3/uL (ref 0.0–0.06)
ABS. EOSINOPHILS: 0.1 10*3/uL (ref 0.0–0.4)
ABS. LYMPHOCYTES: 0.4 10*3/uL — ABNORMAL LOW (ref 0.8–3.5)
ABS. MONOCYTES: 0.6 10*3/uL (ref 0–1.0)
ABS. NEUTROPHILS: 1.3 10*3/uL — ABNORMAL LOW (ref 1.8–8.0)
BAND NEUTROPHILS: 2 % (ref 0–5)
BASOPHILS: 0 % (ref 0–3)
EOSINOPHILS: 5 % (ref 0–5)
HCT: 27.7 % — ABNORMAL LOW (ref 36.0–48.0)
HGB: 8.9 g/dL — ABNORMAL LOW (ref 13.0–16.0)
LYMPHOCYTES: 15 % — ABNORMAL LOW (ref 20–51)
MCH: 30.2 PG (ref 24.0–34.0)
MCHC: 32.1 g/dL (ref 31.0–37.0)
MCV: 93.9 FL (ref 74.0–97.0)
MONOCYTES: 23 % — ABNORMAL HIGH (ref 2–9)
MPV: 10.5 FL (ref 9.2–11.8)
NEUTROPHILS: 55 % (ref 42–75)
PLATELET COMMENTS: DECREASED
PLATELET: 119 10*3/uL — ABNORMAL LOW (ref 135–420)
RBC: 2.95 M/uL — ABNORMAL LOW (ref 4.70–5.50)
RDW: 15.3 % — ABNORMAL HIGH (ref 11.6–14.5)
WBC: 2.4 10*3/uL — ABNORMAL LOW (ref 4.6–13.2)

## 2013-11-03 MED ADMIN — piperacillin-tazobactam (ZOSYN) 2.25 g in 0.9% sodium chloride (MBP/ADV) 50 mL MBP: INTRAVENOUS | @ 23:00:00 | NDC 25021016430

## 2013-11-03 MED ADMIN — cloNIDine HCl (CATAPRES) tablet 0.1 mg: ORAL | @ 13:00:00 | NDC 62584065711

## 2013-11-03 MED ADMIN — piperacillin-tazobactam (ZOSYN) 2.25 g in 0.9% sodium chloride (MBP/ADV) 50 mL MBP: INTRAVENOUS | @ 08:00:00 | NDC 25021016430

## 2013-11-03 MED ADMIN — piperacillin-tazobactam (ZOSYN) 2.25 g in 0.9% sodium chloride (MBP/ADV) 50 mL MBP: INTRAVENOUS | @ 16:00:00 | NDC 25021016430

## 2013-11-03 MED ADMIN — famotidine (PEPCID) tablet 20 mg: ORAL | @ 21:00:00 | NDC 00172572880

## 2013-11-03 MED ADMIN — traMADol (ULTRAM) tablet 50 mg: ORAL | @ 16:00:00 | NDC 51079099101

## 2013-11-03 MED ADMIN — cloNIDine HCl (CATAPRES) tablet 0.1 mg: ORAL | @ 23:00:00 | NDC 62584065711

## 2013-11-03 MED ADMIN — calcium acetate (PHOSLO) capsule 667 mg: ORAL | @ 13:00:00 | NDC 68084047911

## 2013-11-03 MED ADMIN — calcium acetate (PHOSLO) capsule 667 mg: ORAL | @ 23:00:00 | NDC 68084047911

## 2013-11-03 MED ADMIN — hydrALAZINE (APRESOLINE) tablet 25 mg: ORAL | @ 21:00:00 | NDC 62584073311

## 2013-11-03 MED ADMIN — calcium acetate (PHOSLO) capsule 667 mg: ORAL | @ 16:00:00 | NDC 68084047911

## 2013-11-03 NOTE — Progress Notes (Signed)
Bedside and Verbal shift change report given to Kathy Neild RN (oncoming nurse) by Tracy Holloman LPN (offgoing nurse). Report included the following information SBAR, Kardex and MAR.

## 2013-11-03 NOTE — Progress Notes (Signed)
Chaplain conducted an initial consultation and Spiritual Assessment for Tyler Ballard, who is a 32 y.o.,male. Patient???s Primary Language is: [place].   According to the patient???s EMR Religious Affiliation is: Non denominational.     The reason the Patient came to the hospital is:   Patient Active Problem List    Diagnosis Date Noted   ??? Clotted dialysis access 11/03/2013   ??? Dialysis patient 11/03/2013   ??? Secondary hyperparathyroidism 11/03/2013   ??? Positive blood culture 11/02/2013   ??? Pulmonary edema 09/06/2013   ??? Drug-seeking behavior 09/06/2013   ??? Noncompliance of patient with renal dialysis 09/06/2013   ??? Pancytopenia 09/05/2013   ??? Nausea & vomiting 09/04/2013   ??? Malingering 08/06/2013   ??? Do not give narcotics 08/06/2013   ??? ARF (acute renal failure) 08/02/2013   ??? Sickle cell crisis 05/26/2013     Class: Acute   ??? Arthralgia 03/29/2013   ??? Persistent vomiting 03/29/2013   ??? ESRD (end stage renal disease) on dialysis 07/06/2012   ??? HTN (hypertension) 07/06/2012   ??? Sickle cell anemia with pain 07/05/2012        The Chaplain provided the following Interventions:  Initiated a relationship of care and support.   Explored issues of faith, belief, spirituality and religious/ritual needs while hospitalized.  Listened empathically.  Provided chaplaincy education.  Provided information about Spiritual Care Services.  Offered prayer and assurance of continued prayers on patient's behalf.   Chart reviewed.    The following outcomes were achieved:  Patient shared limited information about both their medical narrative and spiritual journey/beliefs.  Patient processed feeling about current hospitalization.  Patient expressed gratitude for the Chaplain's visit.    Assessment:  Patient does not have any religious/cultural needs that will affect patient???s preferences in health care.        Plan:  Chaplains will continue to follow and will provide pastoral care on an as needed/requested basis.  Chaplain recommends  bedside caregivers page chaplain on duty if patient shows signs of acute spiritual or emotional distress.    Chaplain [facility]

## 2013-11-03 NOTE — Other (Signed)
Bedside and Verbal shift change report given to Tracy Holloman LPN (oncoming nurse) by Rasheen Rawlings RN (offgoing nurse). Report included the following information SBAR, Kardex, Intake/Output, MAR and Recent Results.

## 2013-11-03 NOTE — Progress Notes (Addendum)
Received on rounds lying in bed. A/OX3. (Inmate) Guard at bedside. Right ankle cuffed to bed. Denies pain. No resp. distress noted. Uses a cane for ambulation. Dialysis pt., skin dry. Unable to hear/feel bruitt/thrill to left arm fistula.    11/03/13 1015 Retook pt's B/P 108/63, pulse 72. Held Atenolol and Hydralazine. Dr. Allena Katz (Hospitalist) aware.    11/03/2013 1032 Ultram 50mg  1 po. C/O Abd. pain. 5/10    11/03/2013 1130 Reassessed pt's pain scale 4/10.    11/03/2013 1305 Obtained consent for thrombectomy/revison right arm dialysis access.

## 2013-11-03 NOTE — Progress Notes (Signed)
Progress Note    Tyler Ballard  32 y.o.      Admit Date: 11/01/2013  Patient Active Problem List   Diagnosis Code   ??? Sickle cell anemia with pain 282.62   ??? ESRD (end stage renal disease) on dialysis 585.6   ??? HTN (hypertension) 401.9   ??? Arthralgia 719.40   ??? Persistent vomiting 536.2   ??? Sickle cell crisis 282.62   ??? Nausea & vomiting 787.01   ??? Pancytopenia 284.19   ??? ARF (acute renal failure) 584.9   ??? Malingering V65.2   ??? Do not give narcotics V49.89   ??? Pulmonary edema 514   ??? Drug-seeking behavior 305.90   ??? Noncompliance of patient with renal dialysis V45.12   ??? Positive blood culture 790.7   ??? Clotted dialysis access 996.1   ??? Dialysis patient V45.11   ??? Secondary hyperparathyroidism 588.81           Subjective:     Patient feels good, no complain, no SOB. AVG  is clotted, has positive Blood culture in Hospital.      A comprehensive review of systems was negative except for that written in the History of Present Illness.    Objective:     BP 150/72   Pulse 63   Temp(Src) 98.1 ??F (36.7 ??C)   Resp 18   Ht 5\' 5"  (1.651 m)   Wt 81.647 kg (180 lb)   BMI 29.95 kg/m2   SpO2 99%      Intake/Output Summary (Last 24 hours) at 11/03/13 1336  Last data filed at 11/03/13 1123   Gross per 24 hour   Intake   1490 ml   Output      0 ml   Net   1490 ml       Current Facility-Administered Medications   Medication Dose Route Frequency Provider Last Rate Last Dose   ??? atenolol (TENORMIN) tablet 50 mg  50 mg Oral DAILY [place]   50 mg at 11/02/13 1610   ??? calcium acetate (PHOSLO) capsule 667 mg  1 capsule Oral TID WITH MEALS [place]   667 mg at 11/03/13 1127   ??? traMADol (ULTRAM) tablet 50 mg  50 mg Oral Q6H PRN [place]   50 mg at 11/03/13 1032   ??? acetaminophen (TYLENOL) tablet 650 mg  650 mg Oral Q4H PRN [place]       ??? ondansetron (ZOFRAN) injection 4 mg  4 mg IntraVENous Q8H PRN [place]   4 mg at 11/02/13 0300   ??? heparin (porcine) injection 5,000 Units  5,000 Units  SubCUTAneous Q8H [place]       ??? piperacillin-tazobactam (ZOSYN) 2.25 g in 0.9% sodium chloride (MBP/ADV) 50 mL MBP  2.25 g IntraVENous Q8H [place] 100 mL/hr at 11/03/13 1123 2.25 g at 11/03/13 1123   ??? VANCOMYCIN INFORMATION NOTE   Other CONTINUOUS [place] [place]       ??? hydrALAZINE (APRESOLINE) tablet 25 mg  25 mg Oral TID Evalee Jefferson, MD   25 mg at 11/02/13 0154   ??? cloNIDine HCl (CATAPRES) tablet 0.1 mg  0.1 mg Oral BID Evalee Jefferson, MD   0.1 mg at 11/03/13 0824   ??? [START ON 11/04/2013] epoetin alfa (EPOGEN;PROCRIT) injection 10,000 Units  10,000 Units IntraVENous DIALYSIS MON, WED & FRI Timothy Lasso, MD       ??? [START ON 11/04/2013] doxercalciferol (HECTOROL) 4 mcg/2  mL injection 1 mcg  1 mcg IntraVENous DIALYSIS MON, WED & FRI Timothy Lasso, MD            Physical Exam:     Physical Exam:   General:  Alert, cooperative, no distress, appears stated age.   Neck: Supple, symmetrical, trachea midline, no adenopathy, thyroid: no enlargement/tenderness/nodules, no carotid bruit and no JVD.   Lungs:   Clear to auscultation bilaterally.   Heart:  Regular rate and rhythm, S1, S2 normal, no murmur, click, rub or gallop.   Abdomen:   Soft, non-tender. Bowel sounds normal. No masses,  No organomegaly.   Extremities: Extremities normal, atraumatic, no cyanosis or edema, AVG is clotted, no thrill  & bruit.         Data Review:    CBC w/Diff    Recent Labs      11/03/13   0148  11/01/13   2314   WBC  2.4*  2.1*   RBC  2.95*  3.10*   HGB  8.9*  9.3*   HCT  27.7*  29.4*   MCV  93.9  94.8   MCH  30.2  30.0   MCHC  32.1  31.6   RDW  15.3*  15.4*    Recent Labs      11/03/13   0148  11/01/13   2314   BANDS  2   --    MONOS  23*  29*   EOS  5  5   BASOS  0  0   RDW  15.3*  15.4*        Comprehensive Metabolic Profile    Recent Labs      11/03/13   0148  11/01/13   2314   NA  137  136   K  4.3  3.6   CL  100  97*   CO2  28  32   BUN  40*  22*   CREA  6.20*  4.16*    Recent Labs      11/03/13   0400  11/03/13    0148  11/01/13   2314   CA  8.6  8.6  9.3   PHOS   --   7.4*   --    ALB   --    --   3.0*   TP   --    --   9.8*   SGOT   --    --   33   TBILI   --    --   0.3                      Impression:       Active Hospital Problems    Diagnosis Date Noted   ??? Clotted dialysis access 11/03/2013   ??? Dialysis patient 11/03/2013   ??? Secondary hyperparathyroidism 11/03/2013   ??? Positive blood culture 11/02/2013            Plan:     Needs Declotting of AVG, discussed with Dr. Thea Silversmith , he agreed to to Declot the AVG tomorrow & also take out the (L) IJ central PICC line.Continue Vancomycin. Will dialyze tomorrow after Declotting of AVG.      Timothy Lasso, MD

## 2013-11-03 NOTE — Progress Notes (Signed)
SUBJECTIVE:    Had a BM. He denies any chest pain or SOB or nausea or vomiitng.  Some epigastric discomfort.     OBJECTIVE:    BP 150/72   Pulse 63   Temp(Src) 98.1 ??F (36.7 ??C)   Resp 18   Ht 5\' 5"  (1.651 m)   Wt 81.647 kg (180 lb)   BMI 29.95 kg/m2   SpO2 99%  CTA, RIGHT central PICC.   RRR  NT, BS +  No pedal edema  NAD    A/P:    1. Positive blood culture reported @ [facility]:  Cont vancomycin and Zosyn. As per dr. Ardelle Park: dr. Ronne Binning will remove central PICC and de-clot AV fistula in am.    2. End-stage renal disease on hemodialysis Tuesdays, Thursdays and Saturdays.  3. Hypertension. Cont current dose of Hydralazine, Atenolol and clonidine.   4. Elevated lipase - resolved.  No clinical s/o pancreatitis.  5. HIV - Not on any medication per [facility] medication list.        CMP:   Lab Results   Component Value Date/Time    NA 137 11/03/2013  1:48 AM    K 4.3 11/03/2013  1:48 AM    CL 100 11/03/2013  1:48 AM    CO2 28 11/03/2013  1:48 AM    AGAP 9 11/03/2013  1:48 AM    GLU 89 11/03/2013  1:48 AM    BUN 40* 11/03/2013  1:48 AM    CREA 6.20* 11/03/2013  1:48 AM    GFRAA 14* 11/03/2013  1:48 AM    GFRNA 11* 11/03/2013  1:48 AM    CA 8.6 11/03/2013  4:00 AM    PHOS 7.4* 11/03/2013  1:48 AM     CBC:   Lab Results   Component Value Date/Time    WBC 2.4* 11/03/2013  1:48 AM    HGB 8.9* 11/03/2013  1:48 AM    HCT 27.7* 11/03/2013  1:48 AM    PLT 119* 11/03/2013  1:48 AM

## 2013-11-04 LAB — CBC WITH AUTOMATED DIFF
ABS. BASOPHILS: 0 10*3/uL (ref 0.0–0.1)
ABS. EOSINOPHILS: 0.2 10*3/uL (ref 0.0–0.4)
ABS. LYMPHOCYTES: 0.8 10*3/uL — ABNORMAL LOW (ref 0.9–3.6)
ABS. MONOCYTES: 0.4 10*3/uL (ref 0.05–1.2)
ABS. NEUTROPHILS: 1.2 10*3/uL — ABNORMAL LOW (ref 1.8–8.0)
BASOPHILS: 1 % (ref 0–2)
EOSINOPHILS: 6 % — ABNORMAL HIGH (ref 0–5)
HCT: 27 % — ABNORMAL LOW (ref 36.0–48.0)
HGB: 9 g/dL — ABNORMAL LOW (ref 13.0–16.0)
LYMPHOCYTES: 30 % (ref 21–52)
MCH: 30.6 PG (ref 24.0–34.0)
MCHC: 33.3 g/dL (ref 31.0–37.0)
MCV: 91.8 FL (ref 74.0–97.0)
MONOCYTES: 17 % — ABNORMAL HIGH (ref 3–10)
MPV: 10.4 FL (ref 9.2–11.8)
NEUTROPHILS: 46 % (ref 40–73)
PLATELET: 123 10*3/uL — ABNORMAL LOW (ref 135–420)
RBC: 2.94 M/uL — ABNORMAL LOW (ref 4.70–5.50)
RDW: 14.9 % — ABNORMAL HIGH (ref 11.6–14.5)
WBC: 2.6 10*3/uL — ABNORMAL LOW (ref 4.6–13.2)

## 2013-11-04 LAB — METABOLIC PANEL, BASIC
Anion gap: 11 mmol/L (ref 3.0–18)
BUN/Creatinine ratio: 8 — ABNORMAL LOW (ref 12–20)
BUN: 62 MG/DL — ABNORMAL HIGH (ref 7.0–18)
CO2: 25 mmol/L (ref 21–32)
Calcium: 8.4 MG/DL — ABNORMAL LOW (ref 8.5–10.1)
Chloride: 99 mmol/L — ABNORMAL LOW (ref 100–108)
Creatinine: 7.97 MG/DL — ABNORMAL HIGH (ref 0.6–1.3)
GFR est AA: 10 mL/min/{1.73_m2} — ABNORMAL LOW (ref >60)
GFR est non-AA: 8 mL/min/{1.73_m2} — ABNORMAL LOW (ref >60)
Glucose: 97 mg/dL (ref 74–99)
Potassium: 4.8 mmol/L (ref 3.5–5.5)
Sodium: 135 mmol/L — ABNORMAL LOW (ref 136–145)

## 2013-11-04 LAB — VANCOMYCIN, RANDOM: Vancomycin, random: 12.2 UG/ML (ref 5.0–40)

## 2013-11-04 LAB — GLUCOSE, POC: Glucose (POC): 106 mg/dL (ref 70–110)

## 2013-11-04 LAB — PHOSPHORUS: Phosphorus: 8.2 MG/DL — ABNORMAL HIGH (ref 2.5–4.9)

## 2013-11-04 MED ADMIN — doxercalciferol (HECTOROL) 4 mcg/2 mL injection 1 mcg: INTRAVENOUS | @ 21:00:00 | NDC 58468012301

## 2013-11-04 MED ADMIN — piperacillin-tazobactam (ZOSYN) 2.25 g in 0.9% sodium chloride (MBP/ADV) 50 mL MBP: INTRAVENOUS | @ 15:00:00 | NDC 25021016430

## 2013-11-04 MED ADMIN — heparinized saline 2 units/mL infusion: @ 18:00:00 | NDC 00409762003

## 2013-11-04 MED ADMIN — vancomycin (VANCOCIN) 1250 mg in NS 250 ml infusion: INTRAVENOUS | @ 21:00:00 | NDC 61553000902

## 2013-11-04 MED ADMIN — cloNIDine HCl (CATAPRES) tablet 0.1 mg: ORAL | NDC 62584065711

## 2013-11-04 MED ADMIN — hydrALAZINE (APRESOLINE) tablet 25 mg: ORAL | @ 03:00:00 | NDC 62584073311

## 2013-11-04 MED ADMIN — piperacillin-tazobactam (ZOSYN) 2.25 g in 0.9% sodium chloride (MBP/ADV) 50 mL MBP: INTRAVENOUS | @ 06:00:00 | NDC 25021016430

## 2013-11-04 MED ADMIN — epoetin alfa (EPOGEN;PROCRIT) injection 10,000 Units: INTRAVENOUS | @ 21:00:00 | NDC 59676031001

## 2013-11-04 MED ADMIN — heparin (porcine) injection 5,000 Units: SUBCUTANEOUS | @ 07:00:00 | NDC 00009029101

## 2013-11-04 MED ADMIN — lidocaine (PF) (XYLOCAINE) 10 mg/mL (1 %) injection soln: SUBCUTANEOUS | @ 18:00:00 | NDC 63323049257

## 2013-11-04 MED ADMIN — calcium acetate (PHOSLO) capsule 667 mg: ORAL | NDC 68084047911

## 2013-11-04 MED ADMIN — heparin (porcine) injection: @ 18:00:00 | NDC 25021040201

## 2013-11-04 MED ADMIN — traMADol (ULTRAM) tablet 50 mg: ORAL | @ 21:00:00 | NDC 51079099101

## 2013-11-04 MED FILL — CALCIUM ACETATE 667 MG CAP: 667 mg | ORAL | Qty: 1

## 2013-11-04 NOTE — Other (Signed)
Bedside and Verbal shift change report given to Tracy Holloman LPN (oncoming nurse) by Kathy Neild RN (offgoing nurse). Report included the following information SBAR, Kardex, MAR and Recent Results.

## 2013-11-04 NOTE — Progress Notes (Signed)
Post-Anesthesia Evaluation & Assessment    BP 171/91   Pulse 66   Temp(Src) 97 ??F (36.1 ??C)   Resp 7   Ht 5\' 5"  (1.651 m)   Wt 81.647 kg (180 lb)   BMI 29.95 kg/m2   SpO2 93%    Nausea/Vomiting: no nausea    Post-operative hydration adequate.    Pain score (VAS): 1    Mental status : alert    Neurological status: moves all extremities, sensation grossly intact    Pulmonary status: airway patent, on 2 liters per minute    Complications related to anesthesia: none

## 2013-11-04 NOTE — Progress Notes (Signed)
Received in bed. A/OX3.Denies pain.No resp. distress noted. NPO for procedure. Dr.Chad McKenzie on floor to assess patient. Guard at bedside. Call bell within reach.

## 2013-11-04 NOTE — Discharge Summary (Addendum)
Physician Discharge Summary     Patient ID:  Tyler Ballard  [id number]  32 y.o.  1981/05/14    Admit date: 11/01/2013    Discharge date and time: 11/04/2013    Admission Diagnoses: Positive blood culture  DX    Discharge Diagnoses:  Principal Diagnosis <principal problem not specified>                                            Active Problems:    Positive blood culture (11/02/2013)      Clotted dialysis access (11/03/2013)      Dialysis patient (11/03/2013)      Secondary hyperparathyroidism (11/03/2013)       1. Positive blood culture reported @ [facility], negative blood c/s.    2. End-stage renal disease on hemodialysis Tuesdays, Thursdays and Saturdays.   3. Hypertension.   4. Elevated lipase, resolved.  5. H/o HIV.  6. Left Atelectasis.     Hospital Course: The patient was admitted from correctional center on 11/02/13 for positive blood c/s found at [facility].  Patient was started on Zosyn and Vancomycin.  Patient had left central PICC for years and c/s x 1 from line is positive. Vascular surgery consult was done and central line was taken out.  Repeat blood c/s x 2 remained negative after 2 days.  Dr. Ardelle Park will continue Vancomycin during HD X 2 more weeks.  Patient was afebrile and had no leukocytosis.  Patient has history of HIV but was not on treatment @ correctional center.  [facility] MD has to arrange outpatient [facility] clinic follow up @ [facility] to begin him on treatment.     Patient also had stenosed right UE AV graft. Dr. [place] McKenzie evaluated him for clotted left arm forearm graft in OR and found that he has multiple stents to central, cephalic vein in upper arm feels stented or totally thrombosed. Has had multiple interventions. As per him, patient likely will do best with cath for immediate dialysis and new graft to follow as an outpatient in future.  Patient was dialysed through [facility] and tolerated it well.  CXR did not show pneumothorax.  ECHO was cancelled as patient had negative blood c/s x 2 on the day  of discharge. Patient had elevated lipase but tolerating diet well.  Lipase was back to Normal and no abdominal tenderness noted.     Patient has persistent left atelectasis on CXR and will be treated with nebs TID X 1 week, Avelox x 5 days and incentive spirometry.  His anti-HTN medications were adjusted to prevent hypotension.     PCP: [facility]    Consults: Nephrology and Vascular Surgery    Significant Diagnostic Studies: 1. PermaCath placement on left side. 2. Right central PICC eas removed. 3. CXR.     Discharge Exam:    BP 173/86   Pulse 74   Temp(Src) 97.4 ??F (36.3 ??C) (Tympanic)   Resp 20   Ht 5\' 5"  (1.651 m)   Wt 81.647 kg (180 lb)   BMI 29.95 kg/m2   SpO2 88%    General appearance: alert, cooperative, no distress, appears stated age  Neck: supple, symmetrical, trachea midline and no JVD. SPo2: 96% on RA.   Lungs: diminished breath sounds L base  Chest wall: no tenderness, left permacath.   Heart: S1, S2 normal  Abdomen: soft, non-tender. Bowel sounds normal. No masses,  no organomegaly  Extremities: no edema    Disposition: correctional center, [facility].     Patient Instructions:     Current Discharge Medication List      START taking these medications    Details   famotidine (PEPCID) 20 mg tablet Take 1 tablet by mouth daily.  Qty: 30 tablet, Refills: 0      VANCOMYCIN/0.9% SOD CHLORIDE (VANCOMYCIN IN 0.9% SODIUM CL) 1 gram/150 mL soln Pharmacy to keep trough between 15 to 20, give it during HD. Vancomycin until 11/19/13.  Qty: 500 mL, Refills: 0      albuterol sulfate (PROVENTIL;VENTOLIN) 2.5 mg/0.5 mL nebu nebulizer solution 0.5 mL by Nebulization route three (3) times daily for 7 days.  Qty: 10.5 mL, Refills: 0      moxifloxacin (AVELOX) 400 mg tablet Take 1 tablet by mouth daily for 5 days.  Qty: 5 tablet, Refills: 0         CONTINUE these medications which have CHANGED    Details   cloNIDine HCl (CATAPRES) 0.1 mg tablet Take 1 tablet by mouth two (2) times a day.  Qty: 60 tablet, Refills: 0      lisinopril  (PRINIVIL, ZESTRIL) 10 mg tablet Take 1 tablet by mouth daily.  Qty: 30 tablet, Refills: 0         CONTINUE these medications which have NOT CHANGED    Details   epoetin alfa (EPOGEN) 10,000 unit/mL injection by SubCUTAneous route once.      doxercalciferol (HECTOROL) 4 mcg/2 mL injection by IntraVENous route.      traMADol (ULTRAM) 50 mg tablet Take 50 mg by mouth every six (6) hours as needed for Pain.      calcium acetate (PHOSLO) 667 mg cap Take  by mouth three (3) times daily (with meals).      mineral oil-isopropyl myristat (HYDROCERIN) lotion Apply  to affected area as needed for Dry Skin.      atenolol (TENORMIN) 50 mg tablet Take  by mouth daily.      hydralazine (APRESOLINE) 50 mg tablet Take 100 mg by mouth three (3) times daily. Indications: HYPERTENSION             Activity: Activity as tolerated  Diet: Cardiac Diet and Renal Diet  Wound Care: None needed    Follow-up with PCP in 3 days.  Continue HD as scheduled, [facility] clinic in 2 weeks.     Signed:  Baltazar Najjar, MD  11/04/2013  6:21 PM

## 2013-11-04 NOTE — Progress Notes (Signed)
DISCHARGE SUMMARY from Nurse    The following personal items are in your possession at time of discharge:    Dental Appliances: None  Visual Aid: None     Home Medications: None  Jewelry: None  Clothing: Slippers;Socks (gown )  Other Valuables: None             PATIENT INSTRUCTIONS:    After general anesthesia or intravenous sedation, for 24 hours or while taking prescription Narcotics:  ?? Limit your activities  ?? Do not drive and operate hazardous machinery  ?? Do not make important personal or business decisions  ?? Do  not drink alcoholic beverages  ?? If you have not urinated within 8 hours after discharge, please contact your surgeon on call.    Report the following to your surgeon:  ?? Excessive pain, swelling, redness or odor of or around the surgical area  ?? Temperature over 100.5  ?? Nausea and vomiting lasting longer than 4 hours or if unable to take medications  ?? Any signs of decreased circulation or nerve impairment to extremity: change in color, persistent  numbness, tingling, coldness or increase pain  ?? Any questions        What to do at Home:  Recommended activity: Activity as tolerated.    If you experience any of the following symptoms chest pain, difficulty breathing, shortness of breath, elevated temperatures above 100.5, or persistent nausea/vomiting or diarrhea please follow up with your primary care physician.        *  Please give a list of your current medications to your Primary Care Provider.    *  Please update this list whenever your medications are discontinued, doses are      changed, or new medications (including over-the-counter products) are added.    *  Please carry medication information at all times in case of emergency situations.          These are general instructions for a healthy lifestyle:    No smoking/ No tobacco products/ Avoid exposure to second hand smoke    Surgeon General's Warning:  Quitting smoking now greatly reduces serious risk to your health.    Obesity, smoking,  and sedentary lifestyle greatly increases your risk for illness    A healthy diet, regular physical exercise & weight monitoring are important for maintaining a healthy lifestyle    You may be retaining fluid if you have a history of heart failure or if you experience any of the following symptoms:  Weight gain of 3 pounds or more overnight or 5 pounds in a week, increased swelling in our hands or feet or shortness of breath while lying flat in bed.  Please call your doctor as soon as you notice any of these symptoms; do not wait until your next office visit.    Recognize signs and symptoms of STROKE:    F-face looks uneven    A-arms unable to move or move unevenly    S-speech slurred or non-existent    T-time-call 911 as soon as signs and symptoms begin-DO NOT go       Back to bed or wait to see if you get better-TIME IS BRAIN.  Patient armband removed and shredded.  MyChart Activation    Thank you for requesting access to MyChart. Please follow the instructions below to securely access and download your online medical record. MyChart allows you to send messages to your doctor, view your test results, renew your prescriptions, schedule appointments, and more.  How Do I Sign Up?    1. In your internet browser, go to [email or web address]  2. Click on the First Time User? Click Here link in the Sign In box. You will be redirect to the New Member Sign Up page.  3. Enter your MyChart Access Code exactly as it appears below. You will not need to use this code after you???ve completed the sign-up process. If you do not sign up before the expiration date, you must request a new code.    MyChart Access Code: [email or web address] @ (This is the date your MyChart access code will expire)    4. Enter the last four digits of your Social Security Number (xxxx) and Date of Birth (mm/dd/yyyy) as indicated and click Submit. You will be taken to the next sign-up page.  5. Create a MyChart ID. This will be your MyChart login ID and cannot be  changed, so think of one that is secure and easy to remember.  6. Create a MyChart password. You can change your password at any time.  7. Enter your Password Reset Question and Answer. This can be used at a later time if you forget your password.   8. Enter your e-mail address. You will receive e-mail notification when new information is available in MyChart.  9. Click Sign Up. You can now view and download portions of your medical record.  10. Click the Download Summary menu link to download a portable copy of your medical information.    Additional Information    If you have questions, please visit the Frequently Asked Questions section of the MyChart website at [email or web address] Remember, MyChart is NOT to be used for urgent needs. For medical emergencies, dial 911.            The discharge information has been reviewed with the patient.  The patient verbalized understanding.

## 2013-11-04 NOTE — Progress Notes (Signed)
RENAL PROGRESS NOTE: Pt seen on dialysis.  Follow up of ESRD     Seen & examined during dialysis       Subjective: No new complain.    Patient is on Dialysis.     Objective:    Patient Vitals for the past 12 hrs:   Temp Pulse Resp BP SpO2   11/04/13 1745 - 73 20 155/88 mmHg -   11/04/13 1730 - 72 20 153/82 mmHg -   11/04/13 1715 - 74 20 134/83 mmHg -   11/04/13 1700 - 74 20 153/85 mmHg -   11/04/13 1645 - 73 20 144/82 mmHg -   11/04/13 1630 - 75 20 140/79 mmHg -   11/04/13 1615 - 72 20 148/83 mmHg -   11/04/13 1600 - 76 20 144/76 mmHg -   11/04/13 1545 - 77 20 135/71 mmHg -   11/04/13 1530 - 77 20 131/76 mmHg -   11/04/13 1500 - 79 20 168/87 mmHg -   11/04/13 1445 - 85 20 171/94 mmHg -   11/04/13 1430 - 74 20 162/77 mmHg -   11/04/13 1330 - 72 21 135/70 mmHg 88 %   11/04/13 1320 - 70 20 153/73 mmHg 92 %   11/04/13 1310 - 66 16 161/87 mmHg 93 %   11/04/13 1300 - 66 7 171/91 mmHg 93 %   11/04/13 1250 - - 11 217/92 mmHg 100 %   11/04/13 1049 - - 22 173/86 mmHg 98 %   11/04/13 1018 97 ??F (36.1 ??C) 61 18 193/74 mmHg 97 %   11/04/13 0728 97.5 ??F (36.4 ??C) 61 18 173/80 mmHg 99 %        Intake/Output Summary (Last 24 hours) at 11/04/13 1753  Last data filed at 11/04/13 1024   Gross per 24 hour   Intake    370 ml   Output      0 ml   Net    370 ml       Physical Assessment:     General Appearance: NAD  Lung: clear to auscultation  Heart: regular rate and rhythm and no murmurs, clicks or gallops  Lower Extremities: edema   Access: (L) IJ TDC , qb 400, old AVG could not be declotted , has multiple stents. Will need new AVF.  Labs    CBC w/Diff    Recent Labs      11/04/13   0411  11/03/13   0148  11/01/13   2314   WBC  2.6*  2.4*  2.1*   RBC  2.94*  2.95*  3.10*   HGB  9.0*  8.9*  9.3*   HCT  27.0*  27.7*  29.4*   MCV  91.8  93.9  94.8   MCH  30.6  30.2  30.0   MCHC  33.3  32.1  31.6   RDW  14.9*  15.3*  15.4*    Recent Labs      11/04/13   0411  11/03/13   0148  11/01/13   2314   BANDS   --   2   --    MONOS  17*  23*   29*   EOS  6*  5  5   BASOS  1  0  0   RDW  14.9*  15.3*  15.4*        Comprehensive Metabolic Profile    Recent Labs      11/04/13   0411  11/03/13  0148  11/01/13   2314   NA  135*  137  136   K  4.8  4.3  3.6   CL  99*  100  97*   CO2  25  28  32   BUN  62*  40*  22*   CREA  7.97*  6.20*  4.16*    Recent Labs      11/04/13   0411  11/03/13   0400  11/03/13   0148  11/01/13   2314   CA  8.4*  8.6  8.6  9.3   PHOS  8.2*   --   7.4*   --    ALB   --    --    --   3.0*   TP   --    --    --   9.8*   SGOT   --    --    --   33   TBILI   --    --    --   0.3          Basic Metabolic Profile       results  reviwed.          MEDS:Reviwed.  Current Facility-Administered Medications   Medication Dose Route Frequency Provider Last Rate Last Dose   ??? [COMPLETED] vancomycin (VANCOCIN) 1250 mg in NS 250 ml infusion  1,250 mg IntraVENous ONCE [place]   1,250 mg at 11/04/13 1553   ??? midazolam (VERSED) 1 mg/mL injection            ??? fentaNYL citrate (PF) 50 mcg/mL injection            ??? propofol (DIPRIVAN) 10 mg/mL injection            ??? traMADol (ULTRAM) 50 mg tablet            ??? doxercalciferol (HECTOROL) 4 mcg/2 mL injection            ??? famotidine (PEPCID) tablet 20 mg  20 mg Oral DAILY Evalee Jefferson, MD   20 mg at 11/03/13 1545   ??? atenolol (TENORMIN) tablet 50 mg  50 mg Oral DAILY [place]   50 mg at 11/02/13 4782   ??? calcium acetate (PHOSLO) capsule 667 mg  1 capsule Oral TID WITH MEALS [place]   667 mg at 11/03/13 1742   ??? traMADol (ULTRAM) tablet 50 mg  50 mg Oral Q6H PRN [place]   50 mg at 11/04/13 1613   ??? acetaminophen (TYLENOL) tablet 650 mg  650 mg Oral Q4H PRN [place]       ??? ondansetron (ZOFRAN) injection 4 mg  4 mg IntraVENous Q8H PRN [place]   4 mg at 11/02/13 0300   ??? heparin (porcine) injection 5,000 Units  5,000 Units SubCUTAneous Q8H [place]       ??? piperacillin-tazobactam (ZOSYN) 2.25 g in 0.9% sodium chloride (MBP/ADV) 50 mL MBP  2.25 g IntraVENous  Q8H [place] 100 mL/hr at 11/04/13 0952 2.25 g at 11/04/13 0952   ??? VANCOMYCIN INFORMATION NOTE   Other CONTINUOUS [place] [place]       ??? hydrALAZINE (APRESOLINE) tablet 25 mg  25 mg Oral TID Evalee Jefferson, MD   25 mg at 11/03/13 2147   ??? cloNIDine HCl (CATAPRES) tablet 0.1 mg  0.1 mg Oral BID Evalee Jefferson, MD   0.1 mg  at 11/03/13 1742   ??? epoetin alfa (EPOGEN;PROCRIT) injection 10,000 Units  10,000 Units IntraVENous [facility] MON[facility] Timothy Lasso, MD   10,000 Units at 11/04/13 1552   ??? doxercalciferol (HECTOROL) 4 mcg/2 mL injection 1 mcg  1 mcg IntraVENous D[facility]IS MON[facility]ED [facility] Timothy Lasso, MD   1 mcg at 11/04/13 1553       Impression:    ESRD: Tolerating HD well.  Anemia of CKD: on  Epogen.  Hyperparathyroidism Yes  Old catheter sepsis  Plan  Dialysis for volume and solute management  Epogen  10000 units.  Hectorol : 1 microgram.  Vancomycin 1200 mg IV,   OK to DC , will continue Vancomycin at correctional center for 2 weeks.        Timothy Lasso, MD

## 2013-11-04 NOTE — Progress Notes (Signed)
Off floor to CVT (OR) via bed.

## 2013-11-04 NOTE — Progress Notes (Signed)
SUBJECTIVE:    Seen in HD.  Completed HD through permacath. No chest or abdominal pain.  Talked to dr. Ardelle Park - can go back to correctional center and Dr. Ardelle Park will take care of vancomycin x 2 more weeks during HD.     OBJECTIVE:    BP 173/86   Pulse 74   Temp(Src) 97.4 ??F (36.3 ??C) (Tympanic)   Resp 20   Ht 5\' 5"  (1.651 m)   Wt 81.647 kg (180 lb)   BMI 29.95 kg/m2   SpO2 88%  CTA, left PermaCath.    RRR  NT, BS +  No pedal edema  NAD    A/P:    1. Positive blood culture reported @ [facility]:  Cont vancomycin for 2 weeks per dr. Ardelle Park.  Repeat c/s negative. dr. Ronne Binning removed central PICC and placed left PermaCath as could not de-clot AV fistula per dr. Ardelle Park.    2. End-stage renal disease on hemodialysis Tuesdays, Thursdays and Saturdays.  3. Hypertension. Cont current dose of Hydralazine, Atenolol and clonidine.   4. Elevated lipase - resolved.  No clinical s/o pancreatitis.  5. HIV - Not on any medication per [facility] medication list.  Patient can follow with [facility] clinic in 2 weeks.    --> D/C patient to [facility].       CMP:   Lab Results   Component Value Date/Time    NA 135* 11/04/2013  4:11 AM    K 4.8 11/04/2013  4:11 AM    CL 99* 11/04/2013  4:11 AM    CO2 25 11/04/2013  4:11 AM    AGAP 11 11/04/2013  4:11 AM    GLU 97 11/04/2013  4:11 AM    BUN 62* 11/04/2013  4:11 AM    CREA 7.97* 11/04/2013  4:11 AM    GFRAA 10* 11/04/2013  4:11 AM    GFRNA 8* 11/04/2013  4:11 AM    CA 8.4* 11/04/2013  4:11 AM    PHOS 8.2* 11/04/2013  4:11 AM     CBC:   Lab Results   Component Value Date/Time    WBC 2.6* 11/04/2013  4:11 AM    HGB 9.0* 11/04/2013  4:11 AM    HCT 27.0* 11/04/2013  4:11 AM    PLT 123* 11/04/2013  4:11 AM       Current Discharge Medication List      START taking these medications    Details   famotidine (PEPCID) 20 mg tablet Take 1 tablet by mouth daily.  Qty: 30 tablet, Refills: 0      VANCOMYCIN/0.9% SOD CHLORIDE (VANCOMYCIN IN 0.9% SODIUM CL) 1 gram/150 mL soln Pharmacy to keep trough between 15 to 20, give it  during HD. Vancomycin until 11/19/13.  Qty: 500 mL, Refills: 0         CONTINUE these medications which have CHANGED    Details   cloNIDine HCl (CATAPRES) 0.1 mg tablet Take 1 tablet by mouth two (2) times a day.  Qty: 60 tablet, Refills: 0      lisinopril (PRINIVIL, ZESTRIL) 10 mg tablet Take 1 tablet by mouth daily.  Qty: 30 tablet, Refills: 0         CONTINUE these medications which have NOT CHANGED    Details   epoetin alfa (EPOGEN) 10,000 unit/mL injection by SubCUTAneous route once.      doxercalciferol (HECTOROL) 4 mcg/2 mL injection by IntraVENous route.      traMADol (ULTRAM) 50 mg tablet Take 50 mg  by mouth every six (6) hours as needed for Pain.      calcium acetate (PHOSLO) 667 mg cap Take  by mouth three (3) times daily (with meals).      mineral oil-isopropyl myristat (HYDROCERIN) lotion Apply  to affected area as needed for Dry Skin.      atenolol (TENORMIN) 50 mg tablet Take  by mouth daily.      hydralazine (APRESOLINE) 50 mg tablet Take 100 mg by mouth three (3) times daily. Indications: HYPERTENSION

## 2013-11-04 NOTE — Other (Signed)
TRANSFER - IN REPORT:    Verbal report received from Tracey RN(name) on [employer]  being received from 469(unit) for ordered procedure      Report consisted of patient???s Situation, Background, Assessment and   Recommendations(SBAR).     Information from the following report(s) SBAR and MAR was reviewed with the receiving nurse.    Opportunity for questions and clarification was provided.      Assessment completed upon patient???s arrival to unit and care assumed.

## 2013-11-04 NOTE — Progress Notes (Signed)
Pt discharged to [facility].

## 2013-11-04 NOTE — Other (Signed)
350          ACUTE HEMODIALYSIS FLOW SHEET    HEMODIALYSIS ORDERS: Physician: Ardelle Park     Dialyzer:  Revaclear         Duration: 3.5  BFR: 400    DFR 800   Dialysate:  K+ 2        Ca+ 2.5    Weight:    kg    Bed Scale []      Unable to Obtain []       UF Goal:  3000       Heparin []   Bolus      Units    []  Hourly       Units    [x]  None   Pre BP146/75:       Pulse:   75          Temperature:  98.8   Tx: NS           ml/Bolus  Other        []  N/A   Labs: Pre        Post:        [x]  N/A   Additional Orders: hectorol, epogen, vancomycin          [x]  Time Out/Safety Check  [x]  [facility] Consent Verified     CATHETER ACCESS: [] N/A   [] Right   [x] Left   [x] IJ     [] Fem   []  First use X-ray verified     [x] Tunnel                []  Non Tunneled   [x] No S/S infection  [] Redness  [] Drainage [] Cultured [] Swelling [] Pain   [x] Medical Aseptic Prep Utilized   [x] Dressing Changed  [x]  Biopatch  Date:  11/04/13     [] Clotted   [] Patent   Flows: [x] Good  [] Poor  [] Reversed   If access problem, Dr. notified: [] Yes    [x] N/A  Date:           GRAFT/FISTULA ACCESS:  [] N/A     [x] Right     [] Left     [] UE     [] LE   [] AVG   [x] AVF (not being used)        [] Buttonhole    [] Medical Aseptic Prep Utilized   [] No S/S infection  [] Redness  [] Drainage [] Cultured [] Swelling [] Pain    Bruit:   []  Strong    []  Weak       Thrill :   []  Strong    []  Weak       Needle Gauge:         Length:      If access problem, Dr. notified: [] Yes     [] N/A  Date:             GENERAL ASSESSMENT:   LUNGS:  Sat%           [x]  Clear  []  Coarse  []  Crackles  []  Wheezing        []  Diminished     Location : [] RRL   [] LLL    [] RUL  [] LUL   Cough: [] Productive  [] Dry  [] N/A   Respirations:  [x] Easy  [] Labored   Therapy:  [x] RA  [] NC         l/min    Mask: [] NRB [] Venti       O2%                  [] Ventilator  [] Intubated  [] Janina Mayo  CARDIAC: [] Regular      [x]  Irregular   []  Pericardial Rub  []  JVD        []   Monitored  []  N/A  Rhythm:         EDEMA: [x]  None  [] Generalized  []   Pitting []  1    []  2    []  3    []  4                 []  Facial  []  Pedal  []   UE  []  LE   SKIN:   [x]  Warm  []  Hot     []  Cold   [x]  Dry     []  Pale   []  Diaphoretic                  []  Flushed  []  Jaundiced  []  Cyanotic  []  Rash  []  Weeping   LOC:    [x]  Alert      [x] Oriented:    [x]  Person     [x]  Place  [x] Time               []  Confused  []  Lethargic  []  Medicated  []  Non-responsive     GI / ABDOMEN:  []  Flat    []  Distended    [x]  Soft    []  Firm   []   Obese                             []  Diarrhea  [x]  Bowel Sounds  []  Nausea  []  Vomiting      GU / URINE ASSESSMENT:  []  Voiding   [x]  Oliguria  []  Anuria   []   Foley     []  Incontinent    []   Incontinent Brief      []   Bathroom Privileges     PAIN: []  0=None [] 1  [] 2   [] 3   [] 4   [x] 5   [] 6   [] 7   [] 8   [] 9   [] 10            Scale 0-10  Action/Follow Up:         MOBILITY:  []  Amb    []  Amb/Assist    [x]  Bed    []  Wheelchair  []  Stretcher      All Vitals and Treatment Details on Attached Flowsheet       Hospital:      [facility]     Room #                [id number]     []  1st Time Acute  []  Stat  [x]  Routine  []  Urgent     [x]  Acute Room  []   Bedside  []  ICU/CCU  []  ER   Isolation Precautions:  [x]  Dialysis   []  Airborne   []  Contact    []  Reverse   Special Considerations:         []  Blood Consent Verified [x] N/A     ALLERGIES:   [x]  NKA           Code Status:  [x]  Full Code  []  DNR  []  Other           HBsAg ONLY: Date Drawn   08/03/13             [] Negative [] Positive [] Unknown   HBsAb: Date   08/03/13         []   Susceptible   []  Immune10 [] Not Drawn      Current Labs:    Date of Labs:                Today [x]      Results for Tyler Ballard, Tyler Ballard (MRN [id number]) as of 11/04/2013 15:15   Ref. Range 11/04/2013 04:11   WBC Latest Range: 4.6-13.2 K/uL 2.6 (L)   RBC Latest Range: 4.70-5.50 M/uL 2.94 (L)   HGB Latest Range: 13.0-16.0 g/dL 9.0 (L)   HCT Latest Range: 36.0-48.0 % 27.0 (L)   MCV Latest Range: 74.0-97.0 FL 91.8   MCH Latest Range: 24.0-34.0 PG 30.6   MCHC Latest  Range: 31.0-37.0 g/dL 13.2   RDW Latest Range: 11.6-14.5 % 14.9 (H)   PLATELET Latest Range: 135-420 K/uL 123 (L)   MPV Latest Range: 9.2-11.8 FL 10.4   NEUTROPHILS Latest Range: 40-73 % 46   LYMPHOCYTES Latest Range: 21-52 % 30   MONOCYTES Latest Range: 3-10 % 17 (H)   EOSINOPHILS Latest Range: 0-5 % 6 (H)   BASOPHILS Latest Range: 0-2 % 1   DF No range found AUTOMATED   ABS. NEUTROPHILS Latest Range: 1.8-8.0 K/UL 1.2 (L)   ABS. LYMPHOCYTES Latest Range: 0.9-3.6 K/UL 0.8 (L)   ABS. MONOCYTES Latest Range: 0.05-1.2 K/UL 0.4   ABS. EOSINOPHILS Latest Range: 0.0-0.4 K/UL 0.2   ABS. BASOPHILS Latest Range: 0.0-0.1 K/UL 0.0   Sodium Latest Range: 136-145 mmol/L 135 (L)   Potassium Latest Range: 3.5-5.5 mmol/L 4.8   Chloride Latest Range: 100-108 mmol/L 99 (L)   CO2 Latest Range: 21-32 mmol/L 25   Anion gap Latest Range: 3.0-18 mmol/L 11   Glucose Latest Range: 74-99 mg/dL 97   BUN Latest Range: 7.0-18 MG/DL 62 (H)   Creatinine Latest Range: 0.6-1.3 MG/DL 4.40 (H)   BUN/Creatinine ratio Latest Range: 12-20   8 (L)   Calcium Latest Range: 8.5-10.1 MG/DL 8.4 (L)   Phosphorus Latest Range: 2.5-4.9 MG/DL 8.2 (H)   GFR est non-AA Latest Range: >60 ml/min/1.33m2 8 (L)   GFR est AA Latest Range: >60 ml/min/1.42m2 10 (L)   VANCOMYCIN,RANDOM Latest Range: 5.0-40 UG/ML 12.2                                                                                                                                 DIET:  []  Renal    [x]  Other     []  NPO     []   Diabetic      PRIMARY NURSE REPORT: First initial/Last name/Title      Pre Dialysis:  M Tozier RN   Time: 1400       EDUCATION:   []  Patient []  Other         Knowledge Basis: [] None [x] Minimal [] Substantial   []  Access Care     []  S&S of infection     []  Fluid Management     [] K+     []   Procedural    [] Albumin     []  Medications     []  Tx Options     []  Transplant     []  Diet     []  Other   Teaching Tools:  []  Explain  []  Demo  []  Handouts []  Video   Inappropriate due to             RO/HEMODIALYSIS MACHINE SAFETY CHECKS ??? Before each treatment:     Machine Number: [x]  Machine/RO #7                                  []  Machine/RO #2   ([address])  Alarm Test: [x] Pass           Other:         [x]  RO/Machine Log Complete   Dialysate: pH     7.4  Temp 36.0    [x] Extracorporeal Circuit Tested for integrity   Conductivity: Meter 14.0   HD Machine 14.2      CHLORINE TESTING-Before each treatment and every 4 hours    Total Chlorine: [x]  less than 0.1 ppm  Time:  1415   4 Hr Check Time:         (if greater than 0.1 ppm from Primary then every 30 minutes from Secondary)     TREATMENT INITIATION ??? with Dialysis Precautions:   [x]  All Connections Secured                 [x]  Saline Line Double Clamped   [x]  Venous Parameters Set                  [x]  Arterial Parameters Set    [x]  Prime Given  ml 200              [x] Air Foam Detector Engaged      Treatment Initiation Note: received pt in bed.alert and awake, L iJ cath oozing blood, cath dressing changed and gauze applied. L IJ accessed, both arterial and venous port aspirates and flushes well. Hemodialysis now started . Will continue to monitor pt vital signs and pt status.       Medication Dose Volume Route Initials Dialyzer Cleared: [x]  Good []  Fair  []  Poor    Blood processed: 77.4   L  UF Removed  3000 Ml    Post Wt: 76.2     kg  POst BP: 173/86         Pulse:  74     Temperature:97.4     hectorol              ml      iv      rl   Post Tx Vascular Access: AVF/AVG: Bleeding stopped Art     x min. Ven.    x Min       epogen     10000 units       ml      iv     rl     Catheter: Locking solution: Heparin 1:1000   Art. 2.1     Ven.  2.1       vancomyci      1250mg       ml       iv     rl    Post Assessment:  Skin:    [x]  Warm  [x]  Dry []  Diaphoretic     []  Flushed  []  Pale []  Cyanotic   [facility] Signatures Title Initials  Time Lungs: [x]  Clear    []  Course  []  Crackles  []  Wheezing   Rlongshore RN RL [id number] Cardiac: [x]  Regular    []  Irregular   []  Monitor  []  N/A  Rhythm:           Edema:  [x]  None    []  General     []  Facial   []  Pedal    []  UE    []  LE       Pain: [x] 0=None  [] 1  [] 2   [] 3  [] 4   [] 5   [] 6   [] 7   [] 8   [] 9   [] 10         Post Treatment Note:  Hemodialysis completed. 3 liters removed. Pt and vital sign stable. pt tolerated tx well. L IJ cath flushed with normal saline and heparin. Pt will return to room.        POST TREATMENT PRIMARY NURSE HANDOFF REPORT:     First initial/Last name/Title         Post Dialysis:    S. Allen RN    Time:   1815        Abbreviations: AVG-arterial venous graft, AVF-arterial venous fistula, IJ-Internal Jugular, Subcl-Subclavian, Fem-Femoral, Tx-treatment, AP/HR-apical heart rate, DFR-dialysate flow rate, BFR-blood flow rate, AP-arterial pressure, VP-venous pressure, UF-ultrafiltrate, TMP-transmembrane pressure, Ven-Venous, Art-Arterial, RO-Reverse Osmosis

## 2013-11-04 NOTE — Progress Notes (Signed)
Clotted left arm forearm graft with multiple stents to central, cephalic vein in upper arm feels stented or totally thrombosed.  Has had multiple interventions  Likely will do best with cath for immediate dialysis and new graft to follow

## 2013-11-04 NOTE — Progress Notes (Signed)
32 yo dialysis patient presents for a permacath placement. Chart reviewed, patient examined and was already consented for GA.

## 2013-11-04 NOTE — Progress Notes (Addendum)
Spoke with Sales executive at [facility] report given.

## 2013-11-04 NOTE — Progress Notes (Signed)
Awaiting for Declotting of AVG, will dialyze once declotting is done.

## 2013-11-05 NOTE — Op Note (Signed)
[facility]                               [address] [place], [place] [address]                                 OPERATIVE REPORT    PATIENT:     Tyler Ballard, Tyler Ballard  MRN:            [id number]      DATE:   11/04/2013  BILLING:     [id number]  ROOM:        [id number]  ATTENDING:   Eartha Inch, MD  DICTATING:   [place] Izael Bessinger, DO      SURGEON: [place] M Kelby Adell, DO.    PREOPERATIVE DIAGNOSIS: Renal failure with sepsis.    POSTOPERATIVE DIAGNOSIS:    PROCEDURES PERFORMED  1. Removal of peripherally inserted central catheter (PICC) line.  2. Placement of left internal jugular tunneled dialysis catheter with  fluoroscopy.    COMPLICATIONS: Zero.    ESTIMATED BLOOD LOSS: Zero.    SPECIMENS REMOVED: 0    CONDITION OF THE PATIENT: Stable.    DESCRIPTION OF PROCEDURE: The patient is a 32 year old complex dialysis  patient treated elsewhere, but is currently incarcerated and here with  sepsis. Discussed with him at the bedside at length. He tells me he has  been using his left arm for dialysis. I felt his left forearm graft is  hard, clotted, and appears to be long time clotted. 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 The team suspects sepsis  related to this PICC line, asked for me to remove this and place some type  of a usable dialysis access. He did agree after a long discussion with him  about this.    He was brought to the OR, placed on the table in supine position, ended up  having to have general anesthesia because he really even with moderate  sedation was quite awake and difficult to hold still with general he was  fine. His neck and chest wall were prepped and draped in the usual standard  fashion following CDC guidelines for aseptic technique. 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 On the right,  again, there is stent deployed in the subclavian covering the IJ, which  explains the chronic IJ occlusion. The only usable side, I see on the upper  body is just a PICC line. I cut down this on the IJ portion of the PICC  line, brought it up and transected it and exchanged the inner portion for a  Glidewire. I placed this Glidewire all the way down to the inferior vena  cava to get a good purchase. I removed the external portion of the Glide. I  made  a new incision, tunneled the new clean site with a 28 cm Palindrome to  the guidewire. After serial dilatation, and split sheath placed the  PermCath into the central position, appropriately seen with fluoroscopy.  There were no kinks. It flushes and aspirates nicely. It was secured using  Prolene sutures. Skin site was closed with 4-0 Monocryl. Dermabond was  applied. He was transferred back to recovery. He can use the dialysis  catheter as needed. He can followup with his regular doctor for this  fistula and for his chronic access. He is a very confusing picture with  these multiple stents, I do know that he has much left other than the  fistula on his upper extremities for dialysis.                     [place] Meila Berke, DO    CM:wmx  D: 11/05/2013 T: 11/05/2013 11:09 P  Job: [id number]  CScriptDoc #: [id number]  cc:   [place] Nicholos Aloisi, DO        Eartha Inch, MD

## 2013-11-05 NOTE — Op Note (Signed)
[facility]                               [address]                          [place], [place] [address]                                 OPERATIVE REPORT    PATIENT:     Tyler Ballard  MRN:            [id number]      DATE:   11/04/2013  BILLING:     [id number]  ROOM:        [id number]  ATTENDING:   OLUKAYODE O OJO, MD  DICTATING:   Neema Barreira, DO      SURGEON: Jassmin Kemmerer M Suprena Travaglini, DO.    PREOPERATIVE DIAGNOSIS: Renal failure with sepsis.    POSTOPERATIVE DIAGNOSIS:    PROCEDURES PERFORMED  1. Removal of peripherally inserted central catheter (PICC) line.  2. Placement of left internal jugular tunneled dialysis catheter with  fluoroscopy.    COMPLICATIONS: Zero.    ESTIMATED BLOOD LOSS: Zero.    SPECIMENS REMOVED: 0    CONDITION OF THE PATIENT: Stable.    DESCRIPTION OF PROCEDURE: The patient is a 31-year-old complex dialysis  patient treated elsewhere, but is currently incarcerated and here with  sepsis. Discussed with him at the bedside at length. He tells me he has  been using his left arm for dialysis. I felt his left forearm graft is  hard, clotted, and appears to be long time clotted. 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 The team suspects sepsis  related to this PICC line, asked for me to remove this and place some type  of a usable dialysis access. He did agree after a long discussion with him  about this.    He was brought to the OR, placed on the table in supine position, ended up  having to have general anesthesia because he really even with moderate  sedation was quite awake and difficult to hold still with general he was  fine. His neck and chest wall were prepped and draped in the usual standard  fashion following CDC guidelines for aseptic technique. 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 On the right,  again, there is stent deployed in the subclavian covering the IJ, which  explains the chronic IJ occlusion. The only usable side, I see on the upper  body is just a PICC line. I cut down this on the IJ portion of the PICC  line, brought it up and transected it and exchanged the inner portion for a  Glidewire. I placed this Glidewire all the way down to the inferior vena  cava to get a good purchase. I removed the external portion of the Glide. I  made   a new incision, tunneled the new clean site with a 28 cm Palindrome to  the guidewire. After serial dilatation, and split sheath placed the  PermCath into the central position, appropriately seen with fluoroscopy.  There were no kinks. It flushes and aspirates nicely. It was secured using  Prolene sutures. Skin site was closed with 4-0 Monocryl. Dermabond was  applied. He was transferred back to recovery. He can use the dialysis  catheter as needed. He can followup with his regular doctor for this  fistula and for his chronic access. He is a very confusing picture with  these multiple stents, I do know that he has much left other than the  fistula on his upper extremities for dialysis.                     Leidi Astle, DO    CM:wmx  D: 11/05/2013 T: 11/05/2013 11:09 P  Job: [id number]  CScriptDoc #: [id number]  cc:   Kobie Whidby, DO        OLUKAYODE O OJO, MD

## 2013-11-07 LAB — CULTURE, BLOOD: Culture result:: 2

## 2013-11-08 LAB — CULTURE, BLOOD
Culture result:: NO GROWTH
Culture result:: NO GROWTH

## 2013-11-25 NOTE — Patient Instructions (Signed)
Please call the office at [phone] if you have further questions.

## 2013-11-25 NOTE — Progress Notes (Signed)
Post Removal of Tunneled Dialysis Catheter Note    11/25/2013     [facility]. Had LIJ tunneled dialysis catheter placed by Dr Ronne Binning two weeks ago. Reportedly his left arm access is working well.     Procedure(s): Tunneled catheter removed without complications.    Sutures necessary: No    Findings: Anchoring sutures removed. Cleansed site with betadine. Catheter removed in its entirety. Tip was not sent for culture. Pressure dressing applied.    Complications: None    Plan: Per Nephrology    Follow Up: prn    Signed By: Cecille Aver, PA  11/25/2013  3:57 PM

## 2014-03-28 NOTE — ED Notes (Signed)
Pt c/o N/V over the past two days.  Pt also reports abdominal pain, pt states, "I think it's my pancreatitis."

## 2014-03-28 NOTE — ED Notes (Signed)
Pt ambulatory to bathroom, in nad;

## 2014-03-28 NOTE — ED Notes (Signed)
Attempted IV access X 2 without success.

## 2014-03-28 NOTE — ED Provider Notes (Addendum)
HPI Comments: 11:05 PM  33 y.o. male presents to the ED C/O epigastric abdominal pain, nausea, vomiting onset 2 days ago. Pt has a hx of pancreatitis, and believes this is a pancreatitis flare-up. PMHx includes kidney disease, HTN. Pt last had dialysis yesterday. Pt denies ETOH use, hx of gallstones, hx of cholecystitis, fever, chills, and any other sxs or complaints at this time.    The history is provided by the patient. No language interpreter was used.   Written By Grace Blight, ED Scribe, as dictated by Ritta Slot, DO.       Past Medical History   Diagnosis Date   ??? Sickle cell anemia ([place])      NEGATIVE test. Likely patient states this Dx for secondary gain   ??? HTN (hypertension) 07/06/2012   ??? Chronic kidney disease (CKD), stage V ([place])    ??? AVN (avascular necrosis of bone) ([facility])      right hip   ??? Malingering      Sickle Cell testing negative   ??? HTN (hypertension)    ??? Sickle cell disease ([place])      TOLD BY PATIENT AND NOT TRUE   ??? Malingering      Patient states has had sickle cell disease since age of 34, hemoglobin electrophoresis at [facility] and [facility] both negative for for sickle cell disease   ??? ESRD on hemodialysis ([place])    ??? Noncompliance of patient with renal dialysis ([place]) 09/06/2013   ??? HIV (human immunodeficiency virus infection) ([place])    ??? Hypertension    ??? Dialysis patient [facility])    ??? Chronic kidney disease         Past Surgical History   Procedure Laterality Date   ??? Hx orthopaedic  2012     Rt hip decompression   ??? Hx orthopaedic       rt hip    ??? Hx vascular access       av  fistula right arm.   ??? Hx orthopaedic       RIGHT HIP   ??? Hx vascular access       old avg left arm, avf right arm   ??? Hx orthopaedic       right hip surgery   ??? Hx orthopaedic       right hip    ??? Vascular surgery procedure unlist       Dialysis Access         Family History   Problem Relation Age of Onset   ??? Sickle Cell Anemia Sister    ??? Hypertension Mother    ??? Sickle Cell Anemia     ??? Cancer Mother    ??? Sickle  Cell Anemia Mother    ??? Hypertension Father    ??? Sickle Cell Anemia Father         History     Social History   ??? Marital Status: SINGLE     Spouse Name: N/A     Number of Children: N/A   ??? Years of Education: N/A     Occupational History   ??? Not on file.     Social History Main Topics   ??? Smoking status: Not on file   ??? Smokeless tobacco: Not on file   ??? Alcohol Use: No   ??? Drug Use: No   ??? Sexual Activity: No     Other Topics Concern   ??? Not on file     Social History  Narrative    ** Merged History Encounter **         ** Merged History Encounter **         ** Merged History Encounter **         ** Merged History Encounter **         ** Merged History Encounter **                       ALLERGIES: Review of patient's allergies indicates no known allergies.      Review of Systems   Constitutional: Negative.  Negative for fever and chills.   HENT: Negative for congestion and ear pain.    Eyes: Negative for photophobia, pain and discharge.   Respiratory: Negative for cough, chest tightness and shortness of breath.    Cardiovascular: Negative for chest pain and palpitations.   Gastrointestinal: Positive for nausea, vomiting and abdominal pain. Negative for diarrhea.   Genitourinary: Negative for dysuria and frequency.   Musculoskeletal: Negative for arthralgias, neck pain and neck stiffness.   Skin: Negative for color change.   Neurological: Negative for dizziness and headaches.   Hematological: Negative for adenopathy.   Psychiatric/Behavioral: Negative for behavioral problems.   All other systems reviewed and are negative.      Filed Vitals:    03/28/14 2115   BP: 106/70   Pulse: 97   Temp: 98.1 ??F (36.7 ??C)   Resp: 18   Height: $Remove'5\' 5"'QMZgYlK$  (1.651 m)   Weight: 77.111 kg (170 lb)   SpO2: 94%            Physical Exam   Constitutional: He is oriented to person, place, and time. He appears well-developed and well-nourished.   HENT:   Head: Normocephalic and atraumatic.   Eyes: Conjunctivae and EOM are normal. Pupils are equal,  round, and reactive to light.   Neck: Normal range of motion. Neck supple.   Cardiovascular: Normal rate, regular rhythm and normal heart sounds.    No murmur heard.  Pulmonary/Chest: Effort normal and breath sounds normal. No respiratory distress.   Abdominal: Soft. Bowel sounds are normal. There is tenderness in the epigastric area. There is no rebound and no guarding.   Minimal mid-epigastric   Musculoskeletal: Normal range of motion. He exhibits no edema.   Lymphadenopathy:     He has no cervical adenopathy.   Neurological: He is alert and oriented to person, place, and time. No cranial nerve deficit.   Skin: Skin is warm and dry. No rash noted. No erythema.   Psychiatric: He has a normal mood and affect. His behavior is normal.   Nursing note and vitals reviewed.     RESULTS    Labs Reviewed   CBC WITH AUTOMATED DIFF - Abnormal; Notable for the following:     WBC 3.7 (*)     RBC 3.55 (*)     HGB 11.3 (*)     HCT 34.5 (*)     MCV 97.2 (*)     RDW 15.1 (*)     PLATELET 134 (*)     MONOCYTES 12 (*)     All other components within normal limits   METABOLIC PANEL, COMPREHENSIVE - Abnormal; Notable for the following:     Glucose 168 (*)     BUN 61 (*)     Creatinine 10.43 (*)     BUN/Creatinine ratio 6 (*)     GFR est AA 7 (*)  GFR est non-AA 6 (*)     Alk. phosphatase 227 (*)     Protein, total 9.4 (*)     Albumin 3.0 (*)     Globulin 6.4 (*)     A-G Ratio 0.5 (*)     All other components within normal limits   LIPASE - Abnormal; Notable for the following:     Lipase 503 (*)     All other components within normal limits   URINALYSIS W/ RFLX MICROSCOPIC        Recent Results (from the past 12 hour(s))   CBC WITH AUTOMATED DIFF    Collection Time     03/28/14 11:20 PM       Result Value Ref Range    WBC 3.7 (*) 4.6 - 13.2 K/uL    RBC 3.55 (*) 4.70 - 5.50 M/uL    HGB 11.3 (*) 13.0 - 16.0 g/dL    HCT 34.5 (*) 36.0 - 48.0 %    MCV 97.2 (*) 74.0 - 97.0 FL    MCH 31.8  24.0 - 34.0 PG    MCHC 32.8  31.0 - 37.0 g/dL     RDW 15.1 (*) 11.6 - 14.5 %    PLATELET 134 (*) 135 - 420 K/uL    MPV 10.1  9.2 - 11.8 FL    NEUTROPHILS 56  40 - 73 %    LYMPHOCYTES 29  21 - 52 %    MONOCYTES 12 (*) 3 - 10 %    EOSINOPHILS 3  0 - 5 %    BASOPHILS 0  0 - 2 %    ABS. NEUTROPHILS 2.1  1.8 - 8.0 K/UL    ABS. LYMPHOCYTES 1.1  0.9 - 3.6 K/UL    ABS. MONOCYTES 0.4  0.05 - 1.2 K/UL    ABS. EOSINOPHILS 0.1  0.0 - 0.4 K/UL    ABS. BASOPHILS 0.0  0.0 - 0.06 K/UL    DF AUTOMATED     METABOLIC PANEL, COMPREHENSIVE    Collection Time     03/28/14 11:20 PM       Result Value Ref Range    Sodium 144  136 - 145 mmol/L    Potassium 4.3  3.5 - 5.5 mmol/L    Chloride 101  100 - 108 mmol/L    CO2 30  21 - 32 mmol/L    Anion gap 13  3.0 - 18 mmol/L    Glucose 168 (*) 74 - 99 mg/dL    BUN 61 (*) 7.0 - 18 MG/DL    Creatinine 10.43 (*) 0.6 - 1.3 MG/DL    BUN/Creatinine ratio 6 (*) 12 - 20      GFR est AA 7 (*) >60 ml/min/1.9m2    GFR est non-AA 6 (*) >60 ml/min/1.90m2    Calcium 8.6  8.5 - 10.1 MG/DL    Bilirubin, total 0.3  0.2 - 1.0 MG/DL    ALT 39  16 - 61 U/L    AST 32  15 - 37 U/L    Alk. phosphatase 227 (*) 45 - 117 U/L    Protein, total 9.4 (*) 6.4 - 8.2 g/dL    Albumin 3.0 (*) 3.4 - 5.0 g/dL    Globulin 6.4 (*) 2.0 - 4.0 g/dL    A-G Ratio 0.5 (*) 0.8 - 1.7     LIPASE    Collection Time     03/28/14 11:20 PM       Result Value Ref Range  Lipase 503 (*) 73 - 393 U/L       No orders to display        MDM  Number of Diagnoses or Management Options     Amount and/or Complexity of Data Reviewed  Clinical lab tests: ordered and reviewed  Review and summarize past medical records: yes      MEDICATIONS GIVEN:  Medications   sodium chloride 0.9 % bolus infusion 500 mL (not administered)   morphine injection 4 mg (not administered)   ondansetron (ZOFRAN) injection 4 mg (not administered)        Procedures    PROGRESS NOTE:  11:07 PM   Initial assessment performed.  Recorded by Harold Barban, as dictated by Ritta Slot, DO.     Discharge Note:  Tyler Ballard's   results have been reviewed with him.  He has been counseled regarding his diagnosis, treatment, and plan.  He verbally conveys understanding and agreement of the signs, symptoms, diagnosis, treatment and prognosis and additionally agrees to follow up as discussed.  He also agrees with the care-plan and conveys that all of his questions have been answered.  I have also provided discharge instructions for him that include: educational information regarding their diagnosis and treatment, and list of reasons why they would want to return to the ED prior to their follow-up appointment, should his condition change.      CLINICAL IMPRESSION    No diagnosis found.    After Visit Plan:  D/c home    Current Discharge Medication List      CONTINUE these medications which have NOT CHANGED    Details   cloNIDine HCl (CATAPRES) 0.1 mg tablet Take 1 tablet by mouth two (2) times a day.  Qty: 60 tablet, Refills: 0      lisinopril (PRINIVIL, ZESTRIL) 10 mg tablet Take 1 tablet by mouth daily.  Qty: 30 tablet, Refills: 0      famotidine (PEPCID) 20 mg tablet Take 1 tablet by mouth daily.  Qty: 30 tablet, Refills: 0      epoetin alfa (EPOGEN) 10,000 unit/mL injection by SubCUTAneous route once.      calcium acetate (PHOSLO) 667 mg cap Take  by mouth three (3) times daily (with meals).              Follow-up Information    None           Recorded by Grace Blight, ED Scribe, as dictated by Ritta Slot, DO

## 2014-03-28 NOTE — ED Notes (Signed)
Pt told rad tech to come back that he was waiting for iv to be started

## 2014-03-29 LAB — CBC WITH AUTOMATED DIFF
ABS. BASOPHILS: 0 10*3/uL (ref 0.0–0.06)
ABS. EOSINOPHILS: 0.1 10*3/uL (ref 0.0–0.4)
ABS. LYMPHOCYTES: 1.1 10*3/uL (ref 0.9–3.6)
ABS. MONOCYTES: 0.4 10*3/uL (ref 0.05–1.2)
ABS. NEUTROPHILS: 2.1 10*3/uL (ref 1.8–8.0)
BASOPHILS: 0 % (ref 0–2)
EOSINOPHILS: 3 % (ref 0–5)
HCT: 34.5 % — ABNORMAL LOW (ref 36.0–48.0)
HGB: 11.3 g/dL — ABNORMAL LOW (ref 13.0–16.0)
LYMPHOCYTES: 29 % (ref 21–52)
MCH: 31.8 PG (ref 24.0–34.0)
MCHC: 32.8 g/dL (ref 31.0–37.0)
MCV: 97.2 FL — ABNORMAL HIGH (ref 74.0–97.0)
MONOCYTES: 12 % — ABNORMAL HIGH (ref 3–10)
MPV: 10.1 FL (ref 9.2–11.8)
NEUTROPHILS: 56 % (ref 40–73)
PLATELET: 134 10*3/uL — ABNORMAL LOW (ref 135–420)
RBC: 3.55 M/uL — ABNORMAL LOW (ref 4.70–5.50)
RDW: 15.1 % — ABNORMAL HIGH (ref 11.6–14.5)
WBC: 3.7 10*3/uL — ABNORMAL LOW (ref 4.6–13.2)

## 2014-03-29 LAB — METABOLIC PANEL, COMPREHENSIVE
A-G Ratio: 0.5 — ABNORMAL LOW (ref 0.8–1.7)
ALT (SGPT): 39 U/L (ref 16–61)
AST (SGOT): 32 U/L (ref 15–37)
Albumin: 3 g/dL — ABNORMAL LOW (ref 3.4–5.0)
Alk. phosphatase: 227 U/L — ABNORMAL HIGH (ref 45–117)
Anion gap: 13 mmol/L (ref 3.0–18)
BUN/Creatinine ratio: 6 — ABNORMAL LOW (ref 12–20)
BUN: 61 MG/DL — ABNORMAL HIGH (ref 7.0–18)
Bilirubin, total: 0.3 MG/DL (ref 0.2–1.0)
CO2: 30 mmol/L (ref 21–32)
Calcium: 8.6 MG/DL (ref 8.5–10.1)
Chloride: 101 mmol/L (ref 100–108)
Creatinine: 10.43 MG/DL — ABNORMAL HIGH (ref 0.6–1.3)
GFR est AA: 7 mL/min/{1.73_m2} — ABNORMAL LOW (ref >60)
GFR est non-AA: 6 mL/min/{1.73_m2} — ABNORMAL LOW (ref >60)
Globulin: 6.4 g/dL — ABNORMAL HIGH (ref 2.0–4.0)
Glucose: 168 mg/dL — ABNORMAL HIGH (ref 74–99)
Potassium: 4.3 mmol/L (ref 3.5–5.5)
Protein, total: 9.4 g/dL — ABNORMAL HIGH (ref 6.4–8.2)
Sodium: 144 mmol/L (ref 136–145)

## 2014-03-29 LAB — LIPASE: Lipase: 503 U/L — ABNORMAL HIGH (ref 73–393)

## 2014-03-29 MED ORDER — MORPHINE 4 MG/ML SYRINGE
4 mg/mL | INTRAMUSCULAR | Status: DC
Start: 2014-03-29 — End: 2014-03-29

## 2014-03-29 MED ORDER — ONDANSETRON (PF) 4 MG/2 ML INJECTION
4 mg/2 mL | INTRAMUSCULAR | Status: DC
Start: 2014-03-29 — End: 2014-03-29

## 2014-03-29 MED FILL — SODIUM CHLORIDE 0.9 % IV: INTRAVENOUS | Qty: 500

## 2014-03-29 NOTE — ED Notes (Signed)
Pt signed out AMA, pt was not upset, pleasant, calm; stated it wasn't a fault of staff, just were not able to obtain iv access and give medications

## 2014-03-29 NOTE — ED Provider Notes (Signed)
[facility]  EMERGENCY DEPARTMENT TREATMENT REPORT  NAME:  Tyler Ballard  SEX:   M  ADMIT: 03/29/2014  DOB:   [birth year]  MR#    [id number]  ROOM:    TIME DICTATED: 08 25 PM  ACCT#  [id number]        CHIEF COMPLAINT:  Nausea, vomiting, sickle cell pain.    HISTORY OF PRESENT ILLNESS:  This is a 33 year old sickle cell patient who has a past medical history of  HIV, history of  hemodialysis, hypertension, sickle cell anemia, history of  chronic pancreatitis comes in complaining of his sickle cell pain with some  nausea.  He states that all his joints are bothering him.  He denies any  abdominal pain, just has a couple episodes of vomiting.  He reports that his  dialysis was yesterday.  He does have hypertension and HIV and he does not  take medicine for that as well.  He denies any diarrhea.  He has no chest  pain, no shortness of breath, no pleuritic pain, no fevers.    REVIEW OF SYSTEMS:  CONSTITUTIONAL:  No fever, chills, or weight loss.   EYES:  No visual symptoms.   ENT:  No sore throat, runny nose, or other URI symptoms.   ENDOCRINE:  No diabetic symptoms.   HEMATOLOGIC/LYMPHATIC:   No excessive bruising or lymph node swelling.   ALLERGIC/IMMUNOLOGIC:  No urticaria or allergy symptoms.   RESPIRATORY:  No cough, shortness of breath, or wheezing.   CARDIOVASCULAR:  No chest pain, chest pressure, or palpitations.   GASTROINTESTINAL: Nausea and vomiting.  GENITOURINARY:  No dysuria, frequency, or urgency.   MUSCULOSKELETAL: Joint pain.  INTEGUMENTARY: No rashes   NEUROLOGICAL:  No headaches, sensory or motor symptoms.     PAST MEDICAL HISTORY:  End-stage renal disease on hemodialysis, chronic pancreatitis, hypertension,  HIV. Medication noncompliant for his HIV and his hypertension.    FAMILY HISTORY:  Cancer, sickle cell anemia, hypertension.    SOCIAL HISTORY:  Does not drink or smoke.    ALLERGIES:  SEE IBEX.    MEDICATIONS:  See Ibex.    PHYSICAL EXAMINATION:   VITAL SIGNS:  138/82, 78, 18, 97.6, pain scale 8 out of 10, sats of 100%.  GENERAL APPEARANCE:  Patient appears well developed and well nourished.  Appearance and behavior are age and situation appropriate.   HEENT: Head is normocephalic, atraumatic.  NECK:  Supple, nontender, symmetrical, no masses or JVD, trachea midline,  thyroid not enlarged, nodular, or tender.   RESPIRATORY:  Clear and equal breath sounds.  No respiratory distress,  tachypnea, or accessory muscle use.     CARDIOVASCULAR: Heart regular rate and rhythm without any rubs, murmurs,  gallops or thrills.    GI:  Abdomen soft, nontender, without complaint of pain to palpation.  No  hepatomegaly or splenomegaly.   SKIN:  Warm and dry without rashes.   NEUROLOGIC:  Alert, oriented.  Sensation intact, motor strength equal and  symmetric.     ASSESSMENT AND MANAGEMENT PLAN:  This is a 33 year old male with history as mentioned above of sickle cell  anemia, acute renal failure, hypertension, HIV medication noncompliance, comes  in complaining of nausea, vomiting and sickle cell pain.  We will go ahead and  do a CBC, CMP and lipase, give him some fluids, give 1 mg Dilaudid, 25 of  Phenergan, 25 of Benadryl and reevaluate him afterwards.  The patient's labs  came back.  Chest x-ray showed no acute cardiopulmonary  disease, read by Dr.  Floyce Stakesodd Hannahgrace Lalli.  CMP came back with a creatinine of 10.4, BUN 17, glucose 111,  alkaline phosphatase 228, total protein of 8.4, which was pretty consistent  with [facility] a couple of days ago.  Lipase was 550 slightly  elevated. Retic count was 1.6.  Retic number 0.05. H&H 11 and 35.  Platelets  were normal.  WBC 30.4.  The patient received another mg of Dilaudid, another  25 mg of Benadryl and felt better afterwards, was drinking p.o. fluids.    FINAL DIAGNOSES:  1.  Acute sickle cell crisis.  2.  End-stage renal disease on hemodialysis.  3.  Human immunodeficiency virus (HIV), medication noncompliant.     The patient was personally evaluated by myself and Dr. Floyce Stakesodd Jonnatan Hanners who agrees  with the above assessment and plan.       ADDENDUM BY:  Floyce Stakesodd Satrina Magallanes, MD (04/08/2014)    I interviewed and examined the patient.  I discussed with the mid-level  provider and agree with their evaluation and plan as documented here.     This is a 33 year old male with a known history of sickle cell disease. He  comes in with an acute sickle cell pain crisis.  He is HIV.  We did treat him  symptomatically in the department.  He was feeling much better.  We did give  him pain medications and IV fluids.  His white count is elevated at 30,000,  but otherwise there is no evidence of serious bacterial infection.  Lipase is  mildly elevated.  He is comfortable with outpatient treatment right now.  He  does feel much better.   We did review his labs compared to [facility] and his labs are at baseline where he normally is at [facility].  Hemoglobin and hematocrit are stable.      ___________________  Johny Drillingodd A Brynley Cuddeback MD  Dictated By: Hilaria OtaNelson Santiago, PA-C    My signature above authenticates this document and my orders, the final  diagnosis (es), discharge prescription (s), and instructions in the [facility]  Pul[facility]heck record.  Nursing notes have been reviewed by the physician/mid-level provider.    If you have any questions please contact [phone].      D:03/29/2014 20:25:51  T: 03/29/2014 09:81:1920:57:04  [id number]  Electronically Authenticated by:  Johny Drillingodd A. Cooper Stamp, M.D. On 04/12/2014 04:03 PM EDT

## 2014-03-29 NOTE — ED Notes (Signed)
Pt on phone and told rad tech he may not do test, pt asking this nurse for address, asked pt if he is calling taxi he states hold on a minute, informed pt that rad tech is waiting he said not doing test;

## 2014-05-15 ENCOUNTER — Emergency Department: Payer: Self-pay | Admitting: Emergency Medicine

## 2014-05-15 LAB — COMPREHENSIVE METABOLIC PANEL
ALT: 15 U/L (ref 12–78)
AST: 25 U/L (ref 15–37)
Albumin: 2.7 g/dL — ABNORMAL LOW (ref 3.4–5.0)
Alkaline Phosphatase: 160 U/L — ABNORMAL HIGH
Anion Gap: 10 (ref 7–16)
BUN: 59 mg/dL — ABNORMAL HIGH (ref 7–18)
Bilirubin,Total: 0.4 mg/dL (ref 0.2–1.0)
CO2: 24 mmol/L (ref 21–32)
Calcium, Total: 8.3 mg/dL — ABNORMAL LOW (ref 8.5–10.1)
Chloride: 101 mmol/L (ref 98–107)
Creatinine: 10.05 mg/dL — ABNORMAL HIGH (ref 0.60–1.30)
EGFR (African American): 7 — ABNORMAL LOW
EGFR (Non-African Amer.): 6 — ABNORMAL LOW
Glucose: 159 mg/dL — ABNORMAL HIGH (ref 65–99)
Osmolality: 290 (ref 275–301)
POTASSIUM: 4 mmol/L (ref 3.5–5.1)
Sodium: 135 mmol/L — ABNORMAL LOW (ref 136–145)
Total Protein: 9.3 g/dL — ABNORMAL HIGH (ref 6.4–8.2)

## 2014-05-15 LAB — CBC WITH DIFFERENTIAL/PLATELET
Basophil #: 0 10*3/uL (ref 0.0–0.1)
Basophil %: 0.6 %
EOS ABS: 0.1 10*3/uL (ref 0.0–0.7)
Eosinophil %: 3.9 %
HCT: 23.9 % — AB (ref 40.0–52.0)
HGB: 7.7 g/dL — AB (ref 13.0–18.0)
LYMPHS ABS: 0.7 10*3/uL — AB (ref 1.0–3.6)
LYMPHS PCT: 18.2 %
MCH: 29.7 pg (ref 26.0–34.0)
MCHC: 32.3 g/dL (ref 32.0–36.0)
MCV: 92 fL (ref 80–100)
MONOS PCT: 16.1 %
Monocyte #: 0.6 x10 3/mm (ref 0.2–1.0)
Neutrophil #: 2.3 10*3/uL (ref 1.4–6.5)
Neutrophil %: 61.2 %
PLATELETS: 174 10*3/uL (ref 150–440)
RBC: 2.59 10*6/uL — ABNORMAL LOW (ref 4.40–5.90)
RDW: 16.4 % — AB (ref 11.5–14.5)
WBC: 3.7 10*3/uL — AB (ref 3.8–10.6)

## 2014-05-15 LAB — RETICULOCYTES
Absolute Retic Count: 0.0217 10*6/uL (ref 0.019–0.186)
Reticulocyte: 0.84 % (ref 0.4–3.1)

## 2014-05-21 ENCOUNTER — Emergency Department: Payer: Self-pay | Admitting: Emergency Medicine

## 2014-05-21 LAB — COMPREHENSIVE METABOLIC PANEL
ALK PHOS: 178 U/L — AB
AST: 16 U/L (ref 15–37)
Albumin: 2.5 g/dL — ABNORMAL LOW (ref 3.4–5.0)
Anion Gap: 10 (ref 7–16)
BILIRUBIN TOTAL: 0.4 mg/dL (ref 0.2–1.0)
BUN: 52 mg/dL — ABNORMAL HIGH (ref 7–18)
CHLORIDE: 101 mmol/L (ref 98–107)
Calcium, Total: 8.2 mg/dL — ABNORMAL LOW (ref 8.5–10.1)
Co2: 27 mmol/L (ref 21–32)
Creatinine: 11.93 mg/dL — ABNORMAL HIGH (ref 0.60–1.30)
EGFR (African American): 6 — ABNORMAL LOW
GFR CALC NON AF AMER: 5 — AB
GLUCOSE: 93 mg/dL (ref 65–99)
OSMOLALITY: 289 (ref 275–301)
POTASSIUM: 4.4 mmol/L (ref 3.5–5.1)
SGPT (ALT): 11 U/L — ABNORMAL LOW (ref 12–78)
SODIUM: 138 mmol/L (ref 136–145)
Total Protein: 8.9 g/dL — ABNORMAL HIGH (ref 6.4–8.2)

## 2014-05-21 LAB — RETICULOCYTES
Absolute Retic Count: 0.036 10*6/uL (ref 0.019–0.186)
Reticulocyte: 1.4 % (ref 0.4–3.1)

## 2014-05-21 LAB — CBC WITH DIFFERENTIAL/PLATELET
BASOS PCT: 0.6 %
Basophil #: 0 10*3/uL (ref 0.0–0.1)
Eosinophil #: 0.2 10*3/uL (ref 0.0–0.7)
Eosinophil %: 5 %
HCT: 24.5 % — ABNORMAL LOW (ref 40.0–52.0)
HGB: 8 g/dL — ABNORMAL LOW (ref 13.0–18.0)
Lymphocyte #: 0.7 10*3/uL — ABNORMAL LOW (ref 1.0–3.6)
Lymphocyte %: 22.5 %
MCH: 29.8 pg (ref 26.0–34.0)
MCHC: 32.4 g/dL (ref 32.0–36.0)
MCV: 92 fL (ref 80–100)
Monocyte #: 0.4 x10 3/mm (ref 0.2–1.0)
Monocyte %: 11 %
NEUTROS PCT: 60.9 %
Neutrophil #: 2 10*3/uL (ref 1.4–6.5)
PLATELETS: 182 10*3/uL (ref 150–440)
RBC: 2.67 10*6/uL — ABNORMAL LOW (ref 4.40–5.90)
RDW: 16.3 % — AB (ref 11.5–14.5)
WBC: 3.3 10*3/uL — ABNORMAL LOW (ref 3.8–10.6)

## 2014-05-21 LAB — SEDIMENTATION RATE: Erythrocyte Sed Rate: >140 mm/hr — ABNORMAL HIGH (ref 0–15)

## 2014-05-21 LAB — TROPONIN I: Troponin-I: <0.02 ng/mL

## 2014-05-29 ENCOUNTER — Emergency Department: Payer: Self-pay | Admitting: Emergency Medicine

## 2014-05-29 LAB — LIPASE, BLOOD: Lipase: 570 U/L — ABNORMAL HIGH (ref 73–393)

## 2014-05-29 LAB — DIFFERENTIAL
Basophil #: 0 10*3/uL (ref 0.0–0.1)
Basophil %: 0.9 %
EOS PCT: 5.4 %
Eosinophil #: 0.1 10*3/uL (ref 0.0–0.7)
LYMPHS ABS: 0.4 10*3/uL — AB (ref 1.0–3.6)
LYMPHS PCT: 19.3 %
Monocyte #: 0.3 x10 3/mm (ref 0.2–1.0)
Monocyte %: 15.2 %
NEUTROS ABS: 1.2 10*3/uL — AB (ref 1.4–6.5)
Neutrophil %: 59.2 %

## 2014-05-29 LAB — COMPREHENSIVE METABOLIC PANEL
ALT: 12 U/L (ref 12–78)
Albumin: 2.9 g/dL — ABNORMAL LOW (ref 3.4–5.0)
Alkaline Phosphatase: 189 U/L — ABNORMAL HIGH
Anion Gap: 9 (ref 7–16)
BUN: 42 mg/dL — ABNORMAL HIGH (ref 7–18)
Bilirubin,Total: 0.4 mg/dL (ref 0.2–1.0)
CALCIUM: 9.1 mg/dL (ref 8.5–10.1)
Chloride: 95 mmol/L — ABNORMAL LOW (ref 98–107)
Co2: 30 mmol/L (ref 21–32)
Creatinine: 8.84 mg/dL — ABNORMAL HIGH (ref 0.60–1.30)
EGFR (African American): 8 — ABNORMAL LOW
EGFR (Non-African Amer.): 7 — ABNORMAL LOW
GLUCOSE: 94 mg/dL (ref 65–99)
Osmolality: 278 (ref 275–301)
Potassium: 4.4 mmol/L (ref 3.5–5.1)
SGOT(AST): 17 U/L (ref 15–37)
Sodium: 134 mmol/L — ABNORMAL LOW (ref 136–145)
TOTAL PROTEIN: 9.3 g/dL — AB (ref 6.4–8.2)

## 2014-05-29 LAB — RETICULOCYTES
Absolute Retic Count: 0.0454 10*6/uL (ref 0.019–0.186)
Reticulocyte: 1.62 % (ref 0.4–3.1)

## 2014-05-29 LAB — CBC
HCT: 26.1 % — AB (ref 40.0–52.0)
HGB: 8.2 g/dL — AB (ref 13.0–18.0)
MCH: 29.4 pg (ref 26.0–34.0)
MCHC: 31.6 g/dL — AB (ref 32.0–36.0)
MCV: 93 fL (ref 80–100)
Platelet: 132 10*3/uL — ABNORMAL LOW (ref 150–440)
RBC: 2.81 10*6/uL — ABNORMAL LOW (ref 4.40–5.90)
RDW: 17.3 % — ABNORMAL HIGH (ref 11.5–14.5)
WBC: 2 10*3/uL — AB (ref 3.8–10.6)

## 2014-06-03 ENCOUNTER — Other Ambulatory Visit: Payer: Self-pay

## 2014-06-03 ENCOUNTER — Encounter (HOSPITAL_COMMUNITY): Payer: Self-pay | Admitting: Emergency Medicine

## 2014-06-03 ENCOUNTER — Emergency Department: Payer: Self-pay | Admitting: Emergency Medicine

## 2014-06-03 ENCOUNTER — Emergency Department (HOSPITAL_COMMUNITY): Payer: Medicare Other

## 2014-06-03 ENCOUNTER — Emergency Department (HOSPITAL_COMMUNITY)
Admission: EM | Admit: 2014-06-03 | Discharge: 2014-06-03 | Discharge status: Against Medical Advice | Payer: Medicare Other | Attending: Emergency Medicine | Admitting: Emergency Medicine

## 2014-06-03 DIAGNOSIS — F17200 Nicotine dependence, unspecified, uncomplicated: Secondary | ICD-10-CM | POA: Insufficient documentation

## 2014-06-03 DIAGNOSIS — Z992 Dependence on renal dialysis: Secondary | ICD-10-CM | POA: Insufficient documentation

## 2014-06-03 DIAGNOSIS — I120 Hypertensive chronic kidney disease with stage 5 chronic kidney disease or end stage renal disease: Secondary | ICD-10-CM | POA: Insufficient documentation

## 2014-06-03 DIAGNOSIS — D5700 Hb-SS disease with crisis, unspecified: Secondary | ICD-10-CM | POA: Diagnosis present

## 2014-06-03 DIAGNOSIS — J189 Pneumonia, unspecified organism: Secondary | ICD-10-CM

## 2014-06-03 DIAGNOSIS — R112 Nausea with vomiting, unspecified: Secondary | ICD-10-CM | POA: Insufficient documentation

## 2014-06-03 DIAGNOSIS — E875 Hyperkalemia: Secondary | ICD-10-CM | POA: Diagnosis not present

## 2014-06-03 DIAGNOSIS — J159 Unspecified bacterial pneumonia: Secondary | ICD-10-CM | POA: Diagnosis not present

## 2014-06-03 DIAGNOSIS — D571 Sickle-cell disease without crisis: Secondary | ICD-10-CM | POA: Insufficient documentation

## 2014-06-03 DIAGNOSIS — Z79899 Other long term (current) drug therapy: Secondary | ICD-10-CM | POA: Diagnosis not present

## 2014-06-03 DIAGNOSIS — N186 End stage renal disease: Secondary | ICD-10-CM | POA: Insufficient documentation

## 2014-06-03 HISTORY — DX: Dependence on renal dialysis: Z99.2

## 2014-06-03 HISTORY — DX: Dependence on renal dialysis: N18.6

## 2014-06-03 LAB — RETICULOCYTES
Absolute Retic Count: 0.0565 10*6/uL (ref 0.019–0.186)
RBC.: 2.62 MIL/uL — AB (ref 4.22–5.81)
RETIC COUNT ABSOLUTE: 41.9 10*3/uL (ref 19.0–186.0)
RETIC CT PCT: 1.6 % (ref 0.4–3.1)
RETICULOCYTE: 2.02 % (ref 0.4–3.1)

## 2014-06-03 LAB — CBC WITH DIFFERENTIAL/PLATELET
Basophils Absolute: 0 10*3/uL (ref 0.0–0.1)
Basophils Relative: 1 % (ref 0–1)
EOS ABS: 0.1 10*3/uL (ref 0.0–0.7)
Eosinophils Relative: 3 % (ref 0–5)
HCT: 24.4 % — ABNORMAL LOW (ref 39.0–52.0)
HEMOGLOBIN: 7.6 g/dL — AB (ref 13.0–17.0)
Lymphocytes Relative: 23 % (ref 12–46)
Lymphs Abs: 0.6 10*3/uL — ABNORMAL LOW (ref 0.7–4.0)
MCH: 29 pg (ref 26.0–34.0)
MCHC: 31.1 g/dL (ref 30.0–36.0)
MCV: 93.1 fL (ref 78.0–100.0)
MONOS PCT: 10 % (ref 3–12)
Monocytes Absolute: 0.3 10*3/uL (ref 0.1–1.0)
NEUTROS ABS: 1.8 10*3/uL (ref 1.7–7.7)
NEUTROS PCT: 63 % (ref 43–77)
PLATELETS: 123 10*3/uL — AB (ref 150–400)
RBC: 2.62 MIL/uL — ABNORMAL LOW (ref 4.22–5.81)
RDW: 16.8 % — ABNORMAL HIGH (ref 11.5–15.5)
WBC: 2.8 10*3/uL — ABNORMAL LOW (ref 4.0–10.5)

## 2014-06-03 LAB — COMPREHENSIVE METABOLIC PANEL
ALBUMIN: 2.9 g/dL — AB (ref 3.4–5.0)
ALBUMIN: 2.8 g/dL — AB (ref 3.5–5.2)
ALT: 13 U/L (ref 12–78)
ALT: 9 U/L (ref 0–53)
ANION GAP: 10 (ref 7–16)
AST: 25 U/L (ref 15–37)
AST: 17 U/L (ref 0–37)
Alkaline Phosphatase: 213 U/L — ABNORMAL HIGH
Alkaline Phosphatase: 179 U/L — ABNORMAL HIGH (ref 39–117)
BILIRUBIN TOTAL: 0.5 mg/dL (ref 0.2–1.0)
BUN: 79 mg/dL — AB (ref 7–18)
BUN: 80 mg/dL — AB (ref 6–23)
CO2: 21 mEq/L (ref 19–32)
CREATININE: 13.97 mg/dL — AB (ref 0.50–1.35)
Calcium, Total: 8.3 mg/dL — ABNORMAL LOW (ref 8.5–10.1)
Calcium: 8.4 mg/dL (ref 8.4–10.5)
Chloride: 100 mmol/L (ref 98–107)
Chloride: 97 mEq/L (ref 96–112)
Co2: 23 mmol/L (ref 21–32)
Creatinine: 14.32 mg/dL — ABNORMAL HIGH (ref 0.60–1.30)
EGFR (African American): 5 — ABNORMAL LOW
EGFR (Non-African Amer.): 4 — ABNORMAL LOW
GFR calc Af Amer: 5 mL/min — ABNORMAL LOW (ref >90)
GFR calc non Af Amer: 4 mL/min — ABNORMAL LOW (ref >90)
Glucose, Bld: 101 mg/dL — ABNORMAL HIGH (ref 70–99)
Glucose: 93 mg/dL (ref 65–99)
Osmolality: 290 (ref 275–301)
Potassium: 5.2 mmol/L — ABNORMAL HIGH (ref 3.5–5.1)
Potassium: 5.7 mEq/L — ABNORMAL HIGH (ref 3.7–5.3)
SODIUM: 133 mmol/L — AB (ref 136–145)
Sodium: 138 mEq/L (ref 137–147)
TOTAL PROTEIN: 9.7 g/dL — AB (ref 6.4–8.2)
TOTAL PROTEIN: 8.8 g/dL — AB (ref 6.0–8.3)
Total Bilirubin: 0.3 mg/dL (ref 0.3–1.2)

## 2014-06-03 LAB — CBC
HCT: 26.1 % — AB (ref 40.0–52.0)
HGB: 8.4 g/dL — AB (ref 13.0–18.0)
MCH: 29.9 pg (ref 26.0–34.0)
MCHC: 32 g/dL (ref 32.0–36.0)
MCV: 93 fL (ref 80–100)
Platelet: 149 10*3/uL — ABNORMAL LOW (ref 150–440)
RBC: 2.8 10*6/uL — ABNORMAL LOW (ref 4.40–5.90)
RDW: 18.2 % — ABNORMAL HIGH (ref 11.5–14.5)
WBC: 3.6 10*3/uL — AB (ref 3.8–10.6)

## 2014-06-03 LAB — LIPASE, BLOOD: Lipase: 490 U/L — ABNORMAL HIGH (ref 73–393)

## 2014-06-03 LAB — AMYLASE: AMYLASE: 160 U/L — AB (ref 25–115)

## 2014-06-03 MED ORDER — PROMETHAZINE HCL 25 MG PO TABS
25.0000 mg | ORAL_TABLET | Freq: Once | ORAL | Status: AC
  Administered 2014-06-03: 25 mg via ORAL
  Filled 2014-06-03: qty 1

## 2014-06-03 MED ORDER — HYDROMORPHONE HCL PF 1 MG/ML IJ SOLN
2.0000 mg | INTRAMUSCULAR | Status: AC | PRN
  Administered 2014-06-03 (×2): 2 mg via INTRAVENOUS
  Filled 2014-06-03 (×2): qty 2

## 2014-06-03 MED ORDER — DIPHENHYDRAMINE HCL 25 MG PO CAPS
25.0000 mg | ORAL_CAPSULE | Freq: Once | ORAL | Status: AC
  Administered 2014-06-03: 25 mg via ORAL
  Filled 2014-06-03: qty 1

## 2014-06-03 MED ORDER — HYDROMORPHONE HCL PF 1 MG/ML IJ SOLN
1.0000 mg | Freq: Once | INTRAMUSCULAR | Status: AC
  Administered 2014-06-03: 1 mg via INTRAVENOUS
  Filled 2014-06-03: qty 1

## 2014-06-03 MED ORDER — ONDANSETRON HCL 4 MG/2ML IJ SOLN
4.0000 mg | INTRAMUSCULAR | Status: DC | PRN
  Administered 2014-06-03: 4 mg via INTRAVENOUS
  Filled 2014-06-03: qty 2

## 2014-06-03 MED ORDER — SODIUM POLYSTYRENE SULFONATE 15 GM/60ML PO SUSP
15.0000 g | Freq: Once | ORAL | Status: DC
  Filled 2014-06-03: qty 60

## 2014-06-03 MED ORDER — AMOXICILLIN-POT CLAVULANATE 875-125 MG PO TABS
ORAL_TABLET | ORAL | Status: DC
Start: 2014-06-03 — End: 2014-06-07

## 2014-06-03 MED ORDER — SODIUM CHLORIDE 0.9 % IV BOLUS (SEPSIS): 500.0000 mL | Freq: Once | INTRAVENOUS | Status: DC

## 2014-06-03 MED ORDER — VANCOMYCIN HCL IN DEXTROSE 1-5 GM/200ML-% IV SOLN: 1000.0000 mg | INTRAVENOUS | Status: DC

## 2014-06-03 MED ORDER — LEVOFLOXACIN 500 MG PO TABS: ORAL_TABLET | ORAL | Status: DC

## 2014-06-03 MED ORDER — LEVOFLOXACIN 500 MG PO TABS
500.0000 mg | ORAL_TABLET | Freq: Once | ORAL | Status: AC
  Administered 2014-06-03: 500 mg via ORAL
  Filled 2014-06-03: qty 1

## 2014-06-03 MED ORDER — DEXTROSE 5 % IV SOLN
1.0000 g | Freq: Once | INTRAVENOUS | Status: DC
  Filled 2014-06-03: qty 1

## 2014-06-03 MED ORDER — VANCOMYCIN HCL 10 G IV SOLR
1750.0000 mg | Freq: Once | INTRAVENOUS | Status: DC
  Filled 2014-06-03: qty 1750

## 2014-06-03 NOTE — ED Notes (Addendum)
Dr. Wickline at bedside.  

## 2014-06-03 NOTE — ED Provider Notes (Signed)
CSN: [id number]     Arrival date & time 06/03/14  0607 History   First MD Initiated Contact with Patient 06/03/14 [id number]     Chief Complaint  Patient presents with  . Sickle Cell Pain Crisis      Patient is a 33 y.o. male presenting with sickle cell pain. The history is provided by the patient.  Sickle Cell Pain Crisis Pain location: "back and joints" Severity:  Severe Onset quality:  Gradual Duration:  2 days Similar to previous crisis episodes: yes   Timing:  Constant Progression:  Worsening Chronicity:  Recurrent Relieved by:  Prescription drugs Worsened by:  Nothing tried Associated symptoms: cough, nausea and vomiting   Associated symptoms: no chest pain, no fever, no headaches and no shortness of breath   Pt presents for sickle cell pain crisis He reports h/o [place] disease, uses home dilaudid but he has been vomiting for past 2 days so he is unable to tolerate medications.   He is a dialysis patient, but has not missed any sessions (reports he receives HD at home) He is in process of moving and does not live locally.   Past Medical History  Diagnosis Date  . Hypertension   . Sickle cell anemia   . ESRD on hemodialysis    History reviewed. No pertinent past surgical history. No family history on file. History  Substance Use Topics  . Smoking status: Never Smoker   . Smokeless tobacco: Not on file  . Alcohol Use: No    Review of Systems  Constitutional: Negative for fever.  Respiratory: Positive for cough. Negative for shortness of breath.   Cardiovascular: Negative for chest pain.  Gastrointestinal: Positive for nausea and vomiting.  Musculoskeletal: Positive for arthralgias and back pain.  Neurological: Negative for headaches.  All other systems reviewed and are negative.     Allergies  Review of patient's allergies indicates no known allergies.  Home Medications   Prior to Admission medications   Medication Sig Start Date End Date Taking? Authorizing  Provider  amLODipine (NORVASC) 5 MG tablet Take 5 mg by mouth daily.   Yes Historical Provider, MD  b complex-vitamin c-folic acid (NEPHRO-VITE) 0.8 MG TABS tablet Take 1 tablet by mouth at bedtime.   Yes Historical Provider, MD  diphenhydrAMINE (BENADRYL) 25 mg capsule Take 25 mg by mouth every 6 (six) hours as needed for allergies.   Yes Historical Provider, MD  folic acid (FOLVITE) 1 MG tablet Take 1 mg by mouth daily.   Yes Historical Provider, MD  HYDROmorphone (DILAUDID) 8 MG tablet Take 16 mg by mouth every 4 (four) hours as needed for severe pain.   Yes Historical Provider, MD  hydroxyurea (HYDREA) 500 MG capsule Take 500 mg by mouth 2 (two) times daily. May take with food to minimize GI side effects.   Yes Historical Provider, MD  lisinopril (PRINIVIL,ZESTRIL) 10 MG tablet Take 10 mg by mouth 2 (two) times daily.   Yes Historical Provider, MD   BP 140/79  Pulse 123  Temp(Src) 98 F (36.7 C) (Oral)  Resp 18  Ht 5\' 7"  (1.702 m)  Wt 190 lb (86.183 kg)  BMI 29.75 kg/m2  SpO2 95% Physical Exam CONSTITUTIONAL: chronically ill appearing HEAD: Normocephalic/atraumatic EYES: EOMI ENMT: Mucous membranes moist NECK: supple no meningeal signs SPINE:entire spine nontender CV: tachycardic, S1/S2 noted, no murmurs/rubs/gallops noted LUNGS: Lungs are clear to auscultation bilaterally, no apparent distress ABDOMEN: soft, nontender, no rebound or guarding NEURO: Pt is awake/alert, moves all extremitiesx4  EXTREMITIES: pulses normal, full ROM, dialysis access to left UE - thrill noted Tenderness to both knees but no joint effusion noted.  No tenderness to palpation of feet/hands SKIN: warm, color normal PSYCH: no abnormalities of mood noted  ED Course  Procedures  8:13 AM Pt with h/o [place] disease here with pain crisis and vomiting Will treat pain and reassess CXR appears c/w pneumonia, antibiotics ordered Pt clinically stable at this time 8:48 AM hyperKalemia noted Kayexalate  ordered Advised admission to patient 9:41 AM Pt refuses admission, reports he has to go to funeral for family member Will need antibiotics-  Spoke to pharmacy and will give levaquin every other day (pt on dialysis) Advised need for outpatient dialysis We discussed strict return precautions  I discussed risk of death/disability of leaving against medical advice and the patient accepts these risks.  The patient is awake/alert able to make decisions. Patient discharged against medical advice.   BP 135/76  Pulse 109  Temp(Src) 98 F (36.7 C) (Oral)  Resp 13  Ht 5\' 7"  (1.702 m)  Wt 190 lb (86.183 kg)  BMI 29.75 kg/m2  SpO2 99%   Labs Review Labs Reviewed  CBC WITH DIFFERENTIAL - Abnormal; Notable for the following:    WBC 2.8 (*)    RBC 2.62 (*)    Hemoglobin 7.6 (*)    HCT 24.4 (*)    RDW 16.8 (*)    Platelets 123 (*)    Lymphs Abs 0.6 (*)    All other components within normal limits  RETICULOCYTES - Abnormal; Notable for the following:    RBC. 2.62 (*)    All other components within normal limits  COMPREHENSIVE METABOLIC PANEL    Imaging Review Dg Chest Portable 1 View  06/03/2014   CLINICAL DATA:  Sickle cell disease with pain  EXAM: PORTABLE CHEST - 1 VIEW  COMPARISON:  None.  FINDINGS: There is focal atelectatic change in the left mid lung. There is cardiomegaly with mild interstitial prominence as well as mild pulmonary venous hypertension. There is no appreciable adenopathy. There are subclavian stents bilaterally; this stent on the left also involves the axillary and brachial vein regions.  IMPRESSION: Suspect mild congestive heart failure. Atelectatic change is noted in the left mid lung. This finding may represent early pneumonia.   Electronically Signed   By: Bretta BangWilliam  Woodruff M.D.   On: 06/03/2014 07:22     EKG Interpretation  Date/Time:  Tuesday June 03 2014 08:52:03 EDT Ventricular Rate:  103 PR Interval:  190 QRS Duration: 88 QT Interval:  395 QTC  Calculation: 517 R Axis:   7 Text Interpretation:  Sinus tachycardia Anterior infarct, old Prolonged QT interval Abnormal ekg No previous ECGs available Confirmed by Bebe ShaggyWICKLINE  MD, DONALD ([id number]) on 06/03/2014 8:59:04 AM        MDM   Final diagnoses:  HCAP (healthcare-associated pneumonia)  Sickle cell anemia  Hyperkalemia    Nursing notes including past medical history and social history reviewed and considered in documentation xrays reviewed and considered Labs/vital reviewed and considered     Joya Gaskinsonald W Wickline, MD 06/03/14 [phone]

## 2014-06-03 NOTE — ED Notes (Signed)
Patient refused to wear his cardiac monitor anymore

## 2014-06-03 NOTE — ED Notes (Signed)
Patient will be leaving against medical advice, risks and benefits of staying vs leaving have been explained to patient and he verbalizes understanding.  Patient has agreed to wait the 30 minutes required after administration of IV pain medication.  Have called pharmacy to expedite first dose of levaquin.

## 2014-06-03 NOTE — Progress Notes (Signed)
ANTIBIOTIC CONSULT NOTE - INITIAL  Pharmacy Consult:  Vancomycin Indication:  PNA  No Known Allergies  Patient Measurements: Height: 5\' 7"  (170.2 cm) Weight: 190 lb (86.183 kg) IBW/kg (Calculated) : 66.1  Vital Signs: Temp: 98 F (36.7 C) (06/16 0752) Temp src: Oral (06/16 0752) BP: 140/79 mmHg (06/16 0752) Pulse Rate: 123 (06/16 0616)  Labs:  Recent Labs  06/03/14 0753  WBC 2.8*  HGB 7.6*  PLT 123*   CrCl is unknown because no creatinine reading has been taken. No results found for this basename: VANCOTROUGH, VANCOPEAK, VANCORANDOM, GENTTROUGH, GENTPEAK, GENTRANDOM, TOBRATROUGH, TOBRAPEAK, TOBRARND, AMIKACINPEAK, AMIKACINTROU, AMIKACIN,  in the last 72 hours   Microbiology: No results found for this or any previous visit (from the past 720 hour(s)).  Medical History: Past Medical History  Diagnosis Date  . Hypertension   . Sickle cell anemia   . ESRD on hemodialysis      Assessment: [name] admitted with sickle cell pain crisis to start vancomycin for PNA.  Noted first dose of Cefepime ordered.  Patient has a history of ESRD on HD on MWF.   Goal of Therapy:  Vanc pre-HD level: 15-25 mcg/mL   Plan:  - Vanc 1750mg  IV x 1, then 1gm IV q-HD MWF x8 days total - Monitor HD tolerance/schedule - No plan for vanc level unless changes in clinical status - F/U with continuation of Gram negative coverage    Kritika Stukes D. Laney Potashang, PharmD, BCPS Pager:  [phone] 06/03/2014, 8:30 AM

## 2014-06-03 NOTE — ED Notes (Signed)
Pt. reports sickle cell crisis pain onset Sunday with generalized pain at joints and back with nausea and vomitting , pt. is on hemodialysis q Mon/Wed/Fri.

## 2014-06-03 NOTE — ED Provider Notes (Signed)
Qt prolonged on EKG levaquin cancelled Will place on augmentin for 10 days for pneumonia (pt with multiple lab abnormalities that limit medications) Pt agreeable with plan  Joya Gaskinsonald W Wickline, MD 06/03/14 1017

## 2014-06-05 ENCOUNTER — Emergency Department (HOSPITAL_COMMUNITY)
Admission: EM | Admit: 2014-06-05 | Discharge: 2014-06-05 | Disposition: A | Discharge status: Home / Self Care | Payer: Medicare Other | Attending: Emergency Medicine | Admitting: Emergency Medicine

## 2014-06-05 ENCOUNTER — Emergency Department (HOSPITAL_COMMUNITY): Payer: Medicare Other

## 2014-06-05 ENCOUNTER — Encounter (HOSPITAL_COMMUNITY): Payer: Self-pay | Admitting: Emergency Medicine

## 2014-06-05 DIAGNOSIS — Z7901 Long term (current) use of anticoagulants: Secondary | ICD-10-CM | POA: Insufficient documentation

## 2014-06-05 DIAGNOSIS — Z792 Long term (current) use of antibiotics: Secondary | ICD-10-CM | POA: Diagnosis not present

## 2014-06-05 DIAGNOSIS — I120 Hypertensive chronic kidney disease with stage 5 chronic kidney disease or end stage renal disease: Secondary | ICD-10-CM | POA: Diagnosis not present

## 2014-06-05 DIAGNOSIS — N186 End stage renal disease: Secondary | ICD-10-CM | POA: Insufficient documentation

## 2014-06-05 DIAGNOSIS — D571 Sickle-cell disease without crisis: Secondary | ICD-10-CM | POA: Insufficient documentation

## 2014-06-05 DIAGNOSIS — R1013 Epigastric pain: Secondary | ICD-10-CM | POA: Insufficient documentation

## 2014-06-05 DIAGNOSIS — Z79899 Other long term (current) drug therapy: Secondary | ICD-10-CM | POA: Diagnosis not present

## 2014-06-05 DIAGNOSIS — R112 Nausea with vomiting, unspecified: Secondary | ICD-10-CM | POA: Diagnosis present

## 2014-06-05 DIAGNOSIS — E875 Hyperkalemia: Secondary | ICD-10-CM | POA: Diagnosis not present

## 2014-06-05 DIAGNOSIS — Z992 Dependence on renal dialysis: Secondary | ICD-10-CM | POA: Insufficient documentation

## 2014-06-05 LAB — RETICULOCYTES
RBC.: 2.81 MIL/uL — ABNORMAL LOW (ref 4.22–5.81)
RETIC CT PCT: 1.2 % (ref 0.4–3.1)
Retic Count, Absolute: 33.7 10*3/uL (ref 19.0–186.0)

## 2014-06-05 LAB — LIPASE, BLOOD: LIPASE: 94 U/L — AB (ref 11–59)

## 2014-06-05 LAB — I-STAT CHEM 8, ED
BUN: 60 mg/dL — AB (ref 6–23)
CHLORIDE: 98 meq/L (ref 96–112)
CREATININE: 12.2 mg/dL — AB (ref 0.50–1.35)
Calcium, Ion: 1.01 mmol/L — ABNORMAL LOW (ref 1.12–1.23)
Glucose, Bld: 97 mg/dL (ref 70–99)
HCT: 29 % — ABNORMAL LOW (ref 39.0–52.0)
Hemoglobin: 9.9 g/dL — ABNORMAL LOW (ref 13.0–17.0)
POTASSIUM: 5.8 meq/L — AB (ref 3.7–5.3)
SODIUM: 135 meq/L — AB (ref 137–147)
TCO2: 25 mmol/L (ref 0–100)

## 2014-06-05 LAB — CBC WITH DIFFERENTIAL/PLATELET
Basophils Absolute: 0 10*3/uL (ref 0.0–0.1)
Basophils Relative: 0 % (ref 0–1)
EOS PCT: 4 % (ref 0–5)
Eosinophils Absolute: 0.1 10*3/uL (ref 0.0–0.7)
HEMATOCRIT: 26.2 % — AB (ref 39.0–52.0)
Hemoglobin: 8.2 g/dL — ABNORMAL LOW (ref 13.0–17.0)
LYMPHS ABS: 0.7 10*3/uL (ref 0.7–4.0)
Lymphocytes Relative: 27 % (ref 12–46)
MCH: 29.2 pg (ref 26.0–34.0)
MCHC: 31.3 g/dL (ref 30.0–36.0)
MCV: 93.2 fL (ref 78.0–100.0)
Monocytes Absolute: 0.3 10*3/uL (ref 0.1–1.0)
Monocytes Relative: 12 % (ref 3–12)
NEUTROS PCT: 57 % (ref 43–77)
Neutro Abs: 1.5 10*3/uL — ABNORMAL LOW (ref 1.7–7.7)
Platelets: 99 10*3/uL — ABNORMAL LOW (ref 150–400)
RBC: 2.81 MIL/uL — AB (ref 4.22–5.81)
RDW: 16.5 % — ABNORMAL HIGH (ref 11.5–15.5)
WBC: 2.6 10*3/uL — ABNORMAL LOW (ref 4.0–10.5)

## 2014-06-05 LAB — I-STAT CG4 LACTIC ACID, ED: Lactic Acid, Venous: 0.76 mmol/L (ref 0.5–2.2)

## 2014-06-05 MED ORDER — HYDROMORPHONE HCL PF 1 MG/ML IJ SOLN
2.0000 mg | INTRAMUSCULAR | Status: DC | PRN
  Administered 2014-06-05 (×3): 2 mg via INTRAVENOUS
  Filled 2014-06-05 (×3): qty 2

## 2014-06-05 MED ORDER — SODIUM CHLORIDE 0.9 % IV SOLN
1.0000 g | Freq: Once | INTRAVENOUS | Status: AC
  Administered 2014-06-05: 1 g via INTRAVENOUS
  Filled 2014-06-05: qty 10

## 2014-06-05 MED ORDER — ONDANSETRON HCL 4 MG/2ML IJ SOLN
4.0000 mg | Freq: Once | INTRAMUSCULAR | Status: AC
  Administered 2014-06-05: 4 mg via INTRAVENOUS
  Filled 2014-06-05: qty 2

## 2014-06-05 MED ORDER — PROMETHAZINE HCL 25 MG/ML IJ SOLN
12.5000 mg | Freq: Once | INTRAMUSCULAR | Status: AC
  Administered 2014-06-05: 12.5 mg via INTRAVENOUS
  Filled 2014-06-05 (×2): qty 1

## 2014-06-05 MED ORDER — SODIUM POLYSTYRENE SULFONATE 15 GM/60ML PO SUSP
30.0000 g | Freq: Once | ORAL | Status: AC
  Administered 2014-06-05: 30 g via ORAL
  Filled 2014-06-05: qty 120

## 2014-06-05 MED ORDER — DIPHENHYDRAMINE HCL 50 MG/ML IJ SOLN
50.0000 mg | Freq: Once | INTRAMUSCULAR | Status: AC
  Administered 2014-06-05: 50 mg via INTRAVENOUS
  Filled 2014-06-05: qty 1

## 2014-06-05 MED ORDER — SODIUM CHLORIDE 0.9 % IV SOLN
INTRAVENOUS | Status: DC
  Administered 2014-06-05: 75 mL/h via INTRAVENOUS

## 2014-06-05 MED ORDER — HYDROMORPHONE HCL PF 1 MG/ML IJ SOLN
1.0000 mg | Freq: Once | INTRAMUSCULAR | Status: AC
  Administered 2014-06-05: 1 mg via INTRAVENOUS
  Filled 2014-06-05: qty 1

## 2014-06-05 MED ORDER — PROMETHAZINE HCL 25 MG/ML IJ SOLN
12.5000 mg | Freq: Once | INTRAMUSCULAR | Status: AC
  Administered 2014-06-05: 12.5 mg via INTRAVENOUS
  Filled 2014-06-05: qty 1

## 2014-06-05 NOTE — ED Provider Notes (Signed)
Medical screening examination/treatment/procedure(s) were conducted as a shared visit with non-physician practitioner(s) and myself.  I personally evaluated the patient during the encounter.   EKG Interpretation   Date/Time:  Thursday June 05 2014 07:46:13 EDT Ventricular Rate:  77 PR Interval:  204 QRS Duration: 94 QT Interval:  459 QTC Calculation: 519 R Axis:   -26 Text Interpretation:  Sinus rhythm Borderline prolonged PR interval  Anterior infarct, old Prolonged QT interval Artifact Unchanged compared to  prior EKG Confirmed by WARD,  DO, KRISTEN ([id number]) on 06/05/2014 7:48:35 AM     Pt w/ cc of n/v. No chest pain, abd pain. Recent admission for HCAP - left AMA. Last dialysis y'day. Hx of sickle cell and esrd. No fevers, cough - labs show mild hyperK. Plan is to try and have patient admitted - and if he wants to leave, which i suspect he might, we will try po challenge. Abd is non tender, lung exam is clear.   Christopher KaplanAnkit Bessye Stith, MD 06/05/14 [phone]

## 2014-06-05 NOTE — ED Notes (Signed)
Pt resting in bed in position of comfort, NAD noted. Pt awake, alert. Sent for xray.

## 2014-06-05 NOTE — ED Notes (Addendum)
Patient stating that I haven't been giving him his scheduled pain medication. Pain states he's only had 2 of his 3 doses of pain medication. Explained to patient that he was scanned 3 times each time seperately for each dose of medication. No further orders received. Greta DoomBowie Pa-c aware. At bedside explaining we will admit patient if he continues to have pain. Patient states his pain is better doesn't want admission. Patient continues to make threats that he will be calling and complaining that his pain wasn't well treated here. Made every attempt possible to make patient comfortable. Patient continues to be hostile with staff.

## 2014-06-05 NOTE — ED Provider Notes (Signed)
CSN: [id number]     Arrival date & time 06/05/14  0407 History   First MD Initiated Contact with Patient 06/05/14 [phone]     Chief Complaint  Patient presents with  . Emesis  . Generalized Body Aches     (Consider location/radiation/quality/duration/timing/severity/associated sxs/prior Treatment) HPI 33 year old male with history of sickle cell anemia and is a dialysis pt who was seen 2 days ago for a sickle cell pain crisis of vomiting and was diagnosed with pneumonia. Patient left AMA because he has to attend a funeral.  He was made aware that his sxs could worsening and should return.  He was discharged with Augmentin to treat HCAP.  He is here again today reporting feeling nauseous, and has vomited multiple times, unable to tolerates his medication. Report having generalized body aches with joint pain similar to prior sickle cell crisis. Pt felt dehydrated. Report upper abd pain from vomiting, and sts he has hx of pancreatitis in the past, not relating to alcohol use or diabetes.  No report of fever, chills, headache, neck stiffness, chest pain, shortness of breath, productive cough, hemoptysis, back pain, or rash. He had his dialysis yesterday. He normally does not have sickle cell crisis, last crisis was last year. States he is compliant with his medication regimen and his sickle cell is controlled. He is from out of town, no PCP here.     Past Medical History  Diagnosis Date  . Hypertension   . Sickle cell anemia   . ESRD on hemodialysis    History reviewed. No pertinent past surgical history. No family history on file. History  Substance Use Topics  . Smoking status: Never Smoker   . Smokeless tobacco: Not on file  . Alcohol Use: No    Review of Systems  All other systems reviewed and are negative.     Allergies  Review of patient's allergies indicates no known allergies.  Home Medications   Prior to Admission medications   Medication Sig Start Date End Date Taking?  Authorizing Estanislao Harmon  amLODipine (NORVASC) 5 MG tablet Take 5 mg by mouth daily.    Historical Normand Damron, MD  amoxicillin-clavulanate (AUGMENTIN) 875-125 MG per tablet One po daily for 10 days 06/03/14   Joya Gaskinsonald W Wickline, MD  b complex-vitamin c-folic acid (NEPHRO-VITE) 0.8 MG TABS tablet Take 1 tablet by mouth at bedtime.    Historical Kimara Bencomo, MD  diphenhydrAMINE (BENADRYL) 25 mg capsule Take 25 mg by mouth every 6 (six) hours as needed for allergies.    Historical Ceri Mayer, MD  folic acid (FOLVITE) 1 MG tablet Take 1 mg by mouth daily.    Historical Juliocesar Blasius, MD  HYDROmorphone (DILAUDID) 8 MG tablet Take 16 mg by mouth every 4 (four) hours as needed for severe pain.    Historical Fedora Knisely, MD  hydroxyurea (HYDREA) 500 MG capsule Take 500 mg by mouth 2 (two) times daily. May take with food to minimize GI side effects.    Historical Kean Gautreau, MD  lisinopril (PRINIVIL,ZESTRIL) 10 MG tablet Take 10 mg by mouth 2 (two) times daily.    Historical Najeeb Uptain, MD   BP 164/89  Pulse 75  Temp(Src) 98.7 F (37.1 C) (Oral)  Resp 18  SpO2 95% Physical Exam  Constitutional: He is oriented to person, place, and time. He appears well-developed and well-nourished. No distress.  HENT:  Head: Atraumatic.  Mouth is dry  Eyes: Conjunctivae are normal.  Neck: Normal range of motion. Neck supple.  No nuchal rigidity  Cardiovascular: Normal rate  and regular rhythm.   Pulmonary/Chest: Effort normal and breath sounds normal.  Abdominal: Soft. There is tenderness (epigastric tenderness without guarding or rebound tenderness.  ).  Musculoskeletal: He exhibits edema (trace edema to BLE.  ).  Neurological: He is alert and oriented to person, place, and time.  Skin: Skin is dry. No rash noted.  AV fistula to L arm, with palpable thrills.    Psychiatric: He has a normal mood and affect.    ED Course  Procedures (including critical care time)  6:36 AM Patient presents with sickle cell related pain and recent  diagnosis of HCAP. This is concerning for acute chest syndrome if patient indeed has pneumonia. Patient is unable to tolerate his medication at home, due to nausea and vomiting. He does have epigastric tenderness on exam but a nonsurgical abdomen.  His primary complaint is n/v without diarrhea.  Hx of prolonged QT on EKG, which makes it difficult to control his sxs with antiemetic given the risk of prolonged QT leading to torsade de pointe.  Will pain manage, recheck CXR and provide a low dose of NS drip for dehydration.    6:44 AM Elevated K+ of 5.8, not much improvement from prior.  Will give calcium gluconate and Kayexalate.  Pt needs admission for further management.    8:06 AM Repeat chest x-ray did not demonstrate multifocal pneumonia. I have low suspicion for pneumonia given that patient has no fever and no productive cough. Patient prefers not to be admitted if possible. We'll continue to manage the symptoms. No peak T-wave changes on EKG. Does have a mildly elevated lipase of 94 to suggest early signs of pancreatitis. He has elevated K+.  Calcium gluconate and kayexalate given.  Care discussed with Dr. Rhunette CroftNanavati.  8:29 AM Pt request for benadryl/reglan/dilaudid.  sts that he has always receive this particular treatment for his sickkle cell related pain.  i spent moderate amount of time discussing about QT prolongation.  Pt aware of the risk and request treatment.  Will give medication and will closely monitor.    9:52 AM Patient felt better after receiving treatment. I offer admission for obs but He preferred to go home and follow up with his PCP for further management. He does his hemodialysis at home which I encourage outpatient hemodialysis due to elevated potassium. He is currently mentating appropriately. He understands return if his symptoms worsen. He is able to tolerate by mouth at this time. He is stable for discharge.  Labs Review Labs Reviewed  CBC WITH DIFFERENTIAL - Abnormal;  Notable for the following:    WBC 2.6 (*)    RBC 2.81 (*)    Hemoglobin 8.2 (*)    HCT 26.2 (*)    RDW 16.5 (*)    Platelets 99 (*)    Neutro Abs 1.5 (*)    All other components within normal limits  RETICULOCYTES - Abnormal; Notable for the following:    RBC. 2.81 (*)    All other components within normal limits  LIPASE, BLOOD - Abnormal; Notable for the following:    Lipase 94 (*)    All other components within normal limits  I-STAT CHEM 8, ED - Abnormal; Notable for the following:    Sodium 135 (*)    Potassium 5.8 (*)    BUN 60 (*)    Creatinine, Ser 12.20 (*)    Calcium, Ion 1.01 (*)    Hemoglobin 9.9 (*)    HCT 29.0 (*)    All other  components within normal limits  I-STAT CG4 LACTIC ACID, ED    Imaging Review Dg Chest Portable 1 View  06/03/2014   CLINICAL DATA:  Sickle cell disease with pain  EXAM: PORTABLE CHEST - 1 VIEW  COMPARISON:  None.  FINDINGS: There is focal atelectatic change in the left mid lung. There is cardiomegaly with mild interstitial prominence as well as mild pulmonary venous hypertension. There is no appreciable adenopathy. There are subclavian stents bilaterally; this stent on the left also involves the axillary and brachial vein regions.  IMPRESSION: Suspect mild congestive heart failure. Atelectatic change is noted in the left mid lung. This finding may represent early pneumonia.   Electronically Signed   By: Bretta Bang M.D.   On: 06/03/2014 07:22     EKG Interpretation None      MDM   Final diagnoses:  Nausea & vomiting  Dialysis patient  Hyperkalemia    BP 170/72  Pulse 88  Temp(Src) 98.7 F (37.1 C) (Oral)  Resp 15  SpO2 100%  I have reviewed nursing notes and vital signs. I personally reviewed the imaging tests through PACS system  I reviewed available ER/hospitalization records thought the EMR     Fayrene Helper, [place] 06/05/14 1610

## 2014-06-05 NOTE — ED Notes (Signed)
Pt. reports emesis and generalized body aches/joints pain onset yesterday . Denies fever / no cough or congestion .

## 2014-06-05 NOTE — ED Notes (Signed)
Pt c/o nv back and joint pain. nv onset Monday.pain onset Wednesday. Unable to keep meds down. Vomited last in w/r.  (6-7x in last 24 hrs), describes as black tarry emesis. Rates pain 8/10. (denies : fever, diarrhea, sob, dizziness or urinary sx), last ate Monday. Smoker.denies ETOH or drug use. HD at home on MWF, last attended Wednesday (next due tomorrow). Access site L FA. No meds PTA. Alert,NAD, calm,interactive.

## 2014-06-05 NOTE — ED Notes (Addendum)
Fayrene HelperBowie Tran, Pa-C at bedside explaining risks of phenergan. Prolonged QT syndrome. Also explaining wanting try decreased doses of pain medication.

## 2014-06-05 NOTE — ED Notes (Signed)
Pt continues to refuse to drink ordered kayexelate until pain and nausea are better controlled. Pt continues to exhibit manipulative comments and drug seeking behavior. Now requesting 50mg  benedryl IV. Greta DoomBowie PA-C aware. QT interval 510.

## 2014-06-05 NOTE — Discharge Instructions (Signed)
Please continue with your dialysis to help decrease your high potassium level.  Take your medications as prescribed. Follow up with your doctor closely.  Return if your symptoms worsen or if you have other concerns.    Hyperkalemia Hyperkalemia means you have too much potassium in your blood. Potassium is a type of salt in the blood (electrolyte). Normally, your kidneys remove potassium from the body. Too much potassium can be life-threatening. HOME CARE  Only take medicine as told by your doctor.  Do not take vitamins or natural products unless your doctor says they are okay.  Keep all doctor visits as told.  Follow diet instructions as told by your doctor. GET HELP RIGHT AWAY IF:  Your heartbeat is not regular or very slow.  You feel dizzy (lightheaded).  You feel weak.  You are short of breath.  You have chest pain.  You pass out (faint). MAKE SURE YOU:   Understand these instructions.  Will watch your condition.  Will get help right away if you are not doing well or get worse. Document Released: 12/05/2005 Document Revised: 02/27/2012 Document Reviewed: 05/25/2011 [facility] CenterExitCare Patient Information 2015 [employer], Maryland[employer]. This information is not intended to replace advice given to you by your health care provider. Make sure you discuss any questions you have with your health care provider.

## 2014-06-05 NOTE — ED Notes (Addendum)
Pt with decreased O2 saturations on RA. Placed on 2L [place] for comfort while receiving pain medication. Greta DoomBowie PA-C aware.

## 2014-06-05 NOTE — ED Notes (Signed)
Pt made aware of increased risk of prolonging QT interval with medications. Pt aware of risks and benefits and is conciously assuming those risks despite frequent patient education.

## 2014-06-05 NOTE — ED Notes (Signed)
Pt stating pain is still a 7. Nodding off while receiving pain medication. States "tell me what time it is so I can set my alarm to wake up when its time for my pain medication. "

## 2014-06-05 NOTE — ED Notes (Signed)
Pt returned from xray demanding pain medication for joint pain. States he has chronic pain due to sickle cell and normally recieves 2mg  dilaudid and 25mg  phenergan IV. Fayrene HelperBowie Tran Made aware.

## 2014-06-05 NOTE — ED Notes (Signed)
PT ambulated with baseline gait; VSS; A&Ox3; no signs of distress; respirations even and unlabored; skin warm and dry; no questions upon discharge.  

## 2014-06-05 NOTE — ED Notes (Signed)
Pt being demanding about pain medication. States he's irritated and annoyed that we arent giving higher doses of pain medication such as 2mg  of dilauid which he normally recieves.

## 2014-06-07 ENCOUNTER — Encounter (HOSPITAL_COMMUNITY): Payer: Self-pay | Admitting: Emergency Medicine

## 2014-06-07 ENCOUNTER — Emergency Department (HOSPITAL_COMMUNITY)
Admission: EM | Admit: 2014-06-07 | Discharge: 2014-06-07 | Discharge status: Against Medical Advice | Payer: Medicare Other | Attending: Emergency Medicine | Admitting: Emergency Medicine

## 2014-06-07 DIAGNOSIS — D5700 Hb-SS disease with crisis, unspecified: Secondary | ICD-10-CM | POA: Diagnosis present

## 2014-06-07 DIAGNOSIS — F17200 Nicotine dependence, unspecified, uncomplicated: Secondary | ICD-10-CM | POA: Insufficient documentation

## 2014-06-07 DIAGNOSIS — I120 Hypertensive chronic kidney disease with stage 5 chronic kidney disease or end stage renal disease: Secondary | ICD-10-CM | POA: Diagnosis not present

## 2014-06-07 DIAGNOSIS — N186 End stage renal disease: Secondary | ICD-10-CM | POA: Insufficient documentation

## 2014-06-07 DIAGNOSIS — Z992 Dependence on renal dialysis: Secondary | ICD-10-CM | POA: Diagnosis not present

## 2014-06-07 NOTE — ED Notes (Signed)
Per Pts care everywhere he was just d/c from [facility], pt given IM Dilaudid and Phenergan. Pt confirms this and states he is feeling not better.

## 2014-06-07 NOTE — ED Notes (Signed)
Pt c/o SS pain to joints and back onset yesterday. Pt also c/o n/v this morning, unable to keep down PO Dilaudid or Zofran

## 2014-06-07 NOTE — ED Notes (Signed)
Pt called RN to room, pt states he is unwilling to wait for evaluation, states he will call his physician. Pt requested ice chips, provided this to patient, pt ambulatory from department, NAD. AMA signed

## 2014-06-11 ENCOUNTER — Encounter (HOSPITAL_BASED_OUTPATIENT_CLINIC_OR_DEPARTMENT_OTHER): Payer: Self-pay | Admitting: Emergency Medicine

## 2014-06-11 ENCOUNTER — Emergency Department (HOSPITAL_BASED_OUTPATIENT_CLINIC_OR_DEPARTMENT_OTHER)
Admission: EM | Admit: 2014-06-11 | Discharge: 2014-06-11 | Disposition: A | Discharge status: Home / Self Care | Payer: Medicare Other | Attending: Emergency Medicine | Admitting: Emergency Medicine

## 2014-06-11 DIAGNOSIS — R011 Cardiac murmur, unspecified: Secondary | ICD-10-CM | POA: Insufficient documentation

## 2014-06-11 DIAGNOSIS — D5700 Hb-SS disease with crisis, unspecified: Secondary | ICD-10-CM | POA: Insufficient documentation

## 2014-06-11 DIAGNOSIS — N186 End stage renal disease: Secondary | ICD-10-CM | POA: Diagnosis not present

## 2014-06-11 DIAGNOSIS — Z79899 Other long term (current) drug therapy: Secondary | ICD-10-CM | POA: Insufficient documentation

## 2014-06-11 DIAGNOSIS — I120 Hypertensive chronic kidney disease with stage 5 chronic kidney disease or end stage renal disease: Secondary | ICD-10-CM | POA: Insufficient documentation

## 2014-06-11 DIAGNOSIS — Z992 Dependence on renal dialysis: Secondary | ICD-10-CM | POA: Insufficient documentation

## 2014-06-11 DIAGNOSIS — F17200 Nicotine dependence, unspecified, uncomplicated: Secondary | ICD-10-CM | POA: Insufficient documentation

## 2014-06-11 LAB — CBC WITH DIFFERENTIAL/PLATELET
Basophils Absolute: 0 10*3/uL (ref 0.0–0.1)
Basophils Relative: 0 % (ref 0–1)
Eosinophils Absolute: 0.1 10*3/uL (ref 0.0–0.7)
Eosinophils Relative: 3 % (ref 0–5)
HCT: 28.6 % — ABNORMAL LOW (ref 39.0–52.0)
Hemoglobin: 8.7 g/dL — ABNORMAL LOW (ref 13.0–17.0)
Lymphocytes Relative: 21 % (ref 12–46)
Lymphs Abs: 0.6 10*3/uL — ABNORMAL LOW (ref 0.7–4.0)
MCH: 29.8 pg (ref 26.0–34.0)
MCHC: 30.4 g/dL (ref 30.0–36.0)
MCV: 97.9 fL (ref 78.0–100.0)
Monocytes Absolute: 0.5 10*3/uL (ref 0.1–1.0)
Monocytes Relative: 18 % — ABNORMAL HIGH (ref 3–12)
Neutro Abs: 1.6 10*3/uL — ABNORMAL LOW (ref 1.7–7.7)
Neutrophils Relative %: 57 % (ref 43–77)
Platelets: 139 10*3/uL — ABNORMAL LOW (ref 150–400)
RBC: 2.92 MIL/uL — ABNORMAL LOW (ref 4.22–5.81)
RDW: 16.2 % — ABNORMAL HIGH (ref 11.5–15.5)
WBC: 2.7 10*3/uL — ABNORMAL LOW (ref 4.0–10.5)

## 2014-06-11 LAB — COMPREHENSIVE METABOLIC PANEL
ALT: 9 U/L (ref 0–53)
AST: 20 U/L (ref 0–37)
Albumin: 3.1 g/dL — ABNORMAL LOW (ref 3.5–5.2)
Alkaline Phosphatase: 178 U/L — ABNORMAL HIGH (ref 39–117)
BUN: 31 mg/dL — ABNORMAL HIGH (ref 6–23)
CO2: 28 mEq/L (ref 19–32)
Calcium: 9.2 mg/dL (ref 8.4–10.5)
Chloride: 94 mEq/L — ABNORMAL LOW (ref 96–112)
Creatinine, Ser: 7.4 mg/dL — ABNORMAL HIGH (ref 0.50–1.35)
GFR calc Af Amer: 10 mL/min — ABNORMAL LOW (ref >90)
GFR calc non Af Amer: 9 mL/min — ABNORMAL LOW (ref >90)
Glucose, Bld: 83 mg/dL (ref 70–99)
Potassium: 4.5 mEq/L (ref 3.7–5.3)
Sodium: 136 mEq/L — ABNORMAL LOW (ref 137–147)
Total Bilirubin: 0.4 mg/dL (ref 0.3–1.2)
Total Protein: 9.7 g/dL — ABNORMAL HIGH (ref 6.0–8.3)

## 2014-06-11 LAB — RETICULOCYTES
RBC.: 2.92 MIL/uL — ABNORMAL LOW (ref 4.22–5.81)
Retic Count, Absolute: 46.7 10*3/uL (ref 19.0–186.0)
Retic Ct Pct: 1.6 % (ref 0.4–3.1)

## 2014-06-11 MED ORDER — SODIUM CHLORIDE 0.9 % IV SOLN
INTRAVENOUS | Status: DC
  Administered 2014-06-11: 20:00:00 via INTRAVENOUS

## 2014-06-11 MED ORDER — SODIUM CHLORIDE 0.9 % IV SOLN: 1000.0000 mL | INTRAVENOUS | Status: DC

## 2014-06-11 MED ORDER — ONDANSETRON HCL 4 MG/2ML IJ SOLN
4.0000 mg | INTRAMUSCULAR | Status: DC | PRN
  Administered 2014-06-11: 4 mg via INTRAVENOUS
  Filled 2014-06-11: qty 2

## 2014-06-11 MED ORDER — DIPHENHYDRAMINE HCL 50 MG/ML IJ SOLN
12.5000 mg | Freq: Once | INTRAMUSCULAR | Status: AC
  Administered 2014-06-11: 12.5 mg via INTRAVENOUS

## 2014-06-11 MED ORDER — ONDANSETRON 8 MG PO TBDP: 8.0000 mg | ORAL_TABLET | Freq: Three times a day (TID) | ORAL | Status: DC | PRN

## 2014-06-11 MED ORDER — HYDROMORPHONE HCL PF 2 MG/ML IJ SOLN
2.0000 mg | INTRAMUSCULAR | Status: AC | PRN
  Administered 2014-06-11 (×3): 2 mg via INTRAVENOUS
  Filled 2014-06-11 (×3): qty 1

## 2014-06-11 MED ORDER — DIPHENHYDRAMINE HCL 50 MG/ML IJ SOLN
12.5000 mg | INTRAMUSCULAR | Status: DC | PRN
  Administered 2014-06-11 (×2): 12.5 mg via INTRAVENOUS
  Filled 2014-06-11 (×3): qty 1

## 2014-06-11 MED ORDER — SODIUM CHLORIDE 0.9 % IV SOLN: 1000.0000 mL | Freq: Once | INTRAVENOUS | Status: DC

## 2014-06-11 MED ORDER — HYDROMORPHONE HCL PF 2 MG/ML IJ SOLN
2.0000 mg | Freq: Once | INTRAMUSCULAR | Status: AC
  Administered 2014-06-11: 2 mg via INTRAVENOUS
  Filled 2014-06-11: qty 1

## 2014-06-11 MED ORDER — PROMETHAZINE HCL 25 MG/ML IJ SOLN
25.0000 mg | INTRAMUSCULAR | Status: DC | PRN
  Administered 2014-06-11: 25 mg via INTRAVENOUS
  Filled 2014-06-11: qty 1

## 2014-06-11 NOTE — ED Notes (Addendum)
Pt c/o "sickle cell crisis" x 1 day pt requesting to be admitted

## 2014-06-11 NOTE — ED Notes (Signed)
Pt c/o sickle cell pain onset last pm  Pain to all joints and back  Also n/v,  Denies cough or fever

## 2014-06-11 NOTE — Discharge Instructions (Signed)
Sickle Cell Anemia, Adult °Sickle cell anemia is a condition in which red blood cells have an abnormal "sickle" shape. This abnormal shape shortens the cells' life span, which results in a lower than normal concentration of red blood cells in the blood. The sickle shape also causes the cells to clump together and block free blood flow through the blood vessels. As a result, the tissues and organs of the body do not receive enough oxygen. Sickle cell anemia causes organ damage and pain and increases the risk of infection. °CAUSES  °Sickle cell anemia is a genetic disorder. Those who receive two copies of the gene have the condition, and those who receive one copy have the trait. °RISK FACTORS °The sickle cell gene is most common in people whose families originated in [place]. Other areas of the globe where sickle cell trait occurs include the Mediterranean, [place] and [place], the [place], and the [place].  °SIGNS AND SYMPTOMS °· Pain, especially in the extremities, back, chest, or abdomen (common). The pain may start suddenly or may develop following an illness, especially if there is dehydration. Pain can also occur due to overexertion or exposure to extreme temperature changes. °· Frequent severe bacterial infections, especially certain types of pneumonia and meningitis. °· Pain and swelling in the hands and feet. °· Decreased activity.   °· Loss of appetite.   °· Change in behavior. °· Headaches. °· Seizures. °· Shortness of breath or difficulty breathing. °· Vision changes. °· Skin ulcers. °Those with the trait may not have symptoms or they may have mild symptoms.  °DIAGNOSIS  °Sickle cell anemia is diagnosed with blood tests that demonstrate the genetic trait. It is often diagnosed during the newborn period, due to mandatory testing nationwide. A variety of blood tests, X-rays, CT scans, MRI scans, ultrasounds, and lung function tests may also be done to monitor the condition. °TREATMENT  °Sickle  cell anemia may be treated with: °· Medicines. You may be given pain medicines, antibiotic medicines (to treat and prevent infections) or medicines to increase the production of certain types of hemoglobin. °· Fluids. °· Oxygen. °· Blood transfusions. °HOME CARE INSTRUCTIONS  °· Drink enough fluid to keep your urine clear or pale yellow. Increase your fluid intake in hot weather and during exercise. °· Do not smoke. Smoking lowers oxygen levels in the blood.   °· Only take over-the-counter or prescription medicines for pain, fever, or discomfort as directed by your health care provider. °· Take antibiotics as directed by your health care provider. Make sure you finish them it even if you start to feel better.   °· Take supplements as directed by your health care provider.   °· Consider wearing a medical alert bracelet. This tells anyone caring for you in an emergency of your condition.   °· When traveling, keep your medical information, health care provider's names, and the medicines you take with you at all times.   °· If you develop a fever, do not take medicines to reduce the fever right away. This could cover up a problem that is developing. Notify your health care provider. °· Keep all follow-up appointments with your health care provider. Sickle cell anemia requires regular medical care. °SEEK MEDICAL CARE IF: ° You have a fever. °SEEK IMMEDIATE MEDICAL CARE IF:  °· You feel dizzy or faint.   °· You have new abdominal pain, especially on the left side near the stomach area.   °· You develop a persistent, often uncomfortable and painful penile erection (priapism). If this is not treated immediately it   will lead to impotence.   °· You have numbness your arms or legs or you have a hard time moving them.   °· You have a hard time with speech.   °· You have a fever or persistent symptoms for more than 2-3 days.   °· You have a fever and your symptoms suddenly get worse.   °· You have signs or symptoms of infection.  These include:   °¨ Chills.   °¨ Abnormal tiredness (lethargy).   °¨ Irritability.   °¨ Poor eating.   °¨ Vomiting.   °· You develop pain that is not helped with medicine.   °· You develop shortness of breath. °· You have pain in your chest.   °· You are coughing up pus-like or bloody sputum.   °· You develop a stiff neck. °· Your feet or hands swell or have pain. °· Your abdomen appears bloated. °· You develop joint pain. °MAKE SURE YOU: °· Understand these instructions. °· Will watch your child's condition. °· Will get help right away if your child is not doing well or gets worse. °Document Released: 03/15/2006 Document Revised: 09/25/2013 Document Reviewed: 07/17/2013 °ExitCare® Patient Information ©2015 [employer]. This information is not intended to replace advice given to you by your health care provider. Make sure you discuss any questions you have with your health care provider. ° °

## 2014-06-11 NOTE — ED Provider Notes (Addendum)
CSN: [id number]     Arrival date & time 06/11/14  1900 History   None    This chart was scribed for Linwood Dibbles, MD by Arlan Organ, ED Scribe. This patient was seen in room MH03/MH03 and the patient's care was started 7:26 PM.   Chief Complaint  Patient presents with  . Sickle Cell Pain Crisis   HPI  HPI Comments: [name] is a 33 y.o. male with a PMHx of HTN and sickle cell anemia who presents to the Emergency Department complaining of a sickle cell crisis x 1 day. He reports constant, moderate generalized joint pain and back pain which he states is conistant with previous sickle cell crisis'. Pt also reports nausea and vomiting resulting in pt not being able to tolerate his medications at this time. He denies any cough, CP, and SOB. He is currently a dialysis pt;  Last had dialysis treatment yesterday. He was seen 6/18 for same and was treated with benadryl/reglan/dilaudid for pain management. Pt is followed by Dr. Henrene Dodge in [place] and is currently here from out of town for a funeral. No known allergies to medications.  Past Medical History  Diagnosis Date  . Hypertension   . Sickle cell anemia   . ESRD on hemodialysis    Past Surgical History  Procedure Laterality Date  . Hip surgery     History reviewed. No pertinent family history. History  Substance Use Topics  . Smoking status: Current Every Day Smoker  . Smokeless tobacco: Not on file  . Alcohol Use: No    Review of Systems  Musculoskeletal: Positive for back pain.       Generalized joint pain  All other systems reviewed and are negative.     Allergies  Review of patient's allergies indicates no known allergies.  Home Medications   Prior to Admission medications   Medication Sig Start Date End Date Taking? Authorizing Provider  amLODipine (NORVASC) 5 MG tablet Take 5 mg by mouth daily.    Historical Provider, MD  b complex-vitamin c-folic acid (NEPHRO-VITE) 0.8 MG TABS tablet Take 1 tablet by mouth  at bedtime.    Historical Provider, MD  diphenhydrAMINE (BENADRYL) 25 mg capsule Take 25 mg by mouth every 6 (six) hours as needed for allergies.    Historical Provider, MD  folic acid (FOLVITE) 1 MG tablet Take 1 mg by mouth daily.    Historical Provider, MD  HYDROmorphone (DILAUDID) 8 MG tablet Take 16 mg by mouth every 4 (four) hours as needed for severe pain.    Historical Provider, MD  hydroxyurea (HYDREA) 500 MG capsule Take 500 mg by mouth 2 (two) times daily. May take with food to minimize GI side effects.    Historical Provider, MD  lisinopril (PRINIVIL,ZESTRIL) 10 MG tablet Take 10 mg by mouth 2 (two) times daily.    Historical Provider, MD  ondansetron (ZOFRAN ODT) 8 MG disintegrating tablet Take 1 tablet (8 mg total) by mouth every 8 (eight) hours as needed for nausea or vomiting. 06/11/14   Linwood Dibbles, MD   Triage Vitals: BP 175/91  Pulse 88  Temp(Src) 98.4 F (36.9 C)  Resp 16  Ht 5\' 7"  (1.702 m)  Wt 190 lb (86.183 kg)  BMI 29.75 kg/m2  SpO2 98%   Physical Exam  Nursing note and vitals reviewed. Constitutional: He appears well-developed and well-nourished. No distress.  HENT:  Head: Normocephalic and atraumatic.  Right Ear: External ear normal.  Left Ear: External ear normal.  Eyes: Conjunctivae are normal. Right eye exhibits no discharge. Left eye exhibits no discharge. No scleral icterus.  Neck: Neck supple. No tracheal deviation present.  Cardiovascular: Normal rate, regular rhythm and intact distal pulses.   Murmur heard.  Systolic murmur is present   Diastolic murmur is present  Pulmonary/Chest: Effort normal and breath sounds normal. No stridor. No respiratory distress. He has no wheezes. He has no rales.  Abdominal: Soft. Bowel sounds are normal. He exhibits no distension. There is no tenderness. There is no rebound and no guarding.  Musculoskeletal: He exhibits no edema and no tenderness.  AVf fistula left upper extremity, no erythema or effusion of joints    Neurological: He is alert. He has normal strength. No cranial nerve deficit (no facial droop, extraocular movements intact, no slurred speech) or sensory deficit. He exhibits normal muscle tone. He displays no seizure activity. Coordination normal.  Skin: Skin is warm and dry. No rash noted.  Psychiatric: He has a normal mood and affect.    ED Course  Procedures (including critical care time)  DIAGNOSTIC STUDIES: Oxygen Saturation is 98% on RA, Normal by my interpretation.    COORDINATION OF CARE: 8:01 PM- Will order CBC, CMP, reticulocytes. Will give Benadryl, Zofran, and Dilaudid. Discussed treatment plan with pt at bedside and pt agreed to plan.     Labs Review Labs Reviewed  CBC WITH DIFFERENTIAL - Abnormal; Notable for the following:    WBC 2.7 (*)    RBC 2.92 (*)    Hemoglobin 8.7 (*)    HCT 28.6 (*)    RDW 16.2 (*)    Platelets 139 (*)    Neutro Abs 1.6 (*)    Lymphs Abs 0.6 (*)    Monocytes Relative 18 (*)    All other components within normal limits  COMPREHENSIVE METABOLIC PANEL - Abnormal; Notable for the following:    Sodium 136 (*)    Chloride 94 (*)    BUN 31 (*)    Creatinine, Ser 7.40 (*)    Total Protein 9.7 (*)    Albumin 3.1 (*)    Alkaline Phosphatase 178 (*)    GFR calc non Af Amer 9 (*)    GFR calc Af Amer 10 (*)    All other components within normal limits  RETICULOCYTES - Abnormal; Notable for the following:    RBC. 2.92 (*)    All other components within normal limits   Medications  ondansetron (ZOFRAN) injection 4 mg (4 mg Intravenous Given 06/11/14 1954)  diphenhydrAMINE (BENADRYL) injection 12.5 mg (12.5 mg Intravenous Given 06/11/14 2155)  0.9 %  sodium chloride infusion ( Intravenous New Bag/Given 06/11/14 2002)  promethazine (PHENERGAN) injection 25 mg (25 mg Intravenous Given 06/11/14 2033)  HYDROmorphone (DILAUDID) injection 2 mg (2 mg Intravenous Given 06/11/14 2122)  diphenhydrAMINE (BENADRYL) injection 12.5 mg (12.5 mg Intravenous Given  06/11/14 2122)  HYDROmorphone (DILAUDID) injection 2 mg (2 mg Intravenous Given 06/11/14 2157)     MDM   Final diagnoses:  Sickle cell crisis    Patient improved with treatment in the emergency department. Patient states he felt like his pain had resolved and he was comfortable going home. This attack was similar to his previous sickle cell crisis attacks. Patient has medications for pain at home. He requested medications for nausea. Patient is visiting in the area and will follow up with his doctor when he returns home  I personally performed the services described in this documentation, which was scribed in my  presence. The recorded information has been reviewed and is accurate.    Linwood DibblesJon Knapp, MD 06/11/14 [id number]  Linwood DibblesJon Knapp, MD 06/11/14 2216

## 2014-06-16 ENCOUNTER — Emergency Department (HOSPITAL_BASED_OUTPATIENT_CLINIC_OR_DEPARTMENT_OTHER)
Admission: EM | Admit: 2014-06-16 | Discharge: 2014-06-17 | Disposition: A | Discharge status: Home / Self Care | Payer: Medicare Other | Attending: Emergency Medicine | Admitting: Emergency Medicine

## 2014-06-16 ENCOUNTER — Encounter (HOSPITAL_BASED_OUTPATIENT_CLINIC_OR_DEPARTMENT_OTHER): Payer: Self-pay | Admitting: Emergency Medicine

## 2014-06-16 DIAGNOSIS — R112 Nausea with vomiting, unspecified: Secondary | ICD-10-CM | POA: Diagnosis not present

## 2014-06-16 DIAGNOSIS — Z79899 Other long term (current) drug therapy: Secondary | ICD-10-CM | POA: Diagnosis not present

## 2014-06-16 DIAGNOSIS — I120 Hypertensive chronic kidney disease with stage 5 chronic kidney disease or end stage renal disease: Secondary | ICD-10-CM | POA: Diagnosis not present

## 2014-06-16 DIAGNOSIS — D571 Sickle-cell disease without crisis: Secondary | ICD-10-CM | POA: Diagnosis present

## 2014-06-16 DIAGNOSIS — D5700 Hb-SS disease with crisis, unspecified: Secondary | ICD-10-CM

## 2014-06-16 DIAGNOSIS — Z992 Dependence on renal dialysis: Secondary | ICD-10-CM | POA: Diagnosis not present

## 2014-06-16 DIAGNOSIS — F17200 Nicotine dependence, unspecified, uncomplicated: Secondary | ICD-10-CM | POA: Diagnosis not present

## 2014-06-16 DIAGNOSIS — N186 End stage renal disease: Secondary | ICD-10-CM | POA: Insufficient documentation

## 2014-06-16 HISTORY — DX: Human immunodeficiency virus (HIV) disease: B20

## 2014-06-16 HISTORY — DX: Malingerer (conscious simulation): Z76.5

## 2014-06-16 MED ORDER — PROMETHAZINE HCL 25 MG/ML IJ SOLN
25.0000 mg | Freq: Once | INTRAMUSCULAR | Status: AC
  Administered 2014-06-17: 25 mg via INTRAVENOUS
  Filled 2014-06-16: qty 1

## 2014-06-16 MED ORDER — HYDROMORPHONE HCL PF 2 MG/ML IJ SOLN
2.0000 mg | Freq: Once | INTRAMUSCULAR | Status: AC
  Administered 2014-06-17: 2 mg via INTRAVENOUS
  Filled 2014-06-16: qty 1

## 2014-06-16 NOTE — ED Provider Notes (Signed)
CSN: [id number]     Arrival date & time 06/16/14  2323 History  This chart was scribed for April Smitty CordsK Palumbo-Rasch, MD by Shari HeritageAisha Amuda, ED Scribe. The patient was seen in room MH05/MH05. Patient's care was started at 11:48 PM.   Chief Complaint  Patient presents with  . Sickle Cell Pain Crisis    Patient is a 33 y.o. male presenting with sickle cell pain. The history is provided by the patient. No language interpreter was used.  Sickle Cell Pain Crisis Location:  Diffuse Severity:  Moderate Duration:  1 day Timing:  Constant Progression:  Worsening History of pulmonary emboli: no   Context: not low humidity   Relieved by:  Nothing Worsened by:  Nothing tried Ineffective treatments:  None tried Associated symptoms: nausea and vomiting   Associated symptoms: no chest pain, no congestion, no cough, no fever, no headaches, no priapism, no shortness of breath, no sore throat, no swelling of legs, no vision change and no wheezing   Risk factors: smoking     HPI Comments: [name] is a 33 y.o. male with history of sickle cell anemia and ESRD on hemodialysis MWF who presents to the Emergency Department complaining of diffuse, constant body pain for the last 1 day.  He states he is currently visiting [place] from [place] for a funeral with plans to return home Friday.  While the pain is diffuse he states that pain is worst in his knees. There is associated nausea and vomiting. Patient is not sure what contributed to current pain crisis. There is no visual disturbance, chest pain, SOB, sore throat, difficulty urinating, headache, rash, leg swelling, abdominal pain or other symptoms. Patient also has a history oF HTN.    Past Medical History  Diagnosis Date  . Hypertension   . Sickle cell anemia   . ESRD on hemodialysis    Past Surgical History  Procedure Laterality Date  . Hip surgery     No family history on file. History  Substance Use Topics  . Smoking status: Current Every Day  Smoker  . Smokeless tobacco: Not on file  . Alcohol Use: No    Review of Systems  Constitutional: Negative for fever and chills.  HENT: Negative for congestion and sore throat.   Eyes: Negative for visual disturbance.  Respiratory: Negative for cough, shortness of breath and wheezing.   Cardiovascular: Negative for chest pain and leg swelling.  Gastrointestinal: Positive for nausea and vomiting.  Genitourinary: Negative for difficulty urinating.  Musculoskeletal: Positive for arthralgias and myalgias.  Skin: Negative for rash.  Neurological: Negative for headaches.  All other systems reviewed and are negative.   Allergies  Review of patient's allergies indicates no known allergies.  Home Medications   Prior to Admission medications   Medication Sig Start Date End Date Taking? Authorizing Winnell Bento  amLODipine (NORVASC) 5 MG tablet Take 5 mg by mouth daily.    Historical Alexsus Papadopoulos, MD  b complex-vitamin c-folic acid (NEPHRO-VITE) 0.8 MG TABS tablet Take 1 tablet by mouth at bedtime.    Historical Mina Babula, MD  diphenhydrAMINE (BENADRYL) 25 mg capsule Take 25 mg by mouth every 6 (six) hours as needed for allergies.    Historical Margaree Sandhu, MD  folic acid (FOLVITE) 1 MG tablet Take 1 mg by mouth daily.    Historical Mandalyn Pasqua, MD  HYDROmorphone (DILAUDID) 8 MG tablet Take 16 mg by mouth every 4 (four) hours as needed for severe pain.    Historical Asyah Candler, MD  hydroxyurea (HYDREA)  500 MG capsule Take 500 mg by mouth 2 (two) times daily. May take with food to minimize GI side effects.    Historical Benton Tooker, MD  lisinopril (PRINIVIL,ZESTRIL) 10 MG tablet Take 10 mg by mouth 2 (two) times daily.    Historical Eliza Grissinger, MD  ondansetron (ZOFRAN ODT) 8 MG disintegrating tablet Take 1 tablet (8 mg total) by mouth every 8 (eight) hours as needed for nausea or vomiting. 06/11/14   Linwood Dibbles, MD   Triage Vitals: BP 127/70  Pulse 78  Temp(Src) 98.3 F (36.8 C) (Oral)  Resp 16  Ht 5\' 7"  (1.702  m)  Wt 190 lb (86.183 kg)  BMI 29.75 kg/m2  SpO2 97%  Physical Exam  Constitutional: He is oriented to person, place, and time. He appears well-developed and well-nourished. No distress.  HENT:  Head: Normocephalic and atraumatic.  Right Ear: Tympanic membrane, external ear and ear canal normal.  Left Ear: Tympanic membrane, external ear and ear canal normal.  Mouth/Throat: Oropharynx is clear and moist and mucous membranes are normal. No oropharyngeal exudate.  Moist mucous membranes.  Eyes: Conjunctivae and EOM are normal. Pupils are equal, round, and reactive to light.  Neck: Normal range of motion. Neck supple.  Cardiovascular: Normal rate, regular rhythm, normal heart sounds and intact distal pulses.   No murmur heard. Pulmonary/Chest: Effort normal and breath sounds normal. No respiratory distress. He has no wheezes. He has no rales.  Abdominal: Soft. Bowel sounds are normal. There is no tenderness. There is no rebound and no guarding.  Musculoskeletal: Normal range of motion. He exhibits no edema and no tenderness.  Steady gait to the room  Lymphadenopathy:    He has no cervical adenopathy.  Neurological: He is alert and oriented to person, place, and time. He has normal reflexes.  Skin: Skin is warm and dry. He is not diaphoretic.  Psychiatric: He has a normal mood and affect. His behavior is normal.    ED Course  Procedures (including critical care time) DIAGNOSTIC STUDIES: Oxygen Saturation is 97% on room air, normal by my interpretation.    COORDINATION OF CARE: 11:49 PM- Patient informed of current plan for treatment and evaluation and agrees with plan at this time.    Labs Review Labs Reviewed  CBC WITH DIFFERENTIAL - Abnormal; Notable for the following:    WBC 2.6 (*)    RBC 2.84 (*)    Hemoglobin 8.5 (*)    HCT 27.5 (*)    RDW 16.1 (*)    Neutro Abs 1.6 (*)    Lymphs Abs 0.6 (*)    Monocytes Relative 14 (*)    All other components within normal limits   BASIC METABOLIC PANEL - Abnormal; Notable for the following:    Chloride 94 (*)    Glucose, Bld 132 (*)    BUN 70 (*)    Creatinine, Ser 12.60 (*)    GFR calc non Af Amer 5 (*)    GFR calc Af Amer 5 (*)    All other components within normal limits  RETICULOCYTES   Medications  HYDROmorphone (DILAUDID) injection 2 mg (2 mg Intravenous Given 06/17/14 0013)  promethazine (PHENERGAN) injection 25 mg (25 mg Intravenous Given 06/17/14 0013)  HYDROmorphone (DILAUDID) injection 2 mg (2 mg Intravenous Given 06/17/14 0112)     MDM   Final diagnoses:  None  Upon exam patient is well appearing. Exam and vital signs are benign and reassuring.  Called out within 20 minutes of first dose of  medication requesting additional dose  MDM Reviewed: previous chart, nursing note and vitals (via care everywhere, notes and labs reviewed [facility] and [facility]) Reviewed previous: labs Interpretation: labs  Patient initially reported he was visiting from [place] for a funeral and was scheduled to return on Friday as that is his home but upon review of patient's history in Care Everywhere EDP noted that patient has multiple aliases used at multiple sites including [place].    Stated he was ready for discharge.    I personally performed the services described in this documentation, which was scribed in my presence. The recorded information has been reviewed and is accurate.     Jasmine AweApril K Palumbo-Rasch, MD 06/17/14 [phone]

## 2014-06-16 NOTE — ED Notes (Signed)
Pt c/o " sickle cell pain " seen here multiple times this month for same

## 2014-06-17 ENCOUNTER — Encounter (HOSPITAL_BASED_OUTPATIENT_CLINIC_OR_DEPARTMENT_OTHER): Payer: Self-pay | Admitting: Emergency Medicine

## 2014-06-17 LAB — CBC WITH DIFFERENTIAL/PLATELET
Basophils Absolute: 0 10*3/uL (ref 0.0–0.1)
Basophils Relative: 0 % (ref 0–1)
EOS PCT: 2 % (ref 0–5)
Eosinophils Absolute: 0.1 10*3/uL (ref 0.0–0.7)
HCT: 27.5 % — ABNORMAL LOW (ref 39.0–52.0)
HEMOGLOBIN: 8.5 g/dL — AB (ref 13.0–17.0)
LYMPHS ABS: 0.6 10*3/uL — AB (ref 0.7–4.0)
LYMPHS PCT: 22 % (ref 12–46)
MCH: 29.9 pg (ref 26.0–34.0)
MCHC: 30.9 g/dL (ref 30.0–36.0)
MCV: 96.8 fL (ref 78.0–100.0)
MONOS PCT: 14 % — AB (ref 3–12)
Monocytes Absolute: 0.4 10*3/uL (ref 0.1–1.0)
Neutro Abs: 1.6 10*3/uL — ABNORMAL LOW (ref 1.7–7.7)
Neutrophils Relative %: 62 % (ref 43–77)
PLATELETS: 157 10*3/uL (ref 150–400)
RBC: 2.84 MIL/uL — AB (ref 4.22–5.81)
RDW: 16.1 % — ABNORMAL HIGH (ref 11.5–15.5)
WBC: 2.6 10*3/uL — AB (ref 4.0–10.5)

## 2014-06-17 LAB — BASIC METABOLIC PANEL
BUN: 70 mg/dL — ABNORMAL HIGH (ref 6–23)
CO2: 24 meq/L (ref 19–32)
Calcium: 8.7 mg/dL (ref 8.4–10.5)
Chloride: 94 mEq/L — ABNORMAL LOW (ref 96–112)
Creatinine, Ser: 12.6 mg/dL — ABNORMAL HIGH (ref 0.50–1.35)
GFR calc Af Amer: 5 mL/min — ABNORMAL LOW (ref >90)
GFR calc non Af Amer: 5 mL/min — ABNORMAL LOW (ref >90)
GLUCOSE: 132 mg/dL — AB (ref 70–99)
Potassium: 5 mEq/L (ref 3.7–5.3)
Sodium: 137 mEq/L (ref 137–147)

## 2014-06-17 LAB — RETICULOCYTES
RBC.: 2.83 MIL/uL — AB (ref 4.22–5.81)
RETIC COUNT ABSOLUTE: 70.8 10*3/uL (ref 19.0–186.0)
Retic Ct Pct: 2.5 % (ref 0.4–3.1)

## 2014-06-17 MED ORDER — HYDROMORPHONE HCL PF 2 MG/ML IJ SOLN
2.0000 mg | Freq: Once | INTRAMUSCULAR | Status: AC
  Administered 2014-06-17: 2 mg via INTRAVENOUS
  Filled 2014-06-17: qty 1

## 2014-06-17 NOTE — Discharge Instructions (Signed)
Sickle Cell Anemia, Adult °Sickle cell anemia is a condition in which red blood cells have an abnormal "sickle" shape. This abnormal shape shortens the cells' life span, which results in a lower than normal concentration of red blood cells in the blood. The sickle shape also causes the cells to clump together and block free blood flow through the blood vessels. As a result, the tissues and organs of the body do not receive enough oxygen. Sickle cell anemia causes organ damage and pain and increases the risk of infection. °CAUSES  °Sickle cell anemia is a genetic disorder. Those who receive two copies of the gene have the condition, and those who receive one copy have the trait. °RISK FACTORS °The sickle cell gene is most common in people whose families originated in [place]. Other areas of the globe where sickle cell trait occurs include the Mediterranean, [place] and [place], the [place], and the [place].  °SIGNS AND SYMPTOMS °· Pain, especially in the extremities, back, chest, or abdomen (common). The pain may start suddenly or may develop following an illness, especially if there is dehydration. Pain can also occur due to overexertion or exposure to extreme temperature changes. °· Frequent severe bacterial infections, especially certain types of pneumonia and meningitis. °· Pain and swelling in the hands and feet. °· Decreased activity.   °· Loss of appetite.   °· Change in behavior. °· Headaches. °· Seizures. °· Shortness of breath or difficulty breathing. °· Vision changes. °· Skin ulcers. °Those with the trait may not have symptoms or they may have mild symptoms.  °DIAGNOSIS  °Sickle cell anemia is diagnosed with blood tests that demonstrate the genetic trait. It is often diagnosed during the newborn period, due to mandatory testing nationwide. A variety of blood tests, X-rays, CT scans, MRI scans, ultrasounds, and lung function tests may also be done to monitor the condition. °TREATMENT  °Sickle  cell anemia may be treated with: °· Medicines. You may be given pain medicines, antibiotic medicines (to treat and prevent infections) or medicines to increase the production of certain types of hemoglobin. °· Fluids. °· Oxygen. °· Blood transfusions. °HOME CARE INSTRUCTIONS  °· Drink enough fluid to keep your urine clear or pale yellow. Increase your fluid intake in hot weather and during exercise. °· Do not smoke. Smoking lowers oxygen levels in the blood.   °· Only take over-the-counter or prescription medicines for pain, fever, or discomfort as directed by your health care provider. °· Take antibiotics as directed by your health care provider. Make sure you finish them it even if you start to feel better.   °· Take supplements as directed by your health care provider.   °· Consider wearing a medical alert bracelet. This tells anyone caring for you in an emergency of your condition.   °· When traveling, keep your medical information, health care provider's names, and the medicines you take with you at all times.   °· If you develop a fever, do not take medicines to reduce the fever right away. This could cover up a problem that is developing. Notify your health care provider. °· Keep all follow-up appointments with your health care provider. Sickle cell anemia requires regular medical care. °SEEK MEDICAL CARE IF: ° You have a fever. °SEEK IMMEDIATE MEDICAL CARE IF:  °· You feel dizzy or faint.   °· You have new abdominal pain, especially on the left side near the stomach area.   °· You develop a persistent, often uncomfortable and painful penile erection (priapism). If this is not treated immediately it   will lead to impotence.   °· You have numbness your arms or legs or you have a hard time moving them.   °· You have a hard time with speech.   °· You have a fever or persistent symptoms for more than 2-3 days.   °· You have a fever and your symptoms suddenly get worse.   °· You have signs or symptoms of infection.  These include:   °¨ Chills.   °¨ Abnormal tiredness (lethargy).   °¨ Irritability.   °¨ Poor eating.   °¨ Vomiting.   °· You develop pain that is not helped with medicine.   °· You develop shortness of breath. °· You have pain in your chest.   °· You are coughing up pus-like or bloody sputum.   °· You develop a stiff neck. °· Your feet or hands swell or have pain. °· Your abdomen appears bloated. °· You develop joint pain. °MAKE SURE YOU: °· Understand these instructions. °· Will watch your child's condition. °· Will get help right away if your child is not doing well or gets worse. °Document Released: 03/15/2006 Document Revised: 09/25/2013 Document Reviewed: 07/17/2013 °ExitCare® Patient Information ©2015 [employer]. This information is not intended to replace advice given to you by your health care provider. Make sure you discuss any questions you have with your health care provider. ° °

## 2014-06-17 NOTE — ED Notes (Signed)
Pt reporting that he is ready for discharge and driver will be here in 10 min.

## 2014-06-17 NOTE — ED Notes (Signed)
Pt informed that per md cannot have pain meds until an hour post last dose, appears in nad, lying in bed resting with eyes closed.

## 2014-06-17 NOTE — ED Notes (Signed)
Pt ambulatory to restroom, steady gait.

## 2014-06-17 NOTE — ED Notes (Signed)
Pt calling out asking for more pain meds, informed that per md cannot have more meds at this time.  Requesting to have timeframe for when he can next have meds.

## 2014-06-20 ENCOUNTER — Emergency Department: Payer: Self-pay | Admitting: Emergency Medicine

## 2014-06-20 LAB — COMPREHENSIVE METABOLIC PANEL
ALT: 16 U/L (ref 12–78)
AST: 33 U/L (ref 15–37)
Albumin: 2.9 g/dL — ABNORMAL LOW (ref 3.4–5.0)
Alkaline Phosphatase: 214 U/L — ABNORMAL HIGH
Anion Gap: 10 (ref 7–16)
BILIRUBIN TOTAL: 0.4 mg/dL (ref 0.2–1.0)
BUN: 29 mg/dL — ABNORMAL HIGH (ref 7–18)
CHLORIDE: 98 mmol/L (ref 98–107)
CREATININE: 7.49 mg/dL — AB (ref 0.60–1.30)
Calcium, Total: 8.8 mg/dL (ref 8.5–10.1)
Co2: 26 mmol/L (ref 21–32)
EGFR (Non-African Amer.): 9 — ABNORMAL LOW
GFR CALC AF AMER: 10 — AB
Glucose: 80 mg/dL (ref 65–99)
Osmolality: 273 (ref 275–301)
Potassium: 4.7 mmol/L (ref 3.5–5.1)
SODIUM: 134 mmol/L — AB (ref 136–145)
Total Protein: 10.1 g/dL — ABNORMAL HIGH (ref 6.4–8.2)

## 2014-06-20 LAB — CBC WITH DIFFERENTIAL/PLATELET
Basophil #: 0 10*3/uL (ref 0.0–0.1)
Basophil %: 0.8 %
Eosinophil #: 0.1 10*3/uL (ref 0.0–0.7)
Eosinophil %: 2.2 %
HCT: 29.5 % — AB (ref 40.0–52.0)
HGB: 9.6 g/dL — AB (ref 13.0–18.0)
LYMPHS PCT: 27.6 %
Lymphocyte #: 0.7 10*3/uL — ABNORMAL LOW (ref 1.0–3.6)
MCH: 30.4 pg (ref 26.0–34.0)
MCHC: 32.6 g/dL (ref 32.0–36.0)
MCV: 93 fL (ref 80–100)
MONO ABS: 0.3 x10 3/mm (ref 0.2–1.0)
Monocyte %: 12.8 %
NEUTROS ABS: 1.4 10*3/uL (ref 1.4–6.5)
Neutrophil %: 56.6 %
PLATELETS: 122 10*3/uL — AB (ref 150–440)
RBC: 3.17 10*6/uL — ABNORMAL LOW (ref 4.40–5.90)
RDW: 17.4 % — AB (ref 11.5–14.5)
WBC: 2.5 10*3/uL — ABNORMAL LOW (ref 3.8–10.6)

## 2014-06-20 LAB — RETICULOCYTES
ABSOLUTE RETIC COUNT: 0.0539 10*6/uL (ref 0.019–0.186)
RETICULOCYTE: 1.68 % (ref 0.4–3.1)

## 2014-07-01 ENCOUNTER — Emergency Department: Payer: Self-pay | Admitting: Emergency Medicine

## 2014-07-01 LAB — CBC WITH DIFFERENTIAL/PLATELET
BASOS ABS: 0 10*3/uL (ref 0.0–0.1)
Basophil %: 0.8 %
EOS ABS: 0.1 10*3/uL (ref 0.0–0.7)
Eosinophil %: 3.6 %
HCT: 28.3 % — ABNORMAL LOW (ref 40.0–52.0)
HGB: 8.8 g/dL — ABNORMAL LOW (ref 13.0–18.0)
Lymphocyte #: 0.6 10*3/uL — ABNORMAL LOW (ref 1.0–3.6)
Lymphocyte %: 27.2 %
MCH: 29.5 pg (ref 26.0–34.0)
MCHC: 31 g/dL — ABNORMAL LOW (ref 32.0–36.0)
MCV: 95 fL (ref 80–100)
MONO ABS: 0.3 x10 3/mm (ref 0.2–1.0)
MONOS PCT: 15 %
Neutrophil #: 1.2 10*3/uL — ABNORMAL LOW (ref 1.4–6.5)
Neutrophil %: 53.4 %
Platelet: 115 10*3/uL — ABNORMAL LOW (ref 150–440)
RBC: 2.98 10*6/uL — AB (ref 4.40–5.90)
RDW: 16.2 % — ABNORMAL HIGH (ref 11.5–14.5)
WBC: 2.3 10*3/uL — ABNORMAL LOW (ref 3.8–10.6)

## 2014-07-01 LAB — BASIC METABOLIC PANEL
ANION GAP: 13 (ref 7–16)
BUN: 51 mg/dL — ABNORMAL HIGH (ref 7–18)
Calcium, Total: 8.5 mg/dL (ref 8.5–10.1)
Chloride: 101 mmol/L (ref 98–107)
Co2: 24 mmol/L (ref 21–32)
Creatinine: 11.82 mg/dL — ABNORMAL HIGH (ref 0.60–1.30)
EGFR (African American): 6 — ABNORMAL LOW
EGFR (Non-African Amer.): 5 — ABNORMAL LOW
GLUCOSE: 110 mg/dL — AB (ref 65–99)
OSMOLALITY: 290 (ref 275–301)
Potassium: 4 mmol/L (ref 3.5–5.1)
SODIUM: 138 mmol/L (ref 136–145)

## 2014-07-01 LAB — RETICULOCYTES
Absolute Retic Count: 0.0157 10*6/uL — ABNORMAL LOW (ref 0.019–0.186)
Reticulocyte: 0.53 % (ref 0.4–3.1)

## 2014-07-04 ENCOUNTER — Emergency Department (HOSPITAL_COMMUNITY)
Admission: EM | Admit: 2014-07-04 | Discharge: 2014-07-04 | Disposition: A | Discharge status: Home / Self Care | Payer: Medicare Other | Attending: Emergency Medicine | Admitting: Emergency Medicine

## 2014-07-04 ENCOUNTER — Encounter (HOSPITAL_COMMUNITY): Payer: Self-pay | Admitting: Emergency Medicine

## 2014-07-04 DIAGNOSIS — N186 End stage renal disease: Secondary | ICD-10-CM | POA: Diagnosis not present

## 2014-07-04 DIAGNOSIS — Z992 Dependence on renal dialysis: Secondary | ICD-10-CM | POA: Insufficient documentation

## 2014-07-04 DIAGNOSIS — D57819 Other sickle-cell disorders with crisis, unspecified: Secondary | ICD-10-CM | POA: Insufficient documentation

## 2014-07-04 DIAGNOSIS — Z79899 Other long term (current) drug therapy: Secondary | ICD-10-CM | POA: Insufficient documentation

## 2014-07-04 DIAGNOSIS — D5700 Hb-SS disease with crisis, unspecified: Secondary | ICD-10-CM

## 2014-07-04 DIAGNOSIS — E119 Type 2 diabetes mellitus without complications: Secondary | ICD-10-CM | POA: Diagnosis not present

## 2014-07-04 DIAGNOSIS — Z21 Asymptomatic human immunodeficiency virus [HIV] infection status: Secondary | ICD-10-CM | POA: Insufficient documentation

## 2014-07-04 DIAGNOSIS — F17200 Nicotine dependence, unspecified, uncomplicated: Secondary | ICD-10-CM | POA: Insufficient documentation

## 2014-07-04 DIAGNOSIS — I120 Hypertensive chronic kidney disease with stage 5 chronic kidney disease or end stage renal disease: Secondary | ICD-10-CM | POA: Diagnosis not present

## 2014-07-04 LAB — CBC WITH DIFFERENTIAL/PLATELET
Basophils Absolute: 0 10*3/uL (ref 0.0–0.1)
Basophils Relative: 0 % (ref 0–1)
Eosinophils Absolute: 0.1 10*3/uL (ref 0.0–0.7)
Eosinophils Relative: 3 % (ref 0–5)
HCT: 25.9 % — ABNORMAL LOW (ref 39.0–52.0)
HEMOGLOBIN: 8.2 g/dL — AB (ref 13.0–17.0)
LYMPHS ABS: 0.7 10*3/uL (ref 0.7–4.0)
LYMPHS PCT: 23 % (ref 12–46)
MCH: 29 pg (ref 26.0–34.0)
MCHC: 31.7 g/dL (ref 30.0–36.0)
MCV: 91.5 fL (ref 78.0–100.0)
MONOS PCT: 9 % (ref 3–12)
Monocytes Absolute: 0.3 10*3/uL (ref 0.1–1.0)
NEUTROS ABS: 2 10*3/uL (ref 1.7–7.7)
NEUTROS PCT: 65 % (ref 43–77)
Platelets: 139 10*3/uL — ABNORMAL LOW (ref 150–400)
RBC: 2.83 MIL/uL — AB (ref 4.22–5.81)
RDW: 15.2 % (ref 11.5–15.5)
WBC: 3.1 10*3/uL — ABNORMAL LOW (ref 4.0–10.5)

## 2014-07-04 LAB — COMPREHENSIVE METABOLIC PANEL
ALBUMIN: 2.8 g/dL — AB (ref 3.5–5.2)
ALK PHOS: 170 U/L — AB (ref 39–117)
ALT: 11 U/L (ref 0–53)
ANION GAP: 20 — AB (ref 5–15)
AST: 20 U/L (ref 0–37)
BILIRUBIN TOTAL: 0.3 mg/dL (ref 0.3–1.2)
BUN: 53 mg/dL — AB (ref 6–23)
CHLORIDE: 95 meq/L — AB (ref 96–112)
CO2: 23 mEq/L (ref 19–32)
Calcium: 8.5 mg/dL (ref 8.4–10.5)
Creatinine, Ser: 10.64 mg/dL — ABNORMAL HIGH (ref 0.50–1.35)
GFR calc non Af Amer: 6 mL/min — ABNORMAL LOW (ref >90)
GFR, EST AFRICAN AMERICAN: 6 mL/min — AB (ref >90)
GLUCOSE: 161 mg/dL — AB (ref 70–99)
POTASSIUM: 4 meq/L (ref 3.7–5.3)
Sodium: 138 mEq/L (ref 137–147)
Total Protein: 8.9 g/dL — ABNORMAL HIGH (ref 6.0–8.3)

## 2014-07-04 LAB — RETICULOCYTES
RBC.: 2.83 MIL/uL — ABNORMAL LOW (ref 4.22–5.81)
Retic Count, Absolute: 25.5 10*3/uL (ref 19.0–186.0)
Retic Ct Pct: 0.9 % (ref 0.4–3.1)

## 2014-07-04 MED ORDER — PROMETHAZINE HCL 25 MG/ML IJ SOLN
25.0000 mg | Freq: Once | INTRAMUSCULAR | Status: AC
  Administered 2014-07-04: 25 mg via INTRAVENOUS
  Filled 2014-07-04: qty 1

## 2014-07-04 MED ORDER — PROMETHAZINE HCL 25 MG PO TABS: 25.0000 mg | ORAL_TABLET | Freq: Four times a day (QID) | ORAL | Status: DC | PRN

## 2014-07-04 MED ORDER — SODIUM CHLORIDE 0.9 % IV BOLUS (SEPSIS)
500.0000 mL | Freq: Once | INTRAVENOUS | Status: AC
  Administered 2014-07-04: 500 mL via INTRAVENOUS

## 2014-07-04 MED ORDER — HYDROMORPHONE HCL PF 1 MG/ML IJ SOLN: 2.0000 mg | Freq: Once | INTRAMUSCULAR | Status: DC

## 2014-07-04 MED ORDER — HYDROMORPHONE HCL PF 1 MG/ML IJ SOLN
3.0000 mg | Freq: Once | INTRAMUSCULAR | Status: AC
  Administered 2014-07-04: 3 mg via INTRAVENOUS
  Filled 2014-07-04: qty 3

## 2014-07-04 MED ORDER — HYDROMORPHONE HCL PF 1 MG/ML IJ SOLN: 1.0000 mg | Freq: Once | INTRAMUSCULAR | Status: DC

## 2014-07-04 MED ORDER — PROMETHAZINE HCL 25 MG RE SUPP: 25.0000 mg | Freq: Four times a day (QID) | RECTAL | Status: DC | PRN

## 2014-07-04 MED ORDER — ONDANSETRON HCL 4 MG/2ML IJ SOLN
4.0000 mg | Freq: Once | INTRAMUSCULAR | Status: AC
  Administered 2014-07-04: 4 mg via INTRAVENOUS
  Filled 2014-07-04: qty 2

## 2014-07-04 MED ORDER — DIPHENHYDRAMINE HCL 50 MG/ML IJ SOLN
25.0000 mg | Freq: Once | INTRAMUSCULAR | Status: AC
  Administered 2014-07-04: 25 mg via INTRAVENOUS
  Filled 2014-07-04: qty 1

## 2014-07-04 NOTE — ED Provider Notes (Signed)
CSN: [id number]     Arrival date & time 07/04/14  0156 History   First MD Initiated Contact with Patient 07/04/14 0245     Chief Complaint  Patient presents with  . Sickle Cell Pain Crisis     (Consider location/radiation/quality/duration/timing/severity/associated sxs/prior Treatment) Patient is a 33 y.o. male presenting with sickle cell pain. The history is provided by the patient.  Sickle Cell Pain Crisis He has been having pain in his joints for the last 24 hours. Pain is severe and he rates it at 8/10. Nothing makes pain better and nothing makes it worse. He has not had any fever, chills, sweats. He has had nausea and vomiting and is not remember take any pain medications because of the vomiting. He is a dialysis patient and is scheduled for dialysis later today. Symptoms are typical for his sickle cell disease. He denies cough or chest pain.  Past Medical History  Diagnosis Date  . Hypertension   . Diabetes mellitus   . Sickle cell anemia   . Renal failure   . Renal failure   . HIV (human immunodeficiency virus infection)   . Blood transfusion    Past Surgical History  Procedure Laterality Date  . Hip surgery      "screw placed into joint"  . Av fistula placement     Family History  Problem Relation Age of Onset  . Cervical cancer Mother    History  Substance Use Topics  . Smoking status: Current Every Day Smoker -- 0.25 packs/day for 10 years    Types: Cigarettes  . Smokeless tobacco: Never Used  . Alcohol Use: No    Review of Systems  All other systems reviewed and are negative.     Allergies  Review of patient's allergies indicates no known allergies.  Home Medications   Prior to Admission medications   Medication Sig Start Date End Date Taking? Authorizing Provider  b complex-vitamin c-folic acid (NEPHRO-VITE) 0.8 MG TABS tablet Take 1 tablet by mouth at bedtime.   Yes Historical Provider, MD  calcium acetate (PHOSLO) 667 MG capsule Take 2,001 mg by  mouth 3 (three) times daily with meals.   Yes Historical Provider, MD  diphenhydrAMINE (BENADRYL) 50 MG capsule Take 50 mg by mouth every 6 (six) hours as needed for itching or allergies.   Yes Historical Provider, MD  folic acid (FOLVITE) 1 MG tablet Take 1 mg by mouth daily.   Yes Historical Provider, MD  HYDROmorphone (DILAUDID) 8 MG tablet Take 8 mg by mouth every 4 (four) hours as needed for severe pain.   Yes Historical Provider, MD  hydroxyurea (HYDREA) 500 MG capsule Take 500 mg by mouth 2 (two) times daily. May take with food to minimize GI side effects.   Yes Historical Provider, MD  lisinopril (PRINIVIL,ZESTRIL) 10 MG tablet Take 10 mg by mouth daily.   Yes Historical Provider, MD   BP 151/59  Pulse 100  Temp(Src) 98 F (36.7 C) (Oral)  Resp 20  Wt 198 lb 6 oz (89.982 kg)  SpO2 96% Physical Exam  Nursing note and vitals reviewed.  32ear old male, resting comfortably and in no acute distress. Vital signs are significant for hypertension with blood pressure 131/59. Oxygen saturation is 96 which is normal. Head is normocephalic and atraumatic. PERRLA, EOMI. Oropharynx is clear. Neck is nontender and supple without adenopathy or JVD. Back is nontender and there is no CVA tenderness. Lungs are clear without rales, wheezes, or rhonchi. Chest is nontender.  Heart has regular rate and rhythm without murmur. Abdomen is soft, flat, nontender without masses or hepatosplenomegaly and peristalsis is normoactive. Extremities have no cyanosis or edema, full range of motion is present. AV shunt is present and left arm with bruit heard. Skin is warm and dry without rash. Neurologic: Mental status is normal, cranial nerves are intact, there are no motor or sensory deficits.  ED Course  Procedures (including critical care time) Labs Review Results for orders placed during the hospital encounter of 07/04/14  CBC WITH DIFFERENTIAL      Result Value Ref Range   WBC 3.1 (*) 4.0 - 10.5 K/uL    RBC 2.83 (*) 4.22 - 5.81 MIL/uL   Hemoglobin 8.2 (*) 13.0 - 17.0 g/dL   HCT 45.4 (*) 09.8 - 11.9 %   MCV 91.5  78.0 - 100.0 fL   MCH 29.0  26.0 - 34.0 pg   MCHC 31.7  30.0 - 36.0 g/dL   RDW 14.7  82.9 - 56.2 %   Platelets 139 (*) 150 - 400 K/uL   Neutrophils Relative % 65  43 - 77 %   Neutro Abs 2.0  1.7 - 7.7 K/uL   Lymphocytes Relative 23  12 - 46 %   Lymphs Abs 0.7  0.7 - 4.0 K/uL   Monocytes Relative 9  3 - 12 %   Monocytes Absolute 0.3  0.1 - 1.0 K/uL   Eosinophils Relative 3  0 - 5 %   Eosinophils Absolute 0.1  0.0 - 0.7 K/uL   Basophils Relative 0  0 - 1 %   Basophils Absolute 0.0  0.0 - 0.1 K/uL  COMPREHENSIVE METABOLIC PANEL      Result Value Ref Range   Sodium 138  137 - 147 mEq/L   Potassium 4.0  3.7 - 5.3 mEq/L   Chloride 95 (*) 96 - 112 mEq/L   CO2 23  19 - 32 mEq/L   Glucose, Bld 161 (*) 70 - 99 mg/dL   BUN 53 (*) 6 - 23 mg/dL   Creatinine, Ser 13.08 (*) 0.50 - 1.35 mg/dL   Calcium 8.5  8.4 - 65.7 mg/dL   Total Protein 8.9 (*) 6.0 - 8.3 g/dL   Albumin 2.8 (*) 3.5 - 5.2 g/dL   AST 20  0 - 37 U/L   ALT 11  0 - 53 U/L   Alkaline Phosphatase 170 (*) 39 - 117 U/L   Total Bilirubin 0.3  0.3 - 1.2 mg/dL   GFR calc non Af Amer 6 (*) >90 mL/min   GFR calc Af Amer 6 (*) >90 mL/min   Anion gap 20 (*) 5 - 15  RETICULOCYTES      Result Value Ref Range   Retic Ct Pct 0.9  0.4 - 3.1 %   RBC. 2.83 (*) 4.22 - 5.81 MIL/uL   Retic Count, Manual 25.5  19.0 - 186.0 K/uL    MDM   Final diagnoses:  Vasoocclusive sickle cell crisis  ESRD on hemodialysis    Exacerbation of sickle cell disease. No evidence of acute chest syndrome. No evidence of infection. Although he is a dialysis patient, he has lost significant fluid because of vomiting so you'll be given gentle IV hydration as well as intravenous narcotics for pain control.  Workup is unremarkable. He eventually got good pain relief after 3 injections of hydromorphone and is discharged. He is scheduled for dialysis later  today and is urged to keep that appointment.  Dione Booze, MD  07/04/14 0609 

## 2014-07-04 NOTE — Discharge Instructions (Signed)
Make sure to go for your dialysis today as scheduled.  Sickle Cell Anemia, Adult Sickle cell anemia is a condition in which red blood cells have an abnormal "sickle" shape. This abnormal shape shortens the cells' life span, which results in a lower than normal concentration of red blood cells in the blood. The sickle shape also causes the cells to clump together and block free blood flow through the blood vessels. As a result, the tissues and organs of the body do not receive enough oxygen. Sickle cell anemia causes organ damage and pain and increases the risk of infection. CAUSES  Sickle cell anemia is a genetic disorder. Those who receive two copies of the gene have the condition, and those who receive one copy have the trait. RISK FACTORS The sickle cell gene is most common in people whose families originated in [place]. Other areas of the globe where sickle cell trait occurs include the [place], [place], the [place], and the [place].  SIGNS AND SYMPTOMS  Pain, especially in the extremities, back, chest, or abdomen (common). The pain may start suddenly or may develop following an illness, especially if there is dehydration. Pain can also occur due to overexertion or exposure to extreme temperature changes.  Frequent severe bacterial infections, especially certain types of pneumonia and meningitis.  Pain and swelling in the hands and feet.  Decreased activity.   Loss of appetite.   Change in behavior.  Headaches.  Seizures.  Shortness of breath or difficulty breathing.  Vision changes.  Skin ulcers. Those with the trait may not have symptoms or they may have mild symptoms.  DIAGNOSIS  Sickle cell anemia is diagnosed with blood tests that demonstrate the genetic trait. It is often diagnosed during the newborn period, due to mandatory testing nationwide. A variety of blood tests, X-rays, CT scans, MRI scans, ultrasounds, and lung function tests may also  be done to monitor the condition. TREATMENT  Sickle cell anemia may be treated with:  Medicines. You may be given pain medicines, antibiotic medicines (to treat and prevent infections) or medicines to increase the production of certain types of hemoglobin.  Fluids.  Oxygen.  Blood transfusions. HOME CARE INSTRUCTIONS   Drink enough fluid to keep your urine clear or pale yellow. Increase your fluid intake in hot weather and during exercise.  Do not smoke. Smoking lowers oxygen levels in the blood.   Only take over-the-counter or prescription medicines for pain, fever, or discomfort as directed by your health care provider.  Take antibiotics as directed by your health care provider. Make sure you finish them it even if you start to feel better.   Take supplements as directed by your health care provider.   Consider wearing a medical alert bracelet. This tells anyone caring for you in an emergency of your condition.   When traveling, keep your medical information, health care provider's names, and the medicines you take with you at all times.   If you develop a fever, do not take medicines to reduce the fever right away. This could cover up a problem that is developing. Notify your health care provider.  Keep all follow-up appointments with your health care provider. Sickle cell anemia requires regular medical care. SEEK MEDICAL CARE IF: You have a fever. SEEK IMMEDIATE MEDICAL CARE IF:   You feel dizzy or faint.   You have new abdominal pain, especially on the left side near the stomach area.   You develop a persistent, often uncomfortable and  painful penile erection (priapism). If this is not treated immediately it will lead to impotence.   You have numbness your arms or legs or you have a hard time moving them.   You have a hard time with speech.   You have a fever or persistent symptoms for more than 2-3 days.   You have a fever and your symptoms suddenly  get worse.   You have signs or symptoms of infection. These include:   Chills.   Abnormal tiredness (lethargy).   Irritability.   Poor eating.   Vomiting.   You develop pain that is not helped with medicine.   You develop shortness of breath.  You have pain in your chest.   You are coughing up pus-like or bloody sputum.   You develop a stiff neck.  Your feet or hands swell or have pain.  Your abdomen appears bloated.  You develop joint pain. MAKE SURE YOU:  Understand these instructions.  Will watch your child's condition.  Will get help right away if your child is not doing well or gets worse. Document Released: 03/15/2006 Document Revised: 09/25/2013 Document Reviewed: 07/17/2013 [facility] Patient Information 2015 [place], [place]. This information is not intended to replace advice given to you by your health care provider. Make sure you discuss any questions you have with your health care provider.  Promethazine suppositories What is this medicine? PROMETHAZINE (proe METH a zeen) is an antihistamine. It is used to treat allergic reactions and to treat or prevent nausea and vomiting from illness or motion sickness. It is also used to make you sleep before surgery, and to help treat pain or nausea after surgery. This medicine may be used for other purposes; ask your health care provider or pharmacist if you have questions. COMMON BRAND NAME(S): Phenadoz, Phenergan, Promethegan What should I tell my health care provider before I take this medicine? They need to know if you have any of these conditions: -glaucoma -high blood pressure or heart disease -kidney disease -liver disease -lung or breathing disease, like asthma -prostate trouble -pain or difficulty passing urine -seizures -an unusual or allergic reaction to promethazine or phenothiazines, other medicines, foods, dyes, or preservatives -pregnant or trying to get pregnant -breast-feeding How  should I use this medicine? This medicine is for rectal use only. Do not take by mouth. Wash your hands before and after use. Take off the foil wrapping. Wet the tip of the suppository with cold tap water to make it easier to use. Lie on your side with your lower leg straightened out and your upper leg bent forward toward your stomach. Lift upper buttock to expose the rectal area. Apply gentle pressure to insert the suppository completely into the rectum, pointed end first. Hold buttocks together for a few seconds. Remain lying down for about 15 minutes to avoid having the suppository come out. Do not use more often than directed. Talk to your pediatrician regarding the use of this medicine in children. Special care may be needed. This medicine should not be given to infants and children younger than 76 years old. Overdosage: If you think you have taken too much of this medicine contact a poison control center or emergency room at once. NOTE: This medicine is only for you. Do not share this medicine with others. What if I miss a dose? If you miss a dose, use it as soon as you can. If it is almost time for your next dose, use only that dose. Do not use double  doses. What may interact with this medicine? Do not take this medicine with any of the following medications: -cisapride -dofetilide -dronedarone -MAOIs like Carbex, Eldepryl, Marplan, Nardil, Parnate -pimozide -quinidine, including dextromethorphan; quinidine -thioridazine -ziprasidone This medicine may also interact with the following medications: -certain medicines for depression, anxiety, or psychotic disturbances -certain medicines for anxiety or sleep -certain medicines for seizures like carbamazepine, phenobarbital, phenytoin -certain medicines for movement abnormalities as in Parkinson's disease, or for gastrointestinal problems -epinephrine -medicines for allergies or colds -muscle relaxants -narcotic medicines for pain -other  medicines that prolong the QT interval (cause an abnormal heart rhythm) -tramadol -trimethobenzamide This list may not describe all possible interactions. Give your health care provider a list of all the medicines, herbs, non-prescription drugs, or dietary supplements you use. Also tell them if you smoke, drink alcohol, or use illegal drugs. Some items may interact with your medicine. What should I watch for while using this medicine? Tell your doctor or health care professional if your symptoms do not start to get better in 1 to 2 days. You may get drowsy or dizzy. Do not drive, use machinery, or do anything that needs mental alertness until you know how this medicine affects you. To reduce the risk of dizzy or fainting spells, do not stand or sit up quickly, especially if you are an older patient. Alcohol may increase dizziness and drowsiness. Avoid alcoholic drinks. Your mouth may get dry. Chewing sugarless gum or sucking hard candy, and drinking plenty of water may help. Contact your doctor if the problem does not go away or is severe. This medicine may cause dry eyes and blurred vision. If you wear contact lenses you may feel some discomfort. Lubricating drops may help. See your eye doctor if the problem does not go away or is severe. This medicine can make you more sensitive to the sun. Keep out of the sun. If you cannot avoid being in the sun, wear protective clothing and use sunscreen. Do not use sun lamps or tanning beds/booths. If you are diabetic, check your blood-sugar levels regularly. What side effects may I notice from receiving this medicine? Side effects that you should report to your doctor or health care professional as soon as possible: -blurred vision -irregular heartbeat, palpitations or chest pain -muscle or facial twitches -pain or difficulty passing urine -seizures -skin rash -slowed or shallow breathing -unusual bleeding or bruising -yellowing of the eyes or skin Side  effects that usually do not require medical attention (report to your doctor or health care professional if they continue or are bothersome): -headache -nightmares, agitation, nervousness, excitability, not able to sleep (these are more likely in children) -stuffy nose This list may not describe all possible side effects. Call your doctor for medical advice about side effects. You may report side effects to FDA at 1-800-FDA-1088. Where should I keep my medicine? Keep out of the reach of children. Store in a refrigerator between 2 and 8 degrees C (36 and 46 degrees F). Throw away any unused medicine after the expiration date. NOTE: This sheet is a summary. It may not cover all possible information. If you have questions about this medicine, talk to your doctor, pharmacist, or health care provider.  2015, [employer]. (2013-08-07 15:57:29)  Promethazine tablets What is this medicine? PROMETHAZINE (proe METH a zeen) is an antihistamine. It is used to treat allergic reactions and to treat or prevent nausea and vomiting from illness or motion sickness. It is also used to make you sleep before  surgery, and to help treat pain or nausea after surgery. This medicine may be used for other purposes; ask your health care provider or pharmacist if you have questions. COMMON BRAND NAME(S): Phenergan What should I tell my health care provider before I take this medicine? They need to know if you have any of these conditions: -glaucoma -high blood pressure or heart disease -kidney disease -liver disease -lung or breathing disease, like asthma -prostate trouble -pain or difficulty passing urine -seizures -an unusual or allergic reaction to promethazine or phenothiazines, other medicines, foods, dyes, or preservatives -pregnant or trying to get pregnant -breast-feeding How should I use this medicine? Take this medicine by mouth with a glass of water. Follow the directions on the prescription  label. Take your doses at regular intervals. Do not take your medicine more often than directed. Talk to your pediatrician regarding the use of this medicine in children. Special care may be needed. This medicine should not be given to infants and children younger than 28 years old. Overdosage: If you think you have taken too much of this medicine contact a poison control center or emergency room at once. NOTE: This medicine is only for you. Do not share this medicine with others. What if I miss a dose? If you miss a dose, take it as soon as you can. If it is almost time for your next dose, take only that dose. Do not take double or extra doses. What may interact with this medicine? Do not take this medicine with any of the following medications: -cisapride -dofetilide -dronedarone -MAOIs like Carbex, Eldepryl, Marplan, Nardil, Parnate -pimozide -quinidine, including dextromethorphan; quinidine -thioridazine -ziprasidone This medicine may also interact with the following medications: -certain medicines for depression, anxiety, or psychotic disturbances -certain medicines for anxiety or sleep -certain medicines for seizures like carbamazepine, phenobarbital, phenytoin -certain medicines for movement abnormalities as in Parkinson's disease, or for gastrointestinal problems -epinephrine -medicines for allergies or colds -muscle relaxants -narcotic medicines for pain -other medicines that prolong the QT interval (cause an abnormal heart rhythm) -tramadol -trimethobenzamide This list may not describe all possible interactions. Give your health care provider a list of all the medicines, herbs, non-prescription drugs, or dietary supplements you use. Also tell them if you smoke, drink alcohol, or use illegal drugs. Some items may interact with your medicine. What should I watch for while using this medicine? Tell your doctor or health care professional if your symptoms do not start to get better  in 1 to 2 days. You may get drowsy or dizzy. Do not drive, use machinery, or do anything that needs mental alertness until you know how this medicine affects you. To reduce the risk of dizzy or fainting spells, do not stand or sit up quickly, especially if you are an older patient. Alcohol may increase dizziness and drowsiness. Avoid alcoholic drinks. Your mouth may get dry. Chewing sugarless gum or sucking hard candy, and drinking plenty of water may help. Contact your doctor if the problem does not go away or is severe. This medicine may cause dry eyes and blurred vision. If you wear contact lenses you may feel some discomfort. Lubricating drops may help. See your eye doctor if the problem does not go away or is severe. This medicine can make you more sensitive to the sun. Keep out of the sun. If you cannot avoid being in the sun, wear protective clothing and use sunscreen. Do not use sun lamps or tanning beds/booths. If you are diabetic, check your  blood-sugar levels regularly. What side effects may I notice from receiving this medicine? Side effects that you should report to your doctor or health care professional as soon as possible: -blurred vision -irregular heartbeat, palpitations or chest pain -muscle or facial twitches -pain or difficulty passing urine -seizures -skin rash -slowed or shallow breathing -unusual bleeding or bruising -yellowing of the eyes or skin Side effects that usually do not require medical attention (report to your doctor or health care professional if they continue or are bothersome): -headache -nightmares, agitation, nervousness, excitability, not able to sleep (these are more likely in children) -stuffy nose This list may not describe all possible side effects. Call your doctor for medical advice about side effects. You may report side effects to FDA at 1-800-FDA-1088. Where should I keep my medicine? Keep out of the reach of children. Store at room  temperature, between 20 and 25 degrees C (68 and 77 degrees F). Protect from light. Throw away any unused medicine after the expiration date. NOTE: This sheet is a summary. It may not cover all possible information. If you have questions about this medicine, talk to your doctor, pharmacist, or health care provider.  2015, [employer]. (2013-08-06 15:04:46)

## 2014-07-04 NOTE — ED Notes (Signed)
Pt reports having scc flare up starting last night, has not taking any meds at home; having n/v

## 2014-07-05 ENCOUNTER — Encounter (HOSPITAL_COMMUNITY): Payer: Self-pay | Admitting: Emergency Medicine

## 2014-07-05 ENCOUNTER — Emergency Department (HOSPITAL_COMMUNITY)
Admission: EM | Admit: 2014-07-05 | Discharge: 2014-07-05 | Disposition: A | Discharge status: Home / Self Care | Payer: Medicare Other | Source: Home / Self Care | Attending: Emergency Medicine | Admitting: Emergency Medicine

## 2014-07-05 DIAGNOSIS — M79604 Pain in right leg: Secondary | ICD-10-CM

## 2014-07-05 DIAGNOSIS — N186 End stage renal disease: Secondary | ICD-10-CM | POA: Diagnosis present

## 2014-07-05 DIAGNOSIS — Z992 Dependence on renal dialysis: Secondary | ICD-10-CM | POA: Diagnosis not present

## 2014-07-05 DIAGNOSIS — Z21 Asymptomatic human immunodeficiency virus [HIV] infection status: Secondary | ICD-10-CM | POA: Diagnosis present

## 2014-07-05 DIAGNOSIS — R079 Chest pain, unspecified: Secondary | ICD-10-CM | POA: Diagnosis not present

## 2014-07-05 DIAGNOSIS — Z79899 Other long term (current) drug therapy: Secondary | ICD-10-CM | POA: Diagnosis not present

## 2014-07-05 DIAGNOSIS — D571 Sickle-cell disease without crisis: Secondary | ICD-10-CM | POA: Diagnosis present

## 2014-07-05 DIAGNOSIS — F17200 Nicotine dependence, unspecified, uncomplicated: Secondary | ICD-10-CM | POA: Diagnosis present

## 2014-07-05 DIAGNOSIS — E119 Type 2 diabetes mellitus without complications: Secondary | ICD-10-CM | POA: Diagnosis present

## 2014-07-05 DIAGNOSIS — I120 Hypertensive chronic kidney disease with stage 5 chronic kidney disease or end stage renal disease: Secondary | ICD-10-CM | POA: Diagnosis present

## 2014-07-05 DIAGNOSIS — M79609 Pain in unspecified limb: Secondary | ICD-10-CM | POA: Diagnosis present

## 2014-07-05 DIAGNOSIS — M79605 Pain in left leg: Secondary | ICD-10-CM

## 2014-07-05 DIAGNOSIS — D5700 Hb-SS disease with crisis, unspecified: Secondary | ICD-10-CM

## 2014-07-05 LAB — CBC WITH DIFFERENTIAL/PLATELET
BASOS PCT: 0 % (ref 0–1)
Basophils Absolute: 0 10*3/uL (ref 0.0–0.1)
EOS ABS: 0.1 10*3/uL (ref 0.0–0.7)
Eosinophils Relative: 3 % (ref 0–5)
HCT: 25 % — ABNORMAL LOW (ref 39.0–52.0)
Hemoglobin: 7.8 g/dL — ABNORMAL LOW (ref 13.0–17.0)
Lymphocytes Relative: 23 % (ref 12–46)
Lymphs Abs: 0.6 10*3/uL — ABNORMAL LOW (ref 0.7–4.0)
MCH: 28.7 pg (ref 26.0–34.0)
MCHC: 31.2 g/dL (ref 30.0–36.0)
MCV: 91.9 fL (ref 78.0–100.0)
Monocytes Absolute: 0.2 10*3/uL (ref 0.1–1.0)
Monocytes Relative: 9 % (ref 3–12)
NEUTROS ABS: 1.5 10*3/uL — AB (ref 1.7–7.7)
NEUTROS PCT: 65 % (ref 43–77)
PLATELETS: 130 10*3/uL — AB (ref 150–400)
RBC: 2.72 MIL/uL — AB (ref 4.22–5.81)
RDW: 15.1 % (ref 11.5–15.5)
WBC: 2.4 10*3/uL — ABNORMAL LOW (ref 4.0–10.5)

## 2014-07-05 LAB — BASIC METABOLIC PANEL
ANION GAP: 19 — AB (ref 5–15)
BUN: 72 mg/dL — AB (ref 6–23)
CO2: 21 meq/L (ref 19–32)
CREATININE: 12.47 mg/dL — AB (ref 0.50–1.35)
Calcium: 8.7 mg/dL (ref 8.4–10.5)
Chloride: 97 mEq/L (ref 96–112)
GFR, EST AFRICAN AMERICAN: 5 mL/min — AB (ref >90)
GFR, EST NON AFRICAN AMERICAN: 5 mL/min — AB (ref >90)
Glucose, Bld: 111 mg/dL — ABNORMAL HIGH (ref 70–99)
Potassium: 4.7 mEq/L (ref 3.7–5.3)
SODIUM: 137 meq/L (ref 137–147)

## 2014-07-05 LAB — HEPATIC FUNCTION PANEL
ALK PHOS: 161 U/L — AB (ref 39–117)
ALT: 8 U/L (ref 0–53)
AST: 17 U/L (ref 0–37)
Albumin: 2.7 g/dL — ABNORMAL LOW (ref 3.5–5.2)
BILIRUBIN TOTAL: 0.3 mg/dL (ref 0.3–1.2)
Total Protein: 8.8 g/dL — ABNORMAL HIGH (ref 6.0–8.3)

## 2014-07-05 LAB — RETICULOCYTES
RBC.: 2.72 MIL/uL — AB (ref 4.22–5.81)
RETIC COUNT ABSOLUTE: 21.8 10*3/uL (ref 19.0–186.0)
RETIC CT PCT: 0.8 % (ref 0.4–3.1)

## 2014-07-05 LAB — LIPASE, BLOOD: Lipase: 92 U/L — ABNORMAL HIGH (ref 11–59)

## 2014-07-05 MED ORDER — HYDROMORPHONE HCL PF 1 MG/ML IJ SOLN
1.0000 mg | Freq: Once | INTRAMUSCULAR | Status: AC
  Administered 2014-07-05: 1 mg via INTRAVENOUS
  Filled 2014-07-05: qty 1

## 2014-07-05 MED ORDER — PROMETHAZINE HCL 25 MG/ML IJ SOLN
12.5000 mg | Freq: Once | INTRAMUSCULAR | Status: AC
  Administered 2014-07-05: 12.5 mg via INTRAVENOUS
  Filled 2014-07-05: qty 1

## 2014-07-05 MED ORDER — SODIUM CHLORIDE 0.9 % IV BOLUS (SEPSIS)
250.0000 mL | Freq: Once | INTRAVENOUS | Status: AC
  Administered 2014-07-05: 250 mL via INTRAVENOUS

## 2014-07-05 MED ORDER — ONDANSETRON 4 MG PO TBDP: ORAL_TABLET | ORAL | Status: DC

## 2014-07-05 MED ORDER — ONDANSETRON HCL 4 MG/2ML IJ SOLN
4.0000 mg | Freq: Once | INTRAMUSCULAR | Status: AC
  Administered 2014-07-05: 4 mg via INTRAVENOUS
  Filled 2014-07-05: qty 2

## 2014-07-05 NOTE — ED Notes (Signed)
MD at bedside. 

## 2014-07-05 NOTE — ED Notes (Signed)
I have just notified Dr. Jodi MourningZavitz of pt. Request for additional pain and nausea med.

## 2014-07-05 NOTE — ED Notes (Signed)
Per pt, sickle cell pain since yesterday.  [facility] and [facility].  Pt told to return if pain continues.  Pain has not gotten better.

## 2014-07-05 NOTE — Discharge Instructions (Signed)
If you were given medicines take as directed.  If you are on coumadin or contraceptives realize their levels and effectiveness is altered by many different medicines.  If you have any reaction (rash, tongues swelling, other) to the medicines stop taking and see a physician.   Please follow up as directed and return to the ER or see a physician for new or worsening symptoms.  Thank you. Filed Vitals:   07/05/14 1212 07/05/14 1427  BP: 199/84 136/47  Pulse: 80 82  Temp: 98.2 F (36.8 C) 98.4 F (36.9 C)  TempSrc: Oral Oral  Resp: 20 16  SpO2: 98% 99%

## 2014-07-05 NOTE — ED Provider Notes (Signed)
CSN: [id number]     Arrival date & time 07/05/14  1206 History   First MD Initiated Contact with Patient 07/05/14 1220     Chief Complaint  Patient presents with  . Sickle Cell Pain Crisis     (Consider location/radiation/quality/duration/timing/severity/associated sxs/prior Treatment) HPI Comments: 17 old male with history of pancreatitis, end-stage renal disease was dialyzed yesterday without problems, blood pressure, HIV, noncompliance with dialysis in the past and was medication presents with lower joint pain bilateral lower extremities and vomiting for the past 24 hours. This is similar to previous sickle cell pain crises. Patient denies focal bowel pain or fevers. Nonradiating symptoms worse with movement however constant deep ache. No worsening swelling. Nothing improves his symptoms.  Patient is a 33 y.o. male presenting with sickle cell pain. The history is provided by the patient.  Sickle Cell Pain Crisis Associated symptoms: nausea and vomiting (nonbloody)   Associated symptoms: no chest pain, no congestion, no fever, no headaches and no shortness of breath     Past Medical History  Diagnosis Date  . Hypertension   . Diabetes mellitus   . Sickle cell anemia   . Renal failure   . Renal failure   . HIV (human immunodeficiency virus infection)   . Blood transfusion    Past Surgical History  Procedure Laterality Date  . Hip surgery      "screw placed into joint"  . Av fistula placement     Family History  Problem Relation Age of Onset  . Cervical cancer Mother    History  Substance Use Topics  . Smoking status: Current Every Day Smoker -- 0.25 packs/day for 10 years    Types: Cigarettes  . Smokeless tobacco: Never Used  . Alcohol Use: No    Review of Systems  Constitutional: Negative for fever and chills.  HENT: Negative for congestion.   Eyes: Negative for visual disturbance.  Respiratory: Negative for shortness of breath.   Cardiovascular: Negative for  chest pain.  Gastrointestinal: Positive for nausea and vomiting (nonbloody). Negative for abdominal pain.  Genitourinary: Negative for dysuria and flank pain.  Musculoskeletal: Positive for arthralgias. Negative for back pain, neck pain and neck stiffness.  Skin: Negative for rash.  Neurological: Negative for light-headedness and headaches.      Allergies  Review of patient's allergies indicates no known allergies.  Home Medications   Prior to Admission medications   Medication Sig Start Date End Date Taking? Authorizing Provider  b complex-vitamin c-folic acid (NEPHRO-VITE) 0.8 MG TABS tablet Take 1 tablet by mouth at bedtime.   Yes Historical Provider, MD  calcium acetate (PHOSLO) 667 MG capsule Take 2,001 mg by mouth 3 (three) times daily with meals.   Yes Historical Provider, MD  diphenhydrAMINE (BENADRYL) 50 MG capsule Take 50 mg by mouth every 6 (six) hours as needed for itching or allergies.   Yes Historical Provider, MD  folic acid (FOLVITE) 1 MG tablet Take 1 mg by mouth daily.   Yes Historical Provider, MD  HYDROmorphone (DILAUDID) 8 MG tablet Take 8 mg by mouth every 4 (four) hours as needed for severe pain.   Yes Historical Provider, MD  hydroxyurea (HYDREA) 500 MG capsule Take 500 mg by mouth 2 (two) times daily. May take with food to minimize GI side effects.   Yes Historical Provider, MD  lisinopril (PRINIVIL,ZESTRIL) 10 MG tablet Take 10 mg by mouth daily.   Yes Historical Provider, MD  promethazine (PHENERGAN) 25 MG tablet Take 25 mg by  mouth every 6 (six) hours as needed for nausea or vomiting.   Yes Historical Provider, MD   BP 199/84  Pulse 80  Temp(Src) 98.2 F (36.8 C) (Oral)  Resp 20  SpO2 98% Physical Exam  Nursing note and vitals reviewed. Constitutional: He is oriented to person, place, and time. He appears well-developed and well-nourished.  HENT:  Head: Normocephalic and atraumatic.  Mild dry mucous membranes  Eyes: Conjunctivae are normal. Right eye  exhibits no discharge. Left eye exhibits no discharge.  Neck: Normal range of motion. Neck supple. No tracheal deviation present.  Cardiovascular: Normal rate and regular rhythm.   Pulmonary/Chest: Effort normal and breath sounds normal.  Abdominal: Soft. He exhibits no distension. There is no tenderness. There is no guarding.  Musculoskeletal: He exhibits no edema.  Full range of motion of lower chimney joints without effusions, mild leg swelling bilateral without sign of septic joint or infection.  Neurological: He is alert and oriented to person, place, and time.  Skin: Skin is warm. No rash noted.  Psychiatric: He has a normal mood and affect.    ED Course  Procedures (including critical care time) Labs Review Labs Reviewed  BASIC METABOLIC PANEL - Abnormal; Notable for the following:    Glucose, Bld 111 (*)    BUN 72 (*)    Creatinine, Ser 12.47 (*)    GFR calc non Af Amer 5 (*)    GFR calc Af Amer 5 (*)    Anion gap 19 (*)    All other components within normal limits  CBC WITH DIFFERENTIAL - Abnormal; Notable for the following:    WBC 2.4 (*)    RBC 2.72 (*)    Hemoglobin 7.8 (*)    HCT 25.0 (*)    Platelets 130 (*)    Neutro Abs 1.5 (*)    Lymphs Abs 0.6 (*)    All other components within normal limits  RETICULOCYTES - Abnormal; Notable for the following:    RBC. 2.72 (*)    All other components within normal limits  LIPASE, BLOOD - Abnormal; Notable for the following:    Lipase 92 (*)    All other components within normal limits  HEPATIC FUNCTION PANEL - Abnormal; Notable for the following:    Total Protein 8.8 (*)    Albumin 2.7 (*)    Alkaline Phosphatase 161 (*)    All other components within normal limits    Imaging Review No results found.   EKG Interpretation None      MDM   Final diagnoses:  Sickle cell pain crisis  Leg pain, bilateral   anemia htn  Patient with sickle cell pain crisis similar to previous. Plan for blood work, pain meds,  small IV fluid bolus with vomiting. Multiple pain medicines in ER with minimal improvement. Initial plan for admission for pain control however on reassessment patient decided he would rather go home and enjoy his Saturday as he does not have dialysis today. We discussed reasons to return and patient has outpatient followup. Patient did have mild pain improved in ER.  Results and differential diagnosis were discussed with the patient/parent/guardian. Close follow up outpatient was discussed, comfortable with the plan.   Medications  HYDROmorphone (DILAUDID) injection 1 mg (not administered)  HYDROmorphone (DILAUDID) injection 1 mg (1 mg Intravenous Given 07/05/14 1316)  sodium chloride 0.9 % bolus 250 mL (250 mLs Intravenous New Bag/Given 07/05/14 1314)  HYDROmorphone (DILAUDID) injection 1 mg (1 mg Intravenous Given 07/05/14 1336)  promethazine (PHENERGAN) injection 12.5 mg (12.5 mg Intravenous Given 07/05/14 1335)  ondansetron (ZOFRAN) injection 4 mg (4 mg Intravenous Given 07/05/14 1436)  HYDROmorphone (DILAUDID) injection 1 mg (1 mg Intravenous Given 07/05/14 1436)    Filed Vitals:   07/05/14 1212 07/05/14 1427  BP: 199/84 136/47  Pulse: 80 82  Temp: 98.2 F (36.8 C) 98.4 F (36.9 C)  TempSrc: Oral Oral  Resp: 20 16  SpO2: 98% 99%       Enid Skeens, MD 07/05/14 1503

## 2014-07-06 ENCOUNTER — Emergency Department (HOSPITAL_COMMUNITY): Payer: Medicare Other

## 2014-07-06 ENCOUNTER — Encounter (HOSPITAL_COMMUNITY): Payer: Self-pay | Admitting: Emergency Medicine

## 2014-07-06 ENCOUNTER — Emergency Department (HOSPITAL_COMMUNITY)
Admission: EM | Admit: 2014-07-06 | Discharge: 2014-07-06 | DRG: 313 | Discharge status: Against Medical Advice | Payer: Medicare Other | Attending: Internal Medicine | Admitting: Internal Medicine

## 2014-07-06 DIAGNOSIS — J189 Pneumonia, unspecified organism: Secondary | ICD-10-CM

## 2014-07-06 DIAGNOSIS — D573 Sickle-cell trait: Secondary | ICD-10-CM

## 2014-07-06 DIAGNOSIS — Z992 Dependence on renal dialysis: Secondary | ICD-10-CM

## 2014-07-06 DIAGNOSIS — I10 Essential (primary) hypertension: Secondary | ICD-10-CM

## 2014-07-06 DIAGNOSIS — E119 Type 2 diabetes mellitus without complications: Secondary | ICD-10-CM | POA: Diagnosis present

## 2014-07-06 DIAGNOSIS — I120 Hypertensive chronic kidney disease with stage 5 chronic kidney disease or end stage renal disease: Secondary | ICD-10-CM | POA: Diagnosis present

## 2014-07-06 DIAGNOSIS — E8779 Other fluid overload: Secondary | ICD-10-CM

## 2014-07-06 DIAGNOSIS — E875 Hyperkalemia: Secondary | ICD-10-CM

## 2014-07-06 DIAGNOSIS — B20 Human immunodeficiency virus [HIV] disease: Secondary | ICD-10-CM | POA: Diagnosis present

## 2014-07-06 DIAGNOSIS — Z9119 Patient's noncompliance with other medical treatment and regimen: Secondary | ICD-10-CM

## 2014-07-06 DIAGNOSIS — Z21 Asymptomatic human immunodeficiency virus [HIV] infection status: Secondary | ICD-10-CM | POA: Diagnosis present

## 2014-07-06 DIAGNOSIS — F17200 Nicotine dependence, unspecified, uncomplicated: Secondary | ICD-10-CM | POA: Diagnosis present

## 2014-07-06 DIAGNOSIS — Z91199 Patient's noncompliance with other medical treatment and regimen due to unspecified reason: Secondary | ICD-10-CM

## 2014-07-06 DIAGNOSIS — D5700 Hb-SS disease with crisis, unspecified: Secondary | ICD-10-CM

## 2014-07-06 DIAGNOSIS — R079 Chest pain, unspecified: Secondary | ICD-10-CM | POA: Diagnosis not present

## 2014-07-06 DIAGNOSIS — R112 Nausea with vomiting, unspecified: Secondary | ICD-10-CM

## 2014-07-06 DIAGNOSIS — N186 End stage renal disease: Secondary | ICD-10-CM

## 2014-07-06 DIAGNOSIS — D5701 Hb-SS disease with acute chest syndrome: Secondary | ICD-10-CM | POA: Diagnosis present

## 2014-07-06 DIAGNOSIS — Z79899 Other long term (current) drug therapy: Secondary | ICD-10-CM

## 2014-07-06 DIAGNOSIS — D571 Sickle-cell disease without crisis: Secondary | ICD-10-CM | POA: Diagnosis present

## 2014-07-06 LAB — COMPREHENSIVE METABOLIC PANEL
ALT: 9 U/L (ref 0–53)
AST: 20 U/L (ref 0–37)
Albumin: 2.7 g/dL — ABNORMAL LOW (ref 3.5–5.2)
Alkaline Phosphatase: 161 U/L — ABNORMAL HIGH (ref 39–117)
Anion gap: 24 — ABNORMAL HIGH (ref 5–15)
BUN: 75 mg/dL — ABNORMAL HIGH (ref 6–23)
CO2: 18 meq/L — AB (ref 19–32)
CREATININE: 12.3 mg/dL — AB (ref 0.50–1.35)
Calcium: 7.9 mg/dL — ABNORMAL LOW (ref 8.4–10.5)
Chloride: 96 mEq/L (ref 96–112)
GFR calc Af Amer: 5 mL/min — ABNORMAL LOW (ref >90)
GFR, EST NON AFRICAN AMERICAN: 5 mL/min — AB (ref >90)
Glucose, Bld: 102 mg/dL — ABNORMAL HIGH (ref 70–99)
Potassium: 5.7 mEq/L — ABNORMAL HIGH (ref 3.7–5.3)
Sodium: 138 mEq/L (ref 137–147)
Total Bilirubin: 0.3 mg/dL (ref 0.3–1.2)
Total Protein: 8.7 g/dL — ABNORMAL HIGH (ref 6.0–8.3)

## 2014-07-06 LAB — CBC WITH DIFFERENTIAL/PLATELET
BASOS ABS: 0 10*3/uL (ref 0.0–0.1)
Basophils Relative: 0 % (ref 0–1)
EOS ABS: 0.1 10*3/uL (ref 0.0–0.7)
EOS PCT: 1 % (ref 0–5)
HEMATOCRIT: 24.7 % — AB (ref 39.0–52.0)
Hemoglobin: 7.8 g/dL — ABNORMAL LOW (ref 13.0–17.0)
LYMPHS PCT: 22 % (ref 12–46)
Lymphs Abs: 0.8 10*3/uL (ref 0.7–4.0)
MCH: 29.5 pg (ref 26.0–34.0)
MCHC: 31.6 g/dL (ref 30.0–36.0)
MCV: 93.6 fL (ref 78.0–100.0)
MONO ABS: 0.3 10*3/uL (ref 0.1–1.0)
Monocytes Relative: 8 % (ref 3–12)
Neutro Abs: 2.4 10*3/uL (ref 1.7–7.7)
Neutrophils Relative %: 69 % (ref 43–77)
Platelets: 125 10*3/uL — ABNORMAL LOW (ref 150–400)
RBC: 2.64 MIL/uL — ABNORMAL LOW (ref 4.22–5.81)
RDW: 15.4 % (ref 11.5–15.5)
WBC: 3.5 10*3/uL — AB (ref 4.0–10.5)

## 2014-07-06 LAB — I-STAT CG4 LACTIC ACID, ED: LACTIC ACID, VENOUS: 1.06 mmol/L (ref 0.5–2.2)

## 2014-07-06 LAB — RETICULOCYTES
RBC.: 2.64 MIL/uL — ABNORMAL LOW (ref 4.22–5.81)
RETIC CT PCT: 1 % (ref 0.4–3.1)
Retic Count, Absolute: 26.4 10*3/uL (ref 19.0–186.0)

## 2014-07-06 MED ORDER — HEPARIN SODIUM (PORCINE) 5000 UNIT/ML IJ SOLN: 5000.0000 [IU] | Freq: Three times a day (TID) | INTRAMUSCULAR | Status: DC

## 2014-07-06 MED ORDER — NICOTINE 7 MG/24HR TD PT24: 7.0000 mg | MEDICATED_PATCH | Freq: Every day | TRANSDERMAL | Status: DC

## 2014-07-06 MED ORDER — FOLIC ACID 1 MG PO TABS: 1.0000 mg | ORAL_TABLET | Freq: Every day | ORAL | Status: DC

## 2014-07-06 MED ORDER — AMOXICILLIN-POT CLAVULANATE 875-125 MG PO TABS: 1.0000 | ORAL_TABLET | Freq: Two times a day (BID) | ORAL | Status: DC

## 2014-07-06 MED ORDER — PIPERACILLIN-TAZOBACTAM 3.375 G IVPB 30 MIN
3.3750 g | Freq: Once | INTRAVENOUS | Status: AC
  Administered 2014-07-06: 3.375 g via INTRAVENOUS
  Filled 2014-07-06: qty 50

## 2014-07-06 MED ORDER — PROMETHAZINE HCL 25 MG/ML IJ SOLN
25.0000 mg | Freq: Once | INTRAMUSCULAR | Status: AC
  Administered 2014-07-06: 25 mg via INTRAVENOUS
  Filled 2014-07-06: qty 1

## 2014-07-06 MED ORDER — DIPHENHYDRAMINE HCL 12.5 MG/5ML PO ELIX: 12.5000 mg | ORAL_SOLUTION | Freq: Four times a day (QID) | ORAL | Status: DC | PRN

## 2014-07-06 MED ORDER — KETOROLAC TROMETHAMINE 15 MG/ML IJ SOLN
15.0000 mg | Freq: Four times a day (QID) | INTRAMUSCULAR | Status: DC
Start: 2014-07-06 — End: 2014-07-06

## 2014-07-06 MED ORDER — ENALAPRILAT 1.25 MG/ML IV SOLN
1.2500 mg | Freq: Once | INTRAVENOUS | Status: AC
  Administered 2014-07-06: 1.25 mg via INTRAVENOUS
  Filled 2014-07-06: qty 1

## 2014-07-06 MED ORDER — HYDROMORPHONE HCL PF 1 MG/ML IJ SOLN
1.0000 mg | Freq: Once | INTRAMUSCULAR | Status: AC
  Administered 2014-07-06: 1 mg via INTRAVENOUS
  Filled 2014-07-06: qty 1

## 2014-07-06 MED ORDER — SODIUM CHLORIDE 0.9 % IJ SOLN: 9.0000 mL | INTRAMUSCULAR | Status: DC | PRN

## 2014-07-06 MED ORDER — VANCOMYCIN HCL IN DEXTROSE 1-5 GM/200ML-% IV SOLN: 1000.0000 mg | Freq: Once | INTRAVENOUS | Status: DC

## 2014-07-06 MED ORDER — HYDROXYUREA 500 MG PO CAPS: 500.0000 mg | ORAL_CAPSULE | Freq: Two times a day (BID) | ORAL | Status: DC

## 2014-07-06 MED ORDER — FAMOTIDINE IN NACL 20-0.9 MG/50ML-% IV SOLN: 20.0000 mg | Freq: Two times a day (BID) | INTRAVENOUS | Status: DC

## 2014-07-06 MED ORDER — LISINOPRIL 10 MG PO TABS: 10.0000 mg | ORAL_TABLET | Freq: Every day | ORAL | Status: DC

## 2014-07-06 MED ORDER — ONDANSETRON 8 MG/NS 50 ML IVPB: 8.0000 mg | Freq: Four times a day (QID) | INTRAVENOUS | Status: DC | PRN

## 2014-07-06 MED ORDER — HYDROMORPHONE HCL PF 1 MG/ML IJ SOLN: 1.0000 mg | Freq: Once | INTRAMUSCULAR | Status: DC

## 2014-07-06 MED ORDER — DIPHENHYDRAMINE HCL 25 MG PO CAPS: 50.0000 mg | ORAL_CAPSULE | Freq: Four times a day (QID) | ORAL | Status: DC | PRN

## 2014-07-06 MED ORDER — ONDANSETRON HCL 4 MG/2ML IJ SOLN
4.0000 mg | Freq: Once | INTRAMUSCULAR | Status: AC
  Administered 2014-07-06: 4 mg via INTRAVENOUS
  Filled 2014-07-06: qty 2

## 2014-07-06 MED ORDER — DIPHENHYDRAMINE HCL 50 MG/ML IJ SOLN: 12.5000 mg | Freq: Four times a day (QID) | INTRAMUSCULAR | Status: DC | PRN

## 2014-07-06 MED ORDER — HYDROMORPHONE HCL PF 1 MG/ML IJ SOLN
2.0000 mg | Freq: Once | INTRAMUSCULAR | Status: AC
  Administered 2014-07-06: 2 mg via INTRAVENOUS
  Filled 2014-07-06: qty 2

## 2014-07-06 MED ORDER — HYDROMORPHONE 0.3 MG/ML IV SOLN: INTRAVENOUS | Status: DC

## 2014-07-06 MED ORDER — ALPRAZOLAM 0.25 MG PO TABS: 0.2500 mg | ORAL_TABLET | Freq: Three times a day (TID) | ORAL | Status: DC | PRN

## 2014-07-06 MED ORDER — ONDANSETRON HCL 4 MG PO TABS: 8.0000 mg | ORAL_TABLET | ORAL | Status: DC | PRN

## 2014-07-06 MED ORDER — PROMETHAZINE HCL 25 MG PO TABS
25.0000 mg | ORAL_TABLET | Freq: Four times a day (QID) | ORAL | Status: DC | PRN
Start: 2014-07-06 — End: 2014-07-06

## 2014-07-06 MED ORDER — HYDROMORPHONE HCL 2 MG PO TABS: 8.0000 mg | ORAL_TABLET | ORAL | Status: DC | PRN

## 2014-07-06 MED ORDER — DIPHENHYDRAMINE HCL 50 MG/ML IJ SOLN
25.0000 mg | Freq: Once | INTRAMUSCULAR | Status: AC
  Administered 2014-07-06: 25 mg via INTRAVENOUS
  Filled 2014-07-06: qty 1

## 2014-07-06 MED ORDER — NALOXONE HCL 0.4 MG/ML IJ SOLN: 0.4000 mg | INTRAMUSCULAR | Status: DC | PRN

## 2014-07-06 MED ORDER — SODIUM CHLORIDE 0.9 % IV BOLUS (SEPSIS)
1000.0000 mL | Freq: Once | INTRAVENOUS | Status: AC
  Administered 2014-07-06: 1000 mL via INTRAVENOUS

## 2014-07-06 MED ORDER — PREDNISONE 20 MG PO TABS: 50.0000 mg | ORAL_TABLET | Freq: Every day | ORAL | Status: DC

## 2014-07-06 MED ORDER — CALCIUM ACETATE 667 MG PO CAPS: 2001.0000 mg | ORAL_CAPSULE | Freq: Three times a day (TID) | ORAL | Status: DC

## 2014-07-06 MED ORDER — FAMOTIDINE 20 MG PO TABS: 20.0000 mg | ORAL_TABLET | Freq: Two times a day (BID) | ORAL | Status: DC

## 2014-07-06 MED ORDER — SODIUM CHLORIDE 0.9 % IV SOLN: INTRAVENOUS | Status: DC

## 2014-07-06 MED ORDER — HYDROMORPHONE HCL PF 1 MG/ML IJ SOLN
1.0000 mg | Freq: Once | INTRAMUSCULAR | Status: AC
Start: 2014-07-06 — End: 2014-07-06
  Administered 2014-07-06: 1 mg via INTRAVENOUS
  Filled 2014-07-06: qty 1

## 2014-07-06 MED ORDER — PROMETHAZINE HCL 25 MG/ML IJ SOLN: 12.5000 mg | Freq: Once | INTRAMUSCULAR | Status: DC

## 2014-07-06 NOTE — Discharge Instructions (Signed)
I have warned you about leaving against medical advice and you have refused to stay and be admitted to the hospital - you have also promised to come back later today.  You MUST return to the hospital to be admitted later today! - you have Promised - do the right thing and return later today.

## 2014-07-06 NOTE — ED Provider Notes (Addendum)
CSN: [id number]     Arrival date & time 07/06/14  0115 History   First MD Initiated Contact with Patient 07/06/14 0217     Chief Complaint  Patient presents with  . Sickle Cell Pain Crisis  . Nausea  . Emesis     (Consider location/radiation/quality/duration/timing/severity/associated sxs/prior Treatment) HPI Comments: 33 year old male with a history of sickle cell anemia. He states that over the last several days, he has had significant increasing pain in his bilateral legs and hips as well as his lower back. He denies fevers chills and coughing shortness of breath or chest pain. He does have nausea and vomiting which has prevented him from taking his blood pressure medications or his pain medications. He has had 2 prior visits and the prior 48 hours with good control of his symptoms however he is unable to control his symptoms at home. He has in frequent visits to the emergency department. At this time his symptoms are severe, nothing makes it better, worse with palpation of his lower back and his bilateral lower extremities.  Patient is a 33 y.o. male presenting with sickle cell pain and vomiting. The history is provided by the patient.  Sickle Cell Pain Crisis Associated symptoms: vomiting   Emesis   Past Medical History  Diagnosis Date  . Hypertension   . Diabetes mellitus   . Sickle cell anemia   . Renal failure   . Renal failure   . HIV (human immunodeficiency virus infection)   . Blood transfusion    Past Surgical History  Procedure Laterality Date  . Hip surgery      "screw placed into joint"  . Av fistula placement     Family History  Problem Relation Age of Onset  . Cervical cancer Mother    History  Substance Use Topics  . Smoking status: Current Every Day Smoker -- 0.25 packs/day for 10 years    Types: Cigarettes  . Smokeless tobacco: Never Used  . Alcohol Use: No    Review of Systems  Gastrointestinal: Positive for vomiting.  All other systems reviewed  and are negative.     Allergies  Review of patient's allergies indicates no known allergies.  Home Medications   Prior to Admission medications   Medication Sig Start Date End Date Taking? Authorizing Provider  amoxicillin-clavulanate (AUGMENTIN) 875-125 MG per tablet Take 1 tablet by mouth every 12 (twelve) hours. 07/06/14   Vida Roller, MD  b complex-vitamin c-folic acid (NEPHRO-VITE) 0.8 MG TABS tablet Take 1 tablet by mouth at bedtime.    Historical Provider, MD  calcium acetate (PHOSLO) 667 MG capsule Take 2,001 mg by mouth 3 (three) times daily with meals.    Historical Provider, MD  diphenhydrAMINE (BENADRYL) 50 MG capsule Take 50 mg by mouth every 6 (six) hours as needed for itching or allergies.    Historical Provider, MD  folic acid (FOLVITE) 1 MG tablet Take 1 mg by mouth daily.    Historical Provider, MD  HYDROmorphone (DILAUDID) 8 MG tablet Take 8 mg by mouth every 4 (four) hours as needed for severe pain.    Historical Provider, MD  hydroxyurea (HYDREA) 500 MG capsule Take 500 mg by mouth 2 (two) times daily. May take with food to minimize GI side effects.    Historical Provider, MD  lisinopril (PRINIVIL,ZESTRIL) 10 MG tablet Take 10 mg by mouth daily.    Historical Provider, MD  promethazine (PHENERGAN) 25 MG tablet Take 25 mg by mouth every 6 (six)  hours as needed for nausea or vomiting.    Historical Provider, MD   BP 167/66  Pulse 96  Temp(Src) 98.6 F (37 C) (Oral)  Resp 23  Ht 5\' 7"  (1.702 m)  Wt 180 lb (81.647 kg)  BMI 28.19 kg/m2  SpO2 92% Physical Exam  Nursing note and vitals reviewed. Constitutional: He appears well-developed and well-nourished. No distress.  HENT:  Head: Normocephalic and atraumatic.  Mouth/Throat: Oropharynx is clear and moist. No oropharyngeal exudate.  Eyes: Conjunctivae and EOM are normal. Pupils are equal, round, and reactive to light. Right eye exhibits no discharge. Left eye exhibits no discharge. No scleral icterus.  Neck:  Normal range of motion. Neck supple. No JVD present. No thyromegaly present.  Cardiovascular: Normal rate, regular rhythm, normal heart sounds and intact distal pulses.  Exam reveals no gallop and no friction rub.   No murmur heard. Left upper extremity dialysis access with good thrill  Pulmonary/Chest: Effort normal and breath sounds normal. No respiratory distress. He has no wheezes. He has no rales.  Abdominal: Soft. Bowel sounds are normal. He exhibits no distension and no mass. There is no tenderness.  Musculoskeletal: Normal range of motion. He exhibits edema (mild 1+ pitting edema bilaterally) and tenderness (tenderness to the lower back in the bilateral lower extremities, soft compartments, supple joints).  Lymphadenopathy:    He has no cervical adenopathy.  Neurological: He is alert. Coordination normal.  Skin: Skin is warm and dry. No rash noted. No erythema.  Psychiatric: He has a normal mood and affect. His behavior is normal.    ED Course  Procedures (including critical care time) Labs Review Labs Reviewed  CBC WITH DIFFERENTIAL - Abnormal; Notable for the following:    WBC 3.5 (*)    RBC 2.64 (*)    Hemoglobin 7.8 (*)    HCT 24.7 (*)    Platelets 125 (*)    All other components within normal limits  COMPREHENSIVE METABOLIC PANEL - Abnormal; Notable for the following:    Potassium 5.7 (*)    CO2 18 (*)    Glucose, Bld 102 (*)    BUN 75 (*)    Creatinine, Ser 12.30 (*)    Calcium 7.9 (*)    Total Protein 8.7 (*)    Albumin 2.7 (*)    Alkaline Phosphatase 161 (*)    GFR calc non Af Amer 5 (*)    GFR calc Af Amer 5 (*)    Anion gap 24 (*)    All other components within normal limits  RETICULOCYTES - Abnormal; Notable for the following:    RBC. 2.64 (*)    All other components within normal limits  CULTURE, BLOOD (ROUTINE X 2)  CULTURE, BLOOD (ROUTINE X 2)  CULTURE, EXPECTORATED SPUTUM-ASSESSMENT  LACTATE DEHYDROGENASE  I-STAT CG4 LACTIC ACID, ED    Imaging  Review Dg Chest 2 View  07/06/2014   CLINICAL DATA:  Sickle cell crisis.  History of smoking.  EXAM: CHEST  2 VIEW  COMPARISON:  None.  FINDINGS: The lungs are well-aerated. Patchy bilateral airspace opacities likely reflect acute chest syndrome, given the patient's symptoms. There is no evidence of pleural effusion or pneumothorax.  The heart is borderline normal in size; the mediastinal contour is within normal limits. No acute osseous abnormalities are seen. There appears to be some degree of chronic resorption involving the distal clavicles bilaterally. A vascular stent is noted overlying the right lung apex. An additional vascular stent is seen along the proximal  left arm.  IMPRESSION: Patchy bilateral airspace opacities likely reflect acute chest syndrome, given the patient's symptoms.   Electronically Signed   By: Roanna RaiderJeffery  Chang M.D.   On: 07/06/2014 03:57    EKG Interpretation  Date/Time:  Sunday July 06 2014 05:28:40 EDT Ventricular Rate:  98 PR Interval:  173 QRS Duration: 91 QT Interval:  397 QTC Calculation: 507 R Axis:   5 Text Interpretation:  Sinus rhythm Low voltage, extremity and precordial leads Consider anterior infarct Prolonged QT interval Since last tracing No significant change was found Confirmed by Lenay Lovejoy  MD, Whitnie Deleon ([id number]) on 07/06/2014 5:40:20 AM       MDM   Final diagnoses:  Acute chest syndrome  Hyperkalemia  ESRD (end stage renal disease)  Severe hypertension  Non-intractable vomiting with nausea, vomiting of unspecified type    The patient is hypertensive, he will receive intravenous enalapril at, labs, pain medications and likely admission to the hospital for uncontrolled sickle cell pain.  Laboratory workup reveals leukopenia, anemia with a hemoglobin of 7.8, potassium of 5.7 and a slight acidosis. His chest x-ray reveals infiltrates consistent with acute chest syndrome. He has had persistent hypertension, persistent nausea and vomiting and require  admission to the hospital for antibiotics and blood pressure control.  Discussed with hospitalist who agrees and will admit. (Dr. Allena KatzPatel)  0630, the pt now states that he can't be admitted and that he has something to do at home - he refuses to say what this is - he has been extensively warned about the mortality and morbidity that occur with acute chest syndrome, he still refuses to stay - he agrees to come back later this morning for admission.  Dr. Allena KatzPatel was informed.  Meds given in ED:  Medications  vancomycin (VANCOCIN) IVPB 1000 mg/200 mL premix (not administered)  hydroxyurea (HYDREA) capsule 500 mg (not administered)  folic acid (FOLVITE) tablet 1 mg (not administered)  lisinopril (PRINIVIL,ZESTRIL) tablet 10 mg (not administered)  calcium acetate (PHOSLO) capsule 2,001 mg (not administered)  HYDROmorphone (DILAUDID) tablet 8 mg (not administered)  diphenhydrAMINE (BENADRYL) capsule 50 mg (not administered)  promethazine (PHENERGAN) tablet 25 mg (not administered)  folic acid (FOLVITE) tablet 1 mg (not administered)  heparin injection 5,000 Units (not administered)  0.9 %  sodium chloride infusion (not administered)  ketorolac (TORADOL) 15 MG/ML injection 15 mg (not administered)  ALPRAZolam (XANAX) tablet 0.25-0.5 mg (not administered)  ondansetron (ZOFRAN) tablet 8 mg (not administered)    Or  ondansetron (ZOFRAN) 8 mg/NS 50 ml IVPB (not administered)  famotidine (PEPCID) tablet 20 mg (not administered)    Or  famotidine (PEPCID) IVPB 20 mg (not administered)  nicotine (NICODERM CQ - dosed in mg/24 hr) patch 7 mg (not administered)  promethazine (PHENERGAN) injection 12.5 mg (not administered)  naloxone (NARCAN) injection 0.4 mg (not administered)    And  sodium chloride 0.9 % injection 9 mL (not administered)  diphenhydrAMINE (BENADRYL) injection 12.5 mg (not administered)    Or  diphenhydrAMINE (BENADRYL) 12.5 MG/5ML elixir 12.5 mg (not administered)  HYDROmorphone  (DILAUDID) PCA injection 0.3 mg/mL (not administered)  predniSONE (DELTASONE) tablet 50 mg (not administered)  sodium chloride 0.9 % bolus 1,000 mL (1,000 mLs Intravenous Transfusing/Transfer 07/06/14 0622)  diphenhydrAMINE (BENADRYL) injection 25 mg (25 mg Intravenous Given 07/06/14 0302)  HYDROmorphone (DILAUDID) injection 1 mg (1 mg Intravenous Given 07/06/14 0304)  ondansetron (ZOFRAN) injection 4 mg (4 mg Intravenous Given 07/06/14 0304)  enalaprilat (VASOTEC) injection 1.25 mg (1.25 mg Intravenous Given 07/06/14  0304)  HYDROmorphone (DILAUDID) injection 1 mg (1 mg Intravenous Given 07/06/14 0330)  HYDROmorphone (DILAUDID) injection 2 mg (2 mg Intravenous Given 07/06/14 0357)  promethazine (PHENERGAN) injection 25 mg (25 mg Intravenous Given 07/06/14 0424)  piperacillin-tazobactam (ZOSYN) IVPB 3.375 g (0 g Intravenous Stopped 07/06/14 0622)  HYDROmorphone (DILAUDID) injection 2 mg (2 mg Intravenous Given 07/06/14 0511)  diphenhydrAMINE (BENADRYL) injection 25 mg (25 mg Intravenous Given 07/06/14 0512)  HYDROmorphone (DILAUDID) injection 1 mg (1 mg Intravenous Given 07/06/14 0614)    New Prescriptions   AMOXICILLIN-CLAVULANATE (AUGMENTIN) 875-125 MG PER TABLET    Take 1 tablet by mouth every 12 (twelve) hours.      Vida Roller, MD 07/06/14 [id number]  Vida Roller, MD 07/06/14 [phone]

## 2014-07-06 NOTE — ED Notes (Signed)
Hospitalist at bedside 

## 2014-07-06 NOTE — ED Notes (Signed)
After receiving phone call, pt states that he is unable to stay. States he does not want to be admitted at this time, states "I've got something to do." EDP notified.

## 2014-07-06 NOTE — ED Notes (Signed)
Patient states that he is hurting in all his joints from his sickle cell and has been nauseated with vomiting

## 2014-07-06 NOTE — H&P (Addendum)
[facility] History and Physical  Patient: [name]  ZOX:[id number]  DOB: 08-Nov-1981  DOS: the patient was seen and examined on 07/06/2014 PCP: No PCP Per Patient  Chief Complaint: Bilateral leg pain  HPI: [name] is a 33 y.o. male with Past medical history of hypertension, sickle cell anemia, blood transfusion, HIV positive not on any treatment, ESRD on hemodialysis at home Monday Wednesday Friday, chronic opioid use. Patient presents with complaints of bilateral leg pain. He mentions since last 3 days he has been having progressively worsening bilateral leg pain, it is difficult for him to bed any weight on it. Right right knee hurts more than the left. He mentions it Feels likely is dull ache that he gets with his sickle cell crisis. His last sickle cell crisis was one year ago at which time he was admitted in [facility] hospital [place]. He mentions is sickle cell doctor is also in [place]. He mentions he gets his medication prescription by either way in to urgent care or by going to [place] to get his supply. He also mentions he gets his dialysis at home, and has never missed any treatment. He mentions he generally gets blood transmission when his hemoglobin is less than 6. He also mentions whenever he goes in the hospital with sickle cell crisis he gets 4 mg Dilaudid every 3 hour IV as a regimen. He denies any active bleeding. He has a chronic cough. He is actively smoking one pack for one week. He denies any alcohol or drug abuse. He denies any fever or chills. He denies any chest pain or shortness of breath. He complains of nausea and had few episodes of vomiting without any blood at home has some left upper abdominal pain which he mentions as a dull ache. No diarrhea or constipation. No swelling of his leg more than his usual. He does not urinate. He mentions he is compliant with his medication and there is no recent change in his  medications. No sick contacts. No recent hospitalization. Patient also denies that he has any history of HIV positive.  The patient is coming from home. And at his baseline independent for most of his ADL.  Review of Systems: as mentioned in the history of present illness.  A Comprehensive review of the other systems is negative.  Past Medical History  Diagnosis Date  . Hypertension   . Diabetes mellitus   . Sickle cell anemia   . Renal failure   . Renal failure   . HIV (human immunodeficiency virus infection)   . Blood transfusion    Past Surgical History  Procedure Laterality Date  . Hip surgery      "screw placed into joint"  . Av fistula placement     Social History:  reports that he has been smoking Cigarettes.  He has a 2.5 pack-year smoking history. He has never used smokeless tobacco. He reports that he does not drink alcohol or use illicit drugs.  No Known Allergies  Family History  Problem Relation Age of Onset  . Cervical cancer Mother     Prior to Admission medications   Medication Sig Start Date End Date Taking? Authorizing Provider  b complex-vitamin c-folic acid (NEPHRO-VITE) 0.8 MG TABS tablet Take 1 tablet by mouth at bedtime.    Historical Provider, MD  calcium acetate (PHOSLO) 667 MG capsule Take 2,001 mg by mouth 3 (three) times daily with meals.    Historical Provider, MD  diphenhydrAMINE (BENADRYL) 50 MG capsule Take 50 mg by mouth every 6 (six) hours as needed for itching or allergies.    Historical Provider, MD  folic acid (FOLVITE) 1 MG tablet Take 1 mg by mouth daily.    Historical Provider, MD  HYDROmorphone (DILAUDID) 8 MG tablet Take 8 mg by mouth every 4 (four) hours as needed for severe pain.    Historical Provider, MD  hydroxyurea (HYDREA) 500 MG capsule Take 500 mg by mouth 2 (two) times daily. May take with food to minimize GI side effects.    Historical Provider, MD  lisinopril (PRINIVIL,ZESTRIL) 10 MG tablet Take 10 mg by mouth daily.     Historical Provider, MD  promethazine (PHENERGAN) 25 MG tablet Take 25 mg by mouth every 6 (six) hours as needed for nausea or vomiting.    Historical Provider, MD    Physical Exam: Filed Vitals:   07/06/14 0430 07/06/14 0450 07/06/14 0530 07/06/14 0600  BP: 220/100 183/69 166/62 167/66  Pulse: 99 102 99 96  Temp:      TempSrc:      Resp: 12  18 23   Height:      Weight:      SpO2: 96% 99% 99% 92%    General: Alert, Awake and Oriented to Time, Place and Person. Appear in moderate distress Eyes: PERRL ENT: Oral Mucosa clear moist. Neck: No  JVD Cardiovascular: S1 and S2 Present, aortic systolic  Murmur, Peripheral Pulses Present Respiratory: Bilateral Air entry equal and Decreased, faint bilateral crackles, no  wheezes Abdomen: Bowel Sound Present, Soft and Non tender Skin: No  Rash Extremities: Bilateral Pedal edema, no  calf tenderness, tenderness at ankle and knee joint. Right knee more swollen and warm. No tenderness at the hip.  Neurologic: Grossly no focal neuro deficit.  Labs on Admission:  CBC:  Recent Labs Lab 07/04/14 0319 07/05/14 1306 07/06/14 0253  WBC 3.1* 2.4* 3.5*  NEUTROABS 2.0 1.5* 2.4  HGB 8.2* 7.8* 7.8*  HCT 25.9* 25.0* 24.7*  MCV 91.5 91.9 93.6  PLT 139* 130* 125*    CMP     Component Value Date/Time   NA 138 07/06/2014 0253   K 5.7* 07/06/2014 0253   CL 96 07/06/2014 0253   CO2 18* 07/06/2014 0253   GLUCOSE 102* 07/06/2014 0253   BUN 75* 07/06/2014 0253   CREATININE 12.30* 07/06/2014 0253   CALCIUM 7.9* 07/06/2014 0253   PROT 8.7* 07/06/2014 0253   ALBUMIN 2.7* 07/06/2014 0253   AST 20 07/06/2014 0253   ALT 9 07/06/2014 0253   ALKPHOS 161* 07/06/2014 0253   BILITOT 0.3 07/06/2014 0253   GFRNONAA 5* 07/06/2014 0253   GFRAA 5* 07/06/2014 0253     Recent Labs Lab 07/05/14 1304  LIPASE 92*   No results found for this basename: AMMONIA,  in the last 168 hours  No results found for this basename: CKTOTAL, CKMB, CKMBINDEX, TROPONINI,  in the last  168 hours BNP (last 3 results) No results found for this basename: PROBNP,  in the last 8760 hours  Radiological Exams on Admission: Dg Chest 2 View  07/06/2014   CLINICAL DATA:  Sickle cell crisis.  History of smoking.  EXAM: CHEST  2 VIEW  COMPARISON:  None.  FINDINGS: The lungs are well-aerated. Patchy bilateral airspace opacities likely reflect acute chest syndrome, given the patient's symptoms. There is no evidence of pleural effusion or pneumothorax.  The heart is borderline normal in size; the mediastinal contour is within normal limits.  No acute osseous abnormalities are seen. There appears to be some degree of chronic resorption involving the distal clavicles bilaterally. A vascular stent is noted overlying the right lung apex. An additional vascular stent is seen along the proximal left arm.  IMPRESSION: Patchy bilateral airspace opacities likely reflect acute chest syndrome, given the patient's symptoms.   Electronically Signed   By: Roanna Raider M.D.   On: 07/06/2014 03:57   EKG: Independently reviewed. sinus tachycardia. Assessment/Plan Principal Problem:   Acute chest syndrome Active Problems:   ESRD on dialysis   HTN (hypertension)   HIV (human immunodeficiency virus infection)   History of noncompliance with medical treatment   1. Acute chest syndrome Mild hypoxia Patient is presenting with complaints of bilateral knee pain. On further evaluation he was found to be tachypneic and tachycardic. Therefore a chest x-ray and EKG was performed. EKG shows sinus tachycardia chest x-ray shows bilateral infiltrate suggestive of either acute chest syndrome versus acute pneumonia. This could also represent volume overload due to his history of noncompliance with hemodialysis sessions. Currently he he will be admitted in the hospital. He will be started on broad-spectrum antibiotic regimen vancomycin and Zosyn. Due to his questionable history of HIV disease ID would also be consulted.   Check LDH. Patient initially requested to provide pain medications intravenously every few hour, but since he has been in the ED he has requested pain medications every 30 minutes therefore for safety For his pain management he will be started on a PCA pump protocol which he has agreed at present.  Although he mentions to 2mg  of Dilaudid would not be effective for him as he used to get 4 mg every 3 hourly and his prior hospitalization. Attempted to locate [facility] hospital in [facility], but the closest [facility] is in [place] thus we need to verify with the patient. Pharmacy would also need to verify his medication refill history but at present would also continue his home pain regimen. Monitor H&H, transfuse if less than 6. Will need a sickle cell service consultation.  2.Abdominal pain. Lipase is negative. We'll continue to monitor at present.  3.ESRD on hemodialysis. Patient is on hemodialysis Monday Wednesday Friday mention he has not missed any of his hemodialysis treatment. His chest x-ray would also appear volume overloaded Nephrology will be consulted him in the morning for a combination of his hemodialysis from Monday. Strict ins and outs.  4.Accelerated hypertension. Continue home medications IV hydralazine as as needed.  Consults: Nephrologic, infectious disease  DVT Prophylaxis: subcutaneous Heparin Nutrition: Renal diet  Code Status: Full  Disposition: Admitted to inpatient in telemetry unit.  Author: Lynden Oxford, MD [facility] Pager: [phone] 07/06/2014, 6:17 AM    If 7PM-7AM, please contact night-coverage [email or web address] Password [email or web address]  **Disclaimer: This note may have been dictated with voice recognition software. Similar sounding words can inadvertently be transcribed and this note may contain transcription errors which may not have been corrected upon publication of note.**   Addendum: ED physician called me that the patient has decided to  leave AGAINST MEDICAL ADVICE. Patient was explained about possible risk including death about leaving AMA with his current condition and medications that he has received for the treatment of same. Patient is handed back to ED physician.  Author: Lynden Oxford, MD [facility] Pager: [phone] 07/06/2014 6:42 AM

## 2014-07-06 NOTE — ED Notes (Signed)
Pt states he wants to check out against medical advice at this time, states, "I just want my prescription, I don't want the rest of those papers." referring to d/c papers. Admitting MD and EDP aware.

## 2014-07-06 NOTE — ED Notes (Signed)
Pt refused discharge vitals 

## 2014-07-06 NOTE — ED Notes (Signed)
Pt requesting that staff given him a few minutes of privacy prior to taking him upstairs in order to make phone call. RN stepped out of room

## 2014-07-07 ENCOUNTER — Encounter (HOSPITAL_COMMUNITY): Payer: Self-pay | Admitting: Emergency Medicine

## 2014-07-07 DIAGNOSIS — E119 Type 2 diabetes mellitus without complications: Secondary | ICD-10-CM | POA: Insufficient documentation

## 2014-07-07 DIAGNOSIS — R079 Chest pain, unspecified: Secondary | ICD-10-CM | POA: Diagnosis present

## 2014-07-07 DIAGNOSIS — N186 End stage renal disease: Secondary | ICD-10-CM | POA: Insufficient documentation

## 2014-07-07 DIAGNOSIS — F17200 Nicotine dependence, unspecified, uncomplicated: Secondary | ICD-10-CM | POA: Diagnosis not present

## 2014-07-07 DIAGNOSIS — I120 Hypertensive chronic kidney disease with stage 5 chronic kidney disease or end stage renal disease: Secondary | ICD-10-CM | POA: Insufficient documentation

## 2014-07-07 DIAGNOSIS — D57819 Other sickle-cell disorders with crisis, unspecified: Secondary | ICD-10-CM | POA: Insufficient documentation

## 2014-07-07 DIAGNOSIS — D5701 Hb-SS disease with acute chest syndrome: Secondary | ICD-10-CM | POA: Diagnosis not present

## 2014-07-07 DIAGNOSIS — Z79899 Other long term (current) drug therapy: Secondary | ICD-10-CM | POA: Diagnosis not present

## 2014-07-07 DIAGNOSIS — E875 Hyperkalemia: Secondary | ICD-10-CM | POA: Insufficient documentation

## 2014-07-07 DIAGNOSIS — Z21 Asymptomatic human immunodeficiency virus [HIV] infection status: Secondary | ICD-10-CM | POA: Insufficient documentation

## 2014-07-07 NOTE — ED Notes (Signed)
Pt refused blood work in triage.  

## 2014-07-07 NOTE — ED Notes (Signed)
Pt states seen yesterday (left AMA) pt was having CP and sickle cell pain. Pt nauseated and the pain got worse so he came back in to be seen.  pt states normal joint and back pain for sickle cell same as today.

## 2014-07-08 ENCOUNTER — Emergency Department (HOSPITAL_COMMUNITY)
Admission: EM | Admit: 2014-07-08 | Discharge: 2014-07-08 | Disposition: A | Discharge status: Home / Self Care | Payer: Medicare Other | Attending: Emergency Medicine | Admitting: Emergency Medicine

## 2014-07-08 ENCOUNTER — Emergency Department (HOSPITAL_COMMUNITY): Payer: Medicare Other

## 2014-07-08 DIAGNOSIS — E875 Hyperkalemia: Secondary | ICD-10-CM

## 2014-07-08 DIAGNOSIS — D649 Anemia, unspecified: Secondary | ICD-10-CM

## 2014-07-08 DIAGNOSIS — D5700 Hb-SS disease with crisis, unspecified: Secondary | ICD-10-CM

## 2014-07-08 DIAGNOSIS — D57819 Other sickle-cell disorders with crisis, unspecified: Secondary | ICD-10-CM | POA: Diagnosis not present

## 2014-07-08 DIAGNOSIS — D5701 Hb-SS disease with acute chest syndrome: Secondary | ICD-10-CM

## 2014-07-08 LAB — CBC WITH DIFFERENTIAL/PLATELET
BASOS ABS: 0 10*3/uL (ref 0.0–0.1)
Basophils Relative: 1 % (ref 0–1)
EOS ABS: 0 10*3/uL (ref 0.0–0.7)
Eosinophils Relative: 1 % (ref 0–5)
HCT: 22.9 % — ABNORMAL LOW (ref 39.0–52.0)
HEMOGLOBIN: 7.4 g/dL — AB (ref 13.0–17.0)
LYMPHS PCT: 18 % (ref 12–46)
Lymphs Abs: 0.7 10*3/uL (ref 0.7–4.0)
MCH: 29.2 pg (ref 26.0–34.0)
MCHC: 32.3 g/dL (ref 30.0–36.0)
MCV: 90.5 fL (ref 78.0–100.0)
Monocytes Absolute: 0.3 10*3/uL (ref 0.1–1.0)
Monocytes Relative: 7 % (ref 3–12)
NEUTROS PCT: 73 % (ref 43–77)
Neutro Abs: 2.7 10*3/uL (ref 1.7–7.7)
Platelets: 110 10*3/uL — ABNORMAL LOW (ref 150–400)
RBC: 2.53 MIL/uL — ABNORMAL LOW (ref 4.22–5.81)
RDW: 15 % (ref 11.5–15.5)
SMEAR REVIEW: DECREASED
WBC: 3.7 10*3/uL — ABNORMAL LOW (ref 4.0–10.5)

## 2014-07-08 LAB — COMPREHENSIVE METABOLIC PANEL
ALT: 9 U/L (ref 0–53)
ANION GAP: 22 — AB (ref 5–15)
AST: 17 U/L (ref 0–37)
Albumin: 3 g/dL — ABNORMAL LOW (ref 3.5–5.2)
Alkaline Phosphatase: 142 U/L — ABNORMAL HIGH (ref 39–117)
BILIRUBIN TOTAL: 0.5 mg/dL (ref 0.3–1.2)
BUN: 96 mg/dL — AB (ref 6–23)
CHLORIDE: 93 meq/L — AB (ref 96–112)
CO2: 16 meq/L — AB (ref 19–32)
CREATININE: 14.83 mg/dL — AB (ref 0.50–1.35)
Calcium: 8.3 mg/dL — ABNORMAL LOW (ref 8.4–10.5)
GFR calc Af Amer: 4 mL/min — ABNORMAL LOW (ref >90)
GFR, EST NON AFRICAN AMERICAN: 4 mL/min — AB (ref >90)
Glucose, Bld: 70 mg/dL (ref 70–99)
Potassium: 6.4 mEq/L — ABNORMAL HIGH (ref 3.7–5.3)
Sodium: 131 mEq/L — ABNORMAL LOW (ref 137–147)
Total Protein: 9.2 g/dL — ABNORMAL HIGH (ref 6.0–8.3)

## 2014-07-08 LAB — RETICULOCYTES
RBC.: 2.53 MIL/uL — AB (ref 4.22–5.81)
RETIC CT PCT: 0.5 % (ref 0.4–3.1)
Retic Count, Absolute: 12.7 10*3/uL — ABNORMAL LOW (ref 19.0–186.0)

## 2014-07-08 MED ORDER — HYDROMORPHONE HCL PF 1 MG/ML IJ SOLN
1.0000 mg | Freq: Once | INTRAMUSCULAR | Status: AC
  Administered 2014-07-08: 1 mg via INTRAVENOUS
  Filled 2014-07-08: qty 1

## 2014-07-08 MED ORDER — HYDROMORPHONE HCL PF 1 MG/ML IJ SOLN
2.0000 mg | Freq: Once | INTRAMUSCULAR | Status: AC
  Administered 2014-07-08: 2 mg via INTRAVENOUS
  Filled 2014-07-08: qty 2

## 2014-07-08 MED ORDER — PROMETHAZINE HCL 25 MG/ML IJ SOLN
12.5000 mg | Freq: Once | INTRAMUSCULAR | Status: AC
  Administered 2014-07-08: 12.5 mg via INTRAVENOUS
  Filled 2014-07-08: qty 1

## 2014-07-08 MED ORDER — SODIUM CHLORIDE 0.9 % IV BOLUS (SEPSIS): 1000.0000 mL | Freq: Once | INTRAVENOUS | Status: DC

## 2014-07-08 MED ORDER — ONDANSETRON HCL 4 MG/2ML IJ SOLN
4.0000 mg | Freq: Once | INTRAMUSCULAR | Status: AC
  Administered 2014-07-08: 4 mg via INTRAVENOUS
  Filled 2014-07-08: qty 2

## 2014-07-08 MED ORDER — DIPHENHYDRAMINE HCL 50 MG/ML IJ SOLN
25.0000 mg | Freq: Once | INTRAMUSCULAR | Status: AC
  Administered 2014-07-08: 25 mg via INTRAVENOUS
  Filled 2014-07-08: qty 1

## 2014-07-08 MED ORDER — SODIUM POLYSTYRENE SULFONATE 15 GM/60ML PO SUSP
30.0000 g | Freq: Once | ORAL | Status: AC
  Administered 2014-07-08: 30 g via ORAL
  Filled 2014-07-08: qty 120

## 2014-07-08 NOTE — Discharge Instructions (Signed)
Hyperkalemia °Hyperkalemia is when you have too much potassium in your blood. This can be a life-threatening condition. Potassium is normally removed (excreted) from the body by the kidneys. °CAUSES  °The potassium level in your body can become too high for the following reasons: °· You take in too much potassium. You can do this by: °¨ Using salt substitutes. They contain large amounts of potassium. °¨ Taking potassium supplements from your caregiver. The dose may be too high for you. °¨ Eating foods or taking nutritional products with potassium. °· You excrete too little potassium. This can happen if: °¨ Your kidneys are not functioning properly. Kidney (renal) disease is a very common cause of hyperkalemia. °¨ You are taking medicines that lower your excretion of potassium, such as certain diuretic medicines. °¨ You have an adrenal gland disease called Addison's disease. °¨ You have a urinary tract obstruction, such as kidney stones. °¨ You are on treatment to mechanically clean your blood (dialysis) and you skip a treatment. °· You release a high amount of potassium from your cells into your blood. You may have a condition that causes potassium to move from your cells to your bloodstream. This can happen with: °¨ Injury to muscles or other tissues. Most potassium is stored in the muscles. °¨ Severe burns or infections. °¨ Acidic blood plasma (acidosis). Acidosis can result from many diseases, such as uncontrolled diabetes. °SYMPTOMS  °Usually, there are no symptoms unless the potassium is dangerously high or has risen very quickly. Symptoms may include: °· Irregular or very slow heartbeat. °· Feeling sick to your stomach (nauseous). °· Tiredness (fatigue). °· Nerve problems such as tingling of the skin, numbness of the hands or feet, weakness, or paralysis. °DIAGNOSIS  °A simple blood test can measure the amount of potassium in your body. An electrocardiogram test of the heart can also help make the diagnosis.  The heart may beat dangerously fast or slow down and stop beating with severe hyperkalemia.  °TREATMENT  °Treatment depends on how bad the condition is and on the underlying cause. °· If the hyperkalemia is an emergency (causing heart problems or paralysis), many different medicines can be used alone or together to lower the potassium level briefly. This may include an insulin injection even if you are not diabetic. Emergency dialysis may be needed to remove potassium from the body. °· If the hyperkalemia is less severe or dangerous, the underlying cause is treated. This can include taking medicines if needed. Your prescription medicines may be changed. You may also need to take a medicine to help your body get rid of potassium. You may need to eat a diet low in potassium. °HOME CARE INSTRUCTIONS  °· Take medicines and supplements as directed by your caregiver. °· Do not take any over-the-counter medicines, supplements, natural products, herbs, or vitamins without reviewing them with your caregiver. Certain supplements and natural food products can have high amounts of potassium. Other products (such as ibuprofen) can damage weak kidneys and raise your potassium. °· You may be asked to do repeat lab tests. Be sure to follow these directions. °· If you have kidney disease, you may need to follow a low potassium diet. °SEEK MEDICAL CARE IF:  °· You notice an irregular or very slow heartbeat. °· You feel lightheaded. °· You develop weakness that is unusual for you. °SEEK IMMEDIATE MEDICAL CARE IF:  °· You have shortness of breath. °· You have chest discomfort. °· You pass out (faint). °MAKE SURE YOU:  °· Understand   these instructions. °· Will watch your condition. °· Will get help right away if you are not doing well or get worse. °Document Released: 11/25/2002 Document Revised: 02/27/2012 Document Reviewed: 05/12/2011 °ExitCare® Patient Information ©2015 [employer]. This information is not intended to replace  advice given to you by your health care provider. Make sure you discuss any questions you have with your health care provider. ° °

## 2014-07-08 NOTE — ED Notes (Signed)
Pt refused blood work in triage. phlebotomist called pt still refused to be stuck.

## 2014-07-08 NOTE — ED Provider Notes (Signed)
CSN: [id number]     Arrival date & time 07/07/14  2245 History   First MD Initiated Contact with Patient 07/08/14 0105     Chief Complaint  Patient presents with  . Chest Pain  . Sickle Cell Pain Crisis     (Consider location/radiation/quality/duration/timing/severity/associated sxs/prior Treatment) HPI  This is a 33 year old man with history of Hgb SS, ESRD, DM and HIV. He comes in with complaints of sickle cell crisis pain. He has pain in all of his joints and this is typical of a sickle cell crisis. He rates his pain 10/10. He has been taking his home meds without adequate relief. Denies fever.   He was seen in this ED a couple of days ago and diagnosed with acute chest syndrome based on bibasilar infiltrates. However, he refused admission and left AMA. He says he was prescribed an antibiotic and has been compliant with this. He denies experiencing chest pain or SOB. Denies fever.   Says he dialyzed 2d ago, on schedule. But, did not get full run because he felt weak.   Past Medical History  Diagnosis Date  . Hypertension   . Diabetes mellitus   . Sickle cell anemia   . Renal failure   . Renal failure   . HIV (human immunodeficiency virus infection)   . Blood transfusion    Past Surgical History  Procedure Laterality Date  . Hip surgery      "screw placed into joint"  . Av fistula placement     Family History  Problem Relation Age of Onset  . Cervical cancer Mother    History  Substance Use Topics  . Smoking status: Current Every Day Smoker -- 0.25 packs/day for 10 years    Types: Cigarettes  . Smokeless tobacco: Never Used  . Alcohol Use: No    Review of Systems  Ten point review of symptoms performed and is negative with the exception of symptoms noted above.    Allergies  Review of patient's allergies indicates no known allergies.  Home Medications   Prior to Admission medications   Medication Sig Start Date End Date Taking? Authorizing Provider   amoxicillin-clavulanate (AUGMENTIN) 875-125 MG per tablet Take 1 tablet by mouth every 12 (twelve) hours. 07/06/14  Yes Vida Roller, MD  b complex-vitamin c-folic acid (NEPHRO-VITE) 0.8 MG TABS tablet Take 1 tablet by mouth at bedtime.   Yes Historical Provider, MD  calcium acetate (PHOSLO) 667 MG capsule Take 2,001 mg by mouth 3 (three) times daily with meals.   Yes Historical Provider, MD  diphenhydrAMINE (BENADRYL) 50 MG capsule Take 50 mg by mouth every 6 (six) hours as needed for itching or allergies.   Yes Historical Provider, MD  folic acid (FOLVITE) 1 MG tablet Take 1 mg by mouth daily.   Yes Historical Provider, MD  HYDROmorphone (DILAUDID) 8 MG tablet Take 8 mg by mouth every 4 (four) hours as needed for severe pain.   Yes Historical Provider, MD  hydroxyurea (HYDREA) 500 MG capsule Take 500 mg by mouth 2 (two) times daily. May take with food to minimize GI side effects.   Yes Historical Provider, MD  lisinopril (PRINIVIL,ZESTRIL) 10 MG tablet Take 10 mg by mouth daily.   Yes Historical Provider, MD  promethazine (PHENERGAN) 25 MG tablet Take 25 mg by mouth every 6 (six) hours as needed for nausea or vomiting.   Yes Historical Provider, MD   BP 171/82  Pulse 98  Temp(Src) 100 F (37.8 C) (Oral)  Resp 20  Ht 5\' 7"  (1.702 m)  Wt 180 lb (81.647 kg)  BMI 28.19 kg/m2  SpO2 100% Physical Exam  Gen: well developed and well nourished appearing Head: NCAT Eyes: PERL, EOMI Nose: no epistaixis or rhinorrhea Mouth/throat: mucosa is moist and pink Neck: supple, no stridor Lungs: CTA B, no wheezing, rhonchi or rales CV: rapid and regular, pulse 104 bpm, no murmur, extremities appear well perfused.  Abd: soft, obese, notender, nondistended Back: no ttp, no cva ttp Skin: warm and dry Ext: graft in LUE with good thrill, normal to inspection, no dependent edema Neuro: CN ii-xii grossly intact, no focal deficits Psyche; normal affect,  calm and cooperative.   ED Course  Procedures  (including critical care time) Labs Review Labs Reviewed  COMPREHENSIVE METABOLIC PANEL - Abnormal; Notable for the following:    Sodium 131 (*)    Potassium 6.4 (*)    Chloride 93 (*)    CO2 16 (*)    BUN 96 (*)    Creatinine, Ser 14.83 (*)    Calcium 8.3 (*)    Total Protein 9.2 (*)    Albumin 3.0 (*)    Alkaline Phosphatase 142 (*)    GFR calc non Af Amer 4 (*)    GFR calc Af Amer 4 (*)    Anion gap 22 (*)    All other components within normal limits  CBC WITH DIFFERENTIAL - Abnormal; Notable for the following:    WBC 3.7 (*)    RBC 2.53 (*)    Hemoglobin 7.4 (*)    HCT 22.9 (*)    Platelets 110 (*)    All other components within normal limits  RETICULOCYTES - Abnormal; Notable for the following:    RBC. 2.53 (*)    Retic Count, Manual 12.7 (*)    All other components within normal limits  CBC WITH DIFFERENTIAL  Rosezena Sensor, ED    Imaging Review Dg Chest Port 1 View  07/08/2014   CLINICAL DATA:  Chest pain. History of hypertension, diabetes and sickle cell anemia.  EXAM: PORTABLE CHEST - 1 VIEW  COMPARISON:  07/06/2014.  FINDINGS: Cardiomegaly, vascular congestion and patchy pulmonary opacities bilaterally are similar to the prior study. There is no confluent airspace opacity or pleural effusion.  Bilateral vascular stents are again noted. Distal clavicle resorption is present bilaterally. No acute osseous findings are seen.  IMPRESSION: Compared with the examination 2 days ago, no significant changes are identified. There is persistent cardiomegaly with patchy airspace opacities most consistent with acute chest syndrome.   Electronically Signed   By: Roxy Horseman M.D.   On: 07/08/2014 01:49    EKG: nsr, no acute ischemic changes, normal intervals, normal axis, normal qrs complex  CRITICAL CARE Performed by: Brandt Loosen   Total critical care time: 11m  Critical care time was exclusive of separately billable procedures and treating other patients.  Critical  care was necessary to treat or prevent imminent or life-threatening deterioration.  Critical care was time spent personally by me on the following activities: development of treatment plan with patient and/or surrogate as well as nursing, discussions with consultants, evaluation of patient's response to treatment, examination of patient, obtaining history from patient or surrogate, ordering and performing treatments and interventions, ordering and review of laboratory studies, ordering and review of radiographic studies, pulse oximetry and re-evaluation of patient's condition.   MDM   Final diagnoses:  Sickle cell crisis  Anemia, unspecified anemia type    Patient strongly  advised to be admitted for iv antibiotics and hemodialysis. He is hyperkalemia and has unchanged infiltrates on his CXR.  He seems to acknowledge the diagnoses which I have described and the risk of worsening infectious process, worsening hyperkalemia and death secondary to either of these processes. He is competent to decline my medical advice and is insistent on leaving because, he says, he has a lot of things to do and people depending on him.  I have urged him to return to the ED should he change his mind.     Brandt LoosenJulie Leone Mobley, MD 07/08/14 [phone]

## 2014-07-09 ENCOUNTER — Emergency Department: Payer: Self-pay | Admitting: Internal Medicine

## 2014-07-09 LAB — COMPREHENSIVE METABOLIC PANEL
ALBUMIN: 2.6 g/dL — AB (ref 3.4–5.0)
ALT: 13 U/L — AB
Alkaline Phosphatase: 127 U/L — ABNORMAL HIGH
Anion Gap: 11 (ref 7–16)
BUN: 68 mg/dL — ABNORMAL HIGH (ref 7–18)
Bilirubin,Total: 0.7 mg/dL (ref 0.2–1.0)
CHLORIDE: 96 mmol/L — AB (ref 98–107)
CO2: 24 mmol/L (ref 21–32)
CREATININE: 11.98 mg/dL — AB (ref 0.60–1.30)
Calcium, Total: 8.3 mg/dL — ABNORMAL LOW (ref 8.5–10.1)
GLUCOSE: 73 mg/dL (ref 65–99)
Osmolality: 281 (ref 275–301)
Potassium: 5 mmol/L (ref 3.5–5.1)
SGOT(AST): 34 U/L (ref 15–37)
Sodium: 131 mmol/L — ABNORMAL LOW (ref 136–145)
Total Protein: 9.5 g/dL — ABNORMAL HIGH (ref 6.4–8.2)

## 2014-07-09 LAB — CBC WITH DIFFERENTIAL/PLATELET
BASOS ABS: 0 10*3/uL (ref 0.0–0.1)
Basophil %: 0.9 %
EOS ABS: 0 10*3/uL (ref 0.0–0.7)
EOS PCT: 0.2 %
HCT: 23 % — AB (ref 40.0–52.0)
HGB: 7.5 g/dL — ABNORMAL LOW (ref 13.0–18.0)
LYMPHS ABS: 0.7 10*3/uL — AB (ref 1.0–3.6)
Lymphocyte %: 23.1 %
MCH: 30.1 pg (ref 26.0–34.0)
MCHC: 32.5 g/dL (ref 32.0–36.0)
MCV: 93 fL (ref 80–100)
MONO ABS: 0.3 x10 3/mm (ref 0.2–1.0)
Monocyte %: 10.9 %
Neutrophil #: 2 10*3/uL (ref 1.4–6.5)
Neutrophil %: 64.9 %
Platelet: 94 10*3/uL — ABNORMAL LOW (ref 150–440)
RBC: 2.48 10*6/uL — ABNORMAL LOW (ref 4.40–5.90)
RDW: 15 % — ABNORMAL HIGH (ref 11.5–14.5)
WBC: 3 10*3/uL — ABNORMAL LOW (ref 3.8–10.6)

## 2014-07-09 LAB — RETICULOCYTES
ABSOLUTE RETIC COUNT: 0.0179 10*6/uL — AB (ref 0.019–0.186)
Reticulocyte: 0.72 % (ref 0.4–3.1)

## 2014-07-11 ENCOUNTER — Encounter (HOSPITAL_COMMUNITY): Payer: Self-pay | Admitting: Emergency Medicine

## 2014-07-11 ENCOUNTER — Emergency Department (HOSPITAL_COMMUNITY): Payer: Medicare Other

## 2014-07-11 ENCOUNTER — Emergency Department (HOSPITAL_COMMUNITY)
Admission: EM | Admit: 2014-07-11 | Discharge: 2014-07-11 | Disposition: A | Discharge status: Home / Self Care | Payer: Medicare Other | Attending: Emergency Medicine | Admitting: Emergency Medicine

## 2014-07-11 DIAGNOSIS — F17200 Nicotine dependence, unspecified, uncomplicated: Secondary | ICD-10-CM | POA: Insufficient documentation

## 2014-07-11 DIAGNOSIS — E119 Type 2 diabetes mellitus without complications: Secondary | ICD-10-CM | POA: Insufficient documentation

## 2014-07-11 DIAGNOSIS — I129 Hypertensive chronic kidney disease with stage 1 through stage 4 chronic kidney disease, or unspecified chronic kidney disease: Secondary | ICD-10-CM | POA: Diagnosis not present

## 2014-07-11 DIAGNOSIS — Z79899 Other long term (current) drug therapy: Secondary | ICD-10-CM | POA: Insufficient documentation

## 2014-07-11 DIAGNOSIS — N185 Chronic kidney disease, stage 5: Secondary | ICD-10-CM | POA: Diagnosis not present

## 2014-07-11 DIAGNOSIS — Z21 Asymptomatic human immunodeficiency virus [HIV] infection status: Secondary | ICD-10-CM | POA: Diagnosis not present

## 2014-07-11 DIAGNOSIS — M549 Dorsalgia, unspecified: Secondary | ICD-10-CM | POA: Insufficient documentation

## 2014-07-11 DIAGNOSIS — IMO0001 Reserved for inherently not codable concepts without codable children: Secondary | ICD-10-CM | POA: Insufficient documentation

## 2014-07-11 DIAGNOSIS — D571 Sickle-cell disease without crisis: Secondary | ICD-10-CM | POA: Diagnosis not present

## 2014-07-11 LAB — CBC WITH DIFFERENTIAL/PLATELET
BASOS PCT: 0 % (ref 0–1)
Basophils Absolute: 0 10*3/uL (ref 0.0–0.1)
EOS ABS: 0 10*3/uL (ref 0.0–0.7)
Eosinophils Relative: 2 % (ref 0–5)
HCT: 23.3 % — ABNORMAL LOW (ref 39.0–52.0)
Hemoglobin: 7.3 g/dL — ABNORMAL LOW (ref 13.0–17.0)
Lymphocytes Relative: 22 % (ref 12–46)
Lymphs Abs: 0.5 10*3/uL — ABNORMAL LOW (ref 0.7–4.0)
MCH: 28.6 pg (ref 26.0–34.0)
MCHC: 31.3 g/dL (ref 30.0–36.0)
MCV: 91.4 fL (ref 78.0–100.0)
Monocytes Absolute: 0.3 10*3/uL (ref 0.1–1.0)
Monocytes Relative: 10 % (ref 3–12)
Neutro Abs: 1.6 10*3/uL — ABNORMAL LOW (ref 1.7–7.7)
Neutrophils Relative %: 65 % (ref 43–77)
PLATELETS: 120 10*3/uL — AB (ref 150–400)
RBC: 2.55 MIL/uL — ABNORMAL LOW (ref 4.22–5.81)
RDW: 14.6 % (ref 11.5–15.5)
WBC: 2.4 10*3/uL — ABNORMAL LOW (ref 4.0–10.5)

## 2014-07-11 LAB — COMPREHENSIVE METABOLIC PANEL
ALBUMIN: 2.7 g/dL — AB (ref 3.5–5.2)
ALK PHOS: 131 U/L — AB (ref 39–117)
ALT: 10 U/L (ref 0–53)
ANION GAP: 16 — AB (ref 5–15)
AST: 23 U/L (ref 0–37)
BUN: 35 mg/dL — AB (ref 6–23)
CALCIUM: 8.3 mg/dL — AB (ref 8.4–10.5)
CO2: 28 mEq/L (ref 19–32)
Chloride: 93 mEq/L — ABNORMAL LOW (ref 96–112)
Creatinine, Ser: 6.9 mg/dL — ABNORMAL HIGH (ref 0.50–1.35)
GFR calc Af Amer: 11 mL/min — ABNORMAL LOW (ref >90)
GFR calc non Af Amer: 9 mL/min — ABNORMAL LOW (ref >90)
Glucose, Bld: 96 mg/dL (ref 70–99)
Potassium: 4.1 mEq/L (ref 3.7–5.3)
SODIUM: 137 meq/L (ref 137–147)
TOTAL PROTEIN: 9.1 g/dL — AB (ref 6.0–8.3)
Total Bilirubin: 0.5 mg/dL (ref 0.3–1.2)

## 2014-07-11 LAB — RETICULOCYTES
RBC.: 2.55 MIL/uL — AB (ref 4.22–5.81)
RETIC COUNT ABSOLUTE: 20.4 10*3/uL (ref 19.0–186.0)
Retic Ct Pct: 0.8 % (ref 0.4–3.1)

## 2014-07-11 MED ORDER — DEXTROSE 5 % IV SOLN: 1.0000 g | Freq: Once | INTRAVENOUS | Status: DC

## 2014-07-11 MED ORDER — PROMETHAZINE HCL 25 MG/ML IJ SOLN
25.0000 mg | Freq: Once | INTRAMUSCULAR | Status: AC
  Administered 2014-07-11: 25 mg via INTRAVENOUS
  Filled 2014-07-11: qty 1

## 2014-07-11 MED ORDER — HYDROMORPHONE HCL PF 1 MG/ML IJ SOLN
1.0000 mg | Freq: Once | INTRAMUSCULAR | Status: AC
  Administered 2014-07-11: 1 mg via INTRAVENOUS
  Filled 2014-07-11: qty 1

## 2014-07-11 MED ORDER — HYDROMORPHONE HCL PF 1 MG/ML IJ SOLN
2.0000 mg | Freq: Once | INTRAMUSCULAR | Status: AC
  Administered 2014-07-11: 2 mg via INTRAVENOUS
  Filled 2014-07-11: qty 2

## 2014-07-11 MED ORDER — DIPHENHYDRAMINE HCL 50 MG/ML IJ SOLN: 25.0000 mg | Freq: Once | INTRAMUSCULAR | Status: DC

## 2014-07-11 MED ORDER — ONDANSETRON HCL 4 MG/2ML IJ SOLN
4.0000 mg | Freq: Once | INTRAMUSCULAR | Status: AC
  Administered 2014-07-11: 4 mg via INTRAVENOUS
  Filled 2014-07-11: qty 2

## 2014-07-11 MED ORDER — ONDANSETRON HCL 4 MG/2ML IJ SOLN: 4.0000 mg | Freq: Once | INTRAMUSCULAR | Status: DC

## 2014-07-11 MED ORDER — DIPHENHYDRAMINE HCL 50 MG/ML IJ SOLN
12.5000 mg | Freq: Once | INTRAMUSCULAR | Status: AC
  Administered 2014-07-11: 12.5 mg via INTRAVENOUS
  Filled 2014-07-11: qty 1

## 2014-07-11 MED ORDER — AZITHROMYCIN 250 MG PO TABS: 500.0000 mg | ORAL_TABLET | Freq: Once | ORAL | Status: DC

## 2014-07-11 NOTE — ED Notes (Signed)
Pt. reports generalized joint pains / back pain for several days with nausea and vomitting .

## 2014-07-11 NOTE — Discharge Instructions (Signed)
Please return to the ER if your symptoms worsen; you have increased pain, fevers, chills, inability to keep any medications down, confusion. Otherwise see the outpatient doctor as requested.   Sickle Cell Anemia, Adult Sickle cell anemia is a condition in which red blood cells have an abnormal "sickle" shape. This abnormal shape shortens the cells' life span, which results in a lower than normal concentration of red blood cells in the blood. The sickle shape also causes the cells to clump together and block free blood flow through the blood vessels. As a result, the tissues and organs of the body do not receive enough oxygen. Sickle cell anemia causes organ damage and pain and increases the risk of infection. CAUSES  Sickle cell anemia is a genetic disorder. Those who receive two copies of the gene have the condition, and those who receive one copy have the trait. RISK FACTORS The sickle cell gene is most common in people whose families originated in [place]. Other areas of the globe where sickle cell trait occurs include the [place], [place], the [place], and the [place].  SIGNS AND SYMPTOMS  Pain, especially in the extremities, back, chest, or abdomen (common). The pain may start suddenly or may develop following an illness, especially if there is dehydration. Pain can also occur due to overexertion or exposure to extreme temperature changes.  Frequent severe bacterial infections, especially certain types of pneumonia and meningitis.  Pain and swelling in the hands and feet.  Decreased activity.   Loss of appetite.   Change in behavior.  Headaches.  Seizures.  Shortness of breath or difficulty breathing.  Vision changes.  Skin ulcers. Those with the trait may not have symptoms or they may have mild symptoms.  DIAGNOSIS  Sickle cell anemia is diagnosed with blood tests that demonstrate the genetic trait. It is often diagnosed during the newborn  period, due to mandatory testing nationwide. A variety of blood tests, X-rays, CT scans, MRI scans, ultrasounds, and lung function tests may also be done to monitor the condition. TREATMENT  Sickle cell anemia may be treated with:  Medicines. You may be given pain medicines, antibiotic medicines (to treat and prevent infections) or medicines to increase the production of certain types of hemoglobin.  Fluids.  Oxygen.  Blood transfusions. HOME CARE INSTRUCTIONS   Drink enough fluid to keep your urine clear or pale yellow. Increase your fluid intake in hot weather and during exercise.  Do not smoke. Smoking lowers oxygen levels in the blood.   Only take over-the-counter or prescription medicines for pain, fever, or discomfort as directed by your health care provider.  Take antibiotics as directed by your health care provider. Make sure you finish them it even if you start to feel better.   Take supplements as directed by your health care provider.   Consider wearing a medical alert bracelet. This tells anyone caring for you in an emergency of your condition.   When traveling, keep your medical information, health care provider's names, and the medicines you take with you at all times.   If you develop a fever, do not take medicines to reduce the fever right away. This could cover up a problem that is developing. Notify your health care provider.  Keep all follow-up appointments with your health care provider. Sickle cell anemia requires regular medical care. SEEK MEDICAL CARE IF: You have a fever. SEEK IMMEDIATE MEDICAL CARE IF:   You feel dizzy or faint.   You have new  abdominal pain, especially on the left side near the stomach area.   You develop a persistent, often uncomfortable and painful penile erection (priapism). If this is not treated immediately it will lead to impotence.   You have numbness your arms or legs or you have a hard time moving them.   You have  a hard time with speech.   You have a fever or persistent symptoms for more than 2-3 days.   You have a fever and your symptoms suddenly get worse.   You have signs or symptoms of infection. These include:   Chills.   Abnormal tiredness (lethargy).   Irritability.   Poor eating.   Vomiting.   You develop pain that is not helped with medicine.   You develop shortness of breath.  You have pain in your chest.   You are coughing up pus-like or bloody sputum.   You develop a stiff neck.  Your feet or hands swell or have pain.  Your abdomen appears bloated.  You develop joint pain. MAKE SURE YOU:  Understand these instructions. Document Released: 03/15/2006 Document Revised: 04/21/2014 Document Reviewed: 07/17/2013 [facility] Patient Information 2015 [place], [place]. This information is not intended to replace advice given to you by your health care provider. Make sure you discuss any questions you have with your health care provider.

## 2014-07-11 NOTE — ED Notes (Signed)
Patient refused prescribed meds and did not want to take his discharge papers.

## 2014-07-12 LAB — CULTURE, BLOOD (ROUTINE X 2): Culture: NO GROWTH

## 2014-07-13 ENCOUNTER — Emergency Department: Payer: Self-pay | Admitting: Emergency Medicine

## 2014-07-13 LAB — TROPONIN I

## 2014-07-13 LAB — COMPREHENSIVE METABOLIC PANEL
ANION GAP: 10 (ref 7–16)
Albumin: 2.6 g/dL — ABNORMAL LOW (ref 3.4–5.0)
Alkaline Phosphatase: 117 U/L — ABNORMAL HIGH
BUN: 22 mg/dL — AB (ref 7–18)
Bilirubin,Total: 0.5 mg/dL (ref 0.2–1.0)
CALCIUM: 8.3 mg/dL — AB (ref 8.5–10.1)
CHLORIDE: 96 mmol/L — AB (ref 98–107)
CREATININE: 6.25 mg/dL — AB (ref 0.60–1.30)
Co2: 31 mmol/L (ref 21–32)
EGFR (Non-African Amer.): 11 — ABNORMAL LOW
GFR CALC AF AMER: 12 — AB
GLUCOSE: 102 mg/dL — AB (ref 65–99)
OSMOLALITY: 277 (ref 275–301)
POTASSIUM: 3.3 mmol/L — AB (ref 3.5–5.1)
SGOT(AST): 20 U/L (ref 15–37)
SGPT (ALT): 14 U/L
SODIUM: 137 mmol/L (ref 136–145)
TOTAL PROTEIN: 9.1 g/dL — AB (ref 6.4–8.2)

## 2014-07-13 LAB — LIPASE, BLOOD: Lipase: 521 U/L — ABNORMAL HIGH (ref 73–393)

## 2014-07-13 LAB — TSH: Thyroid Stimulating Horm: 2.52 u[IU]/mL

## 2014-07-13 LAB — CBC WITH DIFFERENTIAL/PLATELET
BASOS ABS: 0 10*3/uL (ref 0.0–0.1)
Basophil %: 0.7 %
Eosinophil #: 0.1 10*3/uL (ref 0.0–0.7)
Eosinophil %: 3 %
HCT: 24.2 % — ABNORMAL LOW (ref 40.0–52.0)
HGB: 7.7 g/dL — ABNORMAL LOW (ref 13.0–18.0)
LYMPHS ABS: 0.4 10*3/uL — AB (ref 1.0–3.6)
Lymphocyte %: 21.3 %
MCH: 29.8 pg (ref 26.0–34.0)
MCHC: 31.9 g/dL — ABNORMAL LOW (ref 32.0–36.0)
MCV: 93 fL (ref 80–100)
Monocyte #: 0.3 x10 3/mm (ref 0.2–1.0)
Monocyte %: 12.5 %
Neutrophil #: 1.3 10*3/uL — ABNORMAL LOW (ref 1.4–6.5)
Neutrophil %: 62.5 %
Platelet: 141 10*3/uL — ABNORMAL LOW (ref 150–440)
RBC: 2.59 10*6/uL — AB (ref 4.40–5.90)
RDW: 14.9 % — ABNORMAL HIGH (ref 11.5–14.5)
WBC: 2 10*3/uL — CL (ref 3.8–10.6)

## 2014-07-13 LAB — RETICULOCYTES
ABSOLUTE RETIC COUNT: 0.0256 10*6/uL (ref 0.019–0.186)
RETICULOCYTE: 0.99 % (ref 0.4–3.1)

## 2014-07-14 NOTE — ED Provider Notes (Signed)
CSN: [id number]     Arrival date & time 07/11/14  0044 History   First MD Initiated Contact with Patient 07/11/14 0222     Chief Complaint  Patient presents with  . Joint Pain  . Back Pain     (Consider location/radiation/quality/duration/timing/severity/associated sxs/prior Treatment) HPI Comments: Pt with hx of ESRD on HD and sickle cell anemia comes in with cc of n/v/pain. Pt also has hx of DM. He was diagnosed with acute chest just recently, and left AMA. Reports that although although his fevers are better, and he has no chest pain, or dib currently, he is having generalized body aches and nausea and emesis. All of his joints hurt. He has no cough, headaches, confusion. Pt states that he has gone to dialysis since he left the ER. Pt denies any chills. Pt DOESN'T WANT TO STAY IN THE ER, no matter what, as he has to work and cant afford to take time off.  Patient is a 33 y.o. male presenting with back pain. The history is provided by the patient.  Back Pain Associated symptoms: no chest pain, no dysuria, no fever and no headaches     Past Medical History  Diagnosis Date  . Hypertension   . Diabetes mellitus   . Sickle cell anemia   . Renal failure   . Renal failure   . HIV (human immunodeficiency virus infection)   . Blood transfusion    Past Surgical History  Procedure Laterality Date  . Hip surgery      "screw placed into joint"  . Av fistula placement     Family History  Problem Relation Age of Onset  . Cervical cancer Mother    History  Substance Use Topics  . Smoking status: Current Every Day Smoker -- 0.25 packs/day for 10 years    Types: Cigarettes  . Smokeless tobacco: Never Used  . Alcohol Use: No    Review of Systems  Constitutional: Positive for fatigue. Negative for fever and chills.  Eyes: Negative for visual disturbance.  Respiratory: Negative for cough, chest tightness and shortness of breath.   Cardiovascular: Negative for chest pain.   Gastrointestinal: Negative for abdominal distention.  Genitourinary: Negative for dysuria, enuresis and difficulty urinating.  Musculoskeletal: Positive for arthralgias, back pain and myalgias. Negative for neck pain.  Skin: Negative for rash.  Neurological: Negative for dizziness, light-headedness and headaches.  Psychiatric/Behavioral: Negative for confusion.      Allergies  Review of patient's allergies indicates no known allergies.  Home Medications   Prior to Admission medications   Medication Sig Start Date End Date Taking? Authorizing Provider  amoxicillin-clavulanate (AUGMENTIN) 875-125 MG per tablet Take 1 tablet by mouth every 12 (twelve) hours. 07/06/14  Yes Vida RollerBrian D Miller, MD  b complex-vitamin c-folic acid (NEPHRO-VITE) 0.8 MG TABS tablet Take 1 tablet by mouth at bedtime.   Yes Historical Provider, MD  calcium acetate (PHOSLO) 667 MG capsule Take 2,001 mg by mouth 3 (three) times daily with meals.   Yes Historical Provider, MD  diphenhydrAMINE (BENADRYL) 50 MG capsule Take 50 mg by mouth every 6 (six) hours as needed for itching or allergies.   Yes Historical Provider, MD  folic acid (FOLVITE) 1 MG tablet Take 1 mg by mouth daily.   Yes Historical Provider, MD  HYDROmorphone (DILAUDID) 8 MG tablet Take 8 mg by mouth every 4 (four) hours as needed for severe pain.   Yes Historical Provider, MD  hydroxyurea (HYDREA) 500 MG capsule Take 500 mg  by mouth 2 (two) times daily. May take with food to minimize GI side effects.   Yes Historical Provider, MD  lisinopril (PRINIVIL,ZESTRIL) 10 MG tablet Take 10 mg by mouth daily.   Yes Historical Provider, MD  promethazine (PHENERGAN) 25 MG tablet Take 25 mg by mouth every 6 (six) hours as needed for nausea or vomiting.   Yes Historical Provider, MD   BP 140/84  Pulse 84  Temp(Src) 99.5 F (37.5 C) (Oral)  Resp 20  SpO2 100% Physical Exam  Nursing note and vitals reviewed. Constitutional: He is oriented to person, place, and time.  He appears well-developed.  HENT:  Head: Normocephalic and atraumatic.  Eyes: Conjunctivae and EOM are normal. Pupils are equal, round, and reactive to light.  Neck: Normal range of motion. Neck supple. No JVD present.  Cardiovascular: Normal rate and regular rhythm.   Pulmonary/Chest: Effort normal and breath sounds normal. No respiratory distress. He has no wheezes.  Abdominal: Soft. Bowel sounds are normal. There is no tenderness.  Neurological: He is alert and oriented to person, place, and time.  Skin: Skin is warm. No rash noted.    ED Course  Procedures (including critical care time) Labs Review Labs Reviewed  CBC WITH DIFFERENTIAL - Abnormal; Notable for the following:    WBC 2.4 (*)    RBC 2.55 (*)    Hemoglobin 7.3 (*)    HCT 23.3 (*)    Platelets 120 (*)    Neutro Abs 1.6 (*)    Lymphs Abs 0.5 (*)    All other components within normal limits  COMPREHENSIVE METABOLIC PANEL - Abnormal; Notable for the following:    Chloride 93 (*)    BUN 35 (*)    Creatinine, Ser 6.90 (*)    Calcium 8.3 (*)    Total Protein 9.1 (*)    Albumin 2.7 (*)    Alkaline Phosphatase 131 (*)    GFR calc non Af Amer 9 (*)    GFR calc Af Amer 11 (*)    Anion gap 16 (*)    All other components within normal limits  RETICULOCYTES - Abnormal; Notable for the following:    RBC. 2.55 (*)    All other components within normal limits    Imaging Review No results found.   EKG Interpretation None      MDM   Final diagnoses:  Sickle cell anemia without crisis  CKD (chronic kidney disease), stage 5    Pt comes in for sickle cell pain. Pain is all over. He has other significant comorbidities as well - ESRD, DM, HIV. His retic count is normal - and honestly, i am not convinced that he has sickle cell anemia. He is a little vague about his sickle cell history to me.  [name] CXR is better compared to before. He was offered admission before all the results, as i saw that he had come in with  hypoxia and had infiltrate last time, and he refused - and he doesn't want admission this time either. He was not hypoxic on room air when i saw him.  Pan improved, and patient was discharged.   Derwood Kaplan, MD 07/14/14 [phone]

## 2014-07-17 ENCOUNTER — Emergency Department: Payer: Self-pay | Admitting: Emergency Medicine

## 2014-07-17 LAB — CBC WITH DIFFERENTIAL/PLATELET
BASOS ABS: 0 10*3/uL (ref 0.0–0.1)
Basophil %: 0.7 %
Eosinophil #: 0.1 10*3/uL (ref 0.0–0.7)
Eosinophil %: 2.4 %
HCT: 24 % — ABNORMAL LOW (ref 40.0–52.0)
HGB: 7.6 g/dL — ABNORMAL LOW (ref 13.0–18.0)
LYMPHS ABS: 0.5 10*3/uL — AB (ref 1.0–3.6)
Lymphocyte %: 21 %
MCH: 30.1 pg (ref 26.0–34.0)
MCHC: 31.8 g/dL — ABNORMAL LOW (ref 32.0–36.0)
MCV: 95 fL (ref 80–100)
MONOS PCT: 13.2 %
Monocyte #: 0.3 x10 3/mm (ref 0.2–1.0)
Neutrophil #: 1.5 10*3/uL (ref 1.4–6.5)
Neutrophil %: 62.7 %
PLATELETS: 175 10*3/uL (ref 150–440)
RBC: 2.54 10*6/uL — ABNORMAL LOW (ref 4.40–5.90)
RDW: 15.6 % — ABNORMAL HIGH (ref 11.5–14.5)
WBC: 2.3 10*3/uL — AB (ref 3.8–10.6)

## 2014-07-17 LAB — COMPREHENSIVE METABOLIC PANEL
ALBUMIN: 2.6 g/dL — AB (ref 3.4–5.0)
ALK PHOS: 143 U/L — AB
ANION GAP: 13 (ref 7–16)
AST: 22 U/L (ref 15–37)
BUN: 53 mg/dL — AB (ref 7–18)
Bilirubin,Total: 0.5 mg/dL (ref 0.2–1.0)
CALCIUM: 8.2 mg/dL — AB (ref 8.5–10.1)
CHLORIDE: 99 mmol/L (ref 98–107)
CREATININE: 12.72 mg/dL — AB (ref 0.60–1.30)
Co2: 25 mmol/L (ref 21–32)
EGFR (African American): 5 — ABNORMAL LOW
EGFR (Non-African Amer.): 5 — ABNORMAL LOW
Glucose: 110 mg/dL — ABNORMAL HIGH (ref 65–99)
OSMOLALITY: 289 (ref 275–301)
Potassium: 4.3 mmol/L (ref 3.5–5.1)
SGPT (ALT): 11 U/L — ABNORMAL LOW
Sodium: 137 mmol/L (ref 136–145)
TOTAL PROTEIN: 9.1 g/dL — AB (ref 6.4–8.2)

## 2014-07-17 LAB — RETICULOCYTES
Absolute Retic Count: 0.0694 10*6/uL (ref 0.019–0.186)
Reticulocyte: 2.73 % (ref 0.4–3.1)

## 2014-07-18 ENCOUNTER — Emergency Department (HOSPITAL_COMMUNITY)
Admission: EM | Admit: 2014-07-18 | Discharge: 2014-07-18 | Disposition: A | Discharge status: Home / Self Care | Payer: Medicare Other | Attending: Emergency Medicine | Admitting: Emergency Medicine

## 2014-07-18 ENCOUNTER — Encounter (HOSPITAL_COMMUNITY): Payer: Self-pay | Admitting: Emergency Medicine

## 2014-07-18 DIAGNOSIS — F17200 Nicotine dependence, unspecified, uncomplicated: Secondary | ICD-10-CM | POA: Diagnosis not present

## 2014-07-18 DIAGNOSIS — R112 Nausea with vomiting, unspecified: Secondary | ICD-10-CM | POA: Diagnosis not present

## 2014-07-18 DIAGNOSIS — E119 Type 2 diabetes mellitus without complications: Secondary | ICD-10-CM | POA: Diagnosis not present

## 2014-07-18 DIAGNOSIS — M545 Low back pain, unspecified: Secondary | ICD-10-CM | POA: Insufficient documentation

## 2014-07-18 DIAGNOSIS — D5700 Hb-SS disease with crisis, unspecified: Secondary | ICD-10-CM | POA: Insufficient documentation

## 2014-07-18 DIAGNOSIS — Z992 Dependence on renal dialysis: Secondary | ICD-10-CM | POA: Insufficient documentation

## 2014-07-18 DIAGNOSIS — N186 End stage renal disease: Secondary | ICD-10-CM | POA: Diagnosis not present

## 2014-07-18 DIAGNOSIS — Z21 Asymptomatic human immunodeficiency virus [HIV] infection status: Secondary | ICD-10-CM | POA: Diagnosis not present

## 2014-07-18 DIAGNOSIS — I120 Hypertensive chronic kidney disease with stage 5 chronic kidney disease or end stage renal disease: Secondary | ICD-10-CM | POA: Insufficient documentation

## 2014-07-18 DIAGNOSIS — Z79899 Other long term (current) drug therapy: Secondary | ICD-10-CM | POA: Insufficient documentation

## 2014-07-18 LAB — CBC WITH DIFFERENTIAL/PLATELET
BASOS ABS: 0 10*3/uL (ref 0.0–0.1)
Basophils Relative: 0 % (ref 0–1)
Eosinophils Absolute: 0.1 10*3/uL (ref 0.0–0.7)
Eosinophils Relative: 2 % (ref 0–5)
HEMATOCRIT: 25.1 % — AB (ref 39.0–52.0)
Hemoglobin: 7.7 g/dL — ABNORMAL LOW (ref 13.0–17.0)
Lymphocytes Relative: 21 % (ref 12–46)
Lymphs Abs: 0.5 10*3/uL — ABNORMAL LOW (ref 0.7–4.0)
MCH: 29.7 pg (ref 26.0–34.0)
MCHC: 30.7 g/dL (ref 30.0–36.0)
MCV: 96.9 fL (ref 78.0–100.0)
MONOS PCT: 16 % — AB (ref 3–12)
Monocytes Absolute: 0.4 10*3/uL (ref 0.1–1.0)
NEUTROS ABS: 1.6 10*3/uL — AB (ref 1.7–7.7)
Neutrophils Relative %: 61 % (ref 43–77)
Platelets: 227 10*3/uL (ref 150–400)
RBC: 2.59 MIL/uL — ABNORMAL LOW (ref 4.22–5.81)
RDW: 18.4 % — ABNORMAL HIGH (ref 11.5–15.5)
WBC: 2.6 10*3/uL — AB (ref 4.0–10.5)

## 2014-07-18 LAB — COMPREHENSIVE METABOLIC PANEL
ALK PHOS: 157 U/L — AB (ref 39–117)
ALT: 7 U/L (ref 0–53)
ANION GAP: 15 (ref 5–15)
AST: 18 U/L (ref 0–37)
Albumin: 2.9 g/dL — ABNORMAL LOW (ref 3.5–5.2)
BILIRUBIN TOTAL: 0.4 mg/dL (ref 0.3–1.2)
BUN: 27 mg/dL — AB (ref 6–23)
CHLORIDE: 93 meq/L — AB (ref 96–112)
CO2: 27 mEq/L (ref 19–32)
Calcium: 9.1 mg/dL (ref 8.4–10.5)
Creatinine, Ser: 6.49 mg/dL — ABNORMAL HIGH (ref 0.50–1.35)
GFR calc non Af Amer: 10 mL/min — ABNORMAL LOW (ref >90)
GFR, EST AFRICAN AMERICAN: 12 mL/min — AB (ref >90)
GLUCOSE: 100 mg/dL — AB (ref 70–99)
Potassium: 4.3 mEq/L (ref 3.7–5.3)
Sodium: 135 mEq/L — ABNORMAL LOW (ref 137–147)
TOTAL PROTEIN: 9.2 g/dL — AB (ref 6.0–8.3)

## 2014-07-18 LAB — RETICULOCYTES
RBC.: 2.59 MIL/uL — AB (ref 4.22–5.81)
Retic Count, Absolute: 150.2 10*3/uL (ref 19.0–186.0)
Retic Ct Pct: 5.8 % — ABNORMAL HIGH (ref 0.4–3.1)

## 2014-07-18 LAB — LIPASE, BLOOD: Lipase: 146 U/L — ABNORMAL HIGH (ref 11–59)

## 2014-07-18 MED ORDER — HYDROMORPHONE HCL PF 2 MG/ML IJ SOLN
2.0000 mg | Freq: Once | INTRAMUSCULAR | Status: AC
  Administered 2014-07-18: 2 mg via INTRAVENOUS
  Filled 2014-07-18: qty 1

## 2014-07-18 MED ORDER — DIPHENHYDRAMINE HCL 50 MG/ML IJ SOLN
25.0000 mg | Freq: Once | INTRAMUSCULAR | Status: AC
  Administered 2014-07-18: 25 mg via INTRAVENOUS
  Filled 2014-07-18: qty 1

## 2014-07-18 MED ORDER — ONDANSETRON HCL 4 MG/2ML IJ SOLN
4.0000 mg | Freq: Once | INTRAMUSCULAR | Status: AC
  Administered 2014-07-18: 4 mg via INTRAVENOUS
  Filled 2014-07-18: qty 2

## 2014-07-18 MED ORDER — ONDANSETRON HCL 4 MG/2ML IJ SOLN
4.0000 mg | INTRAMUSCULAR | Status: AC
  Administered 2014-07-18: 4 mg via INTRAVENOUS
  Filled 2014-07-18: qty 2

## 2014-07-18 MED ORDER — SODIUM CHLORIDE 0.9 % IV BOLUS (SEPSIS)
1000.0000 mL | Freq: Once | INTRAVENOUS | Status: AC
  Administered 2014-07-18: 1000 mL via INTRAVENOUS

## 2014-07-18 NOTE — Discharge Instructions (Signed)
Sickle Cell Anemia, Adult °Sickle cell anemia is a condition in which red blood cells have an abnormal "sickle" shape. This abnormal shape shortens the cells' life span, which results in a lower than normal concentration of red blood cells in the blood. The sickle shape also causes the cells to clump together and block free blood flow through the blood vessels. As a result, the tissues and organs of the body do not receive enough oxygen. Sickle cell anemia causes organ damage and pain and increases the risk of infection. °CAUSES  °Sickle cell anemia is a genetic disorder. Those who receive two copies of the gene have the condition, and those who receive one copy have the trait. °RISK FACTORS °The sickle cell gene is most common in people whose families originated in [place]. Other areas of the globe where sickle cell trait occurs include the Mediterranean, [place], the [place], and the [place].  °SIGNS AND SYMPTOMS °· Pain, especially in the extremities, back, chest, or abdomen (common). The pain may start suddenly or may develop following an illness, especially if there is dehydration. Pain can also occur due to overexertion or exposure to extreme temperature changes. °· Frequent severe bacterial infections, especially certain types of pneumonia and meningitis. °· Pain and swelling in the hands and feet. °· Decreased activity.   °· Loss of appetite.   °· Change in behavior. °· Headaches. °· Seizures. °· Shortness of breath or difficulty breathing. °· Vision changes. °· Skin ulcers. °Those with the trait may not have symptoms or they may have mild symptoms.  °DIAGNOSIS  °Sickle cell anemia is diagnosed with blood tests that demonstrate the genetic trait. It is often diagnosed during the newborn period, due to mandatory testing nationwide. A variety of blood tests, X-rays, CT scans, MRI scans, ultrasounds, and lung function tests may also be done to monitor the condition. °TREATMENT  °Sickle  cell anemia may be treated with: °· Medicines. You may be given pain medicines, antibiotic medicines (to treat and prevent infections) or medicines to increase the production of certain types of hemoglobin. °· Fluids. °· Oxygen. °· Blood transfusions. °HOME CARE INSTRUCTIONS  °· Drink enough fluid to keep your urine clear or pale yellow. Increase your fluid intake in hot weather and during exercise. °· Do not smoke. Smoking lowers oxygen levels in the blood.   °· Only take over-the-counter or prescription medicines for pain, fever, or discomfort as directed by your health care provider. °· Take antibiotics as directed by your health care provider. Make sure you finish them it even if you start to feel better.   °· Take supplements as directed by your health care provider.   °· Consider wearing a medical alert bracelet. This tells anyone caring for you in an emergency of your condition.   °· When traveling, keep your medical information, health care provider's names, and the medicines you take with you at all times.   °· If you develop a fever, do not take medicines to reduce the fever right away. This could cover up a problem that is developing. Notify your health care provider. °· Keep all follow-up appointments with your health care provider. Sickle cell anemia requires regular medical care. °SEEK MEDICAL CARE IF: ° You have a fever. °SEEK IMMEDIATE MEDICAL CARE IF:  °· You feel dizzy or faint.   °· You have new abdominal pain, especially on the left side near the stomach area.   °· You develop a persistent, often uncomfortable and painful penile erection (priapism). If this is not treated immediately it   will lead to impotence.   °· You have numbness your arms or legs or you have a hard time moving them.   °· You have a hard time with speech.   °· You have a fever or persistent symptoms for more than 2-3 days.   °· You have a fever and your symptoms suddenly get worse.   °· You have signs or symptoms of infection.  These include:   °¨ Chills.   °¨ Abnormal tiredness (lethargy).   °¨ Irritability.   °¨ Poor eating.   °¨ Vomiting.   °· You develop pain that is not helped with medicine.   °· You develop shortness of breath. °· You have pain in your chest.   °· You are coughing up pus-like or bloody sputum.   °· You develop a stiff neck. °· Your feet or hands swell or have pain. °· Your abdomen appears bloated. °· You develop joint pain. °MAKE SURE YOU: °· Understand these instructions. °Document Released: 03/15/2006 Document Revised: 04/21/2014 Document Reviewed: 07/17/2013 °[employer]® Patient Information ©2015 [employer]. This information is not intended to replace advice given to you by your health care provider. Make sure you discuss any questions you have with your health care provider. ° °

## 2014-07-18 NOTE — ED Notes (Signed)
Pt is c/o pain in all of his joints and in his back  Pt states he has sickle cell and this is consistant with what he normally feels like

## 2014-07-18 NOTE — ED Provider Notes (Signed)
CSN: [id number]     Arrival date & time 07/18/14  0041 History   First MD Initiated Contact with Patient 07/18/14 0303     Chief Complaint  Patient presents with  . Sickle Cell Pain Crisis     (Consider location/radiation/quality/duration/timing/severity/associated sxs/prior Treatment) HPI Comments: Patient is a 33 y/o male with a hx of HTN, DM, renal failure on T/Th/Sa dialysis, HIV, and sickle cell  anemia. He presents to the ED today for low back pain, lower extremity joint pain and myalgias, and nausea with vomiting. Patient states that symptoms began yesterday and have been constant. He states he has not been able to manage his crisis at home because his vomiting has prevented him from taking any of his medications. Patient states that pain feels similar to past sickle cell crisis pain. He denies any modifying factors of his symptoms. He denies associated fever, CP, SOB, abdominal pain, urinary symptoms, bowel/bladder incontinence, numbness, and weakness.  Last CD4 count >500, per patient  The history is provided by the patient. No language interpreter was used.    Past Medical History  Diagnosis Date  . Hypertension   . Diabetes mellitus   . Sickle cell anemia   . Renal failure   . Renal failure   . HIV (human immunodeficiency virus infection)   . Blood transfusion    Past Surgical History  Procedure Laterality Date  . Hip surgery      "screw placed into joint"  . Av fistula placement     Family History  Problem Relation Age of Onset  . Cervical cancer Mother    History  Substance Use Topics  . Smoking status: Current Every Day Smoker -- 0.25 packs/day for 10 years    Types: Cigarettes  . Smokeless tobacco: Never Used  . Alcohol Use: No    Review of Systems  Constitutional: Negative for fever.  Respiratory: Negative for shortness of breath.   Cardiovascular: Negative for chest pain.  Gastrointestinal: Positive for nausea and vomiting. Negative for abdominal  pain.  Genitourinary: Negative for dysuria and hematuria.  Musculoskeletal: Positive for arthralgias, back pain and myalgias.  Neurological: Negative for syncope.  All other systems reviewed and are negative.    Allergies  Review of patient's allergies indicates no known allergies.  Home Medications   Prior to Admission medications   Medication Sig Start Date End Date Taking? Authorizing Provider  b complex-vitamin c-folic acid (NEPHRO-VITE) 0.8 MG TABS tablet Take 1 tablet by mouth at bedtime.   Yes Historical Provider, MD  calcium acetate (PHOSLO) 667 MG capsule Take 2,001 mg by mouth 3 (three) times daily with meals.   Yes Historical Provider, MD  diphenhydrAMINE (BENADRYL) 50 MG capsule Take 50 mg by mouth every 6 (six) hours as needed for itching or allergies.   Yes Historical Provider, MD  folic acid (FOLVITE) 1 MG tablet Take 1 mg by mouth daily.   Yes Historical Provider, MD  HYDROmorphone (DILAUDID) 8 MG tablet Take 8 mg by mouth every 4 (four) hours as needed for severe pain.   Yes Historical Provider, MD  hydroxyurea (HYDREA) 500 MG capsule Take 500 mg by mouth 2 (two) times daily. May take with food to minimize GI side effects.   Yes Historical Provider, MD  lisinopril (PRINIVIL,ZESTRIL) 10 MG tablet Take 10 mg by mouth daily.   Yes Historical Provider, MD  promethazine (PHENERGAN) 25 MG tablet Take 25 mg by mouth every 6 (six) hours as needed for nausea or vomiting.  Yes Historical Provider, MD   BP 167/87  Pulse 94  Temp(Src) 98.4 F (36.9 C) (Oral)  Resp 20  SpO2 96%  Physical Exam  Nursing note and vitals reviewed. Constitutional: He is oriented to person, place, and time. He appears well-developed and well-nourished. No distress.  Patient appears uncomfortable. Nontoxic/nonseptic appearing.  HENT:  Head: Normocephalic and atraumatic.  Eyes: Conjunctivae and EOM are normal. No scleral icterus.  Neck: Normal range of motion.  Cardiovascular: Normal rate, regular  rhythm and normal heart sounds.   Patient not tachycardic as noted in triage  Pulmonary/Chest: Effort normal and breath sounds normal. No respiratory distress. He has no wheezes. He has no rales.  Chest expansion symmetric. No tachypnea or dyspnea.  Abdominal: Soft. He exhibits no distension. There is no tenderness. There is no rebound and no guarding.  Soft and nontender. No masses or peritoneal signs.  Musculoskeletal: Normal range of motion. He exhibits tenderness.  Mild TTP to b/l lumbar paraspinal muscles.  Neurological: He is alert and oriented to person, place, and time. He exhibits normal muscle tone. Coordination normal.  GCS 15. Patient moves extremities without ataxia. No sensory deficits appreciated. Speech is goal oriented.  Skin: Skin is warm and dry. No rash noted. He is not diaphoretic. No erythema. No pallor.  Psychiatric: He has a normal mood and affect. His behavior is normal.    ED Course  Procedures (including critical care time) Labs Review Labs Reviewed  CBC WITH DIFFERENTIAL - Abnormal; Notable for the following:    WBC 2.6 (*)    RBC 2.59 (*)    Hemoglobin 7.7 (*)    HCT 25.1 (*)    RDW 18.4 (*)    Monocytes Relative 16 (*)    Neutro Abs 1.6 (*)    Lymphs Abs 0.5 (*)    All other components within normal limits  RETICULOCYTES - Abnormal; Notable for the following:    Retic Ct Pct 5.8 (*)    RBC. 2.59 (*)    All other components within normal limits  LIPASE, BLOOD - Abnormal; Notable for the following:    Lipase 146 (*)    All other components within normal limits  COMPREHENSIVE METABOLIC PANEL - Abnormal; Notable for the following:    Sodium 135 (*)    Chloride 93 (*)    Glucose, Bld 100 (*)    BUN 27 (*)    Creatinine, Ser 6.49 (*)    Total Protein 9.2 (*)    Albumin 2.9 (*)    Alkaline Phosphatase 157 (*)    GFR calc non Af Amer 10 (*)    GFR calc Af Amer 12 (*)    All other components within normal limits    Imaging Review No results  found.   EKG Interpretation None      MDM   Final diagnoses:  Sickle-cell disease with pain    Patient presents to the ED today for pain c/w pain associated with sickle cell. Hx of [place] disease. Reticulocyte ct pct is elevated today to 5.8; per previous notes there was question surrounding patient's sickle cell status. Appears pain today is appropriate and c/w hx of sickle cell trait. No signs of acute chest. Labs otherwise c/w baseline. Lipase mildly elevated which may be secondary to frequent emesis PTA.  Patient's symptoms improved over ED course with 2mg  Dilaudid x 3, Benadryl, and Zofran. He states pain down to 4/10. VSS. Patient appears appropriate for discharge at this time with instruction to f/u  with his hematologist and PCP regarding today's crisis. Return precautions provided and patient agreeable to plan with no unaddressed concerns.   Filed Vitals:   07/18/14 0057 07/18/14 0309 07/18/14 0458  BP: 137/87 160/82 167/87  Pulse: 101 86 94  Temp: 98.7 F (37.1 C)  98.4 F (36.9 C)  TempSrc: Oral  Oral  Resp: 20 18 20   SpO2: 97% 96%        Antony MaduraKelly Wanette Robison, PA-C 07/18/14 2248

## 2014-07-19 NOTE — ED Provider Notes (Signed)
Medical screening examination/treatment/procedure(s) were performed by non-physician practitioner and as supervising physician I was immediately available for consultation/collaboration.   EKG Interpretation None        Hanley SeamenJohn L Kelby Lotspeich, MD 07/19/14 [phone]

## 2014-07-21 ENCOUNTER — Encounter (HOSPITAL_COMMUNITY): Payer: Self-pay | Admitting: Internal Medicine

## 2014-07-22 ENCOUNTER — Emergency Department (HOSPITAL_COMMUNITY)
Admission: EM | Admit: 2014-07-22 | Discharge: 2014-07-22 | Disposition: A | Discharge status: Home / Self Care | Payer: Medicare Other | Attending: Emergency Medicine | Admitting: Emergency Medicine

## 2014-07-22 ENCOUNTER — Encounter (HOSPITAL_COMMUNITY): Payer: Self-pay | Admitting: Emergency Medicine

## 2014-07-22 DIAGNOSIS — E119 Type 2 diabetes mellitus without complications: Secondary | ICD-10-CM | POA: Diagnosis not present

## 2014-07-22 DIAGNOSIS — Z79899 Other long term (current) drug therapy: Secondary | ICD-10-CM | POA: Diagnosis not present

## 2014-07-22 DIAGNOSIS — Z21 Asymptomatic human immunodeficiency virus [HIV] infection status: Secondary | ICD-10-CM | POA: Diagnosis not present

## 2014-07-22 DIAGNOSIS — F17200 Nicotine dependence, unspecified, uncomplicated: Secondary | ICD-10-CM | POA: Diagnosis not present

## 2014-07-22 DIAGNOSIS — Z992 Dependence on renal dialysis: Secondary | ICD-10-CM | POA: Diagnosis not present

## 2014-07-22 DIAGNOSIS — R112 Nausea with vomiting, unspecified: Secondary | ICD-10-CM | POA: Insufficient documentation

## 2014-07-22 DIAGNOSIS — D57819 Other sickle-cell disorders with crisis, unspecified: Secondary | ICD-10-CM | POA: Insufficient documentation

## 2014-07-22 DIAGNOSIS — Z794 Long term (current) use of insulin: Secondary | ICD-10-CM | POA: Insufficient documentation

## 2014-07-22 DIAGNOSIS — Z8719 Personal history of other diseases of the digestive system: Secondary | ICD-10-CM | POA: Insufficient documentation

## 2014-07-22 DIAGNOSIS — I120 Hypertensive chronic kidney disease with stage 5 chronic kidney disease or end stage renal disease: Secondary | ICD-10-CM | POA: Insufficient documentation

## 2014-07-22 DIAGNOSIS — N186 End stage renal disease: Secondary | ICD-10-CM | POA: Diagnosis not present

## 2014-07-22 DIAGNOSIS — D5700 Hb-SS disease with crisis, unspecified: Secondary | ICD-10-CM

## 2014-07-22 LAB — RETICULOCYTES
RBC.: 2.57 MIL/uL — ABNORMAL LOW (ref 4.22–5.81)
RETIC COUNT ABSOLUTE: 66.8 10*3/uL (ref 19.0–186.0)
RETIC CT PCT: 2.6 % (ref 0.4–3.1)

## 2014-07-22 LAB — CBC WITH DIFFERENTIAL/PLATELET
BASOS ABS: 0 10*3/uL (ref 0.0–0.1)
Basophils Relative: 0 % (ref 0–1)
EOS PCT: 2 % (ref 0–5)
Eosinophils Absolute: 0.1 10*3/uL (ref 0.0–0.7)
HCT: 24.8 % — ABNORMAL LOW (ref 39.0–52.0)
Hemoglobin: 7.8 g/dL — ABNORMAL LOW (ref 13.0–17.0)
LYMPHS ABS: 0.6 10*3/uL — AB (ref 0.7–4.0)
LYMPHS PCT: 22 % (ref 12–46)
MCH: 30.4 pg (ref 26.0–34.0)
MCHC: 31.5 g/dL (ref 30.0–36.0)
MCV: 96.5 fL (ref 78.0–100.0)
Monocytes Absolute: 0.3 10*3/uL (ref 0.1–1.0)
Monocytes Relative: 10 % (ref 3–12)
NEUTROS ABS: 1.9 10*3/uL (ref 1.7–7.7)
Neutrophils Relative %: 66 % (ref 43–77)
PLATELETS: 128 10*3/uL — AB (ref 150–400)
RBC: 2.57 MIL/uL — AB (ref 4.22–5.81)
RDW: 16.7 % — ABNORMAL HIGH (ref 11.5–15.5)
WBC: 2.9 10*3/uL — AB (ref 4.0–10.5)

## 2014-07-22 LAB — BASIC METABOLIC PANEL
Anion gap: 19 — ABNORMAL HIGH (ref 5–15)
BUN: 52 mg/dL — ABNORMAL HIGH (ref 6–23)
CALCIUM: 8.5 mg/dL (ref 8.4–10.5)
CHLORIDE: 93 meq/L — AB (ref 96–112)
CO2: 24 meq/L (ref 19–32)
CREATININE: 9.52 mg/dL — AB (ref 0.50–1.35)
GFR calc Af Amer: 7 mL/min — ABNORMAL LOW (ref >90)
GFR calc non Af Amer: 6 mL/min — ABNORMAL LOW (ref >90)
GLUCOSE: 80 mg/dL (ref 70–99)
Potassium: 4.5 mEq/L (ref 3.7–5.3)
Sodium: 136 mEq/L — ABNORMAL LOW (ref 137–147)

## 2014-07-22 MED ORDER — HYDROMORPHONE HCL PF 1 MG/ML IJ SOLN
2.0000 mg | Freq: Once | INTRAMUSCULAR | Status: AC
  Administered 2014-07-22: 2 mg via INTRAVENOUS
  Filled 2014-07-22: qty 2

## 2014-07-22 MED ORDER — ONDANSETRON HCL 4 MG/2ML IJ SOLN
4.0000 mg | Freq: Once | INTRAMUSCULAR | Status: AC
  Administered 2014-07-22: 4 mg via INTRAVENOUS
  Filled 2014-07-22: qty 2

## 2014-07-22 MED ORDER — LISINOPRIL 10 MG PO TABS
10.0000 mg | ORAL_TABLET | Freq: Once | ORAL | Status: AC
  Administered 2014-07-22: 10 mg via ORAL
  Filled 2014-07-22: qty 1

## 2014-07-22 MED ORDER — DIPHENHYDRAMINE HCL 12.5 MG/5ML PO ELIX
25.0000 mg | ORAL_SOLUTION | Freq: Once | ORAL | Status: DC
  Filled 2014-07-22: qty 10

## 2014-07-22 MED ORDER — PROMETHAZINE HCL 25 MG/ML IJ SOLN
12.5000 mg | Freq: Once | INTRAMUSCULAR | Status: AC
  Administered 2014-07-22: 12.5 mg via INTRAVENOUS
  Filled 2014-07-22 (×2): qty 1

## 2014-07-22 MED ORDER — DIPHENHYDRAMINE HCL 50 MG/ML IJ SOLN
12.5000 mg | Freq: Once | INTRAMUSCULAR | Status: AC
  Administered 2014-07-22: 12.5 mg via INTRAVENOUS
  Filled 2014-07-22: qty 1

## 2014-07-22 MED ORDER — SODIUM CHLORIDE 0.9 % IV BOLUS (SEPSIS)
500.0000 mL | Freq: Once | INTRAVENOUS | Status: AC
  Administered 2014-07-22: 500 mL via INTRAVENOUS

## 2014-07-22 NOTE — ED Provider Notes (Signed)
I saw and evaluated the patient, reviewed the resident's note and I agree with the findings and plan.   EKG Interpretation None      33 yo male with hx of SCD presenting with lower extremity pain.  He reports symptoms are consistent with prior sickle cell pain crises.  No chest pain, shortness of breath, fevers, cough.  On exam, well appearing, nontoxic, not distressed, normal respiratory effort, normal perfusion, full range of motion of lower extremity joints, joints aren't warm. Plan, IV pain control.  Pain controlled, dc'd home .  Clinical Impression: 1. Sickle cell pain crisis       Candyce ChurnJohn David Tecia Cinnamon III, MD 07/22/14 [id number]

## 2014-07-22 NOTE — Discharge Instructions (Signed)
Sickle Cell Anemia, Adult °Sickle cell anemia is a condition in which red blood cells have an abnormal "sickle" shape. This abnormal shape shortens the cells' life span, which results in a lower than normal concentration of red blood cells in the blood. The sickle shape also causes the cells to clump together and block free blood flow through the blood vessels. As a result, the tissues and organs of the body do not receive enough oxygen. Sickle cell anemia causes organ damage and pain and increases the risk of infection. °CAUSES  °Sickle cell anemia is a genetic disorder. Those who receive two copies of the gene have the condition, and those who receive one copy have the trait. °RISK FACTORS °The sickle cell gene is most common in people whose families originated in [place]. Other areas of the globe where sickle cell trait occurs include the Mediterranean, [place], the [place], and the [place].  °SIGNS AND SYMPTOMS °· Pain, especially in the extremities, back, chest, or abdomen (common). The pain may start suddenly or may develop following an illness, especially if there is dehydration. Pain can also occur due to overexertion or exposure to extreme temperature changes. °· Frequent severe bacterial infections, especially certain types of pneumonia and meningitis. °· Pain and swelling in the hands and feet. °· Decreased activity.   °· Loss of appetite.   °· Change in behavior. °· Headaches. °· Seizures. °· Shortness of breath or difficulty breathing. °· Vision changes. °· Skin ulcers. °Those with the trait may not have symptoms or they may have mild symptoms.  °DIAGNOSIS  °Sickle cell anemia is diagnosed with blood tests that demonstrate the genetic trait. It is often diagnosed during the newborn period, due to mandatory testing nationwide. A variety of blood tests, X-rays, CT scans, MRI scans, ultrasounds, and lung function tests may also be done to monitor the condition. °TREATMENT  °Sickle  cell anemia may be treated with: °· Medicines. You may be given pain medicines, antibiotic medicines (to treat and prevent infections) or medicines to increase the production of certain types of hemoglobin. °· Fluids. °· Oxygen. °· Blood transfusions. °HOME CARE INSTRUCTIONS  °· Drink enough fluid to keep your urine clear or pale yellow. Increase your fluid intake in hot weather and during exercise. °· Do not smoke. Smoking lowers oxygen levels in the blood.   °· Only take over-the-counter or prescription medicines for pain, fever, or discomfort as directed by your health care provider. °· Take antibiotics as directed by your health care provider. Make sure you finish them it even if you start to feel better.   °· Take supplements as directed by your health care provider.   °· Consider wearing a medical alert bracelet. This tells anyone caring for you in an emergency of your condition.   °· When traveling, keep your medical information, health care provider's names, and the medicines you take with you at all times.   °· If you develop a fever, do not take medicines to reduce the fever right away. This could cover up a problem that is developing. Notify your health care provider. °· Keep all follow-up appointments with your health care provider. Sickle cell anemia requires regular medical care. °SEEK MEDICAL CARE IF: ° You have a fever. °SEEK IMMEDIATE MEDICAL CARE IF:  °· You feel dizzy or faint.   °· You have new abdominal pain, especially on the left side near the stomach area.   °· You develop a persistent, often uncomfortable and painful penile erection (priapism). If this is not treated immediately it   will lead to impotence.   °· You have numbness your arms or legs or you have a hard time moving them.   °· You have a hard time with speech.   °· You have a fever or persistent symptoms for more than 2-3 days.   °· You have a fever and your symptoms suddenly get worse.   °· You have signs or symptoms of infection.  These include:   °¨ Chills.   °¨ Abnormal tiredness (lethargy).   °¨ Irritability.   °¨ Poor eating.   °¨ Vomiting.   °· You develop pain that is not helped with medicine.   °· You develop shortness of breath. °· You have pain in your chest.   °· You are coughing up pus-like or bloody sputum.   °· You develop a stiff neck. °· Your feet or hands swell or have pain. °· Your abdomen appears bloated. °· You develop joint pain. °MAKE SURE YOU: °· Understand these instructions. °Document Released: 03/15/2006 Document Revised: 04/21/2014 Document Reviewed: 07/17/2013 °[employer]® Patient Information ©2015 [employer]. This information is not intended to replace advice given to you by your health care provider. Make sure you discuss any questions you have with your health care provider. ° °

## 2014-07-22 NOTE — ED Notes (Addendum)
Pt states he is here for sickle cell pain to his back and nausea and vomiting. States the pain started yesterday. States is has not been able to hold liquids down since yesterday and hasn't been able to take his BP medication.

## 2014-07-22 NOTE — ED Notes (Signed)
Pt still feels nauseated and the pain is a little better. Dr. Loretha StaplerWofford at the bedside and informed but no further orders received at this time.

## 2014-07-22 NOTE — ED Provider Notes (Signed)
CSN: [id number]     Arrival date & time 07/22/14  0728 History   First MD Initiated Contact with Patient 07/22/14 [phone]     Chief Complaint  Patient presents with  . Sickle Cell Pain Crisis  . Nausea  . Emesis   [name] is a 33 yo AAM w/[place] sickle cell anemia and right hip AVM s/p hip surgery who presents today w/pain crisis. Patient says pain began last night he did not sleep well because of this his pain is in the normal locations of his lower legs and joints. Despite his home regimen of medication patient's pain has been out of control. He says since January he has been in and out of hospital about 8 times which is more than normal for him. He has not had to be admitted he just had to come to the ED. Patient says usually 3 rounds of Dilaudid improve his sx.   He denies CP, SOB, fever, chills, constipation, hematemesis, dysuria, hematuria, sick contacts, or recent travel.   (Consider location/radiation/quality/duration/timing/severity/associated sxs/prior Treatment) Patient is a 33 y.o. male presenting with sickle cell pain.  Sickle Cell Pain Crisis Location:  Lower extremity Severity:  Severe Onset quality:  Sudden Duration:  1 day Similar to previous crisis episodes: yes   Timing:  Constant Progression:  Unchanged Chronicity:  Recurrent Sickle cell genotype:  [place] Frequency of attacks:  [place] this year History of pulmonary emboli: no   Context: not alcohol consumption, not change in medication, not cold exposure, not dehydration and not infection   Relieved by:  Nothing Associated symptoms: nausea and vomiting   Associated symptoms: no chest pain, no fever, no headaches, no shortness of breath, no sore throat, no swelling of legs and no vision change     Past Medical History  Diagnosis Date  . DM (diabetes mellitus)   . Sickle cell anemia     States Dx at 33 yo, s/p multiple prior transfusions, typically with pain in legs, back  . HIV (human immunodeficiency  virus infection) 2003    Followed by Harless Nakayama at [facility], [facility]   . End stage renal disease on dialysis HD in 2009    Gets HD at home (?). Followed by Dr. Jack Quarto (sp?) in [place]. States due to HTN/DM. HD on T / Th / Sat  . HTN (hypertension)   . Pulmonary mass     Unclear history. States PCP was following a pulmonary mass with serial imaging  . Pancreatitis 2012    Denies any alcohol use   . Hypertension   . Diabetes mellitus   . Sickle cell anemia   . Renal failure   . Renal failure   . HIV (human immunodeficiency virus infection)   . Blood transfusion    Past Surgical History  Procedure Laterality Date  . Dialysis fistula creation    . Hip surgery      "screw placed into joint"  . Av fistula placement     Family History  Problem Relation Age of Onset  . Hypertension Father   . Diabetes      Uncles and grandfather  . Cervical cancer Mother     Dec 46yo of cervical ca   History  Substance Use Topics  . Smoking status: Current Every Day Smoker -- 0.25 packs/day for 10 years    Types: Cigarettes  . Smokeless tobacco: Never Used  . Alcohol Use: No    Review of Systems  Unable to perform ROS Constitutional:  Negative for fever and chills.  HENT: Negative for sore throat.   Respiratory: Negative for shortness of breath.   Cardiovascular: Negative for chest pain, palpitations and leg swelling.  Gastrointestinal: Positive for nausea and vomiting. Negative for abdominal pain, diarrhea, constipation and abdominal distention.  Genitourinary: Negative for dysuria, frequency, flank pain and decreased urine volume.  Musculoskeletal: Positive for arthralgias. Negative for joint swelling.  Skin: Negative for rash and wound.  Neurological: Negative for dizziness, speech difficulty, light-headedness and headaches.  All other systems reviewed and are negative.     Allergies  Review of patient's allergies indicates no known allergies.  Home Medications   Prior  to Admission medications   Medication Sig Start Date End Date Taking? Authorizing Provider  abacavir-lamiVUDine (EPZICOM) 600-300 MG per tablet Take 1 tablet by mouth daily.    Yes Historical Provider, MD  b complex-vitamin c-folic acid (NEPHRO-VITE) 0.8 MG TABS tablet Take 1 tablet by mouth at bedtime.   Yes Historical Provider, MD  calcium acetate (PHOSLO) 667 MG capsule Take 2,001 mg by mouth 3 (three) times daily with meals.   Yes Historical Provider, MD  darunavir (PREZISTA) 400 MG tablet Take 400 mg by mouth daily with breakfast.    Yes Historical Provider, MD  diphenhydrAMINE (BENADRYL) 50 MG capsule Take 50 mg by mouth every 6 (six) hours as needed for itching or allergies.   Yes Historical Provider, MD  folic acid (FOLVITE) 1 MG tablet Take 1 mg by mouth daily.   Yes Historical Provider, MD  HYDROmorphone (DILAUDID) 8 MG tablet Take 8 mg by mouth every 4 (four) hours as needed for severe pain.   Yes Historical Provider, MD  hydroxyurea (HYDREA) 500 MG capsule Take 500 mg by mouth 2 (two) times daily. May take with food to minimize GI side effects.   Yes Historical Provider, MD  insulin aspart (NOVOLOG) 100 UNIT/ML injection Inject 2-6 Units into the skin 3 (three) times daily before meals.    Yes Historical Provider, MD  insulin glargine (LANTUS) 100 UNIT/ML injection Inject 8 Units into the skin daily.   Yes Historical Provider, MD  lisinopril (PRINIVIL,ZESTRIL) 10 MG tablet Take 10 mg by mouth daily.   Yes Historical Provider, MD  Nutritional Supplements (FEEDING SUPPLEMENT, NEPRO CARB STEADY,) LIQD Take by mouth.   Yes Historical Provider, MD  promethazine (PHENERGAN) 25 MG tablet Take 25 mg by mouth every 6 (six) hours as needed for nausea or vomiting.   Yes Historical Provider, MD  ritonavir (NORVIR) 100 MG capsule Take 100 mg by mouth daily.    Yes Historical Provider, MD   BP 204/92  Temp(Src) 98.1 F (36.7 C) (Oral)  Resp 18  Ht 5\' 7"  (1.702 m)  Wt 185 lb (83.915 kg)  BMI  28.97 kg/m2  SpO2 100% Physical Exam  Nursing note and vitals reviewed. Constitutional: He is oriented to person, place, and time. He appears well-developed and well-nourished. No distress.  HENT:  Head: Normocephalic and atraumatic.  Eyes: Pupils are equal, round, and reactive to light.  Neck: Normal range of motion.  Cardiovascular: Normal rate, regular rhythm, normal heart sounds and intact distal pulses.  Exam reveals no gallop and no friction rub.   No murmur heard. Pulmonary/Chest: Effort normal and breath sounds normal. No respiratory distress. He has no wheezes. He has no rales. He exhibits no tenderness.  Abdominal: Soft. Bowel sounds are normal. He exhibits no distension and no mass. There is no tenderness. There is no rebound and no guarding.  Musculoskeletal: Normal range of motion.  Lymphadenopathy:    He has no cervical adenopathy.  Neurological: He is alert and oriented to person, place, and time.  Skin: Skin is warm and dry. He is not diaphoretic.    ED Course  Procedures (including critical care time) Labs Review Labs Reviewed  BASIC METABOLIC PANEL - Abnormal; Notable for the following:    Sodium 136 (*)    Chloride 93 (*)    BUN 52 (*)    Creatinine, Ser 9.52 (*)    GFR calc non Af Amer 6 (*)    GFR calc Af Amer 7 (*)    Anion gap 19 (*)    All other components within normal limits  RETICULOCYTES - Abnormal; Notable for the following:    RBC. 2.57 (*)    All other components within normal limits  CBC WITH DIFFERENTIAL - Abnormal; Notable for the following:    WBC 2.9 (*)    RBC 2.57 (*)    Hemoglobin 7.8 (*)    HCT 24.8 (*)    RDW 16.7 (*)    Platelets 128 (*)    Lymphs Abs 0.6 (*)    All other components within normal limits    Imaging Review No results found.   EKG Interpretation None      MDM   33 yo AAM w/sickle cell pain crisis. No sign of acute chest. Sating well on RA. AFVSS aside from HTN. No sign of infxn. Will proceed w/routine  pain control. Given zofran, dilaudid 2mg , and his home Lisinopril as he missed his dose.  Given 3 rounds of dilaudid and pain down to 6-7/10 from 10/10. Will attempt 1 more dose of dilaudid w/IV zofran and benadryl. If no improvement, will admit.   After 4th dose of dilaudid, pt felt much better and requested to go home. DCed home w/follow up to PCP. He will go to dialysis this PM. Strict return precautions include any SOB, F/chills, N/V, and PO intolerance.   Final diagnoses:  Sickle cell pain crisis    Pt was seen under the supervision of Dr. Loretha Stapler.     Rachelle Hora, MD 07/22/14 1758

## 2014-07-23 NOTE — ED Provider Notes (Signed)
I saw and evaluated the patient, reviewed the resident's note and I agree with the findings and plan.   EKG Interpretation None        Candyce ChurnJohn David Ardyn Forge III, MD 07/23/14 2151

## 2014-07-26 ENCOUNTER — Emergency Department: Payer: Self-pay | Admitting: Emergency Medicine

## 2014-07-26 LAB — BASIC METABOLIC PANEL
ANION GAP: 9 (ref 7–16)
BUN: 45 mg/dL — ABNORMAL HIGH (ref 7–18)
CO2: 27 mmol/L (ref 21–32)
CREATININE: 8.09 mg/dL — AB (ref 0.60–1.30)
Calcium, Total: 8.2 mg/dL — ABNORMAL LOW (ref 8.5–10.1)
Chloride: 99 mmol/L (ref 98–107)
EGFR (African American): 9 — ABNORMAL LOW
GFR CALC NON AF AMER: 8 — AB
Glucose: 97 mg/dL (ref 65–99)
Osmolality: 282 (ref 275–301)
POTASSIUM: 4 mmol/L (ref 3.5–5.1)
Sodium: 135 mmol/L — ABNORMAL LOW (ref 136–145)

## 2014-07-26 LAB — CBC WITH DIFFERENTIAL/PLATELET
BASOS ABS: 0 10*3/uL (ref 0.0–0.1)
Basophil %: 0.8 %
Eosinophil #: 0.1 10*3/uL (ref 0.0–0.7)
Eosinophil %: 1.8 %
HCT: 24.1 % — ABNORMAL LOW (ref 40.0–52.0)
HGB: 7.7 g/dL — AB (ref 13.0–18.0)
LYMPHS ABS: 0.5 10*3/uL — AB (ref 1.0–3.6)
Lymphocyte %: 15.5 %
MCH: 30.6 pg (ref 26.0–34.0)
MCHC: 31.9 g/dL — ABNORMAL LOW (ref 32.0–36.0)
MCV: 96 fL (ref 80–100)
Monocyte #: 0.3 x10 3/mm (ref 0.2–1.0)
Monocyte %: 8.8 %
Neutrophil #: 2.3 10*3/uL (ref 1.4–6.5)
Neutrophil %: 73.1 %
PLATELETS: 119 10*3/uL — AB (ref 150–440)
RBC: 2.51 10*6/uL — ABNORMAL LOW (ref 4.40–5.90)
RDW: 17.1 % — ABNORMAL HIGH (ref 11.5–14.5)
WBC: 3.2 10*3/uL — ABNORMAL LOW (ref 3.8–10.6)

## 2014-07-26 LAB — RETICULOCYTES
ABSOLUTE RETIC COUNT: 0.0451 10*6/uL (ref 0.019–0.186)
Reticulocyte: 1.8 % (ref 0.4–3.1)

## 2014-07-31 ENCOUNTER — Encounter (HOSPITAL_COMMUNITY): Payer: Self-pay | Admitting: Emergency Medicine

## 2014-07-31 ENCOUNTER — Emergency Department (HOSPITAL_COMMUNITY)
Admission: EM | Admit: 2014-07-31 | Discharge: 2014-08-01 | Disposition: A | Discharge status: Home / Self Care | Payer: Medicare Other | Attending: Emergency Medicine | Admitting: Emergency Medicine

## 2014-07-31 ENCOUNTER — Emergency Department (HOSPITAL_COMMUNITY): Payer: Medicare Other

## 2014-07-31 DIAGNOSIS — Z992 Dependence on renal dialysis: Secondary | ICD-10-CM | POA: Diagnosis not present

## 2014-07-31 DIAGNOSIS — N186 End stage renal disease: Secondary | ICD-10-CM | POA: Diagnosis not present

## 2014-07-31 DIAGNOSIS — R112 Nausea with vomiting, unspecified: Secondary | ICD-10-CM | POA: Insufficient documentation

## 2014-07-31 DIAGNOSIS — Z794 Long term (current) use of insulin: Secondary | ICD-10-CM | POA: Insufficient documentation

## 2014-07-31 DIAGNOSIS — E119 Type 2 diabetes mellitus without complications: Secondary | ICD-10-CM | POA: Insufficient documentation

## 2014-07-31 DIAGNOSIS — Z79899 Other long term (current) drug therapy: Secondary | ICD-10-CM | POA: Diagnosis not present

## 2014-07-31 DIAGNOSIS — D5700 Hb-SS disease with crisis, unspecified: Secondary | ICD-10-CM | POA: Diagnosis not present

## 2014-07-31 DIAGNOSIS — I120 Hypertensive chronic kidney disease with stage 5 chronic kidney disease or end stage renal disease: Secondary | ICD-10-CM | POA: Diagnosis not present

## 2014-07-31 DIAGNOSIS — F17200 Nicotine dependence, unspecified, uncomplicated: Secondary | ICD-10-CM | POA: Diagnosis not present

## 2014-07-31 DIAGNOSIS — Z21 Asymptomatic human immunodeficiency virus [HIV] infection status: Secondary | ICD-10-CM | POA: Diagnosis not present

## 2014-07-31 LAB — HEPATIC FUNCTION PANEL
ALT: 11 U/L (ref 0–53)
AST: 23 U/L (ref 0–37)
Albumin: 3 g/dL — ABNORMAL LOW (ref 3.5–5.2)
Alkaline Phosphatase: 181 U/L — ABNORMAL HIGH (ref 39–117)
Bilirubin, Direct: <0.2 mg/dL (ref 0.0–0.3)
Total Bilirubin: 0.4 mg/dL (ref 0.3–1.2)
Total Protein: 9.6 g/dL — ABNORMAL HIGH (ref 6.0–8.3)

## 2014-07-31 LAB — CBC WITH DIFFERENTIAL/PLATELET
Basophils Absolute: 0 10*3/uL (ref 0.0–0.1)
Basophils Relative: 1 % (ref 0–1)
EOS PCT: 3 % (ref 0–5)
Eosinophils Absolute: 0.1 10*3/uL (ref 0.0–0.7)
HEMATOCRIT: 27.6 % — AB (ref 39.0–52.0)
HEMOGLOBIN: 8.7 g/dL — AB (ref 13.0–17.0)
LYMPHS ABS: 0.5 10*3/uL — AB (ref 0.7–4.0)
LYMPHS PCT: 17 % (ref 12–46)
MCH: 29.5 pg (ref 26.0–34.0)
MCHC: 31.5 g/dL (ref 30.0–36.0)
MCV: 93.6 fL (ref 78.0–100.0)
MONO ABS: 0.2 10*3/uL (ref 0.1–1.0)
MONOS PCT: 9 % (ref 3–12)
NEUTROS ABS: 2 10*3/uL (ref 1.7–7.7)
Neutrophils Relative %: 70 % (ref 43–77)
Platelets: 155 10*3/uL (ref 150–400)
RBC: 2.95 MIL/uL — AB (ref 4.22–5.81)
RDW: 15.9 % — AB (ref 11.5–15.5)
WBC: 2.8 10*3/uL — AB (ref 4.0–10.5)

## 2014-07-31 LAB — BASIC METABOLIC PANEL
ANION GAP: 15 (ref 5–15)
BUN: 14 mg/dL (ref 6–23)
CALCIUM: 9.3 mg/dL (ref 8.4–10.5)
CHLORIDE: 90 meq/L — AB (ref 96–112)
CO2: 29 meq/L (ref 19–32)
CREATININE: 4.1 mg/dL — AB (ref 0.50–1.35)
GFR calc Af Amer: 20 mL/min — ABNORMAL LOW (ref >90)
GFR calc non Af Amer: 18 mL/min — ABNORMAL LOW (ref >90)
GLUCOSE: 170 mg/dL — AB (ref 70–99)
Potassium: 3.5 mEq/L — ABNORMAL LOW (ref 3.7–5.3)
Sodium: 134 mEq/L — ABNORMAL LOW (ref 137–147)

## 2014-07-31 LAB — TROPONIN I

## 2014-07-31 LAB — RETICULOCYTES
RBC.: 2.95 MIL/uL — ABNORMAL LOW (ref 4.22–5.81)
RETIC COUNT ABSOLUTE: 38.4 10*3/uL (ref 19.0–186.0)
Retic Ct Pct: 1.3 % (ref 0.4–3.1)

## 2014-07-31 LAB — LIPASE, BLOOD: Lipase: 93 U/L — ABNORMAL HIGH (ref 11–59)

## 2014-07-31 MED ORDER — DIPHENHYDRAMINE HCL 50 MG/ML IJ SOLN
12.5000 mg | Freq: Once | INTRAMUSCULAR | Status: AC
  Administered 2014-07-31: 12.5 mg via INTRAVENOUS
  Filled 2014-07-31: qty 1

## 2014-07-31 MED ORDER — PROMETHAZINE HCL 25 MG/ML IJ SOLN
12.5000 mg | Freq: Once | INTRAMUSCULAR | Status: AC
  Administered 2014-07-31: 12.5 mg via INTRAVENOUS
  Filled 2014-07-31: qty 1

## 2014-07-31 MED ORDER — HYDROMORPHONE HCL PF 1 MG/ML IJ SOLN
1.0000 mg | Freq: Once | INTRAMUSCULAR | Status: AC
  Administered 2014-07-31: 1 mg via INTRAVENOUS
  Filled 2014-07-31: qty 1

## 2014-07-31 MED ORDER — HYDROMORPHONE HCL PF 1 MG/ML IJ SOLN
2.0000 mg | Freq: Once | INTRAMUSCULAR | Status: AC
  Administered 2014-07-31: 2 mg via INTRAVENOUS
  Filled 2014-07-31: qty 2

## 2014-07-31 MED ORDER — ONDANSETRON 4 MG PO TBDP
4.0000 mg | ORAL_TABLET | Freq: Once | ORAL | Status: AC
  Administered 2014-07-31: 4 mg via ORAL
  Filled 2014-07-31: qty 1

## 2014-07-31 MED ORDER — SODIUM CHLORIDE 0.9 % IV BOLUS (SEPSIS)
250.0000 mL | Freq: Once | INTRAVENOUS | Status: AC
  Administered 2014-07-31: 250 mL via INTRAVENOUS

## 2014-07-31 NOTE — ED Notes (Signed)
Pt wants labs drawn in the back when he gets an IV

## 2014-07-31 NOTE — ED Notes (Signed)
Pt was informed that PA was currently busy and would be in shortly.

## 2014-07-31 NOTE — Discharge Instructions (Signed)
Please call your doctor for a followup appointment within 24-48 hours. When you talk to your doctor please let them know that you were seen in the emergency department and have them acquire all of your records so that they can discuss the findings with you and formulate a treatment plan to fully care for your new and ongoing problems. Please call and set-up an appointment with your primary care provider  Please rest and stay hydrated Please continue to take at home medications as prescribed Please continue to monitor symptoms closely and if symptoms are to worsen or change (fever greater than 101, chills, chest pain, shortness of breath, difficulty breathing, numbness, tingling, worsening or changes to pain pattern, nausea, vomiting, diarrhea, inability to control urine or bowel movements, fall, injury) is back to the ED immediately   Sickle Cell Anemia, Adult Sickle cell anemia is a condition in which red blood cells have an abnormal "sickle" shape. This abnormal shape shortens the cells' life span, which results in a lower than normal concentration of red blood cells in the blood. The sickle shape also causes the cells to clump together and block free blood flow through the blood vessels. As a result, the tissues and organs of the body do not receive enough oxygen. Sickle cell anemia causes organ damage and pain and increases the risk of infection. CAUSES  Sickle cell anemia is a genetic disorder. Those who receive two copies of the gene have the condition, and those who receive one copy have the trait. RISK FACTORS The sickle cell gene is most common in people whose families originated in [place]. Other areas of the globe where sickle cell trait occurs include the [place], [place], the [place], and the [place].  SIGNS AND SYMPTOMS  Pain, especially in the extremities, back, chest, or abdomen (common). The pain may start suddenly or may develop following an illness,  especially if there is dehydration. Pain can also occur due to overexertion or exposure to extreme temperature changes.  Frequent severe bacterial infections, especially certain types of pneumonia and meningitis.  Pain and swelling in the hands and feet.  Decreased activity.   Loss of appetite.   Change in behavior.  Headaches.  Seizures.  Shortness of breath or difficulty breathing.  Vision changes.  Skin ulcers. Those with the trait may not have symptoms or they may have mild symptoms.  DIAGNOSIS  Sickle cell anemia is diagnosed with blood tests that demonstrate the genetic trait. It is often diagnosed during the newborn period, due to mandatory testing nationwide. A variety of blood tests, X-rays, CT scans, MRI scans, ultrasounds, and lung function tests may also be done to monitor the condition. TREATMENT  Sickle cell anemia may be treated with:  Medicines. You may be given pain medicines, antibiotic medicines (to treat and prevent infections) or medicines to increase the production of certain types of hemoglobin.  Fluids.  Oxygen.  Blood transfusions. HOME CARE INSTRUCTIONS   Drink enough fluid to keep your urine clear or pale yellow. Increase your fluid intake in hot weather and during exercise.  Do not smoke. Smoking lowers oxygen levels in the blood.   Only take over-the-counter or prescription medicines for pain, fever, or discomfort as directed by your health care provider.  Take antibiotics as directed by your health care provider. Make sure you finish them it even if you start to feel better.   Take supplements as directed by your health care provider.   Consider wearing a medical  alert bracelet. This tells anyone caring for you in an emergency of your condition.   When traveling, keep your medical information, health care provider's names, and the medicines you take with you at all times.   If you develop a fever, do not take medicines to reduce  the fever right away. This could cover up a problem that is developing. Notify your health care provider.  Keep all follow-up appointments with your health care provider. Sickle cell anemia requires regular medical care. SEEK MEDICAL CARE IF: You have a fever. SEEK IMMEDIATE MEDICAL CARE IF:   You feel dizzy or faint.   You have new abdominal pain, especially on the left side near the stomach area.   You develop a persistent, often uncomfortable and painful penile erection (priapism). If this is not treated immediately it will lead to impotence.   You have numbness your arms or legs or you have a hard time moving them.   You have a hard time with speech.   You have a fever or persistent symptoms for more than 2-3 days.   You have a fever and your symptoms suddenly get worse.   You have signs or symptoms of infection. These include:   Chills.   Abnormal tiredness (lethargy).   Irritability.   Poor eating.   Vomiting.   You develop pain that is not helped with medicine.   You develop shortness of breath.  You have pain in your chest.   You are coughing up pus-like or bloody sputum.   You develop a stiff neck.  Your feet or hands swell or have pain.  Your abdomen appears bloated.  You develop joint pain. MAKE SURE YOU:  Understand these instructions. Document Released: 03/15/2006 Document Revised: 04/21/2014 Document Reviewed: 07/17/2013 [facility] Patient Information 2015 [place], [place]. This information is not intended to replace advice given to you by your health care provider. Make sure you discuss any questions you have with your health care provider.

## 2014-07-31 NOTE — ED Provider Notes (Signed)
CSN: [id number]     Arrival date & time 07/31/14  1743 History   First MD Initiated Contact with Patient 07/31/14 1834     Chief Complaint  Patient presents with  . Sickle Cell Pain Crisis  . Emesis     (Consider location/radiation/quality/duration/timing/severity/associated sxs/prior Treatment) The history is provided by the patient. No language interpreter was used.  [name] is a 33 y/o M with PMHx of HIV, end-stage renal disease on hemodialysis (Tuesday, Thursday, Saturday), diabetes, pancreatitis presenting to the ED with sickle cell pain that started today. Patient reports that the discomfort is localized to his lower back and all of his joints described as a constant aching sensation. Reported associated symptoms of nausea and vomiting-reported that he is unable to keep any of his medications down. Stated that when he has sickle cell pain that is how he normally presents. Denied fever, chills, neck pain, neck stiffness, chest pain, shortness of breath, difficulty breathing, abdominal pain, numbness, tingling, loss of sensation, weakness. Infectious disease Dr. Dannielle Huh Nephrologist Dr. Lyndal Pulley  Past Medical History  Diagnosis Date  . DM (diabetes mellitus)   . Sickle cell anemia     States Dx at 33 yo, s/p multiple prior transfusions, typically with pain in legs, back  . HIV (human immunodeficiency virus infection) 2003    Followed by Harless Nakayama at [facility]   . End stage renal disease on dialysis HD in 2009    Gets HD at home (?). Followed by Dr. Jack Quarto (sp?) in [place]. States due to HTN/DM. HD on T / Th / Sat  . HTN (hypertension)   . Pulmonary mass     Unclear history. States PCP was following a pulmonary mass with serial imaging  . Pancreatitis 2012    Denies any alcohol use   . Hypertension   . Diabetes mellitus   . Sickle cell anemia   . Renal failure   . Renal failure   . HIV (human immunodeficiency virus infection)   . Blood transfusion     Past Surgical History  Procedure Laterality Date  . Dialysis fistula creation    . Hip surgery      "screw placed into joint"  . Av fistula placement     Family History  Problem Relation Age of Onset  . Hypertension Father   . Diabetes      Uncles and grandfather  . Cervical cancer Mother     Dec 46yo of cervical ca   History  Substance Use Topics  . Smoking status: Current Every Day Smoker -- 0.25 packs/day for 10 years    Types: Cigarettes  . Smokeless tobacco: Never Used  . Alcohol Use: No    Review of Systems  Constitutional: Negative for fever and chills.  Eyes: Negative for visual disturbance.  Respiratory: Negative for chest tightness and shortness of breath.   Cardiovascular: Negative for chest pain.  Gastrointestinal: Positive for nausea and vomiting. Negative for abdominal pain, diarrhea, constipation, blood in stool and anal bleeding.  Musculoskeletal: Positive for arthralgias and back pain. Negative for neck pain and neck stiffness.  Neurological: Negative for dizziness, weakness and headaches.      Allergies  Review of patient's allergies indicates no known allergies.  Home Medications   Prior to Admission medications   Medication Sig Start Date End Date Taking? Authorizing Provider  abacavir-lamiVUDine (EPZICOM) 600-300 MG per tablet Take 1 tablet by mouth 2 (two) times daily.    Yes Historical Provider, MD  b complex-vitamin c-folic acid (NEPHRO-VITE) 0.8 MG TABS tablet Take 1 tablet by mouth at bedtime.   Yes Historical Provider, MD  calcium acetate (PHOSLO) 667 MG capsule Take 2,001 mg by mouth 3 (three) times daily with meals.   Yes Historical Provider, MD  darunavir (PREZISTA) 400 MG tablet Take 400 mg by mouth 2 (two) times daily.    Yes Historical Provider, MD  diphenhydrAMINE (BENADRYL) 50 MG capsule Take 50 mg by mouth every 6 (six) hours as needed for itching or allergies.   Yes Historical Provider, MD  folic acid (FOLVITE) 1 MG tablet Take 1  mg by mouth daily.   Yes Historical Provider, MD  HYDROmorphone (DILAUDID) 8 MG tablet Take 8 mg by mouth every 4 (four) hours as needed for severe pain.   Yes Historical Provider, MD  hydroxyurea (HYDREA) 500 MG capsule Take 500 mg by mouth 2 (two) times daily. May take with food to minimize GI side effects.   Yes Historical Provider, MD  insulin aspart (NOVOLOG) 100 UNIT/ML injection Inject 2-6 Units into the skin 3 (three) times daily before meals.    Yes Historical Provider, MD  insulin glargine (LANTUS) 100 UNIT/ML injection Inject 8 Units into the skin daily.   Yes Historical Provider, MD  lisinopril (PRINIVIL,ZESTRIL) 10 MG tablet Take 10 mg by mouth daily.   Yes Historical Provider, MD   BP 148/70  Pulse 94  Temp(Src) 99.3 F (37.4 C) (Oral)  Resp 17  SpO2 98% Physical Exam  Nursing note and vitals reviewed. Constitutional: He is oriented to person, place, and time. He appears well-developed and well-nourished. No distress.  Patient sitting comfortably at the edge of the bed talking on a cell phone  HENT:  Head: Normocephalic and atraumatic.  Mouth/Throat: Oropharynx is clear and moist. No oropharyngeal exudate.  Eyes: Conjunctivae and EOM are normal. Pupils are equal, round, and reactive to light. Right eye exhibits no discharge. Left eye exhibits no discharge.  Neck: Normal range of motion. Neck supple. No tracheal deviation present.  Cardiovascular: Normal rate, regular rhythm and normal heart sounds.  Exam reveals no friction rub.   No murmur heard. Cap refill less than 3 seconds Negative swelling or pitting edema identified to the lower tremors bilaterally  Pulmonary/Chest: Effort normal and breath sounds normal. No respiratory distress. He has no wheezes. He has no rales.  Patient stable to speak in full sentences without difficulty Negative use of accessory muscles Negative stridor  Abdominal: Soft. Bowel sounds are normal. He exhibits no distension. There is tenderness.  There is no rebound.  Negative abdominal distension  Bowel sounds normoactive in all 4 quadrants Discomfort upon palpation to the epigastric region Abdomen soft upon palpation Negative for tinnitus or rigidity, negative guarding  Musculoskeletal: Normal range of motion.  Full ROM to upper and lower extremities without difficulty noted, negative ataxia noted.  Lymphadenopathy:    He has no cervical adenopathy.  Neurological: He is alert and oriented to person, place, and time. No cranial nerve deficit. He exhibits normal muscle tone. Coordination normal.  Cranial nerves III-XII grossly intact Strength 5+/5+ to upper and lower extremities bilaterally with resistance applied, equal distribution noted Equal grip strength bilaterally  Negative facial drooping Negative slurred speech  Negative aphasia Gait proper, proper balance - negative sway, negative drift, negative step-offs  Skin: Skin is warm and dry. No rash noted. He is not diaphoretic. No erythema.  Psychiatric: He has a normal mood and affect. His behavior is normal. Thought content  normal.    ED Course  Procedures (including critical care time)  11:43 PM Patient pacing the floor. Stated that he is ready to go and would like to leave. Pain controlled.   Results for orders placed during the hospital encounter of 07/31/14  CBC WITH DIFFERENTIAL      Result Value Ref Range   WBC 2.8 (*) 4.0 - 10.5 K/uL   RBC 2.95 (*) 4.22 - 5.81 MIL/uL   Hemoglobin 8.7 (*) 13.0 - 17.0 g/dL   HCT 16.1 (*) 09.6 - 04.5 %   MCV 93.6  78.0 - 100.0 fL   MCH 29.5  26.0 - 34.0 pg   MCHC 31.5  30.0 - 36.0 g/dL   RDW 40.9 (*) 81.1 - 91.4 %   Platelets 155  150 - 400 K/uL   Neutrophils Relative % 70  43 - 77 %   Neutro Abs 2.0  1.7 - 7.7 K/uL   Lymphocytes Relative 17  12 - 46 %   Lymphs Abs 0.5 (*) 0.7 - 4.0 K/uL   Monocytes Relative 9  3 - 12 %   Monocytes Absolute 0.2  0.1 - 1.0 K/uL   Eosinophils Relative 3  0 - 5 %   Eosinophils Absolute 0.1   0.0 - 0.7 K/uL   Basophils Relative 1  0 - 1 %   Basophils Absolute 0.0  0.0 - 0.1 K/uL  BASIC METABOLIC PANEL      Result Value Ref Range   Sodium 134 (*) 137 - 147 mEq/L   Potassium 3.5 (*) 3.7 - 5.3 mEq/L   Chloride 90 (*) 96 - 112 mEq/L   CO2 29  19 - 32 mEq/L   Glucose, Bld 170 (*) 70 - 99 mg/dL   BUN 14  6 - 23 mg/dL   Creatinine, Ser 7.82 (*) 0.50 - 1.35 mg/dL   Calcium 9.3  8.4 - 95.6 mg/dL   GFR calc non Af Amer 18 (*) >90 mL/min   GFR calc Af Amer 20 (*) >90 mL/min   Anion gap 15  5 - 15  RETICULOCYTES      Result Value Ref Range   Retic Ct Pct 1.3  0.4 - 3.1 %   RBC. 2.95 (*) 4.22 - 5.81 MIL/uL   Retic Count, Manual 38.4  19.0 - 186.0 K/uL  TROPONIN I      Result Value Ref Range   Troponin I <0.30  <0.30 ng/mL  HEPATIC FUNCTION PANEL      Result Value Ref Range   Total Protein 9.6 (*) 6.0 - 8.3 g/dL   Albumin 3.0 (*) 3.5 - 5.2 g/dL   AST 23  0 - 37 U/L   ALT 11  0 - 53 U/L   Alkaline Phosphatase 181 (*) 39 - 117 U/L   Total Bilirubin 0.4  0.3 - 1.2 mg/dL   Bilirubin, Direct <2.1  0.0 - 0.3 mg/dL   Indirect Bilirubin NOT CALCULATED  0.3 - 0.9 mg/dL  LIPASE, BLOOD      Result Value Ref Range   Lipase 93 (*) 11 - 59 U/L    Labs Review Labs Reviewed  CBC WITH DIFFERENTIAL - Abnormal; Notable for the following:    WBC 2.8 (*)    RBC 2.95 (*)    Hemoglobin 8.7 (*)    HCT 27.6 (*)    RDW 15.9 (*)    Lymphs Abs 0.5 (*)    All other components within normal limits  BASIC METABOLIC PANEL -  Abnormal; Notable for the following:    Sodium 134 (*)    Potassium 3.5 (*)    Chloride 90 (*)    Glucose, Bld 170 (*)    Creatinine, Ser 4.10 (*)    GFR calc non Af Amer 18 (*)    GFR calc Af Amer 20 (*)    All other components within normal limits  RETICULOCYTES - Abnormal; Notable for the following:    RBC. 2.95 (*)    All other components within normal limits  HEPATIC FUNCTION PANEL - Abnormal; Notable for the following:    Total Protein 9.6 (*)    Albumin 3.0  (*)    Alkaline Phosphatase 181 (*)    All other components within normal limits  LIPASE, BLOOD - Abnormal; Notable for the following:    Lipase 93 (*)    All other components within normal limits  TROPONIN I    Imaging Review Dg Chest 2 View  07/31/2014   CLINICAL DATA:  Chest pain.  Possible sickle cell crisis.  EXAM: CHEST  2 VIEW  COMPARISON:  Chest x-ray 03/11/2012.  FINDINGS: Diffusely coarsened interstitial markings appear similar to the prior examination and are likely chronic. No consolidative airspace disease. No pleural effusions. No evidence of pulmonary edema. Heart size is mildly enlarged. Upper mediastinal contours are within normal limits. Vascular stents are noted in the right subclavian region and in the left axillary and subclavian regions.  IMPRESSION: 1. Diffuse coarsening of interstitial markings, similar to the prior examination, likely chronic. 2. No focal airspace consolidation to suggest acute chest syndrome at this time. 3. Mild cardiomegaly.   Electronically Signed   By: Trudie Reedaniel  Entrikin M.D.   On: 07/31/2014 21:36     EKG Interpretation   Date/Time:  Thursday July 31 2014 19:11:32 EDT Ventricular Rate:  99 PR Interval:  186 QRS Duration: 94 QT Interval:  415 QTC Calculation: 533 R Axis:   140 Text Interpretation:  Sinus rhythm Anterior infarct, old Prolonged QT  interval Confirmed by DOCHERTY  MD, MEGAN [phone] on 07/31/2014 7:15:02 PM      MDM   Final diagnoses:  Sickle cell anemia with pain    Medications  sodium chloride 0.9 % bolus 250 mL (0 mLs Intravenous Stopped 07/31/14 2341)  HYDROmorphone (DILAUDID) injection 2 mg (2 mg Intravenous Given 07/31/14 2025)  ondansetron (ZOFRAN-ODT) disintegrating tablet 4 mg (4 mg Oral Given 07/31/14 2025)  diphenhydrAMINE (BENADRYL) injection 12.5 mg (12.5 mg Intravenous Given 07/31/14 2025)  HYDROmorphone (DILAUDID) injection 1 mg (1 mg Intravenous Given 07/31/14 2220)  promethazine (PHENERGAN) injection 12.5  mg (12.5 mg Intravenous Given 07/31/14 2219)  HYDROmorphone (DILAUDID) injection 2 mg (2 mg Intravenous Given 07/31/14 2254)  promethazine (PHENERGAN) injection 12.5 mg (12.5 mg Intravenous Given 07/31/14 2255)  diphenhydrAMINE (BENADRYL) injection 12.5 mg (12.5 mg Intravenous Given 07/31/14 2258)    Filed Vitals:   07/31/14 1945 07/31/14 2048 07/31/14 2152 07/31/14 2217  BP: 147/70 155/78 148/80 148/70  Pulse: 94   94  Temp:      TempSrc:      Resp: 18 18 17    SpO2: 97% 98% 99% 98%   EKG noted sinus rhythm with a prolonged QT interval. Troponin negative elevation. CBC noted mildly low white blood cell count of 2.8. Hemoglobin 8.7-patient is been as low as 7.7. BNP noted elevated creatinine of 4.10. Glucose 170. Negative elevated anion gap. RBCs 2.95. Negative findings of hyperkalemia. Hepatic function panel within normal limits mildly elevated alkaline phosphatase of  181. Lipase mildly elevated at 93 - when compared to one week ago patient had a lipase of 146. Chest x-ray noted diffuse coarsening of the interstitial markings, to the prior examination-likely chronic. No focal airspace consolidation to suggest acute chest syndrome at this time. Pain medications administered in ED setting with positive relief. Patient tolerated eyes checked without episodes of emesis while in ED setting. Doubt acute chest syndrome. Patient presenting to the ED with sickle cell pain without crisis. Patient stable, afebrile. Patient not septic appearing. Patient became aggravated, pacing the floors reported that he's ready to go home. This provider discharged patient. Discussed with patient to rest and stay hydrated. Discussed with patient to continue to take medications as prescribed. Referred to PCP. Discussed with patient to closely monitor symptoms and if symptoms are to worsen or change to report back to the ED - strict return instructions given.  Patient agreed to plan of care, understood, all questions answered.    Raymon Mutton, PA-C 08/01/14 0140

## 2014-07-31 NOTE — ED Notes (Signed)
Pt requesting to speak with Thomasene RippleMelissa Charge nurse, Melissa informed pt that PA is aware pt would like something else for pain, and she will be in as soon as she can. Pt satisfied with this answer.

## 2014-07-31 NOTE — ED Notes (Signed)
Pt requesting to speak with Charge nurse, pt unhappy with how long it takes for him to get medications for pain. Melissa RN notified.

## 2014-07-31 NOTE — ED Notes (Signed)
Pt is here with sickle cell crisis and complains hurting in all joints and back.  Pt reports nausea and vomiting too.  No fever

## 2014-08-01 NOTE — ED Provider Notes (Signed)
Medical screening examination/treatment/procedure(s) were performed by non-physician practitioner and as supervising physician I was immediately available for consultation/collaboration.  Krystal Delduca, MD 08/01/14 2034 

## 2014-08-05 ENCOUNTER — Emergency Department (HOSPITAL_COMMUNITY)
Admission: EM | Admit: 2014-08-05 | Discharge: 2014-08-05 | Disposition: A | Discharge status: Home / Self Care | Payer: Medicare Other | Attending: Emergency Medicine | Admitting: Emergency Medicine

## 2014-08-05 ENCOUNTER — Encounter (HOSPITAL_COMMUNITY): Payer: Self-pay | Admitting: Emergency Medicine

## 2014-08-05 DIAGNOSIS — I120 Hypertensive chronic kidney disease with stage 5 chronic kidney disease or end stage renal disease: Secondary | ICD-10-CM | POA: Diagnosis not present

## 2014-08-05 DIAGNOSIS — Z21 Asymptomatic human immunodeficiency virus [HIV] infection status: Secondary | ICD-10-CM | POA: Insufficient documentation

## 2014-08-05 DIAGNOSIS — D5700 Hb-SS disease with crisis, unspecified: Secondary | ICD-10-CM | POA: Insufficient documentation

## 2014-08-05 DIAGNOSIS — R1013 Epigastric pain: Secondary | ICD-10-CM | POA: Diagnosis not present

## 2014-08-05 DIAGNOSIS — Z794 Long term (current) use of insulin: Secondary | ICD-10-CM | POA: Diagnosis not present

## 2014-08-05 DIAGNOSIS — R112 Nausea with vomiting, unspecified: Secondary | ICD-10-CM | POA: Diagnosis not present

## 2014-08-05 DIAGNOSIS — Z992 Dependence on renal dialysis: Secondary | ICD-10-CM | POA: Diagnosis not present

## 2014-08-05 DIAGNOSIS — E119 Type 2 diabetes mellitus without complications: Secondary | ICD-10-CM | POA: Insufficient documentation

## 2014-08-05 DIAGNOSIS — F17200 Nicotine dependence, unspecified, uncomplicated: Secondary | ICD-10-CM | POA: Diagnosis not present

## 2014-08-05 DIAGNOSIS — Z79899 Other long term (current) drug therapy: Secondary | ICD-10-CM | POA: Diagnosis not present

## 2014-08-05 DIAGNOSIS — N186 End stage renal disease: Secondary | ICD-10-CM | POA: Insufficient documentation

## 2014-08-05 LAB — LIPASE, BLOOD: Lipase: 144 U/L — ABNORMAL HIGH (ref 11–59)

## 2014-08-05 LAB — CBC WITH DIFFERENTIAL/PLATELET
Basophils Absolute: 0 10*3/uL (ref 0.0–0.1)
Basophils Relative: 1 % (ref 0–1)
EOS ABS: 0.1 10*3/uL (ref 0.0–0.7)
Eosinophils Relative: 5 % (ref 0–5)
HCT: 26.4 % — ABNORMAL LOW (ref 39.0–52.0)
HEMOGLOBIN: 8.6 g/dL — AB (ref 13.0–17.0)
LYMPHS ABS: 0.5 10*3/uL — AB (ref 0.7–4.0)
LYMPHS PCT: 22 % (ref 12–46)
MCH: 30.3 pg (ref 26.0–34.0)
MCHC: 32.6 g/dL (ref 30.0–36.0)
MCV: 93 fL (ref 78.0–100.0)
MONOS PCT: 7 % (ref 3–12)
Monocytes Absolute: 0.2 10*3/uL (ref 0.1–1.0)
NEUTROS ABS: 1.6 10*3/uL — AB (ref 1.7–7.7)
NEUTROS PCT: 65 % (ref 43–77)
PLATELETS: 159 10*3/uL (ref 150–400)
RBC: 2.84 MIL/uL — AB (ref 4.22–5.81)
RDW: 15.9 % — ABNORMAL HIGH (ref 11.5–15.5)
WBC: 2.4 10*3/uL — AB (ref 4.0–10.5)

## 2014-08-05 LAB — COMPREHENSIVE METABOLIC PANEL
ALT: 10 U/L (ref 0–53)
ANION GAP: 21 — AB (ref 5–15)
AST: 19 U/L (ref 0–37)
Albumin: 2.9 g/dL — ABNORMAL LOW (ref 3.5–5.2)
Alkaline Phosphatase: 202 U/L — ABNORMAL HIGH (ref 39–117)
BILIRUBIN TOTAL: 0.4 mg/dL (ref 0.3–1.2)
BUN: 55 mg/dL — AB (ref 6–23)
CHLORIDE: 92 meq/L — AB (ref 96–112)
CO2: 21 meq/L (ref 19–32)
Calcium: 9 mg/dL (ref 8.4–10.5)
Creatinine, Ser: 9.9 mg/dL — ABNORMAL HIGH (ref 0.50–1.35)
GFR, EST AFRICAN AMERICAN: 7 mL/min — AB (ref >90)
GFR, EST NON AFRICAN AMERICAN: 6 mL/min — AB (ref >90)
Glucose, Bld: 83 mg/dL (ref 70–99)
Potassium: 4.5 mEq/L (ref 3.7–5.3)
SODIUM: 134 meq/L — AB (ref 137–147)
Total Protein: 9.3 g/dL — ABNORMAL HIGH (ref 6.0–8.3)

## 2014-08-05 LAB — RETICULOCYTES
RBC.: 2.84 MIL/uL — ABNORMAL LOW (ref 4.22–5.81)
Retic Count, Absolute: 54 10*3/uL (ref 19.0–186.0)
Retic Ct Pct: 1.9 % (ref 0.4–3.1)

## 2014-08-05 MED ORDER — SODIUM CHLORIDE 0.9 % IV BOLUS (SEPSIS)
250.0000 mL | Freq: Once | INTRAVENOUS | Status: AC
  Administered 2014-08-05: 250 mL via INTRAVENOUS

## 2014-08-05 MED ORDER — HYDROMORPHONE HCL PF 1 MG/ML IJ SOLN
2.0000 mg | Freq: Once | INTRAMUSCULAR | Status: AC
  Administered 2014-08-05: 2 mg via INTRAVENOUS
  Filled 2014-08-05: qty 2

## 2014-08-05 MED ORDER — DIPHENHYDRAMINE HCL 50 MG/ML IJ SOLN
25.0000 mg | Freq: Once | INTRAMUSCULAR | Status: AC
  Administered 2014-08-05: 25 mg via INTRAVENOUS
  Filled 2014-08-05: qty 1

## 2014-08-05 MED ORDER — PROMETHAZINE HCL 25 MG/ML IJ SOLN
12.5000 mg | Freq: Once | INTRAMUSCULAR | Status: AC
  Administered 2014-08-05: 12.5 mg via INTRAVENOUS
  Filled 2014-08-05: qty 1

## 2014-08-05 MED ORDER — ONDANSETRON HCL 4 MG/2ML IJ SOLN
4.0000 mg | Freq: Once | INTRAMUSCULAR | Status: AC
  Administered 2014-08-05: 4 mg via INTRAVENOUS
  Filled 2014-08-05: qty 2

## 2014-08-05 NOTE — ED Notes (Addendum)
Pt states the nausea unchanged and states he had x1 episode of emesis and put in the trash can.  This RN noted no emesis in the trash can.  Pt given an emesis bag to use for future episodes.  Pt alert and sitting in the bed playing with his phone.

## 2014-08-05 NOTE — ED Notes (Signed)
Pt took oxygen and heart monitor leads off. Pt asked if staff could put them back on. Pt refused, sts "I'm ready to leave now so I don't need that".

## 2014-08-05 NOTE — ED Notes (Signed)
Pt advised that due to him still feeling nauseated I was holding the Dilaudid until Phenergan came from pharmacy.

## 2014-08-05 NOTE — ED Notes (Signed)
N/v and sickle cell pain since last night

## 2014-08-05 NOTE — ED Notes (Signed)
MD Katrinka BlazingSmith advise that pt requesting another round of Dilaudid with pain 6/10.  Advised that pt in the room, playing on his phone with stable vital signs.

## 2014-08-05 NOTE — ED Provider Notes (Signed)
CSN: [id number]     Arrival date & time 08/05/14  0727 History   First MD Initiated Contact with Patient 08/05/14 0809     Chief Complaint  Patient presents with  . Sickle Cell Pain Crisis   Dontavian Marchi is a 33 yo AAM w/PMH of DM, sickle cell anemia, HIV, ESRD on HD MWF at home, and HTN who presents today w/pain crisis. Has been here 7 times in the last 2 months for the same complaint. Home regiment is 8 mg dilaudid Q6H and hydroxyurea BID. Pain in joints.   He denies CP, SOB, fever, chills, constipation, hematemesis, dysuria, hematuria, sick contacts, or recent travel.   (Consider location/radiation/quality/duration/timing/severity/associated sxs/prior Treatment) Patient is a 33 y.o. male presenting with sickle cell pain.  Sickle Cell Pain Crisis Pain location: joints. Severity:  Severe Onset quality:  Gradual Duration:  2 days Similar to previous crisis episodes: yes   Timing:  Constant Sickle cell genotype:  Minford Frequency of attacks:  Worsening Context: dehydration   Relieved by:  Nothing Worsened by:  Nothing tried Ineffective treatments:  None tried Associated symptoms: nausea and vomiting   Associated symptoms: no chest pain, no congestion, no cough, no fatigue, no fever, no headaches, no priapism, no shortness of breath, no sore throat and no wheezing   Risk factors: frequent pain crises and prior acute chest     Past Medical History  Diagnosis Date  . DM (diabetes mellitus)   . Sickle cell anemia     States Dx at 33 yo, s/p multiple prior transfusions, typically with pain in legs, back  . HIV (human immunodeficiency virus infection) 2003    Followed by Harless Nakayama at [facility], [facility]   . End stage renal disease on dialysis HD in 2009    Gets HD at home (?). Followed by Dr. Jack Quarto (sp?) in [place]. States due to HTN/DM. HD on T / Th / Sat  . HTN (hypertension)   . Pulmonary mass     Unclear history. States PCP was following a pulmonary mass with  serial imaging  . Pancreatitis 2012    Denies any alcohol use   . Hypertension   . Diabetes mellitus   . Sickle cell anemia   . Renal failure   . Renal failure   . HIV (human immunodeficiency virus infection)   . Blood transfusion    Past Surgical History  Procedure Laterality Date  . Dialysis fistula creation    . Hip surgery      "screw placed into joint"  . Av fistula placement     Family History  Problem Relation Age of Onset  . Hypertension Father   . Diabetes      Uncles and grandfather  . Cervical cancer Mother     Dec 46yo of cervical ca   History  Substance Use Topics  . Smoking status: Current Every Day Smoker -- 0.25 packs/day for 10 years    Types: Cigarettes  . Smokeless tobacco: Never Used  . Alcohol Use: No    Review of Systems  Unable to perform ROS Constitutional: Negative for fever, chills and fatigue.  HENT: Negative for congestion and sore throat.   Respiratory: Negative for cough, shortness of breath and wheezing.   Cardiovascular: Negative for chest pain, palpitations and leg swelling.  Gastrointestinal: Positive for nausea and vomiting. Negative for abdominal pain, diarrhea, constipation and abdominal distention.  Genitourinary: Negative for dysuria, frequency, flank pain and decreased urine volume.  Musculoskeletal: Positive for  arthralgias. Negative for joint swelling.  Neurological: Negative for dizziness, speech difficulty, light-headedness and headaches.  All other systems reviewed and are negative.     Allergies  Review of patient's allergies indicates no known allergies.  Home Medications   Prior to Admission medications   Medication Sig Start Date End Date Taking? Authorizing Provider  abacavir-lamiVUDine (EPZICOM) 600-300 MG per tablet Take 1 tablet by mouth 2 (two) times daily.    Yes Historical Provider, MD  b complex-vitamin c-folic acid (NEPHRO-VITE) 0.8 MG TABS tablet Take 1 tablet by mouth at bedtime.   Yes Historical  Provider, MD  calcium acetate (PHOSLO) 667 MG capsule Take 2,001 mg by mouth 3 (three) times daily with meals.   Yes Historical Provider, MD  darunavir (PREZISTA) 400 MG tablet Take 400 mg by mouth 2 (two) times daily.    Yes Historical Provider, MD  diphenhydrAMINE (BENADRYL) 50 MG capsule Take 50 mg by mouth every 6 (six) hours as needed for itching or allergies.   Yes Historical Provider, MD  folic acid (FOLVITE) 1 MG tablet Take 1 mg by mouth daily.   Yes Historical Provider, MD  HYDROmorphone (DILAUDID) 8 MG tablet Take 8 mg by mouth every 4 (four) hours as needed for severe pain.   Yes Historical Provider, MD  hydroxyurea (HYDREA) 500 MG capsule Take 500 mg by mouth 2 (two) times daily. May take with food to minimize GI side effects.   Yes Historical Provider, MD  insulin aspart (NOVOLOG) 100 UNIT/ML injection Inject 2-6 Units into the skin 3 (three) times daily before meals.    Yes Historical Provider, MD  insulin glargine (LANTUS) 100 UNIT/ML injection Inject 8 Units into the skin daily.   Yes Historical Provider, MD  lisinopril (PRINIVIL,ZESTRIL) 10 MG tablet Take 10 mg by mouth daily.   Yes Historical Provider, MD   BP 177/85  Pulse 89  Temp(Src) 97.8 F (36.6 C) (Oral)  Resp 18  SpO2 100% Physical Exam  Nursing note and vitals reviewed. Constitutional: He is oriented to person, place, and time. He appears well-developed and well-nourished. No distress.  HENT:  Head: Normocephalic and atraumatic.  MM slightly dry.  Eyes: Pupils are equal, round, and reactive to light.  Neck: Normal range of motion.  Cardiovascular: Normal rate, regular rhythm, normal heart sounds and intact distal pulses.  Exam reveals no gallop and no friction rub.   No murmur heard. Pulmonary/Chest: Effort normal and breath sounds normal. No respiratory distress. He has no wheezes. He has no rales. He exhibits no tenderness.  Abdominal: Soft. Bowel sounds are normal. He exhibits no distension and no mass.  There is tenderness (generalized, worse in epigastric). There is no rebound and no guarding.  Musculoskeletal: Normal range of motion. He exhibits no edema and no tenderness.  Lymphadenopathy:    He has no cervical adenopathy.  Neurological: He is alert and oriented to person, place, and time.  Skin: Skin is warm and dry. He is not diaphoretic.    ED Course  Procedures (including critical care time) Labs Review Labs Reviewed  CBC WITH DIFFERENTIAL - Abnormal; Notable for the following:    WBC 2.4 (*)    RBC 2.84 (*)    Hemoglobin 8.6 (*)    HCT 26.4 (*)    RDW 15.9 (*)    Neutro Abs 1.6 (*)    Lymphs Abs 0.5 (*)    All other components within normal limits  COMPREHENSIVE METABOLIC PANEL - Abnormal; Notable for the following:  Sodium 134 (*)    Chloride 92 (*)    BUN 55 (*)    Creatinine, Ser 9.90 (*)    Total Protein 9.3 (*)    Albumin 2.9 (*)    Alkaline Phosphatase 202 (*)    GFR calc non Af Amer 6 (*)    GFR calc Af Amer 7 (*)    Anion gap 21 (*)    All other components within normal limits  RETICULOCYTES - Abnormal; Notable for the following:    RBC. 2.84 (*)    All other components within normal limits  LIPASE, BLOOD - Abnormal; Notable for the following:    Lipase 144 (*)    All other components within normal limits    Imaging Review No results found.   EKG Interpretation None      MDM   33 yo AAM w/sickle cell pain crisis. Please see HPI for details. On exam, pt in NAD, AFVSS aside from HTN. Sating 100% on RA, no dyspnea noted. [name] pain is typical of his pain crises. Discussed pt's goals today. His goal is to get some pain control and to control his N/V. Given dilaudid, benadryl, zofran, and 250cc bolus of NS. No signs of acute abd. Will obtain CBC, CMP, retic count.   8:50 Round 1. Pain remains at 8/10.   10:00 Round 2. Pain down to 6/10. Pt feels like he's almost ready to go, but would like his pain a bit more controlled.  11:00 AM Round 3.    Labs show slightly elevated lipase, but past labs have shown frequent levels at this amt. Doubt acute pancreatitis. Hgb at baseline.   After 3rd round, pain down to 3/10. Wants to go home. Stable for DC. FU w/hematologist or PCP as outpt.   Final diagnoses:  Sickle cell pain crisis    Pt was seen under the supervision of Dr. Jeraldine LootsLockwood.     Rachelle HoraKeri Jareth Pardee, MD 08/05/14 1159

## 2014-08-05 NOTE — ED Notes (Addendum)
Pt lingering in the room asked and received a coke and continuing to wipe hand and shirt with alcohol swabs.  Tech at bedside assisting pt out of the room.  Pt walked out with this RN stating that he was reporting nursing staff for not promptly responding to his multiple request.  Nursing staff was in his room hourly if not more frequent.  Pt was quick to state he knew more about how medications and how the doctors should treat him and did not need to be told by a "new RN" what or how this RN would administer his medications.  Pt constantly playing on his phone and acting irritated with staff.

## 2014-08-05 NOTE — ED Notes (Signed)
Pt states he wants the pain medication at this time. Informed that we are still waiting for nausea medication.

## 2014-08-05 NOTE — ED Notes (Signed)
Pt st's he is ready to go home 

## 2014-08-05 NOTE — ED Notes (Signed)
Pt requesting alcohol swabs, pt asked what he needs them for. Pt sts "for this right here" (pointing to his hands). Pt given 2 alcohol swabs to clean his hands.

## 2014-08-05 NOTE — Discharge Instructions (Signed)
Sickle Cell Anemia °Sickle cell anemia is a condition where your red blood cells are shaped like sickles. Red blood cells carry oxygen through the body. Sickle-shaped red blood cells do not live as long as normal red blood cells. They also clump together and block blood from flowing through the blood vessels. These things prevent the body from getting enough oxygen. Sickle cell anemia causes organ damage and pain. It also increases the risk of infection. °HOME CARE °· Drink enough fluid to keep your pee (urine) clear or pale yellow. Drink more in hot weather and during exercise. °· Do not smoke. Smoking lowers oxygen levels in the blood. °· Only take over-the-counter or prescription medicines as told by your doctor. °· Take antibiotic medicines as told by your doctor. Make sure you finish them even if you start to feel better. °· Take supplements as told by your doctor. °· Consider wearing a medical alert bracelet. This tells anyone caring for you in an emergency of your condition. °· When traveling, keep your medical information, doctors' names, and the medicines you take with you at all times. °· If you have a fever, do not take fever medicines right away. This could cover up a problem. Tell your doctor. °·  Keep all follow-up visits with your doctor. Sickle cell anemia requires regular medical care. °GET HELP IF: °You have a fever. °GET HELP RIGHT AWAY IF: °· You feel dizzy or faint. °· You have new belly (abdominal) pain, especially on the left side near the stomach area. °· You have a lasting, often uncomfortable and painful erection of the penis (priapism). If it is not treated right away, you will become unable to have sex (impotence). °· You have numbness in your arms or legs or you have a hard time moving them. °· You have a hard time talking. °· You have a fever or lasting symptoms for more than 2-3 days. °· You have a fever and your symptoms suddenly get worse. °· You have signs or symptoms of infection.  These include: °¨ Chills. °¨ Being more tired than normal (lethargy). °¨ Irritability. °¨ Poor eating. °¨ Throwing up (vomiting). °· You have pain that is not helped with medicine. °· You have shortness of breath. °· You have pain in your chest. °· You are coughing up pus-like or bloody mucus. °· You have a stiff neck. °· Your feet or hands swell or have pain. °· Your belly looks bloated. °· Your joints hurt. °MAKE SURE YOU: °· Understand these instructions. °· Will watch your condition. °· Will get help right away if you are not doing well or get worse. °Document Released: 09/25/2013 Document Revised: 04/21/2014 Document Reviewed: 09/25/2013 °Exit[employer]® Patient Information ©2015 [employer]. This information is not intended to replace advice given to you by your health care provider. Make sure you discuss any questions you have with your health care provider. ° °

## 2014-08-06 ENCOUNTER — Emergency Department: Payer: Self-pay | Admitting: Emergency Medicine

## 2014-08-06 LAB — CBC WITH DIFFERENTIAL/PLATELET
Basophil #: 0 10*3/uL (ref 0.0–0.1)
Basophil %: 0.7 %
EOS ABS: 0.1 10*3/uL (ref 0.0–0.7)
Eosinophil %: 3.3 %
HCT: 26.4 % — AB (ref 40.0–52.0)
HGB: 8.5 g/dL — AB (ref 13.0–18.0)
LYMPHS ABS: 0.5 10*3/uL — AB (ref 1.0–3.6)
Lymphocyte %: 20.6 %
MCH: 30.5 pg (ref 26.0–34.0)
MCHC: 32.2 g/dL (ref 32.0–36.0)
MCV: 95 fL (ref 80–100)
MONO ABS: 0.3 x10 3/mm (ref 0.2–1.0)
Monocyte %: 10.4 %
NEUTROS ABS: 1.6 10*3/uL (ref 1.4–6.5)
NEUTROS PCT: 65 %
Platelet: 206 10*3/uL (ref 150–440)
RBC: 2.78 10*6/uL — ABNORMAL LOW (ref 4.40–5.90)
RDW: 16.5 % — ABNORMAL HIGH (ref 11.5–14.5)
WBC: 2.5 10*3/uL — AB (ref 3.8–10.6)

## 2014-08-06 LAB — COMPREHENSIVE METABOLIC PANEL
ALBUMIN: 3.1 g/dL — AB (ref 3.4–5.0)
ALT: 14 U/L
ANION GAP: 12 (ref 7–16)
Alkaline Phosphatase: 234 U/L — ABNORMAL HIGH
BUN: 73 mg/dL — AB (ref 7–18)
Bilirubin,Total: 0.5 mg/dL (ref 0.2–1.0)
CHLORIDE: 95 mmol/L — AB (ref 98–107)
CREATININE: 12.19 mg/dL — AB (ref 0.60–1.30)
Calcium, Total: 8.3 mg/dL — ABNORMAL LOW (ref 8.5–10.1)
Co2: 24 mmol/L (ref 21–32)
EGFR (African American): 6 — ABNORMAL LOW
GFR CALC NON AF AMER: 5 — AB
Glucose: 107 mg/dL — ABNORMAL HIGH (ref 65–99)
OSMOLALITY: 285 (ref 275–301)
POTASSIUM: 4.7 mmol/L (ref 3.5–5.1)
SGOT(AST): 24 U/L (ref 15–37)
Sodium: 131 mmol/L — ABNORMAL LOW (ref 136–145)
TOTAL PROTEIN: 9.5 g/dL — AB (ref 6.4–8.2)

## 2014-08-06 NOTE — ED Provider Notes (Signed)
This patient was seen in conjunction with the resident physician, Dr. Katrinka BlazingSmith.  The documentation accurately reflects the patient's ED evaluation.  On my exam, patient was in no distress, sitting upright, awake, alert, speaking clearly. The patient's multiple medical problems, including HIV, end-stage renal disease, sickle cell disease. Patient's evaluation here is largely reassuring him and his pain resolved. Patient was discharged to followup with his primary care team.  Gerhard Munchobert Devron Cohick, MD 08/06/14 [phone]

## 2014-08-08 ENCOUNTER — Emergency Department: Payer: Self-pay | Admitting: Emergency Medicine

## 2014-08-08 ENCOUNTER — Encounter (HOSPITAL_COMMUNITY): Payer: Self-pay | Admitting: Emergency Medicine

## 2014-08-08 ENCOUNTER — Emergency Department (HOSPITAL_COMMUNITY)
Admission: EM | Admit: 2014-08-08 | Discharge: 2014-08-08 | Disposition: A | Discharge status: Home / Self Care | Payer: Medicare Other | Attending: Emergency Medicine | Admitting: Emergency Medicine

## 2014-08-08 DIAGNOSIS — I120 Hypertensive chronic kidney disease with stage 5 chronic kidney disease or end stage renal disease: Secondary | ICD-10-CM | POA: Diagnosis not present

## 2014-08-08 DIAGNOSIS — M25559 Pain in unspecified hip: Secondary | ICD-10-CM | POA: Diagnosis not present

## 2014-08-08 DIAGNOSIS — Z992 Dependence on renal dialysis: Secondary | ICD-10-CM | POA: Diagnosis not present

## 2014-08-08 DIAGNOSIS — M25569 Pain in unspecified knee: Secondary | ICD-10-CM | POA: Insufficient documentation

## 2014-08-08 DIAGNOSIS — M545 Low back pain, unspecified: Secondary | ICD-10-CM | POA: Diagnosis not present

## 2014-08-08 DIAGNOSIS — R112 Nausea with vomiting, unspecified: Secondary | ICD-10-CM | POA: Insufficient documentation

## 2014-08-08 DIAGNOSIS — F17200 Nicotine dependence, unspecified, uncomplicated: Secondary | ICD-10-CM | POA: Diagnosis not present

## 2014-08-08 DIAGNOSIS — N186 End stage renal disease: Secondary | ICD-10-CM | POA: Insufficient documentation

## 2014-08-08 DIAGNOSIS — Z21 Asymptomatic human immunodeficiency virus [HIV] infection status: Secondary | ICD-10-CM | POA: Diagnosis not present

## 2014-08-08 DIAGNOSIS — D5700 Hb-SS disease with crisis, unspecified: Secondary | ICD-10-CM | POA: Insufficient documentation

## 2014-08-08 DIAGNOSIS — Z765 Malingerer [conscious simulation]: Secondary | ICD-10-CM | POA: Insufficient documentation

## 2014-08-08 DIAGNOSIS — Z79899 Other long term (current) drug therapy: Secondary | ICD-10-CM | POA: Insufficient documentation

## 2014-08-08 DIAGNOSIS — M2550 Pain in unspecified joint: Secondary | ICD-10-CM

## 2014-08-08 LAB — RETICULOCYTES
ABSOLUTE RETIC COUNT: 0.061 10*6/uL (ref 0.019–0.186)
Reticulocyte: 2.2 % (ref 0.4–3.1)

## 2014-08-08 LAB — COMPREHENSIVE METABOLIC PANEL
ANION GAP: 12 (ref 7–16)
AST: 39 U/L — AB (ref 15–37)
Albumin: 2.8 g/dL — ABNORMAL LOW (ref 3.4–5.0)
Alkaline Phosphatase: 216 U/L — ABNORMAL HIGH
BILIRUBIN TOTAL: 0.5 mg/dL (ref 0.2–1.0)
BUN: 33 mg/dL — ABNORMAL HIGH (ref 7–18)
CALCIUM: 8.6 mg/dL (ref 8.5–10.1)
CO2: 28 mmol/L (ref 21–32)
Chloride: 95 mmol/L — ABNORMAL LOW (ref 98–107)
Creatinine: 7.86 mg/dL — ABNORMAL HIGH (ref 0.60–1.30)
EGFR (Non-African Amer.): 8 — ABNORMAL LOW
GFR CALC AF AMER: 9 — AB
Glucose: 103 mg/dL — ABNORMAL HIGH (ref 65–99)
OSMOLALITY: 278 (ref 275–301)
Potassium: 4.5 mmol/L (ref 3.5–5.1)
SGPT (ALT): 17 U/L
SODIUM: 135 mmol/L — AB (ref 136–145)
TOTAL PROTEIN: 9.5 g/dL — AB (ref 6.4–8.2)

## 2014-08-08 LAB — CBC
HCT: 26 % — AB (ref 40.0–52.0)
HGB: 8.2 g/dL — ABNORMAL LOW (ref 13.0–18.0)
MCH: 30.1 pg (ref 26.0–34.0)
MCHC: 31.6 g/dL — AB (ref 32.0–36.0)
MCV: 95 fL (ref 80–100)
Platelet: 141 10*3/uL — ABNORMAL LOW (ref 150–440)
RBC: 2.73 10*6/uL — AB (ref 4.40–5.90)
RDW: 16.8 % — ABNORMAL HIGH (ref 11.5–14.5)
WBC: 2.9 10*3/uL — ABNORMAL LOW (ref 3.8–10.6)

## 2014-08-08 MED ORDER — PROMETHAZINE HCL 25 MG/ML IJ SOLN
25.0000 mg | Freq: Once | INTRAMUSCULAR | Status: DC
  Filled 2014-08-08: qty 1

## 2014-08-08 NOTE — ED Provider Notes (Signed)
CSN: [id number]     Arrival date & time 08/08/14  0024 History   First MD Initiated Contact with Patient 08/08/14 0249     Chief Complaint  Patient presents with  . Sickle Cell Pain Crisis  . Emesis     (Consider location/radiation/quality/duration/timing/severity/associated sxs/prior Treatment) HPI 33 year old male presents to emergency department with complaint of diffuse joint pain that includes his knees hips and lower back.  He also complains of nausea and vomiting.  He reports he's been unable to keep down any of his medications.  Patient has history of hypertension, end-stage renal disease on dialysis, HIV.  Patient reports that he does at home dialysis Monday Wednesday Friday.  Patient reports that he is followed by Dr. Raford PitcherBarrett who treats him for his pain renal disease and sickle cell.  He reports that he has been unable to keep down his Dilaudid tablets.  He denies any fever or chills Past Medical History  Diagnosis Date  . Hypertension   . Sickle cell anemia   . ESRD on hemodialysis   . HIV disease   . Drug-seeking behavior    Past Surgical History  Procedure Laterality Date  . Hip surgery     No family history on file. History  Substance Use Topics  . Smoking status: Current Every Day Smoker  . Smokeless tobacco: Not on file  . Alcohol Use: No    Review of Systems   See History of Present Illness; otherwise all other systems are reviewed and negative  Allergies  Review of patient's allergies indicates no known allergies.  Home Medications   Prior to Admission medications   Medication Sig Start Date End Date Taking? Authorizing Provider  amLODipine (NORVASC) 5 MG tablet Take 5 mg by mouth daily.   Yes Historical Provider, MD  b complex-vitamin c-folic acid (NEPHRO-VITE) 0.8 MG TABS tablet Take 1 tablet by mouth at bedtime.   Yes Historical Provider, MD  folic acid (FOLVITE) 1 MG tablet Take 1 mg by mouth daily.   Yes Historical Provider, MD  HYDROmorphone  (DILAUDID) 8 MG tablet Take 16 mg by mouth every 4 (four) hours as needed for severe pain.   Yes Historical Provider, MD  hydroxyurea (HYDREA) 500 MG capsule Take 500 mg by mouth 2 (two) times daily. May take with food to minimize GI side effects.   Yes Historical Provider, MD  lisinopril (PRINIVIL,ZESTRIL) 10 MG tablet Take 10 mg by mouth 2 (two) times daily.   Yes Historical Provider, MD   BP 175/83  Pulse 88  Temp(Src) 99 F (37.2 C) (Oral)  Resp 20  SpO2 98% Physical Exam  Constitutional: He is oriented to person, place, and time.  Chronically ill-appearing male in no acute distress.  No vomiting noted  HENT:  Head: Normocephalic and atraumatic.  Nose: Nose normal.  Mouth/Throat: Oropharynx is clear and moist.  Neck: Normal range of motion. Neck supple. No JVD present. No tracheal deviation present. No thyromegaly present.  Cardiovascular: Normal rate, regular rhythm, normal heart sounds and intact distal pulses.   Pulmonary/Chest: Effort normal and breath sounds normal. No stridor. No respiratory distress. He has no wheezes. He has no rales. He exhibits no tenderness.  Lymphadenopathy:    He has no cervical adenopathy.  Neurological: He is alert and oriented to person, place, and time.  Skin: Skin is warm and dry. No rash noted. No erythema. No pallor.  Psychiatric: He has a normal mood and affect.    ED Course  Procedures (including  critical care time) Labs Review Labs Reviewed  CBC WITH DIFFERENTIAL  BASIC METABOLIC PANEL    Imaging Review No results found.   EKG Interpretation None      MDM   Final diagnoses:  Joint pain  Malingering    Patient reports history of sickle cell disease, however in looking through care everywhere, patient had sickle cell screen done in April and in May of this past year they were both negative.  It specifically states in the pathology report that his sample is not consistent with sickle cell disease.  Patient was seen in the  emergency apartment at [facility] twice this past week both times with concerns for drug-seeking behavior.  I informed the patient that I would give him IM Phenergan to help with his reported nausea and vomiting.  No nausea or vomiting seen during his ED stay.  I told him that once his nausea and vomiting were improved he can take his home dose of the long head.  I also for the patient we will be checking his labs as he is an end-stage renal patient to verify that his electrodes were doing well.  [place] controlled substance reporting system queried, there is no record of patient with his name and date of birth receiving controlled substances in the last 6 months.  Patient reports that he would like to leave.  Since he is not in extremis, does not appear acutely unwell, we'll discharge him home.   Olivia Mackie, MD 08/08/14 [phone]

## 2014-08-08 NOTE — ED Notes (Signed)
Patient refused lab draw at this time states " I am a hard stick and would like to wait until I get into a room."

## 2014-08-08 NOTE — Discharge Instructions (Signed)
Please follow up with your doctor for further management of your pain and nausea.   Arthralgia Your caregiver has diagnosed you as suffering from an arthralgia. Arthralgia means there is pain in a joint. This can come from many reasons including:  Bruising the joint which causes soreness (inflammation) in the joint.  Wear and tear on the joints which occur as we grow older (osteoarthritis).  Overusing the joint.  Various forms of arthritis.  Infections of the joint. Regardless of the cause of pain in your joint, most of these different pains respond to anti-inflammatory drugs and rest. The exception to this is when a joint is infected, and these cases are treated with antibiotics, if it is a bacterial infection. HOME CARE INSTRUCTIONS   Rest the injured area for as long as directed by your caregiver. Then slowly start using the joint as directed by your caregiver and as the pain allows. Crutches as directed may be useful if the ankles, knees or hips are involved. If the knee was splinted or casted, continue use and care as directed. If an stretchy or elastic wrapping bandage has been applied today, it should be removed and re-applied every 3 to 4 hours. It should not be applied tightly, but firmly enough to keep swelling down. Watch toes and feet for swelling, bluish discoloration, coldness, numbness or excessive pain. If any of these problems (symptoms) occur, remove the ace bandage and re-apply more loosely. If these symptoms persist, contact your caregiver or return to this location.  For the first 24 hours, keep the injured extremity elevated on pillows while lying down.  Apply ice for 15-20 minutes to the sore joint every couple hours while awake for the first half day. Then 03-04 times per day for the first 48 hours. Put the ice in a plastic bag and place a towel between the bag of ice and your skin.  Wear any splinting, casting, elastic bandage applications, or slings as  instructed.  Only take over-the-counter or prescription medicines for pain, discomfort, or fever as directed by your caregiver. Do not use aspirin immediately after the injury unless instructed by your physician. Aspirin can cause increased bleeding and bruising of the tissues.  If you were given crutches, continue to use them as instructed and do not resume weight bearing on the sore joint until instructed. Persistent pain and inability to use the sore joint as directed for more than 2 to 3 days are warning signs indicating that you should see a caregiver for a follow-up visit as soon as possible. Initially, a hairline fracture (break in bone) may not be evident on X-rays. Persistent pain and swelling indicate that further evaluation, non-weight bearing or use of the joint (use of crutches or slings as instructed), or further X-rays are indicated. X-rays may sometimes not show a small fracture until a week or 10 days later. Make a follow-up appointment with your own caregiver or one to whom we have referred you. A radiologist (specialist in reading X-rays) may read your X-rays. Make sure you know how you are to obtain your X-ray results. Do not assume everything is normal if you do not hear from us. SEEK MEDICAL CARE IF: Bruising, swelling, or pain increases. SEEK IMMEDIATE MEDICAL CARE IF:   Your fingers or toes are numb or blue.  The pain is not responding to medications and continues to stay the same or get worse.  The pain in your joint becomes severe.  You develop a fever over 102 F (  38.9 C).  It becomes impossible to move or use the joint. MAKE SURE YOU:   Understand these instructions.  Will watch your condition.  Will get help right away if you are not doing well or get worse. Document Released: 12/05/2005 Document Revised: 02/27/2012 Document Reviewed: 07/23/2008 [facility] Patient Information 2015 [place], [place]. This information is not intended to replace advice given to you  by your health care provider. Make sure you discuss any questions you have with your health care provider.

## 2014-08-08 NOTE — ED Notes (Signed)
Pt presents with c/o sickle cell pain and N/V. Pt says the symptoms started approx 2 days ago. Pt is in NAD at this time, c/o pain in his joints and back.

## 2014-08-08 NOTE — ED Notes (Signed)
Patient in NAD at time of signature.

## 2014-08-08 NOTE — ED Notes (Signed)
Patient wishes to leave. Dr. Norlene Campbelltter at bedside and aware of same. Patient to sign AMA.

## 2014-08-18 ENCOUNTER — Emergency Department: Payer: Self-pay | Admitting: Emergency Medicine

## 2014-08-18 LAB — COMPREHENSIVE METABOLIC PANEL
ALBUMIN: 2.8 g/dL — AB (ref 3.4–5.0)
AST: 39 U/L — AB (ref 15–37)
Alkaline Phosphatase: 192 U/L — ABNORMAL HIGH
Anion Gap: 11 (ref 7–16)
BUN: 64 mg/dL — ABNORMAL HIGH (ref 7–18)
Bilirubin,Total: 0.5 mg/dL (ref 0.2–1.0)
CREATININE: 12.06 mg/dL — AB (ref 0.60–1.30)
Calcium, Total: 8.4 mg/dL — ABNORMAL LOW (ref 8.5–10.1)
Chloride: 96 mmol/L — ABNORMAL LOW (ref 98–107)
Co2: 26 mmol/L (ref 21–32)
EGFR (Non-African Amer.): 5 — ABNORMAL LOW
GFR CALC AF AMER: 6 — AB
Glucose: 157 mg/dL — ABNORMAL HIGH (ref 65–99)
OSMOLALITY: 288 (ref 275–301)
Potassium: 5.1 mmol/L (ref 3.5–5.1)
SGPT (ALT): 13 U/L — ABNORMAL LOW
Sodium: 133 mmol/L — ABNORMAL LOW (ref 136–145)
TOTAL PROTEIN: 9.5 g/dL — AB (ref 6.4–8.2)

## 2014-08-18 LAB — CBC
HCT: 25.9 % — ABNORMAL LOW (ref 40.0–52.0)
HGB: 8.1 g/dL — ABNORMAL LOW (ref 13.0–18.0)
MCH: 29.8 pg (ref 26.0–34.0)
MCHC: 31.4 g/dL — ABNORMAL LOW (ref 32.0–36.0)
MCV: 95 fL (ref 80–100)
Platelet: 84 10*3/uL — ABNORMAL LOW (ref 150–440)
RBC: 2.72 10*6/uL — ABNORMAL LOW (ref 4.40–5.90)
RDW: 16 % — ABNORMAL HIGH (ref 11.5–14.5)
WBC: 2.2 10*3/uL — ABNORMAL LOW (ref 3.8–10.6)

## 2014-08-18 LAB — RETICULOCYTES
Absolute Retic Count: 0.0178 10*6/uL — ABNORMAL LOW (ref 0.019–0.186)
Reticulocyte: 0.65 % (ref 0.4–3.1)

## 2014-08-20 ENCOUNTER — Emergency Department (HOSPITAL_COMMUNITY)
Admission: EM | Admit: 2014-08-20 | Discharge: 2014-08-20 | Disposition: A | Discharge status: Home / Self Care | Payer: Medicare Other | Attending: Emergency Medicine | Admitting: Emergency Medicine

## 2014-08-20 ENCOUNTER — Encounter (HOSPITAL_COMMUNITY): Payer: Self-pay | Admitting: Emergency Medicine

## 2014-08-20 DIAGNOSIS — N186 End stage renal disease: Secondary | ICD-10-CM | POA: Diagnosis not present

## 2014-08-20 DIAGNOSIS — F17200 Nicotine dependence, unspecified, uncomplicated: Secondary | ICD-10-CM | POA: Insufficient documentation

## 2014-08-20 DIAGNOSIS — Z79899 Other long term (current) drug therapy: Secondary | ICD-10-CM | POA: Insufficient documentation

## 2014-08-20 DIAGNOSIS — I120 Hypertensive chronic kidney disease with stage 5 chronic kidney disease or end stage renal disease: Secondary | ICD-10-CM | POA: Diagnosis not present

## 2014-08-20 DIAGNOSIS — Z8719 Personal history of other diseases of the digestive system: Secondary | ICD-10-CM | POA: Diagnosis not present

## 2014-08-20 DIAGNOSIS — R000 Tachycardia, unspecified: Secondary | ICD-10-CM | POA: Diagnosis not present

## 2014-08-20 DIAGNOSIS — R1013 Epigastric pain: Secondary | ICD-10-CM | POA: Insufficient documentation

## 2014-08-20 DIAGNOSIS — Z21 Asymptomatic human immunodeficiency virus [HIV] infection status: Secondary | ICD-10-CM | POA: Diagnosis not present

## 2014-08-20 DIAGNOSIS — D5700 Hb-SS disease with crisis, unspecified: Secondary | ICD-10-CM | POA: Diagnosis present

## 2014-08-20 DIAGNOSIS — R112 Nausea with vomiting, unspecified: Secondary | ICD-10-CM | POA: Diagnosis not present

## 2014-08-20 DIAGNOSIS — Z992 Dependence on renal dialysis: Secondary | ICD-10-CM | POA: Diagnosis not present

## 2014-08-20 DIAGNOSIS — E119 Type 2 diabetes mellitus without complications: Secondary | ICD-10-CM | POA: Diagnosis not present

## 2014-08-20 LAB — CBC WITH DIFFERENTIAL/PLATELET
BASOS PCT: 1 % (ref 0–1)
Basophils Absolute: 0 10*3/uL (ref 0.0–0.1)
EOS ABS: 0 10*3/uL (ref 0.0–0.7)
Eosinophils Relative: 2 % (ref 0–5)
HEMATOCRIT: 27.7 % — AB (ref 39.0–52.0)
HEMOGLOBIN: 8.9 g/dL — AB (ref 13.0–17.0)
Lymphocytes Relative: 25 % (ref 12–46)
Lymphs Abs: 0.7 10*3/uL (ref 0.7–4.0)
MCH: 29.9 pg (ref 26.0–34.0)
MCHC: 32.1 g/dL (ref 30.0–36.0)
MCV: 93 fL (ref 78.0–100.0)
MONOS PCT: 9 % (ref 3–12)
Monocytes Absolute: 0.3 10*3/uL (ref 0.1–1.0)
Neutro Abs: 1.7 10*3/uL (ref 1.7–7.7)
Neutrophils Relative %: 63 % (ref 43–77)
Platelets: 104 10*3/uL — ABNORMAL LOW (ref 150–400)
RBC: 2.98 MIL/uL — ABNORMAL LOW (ref 4.22–5.81)
RDW: 15.3 % (ref 11.5–15.5)
WBC: 2.7 10*3/uL — ABNORMAL LOW (ref 4.0–10.5)

## 2014-08-20 LAB — COMPREHENSIVE METABOLIC PANEL
ALK PHOS: 170 U/L — AB (ref 39–117)
ALT: 11 U/L (ref 0–53)
AST: 24 U/L (ref 0–37)
Albumin: 3.1 g/dL — ABNORMAL LOW (ref 3.5–5.2)
Anion gap: 16 — ABNORMAL HIGH (ref 5–15)
BUN: 36 mg/dL — AB (ref 6–23)
CHLORIDE: 90 meq/L — AB (ref 96–112)
CO2: 28 mEq/L (ref 19–32)
CREATININE: 7.54 mg/dL — AB (ref 0.50–1.35)
Calcium: 8.5 mg/dL (ref 8.4–10.5)
GFR calc Af Amer: 10 mL/min — ABNORMAL LOW (ref >90)
GFR, EST NON AFRICAN AMERICAN: 8 mL/min — AB (ref >90)
Glucose, Bld: 104 mg/dL — ABNORMAL HIGH (ref 70–99)
Potassium: 4.6 mEq/L (ref 3.7–5.3)
Sodium: 134 mEq/L — ABNORMAL LOW (ref 137–147)
TOTAL PROTEIN: 9.7 g/dL — AB (ref 6.0–8.3)
Total Bilirubin: 0.6 mg/dL (ref 0.3–1.2)

## 2014-08-20 LAB — RETICULOCYTES
RBC.: 2.98 MIL/uL — AB (ref 4.22–5.81)
RETIC COUNT ABSOLUTE: 23.8 10*3/uL (ref 19.0–186.0)
Retic Ct Pct: 0.8 % (ref 0.4–3.1)

## 2014-08-20 LAB — LIPASE, BLOOD: Lipase: 69 U/L — ABNORMAL HIGH (ref 11–59)

## 2014-08-20 MED ORDER — DIPHENHYDRAMINE HCL 50 MG/ML IJ SOLN
25.0000 mg | Freq: Once | INTRAMUSCULAR | Status: AC
  Administered 2014-08-20: 25 mg via INTRAVENOUS
  Filled 2014-08-20: qty 1

## 2014-08-20 MED ORDER — ONDANSETRON 4 MG PO TBDP: ORAL_TABLET | ORAL | Status: DC

## 2014-08-20 MED ORDER — PROMETHAZINE HCL 25 MG/ML IJ SOLN
25.0000 mg | Freq: Once | INTRAMUSCULAR | Status: AC
Start: 2014-08-20 — End: 2014-08-20
  Administered 2014-08-20: 25 mg via INTRAVENOUS
  Filled 2014-08-20: qty 1

## 2014-08-20 MED ORDER — HYDROMORPHONE HCL PF 1 MG/ML IJ SOLN
2.0000 mg | Freq: Once | INTRAMUSCULAR | Status: AC
  Administered 2014-08-20: 2 mg via INTRAVENOUS
  Filled 2014-08-20: qty 2

## 2014-08-20 MED ORDER — ONDANSETRON HCL 4 MG/2ML IJ SOLN
4.0000 mg | Freq: Once | INTRAMUSCULAR | Status: AC
  Administered 2014-08-20: 4 mg via INTRAVENOUS
  Filled 2014-08-20: qty 2

## 2014-08-20 MED ORDER — SODIUM CHLORIDE 0.9 % IV BOLUS (SEPSIS)
1000.0000 mL | Freq: Once | INTRAVENOUS | Status: AC
  Administered 2014-08-20: 1000 mL via INTRAVENOUS

## 2014-08-20 NOTE — ED Notes (Signed)
Pt presents to department for evaluation of sickle cell pain crisis. Pt states he is hurting all over body. Also reports nausea/vomiting this morning. 9/10 pain upon arrival. Pt is ambulatory without difficulty. Pt is alert and oriented x4.

## 2014-08-20 NOTE — ED Notes (Signed)
PA at bedside.

## 2014-08-20 NOTE — ED Provider Notes (Signed)
CSN: [id number]     Arrival date & time 08/20/14  0758 History   First MD Initiated Contact with Patient 08/20/14 0759     Chief Complaint  Patient presents with  . Sickle Cell Pain Crisis  . Emesis     (Consider location/radiation/quality/duration/timing/severity/associated sxs/prior Treatment) HPI Comments: Patient is a 33 year old male with a past medical history of sickle cell anemia, HIV, end-stage renal disease on dialysis (T, Th, Sat), hypertension and pancreatitis who presents to the emergency department complaining of an exacerbation of sickle cell pain x2 days. Patient reports her pain gradually increase from yesterday into today. States he has lower extremity joint pain and low back pain which is typical of his sickle cell pain crisis. Pain currently rated 9/10, unrelieved by his home medications. Admits to associated nausea and multiple episodes of nonbloody, nonbilious emesis beginning yesterday during dialysis. He was unable to complete his full dialysis, however states they were able to draw a good amount of fluid off. Denies chest pain, shortness of breath, abdominal pain, fever or chills. He does not make urine.  Patient is a 33 y.o. male presenting with sickle cell pain and vomiting. The history is provided by the patient.  Sickle Cell Pain Crisis Associated symptoms: nausea and vomiting   Emesis   Past Medical History  Diagnosis Date  . DM (diabetes mellitus)   . Sickle cell anemia     States Dx at 33 yo, s/p multiple prior transfusions, typically with pain in legs, back  . HIV (human immunodeficiency virus infection) 2003    Followed by Harless Nakayama at [facility], [facility]   . End stage renal disease on dialysis HD in 2009    Gets HD at home (?). Followed by Dr. Jack Quarto (sp?) in [place]. States due to HTN/DM. HD on T / Th / Sat  . HTN (hypertension)   . Pulmonary mass     Unclear history. States PCP was following a pulmonary mass with serial imaging  . Pancreatitis  2012    Denies any alcohol use   . Hypertension   . Diabetes mellitus   . Sickle cell anemia   . Renal failure   . Renal failure   . HIV (human immunodeficiency virus infection)   . Blood transfusion    Past Surgical History  Procedure Laterality Date  . Dialysis fistula creation    . Hip surgery      "screw placed into joint"  . Av fistula placement     Family History  Problem Relation Age of Onset  . Hypertension Father   . Diabetes      Uncles and grandfather  . Cervical cancer Mother     Dec 46yo of cervical ca   History  Substance Use Topics  . Smoking status: Current Every Day Smoker -- 0.25 packs/day for 10 years    Types: Cigarettes  . Smokeless tobacco: Never Used  . Alcohol Use: No    Review of Systems  Gastrointestinal: Positive for nausea and vomiting.  Musculoskeletal: Positive for back pain.       + lower extremity joint pain.  All other systems reviewed and are negative.     Allergies  Review of patient's allergies indicates no known allergies.  Home Medications   Prior to Admission medications   Medication Sig Start Date End Date Taking? Authorizing Provider  b complex-vitamin c-folic acid (NEPHRO-VITE) 0.8 MG TABS tablet Take 1 tablet by mouth at bedtime.   Yes Historical Provider, MD  calcium acetate (PHOSLO) 667 MG capsule Take 2,001 mg by mouth 3 (three) times daily with meals.   Yes Historical Provider, MD  diphenhydrAMINE (BENADRYL) 50 MG capsule Take 50 mg by mouth every 6 (six) hours as needed for itching or allergies.   Yes Historical Provider, MD  folic acid (FOLVITE) 1 MG tablet Take 1 mg by mouth daily.   Yes Historical Provider, MD  HYDROmorphone (DILAUDID) 8 MG tablet Take 8 mg by mouth every 4 (four) hours as needed for severe pain.   Yes Historical Provider, MD  hydroxyurea (HYDREA) 500 MG capsule Take 500 mg by mouth 2 (two) times daily. May take with food to minimize GI side effects.   Yes Historical Provider, MD  lisinopril  (PRINIVIL,ZESTRIL) 10 MG tablet Take 10 mg by mouth daily.   Yes Historical Provider, MD  ondansetron (ZOFRAN-ODT) 4 MG disintegrating tablet Take 4 mg by mouth every 8 (eight) hours as needed for nausea or vomiting.   Yes Historical Provider, MD  promethazine (PHENERGAN) 50 MG tablet Take 50 mg by mouth every 6 (six) hours as needed for nausea or vomiting.   Yes Historical Provider, MD  ondansetron (ZOFRAN ODT) 4 MG disintegrating tablet  ODT q4 hours prn nausea/vomit 08/20/14   Florence Antonelli M Albert, PA-C   BP 161/87  Pulse 113  Temp(Src) 98.7 F (37.1 C) (Oral)  Resp 12  SpO2 93% Physical Exam  Nursing note and vitals reviewed. Constitutional: He is oriented to person, place, and time. He appears well-developed and well-nourished. No distress.  HENT:  Head: Normocephalic and atraumatic.  Mouth/Throat: Oropharynx is clear and moist.  Eyes: Conjunctivae are normal.  Neck: Normal range of motion. Neck supple. No spinous process tenderness and no muscular tenderness present.  Cardiovascular: Regular rhythm and normal heart sounds.   Tachycardia.  Pulmonary/Chest: Effort normal and breath sounds normal. No respiratory distress.  Abdominal: Soft. Bowel sounds are normal. There is tenderness. There is no rebound and no guarding.  Mild epigastric tenderness. No peritoneal signs.  Musculoskeletal: Normal range of motion. He exhibits no edema and no tenderness.  Lumbar spine and lower extremities non-tender, FROM.  Neurological: He is alert and oriented to person, place, and time. He has normal strength.  Strength lower extremities 5/5 and equal bilateral. Sensation intact. Normal gait.  Skin: Skin is warm and dry. No rash noted. He is not diaphoretic.  Psychiatric: He has a normal mood and affect. His behavior is normal.    ED Course  Procedures (including critical care time) Labs Review Labs Reviewed  CBC WITH DIFFERENTIAL - Abnormal; Notable for the following:    WBC 2.7 (*)    RBC  2.98 (*)    Hemoglobin 8.9 (*)    HCT 27.7 (*)    Platelets 104 (*)    All other components within normal limits  COMPREHENSIVE METABOLIC PANEL - Abnormal; Notable for the following:    Sodium 134 (*)    Chloride 90 (*)    Glucose, Bld 104 (*)    BUN 36 (*)    Creatinine, Ser 7.54 (*)    Total Protein 9.7 (*)    Albumin 3.1 (*)    Alkaline Phosphatase 170 (*)    GFR calc non Af Amer 8 (*)    GFR calc Af Amer 10 (*)    Anion gap 16 (*)    All other components within normal limits  LIPASE, BLOOD - Abnormal; Notable for the following:    Lipase 69 (*)  All other components within normal limits  RETICULOCYTES - Abnormal; Notable for the following:    RBC. 2.98 (*)    All other components within normal limits    Imaging Review No results found.   EKG Interpretation None      MDM   Final diagnoses:  Sickle cell pain crisis   Patient presenting with sickle cell pain. He is nontoxic appearing and in no apparent distress. Afebrile, vital signs stable. Labs at baseline. Patient reports he feels better after 6 mg dilaudid. HR elevated, however on chart review he frequently has tachycardia. Stable for d/c. Return precautions given. Patient states understanding of treatment care plan and is agreeable.  Trevor Mace, PA-C 08/20/14 [address] PA-C 08/20/14 1123

## 2014-08-20 NOTE — Discharge Instructions (Signed)
Sickle Cell Anemia, Adult °Sickle cell anemia is a condition in which red blood cells have an abnormal "sickle" shape. This abnormal shape shortens the cells' life span, which results in a lower than normal concentration of red blood cells in the blood. The sickle shape also causes the cells to clump together and block free blood flow through the blood vessels. As a result, the tissues and organs of the body do not receive enough oxygen. Sickle cell anemia causes organ damage and pain and increases the risk of infection. °CAUSES  °Sickle cell anemia is a genetic disorder. Those who receive two copies of the gene have the condition, and those who receive one copy have the trait. °RISK FACTORS °The sickle cell gene is most common in people whose families originated in [place]. Other areas of the globe where sickle cell trait occurs include the Mediterranean, [place], the [place], and the [place].  °SIGNS AND SYMPTOMS °· Pain, especially in the extremities, back, chest, or abdomen (common). The pain may start suddenly or may develop following an illness, especially if there is dehydration. Pain can also occur due to overexertion or exposure to extreme temperature changes. °· Frequent severe bacterial infections, especially certain types of pneumonia and meningitis. °· Pain and swelling in the hands and feet. °· Decreased activity.   °· Loss of appetite.   °· Change in behavior. °· Headaches. °· Seizures. °· Shortness of breath or difficulty breathing. °· Vision changes. °· Skin ulcers. °Those with the trait may not have symptoms or they may have mild symptoms.  °DIAGNOSIS  °Sickle cell anemia is diagnosed with blood tests that demonstrate the genetic trait. It is often diagnosed during the newborn period, due to mandatory testing nationwide. A variety of blood tests, X-rays, CT scans, MRI scans, ultrasounds, and lung function tests may also be done to monitor the condition. °TREATMENT  °Sickle  cell anemia may be treated with: °· Medicines. You may be given pain medicines, antibiotic medicines (to treat and prevent infections) or medicines to increase the production of certain types of hemoglobin. °· Fluids. °· Oxygen. °· Blood transfusions. °HOME CARE INSTRUCTIONS  °· Drink enough fluid to keep your urine clear or pale yellow. Increase your fluid intake in hot weather and during exercise. °· Do not smoke. Smoking lowers oxygen levels in the blood.   °· Only take over-the-counter or prescription medicines for pain, fever, or discomfort as directed by your health care provider. °· Take antibiotics as directed by your health care provider. Make sure you finish them it even if you start to feel better.   °· Take supplements as directed by your health care provider.   °· Consider wearing a medical alert bracelet. This tells anyone caring for you in an emergency of your condition.   °· When traveling, keep your medical information, health care provider's names, and the medicines you take with you at all times.   °· If you develop a fever, do not take medicines to reduce the fever right away. This could cover up a problem that is developing. Notify your health care provider. °· Keep all follow-up appointments with your health care provider. Sickle cell anemia requires regular medical care. °SEEK MEDICAL CARE IF: ° You have a fever. °SEEK IMMEDIATE MEDICAL CARE IF:  °· You feel dizzy or faint.   °· You have new abdominal pain, especially on the left side near the stomach area.   °· You develop a persistent, often uncomfortable and painful penile erection (priapism). If this is not treated immediately it   will lead to impotence.   °· You have numbness your arms or legs or you have a hard time moving them.   °· You have a hard time with speech.   °· You have a fever or persistent symptoms for more than 2-3 days.   °· You have a fever and your symptoms suddenly get worse.   °· You have signs or symptoms of infection.  These include:   °¨ Chills.   °¨ Abnormal tiredness (lethargy).   °¨ Irritability.   °¨ Poor eating.   °¨ Vomiting.   °· You develop pain that is not helped with medicine.   °· You develop shortness of breath. °· You have pain in your chest.   °· You are coughing up pus-like or bloody sputum.   °· You develop a stiff neck. °· Your feet or hands swell or have pain. °· Your abdomen appears bloated. °· You develop joint pain. °MAKE SURE YOU: °· Understand these instructions. °Document Released: 03/15/2006 Document Revised: 04/21/2014 Document Reviewed: 07/17/2013 °[employer]® Patient Information ©2015 [employer]. This information is not intended to replace advice given to you by your health care provider. Make sure you discuss any questions you have with your health care provider. ° °

## 2014-08-20 NOTE — ED Notes (Signed)
Robin, PA, aware of pt HR. Pt ok to be discharged.

## 2014-08-20 NOTE — ED Notes (Signed)
Pt reports soreness and pain all over body, also states multiple episodes of nausea/vomiting this morning. Unable to get pain under control at home. 9/10 upon arrival to ED. Respirations unlabored. No signs of acute distress noted at present.

## 2014-08-20 NOTE — ED Notes (Signed)
Pt states 0/10 generalized pain all over body, no relief with pain medication, pt requesting to speak with PA. Vital signs stable. PA aware.

## 2014-08-22 NOTE — ED Provider Notes (Signed)
Medical screening examination/treatment/procedure(s) were performed by non-physician practitioner and as supervising physician I was immediately available for consultation/collaboration.   EKG Interpretation None        Mirian Mo, MD 08/22/14 [phone]

## 2014-08-23 ENCOUNTER — Emergency Department (HOSPITAL_COMMUNITY)
Admission: EM | Admit: 2014-08-23 | Discharge: 2014-08-23 | Disposition: A | Discharge status: Home / Self Care | Payer: Medicare Other | Attending: Emergency Medicine | Admitting: Emergency Medicine

## 2014-08-23 ENCOUNTER — Encounter (HOSPITAL_COMMUNITY): Payer: Self-pay | Admitting: Emergency Medicine

## 2014-08-23 DIAGNOSIS — R112 Nausea with vomiting, unspecified: Secondary | ICD-10-CM | POA: Insufficient documentation

## 2014-08-23 DIAGNOSIS — Z79899 Other long term (current) drug therapy: Secondary | ICD-10-CM | POA: Insufficient documentation

## 2014-08-23 DIAGNOSIS — F17200 Nicotine dependence, unspecified, uncomplicated: Secondary | ICD-10-CM | POA: Diagnosis not present

## 2014-08-23 DIAGNOSIS — I120 Hypertensive chronic kidney disease with stage 5 chronic kidney disease or end stage renal disease: Secondary | ICD-10-CM | POA: Insufficient documentation

## 2014-08-23 DIAGNOSIS — Z8719 Personal history of other diseases of the digestive system: Secondary | ICD-10-CM | POA: Insufficient documentation

## 2014-08-23 DIAGNOSIS — Z992 Dependence on renal dialysis: Secondary | ICD-10-CM | POA: Insufficient documentation

## 2014-08-23 DIAGNOSIS — R1031 Right lower quadrant pain: Secondary | ICD-10-CM | POA: Insufficient documentation

## 2014-08-23 DIAGNOSIS — N186 End stage renal disease: Secondary | ICD-10-CM | POA: Insufficient documentation

## 2014-08-23 DIAGNOSIS — D5700 Hb-SS disease with crisis, unspecified: Secondary | ICD-10-CM | POA: Diagnosis present

## 2014-08-23 DIAGNOSIS — Z21 Asymptomatic human immunodeficiency virus [HIV] infection status: Secondary | ICD-10-CM | POA: Insufficient documentation

## 2014-08-23 DIAGNOSIS — R1011 Right upper quadrant pain: Secondary | ICD-10-CM | POA: Insufficient documentation

## 2014-08-23 DIAGNOSIS — E119 Type 2 diabetes mellitus without complications: Secondary | ICD-10-CM | POA: Insufficient documentation

## 2014-08-23 LAB — COMPREHENSIVE METABOLIC PANEL WITH GFR
ALT: 8 U/L (ref 0–53)
AST: 16 U/L (ref 0–37)
Albumin: 2.8 g/dL — ABNORMAL LOW (ref 3.5–5.2)
Alkaline Phosphatase: 147 U/L — ABNORMAL HIGH (ref 39–117)
Anion gap: 20 — ABNORMAL HIGH (ref 5–15)
BUN: 44 mg/dL — ABNORMAL HIGH (ref 6–23)
CO2: 26 meq/L (ref 19–32)
Calcium: 8.1 mg/dL — ABNORMAL LOW (ref 8.4–10.5)
Chloride: 92 meq/L — ABNORMAL LOW (ref 96–112)
Creatinine, Ser: 8.19 mg/dL — ABNORMAL HIGH (ref 0.50–1.35)
GFR calc Af Amer: 9 mL/min — ABNORMAL LOW
GFR calc non Af Amer: 8 mL/min — ABNORMAL LOW
Glucose, Bld: 93 mg/dL (ref 70–99)
Potassium: 4.5 meq/L (ref 3.7–5.3)
Sodium: 138 meq/L (ref 137–147)
Total Bilirubin: 0.4 mg/dL (ref 0.3–1.2)
Total Protein: 9 g/dL — ABNORMAL HIGH (ref 6.0–8.3)

## 2014-08-23 LAB — CBC WITH DIFFERENTIAL/PLATELET
Basophils Absolute: 0 K/uL (ref 0.0–0.1)
Basophils Relative: 1 % (ref 0–1)
Eosinophils Absolute: 0.1 K/uL (ref 0.0–0.7)
Eosinophils Relative: 3 % (ref 0–5)
HCT: 25.2 % — ABNORMAL LOW (ref 39.0–52.0)
Hemoglobin: 7.9 g/dL — ABNORMAL LOW (ref 13.0–17.0)
Lymphocytes Relative: 18 % (ref 12–46)
Lymphs Abs: 0.5 K/uL — ABNORMAL LOW (ref 0.7–4.0)
MCH: 29.7 pg (ref 26.0–34.0)
MCHC: 31.3 g/dL (ref 30.0–36.0)
MCV: 94.7 fL (ref 78.0–100.0)
Monocytes Absolute: 0.2 K/uL (ref 0.1–1.0)
Monocytes Relative: 8 % (ref 3–12)
Neutro Abs: 2 K/uL (ref 1.7–7.7)
Neutrophils Relative %: 70 % (ref 43–77)
Platelets: 116 K/uL — ABNORMAL LOW (ref 150–400)
RBC: 2.66 MIL/uL — ABNORMAL LOW (ref 4.22–5.81)
RDW: 15.1 % (ref 11.5–15.5)
WBC: 2.8 K/uL — ABNORMAL LOW (ref 4.0–10.5)

## 2014-08-23 LAB — LIPASE, BLOOD: Lipase: 81 U/L — ABNORMAL HIGH (ref 11–59)

## 2014-08-23 MED ORDER — DIPHENHYDRAMINE HCL 50 MG/ML IJ SOLN
12.5000 mg | Freq: Once | INTRAMUSCULAR | Status: AC
  Administered 2014-08-23: 12.5 mg via INTRAVENOUS
  Filled 2014-08-23: qty 1

## 2014-08-23 MED ORDER — PROMETHAZINE HCL 25 MG/ML IJ SOLN
25.0000 mg | Freq: Once | INTRAMUSCULAR | Status: DC
  Filled 2014-08-23: qty 1

## 2014-08-23 MED ORDER — METOCLOPRAMIDE HCL 5 MG/ML IJ SOLN
10.0000 mg | Freq: Once | INTRAMUSCULAR | Status: DC
  Filled 2014-08-23: qty 2

## 2014-08-23 MED ORDER — ONDANSETRON HCL 4 MG/2ML IJ SOLN
4.0000 mg | Freq: Once | INTRAMUSCULAR | Status: AC
  Administered 2014-08-23: 4 mg via INTRAVENOUS
  Filled 2014-08-23: qty 2

## 2014-08-23 MED ORDER — HYDROMORPHONE HCL PF 1 MG/ML IJ SOLN
2.0000 mg | INTRAMUSCULAR | Status: AC
  Administered 2014-08-23: 2 mg via INTRAVENOUS
  Filled 2014-08-23: qty 2

## 2014-08-23 MED ORDER — HYDROMORPHONE HCL PF 1 MG/ML IJ SOLN
2.0000 mg | INTRAMUSCULAR | Status: DC | PRN
  Administered 2014-08-23 (×2): 2 mg via INTRAVENOUS
  Filled 2014-08-23 (×3): qty 2

## 2014-08-23 NOTE — ED Notes (Signed)
Brought pt to room; pt undressed, in gown, on monitor, continuous pulse oximetry and blood pressure cuff; 2 warm blankets given; Brett Canales, RN present in room

## 2014-08-23 NOTE — ED Notes (Signed)
Sickle cell patient, patient states that he is having joints and back pain that is similar to previous sickle cell pain crisis, denies chest pain and sob, also denies headache, dizziness, and vision changes

## 2014-08-23 NOTE — ED Notes (Signed)
Trixie Dredge, PA at bedside.  Patient sates that he has a had been feeling generalized joint pain and also has had some nausea with vomiting occasionally that is not out of the ordinary for him.  Pain started yesterday evening.

## 2014-08-23 NOTE — ED Provider Notes (Signed)
CSN: [id number]     Arrival date & time 08/23/14  0721 History   First MD Initiated Contact with Patient 08/23/14 [phone]     Chief Complaint  Patient presents with  . Sickle Cell Pain Crisis     (Consider location/radiation/quality/duration/timing/severity/associated sxs/prior Treatment) The history is provided by the patient.    Patient with hx sickle cell disease, HIV, end stage renal on dialysis (T,Th,Sa) p/w his typical sickle cell pain.  States his pain is in all of his joints and back..  He has also had N/V throughout the week, which he states is very typical for him, possibly from his chronic pancreatitis.  Has abdominal soreness, he thinks from vomiting.  Denies fevers, chest pain, SOB, cough, change in bowels.  Denies any infectious symptoms or recent illness.  He does not make urine.    Dialysis today at 11am at his home with an assistant.  States he is hoping he can get the pain under control and make it to his dialysis session.  Last dialysis was two days ago and he had a full session.  He is not out of any medications and is taking them as directed.    Past Medical History  Diagnosis Date  . DM (diabetes mellitus)   . Sickle cell anemia     States Dx at 33 yo, s/p multiple prior transfusions, typically with pain in legs, back  . HIV (human immunodeficiency virus infection) 2003    Followed by Harless Nakayama at [facility]   . End stage renal disease on dialysis HD in 2009    Gets HD at home (?). Followed by Dr. Jack Quarto (sp?) in [place]. States due to HTN/DM. HD on T / Th / Sat  . HTN (hypertension)   . Pulmonary mass     Unclear history. States PCP was following a pulmonary mass with serial imaging  . Pancreatitis 2012    Denies any alcohol use   . Hypertension   . Diabetes mellitus   . Sickle cell anemia   . Renal failure   . Renal failure   . HIV (human immunodeficiency virus infection)   . Blood transfusion    Past Surgical History  Procedure Laterality  Date  . Dialysis fistula creation    . Hip surgery      "screw placed into joint"  . Av fistula placement     Family History  Problem Relation Age of Onset  . Hypertension Father   . Diabetes      Uncles and grandfather  . Cervical cancer Mother     Dec 46yo of cervical ca   History  Substance Use Topics  . Smoking status: Current Every Day Smoker -- 0.25 packs/day for 10 years    Types: Cigarettes  . Smokeless tobacco: Never Used  . Alcohol Use: No    Review of Systems  All other systems reviewed and are negative.     Allergies  Review of patient's allergies indicates no known allergies.  Home Medications   Prior to Admission medications   Medication Sig Start Date End Date Taking? Authorizing Provider  b complex-vitamin c-folic acid (NEPHRO-VITE) 0.8 MG TABS tablet Take 1 tablet by mouth at bedtime.    Historical Provider, MD  calcium acetate (PHOSLO) 667 MG capsule Take 2,001 mg by mouth 3 (three) times daily with meals.    Historical Provider, MD  diphenhydrAMINE (BENADRYL) 50 MG capsule Take 50 mg by mouth every 6 (six) hours as needed for  itching or allergies.    Historical Provider, MD  folic acid (FOLVITE) 1 MG tablet Take 1 mg by mouth daily.    Historical Provider, MD  HYDROmorphone (DILAUDID) 8 MG tablet Take 8 mg by mouth every 4 (four) hours as needed for severe pain.    Historical Provider, MD  hydroxyurea (HYDREA) 500 MG capsule Take 500 mg by mouth 2 (two) times daily. May take with food to minimize GI side effects.    Historical Provider, MD  lisinopril (PRINIVIL,ZESTRIL) 10 MG tablet Take 10 mg by mouth daily.    Historical Provider, MD  ondansetron (ZOFRAN ODT) 4 MG disintegrating tablet  ODT q4 hours prn nausea/vomit 08/20/14   Trevor Mace, PA-C  ondansetron (ZOFRAN-ODT) 4 MG disintegrating tablet Take 4 mg by mouth every 8 (eight) hours as needed for nausea or vomiting.    Historical Provider, MD  promethazine (PHENERGAN) 50 MG tablet Take 50 mg by  mouth every 6 (six) hours as needed for nausea or vomiting.    Historical Provider, MD   BP 195/72  Pulse 73  Temp(Src) 98.2 F (36.8 C) (Oral)  Resp 22  Ht  (1.702 m)  Wt 180 lb (81.647 kg)  BMI 28.19 kg/m2  SpO2 100% Physical Exam  Nursing note and vitals reviewed. Constitutional: He appears well-developed and well-nourished. No distress.  HENT:  Head: Normocephalic and atraumatic.  Neck: Neck supple.  Cardiovascular: Normal rate and regular rhythm.   Pulmonary/Chest: Effort normal and breath sounds normal. No respiratory distress. He has no wheezes. He has no rales.  Abdominal: Soft. He exhibits no distension and no mass. There is tenderness in the right upper quadrant and right lower quadrant. There is no rebound and no guarding.  Neurological: He is alert. He exhibits normal muscle tone.  Skin: He is not diaphoretic.    ED Course  Procedures (including critical care time) Labs Review Labs Reviewed  CBC WITH DIFFERENTIAL - Abnormal; Notable for the following:    WBC 2.8 (*)    RBC 2.66 (*)    Hemoglobin 7.9 (*)    HCT 25.2 (*)    Platelets 116 (*)    Lymphs Abs 0.5 (*)    All other components within normal limits  COMPREHENSIVE METABOLIC PANEL - Abnormal; Notable for the following:    Chloride 92 (*)    BUN 44 (*)    Creatinine, Ser 8.19 (*)    Calcium 8.1 (*)    Total Protein 9.0 (*)    Albumin 2.8 (*)    Alkaline Phosphatase 147 (*)    GFR calc non Af Amer 8 (*)    GFR calc Af Amer 9 (*)    Anion gap 20 (*)    All other components within normal limits  LIPASE, BLOOD - Abnormal; Notable for the following:    Lipase 81 (*)    All other components within normal limits    Imaging Review No results found.   EKG Interpretation None      9:45 AM Pt reports he is feeling great improvement.  States that he knows himself very well and knows that with one more round of dilaudid, benadryl, and with phenergan he will be ready to go home.  Has someone waiting  for him to help with his dialysis.    MDM   Final diagnoses:  Sickle cell pain crisis  Non-intractable vomiting with nausea, vomiting of unspecified type  ESRD on dialysis    Pt with multiple medical problems  including sickle cell disease and ESRD on dialysis presents with his typical sickle cell pain.  Has been having N/V and abdominal soreness, but pt states this is very normal for him with his chronic pancreatitis.  Labs appear to be at baseline.  [name] pain controlled in ED.  Pt d/c at his request so that he could go to dialysis on time.  PCP, hematology follow up.  Discussed result, findings, treatment, and follow up  with patient.  Pt given return precautions.  Pt verbalizes understanding and agrees with plan.        Trixie Dredge, PA-C 08/23/14 1156

## 2014-08-23 NOTE — Discharge Instructions (Signed)
Read the information below.  You may return to the Emergency Department at any time for worsening condition or any new symptoms that concern you. If you develop high fevers, worsening abdominal pain, uncontrolled vomiting, or are unable to tolerate fluids by mouth, return to the ER for a recheck.     Nausea and Vomiting Nausea means you feel sick to your stomach. Throwing up (vomiting) is a reflex where stomach contents come out of your mouth. HOME CARE   Take medicine as told by your doctor.  Do not force yourself to eat. However, you do need to drink fluids.  If you feel like eating, eat a normal diet as told by your doctor.  Eat rice, wheat, potatoes, bread, lean meats, yogurt, fruits, and vegetables.  Avoid high-fat foods.  Drink enough fluids to keep your pee (urine) clear or pale yellow.  Ask your doctor how to replace body fluid losses (rehydrate). Signs of body fluid loss (dehydration) include:  Feeling very thirsty.  Dry lips and mouth.  Feeling dizzy.  Dark pee.  Peeing less than normal.  Feeling confused.  Fast breathing or heart rate. GET HELP RIGHT AWAY IF:   You have blood in your throw up.  You have black or bloody poop (stool).  You have a bad headache or stiff neck.  You feel confused.  You have bad belly (abdominal) pain.  You have chest pain or trouble breathing.  You do not pee at least once every 8 hours.  You have cold, clammy skin.  You keep throwing up after 24 to 48 hours.  You have a fever. MAKE SURE YOU:   Understand these instructions.  Will watch your condition.  Will get help right away if you are not doing well or get worse. Document Released: 05/23/2008 Document Revised: 02/27/2012 Document Reviewed: 05/06/2011 [facility] Patient Information 2015 [place], [place]. This information is not intended to replace advice given to you by your health care provider. Make sure you discuss any questions you have with your health care  provider.

## 2014-08-23 NOTE — ED Notes (Signed)
Pt in hurry to leave. Denied phenergan IV. Pt has ride waiting for him in the parking lot at this time.

## 2014-08-25 NOTE — ED Provider Notes (Signed)
Medical screening examination/treatment/procedure(s) were conducted as a shared visit with non-physician practitioner(s) and myself.  I personally evaluated the patient during the encounter.   EKG Interpretation None      I interviewed and examined the patient. Lungs are CTAB. Cardiac exam wnl. Abdomen soft w/ mild right sided abd ttp. Pt well appearing. Pain controlled, pt would like to go to HD. Plan for d/c w/ return precautions.   Purvis Sheffield, MD 08/25/14 1018

## 2014-08-26 ENCOUNTER — Encounter (HOSPITAL_COMMUNITY): Payer: Self-pay | Admitting: Emergency Medicine

## 2014-08-26 ENCOUNTER — Inpatient Hospital Stay (HOSPITAL_COMMUNITY)
Admission: EM | Admit: 2014-08-26 | Discharge: 2014-08-30 | DRG: 811 | Disposition: A | Discharge status: Home / Self Care | Payer: Medicare Other | Attending: Internal Medicine | Admitting: Internal Medicine

## 2014-08-26 DIAGNOSIS — M949 Disorder of cartilage, unspecified: Secondary | ICD-10-CM

## 2014-08-26 DIAGNOSIS — E119 Type 2 diabetes mellitus without complications: Secondary | ICD-10-CM | POA: Diagnosis present

## 2014-08-26 DIAGNOSIS — N186 End stage renal disease: Secondary | ICD-10-CM

## 2014-08-26 DIAGNOSIS — Z9119 Patient's noncompliance with other medical treatment and regimen: Secondary | ICD-10-CM | POA: Diagnosis not present

## 2014-08-26 DIAGNOSIS — D5700 Hb-SS disease with crisis, unspecified: Secondary | ICD-10-CM | POA: Diagnosis present

## 2014-08-26 DIAGNOSIS — Z992 Dependence on renal dialysis: Secondary | ICD-10-CM

## 2014-08-26 DIAGNOSIS — M899 Disorder of bone, unspecified: Secondary | ICD-10-CM | POA: Diagnosis present

## 2014-08-26 DIAGNOSIS — E875 Hyperkalemia: Secondary | ICD-10-CM | POA: Diagnosis present

## 2014-08-26 DIAGNOSIS — D61818 Other pancytopenia: Secondary | ICD-10-CM | POA: Diagnosis present

## 2014-08-26 DIAGNOSIS — I10 Essential (primary) hypertension: Secondary | ICD-10-CM | POA: Diagnosis not present

## 2014-08-26 DIAGNOSIS — B20 Human immunodeficiency virus [HIV] disease: Secondary | ICD-10-CM | POA: Diagnosis present

## 2014-08-26 DIAGNOSIS — Z21 Asymptomatic human immunodeficiency virus [HIV] infection status: Secondary | ICD-10-CM | POA: Diagnosis present

## 2014-08-26 DIAGNOSIS — K861 Other chronic pancreatitis: Secondary | ICD-10-CM | POA: Diagnosis present

## 2014-08-26 DIAGNOSIS — Z91199 Patient's noncompliance with other medical treatment and regimen due to unspecified reason: Secondary | ICD-10-CM

## 2014-08-26 DIAGNOSIS — F17200 Nicotine dependence, unspecified, uncomplicated: Secondary | ICD-10-CM | POA: Diagnosis present

## 2014-08-26 DIAGNOSIS — I120 Hypertensive chronic kidney disease with stage 5 chronic kidney disease or end stage renal disease: Secondary | ICD-10-CM | POA: Diagnosis present

## 2014-08-26 DIAGNOSIS — E089 Diabetes mellitus due to underlying condition without complications: Secondary | ICD-10-CM

## 2014-08-26 DIAGNOSIS — N2581 Secondary hyperparathyroidism of renal origin: Secondary | ICD-10-CM | POA: Diagnosis present

## 2014-08-26 DIAGNOSIS — R112 Nausea with vomiting, unspecified: Secondary | ICD-10-CM

## 2014-08-26 DIAGNOSIS — D571 Sickle-cell disease without crisis: Secondary | ICD-10-CM | POA: Diagnosis present

## 2014-08-26 HISTORY — DX: Dependence on renal dialysis: N18.6

## 2014-08-26 HISTORY — DX: End stage renal disease: Z99.2

## 2014-08-26 LAB — COMPREHENSIVE METABOLIC PANEL
ALT: 8 U/L (ref 0–53)
ANION GAP: 23 — AB (ref 5–15)
AST: 16 U/L (ref 0–37)
Albumin: 3 g/dL — ABNORMAL LOW (ref 3.5–5.2)
Alkaline Phosphatase: 147 U/L — ABNORMAL HIGH (ref 39–117)
BILIRUBIN TOTAL: 0.4 mg/dL (ref 0.3–1.2)
BUN: 71 mg/dL — AB (ref 6–23)
CHLORIDE: 91 meq/L — AB (ref 96–112)
CO2: 21 mEq/L (ref 19–32)
Calcium: 8.4 mg/dL (ref 8.4–10.5)
Creatinine, Ser: 13.43 mg/dL — ABNORMAL HIGH (ref 0.50–1.35)
GFR calc Af Amer: 5 mL/min — ABNORMAL LOW (ref >90)
GFR calc non Af Amer: 4 mL/min — ABNORMAL LOW (ref >90)
Glucose, Bld: 85 mg/dL (ref 70–99)
Potassium: 4.9 mEq/L (ref 3.7–5.3)
Sodium: 135 mEq/L — ABNORMAL LOW (ref 137–147)
Total Protein: 9.1 g/dL — ABNORMAL HIGH (ref 6.0–8.3)

## 2014-08-26 LAB — CBC WITH DIFFERENTIAL/PLATELET
Basophils Absolute: 0 10*3/uL (ref 0.0–0.1)
Basophils Relative: 0 % (ref 0–1)
Eosinophils Absolute: 0.1 10*3/uL (ref 0.0–0.7)
Eosinophils Relative: 3 % (ref 0–5)
HCT: 23.7 % — ABNORMAL LOW (ref 39.0–52.0)
HEMOGLOBIN: 7.8 g/dL — AB (ref 13.0–17.0)
Lymphocytes Relative: 21 % (ref 12–46)
Lymphs Abs: 0.5 10*3/uL — ABNORMAL LOW (ref 0.7–4.0)
MCH: 29.7 pg (ref 26.0–34.0)
MCHC: 32.9 g/dL (ref 30.0–36.0)
MCV: 90.1 fL (ref 78.0–100.0)
MONOS PCT: 17 % — AB (ref 3–12)
Monocytes Absolute: 0.4 10*3/uL (ref 0.1–1.0)
NEUTROS ABS: 1.4 10*3/uL — AB (ref 1.7–7.7)
NEUTROS PCT: 59 % (ref 43–77)
PLATELETS: 147 10*3/uL — AB (ref 150–400)
RBC: 2.63 MIL/uL — AB (ref 4.22–5.81)
RDW: 15.5 % (ref 11.5–15.5)
WBC: 2.3 10*3/uL — ABNORMAL LOW (ref 4.0–10.5)

## 2014-08-26 LAB — RETICULOCYTES
RBC.: 2.63 MIL/uL — ABNORMAL LOW (ref 4.22–5.81)
Retic Count, Absolute: 31.6 10*3/uL (ref 19.0–186.0)
Retic Ct Pct: 1.2 % (ref 0.4–3.1)

## 2014-08-26 LAB — LIPASE, BLOOD: LIPASE: 77 U/L — AB (ref 11–59)

## 2014-08-26 MED ORDER — BISACODYL 10 MG RE SUPP: 10.0000 mg | Freq: Every day | RECTAL | Status: DC | PRN

## 2014-08-26 MED ORDER — ONDANSETRON HCL 4 MG/2ML IJ SOLN
4.0000 mg | Freq: Three times a day (TID) | INTRAMUSCULAR | Status: AC | PRN
  Administered 2014-08-26: 4 mg via INTRAVENOUS
  Filled 2014-08-26: qty 2

## 2014-08-26 MED ORDER — RENA-VITE PO TABS
1.0000 | ORAL_TABLET | Freq: Every day | ORAL | Status: DC
  Administered 2014-08-27 – 2014-08-29 (×3): 1 via ORAL
  Filled 2014-08-26 (×5): qty 1

## 2014-08-26 MED ORDER — HYDROMORPHONE HCL PF 1 MG/ML IJ SOLN
1.0000 mg | Freq: Once | INTRAMUSCULAR | Status: AC
  Administered 2014-08-26: 1 mg via INTRAVENOUS
  Filled 2014-08-26: qty 1

## 2014-08-26 MED ORDER — FOLIC ACID 1 MG PO TABS
1.0000 mg | ORAL_TABLET | Freq: Every day | ORAL | Status: DC
  Administered 2014-08-26 – 2014-08-30 (×4): 1 mg via ORAL
  Filled 2014-08-26 (×5): qty 1

## 2014-08-26 MED ORDER — ACETAMINOPHEN 650 MG RE SUPP: 650.0000 mg | Freq: Four times a day (QID) | RECTAL | Status: DC | PRN

## 2014-08-26 MED ORDER — DIPHENHYDRAMINE HCL 50 MG/ML IJ SOLN
25.0000 mg | Freq: Once | INTRAMUSCULAR | Status: AC
  Administered 2014-08-26: 25 mg via INTRAVENOUS
  Filled 2014-08-26: qty 1

## 2014-08-26 MED ORDER — HYDRALAZINE HCL 20 MG/ML IJ SOLN: 10.0000 mg | Freq: Four times a day (QID) | INTRAMUSCULAR | Status: DC | PRN

## 2014-08-26 MED ORDER — CALCIUM ACETATE 667 MG PO CAPS
2001.0000 mg | ORAL_CAPSULE | Freq: Three times a day (TID) | ORAL | Status: DC
  Administered 2014-08-27 – 2014-08-30 (×5): 2001 mg via ORAL
  Filled 2014-08-26 (×13): qty 3

## 2014-08-26 MED ORDER — SODIUM CHLORIDE 0.9 % IV BOLUS (SEPSIS)
1000.0000 mL | Freq: Once | INTRAVENOUS | Status: AC
  Administered 2014-08-26: 1000 mL via INTRAVENOUS

## 2014-08-26 MED ORDER — ONDANSETRON HCL 4 MG/2ML IJ SOLN
4.0000 mg | Freq: Once | INTRAMUSCULAR | Status: AC
  Administered 2014-08-26: 4 mg via INTRAVENOUS
  Filled 2014-08-26: qty 2

## 2014-08-26 MED ORDER — HYDROMORPHONE HCL PF 1 MG/ML IJ SOLN
1.0000 mg | INTRAMUSCULAR | Status: DC | PRN
  Administered 2014-08-26 – 2014-08-27 (×6): 2 mg via INTRAVENOUS
  Filled 2014-08-26 (×3): qty 2
  Filled 2014-08-26: qty 1
  Filled 2014-08-26 (×2): qty 2
  Filled 2014-08-26: qty 1

## 2014-08-26 MED ORDER — HYDROMORPHONE HCL PF 1 MG/ML IJ SOLN: 1.0000 mg | INTRAMUSCULAR | Status: DC | PRN

## 2014-08-26 MED ORDER — DIPHENHYDRAMINE HCL 25 MG PO CAPS
50.0000 mg | ORAL_CAPSULE | Freq: Four times a day (QID) | ORAL | Status: DC | PRN
  Administered 2014-08-26: 50 mg via ORAL
  Filled 2014-08-26: qty 2

## 2014-08-26 MED ORDER — PROMETHAZINE HCL 25 MG/ML IJ SOLN
25.0000 mg | Freq: Once | INTRAMUSCULAR | Status: AC
  Administered 2014-08-26: 25 mg via INTRAVENOUS
  Filled 2014-08-26: qty 1

## 2014-08-26 MED ORDER — METOCLOPRAMIDE HCL 5 MG/ML IJ SOLN
10.0000 mg | Freq: Once | INTRAMUSCULAR | Status: DC
  Filled 2014-08-26: qty 2

## 2014-08-26 MED ORDER — ACETAMINOPHEN 325 MG PO TABS
650.0000 mg | ORAL_TABLET | Freq: Four times a day (QID) | ORAL | Status: DC | PRN
  Filled 2014-08-26: qty 2

## 2014-08-26 MED ORDER — POLYETHYLENE GLYCOL 3350 17 G PO PACK
17.0000 g | PACK | Freq: Every day | ORAL | Status: DC | PRN
  Filled 2014-08-26: qty 1

## 2014-08-26 MED ORDER — HYDROXYUREA 500 MG PO CAPS
500.0000 mg | ORAL_CAPSULE | Freq: Two times a day (BID) | ORAL | Status: DC
  Administered 2014-08-26 – 2014-08-27 (×2): 500 mg via ORAL
  Filled 2014-08-26 (×3): qty 1

## 2014-08-26 MED ORDER — HEPARIN SODIUM (PORCINE) 5000 UNIT/ML IJ SOLN
5000.0000 [IU] | Freq: Three times a day (TID) | INTRAMUSCULAR | Status: DC
  Filled 2014-08-26 (×12): qty 1

## 2014-08-26 MED ORDER — HYDROMORPHONE HCL PF 1 MG/ML IJ SOLN
2.0000 mg | INTRAMUSCULAR | Status: DC | PRN
  Administered 2014-08-26 (×3): 2 mg via INTRAVENOUS
  Filled 2014-08-26 (×3): qty 2

## 2014-08-26 MED ORDER — LISINOPRIL 10 MG PO TABS
10.0000 mg | ORAL_TABLET | Freq: Every day | ORAL | Status: DC
  Administered 2014-08-26 – 2014-08-30 (×4): 10 mg via ORAL
  Filled 2014-08-26 (×5): qty 1

## 2014-08-26 NOTE — ED Provider Notes (Signed)
CSN: [id number]     Arrival date & time 08/26/14  1215 History   First MD Initiated Contact with Patient 08/26/14 1255     Chief Complaint  Patient presents with  . Sickle Cell Pain Crisis    The patient has been in sickle cell pain crisis since yesterday.  He says he has gotten worse and decided to come in.       (Consider location/radiation/quality/duration/timing/severity/associated sxs/prior Treatment) HPI 33 year old male with history of sickle cell disease, HIV, end-stage renal disease currently on Tuesday Thursday Saturday dialysis who presents complaining of sickle cell related pain. Patient states for the past week he has been having worsening diffuse joint pain similar to prior sickle cell crisis. Pain is compound the fact that he is unable to keep anything down for the past 3 days. States he can eat and drink he and vomited up. Vomitus is nonbloody nonbilious. Report history of pancreatitis, unsure etiology but think it may be related to his antiviral medication. He was seen in the ED 3 days ago for this complaint, was treated for his symptom, felt better however symptoms worsen at home. Patient thinks he may need to be admitted at this time. Otherwise patient denies fever, severe headache, chest pain, shortness of breath, productive cough, abdominal pain. He has history of high blood pressure and states that when pain is bad his blood pressure is out of control. Furthermore he has not been able to keep any of his medication down. Patient unsure of his CD4 count or viral load.    Past Medical History  Diagnosis Date  . DM (diabetes mellitus)   . Sickle cell anemia     States Dx at 33 yo, s/p multiple prior transfusions, typically with pain in legs, back  . HIV (human immunodeficiency virus infection) 2003    Followed by Harless Nakayama at [facility], [facility]   . End stage renal disease on dialysis HD in 2009    Gets HD at home (?). Followed by Dr. Jack Quarto (sp?) in [place]. States  due to HTN/DM. HD on T / Th / Sat  . HTN (hypertension)   . Pulmonary mass     Unclear history. States PCP was following a pulmonary mass with serial imaging  . Pancreatitis 2012    Denies any alcohol use   . Hypertension   . Diabetes mellitus   . Sickle cell anemia   . Renal failure   . Renal failure   . HIV (human immunodeficiency virus infection)   . Blood transfusion    Past Surgical History  Procedure Laterality Date  . Dialysis fistula creation    . Hip surgery      "screw placed into joint"  . Av fistula placement     Family History  Problem Relation Age of Onset  . Hypertension Father   . Diabetes      Uncles and grandfather  . Cervical cancer Mother     Dec 46yo of cervical ca   History  Substance Use Topics  . Smoking status: Current Every Day Smoker -- 0.25 packs/day for 10 years    Types: Cigarettes  . Smokeless tobacco: Never Used  . Alcohol Use: No    Review of Systems  All other systems reviewed and are negative.     Allergies  Review of patient's allergies indicates no known allergies.  Home Medications   Prior to Admission medications   Medication Sig Start Date End Date Taking? Authorizing Provider  b complex-vitamin  c-folic acid (NEPHRO-VITE) 0.8 MG TABS tablet Take 1 tablet by mouth at bedtime.    Historical Provider, MD  calcium acetate (PHOSLO) 667 MG capsule Take 2,001 mg by mouth 3 (three) times daily with meals.    Historical Provider, MD  diphenhydrAMINE (BENADRYL) 50 MG capsule Take 50 mg by mouth every 6 (six) hours as needed for itching or allergies.    Historical Provider, MD  folic acid (FOLVITE) 1 MG tablet Take 1 mg by mouth daily.    Historical Provider, MD  HYDROmorphone (DILAUDID) 8 MG tablet Take 8 mg by mouth every 4 (four) hours as needed for severe pain.    Historical Provider, MD  hydroxyurea (HYDREA) 500 MG capsule Take 500 mg by mouth 2 (two) times daily. May take with food to minimize GI side effects.    Historical  Provider, MD  lisinopril (PRINIVIL,ZESTRIL) 10 MG tablet Take 10 mg by mouth daily.    Historical Provider, MD  ondansetron (ZOFRAN-ODT) 4 MG disintegrating tablet Take 4 mg by mouth every 8 (eight) hours as needed for nausea or vomiting.    Historical Provider, MD  promethazine (PHENERGAN) 50 MG tablet Take 50 mg by mouth every 6 (six) hours as needed for nausea or vomiting.    Historical Provider, MD   BP 170/89  Pulse 94  Temp(Src) 97.9 F (36.6 C) (Oral)  Resp 18  SpO2 98% Physical Exam  Constitutional: He appears well-developed and well-nourished. No distress.  African American male appears to be in no acute distress  HENT:  Head: Atraumatic.  Eyes: Conjunctivae are normal. No scleral icterus.  Neck: Normal range of motion. Neck supple.  Cardiovascular:  Tachycardia without murmur rubs or gallops  Pulmonary/Chest: Effort normal and breath sounds normal. No respiratory distress.  Faint crackles heard at the base of lung  Abdominal: Soft. There is no tenderness.  Musculoskeletal: He exhibits tenderness (Diffuse tenderness throughout upper and lower extremities without focal point tenderness. No evidence of septic joint on exam. Able to move all extremities without difficulty.).  Neurological: He is alert.  Skin: No rash noted.  Psychiatric: He has a normal mood and affect.    ED Course  Procedures (including critical care time)  1:42 PM Patient with history of sickle cell disease presents with worsening pain suggestive of sickle cell crisis. Patient also reported having persistent nausea and vomiting, has a history of this in the past. Plan to obtain blood work, and we'll treat symptoms. If no improvement of pain, we'll consider admission for further management. At this time he is afebrile with a complaint of chest pain or shortness of breath worrisome for acute chest syndrome. He is hypertensive with systolic blood pressure in the 200s. It has not been taking his  medication.  5:08 PM Pt has received a total of  of dilaudid along with benadryl/zofran/phenergan with minimal improvement.  He request to be admitted, still having nausea.  His Hgb 7.8, at his baseline.  Normal K+ of 4.9.  Lipase mildly elevated at 77.    I have consulted with [facility], Dr. Randol Kern who agrees to admit pt for further treatment of sickle cell related pain.    Labs Review Labs Reviewed  CBC WITH DIFFERENTIAL - Abnormal; Notable for the following:    WBC 2.3 (*)    RBC 2.63 (*)    Hemoglobin 7.8 (*)    HCT 23.7 (*)    Platelets 147 (*)    Neutro Abs 1.4 (*)    Lymphs  Abs 0.5 (*)    Monocytes Relative 17 (*)    All other components within normal limits  COMPREHENSIVE METABOLIC PANEL - Abnormal; Notable for the following:    Sodium 135 (*)    Chloride 91 (*)    BUN 71 (*)    Creatinine, Ser 13.43 (*)    Total Protein 9.1 (*)    Albumin 3.0 (*)    Alkaline Phosphatase 147 (*)    GFR calc non Af Amer 4 (*)    GFR calc Af Amer 5 (*)    Anion gap 23 (*)    All other components within normal limits  RETICULOCYTES - Abnormal; Notable for the following:    RBC. 2.63 (*)    All other components within normal limits  LIPASE, BLOOD - Abnormal; Notable for the following:    Lipase 77 (*)    All other components within normal limits    Imaging Review No results found.   EKG Interpretation None      MDM   Final diagnoses:  Sickle cell anemia with pain  Dialysis patient  Non-intractable vomiting with nausea, vomiting of unspecified type    BP 197/98  Pulse 96  Temp(Src) 97.9 F (36.6 C) (Oral)  Resp 18  SpO2 100%  I have reviewed nursing notes and vital signs. I personally reviewed the imaging tests through PACS system  I reviewed available ER/hospitalization records thought the EMR     Fayrene Helper, [place] 08/26/14 1721

## 2014-08-26 NOTE — Consult Note (Signed)
Reason for Consult: Continuity of ESRD care Referring Physician: Elgergawy MD [facility])  HPI:  33 year old African American man with past medical history significant for sickle cell disease and ESRD apparently secondary to hypertension and diabetes on hemodialysis on a Tuesday, Thursday and Saturday schedule in [place] who missed his dialysis treatment today due to possible pain crisis. He also has a history of HIV infection and he stopped taking his antiretrovirals therapy due to adverse effects.  He lives between [place] and [place] per his account although he is caught of his nephrology care in [place]. He denies any shortness of breath and states that he is hurting all over-improved with intravenous Dilaudid.  Reports that his estimated dry weight is 80 kg and he dialyzes for 4 hours through his left forearm loop graft  Past Medical History  Diagnosis Date  . DM (diabetes mellitus)   . Sickle cell anemia     States Dx at 33 yo, s/p multiple prior transfusions, typically with pain in legs, back  . HIV (human immunodeficiency virus infection) 2003    Followed by Patricia Nettle at [facility], [facility]   . End stage renal disease on dialysis HD in 2009    Gets HD at home (?). Followed by Dr. Madaline Guthrie (sp?) in [place]. States due to HTN/DM. HD on T / Th / Sat  . HTN (hypertension)   . Pulmonary mass     Unclear history. States PCP was following a pulmonary mass with serial imaging  . Pancreatitis 2012    Denies any alcohol use   . Hypertension   . Diabetes mellitus   . Sickle cell anemia   . Renal failure   . Renal failure   . HIV (human immunodeficiency virus infection)   . Blood transfusion     Past Surgical History  Procedure Laterality Date  . Dialysis fistula creation    . Hip surgery      "screw placed into joint"  . Av fistula placement      Family History  Problem Relation Age of Onset  . Hypertension Father   . Diabetes      Uncles and grandfather  . Cervical cancer  Mother     Dec 46yo of cervical ca    Social History:  reports that he has been smoking Cigarettes.  He has a 2.5 pack-year smoking history. He has never used smokeless tobacco. He reports that he does not drink alcohol or use illicit drugs.  Allergies: No Known Allergies  Medications:  Scheduled: . [START ON 08/27/2014] calcium acetate  2,001 mg Oral TID WC  . folic acid  1 mg Oral Daily  . heparin  5,000 Units Subcutaneous 3 times per day  . hydroxyurea  500 mg Oral BID  . lisinopril  10 mg Oral Daily  . multivitamin  1 tablet Oral QHS    Results for orders placed during the hospital encounter of 08/26/14 (from the past 48 hour(s))  CBC WITH DIFFERENTIAL     Status: Abnormal   Collection Time    08/26/14  2:00 PM      Result Value Ref Range   WBC 2.3 (*) 4.0 - 10.5 K/uL   RBC 2.63 (*) 4.22 - 5.81 MIL/uL   Hemoglobin 7.8 (*) 13.0 - 17.0 g/dL   HCT 23.7 (*) 39.0 - 52.0 %   MCV 90.1  78.0 - 100.0 fL   MCH 29.7  26.0 - 34.0 pg   MCHC 32.9  30.0 - 36.0 g/dL  RDW 15.5  11.5 - 15.5 %   Platelets 147 (*) 150 - 400 K/uL   Neutrophils Relative % 59  43 - 77 %   Neutro Abs 1.4 (*) 1.7 - 7.7 K/uL   Lymphocytes Relative 21  12 - 46 %   Lymphs Abs 0.5 (*) 0.7 - 4.0 K/uL   Monocytes Relative 17 (*) 3 - 12 %   Monocytes Absolute 0.4  0.1 - 1.0 K/uL   Eosinophils Relative 3  0 - 5 %   Eosinophils Absolute 0.1  0.0 - 0.7 K/uL   Basophils Relative 0  0 - 1 %   Basophils Absolute 0.0  0.0 - 0.1 K/uL  COMPREHENSIVE METABOLIC PANEL     Status: Abnormal   Collection Time    08/26/14  2:00 PM      Result Value Ref Range   Sodium 135 (*) 137 - 147 mEq/L   Potassium 4.9  3.7 - 5.3 mEq/L   Chloride 91 (*) 96 - 112 mEq/L   CO2 21  19 - 32 mEq/L   Glucose, Bld 85  70 - 99 mg/dL   BUN 71 (*) 6 - 23 mg/dL   Creatinine, Ser 13.43 (*) 0.50 - 1.35 mg/dL   Calcium 8.4  8.4 - 10.5 mg/dL   Total Protein 9.1 (*) 6.0 - 8.3 g/dL   Albumin 3.0 (*) 3.5 - 5.2 g/dL   AST 16  0 - 37 U/L   ALT 8  0 - 53  U/L   Alkaline Phosphatase 147 (*) 39 - 117 U/L   Total Bilirubin 0.4  0.3 - 1.2 mg/dL   GFR calc non Af Amer 4 (*) >90 mL/min   GFR calc Af Amer 5 (*) >90 mL/min   Comment: (NOTE)     The eGFR has been calculated using the CKD EPI equation.     This calculation has not been validated in all clinical situations.     eGFR's persistently <90 mL/min signify possible Chronic Kidney     Disease.   Anion gap 23 (*) 5 - 15  RETICULOCYTES     Status: Abnormal   Collection Time    08/26/14  2:00 PM      Result Value Ref Range   Retic Ct Pct 1.2  0.4 - 3.1 %   RBC. 2.63 (*) 4.22 - 5.81 MIL/uL   Retic Count, Manual 31.6  19.0 - 186.0 K/uL  LIPASE, BLOOD     Status: Abnormal   Collection Time    08/26/14  2:00 PM      Result Value Ref Range   Lipase 77 (*) 11 - 59 U/L    No results found.  Review of Systems  Constitutional: Positive for chills. Negative for fever, weight loss, malaise/fatigue and diaphoresis.  HENT: Negative for ear discharge, ear pain, hearing loss, nosebleeds and tinnitus.   Eyes: Negative.   Respiratory: Negative.   Cardiovascular: Negative.   Gastrointestinal: Positive for nausea. Negative for heartburn, vomiting, abdominal pain and diarrhea.  Genitourinary: Negative.   Musculoskeletal: Positive for back pain, joint pain and myalgias. Negative for falls and neck pain.  Skin: Negative.   Neurological: Positive for headaches. Negative for dizziness, tingling, tremors and sensory change.  Psychiatric/Behavioral: Negative.   All other systems reviewed and are negative.  Blood pressure 165/83, pulse 93, temperature 97.8 F (36.6 C), temperature source Oral, resp. rate 17, SpO2 95.00%. Physical Exam  Nursing note and vitals reviewed. Constitutional: He is oriented to person, place,  and time. He appears well-developed and well-nourished. No distress.  HENT:  Head: Normocephalic.  Nose: Nose normal.  Mouth/Throat: Oropharynx is clear and moist.  Eyes: EOM are  normal. Pupils are equal, round, and reactive to light.  Neck: Normal range of motion. Neck supple. No JVD present. No thyromegaly present.  Cardiovascular: Normal rate.   Murmur heard. 2/6 HSM over apex  Respiratory: Effort normal and breath sounds normal. No respiratory distress. He has no wheezes. He has no rales.  GI: Soft. Bowel sounds are normal. He exhibits no distension. There is no tenderness. There is no rebound.  Musculoskeletal: Normal range of motion. He exhibits no edema.  Left forearm loop AVG  Neurological: He is alert and oriented to person, place, and time. No cranial nerve deficit.  Skin: Skin is warm and dry.  Psychiatric: He has a normal mood and affect.    Assessment/Plan: 1. Sickle cell crisis: Appears to be responding well to pain control, continue to monitor per hospitalist service. 2. End-stage renal disease: Hemodialysis tomorrow to make up for 2 days missed treatment-he currently has no acute dialysis indications to prompt urgent hemodialysis. 3. Anemia: Bifactorial from ESRD and sickle cell disease, monitor on ESA therapy 4. Metabolic bone disease: Resume phosphate binding therapy and reconcile vitamin D receptor analog when records become available. 5. Hypertension: Resume oral antihypertensive therapy, anticipate to improve further with dialysis/ultrafiltration.  Noya Santarelli K. 08/26/2014, 7:10 PM

## 2014-08-26 NOTE — ED Notes (Signed)
The patient has been in sickle cell pain crisis since yesterday.  He says he has gotten worse and decided to come in.  He rates his pain 10/10 and it is generalized pain.

## 2014-08-26 NOTE — H&P (Signed)
Patient Demographics  [name], is a 33 y.o. male  MRN: [id number]   DOB - 07-May-1981  Admit Date - 08/26/2014  Outpatient Primary MD for the patient is Pcp Not In System   With History of -  Past Medical History  Diagnosis Date  . DM (diabetes mellitus)   . Sickle cell anemia     States Dx at 33 yo, s/p multiple prior transfusions, typically with pain in legs, back  . HIV (human immunodeficiency virus infection) 2003    Followed by Harless Nakayama at [facility], [facility]   . End stage renal disease on dialysis HD in 2009    Gets HD at home (?). Followed by Dr. Jack Quarto (sp?) in [place]. States due to HTN/DM. HD on T / Th / Sat  . HTN (hypertension)   . Pulmonary mass     Unclear history. States PCP was following a pulmonary mass with serial imaging  . Pancreatitis 2012    Denies any alcohol use   . Hypertension   . Diabetes mellitus   . Sickle cell anemia   . Renal failure   . Renal failure   . HIV (human immunodeficiency virus infection)   . Blood transfusion       Past Surgical History  Procedure Laterality Date  . Dialysis fistula creation    . Hip surgery      "screw placed into joint"  . Av fistula placement      in for   Chief Complaint  Patient presents with  . Sickle Cell Pain Crisis    The patient has been in sickle cell pain crisis since yesterday.  He says he has gotten worse and decided to come in.       HPI  [name]  is a 33 y.o. male, with known history of sickle cell disease, HIV, end-stage renal disease on hemodialysis Tuesday Thursday Saturday, he gets his hemodialysis at the [facility] with [facility], patient presents with complaints of worsening diffuse joint pain and he states all over, and reports that his symptoms resembles his sickle cell crisis, reports last time he was admitted for sickle cell crisis was last year, reports his symptoms has been going on for the last few days, as well reports nausea and vomiting,  denies any coffee-ground emesis, any bright blood per rectum, as well denies any cough, fever, or chills, patient reports he has not been taking his HIV medications for a few months now, as he has not been feeling well when he was taking them, he does not know when washes his CD4 count or viral load as it has not been checked for a long time, as he did not see his infectious disease doctor in [place] for few month. Patient received multiple doses of IV dye loaded in ED, without significant relief of his pain, so hospitalist requested to admit the patient.    Review of Systems    In addition to the HPI above,  No Fever-chills, No Headache, No changes with Vision or hearing, No problems swallowing food or Liquids, No Chest pain, Cough or Shortness of Breath, No Abdominal pain, No Nausea or Vommitting, Bowel movements are regular, No Blood in stool or Urine, No dysuria, No new skin rashes or bruises, Complains of generalized joints pains-aches,  No new weakness, tingling, numbness in any extremity, No recent weight gain or loss, No polyuria, polydypsia or polyphagia, No significant Mental Stressors.  A full 10 point Review of  Systems was done, except as stated above, all other Review of Systems were negative.   Social History History  Substance Use Topics  . Smoking status: Current Every Day Smoker -- 0.25 packs/day for 10 years    Types: Cigarettes  . Smokeless tobacco: Never Used  . Alcohol Use: No     Family History Family History  Problem Relation Age of Onset  . Hypertension Father   . Diabetes      Uncles and grandfather  . Cervical cancer Mother     Dec 46yo of cervical ca     Prior to Admission medications   Medication Sig Start Date End Date Taking? Authorizing Provider  b complex-vitamin c-folic acid (NEPHRO-VITE) 0.8 MG TABS tablet Take 1 tablet by mouth at bedtime.   Yes Historical Provider, MD  calcium acetate (PHOSLO) 667 MG capsule Take 2,001 mg  by mouth 3 (three) times daily with meals.   Yes Historical Provider, MD  diphenhydrAMINE (BENADRYL) 50 MG capsule Take 50 mg by mouth every 6 (six) hours as needed for itching or allergies.   Yes Historical Provider, MD  folic acid (FOLVITE) 1 MG tablet Take 1 mg by mouth daily.   Yes Historical Provider, MD  HYDROmorphone (DILAUDID) 8 MG tablet Take 8 mg by mouth every 4 (four) hours as needed for severe pain.   Yes Historical Provider, MD  hydroxyurea (HYDREA) 500 MG capsule Take 500 mg by mouth 2 (two) times daily. May take with food to minimize GI side effects.   Yes Historical Provider, MD  lisinopril (PRINIVIL,ZESTRIL) 10 MG tablet Take 10 mg by mouth daily.   Yes Historical Provider, MD    No Known Allergies  Physical Exam  Vitals  Blood pressure 197/98, pulse 96, temperature 97.9 F (36.6 C), temperature source Oral, resp. rate 18, SpO2 100.00%.   1. General a well-nourished male lying in bed in NAD,    2. Normal affect and insight, Not Suicidal or Homicidal, Awake Alert, Oriented X 3.  3. No F.N deficits, ALL C.Nerves Intact, Strength 5/5 all 4 extremities, Sensation intact all 4 extremities, Plantars down going.  4. Ears and Eyes appear Normal, Conjunctivae clear, PERRLA. Moist Oral Mucosa.  5. Supple Neck, No JVD, No cervical lymphadenopathy appriciated, No Carotid Bruits.  6. Symmetrical Chest wall movement, Good air movement bilaterally, CTAB.  7. RRR, No Gallops, Rubs or Murmurs, No Parasternal Heave.  8. Positive Bowel Sounds, Abdomen Soft, No tenderness, No organomegaly appriciated,No rebound -guarding or rigidity.  9.  No Cyanosis, Normal Skin Turgor, No Skin Rash or Bruise.  10. Good muscle tone,  joints appear normal , no effusions or any evidence of septic joint could be appreciated on my physical exam, just mild joint tenderness to palpation. Normal ROM.  11. No Palpable Lymph Nodes in Neck or Axillae    Data Review  CBC  Recent Labs Lab  08/20/14 0810 08/23/14 0743 08/26/14 1400  WBC 2.7* 2.8* 2.3*  HGB 8.9* 7.9* 7.8*  HCT 27.7* 25.2* 23.7*  PLT 104* 116* 147*  MCV 93.0 94.7 90.1  MCH 29.9 29.7 29.7  MCHC 32.1 31.3 32.9  RDW 15.3 15.1 15.5  LYMPHSABS 0.7 0.5* 0.5*  MONOABS 0.3 0.2 0.4  EOSABS 0.0 0.1 0.1  BASOSABS 0.0 0.0 0.0   ------------------------------------------------------------------------------------------------------------------  Chemistries   Recent Labs Lab 08/20/14 0810 08/23/14 0743 08/26/14 1400  NA 134* 138 135*  K 4.6 4.5 4.9  CL 90* 92* 91*  CO2 GLUCOSE  104* 93 85  BUN 36* 44* 71*  CREATININE 7.54* 8.19* 13.43*  CALCIUM 8.5 8.1* 8.4  AST ALT ALKPHOS 170* 147* 147*  BILITOT 0.6 0.4 0.4   ------------------------------------------------------------------------------------------------------------------ CrCl is unknown because both a height and weight (above a minimum accepted value) are required for this calculation. ------------------------------------------------------------------------------------------------------------------ No results found for this basename: TSH, T4TOTAL, FREET3, T3FREE, THYROIDAB,  in the last 72 hours   Coagulation profile No results found for this basename: INR, PROTIME,  in the last 168 hours ------------------------------------------------------------------------------------------------------------------- No results found for this basename: DDIMER,  in the last 72 hours -------------------------------------------------------------------------------------------------------------------  Cardiac Enzymes No results found for this basename: CK, CKMB, TROPONINI, MYOGLOBIN,  in the last 168 hours ------------------------------------------------------------------------------------------------------------------ No components found with this basename: POCBNP,     ---------------------------------------------------------------------------------------------------------------  Urinalysis No results found for this basename: colorurine, appearanceur, labspec, phurine, glucoseu, hgbur, bilirubinur, ketonesur, proteinur, urobilinogen, nitrite, leukocytesur    ----------------------------------------------------------------------------------------------------------------  Imaging results:   No results found.     Assessment & Plan  Active Problems:   ESRD on dialysis   DM (diabetes mellitus)   HTN (hypertension)   HIV (human immunodeficiency virus infection)   Sickle cell anemia   Sickle cell anemia with pain   Sickle cell pain crisis    1. sickle cell pain crisis/anemia: Patient hemoglobin appears to be at baseline, with reticulocyte count of 31, there is no indication for transfusion at this point, as well given the patient in stage renal disease, and anuric, can't be started on IV fluid, will resume back on hydroxyurea and folic acid, and we'll keep on when necessary IV dye loaded 1-2 mg every 3 hours as needed.  2. End-stage renal disease:  She missed his hemodialysis today, but does not appear to be in any volume overload, so there is no indication for emergent hemodialysis, consulted nephrology to resume back on hemodialysis in the morning.  3. History of HIV:  Patient does not appear to be taking any of his home medication, will check CD4, but appears he has issue with noncompliant, and recommendation should be him to follow as an outpatient with ID clinic if he chooses to resume back is HIV meds.  4. History of diabetes mellitus:  Monitor his CBG, and if needed will start him on insulin sliding scale, as he does not appear to be taking any medications at home.  5. Hypertension:  Uncontrolled, will resume back on his lisinopril, will add when necessary IV hydralazine, I would anticipate improvement in his blood pressure after he  is pale.   DVT Prophylaxis Heparin - SCDs  AM Labs Ordered, also please review Full Orders  Family Communication: Admission, patients condition and plan of care including tests being ordered have been discussed with the patient who indicate understanding and agree with the plan and Code Status.  Code Status full  Likely DC to  home  Condition GUARDED    Time spent in minutes : 55 minute    Angelyse Heslin M.D on 08/26/2014 at 5:43 PM  Between 7am to 7pm - Pager - [phone]  After 7pm go to [email or web address] - password [email or web address]  And look for the night coverage person covering me after hours  [facility] Office  [phone]   **Disclaimer: This note may have been dictated with voice recognition software. Similar sounding words can inadvertently be transcribed and this note may contain transcription errors which may not have been corrected upon publication of note.**

## 2014-08-27 DIAGNOSIS — N186 End stage renal disease: Secondary | ICD-10-CM

## 2014-08-27 DIAGNOSIS — E139 Other specified diabetes mellitus without complications: Secondary | ICD-10-CM

## 2014-08-27 DIAGNOSIS — D5700 Hb-SS disease with crisis, unspecified: Principal | ICD-10-CM

## 2014-08-27 DIAGNOSIS — Z992 Dependence on renal dialysis: Secondary | ICD-10-CM

## 2014-08-27 LAB — BASIC METABOLIC PANEL
Anion gap: 23 — ABNORMAL HIGH (ref 5–15)
BUN: 81 mg/dL — AB (ref 6–23)
CALCIUM: 8.2 mg/dL — AB (ref 8.4–10.5)
CO2: 20 meq/L (ref 19–32)
CREATININE: 14.26 mg/dL — AB (ref 0.50–1.35)
Chloride: 91 mEq/L — ABNORMAL LOW (ref 96–112)
GFR calc Af Amer: 5 mL/min — ABNORMAL LOW (ref >90)
GFR calc non Af Amer: 4 mL/min — ABNORMAL LOW (ref >90)
Glucose, Bld: 93 mg/dL (ref 70–99)
Potassium: 6 mEq/L — ABNORMAL HIGH (ref 3.7–5.3)
Sodium: 134 mEq/L — ABNORMAL LOW (ref 137–147)

## 2014-08-27 LAB — CBC
HEMATOCRIT: 24.8 % — AB (ref 39.0–52.0)
Hemoglobin: 7.9 g/dL — ABNORMAL LOW (ref 13.0–17.0)
MCH: 28.8 pg (ref 26.0–34.0)
MCHC: 31.9 g/dL (ref 30.0–36.0)
MCV: 90.5 fL (ref 78.0–100.0)
PLATELETS: 138 10*3/uL — AB (ref 150–400)
RBC: 2.74 MIL/uL — AB (ref 4.22–5.81)
RDW: 15.6 % — ABNORMAL HIGH (ref 11.5–15.5)
WBC: 2.9 10*3/uL — ABNORMAL LOW (ref 4.0–10.5)

## 2014-08-27 LAB — HEPATITIS B SURFACE ANTIGEN: Hepatitis B Surface Ag: NEGATIVE

## 2014-08-27 MED ORDER — LIDOCAINE-PRILOCAINE 2.5-2.5 % EX CREA: 1.0000 "application " | TOPICAL_CREAM | CUTANEOUS | Status: DC | PRN

## 2014-08-27 MED ORDER — NEPRO/CARBSTEADY PO LIQD: 237.0000 mL | ORAL | Status: DC | PRN

## 2014-08-27 MED ORDER — SODIUM CHLORIDE 0.9 % IV SOLN: 100.0000 mL | INTRAVENOUS | Status: DC | PRN

## 2014-08-27 MED ORDER — HYDROMORPHONE HCL PF 1 MG/ML IJ SOLN
1.0000 mg | INTRAMUSCULAR | Status: DC | PRN
  Administered 2014-08-27 – 2014-08-30 (×22): 1 mg via INTRAVENOUS
  Filled 2014-08-27 (×20): qty 1

## 2014-08-27 MED ORDER — HEPARIN SODIUM (PORCINE) 1000 UNIT/ML DIALYSIS
1000.0000 [IU] | INTRAMUSCULAR | Status: DC | PRN
  Filled 2014-08-27: qty 1

## 2014-08-27 MED ORDER — HEPARIN SODIUM (PORCINE) 1000 UNIT/ML DIALYSIS
4000.0000 [IU] | INTRAMUSCULAR | Status: DC | PRN
  Filled 2014-08-27: qty 4

## 2014-08-27 MED ORDER — DARBEPOETIN ALFA-POLYSORBATE 100 MCG/0.5ML IJ SOLN: 100.0000 ug | INTRAMUSCULAR | Status: DC

## 2014-08-27 MED ORDER — LIDOCAINE HCL (PF) 1 % IJ SOLN: 5.0000 mL | INTRAMUSCULAR | Status: DC | PRN

## 2014-08-27 MED ORDER — CALCITRIOL 0.5 MCG PO CAPS
0.5000 ug | ORAL_CAPSULE | ORAL | Status: DC
  Filled 2014-08-27 (×2): qty 1

## 2014-08-27 MED ORDER — PENTAFLUOROPROP-TETRAFLUOROETH EX AERO: 1.0000 "application " | INHALATION_SPRAY | CUTANEOUS | Status: DC | PRN

## 2014-08-27 MED ORDER — HYDROMORPHONE HCL PF 1 MG/ML IJ SOLN
INTRAMUSCULAR | Status: AC
  Filled 2014-08-27: qty 2

## 2014-08-27 MED ORDER — ALTEPLASE 2 MG IJ SOLR
2.0000 mg | Freq: Once | INTRAMUSCULAR | Status: DC | PRN
  Filled 2014-08-27: qty 2

## 2014-08-27 MED ORDER — SODIUM CHLORIDE 0.9 % IV SOLN
125.0000 mg | INTRAVENOUS | Status: DC
  Administered 2014-08-28 – 2014-08-30 (×2): 125 mg via INTRAVENOUS
  Filled 2014-08-27 (×4): qty 10

## 2014-08-27 NOTE — Progress Notes (Signed)
Subjective:  Seen on HD- still c/o pain and nausea- last HD was 9/3 Objective Vital signs in last 24 hours: Filed Vitals:   08/26/14 1752 08/26/14 1838 08/26/14 2213 08/27/14 0541  BP: 191/98 165/83 120/79 124/58  Pulse: 88 93 103 87  Temp:  97.8 F (36.6 C) 97.7 F (36.5 C) 98.2 F (36.8 C)  TempSrc:  Oral Oral Oral  Resp:  SpO2: 96% 95% 94% 98%   Weight change:   Intake/Output Summary (Last 24 hours) at 08/27/14 0934 Last data filed at 08/26/14 2312  Gross per 24 hour  Intake    720 ml  Output      0 ml  Net    720 ml    Assessment/ Plan: Pt is a 33 y.o. yo male with ESRD who was admitted on 08/26/2014 with possible sickle cell crisis and no HD since 9/3  Assessment/Plan: 1. Sickle cell crisis- pain management per primary team - hydrea/dilaudid 2. ESRD - normally TTS at [facility]- via AVF- no HD since 9/3-planning for today and then he wants to do tomorrow to get back on schedule- this may help his nausea 3. Anemia- profound- maybe also due to HIV- continue iron and ESA 4. Secondary hyperparathyroidism- on oral calcitriol with HD will continue- also on phoslo 5. HTN/volume- needs volume removed- tells me his EDW is 46- will UF with HD- on lisinopril 10 as well 6. Nausea- possibly uremia vs other- on antiemetics- HD should help 7. HIV- on meds  Watt Geiler A    Labs: Basic Metabolic Panel:  Recent Labs Lab 08/23/14 0743 08/26/14 1400  NA 138 135*  K 4.5 4.9  CL 92* 91*  CO2 26 21  GLUCOSE 93 85  BUN 44* 71*  CREATININE 8.19* 13.43*  CALCIUM 8.1* 8.4   Liver Function Tests:  Recent Labs Lab 08/23/14 0743 08/26/14 1400  AST 16 16  ALT 8 8  ALKPHOS 147* 147*  BILITOT 0.4 0.4  PROT 9.0* 9.1*  ALBUMIN 2.8* 3.0*    Recent Labs Lab 08/23/14 0743 08/26/14 1400  LIPASE 81* 77*   No results found for this basename: AMMONIA,  in the last 168 hours CBC:  Recent Labs Lab 08/23/14 0743 08/26/14 1400  WBC 2.8* 2.3*  NEUTROABS 2.0 1.4*   HGB 7.9* 7.8*  HCT 25.2* 23.7*  MCV 94.7 90.1  PLT 116* 147*   Cardiac Enzymes: No results found for this basename: CKTOTAL, CKMB, CKMBINDEX, TROPONINI,  in the last 168 hours CBG: No results found for this basename: GLUCAP,  in the last 168 hours  Iron Studies: No results found for this basename: IRON, TIBC, TRANSFERRIN, FERRITIN,  in the last 72 hours Studies/Results: No results found. Medications: Infusions:    Scheduled Medications: . calcium acetate  2,001 mg Oral TID WC  . folic acid  1 mg Oral Daily  . heparin  5,000 Units Subcutaneous 3 times per day  . hydroxyurea  500 mg Oral BID  . lisinopril  10 mg Oral Daily  . multivitamin  1 tablet Oral QHS    have reviewed scheduled and prn medications.  Physical Exam: General: NAD Heart: RRR Lungs: mostly clear Abdomen: soft, non tender, non distended  Extremities: has edema Dialysis Access: AVF right upper arm    08/27/2014,9:34 AM  LOS: 1 day

## 2014-08-27 NOTE — Clinical Documentation Improvement (Signed)
Possible Clinical Conditions? HIV Disease AIDS AIDS Related Complex HIV (+) unsymptomatic Symptomatic HIV Other Condition Cannot Clinically Determine   Supporting Information: (As per notes) "History of HIV: " -reportedly followed by ID clinic in [place], stopped taking HAART due to side effects, plans to FU at [facility] ID clinic  -CD4 and Viral load pending   Thank You, Nevin Bloodgood, RN, BSN, CCDS,Clinical Documentation Specialist:  [phone]  [phone]=[facility] [place]- Health Information Management

## 2014-08-27 NOTE — Progress Notes (Signed)
Chaplain responded to a consult placed by patient for prayer. Pt wasn't feeling up to a visit today and asked me to return tomorrow. Page if needed.  Gala Romney 08/27/2014 2:45 PM

## 2014-08-27 NOTE — Procedures (Signed)
Patient was seen on dialysis and the procedure was supervised.  BFR 400  Via AVF BP is  169/100.   Patient appears to be tolerating treatment well  [name] A 08/27/2014

## 2014-08-27 NOTE — Progress Notes (Addendum)
[facility] PROGRESS NOTE  [name] JWJ:[id number] DOB: 04/30/1981 DOA: 08/26/2014 PCP: Pcp Not In System  Assessment/Plan: 1. Sickle cell pain crisis/anemia:  -doubt true crises -Hb appears to be at baseline, with reticulocyte count of 31, bili normal -continue hydroxyurea and folic acid, PRN dilaudid  2. End-stage renal disease:  -HD per Renal  3. HIV Disease -reportedly followed by ID clinic in [place], stopped taking HAART due to side effects, plans to FU at [facility] ID clinic -CD4 and Viral load pending  4. History of diabetes mellitus:  -CBGs 85-100 -check hbaic  5. Nausea -chronic problem, has been hospitalized from 2012 onwards at [facility] [place] and [facility] with same, felt to have chronic pancreatitis and also drug seeking behavior suspected based on DC summaries -due to CKD, vs chronic pancreatitis -Lipase improving, almost normal now -diet as tolerated  6. Hypertension:  -continue lisinopril, PRN Hydralazine  7. Pancytopenia -chronic, due to HIV most likely, defer to ID at [facility]  8. Hyperkalemia -should improve with HD today  DVT Prophylaxis Heparin  Code Status: Full Code Family Communication: none at bedside Disposition Plan: home tomorrow after HD   Consultants:  Renal  HPI/Subjective: Some nausea, better than yesterday  Objective: Filed Vitals:   08/27/14 1200  BP: 178/95  Pulse: 96  Temp:   Resp:     Intake/Output Summary (Last 24 hours) at 08/27/14 1219 Last data filed at 08/26/14 2312  Gross per 24 hour  Intake    720 ml  Output      0 ml  Net    720 ml   Filed Weights   08/27/14 0903  Weight: 93.4 kg (205 lb 14.6 oz)    Exam:   General:  AAOx3, no distress  Cardiovascular: S1S2/RRR  Respiratory: CTAB  Abdomen: soft, Nt, BS present  Musculoskeletal: trace edema   Data Reviewed: Basic Metabolic Panel:  Recent Labs Lab 08/23/14 0743 08/26/14 1400 08/27/14 0900  NA 138 135* 134*  K 4.5 4.9 6.0*  CL  92* 91* 91*  CO2 GLUCOSE 93 85 93  BUN 44* 71* 81*  CREATININE 8.19* 13.43* 14.26*  CALCIUM 8.1* 8.4 8.2*   Liver Function Tests:  Recent Labs Lab 08/23/14 0743 08/26/14 1400  AST 16 16  ALT 8 8  ALKPHOS 147* 147*  BILITOT 0.4 0.4  PROT 9.0* 9.1*  ALBUMIN 2.8* 3.0*    Recent Labs Lab 08/23/14 0743 08/26/14 1400  LIPASE 81* 77*   No results found for this basename: AMMONIA,  in the last 168 hours CBC:  Recent Labs Lab 08/23/14 0743 08/26/14 1400 08/27/14 0900  WBC 2.8* 2.3* 2.9*  NEUTROABS 2.0 1.4*  --   HGB 7.9* 7.8* 7.9*  HCT 25.2* 23.7* 24.8*  MCV 94.7 90.1 90.5  PLT 116* 147* 138*   Cardiac Enzymes: No results found for this basename: CKTOTAL, CKMB, CKMBINDEX, TROPONINI,  in the last 168 hours BNP (last 3 results) No results found for this basename: PROBNP,  in the last 8760 hours CBG: No results found for this basename: GLUCAP,  in the last 168 hours  No results found for this or any previous visit (from the past 240 hour(s)).   Studies: No results found.  Scheduled Meds: . calcitRIOL  0.5 mcg Oral Once per day on Mon Wed Fri  . calcium acetate  2,001 mg Oral TID WC  . [START ON 09/02/2014] darbepoetin (ARANESP) injection - DIALYSIS  100 mcg Intravenous Q Tue-HD  . [START  ON 08/28/2014] ferric gluconate (FERRLECIT/NULECIT) IV  125 mg Intravenous Q T,Th,Sa-HD  . folic acid  1 mg Oral Daily  . heparin  5,000 Units Subcutaneous 3 times per day  . HYDROmorphone      . hydroxyurea  500 mg Oral BID  . lisinopril  10 mg Oral Daily  . multivitamin  1 tablet Oral QHS   Continuous Infusions:  Antibiotics Given (last 72 hours)   None      Active Problems:   ESRD on dialysis   DM (diabetes mellitus)   HTN (hypertension)   HIV (human immunodeficiency virus infection)   Sickle cell anemia   Sickle cell anemia with pain   Sickle cell pain crisis    Time spent:    [facility]  [facility] Pager [phone]. If 7PM-7AM,  please contact night-coverage at [email or web address], password [facility] 08/27/2014, 12:19 PM  LOS: 1 day

## 2014-08-27 NOTE — ED Provider Notes (Signed)
Medical screening examination/treatment/procedure(s) were conducted as a shared visit with non-physician practitioner(s) and myself.  I personally evaluated the patient during the encounter.  Pt w hx esrd/hd, sickle cell, c/o 'sickle cell pain' for the past few days in 'all joints', arms, legs and back. No fever or chills. No cp or sob. Chest cta. Labs. Iv pain control.    Suzi Roots, MD 08/27/14 [phone]

## 2014-08-28 DIAGNOSIS — Z21 Asymptomatic human immunodeficiency virus [HIV] infection status: Secondary | ICD-10-CM

## 2014-08-28 LAB — COMPREHENSIVE METABOLIC PANEL
ALT: 11 U/L (ref 0–53)
AST: 20 U/L (ref 0–37)
Albumin: 2.8 g/dL — ABNORMAL LOW (ref 3.5–5.2)
Alkaline Phosphatase: 154 U/L — ABNORMAL HIGH (ref 39–117)
Anion gap: 19 — ABNORMAL HIGH (ref 5–15)
BILIRUBIN TOTAL: 0.4 mg/dL (ref 0.3–1.2)
BUN: 45 mg/dL — ABNORMAL HIGH (ref 6–23)
CO2: 23 mEq/L (ref 19–32)
Calcium: 9 mg/dL (ref 8.4–10.5)
Chloride: 92 mEq/L — ABNORMAL LOW (ref 96–112)
Creatinine, Ser: 9.12 mg/dL — ABNORMAL HIGH (ref 0.50–1.35)
GFR calc Af Amer: 8 mL/min — ABNORMAL LOW (ref >90)
GFR, EST NON AFRICAN AMERICAN: 7 mL/min — AB (ref >90)
Glucose, Bld: 79 mg/dL (ref 70–99)
POTASSIUM: 4.7 meq/L (ref 3.7–5.3)
SODIUM: 134 meq/L — AB (ref 137–147)
Total Protein: 8.8 g/dL — ABNORMAL HIGH (ref 6.0–8.3)

## 2014-08-28 LAB — CBC
HCT: 24.4 % — ABNORMAL LOW (ref 39.0–52.0)
Hemoglobin: 7.7 g/dL — ABNORMAL LOW (ref 13.0–17.0)
MCH: 29.6 pg (ref 26.0–34.0)
MCHC: 31.6 g/dL (ref 30.0–36.0)
MCV: 93.8 fL (ref 78.0–100.0)
Platelets: 121 10*3/uL — ABNORMAL LOW (ref 150–400)
RBC: 2.6 MIL/uL — ABNORMAL LOW (ref 4.22–5.81)
RDW: 15.7 % — AB (ref 11.5–15.5)
WBC: 2.1 10*3/uL — ABNORMAL LOW (ref 4.0–10.5)

## 2014-08-28 LAB — HEMOGLOBIN A1C
Hgb A1c MFr Bld: 5.1 % (ref <5.7)
MEAN PLASMA GLUCOSE: 100 mg/dL (ref <117)

## 2014-08-28 LAB — T-HELPER CELLS (CD4) COUNT (NOT AT ARMC)
CD4 % Helper T Cell: 22 % — ABNORMAL LOW (ref 33–55)
CD4 T Cell Abs: 110 /uL — ABNORMAL LOW (ref 400–2700)

## 2014-08-28 LAB — LIPASE, BLOOD: Lipase: 100 U/L — ABNORMAL HIGH (ref 11–59)

## 2014-08-28 MED ORDER — HEPARIN SODIUM (PORCINE) 1000 UNIT/ML DIALYSIS
20.0000 [IU]/kg | INTRAMUSCULAR | Status: DC | PRN
  Administered 2014-08-28: 1900 [IU] via INTRAVENOUS_CENTRAL

## 2014-08-28 MED ORDER — HYDROMORPHONE HCL PF 1 MG/ML IJ SOLN
INTRAMUSCULAR | Status: AC
  Administered 2014-08-28: 1 mg via INTRAVENOUS
  Filled 2014-08-28: qty 1

## 2014-08-28 MED ORDER — CALCITRIOL 1 MCG/ML IV SOLN
INTRAVENOUS | Status: AC
  Filled 2014-08-28: qty 1

## 2014-08-28 MED ORDER — ONDANSETRON HCL 4 MG/2ML IJ SOLN
4.0000 mg | Freq: Four times a day (QID) | INTRAMUSCULAR | Status: DC | PRN
  Administered 2014-08-29: 4 mg via INTRAVENOUS
  Filled 2014-08-28: qty 2

## 2014-08-28 NOTE — Procedures (Signed)
Patient was seen on dialysis and the procedure was supervised.  BFR 400  Via AVF BP is  175/81.   Patient appears to be tolerating treatment well- UF of 5 liters  [name] A 08/28/2014

## 2014-08-28 NOTE — Progress Notes (Addendum)
[facility] PROGRESS NOTE  [name] ZOX:[id number] DOB: 13-Jun-1981 DOA: 08/26/2014 PCP: Pcp Not In System  Assessment/Plan: 1. Sickle cell pain crisis/anemia:  -doubt true crises -Hb appears to be at baseline, with reticulocyte count of 31, bili normal -continue folic acid, PRN dilaudid, hydrea on hold due to pancytopenia  2. End-stage renal disease:  -HD per Renal  3. HIV Disease -reportedly followed by ID clinic in [place], stopped taking HAART due to side effects, wants and plans to FU at [facility] ID clinic -CD4 and Viral load still pending-reordered  4. History of diabetes mellitus:  -CBGs 85-100 -hbaic 5.1  5. Nausea/abd pain/Chornic pancreatitis  -has h/o chronic pancreatitis, this has been chronic problem felt to be due to HIV and prior HAART, has been hospitalized numerous times from 2012 onwards at [facility], [facility] and [facility] with same, felt to have chronic pancreatitis and also drug seeking behavior suspected based on DC summaries -suspect same now -down grade diet to clears, supportive care, antiemetics/pain control, advance diet as tolerated -lipase,Cmet in am  6. Hypertension:  -continue lisinopril, PRN Hydralazine  7. Pancytopenia -chronic, due to HIV most likely, defer to ID at [facility]  8. Hyperkalemia -improved with HD   DVT Prophylaxis Heparin  Code Status: Full Code Family Communication: none at bedside Disposition Plan: home tomorrow after HD   Consultants:  Renal  HPI/Subjective: Still with nausea, reports vomiting 3-4times yesterday  Objective: Filed Vitals:   08/28/14 1130  BP: 148/71  Pulse: 92  Temp:   Resp: 18    Intake/Output Summary (Last 24 hours) at 08/28/14 1148 Last data filed at 08/27/14 1800  Gross per 24 hour  Intake    480 ml  Output   5000 ml  Net  -4520 ml   Filed Weights   08/27/14 1303 08/28/14 0500 08/28/14 0800  Weight: 88.1 kg (194 lb 3.6 oz) 87.9 kg (193 lb 12.6 oz) 89.7 kg (197 lb 12 oz)     Exam:   General:  AAOx3, no distress  Cardiovascular: S1S2/RRR  Respiratory: CTAB  Abdomen: soft, Nt, BS present  Musculoskeletal: trace edema   Data Reviewed: Basic Metabolic Panel:  Recent Labs Lab 08/23/14 0743 08/26/14 1400 08/27/14 0900 08/28/14 0500  NA 138 135* 134* 134*  K 4.5 4.9 6.0* 4.7  CL 92* 91* 91* 92*  CO2 GLUCOSE 93 85 93 79  BUN 44* 71* 81* 45*  CREATININE 8.19* 13.43* 14.26* 9.12*  CALCIUM 8.1* 8.4 8.2* 9.0   Liver Function Tests:  Recent Labs Lab 08/23/14 0743 08/26/14 1400 08/28/14 0500  AST ALT ALKPHOS 147* 147* 154*  BILITOT 0.4 0.4 0.4  PROT 9.0* 9.1* 8.8*  ALBUMIN 2.8* 3.0* 2.8*    Recent Labs Lab 08/23/14 0743 08/26/14 1400 08/28/14 0500  LIPASE 81* 77* 100*   No results found for this basename: AMMONIA,  in the last 168 hours CBC:  Recent Labs Lab 08/23/14 0743 08/26/14 1400 08/27/14 0900 08/28/14 0813  WBC 2.8* 2.3* 2.9* 2.1*  NEUTROABS 2.0 1.4*  --   --   HGB 7.9* 7.8* 7.9* 7.7*  HCT 25.2* 23.7* 24.8* 24.4*  MCV 94.7 90.1 90.5 93.8  PLT 116* 147* 138* 121*   Cardiac Enzymes: No results found for this basename: CKTOTAL, CKMB, CKMBINDEX, TROPONINI,  in the last 168 hours BNP (last 3 results) No results found for this basename: PROBNP,  in the last 8760 hours CBG: No results found  for this basename: GLUCAP,  in the last 168 hours  No results found for this or any previous visit (from the past 240 hour(s)).   Studies: No results found.  Scheduled Meds: . calcitRIOL  0.5 mcg Oral Q T,Th,Sa-HD  . calcium acetate  2,001 mg Oral TID WC  . [START ON 09/02/2014] darbepoetin (ARANESP) injection - DIALYSIS  100 mcg Intravenous Q Tue-HD  . ferric gluconate (FERRLECIT/NULECIT) IV  125 mg Intravenous Q T,Th,Sa-HD  . folic acid  1 mg Oral Daily  . heparin  5,000 Units Subcutaneous 3 times per day  . lisinopril  10 mg Oral Daily  . multivitamin  1 tablet Oral QHS   Continuous  Infusions:  Antibiotics Given (last 72 hours)   None      Active Problems:   ESRD on dialysis   DM (diabetes mellitus)   HTN (hypertension)   HIV (human immunodeficiency virus infection)   Sickle cell anemia   Sickle cell anemia with pain   Sickle cell pain crisis    Time spent:    [facility]  [facility] Pager [phone]. If 7PM-7AM, please contact night-coverage at [email or web address], password [facility] 08/28/2014, 11:48 AM  LOS: 2 days

## 2014-08-28 NOTE — Progress Notes (Signed)
Subjective:  Seen on HD- still c/o pain and nausea- done today to get back on schedule Objective Vital signs in last 24 hours: Filed Vitals:   08/28/14 0500 08/28/14 0800 08/28/14 0810 08/28/14 0830  BP: 131/56 186/79 165/67 157/81  Pulse: 84 80 77 79  Temp: 98.9 F (37.2 C) 98.4 F (36.9 C)    TempSrc: Oral Oral    Resp: Weight: 87.9 kg (193 lb 12.6 oz) 89.7 kg (197 lb 12 oz)    SpO2: 98% 98%     Weight change:   Intake/Output Summary (Last 24 hours) at 08/28/14 0981 Last data filed at 08/27/14 1800  Gross per 24 hour  Intake    480 ml  Output   5000 ml  Net  -4520 ml    Assessment/ Plan: Pt is a 33 y.o. yo male with ESRD who was admitted on 08/26/2014 with possible sickle cell crisis and no HD since 9/3  Assessment/Plan: 1. Sickle cell crisis- pain management per primary team - hydrea/dilaudid 2. ESRD - normally TTS at [facility]- via AVF- no HD since 9/3- done9/9  and today to get back on schedule. - this may help his nausea 3. Anemia- profound- maybe also due to HIV and sickle cell- continue iron and ESA 4. Secondary hyperparathyroidism- on oral calcitriol with HD will continue- also on phoslo 5. HTN/volume- needs volume removed- tells me his EDW is 61- will UF with HD- on lisinopril 10 as well 6. Nausea- possibly uremia vs other- on antiemetics- HD should help 7. HIV- on meds 8. Dispo- hopeful for discharge soon- will do HD again Saturday if still here.  Bulah Lurie A    Labs: Basic Metabolic Panel:  Recent Labs Lab 08/23/14 0743 08/26/14 1400 08/27/14 0900  NA 138 135* 134*  K 4.5 4.9 6.0*  CL 92* 91* 91*  CO2 GLUCOSE 93 85 93  BUN 44* 71* 81*  CREATININE 8.19* 13.43* 14.26*  CALCIUM 8.1* 8.4 8.2*   Liver Function Tests:  Recent Labs Lab 08/23/14 0743 08/26/14 1400  AST 16 16  ALT 8 8  ALKPHOS 147* 147*  BILITOT 0.4 0.4  PROT 9.0* 9.1*  ALBUMIN 2.8* 3.0*    Recent Labs Lab 08/23/14 0743 08/26/14 1400  LIPASE 81*  77*   No results found for this basename: AMMONIA,  in the last 168 hours CBC:  Recent Labs Lab 08/23/14 0743 08/26/14 1400 08/27/14 0900 08/28/14 0813  WBC 2.8* 2.3* 2.9* 2.1*  NEUTROABS 2.0 1.4*  --   --   HGB 7.9* 7.8* 7.9* 7.7*  HCT 25.2* 23.7* 24.8* 24.4*  MCV 94.7 90.1 90.5 93.8  PLT 116* 147* 138* 121*   Cardiac Enzymes: No results found for this basename: CKTOTAL, CKMB, CKMBINDEX, TROPONINI,  in the last 168 hours CBG: No results found for this basename: GLUCAP,  in the last 168 hours  Iron Studies: No results found for this basename: IRON, TIBC, TRANSFERRIN, FERRITIN,  in the last 72 hours Studies/Results: No results found. Medications: Infusions:    Scheduled Medications: . calcitRIOL  0.5 mcg Oral Q T,Th,Sa-HD  . calcium acetate  2,001 mg Oral TID WC  . [START ON 09/02/2014] darbepoetin (ARANESP) injection - DIALYSIS  100 mcg Intravenous Q Tue-HD  . ferric gluconate (FERRLECIT/NULECIT) IV  125 mg Intravenous Q T,Th,Sa-HD  . folic acid  1 mg Oral Daily  . heparin  5,000 Units Subcutaneous 3 times per day  . lisinopril  10 mg  Oral Daily  . multivitamin  1 tablet Oral QHS    have reviewed scheduled and prn medications.  Physical Exam: General: NAD Heart: RRR Lungs: mostly clear Abdomen: soft, non tender, non distended  Extremities: has edema Dialysis Access: AVF right upper arm    08/28/2014,9:04 AM  LOS: 2 days

## 2014-08-29 DIAGNOSIS — I10 Essential (primary) hypertension: Secondary | ICD-10-CM

## 2014-08-29 LAB — HIV-1 RNA QUANT-NO REFLEX-BLD
HIV 1 RNA QUANT: 28818 {copies}/mL — AB (ref <20)
HIV-1 RNA Quant, Log: 4.46 {Log} — ABNORMAL HIGH (ref <1.30)

## 2014-08-29 LAB — T-HELPER CELLS (CD4) COUNT (NOT AT ARMC)
CD4 % Helper T Cell: 24 % — ABNORMAL LOW (ref 33–55)
CD4 T Cell Abs: 100 /uL — ABNORMAL LOW (ref 400–2700)

## 2014-08-29 NOTE — Discharge Summary (Addendum)
Physician Discharge Summary  [name] ZOX:[id number] DOB: 07-Dec-1981 DOA: 08/26/2014  PCP: Pcp Not In System  Admit date: 08/26/2014 Discharge date: 08/30/2014  Time spent: 45 minutes  Recommendations for Outpatient Follow-up:  1. Hematologist in 1 week, could resume Hydroxyurea when pancytopenia improved 2. ID clinic in [facility]   Discharge Diagnoses:  Active Problems:   ESRD on dialysis   DM (diabetes mellitus)   HTN (hypertension)   HIV (human immunodeficiency virus infection)   Sickle cell anemia with pain   Sickle cell pain crisis   Pancytopenia   HTN   Non compliance  Discharge Condition: stable  Diet recommendation: Renal  Filed Weights   08/28/14 2100 08/29/14 1938 08/30/14 0741  Weight: 88.5 kg (195 lb 1.7 oz) 88.42 kg (194 lb 14.9 oz) 84.7 kg (186 lb 11.7 oz)    History of present illness:  Hayk Divis is a 33 y.o. male, with known history of sickle cell disease, HIV, end-stage renal disease on hemodialysis Tuesday Thursday Saturday, he gets his hemodialysis at the [place] with [facility], patient presents with complaints of worsening diffuse joint pain and he states all over, and reported that his symptoms resembled his sickle cell crisis, reported last time he was admitted for sickle cell crisis was last year, patient reported he has not been taking his HIV medications for a few months now, as he has not been feeling well when he was taking them, he does not know when washes his CD4 count or viral load as it has not been checked for a long time, as he did not see his infectious disease doctor in [place] for few month.  Hospital Course:  1. Sickle cell pain crisis/anemia:  -doubt true crises  -Hb appears to be at baseline, with reticulocyte count of 31, bili normal  -improved with folic acid, PRN dilaudid, hydrea on hold due to pancytopenia   2. End-stage renal disease:  -HD per Renal   3. HIV Disease  -reportedly followed by ID  clinic in [place], stopped taking HAART due to side effects, wants and plans to FU at [facility] ID clinic  -CD4 count is 100, Viral load pending   4. History of diabetes mellitus:  -CBGs 85-100  -hbaic 5.1   5. Nausea/abd pain/Chornic pancreatitis  -has h/o chronic pancreatitis, this has been chronic problem felt to be due to HIV and prior HAART, has been hospitalized numerous times from 2012 onwards at [facility] and [facility] with same, felt to have chronic pancreatitis and also drug seeking behavior suspected based on DC summaries  -suspect same now  -treated with supportive care, diet advanced from clears to bland diet by the time of discharge  6. Hypertension:  -continue lisinopril,  7. Pancytopenia  -chronic, due to HIV most likely, defer to ID at [facility]   8. Hyperkalemia  -improved with HD      Consultations:  Renal  Discharge Exam: Filed Vitals:   08/30/14 0900  BP: 135/80  Pulse: 84  Temp:   Resp: 18    General: AAOx3 Cardiovascular: S1S2/RRR Respiratory: CTAB  Discharge Instructions You were cared for by a hospitalist during your hospital stay. If you have any questions about your discharge medications or the care you received while you were in the hospital after you are discharged, you can call the unit and asked to speak with the hospitalist on call if the hospitalist that took care of you is not available. Once you are discharged, your primary care physician will  handle any further medical issues. Please note that NO REFILLS for any discharge medications will be authorized once you are discharged, as it is imperative that you return to your primary care physician (or establish a relationship with a primary care physician if you do not have one) for your aftercare needs so that they can reassess your need for medications and monitor your lab values.  Discharge Instructions   Discharge instructions    Complete by:  As directed   Renal diet     Increase activity slowly     Complete by:  As directed           Current Discharge Medication List    START taking these medications   Details  polyethylene glycol (MIRALAX / GLYCOLAX) packet Take 17 g by mouth daily as needed for mild constipation. Qty: 14 each, Refills: 0      CONTINUE these medications which have NOT CHANGED   Details  b complex-vitamin c-folic acid (NEPHRO-VITE) 0.8 MG TABS tablet Take 1 tablet by mouth at bedtime.    calcium acetate (PHOSLO) 667 MG capsule Take 2,001 mg by mouth 3 (three) times daily with meals.    diphenhydrAMINE (BENADRYL) 50 MG capsule Take 50 mg by mouth every 6 (six) hours as needed for itching or allergies.    folic acid (FOLVITE) 1 MG tablet Take 1 mg by mouth daily.    HYDROmorphone (DILAUDID) 8 MG tablet Take 8 mg by mouth every 4 (four) hours as needed for severe pain.    lisinopril (PRINIVIL,ZESTRIL) 10 MG tablet Take 10 mg by mouth daily.      STOP taking these medications     hydroxyurea (HYDREA) 500 MG capsule        No Known Allergies Follow-up Information   Follow up with ID clinic in [place]. Schedule an appointment as soon as possible for a visit in 2 weeks.      Follow up with [facility]. Schedule an appointment as soon as possible for a visit in 1 month.       The results of significant diagnostics from this hospitalization (including imaging, microbiology, ancillary and laboratory) are listed below for reference.    Significant Diagnostic Studies: Dg Chest 2 View  07/31/2014   CLINICAL DATA:  Chest pain.  Possible sickle cell crisis.  EXAM: CHEST  2 VIEW  COMPARISON:  Chest x-ray 03/11/2012.  FINDINGS: Diffusely coarsened interstitial markings appear similar to the prior examination and are likely chronic. No consolidative airspace disease. No pleural effusions. No evidence of pulmonary edema. Heart size is mildly enlarged. Upper mediastinal contours are within normal limits. Vascular stents are noted in the right subclavian  region and in the left axillary and subclavian regions.  IMPRESSION: 1. Diffuse coarsening of interstitial markings, similar to the prior examination, likely chronic. 2. No focal airspace consolidation to suggest acute chest syndrome at this time. 3. Mild cardiomegaly.   Electronically Signed   By: Trudie Reed M.D.   On: 07/31/2014 21:36    Microbiology: No results found for this or any previous visit (from the past 240 hour(s)).   Labs: Basic Metabolic Panel:  Recent Labs Lab 08/26/14 1400 08/27/14 0900 08/28/14 0500  NA 135* 134* 134*  K 4.9 6.0* 4.7  CL 91* 91* 92*  CO2 GLUCOSE 85 93 79  BUN 71* 81* 45*  CREATININE 13.43* 14.26* 9.12*  CALCIUM 8.4 8.2* 9.0   Liver Function Tests:  Recent Labs Lab  08/26/14 1400 08/28/14 0500  AST 16 20  ALT 8 11  ALKPHOS 147* 154*  BILITOT 0.4 0.4  PROT 9.1* 8.8*  ALBUMIN 3.0* 2.8*    Recent Labs Lab 08/26/14 1400 08/28/14 0500  LIPASE 77* 100*   No results found for this basename: AMMONIA,  in the last 168 hours CBC:  Recent Labs Lab 08/26/14 1400 08/27/14 0900 08/28/14 0813  WBC 2.3* 2.9* 2.1*  NEUTROABS 1.4*  --   --   HGB 7.8* 7.9* 7.7*  HCT 23.7* 24.8* 24.4*  MCV 90.1 90.5 93.8  PLT 147* 138* 121*   Cardiac Enzymes: No results found for this basename: CKTOTAL, CKMB, CKMBINDEX, TROPONINI,  in the last 168 hours BNP: BNP (last 3 results) No results found for this basename: PROBNP,  in the last 8760 hours CBG: No results found for this basename: GLUCAP,  in the last 168 hours     Signed:  Chaselyn Nanney  [facility] 08/30/2014, 9:11 AM

## 2014-08-29 NOTE — Progress Notes (Signed)
Subjective:  Plan per hospitalist is to have him be discharged tomorrow after HD Objective Vital signs in last 24 hours: Filed Vitals:   08/28/14 1813 08/28/14 2100 08/29/14 0500 08/29/14 0929  BP: 117/57 136/82 127/71 115/63  Pulse: 86 107 90 89  Temp: 99.3 F (37.4 C) 98.9 F (37.2 C) 98.4 F (36.9 C) 98.5 F (36.9 C)  TempSrc: Oral Oral Oral Oral  Resp: Weight:  88.5 kg (195 lb 1.7 oz)    SpO2: 95% 98% 98% 97%   Weight change: -3.7 kg (-8 lb 2.5 oz) No intake or output data in the 24 hours ending 08/29/14 1238  Assessment/ Plan: Pt is a 33 y.o. yo male with ESRD who was admitted on 08/26/2014 with possible sickle cell crisis and no HD since 9/3  Assessment/Plan: 1. Sickle cell crisis- pain management per primary team - hydrea/dilaudid 2. ESRD - normally TTS at [facility]- via AVF- no HD since 9/3- done9/9  and 9/10 to get back on schedule. - this may help his nausea- next due tomorrow 3. Anemia- profound- maybe also due to HIV and sickle cell- continue iron and ESA 4. Secondary hyperparathyroidism- on oral calcitriol with HD will continue- also on phoslo 5. HTN/volume- needs volume removed- tells me his EDW is 102- will UF with HD- on lisinopril 10 as well- seems better 6. Nausea- possibly uremia vs other- on antiemetics- HD should help 7. HIV- on meds 8. Dispo- discharge slated for tomorrow after HD- will do first shift  Nikaya Nasby A    Labs: Basic Metabolic Panel:  Recent Labs Lab 08/26/14 1400 08/27/14 0900 08/28/14 0500  NA 135* 134* 134*  K 4.9 6.0* 4.7  CL 91* 91* 92*  CO2 GLUCOSE 85 93 79  BUN 71* 81* 45*  CREATININE 13.43* 14.26* 9.12*  CALCIUM 8.4 8.2* 9.0   Liver Function Tests:  Recent Labs Lab 08/23/14 0743 08/26/14 1400 08/28/14 0500  AST ALT ALKPHOS 147* 147* 154*  BILITOT 0.4 0.4 0.4  PROT 9.0* 9.1* 8.8*  ALBUMIN 2.8* 3.0* 2.8*    Recent Labs Lab 08/23/14 0743 08/26/14 1400  08/28/14 0500  LIPASE 81* 77* 100*   No results found for this basename: AMMONIA,  in the last 168 hours CBC:  Recent Labs Lab 08/23/14 0743 08/26/14 1400 08/27/14 0900 08/28/14 0813  WBC 2.8* 2.3* 2.9* 2.1*  NEUTROABS 2.0 1.4*  --   --   HGB 7.9* 7.8* 7.9* 7.7*  HCT 25.2* 23.7* 24.8* 24.4*  MCV 94.7 90.1 90.5 93.8  PLT 116* 147* 138* 121*   Cardiac Enzymes: No results found for this basename: CKTOTAL, CKMB, CKMBINDEX, TROPONINI,  in the last 168 hours CBG: No results found for this basename: GLUCAP,  in the last 168 hours  Iron Studies: No results found for this basename: IRON, TIBC, TRANSFERRIN, FERRITIN,  in the last 72 hours Studies/Results: No results found. Medications: Infusions:    Scheduled Medications: . calcitRIOL  0.5 mcg Oral Q T,Th,Sa-HD  . calcium acetate  2,001 mg Oral TID WC  . [START ON 09/02/2014] darbepoetin (ARANESP) injection - DIALYSIS  100 mcg Intravenous Q Tue-HD  . ferric gluconate (FERRLECIT/NULECIT) IV  125 mg Intravenous Q T,Th,Sa-HD  . folic acid  1 mg Oral Daily  . heparin  5,000 Units Subcutaneous 3 times per day  . lisinopril  10 mg Oral Daily  . multivitamin  1 tablet Oral QHS  have reviewed scheduled and prn medications.  Physical Exam: General: NAD Heart: RRR Lungs: mostly clear Abdomen: soft, non tender, non distended  Extremities: has edema Dialysis Access: AVF right upper arm    08/29/2014,12:38 PM  LOS: 3 days

## 2014-08-30 MED ORDER — POLYETHYLENE GLYCOL 3350 17 G PO PACK: 17.0000 g | PACK | Freq: Every day | ORAL | Status: DC | PRN

## 2014-08-30 MED ORDER — HEPARIN SODIUM (PORCINE) 1000 UNIT/ML DIALYSIS
20.0000 [IU]/kg | INTRAMUSCULAR | Status: DC | PRN
  Administered 2014-08-30: 1800 [IU] via INTRAVENOUS_CENTRAL
  Filled 2014-08-30: qty 2

## 2014-08-30 MED ORDER — HYDROMORPHONE HCL PF 1 MG/ML IJ SOLN
INTRAMUSCULAR | Status: AC
  Administered 2014-08-30: 1 mg via INTRAVENOUS
  Filled 2014-08-30: qty 1

## 2014-08-30 NOTE — Progress Notes (Signed)
Subjective:  Plan per hospitalist is to have him be discharged today- he says his home unit already knows he will be back Tuesday Objective Vital signs in last 24 hours: Filed Vitals:   08/30/14 0753 08/30/14 0756 08/30/14 0816 08/30/14 0900  BP: 154/87 164/86 149/80 135/80  Pulse: 82 81 75 84  Temp:      TempSrc:      Resp: Height:      Weight:      SpO2:       Weight change: -1.28 kg (-2 lb 13.2 oz)  Intake/Output Summary (Last 24 hours) at 08/30/14 0920 Last data filed at 08/30/14 0025  Gross per 24 hour  Intake    480 ml  Output      0 ml  Net    480 ml    Assessment/ Plan: Pt is a 33 y.o. yo male with ESRD who was admitted on 08/26/2014 with possible sickle cell crisis and no HD since 9/3  Assessment/Plan: 1. Sickle cell crisis- pain management per primary team - hydrea/dilaudid 2. ESRD - normally TTS at [facility]- via AVF- no HD since 9/3- done  9/9 and 9/10  and today to get back on schedule. -  3. Anemia- profound- maybe also due to HIV and sickle cell- continue iron and ESA 4. Secondary hyperparathyroidism- on oral calcitriol with HD will continue- also on phoslo 5. HTN/volume- needs volume removed- tells me his EDW is 19- will UF with HD- on lisinopril 10 as well- seems better 6. Nausea- possibly uremia vs other- on antiemetics- HD should help 7. HIV- on meds 8. Dispo- discharge slated for today- he says his home unit is already aware of his discharge and that he will be back there on Tuesday   [name] A    Labs: Basic Metabolic Panel:  Recent Labs Lab 08/26/14 1400 08/27/14 0900 08/28/14 0500  NA 135* 134* 134*  K 4.9 6.0* 4.7  CL 91* 91* 92*  CO2 GLUCOSE 85 93 79  BUN 71* 81* 45*  CREATININE 13.43* 14.26* 9.12*  CALCIUM 8.4 8.2* 9.0   Liver Function Tests:  Recent Labs Lab 08/26/14 1400 08/28/14 0500  AST 16 20  ALT 8 11  ALKPHOS 147* 154*  BILITOT 0.4 0.4  PROT 9.1* 8.8*  ALBUMIN 3.0* 2.8*    Recent  Labs Lab 08/26/14 1400 08/28/14 0500  LIPASE 77* 100*   No results found for this basename: AMMONIA,  in the last 168 hours CBC:  Recent Labs Lab 08/26/14 1400 08/27/14 0900 08/28/14 0813  WBC 2.3* 2.9* 2.1*  NEUTROABS 1.4*  --   --   HGB 7.8* 7.9* 7.7*  HCT 23.7* 24.8* 24.4*  MCV 90.1 90.5 93.8  PLT 147* 138* 121*   Cardiac Enzymes: No results found for this basename: CKTOTAL, CKMB, CKMBINDEX, TROPONINI,  in the last 168 hours CBG: No results found for this basename: GLUCAP,  in the last 168 hours  Iron Studies: No results found for this basename: IRON, TIBC, TRANSFERRIN, FERRITIN,  in the last 72 hours Studies/Results: No results found. Medications: Infusions:    Scheduled Medications: . calcitRIOL  0.5 mcg Oral Q T,Th,Sa-HD  . calcium acetate  2,001 mg Oral TID WC  . [START ON 09/02/2014] darbepoetin (ARANESP) injection - DIALYSIS  100 mcg Intravenous Q Tue-HD  . ferric gluconate (FERRLECIT/NULECIT) IV  125 mg Intravenous Q T,Th,Sa-HD  . folic acid  1 mg Oral Daily  . heparin  5,000 Units Subcutaneous 3 times per day  . lisinopril  10 mg Oral Daily  . multivitamin  1 tablet Oral QHS    have reviewed scheduled and prn medications.  Physical Exam: General: NAD Heart: RRR Lungs: mostly clear Abdomen: soft, non tender, non distended  Extremities: has edema Dialysis Access: AVF right upper arm    08/30/2014,9:20 AM  LOS: 4 days

## 2014-08-30 NOTE — Progress Notes (Signed)
Discharge education completed by RN. Pt received a copy of discharge paperwork and confirms understanding of follow up appointments and discharge medications. Pt denies any questions at this time. IV removed, site is within normal limits. Pt will discharge from the unit via wheelchair. 

## 2014-08-30 NOTE — Procedures (Signed)
Patient was seen on dialysis and the procedure was supervised.  BFR 400  Via AVF BP is  135/80.   Patient appears to be tolerating treatment well  Quinisha Mould A 08/30/2014

## 2014-08-31 ENCOUNTER — Emergency Department: Payer: Self-pay | Admitting: Emergency Medicine

## 2014-08-31 LAB — RETICULOCYTES
Absolute Retic Count: 0.0539 10*6/uL (ref 0.019–0.186)
Reticulocyte: 1.82 % (ref 0.4–3.1)

## 2014-08-31 LAB — CBC WITH DIFFERENTIAL/PLATELET
Basophil #: 0 10*3/uL (ref 0.0–0.1)
Basophil %: 1.3 %
EOS ABS: 0.1 10*3/uL (ref 0.0–0.7)
EOS PCT: 3 %
HCT: 27.7 % — AB (ref 40.0–52.0)
HGB: 8.8 g/dL — ABNORMAL LOW (ref 13.0–18.0)
Lymphocyte #: 0.5 10*3/uL — ABNORMAL LOW (ref 1.0–3.6)
Lymphocyte %: 23.1 %
MCH: 29.6 pg (ref 26.0–34.0)
MCHC: 31.6 g/dL — ABNORMAL LOW (ref 32.0–36.0)
MCV: 94 fL (ref 80–100)
MONO ABS: 0.3 x10 3/mm (ref 0.2–1.0)
MONOS PCT: 12.3 %
NEUTROS ABS: 1.4 10*3/uL (ref 1.4–6.5)
Neutrophil %: 60.3 %
PLATELETS: 121 10*3/uL — AB (ref 150–440)
RBC: 2.97 10*6/uL — ABNORMAL LOW (ref 4.40–5.90)
RDW: 16.5 % — ABNORMAL HIGH (ref 11.5–14.5)
WBC: 2.3 10*3/uL — AB (ref 3.8–10.6)

## 2014-08-31 LAB — COMPREHENSIVE METABOLIC PANEL
ALT: 14 U/L
AST: 31 U/L (ref 15–37)
Albumin: 3 g/dL — ABNORMAL LOW (ref 3.4–5.0)
Alkaline Phosphatase: 169 U/L — ABNORMAL HIGH
Anion Gap: 5 — ABNORMAL LOW (ref 7–16)
BUN: 36 mg/dL — ABNORMAL HIGH (ref 7–18)
Bilirubin,Total: 0.4 mg/dL (ref 0.2–1.0)
CHLORIDE: 103 mmol/L (ref 98–107)
CO2: 25 mmol/L (ref 21–32)
CREATININE: 6.84 mg/dL — AB (ref 0.60–1.30)
Calcium, Total: 8.9 mg/dL (ref 8.5–10.1)
EGFR (African American): 11 — ABNORMAL LOW
GFR CALC NON AF AMER: 10 — AB
Glucose: 62 mg/dL — ABNORMAL LOW (ref 65–99)
Osmolality: 273 (ref 275–301)
Potassium: 3.7 mmol/L (ref 3.5–5.1)
Sodium: 133 mmol/L — ABNORMAL LOW (ref 136–145)
Total Protein: 10.2 g/dL — ABNORMAL HIGH (ref 6.4–8.2)

## 2014-08-31 LAB — LIPASE, BLOOD: Lipase: 919 U/L — ABNORMAL HIGH (ref 73–393)

## 2014-09-07 ENCOUNTER — Encounter (HOSPITAL_COMMUNITY): Payer: Self-pay | Admitting: Emergency Medicine

## 2014-09-07 ENCOUNTER — Emergency Department (HOSPITAL_COMMUNITY)
Admission: EM | Admit: 2014-09-07 | Discharge: 2014-09-07 | Disposition: A | Discharge status: Home / Self Care | Payer: Medicare Other | Attending: Emergency Medicine | Admitting: Emergency Medicine

## 2014-09-07 ENCOUNTER — Emergency Department: Payer: Self-pay | Admitting: Emergency Medicine

## 2014-09-07 DIAGNOSIS — F17200 Nicotine dependence, unspecified, uncomplicated: Secondary | ICD-10-CM | POA: Insufficient documentation

## 2014-09-07 DIAGNOSIS — I120 Hypertensive chronic kidney disease with stage 5 chronic kidney disease or end stage renal disease: Secondary | ICD-10-CM | POA: Diagnosis not present

## 2014-09-07 DIAGNOSIS — Z79899 Other long term (current) drug therapy: Secondary | ICD-10-CM | POA: Insufficient documentation

## 2014-09-07 DIAGNOSIS — Z8719 Personal history of other diseases of the digestive system: Secondary | ICD-10-CM | POA: Insufficient documentation

## 2014-09-07 DIAGNOSIS — Z992 Dependence on renal dialysis: Secondary | ICD-10-CM | POA: Diagnosis not present

## 2014-09-07 DIAGNOSIS — N186 End stage renal disease: Secondary | ICD-10-CM | POA: Diagnosis not present

## 2014-09-07 DIAGNOSIS — Z21 Asymptomatic human immunodeficiency virus [HIV] infection status: Secondary | ICD-10-CM | POA: Insufficient documentation

## 2014-09-07 DIAGNOSIS — D5700 Hb-SS disease with crisis, unspecified: Secondary | ICD-10-CM | POA: Insufficient documentation

## 2014-09-07 LAB — CBC
HCT: 23.6 % — ABNORMAL LOW (ref 39.0–52.0)
HCT: 25.2 % — AB (ref 40.0–52.0)
HGB: 7.9 g/dL — AB (ref 13.0–18.0)
Hemoglobin: 7.7 g/dL — ABNORMAL LOW (ref 13.0–17.0)
MCH: 29.8 pg (ref 26.0–34.0)
MCH: 29.1 pg (ref 26.0–34.0)
MCHC: 32.6 g/dL (ref 30.0–36.0)
MCHC: 31.3 g/dL — ABNORMAL LOW (ref 32.0–36.0)
MCV: 91.5 fL (ref 78.0–100.0)
MCV: 93 fL (ref 80–100)
PLATELETS: 101 10*3/uL — AB (ref 150–400)
Platelet: 97 10*3/uL — ABNORMAL LOW (ref 150–440)
RBC: 2.58 MIL/uL — AB (ref 4.22–5.81)
RBC: 2.71 10*6/uL — ABNORMAL LOW (ref 4.40–5.90)
RDW: 15.3 % (ref 11.5–15.5)
RDW: 16.2 % — ABNORMAL HIGH (ref 11.5–14.5)
WBC: 2.6 10*3/uL — AB (ref 4.0–10.5)
WBC: 2.4 10*3/uL — AB (ref 3.8–10.6)

## 2014-09-07 LAB — COMPREHENSIVE METABOLIC PANEL
ALBUMIN: 2.9 g/dL — AB (ref 3.4–5.0)
ALT: 13 U/L (ref 0–53)
ALT: 20 U/L
ANION GAP: 11 (ref 7–16)
AST: 20 U/L (ref 0–37)
Albumin: 2.7 g/dL — ABNORMAL LOW (ref 3.5–5.2)
Alkaline Phosphatase: 176 U/L — ABNORMAL HIGH (ref 39–117)
Alkaline Phosphatase: 193 U/L — ABNORMAL HIGH
Anion gap: 19 — ABNORMAL HIGH (ref 5–15)
BILIRUBIN TOTAL: 0.4 mg/dL (ref 0.2–1.0)
BUN: 54 mg/dL — ABNORMAL HIGH (ref 6–23)
BUN: 60 mg/dL — ABNORMAL HIGH (ref 7–18)
CO2: 25 mEq/L (ref 19–32)
CREATININE: 10.91 mg/dL — AB (ref 0.60–1.30)
Calcium, Total: 7.8 mg/dL — ABNORMAL LOW (ref 8.5–10.1)
Calcium: 8.4 mg/dL (ref 8.4–10.5)
Chloride: 95 mEq/L — ABNORMAL LOW (ref 96–112)
Chloride: 96 mmol/L — ABNORMAL LOW (ref 98–107)
Co2: 28 mmol/L (ref 21–32)
Creatinine, Ser: 9.98 mg/dL — ABNORMAL HIGH (ref 0.50–1.35)
EGFR (African American): 6 — ABNORMAL LOW
GFR CALC NON AF AMER: 5 — AB
GFR calc Af Amer: 7 mL/min — ABNORMAL LOW (ref >90)
GFR calc non Af Amer: 6 mL/min — ABNORMAL LOW (ref >90)
GLUCOSE: 94 mg/dL (ref 65–99)
Glucose, Bld: 139 mg/dL — ABNORMAL HIGH (ref 70–99)
OSMOLALITY: 287 (ref 275–301)
Potassium: 4.6 mEq/L (ref 3.7–5.3)
Potassium: 4.5 mmol/L (ref 3.5–5.1)
SGOT(AST): 27 U/L (ref 15–37)
SODIUM: 139 meq/L (ref 137–147)
Sodium: 135 mmol/L — ABNORMAL LOW (ref 136–145)
TOTAL PROTEIN: 8.5 g/dL — AB (ref 6.0–8.3)
Total Bilirubin: 0.4 mg/dL (ref 0.3–1.2)
Total Protein: 9.2 g/dL — ABNORMAL HIGH (ref 6.4–8.2)

## 2014-09-07 LAB — RETICULOCYTES
Absolute Retic Count: 0.0137 10*6/uL — ABNORMAL LOW (ref 0.019–0.186)
RBC.: 2.58 MIL/uL — AB (ref 4.22–5.81)
RETICULOCYTE: 0.51 % (ref 0.4–3.1)
Retic Count, Absolute: 15.5 10*3/uL — ABNORMAL LOW (ref 19.0–186.0)
Retic Ct Pct: 0.6 % (ref 0.4–3.1)

## 2014-09-07 LAB — TROPONIN I: Troponin-I: <0.02 ng/mL

## 2014-09-07 MED ORDER — SODIUM CHLORIDE 0.9 % IV BOLUS (SEPSIS)
250.0000 mL | Freq: Once | INTRAVENOUS | Status: AC
  Administered 2014-09-07: 250 mL via INTRAVENOUS

## 2014-09-07 MED ORDER — HYDROMORPHONE HCL 1 MG/ML IJ SOLN
2.0000 mg | Freq: Once | INTRAMUSCULAR | Status: AC
  Administered 2014-09-07: 2 mg via INTRAVENOUS
  Filled 2014-09-07: qty 2

## 2014-09-07 MED ORDER — PROMETHAZINE HCL 25 MG RE SUPP: 25.0000 mg | Freq: Four times a day (QID) | RECTAL | Status: DC | PRN

## 2014-09-07 MED ORDER — PROMETHAZINE HCL 25 MG/ML IJ SOLN
25.0000 mg | Freq: Once | INTRAMUSCULAR | Status: AC
  Administered 2014-09-07: 25 mg via INTRAVENOUS
  Filled 2014-09-07: qty 1

## 2014-09-07 MED ORDER — ONDANSETRON HCL 4 MG/2ML IJ SOLN
4.0000 mg | Freq: Once | INTRAMUSCULAR | Status: AC
  Administered 2014-09-07: 4 mg via INTRAVENOUS
  Filled 2014-09-07: qty 2

## 2014-09-07 NOTE — ED Provider Notes (Signed)
CSN: [id number]     Arrival date & time 09/07/14  1033 History   First MD Initiated Contact with Patient 09/07/14 1039     Chief Complaint  Patient presents with  . Sickle Cell Pain Crisis     (Consider location/radiation/quality/duration/timing/severity/associated sxs/prior Treatment) Patient is a 33 y.o. male presenting with sickle cell pain. The history is provided by the patient.  Sickle Cell Pain Crisis Associated symptoms: vomiting   Associated symptoms: no chest pain, no cough, no fever, no headaches, no shortness of breath and no sore throat   pt with hx sickle cell disease, esrd/hd, c/o pain in back diffusely and in all joints c/w his prior sickle cell pain. States pain constant, persistent for the past couple days, moderate-severe without specific exacerbating or allev factors. . States he couldn't keep his po dilaudid down at home today due to nv. Emesis clear, not bloody or bilious. No abd pain or distension. No diarrhea or constipation.  States had normal dialyses yesterday. Denies cough or uri c/o. No fever or chills. No cp or discomfort.      Past Medical History  Diagnosis Date  . Sickle cell anemia     States Dx at 33 yo, s/p multiple prior transfusions, typically with pain in legs, back  . HIV (human immunodeficiency virus infection) 2003    Followed by Harless Nakayama at [facility], [place]   . HTN (hypertension)   . Pulmonary mass     Unclear history. States PCP was following a pulmonary mass with serial imaging  . Pancreatitis 2012    Denies any alcohol use   . Hypertension   . Blood transfusion     "several"  . End stage renal disease on dialysis HD in 2009     Followed by Dr. Buelah Manis in [place]. States due to HTN/DM.   Marland Kitchen ESRD (end stage renal disease) on dialysis     "[facility]; [place], [place]; TTS" (08/26/2014)  . Renal failure    Past Surgical History  Procedure Laterality Date  . Dialysis fistula creation Left     "arm"  . Hip fracture surgery  Right     "screw placed into joint"  . Av fistula placement Left     "arm"   Family History  Problem Relation Age of Onset  . Hypertension Father   . Diabetes      Uncles and grandfather  . Cervical cancer Mother     Dec 46yo of cervical ca   History  Substance Use Topics  . Smoking status: Current Every Day Smoker -- 0.12 packs/day for 18 years    Types: Cigarettes  . Smokeless tobacco: Never Used  . Alcohol Use: No    Review of Systems  Constitutional: Negative for fever and chills.  HENT: Negative for sore throat.   Eyes: Negative for redness.  Respiratory: Negative for cough and shortness of breath.   Cardiovascular: Negative for chest pain and leg swelling.  Gastrointestinal: Positive for vomiting. Negative for abdominal pain and diarrhea.  Genitourinary: Negative for flank pain.  Musculoskeletal: Positive for arthralgias and back pain. Negative for neck pain.  Skin: Negative for rash.  Neurological: Negative for headaches.  Hematological: Does not bruise/bleed easily.  Psychiatric/Behavioral: Negative for confusion.      Allergies  Review of patient's allergies indicates no known allergies.  Home Medications   Prior to Admission medications   Medication Sig Start Date End Date Taking? Authorizing Provider  b complex-vitamin c-folic acid (NEPHRO-VITE) 0.8 MG TABS  tablet Take 1 tablet by mouth at bedtime.    Historical Provider, MD  calcium acetate (PHOSLO) 667 MG capsule Take 2,001 mg by mouth 3 (three) times daily with meals.    Historical Provider, MD  diphenhydrAMINE (BENADRYL) 50 MG capsule Take 50 mg by mouth every 6 (six) hours as needed for itching or allergies.    Historical Provider, MD  folic acid (FOLVITE) 1 MG tablet Take 1 mg by mouth daily.    Historical Provider, MD  HYDROmorphone (DILAUDID) 8 MG tablet Take 8 mg by mouth every 4 (four) hours as needed for severe pain.    Historical Provider, MD  lisinopril (PRINIVIL,ZESTRIL) 10 MG tablet Take 10  mg by mouth daily.    Historical Provider, MD  polyethylene glycol (MIRALAX / GLYCOLAX) packet Take 17 g by mouth daily as needed for mild constipation. 08/30/14   Zannie Cove, MD   BP 116/85  Pulse 112  Temp(Src) 98.4 F (36.9 C) (Oral)  Resp 20  Ht  (1.702 m)  Wt 180 lb (81.647 kg)  BMI 28.19 kg/m2  SpO2 96% Physical Exam  Nursing note and vitals reviewed. Constitutional: He is oriented to person, place, and time. He appears well-developed and well-nourished. No distress.  HENT:  Head: Atraumatic.  Mouth/Throat: Oropharynx is clear and moist.  Eyes: Conjunctivae are normal. No scleral icterus.  Neck: Normal range of motion. Neck supple. No tracheal deviation present.  Cardiovascular: Normal rate, regular rhythm, normal heart sounds and intact distal pulses.   Pulmonary/Chest: Effort normal and breath sounds normal. No accessory muscle usage. No respiratory distress.  Abdominal: Soft. Bowel sounds are normal. He exhibits no distension and no mass. There is no tenderness. There is no rebound and no guarding.  No hernia  Genitourinary:  No cva tenderness.   Musculoskeletal: Normal range of motion.  Spine non tender. Good rom bil ext without pain. Distal pulses palp. No swelling. LUE forearm fist w palp thrill.   Neurological: He is alert and oriented to person, place, and time.  Skin: Skin is warm and dry. No rash noted. He is not diaphoretic.  Psychiatric: He has a normal mood and affect.    ED Course  Procedures (including critical care time) Labs Review  Results for orders placed during the hospital encounter of 09/07/14  CBC      Result Value Ref Range   WBC 2.6 (*) 4.0 - 10.5 K/uL   RBC 2.58 (*) 4.22 - 5.81 MIL/uL   Hemoglobin 7.7 (*) 13.0 - 17.0 g/dL   HCT 04.5 (*) 40.9 - 81.1 %   MCV 91.5  78.0 - 100.0 fL   MCH 29.8  26.0 - 34.0 pg   MCHC 32.6  30.0 - 36.0 g/dL   RDW 91.4  78.2 - 95.6 %   Platelets PENDING  150 - 400 K/uL  COMPREHENSIVE METABOLIC PANEL       Result Value Ref Range   Sodium 139  137 - 147 mEq/L   Potassium 4.6  3.7 - 5.3 mEq/L   Chloride 95 (*) 96 - 112 mEq/L   CO2 25  19 - 32 mEq/L   Glucose, Bld 139 (*) 70 - 99 mg/dL   BUN 54 (*) 6 - 23 mg/dL   Creatinine, Ser 2.13 (*) 0.50 - 1.35 mg/dL   Calcium 8.4  8.4 - 08.6 mg/dL   Total Protein 8.5 (*) 6.0 - 8.3 g/dL   Albumin 2.7 (*) 3.5 - 5.2 g/dL   AST 20  0 - 37 U/L   ALT 13  0 - 53 U/L   Alkaline Phosphatase 176 (*) 39 - 117 U/L   Total Bilirubin 0.4  0.3 - 1.2 mg/dL   GFR calc non Af Amer 6 (*) >90 mL/min   GFR calc Af Amer 7 (*) >90 mL/min   Anion gap 19 (*) 5 - 15  RETICULOCYTES      Result Value Ref Range   Retic Ct Pct 0.6  0.4 - 3.1 %   RBC. 2.58 (*) 4.22 - 5.81 MIL/uL   Retic Count, Manual 15.5 (*) 19.0 - 186.0 K/uL       MDM  Iv ns. Dilaudid iv. zofran iv.  Pt states nv earlier, requests ivf even though hx esrd/hd. Pt had normal hd yesterday, no sob, lungs clear. Ns 250 cc iv.   [address] [place].  Labs.  Reviewed nursing notes and prior charts for additional history.   Labs reviewed - c/w patients baseline.  Recheck, pt appears more comfortable. States pain improved but persists, and also requests phenergan for nausea.  Dilaudid iv. Phenergan iv.  Recheck hr 92, rr 16, pulse ox 98%, no increased wob. Labs c/w baseline.  Pt comfortable, no distress or apparent pain.  Pt appears stable for d/c.        Suzi Roots, MD 09/07/14 1254

## 2014-09-07 NOTE — Discharge Instructions (Signed)
It was our pleasure to take care of you today.  We hope you feel better.  Rest. Drink adequate fluids. Take your pain medication as need. You may use phenergan suppository as need for nausea - may make drowsy, no driving when taking. Follow up with your doctor/sickle cell clinic in the coming week. Return to ER if worse, new symptoms, fevers, trouble breathing, persistent vomiting, other concern.  You were given pain medication in the ER - no driving for the next 4 hours       Sickle Cell Anemia, Adult Sickle cell anemia is a condition in which red blood cells have an abnormal "sickle" shape. This abnormal shape shortens the cells' life span, which results in a lower than normal concentration of red blood cells in the blood. The sickle shape also causes the cells to clump together and block free blood flow through the blood vessels. As a result, the tissues and organs of the body do not receive enough oxygen. Sickle cell anemia causes organ damage and pain and increases the risk of infection. CAUSES  Sickle cell anemia is a genetic disorder. Those who receive two copies of the gene have the condition, and those who receive one copy have the trait. RISK FACTORS The sickle cell gene is most common in people whose families originated in [place]. Other areas of the globe where sickle cell trait occurs include the [place], [place] and [place], the [place], and the [place].  SIGNS AND SYMPTOMS  Pain, especially in the extremities, back, chest, or abdomen (common). The pain may start suddenly or may develop following an illness, especially if there is dehydration. Pain can also occur due to overexertion or exposure to extreme temperature changes.  Frequent severe bacterial infections, especially certain types of pneumonia and meningitis.  Pain and swelling in the hands and feet.  Decreased activity.   Loss of appetite.   Change in  behavior.  Headaches.  Seizures.  Shortness of breath or difficulty breathing.  Vision changes.  Skin ulcers. Those with the trait may not have symptoms or they may have mild symptoms.  DIAGNOSIS  Sickle cell anemia is diagnosed with blood tests that demonstrate the genetic trait. It is often diagnosed during the newborn period, due to mandatory testing nationwide. A variety of blood tests, X-rays, CT scans, MRI scans, ultrasounds, and lung function tests may also be done to monitor the condition. TREATMENT  Sickle cell anemia may be treated with:  Medicines. You may be given pain medicines, antibiotic medicines (to treat and prevent infections) or medicines to increase the production of certain types of hemoglobin.  Fluids.  Oxygen.  Blood transfusions. HOME CARE INSTRUCTIONS   Drink enough fluid to keep your urine clear or pale yellow. Increase your fluid intake in hot weather and during exercise.  Do not smoke. Smoking lowers oxygen levels in the blood.   Only take over-the-counter or prescription medicines for pain, fever, or discomfort as directed by your health care provider.  Take antibiotics as directed by your health care provider. Make sure you finish them it even if you start to feel better.   Take supplements as directed by your health care provider.   Consider wearing a medical alert bracelet. This tells anyone caring for you in an emergency of your condition.   When traveling, keep your medical information, health care provider's names, and the medicines you take with you at all times.   If you develop a fever, do not take medicines to  reduce the fever right away. This could cover up a problem that is developing. Notify your health care provider.  Keep all follow-up appointments with your health care provider. Sickle cell anemia requires regular medical care. SEEK MEDICAL CARE IF: You have a fever. SEEK IMMEDIATE MEDICAL CARE IF:   You feel dizzy or  faint.   You have new abdominal pain, especially on the left side near the stomach area.   You develop a persistent, often uncomfortable and painful penile erection (priapism). If this is not treated immediately it will lead to impotence.   You have numbness your arms or legs or you have a hard time moving them.   You have a hard time with speech.   You have a fever or persistent symptoms for more than 2-3 days.   You have a fever and your symptoms suddenly get worse.   You have signs or symptoms of infection. These include:   Chills.   Abnormal tiredness (lethargy).   Irritability.   Poor eating.   Vomiting.   You develop pain that is not helped with medicine.   You develop shortness of breath.  You have pain in your chest.   You are coughing up pus-like or bloody sputum.   You develop a stiff neck.  Your feet or hands swell or have pain.  Your abdomen appears bloated.  You develop joint pain. MAKE SURE YOU:  Understand these instructions. Document Released: 03/15/2006 Document Revised: 04/21/2014 Document Reviewed: 07/17/2013 [facility] Patient Information 2015 [place], [place]. This information is not intended to replace advice given to you by your health care provider. Make sure you discuss any questions you have with your health care provider.

## 2014-09-07 NOTE — ED Notes (Signed)
Pt c/o sickle cell pain in his joints onset yesterday. Pt unable to keep his medications at home down. Pt has been nausea and vomiting for 2 days.

## 2014-09-12 ENCOUNTER — Encounter (HOSPITAL_COMMUNITY): Payer: Self-pay | Admitting: Emergency Medicine

## 2014-09-12 ENCOUNTER — Emergency Department (HOSPITAL_COMMUNITY)
Admission: EM | Admit: 2014-09-12 | Discharge: 2014-09-12 | Discharge status: Against Medical Advice | Payer: Medicare Other | Attending: Emergency Medicine | Admitting: Emergency Medicine

## 2014-09-12 ENCOUNTER — Emergency Department (HOSPITAL_COMMUNITY): Payer: Medicare Other

## 2014-09-12 DIAGNOSIS — E875 Hyperkalemia: Secondary | ICD-10-CM | POA: Diagnosis not present

## 2014-09-12 DIAGNOSIS — Z21 Asymptomatic human immunodeficiency virus [HIV] infection status: Secondary | ICD-10-CM | POA: Insufficient documentation

## 2014-09-12 DIAGNOSIS — Z8719 Personal history of other diseases of the digestive system: Secondary | ICD-10-CM | POA: Diagnosis not present

## 2014-09-12 DIAGNOSIS — Z992 Dependence on renal dialysis: Secondary | ICD-10-CM | POA: Diagnosis not present

## 2014-09-12 DIAGNOSIS — I120 Hypertensive chronic kidney disease with stage 5 chronic kidney disease or end stage renal disease: Secondary | ICD-10-CM | POA: Insufficient documentation

## 2014-09-12 DIAGNOSIS — D5700 Hb-SS disease with crisis, unspecified: Secondary | ICD-10-CM | POA: Diagnosis present

## 2014-09-12 DIAGNOSIS — Z79899 Other long term (current) drug therapy: Secondary | ICD-10-CM | POA: Diagnosis not present

## 2014-09-12 DIAGNOSIS — N186 End stage renal disease: Secondary | ICD-10-CM | POA: Insufficient documentation

## 2014-09-12 DIAGNOSIS — N184 Chronic kidney disease, stage 4 (severe): Secondary | ICD-10-CM

## 2014-09-12 DIAGNOSIS — F17200 Nicotine dependence, unspecified, uncomplicated: Secondary | ICD-10-CM | POA: Diagnosis not present

## 2014-09-12 LAB — CBC WITH DIFFERENTIAL/PLATELET
Basophils Absolute: 0 10*3/uL (ref 0.0–0.1)
Basophils Relative: 0 % (ref 0–1)
EOS ABS: 0.1 10*3/uL (ref 0.0–0.7)
Eosinophils Relative: 3 % (ref 0–5)
HCT: 24.4 % — ABNORMAL LOW (ref 39.0–52.0)
HEMOGLOBIN: 7.7 g/dL — AB (ref 13.0–17.0)
LYMPHS ABS: 0.6 10*3/uL — AB (ref 0.7–4.0)
LYMPHS PCT: 26 % (ref 12–46)
MCH: 28.5 pg (ref 26.0–34.0)
MCHC: 31.6 g/dL (ref 30.0–36.0)
MCV: 90.4 fL (ref 78.0–100.0)
Monocytes Absolute: 0.2 10*3/uL (ref 0.1–1.0)
Monocytes Relative: 10 % (ref 3–12)
NEUTROS ABS: 1.4 10*3/uL — AB (ref 1.7–7.7)
NEUTROS PCT: 61 % (ref 43–77)
Platelets: 109 10*3/uL — ABNORMAL LOW (ref 150–400)
RBC: 2.7 MIL/uL — AB (ref 4.22–5.81)
RDW: 15 % (ref 11.5–15.5)
WBC: 2.2 10*3/uL — AB (ref 4.0–10.5)

## 2014-09-12 LAB — COMPREHENSIVE METABOLIC PANEL
ALK PHOS: 172 U/L — AB (ref 39–117)
ALT: 11 U/L (ref 0–53)
AST: 18 U/L (ref 0–37)
Albumin: 2.9 g/dL — ABNORMAL LOW (ref 3.5–5.2)
Anion gap: 17 — ABNORMAL HIGH (ref 5–15)
BILIRUBIN TOTAL: 0.4 mg/dL (ref 0.3–1.2)
BUN: 69 mg/dL — ABNORMAL HIGH (ref 6–23)
CHLORIDE: 94 meq/L — AB (ref 96–112)
CO2: 25 mEq/L (ref 19–32)
Calcium: 8.5 mg/dL (ref 8.4–10.5)
Creatinine, Ser: 11.83 mg/dL — ABNORMAL HIGH (ref 0.50–1.35)
GFR calc non Af Amer: 5 mL/min — ABNORMAL LOW (ref >90)
GFR, EST AFRICAN AMERICAN: 6 mL/min — AB (ref >90)
GLUCOSE: 79 mg/dL (ref 70–99)
Potassium: 5.4 mEq/L — ABNORMAL HIGH (ref 3.7–5.3)
SODIUM: 136 meq/L — AB (ref 137–147)
TOTAL PROTEIN: 8.9 g/dL — AB (ref 6.0–8.3)

## 2014-09-12 LAB — RETICULOCYTES
RBC.: 2.7 MIL/uL — ABNORMAL LOW (ref 4.22–5.81)
RETIC COUNT ABSOLUTE: 16.2 10*3/uL — AB (ref 19.0–186.0)
Retic Ct Pct: 0.6 % (ref 0.4–3.1)

## 2014-09-12 MED ORDER — HYDROMORPHONE HCL 1 MG/ML IJ SOLN
2.0000 mg | INTRAMUSCULAR | Status: DC | PRN
  Administered 2014-09-12: 2 mg via INTRAVENOUS
  Administered 2014-09-12: 1 mg via INTRAVENOUS
  Filled 2014-09-12: qty 2

## 2014-09-12 MED ORDER — CLONIDINE HCL 0.2 MG PO TABS
0.3000 mg | ORAL_TABLET | Freq: Once | ORAL | Status: AC
  Administered 2014-09-12: 0.3 mg via ORAL
  Filled 2014-09-12 (×2): qty 1

## 2014-09-12 MED ORDER — PROMETHAZINE HCL 25 MG/ML IJ SOLN
12.5000 mg | INTRAMUSCULAR | Status: DC | PRN
  Administered 2014-09-12: 12.5 mg via INTRAVENOUS
  Filled 2014-09-12: qty 1

## 2014-09-12 MED ORDER — ONDANSETRON HCL 4 MG/2ML IJ SOLN
4.0000 mg | Freq: Once | INTRAMUSCULAR | Status: AC
  Administered 2014-09-12: 4 mg via INTRAVENOUS
  Filled 2014-09-12: qty 2

## 2014-09-12 MED ORDER — HYDROMORPHONE HCL 1 MG/ML IJ SOLN: 1.0000 mg | Freq: Once | INTRAMUSCULAR | Status: DC

## 2014-09-12 MED ORDER — SODIUM CHLORIDE 0.9 % IV BOLUS (SEPSIS)
500.0000 mL | Freq: Once | INTRAVENOUS | Status: AC
  Administered 2014-09-12: 500 mL via INTRAVENOUS

## 2014-09-12 MED ORDER — CLONIDINE HCL 0.1 MG PO TABS
0.1000 mg | ORAL_TABLET | Freq: Once | ORAL | Status: AC
  Administered 2014-09-12: 0.1 mg via ORAL
  Filled 2014-09-12: qty 1

## 2014-09-12 MED ORDER — HYDROMORPHONE HCL 1 MG/ML IJ SOLN
1.0000 mg | INTRAMUSCULAR | Status: DC | PRN
  Administered 2014-09-12: 1 mg via INTRAVENOUS
  Filled 2014-09-12 (×2): qty 1

## 2014-09-12 NOTE — ED Provider Notes (Signed)
CSN: [id number]     Arrival date & time 09/12/14  1528 History   First MD Initiated Contact with Patient 09/12/14 1620     Chief Complaint  Patient presents with  . Sickle Cell Pain Crisis     (Consider location/radiation/quality/duration/timing/severity/associated sxs/prior Treatment) HPI The pt has sickle cell and he reports that he is in a crisis. Vomiting with pain in joints and back since yesterday. Numerous visits to ED in past with same complaint. Patient spcifying how much and how often he should get pain medication  Past Medical History  Diagnosis Date  . Sickle cell anemia     States Dx at 33 yo, s/p multiple prior transfusions, typically with pain in legs, back  . HIV (human immunodeficiency virus infection) 2003    Followed by Harless Nakayama at [facility], [place]   . HTN (hypertension)   . Pulmonary mass     Unclear history. States PCP was following a pulmonary mass with serial imaging  . Pancreatitis 2012    Denies any alcohol use   . Hypertension   . Blood transfusion     "several"  . End stage renal disease on dialysis HD in 2009     Followed by Dr. Buelah Manis in [place]. States due to HTN/DM.   Marland Kitchen ESRD (end stage renal disease) on dialysis     "[facility]; [place], [place]; TTS" (08/26/2014)  . Renal failure    Past Surgical History  Procedure Laterality Date  . Dialysis fistula creation Left     "arm"  . Hip fracture surgery Right     "screw placed into joint"  . Av fistula placement Left     "arm"   Family History  Problem Relation Age of Onset  . Hypertension Father   . Diabetes      Uncles and grandfather  . Cervical cancer Mother     Dec 46yo of cervical ca   History  Substance Use Topics  . Smoking status: Current Every Day Smoker -- 0.12 packs/day for 18 years    Types: Cigarettes  . Smokeless tobacco: Never Used  . Alcohol Use: No    Review of Systems  Cardiovascular: Negative for chest pain.  Gastrointestinal: Negative for abdominal  distention.  All other systems reviewed and are negative.     Allergies  Review of patient's allergies indicates no known allergies.  Home Medications   Prior to Admission medications   Medication Sig Start Date End Date Taking? Authorizing Provider  b complex-vitamin c-folic acid (NEPHRO-VITE) 0.8 MG TABS tablet Take 1 tablet by mouth at bedtime.   Yes Historical Provider, MD  calcium acetate (PHOSLO) 667 MG capsule Take 2,001 mg by mouth 3 (three) times daily with meals.   Yes Historical Provider, MD  diphenhydrAMINE (BENADRYL) 50 MG capsule Take 50 mg by mouth every 6 (six) hours as needed for itching or allergies.   Yes Historical Provider, MD  folic acid (FOLVITE) 1 MG tablet Take 1 mg by mouth daily.   Yes Historical Provider, MD  HYDROmorphone (DILAUDID) 8 MG tablet Take 8 mg by mouth every 4 (four) hours as needed for severe pain.   Yes Historical Provider, MD  hydroxyurea (HYDREA) 500 MG capsule Take 500 mg by mouth 2 (two) times daily. May take with food to minimize GI side effects.   Yes Historical Provider, MD  lisinopril (PRINIVIL,ZESTRIL) 10 MG tablet Take 10 mg by mouth daily.   Yes Historical Provider, MD  promethazine (PHENERGAN) 25 MG suppository  Place 1 suppository (25 mg total) rectally every 6 (six) hours as needed for nausea or vomiting. 09/07/14  Yes Suzi Roots, MD   BP 176/86  Pulse 80  Temp(Src) 98.6 F (37 C) (Oral)  Resp 18  SpO2 92% Physical Exam  Nursing note and vitals reviewed. Constitutional: He is oriented to person, place, and time. He appears well-developed and well-nourished. No distress.  HENT:  Head: Normocephalic and atraumatic.  Eyes: Pupils are equal, round, and reactive to light.  Neck: Normal range of motion.  Cardiovascular: Normal rate and intact distal pulses.   Pulmonary/Chest: No respiratory distress.  Abdominal: Normal appearance. He exhibits no distension. There is no tenderness. There is no rebound.  Musculoskeletal: Normal  range of motion.  Neurological: He is alert and oriented to person, place, and time. No cranial nerve deficit. Coordination normal.  Skin: Skin is warm and dry. No rash noted.  Psychiatric: He has a normal mood and affect. His behavior is normal. Judgment and thought content normal.    ED Course  Procedures (including critical care time) Medications  sodium chloride 0.9 % bolus 500 mL (0 mLs Intravenous Stopped 09/12/14 1743)  ondansetron (ZOFRAN) injection 4 mg (4 mg Intravenous Given 09/12/14 1706)  cloNIDine (CATAPRES) tablet 0.3 mg (0.3 mg Oral Given 09/12/14 1750)  cloNIDine (CATAPRES) tablet 0.1 mg (0.1 mg Oral Given 09/12/14 1856)    Labs Review Labs Reviewed  CBC WITH DIFFERENTIAL - Abnormal; Notable for the following:    WBC 2.2 (*)    RBC 2.70 (*)    Hemoglobin 7.7 (*)    HCT 24.4 (*)    Platelets 109 (*)    Neutro Abs 1.4 (*)    Lymphs Abs 0.6 (*)    All other components within normal limits  COMPREHENSIVE METABOLIC PANEL - Abnormal; Notable for the following:    Sodium 136 (*)    Potassium 5.4 (*)    Chloride 94 (*)    BUN 69 (*)    Creatinine, Ser 11.83 (*)    Total Protein 8.9 (*)    Albumin 2.9 (*)    Alkaline Phosphatase 172 (*)    GFR calc non Af Amer 5 (*)    GFR calc Af Amer 6 (*)    Anion gap 17 (*)    All other components within normal limits  RETICULOCYTES - Abnormal; Notable for the following:    RBC. 2.70 (*)    Retic Count, Manual 16.2 (*)    All other components within normal limits    Imaging Review Dg Chest Portable 1 View  09/12/2014   CLINICAL DATA:  Sickle cell crisis; history of HIV, hypertension, and dialysis dependent renal failure; history of tobacco use  EXAM: PORTABLE CHEST - 1 VIEW  COMPARISON:  PA and lateral chest of July 31, 2014  FINDINGS: The lungs are adequately inflated. There is no focal infiltrate. There is stable increased density peripherally in the left mid lung. The cardiopericardial silhouette. A is enlarged. The  pulmonary vascularity is prominent centrally. The mediastinum is normal in width. A vascular graft is present overlying the right pulmonary apex.  IMPRESSION: There is no evidence of pneumonia. Low-grade compensated CHF is suspected without pulmonary edema.   Electronically Signed   By: David  [place]   On: 09/12/2014 17:32     EKG Interpretation None     Patient left against medical advise.  Discussed risks, including death.  Invited to return if he changed his mind.  Had capacity  to make decisions. MDM   Final diagnoses:  Sickle cell anemia with pain  Chronic renal failure, stage 4 (severe)  Hyperkalemia        Nelia Shi, MD 09/13/14 2141

## 2014-09-12 NOTE — ED Notes (Signed)
The pt does not make urine

## 2014-09-12 NOTE — ED Notes (Addendum)
Pt states that he wishes to leave AMA because he did not receive his medication the way her prefers. Explained physician orders to pt. Pt states understanding and still wants to leave. States that the pain medication took the edge off. Explained the risks of leaving AMA. Dr Radford Pax made aware.

## 2014-09-12 NOTE — ED Notes (Signed)
Dialysis pt that was dialyzed yesteerday.  Fistula lt forearm

## 2014-09-12 NOTE — ED Notes (Signed)
The pt has sickle cell and he reports that he is in a crisis. Vomiting with pain in joints and back since yesterday.  He has a porta-cath

## 2014-09-16 ENCOUNTER — Emergency Department: Payer: Self-pay | Admitting: Emergency Medicine

## 2014-09-16 LAB — COMPREHENSIVE METABOLIC PANEL
ALBUMIN: 3.2 g/dL — AB (ref 3.4–5.0)
AST: 29 U/L (ref 15–37)
Alkaline Phosphatase: 211 U/L — ABNORMAL HIGH
Anion Gap: 8 (ref 7–16)
BUN: 21 mg/dL — AB (ref 7–18)
Bilirubin,Total: 0.5 mg/dL (ref 0.2–1.0)
CHLORIDE: 95 mmol/L — AB (ref 98–107)
CREATININE: 6.21 mg/dL — AB (ref 0.60–1.30)
Calcium, Total: 9.4 mg/dL (ref 8.5–10.1)
Co2: 31 mmol/L (ref 21–32)
EGFR (African American): 13 — ABNORMAL LOW
GFR CALC NON AF AMER: 11 — AB
Glucose: 87 mg/dL (ref 65–99)
Osmolality: 271 (ref 275–301)
POTASSIUM: 3.7 mmol/L (ref 3.5–5.1)
SGPT (ALT): 18 U/L
Sodium: 134 mmol/L — ABNORMAL LOW (ref 136–145)
TOTAL PROTEIN: 10.6 g/dL — AB (ref 6.4–8.2)

## 2014-09-16 LAB — CBC WITH DIFFERENTIAL/PLATELET
Basophil #: 0 10*3/uL (ref 0.0–0.1)
Basophil %: 0.7 %
EOS PCT: 2.4 %
Eosinophil #: 0.1 10*3/uL (ref 0.0–0.7)
HCT: 28.4 % — ABNORMAL LOW (ref 40.0–52.0)
HGB: 9 g/dL — ABNORMAL LOW (ref 13.0–18.0)
Lymphocyte #: 0.5 10*3/uL — ABNORMAL LOW (ref 1.0–3.6)
Lymphocyte %: 20.5 %
MCH: 29.3 pg (ref 26.0–34.0)
MCHC: 31.7 g/dL — AB (ref 32.0–36.0)
MCV: 92 fL (ref 80–100)
MONO ABS: 0.3 x10 3/mm (ref 0.2–1.0)
Monocyte %: 13.7 %
Neutrophil #: 1.6 10*3/uL (ref 1.4–6.5)
Neutrophil %: 62.7 %
Platelet: 120 10*3/uL — ABNORMAL LOW (ref 150–440)
RBC: 3.07 10*6/uL — ABNORMAL LOW (ref 4.40–5.90)
RDW: 16 % — AB (ref 11.5–14.5)
WBC: 2.5 10*3/uL — ABNORMAL LOW (ref 3.8–10.6)

## 2014-09-16 LAB — LIPASE, BLOOD: Lipase: 346 U/L (ref 73–393)

## 2014-09-16 LAB — RETICULOCYTES
ABSOLUTE RETIC COUNT: 0.0228 10*6/uL (ref 0.019–0.186)
Reticulocyte: 0.74 % (ref 0.4–3.1)

## 2014-09-17 ENCOUNTER — Emergency Department (HOSPITAL_COMMUNITY)
Admission: EM | Admit: 2014-09-17 | Discharge: 2014-09-17 | Discharge status: Against Medical Advice | Payer: Medicare Other | Attending: Emergency Medicine | Admitting: Emergency Medicine

## 2014-09-17 ENCOUNTER — Encounter (HOSPITAL_COMMUNITY): Payer: Self-pay | Admitting: Emergency Medicine

## 2014-09-17 DIAGNOSIS — F17200 Nicotine dependence, unspecified, uncomplicated: Secondary | ICD-10-CM | POA: Diagnosis not present

## 2014-09-17 DIAGNOSIS — K858 Other acute pancreatitis without necrosis or infection: Secondary | ICD-10-CM

## 2014-09-17 DIAGNOSIS — D5700 Hb-SS disease with crisis, unspecified: Secondary | ICD-10-CM | POA: Insufficient documentation

## 2014-09-17 DIAGNOSIS — Z79899 Other long term (current) drug therapy: Secondary | ICD-10-CM | POA: Insufficient documentation

## 2014-09-17 DIAGNOSIS — Z992 Dependence on renal dialysis: Secondary | ICD-10-CM | POA: Insufficient documentation

## 2014-09-17 DIAGNOSIS — I120 Hypertensive chronic kidney disease with stage 5 chronic kidney disease or end stage renal disease: Secondary | ICD-10-CM | POA: Insufficient documentation

## 2014-09-17 DIAGNOSIS — K859 Acute pancreatitis without necrosis or infection, unspecified: Secondary | ICD-10-CM | POA: Insufficient documentation

## 2014-09-17 DIAGNOSIS — N186 End stage renal disease: Secondary | ICD-10-CM | POA: Insufficient documentation

## 2014-09-17 DIAGNOSIS — Z21 Asymptomatic human immunodeficiency virus [HIV] infection status: Secondary | ICD-10-CM | POA: Insufficient documentation

## 2014-09-17 DIAGNOSIS — M2550 Pain in unspecified joint: Secondary | ICD-10-CM | POA: Diagnosis not present

## 2014-09-17 DIAGNOSIS — R109 Unspecified abdominal pain: Secondary | ICD-10-CM | POA: Diagnosis present

## 2014-09-17 DIAGNOSIS — Z8709 Personal history of other diseases of the respiratory system: Secondary | ICD-10-CM | POA: Insufficient documentation

## 2014-09-17 LAB — CBC WITH DIFFERENTIAL/PLATELET
BASOS ABS: 0 10*3/uL (ref 0.0–0.1)
Basophils Relative: 0 % (ref 0–1)
EOS ABS: 0.1 10*3/uL (ref 0.0–0.7)
Eosinophils Relative: 3 % (ref 0–5)
HCT: 27.9 % — ABNORMAL LOW (ref 39.0–52.0)
Hemoglobin: 8.7 g/dL — ABNORMAL LOW (ref 13.0–17.0)
Lymphocytes Relative: 24 % (ref 12–46)
Lymphs Abs: 0.5 10*3/uL — ABNORMAL LOW (ref 0.7–4.0)
MCH: 29.2 pg (ref 26.0–34.0)
MCHC: 31.2 g/dL (ref 30.0–36.0)
MCV: 93.6 fL (ref 78.0–100.0)
Monocytes Absolute: 0.3 10*3/uL (ref 0.1–1.0)
Monocytes Relative: 12 % (ref 3–12)
NEUTROS PCT: 61 % (ref 43–77)
Neutro Abs: 1.4 10*3/uL — ABNORMAL LOW (ref 1.7–7.7)
PLATELETS: 139 10*3/uL — AB (ref 150–400)
RBC: 2.98 MIL/uL — ABNORMAL LOW (ref 4.22–5.81)
RDW: 14.9 % (ref 11.5–15.5)
WBC: 2.3 10*3/uL — ABNORMAL LOW (ref 4.0–10.5)

## 2014-09-17 LAB — COMPREHENSIVE METABOLIC PANEL
ALT: 13 U/L (ref 0–53)
AST: 23 U/L (ref 0–37)
Albumin: 3.2 g/dL — ABNORMAL LOW (ref 3.5–5.2)
Alkaline Phosphatase: 204 U/L — ABNORMAL HIGH (ref 39–117)
Anion gap: 16 — ABNORMAL HIGH (ref 5–15)
BUN: 31 mg/dL — ABNORMAL HIGH (ref 6–23)
CALCIUM: 9.2 mg/dL (ref 8.4–10.5)
CO2: 29 mEq/L (ref 19–32)
Chloride: 91 mEq/L — ABNORMAL LOW (ref 96–112)
Creatinine, Ser: 7.26 mg/dL — ABNORMAL HIGH (ref 0.50–1.35)
GFR calc non Af Amer: 9 mL/min — ABNORMAL LOW (ref >90)
GFR, EST AFRICAN AMERICAN: 10 mL/min — AB (ref >90)
GLUCOSE: 98 mg/dL (ref 70–99)
Potassium: 4.5 mEq/L (ref 3.7–5.3)
SODIUM: 136 meq/L — AB (ref 137–147)
Total Bilirubin: 0.4 mg/dL (ref 0.3–1.2)
Total Protein: 10.5 g/dL — ABNORMAL HIGH (ref 6.0–8.3)

## 2014-09-17 LAB — RETICULOCYTES
RBC.: 2.98 MIL/uL — ABNORMAL LOW (ref 4.22–5.81)
Retic Count, Absolute: 32.8 10*3/uL (ref 19.0–186.0)
Retic Ct Pct: 1.1 % (ref 0.4–3.1)

## 2014-09-17 LAB — LIPASE, BLOOD: Lipase: 449 U/L — ABNORMAL HIGH (ref 11–59)

## 2014-09-17 MED ORDER — ONDANSETRON HCL 4 MG/2ML IJ SOLN
4.0000 mg | Freq: Once | INTRAMUSCULAR | Status: AC
  Administered 2014-09-17: 4 mg via INTRAVENOUS
  Filled 2014-09-17: qty 2

## 2014-09-17 MED ORDER — SODIUM CHLORIDE 0.9 % IV BOLUS (SEPSIS)
1000.0000 mL | Freq: Once | INTRAVENOUS | Status: AC
  Administered 2014-09-17: 1000 mL via INTRAVENOUS

## 2014-09-17 MED ORDER — HYDROMORPHONE HCL 1 MG/ML IJ SOLN
2.0000 mg | Freq: Once | INTRAMUSCULAR | Status: AC
  Administered 2014-09-17: 2 mg via INTRAVENOUS
  Filled 2014-09-17: qty 2

## 2014-09-17 MED ORDER — KETOROLAC TROMETHAMINE 30 MG/ML IJ SOLN
30.0000 mg | Freq: Once | INTRAMUSCULAR | Status: AC
  Administered 2014-09-17: 30 mg via INTRAVENOUS
  Filled 2014-09-17: qty 1

## 2014-09-17 MED ORDER — PROMETHAZINE HCL 25 MG/ML IJ SOLN
25.0000 mg | Freq: Once | INTRAMUSCULAR | Status: DC
  Filled 2014-09-17: qty 1

## 2014-09-17 MED ORDER — DIPHENHYDRAMINE HCL 50 MG/ML IJ SOLN
25.0000 mg | Freq: Once | INTRAMUSCULAR | Status: AC
  Administered 2014-09-17: 25 mg via INTRAVENOUS
  Filled 2014-09-17: qty 1

## 2014-09-17 MED ORDER — IOHEXOL 300 MG/ML  SOLN: 25.0000 mL | INTRAMUSCULAR | Status: DC

## 2014-09-17 NOTE — ED Provider Notes (Signed)
CSN: [id number]     Arrival date & time 09/17/14  0944 History   First MD Initiated Contact with Patient 09/17/14 1029     No chief complaint on file.    (Consider location/radiation/quality/duration/timing/severity/associated sxs/prior Treatment) HPI Comments: Patient is a 33 year old male with a past medical history of ESRD, sickle cell disease, HIV, hypertension, and pancreatitis who presents with abdominal pain. Symptoms started gradually and progressively worsened since the onset. The pain is located in his epigastrium and does not radiate. The pain is aching and severe. Patient also has pain in all of his joints. Patient tried PO dilaudid 8mg  for pain without relief. No aggravating/alleviating factors. Patient reports associated nausea and vomiting.    Past Medical History  Diagnosis Date  . Sickle cell anemia     States Dx at 33 yo, s/p multiple prior transfusions, typically with pain in legs, back  . HIV (human immunodeficiency virus infection) 2003    Followed by Harless Nakayama at [facility], [place]   . HTN (hypertension)   . Pulmonary mass     Unclear history. States PCP was following a pulmonary mass with serial imaging  . Pancreatitis 2012    Denies any alcohol use   . Hypertension   . Blood transfusion     "several"  . End stage renal disease on dialysis HD in 2009     Followed by Dr. Buelah Manis in [place]. States due to HTN/DM.   Marland Kitchen ESRD (end stage renal disease) on dialysis     "[facility]; [place], [place]; TTS" (08/26/2014)  . Renal failure    Past Surgical History  Procedure Laterality Date  . Dialysis fistula creation Left     "arm"  . Hip fracture surgery Right     "screw placed into joint"  . Av fistula placement Left     "arm"   Family History  Problem Relation Age of Onset  . Hypertension Father   . Diabetes      Uncles and grandfather  . Cervical cancer Mother     Dec 46yo of cervical ca   History  Substance Use Topics  . Smoking status: Current  Every Day Smoker -- 0.12 packs/day for 18 years    Types: Cigarettes  . Smokeless tobacco: Never Used  . Alcohol Use: No    Review of Systems  Constitutional: Negative for fever, chills and fatigue.  HENT: Negative for trouble swallowing.   Eyes: Negative for visual disturbance.  Respiratory: Negative for shortness of breath.   Cardiovascular: Negative for chest pain and palpitations.  Gastrointestinal: Positive for nausea, vomiting and abdominal pain. Negative for diarrhea.  Genitourinary: Negative for dysuria and difficulty urinating.  Musculoskeletal: Positive for arthralgias. Negative for neck pain.  Skin: Negative for color change.  Neurological: Negative for dizziness and weakness.  Psychiatric/Behavioral: Negative for dysphoric mood.      Allergies  Review of patient's allergies indicates no known allergies.  Home Medications   Prior to Admission medications   Medication Sig Start Date End Date Taking? Authorizing Provider  b complex-vitamin c-folic acid (NEPHRO-VITE) 0.8 MG TABS tablet Take 1 tablet by mouth at bedtime.   Yes Historical Provider, MD  calcium acetate (PHOSLO) 667 MG capsule Take 2,001 mg by mouth 3 (three) times daily with meals.   Yes Historical Provider, MD  diphenhydrAMINE (BENADRYL) 50 MG capsule Take 50 mg by mouth every 6 (six) hours as needed for itching or allergies.   Yes Historical Provider, MD  folic acid (  FOLVITE) 1 MG tablet Take 1 mg by mouth daily.   Yes Historical Provider, MD  HYDROmorphone (DILAUDID) 8 MG tablet Take 8 mg by mouth every 4 (four) hours as needed for severe pain.   Yes Historical Provider, MD  hydroxyurea (HYDREA) 500 MG capsule Take 500 mg by mouth 2 (two) times daily. May take with food to minimize GI side effects.   Yes Historical Provider, MD  lisinopril (PRINIVIL,ZESTRIL) 10 MG tablet Take 10 mg by mouth daily.   Yes Historical Provider, MD  promethazine (PHENERGAN) 25 MG suppository Place 1 suppository (25 mg total)  rectally every 6 (six) hours as needed for nausea or vomiting. 09/07/14  Yes Suzi RootsKevin E Steinl, MD   BP 165/89  Pulse 93  Temp(Src) 98.3 F (36.8 C) (Oral)  Resp 20  Ht 5\' 7"  (1.702 m)  Wt 180 lb (81.647 kg)  BMI 28.19 kg/m2  SpO2 100% Physical Exam  Nursing note and vitals reviewed. Constitutional: He is oriented to person, place, and time. He appears well-developed and well-nourished. No distress.  HENT:  Head: Normocephalic and atraumatic.  Eyes: Conjunctivae and EOM are normal. Pupils are equal, round, and reactive to light. Scleral icterus is present.  Mild scleral icterus.   Neck: Normal range of motion.  Cardiovascular: Normal rate and regular rhythm.  Exam reveals no gallop and no friction rub.   No murmur heard. Pulmonary/Chest: Effort normal and breath sounds normal. He has no wheezes. He has no rales. He exhibits no tenderness.  Abdominal: Soft. He exhibits no distension. There is tenderness. There is no rebound and no guarding.  Epigastric tenderness to palpation without other focal tenderness. No peritoneal signs.   Musculoskeletal: Normal range of motion.  Neurological: He is alert and oriented to person, place, and time. Coordination normal.  Speech is goal-oriented. Moves limbs without ataxia.   Skin: Skin is warm and dry.  Psychiatric: He has a normal mood and affect. His behavior is normal.    ED Course  Procedures (including critical care time) Labs Review Labs Reviewed  CBC WITH DIFFERENTIAL - Abnormal; Notable for the following:    WBC 2.3 (*)    RBC 2.98 (*)    Hemoglobin 8.7 (*)    HCT 27.9 (*)    Platelets 139 (*)    Neutro Abs 1.4 (*)    Lymphs Abs 0.5 (*)    All other components within normal limits  COMPREHENSIVE METABOLIC PANEL - Abnormal; Notable for the following:    Sodium 136 (*)    Chloride 91 (*)    BUN 31 (*)    Creatinine, Ser 7.26 (*)    Total Protein 10.5 (*)    Albumin 3.2 (*)    Alkaline Phosphatase 204 (*)    GFR calc non Af  Amer 9 (*)    GFR calc Af Amer 10 (*)    Anion gap 16 (*)    All other components within normal limits  LIPASE, BLOOD - Abnormal; Notable for the following:    Lipase 449 (*)    All other components within normal limits  RETICULOCYTES - Abnormal; Notable for the following:    RBC. 2.98 (*)    All other components within normal limits    Imaging Review No results found.   EKG Interpretation None      MDM   Final diagnoses:  Other acute pancreatitis  Sickle cell crisis    11:03 AM Labs pending. Patient will have fluids, toradol, zofran. Patient also given  dilaudid. Vitals stable and patient afebrile.   1:13 PM Patient has elevated lipase at 449. No other acute labs changes. Patient recommended to have CT scan and be admitted to the hospital. Patient does not want to stay here due to family affairs he needs to take care of as well as a doctor's appointment. Patient aware that his decision is not advisable and he should return to the ED if he changes his mind.   Emilia Beck, [place] 09/17/14 1530

## 2014-09-17 NOTE — ED Notes (Signed)
Pt presents with back pain and joint pain that pt reports is sickle cell crisis that began last night.  Pt also reports epigastric soreness and vomiting that began yesterday.

## 2014-09-17 NOTE — ED Provider Notes (Signed)
Medical screening examination/treatment/procedure(s) were performed by non-physician practitioner and as supervising physician I was immediately available for consultation/collaboration.   EKG Interpretation None        David H Yao, MD 09/17/14 1958 

## 2014-09-17 NOTE — ED Notes (Addendum)
GrenadaBrittany, RN assisted pt in signing AMA.  Notified PA Owens [employer] MinorSzekalski.  Pt stable and ambulated with ease to the lobby.

## 2014-11-15 ENCOUNTER — Encounter (HOSPITAL_COMMUNITY): Payer: Self-pay | Admitting: *Deleted

## 2014-11-15 ENCOUNTER — Inpatient Hospital Stay (HOSPITAL_COMMUNITY)
Admission: EM | Admit: 2014-11-15 | Discharge: 2014-11-18 | DRG: 811 | Disposition: A | Discharge status: Other Acute Inpatient Hospital | Payer: Medicare Other | Attending: Internal Medicine | Admitting: Internal Medicine

## 2014-11-15 DIAGNOSIS — B20 Human immunodeficiency virus [HIV] disease: Secondary | ICD-10-CM | POA: Diagnosis not present

## 2014-11-15 DIAGNOSIS — D5700 Hb-SS disease with crisis, unspecified: Principal | ICD-10-CM | POA: Diagnosis present

## 2014-11-15 DIAGNOSIS — I120 Hypertensive chronic kidney disease with stage 5 chronic kidney disease or end stage renal disease: Secondary | ICD-10-CM | POA: Diagnosis present

## 2014-11-15 DIAGNOSIS — Z9114 Patient's other noncompliance with medication regimen: Secondary | ICD-10-CM | POA: Diagnosis present

## 2014-11-15 DIAGNOSIS — Z79899 Other long term (current) drug therapy: Secondary | ICD-10-CM | POA: Diagnosis not present

## 2014-11-15 DIAGNOSIS — I10 Essential (primary) hypertension: Secondary | ICD-10-CM | POA: Diagnosis present

## 2014-11-15 DIAGNOSIS — E119 Type 2 diabetes mellitus without complications: Secondary | ICD-10-CM

## 2014-11-15 DIAGNOSIS — F17210 Nicotine dependence, cigarettes, uncomplicated: Secondary | ICD-10-CM | POA: Diagnosis present

## 2014-11-15 DIAGNOSIS — Z992 Dependence on renal dialysis: Secondary | ICD-10-CM

## 2014-11-15 DIAGNOSIS — N186 End stage renal disease: Secondary | ICD-10-CM | POA: Diagnosis not present

## 2014-11-15 DIAGNOSIS — D72819 Decreased white blood cell count, unspecified: Secondary | ICD-10-CM | POA: Diagnosis present

## 2014-11-15 DIAGNOSIS — R112 Nausea with vomiting, unspecified: Secondary | ICD-10-CM | POA: Diagnosis present

## 2014-11-15 DIAGNOSIS — N2581 Secondary hyperparathyroidism of renal origin: Secondary | ICD-10-CM | POA: Diagnosis present

## 2014-11-15 DIAGNOSIS — Z21 Asymptomatic human immunodeficiency virus [HIV] infection status: Secondary | ICD-10-CM

## 2014-11-15 LAB — RETICULOCYTES
RBC.: 2.67 MIL/uL — AB (ref 4.22–5.81)
RETIC COUNT ABSOLUTE: 29.4 10*3/uL (ref 19.0–186.0)
Retic Ct Pct: 1.1 % (ref 0.4–3.1)

## 2014-11-15 LAB — COMPREHENSIVE METABOLIC PANEL
ALT: 6 U/L (ref 0–53)
AST: 20 U/L (ref 0–37)
Albumin: 2.5 g/dL — ABNORMAL LOW (ref 3.5–5.2)
Alkaline Phosphatase: 130 U/L — ABNORMAL HIGH (ref 39–117)
Anion gap: 15 (ref 5–15)
BUN: 35 mg/dL — ABNORMAL HIGH (ref 6–23)
CALCIUM: 8.3 mg/dL — AB (ref 8.4–10.5)
CO2: 25 meq/L (ref 19–32)
Chloride: 96 mEq/L (ref 96–112)
Creatinine, Ser: 9.17 mg/dL — ABNORMAL HIGH (ref 0.50–1.35)
GFR calc Af Amer: 8 mL/min — ABNORMAL LOW (ref >90)
GFR calc non Af Amer: 7 mL/min — ABNORMAL LOW (ref >90)
Glucose, Bld: 96 mg/dL (ref 70–99)
Potassium: 3.9 mEq/L (ref 3.7–5.3)
SODIUM: 136 meq/L — AB (ref 137–147)
TOTAL PROTEIN: 9 g/dL — AB (ref 6.0–8.3)
Total Bilirubin: 0.4 mg/dL (ref 0.3–1.2)

## 2014-11-15 LAB — CBC WITH DIFFERENTIAL/PLATELET
Basophils Absolute: 0 10*3/uL (ref 0.0–0.1)
Basophils Relative: 1 % (ref 0–1)
EOS ABS: 0.1 10*3/uL (ref 0.0–0.7)
EOS PCT: 2 % (ref 0–5)
HCT: 24.3 % — ABNORMAL LOW (ref 39.0–52.0)
Hemoglobin: 7.5 g/dL — ABNORMAL LOW (ref 13.0–17.0)
Lymphocytes Relative: 25 % (ref 12–46)
Lymphs Abs: 0.7 10*3/uL (ref 0.7–4.0)
MCH: 28.1 pg (ref 26.0–34.0)
MCHC: 30.9 g/dL (ref 30.0–36.0)
MCV: 91 fL (ref 78.0–100.0)
MONO ABS: 0.3 10*3/uL (ref 0.1–1.0)
Monocytes Relative: 10 % (ref 3–12)
Neutro Abs: 1.6 10*3/uL — ABNORMAL LOW (ref 1.7–7.7)
Neutrophils Relative %: 62 % (ref 43–77)
Platelets: 161 10*3/uL (ref 150–400)
RBC: 2.67 MIL/uL — ABNORMAL LOW (ref 4.22–5.81)
RDW: 17.6 % — ABNORMAL HIGH (ref 11.5–15.5)
WBC: 2.7 10*3/uL — ABNORMAL LOW (ref 4.0–10.5)

## 2014-11-15 LAB — LIPASE, BLOOD: LIPASE: 27 U/L (ref 11–59)

## 2014-11-15 MED ORDER — ONDANSETRON HCL 4 MG/2ML IJ SOLN
4.0000 mg | Freq: Three times a day (TID) | INTRAMUSCULAR | Status: AC | PRN
  Administered 2014-11-15: 4 mg via INTRAVENOUS
  Filled 2014-11-15: qty 2

## 2014-11-15 MED ORDER — HYDROMORPHONE HCL 1 MG/ML IJ SOLN
1.0000 mg | Freq: Once | INTRAMUSCULAR | Status: AC
  Administered 2014-11-15: 1 mg via INTRAVENOUS
  Filled 2014-11-15: qty 1

## 2014-11-15 MED ORDER — HYDROMORPHONE HCL 1 MG/ML IJ SOLN
2.0000 mg | Freq: Once | INTRAMUSCULAR | Status: AC
  Administered 2014-11-15: 2 mg via INTRAVENOUS
  Filled 2014-11-15: qty 2

## 2014-11-15 MED ORDER — RENA-VITE PO TABS
1.0000 | ORAL_TABLET | Freq: Every day | ORAL | Status: DC
  Administered 2014-11-16 – 2014-11-17 (×2): 1 via ORAL
  Filled 2014-11-15 (×3): qty 1

## 2014-11-15 MED ORDER — FOLIC ACID 5 MG/ML IJ SOLN
1.0000 mg | Freq: Once | INTRAMUSCULAR | Status: DC
  Filled 2014-11-15: qty 0.2

## 2014-11-15 MED ORDER — LISINOPRIL 10 MG PO TABS
10.0000 mg | ORAL_TABLET | Freq: Every day | ORAL | Status: DC
  Administered 2014-11-17: 10 mg via ORAL
  Filled 2014-11-15 (×3): qty 1

## 2014-11-15 MED ORDER — ONDANSETRON HCL 4 MG/2ML IJ SOLN
4.0000 mg | Freq: Once | INTRAMUSCULAR | Status: AC
Start: 2014-11-15 — End: 2014-11-15
  Administered 2014-11-15: 4 mg via INTRAVENOUS
  Filled 2014-11-15: qty 2

## 2014-11-15 MED ORDER — CETYLPYRIDINIUM CHLORIDE 0.05 % MT LIQD
7.0000 mL | Freq: Two times a day (BID) | OROMUCOSAL | Status: DC
Start: 2014-11-15 — End: 2014-11-18
  Administered 2014-11-15: 7 mL via OROMUCOSAL

## 2014-11-15 MED ORDER — DIPHENHYDRAMINE HCL 50 MG/ML IJ SOLN
12.5000 mg | Freq: Four times a day (QID) | INTRAMUSCULAR | Status: DC | PRN
  Administered 2014-11-15 – 2014-11-18 (×8): 12.5 mg via INTRAVENOUS
  Filled 2014-11-15 (×8): qty 1

## 2014-11-15 MED ORDER — HEPARIN SODIUM (PORCINE) 5000 UNIT/ML IJ SOLN
5000.0000 [IU] | Freq: Three times a day (TID) | INTRAMUSCULAR | Status: DC
  Administered 2014-11-15 – 2014-11-18 (×7): 5000 [IU] via SUBCUTANEOUS
  Filled 2014-11-15 (×8): qty 1

## 2014-11-15 MED ORDER — METOCLOPRAMIDE HCL 5 MG/ML IJ SOLN: 10.0000 mg | Freq: Once | INTRAMUSCULAR | Status: DC

## 2014-11-15 MED ORDER — PROMETHAZINE HCL 25 MG/ML IJ SOLN
12.5000 mg | Freq: Once | INTRAMUSCULAR | Status: AC
  Administered 2014-11-15: 12.5 mg via INTRAVENOUS
  Filled 2014-11-15: qty 1

## 2014-11-15 MED ORDER — DIPHENHYDRAMINE HCL 50 MG/ML IJ SOLN
12.5000 mg | Freq: Once | INTRAMUSCULAR | Status: AC
  Administered 2014-11-15: 12.5 mg via INTRAVENOUS
  Filled 2014-11-15: qty 1

## 2014-11-15 MED ORDER — SODIUM CHLORIDE 0.9 % IV BOLUS (SEPSIS)
250.0000 mL | Freq: Once | INTRAVENOUS | Status: AC
  Administered 2014-11-15: 250 mL via INTRAVENOUS

## 2014-11-15 MED ORDER — DOCUSATE SODIUM 100 MG PO CAPS
100.0000 mg | ORAL_CAPSULE | Freq: Two times a day (BID) | ORAL | Status: DC
  Filled 2014-11-15 (×7): qty 1

## 2014-11-15 MED ORDER — CALCIUM ACETATE 667 MG PO CAPS
2001.0000 mg | ORAL_CAPSULE | Freq: Three times a day (TID) | ORAL | Status: DC
  Filled 2014-11-15 (×10): qty 3

## 2014-11-15 MED ORDER — HYDROMORPHONE HCL 1 MG/ML IJ SOLN: 1.0000 mg | INTRAMUSCULAR | Status: DC | PRN

## 2014-11-15 MED ORDER — HYDROMORPHONE HCL 1 MG/ML IJ SOLN
2.0000 mg | INTRAMUSCULAR | Status: AC | PRN
  Administered 2014-11-15 – 2014-11-16 (×7): 2 mg via INTRAVENOUS
  Filled 2014-11-15 (×5): qty 2
  Filled 2014-11-15: qty 1
  Filled 2014-11-15: qty 2
  Filled 2014-11-15: qty 1
  Filled 2014-11-15: qty 2

## 2014-11-15 MED ORDER — HYDROXYUREA 500 MG PO CAPS
500.0000 mg | ORAL_CAPSULE | Freq: Two times a day (BID) | ORAL | Status: DC
  Filled 2014-11-15 (×2): qty 1

## 2014-11-15 MED ORDER — HYDRALAZINE HCL 20 MG/ML IJ SOLN: 10.0000 mg | Freq: Three times a day (TID) | INTRAMUSCULAR | Status: DC | PRN

## 2014-11-15 MED ORDER — PROMETHAZINE HCL 25 MG PO TABS
12.5000 mg | ORAL_TABLET | Freq: Four times a day (QID) | ORAL | Status: DC | PRN
  Administered 2014-11-15 – 2014-11-18 (×6): 12.5 mg via ORAL
  Filled 2014-11-15 (×8): qty 1

## 2014-11-15 MED ORDER — FOLIC ACID 1 MG PO TABS
1.0000 mg | ORAL_TABLET | Freq: Every day | ORAL | Status: DC
  Administered 2014-11-17: 1 mg via ORAL
  Filled 2014-11-15 (×3): qty 1

## 2014-11-15 NOTE — ED Notes (Signed)
Pt in c/o sickle cell pain crisis, c/o generalized body aches, pain to all joints, also reports vomiting, symptoms started yesterday

## 2014-11-15 NOTE — ED Notes (Signed)
Pt does not create urine. 

## 2014-11-15 NOTE — ED Provider Notes (Signed)
CSN: [id number]     Arrival date & time 11/15/14  1221 History   First MD Initiated Contact with Patient 11/15/14 1339     Chief Complaint  Patient presents with  . Sickle Cell Pain Crisis   HPI  Patient is a 33 year old male with past medical history of sickle cell anemia, HIV, hypertension and end-stage renal disease on dialysis Tuesday Thursday Saturday, and pancreatitis who presents to the emergency room with myalgias diffusely and nausea and vomiting. Patient states that proximal only 2 days ago he started to have pain in all his joints and extremities. He states his pain is very consistent with his sickle cell pain. He has been unable to keep anything down at home due to nausea and vomiting. Patient has tried ODT Zofran at home with no relief. Patient denies any chest pain, fevers, shortness of breath, leg swelling, PND, orthopnea.    Past Medical History  Diagnosis Date  . Sickle cell anemia     States Dx at 33 yo, s/p multiple prior transfusions, typically with pain in legs, back  . HIV (human immunodeficiency virus infection) 2003    Followed by Harless Nakayama at [facility], [place]   . HTN (hypertension)   . Pulmonary mass     Unclear history. States PCP was following a pulmonary mass with serial imaging  . Pancreatitis 2012    Denies any alcohol use   . Hypertension   . Blood transfusion     "several"  . End stage renal disease on dialysis HD in 2009     Followed by Dr. Buelah Manis in [place]. States due to HTN/DM.   Marland Kitchen ESRD (end stage renal disease) on dialysis     "[facility]; [place], [place]; TTS" (08/26/2014)  . Renal failure    Past Surgical History  Procedure Laterality Date  . Dialysis fistula creation Left     "arm"  . Hip fracture surgery Right     "screw placed into joint"  . Av fistula placement Left     "arm"   Family History  Problem Relation Age of Onset  . Hypertension Father   . Diabetes      Uncles and grandfather  . Cervical cancer Mother     Dec  46yo of cervical ca   History  Substance Use Topics  . Smoking status: Current Every Day Smoker -- 0.12 packs/day for 18 years    Types: Cigarettes  . Smokeless tobacco: Never Used  . Alcohol Use: No    Review of Systems  Constitutional: Positive for fatigue. Negative for fever and chills.  Respiratory: Negative for chest tightness, shortness of breath and wheezing.   Cardiovascular: Negative for chest pain, palpitations and leg swelling.  Gastrointestinal: Positive for nausea, vomiting and abdominal pain. Negative for diarrhea, constipation, blood in stool and anal bleeding.  Musculoskeletal: Positive for myalgias and arthralgias. Negative for back pain, joint swelling, gait problem, neck pain and neck stiffness.  All other systems reviewed and are negative.     Allergies  Review of patient's allergies indicates no known allergies.  Home Medications   Prior to Admission medications   Medication Sig Start Date End Date Taking? Authorizing Provider  b complex-vitamin c-folic acid (NEPHRO-VITE) 0.8 MG TABS tablet Take 1 tablet by mouth daily.    Yes Historical Provider, MD  calcium acetate (PHOSLO) 667 MG capsule Take 2,001 mg by mouth 3 (three) times daily with meals.   Yes Historical Provider, MD  diphenhydrAMINE (BENADRYL) 50 MG capsule  Take 50 mg by mouth every 6 (six) hours as needed for itching or allergies.   Yes Historical Provider, MD  folic acid (FOLVITE) 1 MG tablet Take 1 mg by mouth daily.   Yes Historical Provider, MD  HYDROmorphone (DILAUDID) 8 MG tablet Take 8 mg by mouth every 4 (four) hours as needed for severe pain.   Yes Historical Provider, MD  hydroxyurea (HYDREA) 500 MG capsule Take 500 mg by mouth 2 (two) times daily. May take with food to minimize GI side effects.   Yes Historical Provider, MD  lisinopril (PRINIVIL,ZESTRIL) 10 MG tablet Take 10 mg by mouth daily.   Yes Historical Provider, MD  promethazine (PHENERGAN) 25 MG suppository Place 1 suppository (25  mg total) rectally every 6 (six) hours as needed for nausea or vomiting. Patient not taking: Reported on 11/15/2014 09/07/14   Suzi RootsKevin E Steinl, MD   BP 168/73 mmHg  Pulse 87  Temp(Src) 98.2 F (36.8 C) (Oral)  Resp 24  Ht 5\' 7"  (1.702 m)  Wt 180 lb (81.647 kg)  BMI 28.19 kg/m2  SpO2 98% Physical Exam  Constitutional: He is oriented to person, place, and time. He appears well-developed and well-nourished. No distress.  HENT:  Head: Normocephalic and atraumatic.  Mouth/Throat: Oropharynx is clear and moist. No oropharyngeal exudate.  Eyes: Conjunctivae and EOM are normal. Pupils are equal, round, and reactive to light. No scleral icterus.  Neck: Normal range of motion. Neck supple. No JVD present. No thyromegaly present.  Cardiovascular: Normal rate, regular rhythm, normal heart sounds and intact distal pulses.  Exam reveals no gallop and no friction rub.   No murmur heard. Pulmonary/Chest: Effort normal and breath sounds normal. No respiratory distress. He has no wheezes. He has no rales. He exhibits no tenderness.  Gynecomastia   Abdominal: Soft. Normal appearance and bowel sounds are normal. He exhibits no distension and no mass. There is tenderness in the epigastric area. There is no rebound, no guarding and no CVA tenderness.  Musculoskeletal: Normal range of motion.  Lymphadenopathy:    He has no cervical adenopathy.  Neurological: He is alert and oriented to person, place, and time. He has normal strength. No cranial nerve deficit or sensory deficit. Coordination normal.  Skin: Skin is warm and dry. He is not diaphoretic.  Psychiatric: He has a normal mood and affect. His behavior is normal. Judgment and thought content normal.  Nursing note and vitals reviewed.   ED Course  Procedures (including critical care time) Labs Review Labs Reviewed  CBC WITH DIFFERENTIAL - Abnormal; Notable for the following:    WBC 2.7 (*)    RBC 2.67 (*)    Hemoglobin 7.5 (*)    HCT 24.3 (*)     RDW 17.6 (*)    Neutro Abs 1.6 (*)    All other components within normal limits  COMPREHENSIVE METABOLIC PANEL - Abnormal; Notable for the following:    Sodium 136 (*)    BUN 35 (*)    Creatinine, Ser 9.17 (*)    Calcium 8.3 (*)    Total Protein 9.0 (*)    Albumin 2.5 (*)    Alkaline Phosphatase 130 (*)    GFR calc non Af Amer 7 (*)    GFR calc Af Amer 8 (*)    All other components within normal limits  RETICULOCYTES - Abnormal; Notable for the following:    RBC. 2.67 (*)    All other components within normal limits  LIPASE, BLOOD  Imaging Review No results found.   EKG Interpretation None      MDM   Final diagnoses:  Sickle cell crisis   Patient is a 33 year old male who presents to the emergency room for all over body pain, nausea and vomiting. Physical exam reveals alert and oriented male with stable vital signs. Physical exam reveals mildly tender epigastrium with no peritoneal signs. No focal neurological deficits. Patient is afebrile. Patient does not complain of chest pain or shortness of breath at this time. CBC is at baseline. CMP reveals improving LFTs, and end-stage renal disease. Reticulocytes are at baseline. Lipase is negative. Patient has received 5 mg of Dilaudid here in the ED with no relief. He still complains of nausea with Reglan, Zofran, and Phenergan IV. Given high needs for pain medication and nausea medicines will admit the patient at this time will admit the patient at this time. I have spoken with Dr. Yetta BarreJones who will admit to MedSurg under Dr. Cyndie ChimeGranfortuna.      Eben Burowourtney A Forcucci, PA-C 11/15/14 1633  Nelia Shiobert L Beaton, MD 11/16/14 [phone]

## 2014-11-15 NOTE — Progress Notes (Signed)
rn called for report  

## 2014-11-15 NOTE — ED Notes (Signed)
[facility] pt is use to Dilaudid 4mg  Iv and Phenergan IV

## 2014-11-15 NOTE — Plan of Care (Signed)
Problem: Phase I Progression Outcomes Goal: Pt. aware of pain management plan Outcome: Completed/Met Date Met:  11/15/14

## 2014-11-15 NOTE — Progress Notes (Signed)
Called pt placement to get bed assignment changed to 6E.

## 2014-11-15 NOTE — H&P (Signed)
Date: 11/15/2014               Patient Name:  [name] MRN: [id number]  DOB: 01-01-1981 Age / Sex: 33 y.o., male   PCP: Pcp Not In System         Medical Service: Internal Medicine Teaching Service         Attending Physician: Dr. Levert Feinstein, MD    First Contact: Dr. Senaida Ores Pager: [phone]  Second Contact: Dr. Yetta Barre Pager: [phone]       After Hours (After 5p/  First Contact Pager: [phone]  weekends / holidays): Second Contact Pager: [phone]   Chief Complaint: generalized pain, nausea, vomiting  History of Present Illness: [name] is a 33 yo male with PMHx of Sickle Cell, ESRD on HD TTS, HIV, HTN, and pancreatitis who presents to the ED with a 2 day history of worsening nausea and vomiting and one day history of worsening pain in his joints and back. He has been unable to keep medications or food down for 3 days. Patient has a history of Sickle Cell with frequent crises, 4 in the last year. Patient states it is typical for his pain crises to originally present with nausea and vomiting and to progress to pain. He admits to recent sick contacts but denies any fever, chills, nasal congestion, sinus pressure, cough, chest pain or shortness of breath. Patient has never had priapism. He takes hydroxyurea, folate and dilaudid at home for his sickle cell, but has not been able to take them or any of his other medications in 3 days.   Meds: Current Facility-Administered Medications  Medication Dose Route Frequency Provider Last Rate Last Dose  . [START ON 11/16/2014] calcium acetate (PHOSLO) capsule 2,001 mg  2,001 mg Oral TID WC [name] Paris, MD      . diphenhydrAMINE (BENADRYL) injection 12.5 mg  12.5 mg Intravenous Q6H PRN [name] Paris, MD      . docusate sodium (COLACE) capsule 100 mg  100 mg Oral BID [name] Paris, MD      . Melene Muller ON 11/16/2014] folic acid (FOLVITE) tablet 1 mg  1 mg Oral Daily [name] Paris, MD      . folic acid injection 1 mg  1 mg  Intravenous Once [name] Paris, MD      . heparin injection 5,000 Units  5,000 Units Subcutaneous 3 times per day [name] Paris, MD      . hydrALAZINE (APRESOLINE) injection 10 mg  10 mg Intravenous Q8H PRN Jill Alexanders, MD      . HYDROmorphone (DILAUDID) injection 1 mg  1 mg Intravenous Q4H PRN [name] A Forcucci, PA-C      . HYDROmorphone (DILAUDID) injection 2 mg  2 mg Intravenous Q2H PRN [name] Paris, MD      . Melene Muller ON 11/16/2014] hydroxyurea (HYDREA) capsule 500 mg  500 mg Oral BID [name] Paris, MD      . Melene Muller ON 11/16/2014] lisinopril (PRINIVIL,ZESTRIL) tablet 10 mg  10 mg Oral Daily [name] Paris, MD      . Melene Muller ON 11/16/2014] multivitamin (RENA-VIT) tablet 1 tablet  1 tablet Oral QHS [name] Paris, MD      . ondansetron [facility]) injection 4 mg  4 mg Intravenous Q8H PRN [name] A Forcucci, PA-C      . promethazine (PHENERGAN) tablet 12.5 mg  12.5 mg Oral Q6H PRN [name] Paris, MD  Allergies: Allergies as of 11/15/2014  . (No Known Allergies)   Past Medical History  Diagnosis Date  . Sickle cell anemia     States Dx at 33 yo, s/p multiple prior transfusions, typically with pain in legs, back  . HIV (human immunodeficiency virus infection) 2003    Follow[facility] by Harless NakayamaSusan Blevins [facility] [place]   . HTN (hypertension)   . Pulmonary mass     Unclear history. States PCP was following a pulmonary mass with serial imaging  . Pancreatitis 2012    Denies any alcohol use   . Hypertension   . Blood transfusion     "several"  . End stage renal disease on dialysis HD in 2009     Followed by Dr. Buelah [place]isooperberg in [place]. States due to HTN/DM.   Marland Kitchen. ESRD (end stage renal disease) on dialysis     [place] [place]; [place], [place]; TTS" (08/26/2014)  . Renal failure    Past Surgical History  Procedure Laterality Date  . Dialysis fistula creation Left     "arm"  . Hip fracture surgery Right     "screw placed into joint"  . Av fistula placement Left     "arm"   Family History    Problem Relation Age of Onset  . Hypertension Father   . Diabetes      Uncles and grandfather  . Cervical cancer Mother     Dec 46yo of cervical ca   History   Social History  . Marital Status: Single    Spouse Name: N/A    Number of Children: N/A  . Years of Education: N/A   Occupational History  . Not on file.   Social History Main Topics  . Smoking status: Current Every Day Smoker -- 0.12 packs/day for 18 years    Types: Cigarettes  . Smokeless tobacco: Never Used  . Alcohol Use: No  . Drug Use: No  . Sexual Activity: No   Other Topics Concern  . Not on file   Social History Narrative   ** Merged History Encounter **       Living with a friend, [place] from [place]. Both parents are deceased, but has sisters and friends as Education officer, communitysocial network. Works as a Paediatric nursebarber.     Review of Systems: General: Denies fever, chills, fatigue, change in appetite and diaphoresis.  Respiratory: Denies SOB, cough, DOE, chest tightness, and wheezing.   Cardiovascular: Denies chest pain and palpitations.  Gastrointestinal: Admits to nausea and vomiting, but denies abdominal pain, diarrhea, constipation, blood in stool and abdominal distention.  Genitourinary: Denies dysuria, urgency, frequency, hematuria, suprapubic pain and flank pain. Endocrine: Denies hot or cold intolerance, polyuria, and polydipsia. Musculoskeletal: Admits to myalgias, back pain, and arthralgias, worst in his legs and back.  Skin: Denies pallor, rash and wounds.  Neurological: Denies dizziness, headaches, weakness, lightheadedness, numbness,seizures, and syncope, Psychiatric/Behavioral: Denies mood changes, confusion, nervousness, sleep disturbance and agitation.  Physical Exam: Filed Vitals:   11/15/14 1600 11/15/14 1630 11/15/14 1730 11/15/14 1759  BP: 180/83 172/83 146/75 176/87  Pulse: 98 89 90 100  Temp:    98.3 F (36.8 C)  TempSrc:    Oral  Resp:    20  Height:      Weight:      SpO2: 97%  93% 99% 95%   General: Vital signs reviewed.  Patient is well-developed and well-nourished, in no acute distress and cooperative with exam.  Cardiovascular: Tachycardic, regular rhythm, S1 normal, S2  normal, no murmurs, gallops, or rubs. Pulmonary/Chest: Clear to auscultation bilaterally, no wheezes, rales, or rhonchi. Abdominal: Soft, non-tender, non-distended, BS +, no masses, organomegaly, or guarding present.  Musculoskeletal: No joint deformities, erythema, or stiffness, ROM full and nontender. Extremities: Tender on palpation. No lower extremity edema bilaterally,  pulses symmetric and intact bilaterally. No cyanosis or clubbing. Skin: Warm, dry and intact. No rashes or erythema. Psychiatric: Normal mood and affect. speech and behavior is normal. Cognition and memory are normal.   Lab results: Basic Metabolic Panel:  Recent Labs  16/10/96 1340  NA 136*  K 3.9  CL 96  CO2 25  GLUCOSE 96  BUN 35*  CREATININE 9.17*  CALCIUM 8.3*   Liver Function Tests:  Recent Labs  11/15/14 1340  AST 20  ALT 6  ALKPHOS 130*  BILITOT 0.4  PROT 9.0*  ALBUMIN 2.5*    Recent Labs  11/15/14 1340  LIPASE 27   CBC:  Recent Labs  11/15/14 1340  WBC 2.7*  NEUTROABS 1.6*  HGB 7.5*  HCT 24.3*  MCV 91.0  PLT 161   Anemia Panel:  Recent Labs  11/15/14 1340  RETICCTPCT 1.1   Assessment & Plan by Problem: Active Problems:   Sickle cell anemia with pain   Sickle cell crisis  Sickle Cell Crisis: Patient presented with nausea, vomiting, and diffuse and severe myalgias and arthralgias. Patient takes dilaudid 8 mg Q4H prn pain, folic acid 1 mg daily, and hydroxyurea 500 mg BID at home. Patient has not taken these medications in 3 days due to nausea and vomiting. On admission, vital signs showed the patient was afebrile, hypertensive at 188/85, tachycardic at 101, RR 20 and satting 100% on room air. Patient was given a total of 5 mg IV of dilaudid over the course of 4 hours which  did not control his pain. Phenergan has minimally improved nausea. Patient may not be in a complete sickle cell crisis as his hemoglobin is at baseline, 7.5 and reticulocyte count is 1.1.  -Dilaudid 2 mg IV Q2H prn -Benadryl 12.5 mg Q6H prn -Folic Acid 1 mg QD -Hydroxyurea 500 mg BID -Phenergan 12.5 mg Q6H prn  -Zofran 4 mg Q8H prn -CBC/BMET tomorrow am -Clear Diet Liquid -EKG tomorrow am -Mag/Phos tomorrow am -Colace 100 mg BID  ESRD: Patient gets HD TTS, but missed his dialysis session today due to pain and could not complete his full HD session on Thursday secondary to nausea and vomiting. Potassium 3.9 and patient does not appear to be volume overloaded. This was discussed with Nephrology who agrees that the patient does not need emergent dialysis. Nephrology will see our patient and he will like get dialyzed Sunday or Monday. -BMET tomorrow am -Daily weights -Rena-vit -Phoslo -Nephrology following, appreciate recommendations  HIV: Patient was first diagnosed with HIV in 2003. He currently does not see an ID physician but was previously on anti-retrovirals and taken off. Last CD4 count was 100 on 08/28/14 and HIV RNA 28,818 on 08/28/2014. Patient denies cough or diarrhea. -Monitor -Follow up with PCP  HTN: Hypertensive on admission at 188/85. Patient is on lisinopril 10 mg daily at home, but missed last 3 days due to nausea and vomiting. -Lisinopril 10 mg daily -Hydralazine 10 mg IV Q8H prn SBP >190 and/or DBP >110  DVT/PE ppx: Heparin TID  Dispo: Disposition is deferred at this time, awaiting improvement of current medical problems. Anticipated discharge in approximately 1-2 day(s).   The patient does have a current PCP (Pcp Not  In System) and [facility] after [facility].  The patient does not have transportation limitations that hinder transportation to clinic appointments.  Signed: Jill AlexandersAlexa Richardson, DO PGY-1 Internal Medicine  Resident Pager # [phone] 11/15/2014 6:03 PM

## 2014-11-16 DIAGNOSIS — R112 Nausea with vomiting, unspecified: Secondary | ICD-10-CM | POA: Diagnosis present

## 2014-11-16 DIAGNOSIS — Z992 Dependence on renal dialysis: Secondary | ICD-10-CM

## 2014-11-16 DIAGNOSIS — D5700 Hb-SS disease with crisis, unspecified: Principal | ICD-10-CM

## 2014-11-16 DIAGNOSIS — N186 End stage renal disease: Secondary | ICD-10-CM | POA: Insufficient documentation

## 2014-11-16 LAB — COMPREHENSIVE METABOLIC PANEL
ALT: 5 U/L (ref 0–53)
AST: 17 U/L (ref 0–37)
Albumin: 2.2 g/dL — ABNORMAL LOW (ref 3.5–5.2)
Alkaline Phosphatase: 125 U/L — ABNORMAL HIGH (ref 39–117)
Anion gap: 16 — ABNORMAL HIGH (ref 5–15)
BUN: 37 mg/dL — ABNORMAL HIGH (ref 6–23)
CALCIUM: 7.9 mg/dL — AB (ref 8.4–10.5)
CO2: 25 meq/L (ref 19–32)
Chloride: 97 mEq/L (ref 96–112)
Creatinine, Ser: 10.2 mg/dL — ABNORMAL HIGH (ref 0.50–1.35)
GFR, EST AFRICAN AMERICAN: 7 mL/min — AB (ref >90)
GFR, EST NON AFRICAN AMERICAN: 6 mL/min — AB (ref >90)
GLUCOSE: 90 mg/dL (ref 70–99)
Potassium: 3.8 mEq/L (ref 3.7–5.3)
SODIUM: 138 meq/L (ref 137–147)
TOTAL PROTEIN: 8.2 g/dL (ref 6.0–8.3)
Total Bilirubin: 0.3 mg/dL (ref 0.3–1.2)

## 2014-11-16 LAB — CBC
HCT: 23.6 % — ABNORMAL LOW (ref 39.0–52.0)
Hemoglobin: 7.2 g/dL — ABNORMAL LOW (ref 13.0–17.0)
MCH: 27.9 pg (ref 26.0–34.0)
MCHC: 30.5 g/dL (ref 30.0–36.0)
MCV: 91.5 fL (ref 78.0–100.0)
PLATELETS: 167 10*3/uL (ref 150–400)
RBC: 2.58 MIL/uL — AB (ref 4.22–5.81)
RDW: 17.6 % — ABNORMAL HIGH (ref 11.5–15.5)
WBC: 2.7 10*3/uL — ABNORMAL LOW (ref 4.0–10.5)

## 2014-11-16 LAB — MAGNESIUM: Magnesium: 2.3 mg/dL (ref 1.5–2.5)

## 2014-11-16 LAB — PHOSPHORUS: Phosphorus: 4.7 mg/dL — ABNORMAL HIGH (ref 2.3–4.6)

## 2014-11-16 MED ORDER — HYDROMORPHONE HCL 1 MG/ML IJ SOLN
1.0000 mg | INTRAMUSCULAR | Status: DC | PRN
  Administered 2014-11-16 – 2014-11-17 (×9): 2 mg via INTRAVENOUS
  Filled 2014-11-16 (×5): qty 2
  Filled 2014-11-16: qty 1
  Filled 2014-11-16 (×3): qty 2
  Filled 2014-11-16: qty 1

## 2014-11-16 MED ORDER — HYDROMORPHONE HCL 1 MG/ML IJ SOLN
2.0000 mg | INTRAMUSCULAR | Status: DC | PRN
  Administered 2014-11-16 (×2): 2 mg via INTRAVENOUS
  Filled 2014-11-16 (×2): qty 2

## 2014-11-16 NOTE — Consult Note (Signed)
[facility] Renal Consultation Note    Indication for Consultation:  Management of ESRD/hemodialysis; anemia, hypertension/volume and secondary hyperparathyroidism PCP:  HPI: [name] is a 33 y.o. male with ESRD (TTS [facility]) due to HTN, HIV + Type 1 DM, chronic pancreatitis was first seen by [place] in March 2013 and last was seen during September 2015 admission.   He presented yesterday with with N, V, joint pain and myalgias.and tells me his last dialysis was Thursday. He tells me he lives in [place] and has a family member drive him to [facility] for dialysis. He denies SOB, CP or cough .He does make urine; no problems with constipation or diarrhea.  He tells me it is hard to sit up for lung exam due to back pain and hurts when I press on legs or take off his socks. He denies any problems with access or problems during dialysis treatments   Past Medical History  Diagnosis Date  . Sickle cell anemia     States Dx at 33 yo, s/p multiple prior transfusions, typically with pain in legs, back  . HIV (human immunodeficiency virus infection) 2003    Followed by Harless Nakayama at [facility] [place]   . HTN (hypertension)   . Pulmonary mass     Unclear history. States PCP was following a pulmonary mass with serial imaging  . Pancreatitis 2012    Denies any alcohol use   . Hypertension   . Blood transfusion     "several"  . End stage renal disease on dialysis HD in 2009     Followed by Dr. Buelah Manis in [place]. States due to HTN/DM.   Marland Kitchen ESRD (end stage renal disease) on dialysis     "[facility]; [place], [place]; TTS" (08/26/2014)  . Renal failure    Past Surgical History  Procedure Laterality Date  . Dialysis fistula creation Left     "arm"  . Hip fracture surgery Right     "screw placed into joint"  . Av fistula placement Left     "arm"   Family History  Problem Relation Age of Onset  . Hypertension Father   . Diabetes      Uncles and grandfather   . Cervical cancer Mother     Dec 46yo of cervical ca   Social History:  reports that he has been smoking Cigarettes.  He has a 2.16 pack-year smoking history. He has never used smokeless tobacco. He reports that he does not drink alcohol or use illicit drugs. No Known Allergies Prior to Admission medications   Medication Sig Start Date End Date Taking? Authorizing Provider  b complex-vitamin c-folic acid (NEPHRO-VITE) 0.8 MG TABS tablet Take 1 tablet by mouth daily.    Yes Historical Provider, MD  calcium acetate (PHOSLO) 667 MG capsule Take 2,001 mg by mouth 3 (three) times daily with meals.   Yes Historical Provider, MD  diphenhydrAMINE (BENADRYL) 50 MG capsule Take 50 mg by mouth every 6 (six) hours as needed for itching or allergies.   Yes Historical Provider, MD  folic acid (FOLVITE) 1 MG tablet Take 1 mg by mouth daily.   Yes Historical Provider, MD  HYDROmorphone (DILAUDID) 8 MG tablet Take 8 mg by mouth every 4 (four) hours as needed for severe pain.   Yes Historical Provider, MD  hydroxyurea (HYDREA) 500 MG capsule Take 500 mg by mouth 2 (two) times daily. May take with food to minimize GI side effects.  Yes Historical Provider, MD  lisinopril (PRINIVIL,ZESTRIL) 10 MG tablet Take 10 mg by mouth daily.   Yes Historical Provider, MD  promethazine (PHENERGAN) 25 MG suppository Place 1 suppository (25 mg total) rectally every 6 (six) hours as needed for nausea or vomiting. Patient not taking: Reported on 11/15/2014 09/07/14   Suzi RootsKevin E Steinl, MD   Current Facility-Administered Medications  Medication Dose Route Frequency Provider Last Rate Last Dose  . antiseptic oral rinse (CPC / CETYLPYRIDINIUM CHLORIDE 0.05%) solution 7 mL  7 mL Mouth Rinse BID Levert FeinsteinJames M Granfortuna, MD   7 mL at 11/15/14 2123  . calcium acetate (PHOSLO) capsule 2,001 mg  2,001 mg Oral TID WC [name] ParisEden W Jones, MD   2,001 mg at 11/16/14 0800  . diphenhydrAMINE (BENADRYL) injection 12.5 mg  12.5 mg Intravenous Q6H PRN [name] ParisEden W  Jones, MD   12.5 mg at 11/16/14 0945  . docusate sodium (COLACE) capsule 100 mg  100 mg Oral BID [name] ParisEden W Jones, MD   100 mg at 11/15/14 2123  . folic acid (FOLVITE) tablet 1 mg  1 mg Oral Daily [name] ParisEden W Jones, MD   1 mg at 11/16/14 1000  . folic acid injection 1 mg  1 mg Intravenous Once [name] ParisEden W Jones, MD      . heparin injection 5,000 Units  5,000 Units Subcutaneous 3 times per day [name] ParisEden W Jones, MD   5,000 Units at 11/16/14 0533  . hydrALAZINE (APRESOLINE) injection 10 mg  10 mg Intravenous Q8H PRN Jill AlexandersAlexa Richardson, MD      . HYDROmorphone (DILAUDID) injection 2 mg  2 mg Intravenous Q2H PRN [name] ParisEden W Jones, MD   2 mg at 11/16/14 1019  . lisinopril (PRINIVIL,ZESTRIL) tablet 10 mg  10 mg Oral Daily [name] ParisEden W Jones, MD   10 mg at 11/16/14 1000  . multivitamin (RENA-VIT) tablet 1 tablet  1 tablet Oral QHS [name] ParisEden W Jones, MD      . promethazine [facility](PHENERGAN) tablet 12.5 mg  12.5 mg Oral Q6H PRN [name] ParisEden W Jones, MD   12.5 mg at 11/16/14 0756   Labs: Basic Metabolic Panel:  Recent Labs Lab 11/15/14 1340 11/16/14 0355  NA 136* 138  K 3.9 3.8  CL 96 97  CO2 25 25  GLUCOSE 96 90  BUN 35* 37*  CREATININE 9.17* 10.20*  CALCIUM 8.3* 7.9*  PHOS  --  4.7*   Liver Function Tests:  Recent Labs Lab 11/15/14 1340 11/16/14 0355  AST 20 17  ALT 6 5  ALKPHOS 130* 125*  BILITOT 0.4 0.3  PROT 9.0* 8.2  ALBUMIN 2.5* 2.2*    Recent Labs Lab 11/15/14 1340  LIPASE 27   CBC:  Recent Labs Lab 11/15/14 1340 11/16/14 0355  WBC 2.7* 2.7*  NEUTROABS 1.6*  --   HGB 7.5* 7.2*  HCT 24.3* 23.6*  MCV 91.0 91.5  PLT 161 167   ROS: As per HPI otherwise negative.  Physical Exam: Filed Vitals:   11/15/14 1759 11/15/14 1811 11/15/14 2053 11/16/14 0512  BP: 176/87  174/82 157/64  Pulse: 100  72 100  Temp: 98.3 F (36.8 C)  98.2 F (36.8 C) 97.8 F (36.6 C)  TempSrc: Oral  Oral Oral  Resp: 20  18 17   Height:  5\' 7"  (1.702 m)    Weight:  88.2 kg (194 lb 7.1 oz) 88.3 kg (194 lb 10.7 oz)   SpO2: 95%  100%  100%     General: chronically ill appearing AA M in no  acute distress. Head: Normocephalic, atraumatic, sclera non-icteric, mucus membranes are moist Neck: Supple Lungs: Clear crackles left base, dim BSBreathing is unlabored. Heart:  RRR  2/6 murmur Abdomen: Soft, non-tender, non-distended with normoactive bowel sounds. No rebound/guarding. No obvious abdominal masses. M-S:  Strength and tone appear normal for age. Lower extremities: tr LE edema, no open wounds, skin thickened, dry, flakey Neuro: Alert and oriented X 3. Moves all extremities spontaneously. Psych:  Responds to questions appropriately with a normal affect. Dialysis Access: left AVGG + bruit  Dialysis Orders: [facility] - per pt 3.5 hours and EDW 82.5- during his September visit here, he said his EDW was 2080 and he was on 4 hrs.  Assessment/Plan: 1. Sickle cell crisis- per primary 2. N and V, hx pancreaitis, on CL 3. ESRD -  TTS - last HD was Thursday 11/26 - K in the 3s - plan HD Monday am first round - contact HD center Monday for orders:  Pt moved here about 6 months ago and [place] [facility] [place] [place] - family member drives him.  At some point wants to change HD to here but hasn't made arrangements yet and not very interested in making the transition now.  (would need to review information/attendance records from [place]- during that admission he was noted to have had very fragmented health care with admissions to multiple hospitals and drug seeking behaviors) 4. Hypertension/volume  - lisinopril 10 vol up 6 kg by wts -sats ok 5. Anemia  - Hgb 7.2 - tells me he gets transfused if < 6- check with home unit Monday for orders 6. Metabolic bone disease -  Get info from his dialysis unit on Monday/ continue phoslo 7. Nutrition - CL at present 8. HIV/Type I DM - per primary  Christopher SliderMartha B Cresencia Asmus, PA-C [facility] Beeper [phone] 11/16/2014, 10:42 AM

## 2014-11-16 NOTE — Plan of Care (Signed)
Problem: Phase I Progression Outcomes Goal: Pain controlled with appropriate interventions Outcome: Completed/Met Date Met:  11/16/14     

## 2014-11-16 NOTE — Progress Notes (Signed)
Subjective:  Patient was seen and examined this morning. He continues to complain of nausea and vomiting. He vomited a small amount overnight but has been dry heaving this morning. He cannot tolerate liquids by mouth yet. Patient also continues to have severe pain. Currently a 6/10 since he was recently given medications, but overnight he was unable to sleep due to the pain.  Objective: Vital signs in last 24 hours: Filed Vitals:   11/15/14 1759 11/15/14 1811 11/15/14 2053 11/16/14 0512  BP: 176/87  174/82 157/64  Pulse: 100  72 100  Temp: 98.3 F (36.8 C)  98.2 F (36.8 C) 97.8 F (36.6 C)  TempSrc: Oral  Oral Oral  Resp: 20  18 17   Height:  5\' 7"  (1.702 m)    Weight:  88.2 kg (194 lb 7.1 oz) 88.3 kg (194 lb 10.7 oz)   SpO2: 95%  100% 100%   Weight change:   Intake/Output Summary (Last 24 hours) at 11/16/14 1210 Last data filed at 11/16/14 0513  Gross per 24 hour  Intake    120 ml  Output      0 ml  Net    120 ml   General: Vital signs reviewed. Patient is well-developed and well-nourished, in no acute distress and cooperative with exam.  Cardiovascular: Regular rate, regular rhythm, S1 normal, S2 normal, no murmurs, gallops, or rubs. Pulmonary/Chest: Clear to auscultation bilaterally, no wheezes, rales, or rhonchi. Abdominal: Soft, non-tender, non-distended, BS +, no masses, organomegaly, or guarding present.  Musculoskeletal: No joint deformities, erythema, or stiffness, ROM full and nontender. Extremities: AVF graft in left upper extremity, without thrill or bruit. Tender on palpation. No lower extremity edema bilaterally, pulses symmetric and intact bilaterally. No cyanosis or clubbing. Skin: Warm, dry and intact. No rashes or erythema. Psychiatric: Normal mood and affect. speech and behavior is normal. Cognition and memory are normal.   Lab Results: Basic Metabolic Panel:  Recent Labs Lab 11/15/14 1340 11/16/14 0355  NA 136* 138  K 3.9 3.8  CL 96 97  CO2  25 25  GLUCOSE 96 90  BUN 35* 37*  CREATININE 9.17* 10.20*  CALCIUM 8.3* 7.9*  MG  --  2.3  PHOS  --  4.7*   Liver Function Tests:  Recent Labs Lab 11/15/14 1340 11/16/14 0355  AST 20 17  ALT 6 5  ALKPHOS 130* 125*  BILITOT 0.4 0.3  PROT 9.0* 8.2  ALBUMIN 2.5* 2.2*    Recent Labs Lab 11/15/14 1340  LIPASE 27   CBC:  Recent Labs Lab 11/15/14 1340 11/16/14 0355  WBC 2.7* 2.7*  NEUTROABS 1.6*  --   HGB 7.5* 7.2*  HCT 24.3* 23.6*  MCV 91.0 91.5  PLT 161 167   Anemia Panel:  Recent Labs Lab 11/15/14 1340  RETICCTPCT 1.1   Medications:  I have reviewed the patient's current medications. Prior to Admission:  Prescriptions prior to admission  Medication Sig Dispense Refill Last Dose  . b complex-vitamin c-folic acid (NEPHRO-VITE) 0.8 MG TABS tablet Take 1 tablet by mouth daily.    11/13/2014  . calcium acetate (PHOSLO) 667 MG capsule Take 2,001 mg by mouth 3 (three) times daily with meals.   11/13/2014  . diphenhydrAMINE (BENADRYL) 50 MG capsule Take 50 mg by mouth every 6 (six) hours as needed for itching or allergies.   11/12/2014  . folic acid (FOLVITE) 1 MG tablet Take 1 mg by mouth daily.   11/13/2014  . HYDROmorphone (DILAUDID) 8 MG tablet Take  8 mg by mouth every 4 (four) hours as needed for severe pain.   11/12/2014  . hydroxyurea (HYDREA) 500 MG capsule Take 500 mg by mouth 2 (two) times daily. May take with food to minimize GI side effects.   11/13/2014  . lisinopril (PRINIVIL,ZESTRIL) 10 MG tablet Take 10 mg by mouth daily.   11/13/2014  . promethazine (PHENERGAN) 25 MG suppository Place 1 suppository (25 mg total) rectally every 6 (six) hours as needed for nausea or vomiting. (Patient not taking: Reported on 11/15/2014) 8 each 0 Not Taking at Unknown time   Scheduled Meds: . antiseptic oral rinse  7 mL Mouth Rinse BID  . calcium acetate  2,001 mg Oral TID WC  . docusate sodium  100 mg Oral BID  . folic acid  1 mg Oral Daily  . folic acid  1 mg  Intravenous Once  . heparin  5,000 Units Subcutaneous 3 times per day  . lisinopril  10 mg Oral Daily  . multivitamin  1 tablet Oral QHS   Continuous Infusions:  PRN Meds:.diphenhydrAMINE, hydrALAZINE, HYDROmorphone (DILAUDID) injection, promethazine Assessment/Plan: Active Problems:   Sickle cell anemia with pain   Sickle cell crisis  Sickle Cell Crisis: Patient presented with nausea, vomiting, and diffuse and severe myalgias and arthralgias. Patient continues to have nausea, vomiting and severe pain. We will hold hydroxyurea due to low WBC. Patient had a BM this morning. Patient has not been able to tolerate a clear liquid diet. We will attempt to wean down opioid medications. -Dilaudid 2 mg IV Q2H prn, wean to Q3H today -Benadryl 12.5 mg Q6H prn -Folic Acid 1 mg QD -Phenergan 12.5 mg Q6H prn  -Zofran 4 mg Q8H prn -CBC/BMET tomorrow am -Clear Diet Liquid -Colace 100 mg BID  ESRD: Patient gets HD TTS, but missed his dialysis session on Saturday. Potassium 3.8 this morning, no obvious signs of volume overload, no indications for emergent dialysis. Patient will likely be dialyzed tomorrow. -BMET tomorrow am -Daily weights -Rena-vit -Phoslo -Nephrology following, appreciate recommendations  HIV: Patient was first diagnosed with HIV in 2003. He currently does not see an ID physician but was previously on anti-retrovirals and taken off. Last CD4 count was 100 on 08/28/14 and HIV RNA 28,818 on 08/28/2014. Patient denies cough or diarrhea. -Monitor -Follow up with PCP  HTN: Hypertension improved to 157/64, patient did not require hydralazine. Patient is on lisinopril 10 mg daily at home. -Lisinopril 10 mg daily -Hydralazine 10 mg IV Q8H prn SBP >190 and/or DBP >110  DVT/PE ppx: Heparin TID  Dispo: Disposition is deferred at this time, awaiting improvement of current medical problems.  Anticipated discharge in approximately 1-2 day(s).   The patient does have a current PCP (Pcp Not  In System) and does not need [facility] after discharge.  The patient does not have transportation limitations that hinder transportation to clinic appointments.  .Services Needed at time of discharge: Y = Yes, Blank = No PT:   OT:   RN:   Equipment:   Other:     LOS: 1 day   Jill AlexandersAlexa Richardson, DO PGY-1 Internal Medicine Resident Pager # [phone] 11/16/2014 12:10 PM

## 2014-11-16 NOTE — Progress Notes (Signed)
11/16/2014 10:29 PM  Patient has had pizza and/or chicken wings ordered two nights in a row. Orders state the he is to be on a clear liquid diet. RN Ty educated the patient about his orders and stated that he was not going to eat the food and that his family ordered it for him two nights it row. He still continues to call for pain medication Q2 on the dot and nausea medication Q6 on the dot. On call services were notified about the ordering of foods the past two nights. Will continue to assess and monitor the patient throughout the night.   [address] BSN, RN-BC, [place] [facility] [place] [id number] Number [phone]

## 2014-11-17 DIAGNOSIS — N186 End stage renal disease: Secondary | ICD-10-CM

## 2014-11-17 DIAGNOSIS — B20 Human immunodeficiency virus [HIV] disease: Secondary | ICD-10-CM

## 2014-11-17 DIAGNOSIS — L989 Disorder of the skin and subcutaneous tissue, unspecified: Secondary | ICD-10-CM

## 2014-11-17 DIAGNOSIS — I120 Hypertensive chronic kidney disease with stage 5 chronic kidney disease or end stage renal disease: Secondary | ICD-10-CM

## 2014-11-17 DIAGNOSIS — Z9111 Patient's noncompliance with dietary regimen: Secondary | ICD-10-CM

## 2014-11-17 DIAGNOSIS — Z21 Asymptomatic human immunodeficiency virus [HIV] infection status: Secondary | ICD-10-CM

## 2014-11-17 LAB — BASIC METABOLIC PANEL
ANION GAP: 15 (ref 5–15)
BUN: 42 mg/dL — ABNORMAL HIGH (ref 6–23)
CALCIUM: 8 mg/dL — AB (ref 8.4–10.5)
CO2: 25 meq/L (ref 19–32)
CREATININE: 11.87 mg/dL — AB (ref 0.50–1.35)
Chloride: 99 mEq/L (ref 96–112)
GFR calc Af Amer: 6 mL/min — ABNORMAL LOW (ref >90)
GFR, EST NON AFRICAN AMERICAN: 5 mL/min — AB (ref >90)
Glucose, Bld: 81 mg/dL (ref 70–99)
Potassium: 4.5 mEq/L (ref 3.7–5.3)
Sodium: 139 mEq/L (ref 137–147)

## 2014-11-17 LAB — HEPATITIS B CORE ANTIBODY, TOTAL: Hep B Core Total Ab: NONREACTIVE

## 2014-11-17 LAB — CBC
HCT: 23.5 % — ABNORMAL LOW (ref 39.0–52.0)
Hemoglobin: 7.1 g/dL — ABNORMAL LOW (ref 13.0–17.0)
MCH: 28 pg (ref 26.0–34.0)
MCHC: 30.2 g/dL (ref 30.0–36.0)
MCV: 92.5 fL (ref 78.0–100.0)
Platelets: 160 10*3/uL (ref 150–400)
RBC: 2.54 MIL/uL — AB (ref 4.22–5.81)
RDW: 17.7 % — ABNORMAL HIGH (ref 11.5–15.5)
WBC: 2.6 10*3/uL — ABNORMAL LOW (ref 4.0–10.5)

## 2014-11-17 LAB — HEPATITIS B SURFACE ANTIGEN: Hepatitis B Surface Ag: NEGATIVE

## 2014-11-17 MED ORDER — HEPARIN SODIUM (PORCINE) 1000 UNIT/ML DIALYSIS: 1000.0000 [IU] | INTRAMUSCULAR | Status: DC | PRN

## 2014-11-17 MED ORDER — PENTAFLUOROPROP-TETRAFLUOROETH EX AERO: 1.0000 "application " | INHALATION_SPRAY | CUTANEOUS | Status: DC | PRN

## 2014-11-17 MED ORDER — SULFAMETHOXAZOLE-TRIMETHOPRIM 400-80 MG PO TABS
1.0000 | ORAL_TABLET | Freq: Every day | ORAL | Status: DC
  Administered 2014-11-17: 1 via ORAL
  Filled 2014-11-17 (×2): qty 1

## 2014-11-17 MED ORDER — HYDROMORPHONE HCL 1 MG/ML IJ SOLN
1.0000 mg | INTRAMUSCULAR | Status: DC | PRN
  Administered 2014-11-18 (×2): 1 mg via INTRAVENOUS
  Filled 2014-11-17 (×2): qty 1

## 2014-11-17 MED ORDER — SODIUM CHLORIDE 0.9 % IV SOLN: 100.0000 mL | INTRAVENOUS | Status: DC | PRN

## 2014-11-17 MED ORDER — HYDROMORPHONE HCL 1 MG/ML IJ SOLN
INTRAMUSCULAR | Status: AC
  Filled 2014-11-17: qty 2

## 2014-11-17 MED ORDER — LIDOCAINE HCL (PF) 1 % IJ SOLN: 5.0000 mL | INTRAMUSCULAR | Status: DC | PRN

## 2014-11-17 MED ORDER — NEPRO/CARBSTEADY PO LIQD
237.0000 mL | ORAL | Status: DC | PRN
  Filled 2014-11-17: qty 237

## 2014-11-17 MED ORDER — OXYCODONE-ACETAMINOPHEN 5-325 MG PO TABS: 1.0000 | ORAL_TABLET | Freq: Four times a day (QID) | ORAL | Status: DC | PRN

## 2014-11-17 MED ORDER — LIDOCAINE-PRILOCAINE 2.5-2.5 % EX CREA
1.0000 "application " | TOPICAL_CREAM | CUTANEOUS | Status: DC | PRN
  Filled 2014-11-17: qty 5

## 2014-11-17 MED ORDER — HYDROMORPHONE HCL 1 MG/ML IJ SOLN
1.0000 mg | INTRAMUSCULAR | Status: DC | PRN
  Administered 2014-11-17 (×3): 2 mg via INTRAVENOUS
  Filled 2014-11-17 (×2): qty 2

## 2014-11-17 MED ORDER — HYDROCERIN EX CREA
TOPICAL_CREAM | Freq: Two times a day (BID) | CUTANEOUS | Status: DC
  Administered 2014-11-17: 21:00:00 via TOPICAL
  Filled 2014-11-17: qty 113

## 2014-11-17 MED ORDER — DARBEPOETIN ALFA 60 MCG/0.3ML IJ SOSY
60.0000 ug | PREFILLED_SYRINGE | INTRAMUSCULAR | Status: DC
  Filled 2014-11-17: qty 0.3

## 2014-11-17 MED ORDER — ALTEPLASE 2 MG IJ SOLR
2.0000 mg | Freq: Once | INTRAMUSCULAR | Status: DC | PRN
  Filled 2014-11-17: qty 2

## 2014-11-17 NOTE — Procedures (Signed)
I was present at this dialysis session, have reviewed the session itself and made  appropriate changes  Vinson Moselleob Sten Dematteo MD (pgr) [phone]    ([phone]) [phone] 11/17/2014, 12:12 PM

## 2014-11-17 NOTE — Progress Notes (Signed)
Subjective:  Patient was seen and examined this morning. Complains of 6/10 pain in back and joints, improving. He continues to be nauseous and states he has bilious vomiting. He is unable to eat anything including clear liquid diet.   Overnight events: Patient has had pizza and/or chicken wings ordered two nights in a row. Orders state the he is to be on a clear liquid diet. RN Ty educated the patient about his orders and stated that he was not going to eat the food and that his family ordered it for him two nights it row. He still continues to call for pain medication Q2 on the dot and nausea medication Q6 on the dot.   Objective: Vital signs in last 24 hours: Filed Vitals:   11/17/14 0826 11/17/14 0830 11/17/14 0900 11/17/14 0930  BP: 180/97 134/98 142/98 152/96  Pulse: 94 98 105 99  Temp:      TempSrc:      Resp:      Height:      Weight:      SpO2:       Weight change:  No intake or output data in the 24 hours ending 11/17/14 0959 General: Vital signs reviewed. Patient is well-developed and well-nourished, in no acute distress and cooperative with exam.  Cardiovascular: Regular rate, regular rhythm, S1 normal, S2 normal, no murmurs, gallops, or rubs. Pulmonary/Chest: Clear to auscultation bilaterally, no wheezes, rales, or rhonchi. Abdominal: Soft, non-tender, non-distended, BS +, no masses, organomegaly, or guarding present.  Musculoskeletal: No joint deformities, erythema, or stiffness, ROM full and nontender. Extremities: AVF graft in left upper extremity, without thrill or bruit. Tender on palpation. No lower extremity edema bilaterally, pulses symmetric and intact bilaterally. No cyanosis or clubbing. Skin: Warm, dry and intact. No rashes or erythema. Psychiatric: Normal mood and affect. speech and behavior is normal. Cognition and memory are normal.   Lab Results: Basic Metabolic Panel:  Recent Labs Lab 11/16/14 0355 11/17/14 0740  NA 138 139  K 3.8 4.5  CL 97  99  CO2 25 25  GLUCOSE 90 81  BUN 37* 42*  CREATININE 10.20* 11.87*  CALCIUM 7.9* 8.0*  MG 2.3  --   PHOS 4.7*  --    Liver Function Tests:  Recent Labs Lab 11/15/14 1340 11/16/14 0355  AST 20 17  ALT 6 5  ALKPHOS 130* 125*  BILITOT 0.4 0.3  PROT 9.0* 8.2  ALBUMIN 2.5* 2.2*    Recent Labs Lab 11/15/14 1340  LIPASE 27   CBC:  Recent Labs Lab 11/15/14 1340 11/16/14 0355 11/17/14 0740  WBC 2.7* 2.7* 2.6*  NEUTROABS 1.6*  --   --   HGB 7.5* 7.2* 7.1*  HCT 24.3* 23.6* 23.5*  MCV 91.0 91.5 92.5  PLT 161 167 160   Anemia Panel:  Recent Labs Lab 11/15/14 1340  RETICCTPCT 1.1   Medications:  I have reviewed the patient's current medications. Prior to Admission:  Prescriptions prior to admission  Medication Sig Dispense Refill Last Dose  . b complex-vitamin c-folic acid (NEPHRO-VITE) 0.8 MG TABS tablet Take 1 tablet by mouth daily.    11/13/2014  . calcium acetate (PHOSLO) 667 MG capsule Take 2,001 mg by mouth 3 (three) times daily with meals.   11/13/2014  . diphenhydrAMINE (BENADRYL) 50 MG capsule Take 50 mg by mouth every 6 (six) hours as needed for itching or allergies.   11/12/2014  . folic acid (FOLVITE) 1 MG tablet Take 1 mg by mouth daily.  11/13/2014  . HYDROmorphone (DILAUDID) 8 MG tablet Take 8 mg by mouth every 4 (four) hours as needed for severe pain.   11/12/2014  . hydroxyurea (HYDREA) 500 MG capsule Take 500 mg by mouth 2 (two) times daily. May take with food to minimize GI side effects.   11/13/2014  . lisinopril (PRINIVIL,ZESTRIL) 10 MG tablet Take 10 mg by mouth daily.   11/13/2014  . promethazine (PHENERGAN) 25 MG suppository Place 1 suppository (25 mg total) rectally every 6 (six) hours as needed for nausea or vomiting. (Patient not taking: Reported on 11/15/2014) 8 each 0 Not Taking at Unknown time   Scheduled Meds: . antiseptic oral rinse  7 mL Mouth Rinse BID  . calcium acetate  2,001 mg Oral TID WC  . docusate sodium  100 mg Oral BID    . folic acid  1 mg Oral Daily  . folic acid  1 mg Intravenous Once  . heparin  5,000 Units Subcutaneous 3 times per day  . lisinopril  10 mg Oral Daily  . multivitamin  1 tablet Oral QHS   Continuous Infusions:  PRN Meds:.sodium chloride, sodium chloride, alteplase, diphenhydrAMINE, feeding supplement (NEPRO CARB STEADY), heparin, hydrALAZINE, HYDROmorphone (DILAUDID) injection, lidocaine (PF), lidocaine-prilocaine, oxyCODONE-acetaminophen, pentafluoroprop-tetrafluoroeth, promethazine Assessment/Plan: Active Problems:   Sickle cell anemia with pain   Sickle cell crisis   Nausea with vomiting   ESRD (end stage renal disease) on dialysis  Sickle Cell Crisis: Patient presented with nausea, vomiting, and diffuse and severe myalgias and arthralgias. Holding hydroxyurea due to low WBC. Patient was unable to tolerate a clear liquid diet and was dry heaving when we saw him yesterday. Per nursing, patient has had pizza and/or chicken wings ordered two nights in a row. RN Ty educated the patient about his orders and stated that he was not going to eat the food and that his family ordered it for him two nights it row. He still continues to call for pain medication Q2 on the dot and nausea medication Q6 on the dot. Patient continues to have pain, but improving. He follows with Dr. Raford PitcherBarrett hematologist/oncologist in [place]. -Dilaudid 2 mg IV Q4H -Perocet 5/325 1-2 tabs Q6H prn  -Benadryl 12.5 mg Q6H prn -Folic Acid 1 mg QD -Phenergan 12.5 mg Q6H prn  -Zofran 4 mg Q8H prn -CBC/BMET tomorrow am -Clear Diet Liquid -Colace 100 mg BID -Hep B surface antigen -Hep B core antibody  ESRD: Patient gets HD TTS, but missed his dialysis session on Saturday. Patient is getting dialyzed today. Patient follows with a nephrologist in [place]. -BMET tomorrow am -Daily weights -Rena-vit -Phoslo -Nephrology following, appreciate recommendations  HIV: Patient was first diagnosed with HIV in 2003. He currently  does not see an ID physician but was previously on anti-retrovirals and taken off. Last CD4 count was 100 on 08/28/14 and HIV RNA 28,818 on 08/28/2014. Patient denies cough or diarrhea. -Monitor -Consult ID  HTN: Hypertensive at 171/85.Marland Kitchen. Patient is on lisinopril 10 mg daily at home. This dose may need to be increased, but we will assess BP post HD. -Lisinopril 10 mg daily -Hydralazine 10 mg IV Q8H prn SBP >190 and/or DBP >110\ -Monitor BP post dialysis  DVT/PE ppx: Heparin TID  Dispo: Disposition is deferred at this time, awaiting improvement of current medical problems.  Anticipated discharge in approximately 1-2 day(s).   The patient does have a current PCP (Pcp Not In System) and does not [facility] follow[facility] appointment after discharge.  The patient does  not have transportation limitations that hinder transportation to clinic appointments.  .Services Needed at time of discharge: Y = Yes, Blank = No PT:   OT:   RN:   Equipment:   Other:     LOS: 2 days   Jill Alexanders, DO PGY-1 Internal Medicine Resident Pager # [phone] 11/17/2014 9:59 AM

## 2014-11-17 NOTE — Plan of Care (Signed)
Problem: Phase I Progression Outcomes Goal: Initial discharge plan identified Outcome: Completed/Met Date Met:  11/17/14 Goal: Voiding Quantity Sufficient Outcome: Not Applicable Date Met:  39/67/28 Pt is anuric due to Hx. ESRD Goal: Bowel Movement At Least Every 3 Days Outcome: Completed/Met Date Met:  11/17/14 Goal: Hemodynamically stable Outcome: Completed/Met Date Met:  11/17/14

## 2014-11-17 NOTE — Progress Notes (Signed)
Internal Medicine Attending  Date: 11/17/2014  Patient name: [name] Conrad Pasqual Medical record number: [id number] Date of birth: Aug 30, 1981 Age: 33 y.o. Gender: male  I saw and evaluated the patient, and discussed his care with resident on A.M rounds.  I reviewed the resident's note by Dr. Senaida Oresichardson and I agree with the resident's findings and plans as documented in her note.  Dr. Rogelia BogaButcher will take over as attending physician tomorrow 11/18/2014.

## 2014-11-17 NOTE — Progress Notes (Signed)
Subjective:  On hd and denies any improvement/ [place]  Past few months  And [place] now/ I  Called his home unit [phone] [facility] [place] = per Charge RN "independently DC from that unit, Last TX there Sep 18, 2014. And no communication from Pt. "  Objective Vital signs in last 24 hours: Filed Vitals:   11/17/14 0826 11/17/14 0830 11/17/14 0900 11/17/14 0930  BP: 180/97 134/98 142/98 152/96  Pulse: 94 98 105 99  Temp:      TempSrc:      Resp:      Height:      Weight:      SpO2:       Weight change:   Physical Exam: General: alert NAD on HD Heart: RRR, no rub, gallop or mur Lungs: CTA bilat Abdomen:Obese BS pos. ,soft, NT, ND Extremities:Bipedal trace edema with dry flaky skin  Dialysis Access: L Ua avgg patent on HD   Dialysis Orders: [facility] - per pt 3.5 hours and EDW 82.5- during his September visit here, he said his EDW was 6180 and he was on 4 hrs.  Problem/Plan: 1. Sickle cell crisis- per Admit team  2. N and V with  hx pancreatitis  on CL/ per notes from staff RN "Pizza and Chicken wings  At Night ,family bringing in" 3. ESRD - TTS - [place] HD [place] per Pt. - K in the 3s / Cr 9.17 and bun 35 with co2- 25 on admit- plan HD today and tues 3.5 Normal time to keep on schedule am : [facility] - family member drives him. He tells me this am " No problem getting back [place] HD in [place] unit "'just make some calls there and sign their papers I'll  Get back on schedule there."" At some point wants to change HD to here but hasn't made arrangements yet and not very interested in making the transition now. (would need to review information/attendance records from [place]- during that admission he was noted to have had very fragmented health care with admissions to multiple hospitals and drug seeking behaviors) 4. Hypertension/volume -  BP 152/96 this am   meds=lisinopril 10mg  q d / vol up 6 kg by wts  5. Anemia - Hgb 7.2>7.1 - tells me gets transfused when HGB  below 6 / start Aranesp 60 mcg  q weekly Tues HD 6. Metabolic bone disease -  continue phoslo  Ca 9.6 corrected  Phos 4.7/ thinks he was getting IV vit d at op  Hd will check Ipth and then dose vit d 7. Nutrition -  Alb 2.2 / per admit team CL at present/ on renal vit. 8. HIV - per primary  Lenny Pastelavid Zeyfang, PA-C [facility] Beeper [phone] 11/17/2014,10:27 AM  LOS: 2 days   Pt seen, examined, agree w assess/plan as above with additions as indicated. I spoke with staff at [place] in [place], they said that they will take him upon d/c from the hospital and requested that we fax them information about this hospital stay at the time of discharge - we will take care of this. Vinson Moselleob Jamesetta Greenhalgh MD pager [phone]    cell [phone] 11/17/2014, 11:55 AM     Labs: Basic Metabolic Panel:  Recent Labs Lab 11/15/14 1340 11/16/14 0355 11/17/14 0740  NA 136* 138 139  K 3.9 3.8 4.5  CL 96 97 99  CO2 25 25 25   GLUCOSE 96 90 81  BUN 35* 37* 42*  CREATININE 9.17* 10.20* 11.87*  CALCIUM 8.3* 7.9* 8.0*  PHOS  --  4.7*  --    Liver Function Tests:  Recent Labs Lab 11/15/14 1340 11/16/14 0355  AST 20 17  ALT 6 5  ALKPHOS 130* 125*  BILITOT 0.4 0.3  PROT 9.0* 8.2  ALBUMIN 2.5* 2.2*    Recent Labs Lab 11/15/14 1340  LIPASE 27   No results for input(s): AMMONIA in the last 168 hours. CBC:  Recent Labs Lab 11/15/14 1340 11/16/14 0355 11/17/14 0740  WBC 2.7* 2.7* 2.6*  NEUTROABS 1.6*  --   --   HGB 7.5* 7.2* 7.1*  HCT 24.3* 23.6* 23.5*  MCV 91.0 91.5 92.5  PLT 161 167 160   Studies/Results: No results found. Medications:   . antiseptic oral rinse  7 mL Mouth Rinse BID  . calcium acetate  2,001 mg Oral TID WC  . docusate sodium  100 mg Oral BID  . folic acid  1 mg Oral Daily  . folic acid  1 mg Intravenous Once  . heparin  5,000 Units  Subcutaneous 3 times per day  . lisinopril  10 mg Oral Daily  . multivitamin  1 tablet Oral QHS

## 2014-11-17 NOTE — Discharge Summary (Signed)
This patient left AMA.  Name: [name] MRN: [id number] DOB: 1981/10/14 33 y.o. PCP: Pcp Not In System  Date of Admission: 11/15/2014  1:19 PM Date of Discharge: 11/17/2014 Attending Physician: Dr. Rogelia BogaButcher  Discharge Diagnosis:  Principal Problem:   Sickle cell anemia with pain Active Problems:   DM (diabetes mellitus)   HTN (hypertension)   Sickle cell crisis   ESRD (end stage renal disease) on dialysis   AIDS (acquired immune deficiency syndrome)  Discharge Medications:   Medication List    ASK your doctor about these medications        b complex-vitamin c-folic acid 0.8 MG Tabs tablet  Take 1 tablet by mouth daily.     calcium acetate 667 MG capsule  Commonly known as:  PHOSLO  Take 2,001 mg by mouth 3 (three) times daily with meals.     diphenhydrAMINE 50 MG capsule  Commonly known as:  BENADRYL  Take 50 mg by mouth every 6 (six) hours as needed for itching or allergies.     folic acid 1 MG tablet  Commonly known as:  FOLVITE  Take 1 mg by mouth daily.     HYDROmorphone 8 MG tablet  Commonly known as:  DILAUDID  Take 8 mg by mouth every 4 (four) hours as needed for severe pain.     hydroxyurea 500 MG capsule  Commonly known as:  HYDREA  Take 500 mg by mouth 2 (two) times daily. May take with food to minimize GI side effects.     lisinopril 10 MG tablet  Commonly known as:  PRINIVIL,ZESTRIL  Take 10 mg by mouth daily.     promethazine 25 MG suppository  Commonly known as:  PHENERGAN  Place 1 suppository (25 mg total) rectally every 6 (six) hours as needed for nausea or vomiting.        Disposition and follow-up:   [name] [facility] Good [facility].  At the hospital follow up visit please address:  1.  Nausea & Vomiting: Of unclear cause. Seen to have pizza and chicken wings at bedside during admission despite clear liquids order. May have been due to pain medications or  gastroenteritis. Please follow.  Sickle Cell Disease?: Reticulocyte count normal and Hgb >7. However, resisted any sort of taper. He states that he has SCD, but no mention in chart. Patient was treated with IV dilaudid at his request; often did not complain of any pain. We were awaiting electrophoresis before he left AMA. No records available as we [place]ologist [place] is [facility] calling. Patient was extremely difficult with nursing staff on this admission.  HIV/AIDS: Patient's last CD4 100 08/2014; has ID follow up in 3 weeks to start HAART (likely pi based regimen). Was started on bactrim for PJP ppx on this admission, but patient left AMA without a prescription.  HTN: Was normotensive once home lisinopril 10 mg was initiated. Was switched to lisinopril 10 mg HS  2.  Labs / imaging needed at time of follow-up: CD4, VL, genotype, [id number], HCV and RPR (these were to be drawn at HD, but patient left AMA).  3.  Pending labs/ test needing follow-up: Protein electrophoresis  Follow-up Appointments: Dr. Drue SecondSnider in 3 weeks  Discharge Instructions: Patient left AMA but risks of leaving were explained.  Consultations: Treatment Team:  Garnetta BuddyMartin W Webb, MD, Infectious disease   Admission HPI: [name] is a 33 yo male with  PMHx of Sickle Cell, ESRD on HD TTS, HIV, HTN, and pancreatitis who presents to the ED with a 2 day history of worsening nausea and vomiting and one day history of worsening pain in his joints and back. He has been unable to keep medications or food down for 3 days. Patient has a history of Sickle Cell with frequent crises, 4 in the last year. Patient states it is typical for his pain crises to originally present with nausea and vomiting and to progress to pain. He admits to recent sick contacts but denies any fever, chills, nasal congestion, sinus pressure, cough, chest pain or shortness of breath. Patient has never had priapism. He takes  hydroxyurea, folate and dilaudid at home for his sickle cell, but has not been able to take them or any of his other medications in 3 days.   Hospital Course by problem list: Principal Problem:   Sickle cell anemia with pain Active Problems:   DM (diabetes mellitus)   HTN (hypertension)   Sickle cell crisis   ESRD (end stage renal disease) on dialysis   AIDS (acquired immune deficiency syndrome)   Sickle Cell Crisis: Patient presented with nausea, vomiting, and diffuse and severe myalgias and arthralgias. Patient reports that he has SCD and may be in a crisis. Patient states that he takes dilaudid 8 mg Q4H prn pain, folic acid 1 mg daily, and hydroxyurea 500 mg BID at home. Patient had not taken these medications in 3 days due to nausea and vomiting. On admission, vital signs showed the patient was afebrile, hypertensive at 188/85, tachycardic at 101, RR 20 and satting 100% on room air. Reticulocyte count normal and Hgb >7. Patient was given a total of 5 mg IV of dilaudid over the course of 4 hours which did not control his pain. Phenergan minimally improved nausea. Patient may not be in a complete sickle cell crisis as his hemoglobin is at baseline, 7.5 and reticulocyte count is 1.1. Patient was originally treated with Dilaudid 2 mg IV Q2H prn, Benadryl 12.5 mg Q6H prn, Folic Acid 1 mg QD, Hydroxyurea 500 mg BID, Phenergan 12.5 mg Q6H prn. We held his hydroxyurea since his wbc was low. Pain improved over the course of several days and pain medication was weaned down. Patient resisted the taper and left AMA. Patient follows with Dr. Raford Pitcher, Hem/Onc in [place]; this doctor is no longer at [facility], could not locate to obtain records.   Nausea & Vomiting: Of unclear cause. Seen to have pizza and chicken wings at bedside during admission despite clear liquids order. May have been due to pain medications or gastroenteritis.   ESRD: Patient gets HD TTS, but missed his dialysis session on Saturday due to pain  and could not complete his full HD session on Thursday secondary to nausea and vomiting. Potassium 3.9 and patient does not appear to be volume overloaded on admission. This was discussed with Nephrology who agreed that the patient did not need emergent dialysis. Patient was dialyzed on Monday 11/17/14 and was scheduled to receive dialysis on 12/1 before he left AMA.  HIV: Patient was first diagnosed with HIV in 2003. He currently does not see an ID physician but was previously on anti-retrovirals and taken off. Last CD4 count was 100 on 08/28/14 and HIV RNA 28,818 on 08/28/2014. Patient denies cough or diarrhea. ID was consulted and will see as outpatient to initiate HAART. Bactrim was initiated on this admission for PJP ppx.   HTN: Hypertensive on admission at  188/85. Patient is on lisinopril 10 mg daily at home, but missed last 3 days due to nausea and vomiting. We continued Lisinopril 10 mg daily and Hydralazine 10 mg IV Q8H prn SBP >190 and/or DBP >110. We monitored blood pressure post-dialysis.   Discharge Vitals:   BP 147/81 mmHg  Pulse 95  Temp(Src) 98.1 F (36.7 C) (Oral)  Resp 18  Ht 5\' 7"  (1.702 m)  Wt 85.4 kg (188 lb 4.4 oz)  BMI 29.48 kg/m2  SpO2 100%  Discharge Labs:  Results for orders placed or performed during the hospital encounter of 11/15/14 (from the past 24 hour(s))  CBC     Status: Abnormal   Collection Time: 11/17/14  7:40 AM  Result Value Ref Range   WBC 2.6 (L) 4.0 - 10.5 K/uL   RBC 2.54 (L) 4.22 - 5.81 MIL/uL   Hemoglobin 7.1 (L) 13.0 - 17.0 g/dL   HCT 16.123.5 (L) 09.639.0 - 04.552.0 %   MCV 92.5 78.0 - 100.0 fL   MCH 28.0 26.0 - 34.0 pg   MCHC 30.2 30.0 - 36.0 g/dL   RDW 40.917.7 (H) 81.111.5 - 91.415.5 %   Platelets 160 150 - 400 K/uL  Basic metabolic panel     Status: Abnormal   Collection Time: 11/17/14  7:40 AM  Result Value Ref Range   Sodium 139 137 - 147 mEq/L   Potassium 4.5 3.7 - 5.3 mEq/L   Chloride 99 96 - 112 mEq/L   CO2 25 19 - 32 mEq/L   Glucose, Bld 81 70 - 99  mg/dL   BUN 42 (H) 6 - 23 mg/dL   Creatinine, Ser 78.2911.87 (H) 0.50 - 1.35 mg/dL   Calcium 8.0 (L) 8.4 - 10.5 mg/dL   GFR calc non Af Amer 5 (L) >90 mL/min   GFR calc Af Amer 6 (L) >90 mL/min   Anion gap 15 5 - 15  Hepatitis B surface antigen     Status: None   Collection Time: 11/17/14  8:41 AM  Result Value Ref Range   Hepatitis B Surface Ag NEGATIVE NEGATIVE  Hepatitis B core antibody, total     Status: None   Collection Time: 11/17/14  8:41 AM  Result Value Ref Range   Hep B Core Total Ab NON REACTIVE NON REACTIVE    Signed: Eleonore ChiquitoJulie Unique Searfoss, MD

## 2014-11-17 NOTE — Procedures (Deleted)
I was present at this dialysis session, have reviewed the session itself and made  appropriate changes  Vinson Moselleob Amsi Grimley MD (pgr) [phone]    ([phone]) [phone] 11/17/2014, 11:49 AM

## 2014-11-17 NOTE — Consult Note (Addendum)
[facility] for Infectious Disease  Total days of antibiotics 0               Reason for Consult: establish hiv care    Referring Physician: grandfortuna  Active Problems:   Sickle cell anemia with pain   Sickle cell crisis   Nausea with vomiting   ESRD (end stage renal disease) on dialysis    HPI: [name] is a 33 y.o. male with HIV disease off of ART, and sickle cell disease as well as end-stage renal disease on dialysis secondary to poorly controlled, hypertension. He is not diabetic. He presents with sickle crisis but also recently admits to wanting to get established with HIV care. He reports that he was diagnosed in 2002. He thinks it was related to blood transfusions. He is was last on HAART last year, previously on ziagen, kaletra, but then does not remember what he was taking.   Past Medical History  Diagnosis Date  . Sickle cell anemia     States Dx at 33 yo, s/p multiple prior transfusions, typically with pain in legs, back  . HIV (human immunodeficiency virus infection) 2003    Followed by Patricia Nettle at [facility] [place]   . HTN (hypertension)   . Pulmonary mass     Unclear history. States PCP was following a pulmonary mass with serial imaging  . Pancreatitis 2012    Denies any alcohol use   . Hypertension   . Blood transfusion     "several"  . End stage renal disease on dialysis HD in 2009     Followed by Dr. Viann Shove in [place]. States due to HTN/DM.   Marland Kitchen ESRD (end stage renal disease) on dialysis     "[facility]; [place], [place]; TTS" (08/26/2014)  . Renal failure     Allergies: No Known Allergies   MEDICATIONS: . antiseptic oral rinse  7 mL Mouth Rinse BID  . calcium acetate  2,001 mg Oral TID WC  . [START ON 11/18/2014] darbepoetin (ARANESP) injection - DIALYSIS  60 mcg Intravenous Q Tue-HD  . docusate sodium  100 mg Oral BID  . folic acid  1 mg Oral Daily  . folic acid  1 mg Intravenous Once  . heparin  5,000 Units Subcutaneous  3 times per day  . lisinopril  10 mg Oral Daily  . multivitamin  1 tablet Oral QHS    History  Substance Use Topics  . Smoking status: Current Every Day Smoker -- 0.12 packs/day for 18 years    Types: Cigarettes  . Smokeless tobacco: Never Used  . Alcohol Use: No  - unemployed on disability. Has medicaid. Not in a relationship. Lives by himself  Family History  Problem Relation Age of Onset  . Hypertension Father   . Diabetes      Uncles and grandfather  . Cervical cancer Mother     Dec 46yo of cervical ca    Review of Systems  Constitutional: Negative for fever, chills, diaphoresis, activity change, appetite change, fatigue and unexpected weight change.  HENT: Negative for congestion, sore throat, rhinorrhea, sneezing, trouble swallowing and sinus pressure.  Eyes: Negative for photophobia and visual disturbance.  Respiratory: Negative for cough, chest tightness, shortness of breath, wheezing and stridor.  Cardiovascular: Negative for chest pain, palpitations and leg swelling.  Gastrointestinal: Negative for nausea, vomiting, abdominal pain, diarrhea, constipation, blood in stool, abdominal distention and anal bleeding.  Genitourinary: Negative for dysuria, hematuria, flank pain and difficulty  urinating.  Musculoskeletal: Negative for myalgias, back pain, joint swelling, arthralgias and gait problem.  Skin: Negative for color change, pallor, rash and wound.  Neurological: Negative for dizziness, tremors, weakness and light-headedness.  Hematological: Negative for adenopathy. Does not bruise/bleed easily.  Psychiatric/Behavioral: Negative for behavioral problems, confusion, sleep disturbance, dysphoric mood, decreased concentration and agitation.     OBJECTIVE: Temp:  [97.9 F (36.6 C)-98.1 F (36.7 C)] 98.1 F (36.7 C) (11/30 1155) Pulse Rate:  [87-105] 95 (11/30 1155) Resp:  [18] 18 (11/30 1155) BP: (134-191)/(81-103) 147/81 mmHg (11/30 1155) SpO2:  [100 %] 100 % (11/30  1155) Weight:  [188 lb 4.4 oz (85.4 kg)] 188 lb 4.4 oz (85.4 kg) (11/30 1155) Physical Exam  Constitutional: He is oriented to person, place, and time. He appears well-developed and well-nourished. No distress.  HENT:  Mouth/Throat: Oropharynx is clear and moist. No oropharyngeal exudate.  Cardiovascular: Normal rate, regular rhythm and normal heart sounds. Exam reveals no gallop and no friction rub.  No murmur heard.  Pulmonary/Chest: Effort normal and breath sounds normal. No respiratory distress. He has no wheezes.  Abdominal: Soft. Bowel sounds are normal. He exhibits no distension. There is no tenderness.  Lymphadenopathy:  He has no cervical adenopathy.  Neurological: He is alert and oriented to person, place, and time.  Skin: Skin is warm and dry. Flaking and excoriation Psychiatric: He has a normal mood and affect. His behavior is normal.    LABS: Results for orders placed or performed during the hospital encounter of 11/15/14 (from the past 48 hour(s))  CBC WITH DIFFERENTIAL     Status: Abnormal   Collection Time: 11/15/14  1:40 PM  Result Value Ref Range   WBC 2.7 (L) 4.0 - 10.5 K/uL   RBC 2.67 (L) 4.22 - 5.81 MIL/uL   Hemoglobin 7.5 (L) 13.0 - 17.0 g/dL   HCT 24.3 (L) 39.0 - 52.0 %   MCV 91.0 78.0 - 100.0 fL   MCH 28.1 26.0 - 34.0 pg   MCHC 30.9 30.0 - 36.0 g/dL   RDW 17.6 (H) 11.5 - 15.5 %   Platelets 161 150 - 400 K/uL   Neutrophils Relative % 62 43 - 77 %   Lymphocytes Relative 25 12 - 46 %   Monocytes Relative 10 3 - 12 %   Eosinophils Relative 2 0 - 5 %   Basophils Relative 1 0 - 1 %   Neutro Abs 1.6 (L) 1.7 - 7.7 K/uL   Lymphs Abs 0.7 0.7 - 4.0 K/uL   Monocytes Absolute 0.3 0.1 - 1.0 K/uL   Eosinophils Absolute 0.1 0.0 - 0.7 K/uL   Basophils Absolute 0.0 0.0 - 0.1 K/uL   RBC Morphology POLYCHROMASIA PRESENT   Comprehensive metabolic panel     Status: Abnormal   Collection Time: 11/15/14  1:40 PM  Result Value Ref Range   Sodium 136 (L) 137 - 147 mEq/L    Potassium 3.9 3.7 - 5.3 mEq/L   Chloride 96 96 - 112 mEq/L   CO2 25 19 - 32 mEq/L   Glucose, Bld 96 70 - 99 mg/dL   BUN 35 (H) 6 - 23 mg/dL   Creatinine, Ser 9.17 (H) 0.50 - 1.35 mg/dL   Calcium 8.3 (L) 8.4 - 10.5 mg/dL   Total Protein 9.0 (H) 6.0 - 8.3 g/dL   Albumin 2.5 (L) 3.5 - 5.2 g/dL   AST 20 0 - 37 U/L   ALT 6 0 - 53 U/L   Alkaline Phosphatase 130 (  H) 39 - 117 U/L   Total Bilirubin 0.4 0.3 - 1.2 mg/dL   GFR calc non Af Amer 7 (L) >90 mL/min   GFR calc Af Amer 8 (L) >90 mL/min    Comment: (NOTE) The eGFR has been calculated using the CKD EPI equation. This calculation has not been validated in all clinical situations. eGFR's persistently <90 mL/min signify possible Chronic Kidney Disease.    Anion gap 15 5 - 15  Reticulocytes     Status: Abnormal   Collection Time: 11/15/14  1:40 PM  Result Value Ref Range   Retic Ct Pct 1.1 0.4 - 3.1 %   RBC. 2.67 (L) 4.22 - 5.81 MIL/uL   Retic Count, Manual 29.4 19.0 - 186.0 K/uL  Lipase, blood     Status: None   Collection Time: 11/15/14  1:40 PM  Result Value Ref Range   Lipase 27 11 - 59 U/L  Comprehensive metabolic panel     Status: Abnormal   Collection Time: 11/16/14  3:55 AM  Result Value Ref Range   Sodium 138 137 - 147 mEq/L   Potassium 3.8 3.7 - 5.3 mEq/L   Chloride 97 96 - 112 mEq/L   CO2 25 19 - 32 mEq/L   Glucose, Bld 90 70 - 99 mg/dL   BUN 37 (H) 6 - 23 mg/dL   Creatinine, Ser 10.20 (H) 0.50 - 1.35 mg/dL   Calcium 7.9 (L) 8.4 - 10.5 mg/dL   Total Protein 8.2 6.0 - 8.3 g/dL   Albumin 2.2 (L) 3.5 - 5.2 g/dL   AST 17 0 - 37 U/L   ALT 5 0 - 53 U/L   Alkaline Phosphatase 125 (H) 39 - 117 U/L   Total Bilirubin 0.3 0.3 - 1.2 mg/dL   GFR calc non Af Amer 6 (L) >90 mL/min   GFR calc Af Amer 7 (L) >90 mL/min    Comment: (NOTE) The eGFR has been calculated using the CKD EPI equation. This calculation has not been validated in all clinical situations. eGFR's persistently <90 mL/min signify possible Chronic  Kidney Disease.    Anion gap 16 (H) 5 - 15  CBC     Status: Abnormal   Collection Time: 11/16/14  3:55 AM  Result Value Ref Range   WBC 2.7 (L) 4.0 - 10.5 K/uL   RBC 2.58 (L) 4.22 - 5.81 MIL/uL   Hemoglobin 7.2 (L) 13.0 - 17.0 g/dL   HCT 23.6 (L) 39.0 - 52.0 %   MCV 91.5 78.0 - 100.0 fL   MCH 27.9 26.0 - 34.0 pg   MCHC 30.5 30.0 - 36.0 g/dL   RDW 17.6 (H) 11.5 - 15.5 %   Platelets 167 150 - 400 K/uL  Magnesium     Status: None   Collection Time: 11/16/14  3:55 AM  Result Value Ref Range   Magnesium 2.3 1.5 - 2.5 mg/dL  Phosphorus     Status: Abnormal   Collection Time: 11/16/14  3:55 AM  Result Value Ref Range   Phosphorus 4.7 (H) 2.3 - 4.6 mg/dL  CBC     Status: Abnormal   Collection Time: 11/17/14  7:40 AM  Result Value Ref Range   WBC 2.6 (L) 4.0 - 10.5 K/uL   RBC 2.54 (L) 4.22 - 5.81 MIL/uL   Hemoglobin 7.1 (L) 13.0 - 17.0 g/dL   HCT 23.5 (L) 39.0 - 52.0 %   MCV 92.5 78.0 - 100.0 fL   MCH 28.0 26.0 - 34.0 pg  MCHC 30.2 30.0 - 36.0 g/dL   RDW 17.7 (H) 11.5 - 15.5 %   Platelets 160 150 - 400 K/uL  Basic metabolic panel     Status: Abnormal   Collection Time: 11/17/14  7:40 AM  Result Value Ref Range   Sodium 139 137 - 147 mEq/L   Potassium 4.5 3.7 - 5.3 mEq/L   Chloride 99 96 - 112 mEq/L   CO2 25 19 - 32 mEq/L   Glucose, Bld 81 70 - 99 mg/dL   BUN 42 (H) 6 - 23 mg/dL   Creatinine, Ser 11.87 (H) 0.50 - 1.35 mg/dL   Calcium 8.0 (L) 8.4 - 10.5 mg/dL   GFR calc non Af Amer 5 (L) >90 mL/min   GFR calc Af Amer 6 (L) >90 mL/min    Comment: (NOTE) The eGFR has been calculated using the CKD EPI equation. This calculation has not been validated in all clinical situations. eGFR's persistently <90 mL/min signify possible Chronic Kidney Disease.    Anion gap 15 5 - 15     Assessment/Plan:  33yo M with HIV disease, CD 4 count of 100 (24%)/VL 28,818 in Sep 2015  - will check cd 4 count, viral load, genotype, [id number], hcv, rpr - will get records from prior clinic to  be sent to [facility] - start him on PJP prophylaxis with bactrim SS daily - we will see him back in the ID clinic in 3 wk to start HAART, likely a pi based regimen especially if he has noncompliance issues - sickle crisis = improved per patient report - ESRD on HD = will ensure that his HIV regimen will be renally dosed - dry skin = recommend eucerin bid  Katianna Mcclenney B[facility] [phone]

## 2014-11-18 ENCOUNTER — Inpatient Hospital Stay: Payer: Self-pay | Admitting: Internal Medicine

## 2014-11-18 LAB — COMPREHENSIVE METABOLIC PANEL
ANION GAP: 11 (ref 7–16)
Albumin: 2.4 g/dL — ABNORMAL LOW (ref 3.4–5.0)
Alkaline Phosphatase: 145 U/L — ABNORMAL HIGH
BILIRUBIN TOTAL: 0.5 mg/dL (ref 0.2–1.0)
BUN: 28 mg/dL — AB (ref 7–18)
CALCIUM: 8 mg/dL — AB (ref 8.5–10.1)
Chloride: 98 mmol/L (ref 98–107)
Co2: 27 mmol/L (ref 21–32)
Creatinine: 9.15 mg/dL — ABNORMAL HIGH (ref 0.60–1.30)
GFR CALC AF AMER: 9 — AB
GFR CALC NON AF AMER: 7 — AB
Glucose: 156 mg/dL — ABNORMAL HIGH (ref 65–99)
Osmolality: 281 (ref 275–301)
Potassium: 4 mmol/L (ref 3.5–5.1)
SGOT(AST): 27 U/L (ref 15–37)
SGPT (ALT): 10 U/L — ABNORMAL LOW
Sodium: 136 mmol/L (ref 136–145)
Total Protein: 9.3 g/dL — ABNORMAL HIGH (ref 6.4–8.2)

## 2014-11-18 LAB — CBC
HCT: 24.9 % — AB (ref 40.0–52.0)
HGB: 8.1 g/dL — AB (ref 13.0–18.0)
MCH: 30.2 pg (ref 26.0–34.0)
MCHC: 32.4 g/dL (ref 32.0–36.0)
MCV: 93 fL (ref 80–100)
PLATELETS: 151 10*3/uL (ref 150–440)
RBC: 2.67 10*6/uL — ABNORMAL LOW (ref 4.40–5.90)
RDW: 19.2 % — ABNORMAL HIGH (ref 11.5–14.5)
WBC: 2.8 10*3/uL — AB (ref 3.8–10.6)

## 2014-11-18 LAB — RETICULOCYTES
ABSOLUTE RETIC COUNT: 0.033 10*6/uL (ref 0.019–0.186)
Reticulocyte: 1.2 % (ref 0.4–3.1)

## 2014-11-18 MED ORDER — LISINOPRIL 10 MG PO TABS: 10.0000 mg | ORAL_TABLET | Freq: Every day | ORAL | Status: DC

## 2014-11-18 MED ORDER — HYDROMORPHONE HCL 1 MG/ML IJ SOLN
INTRAMUSCULAR | Status: AC
  Administered 2014-11-18: 1 mg via INTRAVENOUS
  Filled 2014-11-18: qty 1

## 2014-11-18 MED ORDER — HYDROMORPHONE HCL 1 MG/ML IJ SOLN: 1.0000 mg | Freq: Four times a day (QID) | INTRAMUSCULAR | Status: DC | PRN

## 2014-11-18 MED ORDER — PROMETHAZINE HCL 25 MG/ML IJ SOLN
12.5000 mg | Freq: Four times a day (QID) | INTRAMUSCULAR | Status: DC | PRN
  Administered 2014-11-18: 12.5 mg via INTRAVENOUS
  Filled 2014-11-18: qty 1

## 2014-11-18 MED ORDER — HYDROMORPHONE HCL 1 MG/ML IJ SOLN
1.0000 mg | INTRAMUSCULAR | Status: DC | PRN
  Administered 2014-11-18: 1 mg via INTRAVENOUS

## 2014-11-18 MED ORDER — DIPHENHYDRAMINE HCL 12.5 MG/5ML PO ELIX
12.5000 mg | ORAL_SOLUTION | Freq: Four times a day (QID) | ORAL | Status: DC | PRN
  Administered 2014-11-18: 12.5 mg via ORAL
  Filled 2014-11-18 (×3): qty 5

## 2014-11-18 MED ORDER — SODIUM CHLORIDE 0.9 % IV BOLUS (SEPSIS)
250.0000 mL | Freq: Once | INTRAVENOUS | Status: AC
  Administered 2014-11-18: 250 mL via INTRAVENOUS

## 2014-11-18 NOTE — Plan of Care (Signed)
Problem: Phase II Progression Outcomes Goal: Tolerating diet Outcome: Completed/Met Date Met:  11/18/14

## 2014-11-18 NOTE — Progress Notes (Signed)
Patient refused dialysis earlier, but now willing to go. Dialysis notified.

## 2014-11-18 NOTE — Progress Notes (Signed)
When returned to room to tell patient IV dilaudid was not reordered, patient stated he wanted to leave AMA. Dr. Dionne AnoJulia Mallory notified. MD to round on patient. Patient agreed to wait for MD to round.

## 2014-11-18 NOTE — Progress Notes (Signed)
Patient refused Hemodialysis tx x2. Patient states " I'm leaving the hospital.

## 2014-11-18 NOTE — Progress Notes (Signed)
  Date: 11/18/2014  Patient name: [name]  Medical record number: [id number]  Date of birth: 1981/04/02   This patient has been seen and the plan of care was discussed with the house staff. Please see their note for complete details. I concur with their findings with the following additions/corrections: I saw [name] with Drs Gordy Levan and Sherrine Maples. He started vomiting this AM and had 3 bags with emesis. He got PO phenergan but threw it up. Opioids had been changed to PO oxycodone. He was stating he was leaving AMA. HD planned for AM.   1. N/V - etiology not clear Labs not sig on admit except alk phos. Had been eating anything he wanted. Has had episodes in past. Symptomatic tx for now.   2. Pain - not clear that he was in crisis on admit. Ran name through [facility] and no opioids for 12 months. No records available in [facility] anywhere. It is strange bc it he were misusing opioids, he would likely have gotten freq Rx for opioids which he doesn't. But if he does have SS, we cannot find old records. At this point, he has objective findings of GI distress and will need HD prior to D/C. Will Rx IV opioids, understanding they might slow gastric emptying, if remains on clears only and gets HD in AM. Reassess pain in AM.   3. Possible Sickle Cell - await electrophoresis. No records available. Arrange F/U.   D/C home once able to take solid PO and vomiting stops.   Bartholomew Crews, MD 11/18/2014, 3:01 PM

## 2014-11-18 NOTE — Progress Notes (Signed)
Notified Dr. Dionne AnoJulia Mallory that patient wants to leave AMA r/t dose of dilaudid. Dr. Leatha GildingMallory to round on patient ASAP.

## 2014-11-18 NOTE — Progress Notes (Signed)
Offered assistance with bath, Pt declined and stated he felt sick and would like to wait. Tech will reoffer bath later.

## 2014-11-18 NOTE — Progress Notes (Signed)
1. Subjective:  No cos , tolerated hd yesterday/  Today  hd to get back  on tts schedule  Objective Vital signs in last 24 hours: Filed Vitals:   11/17/14 1800 11/17/14 2229 11/18/14 0613 11/18/14 0913  BP: 149/47 113/63 129/91 106/61  Pulse: 98 91 100 93  Temp: 97.9 F (36.6 C) 98.7 F (37.1 C) 98.8 F (37.1 C) 99.2 F (37.3 C)  TempSrc: Oral Oral Oral Oral  Resp: 18 18 20 20   Height:      Weight:      SpO2: 100% 100% 99% 100%   Weight change:  Physical Exam: General: alert NAD OX4 Heart: RRR, no rub, gallop or mur Lungs: CTA bilat Abdomen:Obese BS pos. ,soft, NT, ND Extremities:Bipedal trace edema with dry flaky skin Dialysis Access: L Ua avgg faint bruit  Dialysis Orders: Center: [facility] [facility] [place] - per pt 3.5 hours and EDW 82.5- during his September visit here, he said his EDW was 6880 and he was on 4 hrs.  Problem/Plan: 1. Sickle cell crisis- per Admit team  2. N and V with hx pancreatitis on CL/   Yesterday per notes from staff RN "[employer] At Night ,family bringing in" 3. ESRD - TTS -  Prior to ADMIT last HD [place] Thursday [place] in [place] per Pt. - plan HD today  For normal schedule : [place] [facility] - family member drives him. He tells me this am " No problem getting back on HD in [place] unit "'just make some calls there and sign their papers I'll Get back on schedule there."" At some point wants to change HD to here but hasn't made arrangements yet and not very interested in making the transition now. (would need to review information/attendance records from [place]- during that admission he was noted to have had very fragmented health care with admissions to multiple hospitals and drug seeking behaviors) We spoke w staff at [place] in [place], [place], on 11/30 and they said that they will take him back upon d/c from the hospital and requested that we fax them information about this hospital stay at the time of discharge; they do not  require any other paperwork ahead of time.  4. Hypertension/volume - BP 106/61 this am meds=lisinopril 10mg  q d / change to hs and hold sbp< 110 on hd with  vol decr yesterday4liters/ still 5 kg up per wts/ hd today  5. Anemia - Hgb 7.2>7.1 - tells me gets transfused when HGB below 6 / start Aranesp 60 mcg q weekly Tues HD 6. Metabolic bone disease - continue phoslo Ca 9.6 corrected Phos 4.7/ thinks he was getting IV vit d at op Hd will check Ipth and then dose vit d 7. Nutrition - Alb 2.2 / per admit team CL at present sec N/V/ on renal vit. 8. HIV - per primary  Lenny Pastelavid Zeyfang, PA-C [facility] Beeper [phone] 11/18/2014,10:41 AM  LOS: 3 days   Pt seen, examined, agree w assess/plan as above with additions as indicated.  Vinson Moselleob Kalieb Freeland MD pager [phone]    cell [phone] 11/18/2014, 1:21 PM     Labs: Basic Metabolic Panel:  Recent Labs Lab 11/15/14 1340 11/16/14 0355 11/17/14 0740  NA 136* 138 139  K 3.9 3.8 4.5  CL 96 97 99  CO2 25 25 25   GLUCOSE 96 90 81  BUN 35* 37* 42*  CREATININE 9.17* 10.20* 11.87*  CALCIUM 8.3* 7.9* 8.0*  PHOS  --  4.7*  --    Liver Function Tests:  Recent Labs Lab 11/15/14 1340 11/16/14 0355  AST 20 17  ALT 6 5  ALKPHOS 130* 125*  BILITOT 0.4 0.3  PROT 9.0* 8.2  ALBUMIN 2.5* 2.2*    Recent Labs Lab 11/15/14 1340  LIPASE 27  CBC:  Recent Labs Lab 11/15/14 1340 11/16/14 0355 11/17/14 0740  WBC 2.7* 2.7* 2.6*  NEUTROABS 1.6*  --   --   HGB 7.5* 7.2* 7.1*  HCT 24.3* 23.6* 23.5*  MCV 91.0 91.5 92.5  PLT 161 167 160  Medications:   . antiseptic oral rinse  7 mL Mouth Rinse BID  . calcium acetate  2,001 mg Oral TID WC  . darbepoetin (ARANESP) injection - DIALYSIS  60 mcg Intravenous Q Tue-HD  . docusate sodium  100 mg Oral BID  . folic acid  1 mg Oral Daily  . folic acid  1 mg Intravenous Once  . heparin  5,000 Units Subcutaneous 3 times per day  . hydrocerin   Topical BID  . lisinopril  10 mg  Oral Daily  . multivitamin  1 tablet Oral QHS  . sodium chloride  250 mL Intravenous Once  . sulfamethoxazole-trimethoprim  1 tablet Oral Daily

## 2014-11-18 NOTE — Progress Notes (Signed)
Bolus held as BP entered incorrectly at 0918. BP corrected by April, nurse tech, after bolus orders entered. Sticky note not updated with paged numbers, unable to find Dionne AnoJulia Mallory, MD on amion. Paged internal medicine multiple times to clarify order. Able to obtain pager number at 1100am. Order clarified and bolus to be given.

## 2014-11-18 NOTE — Progress Notes (Addendum)
Patient pulled out IV and refused to wait for Dr. Leatha GildingMallory to round. Patient educated on implications of leaving AMA and not receiving dialysis. Patient stated understanding, but still wanted to leave AMA. AMA paperwork signed.

## 2014-11-18 NOTE — Progress Notes (Signed)
Subjective: Mr. Krauter was initially interviewed and examined with the entire team: He complained of continuing nausea and vomiting (x2 this morning). He continues to only eat ice chips, stating that even his clear liquid diet does not sound appealing to him. BM normal (last yesterday).  Called later in the am by RN about patient's complaint about his pain medication, which had been switched from IV dilaudid to PO percocet. Threatened to leave AMA. Visited the patient again: Patient now complaining of 8/10 joint pain in his entire lower body. States it only started once he got out of bed mid-morning. Discussed fact that pain meds may be contributing to nausea/vomiting and necessity of transitioning to PO as soon as possible. States that he will be ready to transition to PO medications tomorrow, that he knows how to control his pain.   Objective: Vital signs in last 24 hours: Filed Vitals:   11/17/14 1800 11/17/14 2229 11/18/14 0613 11/18/14 0913  BP: 149/47 113/63 129/91 106/61  Pulse: 98 91 100 93  Temp: 97.9 F (36.6 C) 98.7 F (37.1 C) 98.8 F (37.1 C) 99.2 F (37.3 C)  TempSrc: Oral Oral Oral Oral  Resp: 18 18 20 20   Height:      Weight:      SpO2: 100% 100% 99% 100%   Weight change:   Intake/Output Summary (Last 24 hours) at 11/18/14 1330 Last data filed at 11/18/14 1015  Gross per 24 hour  Intake    120 ml  Output    200 ml  Net    -80 ml   Appearance: in NAD, lying in bed watching TV HEENT: AT/[place], PERRL, EOMi, MMM Heart: RRR, normal S1S2 with no MRG Lungs: CTAB, no WRR Abdomen: BS+, soft, nontender, nondistended Musculoskeletal: full ROM Extremities: joints tender to palpation, no lower extremity edema, AVF graft in LUE Neurologic: A&Ox3, grossly intact Skin: warm, dry, intact, no skin tenting, no rashes or lesions  Lab Results: Basic Metabolic Panel:  Recent Labs Lab 11/16/14 0355 11/17/14 0740  NA 138 139  K 3.8 4.5  CL 97 99  CO2 25 25  GLUCOSE  90 81  BUN 37* 42*  CREATININE 10.20* 11.87*  CALCIUM 7.9* 8.0*  MG 2.3  --   PHOS 4.7*  --    Liver Function Tests:  Recent Labs Lab 11/15/14 1340 11/16/14 0355  AST 20 17  ALT 6 5  ALKPHOS 130* 125*  BILITOT 0.4 0.3  PROT 9.0* 8.2  ALBUMIN 2.5* 2.2*    Recent Labs Lab 11/15/14 1340  LIPASE 27   CBC:  Recent Labs Lab 11/15/14 1340 11/16/14 0355 11/17/14 0740  WBC 2.7* 2.7* 2.6*  NEUTROABS 1.6*  --   --   HGB 7.5* 7.2* 7.1*  HCT 24.3* 23.6* 23.5*  MCV 91.0 91.5 92.5  PLT 161 167 160   Anemia Panel:  Recent Labs Lab 11/15/14 1340  RETICCTPCT 1.1   Medications: I have reviewed the patient's current medications. Scheduled Meds: . antiseptic oral rinse  7 mL Mouth Rinse BID  . calcium acetate  2,001 mg Oral TID WC  . darbepoetin (ARANESP) injection - DIALYSIS  60 mcg Intravenous Q Tue-HD  . docusate sodium  100 mg Oral BID  . folic acid  1 mg Oral Daily  . heparin  5,000 Units Subcutaneous 3 times per day  . hydrocerin   Topical BID  . [START ON 11/19/2014] lisinopril  10 mg Oral QHS  . multivitamin  1 tablet Oral QHS  .  sodium chloride  250 mL Intravenous Once  . sulfamethoxazole-trimethoprim  1 tablet Oral Daily   Continuous Infusions:  PRN Meds:.diphenhydrAMINE, hydrALAZINE, HYDROmorphone (DILAUDID) injection, promethazine Assessment/Plan: Principal Problem:   Sickle cell anemia with pain Active Problems:   DM (diabetes mellitus)   HTN (hypertension)   Sickle cell crisis   ESRD (end stage renal disease) on dialysis   AIDS (acquired immune deficiency syndrome)   Mr. Milta DeitersSlaughter is a 33 yo man with AIDS, ESRD and SCD who presented with nausea, vomiting and lower extremity joint pain. He continues to have all of these symptoms.   Sickle Cell Crisis: Patient presented with nausea, vomiting, and diffuse and severe myalgias and arthralgias. Holding hydroxyurea due to low WBC. He still continues to call for pain medication and is demanding IV pain  control of 8/10 pain. He follows with Dr. Raford PitcherBarrett hematologist/oncologist in [place] and states that this is the only physician who prescribes him dilaudid. Placed call to Dr. Laveda NormanBarrett's office today regarding pain regimen; [facility]. Hep B surface antigen negative and hep B core antibody non-reactive. - Will continue to attempt to locate patient's pain regimen by attempting to locate Dr. Raford PitcherBarrett - Resume Dilaudid at 1 mg IV Q4H; plan to transition tomorrow to PO - D/c Perocet 5/325 1-2 tabs Q6H prn for now  - Benadryl 12.5 mg Q6H prn - Folic Acid 1 mg QD - CBC/BMET tomorrow am - Colace 100 mg BID  Nausea and Vomiting: Patient was unable to tolerate a clear liquid diet and had evidence of emesis on our visit this morning.  - IVF 250mL bolus slowly given that patient has not been eating or drinking - Clear Diet Liquid; will attempt to advance tomorrow  - Phenergan 12.5 mg Q6H switched from PO-->IV in context of vomiting  - Zofran 4 mg Q8H prn  ESRD: Patient gets HD TTS, but missed his dialysis session on Saturday. Patient is getting dialyzed today. Patient follows with a nephrologist in [place]. Not interested in changing locations at this time.  - BMET tomorrow am - Daily weights - Rena-vit - Nephrology following, appreciate recommendations  HIV: Patient was first diagnosed with HIV in 2003. He currently does not see an ID physician but was previously on anti-retrovirals and taken off. Last CD4 count was 100 on 08/28/14 and HIV RNA 28,818 on 08/28/2014. Patient denies cough or diarrhea. - Monitor - Appreciate ID consult - CD4, VL, genotype, [id number], HCV and RPR to be drawn at HD today - Dr. Drue SecondSnider to follow in clinic in 3 weeks to start HAART, likely a pi based regimen - Continue bactrim for PJP ppx  HTN: Currently 106/61, but has been hypertensive on this admission. Patient is on lisinopril 10 mg daily at home.  - Change Lisinopril 10 mg to HS; hold for SBP  <110 - Hydralazine 10 mg IV Q8H prn SBP >190 and/or DBP >110 - Monitor BP post dialysis  Anemia: Hgb 7.2-->7.1; gets transfused below 6. - Start aranesp 60 mcg q weekly with Tuesday HD  Metabolic Bone Disease: Ca is 9.6 corrected with phos 4.7.   - Continue phoslo  - Dose vitamin D per renal  DVT/PE ppx: Heparin TID  Diet:  - CL; will advance as tolerated  Dispo: Disposition is deferred at this time, awaiting improvement of current medical problems.  Anticipated discharge in approximately 1 day(s).   The patient does not have a current PCP (Pcp Not In System) and does not need an  [facility] follow[facility] appointment after discharge.  The patient does not have transportation limitations that hinder transportation to clinic appointments.  .Services Needed at time of discharge: Y = Yes, Blank = No PT:   OT:   RN:   Equipment:   Other:     LOS: 3 days   Dionne AnoJulia Logan Vegh, MD 11/18/2014, 1:30 PM

## 2014-11-19 LAB — BASIC METABOLIC PANEL
ANION GAP: 9 (ref 7–16)
BUN: 29 mg/dL — ABNORMAL HIGH (ref 7–18)
CHLORIDE: 99 mmol/L (ref 98–107)
CO2: 27 mmol/L (ref 21–32)
CREATININE: 9.59 mg/dL — AB (ref 0.60–1.30)
Calcium, Total: 8.3 mg/dL — ABNORMAL LOW (ref 8.5–10.1)
GFR CALC AF AMER: 8 — AB
GFR CALC NON AF AMER: 7 — AB
Glucose: 73 mg/dL (ref 65–99)
OSMOLALITY: 275 (ref 275–301)
Potassium: 3.8 mmol/L (ref 3.5–5.1)
Sodium: 135 mmol/L — ABNORMAL LOW (ref 136–145)

## 2014-11-19 LAB — PHOSPHORUS: Phosphorus: 4.6 mg/dL (ref 2.5–4.9)

## 2014-11-19 LAB — CBC WITH DIFFERENTIAL/PLATELET
BASOS ABS: 0 10*3/uL (ref 0.0–0.1)
Basophil %: 0.8 %
EOS ABS: 0.1 10*3/uL (ref 0.0–0.7)
Eosinophil %: 1.9 %
HCT: 25.4 % — ABNORMAL LOW (ref 40.0–52.0)
HGB: 8 g/dL — AB (ref 13.0–18.0)
LYMPHS PCT: 22.4 %
Lymphocyte #: 0.7 10*3/uL — ABNORMAL LOW (ref 1.0–3.6)
MCH: 30 pg (ref 26.0–34.0)
MCHC: 31.5 g/dL — ABNORMAL LOW (ref 32.0–36.0)
MCV: 95 fL (ref 80–100)
Monocyte #: 0.4 x10 3/mm (ref 0.2–1.0)
Monocyte %: 12.4 %
NEUTROS PCT: 62.5 %
Neutrophil #: 2 10*3/uL (ref 1.4–6.5)
Platelet: 140 10*3/uL — ABNORMAL LOW (ref 150–440)
RBC: 2.66 10*6/uL — ABNORMAL LOW (ref 4.40–5.90)
RDW: 19.6 % — ABNORMAL HIGH (ref 11.5–14.5)
WBC: 3.2 10*3/uL — ABNORMAL LOW (ref 3.8–10.6)

## 2014-11-20 LAB — PHOSPHORUS: Phosphorus: 3.8 mg/dL (ref 2.5–4.9)

## 2014-11-21 ENCOUNTER — Encounter (HOSPITAL_COMMUNITY): Payer: Self-pay | Admitting: *Deleted

## 2014-12-01 ENCOUNTER — Telehealth: Payer: Self-pay

## 2014-12-01 NOTE — Telephone Encounter (Signed)
No information to contact patient for HIV follow up. Notes indicate he is in care in [place].   Medical records indicate mail has been returned and no phone number on file.   I will report to [facility] , [facility] Counselor to ensure he is in care.   Laurell Josephsammy K Rashod Gougeon, RN

## 2014-12-09 ENCOUNTER — Telehealth: Payer: Self-pay

## 2014-12-09 NOTE — Telephone Encounter (Signed)
I was not able to leave a message on voice mail.  Patient called last week to schedule intake and left the number : [phone] which does not have voice mail.  I have scheduled intake for him in case he calls back.   December 17, 2013 @ 10:30 am.  If this is not convenient he can reschedule for Jan. 2016  Laurell Josephsammy K Mahima Hottle, RN

## 2014-12-17 ENCOUNTER — Ambulatory Visit: Payer: Self-pay

## 2015-04-11 NOTE — Discharge Summary (Signed)
PATIENT NAME:  [name], [name] MR#:  [id number] DATE OF BIRTH:  1981-10-31  DATE OF ADMISSION:  11/18/2014 DATE OF DISCHARGE:  11/21/2014  DISCHARGE DIAGNOSES: 1. Sickle cell pain crisis.  2. Moderately-elevated hypertension.  3. End-stage renal disease on hemodialysis.  4. Intractable nausea, vomiting due to possible acute gastritis.   DISCHARGE MEDICATIONS:  1. Lisinopril 10 mg p.o. daily.  2. Hydroxyurea 500 mg p.o. b.i.d.  3. Folic acid 1 mg p.o. daily.  4. Dilaudid 8 mg every 4 hours as needed for pain.  5. benadryl 25 mg p.o. t.i.d. as needed for itching. 6. PhosLo gelcaps 677 mg p.o. daily.  7. Amlodipine 10 mg p.o. daily.  8. Hydralazine 50 mg p.o. 4 times daily. Note that hydralazine is new medication.  FOLLOWUP:  The patient [facility] for his routine dialysis needs.   CONSULTATIONS: Nephrology consult for hemodialysis.   HOSPITAL COURSE:  1. The patient is a [age] year old male with sickle cell disease, admitted because of sickle cell pain crisis. The patient had intractable nausea, vomiting at home, unable to keep p.o. at home and associated generalized body pains. Patient could not go for his previous dialysis session. The patient said that he had only half session of dialysis was done on previous dialysis schedule. He was admitted because of sickle cell pain crisis, started on IV fluids at a very slow rate because he is on dialysis. He was admitted and received IV pain medications, IV Zofran, IV PPIs. The patient's symptoms improved with IV PPI and Zofran but continued to require IV pain medications because of sickle cell pain, so he received IV Dilaudid for 3 days. The patient's infection workup has been negative. His chest x-ray was negative and  blood cultures were negative. The patient's symptoms improved with pain medications alone. The patient required IV Dilaudid 4 mg q.4h. for 3 days. The patient empirically was given Rocephin. The patient's  hemoglobin level at 8 and kidney function showed he has ESRD. The patient was seen by nephrology and he got hemodialysis on December 2nd and 3rd. The patient advised to follow up with [facility] for his routine dialysis needs.  2. Moderately-elevated hypertension. The patient's blood pressure was high in on admission at 209/100 at its highest and has fallen to 174/90. He is not sure what he takes at home, but his reading was much better after hydralazine Norvasc and lisinopril. The patient was given prescription for hydralazine and amlodipine.   PHYSICAL EXAMINATION AT THE TIME OF DISCHARGE: GENERAL:  The patient is alert, awake, oriented.  CARDIOVASCULAR:  S1, S2 heard. LUNGS: Clear to auscultation.  ABDOMEN: Soft, nontender, nondistended. Bowel sounds normal.  VITAL SIGNS:  Temperature 98.7, heart rate around 100, blood pressure is around 170/80, saturation 95% on room air.   The patient went home in stable condition.   TIME SPENT: More than 30 minutes    ____________________________ Christopher HammingSnehalatha Aniello Christopoulos, MD sk:kl D: 11/26/2014 07:35:47 ET T: 11/26/2014 12:48:26 ET JOB#: [id number]  cc: Christopher HammingSnehalatha Kamarian Sahakian, MD, <Dictator> Christopher HammingSNEHALATHA Crecencio Kwiatek MD ELECTRONICALLY SIGNED 12/02/2014 23:33

## 2015-04-11 NOTE — H&P (Signed)
PATIENT NAME:  [name], [name] DATE OF BIRTH:  04/28/1981  DATE OF ADMISSION:  11/18/2014  ADDENDUM:    PHYSICAL EXAMINATION: GENERAL: Awake, alert, oriented African American male not in acute distress.  HEAD: Atraumatic normocephalic.  EYES:  Pupils are equal, reactive to light, extraocular muscles intact.  EAR AND NOSE AND THROAT: No tympanic membrane congestion, no turbinate hypertrophy. No oropharyngeal erythema.  NECK: Supple, no JVD, no carotid bruit.   CARDIOVASCULAR: S1, S2, regular. No murmurs. PMI not discpalced LUNGS: Clear to auscultation. No wheeze, no rales,  not using accessory mucles of respirations  GASTROINTESTINAL: Abdomen soft, nontender, nondistended, bowel sounds are present. Patient does not have any hernias.  EXTREMITIES: No extremity edema. No focal deficits.  NEUROLOGIC: Cranial nerves  2 to 12 > intact, power 5/5 in upper and lower extremities, sensation intact. DTRs are 2+ bilaterally.   PSYCHIATRIC: Mood and affect are within normal limits. The patient is agreeable to stay overnight.    TIME SPENT: Was 55 minutes.    ____________________________ Katha HammingSnehalatha Alijah Hyde, MD sk:nt D: 11/18/2014 22:21:36 ET T: 11/18/2014 23:07:11 ET JOB#: [id number]  cc: Katha HammingSnehalatha Nary Sneed, MD, <Dictator> Katha HammingSNEHALATHA Dexton Zwilling MD ELECTRONICALLY SIGNED 12/13/2014 19:47

## 2015-04-11 NOTE — H&P (Signed)
PATIENT NAME:  [name], [name] MR#:  [id number] DATE OF BIRTH:  01-10-1981  DATE OF ADMISSION:  11/18/2014  PRIMARY DOCTOR:  [facility].    EMERGENCY ROOM PHYSICIAN: DR.Shaevitz  CHIEF COMPLAINT: Generalized body pains and also intractable nausea and vomiting.   HISTORY OF PRESENT ILLNESS: This is a67 yr old  -year-old male with a history of sickle cell disease,ESRD on HD hemodialysis comes in because of intractable nausea and vomiting  for 3 days,.  The patient's last dialysis was on Saturday and he had only half session of dialysis on Saturday.  Because of persistent nausea and vomiting since Saturday, unable to eat anything  and generalized body pains he came here today.  The patient missed hemodialysis yesterday.  Denies any cough, no fever, denies any trouble breathing. Denies any diarrhea. The patient complains of generalized body pains especially all over his body, in the body and also in his abdomen.  The patient denies any chest pain today.   PAST MEDICAL HISTORY: Significant for history of sickle cell flare 2 years ago. htn ESRD,follows with [facility] nephrology,HD on Tue,Thu,Sat  ALLERGIES:  He has no allergies.    SOCIAL HISTORY:  Smokes about a pack per day. No alcohol. No drugs.    SURGICAL HISTORY: History of right hip replacement and also AV fistula on left forearm.    MEDICATIONS: The patient takes hydroxyurea and folic acid  takes dialudid 8 mg po q 4hr for flare ups.   REVIEW OF SYSTEMS:   CONSTITUTIONAL: Denies any fever but complains of generalized body pains and also intractable nausea and vomiting for 3 days, unable to take p.o. medication. He says he usually takes Dilaudid 8 mg every 4 hours for pain due to sickle cell disease but is not  able to take because of significant pain and nausea and vomiting.  EARS, NOSE, AND THROAT: Denies any epistaxis, no difficulty swallowing.  No throat pain.  GASTROINTESTINAL: Has intractable nausea, vomiting for 3 days. No diarrhea. No  abdominal pain.  GENITOURINARY: No dysuria.  PULMONARY: Denies any chest pain or trouble breathing.  CARDIOVASCULAR;no palpitatations,no syncope  NEUROLOGICAL: Alert, awake, oriented. No focal neurological deficits.  SKIN;has dry skin.has seborrhiec dermatiti.  NEUROLOGICAL: no h/o strokes or TIAs.   PSYCHIATRIC:  Mood and affect are within normal limits    LABORATORY DATA:CBC is WNL CHEM7;MP. METABOLIC PANEL                               GLUCOSE                HI  156                  65-99          mg/dL             BUN                    HI  28                   7-18           mg/dL             CREATININE             HI  9.15                 0.60-1.30      mg/dL  SODIUM                     136                  136-145        mmol/L            POTASSIUM                  4.0                  3.5-5.1        mmol/L            CHLORIDE                   98                   98-107         mmol/L            CO2                        27                   21-32          mmol/L            CALCIUM, TOTAL         LO  8.0                  8.5-10.1       mg/dL             SGOT (AST)                 27                   15-37          Unit/L            SGPT (ALT)             LO  10                   14-63          U/L                                                              NOTE: New Reference Range                                                  07/08/14   ALKALINE PHOSPHATASE   HI  145                  46-116         Unit/L  NOTE: New Reference Range                                                  07/08/14   ALBUMIN                LO  2.4                  3.4-5.0        g/dL              TOTAL PROTEIN          HI  9.3                  6.4-8.2        g/dL              BILIRUBIN,TOTAL            0.5                  0.2-1.0        mg/dL             OSMOLALITY                 281                  275-301                           ANION GAP                  11                   7-16                             eGFR (AFRICAN AMERICAN LO  9                    >58m/min                        eGFR (NON-AFRICAN AMER   . X-ray of chest is clear of any infiltrates.    ASSESSMENT AND PLAN:  1.  The patient is 33yrold male with sickle cell disease comes in with sicklecell pain crisis The patient is admitted to hospitalist service.   continue IV fluids(slow rate as he is ON HD) continue  IV pain meds,o2,iv zofran 2.  End-stage renal disease, on hemodialysis;consult [facility] nephro for HD 3.  Hypertension. BP controlled. Continue home lisinopril  4.  Possible acute gastritis with nausea, vomiting. continue IV PPI,IV zofran, TIME SPENT 55 minutes.  ;  ____________________________ SEpifanio Lesches MD sk:AT D: 11/18/2014 22:19:57 ET T: 11/18/2014 22:56:19 ET JOB#: [id number] cc: SEpifanio Lesches MD, <Dictator> SEpifanio LeschesMD ELECTRONICALLY SIGNED 12/13/2014 19:46

## 2015-04-11 NOTE — H&P (Signed)
PATIENT NAME:  [name], [name] MR#:  [id number] DATE OF BIRTH:  18-Nov-1981  DATE OF ADMISSION:  11/18/2014  ADDENDUM Admisson diagnosis also includes malignant HTN; Katha HammingSNEHALATHA Nico Syme MD ELECTRONICALLY SIGNED 12/13/2014 19:48

## 2015-04-12 NOTE — Op Note (Signed)
PATIENT NAME:  [name], [name] MR#:  [id number] DATE OF BIRTH:  1981/06/17  DATE OF PROCEDURE:  03/13/2012  PREOPERATIVE DIAGNOSES:  1. End-stage renal disease requiring hemodialysis.  2. Superior vena cava syndrome.   POSTOPERATIVE DIAGNOSES: 1. End-stage renal disease requiring hemodialysis.  2. Superior vena cava syndrome.   PROCEDURES PERFORMED:  1. Introduction catheter into superior vena cava.  2. Placement of a cuffed tunneled dialysis catheter.   PROCEDURE PERFORMED BY: Renford DillsGregory G. Pattiann Solanki, MD   SEDATION: Versed 3 mg plus fentanyl 150 mcg administered IV. Continuous ECG, pulse oximetry, and cardiopulmonary monitoring was performed throughout the entire procedure by the interventional radiology nurse. Total sedation time is 45 minutes.   ACCESS: Left IJ 12.5 French sheath.   CONTRAST USED: Isovue 5 mL.   FLUORO TIME: 3.4 minutes.   INDICATIONS: Mr. Milta DeitersSlaughter is a 34 year old gentleman with multiple medical problems who has had multiple access procedures done in the left arm. He had stents placed in his subclavian system and has history of superior vena cava syndrome. He has requested the catheter be placed on his left IJ. Risks and benefits were reviewed. All questions were answered. Indications for placement on the right side were also reviewed. The patient insists on left-sided placement.   PROCEDURE: The patient is taken to Special Procedures and placed in the supine position. After adequate sedation is achieved, his left neck and chest wall are prepped and draped in a sterile fashion. Ultrasound is placed in a sterile sleeve. Ultrasound is utilized secondary to lack of appropriate landmarks and to avoid vascular injury. Under direct ultrasound visualization, the jugular vein is identified. It is echolucent, homogeneous, and easily compressible indicating patency. Image is recorded for the permanent record. Under real-time guidance after 1% lidocaine is infiltrated in the soft  tissues, a Seldinger needle is inserted into the jugular vein. J-wire is advanced. Under fluoroscopy, the J-wire continuously advances across the left innominate into the right innominate and up into the subclavian. Multiple attempts at steering the wire with a KMP catheter were made. This was not successful. Glidewire could not be introduced and so the KMP catheter was pulled from the superior vena cava into the base of the jugular vein and hand injection of contrast was utilized to demonstrate the innominate veins and the superior vena cava in the atrium. Approximately 50 to 60% stenosis was noted in the innominate vein and once this was identified with the KMP catheter and the glide wire it was negotiated across this and down into the superior vena cava and then inferior vena cava. With the KMP catheter advanced to this level, the Glidewire was removed and the J-wire was then advanced through. Small pocket was created with a hemostat at the neck insertion site. Serial dilatation was performed followed by placement of peel-away sheath. The dilator was removed and the catheter was advanced over the wire through the peel-away sheath. On fluoroscopy the tips were positioned in the atrium and the wire was removed. The peel-away sheath was then removed. The catheter with fluoroscopy was then positioned so the tips were at the atriocaval junction. An exit site was selected. 1% lidocaine was infiltrated in the soft tissues on the chest wall. Small incision was made. Tunneling device was then passed from the exit site to the insertion site. The tract was dilated slightly and then the catheter was pulled subcutaneously. Catheter was then transected and the hub assembly connected without difficulty. Both lumens aspirate and flush easily. Fluoroscopy demonstrates smooth contour  with the tips in good position and both lumens were packed with 5000 units of heparin. The neck incision was closed with a Monocryl subcuticularly  and Dermabond. Catheter was secured to the chest with 0 silk sutures and a sterile dressing was applied. The patient tolerated the procedure well and there were no complications.   ____________________________ Renford Dills, MD ggs:drc D: 03/13/2012 16:43:35 ET T: 03/13/2012 17:27:24 ET JOB#: [id number]  cc: Renford Dills, MD, <Dictator> Munsoor Lizabeth Leyden, MD Renford Dills MD ELECTRONICALLY SIGNED 03/21/2012 9:01

## 2015-04-12 NOTE — Consult Note (Signed)
Please see Dawn Harrison's notes. Chronic pancreatitis hx. Needs careful evaluation of pancreas to see if patient truly has chronic pancreatitis. CT with contrast ordered tonight. Pt to have dialysis tomorrow. Needs panc enzymes daily. Will follow. Thanks.  Electronic Signatures: Lutricia Feilh, Harvie Morua (MD)  (Signed on 27-Mar-13 16:36)  Authored  Last Updated: 27-Mar-13 16:36 by Lutricia Feilh, Jeanmarie Mccowen (MD)

## 2015-04-12 NOTE — Consult Note (Signed)
Pt has nausea before and with eating, on zofran without help.  Will try carafate slurry before meals to see if this helps.  Electronic Signatures: Scot JunElliott, Jonnie Truxillo T (MD)  (Signed on 30-Mar-13 10:10)  Authored  Last Updated: 30-Mar-13 10:10 by Scot JunElliott, Kelisha Dall T (MD)

## 2015-04-12 NOTE — Consult Note (Signed)
Chief Complaint:   Subjective/Chief Complaint Still nauseous and in pain. Even though CT was ordered with oral/iv contrast, patient did not receive contrast. CT without contrast of pancreas looked normal. Pt had dialysis this AM.   VITAL SIGNS/ANCILLARY NOTES: **Vital Signs.:   28-Mar-13 12:15   Vital Signs Type Post Medication   Pulse Pulse 85   Pulse source per Dinamap   Systolic BP Systolic BP 178   Diastolic BP (mmHg) Diastolic BP (mmHg) 84   Mean BP 115   BP Source Dinamap   Brief Assessment:   Cardiac Regular    Respiratory clear BS    Gastrointestinal diffusely tender     Routine Chem:  28-Mar-13 07:30    Phosphorus, Serum 8.4   Lipase 324  Routine Hem:  28-Mar-13 07:30    WBC (CBC) 3.2   RBC (CBC) 2.85   Hemoglobin (CBC) 9.5   Hematocrit (CBC) 28.9   Platelet Count (CBC) 159   MCV 102   MCH 33.5   MCHC 33.0   RDW 13.8   Neutrophil % 52.1   Lymphocyte % 21.7   Monocyte % 18.6   Eosinophil % 7.1   Basophil % 0.5   Neutrophil # 1.7   Lymphocyte # 0.7   Monocyte # 0.6   Eosinophil # 0.2   Basophil # 0.0  Blood Glucose:  28-Mar-13 11:37    POCT Blood Glucose 76   Radiology Results: CT:    27-Mar-13 13:08, CT Abdomen and Pelvis Without Contrast   CT Abdomen and Pelvis Without Contrast    REASON FOR EXAM:    (1) pain, pancreatitis, n/v; (2) same  COMMENTS:       PROCEDURE: CT  - CT ABDOMEN AND PELVIS W0  - Mar 14 2012  1:08PM     RESULT: Axial noncontrast CT scanning was performed through the abdomen   and pelvis with reconstructions at 3 mm intervals and slice thicknesses.   The patient has a history of sickle cell anemia and is on dialysis and is   being evaluated for possible pancreatitis.    The spleen is enlarged measuring 14.7 cm in greatest AP projection. The   liver exhibits no focal mass nor ductal dilation. There is radiodense   bile within the gallbladder. I see no discrete stones. The pancreas is   grossly normal. It is somewhat  difficult to separate it from the adjacent   loops of unopacified small bowel. I do notsee objective evidence of a     pancreatic pseudocyst. There is no evidence of ascites. No abnormal   pancreatic calcifications are demonstrated.    The kidneys are atrophic. The caliber of the abdominal aorta is normal.   The unopacified loops of small and large bowel exhibit no evidence of   ileus nor of obstruction. The partially distended urinary bladder is   grossly normal. There is density in the right aspect of the pelvis which   is slightly greater than that of the unopacified contents ofthe urinary   bladder. It has Hounsfield measurement of +49 and is nonspecific. This   lies above the urinary bladder and measures approximately 7 cm AP x 5.8   cm transversely. Its Hounsfield measurement suggests that it is soft   tissue density in nature. I do not see abnormal gas collections within   it.     There is extensive chronic deformity of the right hip. The lumbar     vertebral bodies are preserved in height. The lung  bases are clear on the   right but on the left there is patchy alveolar density posteriorly   consistent with pneumonia.    IMPRESSION:   1. I do not see objective evidence of pancreatic inflammation.  2. I see no discrete stones within the gallbladder but the bile is   radiodense and layering.  3. There is abnormal soft tissue density in the right aspect of the   pelvis. It is difficult to characterize due to lack of separation from   adjacent structures in the lack of contrast material. Oral contrast was   not administered today due to the patient's inability to tolerate it. IV   contrast was not administered due to some risk of precipitating a sickle   cell crisis. If the patient could be given oral contrast this area in the   pelvis could be better evaluated. The differential diagnosis is broad     given the nonspecific appearance. Inflammatory as well as neoplastic    processes could be present.  4. There is an infiltrate present in the left lower lobe posteriorly   consistent with pneumonia.          Verified By: DAVID A. [place], M.D., MD   Assessment/Plan:  Assessment/Plan:   Assessment Hx of chronic pancreatitis, although no confirmation of this. Sickle cell crisis can also cause abd pain, nausea, and vomiting.    Plan Would try to repeat CT at least with oral contrast if nausea better. Continue IVF and NPO for today. I will be out for a week. Will have DR. Elliott cover for me in my absense. Thanks.   Electronic Signatures: Lutricia Feil (MD)  (Signed 28-Mar-13 12:35)  Authored: Chief Complaint, VITAL SIGNS/ANCILLARY NOTES, Brief Assessment, Lab Results, Radiology Results, Assessment/Plan   Last Updated: 28-Mar-13 12:35 by Lutricia Feil (MD)

## 2015-04-12 NOTE — Consult Note (Signed)
Brief Consult Note: Diagnosis: Abdominal pain with associated nausea and vomiting. History of chronic pancreatitis.  Admitted with acute episode of acute pancreatitis.  Rising Lipase. Type I Diabetes Mellitus receiving hemodylasis on Tuesday, Thursday, and Saturday regimen.   Consult note dictated.   Recommend further assessment or treatment.   Discussed with Attending MD.   Comments: Patient's presentation was discussed with Dr. Lutricia FeilPaul Oh.  Recommendation is to proceed with CT scan of abdomen and pelvis with contrast as already ordered STAT.  Concern with noted history and rising lipase level.  Patient continues to experience pain as an 8 on scale of one to ten.  NPO status.  Will continue to monitor laboratory studies.  When patient is discharged home recommend patient being discharged with Creon 72,000 units of lipase with meals and 48,000 units with snacks.  Electronic Signatures: Rodman KeyHarrison, Ichael Pullara S (NP)  (Signed 27-Mar-13 12:52)  Authored: Brief Consult Note   Last Updated: 27-Mar-13 12:52 by Rodman KeyHarrison, Thelmer Legler S (NP)

## 2015-04-12 NOTE — Consult Note (Signed)
[name] NAME:  Christopher [name], Christopher [name] MR#:  [id number] DATE OF BIRTH:  06-Jul-1981  DATE OF CONSULTATION:  03/14/2012  REFERRING PHYSICIAN:  Katharina Caperima Vaickute, MD   CONSULTING PHYSICIAN:  Christopher FeilPaul Oh, MD/Christopher Bluestein Mort SawyersS. Lalitha Ilyas, NP PRIMARY Frank PHYSICIAN: Dr. Melina [name], Christopher [name]  REASON FOR CONSULTATION: Abdominal pain, unable to eat, known history of chronic pancreatitis.   HISTORY OF PRESENT ILLNESS: Christopher [name] is a 34 year old African American gentleman with a significant past medical history of HIV diagnosed in 2003, sickle cell disease, type 1 diabetes mellitus, hypertension, and chronic pancreatitis. He was diagnosed with sickle cell disease as well as diabetes mellitus, type I, at Christopher age of 34. He was admitted for Christopher concern of abdominal pain, nausea, vomiting, elevation in lipase level indicative of acute pancreatitis, on top of a known history of chronic pancreatitis. Lipase level was 422.   Christopher [name] states that Saturday evening he was doing fine. Sunday he went to lay down, had onset of abdominal pain, epigastric, as well as across Christopher entire upper abdomen radiating through to his back. Then his entire stomach started to hurt, he became nauseated and vomited. This occurred two to three hours after having eaten a meal. He denies emesis being inclusive of undigested food. It was mostly bile-like emesis. He progressed to dry heaving, streaks of blood noted. He states that this is typical for episodes of acute pancreatitis. He had one additional episode this year and a total of 5 to 6 last year. In questioning Christopher [name], he has been told that his current medications for treatment of HIV are Christopher underlying etiological cause of Christopher recurrent episodes of acute pancreatitis in Christopher setting of a known history of chronic pancreatitis. He had an abdominal ultrasound done on March 21st which revealed right kidney somewhat small but no evidence of cholelithiasis. Christopher [name] states that does not feel much better  than when he came in Christopher hospital; in fact, lipase level has continued to rise. Today's level is 731. He had diarrhea initially with Christopher onset of his symptoms. He has had one bowel movement today, and his normal bowel pattern is once a day. No jaundice, no cyanosis. Currently he rates his pain as an 8 on a scale of 1 to 10. He vomited last night as well as this morning. He has only been given ice chips. He states Christopher normal course of time for these episodes to resolve is usually 3 to 4 days. He has never been placed on pancreatic enzyme therapy. He does not believe that he has had a recent CT scan of abdomen and pelvis done during his hospitalizations. He has been on hemodialysis since 2009. He has a left upper extremity AV shunt in place. He receives hemodialysis on a Tuesday, Thursday, Saturday schedule. He was found to be hypokalemic on admission.   PAST MEDICAL HISTORY:  1. HIV diagnosed in 2003.  2. Sickle cell disease. 3. Diabetes mellitus, type 1.  4. Hypertension.  5. Chronic pancreatitis.   PAST SURGICAL HISTORY:  1. AV shunt placement to left upper extremity for hemodialysis.  2. Right hip surgery for degenerative disease.   MEDICATIONS:  1. Norvasc 5 mg a day. 2. Lantus 8 mg subcutaneous at bedtime.  3. Lisinopril 5 mg daily.  4. Multivitamin once a day.  5. NovoLog sliding scale.  6. Zofran 4 mg p.o. daily as needed.  7. Prezista 800 mg daily. 8. Norvir, unknown dose. 9. Dilaudid 4 mg every 4 to 6 hours as  needed.   FAMILY HISTORY: Hypertension in maternal grandfather. Diabetes on paternal side of Christopher family. Mother deceased at Christopher age of 69 with cervical cancer. Grandmother with history of pancreatic cancer. No colon cancer.   SOCIAL HISTORY: Single. Resides alone. No children. He smokes a few cigarettes a day. A pack of cigarettes will last him a week. No history of heavy alcohol use or recreational drug use.   REVIEW OF SYSTEMS: CONSTITUTIONAL: Significant for abdominal  pain as well as pain to upper extremities, to his back. Approximately 20-pound weight loss over Christopher past 3 to 4 months. MUSCULOSKELETAL: Leg and joint pain. GASTROINTESTINAL: Nausea and vomiting. EYES: No blurred vision. No double vision. No glaucoma. No cataracts. ENT: No tinnitus, allergies, epistaxis, sinus pain, difficulty swallowing. RESPIRATORY: No coughing, no wheezing. No asthma, chronic obstructive pulmonary disease. CARDIOVASCULAR: No chest pain. No orthopnea. No edema, arrhythmias or heart palpitations. GI: See history of present illness. GU: No dysuria, hematuria, frequency or incontinence. ENDOCRINE: No polydipsia, thyroid problems, heat or cold intolerance, or thirst. HEMATOLOGIC: Denies significant easy bruising and bleeding. INTEGUMENTARY: Significant for dry skin and lesions. NEUROLOGICAL: Denies numbness, epilepsy or tremor. PSYCHIATRIC: Denies anxiety or depression.   PHYSICAL EXAMINATION:  VITAL SIGNS: Temperature is 97.9, pulse is 91, respirations are 18, blood pressure is 163/93, pulse oximetry is 95% on room air.   GENERAL: A well-developed, well-nourished 34 year old African American gentleman who appears mildly ill sitting in a recliner. Significant for generalized pruritus, scratching throughout Christopher entire interviewing process.   HEENT: Normocephalic, atraumatic. Pupils are equal, reactive to light. Conjunctivae are clear. Sclerae are anicteric.   NECK: Supple. Trachea midline. No lymphadenopathy or thyromegaly.   PULMONARY: Symmetric rise and fall of chest. Clear to auscultation throughout.   ABDOMEN: Soft. Generalized discomfort throughout Christopher entire abdomen, more localized to upper abdomen. No rebound tenderness or guarding. No hepatosplenomegaly or masses.   RECTAL: Deferred.   MUSCULOSKELETAL: Moving all four extremities. No contractures. No clubbing.   SKIN: Significant for generalized rash, more probable correlation with chronic pruritus. Skin is warm and dry.    NEUROLOGICAL: No gross neurological deficits, alert.   PSYCHIATRIC: Alert and oriented x4. Memory is grossly intact. Appropriate affect and mood.   LABORATORY, DIAGNOSTIC AND RADIOLOGICAL DATA:  Glucose was 108 on admission, BUN was 110, creatinine as 16.51, sodium was 133, potassium was 7.1 with a chloride of 97, pCO2 was 18, and anion gap elevated at 18. Calcium 8.6. Phosphorus was 9.2. Lipase was 422.  Hemoglobin A1c of 5.2.  Hepatic panel: Total protein was 9.4, otherwise within normal limits.  Troponin was less than 0.02.  CBC on March 25th: WBC is 3.5, RBC 3.57, hemoglobin 12.2, with hematocrit of 36.2. MCV elevated at 101 with an MCH of 34.1.  Reticulocyte count was 0.7, absolute reticulocyte count was 0.026.  Vitamin B12 level was 578.  According to Christopher [name], most recent CD4 count was 569.  Today's date, in comparison, on chemistry panel BUN is 62, creatinine is 11.88, sodium is 133, potassium is 6.3, chloride is 93.0. Christopher [name] did receive dialysis yesterday. Lipase is 731. RBC 3.01, hemoglobin 10.1, hematocrit 30.5. MCV is elevated at 0.01.  EKG was normal sinus rhythm.  Chest, single view on Christopher 25th, revealed that there is patchy thickening of Christopher left basilar lung markings consistent with atelectasis or pneumonia. Christopher heart appears mildly enlarged, and incidentally noted is a subclavian vascular stent on Christopher left side.    IMPRESSION:  1. Admission for abdominal pain,  nausea and vomiting, correlation with acute pancreatitis on top of Christopher setting of chronic pancreatitis. Unfortunately, lipase continues to rise.  2. Known history of diabetes mellitus, type I, as well as sickle cell disease.  3. Hemodialysis on Tuesday, Thursday, Saturday regimen.   PLAN: Christopher [name]'s presentation was discussed with Dr. Lutricia Feil. Recommendation is to proceed with CT scan of Christopher abdomen and pelvis with contrast, already ordered stat by Hospitalist Service. Concern is noted with history of  chronic pancreatitis and rising lipase level. Christopher [name] continues to experience pain as an 8 on a scale of 1 to 10. N.p.o. status. We will continue to monitor laboratory studies. I do recommend when Christopher [name] is discharged home that he be discharged home with Creon 72,000 units of lipase with meals and 48,000 units with snacks.   These services are provided by Rodman Key, MS, APRN, [facility], FNP,  under collaborative agreement with Christopher Feil, MD.    Thank you for allowing [place] to participate in [name] of [facility] during hospitalization.  ____________________________ Rodman Key, NP dsh:cbb D: 03/14/2012 13:08:38 ET T: 03/14/2012 13:43:10 ET JOB#: [id number]  cc: Rodman Key, NP, <Dictator> Rodman Key MD ELECTRONICALLY SIGNED 03/14/2012 16:33

## 2015-04-12 NOTE — Consult Note (Signed)
General Aspect clotted left arm AV access in a patient who needs dialysis    Present Illness The patient is a 34 year old male with past medical history significant for history of HIV, sickle cell disease, history of diabetes and hypertension, as well as chronic pancreatitis, who presented to the hospital with complaints of nausea and vomiting as well as abdominal pain. According to the patient, he was doing well up until approximately a few days ago when he started having worsening abdominal pain in the upper abdomen as well as nausea and vomiting. He is not able to take p.o. He denies any fevers or chills, but feels that his discomfort is coming from sickle cell crisis. On arrival to the emergency room, he was noted to be dehydrated. He was also noted to be hypokalemic and his lipase level was found to be elevated at 422. He typically receives dialysis via the left arm loop graft but has a history of multiple revisions and interventions.  Yesterday he was noted to have no thrill and no bruit over the graft.  He is increasingly acidotic and will now require dialysis.  I am asked to evaluate.  PAST MEDICAL HISTORY:  1. HIV. 2. Sickle cell disease. 3. Diabetes mellitus. 4. Hypertension.  5. Chronic pancreatitis; last exacerbation was approximately one year ago.   Home Medications: Medication Instructions Status  ondansetron 4 mg oral tablet, disintegrating 1 tab(s) orally every 4 hours x 2 days, As Needed- for Nausea, Vomiting  Active  Lantus 100 units/mL subcutaneous solution 8 unit(s) subcutaneous once a day (at bedtime) Active  lisinopril 5 mg oral tablet 1 tab(s) orally once a day Active  amlodipine 5 mg oral tablet 1 tab(s) orally once a day Active  NovoLog 100 units/mL subcutaneous solution  sliding scale subcutaneous with meals and at bedtime. Active  multivitamin 1 tab(s) orally once a day Active  Prezista 400 mg oral tablet 2 tab(s) orally once a day Active    No Known Allergies:    Case History:   Family History Non-Contributory    Social History positive  tobacco, negative ETOH, negative Illicit drugs   Review of Systems:   Fever/Chills No    Cough No    Sputum No    Abdominal Pain Yes    Diarrhea No    Constipation No    Nausea/Vomiting Yes    SOB/DOE Yes    Chest Pain No    Telemetry Reviewed NSR    Dysuria No    Tolerating PT No    Tolerating Diet No   Physical Exam:   GEN WD, thin, ill appearing    HEENT PERRL, dry oral mucosa, poor dentition    NECK thyroid tender  trachea midline    RESP normal resp effort  no use of accessory muscles    CARD regular rate  No LE edema  positive JVD    VASCULAR ACCESS AV fistula present  No bruit, no thrill    ABD positive tenderness  soft  hypoactive BS    EXTR negative cyanosis/clubbing, negative edema    SKIN positive rashes, skin turgor poor    NEURO cranial nerves intact, follows commands    PSYCH alert, poor insight   Nursing/Ancillary Notes: **Vital Signs.:   26-Mar-13 11:36   Pulse Pulse 99   Respirations Respirations 12   Systolic BP Systolic BP 233   Diastolic BP (mmHg) Diastolic BP (mmHg) 73   Pulse Ox % Pulse Ox % 100   Telemetry  pattern Cardiac Rhythm Normal sinus rhythm   Blood Glucose:  26-Mar-13 00:03    POCT Blood Glucose 91  Routine Chem:  26-Mar-13 01:43    Potassium, Serum 6.8   Phosphorus, Serum 9.2  Blood Glucose:  26-Mar-13 03:38    POCT Blood Glucose 69  Routine Hem:  26-Mar-13 04:34    WBC (CBC) -   RBC (CBC) -   Hemoglobin (CBC) -   Hematocrit (CBC) -   Platelet Count (CBC) -   MCV -   MCH -   MCHC -   RDW -  Routine Chem:  26-Mar-13 04:34    Glucose, Serum 125   BUN 115   Creatinine (comp) 16.60   Sodium, Serum 137   Potassium, Serum 6.7   Chloride, Serum 101   CO2, Serum 15   Calcium (Total), Serum 8.1  Hepatic:  26-Mar-13 04:34    Bilirubin, Total 0.6   Alkaline Phosphatase 102   SGPT (ALT) 13   SGOT (AST) 13   Total  Protein, Serum 8.4   Albumin, Serum 3.2  Routine Chem:  26-Mar-13 04:34    Osmolality (calc) 312   eGFR (African American) 4   eGFR (Non-African American) 4   Anion Gap 21   Lipase 455  Cardiac:  26-Mar-13 04:34    Troponin I < 0.02  Routine Hem:  26-Mar-13 04:34    Neutrophil % -   Lymphocyte % -   Monocyte % -   Eosinophil % -   Basophil % -   Neutrophil # -   Lymphocyte # -   Monocyte # -   Eosinophil # -   Basophil # -   Bands -   Segmented Neutrophils -   Lymphocytes -   Variant Lymphocytes -   Monocytes -   Eosinophil -   Basophil -   Metamyelocyte -   Myelocyte -   Promyelocyte -   Blast-Like -   Other Cells -   NRBC -   Diff Comment 1 -   Diff Comment 2 -   Diff Comment 3 -   Diff Comment 4 -   Diff Comment 5 -   Diff Comment 6 -   Diff Comment 7 -   Diff Comment 8 -   Diff Comment 9 -   Diff Comment 10 -  Routine Chem:  26-Mar-13 04:34    Cholesterol, Serum 130   Triglycerides, Serum 48   HDL (INHOUSE) 36   VLDL Cholesterol Calculated 10   LDL Cholesterol Calculated 84  Blood Glucose:  26-Mar-13 07:29    POCT Blood Glucose 88  Routine Hem:  26-Mar-13 14:00    WBC (CBC) 3.7   RBC (CBC) 2.91   Hemoglobin (CBC) 9.9   Hematocrit (CBC) 29.7   Platelet Count (CBC) 169   MCV 102   MCH 34.2   MCHC 33.4   RDW 14.7  Cardiac:  26-Mar-13 14:00    Troponin I < 0.02  Routine Chem:  26-Mar-13 14:00    Phosphorus, Serum 9.0  Routine Hem:  26-Mar-13 14:00    Neutrophil % 62.4   Lymphocyte % 19.7   Monocyte % 12.0   Eosinophil % 5.0   Basophil % 0.9   Neutrophil # 2.3   Lymphocyte # 0.7   Monocyte # 0.4   Eosinophil # 0.2   Basophil # 0.0  Blood Glucose:  26-Mar-13 16:56    POCT Blood Glucose 74     Impression 1.  complication of  dialysis access          will place a permacath today as I do not think the av graft is salvagable. 2.  end stage renal disease           nephrology managing 3.  acidosis            willl correct with  dialysis 4.  hypertension             continue home meds 5.  HIV             antiviral medications per [facility]    Plan level 4   Electronic Signatures: Hortencia Pilar (MD)  (Signed 26-Mar-13 21:06)  Authored: General Aspect/Present Illness, Home Medications, Allergies, History and Physical Exam, Vital Signs, Labs, Impression/Plan   Last Updated: 26-Mar-13 21:06 by Hortencia Pilar (MD)

## 2015-04-12 NOTE — H&P (Signed)
PATIENT NAME:  [name], [name] MR#:  [id number] DATE OF BIRTH:  1981/07/28  DATE OF ADMISSION:  03/12/2012  PRIMARY CARE PHYSICIAN: Dr. Morrie Sheldon - [facility]  HISTORY OF PRESENT ILLNESS: The patient is a 34 year old African American male with past medical history significant for history of HIV, sickle cell disease, history of diabetes and hypertension, as well as chronic pancreatitis, who presented to the hospital with complaints of nausea and vomiting as well as abdominal pain. According to the patient, he was doing well up until approximately a few days ago when he started having worsening abdominal pain in the upper abdomen as well as nausea and vomiting. He is not able to take p.o. He denies any fevers or chills, but feels that his discomfort is coming from sickle cell crisis. On arrival to the emergency room, he was noted to be dehydrated. He was also noted to be hypokalemic and his lipase level was found to be elevated at 422, and the hospitalist was contacted for admission.   PAST MEDICAL HISTORY:  1. HIV. 2. Sickle cell disease. 3. Diabetes mellitus. 4. Hypertension.  5. Chronic pancreatitis; last exacerbation was approximately one year ago.   MEDICATIONS: 1. Norvasc 5 mg p.o. daily. 2. Lantus 8 units subcutaneously at bedtime. 3. Lisinopril 5 mg p.o. daily. 4. Multivitamins once daily. 5. NovoLog sliding scale.  6. Zofran 4 mg p.o. daily as needed. 7. Prezista 800 mg p.o. daily. 8. Norvir, unknown dose, daily. 9. Dilaudid 4 mg p.o. every 4 to 6 hours as needed.   PAST SURGICAL HISTORY:  1. Left upper extremity AV shunt for hemodialysis access. 2. Right hip surgery for degenerative disease.  ALLERGIES: No known drug allergies.   FAMILY HISTORY: Hypertension in the patient's maternal grandfather. The patient's paternal side has history of diabetes mellitus. The patient's mother died at age of 95 of cervical cancer. Also maternal grandmother had pancreatic cancer.    SOCIAL HISTORY: The patient is single, lives alone, has no children, and smokes a few cigarettes; one pack would last him a week. He has been smoking for approximately 10 years now. He does not drink any alcohol whatsoever. He works as a Paediatric nurse.   REVIEW OF SYSTEMS: Positive for pains in his abdomen, but also pains in his upper extremities and his back. Also weight loss, approximately 20 pounds in the past 3 to 4 months. Upper abdominal and back as well as leg joint pain. Nausea and vomiting as well as episode of diarrhea yesterday; it was watery diarrhea two to three times a day, yesterday as well as today, and upper abdominal pain radiating around to the back. No urine output whatsoever. Otherwise, he denies fevers, chills, fatigue, weakness, or weight gain. EYES: No blurry vision, double vision, glaucoma, or cataracts. ENT: Denies any tinnitus, allergies, epistaxis, sinus pain, or difficulty swallowing. RESPIRATORY: Denies any cough, wheeze, asthma, or chronic obstructive pulmonary disease. CARDIOVASCULAR: Denies chest pain, orthopnea, edema, arrhythmias, palpitations, or syncope. GASTROINTESTINAL: Denies any hematemesis, rectal bleeding, or change in bowel habits. GENITOURINARY: Denies any dysuria, hematuria, frequency, or incontinence. ENDOCRINOLOGY: Denies any polydipsia, thyroid problems, or heat or cold intolerance, or thirst. HEMATOLOGIC: Denies any anemia, easy bruising, bleeding or swelling. NEUROLOGIC: Denies numbness, epilepsy, or tremor. PSYCHIATRIC: Denies anxiety, insomnia, or depression.   PHYSICAL EXAMINATION:   VITAL SIGNS: On arrival to the hospital, his temperature was 97.9, pulse 72, respiratory rate 20, blood pressure 106/93, and saturation 100% on room air.   GENERAL: Well-nourished, African American male in  no significant distress lying on the stretcher.  EYES: Pupils are equally round and reactive to light. Extraocular movements are intact. No icterus or conjunctivitis. Normal  hearing. No pharyngeal erythema. Mucosa is moist.   NECK: No masses. Supple and nontender. Thyroid is not enlarged. No adenopathy. No JVD or carotid bruits bilaterally. Full range of motion.   LUNGS: Clear to auscultation but diminished breath sounds bilaterally. No rales, rhonchi, or wheezing. No increased effort, dullness to percussion, or overt respiratory distress.   CARDIOVASCULAR: S1 and S2 appreciated. No murmurs, gallops, or rubs noted. Tachycardic. PMI not lateralized. Chest is nontender to palpation.   EXTREMITIES: 1+ pedal pulses. Trace to 1+ lower extremity edema bilateral around his calf. No calf tenderness or cyanosis was noted.  LEFT ARM: The patient's hemodialysis access is in the left upper extremity, left upper arm, AV shunt. He apparently had one more shunt which was in the lower left arm and that is occluded.   ABDOMEN: Soft, however tender in the epigastric area. No rebound or guarding was noted. No hepatosplenomegaly or masses were noted.   RECTAL: Deferred.   MUSCULOSKELETAL: Able to move all extremities. No cyanosis, degenerative joint disease, or kyphosis. Gait was not tested.   SKIN: No rashes, lesions, erythema, nodularity, or induration. It was warm and dry to palpation.   LYMPH: No adenopathy in the cervical region.   NEUROLOGICAL: Cranial nerves grossly intact. Sensory is intact. No dysarthria or aphasia.   PSYCHIATRIC: Alert and oriented to time, person, and place, cooperative. Memory is good. No significant confusion, agitation, or depression noted.   LABS/STUDIES: BMP showed glucose 108, BUN and creatinine 110 and 16.51, sodium 133, potassium 7.1, bicarbonate 18. Estimated GFR for African American would be 4. Anion gap high at 18. LDH is 210 and lipase level 422. Liver enzymes: Total protein was elevated at 9.4, otherwise liver enzymes were normal. White blood cell count is slightly low at 3.5, hemoglobin 12.2, and platelet count 185 with  MCV high at 101.  Reticulocyte count is pending.  EKG: Apparently not done.  ASSESSMENT AND PLAN:  1. Acute pancreatitis: Admit the patient to the medical floor. We will continue him on low rate IV fluids as well as pain medications. We will get ultrasound of his abdomen done to rule out gallbladder involvement. If the patient has chronic pancreatitis, probably would benefit from cholecystectomy in the long run.  2. Nausea and vomiting: Symptomatic therapy. NPO for now.  3. Dehydration: We will continue low rate IV fluids. Care to be taken not overload the patient.  4. Hyponatremia, likely due to dehydration: We will continue on low rate IV fluids. The patient will be getting hemodialysis tonight.  5. Hyperkalemia: Hemodialysis - that was discussed with Dr. Lamont Dowdy, nephrologist.  6. End-stage renal disease: The patient will be getting hemodialysis tonight and then later on according to schedule.  7. Malignant hypertension: Resume  outpatient medications if the patient is able to take p.o. Give IV Vasotec now. We will continue the pain medications. Pain probably drives the patient's blood pressure readings very high. We will also continue the patient on IV labetalol as needed.  8. Sickle cell disease: We will get a reticulocyte count as well as B12 and folic acid levels.  9. Diabetes mellitus: We will continue Lantus as well as sliding scale insulin.  10. Leukopenia, very likely related to HIV: Follow in the morning.  11. HIV: We will be holding the patient's Prezista as well as Norvir. Norvir's  dose is not even known. Those two medications are known to cause pancreatitis.   TIME SPENT: One hour.  ____________________________ Katharina Caperima Jaystin Mcgarvey, MD rv:slb D: 03/12/2012 19:39:18 ET T: 03/13/2012 08:27:37 ET JOB#: [id number]  cc: Katharina Caperima Jasiel Belisle, MD, <Dictator> Dr. Morrie Sheldonay - [facility] Crescencio Jozwiak MD ELECTRONICALLY SIGNED 03/21/2012 14:25

## 2015-04-12 NOTE — Discharge Summary (Signed)
PATIENT NAME:  [name], [name] MR#:  [id number] DATE OF BIRTH:  1981/05/20  DATE OF ADMISSION:  05/04/2012 DATE OF DISCHARGE:  05/09/2012  PRIMARY CARE PHYSICIAN:  [facility]  PRESENTING COMPLAINT: Abdominal pain, chest pain.   DISCHARGE DIAGNOSES: 1. Pneumonia/community-acquired.  2. HIV, on HAART medication.  3. Endstage renal disease on hemodialysis.  4. Hyperkalemia, resolved.  5. Hypertension.  6. Type 2 diabetes.  7. Cardiomyopathy, ejection fraction of 15%.  8. Sickle cell disease.   CONDITION ON DISCHARGE: Fair.  Sats 98% on room air.   MEDICATIONS:  1. Lantus 8 units at bedtime.  2. NovoLog sliding scale with before meals and at bedtime.  3. Prezista 400 mg 2 tablets daily.  4. Nephro-Vite 1 tablet daily.  5. Levaquin 250 p.o. daily for eight more days.  6. Percocet 5/325, 1 p.o. q. 4 p.r.n. #20.  7. PhosLo Gelcap 667 mg, 2 capsules t.i.d.  8. Lisinopril 20 mg daily.  9. Amlodipine 10 mg daily.   DIET: Low sodium, renal diet.  FOLLOWUP:  Follow up with [facility] ID at next appointment.   LABS ON DISCHARGE: White count is 2.7, hemoglobin and hematocrit 8.9 and 27.1, platelet count 134, glucose 78, sodium 130, potassium is 4.8.   CT of the chest without contrast shows alveolar density anteriorly in the left lower lobe and posteriorly inferiorly in the left lower lobe consistent with pneumonia. There is mediastinal hilar lymphadenopathy poorly demonstrated due to lack of IV contrast. Minimal patchy interstitial density is noted in the right lung. There is an 8-mm diameter nodule on image 65 laterally in the right lower lobe.   Phosphorus 9. Cholesterol panel within normal limits with HDL of 30. Hemoglobin A1c is less than 3.5. Helper T lymphocyte CD4 count: Absolute CD4 is 235. Folic acid within normal limits. Vitamin B12 within normal limits. Blood cultures negative in 36 hours. Troponin is 0.03.    EKG: Normal sinus rhythm. T wave in lateral leads.   CONSULTATIONS: Dr. Thedore Mins,  nephrology.   BRIEF SUMMARY OF HOSPITAL COURSE: Mr. Burruss is a 34 year old African American gentleman with multiple medical problems who came to the ER and was admitted with:  1. Left-sided pneumonia as noted on chest x-ray and CT. The patient was started on IV Zosyn and Levaquin. Blood cultures remained negative. The patient's antibiotics were changed to p.o. Levaquin. The patient was complaining of some chest pain and back pain, which is likely due to pneumonia. I doubt there was sickle cell crisis because the patient's vitals remained stable and he had very nonspecific pain all over the body. The patient will complete a two-week course of antibiotic treatment with oral antibiotics. He will follow up with chest x-ray with his internal medicine physician as outpatient.  2. Hyperkalemia in the setting of endstage renal disease. The patient underwent emergent hemodialysis. Repeat potassium today is 4.8, is resolved.  3. History of sickle cell disease along with mild to moderate pain crisis in the setting of pneumonia. The patient's pain was very inconsistent and atypical. Given his diagnosis of sickle cell, IV narcotics were given to him which were changed to p.o. prior to discharge. Sats were 98% on room air. Hemoglobin and hematocrit were stable.  4. Endstage renal disease. Inpatient dialysis continued.  5. HIV. The patient is on darunavir, will be followed by [facility] ID next week. His CD4 T4 helper count was 235. He will follow up with infectious [facility] at [facility].  6. Leukopenia, suspect immunosuppression due to HIV. CBC  will be followed as an outpatient.  7. Type 2 diabetes. The patient is back on low dose Lantus.  8. Hyperlipidemia.  9. Hypertension. Resumed ACE inhibitor. Norvasc was continued.   Hospital stay otherwise remained stable. The patient is discharged to home. He remained a FULL CODE.   TIME SPENT: 40 minutes.    ____________________________ Wylie HailSona A. Allena KatzPatel, MD sap:bjt D: 05/09/2012  14:52:02 ET T: 05/10/2012 12:30:11 ET JOB#: [id number]  cc: Lanyla Costello A. Allena KatzPatel, MD, <Dictator> [facility] Willow OraSONA A Ophelia Sipe MD ELECTRONICALLY SIGNED 05/18/2012 22:46

## 2015-04-12 NOTE — Discharge Summary (Signed)
PATIENT NAME:  [name], [name] MR#:  [id number] DATE OF BIRTH:  03-19-1981  DATE OF ADMISSION:  03/12/2012 DATE OF DISCHARGE:  03/17/2012   ADMITTING DIAGNOSIS: Abdominal pain.   DISCHARGE DIAGNOSES:  1. Abdominal pain felt to be due to multifactorial reasons including acute on chronic pancreatitis as well as constipation.  2. Sickle cell disease with possible pain crisis, now improved.  3. Hyponatremia due to dehydration, now resolved.  4. Hyperkalemia due to end-stage renal disease, on hemodialysis.  5. Malignant hypertension. Meds were adjusted.  6. Diabetes.  7. HIV.  8. Nicotine abuse.  9. Anemia of chronic disease.  10. Secondary hyperparathyroidism.  11. Possible bacterial pneumonia, on p.o. Levaquin.   CONSULTANTS:  1. Dr. Wynelle Link  2. Dr. Mechele Collin  3. Vascular Surgery for access for hemodialysis.   PERTINENT LABS AND EVALUATIONS: Admitting WBC count 3.5, hemoglobin 12.2, platelet count 185. Most recent WBC count 3.9, hemoglobin 9.3, platelet count 179. BMP glucose 108, BUN 110, creatinine 16.51, sodium 133, potassium 7.1, chloride 97, CO2 18. Most recent potassium today is still elevated at 5.6. Lipase was 422 on admission, got as high as 731. CT of the abdomen and pelvis without contrast showed no pancreatic inflammation, no discrete gallstones, abnormal soft tissue density in the right aspect of the pelvis. There is an infiltrate in the left lower lobe. Repeat CT scan on the 29th showed bibasilar infiltrate most consistent with pneumonia, cardiomegaly, diffuse anasarca, large amount of stool in the colon, pancreas is unremarkable.   HOSPITAL COURSE: Please see history and physical done by the admitting physician. The patient is a 34 year old African American male with history of HIV, sickle cell disease, history of diabetes and hypertension as well as chronic pancreatitis who presented to the hospital with complaint of nausea and vomiting as well as abdominal pain. The patient  has had history of recurrent pancreatitis. According to him, last exacerbation was one year ago. Due to these symptoms, he came to the ED. He was also noted to have hyperkalemia in the ED. The patient does have end-stage renal disease and his IV access was clotted. For all of his problems he was admitted in the hospital.  1. Acute on chronic pancreatitis. The patient was kept n.p.o. with pain control. His lipase has normalized. He still has some abdominal pain but he was able to tolerate a solid diet. There was no evidence of gallstone or gallbladder disease to explain his pancreatitis.  2. End-stage renal disease with hyperkalemia. The patient required introducing of a catheter into the super vena cava and had a cuffed tunneled dialysis catheter placed without any difficulties. The patient was dialyzed since then and has tolerated this well.  3. The patient also has sickle cell disease. His hemoglobin has remained stable.  4. He likely also has chronic pain. Currently he is tolerating his diet and has been cleared by Nephrology for discharge.   DISCHARGE MEDICATIONS:  1. Zofran 4 mg q.4 p.r.n.  2. Lantus 8 units at bedtime.  3. NovoLog sliding scale with meals and bedtime. 4. Multivitamin 1 tab p.o. daily.  5. Prezista 400 mg 2 tabs daily.  6. Nicotine oral inhaler INH one hour as needed.  7. Nicotine patch 7 mg topically daily.  8. Norvasc 10 daily.  9. PhosLo two caps p.o. 3 times per day. 10. Lisinopril 20 daily.  11. Protonix 40 daily.  12. Colace 100 mg p.o. b.i.d.   13. Senna 1 tab p.o. daily as needed.  14. Levaquin  500 p.o. daily.   DIET: Low sodium, renal diet, easy to digest.   ACTIVITY: As tolerated.   TIMEFRAME FOR FOLLOW-UP:  1. Follow-up in 1 to 2 weeks with primary MD.  2. Follow-up with dialysis as previously.   TIME SPENT: 35 minutes.    ____________________________ Lacie ScottsShreyang H. Allena KatzPatel, MD shp:drc D: 03/17/2012 15:05:08 ET T: 03/19/2012 12:20:07  ET JOB#: [id number]  cc: Israel Werts H. Allena KatzPatel, MD, <Dictator> Charise CarwinSHREYANG H Lona Six MD ELECTRONICALLY SIGNED 03/19/2012 13:23

## 2015-04-12 NOTE — Consult Note (Signed)
Pt with chronic pancreatitis, sickle cell, hemodialysis, HIV he is feeling better a little from admission.  He has nausea with pain pills unless can keep food down, which is difficult lately.  No new suggestions.   Electronic Signatures: Scot JunElliott, Robert T (MD)  (Signed on 29-Mar-13 11:44)  Authored  Last Updated: 29-Mar-13 11:44 by Scot JunElliott, Robert T (MD)

## 2015-04-12 NOTE — H&P (Signed)
PATIENT NAME:  [name], [name] MR#:  [id number] DATE OF BIRTH:  [birth year]  DATE OF ADMISSION:  05/04/2012  REFERRING PHYSICIAN: Dr. Margarita Grizzle.   FAMILY PHYSICIAN: Nonlocal.   REASON FOR ADMISSION: Abdominal and chest pain associated with pneumonia and hyperkalemia.   HISTORY OF PRESENT ILLNESS: The patient is a 34 year old male with a significant history of sickle cell disease, chronic pancreatitis, HIV, as well as endstage renal disease on hemodialysis who presents to the emergency room today with nausea, vomiting, and diarrhea. Also has been having some chest and abdominal pain. In the emergency room, the patient was noted to be hyperkalemic with pneumonia on chest x-ray. He was given Dilaudid with persistent pain and is now admitted for further evaluation.   PAST MEDICAL HISTORY:  1. Chronic pancreatitis.  2. Sickle cell disease.  3. HIV positivity.  4. End-stage renal disease, on hemodialysis.   5. Type 2 diabetes mellitus.  6. Benign hypertension.  7. Status post right hip surgery.  8. Anemia of chronic disease.  9. History of tobacco abuse.   10. Secondary hyperparathyroidism.  11. History of bacterial pneumonia.  12. History of hyperkalemia.   MEDICATIONS:  1. Prezista 800 mg p.o. daily.  2. NovoLog per sliding scale.  3. Multivitamin 1 p.o. daily.  4. Zestril 5 mg p.o. daily.  5. Lantus 8 units subcutaneous at bedtime.  6. Norvasc 5 mg p.o. daily.   ALLERGIES: No known drug allergies.   SOCIAL HISTORY: The patient is single and lives alone. No children. Smokes a few cigarettes per day. No history of alcohol abuse.   FAMILY HISTORY: Positive for hypertension, diabetes, cervical cancer, and pancreatic cancer.   REVIEW OF SYSTEMS: CONSTITUTIONAL: No fever or change in weight. EYES: No blurred or double vision. No glaucoma. ENT: No tinnitus or hearing loss. No nasal discharge or bleeding. No difficulty swallowing. RESPIRATORY: The patient has had cough but no wheezing.  Denies hemoptysis. Does have painful respiration. CARDIOVASCULAR: Chest pain as per HPI. No orthopnea. No palpitations or syncope. GI: The patient has had nausea, vomiting, and diarrhea. Has abdominal pain. No hematemesis or rectal bleeding. GU: No dysuria or hematuria. No incontinence. ENDOCRINE: No polyuria or polydipsia. No heat or cold intolerance. HEMATOLOGIC: The patient has chronic anemia but denies easy bruising or bleeding. LYMPHATIC: No swollen glands. MUSCULOSKELETAL: The patient denies pain in his neck, shoulders, knees, or hips. Does have back pain. No gout. NEUROLOGIC: No numbness. Denies migraines, stroke, or seizures. PSYCHIATRIC: The patient denies anxiety, insomnia, or depression.   PHYSICAL EXAMINATION:  GENERAL: The patient is chronically ill-appearing but in no acute distress.   VITAL SIGNS: Vital signs are currently remarkable for a blood pressure of 186/91 with a heart rate of 100 and a respiratory rate of 20. He is afebrile.   HEENT: Normocephalic, atraumatic. Pupils equally round and reactive to light and accommodation. Extraocular movements are intact. Sclerae are anicteric. Conjunctivae are clear. Oropharynx is clear.   NECK: Supple without jugular venous distention or bruits. No adenopathy or thyromegaly is noted.   LUNGS: Lungs revealed scattered rhonchi without wheezes or rales. No dullness.   CARDIAC: Regular rate and rhythm with normal S1, S2. No significant rubs or gallops. There is a 2/6 systolic murmur noted.   ABDOMEN: Soft but diffusely tender. Normal active bowel sounds. No organomegaly or masses were appreciated.   EXTREMITIES: Without clubbing, cyanosis, edema. Pulses were 2+ bilaterally.   SKIN: Warm and dry without rash or lesions.   NEUROLOGIC: Cranial nerves II  through XII grossly intact. Deep tendon reflexes were symmetric. Motor and sensory exam is nonfocal.   PSYCHIATRIC: A patient who was alert and oriented to person, place, and time. He was  cooperative and used good judgment.   LABORATORY DATA: White count was 2.8 with a hemoglobin of 8.3. Potassium was 5.9. Troponin was 0.03 with a total CK of 77. Reticulocyte count was 2.16. GFR was 5 with a BUN of 75 and a creatinine of 11.56. Sodium was 134. Lipase was normal at 214. Chest x-ray revealed left-sided infiltrate consistent with pneumonia. EKG revealed sinus rhythm with no acute ischemic changes.   ASSESSMENT:  1. Pneumonia.  2. Hyperkalemia.  3. End-stage renal disease, on hemodialysis.  4. Sickle cell crisis.  5. Anemia of chronic disease.  6. Type 2 diabetes mellitus.  7. Benign hypertension.   PLAN: The patient will be admitted to the floor and started on IV fluids with IV Dilaudid as needed for pain and IV Zofran as needed for nausea. We will begin IV antibiotics for his pneumonia. We will consult Nephrology urgently because of his hyperkalemia and need for dialysis. We will continue his insulin regimen for now and follow his sugars with Accu-Cheks before meals and at bedtime and add sliding scale insulin as needed. We will monitor his blood pressure closely. Followup routine labs in the morning. Followup chest x-ray in the morning.     Further treatment and evaluation will depend upon the patient's progress.   TOTAL TIME SPENT ON THIS PATIENT: 50 minutes.    ____________________________ Duane LopeJeffrey D. Judithann SheenSparks, MD jds:vtd D: 05/04/2012 21:10:06 ET T: 05/05/2012 06:59:54 ET JOB#: [id number]  cc: Duane LopeJeffrey D. Judithann SheenSparks, MD, <Dictator> Valisa Karpel Rodena Medin Seana Underwood MD ELECTRONICALLY SIGNED 05/06/2012 13:47

## 2015-04-26 IMAGING — CR DG CHEST 2V
2 series · 2 of 2 positions shown · non-contrast
Comparison: Chest radiograph July 08, 2014

CLINICAL DATA: Chest pain

EXAM:
CHEST  2 VIEW

[w chest pa]
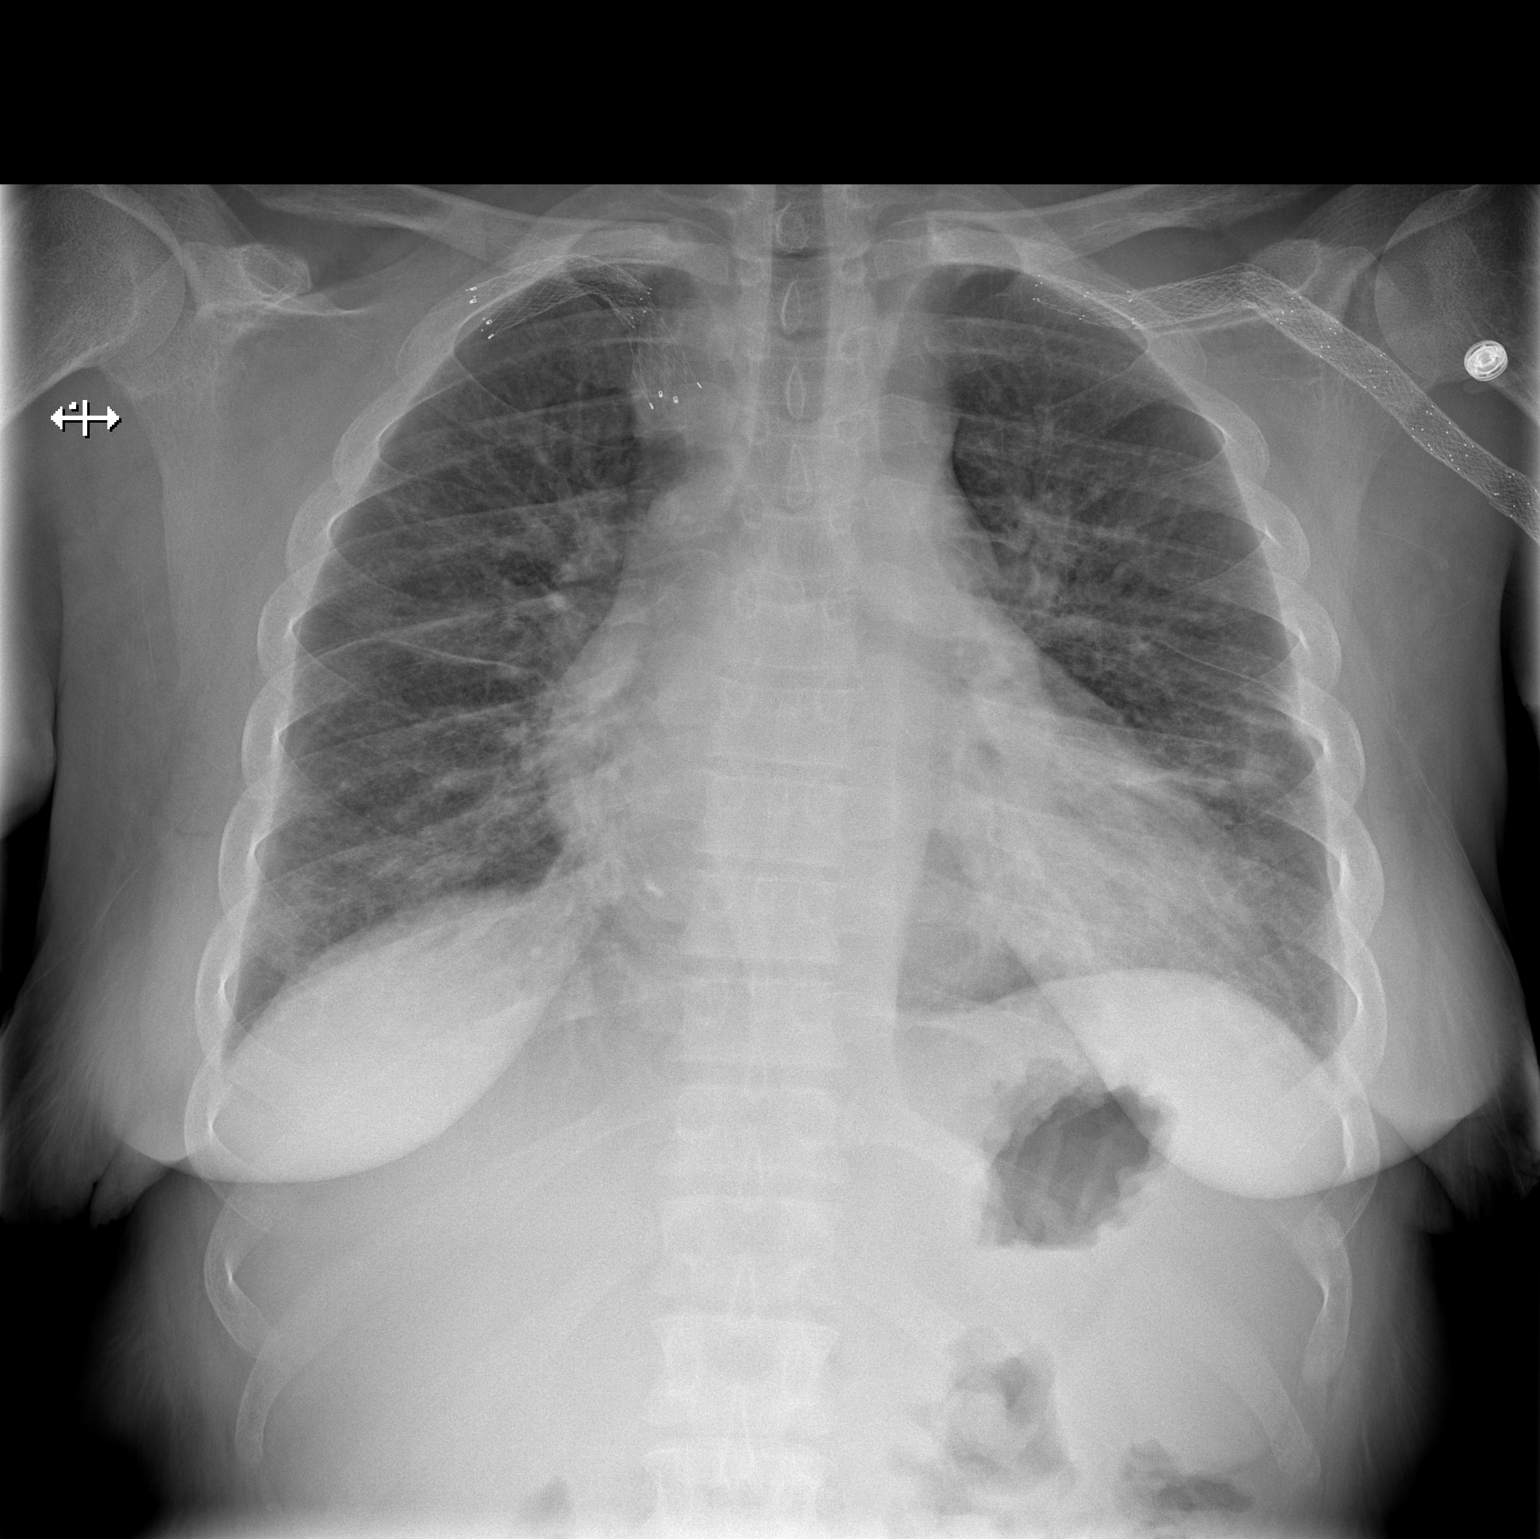

[w chest lat]
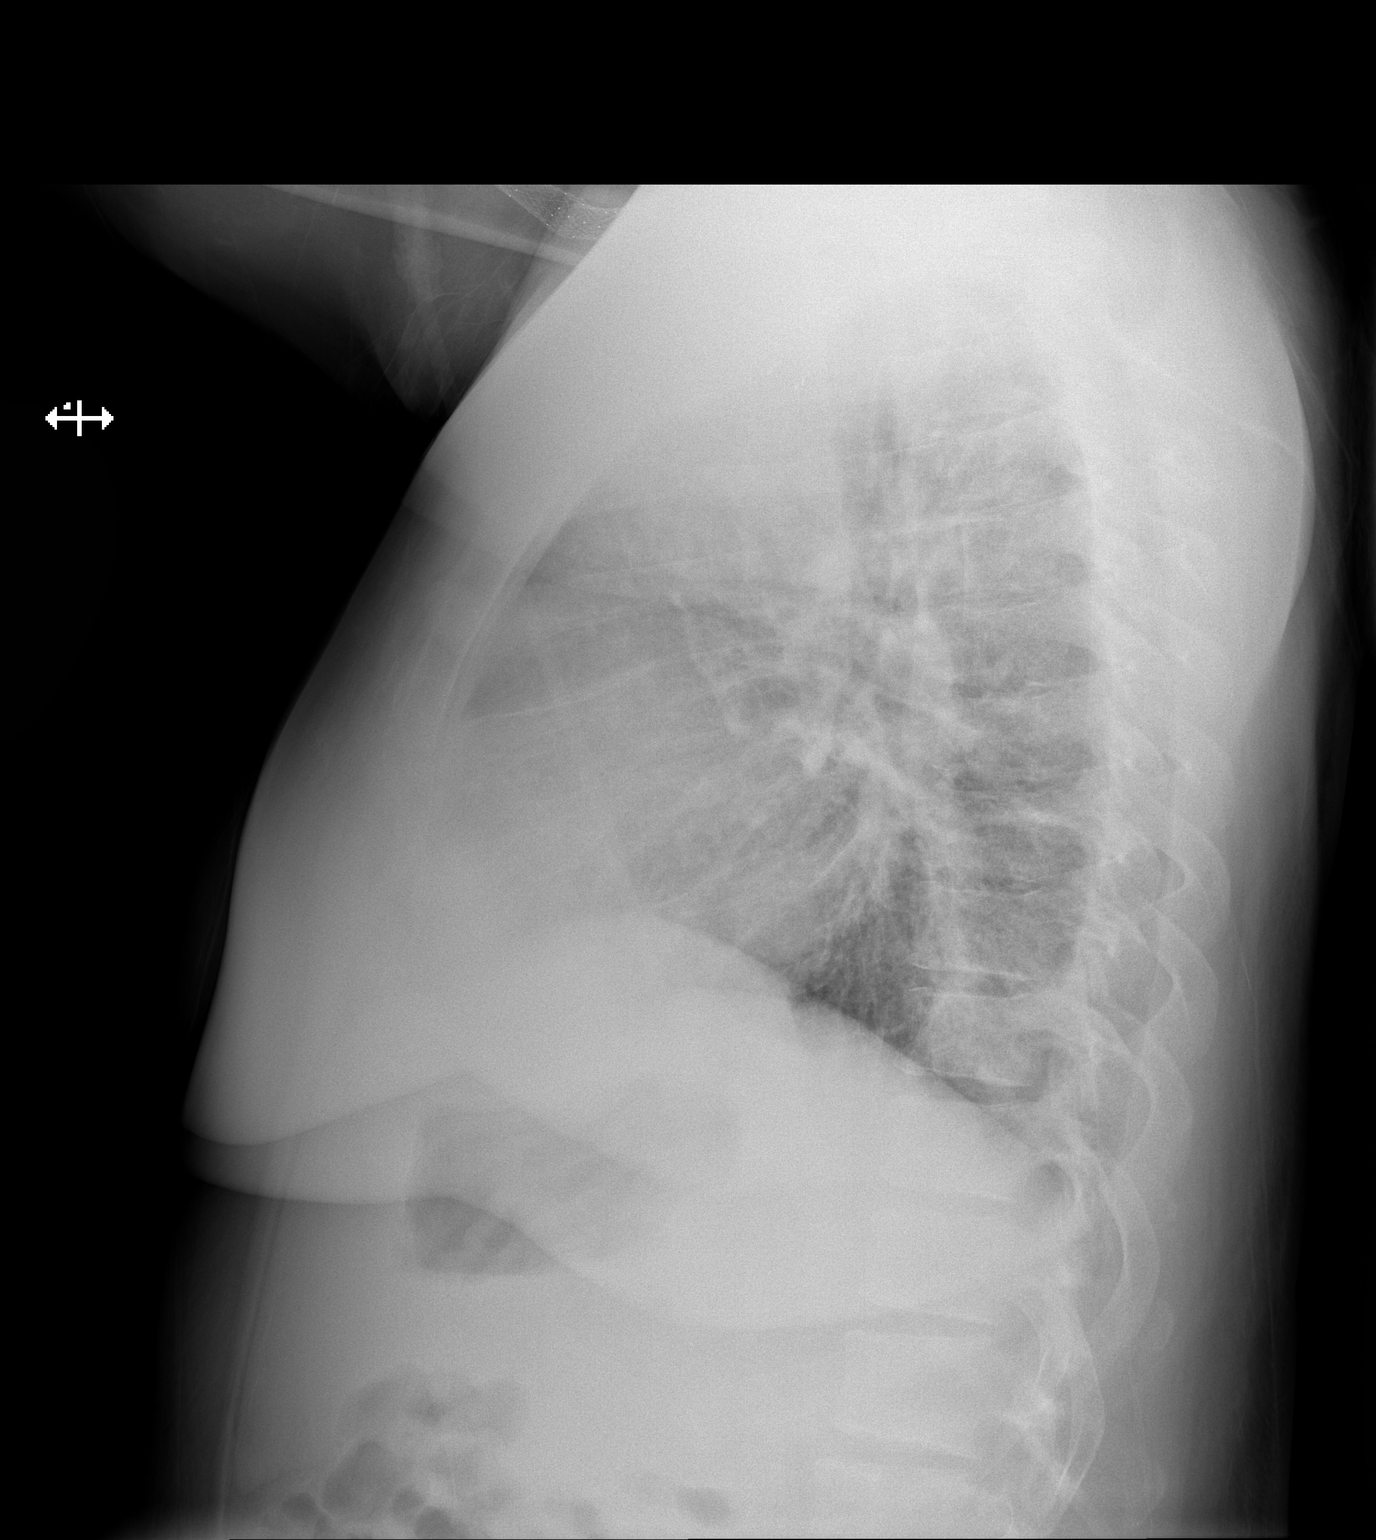

[2 of 2 positions shown; findings below may reference images not displayed]

FINDINGS: The cardiac silhouette appears mild to moderately enlarged,
decreased from prior examination. Mild interstitial prominence,
decreased from prior examination. Patchy left midlung zone airspace
opacity, decreased. No pleural effusions. No pneumothorax.

Multiple vascular stents.  Osseous structures are nonsuspicious.
IMPRESSION: Decreasing cardiomegaly, decreasing interstitial and improved
alveolar airspace opacities.

  By: Maurya Inocencio

## 2015-05-22 IMAGING — CR DG CHEST 2V
1 series · 2 of 2 positions shown · non-contrast
Comparison: 07/09/2014

CLINICAL DATA: Sickle cell anemia crisis.

EXAM:
CHEST - 2 VIEW

[Series 1: w chest pa · 0.14mm/px · 2 of 2 slices shown]
[im 1/2]
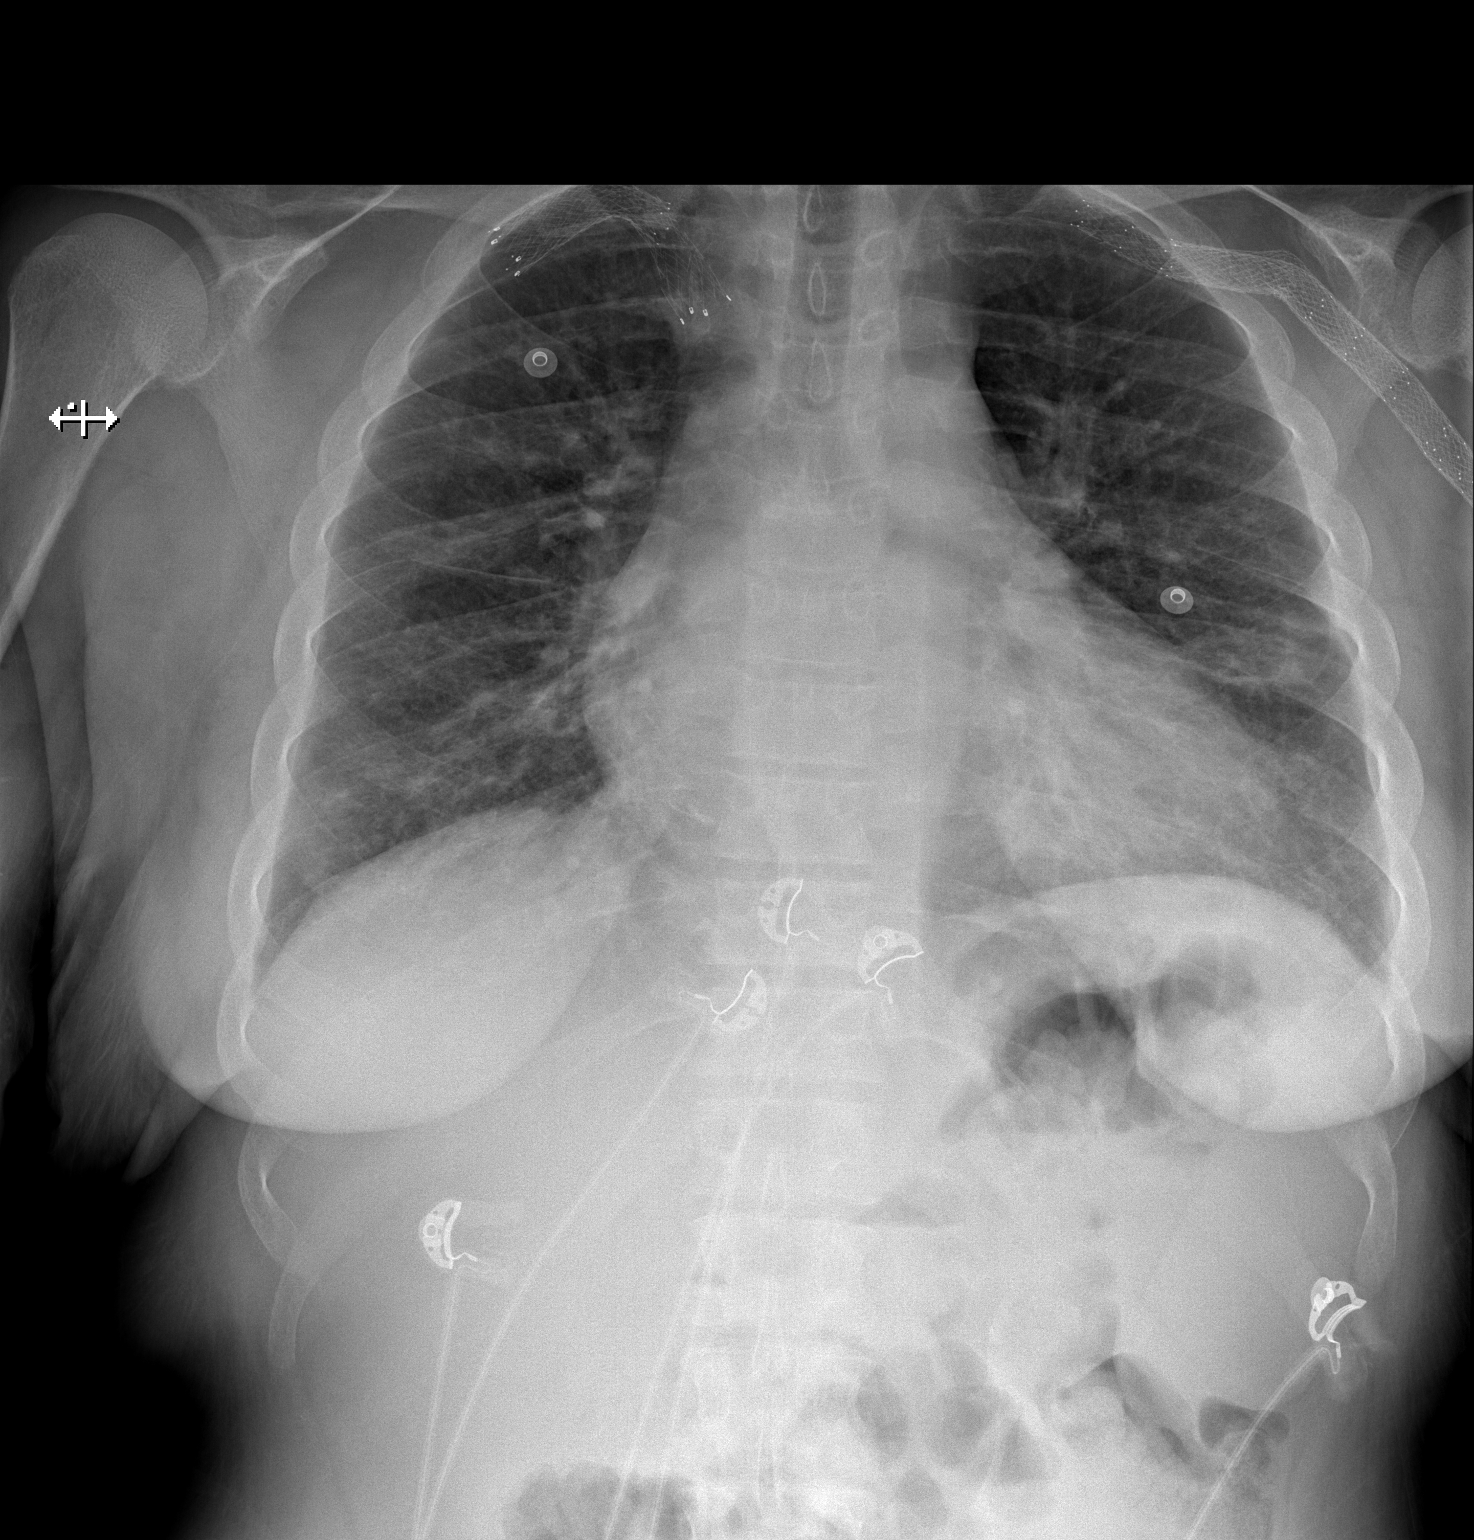
[im 2/2]
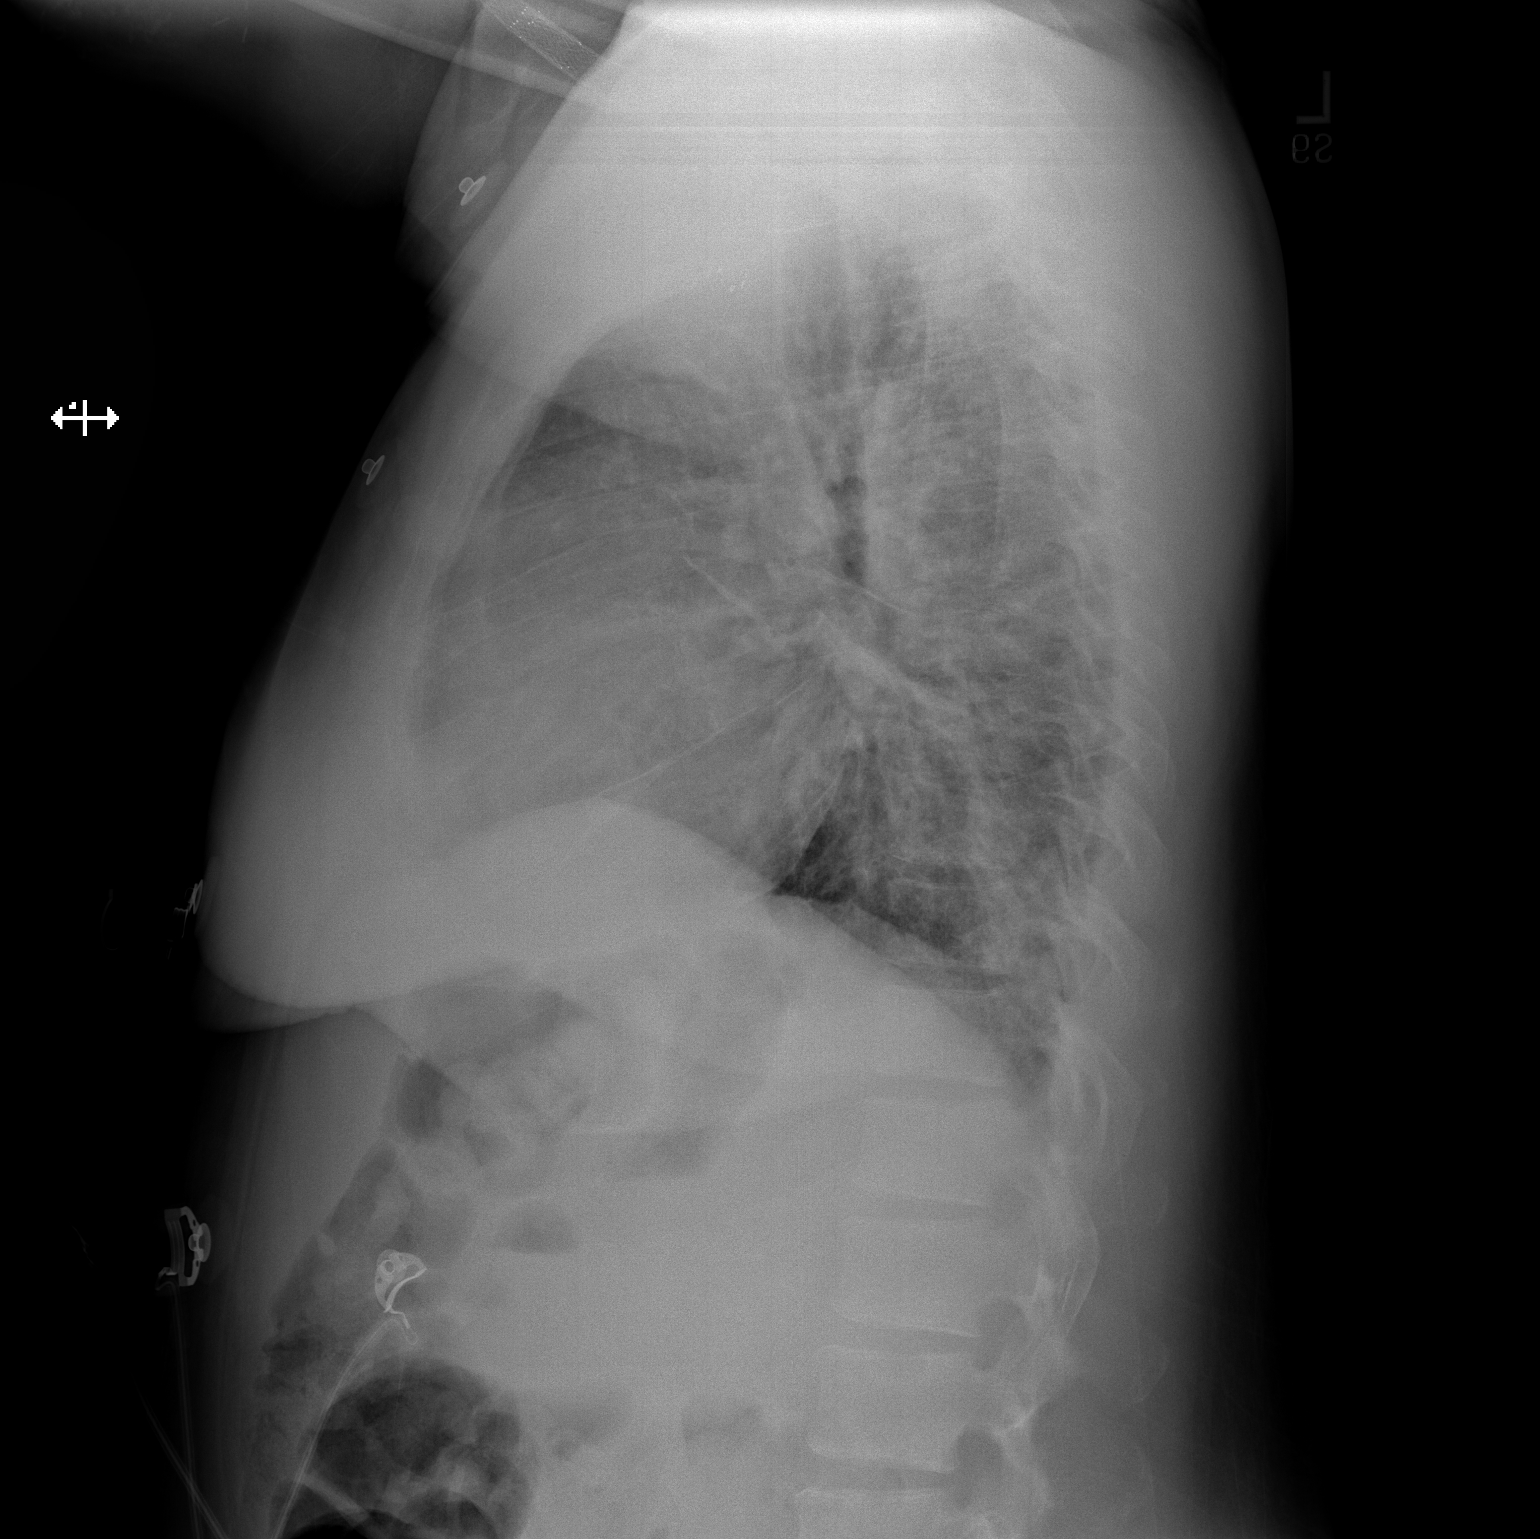

[2 of 2 positions shown; findings below may reference images not displayed]

FINDINGS: The heart size is stable and at the upper limits of normal. There is
stable pulmonary bronchovascular prominence without overt edema or
focal airspace consolidation. Stable scarring in both lower lungs.
Stable appearance of vascular stents bilaterally. The visualized
bony thorax is unremarkable.
IMPRESSION: No active disease.

## 2015-10-16 ENCOUNTER — Emergency Department: Admit: 2015-10-17 | Payer: PRIVATE HEALTH INSURANCE | Primary: Family Medicine

## 2015-10-16 ENCOUNTER — Inpatient Hospital Stay
Admit: 2015-10-16 | Discharge: 2015-11-03 | Payer: PRIVATE HEALTH INSURANCE | Attending: Internal Medicine | Admitting: Internal Medicine

## 2015-10-16 DIAGNOSIS — I120 Hypertensive chronic kidney disease with stage 5 chronic kidney disease or end stage renal disease: Principal | ICD-10-CM

## 2015-10-16 NOTE — ED Provider Notes (Signed)
HPI Comments: 79:03 PM male 34 y.o. with a history of HTN, CKD, avascular necrosis and subsequent replacement of the right hip, non-compliance with renal dialysis and HIV presenting to the ED for evaluation of nausea/ vomiting. Notes that he has not had dialysis in 2 weeks due to what his jail nurse states is a clogged dialysis access. He notes that he was brought from a [place] because the jail was not equipped to treat the clogged dyalyisis site. Notes that he has had similar condition with his dialysis access in the past which was treated by he is last nephrologist at [facility], Dr. Delena Serve. He has history of vascular necrosis and left hip replacement. Notes that he does not produce urine. He lays on 2 pillows at night for comfort. He notes that he has a left chest port which cannot be used for dialysis and is to only be used for vascular access and blood transfusions.  Per note from the [facility], the patient had a seizure today and was sent for evaluation of apparent seizure.  He also c/o knee and ankle joint pain which he associates with sickle cell crisis. Per medical records, patient hemoglobin levels do not indicate that he has sickle cell disease and he has malingered in the past at [facility] and [facility].    PCP: Phys Other, MD         Past Medical History:   Diagnosis Date   ??? AVN (avascular necrosis of bone) (HCC)      right hip   ??? Chronic kidney disease    ??? Chronic kidney disease (CKD), stage V (HCC)    ??? Dialysis patient [facility])    ??? ESRD on hemodialysis (HCC)    ??? HIV (human immunodeficiency virus infection) (HCC)    ??? HTN (hypertension) 07/06/2012   ??? HTN (hypertension)    ??? Hypertension    ??? Malingering      Sickle Cell testing negative   ??? Malingering      Patient states has had sickle cell disease since age of 33, hemoglobin electrophoresis at [facility] and [facility] both negative for for sickle cell disease   ??? Noncompliance of patient with renal dialysis (HCC) 09/06/2013    ??? Sickle cell anemia (HCC)      NEGATIVE test. Likely patient states this Dx for secondary gain   ??? Sickle cell disease (HCC)      TOLD BY PATIENT AND NOT TRUE   ??? Stroke (HCC)      2014 pts states he has weakness to right side upper and lower extremities        Past Surgical History:   Procedure Laterality Date   ??? Hx orthopaedic  2012     Rt hip decompression   ??? Hx orthopaedic       rt hip    ??? Hx vascular access       av  fistula right arm.   ??? Hx orthopaedic       RIGHT HIP   ??? Hx vascular access       old avg left arm, avf right arm   ??? Hx orthopaedic       right hip surgery   ??? Hx orthopaedic       right hip    ??? Vascular surgery procedure unlist       Dialysis Access         Family History:   Problem Relation Age of Onset   ???  Hypertension Mother    ??? Cancer Mother    ??? Sickle Cell Anemia Mother    ??? Hypertension Father    ??? Sickle Cell Anemia Father    ??? Sickle Cell Anemia Sister    ??? Sickle Cell Anemia Other        Social History     Social History   ??? Marital status: SINGLE     Spouse name: N/A   ??? Number of children: N/A   ??? Years of education: N/A     Occupational History   ??? Not on file.     Social History Main Topics   ??? Smoking status: Former Smoker   ??? Smokeless tobacco: Not on file   ??? Alcohol use No   ??? Drug use: No   ??? Sexual activity: No     Other Topics Concern   ??? Not on file     Social History Narrative    ** Merged History Encounter **         ** Merged History Encounter **         ** Merged History Encounter **         ** Merged History Encounter **         ** Merged History Encounter **              ALLERGIES: Review of patient's allergies indicates no known allergies.    Review of Systems   Constitutional: Negative for activity change, appetite change, diaphoresis and fever.   HENT: Negative for congestion, dental problem, ear pain, hearing loss, nosebleeds, postnasal drip, sinus pressure, sneezing and tinnitus.    Eyes: Negative for photophobia, discharge, redness and visual disturbance.    Respiratory: Negative for cough, choking, shortness of breath, wheezing and stridor.    Cardiovascular: Negative for chest pain, palpitations and leg swelling.   Gastrointestinal: Positive for nausea and vomiting. Negative for abdominal distention, abdominal pain, anal bleeding and blood in stool.   Genitourinary: Negative for decreased urine volume, difficulty urinating, discharge, dysuria, frequency, hematuria, penile swelling, scrotal swelling, testicular pain and urgency.   Musculoskeletal: Positive for arthralgias. Negative for back pain, gait problem, joint swelling, myalgias and neck pain.   Skin: Negative for color change and pallor.   Neurological: Negative for dizziness, tremors, seizures, syncope and headaches.   Hematological: Negative for adenopathy. Does not bruise/bleed easily.   Psychiatric/Behavioral: Negative for agitation, behavioral problems, confusion and hallucinations. The patient is not nervous/anxious.        Vitals:    10/16/15 2100 10/16/15 2115 10/16/15 2130 10/16/15 2145   BP: (!) 191/95 (!) 182/93 (!) 191/100 (!) 197/97   Pulse: 87 85 87 85   Resp: 17 18 22 20    Temp:       SpO2: 100% 99% 100% 100%   Weight:       Height:                Physical Exam   Constitutional: He is oriented to person, place, and time. He appears well-developed and well-nourished. No distress.   HENT:   Head: Normocephalic and atraumatic.   Right Ear: External ear normal.   Left Ear: External ear normal.   Nose: Nose normal.   Mouth/Throat: Oropharynx is clear and moist.   Eyes: Conjunctivae and EOM are normal. Pupils are equal, round, and reactive to light. Right eye exhibits no discharge. No scleral icterus.   Neck: Normal range of motion. Neck supple. No JVD present.  No thyromegaly present.   Cardiovascular: Normal rate, regular rhythm and intact distal pulses.  Exam reveals no gallop and no friction rub.    No murmur heard.  Pulmonary/Chest: No respiratory distress. He has no wheezes. He has no  rales. He exhibits no tenderness.   Left chest permanent catheter   Abdominal: Soft. He exhibits no distension and no mass. There is no tenderness. There is no rebound and no guarding.   Musculoskeletal: Normal range of motion. He exhibits edema (3+ bilateral lower extremities).   Left arm dialysis graft with no thrill.   Lymphadenopathy:     He has no cervical adenopathy.   Neurological: He is alert and oriented to person, place, and time. No cranial nerve deficit. Coordination normal.   R leg weakness 3+ to 5+   Skin: Skin is warm and dry. No rash noted. No erythema.   Facial hyperpigmentation   Psychiatric: He has a normal mood and affect. His behavior is normal. Judgment normal.   Nursing note and vitals reviewed.       MDM  Number of Diagnoses or Management Options  ESRD on dialysis [facility]):   Hypervolemia, unspecified hypervolemia type:   Diagnosis management comments: Two problems; SS crisis Will give pain meds, follow  CKD/dialysis, last rung 2 weeks prior, with a clotted left fistula. Will trend labs, follow    Patient states that his left vascular access is not for dialysis but per Radiology, left chest port is in fact a dialysis catheter. Dr. Ardelle Park believes catheter port may not be functional and would like the patient to be admitted to check the function. Furthermore, records show that hemoglobin electrophoresis at [facility] hospital and [facility] were both negative for sickle cell disease against patient complaints of sickle cell crisis. Patient will be admitted for dialysis and catheter access function. Dr. Ardelle Park will evaluate the patient.         Amount and/or Complexity of Data Reviewed  Clinical lab tests: ordered and reviewed  Tests in the radiology section of CPT??: ordered and reviewed  Decide to obtain previous medical records or to obtain history from someone other than the patient: yes  Obtain history from someone other than the patient: yes  Review and summarize past medical records: yes   Discuss the patient with other providers: yes    Risk of Complications, Morbidity, and/or Mortality  Presenting problems: moderate    Patient Progress  Patient progress: stable    ED Course       Procedures      Vitals:  Patient Vitals for the past 12 hrs:   Temp Pulse Resp BP SpO2   10/16/15 2145 - 85 20 (!) 197/97 100 %   10/16/15 2130 - 87 22 (!) 191/100 100 %   10/16/15 2115 - 85 18 (!) 182/93 99 %   10/16/15 2100 - 87 17 (!) 191/95 100 %   10/16/15 2045 - 91 24 (!) 187/93 100 %   10/16/15 2030 - 88 19 (!) 196/101 100 %   10/16/15 2015 - 89 24 (!) 217/101 100 %   10/16/15 2006 - 89 19 - 100 %   10/16/15 2005 - 87 19 - 100 %   10/16/15 2002 97.7 ??F (36.5 ??C) 88 25 (!) 212/96 100 %         Medications ordered:   Medications   acetaminophen (TYLENOL) tablet 650 mg (not administered)   HYDROmorphone (PF) (DILAUDID) injection 1 mg (1 mg IntraVENous Given 10/16/15 2101)  promethazine (PHENERGAN) injection 12.5 mg (12.5 mg IntraMUSCular Given 10/16/15 2101)         Lab findings:  Recent Results (from the past 12 hour(s))   PRO-BNP    Collection Time: 10/16/15  8:51 PM   Result Value Ref Range    NT pro-BNP 38759 (H) 0 - 450 PG/ML   CBC WITH AUTOMATED DIFF    Collection Time: 10/16/15  8:51 PM   Result Value Ref Range    WBC 3.1 (L) 4.6 - 13.2 K/uL    RBC 2.99 (L) 4.70 - 5.50 M/uL    HGB 9.0 (L) 13.0 - 16.0 g/dL    HCT 16.128.0 (L) 09.636.0 - 48.0 %    MCV 93.6 74.0 - 97.0 FL    MCH 30.1 24.0 - 34.0 PG    MCHC 32.1 31.0 - 37.0 g/dL    RDW 04.518.1 (H) 40.911.6 - 14.5 %    PLATELET 91 (L) 135 - 420 K/uL    MPV 8.5 (L) 9.2 - 11.8 FL    NEUTROPHILS 57 40 - 73 %    LYMPHOCYTES 23 21 - 52 %    MONOCYTES 14 (H) 3 - 10 %    EOSINOPHILS 6 (H) 0 - 5 %    BASOPHILS 0 0 - 2 %    ABS. NEUTROPHILS 1.7 (L) 1.8 - 8.0 K/UL    ABS. LYMPHOCYTES 0.7 (L) 0.9 - 3.6 K/UL    ABS. MONOCYTES 0.4 0.05 - 1.2 K/UL    ABS. EOSINOPHILS 0.2 0.0 - 0.4 K/UL    ABS. BASOPHILS 0.0 0.0 - 0.06 K/UL    DF AUTOMATED     METABOLIC PANEL, BASIC     Collection Time: 10/16/15  8:51 PM   Result Value Ref Range    Sodium 135 (L) 136 - 145 mmol/L    Potassium 5.3 3.5 - 5.5 mmol/L    Chloride 103 100 - 108 mmol/L    CO2 23 21 - 32 mmol/L    Anion gap 9 3.0 - 18 mmol/L    Glucose 133 (H) 74 - 99 mg/dL    BUN 71 (H) 7.0 - 18 MG/DL    Creatinine 81.1910.10 (H) 0.6 - 1.3 MG/DL    BUN/Creatinine ratio 7 (L) 12 - 20      GFR est AA 7 (L) >60 ml/min/1.5673m2    GFR est non-AA 6 (L) >60 ml/min/1.3173m2    Calcium 7.4 (L) 8.5 - 10.1 MG/DL   MAGNESIUM    Collection Time: 10/16/15  8:51 PM   Result Value Ref Range    Magnesium 2.2 1.8 - 2.4 mg/dL         X-Ray, CT or other radiology findings or impressions:  XR CHEST PORT      ED PHYSICIAN READING: Dialysis catheter in place, no acute changes. Thomes DinningVictor T Harriette Tovey, MD  RADIOLOGY RESIDENT   Dialysis catheter enters via left-sided approach and terminates near the  junction of the SVC and right atrium. The cardiomediastinal silhouette remains  enlarged. The lungs are clear. Vascular stent present in the right subclavian  and left brachiocephalic. No effusion.  IMPRESSION: Mild cardiac enlargement. Lungs clear.         Progress notes, Consult notes or additional Procedure notes:   10:08 PM Discussed care with Dr. Ardelle ParkHaque, Specialty: Nephrology. Standard discussion; including history of patient???s chief complaint, available diagnostic results and treatment course. He requests the admission of he pt.    10:12 PM Discussed care with Dr. Celine Mansesai, Specialty:  Hospitalist. Standard discussion; including history of patient???s chief complaint, available diagnostic results and treatment course. He agrees to the admission of the patient.      Disposition:  Diagnosis:   1. ESRD on dialysis [facility])        Disposition: Admitted.    Follow-up Information     Follow up With Details Comments Contact Info    [facility] EMERGENCY DEPT Go to If symptoms worsen [address] [address]  [phone]    Italy McKenzie, MD Call in 2 days for follow up [address]   [address]  [address] [facility]  [place] [address]  [phone]             Patient's Medications   Start Taking    No medications on file   Continue Taking    CALCIUM ACETATE (PHOSLO) 667 MG CAP    Take  by mouth three (3) times daily (with meals).    CLONIDINE HCL (CATAPRES) 0.1 MG TABLET    Take 1 tablet by mouth two (2) times a day.    EPOETIN ALFA (EPOGEN) 10,000 UNIT/ML INJECTION    by SubCUTAneous route once.    FAMOTIDINE (PEPCID) 20 MG TABLET    Take 1 tablet by mouth daily.    FOLIC ACID (FOLVITE) 1 MG TABLET    Take 1 mg by mouth daily.    HYDROXYUREA (HYDREA) 500 MG CAPSULE    Take 500 mg by mouth daily.    LISINOPRIL (PRINIVIL, ZESTRIL) 10 MG TABLET    Take 1 tablet by mouth daily.   These Medications have changed    No medications on file   Stop Taking    No medications on file         Scribe Attestation  Chantel Taft scribing for and in the presence of Dr. Thomes Dinning, MD  10/16/15.    Physician Attestation  I personally performed the services described in the documentation, reviewed the documentation, as recorded by the scribe in my presence, and it accurately and completely records my words and actions.    Thomes Dinning, MD 10/16/15

## 2015-10-16 NOTE — H&P (Signed)
History and Physical    Patient: Tyler Ballard MRN: [id number]  SSN: [id number]    Date of Birth: 25-May-1981  Age: 34 y.o.  Sex: male      Subjective:      Tyler Ballard is a 34 y.o. male with PMH of Obesity ,Chronic anemia requiring BT,  HTN , ESRD on HD from [place] being admitted for missed HD and related electrolyte imbalance     According to patient he missed his HD for 2 weeks as he was incarcerated and their they did not have facility for HD . He developed leg jerky movements. He was brought to ED and was found to have - high BNP   Patient was admitted Dr Ardelle Park - renal MD was consulted by ED   Patient's HD access AV fistula is not functioning,      Patient denies CP/Palpitation / SOB/ Cough / Fevr / Chills / c/o pruritus     Full CODE   DVT prophylaxis - heparin     Overview :  Per Notes in EPIC   - h/o HIV  - h/o bacteremia   Past Medical History   Diagnosis Date   ??? AVN (avascular necrosis of bone) (HCC)      right hip   ??? Chronic kidney disease    ??? Chronic kidney disease (CKD), stage V (HCC)    ??? Dialysis patient [facility])    ??? ESRD on hemodialysis (HCC)    ??? HIV (human immunodeficiency virus infection) (HCC)    ??? HTN (hypertension) 07/06/2012   ??? HTN (hypertension)    ??? Hypertension    ??? Malingering      Sickle Cell testing negative   ??? Malingering      Patient states has had sickle cell disease since age of 28, hemoglobin electrophoresis at [facility] and [facility] both negative for for sickle cell disease   ??? Noncompliance of patient with renal dialysis (HCC) 09/06/2013   ??? Sickle cell anemia (HCC)      NEGATIVE test. Likely patient states this Dx for secondary gain   ??? Sickle cell disease (HCC)      TOLD BY PATIENT AND NOT TRUE   ??? Stroke (HCC)      2014 pts states he has weakness to right side upper and lower extremities      Past Surgical History   Procedure Laterality Date   ??? Hx orthopaedic  2012     Rt hip decompression   ??? Hx orthopaedic       rt hip    ??? Hx vascular access        av  fistula right arm.   ??? Hx orthopaedic       RIGHT HIP   ??? Hx vascular access       old avg left arm, avf right arm   ??? Hx orthopaedic       right hip surgery   ??? Hx orthopaedic       right hip    ??? Vascular surgery procedure unlist       Dialysis Access      Family History   Problem Relation Age of Onset   ??? Hypertension Mother    ??? Cancer Mother    ??? Sickle Cell Anemia Mother    ??? Hypertension Father    ??? Sickle Cell Anemia Father    ??? Sickle Cell Anemia Sister    ??? Sickle Cell Anemia Other  Social History   Substance Use Topics   ??? Smoking status: Former Smoker   ??? Smokeless tobacco: Not on file   ??? Alcohol use No      Prior to Admission medications    Medication Sig Start Date End Date Taking? Authorizing Provider   hydroxyurea (HYDREA) 500 mg capsule Take 500 mg by mouth daily.   Yes Phys Other, MD   folic acid (FOLVITE) 1 mg tablet Take 1 mg by mouth daily.   Yes Phys Other, MD   cloNIDine HCl (CATAPRES) 0.1 mg tablet Take 1 tablet by mouth two (2) times a day. 11/04/13  Yes Vishwas Graylon Gunning, MD   lisinopril (PRINIVIL, ZESTRIL) 10 mg tablet Take 1 tablet by mouth daily. 11/06/13  Yes Vishwas Graylon Gunning, MD   famotidine (PEPCID) 20 mg tablet Take 1 tablet by mouth daily. 11/04/13   Vishwas Graylon Gunning, MD   epoetin alfa (EPOGEN) 10,000 unit/mL injection by SubCUTAneous route once.   Yes Phys Other, MD   calcium acetate (PHOSLO) 667 mg cap Take  by mouth three (3) times daily (with meals).   Yes Phys Other, MD        No Known Allergies    Review of Systems:  A comprehensive review of systems was negative except for that written in the History of Present Illness.    Objective:     Vitals:    11/10/2015 0000 2015/11/10 0015 11/10/2015 0030 2015/11/10 0125   BP: 179/86 (!) 195/92 182/83 115/68   Pulse: 84 85 86 87   Resp: $Remo'15 17 20 18   'lHBzS$ Temp:    97.4 ??F (36.3 ??C)   SpO2: 99% 98% 99% 95%   Weight:    99 kg (218 lb 4.1 oz)   Height:    '5\' 7"'$  (1.702 m)        Physical Exam:   General appearance - alert, well appearing, and in no distress, oriented to person, place, and time and overweight  Mental status - alert, oriented to person, place, and time, normal mood, behavior, speech, dress, motor activity, and thought processes  Eyes - pupils equal and reactive, extraocular eye movements intact  Ears - bilateral TM's and external ear canals normal  Nose - normal and patent, no erythema, discharge or polyps  Mouth - mucous membranes moist, pharynx normal without lesions  Neck - supple, no significant adenopathy  Chest - clear to auscultation, no wheezes, rales or rhonchi, symmetric air entry  Heart - normal rate, regular rhythm, normal S1, S2, no murmurs, rubs, clicks or gallops  Abdomen - soft, nontender, nondistended, no masses or organomegaly  Neurological - alert, oriented, normal speech, no focal findings or movement disorder noted, motor and sensory grossly normal bilaterally  Musculoskeletal - no joint tenderness, deformity or swelling  Extremities - peripheral pulses normal, no pedal edema, no clubbing or cyanosis  Skin - normal coloration and turgor, no rashes, no suspicious skin lesions noted     Assessment:     Hospital Problems  Date Reviewed: Nov 10, 2015          Codes Class Noted POA    HIV (human immunodeficiency virus infection) ([place]) ICD-10-CM: Z21  ICD-9-CM: V08  11-10-2015 Unknown        ESRD (end stage renal disease) ([place]) ICD-10-CM: N18.6  ICD-9-CM: 585.6  Nov 10, 2015 Unknown        Renal failure ICD-10-CM: N19  ICD-9-CM: 586  10/16/2015 Unknown        ESRD (end stage renal  disease) on dialysis [facility]) (Chronic) ICD-10-CM: N18.6, Z99.2  ICD-9-CM: 585.6, V45.11  07/06/2012 Yes        HTN (hypertension) (Chronic) ICD-10-CM: I10  ICD-9-CM: 401.9  07/06/2012 Yes              CBC:  Lab Results   Component Value Date/Time    WBC 3.1 10/16/2015 08:51 PM    HGB 9.0 10/16/2015 08:51 PM    HCT 28.0 10/16/2015 08:51 PM    PLATELET 91 10/16/2015 08:51 PM    MCV 93.6 10/16/2015 08:51 PM         CMP:  Lab Results   Component Value Date/Time    SODIUM 135 10/16/2015 08:51 PM    POTASSIUM 5.3 10/16/2015 08:51 PM    CHLORIDE 103 10/16/2015 08:51 PM    CO2 23 10/16/2015 08:51 PM    ANION GAP 9 10/16/2015 08:51 PM    GLUCOSE 133 10/16/2015 08:51 PM    BUN 71 10/16/2015 08:51 PM    CREATININE 10.10 10/16/2015 08:51 PM    BUN/CREATININE RATIO 7 10/16/2015 08:51 PM    GFR EST AA 7 10/16/2015 08:51 PM    GFR EST NON-AA 6 10/16/2015 08:51 PM    CALCIUM 7.4 10/16/2015 08:51 PM    ALT 39 03/28/2014 11:20 PM    AST 32 03/28/2014 11:20 PM    ALK. PHOSPHATASE 227 03/28/2014 11:20 PM    PROTEIN, TOTAL 9.4 03/28/2014 11:20 PM    ALBUMIN 3.0 03/28/2014 11:20 PM    GLOBULIN 6.4 03/28/2014 11:20 PM    A-G RATIO 0.5 03/28/2014 11:20 PM        PT/INR  Lab Results   Component Value Date/Time    INR 1.2 08/03/2013 04:35 AM    INR 1.4 07/12/2013 04:01 AM    INR 1.1 12/19/2012 05:49 AM    PROTHROMBIN TIME 15.6 08/03/2013 04:35 AM    PROTHROMBIN TIME 14.6 07/12/2013 04:01 AM    PROTHROMBIN TIME 11.6 12/19/2012 05:49 AM            EKG: No results found for this or any previous visit.       Plan:   ESRD   - will need HD in AM   - Monitor lytes - defer to renal     HTN  - Continue current meds     Pruritus   - Benadryl PRN     High BNP   - patient is asymptomatic   - chest xray   IMPRESSION: Mild cardiac enlargement. Lungs clear.  - Follow ECHO - R/o CHF     HIV history   - need referral to HIV clinic   - Follow CD4 count   - leucopenia present   ??    Signed By: Eyvonne Left, MD     October 17, 2015

## 2015-10-16 NOTE — ED Triage Notes (Signed)
Pt has jail attendant present. [facility]. Pt stated he became SOB, pain, with n/v. Pt states he is a dialysis pt. Has dialysis M-W-F.  Pt states he has not had dialysis for 14 days. Pt is AOX4; pt is on stretcher, on monitor.

## 2015-10-16 NOTE — ED Notes (Signed)
Hourly rounding complete.   Safety  Pt resting   [x  ]  On stretcher with side rails up and bed in locked position, call bell within reach  [  ]  Sitting in chair with casters locked, call bell within reach    Toileting  [ x ] pt denies need to use bathroom  [  ] pt assisted to bathroom  [  ] pt assisted with bedpan  [  ] pt independent to bathroom as needed    Ongoing Plan of Care  Plan of care and expected time for test and results reviewed with pt.    Pain Management / Comfort  [  ] dimmed lights  [  ] warm blanket provided  [  ] pain assessed  [x  ] monitor alarms reviewed    Pt has 2 jail attendants bedside.

## 2015-10-16 NOTE — ED Notes (Signed)
Hourly rounding complete.   Safety  Pt resting   [x ]  On stretcher with side rails up and bed in locked position, call bell within reach  [  ]  Sitting in chair with casters locked, call bell within reach    Toileting  [x  ] pt denies need to use bathroom  [  ] pt assisted to bathroom  [  ] pt assisted with bedpan  [  ] pt independent to bathroom as needed    Ongoing Plan of Care  Plan of care and expected time for test and results reviewed with pt.    Pain Management / Comfort  [  ] dimmed lights  [  ] warm blanket provided  [  ] pain assessed  [ x ] monitor alarms reviewed

## 2015-10-17 LAB — TYPE AND SCREEN
ABO/Rh: B POS
Antibody Screen: NEGATIVE
Antigen Typing,(RBC): POSITIVE

## 2015-10-17 LAB — CBC WITH AUTOMATED DIFF
ABS. BASOPHILS: 0 10*3/uL (ref 0.0–0.06)
ABS. BASOPHILS: 0 10*3/uL (ref 0.0–0.1)
ABS. EOSINOPHILS: 0.2 10*3/uL (ref 0.0–0.4)
ABS. EOSINOPHILS: 0.2 10*3/uL (ref 0.0–0.4)
ABS. LYMPHOCYTES: 0.7 10*3/uL — ABNORMAL LOW (ref 0.9–3.6)
ABS. LYMPHOCYTES: 0.7 10*3/uL — ABNORMAL LOW (ref 0.9–3.6)
ABS. MONOCYTES: 0.4 10*3/uL (ref 0.05–1.2)
ABS. MONOCYTES: 0.3 10*3/uL (ref 0.05–1.2)
ABS. NEUTROPHILS: 1.7 10*3/uL — ABNORMAL LOW (ref 1.8–8.0)
ABS. NEUTROPHILS: 1.2 10*3/uL — ABNORMAL LOW (ref 1.8–8.0)
BASOPHILS: 0 % (ref 0–2)
BASOPHILS: 0 % (ref 0–2)
EOSINOPHILS: 6 % — ABNORMAL HIGH (ref 0–5)
EOSINOPHILS: 9 % — ABNORMAL HIGH (ref 0–5)
HCT: 28 % — ABNORMAL LOW (ref 36.0–48.0)
HCT: 27.5 % — ABNORMAL LOW (ref 36.0–48.0)
HGB: 9 g/dL — ABNORMAL LOW (ref 13.0–16.0)
HGB: 8.7 g/dL — ABNORMAL LOW (ref 13.0–16.0)
LYMPHOCYTES: 23 % (ref 21–52)
LYMPHOCYTES: 30 % (ref 21–52)
MCH: 30.1 PG (ref 24.0–34.0)
MCH: 30.2 PG (ref 24.0–34.0)
MCHC: 32.1 g/dL (ref 31.0–37.0)
MCHC: 31.6 g/dL (ref 31.0–37.0)
MCV: 93.6 FL (ref 74.0–97.0)
MCV: 95.5 FL (ref 74.0–97.0)
MONOCYTES: 14 % — ABNORMAL HIGH (ref 3–10)
MONOCYTES: 11 % — ABNORMAL HIGH (ref 3–10)
MPV: 8.5 FL — ABNORMAL LOW (ref 9.2–11.8)
MPV: 10.7 FL (ref 9.2–11.8)
NEUTROPHILS: 57 % (ref 40–73)
NEUTROPHILS: 50 % (ref 40–73)
PLATELET: 91 10*3/uL — ABNORMAL LOW (ref 135–420)
PLATELET: 109 10*3/uL — ABNORMAL LOW (ref 135–420)
RBC: 2.99 M/uL — ABNORMAL LOW (ref 4.70–5.50)
RBC: 2.88 M/uL — ABNORMAL LOW (ref 4.70–5.50)
RDW: 18.1 % — ABNORMAL HIGH (ref 11.6–14.5)
RDW: 18.2 % — ABNORMAL HIGH (ref 11.6–14.5)
WBC: 3.1 10*3/uL — ABNORMAL LOW (ref 4.6–13.2)
WBC: 2.4 10*3/uL — ABNORMAL LOW (ref 4.6–13.2)

## 2015-10-17 LAB — METABOLIC PANEL, COMPREHENSIVE
A-G Ratio: 0.4 — ABNORMAL LOW (ref 0.8–1.7)
ALT (SGPT): 16 U/L (ref 16–61)
AST (SGOT): 29 U/L (ref 15–37)
Albumin: 2.6 g/dL — ABNORMAL LOW (ref 3.4–5.0)
Alk. phosphatase: 163 U/L — ABNORMAL HIGH (ref 45–117)
Anion gap: 11 mmol/L (ref 3.0–18)
BUN/Creatinine ratio: 7 — ABNORMAL LOW (ref 12–20)
BUN: 75 MG/DL — ABNORMAL HIGH (ref 7.0–18)
Bilirubin, total: 0.5 MG/DL (ref 0.2–1.0)
CO2: 24 mmol/L (ref 21–32)
Calcium: 7.4 MG/DL — ABNORMAL LOW (ref 8.5–10.1)
Chloride: 102 mmol/L (ref 100–108)
Creatinine: 10.6 MG/DL — ABNORMAL HIGH (ref 0.6–1.3)
GFR est AA: 7 mL/min/{1.73_m2} — ABNORMAL LOW (ref >60)
GFR est non-AA: 6 mL/min/{1.73_m2} — ABNORMAL LOW (ref >60)
Globulin: 6.1 g/dL — ABNORMAL HIGH (ref 2.0–4.0)
Glucose: 122 mg/dL — ABNORMAL HIGH (ref 74–99)
Potassium: 5.5 mmol/L (ref 3.5–5.5)
Protein, total: 8.7 g/dL — ABNORMAL HIGH (ref 6.4–8.2)
Sodium: 137 mmol/L (ref 136–145)

## 2015-10-17 LAB — METABOLIC PANEL, BASIC
Anion gap: 9 mmol/L (ref 3.0–18)
BUN/Creatinine ratio: 7 — ABNORMAL LOW (ref 12–20)
BUN: 71 MG/DL — ABNORMAL HIGH (ref 7.0–18)
CO2: 23 mmol/L (ref 21–32)
Calcium: 7.4 MG/DL — ABNORMAL LOW (ref 8.5–10.1)
Chloride: 103 mmol/L (ref 100–108)
Creatinine: 10.1 MG/DL — ABNORMAL HIGH (ref 0.6–1.3)
GFR est AA: 7 mL/min/{1.73_m2} — ABNORMAL LOW (ref >60)
GFR est non-AA: 6 mL/min/{1.73_m2} — ABNORMAL LOW (ref >60)
Glucose: 133 mg/dL — ABNORMAL HIGH (ref 74–99)
Potassium: 5.3 mmol/L (ref 3.5–5.5)
Sodium: 135 mmol/L — ABNORMAL LOW (ref 136–145)

## 2015-10-17 LAB — MAGNESIUM: Magnesium: 2.2 mg/dL (ref 1.8–2.4)

## 2015-10-17 LAB — TYPE & SCREEN
ABO/Rh(D): B POS
ANTIGEN TYPING: POSITIVE
Antibody screen: NEGATIVE

## 2015-10-17 LAB — HEP B SURFACE AG
Hep B surface Ag Interp.: NEGATIVE
Hepatitis B surface Ag: <0.1 Index (ref <1.00)

## 2015-10-17 LAB — NT-PRO BNP: NT pro-BNP: 38759 PG/ML — ABNORMAL HIGH (ref 0–450)

## 2015-10-17 MED ORDER — DIPHENHYDRAMINE HCL 50 MG/ML IJ SOLN
50 mg/mL | Freq: Four times a day (QID) | INTRAMUSCULAR | Status: DC | PRN
Start: 2015-10-17 — End: 2015-10-19
  Administered 2015-10-17 – 2015-10-19 (×8): via INTRAVENOUS

## 2015-10-17 MED ORDER — PROMETHAZINE 25 MG/ML INJECTION
25 mg/mL | INTRAMUSCULAR | Status: AC
Start: 2015-10-17 — End: 2015-10-16
  Administered 2015-10-17: 01:00:00 via INTRAMUSCULAR

## 2015-10-17 MED ORDER — FOLIC ACID 1 MG TAB
1 mg | Freq: Every day | ORAL | Status: DC
Start: 2015-10-17 — End: 2015-11-03
  Administered 2015-10-17 – 2015-11-03 (×14): via ORAL

## 2015-10-17 MED ORDER — EPOETIN ALFA 2,000 UNIT/ML IJ SOLN
2000 unit/mL | Freq: Once | INTRAMUSCULAR | Status: AC
Start: 2015-10-17 — End: 2015-10-19
  Administered 2015-10-20: via INTRAVENOUS

## 2015-10-17 MED ORDER — LISINOPRIL 10 MG TAB
10 mg | Freq: Every day | ORAL | Status: DC
Start: 2015-10-17 — End: 2015-10-23
  Administered 2015-10-17 – 2015-10-23 (×6): via ORAL

## 2015-10-17 MED ORDER — HYDROMORPHONE (PF) 1 MG/ML IJ SOLN
1 mg/mL | Freq: Once | INTRAMUSCULAR | Status: AC
Start: 2015-10-17 — End: 2015-10-16
  Administered 2015-10-17: 01:00:00 via INTRAVENOUS

## 2015-10-17 MED ORDER — HEPARIN (PORCINE) 5,000 UNIT/ML IJ SOLN
5000 unit/mL | Freq: Three times a day (TID) | INTRAMUSCULAR | Status: DC
Start: 2015-10-17 — End: 2015-10-20
  Administered 2015-10-17 – 2015-10-19 (×4): via SUBCUTANEOUS

## 2015-10-17 MED ORDER — ONDANSETRON (PF) 4 MG/2 ML INJECTION
4 mg/2 mL | Freq: Four times a day (QID) | INTRAMUSCULAR | Status: DC | PRN
Start: 2015-10-17 — End: 2015-11-03
  Administered 2015-10-17 – 2015-11-03 (×15): via INTRAVENOUS

## 2015-10-17 MED ORDER — SODIUM CITRATE 4 GRAM/100 ML (4 %) SOLUTION
4 gram /100 mL ( %) | Status: DC
Start: 2015-10-17 — End: 2015-11-03
  Administered 2015-10-27 – 2015-10-28 (×3): via ARTERIOVENOUS_FISTULA

## 2015-10-17 MED ORDER — HYDROXYUREA 500 MG CAPSULE
500 mg | Freq: Every day | ORAL | Status: DC
Start: 2015-10-17 — End: 2015-11-03
  Administered 2015-10-18 – 2015-11-03 (×12): via ORAL

## 2015-10-17 MED ORDER — CALCIUM ACETATE 667 MG CAP
667 mg | Freq: Three times a day (TID) | ORAL | Status: DC
Start: 2015-10-17 — End: 2015-11-03
  Administered 2015-10-17 – 2015-11-03 (×37): via ORAL

## 2015-10-17 MED ORDER — CLONIDINE 0.1 MG TAB
0.1 mg | Freq: Two times a day (BID) | ORAL | Status: DC
Start: 2015-10-17 — End: 2015-10-20
  Administered 2015-10-17 – 2015-10-20 (×5): via ORAL

## 2015-10-17 MED ORDER — HEPARIN (PORCINE) 1,000 UNIT/ML IJ SOLN
1000 unit/mL | INTRAMUSCULAR | Status: AC
Start: 2015-10-17 — End: 2015-10-17
  Administered 2015-10-17: 05:00:00 via ARTERIOVENOUS_FISTULA

## 2015-10-17 MED ORDER — ACETAMINOPHEN 325 MG TABLET
325 mg | ORAL | Status: AC
Start: 2015-10-17 — End: 2015-10-16
  Administered 2015-10-17: 03:00:00 via ORAL

## 2015-10-17 MED ORDER — FAMOTIDINE 20 MG TAB
20 mg | Freq: Every day | ORAL | Status: DC
Start: 2015-10-17 — End: 2015-10-27
  Administered 2015-10-17 – 2015-10-27 (×11): via ORAL

## 2015-10-17 MED ORDER — HYDROMORPHONE (PF) 1 MG/ML IJ SOLN
1 mg/mL | Freq: Once | INTRAMUSCULAR | Status: AC
Start: 2015-10-17 — End: 2015-10-17
  Administered 2015-10-17: 18:00:00 via INTRAVENOUS

## 2015-10-17 MED ORDER — HYDROMORPHONE 0.5 MG/0.5 ML SYRINGE
0.5 mg/ mL | Freq: Once | INTRAMUSCULAR | Status: AC
Start: 2015-10-17 — End: 2015-10-17
  Administered 2015-10-17: 16:00:00 via INTRAVENOUS

## 2015-10-17 MED FILL — CLONIDINE 0.1 MG TAB: 0.1 mg | ORAL | Qty: 1

## 2015-10-17 MED FILL — PROMETHAZINE 25 MG/ML INJECTION: 25 mg/mL | INTRAMUSCULAR | Qty: 1

## 2015-10-17 MED FILL — HEPARIN (PORCINE) 5,000 UNIT/ML IJ SOLN: 5000 unit/mL | INTRAMUSCULAR | Qty: 1

## 2015-10-17 MED FILL — SODIUM CITRATE 4 GRAM/100 ML (4 %) SOLUTION: 4 gram /100 mL ( %) | Qty: 2

## 2015-10-17 MED FILL — CALCIUM ACETATE 667 MG CAP: 667 mg | ORAL | Qty: 1

## 2015-10-17 MED FILL — FAMOTIDINE 20 MG TAB: 20 mg | ORAL | Qty: 1

## 2015-10-17 MED FILL — DIPHENHYDRAMINE HCL 50 MG/ML IJ SOLN: 50 mg/mL | INTRAMUSCULAR | Qty: 1

## 2015-10-17 MED FILL — HYDROMORPHONE (PF) 1 MG/ML IJ SOLN: 1 mg/mL | INTRAMUSCULAR | Qty: 1

## 2015-10-17 MED FILL — FOLIC ACID 1 MG TAB: 1 mg | ORAL | Qty: 1

## 2015-10-17 MED FILL — TYLENOL 325 MG TABLET: 325 mg | ORAL | Qty: 2

## 2015-10-17 MED FILL — LISINOPRIL 10 MG TAB: 10 mg | ORAL | Qty: 1

## 2015-10-17 MED FILL — PROCRIT 3,000 UNIT/ML INJECTION SOLUTION: 3000 unit/mL | INTRAMUSCULAR | Qty: 1

## 2015-10-17 MED FILL — HYDROXYUREA 500 MG CAPSULE: 500 mg | ORAL | Qty: 1

## 2015-10-17 MED FILL — HEPARIN (PORCINE) 1,000 UNIT/ML IJ SOLN: 1000 unit/mL | INTRAMUSCULAR | Qty: 10

## 2015-10-17 MED FILL — HYDROMORPHONE 0.5 MG/0.5 ML SYRINGE: 0.5 mg/ mL | INTRAMUSCULAR | Qty: 1

## 2015-10-17 MED FILL — ONDANSETRON (PF) 4 MG/2 ML INJECTION: 4 mg/2 mL | INTRAMUSCULAR | Qty: 2

## 2015-10-17 NOTE — ED Notes (Signed)
Heparin locked patients hickman catheter on left chest. Pt tolerated well.

## 2015-10-17 NOTE — H&P (View-Only) (Signed)
Surgery Consult      Patient: Tyler Ballard MRN: [id number]  CSN: [id number]      Date of Birth: 12/09/1981    Age: 33 y.o.    Sex: male      DOA: 10/16/2015       HPI:     Tyler Ballard is a 33 y.o. male who presents for hemodialysis.  The patient is currently incarcerated in [place] and was transferred here for treatment.  Per the patient he has not been dialyzed for 2 weeks. He denies shortness of breath or chest pain although he does have some right leg swelling. He also has a history of cancer for which he is receiving chemotherapy via a left chest catheter.  He states his LUE graft has not worked for some time.    Past Medical History   Diagnosis Date   ??? AVN (avascular necrosis of bone) (HCC)      right hip   ??? Chronic kidney disease    ??? Chronic kidney disease (CKD), stage V (HCC)    ??? Dialysis patient (HCC)    ??? ESRD on hemodialysis (HCC)    ??? HIV (human immunodeficiency virus infection) (HCC)    ??? HTN (hypertension) 07/06/2012   ??? HTN (hypertension)    ??? Hypertension    ??? Malingering      Sickle Cell testing negative   ??? Malingering      Patient states has had sickle cell disease since age of 8, hemoglobin electrophoresis at [facility] and [facility] both negative for for sickle cell disease   ??? Noncompliance of patient with renal dialysis (HCC) 09/06/2013   ??? Sickle cell anemia (HCC)      NEGATIVE test. Likely patient states this Dx for secondary gain   ??? Sickle cell disease (HCC)      TOLD BY PATIENT AND NOT TRUE   ??? Stroke (HCC)      2014 pts states he has weakness to right side upper and lower extremities        Past Surgical History   Procedure Laterality Date   ??? Hx orthopaedic  2012     Rt hip decompression   ??? Hx orthopaedic       rt hip    ??? Hx vascular access       av  fistula right arm.   ??? Hx orthopaedic       RIGHT HIP   ??? Hx vascular access       old avg left arm, avf right arm   ??? Hx orthopaedic       right hip surgery   ??? Hx orthopaedic       right hip     ??? Vascular surgery procedure unlist       Dialysis Access       Family History   Problem Relation Age of Onset   ??? Hypertension Mother    ??? Cancer Mother    ??? Sickle Cell Anemia Mother    ??? Hypertension Father    ??? Sickle Cell Anemia Father    ??? Sickle Cell Anemia Sister    ??? Sickle Cell Anemia Other        Social History     Social History   ??? Marital status: SINGLE     Spouse name: N/A   ??? Number of children: N/A   ??? Years of education: N/A     Social History Main Topics   ??? Smoking status:   Former Smoker   ??? Smokeless tobacco: None   ??? Alcohol use No   ??? Drug use: No   ??? Sexual activity: No     Other Topics Concern   ??? None     Social History Narrative    ** Merged History Encounter **         ** Merged History Encounter **         ** Merged History Encounter **         ** Merged History Encounter **         ** Merged History Encounter **            Prior to Admission medications    Medication Sig Start Date End Date Taking? Authorizing Provider   hydroxyurea (HYDREA) 500 mg capsule Take 500 mg by mouth daily.   Yes Phys Other, MD   folic acid (FOLVITE) 1 mg tablet Take 1 mg by mouth daily.   Yes Phys Other, MD   cloNIDine HCl (CATAPRES) 0.1 mg tablet Take 1 tablet by mouth two (2) times a day. 11/04/13  Yes Vishwas J Patel, MD   lisinopril (PRINIVIL, ZESTRIL) 10 mg tablet Take 1 tablet by mouth daily. 11/06/13  Yes Vishwas J Patel, MD   famotidine (PEPCID) 20 mg tablet Take 1 tablet by mouth daily. 11/04/13   Vishwas J Patel, MD   epoetin alfa (EPOGEN) 10,000 unit/mL injection by SubCUTAneous route once.   Yes Phys Other, MD   calcium acetate (PHOSLO) 667 mg cap Take  by mouth three (3) times daily (with meals).   Yes Phys Other, MD       No Known Allergies    Physical Exam:      Visit Vitals   ??? BP 164/79 (BP 1 Location: Right arm, BP Patient Position: At rest)   ??? Pulse 79   ??? Temp 98 ??F (36.7 ??C)   ??? Resp 20   ??? Ht 5' 7" (1.702 m)   ??? Wt 219 lb 9.3 oz (99.6 kg)   ??? SpO2 99%   ??? BMI 34.39 kg/m2        GENERAL: alert, cooperative, no distress, appears stated age, EYE: negative findings: anicteric sclera, LYMPHATIC: Cervical, supraclavicular, and axillary nodes normal. , THROAT & NECK: normal, LUNG: clear to auscultation bilaterally, HEART: regular rate and rhythm, ABDOMEN: soft, non-tender. Bowel sounds normal. No masses,  no organomegaly, EXTREMITIES:  edema 2+ RLE.  Multiple incisions on right leg.    ROS:  Constitutional: negative  Eyes: negative  Ears, nose, mouth, throat, and face: negative  Respiratory: negative  Cardiovascular: negative  Gastrointestinal: negative  Genitourinary:negative  Neurological: negative  Unless otherwise mentioned in the HPI.    Data Review:    CBC:   Lab Results   Component Value Date/Time    WBC 3.1 10/16/2015 08:51 PM    RBC 2.99 10/16/2015 08:51 PM    HGB 9.0 10/16/2015 08:51 PM    HCT 28.0 10/16/2015 08:51 PM    PLATELET 91 10/16/2015 08:51 PM      BMP:   Lab Results   Component Value Date/Time    GLUCOSE 133 10/16/2015 08:51 PM    SODIUM 135 10/16/2015 08:51 PM    POTASSIUM 5.3 10/16/2015 08:51 PM    CHLORIDE 103 10/16/2015 08:51 PM    CO2 23 10/16/2015 08:51 PM    BUN 71 10/16/2015 08:51 PM    CREATININE 10.10 10/16/2015 08:51 PM    CALCIUM 7.4 10/16/2015 08:51   PM     Coagulation:   Lab Results   Component Value Date/Time    PROTHROMBIN TIME 15.6 08/03/2013 04:35 AM    INR 1.2 08/03/2013 04:35 AM    APTT 32.6 08/03/2013 04:35 AM    APTT 29.7 07/12/2013 04:01 AM         Assessment/Plan     33 M with ESRD in need of HD access  1) Attempted to place a left groin HD catheter without success.  VIA ultrasound the vein was not fully compressible.  Chronic clot?  Will attempt IJ access tomorrow in OR  2) Per previous notes, the patient has an occluded RIJ, but it appears patent on bedside duplex.  If we are not able to access the RIJ, will attempt via the groin.   3) Patient is aware and understands the plan.     Active Problems:     ESRD (end stage renal disease) on dialysis (HCC) (07/06/2012)      HTN (hypertension) (07/06/2012)      Renal failure (10/16/2015)      HIV (human immunodeficiency virus infection) (HCC) (10/17/2015)        Jalen Daluz L Dylan Monforte, MD  October 17, 2015

## 2015-10-17 NOTE — Consults (Addendum)
[facility]                               [address] [place], [place] [address]                                CONSULTATION REPORT    PATIENT:    Tyler Ballard, Tyler Ballard  MRN:            [id number] ADMIT DATE:   10/16/2015  BILLING:        [id number] CONSULTED:    10/17/2015  ROOM:       [id number]  ATTENDING:  Wanda Plump, MD  DICTATING:  Timothy Lasso, MD        RENAL CONSULTATION    REQUESTING PHYSICIAN: The emergency room physician, as well as Dr. Wanda Plump.    REASON FOR CONSULTATION: Need for dialysis in a dialysis patient who did  not receive dialysis for almost 2 weeks.    HISTORY OF PRESENT ILLNESS: This 34 year old African American male, known  to me from previous encounter as well as incarcerations in the past, was  brought to the emergency room from the correctional center. The patient was  sent to the emergency room from the [facility] for further  management. He has long-term history of ESRD secondary to hypertension as  well as sickle cell disease. He also has a history of HIV. What he says is  that he has been transferred from [place] to here and he did not get any  dialysis for the last 2 weeks. He has a graft on the left forearm, which  has been clotted. Also, we found by examining him that he has a tunneled  dialysis catheter on the left IJ, but he claims that is not for dialysis  and is given his history of pancreatic cancer, which I could not document  anywhere that he received chemotherapy and he will receive chemotherapy 3  times a week by some of the nurse practitioners. When he had a diagnosis of  pancreatic cancer is not clear. He has history of ESRD, history of  hypertension, sickle cell disease and HIV as well as obesity related  problem. He had multiple surgeries for AV fistula and AV graft on different  arms and had sepsis, catheter-related, and multiple catheter insertions for   dialysis. Again, I am not sure when he had the diagnosis of pancreatic  cancer and who is treating that one. I did not ot see any kind of  chemotherapy in the list of medication that he is getting. At this time, he  denies any shortness of breath, any palpitation, but feels tired. No  nausea, no vomiting. He still possibly makes little urine.    PAST MEDICAL HISTORY: Includes avascular necrosis of bone or AVN of the  right hip, ESRD. HIV, not sure what is the last CD4 count or viral load,  history of hypertension, malingering, sickle cell testing was negative.  Noncompliance. Also has a stroke.    PAST SURGICAL HISTORY: Includes a right hip decompression, left hip  decompression, vascular access, right hip surgery.    FAMILY HISTORY: Significant for hypertension, cancer, sickle cell anemia.    SOCIAL  HISTORY: Used to smoke. No alcohol-related problem, drug history is  not clear.    LIST OF MEDICATIONS: Before admission are as follows  1. Hydroxyurea 500 mg capsule daily.  2. Folic acid 1 mg capsule daily.  3. Catapres 0.1 mg 3 times daily.  4. Lisinopril 10 mg daily.  5. Pepcid 20 mg daily.  6. Epogen 10,000 units subcutaneous 3 times a week.  7. Calcium acetate or PhosLo 3 tablets 3 times daily.    ALLERGIES: NO KNOWN DRUG ALLERGIES.    REVIEW OF SYSTEMS  A comprehensive review of system was negative except for that is written in  the history of present illness. He denies any chest pain, any shortness of  breath, any palpitation, any hemoptysis, any hematemesis, any melena, any  headache or any weakness.    PHYSICAL EXAMINATION  VITAL SIGNS: Temperature of 97.7, heart rate 87/minute, blood pressure  135/68, respirations 17-20. Oxygen saturation 99% to 100% at room air. His  weight is 97.7 kilograms, today is 99 kilograms.  HEENT: Oral mucosa moist.  NECK: Jugular venous pressure slightly distended. Neck is supple.  LUNGS: Decreased breath sounds without any obvious crackles or rhonchi.   HEART: S1, S2, without any gallop or murmur. Has a left IJ tunneled  dialysis catheter, which he claims is used for his chemo, not for dialysis.  Also, has an AV graft on the left forearm that has been clotted.  ABDOMEN: Obese, could not appreciate any organomegaly.  EXTREMITIES: Ankle 2+ edema.    RELEVANT LABS: WBC on admission was 3.1 with a hemoglobin of 9.0,  hematocrit of 28.0, platelets of 91,000 with neutrophils 57%, lymphocytes  of 23%, monocytes 14%. Sodium 135, potassium 5.3, chloride 103, CO2 of 23,  anion gap of 9, glucose of 133, blood urea nitrogen of 71, creatinine of  10.10, calcium 7.4, magnesium 2.2. His BNP is 38,759. Echocardiogram  ordered, which was not done yet. Chest x-ray with mild cardiac enlargement,  otherwise lungs clear. Dialysis catheter enters in the left-sided approach  and terminates near the junction of the superior vena cava and the right  atrium. There is no effusion by chest x-ray. His last HIV tested a  few years back was positive. No EKG was done on this admission. Last EKG  was done in October of year 2014, which shows normal sinus rhythm,  prolonged QT interval, abnormal EKG.    IMPRESSION AND PLAN  1. End-stage renal disease.  2. Clotted arteriovenous graft.  3. Hypertension.  4. Obesity.  5. History of sickle cell disease versus anemia.  6. Questionable history of pancreatic cancer as per the patient, without  any documentation. I am not sure exactly since when this was diagnosed. By  reviewing the records, he has several times given different kind of history  and tendency to give misinformation about his medical problem.    PLAN: As this patient did not get any dialysis for the last 7-8 days and  has mild signs of volume overload, would arrange dialysis today, but before  that we have to arrange a vascular access by a temporary dialysis catheter  for which I have already called the vascular surgeon on call. After getting   access, we will dialyze him today and then again on Monday and Tuesday and  we need to the declot that graft or put in a dialysis catheter. We have to  confirm whether he has any definitive pancreatic cancer or not, and I  strongly recommend that Oncology  should be involved in determining whether  this patient has given the right history of pancreatic cancer or not. We  will implement renal diet and we will restart his phosphorus binder,  monitor his PTH and we will check the relevant labs also. Further  recommendations will be given based on the hospital course.                     Timothy LassoMOSTA G. Sita Mangen, MD    [facility]  D: 10/17/2015 T: 10/17/2015 01:40 P  Job: [id number]  CScriptDoc #: [id number]  cc:   Wanda PlumpIRGHAYU DESAI, MD        Timothy LassoMOSTA G. Jeneal Vogl, MD

## 2015-10-17 NOTE — Progress Notes (Signed)
ESRD patient, transferred from [place] to [facility] yesterday. Did not get any dialysis for  More than 1 week, AVG is clotted for more than 1 week, has (L) IJ  TDC  But that is used for his Chemo theraphy for Pancreatic Cancer & he will need for chemo.as per him.   Needs to have Temporary HD catheter today to initiae dialysis today, will need thrombectomy or TDC afterwards, recommend Oncology consult regarding his  ? Pancreatic cancer. Will dialyze today once dialysis access is done, discussed with vascular Surgeon on call & admitting team.

## 2015-10-17 NOTE — Other (Signed)
TRANSFER - OUT REPORT:    Verbal report given to Rulon EisenmengerFelix, dialysis RN(name) on Cal[employer] CorporationElbert C Salvato  being transferred to Dialysis(unit) for ordered procedure       Report consisted of patient???s Situation, Background, Assessment and   Recommendations(SBAR).     Information from the following report(s) SBAR, Kardex and MAR was reviewed with the receiving nurse.    Lines:   Double Lumen Hickman 09/04/13 Left Other(comment) (Active)   Central Line Being Utilized Yes 10/16/2015  8:20 PM   Site Assessment Clean, dry, & intact 10/16/2015  8:20 PM   Dressing Status Clean, dry, & intact 10/16/2015  8:20 PM       Peripheral IV 10/17/15 Right Forearm (Active)   Site Assessment Clean, dry, & intact 10/17/2015 12:44 AM   Phlebitis Assessment 0 10/17/2015 12:44 AM   Infiltration Assessment 0 10/17/2015 12:44 AM   Dressing Status Clean, dry, & intact 10/17/2015 12:44 AM   Dressing Type Transparent 10/17/2015 12:44 AM   Hub Color/Line Status Pink;Flushed 10/17/2015 12:44 AM        Opportunity for questions and clarification was provided.      Patient in testing procedure at this time, dialysis will try and communicate with echo for transportation from them to dialysis.

## 2015-10-17 NOTE — ED Notes (Signed)
Hourly rounding complete.   Safety  Pt resting   [x  ]  On stretcher with side rails up and bed in locked position, call bell within reach  [  ]  Sitting in chair with casters locked, call bell within reach    Toileting  [x  ] pt denies need to use bathroom  [  ] pt assisted to bathroom  [  ] pt assisted with bedpan  [  ] pt independent to bathroom as needed    Ongoing Plan of Care  Plan of care and expected time for test and results reviewed with pt.    Pain Management / Comfort  [  ] dimmed lights  [  ] warm blanket provided  [  ] pain assessed  [ x ] monitor alarms reviewed    Jail attendants bedside.

## 2015-10-17 NOTE — Progress Notes (Signed)
Received  Awake and alert from ER assis into rm 360. Pt has officer at bedside. Pt is an inmate. In no acute distress. Awaiting orders . Notified Dr Celine Mansesai of pt`s location. Vital signs stable. Tele#8=SR without ectopics

## 2015-10-17 NOTE — Consults (Signed)
Surgery Consult      Patient: Tyler Ballard MRN: [id number]  CSN: [id number]      Date of Birth: 1981-03-04    Age: 34 y.o.    Sex: male      DOA: 10/16/2015       HPI:     Tyler Ballard is a 34 y.o. male who presents for hemodialysis.  The patient is currently incarcerated in [place] and was transferred here for treatment.  Per the patient he has not been dialyzed for 2 weeks. He denies shortness of breath or chest pain although he does have some right leg swelling. He also has a history of cancer for which he is receiving chemotherapy via a left chest catheter.  He states his LUE graft has not worked for some time.    Past Medical History   Diagnosis Date   ??? AVN (avascular necrosis of bone) (HCC)      right hip   ??? Chronic kidney disease    ??? Chronic kidney disease (CKD), stage V (HCC)    ??? Dialysis patient [facility])    ??? ESRD on hemodialysis (HCC)    ??? HIV (human immunodeficiency virus infection) (HCC)    ??? HTN (hypertension) 07/06/2012   ??? HTN (hypertension)    ??? Hypertension    ??? Malingering      Sickle Cell testing negative   ??? Malingering      Patient states has had sickle cell disease since age of 22, hemoglobin electrophoresis at [facility] hospital and [facility] both negative for for sickle cell disease   ??? Noncompliance of patient with renal dialysis (HCC) 09/06/2013   ??? Sickle cell anemia (HCC)      NEGATIVE test. Likely patient states this Dx for secondary gain   ??? Sickle cell disease (HCC)      TOLD BY PATIENT AND NOT TRUE   ??? Stroke (HCC)      2014 pts states he has weakness to right side upper and lower extremities        Past Surgical History   Procedure Laterality Date   ??? Hx orthopaedic  2012     Rt hip decompression   ??? Hx orthopaedic       rt hip    ??? Hx vascular access       av  fistula right arm.   ??? Hx orthopaedic       RIGHT HIP   ??? Hx vascular access       old avg left arm, avf right arm   ??? Hx orthopaedic       right hip surgery   ??? Hx orthopaedic       right hip     ??? Vascular surgery procedure unlist       Dialysis Access       Family History   Problem Relation Age of Onset   ??? Hypertension Mother    ??? Cancer Mother    ??? Sickle Cell Anemia Mother    ??? Hypertension Father    ??? Sickle Cell Anemia Father    ??? Sickle Cell Anemia Sister    ??? Sickle Cell Anemia Other        Social History     Social History   ??? Marital status: SINGLE     Spouse name: N/A   ??? Number of children: N/A   ??? Years of education: N/A     Social History Main Topics   ??? Smoking status:  Former Smoker   ??? Smokeless tobacco: None   ??? Alcohol use No   ??? Drug use: No   ??? Sexual activity: No     Other Topics Concern   ??? None     Social History Narrative    ** Merged History Encounter **         ** Merged History Encounter **         ** Merged History Encounter **         ** Merged History Encounter **         ** Merged History Encounter **            Prior to Admission medications    Medication Sig Start Date End Date Taking? Authorizing Provider   hydroxyurea (HYDREA) 500 mg capsule Take 500 mg by mouth daily.   Yes Phys Other, MD   folic acid (FOLVITE) 1 mg tablet Take 1 mg by mouth daily.   Yes Phys Other, MD   cloNIDine HCl (CATAPRES) 0.1 mg tablet Take 1 tablet by mouth two (2) times a day. 11/04/13  Yes Vishwas Kathe MarinerJ Patel, MD   lisinopril (PRINIVIL, ZESTRIL) 10 mg tablet Take 1 tablet by mouth daily. 11/06/13  Yes Vishwas Kathe MarinerJ Patel, MD   famotidine (PEPCID) 20 mg tablet Take 1 tablet by mouth daily. 11/04/13   Vishwas Kathe MarinerJ Patel, MD   epoetin alfa (EPOGEN) 10,000 unit/mL injection by SubCUTAneous route once.   Yes Phys Other, MD   calcium acetate (PHOSLO) 667 mg cap Take  by mouth three (3) times daily (with meals).   Yes Phys Other, MD       No Known Allergies    Physical Exam:      Visit Vitals   ??? BP 164/79 (BP 1 Location: Right arm, BP Patient Position: At rest)   ??? Pulse 79   ??? Temp 98 ??F (36.7 ??C)   ??? Resp 20   ??? Ht 5\' 7"  (1.702 m)   ??? Wt 219 lb 9.3 oz (99.6 kg)   ??? SpO2 99%   ??? BMI 34.39 kg/m2        GENERAL: alert, cooperative, no distress, appears stated age, EYE: negative findings: anicteric sclera, LYMPHATIC: Cervical, supraclavicular, and axillary nodes normal. , THROAT & NECK: normal, LUNG: clear to auscultation bilaterally, HEART: regular rate and rhythm, ABDOMEN: soft, non-tender. Bowel sounds normal. No masses,  no organomegaly, EXTREMITIES:  edema 2+ RLE.  Multiple incisions on right leg.    ROS:  Constitutional: negative  Eyes: negative  Ears, nose, mouth, throat, and face: negative  Respiratory: negative  Cardiovascular: negative  Gastrointestinal: negative  Genitourinary:negative  Neurological: negative  Unless otherwise mentioned in the HPI.    Data Review:    CBC:   Lab Results   Component Value Date/Time    WBC 3.1 10/16/2015 08:51 PM    RBC 2.99 10/16/2015 08:51 PM    HGB 9.0 10/16/2015 08:51 PM    HCT 28.0 10/16/2015 08:51 PM    PLATELET 91 10/16/2015 08:51 PM      BMP:   Lab Results   Component Value Date/Time    GLUCOSE 133 10/16/2015 08:51 PM    SODIUM 135 10/16/2015 08:51 PM    POTASSIUM 5.3 10/16/2015 08:51 PM    CHLORIDE 103 10/16/2015 08:51 PM    CO2 23 10/16/2015 08:51 PM    BUN 71 10/16/2015 08:51 PM    CREATININE 10.10 10/16/2015 08:51 PM    CALCIUM 7.4 10/16/2015 08:51  PM     Coagulation:   Lab Results   Component Value Date/Time    PROTHROMBIN TIME 15.6 08/03/2013 04:35 AM    INR 1.2 08/03/2013 04:35 AM    APTT 32.6 08/03/2013 04:35 AM    APTT 29.7 07/12/2013 04:01 AM         Assessment/Plan     33 M with ESRD in need of HD access  1) Attempted to place a left groin HD catheter without success.  VIA ultrasound the vein was not fully compressible.  Chronic clot?  Will attempt IJ access tomorrow in OR  2) Per previous notes, the patient has an occluded RIJ, but it appears patent on bedside duplex.  If we are not able to access the RIJ, will attempt via the groin.   3) Patient is aware and understands the plan.     Active Problems:     ESRD (end stage renal disease) on dialysis (HCC) (07/06/2012)      HTN (hypertension) (07/06/2012)      Renal failure (10/16/2015)      HIV (human immunodeficiency virus infection) (HCC) (10/17/2015)        Ouida Sills, MD  October 17, 2015

## 2015-10-17 NOTE — Progress Notes (Signed)
Hospitalist Progress Note    Patient: Tyler Ballard MRN: [id number]  CSN: [id number]    Date of Birth: 1980/12/27  Age: 34 y.o.  Sex: male    DOA: 10/16/2015 LOS:  LOS: 1 day          States his last HD was 2 weeks ago. Denies pain anywhere. States he is in jail for parole violation, arrested in [place] when he went to visit family.     Has not been receiving his HIV combo pill in jail.     Assessment/Plan     1. HD access AV fistula is not functioning - dialysis nurse and Dr. Ardelle Park to determine if catheter functioning properly  2. ESRD / HD per nephrology.   3. Sickle cell anemia with history of acute chest syndrome - has received BT in the past.   4. Hypertension on ACE and catapres. Await HD. Marland Kitchen  5. Noncompliance w/ HD related to incarceration  6. Obesity Body mass index is 34.39 kg/(m^2).  7. pancytopenia in the setting of HIV  8. Elevated alkaline phosphatase  9. HIV - needs referral to HIV clinic. Follow CD4 count. Check viral load. Will call ID consult Monday.   10. DM2 - ADA diet when appropriate, ssi, check A1c.   11. Left AMA [facility] 2013, reports of drug seeking behavior during that admission  12. Anemia of ckd per renal  13. Secondary hyperparathyroidism of ckd per renal  14. Obesity Body mass index is 35.29 kg/(m^2).  15. dvt prophylaxis   16. Full code. Tele. I discussed the case with Dr. Ardelle Park.     Additional Notes:      Case discussed with:  Patient  Family  Nursing  Case Management  DVT Prophylaxis:  Lovenox  Hep SQ  SCDs  Coumadin   On Heparin gtt    Vital signs/Intake and Output:  Visit Vitals   ??? BP 164/79 (BP 1 Location: Right arm, BP Patient Position: At rest)   ??? Pulse 79   ??? Temp 98 ??F (36.7 ??C)   ??? Resp 20   ??? Ht  (1.702 m)   ??? Wt 99.6 kg (219 lb 9.3 oz)   ??? SpO2 99%   ??? BMI 34.39 kg/m2     Current Shift:  10/29 0701 - 10/29 1900  In: 240 [P.O.:240]  Out: -   Last three shifts:  10/27 1901 - 10/29 0700  In: 360 [P.O.:360]  Out: 0      Awake alert and oriented. Shackled to bed  Ncat. Perrl. Poor dentition  Left IJ TDC.   RRR  cta b.l  Soft nt nd nabs.  AVG.   No focal deficit  No rash    Medications Reviewed      Labs: Results:       Chemistry Recent Labs      10/16/15   2051   GLU  133*   NA  135*   K  5.3   CL  103   CO2  23   BUN  71*   CREA  10.10*   CA  7.4*   AGAP  9   BUCR  7*      CBC w/Diff Recent Labs      10/16/15   2051   WBC  3.1*   RBC  2.99*   HGB  9.0*   HCT  28.0*   PLT  91*   GRANS  57   LYMPH  23   EOS  6*      Cardiac Enzymes No results for input(s): CPK, CKND1, MYO in the last 72 hours.    No lab exists for component: CKRMB, TROIP   Coagulation No results for input(s): PTP, INR, APTT in the last 72 hours.    No lab exists for component: INREXT    Lipid Panel Lab Results   Component Value Date/Time    CHOLESTEROL, TOTAL 85 11/29/2012 09:36 AM    HDL CHOLESTEROL 33 11/29/2012 09:36 AM    LDL, CALCULATED 41.2 11/29/2012 09:36 AM    VLDL, CALCULATED 10.8 11/29/2012 09:36 AM    TRIGLYCERIDE 54 11/29/2012 09:36 AM    CHOL/HDL RATIO 2.6 11/29/2012 09:36 AM      BNP No results for input(s): BNPP in the last 72 hours.   Liver Enzymes No results for input(s): TP, ALB, TBIL, AP, SGOT, GPT in the last 72 hours.    No lab exists for component: DBIL   Thyroid Studies No results found for: T4, T3U, TSH, TSHEXT     Procedures/imaging: see electronic medical records for all procedures/Xrays and details which were not copied into this note but were reviewed prior to creation of Plan.

## 2015-10-17 NOTE — Other (Signed)
Bedside and Verbal shift change report given to AMY RN (oncoming nurse) by LINDA RN (offgoing nurse). Report included the following information SBAR, Kardex and MAR.

## 2015-10-17 NOTE — Other (Signed)
TRANSFER - OUT REPORT:    Verbal report given to Tyler QuinLinda, RN (name) on [name]  being transferred to 360 (unit) for routine progression of care       Report consisted of patient???s Situation, Background, Assessment and   Recommendations(SBAR).     Information from the following report(s) SBAR, ED Summary, Intake/Output, MAR, Recent Results and Cardiac Rhythm NSR was reviewed with the receiving nurse.    Lines:   Double Lumen Hickman 09/04/13 Left Other(comment) (Active)   Central Line Being Utilized Yes 10/16/2015  8:20 PM   Site Assessment Clean, dry, & intact 10/16/2015  8:20 PM   Dressing Status Clean, dry, & intact 10/16/2015  8:20 PM        Opportunity for questions and clarification was provided.      Patient transported with:   Publishing copyMonitor  Registered Nurse   Security  2 jail attendants

## 2015-10-17 NOTE — Progress Notes (Signed)
Received in bed awake and alert. In no acute distress.denies discomfort. Vital signs stable.tele#8=SR without ectopics. Officer on duty at bedside

## 2015-10-17 NOTE — Other (Addendum)
Bedside shift change report given to Kynadee Dam Lanae Crumbly Wyllow Seigler, RN (oncoming nurse) by Truddie HiddenLinda Tole, RN (offgoing nurse). Report included the following information SBAR, Kardex and MAR.     1542 - Blood bank telephoned, RN verified with patient, patient is sickle cell anemia.  In history, comments by sickle cell states patient is not.  Per patient positive for sickle cell.    1912 - Bedside shift change report given to Truddie HiddenLinda Tole, RN (oncoming nurse) by Legrand PittsAmy Henchy Mccauley, RN (offgoing nurse). Report included the following information SBAR, Kardex and MAR.

## 2015-10-18 ENCOUNTER — Inpatient Hospital Stay: Admit: 2015-10-18 | Payer: PRIVATE HEALTH INSURANCE | Primary: Family Medicine

## 2015-10-18 LAB — EKG, 12 LEAD, INITIAL
Atrial Rate: 82 {beats}/min
Calculated P Axis: 49 degrees
Calculated R Axis: 28 degrees
Calculated T Axis: 6 degrees
Diagnosis: NORMAL
P-R Interval: 188 ms
Q-T Interval: 424 ms
QRS Duration: 76 ms
QTC Calculation (Bezet): 495 ms
Ventricular Rate: 82 {beats}/min

## 2015-10-18 LAB — GLUCOSE, POC
Glucose (POC): 68 mg/dL — ABNORMAL LOW (ref 70–110)
Glucose (POC): 77 mg/dL (ref 70–110)

## 2015-10-18 LAB — PROTHROMBIN TIME + INR
INR: 1.3 — ABNORMAL HIGH (ref 0.8–1.2)
Prothrombin time: 15.4 s — ABNORMAL HIGH (ref 11.5–15.2)

## 2015-10-18 MED ORDER — GLUCOSE 4 GRAM CHEWABLE TAB
4 gram | ORAL | Status: DC | PRN
Start: 2015-10-18 — End: 2015-10-18

## 2015-10-18 MED ORDER — FENTANYL CITRATE (PF) 50 MCG/ML IJ SOLN
50 mcg/mL | INTRAMUSCULAR | Status: DC | PRN
Start: 2015-10-18 — End: 2015-10-18
  Administered 2015-10-18 (×2): via INTRAVENOUS

## 2015-10-18 MED ORDER — INSULIN LISPRO 100 UNIT/ML INJECTION
100 unit/mL | Freq: Four times a day (QID) | SUBCUTANEOUS | Status: DC
Start: 2015-10-18 — End: 2015-10-18

## 2015-10-18 MED ORDER — ACETAMINOPHEN 500 MG TAB
500 mg | Freq: Four times a day (QID) | ORAL | Status: DC | PRN
Start: 2015-10-18 — End: 2015-10-28

## 2015-10-18 MED ORDER — LACTATED RINGERS IV
INTRAVENOUS | Status: DC
Start: 2015-10-18 — End: 2015-10-18

## 2015-10-18 MED ORDER — FENTANYL CITRATE (PF) 50 MCG/ML IJ SOLN
50 mcg/mL | INTRAMUSCULAR | Status: AC
Start: 2015-10-18 — End: ?

## 2015-10-18 MED ORDER — FENTANYL CITRATE (PF) 50 MCG/ML IJ SOLN
50 mcg/mL | INTRAMUSCULAR | Status: DC | PRN
Start: 2015-10-18 — End: 2015-10-18
  Administered 2015-10-18 (×2): via INTRAVENOUS

## 2015-10-18 MED ORDER — INSULIN LISPRO 100 UNIT/ML INJECTION
100 unit/mL | Freq: Once | SUBCUTANEOUS | Status: AC
Start: 2015-10-18 — End: 2015-10-18

## 2015-10-18 MED ORDER — NALOXONE 0.4 MG/ML INJECTION
0.4 mg/mL | INTRAMUSCULAR | Status: DC | PRN
Start: 2015-10-18 — End: 2015-11-03

## 2015-10-18 MED ORDER — HEPARIN (PORCINE) 5,000 UNIT/ML IJ SOLN
5000 unit/mL | INTRAMUSCULAR | Status: AC
Start: 2015-10-18 — End: 2015-10-18

## 2015-10-18 MED ORDER — LIDOCAINE (PF) 10 MG/ML (1 %) IJ SOLN
10 mg/mL (1 %) | INTRAMUSCULAR | Status: AC
Start: 2015-10-18 — End: 2015-10-18

## 2015-10-18 MED ORDER — LIDOCAINE (PF) 10 MG/ML (1 %) IJ SOLN
10 mg/mL (1 %) | INTRAMUSCULAR | Status: DC | PRN
Start: 2015-10-18 — End: 2015-10-18
  Administered 2015-10-18: 14:00:00 via SUBCUTANEOUS

## 2015-10-18 MED ORDER — ONDANSETRON (PF) 4 MG/2 ML INJECTION
4 mg/2 mL | Freq: Once | INTRAMUSCULAR | Status: DC
Start: 2015-10-18 — End: 2015-10-18

## 2015-10-18 MED ORDER — DEXTROSE 50% IN WATER (D50W) IV SYRG
INTRAVENOUS | Status: DC | PRN
Start: 2015-10-18 — End: 2015-10-28

## 2015-10-18 MED ORDER — SODIUM CHLORIDE 0.9 % IV
INTRAVENOUS | Status: DC | PRN
Start: 2015-10-18 — End: 2015-10-18
  Administered 2015-10-18: 14:00:00 via INTRAVENOUS

## 2015-10-18 MED ORDER — PROMETHAZINE 25 MG/ML INJECTION
25 mg/mL | Freq: Four times a day (QID) | INTRAMUSCULAR | Status: AC | PRN
Start: 2015-10-18 — End: 2015-10-18

## 2015-10-18 MED ORDER — GLUCOSE 4 GRAM CHEWABLE TAB
4 gram | ORAL | Status: DC | PRN
Start: 2015-10-18 — End: 2015-11-03

## 2015-10-18 MED ORDER — HYDROMORPHONE (PF) 2 MG/ML IJ SOLN
2 mg/mL | INTRAMUSCULAR | Status: AC
Start: 2015-10-18 — End: 2015-10-18
  Administered 2015-10-18: 15:00:00 via INTRAVENOUS

## 2015-10-18 MED ORDER — PROPOFOL INFUSION
INTRAVENOUS | Status: DC | PRN
Start: 2015-10-18 — End: 2015-10-18
  Administered 2015-10-18 (×2): via INTRAVENOUS

## 2015-10-18 MED ORDER — SODIUM CHLORIDE 0.9 % IJ SYRG
INTRAMUSCULAR | Status: DC | PRN
Start: 2015-10-18 — End: 2015-11-03

## 2015-10-18 MED ORDER — LIDOCAINE (PF) 20 MG/ML (2 %) IJ SOLN
20 mg/mL (2 %) | INTRAMUSCULAR | Status: DC | PRN
Start: 2015-10-18 — End: 2015-10-18
  Administered 2015-10-18: 14:00:00 via INTRAVENOUS

## 2015-10-18 MED ORDER — MIDAZOLAM 1 MG/ML IJ SOLN
1 mg/mL | INTRAMUSCULAR | Status: DC | PRN
Start: 2015-10-18 — End: 2015-10-18
  Administered 2015-10-18: 14:00:00 via INTRAVENOUS

## 2015-10-18 MED ORDER — DIPHENHYDRAMINE HCL 50 MG/ML IJ SOLN
50 mg/mL | INTRAMUSCULAR | Status: DC | PRN
Start: 2015-10-18 — End: 2015-10-18
  Administered 2015-10-18: 15:00:00 via INTRAVENOUS

## 2015-10-18 MED ORDER — PROMETHAZINE 25 MG/ML INJECTION
25 mg/mL | INTRAMUSCULAR | Status: AC
Start: 2015-10-18 — End: 2015-10-18
  Administered 2015-10-18: 15:00:00 via INTRAMUSCULAR

## 2015-10-18 MED ORDER — HEPARIN (PORCINE) 5,000 UNIT/ML IJ SOLN
5000 unit/mL | INTRAMUSCULAR | Status: DC | PRN
Start: 2015-10-18 — End: 2015-10-18
  Administered 2015-10-18: 14:00:00

## 2015-10-18 MED ORDER — DEXTROSE 50% IN WATER (D50W) IV SYRG
INTRAVENOUS | Status: DC | PRN
Start: 2015-10-18 — End: 2015-10-18
  Administered 2015-10-18: 11:00:00 via INTRAVENOUS

## 2015-10-18 MED ORDER — MIDAZOLAM 1 MG/ML IJ SOLN
1 mg/mL | INTRAMUSCULAR | Status: AC
Start: 2015-10-18 — End: ?

## 2015-10-18 MED ORDER — HEPARIN (PORCINE) IN NS (PF) 1,000 UNIT/500 ML IV
1000 unit/500 mL | INTRAVENOUS | Status: AC
Start: 2015-10-18 — End: 2015-10-18

## 2015-10-18 MED ORDER — GLUCAGON 1 MG INJECTION
1 mg | INTRAMUSCULAR | Status: DC | PRN
Start: 2015-10-18 — End: 2015-10-18

## 2015-10-18 MED ORDER — SODIUM CITRATE 4 GRAM/100 ML (4 %) SOLUTION
4 gram /100 mL ( %) | Status: DC | PRN
Start: 2015-10-18 — End: 2015-11-03

## 2015-10-18 MED ORDER — PROPOFOL 10 MG/ML IV EMUL
10 mg/mL | INTRAVENOUS | Status: AC
Start: 2015-10-18 — End: ?

## 2015-10-18 MED ORDER — GLUCAGON 1 MG INJECTION
1 mg | INTRAMUSCULAR | Status: DC | PRN
Start: 2015-10-18 — End: 2015-11-03

## 2015-10-18 MED ORDER — HYDROMORPHONE (PF) 2 MG/ML IJ SOLN
2 mg/mL | INTRAMUSCULAR | Status: AC | PRN
Start: 2015-10-18 — End: 2015-10-18
  Administered 2015-10-18 (×3): via INTRAVENOUS

## 2015-10-18 MED FILL — LIDOCAINE (PF) 20 MG/ML (2 %) IJ SOLN: 20 mg/mL (2 %) | INTRAMUSCULAR | Qty: 5

## 2015-10-18 MED FILL — LACTATED RINGERS IV: INTRAVENOUS | Qty: 1000

## 2015-10-18 MED FILL — PROMETHAZINE 25 MG/ML INJECTION: 25 mg/mL | INTRAMUSCULAR | Qty: 1

## 2015-10-18 MED FILL — DIPHENHYDRAMINE HCL 50 MG/ML IJ SOLN: 50 mg/mL | INTRAMUSCULAR | Qty: 1

## 2015-10-18 MED FILL — HEPARIN (PORCINE) IN NS (PF) 1,000 UNIT/500 ML IV: 1000 unit/500 mL | INTRAVENOUS | Qty: 500

## 2015-10-18 MED FILL — LISINOPRIL 10 MG TAB: 10 mg | ORAL | Qty: 1

## 2015-10-18 MED FILL — LIDOCAINE (PF) 10 MG/ML (1 %) IJ SOLN: 10 mg/mL (1 %) | INTRAMUSCULAR | Qty: 30

## 2015-10-18 MED FILL — HEPARIN (PORCINE) 5,000 UNIT/ML IJ SOLN: 5000 unit/mL | INTRAMUSCULAR | Qty: 1

## 2015-10-18 MED FILL — HYDROXYUREA 500 MG CAPSULE: 500 mg | ORAL | Qty: 1

## 2015-10-18 MED FILL — FENTANYL CITRATE (PF) 50 MCG/ML IJ SOLN: 50 mcg/mL | INTRAMUSCULAR | Qty: 2

## 2015-10-18 MED FILL — DIPRIVAN 10 MG/ML INTRAVENOUS EMULSION: 10 mg/mL | INTRAVENOUS | Qty: 20

## 2015-10-18 MED FILL — CALCIUM ACETATE 667 MG CAP: 667 mg | ORAL | Qty: 1

## 2015-10-18 MED FILL — DEXTROSE 50% IN WATER (D50W) IV SYRG: INTRAVENOUS | Qty: 50

## 2015-10-18 MED FILL — SODIUM CHLORIDE 0.9 % IV: INTRAVENOUS | Qty: 1000

## 2015-10-18 MED FILL — PROPOFOL 10 MG/ML IV EMUL: 10 mg/mL | INTRAVENOUS | Qty: 20

## 2015-10-18 MED FILL — SODIUM CITRATE 4 GRAM/100 ML (4 %) SOLUTION: 4 gram /100 mL ( %) | Qty: 2

## 2015-10-18 MED FILL — DILAUDID (PF) 2 MG/ML INJECTION SOLUTION: 2 mg/mL | INTRAMUSCULAR | Qty: 1

## 2015-10-18 MED FILL — FOLIC ACID 1 MG TAB: 1 mg | ORAL | Qty: 1

## 2015-10-18 MED FILL — HEPARIN (PORCINE) 5,000 UNIT/ML IJ SOLN: 5000 unit/mL | INTRAMUSCULAR | Qty: 2

## 2015-10-18 MED FILL — FAMOTIDINE 20 MG TAB: 20 mg | ORAL | Qty: 1

## 2015-10-18 MED FILL — CLONIDINE 0.1 MG TAB: 0.1 mg | ORAL | Qty: 1

## 2015-10-18 MED FILL — BD POSIFLUSH NORMAL SALINE 0.9 % INJECTION SYRINGE: INTRAMUSCULAR | Qty: 10

## 2015-10-18 MED FILL — MIDAZOLAM 1 MG/ML IJ SOLN: 1 mg/mL | INTRAMUSCULAR | Qty: 2

## 2015-10-18 NOTE — Interval H&P Note (Signed)
H&P Update:  Tyler Ballard was seen and examined.  History and physical has been reviewed. The patient has been examined. There have been no significant clinical changes since the completion of the originally dated History and Physical.    Signed By: Ouida SillsJason L Javarius Tsosie, MD     October 18, 2015 9:32 AM

## 2015-10-18 NOTE — Other (Signed)
TRANSFER - OUT REPORT:    Verbal report given to Jeff Rn(name) on Calpine CorporationElbert C Cedillo  being transferred to dialysis(unit) for routine progression of care       Report consisted of patient???s Situation, Background, Assessment and   Recommendations(SBAR).     Information from the following report(s) SBAR, Kardex, OR Summary, Procedure Summary, Intake/Output, MAR, Recent Results and Med Rec Status was reviewed with the receiving nurse.    Lines:   Double Lumen Hickman 09/04/13 Left Other(comment) (Active)   Central Line Being Utilized Yes 10/16/2015  8:20 PM   Site Assessment Clean, dry, & intact 10/16/2015  8:20 PM   Dressing Status Clean, dry, & intact 10/16/2015  8:20 PM       Peripheral IV 10/17/15 Right Forearm (Active)   Site Assessment Clean, dry, & intact 10/18/2015 10:52 AM   Phlebitis Assessment 0 10/18/2015 10:52 AM   Infiltration Assessment 0 10/18/2015 10:52 AM   Dressing Status Clean, dry, & intact 10/18/2015 10:52 AM   Dressing Type Tape;Transparent 10/18/2015 10:52 AM   Hub Color/Line Status Infusing;Pink 10/18/2015 10:52 AM        Opportunity for questions and clarification was provided.      Patient transported with:   [employer]

## 2015-10-18 NOTE — Other (Signed)
2142 Patient awake and requesting pain med, nausea, and itching medication. Patient informed MD would have to be call for pain medication order. Guard at bedside. Patient assessed, right groin cathter site, drsg I/D, no swelling and pedal pulse palpable. Will frequently monitor.

## 2015-10-18 NOTE — Other (Signed)
Dr. Debby BudAndre called and notified that patient has some blood from incision site. Dr. Debby BudAndre advised to place pressure dressing over site.

## 2015-10-18 NOTE — Anesthesia Post-Procedure Evaluation (Signed)
Post-Anesthesia Evaluation and Assessment    Patient: Tyler Ballard MRN: [id number]  SSN: [id number]    Date of Birth: [birth year]  Age: 34 y.o.  Sex: male       Cardiovascular Function/Vital Signs  Visit Vitals   ??? BP 173/90   ??? Pulse 86   ??? Temp 36.5 ??C (97.7 ??F)   ??? Resp 28   ??? Ht 5\' 7"  (1.702 m)   ??? Wt 102.2 kg (225 lb 5 oz)   ??? SpO2 100%   ??? BMI 35.29 kg/m2       Patient is status post MAC anesthesia for Procedure(s):  PERMANENT DIALYSIS CATHETER INSERTION .    Nausea/Vomiting: None    Postoperative hydration reviewed and adequate.    Pain:  Pain Scale 1: Numeric (0 - 10) (10/18/15 0850)  Pain Intensity 1: 0 (10/18/15 0850)   Managed    Neurological Status:       At baseline    Mental Status and Level of Consciousness: Arousable    Pulmonary Status:   O2 Device: Room air (10/18/15 1052)   Adequate oxygenation and airway patent    Complications related to anesthesia: None    Post-anesthesia assessment completed. No concerns    Signed By: Liston AlbaJu-Fang Caliyah Sieh, MD     October 18, 2015

## 2015-10-18 NOTE — Brief Op Note (Signed)
BRIEF OPERATIVE NOTE    Date of Procedure: 10/18/2015   Preoperative Diagnosis: DX  Postoperative Diagnosis: DX    Procedure(s):  PERMANENT DIALYSIS CATHETER INSERTION   Surgeon(s) and Role:     * Ouida SillsJason L Li Fragoso, MD - Primary            Surgical Staff:  Circ-1: Cline CrockStacie A Pittman, RN  Radiology Technician: Edwinna AreolaSarah Simmons Massey  Scrub Tech-1: Denzil Hughesomia F Whitfield  Surg Asst-1: Marliss Czaravid E. Pearson  Surg Asst-2: Jess BartersSamuel Branch Jr.  Event Time In   Incision Start 1014   Incision Close      Anesthesia: General   Estimated Blood Loss: 20mL   Specimens: * No specimens in log *   Findings: Satisfactory placement of cathter into IVC   Complications: None  Implants: 33cm Palindrome catheter

## 2015-10-18 NOTE — Other (Signed)
TRANSFER - OUT REPORT:    Verbal report given to Domink RN(name) on Jonathan C Wendorf  being transferred to [facility] (unit) for routine post - op       Report consisted of patient???s Situation, Background, Assessment and   Recommendations(SBAR).     Information from the following report(s) SBAR, Kardex, OR Summary, Procedure Summary, Intake/Output, MAR, Recent Results and Med Rec Status was reviewed with the receiving nurse.    Lines:   Double Lumen Hickman 09/04/13 Left Other(comment) (Active)   Central Line Being Utilized Yes 10/16/2015  8:20 PM   Site Assessment Clean, dry, & intact 10/16/2015  8:20 PM   Dressing Status Clean, dry, & intact 10/16/2015  8:20 PM       Peripheral IV 10/17/15 Right Forearm (Active)   Site Assessment Clean, dry, & intact 10/18/2015 10:52 AM   Phlebitis Assessment 0 10/18/2015 10:52 AM   Infiltration Assessment 0 10/18/2015 10:52 AM   Dressing Status Clean, dry, & intact 10/18/2015 10:52 AM   Dressing Type Tape;Transparent 10/18/2015 10:52 AM   Hub Color/Line Status Infusing;Pink 10/18/2015 10:52 AM        Opportunity for questions and clarification was provided.      Patient transported with:   Registered Nurse  Tech

## 2015-10-18 NOTE — Progress Notes (Addendum)
Unable to complete echocardiogram, patient was taken to surgery before coming to echo even though transport was there to take him down to department for the exam. The echocardiogram was attempted on the 10/17/15 as well the patient was in dialysis.

## 2015-10-18 NOTE — Anesthesia Pre-Procedure Evaluation (Addendum)
Anesthetic History     PONV          Review of Systems / Medical History  Patient summary reviewed, nursing notes reviewed and pertinent labs reviewed    Pulmonary                   Neuro/Psych       CVA  TIA     Cardiovascular    Hypertension: poorly controlled                   GI/Hepatic/Renal         Renal disease: ESRD and dialysis       Endo/Other        Obesity     Other Findings   Comments: No flu shot- patient refusal.    Pancreatic cancer, on chemotherapy.   HIV positive         Physical Exam    Airway  Mallampati: III  TM Distance: > 6 cm  Neck ROM: normal range of motion   Mouth opening: Normal     Cardiovascular    Rhythm: regular  Rate: normal         Dental    Dentition: Poor dentition     Pulmonary  Breath sounds clear to auscultation               Abdominal  GI exam deferred       Other Findings            Anesthetic Plan    ASA: 4  Anesthesia type: MAC          Induction: Intravenous  Anesthetic plan and risks discussed with: Patient

## 2015-10-18 NOTE — Other (Signed)
TRANSFER - IN REPORT:    Verbal report received from J. Poulliout, RN(name) on Calpine CorporationElbert C Mattie  being received from Dialysis(unit) for routine progression of care      Report consisted of patient???s Situation, Background, Assessment and   Recommendations(SBAR).     Information from the following report(s) SBAR, Kardex and Intake/Output was reviewed with the receiving nurse.    Opportunity for questions and clarification was provided.      Assessment completed upon patient???s arrival to unit and care assumed.

## 2015-10-18 NOTE — Progress Notes (Signed)
Has Femoral Catheter, was refusing to be dialyzed until he has BM, now agreed to go for dialysis, will dialyze   him today & the schedule for q M,w &.F.

## 2015-10-18 NOTE — Other (Signed)
Bedside and Verbal shift change report given to DOMINICK RN (oncoming nurse) by LINDA RN (offgoing nurse). Report included the following information SBAR, Kardex and MAR.

## 2015-10-18 NOTE — Other (Signed)
TRANSFER - IN REPORT:    Verbal report received from T. WInn, RN (name) on Delane GingerElbert C Ederer  being received from PACU (unit) for routine progression of care      Report consisted of patient???s Situation, Background, Assessment and   Recommendations(SBAR).     Information from the following report(s) SBAR, Kardex, Procedure Summary, MAR, Recent Results and Med Rec Status was reviewed with the receiving nurse.    Opportunity for questions and clarification was provided.      Assessment completed upon patient???s arrival to unit and care assumed.

## 2015-10-18 NOTE — Progress Notes (Signed)
RENAL PROGRESS NOTE: Pt seen on dialysis.  Follow up of ESRD     Present on Admission:  ??? ESRD (end stage renal disease) on dialysis [facility])  ??? HTN (hypertension)  ??? Sickle cell anemia with pain (HCC)  ??? Secondary hyperparathyroidism of renal origin [facility])         Subjective: Feels the same, finally in Dialysis unit, has (R) Femoral TDC.  Has Negative Hep B surface antigen.    Patient is on Dialysis.     Objective:    Patient Vitals for the past 12 hrs:   Temp Pulse Resp BP SpO2   10/18/15 1142 - 90 18 147/73 96 %   10/18/15 1132 - 87 18 145/88 -   10/18/15 1122 - 86 17 150/78 96 %   10/18/15 1112 - 86 18 163/81 100 %   10/18/15 1102 - 85 23 181/87 100 %   10/18/15 1052 97.7 ??F (36.5 ??C) 86 28 173/90 100 %   10/18/15 0722 98.3 ??F (36.8 ??C) 79 18 191/89 99 %   10/18/15 0400 98.2 ??F (36.8 ??C) 87 20 133/78 98 %        Intake/Output Summary (Last 24 hours) at 10/18/15 1406  Last data filed at 10/18/15 1035   Gross per 24 hour   Intake    820 ml   Output      0 ml   Net    820 ml       Physical Assessment:     General Appearance: NAD  Lung: clear to auscultation  Heart: regular rate and rhythm and no murmurs, clicks or gallops  Lower Extremities: has  edema   Access: (R) Femoral HD catheter.    Labs    CBC w/Diff    Recent Labs      10/17/15   1505  10/16/15   2051   WBC  2.4*  3.1*   RBC  2.88*  2.99*   HGB  8.7*  9.0*   HCT  27.5*  28.0*   MCV  95.5  93.6   MCH  30.2  30.1   MCHC  31.6  32.1   RDW  18.2*  18.1*    Recent Labs      10/17/15   1505  10/16/15   2051   MONOS  11*  14*   EOS  9*  6*   BASOS  0  0   RDW  18.2*  18.1*        Comprehensive Metabolic Profile    Recent Labs      10/17/15   1505  10/16/15   2051   NA  137  135*   K  5.5  5.3   CL  102  103   CO2  24  23   BUN  75*  71*   CREA  10.60*  10.10*    Recent Labs      10/17/15   1505  10/16/15   2051   CA  7.4*  7.4*   ALB  2.6*   --    TP  8.7*   --    SGOT  29   --    TBILI  0.5   --           Basic Metabolic Profile       results  reviwed.           MEDS:Reviwed.  Current Facility-Administered Medications   Medication Dose Route Frequency Provider Last  Rate Last Dose   ??? insulin lispro (HUMALOG) injection   SubCUTAneous Q6H Betsy PriesEmily E Schley, MD   Stopped at 10/18/15 0700   ??? heparin (porcine) 5,000 unit/mL injection            ??? heparinized saline 2 units/mL 1,000 unit/500 mL infusion            ??? lidocaine (PF) (XYLOCAINE) 10 mg/mL (1 %) injection            ??? sodium chloride (NS) flush 5-10 mL  5-10 mL IntraVENous PRN Alben DeedsEbony S McNeal, CRNA       ??? fentaNYL citrate (PF) injection 50 mcg  50 mcg IntraVENous Multiple Ebony S McNeal, CRNA   50 mcg at 10/18/15 1126   ??? naloxone (NARCAN) injection 0.1 mg  0.1 mg IntraVENous PRN Alben DeedsEbony S McNeal, CRNA       ??? insulin lispro (HUMALOG) injection   SubCUTAneous ONCE Alben DeedsEbony S McNeal, CRNA       ??? glucose chewable tablet 16 g  4 Tab Oral PRN Alben DeedsEbony S McNeal, CRNA       ??? glucagon (GLUCAGEN) injection 1 mg  1 mg IntraMUSCular PRN Alben DeedsEbony S McNeal, CRNA       ??? dextrose (D50W) injection syrg 12.5-25 g  25-50 mL IntraVENous PRN Alben DeedsEbony S McNeal, CRNA       ??? diphenhydrAMINE (BENADRYL) injection 25 mg  25 mg IntraVENous PRN Alben DeedsEbony S McNeal, CRNA   25 mg at 10/18/15 1109   ??? sodium citrate 4 gram /100 mL (4 %) 0.08 g  2 mL Does Not Apply DIALYSIS PRN Loreli DollarMosta Karlisa Gaubert, MD       ??? calcium acetate (PHOSLO) capsule 667 mg  1 Cap Oral TID WITH MEALS Wanda Plumpirghayu Desai, MD   667 mg at 10/18/15 1315   ??? cloNIDine HCl (CATAPRES) tablet 0.1 mg  0.1 mg Oral BID Wanda Plumpirghayu Desai, MD   0.1 mg at 10/18/15 0848   ??? famotidine (PEPCID) tablet 20 mg  20 mg Oral DAILY Wanda Plumpirghayu Desai, MD   20 mg at 10/18/15 1315   ??? folic acid (FOLVITE) tablet 1 mg  1 mg Oral DAILY Wanda Plumpirghayu Desai, MD   1 mg at 10/18/15 1315   ??? hydroxyurea (HYDREA) chemo cap 500 mg  500 mg Oral DAILY Wanda Plumpirghayu Desai, MD   500 mg at 10/18/15 1315   ??? lisinopril (PRINIVIL, ZESTRIL) tablet 10 mg  10 mg Oral DAILY Wanda Plumpirghayu Desai, MD   10 mg at 10/18/15 0848    ??? diphenhydrAMINE (BENADRYL) injection 12.5 mg  12.5 mg IntraVENous Q6H PRN Wanda Plumpirghayu Desai, MD   12.5 mg at 10/18/15 0414   ??? heparin (porcine) injection 5,000 Units  5,000 Units SubCUTAneous Q8H Wanda Plumpirghayu Desai, MD   5,000 Units at 10/18/15 0413   ??? epoetin alfa (EPOGEN;PROCRIT) 5,000 Units  5,000 Units IntraVENous DIALYSIS ONCE Loreli DollarMosta Floyce Bujak, MD   Stopped at 10/17/15 1000   ??? sodium citrate 4 gram /100 mL (4 %) 0.08 g  2 mL Hemodialysis DIALYSIS TUE, THU & SAT Loreli DollarMosta Bobetta Korf, MD   Stopped at 10/17/15 1000   ??? ondansetron (ZOFRAN) injection 4 mg  4 mg IntraVENous Q6H PRN Ouida SillsJason L Andre, MD   4 mg at 10/17/15 1413       Impression:    ESRD: Tolerating HD well.  Anemia of CKD: on  Epogen.  Hyperparathyroidism Yes  Leukopenia  Thrombocytopenia  Plan  Dialysis for volume and solute management  Epogen  : not today.  Hectorol: not today.  Not on Heparin,will use lock catheter with Na citrate.        Loreli Dollar, MD

## 2015-10-18 NOTE — Progress Notes (Signed)
Pt had an accu-check level of 68. Pt is NPO. Notified Dr Celine Mansesai and orders given. Gave pt 1/2 amp D50. Report given to day shift nurse

## 2015-10-18 NOTE — Progress Notes (Signed)
Hospitalist Progress Note    Patient: Tyler Ballard MRN: [id number]  CSN: [id number]    Date of Birth: 01-01-1981  Age: 34 y.o.  Sex: male    DOA: 10/16/2015 LOS:  LOS: 2 days          S/p 33 cm Palindrome catheter into IVC. Seen on HD. He denies constipation. Denies chest pain or shortness of breath. Has been getting iv benadryl several x daily. Requests increase in pain meds.     Assessment/Plan     1. HD access AV fistula is not functioning - dialysis nurse and Dr. Ardelle ParkHaque to determine if catheter functioning properly  2. ESRD / HD per nephrology.   3. Sickle cell anemia with history of acute chest syndrome - has received BT in the past.   4. Hypertension on ACE and catapres. Currently on HD.  5. Noncompliance w/ HD related to incarceration  6. Obesity Body mass index is 35.29 kg/(m^2).  7. pancytopenia in the setting of HIV  8. Elevated alkaline phosphatase  9. HIV - needs referral to HIV clinic. Follow CD4 count and viral load. Will call ID consult Monday.   10. DM2 - ADA diet when appropriate, ssi, check A1c.   [facility]. [facility], reports of drug seeking behavior during that admission  12. Anemia of ckd per renal  13. Secondary hyperparathyroidism of ckd per renal  14. Obesity Body mass index is 35.29 kg/(m^2).  15. dvt prophylaxis   16. Full code. Tele. [facility].     Additional Notes:      Case discussed with:  Patient  Family  Nursing  Case Management  DVT Prophylaxis:  Lovenox  Hep SQ  SCDs  Coumadin   On Heparin gtt    Vital signs/Intake and Output:  Visit Vitals   ??? BP 147/73   ??? Pulse 90   ??? Temp 97.7 ??F (36.5 ??C)   ??? Resp 18   ??? Ht 5\' 7"  (1.702 m)   ??? Wt 102.2 kg (225 lb 5 oz)   ??? SpO2 96%   ??? BMI 35.29 kg/m2     Current Shift:  10/30 0701 - 10/30 1900  In: 100 [I.V.:100]  Out: -   Last three shifts:  10/28 1901 - 10/30 0700  In: 1320 [P.O.:1320]  Out: 0     Awake alert and oriented. Seen on HD  Ncat. Perrl. Poor dentition  Left IJ TDC.   RRR  cta b.l   Soft nt nd nabs.  AVG.   No focal deficit  No rash    Medications Reviewed      Labs: Results:       Chemistry Recent Labs      10/17/15   1505  10/16/15   2051   GLU  122*  133*   NA  137  135*   K  5.5  5.3   CL  102  103   CO2  24  23   BUN  75*  71*   CREA  10.60*  10.10*   CA  7.4*  7.4*   AGAP  11  9   BUCR  7*  7*   AP  163*   --    TP  8.7*   --    ALB  2.6*   --    GLOB  6.1*   --    AGRAT  0.4*   --  CBC w/Diff Recent Labs      10/17/15   1505  10/16/15   2051   WBC  2.4*  3.1*   RBC  2.88*  2.99*   HGB  8.7*  9.0*   HCT  27.5*  28.0*   PLT  109*  91*   GRANS  50  57   LYMPH  30  23   EOS  9*  6*      Cardiac Enzymes No results for input(s): CPK, CKND1, MYO in the last 72 hours.    No lab exists for component: CKRMB, TROIP   Coagulation Recent Labs      10/18/15   0745   PTP  15.4*   INR  1.3*       Lipid Panel Lab Results   Component Value Date/Time    CHOLESTEROL, TOTAL 85 11/29/2012 09:36 AM    HDL CHOLESTEROL 33 11/29/2012 09:36 AM    LDL, CALCULATED 41.2 11/29/2012 09:36 AM    VLDL, CALCULATED 10.8 11/29/2012 09:36 AM    TRIGLYCERIDE 54 11/29/2012 09:36 AM    CHOL/HDL RATIO 2.6 11/29/2012 09:36 AM      BNP No results for input(s): BNPP in the last 72 hours.   Liver Enzymes Recent Labs      10/17/15   1505   TP  8.7*   ALB  2.6*   AP  163*   SGOT  29      Thyroid Studies No results found for: T4, T3U, TSH, TSHEXT, TSHEXT     Procedures/imaging: see electronic medical records for all procedures/Xrays and details which were not copied into this note but were reviewed prior to creation of Plan.

## 2015-10-18 NOTE — Other (Addendum)
Bedside and Verbal shift change report received from L. Tole, RN. Report included the following information SBAR, Kardex, MAR and Recent Results.      1350- Pt requested PRN Benadryl, 12.5 mg IV. Pt given Benadryl, 25 mg IV by the PACU nurse while in PACU at 1109. Pt informed that next dose of Benadry would not be available until 1709.

## 2015-10-18 NOTE — Other (Signed)
Patient has refused to go to dialysis straight from recovery. Patient transported to unit room.

## 2015-10-18 NOTE — Other (Signed)
Bedside and Verbal shift change report given to Marylouise StacksWilma, RN (oncoming nurse) by D. Dorris CarnesShields, RN Physiological scientist(offgoing nurse). Report included the following information SBAR, Kardex, Procedure Summary, Intake/Output and MAR.

## 2015-10-18 NOTE — Other (Signed)
Patient received 2mg  or dilaudid. 0mg  wasted per Brendolyn PattyShajauna Odom, RN witness. Patient received 100mcg of Fentanyl, 0 mcg wasted per S. Odom, RN.

## 2015-10-19 LAB — HEPATITIS B SURFACE ANTIBODY
Hep B S Ab Interp: NEGATIVE — AB
Hep B S Ab: 5.54 m[IU]/mL — ABNORMAL LOW (ref >10.0)

## 2015-10-19 LAB — GLUCOSE, POC
Glucose (POC): 129 mg/dL — ABNORMAL HIGH (ref 70–110)
Glucose (POC): 74 mg/dL (ref 70–110)
Glucose (POC): 128 mg/dL — ABNORMAL HIGH (ref 70–110)
Glucose (POC): 98 mg/dL (ref 70–110)

## 2015-10-19 LAB — CBC WITH AUTOMATED DIFF
ABS. BASOPHILS: 0 10*3/uL (ref 0.0–0.06)
ABS. EOSINOPHILS: 0.1 10*3/uL (ref 0.0–0.4)
ABS. LYMPHOCYTES: 0.5 10*3/uL — ABNORMAL LOW (ref 0.8–3.5)
ABS. MONOCYTES: 0.2 10*3/uL (ref 0–1.0)
ABS. NEUTROPHILS: 1 10*3/uL — ABNORMAL LOW (ref 1.8–8.0)
BASOPHILS: 0 % (ref 0–3)
EOSINOPHILS: 6 % — ABNORMAL HIGH (ref 0–5)
HCT: 26.6 % — ABNORMAL LOW (ref 36.0–48.0)
HGB: 8.1 g/dL — ABNORMAL LOW (ref 13.0–16.0)
LYMPHOCYTES: 30 % (ref 20–51)
MCH: 29.9 PG (ref 24.0–34.0)
MCHC: 30.5 g/dL — ABNORMAL LOW (ref 31.0–37.0)
MCV: 98.2 FL — ABNORMAL HIGH (ref 74.0–97.0)
MONOCYTES: 12 % — ABNORMAL HIGH (ref 2–9)
MPV: 11.1 FL (ref 9.2–11.8)
NEUTROPHILS: 52 % (ref 42–75)
PLATELET COMMENTS: DECREASED
PLATELET: 85 10*3/uL — ABNORMAL LOW (ref 135–420)
RBC: 2.71 M/uL — ABNORMAL LOW (ref 4.70–5.50)
RDW: 17.9 % — ABNORMAL HIGH (ref 11.6–14.5)
TOTAL CELLS COUNTED: 50
WBC: 1.8 10*3/uL — ABNORMAL LOW (ref 4.6–13.2)

## 2015-10-19 LAB — METABOLIC PANEL, BASIC
Anion gap: 10 mmol/L (ref 3.0–18)
BUN/Creatinine ratio: 7 — ABNORMAL LOW (ref 12–20)
BUN: 50 MG/DL — ABNORMAL HIGH (ref 7.0–18)
CO2: 25 mmol/L (ref 21–32)
Calcium: 7.7 MG/DL — ABNORMAL LOW (ref 8.5–10.1)
Chloride: 103 mmol/L (ref 100–108)
Creatinine: 7.26 MG/DL — ABNORMAL HIGH (ref 0.6–1.3)
GFR est AA: 11 mL/min/{1.73_m2} — ABNORMAL LOW (ref >60)
GFR est non-AA: 9 mL/min/{1.73_m2} — ABNORMAL LOW (ref >60)
Glucose: 85 mg/dL (ref 74–99)
Potassium: 5.1 mmol/L (ref 3.5–5.5)
Sodium: 138 mmol/L (ref 136–145)

## 2015-10-19 LAB — HEMOGLOBIN A1C WITH EAG: Hemoglobin A1c: <3.5 % — ABNORMAL LOW (ref 4.2–5.6)

## 2015-10-19 LAB — PHOSPHORUS: Phosphorus: 4.7 MG/DL (ref 2.5–4.9)

## 2015-10-19 LAB — HEP B SURFACE AB
Hep B surface Ab Interp.: NEGATIVE — AB
Hepatitis B surface Ab: 5.54 m[IU]/mL — ABNORMAL LOW (ref >10.0)

## 2015-10-19 LAB — PROTHROMBIN TIME + INR
INR: 1.3 — ABNORMAL HIGH (ref 0.8–1.2)
Prothrombin time: 15.6 s — ABNORMAL HIGH (ref 11.5–15.2)

## 2015-10-19 LAB — PTH INTACT
Calcium: 8 MG/DL — ABNORMAL LOW (ref 8.5–10.1)
PTH, Intact: 1630.4 pg/mL — ABNORMAL HIGH (ref 14.0–72.0)

## 2015-10-19 MED ORDER — OXYCODONE-ACETAMINOPHEN 7.5 MG-325 MG TAB
Freq: Four times a day (QID) | ORAL | Status: DC | PRN
Start: 2015-10-19 — End: 2015-10-28
  Administered 2015-10-19 – 2015-10-25 (×11): via ORAL

## 2015-10-19 MED ORDER — AMMONIUM LACTATE 12 % LOTION
12 % | Freq: Two times a day (BID) | CUTANEOUS | Status: DC
Start: 2015-10-19 — End: 2015-11-03
  Administered 2015-10-20 – 2015-10-31 (×14): via TOPICAL

## 2015-10-19 MED ORDER — DIPHENHYDRAMINE 25 MG CAP
25 mg | Freq: Four times a day (QID) | ORAL | Status: DC | PRN
Start: 2015-10-19 — End: 2015-11-03
  Administered 2015-10-19 – 2015-11-03 (×25): via ORAL

## 2015-10-19 MED ORDER — INSULIN LISPRO 100 UNIT/ML INJECTION
100 unit/mL | Freq: Four times a day (QID) | SUBCUTANEOUS | Status: DC
Start: 2015-10-19 — End: 2015-10-20

## 2015-10-19 MED FILL — HYDROXYUREA 500 MG CAPSULE: 500 mg | ORAL | Qty: 1

## 2015-10-19 MED FILL — DIPHENHYDRAMINE HCL 50 MG/ML IJ SOLN: 50 mg/mL | INTRAMUSCULAR | Qty: 1

## 2015-10-19 MED FILL — INSULIN LISPRO 100 UNIT/ML INJECTION: 100 unit/mL | SUBCUTANEOUS | Qty: 1

## 2015-10-19 MED FILL — CALCIUM ACETATE 667 MG CAP: 667 mg | ORAL | Qty: 1

## 2015-10-19 MED FILL — OXYCODONE-ACETAMINOPHEN 7.5 MG-325 MG TAB: ORAL | Qty: 1

## 2015-10-19 MED FILL — CLONIDINE 0.1 MG TAB: 0.1 mg | ORAL | Qty: 1

## 2015-10-19 MED FILL — PROCRIT 3,000 UNIT/ML INJECTION SOLUTION: 3000 unit/mL | INTRAMUSCULAR | Qty: 1

## 2015-10-19 MED FILL — FAMOTIDINE 20 MG TAB: 20 mg | ORAL | Qty: 1

## 2015-10-19 MED FILL — AMMONIUM LACTATE 12 % LOTION: 12 % | CUTANEOUS | Qty: 225

## 2015-10-19 MED FILL — FOLIC ACID 1 MG TAB: 1 mg | ORAL | Qty: 1

## 2015-10-19 MED FILL — LISINOPRIL 10 MG TAB: 10 mg | ORAL | Qty: 1

## 2015-10-19 MED FILL — HEPARIN (PORCINE) 5,000 UNIT/ML IJ SOLN: 5000 unit/mL | INTRAMUSCULAR | Qty: 1

## 2015-10-19 MED FILL — ONDANSETRON (PF) 4 MG/2 ML INJECTION: 4 mg/2 mL | INTRAMUSCULAR | Qty: 2

## 2015-10-19 MED FILL — DIPHENHYDRAMINE 25 MG CAP: 25 mg | ORAL | Qty: 1

## 2015-10-19 MED FILL — SODIUM CITRATE 4 GRAM/100 ML (4 %) SOLUTION: 4 gram /100 mL ( %) | Qty: 2

## 2015-10-19 NOTE — Other (Signed)
IDR/SLIDR Summary          Patient: Tyler Ballard MRN: [id number]    Age: 34 y.o.     Birthdate: [birth year] Room/Bed: 360/01   Admit Diagnosis: Renal failure  ESRD (end stage renal disease) (HCC)  DX  Principal Diagnosis: <principal problem not specified>     Goals: out of bed   Readmission: NO  Quality Measure: Not applicable  VTE Prophylaxis: Chemical heparin  Influenza Vaccine screening completed? YES  Pneumococcal Vaccine screening completed? YES  Mobility needs: Yes   Nutrition plan:Yes  Consults Nephrology / vascular   Financial concerns:No  Escalated to CM? NO  RRAT Score:7   Interventions:Home Health  Testing due for pt today?    LOS: 3 days Expected length of stay 3 days  Discharge plan: home   PCP: Phys Other, MD  Transportation needs: Yes    Days before discharge:two or more days before discharge   Discharge disposition: Home    Signed:     Raynelle FanningSherri L Smalls, RN  10/19/2015  10:26 AM

## 2015-10-19 NOTE — Op Note (Signed)
[facility]                               [address]                          [place], [place] [id number]                                 OPERATIVE REPORT    PATIENT:     Tyler GingerSLAUGHTER, Kaeleb C  MRN:            [id number]   DATE:   10/18/2015  BILLING:     [id number]  ROOM:        [id number]  ATTENDING:   Wanda PlumpIRGHAYU DESAI, MD  DICTATING:   Ouida SillsJASON L Leam Madero, MD      PREOPERATIVE DIAGNOSIS:  End-stage renal disease.    POSTOPERATIVE DIAGNOSIS:  End-stage renal disease.    PROCEDURES PERFORMED  1. Ultrasound-guided percutaneous access of the right common femoral vein.  2. Placement of a 33 cm tunneled Palindrome catheter.    SURGEON:  Danella MaiersJason Shamere Campas, M.D.    ESTIMATED BLOOD LOSS: 50mL    SPECIMENS REMOVED: None    ANESTHESIA:  Monitored anesthesia care.    PACKS AND DRAINS:  None.    IMPLANTS:  A 3 cm Palindrome catheter.    COMPLICATIONS:  None.    CONDITION:  To recovery stable.    FINDINGS:  Satisfactory placement of a 33 cm Palindrome catheter into the  inferior vena cava, with good blood return and easy flushability.    INDICATIONS FOR PROCEDURE:  The patient is a 34 year old gentleman who  [facility] with end-stage renal disease and no viable  dialysis access.  The patient had a previous right subclavian vein stent  that was placed overlying the origin of the right internal jugular vein, as  well as a tunneled left subclavian catheter for chemotherapy.  Given these  findings, it did not appear that the jugular vein was a viable option for  access, and so a decision was made to place a tunneled right femoral  dialysis catheter.    INFORMED CONSENT:  Informed consent was obtained.    DESCRIPTION OF PROCEDURE:  On 10/18/2015, this patient was taken to the  operating room and identified by name and ID bracelet by myself and the  entire team.  Once this was done, the patient was placed on the operating   table in supine position.  After an appropriate depth of anesthesia was  obtained, the patient was prepped and draped, and a time-out was  performed.  At this point, using the ultrasound, the right common femoral  vein was interrogated.  Lidocaine 1% was used to anesthetize the right  groin.  The vein was compressible, patent, and appeared to be an  appropriate site for access.  Percutaneous access of the right common  femoral vein was then obtained.  A Bentson wire was then advanced up into  the inferior vena cava, which was confirmed by fluoroscopy.  At this point,  we made a counter-incision on the patient's thigh and tunneled a 33 cm  Palindrome catheter through this area.  We then dilated our tract and  placed a peel-away sheath over the wire into the inferior vena cava.  The  catheter was then advanced  through the peel-away sheath and into the  inferior vena cava, and confirmation of placement into the inferior vena  cava was obtained by fluoroscopy.  We had good blood return and easy  flushability.  We then instilled 2 mL of concentrated heparin into both  lumens of the catheter.  The catheter was secured to the thigh using  Prolene suture, and the puncture site was then closed with a Vicryl  suture.  At the end of the procedure, all counts were correct x2.  I was  present and scrubbed for the entire procedure.                      Dictated By: Ouida SillsJASON L Crosby Oriordan, MD   Signed ByNarda Rutherford:    JLA:wmx  D: 10/19/2015 T: 10/19/2015 10:12 A  Job: [id number]  CScriptDoc #: [id number]  cc:   Ouida SillsJASON L Naysha Sholl, MD        Wanda PlumpIRGHAYU DESAI, MD

## 2015-10-19 NOTE — Other (Signed)
Bedside and Verbal shift change report given to A. Verdie Shire, RN (Cabin crew) by Maximino Greenland, RN (offgoing nurse). Report included the following information SBAR, Kardex, MAR and Recent Results.    SITUATION:   ? Code Status: Full Code  ? Reason for Admission: Renal failure  ? ESRD (end stage renal disease) (HCC)  ? DX    ? Hospital day: 3  ? Problem List:       Hospital Problems  Date Reviewed: November 14, 2015          Codes Class Noted POA    Secondary hyperparathyroidism of renal origin [facility]) ICD-10-CM: N25.81  ICD-9-CM: 588.81  2015/11/14 Yes        HIV (human immunodeficiency virus infection) (HCC) ICD-10-CM: Z21  ICD-9-CM: V08  10/17/2015 Unknown        Renal failure ICD-10-CM: N19  ICD-9-CM: 586  10/16/2015 Unknown        ESRD (end stage renal disease) on dialysis [facility]) (Chronic) ICD-10-CM: N18.6, Z99.2  ICD-9-CM: 585.6, V45.11  07/06/2012 Yes        HTN (hypertension) (Chronic) ICD-10-CM: I10  ICD-9-CM: 401.9  07/06/2012 Yes        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 AVN (avascular necrosis of bone) (HCC)      right hip   ??? Chronic kidney disease    ??? Chronic kidney disease (CKD), stage V (HCC)    ??? Dialysis patient [facility])    ??? ESRD on hemodialysis (HCC)    ??? HIV (human immunodeficiency virus infection) (HCC)    ??? HTN (hypertension) 07/06/2012   ??? HTN (hypertension)    ??? Hypertension    ??? Malingering      Sickle Cell testing negative   ??? Malingering      Patient states has had sickle cell disease since age of 38, hemoglobin electrophoresis at [facility] hospital and [facility] both negative for for sickle cell disease   ??? Noncompliance of patient with renal dialysis (HCC) 09/06/2013   ??? Sickle cell anemia (HCC)      NEGATIVE test. Likely patient states this Dx for secondary gain   ??? Sickle cell disease (HCC)      TOLD BY PATIENT AND NOT TRUE   ??? Stroke (HCC)       2014 pts states he has weakness to right side upper and lower extremities          Patient taking anticoagulants yes     ASSESSMENT:   ? Changes in Assessment Throughout Shift: none    ? Patient has Central Line: yes Reasons if yes:    ? Patient has Foley Cath: no Reasons if yes: na     ? Last Vitals:     Vitals:    10/19/15 0349   BP: 179/82   Pulse: 88   Resp: 20   Temp: 97.6 ??F (36.4 ??C)   SpO2: 97%   Weight:    Height:        ? IV and DRAINS (will only show if present)   Double Lumen [place]man 09/04/13 Left Other(comment)-Site Assessment: Clean, dry, & intact  Peripheral IV 10/17/15 Right Forearm-Site Assessment: Clean, dry, & intact  Hemodialysis Catheter November 14, 2015-Site Assessment: Clean, dry, & intact    ? WOUND (if present)  Wound Type:  none   Dressing present Dressing Present : No   Wound Concerns/Notes:  none    ? PAIN    Pain Assessment    Pain Intensity 1: 0 (10/19/15 0349)    Pain Location 1: Groin    Pain Intervention(s) 1: Medication (see MAR)    Patient Stated Pain Goal: 0  o Interventions for Pain:  none, medication  o Intervention effective: yes  o Time of last intervention:  See Mar   o Reassessment Completed: yes     ? Last 3 Weights:  Last 3 Recorded Weights in this Encounter    10/17/15 0125 10/17/15 0433 10/18/15 0357   Weight: 99 kg (218 lb 4.1 oz) 99.6 kg (219 lb 9.3 oz) 102.2 kg (225 lb 5 oz)     Weight change:     ? INTAKE/OUPUT    Current Shift: 10/30 1901 - 10/31 0700  In: 240 [P.O.:240]  Out: 0     Last three shifts: 10/29 0701 - 10/30 1900  In: 1060 [P.O.:960; I.V.:100]  Out: 0     ? LAB RESULTS     Recent Labs      10/17/15   1505  10/16/15   2051   WBC  2.4*  3.1*   HGB  8.7*  9.0*   HCT  27.5*  28.0*   PLT  109*  91*        Recent Labs      10/18/15   0745  10/17/15   1505  10/16/15   2051   NA   --   137  135*   K   --   5.5  5.3   GLU   --   122*  133*   BUN   --   75*  71*   CREA   --   10.60*  10.10*   CA   --   7.4*  7.4*   MG   --    --   2.2   INR  1.3*   --    --         RECOMMENDATIONS AND DISCHARGE PLANNING     1. Pending tests/procedures/ Plan of Care or Other Needs: none     2. Discharge plan for patient and Needs/Barriers: return to jail/ none    3. Estimated Discharge Date: pending Posted on Whiteboard in Patient???s Room: yes      4. The patient's care plan was reviewed with the oncoming nurse.       "HEALS" SAFETY CHECK      Fall Risk    Total Score: 3    Safety Measures: Safety Measures: Bed/Chair-Wheels locked, Bed in low position, Call light within reach, Fall prevention (comment), Side rails X 3, Other (comment) (guarddat bedside)    A safety check occurred in the patient's room between off going nurse and oncoming nurse listed above.    The safety check included the below items  Area Items   H  High Alert Medications ? Verify all high alert medication drips (heparin, PCA, etc.)   E  Equipment ? Suction is set up for ALL patients (with yanker)  ? Red plugs utilized for all equipment (IV pumps, etc.)  ? WOW???s wiped down at end of shift.  ? Room stocked with oxygen, suction, and other unit-specific supplies   A  Alarms ? Bed alarm is set for fall risk patients  ? Ensure chair alarm is in place and activated  if patient is up in a chair   L  Lines ? Check IV for any infiltration  ? Foley bag is empty if patient has a Foley   ? Tubing and IV bags are labeled   S  Safety   ? Room is clean, patient is clean, and equipment is clean.  ? Hallways are clear from equipment besides carts.   ? Fall bracelet on for fall risk patients  ? Ensure room is clear and free of clutter  ? Suction is set up for ALL patients (with yanker)  ? Hallways are clear from equipment besides carts.   ? Isolation precautions followed, supplies available outside room, sign posted     Maximino Greenland, RN

## 2015-10-19 NOTE — Other (Signed)
Patient  In dialysis. Report given by Legrand PittsAmy Burge RN.

## 2015-10-19 NOTE — Progress Notes (Signed)
Care Management Interventions  PCP Verified by CM: Yes  Palliative Care Consult (Criteria: CHF and RRAT>21): No  Reason for No Palliative Care Consult: Other (see comment)  Mode of Transport at Discharge: Other (see comment)  Transition of Care Consult (CM Consult): Discharge Planning  MyChart Signup: No  Discharge Durable Medical Equipment: No  Health Maintenance Reviewed: Yes  Physical Therapy Consult: No  Occupational Therapy Consult: No  Speech Therapy Consult: No  Current Support Network: Education officer, [facility]  Confirm Follow Up Transport: Other (see comment) ([facility])  Plan discussed with Pt/Family/Caregiver: Yes  Discharge Location  Discharge Placement:  [facility])     Patient 34 years old male admitted to tele with renal failure. Saw the patient in room, he is supervised [facility]. Patient AAO x 4, open to conversation, he confirms to be [facility] the [facility]. Patient has history of ESRD, [facility] Patient says he is in jail for "probation violation" and will be there until the end of December. Patient aware that [facility] dc [facility], who will also provide him with transportation at dc. Robbie Lis McFadden, rn, cm

## 2015-10-19 NOTE — Op Note (Signed)
[facility]                               [address] [place], [place] [address]                                 OPERATIVE REPORT    PATIENT:     Tyler Ballard, Tyler Ballard  MRN:            [id number]   DATE:   10/18/2015  BILLING:     [id number]  ROOM:        [id number]  ATTENDING:   Wanda Plump, MD  DICTATING:   Ouida Sills, MD      PREOPERATIVE DIAGNOSIS:  End-stage renal disease.    POSTOPERATIVE DIAGNOSIS:  End-stage renal disease.    PROCEDURES PERFORMED  1. Ultrasound-guided percutaneous access of the right common femoral vein.  2. Placement of a 33 cm tunneled Palindrome catheter.    SURGEON:  Danella Maiers, M.D.    ESTIMATED BLOOD LOSS: 50mL    SPECIMENS REMOVED: None    ANESTHESIA:  Monitored anesthesia care.    PACKS AND DRAINS:  None.    IMPLANTS:  A 3 cm Palindrome catheter.    COMPLICATIONS:  None.    CONDITION:  To recovery stable.    FINDINGS:  Satisfactory placement of a 33 cm Palindrome catheter into the  inferior vena cava, with good blood return and easy flushability.    INDICATIONS FOR PROCEDURE:  The patient is a 34 year old gentleman who  presented to [facility] with end-stage renal disease and no viable  dialysis access.  The patient had a previous right subclavian vein stent  that was placed overlying the origin of the right internal jugular vein, as  well as a tunneled left subclavian catheter for chemotherapy.  Given these  findings, it did not appear that the jugular vein was a viable option for  access, and so a decision was made to place a tunneled right femoral  dialysis catheter.    INFORMED CONSENT:  Informed consent was obtained.    DESCRIPTION OF PROCEDURE:  On 10/18/2015, this patient was taken to the  operating room and identified by name and ID bracelet by myself and the  entire team.  Once this was done, the patient was placed on the operating  table in supine position.  After an appropriate depth of  anesthesia was  obtained, the patient was prepped and draped, and a time-out was  performed.  At this point, using the ultrasound, the right common femoral  vein was interrogated.  Lidocaine 1% was used to anesthetize the right  groin.  The vein was compressible, patent, and appeared to be an  appropriate site for access.  Percutaneous access of the right common  femoral vein was then obtained.  A Bentson wire was then advanced up into  the inferior vena cava, which was confirmed by fluoroscopy.  At this point,  we made a counter-incision on the patient's thigh and tunneled a 33 cm  Palindrome catheter through this area.  We then dilated our tract and  placed a peel-away sheath over the wire into the inferior vena cava.  The  catheter was then advanced  through the peel-away sheath and into the  inferior vena cava, and confirmation of placement into the inferior vena  cava was obtained by fluoroscopy.  We had good blood return and easy  flushability.  We then instilled 2 mL of concentrated heparin into both  lumens of the catheter.  The catheter was secured to the thigh using  Prolene suture, and the puncture site was then closed with a Vicryl  suture.  At the end of the procedure, all counts were correct x2.  I was  present and scrubbed for the entire procedure.                      Dictated By: Ouida SillsJASON L Crosby Oriordan, MD   Signed ByNarda Rutherford:    JLA:wmx  D: 10/19/2015 T: 10/19/2015 10:12 A  Job: [id number]  CScriptDoc #: [id number]  cc:   Ouida SillsJASON L Naysha Sholl, MD        Wanda PlumpIRGHAYU DESAI, MD

## 2015-10-19 NOTE — Other (Signed)
Patient in from dialysis unit per bed, awake, alert and oriented. Monitoring continue

## 2015-10-19 NOTE — Progress Notes (Addendum)
Attempted to get patient for  2D Echocardiogram. He wants to eat his breakfast. Will re-attempt later.    Lauralyn PrimesErica Kellam, RCS, RDCS

## 2015-10-19 NOTE — Other (Addendum)
Bedside shift change report given to Aayushi Solorzano Lanae Crumbly Josie Mesa, RN (oncoming nurse) by Maximino GreenlandWilma Gamble, RN (offgoing nurse). Report included the following information SBAR, Kardex and MAR.     1332 - Telephoned lab for technician to draw labs, patient in room, stat orders in system.    1420 - Patient refused to have labs drawn.  Patient stated:  "can be done at dialysis".    1920 - Bedside shift change report given to Valentina GuLucy sumulong, RN (oncoming nurse) by Legrand PittsAmy Gaston Dase, RN (offgoing nurse). Report included the following information SBAR, Kardex and MAR.

## 2015-10-19 NOTE — Progress Notes (Signed)
Patient is not available to be assessed at this time.    Chaplain Kerry Pokines   Board Certified Chaplain   Spiritual Care   [phone]

## 2015-10-19 NOTE — Progress Notes (Signed)
Completed 2D Echocardiogram. Report to follow. Patient to be transported back to room.    Erica Kellam, RCS, RDCS

## 2015-10-19 NOTE — Other (Deleted)
Please clarify this patient???s HIV status as one of the following:  =>HIV Infection asymptomatic, Positive serology only   =>HIV Infection symptomatic  =>HIV Disease/AIDS (e.g. current or historical diagnosis of a HIV related               illness/opportunistic infection)  =>Unable to clinically determine    If you DECLINE this query or would like to communicate with CDMP, please utilize the "CDMP message box" at the TOP of the Progress Note on the right.      Wendy PoetLisa Briesemeister RN BS CCDS [phone]

## 2015-10-19 NOTE — Progress Notes (Signed)
Hospitalist Progress Note    Patient: Tyler Ballard MRN: [id number]  CSN: [id number]    Date of Birth: 07-Sep-1981  Age: 34 y.o.  Sex: male    DOA: 10/16/2015 LOS:  LOS: 3 days          He reports he is waiting for vascular procedure. He reports dry skin to LE. Denies cp or shortness of breath.     Assessment/Plan     1. Non-functioning HD access - s/p palindrome catheter per vascular  2. ESRD / HD per nephrology.   3. Sickle cell anemia with history of acute chest syndrome - has received BT in the past.   4. Hypertension on ACE and catapres.  5. Noncompliance w/ HD related to incarceration  6. Obesity Body mass index is 34.46 kg/(m^2).  7. pancytopenia in the setting of HIV  8. Elevated alkaline phosphatase  9. HIV - needs referral to HIV clinic. Follow CD4 count and viral load. ID consulted.   10. DM2 - ADA diet when appropriate, ssi, check A1c.   [facility], reports of drug seeking behavior during that admission  12. Anemia of ckd per renal  13. Secondary hyperparathyroidism of ckd per renal  14. Obesity Body mass index is 34.46 kg/(m^2).  15. dvt prophylaxis   16. Full code. [facility]     Additional Notes:      Case discussed with:  Patient  Family  Nursing  Case Management  DVT Prophylaxis:  Lovenox  Hep SQ  SCDs  Coumadin   On Heparin gtt    Vital signs/Intake and Output:  Visit Vitals   ??? BP 188/89 (BP 1 Location: Right arm, BP Patient Position: At rest)   ??? Pulse 85   ??? Temp 98 ??F (36.7 ??C)   ??? Resp 19   ??? Ht 5\' 7"  (1.702 m)   ??? Wt 99.8 kg (220 lb)   ??? SpO2 94%   ??? BMI 34.46 kg/m2     Current Shift:  10/31 0701 - 10/31 1900  In: 240 [P.O.:240]  Out: -   Last three shifts:  10/29 1901 - 10/31 0700  In: 1060 [P.O.:960; I.V.:100]  Out: 0     Awake alert and oriented.  Ncat. Perrl. Poor dentition  Left IJ TDC.   RRR  cta b.l  Soft nt nd nabs.  AVG.   No focal deficit  No rash    Medications Reviewed      Labs: Results:       Chemistry Recent Labs       10/17/15   1505  10/16/15   2051   GLU  122*  133*   NA  137  135*   K  5.5  5.3   CL  102  103   CO2  24  23   BUN  75*  71*   CREA  10.60*  10.10*   CA  7.4*  7.4*   AGAP  11  9   BUCR  7*  7*   AP  163*   --    TP  8.7*   --    ALB  2.6*   --    GLOB  6.1*   --    AGRAT  0.4*   --       CBC w/Diff Recent Labs      10/17/15   1505  10/16/15   2051   WBC  2.4*  3.1*   RBC  2.88*  2.99*   HGB  8.7*  9.0*   HCT  27.5*  28.0*   PLT  109*  91*   GRANS  50  57   LYMPH  30  23   EOS  9*  6*      Cardiac Enzymes No results for input(s): CPK, CKND1, MYO in the last 72 hours.    No lab exists for component: CKRMB, TROIP   Coagulation Recent Labs      10/18/15   0745   PTP  15.4*   INR  1.3*       Lipid Panel Lab Results   Component Value Date/Time    CHOLESTEROL, TOTAL 85 11/29/2012 09:36 AM    HDL CHOLESTEROL 33 11/29/2012 09:36 AM    LDL, CALCULATED 41.2 11/29/2012 09:36 AM    VLDL, CALCULATED 10.8 11/29/2012 09:36 AM    TRIGLYCERIDE 54 11/29/2012 09:36 AM    CHOL/HDL RATIO 2.6 11/29/2012 09:36 AM      BNP No results for input(s): BNPP in the last 72 hours.   Liver Enzymes Recent Labs      10/17/15   1505   TP  8.7*   ALB  2.6*   AP  163*   SGOT  29      Thyroid Studies No results found for: T4, T3U, TSH, TSHEXT, TSHEXT     Procedures/imaging: see electronic medical records for all procedures/Xrays and details which were not copied into this note but were reviewed prior to creation of Plan.

## 2015-10-19 NOTE — Progress Notes (Signed)
Progress Note    Tyler Ballard  34 y.o.      Admit Date: 10/16/2015  Active Problems:    Sickle cell anemia with pain (HCC) (07/05/2012) POA: Yes      ESRD (end stage renal disease) on dialysis (HCC) (07/06/2012) POA: Yes      HTN (hypertension) (07/06/2012) POA: Yes      Renal failure (10/16/2015) POA: Unknown      HIV (human immunodeficiency virus infection) (HCC) (10/17/2015) POA: Unknown      Secondary hyperparathyroidism of renal origin (HCC) (10/18/2015) POA: Yes            Subjective:     Patient feels good, complains of pain in the groin, wants to be seen by Oncologist & ID consultant for his ? Pancreatic  Cancer & HIV, was dialyzed yesterday.       A comprehensive review of systems was negative except for that written in the History of Present Illness.    Objective:     Visit Vitals   ??? BP 188/89 (BP 1 Location: Right arm, BP Patient Position: At rest)   ??? Pulse 85   ??? Temp 98 ??F (36.7 ??C)   ??? Resp 19   ??? Ht 5\' 7"  (1.702 m)   ??? Wt 99.8 kg (220 lb)   ??? SpO2 94%   ??? BMI 34.46 kg/m2         Intake/Output Summary (Last 24 hours) at 10/19/15 1233  Last data filed at 10/19/15 0603   Gross per 24 hour   Intake    480 ml   Output      0 ml   Net    480 ml       Current Facility-Administered Medications   Medication Dose Route Frequency Provider Last Rate Last Dose   ??? sodium chloride (NS) flush 5-10 mL  5-10 mL IntraVENous PRN Alben DeedsEbony S McNeal, CRNA       ??? naloxone (NARCAN) injection 0.1 mg  0.1 mg IntraVENous PRN Alben DeedsEbony S McNeal, CRNA       ??? glucose chewable tablet 16 g  4 Tab Oral PRN Alben DeedsEbony S McNeal, CRNA       ??? glucagon (GLUCAGEN) injection 1 mg  1 mg IntraMUSCular PRN Alben DeedsEbony S McNeal, CRNA       ??? dextrose (D50W) injection syrg 12.5-25 g  25-50 mL IntraVENous PRN Alben DeedsEbony S McNeal, CRNA       ??? sodium citrate 4 gram /100 mL (4 %) 0.08 g  2 mL Does Not Apply DIALYSIS PRN Loreli DollarMosta Wane Mollett, MD       ??? acetaminophen (TYLENOL) tablet 500 mg  500 mg Oral Q6H PRN Betsy PriesEmily E Schley, MD        ??? oxyCODONE-acetaminophen (PERCOCET 7.5) 7.5-325 mg per tablet 1 Tab  1 Tab Oral Q6H PRN Edilia BoKabaye Berhanu, MD   1 Tab at 10/19/15 0857   ??? insulin lispro (HUMALOG) injection   SubCUTAneous AC&HS Wanda Plumpirghayu Desai, MD   Stopped at 10/18/15 2251   ??? calcium acetate (PHOSLO) capsule 667 mg  1 Cap Oral TID WITH MEALS Wanda Plumpirghayu Desai, MD   667 mg at 10/19/15 1230   ??? cloNIDine HCl (CATAPRES) tablet 0.1 mg  0.1 mg Oral BID Wanda Plumpirghayu Desai, MD   0.1 mg at 10/19/15 0858   ??? famotidine (PEPCID) tablet 20 mg  20 mg Oral DAILY Wanda Plumpirghayu Desai, MD   20 mg at 10/19/15 0857   ??? folic acid (FOLVITE) tablet 1 mg  1 mg Oral DAILY Wanda Plump, MD   1 mg at 10/19/15 0857   ??? hydroxyurea (HYDREA) chemo cap 500 mg  500 mg Oral DAILY Wanda Plump, MD   Stopped at 10/19/15 0900   ??? lisinopril (PRINIVIL, ZESTRIL) tablet 10 mg  10 mg Oral DAILY Wanda Plump, MD   10 mg at 10/19/15 0858   ??? diphenhydrAMINE (BENADRYL) injection 12.5 mg  12.5 mg IntraVENous Q6H PRN Wanda Plump, MD   12.5 mg at 10/19/15 0859   ??? heparin (porcine) injection 5,000 Units  5,000 Units SubCUTAneous Q8H Wanda Plump, MD   5,000 Units at 10/19/15 1229   ??? epoetin alfa (EPOGEN;PROCRIT) 5,000 Units  5,000 Units IntraVENous DIALYSIS ONCE Loreli Dollar, MD   Stopped at 10/17/15 1000   ??? sodium citrate 4 gram /100 mL (4 %) 0.08 g  2 mL Hemodialysis DIALYSIS TUE, THU & SAT Loreli Dollar, MD   Stopped at 10/17/15 1000   ??? ondansetron (ZOFRAN) injection 4 mg  4 mg IntraVENous Q6H PRN Ouida Sills, MD   4 mg at 10/18/15 2049        Physical Exam:     Physical Exam:   General:  Alert, cooperative, no distress, appears stated age.   Mouth/Throat: Lips, mucosa, and tongue normal. Teeth and gums normal.   Neck: Supple, symmetrical, trachea midline, no adenopathy, thyroid: no enlargement/tenderness/nodules, no carotid bruit and no JVD.   Lungs:   Clear to auscultation bilaterally.   Heart:  Regular rate and rhythm, S1, S2 normal, no murmur, click, rub or gallop.    Abdomen:   Soft, non-tender. Bowel sounds normal. No masses,  No organomegaly.   Extremities: Extremities normal, atraumatic, no cyanosis or edema, right groin has catheter.         Data Review:    CBC w/Diff    Recent Labs      10/17/15   1505  10/16/15   2051   WBC  2.4*  3.1*   RBC  2.88*  2.99*   HGB  8.7*  9.0*   HCT  27.5*  28.0*   MCV  95.5  93.6   MCH  30.2  30.1   MCHC  31.6  32.1   RDW  18.2*  18.1*    Recent Labs      10/17/15   1505  10/16/15   2051   MONOS  11*  14*   EOS  9*  6*   BASOS  0  0   RDW  18.2*  18.1*        Comprehensive Metabolic Profile    Recent Labs      10/17/15   1505  10/16/15   2051   NA  137  135*   K  5.5  5.3   CL  102  103   CO2  24  23   BUN  75*  71*   CREA  10.60*  10.10*    Recent Labs      10/17/15   1505  10/16/15   2051   CA  7.4*  7.4*   ALB  2.6*   --    TP  8.7*   --    SGOT  29   --    TBILI  0.5   --                         Impression:       Active  Hospital Problems    Diagnosis Date Noted   ??? Secondary hyperparathyroidism of renal origin (HCC) 10/18/2015   ??? HIV (human immunodeficiency virus infection) (HCC) 10/17/2015   ??? Renal failure 10/16/2015   ??? ESRD (end stage renal disease) on dialysis (HCC) 07/06/2012   ??? HTN (hypertension) 07/06/2012   ??? Sickle cell anemia with pain (HCC) 07/05/2012            Plan:     Needs lab today, Dialysis today, await tob seen by ID consultant & Oncologist., will continue to use Right groin catheter.      Loreli Dollar, MD

## 2015-10-19 NOTE — Other (Signed)
ACUTE HEMODIALYSIS FLOW SHEET    HEMODIALYSIS ORDERS: Physician: Jannette Fogo     Dialyzer: Revaclear        Duration: 3.5 hr  BFR: 400   DFR: 800   Dialysate:  Temp 37.0 K+   2.0    Ca+  2.5 Na 140 Bicarb 35   Weight:  102.4 kg    Bed Scale      Unable to Obtain       Dry weight/UF Goal: 2500 Access Femoral Catheter   Needle Gauge     Heparin   Bolus      Units     Hourly       Units    None     Catheter locking solution : Sodium Citrate   Pre BP:   204/92    Pulse:     87     Temperature:   97.8  Respirations: 16  Tx: NS    250 ml/Bolus  Other         N/A   Labs: Pre        Post:         N/A   Additional Orders(medications, blood products, hypotension management):        N/A      Time Out/Safety Check   [facility] Consent Verified     CATHETER ACCESS: N/A   Right   Left   IJ     Fem    First use X-ray verified     Tunnel                 Non Tunneled   No S/S infection  Redness  Drainage Cultured Swelling Pain   Medical Aseptic Prep Utilized   Dressing Changed   Biopatch  Date:    Clotted   Patent   Flows: Good  Poor  Reversed   If access problem, Dr. notified: Yes    N/A  Date:           GRAFT/FISTULA ACCESS:  N/A     Right     Left     UE     LE   AVG   AVF        Buttonhole    Medical Aseptic Prep Utilized   No S/S infection  Redness  Drainage Cultured Swelling Pain    Bruit:    Strong     Weak       Thrill :    Strong     Weak       Needle Gauge:    Length:    If access problem, Dr. notified: Yes     N/A  Date:        Please describe access if present and not used:       GENERAL ASSESSMENT:    LUNGS:  Rate Regular  SaO2%    100   N/A     Clear   Coarse   Crackles   Wheezing         Diminished     Location : RLL   LLL    RUL  LUL   Cough: Productive  Dry  N/A   Respirations:  Easy  Labored   Therapy:  RA  NC  l/min    Mask: NRB Venti       O2%                  Ventilator  Intubated   Trach   BiPaP  CARDIAC: Regular       Irregular    Pericardial Rub   JVD           Monitored   Bedside   Remotely monitored  N/A  Rhythm:    EDEMA:  None  Generalized   Pitting  1     2     3     4                   Facial   Pedal    UE   LE   SKIN:    Warm   Hot      Cold    Dry      Pale    Diaphoretic                   Flushed   Jaundiced   Cyanotic   Rash   Weeping   LOC:     Alert      Oriented:     Person      Place  Time                Confused   Lethargic   Medicated   Non-responsive     GI / ABDOMEN:   Flat     Distended     Soft     Firm     Obese                              Diarrhea   Bowel Sounds   Nausea   Vomiting      GU / URINE ASSESSMENT: Voiding    Oliguria   Anuria     Foley      Incontinent      Incontinent Brief        Bathroom Privileges     PAIN:  0 1  2   3   4   5   6   7   8   9   10             Scale 0-10  Action/Follow Up: 0   MOBILITY:   Amb     Amb/Assist     Bed     Wheelchair   Stretcher      All Vitals and Treatment Details on Attached Flowsheet       Hospital: [facility]        Room # 360      1st Time Acute   Stat   Routine   Urgent      Acute Room    Bedside   ICU/CCU   ER   Isolation Precautions:   Dialysis    Airborne    Contact     Reverse   Special Considerations:          Blood Consent Verified N/A     ALLERGIES:    NKA          Code Status:   Full Code   DNR   Other           HBsAg ONLY: Date Drawn    10/17/15      Negative Positive Unknown   HBsAb: Date 10/17/15     Susceptible    Immune10 Not Drawn   Drawn     Current Labs:    Date of Labs:         Today  Results for Tyler Ballard, Tyler Ballard (MRN [id number]) as of 10/19/2015 19:11   Ref. Range 10/17/2015 15:05   Hepatitis B surface Ag Latest Ref Range: <1.00 Index <0.10   Hep B surface Ag Interp. Latest Ref Range: NEG   NEGATIVE   Hepatitis B surface Ab Latest Ref Range: >10.0 mIU/mL 5.54 (L)   Hep B surface Ab Interp. Latest Ref Range: POS   NEGATIVE (A)   Hep B surface Ab comment Latest Units:   Samples with a  v...   Results for Tyler Ballard, Tyler Ballard (MRN [id number]) as of 10/19/2015 19:11    Ref. Range 10/19/2015 17:30 10/19/2015 17:30 10/19/2015 17:30 10/19/2015 17:30   WBC Latest Ref Range: 4.6 - 13.2 K/uL 1.8 (L)      RBC Latest Ref Range: 4.70 - 5.50 M/uL 2.71 (L)      HGB Latest Ref Range: 13.0 - 16.0 g/dL 8.1 (L)      HCT Latest Ref Range: 36.0 - 48.0 % 26.6 (L)      MCV Latest Ref Range: 74.0 - 97.0 FL 98.2 (H)      MCH Latest Ref Range: 24.0 - 34.0 PG 29.9      MCHC Latest Ref Range: 31.0 - 37.0 g/dL 30.5 (L)      RDW Latest Ref Range: 11.6 - 14.5 % 17.9 (H)      PLATELET Latest Ref Range: 135 - 420 K/uL 85 (L)      MPV Latest Ref Range: 9.2 - 11.8 FL 11.1      NEUTROPHILS Latest Units: % PENDING      LYMPHOCYTES Latest Units: % PENDING      MONOCYTES Latest Units: % PENDING      EOSINOPHILS Latest Units: % PENDING      BASOPHILS Latest Units: % PENDING      DF Unknown PENDING      ABS. NEUTROPHILS Latest Units: K/UL PENDING      ABS. LYMPHOCYTES Latest Units: K/UL PENDING      ABS. MONOCYTES Latest Units: K/UL PENDING      ABS. EOSINOPHILS Latest Units: K/UL PENDING      ABS. BASOPHILS Latest Units: K/UL PENDING      RBC COMMENTS Latest Units:   TEARDROP CELLS... OVALOCYTES... POIKILOCYTOSIS... ANISOCYTOSIS...   PLATELET COMMENTS Latest Units:   DECREASED PLATELETS      INR Latest Ref Range: 0.8 - 1.2   1.3 (H)      Prothrombin time Latest Ref Range: 11.5 - 15.2 sec 15.6 (H)      Sodium Latest Ref Range: 136 - 145 mmol/L 138      Potassium Latest Ref Range: 3.5 - 5.5 mmol/L 5.1      Chloride Latest Ref Range: 100 - 108 mmol/L 103      CO2 Latest Ref Range: 21 - 32 mmol/L 25      Anion gap Latest Ref Range: 3.0 - 18 mmol/L 10      Glucose Latest Ref Range: 74 - 99 mg/dL 85      BUN Latest Ref Range: 7.0 - 18 MG/DL 50 (H)      Creatinine Latest Ref Range: 0.6 - 1.3 MG/DL 7.26 (H)      BUN/Creatinine ratio Latest Ref Range: 12 - 20   7 (L)      Calcium Latest Ref Range: 8.5 - 10.1 MG/DL 7.7 (L) 8.0 (L)     Phosphorus Latest Ref Range: 2.5 - 4.9 MG/DL 4.7       GFR est non-AA  Latest Ref Range: >60 ml/min/1.60m2 9 (L)      GFR est AA Latest Ref Range: >60 ml/min/1.70m2 11 (L)      Hemoglobin A1c, (calculated) Latest Ref Range: 4.2 - 5.6 % <3.5 (L)      PTH INTACT Unknown  Rpt (A)     and paste current labs here.                                                                                                                                  DIET:   Renal     Other      NPO       Diabetic      PRIMARY NURSE REPORT: First initial/Last name/Title      Pre Dialysis: Quentin Cornwall    Time: 4315      EDUCATION:     Patient  Other         Knowledge Basis: None Minimal  Substantial   Barriers to learning  N/A    Access Care      S&S of infection      Fluid Management     K+     Procedural    Albumin      Medications      Tx Options      Transplant      Diet      Other   Teaching Tools:   Explain   Demo   Handouts  Video  Patient response:    Verbalized understanding   Teach back   Return demonstration  Requires follow up   Inappropriate due to            [place] ??? Before each treatment:     Machine Number:                   [facility]                                   Unit Machine # 6 with centralized RO                                   Portable Machine #1/RO serial # [id number]                                   Portable Machine #2/RO serial # [id number]                                   Portable Machine #3/RO serial # [id number]  [place]                                   Portable Machine #11/RO serial # [id number]                                    Portable Machine #12/RO serial # [id number]                                   Portable Machine #13/RO serial #  [id number]      Alarm Test:  Pass time 5400         Other:           RO/Machine Log Complete      Temp    37.0            Extracorporeal Circuit Tested for integrity    Dialysate: pH  7.2 Conductivity: Meter   14.0     HD Machine   14.2                   TCD: 14.0  Dialyzer Lot # [id number]            Blood Tubing Lot # [id number]          Saline Lot #  [id number]     CHLORINE TESTING-Before each treatment and every 4 hours    Total Chlorine:  less than 0.1 ppm  Time: 2671  4 Hr/2nd Check Time: 2035   (if greater than 0.1 ppm from Primary then every 30 minutes from Secondary)     TREATMENT INITIATION ??? with Dialysis Precautions:    All Connections Secured                  Saline Line Double Clamped    Venous Parameters Set                   Arterial Parameters Set     Prime Given  250 ml               Air Foam Detector Engaged      Treatment Initiation Note: Hemodialysis tx initiated successfully. No signs and symptoms of acute distress noted at this time. UF goal set at 2500 mL as tolerated. Tx scheduled time 3.5 hours. Accessed right femoral catheter utilizing medically aseptic technique. Both lines patent with good blood flow. Will monitor patient closely throughout tx. Pre-tx report received from Quentin Cornwall, RN.     Medication Dose Volume Route Initials Dialyzer Cleared:  Good  Fair   Poor    Blood processed:  73.5 L  UF Removed  2500 Ml    Post Wt: 98.7    kg  POst BP:   186/82       Pulse: 88      Respirations: 18  Temperature: 98.4      Epogen       5000  u     2 ml          IV      CH Post Tx Vascular Access: AVF/AVG: Bleeding stopped Art n/a  min. Ignacia Bayley   N/A  Catheter: Locking solution: Sodium Citrate Art. 2.3  Ven. 2.3  N/A                                 Post Assessment:                                    Skin:   Warm   Dry  Diaphoretic     Flushed   Pale  Cyanotic   [facility] Signatures Title Initials  Time Lungs:  Clear     Course   Crackles   Wheezing  Diminished   Shon Millet RN [facility] 2130 Cardiac:  Regular    Irregular    Monitor   N/A  Rhythm:           Edema:   None     General      Facial    Pedal     UE     LE        Pain: 0  $'1  2   3  4   5   6   7   8   9   10         'v$ Post Treatment Note: Patient tolerated HD treatment well. Clarification was made regarding the Epogen 5000 units and sodium citrate when the nephrologist was contacted. The nephrologist ordered the Epogen to be given and sodium citrate to be used to pack the dialysis catheters. Patient refused dressing change of the dialysis catheter and stated that it will be changed on wendsday. The catheter has a transparent dressing that is still intact from yesterdays catheter insertion to the right fermural area. UF goal met able to successfully remove 2.5 L's of fluid. Rinsed back blood. Flushed both lines with NS. Instilled prescribed sodium citrate into each lumen. Both lines clamped and capped. Pt and VS stable at this time. Report given to Barb Merino , RN      POST TREATMENT PRIMARY NURSE HANDOFF REPORT:     First initial/Last name/Title         Post Dialysis: Lucilia Sumulong Time:  2130     Abbreviations: AVG-arterial venous graft, AVF-arterial venous fistula, IJ-Internal Jugular, Subcl-Subclavian, Fem-Femoral, Tx-treatment, AP/HR-apical heart rate, DFR-dialysate flow rate, BFR-blood flow rate, AP-arterial pressure, VP-venous pressure, UF-ultrafiltrate, TMP-transmembrane pressure, Ven-Venous, Art-Arterial, RO-Reverse Osmosis

## 2015-10-19 NOTE — Progress Notes (Signed)
NUTRITION    BPA/MST Referral       RECOMMENDATIONS / PLAN:     - Add supplements: Nepro TID      NUTRITION INTERVENTIONS:      Meals/Snacks: Modified diet, monitor po intake    Medical food supplementation    Vitamin and mineral supplementation: folic acid   Nutrition-related medication management: phoslo, SSI, zofran   Coordination of nutrition care   Continue inpatient monitoring and intervention     ASSESSMENT:     Subjective/Objective:  Patient with good appetite consuming 100% of meals. ESRD on hemodialysis.     Diet: DIET RENAL WITH OPTIONS 100-80-80; Regular    Food Allergies: NKFA  Chewing/Swallowing Difficulty:   None known      Yes:  Meal Intake: Patient Vitals for the past 100 hrs:   % Diet Eaten   10/19/15 1344 100 %   10/17/15 1805 100 %   10/17/15 0933 100 %     BM:  10/29  Skin Integrity: WDL; catheter site    Edema: none     Pertinent Medications: Reviewed     Labs:  Recent Labs      10/17/15   1505  10/16/15   2051   NA  137  135*   K  5.5  5.3   CL  102  103   CO2  24  23   GLU  122*  133*   BUN  75*  71*   CREA  10.60*  10.10*   CA  7.4*  7.4*   MG   --   2.2   ALB  2.6*   --    SGOT  29   --    ALT  16   --          Intake/Output Summary (Last 24 hours) at 10/19/15 1450  Last data filed at 10/19/15 1344   Gross per 24 hour   Intake    720 ml   Output      0 ml   Net    720 ml       Anthropometrics:  Ht Readings from Last 1 Encounters:   10/17/15 $RemoveB'5\' 7"'gxNzwgJY$  (1.702 m)     Last 3 Recorded Weights in this Encounter    10/17/15 0433 10/18/15 0357 10/19/15 0640   Weight: 99.6 kg (219 lb 9.3 oz) 102.2 kg (225 lb 5 oz) 99.8 kg (220 lb)     Body mass index is 34.46 kg/(m^2).  Obese, Class I     Weight History: 23 lb, 9% weight loss x month per chart     Weight Metrics 10/19/2015 03/28/2014 11/01/2013 09/29/2013 09/06/2013 09/05/2013 08/06/2013   Weight 220 lb 170 lb 180 lb 180 lb 180 lb 243 lb 222 lb   BMI 34.46 kg/m2 28.29 kg/m2 29.95 kg/m2 29.95 kg/m2 29.95 kg/m2 40.44 kg/m2 -         Admitting Diagnosis: Renal failure  ESRD (end stage renal disease) ([place])  DX  Past Medical History   Diagnosis Date   ??? AVN (avascular necrosis of bone) (HCC)      right hip   ??? Chronic kidney disease    ??? Chronic kidney disease (CKD), stage V ([place])    ??? Dialysis patient [facility])    ??? ESRD on hemodialysis ([place])    ??? HIV (human immunodeficiency virus infection) ([place])    ??? HTN (hypertension) 07/06/2012   ??? HTN (hypertension)    ??? Hypertension    ???  Malingering      Sickle Cell testing negative   ??? Malingering      Patient states has had sickle cell disease since age of 50, hemoglobin electrophoresis at [place] and [facility] both negative for for sickle cell disease   ??? Noncompliance of patient with renal dialysis ([place]) 09/06/2013   ??? Sickle cell anemia (HCC)      NEGATIVE test. Likely patient states this Dx for secondary gain   ??? Sickle cell disease ([place])      TOLD BY PATIENT AND NOT TRUE   ??? Stroke ([place])      2014 pts states he has weakness to right side upper and lower extremities        Education Needs:         None identified   Identified - Not appropriate at this time    Identified and addressed - refer to education log  Learning Limitations:    None identified   Identified    Cultural, religious & ethnic food preferences:   None identified     Identified and addressed     NUTRITION NEEDS & DIAGNOSIS:     Estimated Nutrition Needs:   Calories: 2310-2695 kcal (30-35 kcal/kg) based on   Actual BW:       SBW: 77 kg   Protein: 92-116 gm (1.2-1.5 gm/kg) based on   Actual BW:       SBW: 77 kg  Fluid: 1 mL/kcal     Patient expected to meet estimated nutrient needs:    Yes     No      Nutrition Diagnosis: Increased energy needs related to increased energy expenditure as evidenced by ESRD on HD.      MONITORING & EVALUATION:     Nutrition Goal(s):  1. Po intake of meals will meet >75% of patient estimated nutritional needs within the next 5-7 days.    Previous Goal(s) Outcome (for follow-up assessments only): Goal 1:    Met       Not Met        Previous Recommendations (for follow-up assessments only):        Implemented          Not Implemented (RD to address)      No Recommendation Made     Discharge Planning: renal diet    Participated in care planning, discharge planning, & interdisciplinary rounds as appropriate      Chaney Malling, [place], [place]   Pager: [phone]

## 2015-10-19 NOTE — Progress Notes (Signed)
RENAL PROGRESS NOTE: Pt seen on dialysis.  Follow up of ESRD     Present on Admission:  ??? ESRD (end stage renal disease) on dialysis [facility])  ??? HTN (hypertension)  ??? Sickle cell anemia with pain (HCC)  ??? Secondary hyperparathyroidism of renal origin (HCC)         Subjective: No new complain, glass of Ice next to him.    Patient is on Dialysis.     Objective:    Patient Vitals for the past 12 hrs:   Temp Pulse Resp BP SpO2   10/19/15 1800 - 90 20 136/71 -   10/19/15 1730 - 90 18 183/84 -   10/19/15 1721 - 87 16 (!) 204/92 -   10/19/15 1657 98.6 ??F (37 ??C) 90 20 115/83 100 %   10/19/15 0802 98 ??F (36.7 ??C) 85 19 188/89 94 %        Intake/Output Summary (Last 24 hours) at 10/19/15 1841  Last data filed at 10/19/15 1344   Gross per 24 hour   Intake    840 ml   Output      0 ml   Net    840 ml       Physical Assessment:     General Appearance: NAD  Lung: clear to auscultation  Heart: regular rate and rhythm and no murmurs, clicks or gallops  Lower Extremities: edema   Access: right groin catheter.    Labs    CBC w/Diff    Recent Labs      10/17/15   1505  10/16/15   2051   WBC  2.4*  3.1*   RBC  2.88*  2.99*   HGB  8.7*  9.0*   HCT  27.5*  28.0*   MCV  95.5  93.6   MCH  30.2  30.1   MCHC  31.6  32.1   RDW  18.2*  18.1*    Recent Labs      10/17/15   1505  10/16/15   2051   MONOS  11*  14*   EOS  9*  6*   BASOS  0  0   RDW  18.2*  18.1*        Comprehensive Metabolic Profile    Recent Labs      10/17/15   1505  10/16/15   2051   NA  137  135*   K  5.5  5.3   CL  102  103   CO2  24  23   BUN  75*  71*   CREA  10.60*  10.10*    Recent Labs      10/17/15   1505  10/16/15   2051   CA  7.4*  7.4*   ALB  2.6*   --    TP  8.7*   --    SGOT  29   --    TBILI  0.5   --           Basic Metabolic Profile       results  reviwed.          MEDS:Reviwed.  Current Facility-Administered Medications   Medication Dose Route Frequency Provider Last Rate Last Dose   ??? diphenhydrAMINE (BENADRYL) capsule 25 mg  25 mg Oral Q6H PRN Betsy Pries, MD   25 mg at 10/19/15 1513   ??? ammonium lactate (LAC-HYDRIN) 12 % lotion   Topical BID Betsy Pries, MD  Stopped at 10/19/15 1800   ??? sodium chloride (NS) flush 5-10 mL  5-10 mL IntraVENous PRN Alben DeedsEbony S McNeal, CRNA       ??? naloxone (NARCAN) injection 0.1 mg  0.1 mg IntraVENous PRN Alben DeedsEbony S McNeal, CRNA       ??? glucose chewable tablet 16 g  4 Tab Oral PRN Alben DeedsEbony S McNeal, CRNA       ??? glucagon (GLUCAGEN) injection 1 mg  1 mg IntraMUSCular PRN Alben DeedsEbony S McNeal, CRNA       ??? dextrose (D50W) injection syrg 12.5-25 g  25-50 mL IntraVENous PRN Alben DeedsEbony S McNeal, CRNA       ??? sodium citrate 4 gram /100 mL (4 %) 0.08 g  2 mL Does Not Apply DIALYSIS PRN Loreli DollarMosta Tag Wurtz, MD       ??? acetaminophen (TYLENOL) tablet 500 mg  500 mg Oral Q6H PRN Betsy PriesEmily E Schley, MD       ??? oxyCODONE-acetaminophen (PERCOCET 7.5) 7.5-325 mg per tablet 1 Tab  1 Tab Oral Q6H PRN Edilia BoKabaye Berhanu, MD   1 Tab at 10/19/15 1312   ??? insulin lispro (HUMALOG) injection   SubCUTAneous AC&HS Wanda Plumpirghayu Desai, MD   Stopped at 10/18/15 2251   ??? calcium acetate (PHOSLO) capsule 667 mg  1 Cap Oral TID WITH MEALS Wanda Plumpirghayu Desai, MD   Stopped at 10/19/15 1700   ??? cloNIDine HCl (CATAPRES) tablet 0.1 mg  0.1 mg Oral BID Wanda Plumpirghayu Desai, MD   Stopped at 10/19/15 1800   ??? famotidine (PEPCID) tablet 20 mg  20 mg Oral DAILY Wanda Plumpirghayu Desai, MD   20 mg at 10/19/15 0857   ??? folic acid (FOLVITE) tablet 1 mg  1 mg Oral DAILY Wanda Plumpirghayu Desai, MD   1 mg at 10/19/15 0857   ??? hydroxyurea (HYDREA) chemo cap 500 mg  500 mg Oral DAILY Wanda Plumpirghayu Desai, MD   Stopped at 10/19/15 0900   ??? lisinopril (PRINIVIL, ZESTRIL) tablet 10 mg  10 mg Oral DAILY Wanda Plumpirghayu Desai, MD   10 mg at 10/19/15 0858   ??? heparin (porcine) injection 5,000 Units  5,000 Units SubCUTAneous Q8H Wanda Plumpirghayu Desai, MD   5,000 Units at 10/19/15 1229   ??? epoetin alfa (EPOGEN;PROCRIT) 5,000 Units  5,000 Units IntraVENous DIALYSIS ONCE Loreli DollarMosta Mirra Basilio, MD   Stopped at 10/17/15 1000    ??? sodium citrate 4 gram /100 mL (4 %) 0.08 g  2 mL Hemodialysis DIALYSIS TUE, THU & SAT Loreli DollarMosta Kinga Cassar, MD   Stopped at 10/17/15 1000   ??? ondansetron (ZOFRAN) injection 4 mg  4 mg IntraVENous Q6H PRN Ouida SillsJason L Andre, MD   4 mg at 10/18/15 2049       Impression:    ESRD: Tolerating HD well.  Anemia of CKD: on Epogen.  Hyperparathyroidism Yes    Plan  Dialysis for volume and solute management  Epogen  6000 units.  Hectorol: needs lab.        Loreli DollarMosta Amerie Beaumont, MD

## 2015-10-20 LAB — CBC WITH AUTOMATED DIFF
ABS. BASOPHILS: 0 10*3/uL (ref 0.0–0.06)
ABS. EOSINOPHILS: 0.1 10*3/uL (ref 0.0–0.4)
ABS. LYMPHOCYTES: 0.7 10*3/uL — ABNORMAL LOW (ref 0.8–3.5)
ABS. MONOCYTES: 0.3 10*3/uL (ref 0–1.0)
ABS. NEUTROPHILS: 1.1 10*3/uL — ABNORMAL LOW (ref 1.8–8.0)
BAND NEUTROPHILS: 6 % — ABNORMAL HIGH (ref 0–5)
BASOPHILS: 0 % (ref 0–3)
EOSINOPHILS: 4 % (ref 0–5)
HCT: 25.6 % — ABNORMAL LOW (ref 36.0–48.0)
HGB: 7.7 g/dL — ABNORMAL LOW (ref 13.0–16.0)
LYMPHOCYTES: 32 % (ref 20–51)
MCH: 29.5 PG (ref 24.0–34.0)
MCHC: 30.1 g/dL — ABNORMAL LOW (ref 31.0–37.0)
MCV: 98.1 FL — ABNORMAL HIGH (ref 74.0–97.0)
MONOCYTES: 14 % — ABNORMAL HIGH (ref 2–9)
MPV: 10.2 FL (ref 9.2–11.8)
NEUTROPHILS: 44 % (ref 42–75)
PLATELET COMMENTS: DECREASED
PLATELET: 77 10*3/uL — ABNORMAL LOW (ref 135–420)
RBC: 2.61 M/uL — ABNORMAL LOW (ref 4.70–5.50)
RDW: 17.8 % — ABNORMAL HIGH (ref 11.6–14.5)
TOTAL CELLS COUNTED: 50
WBC: 2.2 10*3/uL — ABNORMAL LOW (ref 4.6–13.2)

## 2015-10-20 LAB — LYMPHOCYTES, CD4 PERCENT AND ABSOLUTE
% CD 4 Pos Lymph: 17.8 % — ABNORMAL LOW (ref 30.8–58.5)
ABS. BASOPHILS: 0 10*3/uL (ref 0.0–0.2)
ABS. EOSINOPHILS: 0.2 10*3/uL (ref 0.0–0.4)
ABS. IMM. GRANS.: 0 10*3/uL (ref 0.0–0.1)
ABS. MONOCYTES: 0.3 10*3/uL (ref 0.1–0.9)
ABS. NEUTROPHILS: 1.2 10*3/uL — ABNORMAL LOW (ref 1.4–7.0)
Abs CD4 Helper: 125 /uL — ABNORMAL LOW (ref 359–1519)
Abs Lymphocytes: 0.7 10*3/uL (ref 0.7–3.1)
BASOPHILS: 0 %
EOSINOPHILS: 6 %
HCT: 27.7 % — ABNORMAL LOW (ref 37.5–51.0)
HGB: 8.6 g/dL — ABNORMAL LOW (ref 12.6–17.7)
IMMATURE GRANULOCYTES: 0 %
Lymphocytes: 29 %
MCH: 30.3 pg (ref 26.6–33.0)
MCHC: 31 g/dL — ABNORMAL LOW (ref 31.5–35.7)
MCV: 98 fL — ABNORMAL HIGH (ref 79–97)
MONOCYTES: 11 %
NEUTROPHILS: 54 %
NRBC: 0 % (ref 0–0)
PLATELET: 108 10*3/uL — ABNORMAL LOW (ref 150–379)
RBC: 2.84 x10E6/uL — ABNORMAL LOW (ref 4.14–5.80)
RDW: 17.8 % — ABNORMAL HIGH (ref 12.3–15.4)
WBC: 2.3 10*3/uL — ABNORMAL LOW (ref 3.4–10.8)

## 2015-10-20 LAB — METABOLIC PANEL, BASIC
Anion gap: 9 mmol/L (ref 3.0–18)
BUN/Creatinine ratio: 6 — ABNORMAL LOW (ref 12–20)
BUN: 35 MG/DL — ABNORMAL HIGH (ref 7.0–18)
CO2: 25 mmol/L (ref 21–32)
Calcium: 8.3 MG/DL — ABNORMAL LOW (ref 8.5–10.1)
Chloride: 104 mmol/L (ref 100–108)
Creatinine: 5.54 MG/DL — ABNORMAL HIGH (ref 0.6–1.3)
GFR est AA: 14 mL/min/{1.73_m2} — ABNORMAL LOW (ref >60)
GFR est non-AA: 12 mL/min/{1.73_m2} — ABNORMAL LOW (ref >60)
Glucose: 116 mg/dL — ABNORMAL HIGH (ref 74–99)
Potassium: 4.8 mmol/L (ref 3.5–5.5)
Sodium: 138 mmol/L (ref 136–145)

## 2015-10-20 LAB — PTT: aPTT: 40.2 s — ABNORMAL HIGH (ref 23.0–36.4)

## 2015-10-20 LAB — PROTHROMBIN TIME + INR
INR: 1.3 — ABNORMAL HIGH (ref 0.8–1.2)
Prothrombin time: 15.4 s — ABNORMAL HIGH (ref 11.5–15.2)

## 2015-10-20 MED ORDER — TRIMETHOPRIM-SULFAMETHOXAZOLE 80 MG-400 MG TAB
80-400 mg | ORAL | Status: DC
Start: 2015-10-20 — End: 2015-10-21

## 2015-10-20 MED ORDER — HYDRALAZINE 20 MG/ML IJ SOLN
20 mg/mL | Freq: Four times a day (QID) | INTRAMUSCULAR | Status: DC | PRN
Start: 2015-10-20 — End: 2015-10-20

## 2015-10-20 MED ORDER — HYDRALAZINE 20 MG/ML IJ SOLN
20 mg/mL | Freq: Four times a day (QID) | INTRAMUSCULAR | Status: DC | PRN
Start: 2015-10-20 — End: 2015-10-20
  Administered 2015-10-20 (×2): via INTRAVENOUS

## 2015-10-20 MED ORDER — FENTANYL CITRATE (PF) 50 MCG/ML IJ SOLN
50 mcg/mL | INTRAMUSCULAR | Status: DC | PRN
Start: 2015-10-20 — End: 2015-10-20
  Administered 2015-10-20: 16:00:00 via INTRAVENOUS

## 2015-10-20 MED ORDER — HEPARIN (PORCINE) 5,000 UNIT/ML IJ SOLN
5000 unit/mL | INTRAMUSCULAR | Status: AC
Start: 2015-10-20 — End: 2015-10-20
  Administered 2015-10-20: 16:00:00

## 2015-10-20 MED ORDER — AZITHROMYCIN 600 MG TAB
600 mg | ORAL | Status: DC
Start: 2015-10-20 — End: 2015-10-21
  Administered 2015-10-20: 22:00:00 via ORAL

## 2015-10-20 MED ORDER — LIDOCAINE (PF) 10 MG/ML (1 %) IJ SOLN
10 mg/mL (1 %) | INTRAMUSCULAR | Status: DC | PRN
Start: 2015-10-20 — End: 2015-10-20
  Administered 2015-10-20: 16:00:00 via SUBCUTANEOUS

## 2015-10-20 MED ORDER — MIDAZOLAM 1 MG/ML IJ SOLN
1 mg/mL | INTRAMUSCULAR | Status: DC | PRN
Start: 2015-10-20 — End: 2015-10-20

## 2015-10-20 MED ORDER — HYDRALAZINE 20 MG/ML IJ SOLN
20 mg/mL | Freq: Four times a day (QID) | INTRAMUSCULAR | Status: DC | PRN
Start: 2015-10-20 — End: 2015-11-03
  Administered 2015-11-01 – 2015-11-03 (×2): via INTRAVENOUS

## 2015-10-20 MED ORDER — HYDROMORPHONE (PF) 1 MG/ML IJ SOLN
1 mg/mL | Freq: Once | INTRAMUSCULAR | Status: AC
Start: 2015-10-20 — End: 2015-10-20
  Administered 2015-10-20: 21:00:00 via INTRAVENOUS

## 2015-10-20 MED ORDER — HEPARIN (PORCINE) IN NS (PF) 1,000 UNIT/500 ML IV
1000 unit/500 mL | INTRAVENOUS | Status: AC
Start: 2015-10-20 — End: 2015-10-21

## 2015-10-20 MED ORDER — HEPARIN (PORCINE) IN NS (PF) 1,000 UNIT/500 ML IV
1000 unit/500 mL | Freq: Once | INTRAVENOUS | Status: AC
Start: 2015-10-20 — End: 2015-10-20
  Administered 2015-10-20: 16:00:00

## 2015-10-20 MED ORDER — HYDROMORPHONE (PF) 1 MG/ML IJ SOLN
1 mg/mL | Freq: Once | INTRAMUSCULAR | Status: AC
Start: 2015-10-20 — End: 2015-10-20

## 2015-10-20 MED ORDER — HEPARIN (PORCINE) 5,000 UNIT/ML IJ SOLN
5000 unit/mL | Freq: Once | INTRAMUSCULAR | Status: AC
Start: 2015-10-20 — End: 2015-10-20

## 2015-10-20 MED ORDER — CLONIDINE 0.1 MG TAB
0.1 mg | Freq: Two times a day (BID) | ORAL | Status: DC
Start: 2015-10-20 — End: 2015-10-25
  Administered 2015-10-20 – 2015-10-25 (×8): via ORAL

## 2015-10-20 MED ORDER — HYDROMORPHONE (PF) 1 MG/ML IJ SOLN
1 mg/mL | INTRAMUSCULAR | Status: AC
Start: 2015-10-20 — End: 2015-10-20
  Administered 2015-10-20: 15:00:00 via INTRAVENOUS

## 2015-10-20 MED FILL — MIDAZOLAM 1 MG/ML IJ SOLN: 1 mg/mL | INTRAMUSCULAR | Qty: 2

## 2015-10-20 MED FILL — LIDOCAINE (PF) 10 MG/ML (1 %) IJ SOLN: 10 mg/mL (1 %) | INTRAMUSCULAR | Qty: 30

## 2015-10-20 MED FILL — LISINOPRIL 10 MG TAB: 10 mg | ORAL | Qty: 1

## 2015-10-20 MED FILL — OXYCODONE-ACETAMINOPHEN 7.5 MG-325 MG TAB: ORAL | Qty: 1

## 2015-10-20 MED FILL — HYDROMORPHONE (PF) 1 MG/ML IJ SOLN: 1 mg/mL | INTRAMUSCULAR | Qty: 1

## 2015-10-20 MED FILL — HEPARIN (PORCINE) IN NS (PF) 1,000 UNIT/500 ML IV: 1000 unit/500 mL | INTRAVENOUS | Qty: 500

## 2015-10-20 MED FILL — TRIMETHOPRIM-SULFAMETHOXAZOLE 80 MG-400 MG TAB: 80-400 mg | ORAL | Qty: 1

## 2015-10-20 MED FILL — CLONIDINE 0.1 MG TAB: 0.1 mg | ORAL | Qty: 1

## 2015-10-20 MED FILL — AZITHROMYCIN 600 MG TAB: 600 mg | ORAL | Qty: 2

## 2015-10-20 MED FILL — DIPHENHYDRAMINE 25 MG CAP: 25 mg | ORAL | Qty: 1

## 2015-10-20 MED FILL — CLONIDINE 0.1 MG TAB: 0.1 mg | ORAL | Qty: 2

## 2015-10-20 MED FILL — HEPARIN (PORCINE) 5,000 UNIT/ML IJ SOLN: 5000 unit/mL | INTRAMUSCULAR | Qty: 1

## 2015-10-20 MED FILL — CALCIUM ACETATE 667 MG CAP: 667 mg | ORAL | Qty: 1

## 2015-10-20 MED FILL — HEPARIN (PORCINE) 5,000 UNIT/ML IJ SOLN: 5000 unit/mL | INTRAMUSCULAR | Qty: 2

## 2015-10-20 MED FILL — HYDRALAZINE 20 MG/ML IJ SOLN: 20 mg/mL | INTRAMUSCULAR | Qty: 1

## 2015-10-20 MED FILL — FAMOTIDINE 20 MG TAB: 20 mg | ORAL | Qty: 1

## 2015-10-20 MED FILL — ONDANSETRON (PF) 4 MG/2 ML INJECTION: 4 mg/2 mL | INTRAMUSCULAR | Qty: 2

## 2015-10-20 MED FILL — HYDROXYUREA 500 MG CAPSULE: 500 mg | ORAL | Qty: 1

## 2015-10-20 MED FILL — FOLIC ACID 1 MG TAB: 1 mg | ORAL | Qty: 1

## 2015-10-20 MED FILL — FENTANYL CITRATE (PF) 50 MCG/ML IJ SOLN: 50 mcg/mL | INTRAMUSCULAR | Qty: 2

## 2015-10-20 NOTE — Progress Notes (Signed)
Rapid Response Note  [facility]    Patient: Tyler Ballard 34 y.o. male  [id number]  [birth year]      Admit Date: 10/16/2015   Admission Diagnosis: Renal failure  ESRD (end stage renal disease) (HCC)  DX    RAPID RESPONSE     Rapid response called for active bleeding from femoral dialysis cath. Pt states that he felt a warm, pulsating sensation from his leg where is cath was located and noticed that he was actively bleeding. The nurse continued to actively hold pressure on the site. Dr. Concha SeK Patel arrived shortly after we did for the rapid response and recommended calling vascular surgery STAT. The vascular surgery PA, [facility]Christy Wild, showed up and initially thought there was just bleeding around the site and after putting in a stitch there was continued bleeding and patient needed to be taken down to surgery. Vitals were ordered and patient was found to have a BP >200/90 and prn order for hydralazine was verbalized.     Medications Reviewed    OBJECTIVE     Visit Vitals   ??? BP (!) 193/97 (BP 1 Location: Left arm, BP Patient Position: At rest)   ??? Pulse 83   ??? Temp 97.8 ??F (36.6 ??C)   ??? Resp 18   ??? Ht 5\' 7"  (1.702 m)   ??? Wt 97.5 kg (215 lb)   ??? SpO2 98%   ??? BMI 33.67 kg/m2       PHYSICAL:  General:  Alert and Responsive and in no acute distress.   CV:  RRR, no murmurs, rubs, or gallops.    RESP:  Unlabored breathing.  Lungs clear to auscultation. no wheeze, rales, or rhonchi.  Equal expansion bilaterally.    ABD:  Soft, nontender, nondistended.    Ext: Active bleeding from temporary femoral dialysis catheter on Rt side    Medications administered: Hydralazine 10 mg IV    Labs: CBC, Coags    ASSESSMENT, PLAN & DISPOSITION   Tyler Ballard is a 34 y.o. year old male admitted for Renal failure  ESRD (end stage renal disease) (HCC)  DX. Rapid response called for active bleeding from femoral dialysis cath. Patient condition currently: stable.      Pt appeared stable, Vascular surgery was present and were managing the bleeding.       Disposition: Taken to surgery    Attending Dr. Sherlynn Stallsalur notified of rapid response. In agreement with plan. Primary team resuming care.       Wilhelmina Mcardleaniel A Evans Levee, DO   10/20/15 5:36 PM

## 2015-10-20 NOTE — Consults (Signed)
Infectious Disease Consultation Note    Requested by: Dr. Loretha Stapler    Reason: HIV/AIDS     Current abx Prior abx         Lines:       Assessment :    34 y.o. 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     Now admitted with difficulty with dialysis access, missed hemodialysis    Patient has longstanding HIV/AIDS. Per him he was on prophylactic trimeth/sulfa, azithromycin which means that his cd4 count was below 50    Currently no evidence of opportunistic infections noted    Recommendations:    1. Start prophylactic trimeth/sulfa, azithromycin  2. F/u cd4 count  3. Schedule follow up with [facility] for [facility]      Thank you for consultation request. Above plan was discussed in details with patient. Please call me if any further questions or concerns. Will continue to participate in the care of this patient.    HPI:    34 y.o. 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     He was diagnosed with HIV in 2002 and told that he doesn't require treatment. He didn't have regular follow up subsequently. He was seen in [place] in 2013 and f/u with [facility] was supposed to be arranged. It doesn't appear that patient followed up with them. He was recently admitted to hospital in [place] - patient had cd4 count, hiv genotype resistance testing there. He was put on prophylactic bactrim, azithromycin.  He notes that he was brought from a [place] because the jail was not equipped to treat the clogged dyalyisis site. Notes that he has had similar condition with his dialysis access in the past which  was treated by he is last nephrologist at [facility], Dr. Delena Serve. He has history of vascular necrosis and left hip replacement. Notes that he does not produce urine. He lays on 2 pillows at night for comfort. He notes that he has a left chest port which cannot be used for dialysis and is to only be used for vascular access and blood transfusions. He  Presented to the ED on 10/28 for evaluation of nausea/ vomiting. Notes that he has not had dialysis in 2 weeks due to what his jail nurse states is a clogged dialysis access. Per note from the [employer], the patient had a seizure today and was sent for evaluation of apparent seizure. He is supposed to be dialysed today. I have been consulted for further recommendations regarding his HIV.     Patient states that he thinks he acquired HIV through blood transfusion in the 1990's. He denies ivda, homosexual behavior. He denies significant weight loss, drenching night sweats, cough, hemoptysis, visual disturbance, sorethroat, odynophagia. Patient denies headaches, visual disturbances, sore throat, runny nose, earaches, cp, sob, chills, cough, abdominal pain, diarrhea, burning micturition, pain or weakness in extremities. He denies back pain/flank pain.  He denies recent sick contacts. No h/o recent travel. No known h/o MRSA colonization or infection in the past.          Past Medical History   Diagnosis Date   ??? AVN (avascular necrosis of bone) (HCC)  right hip   ??? Chronic kidney disease    ??? Chronic kidney disease (CKD), stage V (HCC)    ??? Dialysis patient [facility])    ??? ESRD on hemodialysis (HCC)    ??? HIV (human immunodeficiency virus infection) (HCC)    ??? HTN (hypertension) 07/06/2012   ??? HTN (hypertension)    ??? Hypertension    ??? Malingering      Sickle Cell testing negative   ??? Malingering      Patient states has had sickle cell disease since age of 66, hemoglobin electrophoresis at [facility] hospital and [facility] both negative for for sickle cell disease    ??? Noncompliance of patient with renal dialysis (HCC) 09/06/2013   ??? Sickle cell anemia (HCC)      NEGATIVE test. Likely patient states this Dx for secondary gain   ??? Sickle cell disease (HCC)      TOLD BY PATIENT AND NOT TRUE   ??? Stroke (HCC)      2014 pts states he has weakness to right side upper and lower extremities        Past Surgical History   Procedure Laterality Date   ??? Hx orthopaedic  2012     Rt hip decompression   ??? Hx orthopaedic       rt hip    ??? Hx vascular access       av  fistula right arm.   ??? Hx orthopaedic       RIGHT HIP   ??? Hx vascular access       old avg left arm, avf right arm   ??? Hx orthopaedic       right hip surgery   ??? Hx orthopaedic       right hip    ??? Vascular surgery procedure unlist       Dialysis Access       Current Discharge Medication List       Details   hydroxyurea (HYDREA) 500 mg capsule Take 500 mg by mouth daily.      folic acid (FOLVITE) 1 mg tablet Take 1 mg by mouth daily.      cloNIDine HCl (CATAPRES) 0.1 mg tablet Take 1 tablet by mouth two (2) times a day.  Qty: 60 tablet, Refills: 0      lisinopril (PRINIVIL, ZESTRIL) 10 mg tablet Take 1 tablet by mouth daily.  Qty: 30 tablet, Refills: 0      famotidine (PEPCID) 20 mg tablet Take 1 tablet by mouth daily.  Qty: 30 tablet, Refills: 0         epoetin alfa (EPOGEN) 10,000 unit/mL injection by SubCUTAneous route once.      calcium acetate (PHOSLO) 667 mg cap Take  by mouth three (3) times daily (with meals).             Current Facility-Administered Medications   Medication Dose Route Frequency   ??? hydrALAZINE (APRESOLINE) 20 mg/mL injection 10 mg  10 mg IntraVENous Q6H PRN   ??? heparinized saline 2 units/mL infusion 1,000 Units  500 mL Irrigation ONCE   ??? fentaNYL citrate (PF) injection 12.5-50 mcg  12.5-50 mcg IntraVENous Multiple   ??? midazolam (VERSED) injection 0.5-2 mg  0.5-2 mg IntraVENous Multiple   ??? lidocaine (PF) (XYLOCAINE) 10 mg/mL (1 %) injection 1-60 mL  1-60 mL SubCUTAneous Multiple    ??? heparin (porcine) injection 5,000 Units  5,000 Units Other ONCE   ??? heparin (porcine) 5,000 unit/mL injection       ???  heparinized saline 2 units/mL 1,000 unit/500 mL infusion       ??? diphenhydrAMINE (BENADRYL) capsule 25 mg  25 mg Oral Q6H PRN   ??? ammonium lactate (LAC-HYDRIN) 12 % lotion   Topical BID   ??? sodium chloride (NS) flush 5-10 mL  5-10 mL IntraVENous PRN   ??? naloxone (NARCAN) injection 0.1 mg  0.1 mg IntraVENous PRN   ??? glucose chewable tablet 16 g  4 Tab Oral PRN   ??? glucagon (GLUCAGEN) injection 1 mg  1 mg IntraMUSCular PRN   ??? dextrose (D50W) injection syrg 12.5-25 g  25-50 mL IntraVENous PRN   ??? sodium citrate 4 gram /100 mL (4 %) 0.08 g  2 mL Does Not Apply DIALYSIS PRN   ??? acetaminophen (TYLENOL) tablet 500 mg  500 mg Oral Q6H PRN   ??? oxyCODONE-acetaminophen (PERCOCET 7.5) 7.5-325 mg per tablet 1 Tab  1 Tab Oral Q6H PRN   ??? insulin lispro (HUMALOG) injection   SubCUTAneous AC&HS   ??? calcium acetate (PHOSLO) capsule 667 mg  1 Cap Oral TID WITH MEALS   ??? cloNIDine HCl (CATAPRES) tablet 0.1 mg  0.1 mg Oral BID   ??? famotidine (PEPCID) tablet 20 mg  20 mg Oral DAILY   ??? folic acid (FOLVITE) tablet 1 mg  1 mg Oral DAILY   ??? hydroxyurea (HYDREA) chemo cap 500 mg  500 mg Oral DAILY   ??? lisinopril (PRINIVIL, ZESTRIL) tablet 10 mg  10 mg Oral DAILY   ??? sodium citrate 4 gram /100 mL (4 %) 0.08 g  2 mL Hemodialysis DIALYSIS TUE, THU & SAT   ??? ondansetron (ZOFRAN) injection 4 mg  4 mg IntraVENous Q6H PRN       Allergies: Review of patient's allergies indicates no known allergies.    Family History   Problem Relation Age of Onset   ??? Hypertension Mother    ??? Cancer Mother    ??? Sickle Cell Anemia Mother    ??? Hypertension Father    ??? Sickle Cell Anemia Father    ??? Sickle Cell Anemia Sister    ??? Sickle Cell Anemia Other      Social History     Social History   ??? Marital status: SINGLE     Spouse name: N/A   ??? Number of children: N/A   ??? Years of education: N/A     Occupational History   ??? Not on file.      Social History Main Topics   ??? Smoking status: Former Smoker   ??? Smokeless tobacco: Not on file   ??? Alcohol use No   ??? Drug use: No   ??? Sexual activity: No     Other Topics Concern   ??? Not on file     Social History Narrative    ** Merged History Encounter **         ** Merged History Encounter **         ** Merged History Encounter **         ** Merged History Encounter **         ** Merged History Encounter **          History   Smoking Status   ??? Former Smoker   Smokeless Tobacco   ??? Not on file        Temp (24hrs), Avg:98.3 ??F (36.8 ??C), Min:97.8 ??F (36.6 ??C), Max:98.6 ??F (37 ??C)    Visit Vitals   ??? BP Marland Kitchen)  206/92   ??? Pulse 90   ??? Temp 97.8 ??F (36.6 ??C)   ??? Resp 19   ??? Ht  (1.702 m)   ??? Wt 97.5 kg (215 lb)   ??? SpO2 97%   ??? BMI 33.67 kg/m2       ROS: 12 point ROS obtained in details. Pertinent positives as mentioned in HPI,   otherwise negative    Physical Exam:    General: Well developed, well nourished male laying on the bed AAOx3 in no acute distress.    General:   awake alert and oriented   HEENT:  Normocephalic, atraumatic, PERRL, EOMI, no scleral icterus or pallor; no conjunctival hemmohage;  nasal and oral mucous are moist and without evidence of lesions. No thrush. Neck supple, no bruits.   Lymph Nodes:   no cervical, axillary or inguinal adenopathy   Lungs:   non-labored, bilaterally clear to aspiration- no crackles wheezes rales or rhonchi   Heart:  RRR, s1 and s2; no murmurs rubs or gallops, no edema, + pedal pulses   Abdomen:  soft, non-distended, active bowel sounds, no hepatomegaly, no splenomegaly. Non-tender   Genitourinary:  deferred   Extremities:   no clubbing, cyanosis; no joint effusions or swelling; Full ROM of all large joints to the upper and lower extremities; muscle mass appropriate for age   Neurologic:  No gross focal sensory abnormalities; 5/5 muscle strength to upper and lower extremities. Speech appropriate. Cranial nerves intact                         Skin:  No rash or ulcers noted   Back:  no spinal or paraspinal muscle tenderness or rigidity, no CVA tenderness     Psychiatric:  No suicidal or homicidal ideations, appropriate mood and affect         Labs: Results:   Chemistry Recent Labs      10/20/15   1105  10/19/15   1730  10/17/15   1505   GLU  116*  85  122*   NA  138  138  137   K  4.8  5.1  5.5   CL  104  103  102   CO2  BUN  35*  50*  75*   CREA  5.54*  7.26*  10.60*   CA  8.3*  8.0*   7.7*  7.4*   AGAP  BUCR  6*  7*  7*   AP   --    --   163*   TP   --    --   8.7*   ALB   --    --   2.6*   GLOB   --    --   6.1*   AGRAT   --    --   0.4*      CBC w/Diff Recent Labs      10/20/15   1105  10/19/15   1730  10/17/15   1505   WBC  2.2*  1.8*  2.4*   RBC  2.61*  2.71*  2.88*   HGB  7.7*  8.1*  8.7*   HCT  25.6*  26.6*  27.5*   PLT  77*  85*  109*   GRANS  44  52  50   LYMPH  32  30  30   EOS  4  6*  9*      Microbiology No results for input(s): CULT in the last 72 hours.       RADIOLOGY:    All available imaging studies/reports in connect care for this admission were reviewed    Dr. Raiford SimmondsManali Kamsiyochukwu Buist, Infectious Disease Specialist  [phone]  October 20, 2015  12:12 PM

## 2015-10-20 NOTE — Other (Incomplete)
IDR/SLIDR Summary          Patient: Delane Gingerlbert C Nurse MRN: [id number]    Age: 34 y.o.     Birthdate: Nov 18, 1981 Room/Bed: 360/01   Admit Diagnosis: Renal failure  ESRD (end stage renal disease) (HCC)  DX  Principal Diagnosis: <principal problem not specified>     Goals: ***  Readmission: {YES/NO:18482}  Quality Measure: {Quality Measures:21346}  VTE Prophylaxis: {VTE Prophylaxis:21798}  Influenza Vaccine screening completed? {YES/NO:18482}  Pneumococcal Vaccine screening completed? {YES/NO:18482}  Mobility needs: {Yes/No Caps:22126}   Nutrition plan:{Yes/No Caps:22126}  Consults:{Consult needed:21347}    Financial concerns:{Yes/No Caps:22126}  Escalated to CM? {YES/NO:18482}  RRAT Score: ***   Interventions:{Intervention:21348}  Testing due for pt today? {YES/NO:18482}  LOS: 4 days Expected length of stay *** days  Discharge plan: ***   PCP: Phys Other, MD  Transportation needs: {Yes/No Caps:22126}    Days before discharge:{Days before Discharge:21344}  Discharge disposition: {Discharge Destination:18313::"Home"}    Signed:     Juanna Caorystal R Sharps, RN  10/20/2015  8:24 AM

## 2015-10-20 NOTE — Other (Signed)
TRANSFER - OUT REPORT:    Verbal report given to Crystal RN(name) on [name]  being transferred to [facility] North(unit) for routine progression of care       Report consisted of patient???s Situation, Background, Assessment and   Recommendations(SBAR).     Information from the following report(s) SBAR, Procedure Summary, Intake/Output and MAR was reviewed with the receiving nurse.    Lines:   Double Lumen Hickman 09/04/13 Left Other(comment) (Active)   Central Line Being Utilized No 10/19/2015  9:42 PM   Site Assessment Clean, dry, & intact 10/19/2015  9:42 PM   Dressing Status Clean, dry, & intact 10/18/2015  8:25 PM   Dressing Type 4 X 4 10/18/2015  8:25 PM       Peripheral IV 10/17/15 Right Forearm (Active)   Site Assessment Clean, dry, & intact 10/19/2015  9:42 PM   Phlebitis Assessment 0 10/19/2015  9:42 PM   Infiltration Assessment 0 10/19/2015  9:42 PM   Dressing Status Clean, dry, & intact 10/19/2015  9:42 PM   Dressing Type Transparent 10/19/2015  9:42 PM   Hub Color/Line Status Pink 10/19/2015  9:42 PM        Opportunity for questions and clarification was provided.      Patient transported with:   Registered Nurse

## 2015-10-20 NOTE — Other (Addendum)
Assumed patient care. Patient in bed, awake, alert and oriented x4. Denies pain or discomforts at this time. Dressing to right groin, clean , dry, intact. LandJail officer at bedside. Call light placed within reach. Bed in low position.  2054-Call placed to Dr.Tereffe due to patient c/o nausea and pain, not relived by zofran and percocet per pt. Obtained new orders for phenergan and Morphine IV. Pt notified, agrees with the plan.  0715-Pt called staff to bedside due to right groin bleeding. Noted catheter was oozing blood. Hemostat applied and paged MD, [facility]. STAT orders for H/H placed. Called lab to draw.  0720-Tracy K. PA, at bedside to assess pt for further intervention. Lab tech at bedside drawing blood.  0730- PA French Anaracy and Dr. Ronne BinningMcKenzie at bedside to assess pt groin and bleeding.  0740-Bedside shift change report given to C.Sharps (Cabin crewoncoming nurse) by Andria MeuseA Hoby Kawai RN (offgoing nurse).  Report given with SBAR, Kardex, Intake/Output, MAR and Recent Results.

## 2015-10-20 NOTE — Other (Signed)
TRANSFER - IN REPORT:    Verbal report received from Crystal, Rn(name) on Calpine CorporationElbert C Yore  being received from [facility](unit) for ordered procedure      Report consisted of patient???s Situation, Background, Assessment and   Recommendations(SBAR).     Information from the following report(s) SBAR, Intake/Output and MAR was reviewed with the receiving nurse.    Opportunity for questions and clarification was provided.      Assessment completed upon patient???s arrival to unit and care assumed.

## 2015-10-20 NOTE — Progress Notes (Signed)
Called by RN due to patient bleeding from right femoral TDC.   Rapid response called. Medical team in room upon my arrival.   VSS, pt was hypertensive. Pt feeling fine except for headache. Pressure being held to catheter site.   Upon assessment there was rapid bleeding from catheter site- source of bleeding difficult to assess due to amount of bleeding.  Two stitches placed at catheter insertion site and bleeding appeared to stop however we then saw further bleeding which ultimately was determined to be coming from the catheter itself (arterial lumen) where it appeared catheter was almost completely cracked in half.  Catheter was clamped with hemostat at the insertion site and all bleeding stopped.   Dr Lorine Bearsamacho entered room and patient to go to cath lab for new catheter placement.   Pt tolerated event well. IV dilaudid was given for pain and IV zofran for nausea.     C.H. Robinson WorldwideChristy Calogero Geisen PA-C  [phone]

## 2015-10-20 NOTE — Progress Notes (Signed)
Pt felt wetness and warmth in rt groin area. When he lifted the covers he noted a pool of blood on the bed and bleeding from right groin dialysis catheter. Staff was called and immediate pressure was applied to site. Blood was immediately saturating the gauze so a RRT was called.

## 2015-10-20 NOTE — Progress Notes (Signed)
Chaplain responded to Rapid Response for  Tyler Ballard, who is a 33 y.o.,male,     The Chaplain provided the following Interventions:  Provided crisis spiritual care and support.   Chart reviewed.    Assessment:  There are no further spiritual or religious issues which require intervention at this time.     Plan:  Chaplains will continue to follow and will provide spiritual care as needed.  Chaplain recommends bedside caregivers page chaplain on duty if patient or family shows signs of acute spiritual or emotional distress.     Chaplain Eilene GhaziKerry Pokines   Board Certified Chaplain   Spiritual Care   [phone]

## 2015-10-20 NOTE — Op Note (Signed)
[facility]                               [address] [place], [place] [address]                                 OPERATIVE REPORT    PATIENT:     Tyler Ballard, Tyler Ballard  MRN:            [id number]   DATE:   10/20/2015  BILLING:     [id number]  ROOM:        [id number]  ATTENDING:   Wanda Plump, MD  DICTATING:   Margaretmary Dys, MD      PREOPERATIVE DIAGNOSIS: End-stage renal disease with bleeding dialysis  catheter.    POSTOPERATIVE DIAGNOSIS: End-stage renal disease with bleeding dialysis  catheter.    CULTURES: None.    SPECIMENS REMOVED: None.    DRAINS: None.    ESTIMATED BLOOD LOSS: Less than 50 mL.    ANESTHESIA:    PROCEDURES PERFORMED: Removal of previous right common femoral vein  tunneled dialysis catheter and placement of a new 33 cm Palindrome tunneled  dialysis catheter over wire.    INDICATIONS FOR THE PROCEDURE: The patient is a 34 year old gentleman with  a right groin tunneled dialysis catheter. Unfortunately, he started  bleeding from the catheter. There was an actual hole within the catheter  piece so he is in need of a new dialysis catheter. The patient was given  the risks and benefits of the procedure including but not limited to  bleeding, infection, damage to adjacent structures, MI, stroke, and death,  as well as loss of dialysis access. The patient was understanding of all  the risks and underwent the procedure.    DESCRIPTION OF PROCEDURE: The patient was correctly identified in the  precatheterization area and taken to cath lab in stable condition. The  patient had a pre-incision time-out prior to any incision. The patient was  prepped and draped in a normal sterile fashion according to CDC guidelines  aseptic technique. We then were able take an Amplatz Super Stiff wire and  go through the blue port of the previous catheter. Gave 10 mL of 1%  lidocaine around the catheter itself, removed the catheter over a wire,  and  then placed the new 33 cm Palindrome catheter with its stiffeners over the  wire, removed the wire, removed the stiffeners. The cuff was approximately  1-2 cm within the patient. The tip of the catheter was at the sinoatrial  junction. We then were able to flush and aspirate both ports, placed 2.3 mL  of hot heparin into each port, capped them and wrapped them with a 4 x 4,  secured the catheter with 2-0 Prolene suture at both butterfly holes as  well as the catheter insertion site. Biopatch and Tegaderm were then  placed. The patient tolerated the procedure well without any issues.                     Margaretmary Dys, MD    :wmx  D: 10/20/2015 T: 10/20/2015 09:50 P  Job: [id number]  CScriptDoc #: [id number]  cc:   Margaretmary Dys, MD  Wanda PlumpIRGHAYU DESAI, MD

## 2015-10-20 NOTE — Progress Notes (Signed)
Needs to address his (L) IJ catheter & if his AVG can be declotted. Vascular need to address if left IJ catheter can be used for his dialysis or not.

## 2015-10-20 NOTE — Interval H&P Note (Signed)
H&P Update:  Delane Gingerlbert C Barcomb was seen and examined.  History and physical has been reviewed. Significant clinical changes have occurred as noted:  Bleeding from catheter hole needs new catheter.    Signed By: Oneita HurtMarc A Lilja Soland, MD     October 20, 2015 12:11 PM

## 2015-10-20 NOTE — Other (Signed)
Bedside and Verbal shift change report given to Mudlogger (oncoming nurse) by Leitha Bleak Sumulong (offgoing nurse). Report included the following information SBAR, Kardex, MAR and Recent Results.    SITUATION:   ? Code Status: Full Code  ? Reason for Admission: Renal failure  ? ESRD (end stage renal disease) (HCC)  ? DX    ? Hospital day: 4  ? Problem List:       Hospital Problems  Date Reviewed: 2015/10/21          Codes Class Noted POA    Secondary hyperparathyroidism of renal origin [facility]) ICD-10-CM: N25.81  ICD-9-CM: 588.81  2015-10-21 Yes        HIV (human immunodeficiency virus infection) (HCC) ICD-10-CM: Z21  ICD-9-CM: V08  10/17/2015 Unknown        Renal failure ICD-10-CM: N19  ICD-9-CM: 586  10/16/2015 Unknown        ESRD (end stage renal disease) on dialysis [facility]) (Chronic) ICD-10-CM: N18.6, Z99.2  ICD-9-CM: 585.6, V45.11  07/06/2012 Yes        HTN (hypertension) (Chronic) ICD-10-CM: I10  ICD-9-CM: 401.9  07/06/2012 Yes        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 AVN (avascular necrosis of bone) (HCC)      right hip   ??? Chronic kidney disease    ??? Chronic kidney disease (CKD), stage V (HCC)    ??? Dialysis patient [facility])    ??? ESRD on hemodialysis (HCC)    ??? HIV (human immunodeficiency virus infection) (HCC)    ??? HTN (hypertension) 07/06/2012   ??? HTN (hypertension)    ??? Hypertension    ??? Malingering      Sickle Cell testing negative   ??? Malingering      Patient states has had sickle cell disease since age of 64, hemoglobin electrophoresis at [facility] hospital and [facility] both negative for for sickle cell disease   ??? Noncompliance of patient with renal dialysis (HCC) 09/06/2013   ??? Sickle cell anemia (HCC)      NEGATIVE test. Likely patient states this Dx for secondary gain   ??? Sickle cell disease (HCC)      TOLD BY PATIENT AND NOT TRUE   ??? Stroke (HCC)       2014 pts states he has weakness to right side upper and lower extremities          Patient taking anticoagulants no     ASSESSMENT:   ? Changes in Assessment Throughout Shift: none    ? Patient has Central Line: yes Reasons if yes: dialysis  ? Patient has Foley Cath: no Reasons if yes: na     ? Last Vitals:     Vitals:    10/20/15 0310   BP: 184/82   Pulse: 85   Resp: 18   Temp: 98.2 ??F (36.8 ??C)   TempSrc:    SpO2: 96%   Weight: 97.5 kg (215 lb)   Height:        ? IV and DRAINS (will only show if present)   Double Lumen Hickman 09/04/13 Left Other(comment)-Site Assessment: Clean, dry, & intact  Peripheral IV 10/17/15 Right Forearm-Site Assessment: Clean, dry, & intact  Hemodialysis Catheter 2015-10-21-Site Assessment: Clean, dry, & intact    ?  WOUND (if present)   Wound Type:  none   Dressing present Dressing Present : No   Wound Concerns/Notes:  none    ? PAIN    Pain Assessment    Pain Intensity 1: 0 (10/20/15 0208)    Pain Location 1: Groin    Pain Intervention(s) 1: Medication (see MAR)    Patient Stated Pain Goal: 0  o Interventions for Pain:  Yes/norco  o Intervention effective: yes  o Time of last intervention: see MAR   o Reassessment Completed: yes     ? Last 3 Weights:  Last 3 Recorded Weights in this Encounter    10/18/15 0357 10/19/15 0640 10/20/15 0310   Weight: 102.2 kg (225 lb 5 oz) 99.8 kg (220 lb) 97.5 kg (215 lb)     Weight change: -2.268 kg (-5 lb)    ? INTAKE/OUPUT    Current Shift: 10/31 1901 - 11/01 0700  In: 300 [P.O.:300]  Out: 2500     Last three shifts: 10/30 0701 - 10/31 1900  In: 940 [P.O.:840; I.V.:100]  Out: 0     ? LAB RESULTS     Recent Labs      10/19/15   1730  10/17/15   1505   WBC  1.8*  2.4*   HGB  8.1*  8.7*   HCT  26.6*  27.5*   PLT  85*  109*        Recent Labs      10/19/15   1730  10/18/15   0745  10/17/15   1505   NA  138   --   137   K  5.1   --   5.5   GLU  85   --   122*   BUN  50*   --   75*   CREA  7.26*   --   10.60*   CA  8.0*   7.7*   --   7.4*    INR  1.3*  1.3*   --        RECOMMENDATIONS AND DISCHARGE PLANNING     1. Pending tests/procedures/ Plan of Care or Other Needs: continue to monitor     2. Discharge plan for patient and Needs/Barriers: HRRJ    3. Estimated Discharge Date: pending Posted on Whiteboard in Patient???s Room: yes      4. The patient's care plan was reviewed with the oncoming nurse.       "HEALS" SAFETY CHECK      Fall Risk    Total Score: 2    Safety Measures: Safety Measures: Bed/Chair-Wheels locked, Bed in low position, Call light within reach, Family at bedside, Gripper socks, Other (comment)    A safety check occurred in the patient's room between off going nurse and oncoming nurse listed above.    The safety check included the below items  Area Items   H  High Alert Medications ? Verify all high alert medication drips (heparin, PCA, etc.)   E  Equipment ? Suction is set up for ALL patients (with yanker)  ? Red plugs utilized for all equipment (IV pumps, etc.)  ? WOW???s wiped down at end of shift.  ? Room stocked with oxygen, suction, and other unit-specific supplies   A  Alarms ? Bed alarm is set for fall risk patients  ? Ensure chair alarm is in place and activated if patient is up in a chair   L  Lines ? Check IV  for any infiltration  ? Foley bag is empty if patient has a Foley   ? Tubing and IV bags are labeled   S  Safety   ? Room is clean, patient is clean, and equipment is clean.  ? Hallways are clear from equipment besides carts.   ? Fall bracelet on for fall risk patients  ? Ensure room is clear and free of clutter  ? Suction is set up for ALL patients (with yanker)  ? Hallways are clear from equipment besides carts.   ? Isolation precautions followed, supplies available outside room, sign posted     Haze BoydenLucila M Sumulong  '

## 2015-10-20 NOTE — Op Note (Signed)
[facility]                               [address] [place], [place] [address]                                 OPERATIVE REPORT    PATIENT:     Tyler Ballard, Tyler Ballard  MRN:            [id number]   DATE:   10/20/2015  BILLING:     [id number]  ROOM:        [id number]  ATTENDING:   Wanda Plump, MD  DICTATING:   Margaretmary Dys, MD      PREOPERATIVE DIAGNOSIS: End-stage renal disease with bleeding dialysis  catheter.    POSTOPERATIVE DIAGNOSIS: End-stage renal disease with bleeding dialysis  catheter.    CULTURES: None.    SPECIMENS REMOVED: None.    DRAINS: None.    ESTIMATED BLOOD LOSS: Less than 50 mL.    ANESTHESIA:    PROCEDURES PERFORMED: Removal of previous right common femoral vein  tunneled dialysis catheter and placement of a new 33 cm Palindrome tunneled  dialysis catheter over wire.    INDICATIONS FOR THE PROCEDURE: The patient is a 34 year old gentleman with  a right groin tunneled dialysis catheter. Unfortunately, he started  bleeding from the catheter. There was an actual hole within the catheter  piece so he is in need of a new dialysis catheter. The patient was given  the risks and benefits of the procedure including but not limited to  bleeding, infection, damage to adjacent structures, MI, stroke, and death,  as well as loss of dialysis access. The patient was understanding of all  the risks and underwent the procedure.    DESCRIPTION OF PROCEDURE: The patient was correctly identified in the  precatheterization area and taken to cath lab in stable condition. The  patient had a pre-incision time-out prior to any incision. The patient was  prepped and draped in a normal sterile fashion according to CDC guidelines  aseptic technique. We then were able take an Amplatz Super Stiff wire and  go through the blue port of the previous catheter. Gave 10 mL of 1%  lidocaine around the catheter itself, removed the catheter over a wire, and   then placed the new 33 cm Palindrome catheter with its stiffeners over the  wire, removed the wire, removed the stiffeners. The cuff was approximately  1-2 cm within the patient. The tip of the catheter was at the sinoatrial  junction. We then were able to flush and aspirate both ports, placed 2.3 mL  of hot heparin into each port, capped them and wrapped them with a 4 x 4,  secured the catheter with 2-0 Prolene suture at both butterfly holes as  well as the catheter insertion site. Biopatch and Tegaderm were then  placed. The patient tolerated the procedure well without any issues.                     Margaretmary Dys, MD    :wmx  D: 10/20/2015 T: 10/20/2015 09:50 P  Job: [id number]  CScriptDoc #: [id number]  cc:   Margaretmary Dys, MD  Wanda PlumpIRGHAYU DESAI, MD

## 2015-10-20 NOTE — Other (Signed)
[facility] no distress noted call bell within reach patient in bed resting with guard at bedside

## 2015-10-20 NOTE — Other (Signed)
Patient Demographics  ??   ?? Patient Name HAR Sex DOB Address Phone ??   ?? Tyler Ballard, Tyler Ballard [phone] Male 08/10/1981 [address]  [place] [id number] [phone] [facility](Home)  [phone] (Mobile) ??   ??   ?? CSN: ??   ?? [id number] ??   ??   ?? Admit Date: Admit Time Room Bed ??   ?? Oct 16, 2015 ??7:38 PM 360 [[id number]][[id number]] 01 [[id number]] ??   ??   ?? Attending Providers  ??   ?? Provider Pager From To ??   ?? Thomes DinningVictor T Anglin, MD  10/16/15 10/17/15 ??   ?? Wanda Plumpirghayu Desai, MD  10/17/15          Additional Notes ??   ?? Concurrent Review 10/20/15 ??   ??  ??   ?? OPERATIVE REPORT ??   ??  ??   ?? PREOPERATIVE DIAGNOSIS: End-stage renal disease with bleeding dialysis  ??   ?? catheter.  ??   ??  ??   ?? POSTOPERATIVE DIAGNOSIS: End-stage renal disease with bleeding dialysis  ??   ?? catheter.  ??   ??  ??   ?? CULTURES: None.  ??   ?? SPECIMENS REMOVED: None.  ??   ?? DRAINS: None.  ??   ?? ESTIMATED BLOOD LOSS: Less than 50 mL.  ??   ?? ANESTHESIA:  ??   ??  ??   ?? PROCEDURES PERFORMED: Removal of previous right common femoral vein  ??   ?? tunneled dialysis catheter and placement of a new 33 cm Palindrome tunneled  ??   ?? dialysis catheter over wire.  ??   ??  ??   ?? INDICATIONS FOR THE PROCEDURE: The patient is a 34 year old gentleman with  ??   ?? a right groin tunneled dialysis catheter. Unfortunately, he started  ??   ?? bleeding from the catheter. There was an actual hole within the catheter  ??   ?? piece so he is in need of a new dialysis catheter. The patient was given  ??   ?? the risks and benefits of the procedure including but not limited to  ??   ?? bleeding, infection, damage to adjacent structures, MI, stroke, and death,  ??   ?? as well as loss of dialysis access. The patient was understanding of all  ??   ?? the risks and underwent the procedure

## 2015-10-20 NOTE — Progress Notes (Signed)
Progress Note    Tyler Ballard  34 y.o.      Admit Date: 10/16/2015  Active Problems:    Sickle cell anemia with pain (HCC) (07/05/2012) POA: Yes      ESRD (end stage renal disease) on dialysis (HCC) (07/06/2012) POA: Yes      HTN (hypertension) (07/06/2012) POA: Yes      Renal failure (10/16/2015) POA: Unknown      HIV (human immunodeficiency virus infection) (HCC) (10/17/2015) POA: Unknown      Secondary hyperparathyroidism of renal origin (HCC) (10/18/2015) POA: Yes            Subjective:     Patient feels ok, had bleeding from right groin catheter, now stopped ,catheter is clampped & will go to OR,has low platelets & was receiving Heparin Sq. Discontinued today. Now admit that  He does not have any Pancreatic cancer, Right IJ catheter he claims for Blood transfusion, would not allow to use for dialysis, wants his (L) forearm AVG to be declotted & take out all the catheter out. His Hb electrophoresis  Does not support Sickle Cell disease. HIV result is pending.      A comprehensive review of systems was negative except for that written in the History of Present Illness.    Objective:     Visit Vitals   ??? BP (!) 206/92   ??? Pulse 90   ??? Temp 97.8 ??F (36.6 ??C)   ??? Resp 19   ??? Ht 5\' 7"  (1.702 m)   ??? Wt 97.5 kg (215 lb)   ??? SpO2 97%   ??? BMI 33.67 kg/m2         Intake/Output Summary (Last 24 hours) at 10/20/15 1202  Last data filed at 10/20/15 1610   Gross per 24 hour   Intake    540 ml   Output   2500 ml   Net  -1960 ml       Current Facility-Administered Medications   Medication Dose Route Frequency Provider Last Rate Last Dose   ??? hydrALAZINE (APRESOLINE) 20 mg/mL injection 10 mg  10 mg IntraVENous Q6H PRN Woodward Ku, MD       ??? diphenhydrAMINE (BENADRYL) capsule 25 mg  25 mg Oral Q6H PRN Betsy Pries, MD   25 mg at 10/20/15 1117   ??? ammonium lactate (LAC-HYDRIN) 12 % lotion   Topical BID Betsy Pries, MD   Stopped at 10/19/15 1800    ??? sodium chloride (NS) flush 5-10 mL  5-10 mL IntraVENous PRN Alben Deeds, CRNA       ??? naloxone (NARCAN) injection 0.1 mg  0.1 mg IntraVENous PRN Alben Deeds, CRNA       ??? glucose chewable tablet 16 g  4 Tab Oral PRN Alben Deeds, CRNA       ??? glucagon (GLUCAGEN) injection 1 mg  1 mg IntraMUSCular PRN Alben Deeds, CRNA       ??? dextrose (D50W) injection syrg 12.5-25 g  25-50 mL IntraVENous PRN Alben Deeds, CRNA       ??? sodium citrate 4 gram /100 mL (4 %) 0.08 g  2 mL Does Not Apply DIALYSIS PRN Loreli Dollar, MD       ??? acetaminophen (TYLENOL) tablet 500 mg  500 mg Oral Q6H PRN Betsy Pries, MD       ??? oxyCODONE-acetaminophen (PERCOCET 7.5) 7.5-325 mg per tablet 1 Tab  1 Tab Oral Q6H PRN Edilia Bo,  MD   1 Tab at 10/20/15 1117   ??? insulin lispro (HUMALOG) injection   SubCUTAneous AC&HS Wanda Plumpirghayu Desai, MD   Stopped at 10/18/15 2251   ??? calcium acetate (PHOSLO) capsule 667 mg  1 Cap Oral TID WITH MEALS Wanda Plumpirghayu Desai, MD   667 mg at 10/20/15 1117   ??? cloNIDine HCl (CATAPRES) tablet 0.1 mg  0.1 mg Oral BID Wanda Plumpirghayu Desai, MD   0.1 mg at 10/20/15 0857   ??? famotidine (PEPCID) tablet 20 mg  20 mg Oral DAILY Wanda Plumpirghayu Desai, MD   20 mg at 10/20/15 0857   ??? folic acid (FOLVITE) tablet 1 mg  1 mg Oral DAILY Wanda Plumpirghayu Desai, MD   1 mg at 10/20/15 0857   ??? hydroxyurea (HYDREA) chemo cap 500 mg  500 mg Oral DAILY Wanda Plumpirghayu Desai, MD   500 mg at 10/20/15 0857   ??? lisinopril (PRINIVIL, ZESTRIL) tablet 10 mg  10 mg Oral DAILY Wanda Plumpirghayu Desai, MD   10 mg at 10/20/15 0857   ??? sodium citrate 4 gram /100 mL (4 %) 0.08 g  2 mL Hemodialysis DIALYSIS TUE, THU & SAT Loreli DollarMosta Rachel Rison, MD   Stopped at 10/17/15 1000   ??? ondansetron (ZOFRAN) injection 4 mg  4 mg IntraVENous Q6H PRN Ouida SillsJason L Andre, MD   4 mg at 10/20/15 1140        Physical Exam:     Physical Exam:   General:  Alert, cooperative, no distress, appears stated age.   Mouth/Throat: Lips, mucosa, and tongue normal. Teeth and gums normal.    Neck: Supple, symmetrical, trachea midline, no adenopathy, thyroid: no enlargement/tenderness/nodules, no carotid bruit and no JVD.   Lungs:   Clear to auscultation bilaterally.   Heart:  Regular rate and rhythm, S1, S2 normal, no murmur, click, rub or gallop.   Abdomen:   Soft, non-tender. Bowel sounds normal. No masses,  No organomegaly.   Extremities: Extremities normal, atraumatic, no cyanosis or edema, has clotted Graft on left arm, (L) IJ catheter, (R) groin catheter.   Skin: Skin color, texture, turgor normal. No rashes or lesions         Data Review:    CBC w/Diff    Recent Labs      10/20/15   1105  10/19/15   1730  10/17/15   1505   WBC  2.2*  1.8*  2.4*   RBC  2.61*  2.71*  2.88*   HGB  7.7*  8.1*  8.7*   HCT  25.6*  26.6*  27.5*   MCV  98.1*  98.2*  95.5   MCH  29.5  29.9  30.2   MCHC  30.1*  30.5*  31.6   RDW  17.8*  17.9*  18.2*    Recent Labs      10/20/15   1105  10/19/15   1730  10/17/15   1505   MONOS  PENDING  12*  11*   EOS  PENDING  6*  9*   BASOS  PENDING  0  0   RDW  17.8*  17.9*  18.2*        Comprehensive Metabolic Profile    Recent Labs      10/19/15   1730  10/17/15   1505   NA  138  137   K  5.1  5.5   CL  103  102   CO2  25  24   BUN  50*  75*   CREA  7.26*  10.60*    Recent Labs      10/19/15   1730  10/17/15   1505   CA  8.0*   7.7*  7.4*   PHOS  4.7   --    ALB   --   2.6*   TP   --   8.7*   SGOT   --   29   TBILI   --   0.5                        Impression:       Active Hospital Problems    Diagnosis Date Noted   ??? Secondary hyperparathyroidism of renal origin (HCC) 10/18/2015   ??? HIV (human immunodeficiency virus infection) (HCC) 10/17/2015   ??? Renal failure 10/16/2015   ??? ESRD (end stage renal disease) on dialysis (HCC) 07/06/2012   ??? HTN (hypertension) 07/06/2012   ??? Sickle cell anemia with pain (HCC) 07/05/2012    ? Pancytopenia.        Plan:     DC Heparin even SQ, May have Myelodysplastic syndrome, check HIT  Panel,strongly recommend Hematology consult, vascular surgery to take him to OR for catheter  & AVG issue, discussed with Dr. Ronne Binning, dialysis tomorrow.      Loreli Dollar, MD

## 2015-10-20 NOTE — Progress Notes (Signed)
Hospitalist Progress Note    Patient: Tyler Ballard MRN: [id number]  CSN: [id number]    Date of Birth: 1981/07/08  Age: 34 y.o.  Sex: male    DOA: 10/16/2015 LOS:  LOS: 4 days          Pt feels ok, had some HA better.  Came to see pt after RRT but was in OR.  Pt just back from OR after HD cath placement   Pt declines any hx cancer, no pancreatic cancer, he meant pancreatitis to nephrologist       Assessment/Plan     1. Non-functioning HD access - s/p HD catheter placement by vascular. Had bleeding from cath this am so was taken to OR and new cath placed.   2. ESRD / HD per nephrology.   3. Sickle cell anemia: stable now.   4. HTN: BP high in 200's: will cont ACE and increase catapres. Add norvasc  5. HIV/AIDS: asymptomatic, no infections: ID in put noted. Agree with prophylax tic Abx  6. Obesity Body mass index is 33.67 kg/(m^2).  7. pancytopenia in the setting of HIV, no need for oncology eval   8. Anemia of ckd per renal  [facility]c [facility] once cleared by vas and renal team        Case discussed with:  Patient  Family  Nursing  Case Management  DVT Prophylaxis:  Lovenox  Hep SQ  SCDs  Coumadin   On Heparin gtt    Vital signs/Intake and Output:  Visit Vitals   ??? BP 144/76 (BP 1 Location: Left arm, BP Patient Position: At rest)   ??? Pulse 94   ??? Temp 97.8 ??F (36.6 ??C)   ??? Resp 18   ??? Ht  (1.702 m)   ??? Wt 97.5 kg (215 lb)   ??? SpO2 99%   ??? BMI 33.67 kg/m2       Awake alert and oriented.  Ncat. Perrl. Poor dentition  R groin IJ TDC. No signs of bleeding   RRR  cta b.l  Soft nt nd nabs.  AVG.   No focal deficit  No rash    Medications Reviewed      Labs: Results:       Chemistry Recent Labs      10/20/15   1105  10/19/15   1730   GLU  116*  85   NA  138  138   K  4.8  5.1   CL  104  103   CO2  25  25   BUN  35*  50*   CREA  5.54*  7.26*   CA  8.3*  8.0*   7.7*   AGAP  9  10   BUCR  6*  7*      CBC w/Diff Recent Labs      10/20/15   1105  10/19/15   1730   WBC  2.2*  1.8*   RBC  2.61*  2.71*   HGB  7.7*  8.1*    HCT  25.6*  26.6*   PLT  77*  85*   GRANS  44  52   LYMPH  32  30   EOS  4  6*      Cardiac Enzymes No results for input(s): CPK, CKND1, MYO in the last 72 hours.    No lab exists for component: CKRMB, TROIP   Coagulation Recent Labs      10/20/15   1105  10/19/15  1730   PTP  15.4*  15.6*   INR  1.3*  1.3*   APTT  40.2*   --        Lipid Panel Lab Results   Component Value Date/Time    CHOLESTEROL, TOTAL 85 11/29/2012 09:36 AM    HDL CHOLESTEROL 33 11/29/2012 09:36 AM    LDL, CALCULATED 41.2 11/29/2012 09:36 AM    VLDL, CALCULATED 10.8 11/29/2012 09:36 AM    TRIGLYCERIDE 54 11/29/2012 09:36 AM    CHOL/HDL RATIO 2.6 11/29/2012 09:36 AM      BNP No results for input(s): BNPP in the last 72 hours.   Liver Enzymes No results for input(s): TP, ALB, TBIL, AP, SGOT, GPT in the last 72 hours.    No lab exists for component: DBIL   Thyroid Studies No results found for: T4, T3U, TSH, TSHEXT, TSHEXT     Procedures/imaging: see electronic medical records for all procedures/Xrays and details which were not copied into this note but were reviewed prior to creation of Plan.

## 2015-10-21 ENCOUNTER — Inpatient Hospital Stay: Admit: 2015-10-21 | Payer: PRIVATE HEALTH INSURANCE | Primary: Family Medicine

## 2015-10-21 LAB — HIV-1 RNA QT BY PCR
HIV-1 RNA by PCR: 51600 copies/mL
HIV-1 RNA by PCR: 51600 copies/mL
HIV-1 RNA log10 Copies/mL: 4.713 log10copy/mL
LOG10 HIV-1 RNA,550424: 4.713 log10copy/mL

## 2015-10-21 LAB — CBC WITH AUTOMATED DIFF
ABS. BASOPHILS: 0.1 10*3/uL — ABNORMAL HIGH (ref 0.0–0.06)
ABS. EOSINOPHILS: 0.2 10*3/uL (ref 0.0–0.4)
ABS. LYMPHOCYTES: 0.7 10*3/uL — ABNORMAL LOW (ref 0.9–3.6)
ABS. MONOCYTES: 0.2 10*3/uL (ref 0.05–1.2)
ABS. NEUTROPHILS: 1.3 10*3/uL — ABNORMAL LOW (ref 1.8–8.0)
BASOPHILS: 2 % (ref 0–2)
EOSINOPHILS: 7 % — ABNORMAL HIGH (ref 0–5)
HCT: 19.7 % — ABNORMAL LOW (ref 36.0–48.0)
HGB: 6.2 g/dL — ABNORMAL LOW (ref 13.0–16.0)
LYMPHOCYTES: 27 % (ref 21–52)
MCH: 30.5 PG (ref 24.0–34.0)
MCHC: 31.5 g/dL (ref 31.0–37.0)
MCV: 97 FL (ref 74.0–97.0)
MONOCYTES: 10 % (ref 3–10)
MPV: 10.2 FL (ref 9.2–11.8)
NEUTROPHILS: 54 % (ref 40–73)
PLATELET: 93 10*3/uL — ABNORMAL LOW (ref 135–420)
RBC: 2.03 M/uL — ABNORMAL LOW (ref 4.70–5.50)
RDW: 17.4 % — ABNORMAL HIGH (ref 11.6–14.5)
WBC: 2.5 10*3/uL — ABNORMAL LOW (ref 4.6–13.2)

## 2015-10-21 LAB — METABOLIC PANEL, BASIC
Anion gap: 8 mmol/L (ref 3.0–18)
BUN/Creatinine ratio: 8 — ABNORMAL LOW (ref 12–20)
BUN: 48 MG/DL — ABNORMAL HIGH (ref 7.0–18)
CO2: 25 mmol/L (ref 21–32)
Calcium: 7.8 MG/DL — ABNORMAL LOW (ref 8.5–10.1)
Chloride: 104 mmol/L (ref 100–108)
Creatinine: 6.37 MG/DL — ABNORMAL HIGH (ref 0.6–1.3)
GFR est AA: 12 mL/min/{1.73_m2} — ABNORMAL LOW (ref >60)
GFR est non-AA: 10 mL/min/{1.73_m2} — ABNORMAL LOW (ref >60)
Glucose: 82 mg/dL (ref 74–99)
Potassium: 5.4 mmol/L (ref 3.5–5.5)
Sodium: 137 mmol/L (ref 136–145)

## 2015-10-21 LAB — PROTHROMBIN TIME + INR
INR: 1.3 — ABNORMAL HIGH (ref 0.8–1.2)
Prothrombin time: 15.9 s — ABNORMAL HIGH (ref 11.5–15.2)

## 2015-10-21 LAB — RETICULOCYTE COUNT: Reticulocyte count: 2.4 % — ABNORMAL HIGH (ref 0.5–2.3)

## 2015-10-21 LAB — CANCER AG 19-9: Carbohydrate Antigen 19-9, (CA 19-9): 3 U/mL (ref 0–35)

## 2015-10-21 LAB — PHOSPHORUS: Phosphorus: 4.2 MG/DL (ref 2.5–4.9)

## 2015-10-21 MED ORDER — D5-1/4 NS & POTASSIUM CHLORIDE 20 MEQ/L IV
20 mEq/L | INTRAVENOUS | Status: DC
Start: 2015-10-21 — End: 2015-10-21

## 2015-10-21 MED ORDER — LIDOCAINE (PF) 20 MG/ML (2 %) IJ SOLN
20 mg/mL (2 %) | INTRAMUSCULAR | Status: DC | PRN
Start: 2015-10-21 — End: 2015-10-21
  Administered 2015-10-21 (×3): via INTRAVENOUS

## 2015-10-21 MED ORDER — ONDANSETRON (PF) 4 MG/2 ML INJECTION
4 mg/2 mL | Freq: Once | INTRAMUSCULAR | Status: DC | PRN
Start: 2015-10-21 — End: 2015-10-23

## 2015-10-21 MED ORDER — CEFAZOLIN 2 GM/50 ML IN DEXTROSE (ISO-OSMOTIC) IVPB
2 gram/50 mL | Freq: Once | INTRAVENOUS | Status: DC
Start: 2015-10-21 — End: 2015-10-21

## 2015-10-21 MED ORDER — DEXTROSE 5%-1/4 NORMAL SALINE IV
INTRAVENOUS | Status: DC | PRN
Start: 2015-10-21 — End: 2015-10-21
  Administered 2015-10-21: 21:00:00 via INTRAVENOUS

## 2015-10-21 MED ORDER — IODIXANOL 320 MG/ML IV SOLN
320 mg iodine/mL | INTRAVENOUS | Status: AC
Start: 2015-10-21 — End: 2015-10-22

## 2015-10-21 MED ORDER — HEPARIN (PORCINE) IN NS (PF) 1,000 UNIT/500 ML IV
1000 unit/500 mL | INTRAVENOUS | Status: AC
Start: 2015-10-21 — End: 2015-10-22

## 2015-10-21 MED ORDER — HYDROCODONE-ACETAMINOPHEN 5 MG-325 MG TAB
5-325 mg | ORAL | Status: DC | PRN
Start: 2015-10-21 — End: 2015-10-22

## 2015-10-21 MED ORDER — IODIXANOL 320 MG/ML IV SOLN
320 mg iodine/mL | INTRAVENOUS | Status: DC | PRN
Start: 2015-10-21 — End: 2015-10-21
  Administered 2015-10-21: 22:00:00

## 2015-10-21 MED ORDER — MORPHINE 2 MG/ML INJECTION
2 mg/mL | INTRAMUSCULAR | Status: DC | PRN
Start: 2015-10-21 — End: 2015-10-22
  Administered 2015-10-21 – 2015-10-22 (×6): via INTRAVENOUS

## 2015-10-21 MED ORDER — SODIUM CHLORIDE 0.9 % IV
INTRAVENOUS | Status: DC | PRN
Start: 2015-10-21 — End: 2015-10-21
  Administered 2015-10-21: 21:00:00 via INTRAVENOUS

## 2015-10-21 MED ORDER — SODIUM CHLORIDE 0.9 % IV
INTRAVENOUS | Status: DC | PRN
Start: 2015-10-21 — End: 2015-10-29

## 2015-10-21 MED ORDER — HYDROMORPHONE (PF) 1 MG/ML IJ SOLN
1 mg/mL | Freq: Once | INTRAMUSCULAR | Status: AC
Start: 2015-10-21 — End: 2015-10-21

## 2015-10-21 MED ORDER — DIPHENHYDRAMINE HCL 50 MG/ML IJ SOLN
50 mg/mL | Freq: Once | INTRAMUSCULAR | Status: AC | PRN
Start: 2015-10-21 — End: 2015-10-21
  Administered 2015-10-21: 23:00:00 via INTRAVENOUS

## 2015-10-21 MED ORDER — LIDOCAINE (PF) 10 MG/ML (1 %) IJ SOLN
10 mg/mL (1 %) | INTRAMUSCULAR | Status: AC
Start: 2015-10-21 — End: 2015-10-22

## 2015-10-21 MED ORDER — MIDAZOLAM 1 MG/ML IJ SOLN
1 mg/mL | INTRAMUSCULAR | Status: DC | PRN
Start: 2015-10-21 — End: 2015-10-21
  Administered 2015-10-21: 22:00:00 via INTRAVENOUS

## 2015-10-21 MED ORDER — FENTANYL CITRATE (PF) 50 MCG/ML IJ SOLN
50 mcg/mL | INTRAMUSCULAR | Status: DC | PRN
Start: 2015-10-21 — End: 2015-10-22
  Administered 2015-10-21 – 2015-10-22 (×3): via INTRAVENOUS

## 2015-10-21 MED ORDER — HEPARIN (PORCINE) 5,000 UNIT/ML IJ SOLN
5000 unit/mL | INTRAMUSCULAR | Status: AC
Start: 2015-10-21 — End: 2015-10-22

## 2015-10-21 MED ORDER — HEPARIN (PORCINE) IN NS (PF) 1,000 UNIT/500 ML IV
1000 unit/500 mL | INTRAVENOUS | Status: DC | PRN
Start: 2015-10-21 — End: 2015-10-21
  Administered 2015-10-21: 22:00:00

## 2015-10-21 MED ORDER — HEPARIN (PORCINE) 5,000 UNIT/ML IJ SOLN
5000 unit/mL | INTRAMUSCULAR | Status: DC | PRN
Start: 2015-10-21 — End: 2015-10-21
  Administered 2015-10-21: 22:00:00

## 2015-10-21 MED ORDER — TRIMETHOPRIM-SULFAMETHOXAZOLE 80 MG-400 MG TAB
80-400 mg | ORAL | Status: DC
Start: 2015-10-21 — End: 2015-10-23

## 2015-10-21 MED ORDER — CEFAZOLIN 2 G IN 100 ML 0.9% NS
2 gram/100 mL | INTRAVENOUS | Status: DC | PRN
Start: 2015-10-21 — End: 2015-10-21
  Administered 2015-10-21: 21:00:00 via INTRAVENOUS

## 2015-10-21 MED ORDER — LIDOCAINE (PF) 10 MG/ML (1 %) IJ SOLN
10 mg/mL (1 %) | INTRAMUSCULAR | Status: DC | PRN
Start: 2015-10-21 — End: 2015-10-21
  Administered 2015-10-21 (×2): via SUBCUTANEOUS

## 2015-10-21 MED ORDER — LACTATED RINGERS IV
INTRAVENOUS | Status: DC
Start: 2015-10-21 — End: 2015-10-22

## 2015-10-21 MED ORDER — DEXTROSE 5%-1/4 NORMAL SALINE IV
INTRAVENOUS | Status: DC
Start: 2015-10-21 — End: 2015-10-22
  Administered 2015-10-21: 22:00:00 via INTRAVENOUS

## 2015-10-21 MED ORDER — PROPOFOL 10 MG/ML IV EMUL
10 mg/mL | INTRAVENOUS | Status: DC | PRN
Start: 2015-10-21 — End: 2015-10-21
  Administered 2015-10-21 (×3): via INTRAVENOUS

## 2015-10-21 MED ORDER — HYDROMORPHONE (PF) 1 MG/ML IJ SOLN
1 mg/mL | INTRAMUSCULAR | Status: AC
Start: 2015-10-21 — End: 2015-10-21
  Administered 2015-10-21: 21:00:00

## 2015-10-21 MED ORDER — PROMETHAZINE 25 MG/ML INJECTION
25 mg/mL | Freq: Four times a day (QID) | INTRAMUSCULAR | Status: DC | PRN
Start: 2015-10-21 — End: 2015-10-24
  Administered 2015-10-21 – 2015-10-24 (×10): via INTRAVENOUS

## 2015-10-21 MED ORDER — MIDAZOLAM 1 MG/ML IJ SOLN
1 mg/mL | INTRAMUSCULAR | Status: AC
Start: 2015-10-21 — End: ?

## 2015-10-21 MED ORDER — SODIUM CHLORIDE 0.9 % INJECTION
202 mg/2 mL | Freq: Once | INTRAMUSCULAR | Status: AC
Start: 2015-10-21 — End: 2015-10-21
  Administered 2015-10-21: 21:00:00 via INTRAVENOUS

## 2015-10-21 MED ORDER — HYDROMORPHONE (PF) 2 MG/ML IJ SOLN
2 mg/mL | INTRAMUSCULAR | Status: AC | PRN
Start: 2015-10-21 — End: 2015-10-21
  Administered 2015-10-21 (×4): via INTRAVENOUS

## 2015-10-21 MED FILL — MORPHINE 2 MG/ML INJECTION: 2 mg/mL | INTRAMUSCULAR | Qty: 1

## 2015-10-21 MED FILL — DEXTROSE 5%-1/4 NORMAL SALINE IV: INTRAVENOUS | Qty: 1000

## 2015-10-21 MED FILL — PROMETHAZINE 25 MG/ML INJECTION: 25 mg/mL | INTRAMUSCULAR | Qty: 1

## 2015-10-21 MED FILL — CEFAZOLIN 2 GM/50 ML IN DEXTROSE (ISO-OSMOTIC) IVPB: 2 gram/50 mL | INTRAVENOUS | Qty: 50

## 2015-10-21 MED FILL — HEPARIN (PORCINE) IN NS (PF) 1,000 UNIT/500 ML IV: 1000 unit/500 mL | INTRAVENOUS | Qty: 500

## 2015-10-21 MED FILL — MIDAZOLAM 1 MG/ML IJ SOLN: 1 mg/mL | INTRAMUSCULAR | Qty: 2

## 2015-10-21 MED FILL — VISIPAQUE 320 MG IODINE/ML INTRAVENOUS SOLUTION: 320 mg iodine/mL | INTRAVENOUS | Qty: 50

## 2015-10-21 MED FILL — FAMOTIDINE (PF) 20 MG/2 ML IV: 20 mg/2 mL | INTRAVENOUS | Qty: 2

## 2015-10-21 MED FILL — HYDROMORPHONE (PF) 1 MG/ML IJ SOLN: 1 mg/mL | INTRAMUSCULAR | Qty: 1

## 2015-10-21 MED FILL — LIDOCAINE (PF) 10 MG/ML (1 %) IJ SOLN: 10 mg/mL (1 %) | INTRAMUSCULAR | Qty: 30

## 2015-10-21 MED FILL — LACTATED RINGERS IV: INTRAVENOUS | Qty: 1000

## 2015-10-21 MED FILL — CLONIDINE 0.1 MG TAB: 0.1 mg | ORAL | Qty: 2

## 2015-10-21 MED FILL — HYDROXYUREA 500 MG CAPSULE: 500 mg | ORAL | Qty: 1

## 2015-10-21 MED FILL — FENTANYL CITRATE (PF) 50 MCG/ML IJ SOLN: 50 mcg/mL | INTRAMUSCULAR | Qty: 2

## 2015-10-21 MED FILL — LISINOPRIL 10 MG TAB: 10 mg | ORAL | Qty: 1

## 2015-10-21 MED FILL — D5-1/4 NS & POTASSIUM CHLORIDE 20 MEQ/L IV: 20 mEq/L | INTRAVENOUS | Qty: 1000

## 2015-10-21 MED FILL — FAMOTIDINE 20 MG TAB: 20 mg | ORAL | Qty: 1

## 2015-10-21 MED FILL — FOLIC ACID 1 MG TAB: 1 mg | ORAL | Qty: 1

## 2015-10-21 MED FILL — DIPHENHYDRAMINE HCL 50 MG/ML IJ SOLN: 50 mg/mL | INTRAMUSCULAR | Qty: 1

## 2015-10-21 MED FILL — TRIMETHOPRIM-SULFAMETHOXAZOLE 80 MG-400 MG TAB: 80-400 mg | ORAL | Qty: 1

## 2015-10-21 MED FILL — DIPHENHYDRAMINE 25 MG CAP: 25 mg | ORAL | Qty: 1

## 2015-10-21 MED FILL — SODIUM CHLORIDE 0.9 % IV: INTRAVENOUS | Qty: 250

## 2015-10-21 MED FILL — ONDANSETRON (PF) 4 MG/2 ML INJECTION: 4 mg/2 mL | INTRAMUSCULAR | Qty: 2

## 2015-10-21 MED FILL — DILAUDID (PF) 2 MG/ML INJECTION SOLUTION: 2 mg/mL | INTRAMUSCULAR | Qty: 1

## 2015-10-21 MED FILL — HEPARIN (PORCINE) 5,000 UNIT/ML IJ SOLN: 5000 unit/mL | INTRAMUSCULAR | Qty: 2

## 2015-10-21 NOTE — Anesthesia Post-Procedure Evaluation (Signed)
Post-Anesthesia Evaluation and Assessment    Patient: Tyler Ballard MRN: [id number]  SSN: [id number]    Date of Birth: [birth year]  Age: 34 y.o.  Sex: male       Cardiovascular Function/Vital Signs  Visit Vitals   ??? BP 134/75   ??? Pulse 98   ??? Temp 36.8 ??C (98.2 ??F)   ??? Resp 19   ??? Ht 5\' 7"  (1.702 m)   ??? Wt 97.5 kg (215 lb)   ??? SpO2 96%   ??? BMI 33.67 kg/m2       Patient is status post MAC anesthesia for Procedure(s):  CENTRAL VENA GRAM  REMOVAL OF RIGHT PERITONEAL  TUNNEL DIALYSIS CATHETER   .    Nausea/Vomiting: None    Postoperative hydration reviewed and adequate.    Pain:  Pain Scale 1: Visual (10/21/15 1901)  Pain Intensity 1: 0 (10/21/15 1901)   Managed    Neurological Status:   Neuro (WDL): Within Defined Limits (10/21/15 1901)   At baseline    Mental Status and Level of Consciousness: Arousable    Pulmonary Status:   O2 Device: Nasal cannula (10/21/15 1916)   Adequate oxygenation and airway patent    Complications related to anesthesia: None    Post-anesthesia assessment completed. No concerns    Signed By: Alma Friendlyheresa H Cynethia Schindler, MD     October 21, 2015

## 2015-10-21 NOTE — Progress Notes (Signed)
Called to bedside early this morning for bleeding from new right groin catheter site  Had bleeding yesterday and found to be a crack in the catheter and subsequently replaced  Pt noted bleeding again this morning. Nursing evaluated and clamped catheter with hemostat and bleeding did stop  No active bleeding by time I arrived at bedside shortly after 730am  Hemostat in place and again, an actual crack in catheter itself  Dr Ronne Binningmckenzie then to bedside  Discussed will take to OR today and attempt thrombectomy of left arm graft. Has been occluded approx 2-2.5 weeks based on our discussion. If unable to open, will place new catheter at new site and then remove hickman from left chest and remove this right groin catheter as well

## 2015-10-21 NOTE — Progress Notes (Signed)
Progress Note    Tyler Ballard  34 y.o.      Admit Date: 10/16/2015  Active Problems:    Sickle cell anemia with pain (HCC) (07/05/2012) POA: Yes      ESRD (end stage renal disease) on dialysis (HCC) (07/06/2012) POA: Yes      HTN (hypertension) (07/06/2012) POA: Yes      Renal failure (10/16/2015) POA: Unknown      HIV (human immunodeficiency virus infection) (HCC) (10/17/2015) POA: Unknown      Secondary hyperparathyroidism of renal origin (HCC) (10/18/2015) POA: Yes            Subjective:     Patient is NPO now Now, awaiting to be taken to OR for dialysis access, all incident reviewed & discussed with the patient.      A comprehensive review of systems was negative except for that written in the History of Present Illness.    Objective:     Visit Vitals   ??? BP 146/72 (BP 1 Location: Right arm, BP Patient Position: At rest)   ??? Pulse 90   ??? Temp 98.3 ??F (36.8 ??C)   ??? Resp 18   ??? Ht 5\' 7"  (1.702 m)   ??? Wt 97.5 kg (215 lb)   ??? SpO2 99%   ??? BMI 33.67 kg/m2         Intake/Output Summary (Last 24 hours) at 10/21/15 1334  Last data filed at 10/21/15 1304   Gross per 24 hour   Intake    770 ml   Output      0 ml   Net    770 ml       Current Facility-Administered Medications   Medication Dose Route Frequency Provider Last Rate Last Dose   ??? HYDROmorphone (PF) (DILAUDID) 1 mg/mL injection            ??? trimethoprim-sulfamethoxazole (BACTRIM, SEPTRA) 80-400 mg per tablet 1 Tab  1 Tab Oral DIALYSIS MON, WED & FRI Gordy Levan, MD       ??? 0.9% sodium chloride infusion 250 mL  250 mL IntraVENous PRN Loreli Dollar, MD       ??? hydrALAZINE (APRESOLINE) 20 mg/mL injection 10 mg  10 mg IntraVENous Q6H PRN Woodward Ku, MD       ??? cloNIDine HCl (CATAPRES) tablet 0.2 mg  0.2 mg Oral BID Woodward Ku, MD   Stopped at 10/21/15 [phone]   ??? promethazine (PHENERGAN) 25 mg in 0.9% sodium chloride 50 mL IVPB  25 mg IntraVENous Q6H PRN Theodis Aguas, MD 200 mL/hr at 10/21/15 1150 25 mg at 10/21/15 1150    ??? morphine injection 2 mg  2 mg IntraVENous Q4H PRN Theodis Aguas, MD   2 mg at 10/21/15 1150   ??? diphenhydrAMINE (BENADRYL) capsule 25 mg  25 mg Oral Q6H PRN Betsy Pries, MD   25 mg at 10/21/15 1150   ??? ammonium lactate (LAC-HYDRIN) 12 % lotion   Topical BID Betsy Pries, MD       ??? sodium chloride (NS) flush 5-10 mL  5-10 mL IntraVENous PRN Alben Deeds, CRNA       ??? naloxone (NARCAN) injection 0.1 mg  0.1 mg IntraVENous PRN Alben Deeds, CRNA       ??? glucose chewable tablet 16 g  4 Tab Oral PRN Alben Deeds, CRNA       ??? glucagon (GLUCAGEN) injection 1 mg  1 mg IntraMUSCular  PRN Alben DeedsEbony S McNeal, CRNA       ??? dextrose (D50W) injection syrg 12.5-25 g  25-50 mL IntraVENous PRN Alben DeedsEbony S McNeal, CRNA       ??? sodium citrate 4 gram /100 mL (4 %) 0.08 g  2 mL Does Not Apply DIALYSIS PRN Loreli DollarMosta Rian Busche, MD       ??? acetaminophen (TYLENOL) tablet 500 mg  500 mg Oral Q6H PRN Betsy PriesEmily E Schley, MD       ??? oxyCODONE-acetaminophen (PERCOCET 7.5) 7.5-325 mg per tablet 1 Tab  1 Tab Oral Q6H PRN Edilia BoKabaye Berhanu, MD   1 Tab at 10/20/15 1117   ??? calcium acetate (PHOSLO) capsule 667 mg  1 Cap Oral TID WITH MEALS Wanda Plumpirghayu Desai, MD   667 mg at 10/20/15 1638   ??? famotidine (PEPCID) tablet 20 mg  20 mg Oral DAILY Wanda Plumpirghayu Desai, MD   20 mg at 10/21/15 [phone]   ??? folic acid (FOLVITE) tablet 1 mg  1 mg Oral DAILY Wanda Plumpirghayu Desai, MD   1 mg at 10/21/15 [phone]   ??? hydroxyurea (HYDREA) chemo cap 500 mg  500 mg Oral DAILY Wanda Plumpirghayu Desai, MD   500 mg at 10/21/15 [phone]   ??? lisinopril (PRINIVIL, ZESTRIL) tablet 10 mg  10 mg Oral DAILY Wanda Plumpirghayu Desai, MD   Stopped at 10/21/15 [phone]   ??? sodium citrate 4 gram /100 mL (4 %) 0.08 g  2 mL Hemodialysis DIALYSIS TUE, THU & SAT Loreli DollarMosta Sandrina Heaton, MD   Stopped at 10/17/15 1000   ??? ondansetron (ZOFRAN) injection 4 mg  4 mg IntraVENous Q6H PRN Ouida SillsJason L Andre, MD   4 mg at 10/21/15 [phone]        Physical Exam:     Physical Exam:   General:  Alert, cooperative, no distress, appears stated age.    Neck: Supple, symmetrical, trachea midline, no adenopathy, thyroid: no enlargement/tenderness/nodules, no carotid bruit and no JVD.   Lungs:   Clear to auscultation bilaterally.   Heart:  Regular rate and rhythm, S1, S2 normal, no murmur, click, rub or gallop.   Abdomen:   Soft, non-tender. Bowel sounds normal. No masses,  No organomegaly.   Extremities: Extremities normal, atraumatic, no cyanosis or edema, Has Clotted AVG on (L) forearm, Catheter  on Left IJ, Clammed new HD catheter in (R) groin   Skin: Skin color, texture, turgor normal. No rashes or lesions         Data Review:    CBC w/Diff    Recent Labs      10/21/15   0735  10/20/15   1105  10/19/15   1730   WBC  2.5*  2.2*  1.8*   RBC  2.03*  2.61*  2.71*   HGB  6.2*  7.7*  8.1*   HCT  19.7*  25.6*  26.6*   MCV  97.0  98.1*  98.2*   MCH  30.5  29.5  29.9   MCHC  31.5  30.1*  30.5*   RDW  17.4*  17.8*  17.9*    Recent Labs      10/21/15   0735  10/20/15   1105  10/19/15   1730   BANDS   --   6*   --    MONOS  10  14*  12*   EOS  7*  4  6*   BASOS  2  0  0   RDW  17.4*  17.8*  17.9*  Comprehensive Metabolic Profile    Recent Labs      10/21/15   0735  10/20/15   1105  10/19/15   1730   NA  137  138  138   K  5.4  4.8  5.1   CL  104  104  103   CO2  BUN  48*  35*  50*   CREA  6.37*  5.54*  7.26*    Recent Labs      10/21/15   0735  10/20/15   1105  10/19/15   1730   CA  7.8*  8.3*  8.0*   7.7*   PHOS  4.2   --   4.7                        Impression:       Active Hospital Problems    Diagnosis Date Noted   ??? Secondary hyperparathyroidism of renal origin (HCC) 10/18/2015   ??? HIV (human immunodeficiency virus infection) (HCC) 10/17/2015   ??? Renal failure 10/16/2015   ??? ESRD (end stage renal disease) on dialysis (HCC) 07/06/2012   ??? HTN (hypertension) 07/06/2012   ??? Sickle cell anemia with pain (HCC) 07/05/2012            Plan:     [facility] can de clott that AVG, once have HD access then will  Dialyze  & give 2 units of PRBC during dialysis.      Loreli Dollar, MD

## 2015-10-21 NOTE — Other (Addendum)
Received patient from PACU per hospital bed, awake, alert in no distress. Spoke with dialysis RN Kandace BlitzJames Tate that per report from PACU RN Neysa Bonitohristy, patient has no dialysis access at this time due to unsuccessful de-clotting of perma cath on chest wall and AV fistula to left forearm. Plan for herograft tomorrow.   0600-NPO after midnight maintained. Obtained orders for dilaudid since pt states no relief from morphine. Was also medicated with phenergan since no relief from zofran. Pt rested well after pain meds and phenergan   .were given.  0730-Bedside shift change report given to Sherril CongZenobia Brewington (oncoming nurse) by Andria MeuseA Yida Hyams RN (offgoing nurse).  Report given with SBAR, Kardex, Intake/Output, MAR and Recent Results.

## 2015-10-21 NOTE — Progress Notes (Signed)
Infectious Disease Consultation Note    Requested by: Dr. Loretha Stapler    Reason: HIV/AIDS     Current abx Prior abx         Lines:       Assessment :    34 y.o. 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     Now admitted with difficulty with dialysis access, missed hemodialysis s/p HD catheter exchange over guidewire on 11/1.     Patient has longstanding HIV/AIDS. Absolute cd4 count on 10/29 is 125. HIV viral load is pending    Currently no evidence of opportunistic infections noted    Recommendations:    1. cont prophylactic trimeth/sulfa,d/c azithromycin  2. F/u HIV viral load  3. [facility] - [facility] needs to obtain HIV testing records from [place] and [facility]. [facility] will make appointment to see patient after reviewing that. Discussed with patient.        Above plan was discussed in details with patient, case manager.  Please call me if any further questions or concerns. Will continue to participate in the care of this patient.    subjective:    Feels fine. He denies significant weight loss, drenching night sweats, cough, hemoptysis, visual disturbance, sorethroat, odynophagia. Patient denies headaches, visual disturbances, sore throat, runny nose, earaches, cp, sob, chills, cough, abdominal pain, diarrhea, burning micturition, pain or weakness in extremities. He denies back pain/flank pain.  He denies recent sick contacts. No h/o recent travel. No known h/o MRSA colonization or infection in the past.      Home Medication List       Details   hydroxyurea (HYDREA) 500 mg capsule Take 500 mg by mouth daily.      folic acid (FOLVITE) 1 mg tablet Take 1 mg by mouth daily.      cloNIDine HCl (CATAPRES) 0.1 mg tablet Take 1 tablet by mouth two (2) times a day.   Qty: 60 tablet, Refills: 0      lisinopril (PRINIVIL, ZESTRIL) 10 mg tablet Take 1 tablet by mouth daily.  Qty: 30 tablet, Refills: 0      famotidine (PEPCID) 20 mg tablet Take 1 tablet by mouth daily.  Qty: 30 tablet, Refills: 0         epoetin alfa (EPOGEN) 10,000 unit/mL injection by SubCUTAneous route once.      calcium acetate (PHOSLO) 667 mg cap Take  by mouth three (3) times daily (with meals).             Current Facility-Administered Medications   Medication Dose Route Frequency   ??? hydrALAZINE (APRESOLINE) 20 mg/mL injection 10 mg  10 mg IntraVENous Q6H PRN   ??? trimethoprim-sulfamethoxazole (BACTRIM, SEPTRA) 80-400 mg per tablet 1 Tab  1 Tab Oral DIALYSIS TUE, THU & SAT   ??? cloNIDine HCl (CATAPRES) tablet 0.2 mg  0.2 mg Oral BID   ??? promethazine (PHENERGAN) 25 mg in 0.9% sodium chloride 50 mL IVPB  25 mg IntraVENous Q6H PRN   ??? morphine injection 2 mg  2 mg IntraVENous Q4H PRN   ??? diphenhydrAMINE (BENADRYL) capsule 25 mg  25 mg Oral Q6H PRN   ??? ammonium lactate (LAC-HYDRIN) 12 % lotion   Topical BID   ??? sodium chloride (NS) flush 5-10 mL  5-10 mL IntraVENous PRN   ??? naloxone (NARCAN) injection 0.1 mg  0.1 mg IntraVENous PRN   ??? glucose chewable tablet 16 g  4 Tab Oral PRN   ??? glucagon (GLUCAGEN) injection 1 mg  1 mg IntraMUSCular PRN   ??? dextrose (D50W) injection syrg 12.5-25 g  25-50 mL IntraVENous PRN   ??? sodium citrate 4 gram /100 mL (4 %) 0.08 g  2 mL Does Not Apply DIALYSIS PRN   ??? acetaminophen (TYLENOL) tablet 500 mg  500 mg Oral Q6H PRN   ??? oxyCODONE-acetaminophen (PERCOCET 7.5) 7.5-325 mg per tablet 1 Tab  1 Tab Oral Q6H PRN   ??? calcium acetate (PHOSLO) capsule 667 mg  1 Cap Oral TID WITH MEALS   ??? famotidine (PEPCID) tablet 20 mg  20 mg Oral DAILY   ??? folic acid (FOLVITE) tablet 1 mg  1 mg Oral DAILY   ??? hydroxyurea (HYDREA) chemo cap 500 mg  500 mg Oral DAILY   ??? lisinopril (PRINIVIL, ZESTRIL) tablet 10 mg  10 mg Oral DAILY    ??? sodium citrate 4 gram /100 mL (4 %) 0.08 g  2 mL Hemodialysis DIALYSIS TUE, THU & SAT   ??? ondansetron (ZOFRAN) injection 4 mg  4 mg IntraVENous Q6H PRN       Allergies: Review of patient's allergies indicates no known allergies.    Temp (24hrs), Avg:98.1 ??F (36.7 ??C), Min:97.8 ??F (36.6 ??C), Max:98.4 ??F (36.9 ??C)    Visit Vitals   ??? BP 157/82 (BP 1 Location: Left arm, BP Patient Position: At rest)   ??? Pulse 94   ??? Temp 98 ??F (36.7 ??C)   ??? Resp 20   ??? Ht 5\' 7"  (1.702 m)   ??? Wt 97.5 kg (215 lb)   ??? SpO2 98%   ??? BMI 33.67 kg/m2       ROS: 12 point ROS obtained in details. Pertinent positives as mentioned in HPI,   otherwise negative    Physical Exam:    General: Well developed, well nourished male laying on the bed AAOx3 in no acute distress.    General:   awake alert and oriented   HEENT:  Normocephalic, atraumatic, PERRL, EOMI, no scleral icterus or pallor; no conjunctival hemmohage;  nasal and oral mucous are moist and without evidence of lesions. No thrush. Neck supple, no bruits.   Lymph Nodes:   no cervical, axillary or inguinal adenopathy   Lungs:   non-labored, bilaterally clear to aspiration- no crackles wheezes rales or rhonchi   Heart:  RRR, s1 and s2; no murmurs rubs or gallops, no edema, + pedal pulses   Abdomen:  soft, non-distended, active bowel sounds, no hepatomegaly, no splenomegaly. Non-tender   Genitourinary:  deferred   Extremities:   no clubbing, cyanosis; no joint effusions or swelling; Full ROM of all large joints to the upper and lower extremities; muscle mass appropriate for age   Neurologic:  No gross focal sensory abnormalities; 5/5 muscle strength to upper and lower extremities. Speech appropriate. Cranial nerves intact                        Skin:  No rash or ulcers noted   Back:  no spinal or paraspinal muscle tenderness or rigidity, no CVA tenderness     Psychiatric:  No suicidal or homicidal ideations, appropriate mood and affect         Labs: Results:   Chemistry Recent Labs  10/20/15   1105  10/19/15   1730   GLU  116*  85   NA  138  138   K  4.8  5.1   CL  104  103   CO2  25  25   BUN  35*  50*   CREA  5.54*  7.26*   CA  8.3*  8.0*   7.7*   AGAP  9  10   BUCR  6*  7*      CBC w/Diff Recent Labs      10/20/15   1105  10/19/15   1730   WBC  2.2*  1.8*   RBC  2.61*  2.71*   HGB  7.7*  8.1*   HCT  25.6*  26.6*   PLT  77*  85*   GRANS  44  52   LYMPH  32  30   EOS  4  6*      Microbiology No results for input(s): CULT in the last 72 hours.       RADIOLOGY:    All available imaging studies/reports in connect care for this admission were reviewed    Dr. Raiford SimmondsManali Aliviah Spain, Infectious Disease Specialist  [phone]  October 21, 2015  12:12 PM

## 2015-10-21 NOTE — Anesthesia Pre-Procedure Evaluation (Addendum)
Anesthetic History     PONV          Review of Systems / Medical History  Patient summary reviewed, nursing notes reviewed and pertinent labs reviewed    Pulmonary                Comments: Had flu shot 2015   Neuro/Psych       CVA  TIA     Cardiovascular    Hypertension: poorly controlled      CHF          Comments: ECHO 10/18/2015-EF60%   GI/Hepatic/Renal         Renal disease: dialysis and ESRD       Endo/Other        Anemia     Other Findings   Comments: HIV positive.    Severe anemia, will transfuse 1 unit PRBC before procedure         Physical Exam    Airway  Mallampati: III  TM Distance: > 6 cm  Neck ROM: normal range of motion   Mouth opening: Normal     Cardiovascular    Rhythm: regular  Rate: normal         Dental    Dentition: Poor dentition     Pulmonary  Breath sounds clear to auscultation               Abdominal  GI exam deferred       Other Findings            Anesthetic Plan    ASA: 4  Anesthesia type: MAC          Induction: Intravenous  Anesthetic plan and risks discussed with: Patient

## 2015-10-21 NOTE — Other (Signed)
Per Dr. Ronne BinningMcKenzie patient's left chest port maybe used for blood administration.

## 2015-10-21 NOTE — Other (Signed)
Received shift report from Amy RN no distress noted call bell within reach patient laying awake in bed

## 2015-10-21 NOTE — Other (Signed)
Patient Demographics  ??   ?? Patient Name HAR Sex DOB Address Phone ??   ?? Tyler Ballard, Arash C [phone] Male 12-Sep-1981 [address]  [place] [id number] [phone] [facility](Home)  [phone] (Mobile) ??   ??   ?? CSN: ??   ?? [id number] ??   ??   ?? Admit Date: Admit Time Room Bed ??   ?? Oct 16, 2015 ??7:38 PM 360 [[id number]] 01 [[id number]] ??   ??   ?? Attending Providers  ??   ?? Provider Pager From To ??   ?? Thomes DinningVictor T Anglin, MD  10/16/15 10/17/15 ??   ?? Wanda Plumpirghayu Desai, MD  10/17/15           10/21/2015 12:04 PM EDT by Beatriz StallionMangiaratti, Michele ??    ?? ?? BP 204/92 ??   ??  ??   ?? (X) * Nausea and vomiting absent or improved ??   ?? (X) * Electrolyte and acid-base status at baseline or improved ??   ??  ??   ?? Activity ??   ?? ( ) Activity as tolerated ??   ?? 10/21/2015 12:04 PM EDT by Beatriz StallionMangiaratti, Michele ??   ?? ?? Heparin Sc q 8 hr, ??   ??  ??   ??  ??   ?? Routes ??   ?? (X) Parenteral or oral hydration, medications ??   ?? 10/21/2015 12:04 PM EDT by Beatriz StallionMangiaratti, Michele ??   ?? ?? PO;,Benadryl x4 within 24 hrs,Folvite qd,Lisinopril qd,Perccoet x4, IV:,Benadryl x2 ??   ??  ??   ?? (X) Renal diet as tolerated ??   ??  ??   ?? Interventions ??   ?? (X) Possible dialysis ??   ?? 10/21/2015 12:04 PM EDT by Beatriz StallionMangiaratti, Michele ??   ?? ?? H D ??3.5 hrs ??   ??  ??   ?? (X) Monitor electrolytes, glucose, acid-base, and volume status ??   ?? (X) Diet instruction ??   ??  ??   ?? Medications ??   ?? (X) Possible erythropoietin or darbepoetin, calcium supplement, phosphate binder, bicarbonate, vitamin D, antihypertensive medication ??   ?? 10/21/2015 12:04 PM EDT by Beatriz StallionMangiaratti, Michele ??   ?? ?? LO:VFIEPPV:Epogen x1,PO;Phoslo Tid, PO Catapres Bid. ??   ??  ??   ??  ??   ?? 10/21/2015 12:04 PM EDT by Beatriz StallionMangiaratti, Michele ??   ?? Subject: Additional Clinical Information ??   ?? T 98.6,P 90,BP 204/92,R 16,97% RA ??   ??   Labs:WBC 1.8,RBC 2.71,8.1/26.6,INR 1.3,PT 15.6,BUN 50,Creat 7.26,CA 7.7 ??   ??    ??   ??   Consult ID

## 2015-10-21 NOTE — Progress Notes (Signed)
Hospitalist Progress Note    Patient: Tyler Ballard MRN: [id number]  CSN: [id number]    Date of Birth: [birth year]  Age: 34 y.o.  Sex: male    DOA: 10/16/2015 LOS:  LOS: 5 days          Pt feels ok, declines any dizziness.  Pt again had bleeding from new HD cath, break in cath noted. No one used cath. Unclear with accidentally ripped it. He had lot of bleeding at bedside        Assessment/Plan     1. Non-functioning HD access - s/p HD catheter X 2 placement by vascular. Had bleeding from cath this am again due to break in cath. was taken to OR but vas couldn't open up clotted graft.   2. ESRD / HD per nephrology.   3. ? Sickle cell anemia: had neg Hb electrophoresis in 2014.   4. HTN: BP better: will cont ACE, catapres and norvasc  5. HIV/AIDS: asymptomatic, no infections: ID in put noted. Agree with prophylax tic Abx  6. Obesity Body mass index is 33.67 kg/(m^2).  7. pancytopenia in the setting of HIV, no need for oncology eval   8. Acute blood loss Anemia: got unit of blood in PACU. More blood with HD once cath in fixed   D/c Id and renal team        Case discussed with:  Patient  Family  Nursing  Case Management  DVT Prophylaxis:  Lovenox  Hep SQ  SCDs  Coumadin   On Heparin gtt    Vital signs/Intake and Output:  Visit Vitals   ??? BP 146/72 (BP 1 Location: Right arm, BP Patient Position: At rest)   ??? Pulse 90   ??? Temp 98.3 ??F (36.8 ??C)   ??? Resp 18   ??? Ht  (1.702 m)   ??? Wt 97.5 kg (215 lb)   ??? SpO2 99%   ??? BMI 33.67 kg/m2       Awake alert and oriented.  Ncat. Perrl. Poor dentition  R groin TDC with break in cath.  RRR  cta b.l  Soft nt nd nabs.  AVG.   No focal deficit  No rash    Medications Reviewed      Labs: Results:       Chemistry Recent Labs      10/21/15   0735  10/20/15   1105  10/19/15   1730   GLU  82  116*  85   NA  137  138  138   K  5.4  4.8  5.1   CL  104  104  103   CO2  BUN  48*  35*  50*   CREA  6.37*  5.54*  7.26*   CA  7.8*  8.3*  8.0*   7.7*   AGAP  BUCR  8*  6*  7*      CBC w/Diff Recent Labs      10/21/15   0735  10/20/15   1105  10/19/15   1730   WBC  2.5*  2.2*  1.8*   RBC  2.03*  2.61*  2.71*   HGB  6.2*  7.7*  8.1*   HCT  19.7*  25.6*  26.6*   PLT  93*  77*  85*   GRANS  54  44  52   LYMPH  27  32  30   EOS  7*  4  6*      Cardiac Enzymes No results for input(s): CPK, CKND1, MYO in the last 72 hours.    No lab exists for component: CKRMB, TROIP   Coagulation Recent Labs      10/21/15   0735  10/20/15   1105   PTP  15.9*  15.4*   INR  1.3*  1.3*   APTT   --   40.2*       Lipid Panel Lab Results   Component Value Date/Time    CHOLESTEROL, TOTAL 85 11/29/2012 09:36 AM    HDL CHOLESTEROL 33 11/29/2012 09:36 AM    LDL, CALCULATED 41.2 11/29/2012 09:36 AM    VLDL, CALCULATED 10.8 11/29/2012 09:36 AM    TRIGLYCERIDE 54 11/29/2012 09:36 AM    CHOL/HDL RATIO 2.6 11/29/2012 09:36 AM      BNP No results for input(s): BNPP in the last 72 hours.   Liver Enzymes No results for input(s): TP, ALB, TBIL, AP, SGOT, GPT in the last 72 hours.    No lab exists for component: DBIL   Thyroid Studies No results found for: T4, T3U, TSH, TSHEXT, TSHEXT     Procedures/imaging: see electronic medical records for all procedures/Xrays and details which were not copied into this note but were reviewed prior to creation of Plan.

## 2015-10-22 LAB — HIT PANEL
HEPARIN AGGREGATION: NEGATIVE
PLATELET FACTOR 4: UNDETERMINED

## 2015-10-22 MED ORDER — PROMETHAZINE 25 MG/ML INJECTION
25 mg/mL | INTRAMUSCULAR | Status: AC
Start: 2015-10-22 — End: 2015-10-22
  Administered 2015-10-22: 13:00:00

## 2015-10-22 MED ORDER — TRIMETHOPRIM-SULFAMETHOXAZOLE 80 MG-400 MG TAB
80-400 mg | Freq: Once | ORAL | Status: AC
Start: 2015-10-22 — End: 2015-10-22
  Administered 2015-10-22: 22:00:00 via ORAL

## 2015-10-22 MED ORDER — SODIUM CHLORIDE 0.9 % IV PIGGY BACK
INTRAVENOUS | Status: AC
Start: 2015-10-22 — End: 2015-10-22
  Administered 2015-10-22: 19:00:00

## 2015-10-22 MED ORDER — HYDROMORPHONE (PF) 1 MG/ML IJ SOLN
1 mg/mL | INTRAMUSCULAR | Status: DC | PRN
Start: 2015-10-22 — End: 2015-10-22
  Administered 2015-10-22 (×3): via INTRAVENOUS

## 2015-10-22 MED ORDER — PROMETHAZINE 25 MG/ML INJECTION
25 mg/mL | INTRAMUSCULAR | Status: AC
Start: 2015-10-22 — End: 2015-10-23
  Administered 2015-10-22: 19:00:00

## 2015-10-22 MED ORDER — SODIUM CHLORIDE 0.9 % IV PIGGY BACK
INTRAVENOUS | Status: AC
Start: 2015-10-22 — End: 2015-10-22
  Administered 2015-10-22: 14:00:00

## 2015-10-22 MED ORDER — HYDROMORPHONE 0.5 MG/0.5 ML SYRINGE
0.5 mg/ mL | INTRAMUSCULAR | Status: DC | PRN
Start: 2015-10-22 — End: 2015-10-24
  Administered 2015-10-22 – 2015-10-24 (×8): via INTRAVENOUS

## 2015-10-22 MED ORDER — SODIUM CHLORIDE 0.9 % IV PIGGY BACK
INTRAVENOUS | Status: AC
Start: 2015-10-22 — End: 2015-10-22
  Administered 2015-10-22: 13:00:00

## 2015-10-22 MED ORDER — PROMETHAZINE 25 MG/ML INJECTION
25 mg/mL | INTRAMUSCULAR | Status: AC
Start: 2015-10-22 — End: 2015-10-22
  Administered 2015-10-22: 14:00:00

## 2015-10-22 MED FILL — PROMETHAZINE 25 MG/ML INJECTION: 25 mg/mL | INTRAMUSCULAR | Qty: 1

## 2015-10-22 MED FILL — FAMOTIDINE 20 MG TAB: 20 mg | ORAL | Qty: 1

## 2015-10-22 MED FILL — CLONIDINE 0.1 MG TAB: 0.1 mg | ORAL | Qty: 2

## 2015-10-22 MED FILL — SODIUM CHLORIDE 0.9 % IV PIGGY BACK: INTRAVENOUS | Qty: 50

## 2015-10-22 MED FILL — HYDROMORPHONE (PF) 1 MG/ML IJ SOLN: 1 mg/mL | INTRAMUSCULAR | Qty: 1

## 2015-10-22 MED FILL — CALCIUM ACETATE 667 MG CAP: 667 mg | ORAL | Qty: 1

## 2015-10-22 MED FILL — TRIMETHOPRIM-SULFAMETHOXAZOLE 80 MG-400 MG TAB: 80-400 mg | ORAL | Qty: 1

## 2015-10-22 MED FILL — MORPHINE 2 MG/ML INJECTION: 2 mg/mL | INTRAMUSCULAR | Qty: 1

## 2015-10-22 MED FILL — FENTANYL CITRATE (PF) 50 MCG/ML IJ SOLN: 50 mcg/mL | INTRAMUSCULAR | Qty: 2

## 2015-10-22 MED FILL — LISINOPRIL 10 MG TAB: 10 mg | ORAL | Qty: 1

## 2015-10-22 MED FILL — HYDROXYUREA 500 MG CAPSULE: 500 mg | ORAL | Qty: 1

## 2015-10-22 MED FILL — FOLIC ACID 1 MG TAB: 1 mg | ORAL | Qty: 1

## 2015-10-22 MED FILL — HYDROMORPHONE 0.5 MG/0.5 ML SYRINGE: 0.5 mg/ mL | INTRAMUSCULAR | Qty: 0.5

## 2015-10-22 MED FILL — DIPHENHYDRAMINE 25 MG CAP: 25 mg | ORAL | Qty: 1

## 2015-10-22 NOTE — Other (Signed)
Patient Demographics  ??   ?? Patient Name HAR Sex DOB Address Phone ??   ?? Tyler Ballard, Tyler Ballard [phone] Male June 21, 1981 [address]  [place] [id number] [phone] [facility](Home)  [phone] (Mobile) ??   ??   ?? CSN: ??   ?? [id number] ??   ??   ?? Admit Date: Admit Time Room Bed ??   ?? Oct 16, 2015 ??7:38 PM 360 [[id number]][[id number]] 01 [[id number]] ??   ??   ?? Attending Providers  ??   ?? Provider Pager From To ??   ?? Thomes DinningVictor T Anglin, MD  10/16/15 10/17/15 ??   ?? Wanda Plumpirghayu Desai, MD  10/17/15           Utilization Review         ?? Renal Failure, Chronic - Care Day 7 (10/22/2015) by Sallyanne KusterMichele A Mangiaratti  ??   ?? Review Status Review Entered ??   ?? Completed 10/22/2015 ??   ?? Details ??   ??  ??   ?? Care Day: 7 Care Date: 10/22/2015 Level of Care: Telemetry ??   ?? Guideline Day 3  ??   ?? Level Of Care ??   ?? (X) Floor ??   ??  ??   ?? Clinical Status ??   ?? (X) * Mental status at baseline ??   ?? (X) * Pulmonary edema absent or improved ??   ??  ??   ?? Activity ??   ?? ( ) * Ambulatory ??   ??  ??   ?? Routes ??   ?? (X) * Oral hydration, medications ??   ?? 10/22/2015 2:09 PM EDT by Beatriz StallionMangiaratti, Michele ??   ?? ?? po;Benadryl x1,Bactrim /Septra ??x1, ??   ??  ??   ?? ( ) Renal diet as tolerated ??   ?? 10/22/2015 2:09 PM EDT by Mangiaratti, Elon JesterMichele ??   ?? ?? NPO >OR ??   ??  ??   ??  ??   ?? Interventions ??   ?? (X) Possible dialysis ??   ?? (X) Possibly establish permanent access for dialysis ??   ?? ( ) Monitor electrolytes, glucose, acid-base, and volume status ??   ?? 10/22/2015 2:09 PM EDT by Beatriz StallionMangiaratti, Michele ??   ?? ?? No labs ordered ??   ??  ??   ??  ??   ?? Medications ??   ?? (X) Possible erythropoietin or darbepoetin, calcium supplement, phosphate binder, bicarbonate, vitamin D, antihypertensive medication ??   ?? 10/22/2015 2:09 PM EDT by Beatriz StallionMangiaratti, Michele ??   ?? ?? Po;Phoslo Tid,Catapres 0.2 mg ??Bid,Lisinopril qd, ??   ??  ??   ??  ??   ?? 10/22/2015 2:09 PM EDT by Beatriz StallionMangiaratti, Michele ??   ?? Subject: Additional Clinical Information ??   ?? T 98.6,P 104,162/87,R 18,96% 2 L NC ??   ??   ???? ??   ??    ????NO Labs Noted Today.  Meds: IV;Fentanyl x1,Morphine x1,Phenergan x1, ??   ??  ??   ??  ??   ??  ??   ??  ??   ?? * Milestone ??   ??  ??   ?? Additional Notes ??   ?? Concurrent Review 10/22/15 ??   ??  ??   ?? Pending MD Notes ??   ??  ??   ?? OPERATIVE REPORT 10/22/15 ??   ??  ??   ?? PREOPERATIVE DIAGNOSES  ??   ?? 1. Renal failure.  ??   ??  2. Bilateral subclavian occlusion.  ??   ?? 3. Broken dialysis catheter with bleed, right groin.  ??   ?? POSTOPERATIVE DIAGNOSES  ??   ?? 1. Renal failure.  ??   ?? 2. Bilateral subclavian occlusion.  ??   ?? 3. Broken dialysis catheter with bleed, right groin.  ??   ?? PROCEDURES PERFORMED  ??   ?? 1. Ultrasound evaluation bilateral jugular veins.  ??   ?? 2. Venogram via left jugular vein access.  ??   ?? 3. Central venogram with interpretation.  ??   ?? 4. Removal of right femoral tunneled dialysis catheter.  ??   ?? ESTIMATED BLOOD LOSS: 0.  ??   ?? SPECIMENS REMOVED: fem cath  ??   ?? ANESTHESIA:  ??   ?? SURGEON: [place] M. McKenzie, DO.  ??   ?? COMPLICATIONS: 0.  ??   ?? CONDITION OF THE PATIENT: Stable.  ??   ?? INDICATIONS FOR PROCEDURE: The patient is a complex 34 year old, came to  ??   ?? the hospital via incarceration service. His history is difficult to obtain.  ??   ?? He tells the renal team that he has not had dialysis for 2 to 3 weeks. He  ??   ?? has a port for infusion on his chest wall that he does not use for  ??   ?? dialysis. He has had since being hospitalized 2 urgently-placed right  ??   ?? femoral catheters, the first placed on Sunday, mysteriously became broken  ??   ?? and hemorrhagic; then a second one placed yesterday, also  ??   ?? mysteriously broke in half overnight and quite hemorrhagic. He has been  ??   ?? transfused today. The catheter had been clamped. He is not actively  ??   ?? bleeding, he has a clotted left arm graft. Today's plan was to establish  ??   ?? some type of dialysis access . ??   ??  ??   ?? DESCRIPTION OF PROCEDURE: He had an antibiotic, was transferred to the ??    ?? room, where he had moderate sedation. First, his neck and chest wall were ??   ?? prepped and draped in the usual standard fashion CDC compliant ??   ?? guidelines for aseptic technique. I placed an ultrasound on his right IJ. I did ??   ?? define a compressible IJ lumen. The carotid was patent and pulsatile. I ??   ?? accessed venous access with a 19 needle and looked with ultrasound. Wire ??   ?? progress could not be made; in fact, there is a stent going from ??   ?? the subclavian to the superior vena cava on the right, so I abandoned this ??   ?? side. On the left side, there was similarly a dialysis catheter placed into ??   ?? the left subclavian, a long stent extending from the arm to past ??   ?? the thoracic outlet into the subclavian and innominate vein. The IJ had no ??   ?? accessto central the ultrasound showed IJ was dilated and compressible, accessed with ??   ?? a 19 needle and did a venogram. His IJ collateralizes, the central ??   ?? communication is completely occluded. It appears that his prior dialysis ??   ?? cath have done this subclavian access. He also unfortunately has a ??   ?? subclavian stent, likely chronic occlusions in nature. This has been his ??   ??  dialysis source. No doubt I suspect there is some type of ??   ?? malingering versus poor history unsure with his background, and then also these ??   ?? broken hospital catheters repeatedly. But nonetheless, the catheter he has ??   ?? flushes and aspirates very nicely and flushed with concentrated heparin solution, ??   ?? removed the sutures, placed 2 new sutures and cleaned up the site. He can ??   ?? use this for dialysis access. A sterile dressing was applied. ??   ?? ?? ??   ?? I took attention to the right groin. There was indeed a catheter, was ??   ?? cracked in half where it bifurcates. I removed these sutures ??   ?? and removed the catheter and held direct pressure for hemostasis, also ??   ?? placed a Prolene suture. Going forward for dialysis access options, need ??    ?? to consider if a Hero catheter could be connected to this. I am unsure if ??   ?? this can be done. It looks like there is a stent and catheter at the same ??   ?? site. Certainly has limitations in his access, possibly a thigh access. ??   ?? Will discuss this with his renal team, certainly right he ??   ?? is due transfusion and dialysis, which has been lacking, per the patient, ??   ?? for several weeks. ??   ?? ?? ??   ?? He was transferred out of the room in stable condition.

## 2015-10-22 NOTE — Progress Notes (Signed)
Complex patient with B subclavian stents  Has a HD cath he can use  Will need venogram to determine if can have upper arm access  Consider herograft  Will get doppler as well

## 2015-10-22 NOTE — Progress Notes (Signed)
Hospitalist Progress Note    Patient: Tyler Ballard MRN: [id number]  CSN: [id number]    Date of Birth: [birth year]  Age: 34 y.o.  Sex: male    DOA: 10/16/2015 LOS:  LOS: 6 days          Pt feels ok, declines any dizziness or SOB.  Refused HD but later agreed but HD couldn't be done due to non function cath due to break.     Assessment/Plan     1. Non-functioning HD access - s/p HD catheter X 2 placement by vascular and both had break and cant use them. vas couldn't open up clotted graft. For new cath vs opening up of graft in am in OR   2. ESRD / HD per nephrology.   3. ? Sickle cell anemia: had neg Hb electrophoresis in 2014.   4. HTN: BP better: repeat BP 130's. will cont ACE, catapres and norvasc  5. HIV/AIDS: asymptomatic, no infections: ID in put noted. Agree with prophylaxtic Abx  6. Obesity Body mass index is 34.49 kg/(m^2).  7. pancytopenia in the setting of HIV, no need for oncology eval   8. Acute blood loss Anemia: will 2 units with HD once cath in fixed   D/w renal team in detail        Case discussed with:  Patient  Family  Nursing  Case Management  DVT Prophylaxis:  Lovenox  Hep SQ  SCDs  Coumadin   On Heparin gtt    Vital signs/Intake and Output:  Visit Vitals   ??? BP 176/82 (BP 1 Location: Right arm, BP Patient Position: At rest)   ??? Pulse 82   ??? Temp 98.4 ??F (36.9 ??C)   ??? Resp 18   ??? Ht  (1.702 m)   ??? Wt 99.9 kg (220 lb 3.2 oz)   ??? SpO2 98%   ??? BMI 34.49 kg/m2       Awake alert and oriented.  Ncat. Perrl. Poor dentition  R groin TDC removed.  RRR  cta b.l  Soft nt nd nabs.  AVG.   No focal deficit  No rash    Medications Reviewed      Labs: Results:       Chemistry Recent Labs      10/21/15   0735  10/20/15   1105  10/19/15   1730   GLU  82  116*  85   NA  137  138  138   K  5.4  4.8  5.1   CL  104  104  103   CO2  BUN  48*  35*  50*   CREA  6.37*  5.54*  7.26*   CA  7.8*  8.3*  8.0*   7.7*   AGAP  BUCR  8*  6*  7*      CBC w/Diff Recent Labs      10/21/15    0735  10/20/15   1105  10/19/15   1730   WBC  2.5*  2.2*  1.8*   RBC  2.03*  2.61*  2.71*   HGB  6.2*  7.7*  8.1*   HCT  19.7*  25.6*  26.6*   PLT  93*  77*  85*   GRANS  54  44  52   LYMPH  27  32  30   EOS  7*  4  6*  Cardiac Enzymes No results for input(s): CPK, CKND1, MYO in the last 72 hours.    No lab exists for component: CKRMB, TROIP   Coagulation Recent Labs      10/21/15   0735  10/20/15   1105   PTP  15.9*  15.4*   INR  1.3*  1.3*   APTT   --   40.2*       Lipid Panel Lab Results   Component Value Date/Time    CHOLESTEROL, TOTAL 85 11/29/2012 09:36 AM    HDL CHOLESTEROL 33 11/29/2012 09:36 AM    LDL, CALCULATED 41.2 11/29/2012 09:36 AM    VLDL, CALCULATED 10.8 11/29/2012 09:36 AM    TRIGLYCERIDE 54 11/29/2012 09:36 AM    CHOL/HDL RATIO 2.6 11/29/2012 09:36 AM      BNP No results for input(s): BNPP in the last 72 hours.   Liver Enzymes No results for input(s): TP, ALB, TBIL, AP, SGOT, GPT in the last 72 hours.    No lab exists for component: DBIL   Thyroid Studies No results found for: T4, T3U, TSH, TSHEXT, TSHEXT     Procedures/imaging: see electronic medical records for all procedures/Xrays and details which were not copied into this note but were reviewed prior to creation of Plan.

## 2015-10-22 NOTE — Op Note (Signed)
[facility]                               [address] [place], [place] [address]                                 OPERATIVE REPORT    PATIENT:     Tyler Ballard, Tyler Ballard  MRN:            [id number]   DATE:   10/21/2015  BILLING:     [id number]  ROOM:        [id number]  ATTENDING:   Wanda Plump, MD  DICTATING:   [place] Citlaly Camplin, DO      PREOPERATIVE DIAGNOSES  1. Renal failure.  2. Bilateral subclavian occlusion.  3. Broken dialysis catheter with bleed, right groin.    POSTOPERATIVE DIAGNOSES  1. Renal failure.  2. Bilateral subclavian occlusion.  3. Broken dialysis catheter with bleed, right groin.    PROCEDURES PERFORMED  1. Ultrasound evaluation bilateral jugular veins.  2. Venogram via left jugular vein access.  3. Central venogram with interpretation.  4. Removal of right femoral tunneled dialysis catheter.    ESTIMATED BLOOD LOSS: 0.    SPECIMENS REMOVED: fem cath    ANESTHESIA:    SURGEON: [place] M. Marimar Suber, DO.    COMPLICATIONS: 0.    CONDITION OF THE PATIENT: Stable.    INDICATIONS FOR PROCEDURE: The patient is a complex 34 year old, came to  the hospital via incarceration service. His history is difficult to obtain.  He tells the renal team that he has not had dialysis for 2 to 3 weeks. He  has a port for infusion on his chest wall that he does not use for  dialysis. He has had since being hospitalized 2 urgently-placed right  femoral catheters, the first placed on Sunday, mysteriously became broken  and hemorrhagic; then a second one placed yesterday, also  mysteriously broke in half overnight and quite hemorrhagic. He has been  transfused today. The catheter had been clamped. He is not actively  bleeding, he has a clotted left arm graft. Today's plan was to establish  some type of dialysis access .    DESCRIPTION OF PROCEDURE: He had an antibiotic, was transferred to the  room, where he had moderate sedation. First, his neck and chest  wall were  prepped and draped in the usual standard fashion  CDC compliant  guidelines for aseptic technique. I placed an ultrasound on his right IJ. I did  define a compressible IJ lumen. The carotid was patent and pulsatile. I  accessed venous access with a 19 needle and looked with ultrasound. Wire  progress could not be made; in fact, there is a stent going from  the subclavian to the superior vena cava on the right, so I abandoned this  side. On the left side, there was similarly a dialysis catheter placed into  the left subclavian, a long stent extending from the arm to past  the thoracic outlet into the subclavian and innominate vein. The IJ had no  accessto central the ultrasound showed IJ was dilated and compressible, accessed with  a 19 needle and did a venogram. His IJ collateralizes, the central  communication  is completely occluded. It appears that his prior dialysis  cath have done this subclavian access. He also unfortunately has a  subclavian stent, likely chronic occlusions in nature. This has been his  dialysis source. No doubt I suspect there is some type of  malingering versus poor history unsure with his background, and then also these  broken hospital catheters repeatedly. But nonetheless, the catheter he has  flushes and aspirates very nicely and flushed with concentrated heparin solution,  removed the sutures, placed 2 new sutures and cleaned up the site. He can  use this for dialysis access. A sterile dressing was applied.    I took attention to the right groin. There was indeed a catheter, was  cracked in half  where it bifurcates. I removed these sutures  and removed the catheter and held direct pressure for hemostasis, also  placed a Prolene suture. Going forward for dialysis access options, need  to consider if a Hero catheter could be connected to this. I am unsure if  this can be done. It looks like there is a stent and catheter at the same  site. Certainly has limitations in his  access, possibly a thigh access.  Will discuss this with his renal team, certainly right he  is due transfusion and dialysis, which has been lacking, per the patient,  for several weeks.    He was transferred out of the room in stable condition.                     [place] Brandom Kerwin, DO    CM:[id number]  D: 10/21/2015 T: 10/22/2015 02:46 A  Job: [id number]  CScriptDoc #: [id number]  cc:   Wanda PlumpIRGHAYU DESAI, MD        [place]alyHAD Poonam Woehrle, DO

## 2015-10-22 NOTE — Progress Notes (Signed)
Pnt in dialysis @ this time. Pnt was verbal alert and oriented. RR even and nonlabored @ time of transport. Pnt stable. NADN

## 2015-10-22 NOTE — Other (Signed)
Patient Demographics  ??   ?? Patient Name HAR Sex DOB Address Phone ??   ?? Tyler Ballard, Tyler Ballard [id number] Male 16-Sep-1981 [address]  [place] [address] [phone] [facility])  [phone] (Mobile) ??   ??   ?? CSN: ??   ?? [id number] ??   ??   ?? Admit Date: Admit Time Room Bed ??   ?? Oct 16, 2015 ??7:38 PM 360 [01027] 01 [[id number]] ??   ??   ?? Attending Providers  ??   ?? Provider Pager From To ??   ?? Thomes Dinning, MD  10/16/15 10/17/15 ??   ?? Wanda Plump, MD  10/17/15           Care Day: 7 Care Date: 10/22/2015 Level of Care: Telemetry ??    ?? Guideline Day 3  ??   ?? Level Of Care ??   ?? (X) Floor ??   ??  ??   ?? Clinical Status ??   ?? (X) * Mental status at baseline ??   ?? (X) * Pulmonary edema absent or improved ??   ??  ??   ?? Activity ??   ?? ( ) * Ambulatory ??   ??  ??   ?? Routes ??   ?? (X) * Oral hydration, medications ??   ?? 10/22/2015 2:09 PM EDT by Beatriz Stallion ??   ?? ?? po;Benadryl x1,Bactrim /Septra ??x1, ??   ??  ??   ?? ( ) Renal diet as tolerated ??   ?? 10/22/2015 2:09 PM EDT by Mangiaratti, Elon Jester ??   ?? ?? NPO >OR ??   ??  ??   ??  ??   ?? Interventions ??   ?? (X) Possible dialysis ??   ?? (X) Possibly establish permanent access for dialysis ??   ?? ( ) Monitor electrolytes, glucose, acid-base, and volume status ??   ?? 10/22/2015 2:09 PM EDT by Beatriz Stallion ??   ?? ?? No labs ordered ??   ??  ??   ??  ??   ?? Medications ??   ?? (X) Possible erythropoietin or darbepoetin, calcium supplement, phosphate binder, bicarbonate, vitamin D, antihypertensive medication ??   ?? 10/22/2015 2:09 PM EDT by Beatriz Stallion ??   ?? ?? Po;Phoslo Tid,Catapres 0.2 mg ??Bid,Lisinopril qd, ??   ??  ??   ??  ??   ?? 10/23/2015 10:45 AM EDT by Beatriz Stallion ??   ?? Subject: Additional Clinical Information ??   ?? IV: Phenergan total x3,IV Fentanyl x2 total given,Dilaudid IV x2 ??   ??  ??   ??  ??   ?? 10/22/2015 2:09 PM EDT by Beatriz Stallion ??   ?? Subject: Additional Clinical Information ??   ?? T 98.6,P 104,162/87,R 18,96% 2 L NC ??   ??   ???? ??   ??    ????NO Labs Noted Today.  Meds: IV;Fentanyl x1,Morphine x1,Phenergan x1, ??   ??  ??   ??  ??   ??  ??   ??  ??   ?? * Milestone ??   ??  ??   ?? Additional Notes ??   ?? Concurrent Review 10/22/15 ??   ??  ??   ??  ??   ?? Dialysis Note ??   ?? Pt arrived to dialysis room. Alert and oriented x 4. Accessed via left HD cath, soon as aspirated and flushed HD cath, small tear noted and blood splashed. Called Dr. Ardelle Park, made  aware. Arrived at bedside to assess HD cath. Catheter clamped x2 above tear, wrapped in gauze and secured with tape. ??   ??  ??   ??  ??   ?? Hospitalist Progress Note ??   ??  ??   ?? Pt feels ok, declines any dizziness or SOB.  ??   ?? Refused HD but later agreed but HD couldn't be done due to non-function cath due to break.  ??   ?? Assessment/Plan ??   ?? 1. Non-functioning HD access - s/p HD catheter X 2 placement by vascular and both had break and cant use them. vas couldn't open up clotted graft. For new cath vs opening up of graft in am in OR  ??   ?? 2. ESRD / HD per nephrology.  ??   ?? 3. ? Sickle cell anemia: had neg Hb electrophoresis in 2014.  ??   ?? 4. HTN: BP better: repeat BP 130's. will cont ACE, catapres and norvasc  ??   ?? 5. HIV/AIDS: asymptomatic, no infections: ID in put noted. Agree with prophylaxtic Abx  ??   ?? 6. Obesity Body mass index is 34.49 kg/(m^2).  ??   ?? 7. pancytopenia in the setting of HIV, no need for oncology eval  ??   ?? 8. Acute blood loss Anemia: will 2 units with HD once cath in fixed  ??   ?? D/w renal team in detail  ??   ??  ??   ?? R groin TDC removed ??   ??  ??   ??  ??   ??  ??   ?? Vascular Surgery  ??   ?? Complex patient with B subclavian stents  ??   ?? Has a HD cath he can use  ??   ?? Will need venogram to determine if can have upper arm access  ??   ?? Consider herograft  ??   ?? Will get doppler as well  ??   ??  ??   ??  ??   ??  ??   ?? Progress Note ??Nephrology ??   ??  ??   ?? Active Problems:  ??   ?? Sickle cell anemia with pain (HCC) (07/05/2012) POA: Yes  ??    ?? ESRD (end stage renal disease) on dialysis (HCC) (07/06/2012) POA: Yes  ??   ?? HTN (hypertension) (07/06/2012) POA: Yes  ??   ?? Renal failure (10/16/2015) POA: Unknown  ??   ?? HIV (human immunodeficiency virus infection) (HCC) (10/17/2015) POA: Unknown  ??   ?? Secondary hyperparathyroidism of renal origin (HCC) (10/18/2015) POA: Yes  ??   ??  ??   ?? Subjective:  ??   ?? Patient refused to be dialyzed through (R J TDC last night & again today several times , subsequently he agreed to come . Surprisingly again found to have crack on the lower end of new dialysis catheter  ??   ?? That was placed last night, could not Be dialyzed as blood was coming out through catheter leak,had to clamp the catheter.  ??   ?? He claims Vascular surgery told him last night That he will have Hero catheter tomorrow.  ??   ??  ??   ?? PE: Extremities: Extremities normal, atraumatic, no cyanosis or edema, clotted AVG on left forearm, has catheter on (L) IJ, now clamped..  ??   ??  ??   ?? Impression:  ??   ??  ??   ??  ??   ??  Active Hospital Problems  ??   ?? Diagnosis Date Noted  ??   ?? o Secondary hyperparathyroidism of renal origin (HCC) 10/18/2015  ??   ?? o HIV (human immunodeficiency virus infection) (HCC) 10/17/2015  ??   ?? o Renal failure 10/16/2015  ??   ?? o ESRD (end stage renal disease) on dialysis (HCC) 07/06/2012  ??   ?? o HTN (hypertension) 07/06/2012  ??   ?? o Sickle cell anemia with pain (HCC) 07/05/2012  ??   ??  ??   ?? No functional Access for dialysis.  ??   ?? Plan:  ??   ?? Notified Vascular surgeon's office told him if He does not have functional dialysis then his Life will be at high risk. Check lab. ??   ??  ??   ??  ??   ??  ??   ??  ??   ??  ??   ??  ??   ??  ??   ??  ??   ?? Pending MD Notes ??   ??  ??   ?? OPERATIVE REPORT 10/22/15 ??   ??  ??   ?? PREOPERATIVE DIAGNOSES  ??   ?? 1. Renal failure.  ??   ?? 2. Bilateral subclavian occlusion.  ??   ?? 3. Broken dialysis catheter with bleed, right groin.  ??   ?? POSTOPERATIVE DIAGNOSES  ??   ?? 1. Renal failure.  ??    ?? 2. Bilateral subclavian occlusion.  ??   ?? 3. Broken dialysis catheter with bleed, right groin.  ??   ?? PROCEDURES PERFORMED  ??   ?? 1. Ultrasound evaluation bilateral jugular veins.  ??   ?? 2. Venogram via left jugular vein access.  ??   ?? 3. Central venogram with interpretation.  ??   ?? 4. Removal of right femoral tunneled dialysis catheter.  ??   ?? ESTIMATED BLOOD LOSS: 0.  ??   ?? SPECIMENS REMOVED: fem cath  ??   ?? ANESTHESIA:  ??   ?? SURGEON: [place] M. McKenzie, DO.  ??   ?? COMPLICATIONS: 0.  ??   ?? CONDITION OF THE PATIENT: Stable.  ??   ?? INDICATIONS FOR PROCEDURE: The patient is a complex 34 year old, came to  ??   ?? the hospital via incarceration service. His history is difficult to obtain.  ??   ?? He tells the renal team that he has not had dialysis for 2 to 3 weeks. He  ??   ?? has a port for infusion on his chest wall that he does not use for  ??   ?? dialysis. He has had since being hospitalized 2 urgently-placed right  ??   ?? femoral catheters, the first placed on Sunday, mysteriously became broken  ??   ?? and hemorrhagic; then a second one placed yesterday, also  ??   ?? mysteriously broke in half overnight and quite hemorrhagic. He has been  ??   ?? transfused today. The catheter had been clamped. He is not actively  ??   ?? bleeding, he has a clotted left arm graft. Today's plan was to establish  ??   ?? some type of dialysis access . ??   ??  ??   ?? DESCRIPTION OF PROCEDURE: He had an antibiotic, was transferred to the ??   ?? room, where he had moderate sedation. First, his neck and chest wall were ??   ?? prepped and draped in the usual  standard fashion CDC compliant ??   ?? guidelines for aseptic technique. I placed an ultrasound on his right IJ. I did ??   ?? define a compressible IJ lumen. The carotid was patent and pulsatile. I ??   ?? accessed venous access with a 19 needle and looked with ultrasound. Wire ??   ?? progress could not be made; in fact, there is a stent going from ??    ?? the subclavian to the superior vena cava on the right, so I abandoned this ??   ?? side. On the left side, there was similarly a dialysis catheter placed into ??   ?? the left subclavian, a long stent extending from the arm to past ??   ?? the thoracic outlet into the subclavian and innominate vein. The IJ had no ??   ?? accessto central the ultrasound showed IJ was dilated and compressible, accessed with ??   ?? a 19 needle and did a venogram. His IJ collateralizes, the central ??   ?? communication is completely occluded. It appears that his prior dialysis ??   ?? cath have done this subclavian access. He also unfortunately has a ??   ?? subclavian stent, likely chronic occlusions in nature. This has been his ??   ?? dialysis source. No doubt I suspect there is some type of ??   ?? malingering versus poor history unsure with his background, and then also these ??   ?? broken hospital catheters repeatedly. But nonetheless, the catheter he has ??   ?? flushes and aspirates very nicely and flushed with concentrated heparin solution, ??   ?? removed the sutures, placed 2 new sutures and cleaned up the site. He can ??   ?? use this for dialysis access. A sterile dressing was applied. ??   ?? ?? ??   ?? I took attention to the right groin. There was indeed a catheter, was ??   ?? cracked in half where it bifurcates. I removed these sutures ??   ?? and removed the catheter and held direct pressure for hemostasis, also ??   ?? placed a Prolene suture. Going forward for dialysis access options, need ??   ?? to consider if a Hero catheter could be connected to this. I am unsure if ??   ?? this can be done. It looks like there is a stent and catheter at the same ??   ?? site. Certainly has limitations in his access, possibly a thigh access. ??   ?? Will discuss this with his renal team, certainly right he ??   ?? is due transfusion and dialysis, which has been lacking, per the patient, ??   ?? for several weeks. ??   ?? ?? ??    ?? He was transferred out of the room in stable condition. ??   ?? ?? ??   ?? ??

## 2015-10-22 NOTE — Other (Signed)
IDR/SLIDR Summary          Patient: Delane Gingerlbert C Berroa MRN: [id number]    Age: 34 y.o.     Birthdate: [birth year] Room/Bed: 360/01   Admit Diagnosis: Renal failure  ESRD (end stage renal disease) (HCC)  DX  DX  Principal Diagnosis: <principal problem not specified>     Goals: pain control  Readmission: YES  Quality Measure: COPD CHF  VTE Prophylaxis: Chemical  Influenza Vaccine screening completed? YES ( PLEASE GIVE)  Pneumococcal Vaccine screening completed? YES  Mobility needs: Yes   Nutrition plan:No  Consults:P.T    Financial concerns:Yes  Escalated to CM? YES  RRAT Score: 25   Interventions SNF  Testing due for pt today? NO  LOS: 6 days Expected length of stay 5 days  Discharge plan: SNF   PCP: Phys Other, MD  Transportation needs: Yes    Days before discharge:two or more days before discharge   Discharge disposition: SNF    Signed:     Raynelle FanningSherri L Smalls, RN  10/22/2015  9:53 AM

## 2015-10-22 NOTE — Other (Signed)
Pt arrived to dialysis room. Alert and oriented x 4. Accessed via left HD cath, soon as aspirated and flushed HD cath, small tear noted and blood splashed. Called Dr. Ardelle ParkHaque, made aware. Arrived at bedside to assess HD cath. Catheter clamped x2 above tear, wrapped in gauze and secured with tape.

## 2015-10-22 NOTE — Progress Notes (Signed)
Hourly rounding complete.   Safety  Pt resting   [x  ]  On stretcher with side rails up and bed in locked position, call bell within reach  [  ]  Sitting in chair with casters locked, call bell within reach    Toileting  [ x ] pt denies need to use bathroom  [  ] pt assisted to bathroom  [  ] pt assisted with bedpan  [  ] pt independent to bathroom as needed    Ongoing Plan of Care  Plan of care and expected time for test and results reviewed with pt.    Pain Management / Comfort  [ x ] dimmed lights  [  ] warm blanket provided  [ x ] pain assessed  [  ] monitor alarms reviewed

## 2015-10-22 NOTE — Progress Notes (Signed)
Progress Note    Tyler Ballard  34 y.o.      Admit Date: 10/16/2015  Active Problems:    Sickle cell anemia with pain (HCC) (07/05/2012) POA: Yes      ESRD (end stage renal disease) on dialysis (HCC) (07/06/2012) POA: Yes      HTN (hypertension) (07/06/2012) POA: Yes      Renal failure (10/16/2015) POA: Unknown      HIV (human immunodeficiency virus infection) (HCC) (10/17/2015) POA: Unknown      Secondary hyperparathyroidism of renal origin (HCC) (10/18/2015) POA: Yes            Subjective:     Patient refused to be dialyzed through (R  J TDC last night & again today several times , subsequently he agreed to come . Surprisingly again found to have crack on the lower end of new dialysis catheter  That was placed last night, could  not  Be dialyzed as blood was coming out through catheter leak,had to clamp the catheter.  He claims Vascular surgery told him last night  That he will have Hero catheter tomorrow.      A comprehensive review of systems was negative except for that written in the History of Present Illness.    Objective:     Visit Vitals   ??? BP 176/82 (BP 1 Location: Right arm, BP Patient Position: At rest)   ??? Pulse 82   ??? Temp 98.4 ??F (36.9 ??C)   ??? Resp 18   ??? Ht  (1.702 m)   ??? Wt 99.9 kg (220 lb 3.2 oz)   ??? SpO2 98%   ??? BMI 34.49 kg/m2         Intake/Output Summary (Last 24 hours) at 10/22/15 1620  Last data filed at 10/22/15 1610   Gross per 24 hour   Intake    580 ml   Output      0 ml   Net    580 ml       Current Facility-Administered Medications   Medication Dose Route Frequency Provider Last Rate Last Dose   ??? promethazine (PHENERGAN) 25 mg/mL injection            ??? trimethoprim-sulfamethoxazole (BACTRIM, SEPTRA) 80-400 mg per tablet 1 Tab  1 Tab Oral ONCE Gordy Levan, MD       ??? promethazine (PHENERGAN) 25 mg/mL injection            ??? 0.9% sodium chloride (MBP/ADV) 0.9 % infusion            ??? HYDROmorphone (DILAUDID) syringe 0.5 mg  0.5 mg IntraVENous Q4H PRN Wanda Plump, MD        ??? promethazine (PHENERGAN) 25 mg/mL injection            ??? trimethoprim-sulfamethoxazole (BACTRIM, SEPTRA) 80-400 mg per tablet 1 Tab  1 Tab Oral DIALYSIS MON, WED & FRI Gordy Levan, MD       ??? 0.9% sodium chloride infusion 250 mL  250 mL IntraVENous PRN Loreli Dollar, MD       ??? dextrose 5 % - 0.2% NaCl infusion  25 mL/hr IntraVENous CONTINUOUS Guss Bunde, MD   Stopped at 10/21/15 1905   ??? lactated ringers infusion  50 mL/hr IntraVENous CONTINUOUS Lorna Dibble Hemelt, CRNA       ??? ondansetron (ZOFRAN) injection 4 mg  4 mg IntraVENous ONCE PRN Lorna Dibble Hemelt, CRNA       ??? hydrALAZINE (APRESOLINE) 20  mg/mL injection 10 mg  10 mg IntraVENous Q6H PRN Woodward Ku, MD       ??? cloNIDine HCl (CATAPRES) tablet 0.2 mg  0.2 mg Oral BID Woodward Ku, MD   0.2 mg at 10/22/15 1610   ??? promethazine (PHENERGAN) 25 mg in 0.9% sodium chloride 50 mL IVPB  25 mg IntraVENous Q6H PRN Theodis Aguas, MD 200 mL/hr at 10/22/15 1442 25 mg at 10/22/15 1442   ??? diphenhydrAMINE (BENADRYL) capsule 25 mg  25 mg Oral Q6H PRN Betsy Pries, MD   25 mg at 10/22/15 9604   ??? ammonium lactate (LAC-HYDRIN) 12 % lotion   Topical BID Betsy Pries, MD       ??? sodium chloride (NS) flush 5-10 mL  5-10 mL IntraVENous PRN Alben Deeds, CRNA       ??? naloxone (NARCAN) injection 0.1 mg  0.1 mg IntraVENous PRN Alben Deeds, CRNA       ??? glucose chewable tablet 16 g  4 Tab Oral PRN Alben Deeds, CRNA       ??? glucagon (GLUCAGEN) injection 1 mg  1 mg IntraMUSCular PRN Alben Deeds, CRNA       ??? dextrose (D50W) injection syrg 12.5-25 g  25-50 mL IntraVENous PRN Alben Deeds, CRNA       ??? sodium citrate 4 gram /100 mL (4 %) 0.08 g  2 mL Does Not Apply DIALYSIS PRN Loreli Dollar, MD       ??? acetaminophen (TYLENOL) tablet 500 mg  500 mg Oral Q6H PRN Betsy Pries, MD       ??? oxyCODONE-acetaminophen (PERCOCET 7.5) 7.5-325 mg per tablet 1 Tab  1 Tab Oral Q6H PRN Edilia Bo, MD   1 Tab at 10/20/15 1117    ??? calcium acetate (PHOSLO) capsule 667 mg  1 Cap Oral TID WITH MEALS Wanda Plump, MD   667 mg at 10/22/15 1241   ??? famotidine (PEPCID) tablet 20 mg  20 mg Oral DAILY Wanda Plump, MD   20 mg at 10/22/15 5409   ??? folic acid (FOLVITE) tablet 1 mg  1 mg Oral DAILY Wanda Plump, MD   1 mg at 10/22/15 8119   ??? hydroxyurea (HYDREA) chemo cap 500 mg  500 mg Oral DAILY Wanda Plump, MD   500 mg at 10/22/15 0839   ??? lisinopril (PRINIVIL, ZESTRIL) tablet 10 mg  10 mg Oral DAILY Wanda Plump, MD   10 mg at 10/22/15 0900   ??? sodium citrate 4 gram /100 mL (4 %) 0.08 g  2 mL Hemodialysis DIALYSIS TUE, THU & SAT Loreli Dollar, MD   Stopped at 10/17/15 1000   ??? ondansetron (ZOFRAN) injection 4 mg  4 mg IntraVENous Q6H PRN Ouida Sills, MD   4 mg at 10/21/15 1478        Physical Exam:     Physical Exam:   General:  Alert, cooperative, no distress, appears stated age.   Lungs:   Clear to auscultation bilaterally.   Heart:  Regular rate and rhythm, S1, S2 normal, no murmur, click, rub or gallop.   Abdomen:   Soft, non-tender. Bowel sounds normal. No masses,  No organomegaly.   Extremities: Extremities normal, atraumatic, no cyanosis or edema,clotted AVG on left forearm, has catheter on (L) IJ, now clampped..   Skin: Skin color, texture, turgor normal. No rashes or lesions         Data Review:    CBC  w/Diff    Recent Labs      10/21/15   0735  10/20/15   1105  10/19/15   1730   WBC  2.5*  2.2*  1.8*   RBC  2.03*  2.61*  2.71*   HGB  6.2*  7.7*  8.1*   HCT  19.7*  25.6*  26.6*   MCV  97.0  98.1*  98.2*   MCH  30.5  29.5  29.9   MCHC  31.5  30.1*  30.5*   RDW  17.4*  17.8*  17.9*    Recent Labs      10/21/15   0735  10/20/15   1105  10/19/15   1730   BANDS   --   6*   --    MONOS  10  14*  12*   EOS  7*  4  6*   BASOS  2  0  0   RDW  17.4*  17.8*  17.9*        Comprehensive Metabolic Profile    Recent Labs      10/21/15   0735  10/20/15   1105  10/19/15   1730   NA  137  138  138   K  5.4  4.8  5.1   CL  104  104  103    CO2  25  25  25    BUN  48*  35*  50*   CREA  6.37*  5.54*  7.26*    Recent Labs      10/21/15   0735  10/20/15   1105  10/19/15   1730   CA  7.8*  8.3*  8.0*   7.7*   PHOS  4.2   --   4.7                      Impression:       Active Hospital Problems    Diagnosis Date Noted   ??? Secondary hyperparathyroidism of renal origin (HCC) 10/18/2015   ??? HIV (human immunodeficiency virus infection) (HCC) 10/17/2015   ??? Renal failure 10/16/2015   ??? ESRD (end stage renal disease) on dialysis (HCC) 07/06/2012   ??? HTN (hypertension) 07/06/2012   ??? Sickle cell anemia with pain (HCC) 07/05/2012      No functional Access for dialysis.      Plan:     Notified Vascular surgeon's office  told him if  He does not have functional dialysis then his Life will be at high risk. Check lab.      Loreli DollarMosta Ola Raap, MD

## 2015-10-22 NOTE — Op Note (Signed)
[facility]                               [address]                          [place], [place] [id number]                                 OPERATIVE REPORT    PATIENT:     Tyler Ballard, Tyler Ballard  MRN:            [id number]   DATE:   10/21/2015  BILLING:     [id number]  ROOM:        [id number]  ATTENDING:   Wanda PlumpIRGHAYU DESAI, MD  DICTATING:   [place] Narjis Mira, DO      PREOPERATIVE DIAGNOSES  1. Renal failure.  2. Bilateral subclavian occlusion.  3. Broken dialysis catheter with bleed, right groin.    POSTOPERATIVE DIAGNOSES  1. Renal failure.  2. Bilateral subclavian occlusion.  3. Broken dialysis catheter with bleed, right groin.    PROCEDURES PERFORMED  1. Ultrasound evaluation bilateral jugular veins.  2. Venogram via left jugular vein access.  3. Central venogram with interpretation.  4. Removal of right femoral tunneled dialysis catheter.    ESTIMATED BLOOD LOSS: 0.    SPECIMENS REMOVED: fem cath    ANESTHESIA:    SURGEON: [place] M. Annalycia Done, DO.    COMPLICATIONS: 0.    CONDITION OF THE PATIENT: Stable.    INDICATIONS FOR PROCEDURE: The patient is a complex 34 year old, came to  the hospital via incarceration service. His history is difficult to obtain.  He tells the renal team that he has not had dialysis for 2 to 3 weeks. He  has a port for infusion on his chest wall that he does not use for  dialysis. He has had since being hospitalized 2 urgently-placed right  femoral catheters, the first placed on Sunday, mysteriously became broken  and hemorrhagic; then a second one placed yesterday, also  mysteriously broke in half overnight and quite hemorrhagic. He has been  transfused today. The catheter had been clamped. He is not actively  bleeding, he has a clotted left arm graft. Today's plan was to establish  some type of dialysis access .    DESCRIPTION OF PROCEDURE: He had an antibiotic, was transferred to the   room, where he had moderate sedation. First, his neck and chest wall were  prepped and draped in the usual standard fashion  CDC compliant  guidelines for aseptic technique. I placed an ultrasound on his right IJ. I did  define a compressible IJ lumen. The carotid was patent and pulsatile. I  accessed venous access with a 19 needle and looked with ultrasound. Wire  progress could not be made; in fact, there is a stent going from  the subclavian to the superior vena cava on the right, so I abandoned this  side. On the left side, there was similarly a dialysis catheter placed into  the left subclavian, a long stent extending from the arm to past  the thoracic outlet into the subclavian and innominate vein. The IJ had no  accessto central the ultrasound showed IJ was dilated and compressible, accessed with  a 19 needle and did a venogram. His IJ collateralizes, the central  communication  is completely occluded. It appears that his prior dialysis  cath have done this subclavian access. He also unfortunately has a  subclavian stent, likely chronic occlusions in nature. This has been his  dialysis source. No doubt I suspect there is some type of  malingering versus poor history unsure with his background, and then also these  broken hospital catheters repeatedly. But nonetheless, the catheter he has  flushes and aspirates very nicely and flushed with concentrated heparin solution,  removed the sutures, placed 2 new sutures and cleaned up the site. He can  use this for dialysis access. A sterile dressing was applied.    I took attention to the right groin. There was indeed a catheter, was  cracked in half  where it bifurcates. I removed these sutures  and removed the catheter and held direct pressure for hemostasis, also  placed a Prolene suture. Going forward for dialysis access options, need  to consider if a Hero catheter could be connected to this. I am unsure if   this can be done. It looks like there is a stent and catheter at the same  site. Certainly has limitations in his access, possibly a thigh access.  Will discuss this with his renal team, certainly right he  is due transfusion and dialysis, which has been lacking, per the patient,  for several weeks.    He was transferred out of the room in stable condition.                     [place] Timera Windt, DO    CM:wmx  D: 10/21/2015 T: 10/22/2015 02:46 A  Job: [id number]  CScriptDoc #: [id number]  cc:   Wanda Plump, MD        [place] Jemiah Cuadra, DO

## 2015-10-22 NOTE — Other (Signed)
Nurse assist pt up to the bathroom using a walker. Tolerate well. Instruct pt to call for assistance when done. Verbalized understanding.

## 2015-10-22 NOTE — Progress Notes (Signed)
CM called to [facility] to schedule an appointment for the patient, spoke to a Diplomatic Services operational officersecretary. She stated, in order to make initial appointment his ID physician will need to fill up a form that will be faxed to this cm shortly, also two recent progress notes form ID MD will be needed. Awaiting needed paperwork. Continue to follow. Robbie Lis McFadden, rn, cm

## 2015-10-22 NOTE — Other (Signed)
IDR/SLIDR Summary          Patient: [name] MRN: [id number]    Age: 34 y.o.     Birthdate: [birth year] Room/Bed: 360/01   Admit Diagnosis: Renal failure  ESRD (end stage renal disease) (HCC)  DX  DX  Principal Diagnosis: <principal problem not specified>     Goals: control pain  Readmission: NO  Quality Measure: Not applicable  VTE Prophylaxis: Chemical  Influenza Vaccine screening completed? YES  Pneumococcal Vaccine screening completed? YES  Mobility needs: Yes   Nutrition plan:Yes  Consults:P.T    Financial concerns:Yes  Escalated to CM? YES  RRAT Score: 7   Interventions:Home Health  Testing due for pt today? YES  LOS: 6 days Expected length of stay 4 days  Discharge plan: home   PCP: Phys Other, MD  Transportation needs: Yes    Days before discharge:two or more days before discharge   Discharge disposition: Home    Signed:     Raynelle FanningSherri L Smalls, RN  10/22/2015  9:48 AM

## 2015-10-22 NOTE — Progress Notes (Signed)
Hourly rounding complete.   Safety  Pt resting   [ x ]  On stretcher with side rails up and bed in locked position, call bell within reach  [  ]  Sitting in chair with casters locked, call bell within reach    Toileting  [ x ] pt denies need to use bathroom  [  ] pt assisted to bathroom  [  ] pt assisted with bedpan  [  ] pt independent to bathroom as needed    Ongoing Plan of Care  Plan of care and expected time for test and results reviewed with pt.    Pain Management / Comfort  [x  ] dimmed lights  [  ] warm blanket provided  [ x ] pain assessed  [  ] monitor alarms reviewed

## 2015-10-22 NOTE — Progress Notes (Signed)
Report received from primary nurse that patient fistula surgery was unsuccessful and TDC was removed leaving no dialysis access. I called Dr Ardelle ParkHaque, and was advised that patient did have dialysis access according to surgeon. I went to talk with patient and was given same report from patient as primary nurse, patient also said that he would receive dialysis on 10/22/15 and not today. Dr Ardelle ParkHaque again informed, v.o. Given to put pt on list for 10/22/15

## 2015-10-23 ENCOUNTER — Inpatient Hospital Stay: Admit: 2015-10-23 | Payer: PRIVATE HEALTH INSURANCE | Primary: Family Medicine

## 2015-10-23 LAB — CBC WITH AUTOMATED DIFF
ABS. BASOPHILS: 0 10*3/uL (ref 0.0–0.1)
ABS. BASOPHILS: 0 10*3/uL (ref 0.0–0.06)
ABS. EOSINOPHILS: 0.1 10*3/uL (ref 0.0–0.4)
ABS. EOSINOPHILS: 0.1 10*3/uL (ref 0.0–0.4)
ABS. LYMPHOCYTES: 0.6 10*3/uL — ABNORMAL LOW (ref 0.9–3.6)
ABS. LYMPHOCYTES: 0.4 10*3/uL — ABNORMAL LOW (ref 0.8–3.5)
ABS. MONOCYTES: 0.2 10*3/uL (ref 0.05–1.2)
ABS. MONOCYTES: 0.1 10*3/uL (ref 0–1.0)
ABS. NEUTROPHILS: 1.1 10*3/uL — ABNORMAL LOW (ref 1.8–8.0)
ABS. NEUTROPHILS: 1 10*3/uL — ABNORMAL LOW (ref 1.8–8.0)
BASOPHILS: 0 % (ref 0–2)
BASOPHILS: 0 % (ref 0–3)
EOSINOPHILS: 6 % — ABNORMAL HIGH (ref 0–5)
EOSINOPHILS: 8 % — ABNORMAL HIGH (ref 0–5)
HCT: 19.9 % — ABNORMAL LOW (ref 36.0–48.0)
HCT: 19.5 % — ABNORMAL LOW (ref 36.0–48.0)
HGB: 6.2 g/dL — ABNORMAL LOW (ref 13.0–16.0)
HGB: 6 g/dL — ABNORMAL LOW (ref 13.0–16.0)
LYMPHOCYTES: 32 % (ref 21–52)
LYMPHOCYTES: 28 % (ref 20–51)
MCH: 29.7 PG (ref 24.0–34.0)
MCH: 29.6 PG (ref 24.0–34.0)
MCHC: 31.2 g/dL (ref 31.0–37.0)
MCHC: 30.8 g/dL — ABNORMAL LOW (ref 31.0–37.0)
MCV: 95.2 FL (ref 74.0–97.0)
MCV: 96.1 FL (ref 74.0–97.0)
MONOCYTES: 9 % (ref 3–10)
MONOCYTES: 4 % (ref 2–9)
MPV: 10.2 FL (ref 9.2–11.8)
MPV: 9.5 FL (ref 9.2–11.8)
NEUTROPHILS: 53 % (ref 40–73)
NEUTROPHILS: 60 % (ref 42–75)
PLATELET COMMENTS: DECREASED
PLATELET COMMENTS: DECREASED
PLATELET: 93 10*3/uL — ABNORMAL LOW (ref 135–420)
PLATELET: 92 10*3/uL — ABNORMAL LOW (ref 135–420)
RBC: 2.09 M/uL — ABNORMAL LOW (ref 4.70–5.50)
RBC: 2.03 M/uL — ABNORMAL LOW (ref 4.70–5.50)
RDW: 16.9 % — ABNORMAL HIGH (ref 11.6–14.5)
RDW: 16.7 % — ABNORMAL HIGH (ref 11.6–14.5)
TOTAL CELLS COUNTED: 50
WBC: 2 10*3/uL — ABNORMAL LOW (ref 4.6–13.2)
WBC: 1.6 10*3/uL — ABNORMAL LOW (ref 4.6–13.2)

## 2015-10-23 LAB — METABOLIC PANEL, BASIC
Anion gap: 11 mmol/L (ref 3.0–18)
Anion gap: 10 mmol/L (ref 3.0–18)
BUN/Creatinine ratio: 9 — ABNORMAL LOW (ref 12–20)
BUN/Creatinine ratio: 9 — ABNORMAL LOW (ref 12–20)
BUN: 67 MG/DL — ABNORMAL HIGH (ref 7.0–18)
BUN: 74 MG/DL — ABNORMAL HIGH (ref 7.0–18)
CO2: 23 mmol/L (ref 21–32)
CO2: 23 mmol/L (ref 21–32)
Calcium: 7.5 MG/DL — ABNORMAL LOW (ref 8.5–10.1)
Calcium: 7.7 MG/DL — ABNORMAL LOW (ref 8.5–10.1)
Chloride: 103 mmol/L (ref 100–108)
Chloride: 103 mmol/L (ref 100–108)
Creatinine: 7.88 MG/DL — ABNORMAL HIGH (ref 0.6–1.3)
Creatinine: 8.56 MG/DL — ABNORMAL HIGH (ref 0.6–1.3)
GFR est AA: 10 mL/min/{1.73_m2} — ABNORMAL LOW (ref >60)
GFR est AA: 9 mL/min/{1.73_m2} — ABNORMAL LOW (ref >60)
GFR est non-AA: 8 mL/min/{1.73_m2} — ABNORMAL LOW (ref >60)
GFR est non-AA: 7 mL/min/{1.73_m2} — ABNORMAL LOW (ref >60)
Glucose: 113 mg/dL — ABNORMAL HIGH (ref 74–99)
Glucose: 98 mg/dL (ref 74–99)
Potassium: 5.7 mmol/L — ABNORMAL HIGH (ref 3.5–5.5)
Potassium: 6.3 mmol/L — CR (ref 3.5–5.5)
Sodium: 137 mmol/L (ref 136–145)
Sodium: 136 mmol/L (ref 136–145)

## 2015-10-23 LAB — PROTHROMBIN TIME + INR
INR: 1.3 — ABNORMAL HIGH (ref 0.8–1.2)
INR: 1.2 (ref 0.8–1.2)
Prothrombin time: 15.8 s — ABNORMAL HIGH (ref 11.5–15.2)
Prothrombin time: 14.8 s (ref 11.5–15.2)

## 2015-10-23 LAB — PHOSPHORUS: Phosphorus: 5.1 MG/DL — ABNORMAL HIGH (ref 2.5–4.9)

## 2015-10-23 LAB — GLUCOSE, POC: Glucose (POC): 133 mg/dL — ABNORMAL HIGH (ref 70–110)

## 2015-10-23 MED ORDER — PROMETHAZINE 25 MG/ML INJECTION
25 mg/mL | INTRAMUSCULAR | Status: AC
Start: 2015-10-23 — End: 2015-10-24
  Administered 2015-10-23: 23:00:00

## 2015-10-23 MED ORDER — HEPARIN (PORCINE) 1,000 UNIT/ML IJ SOLN
1000 unit/mL | INTRAMUSCULAR | Status: AC
Start: 2015-10-23 — End: 2015-10-23
  Administered 2015-10-23: 21:00:00

## 2015-10-23 MED ORDER — SODIUM POLYSTYRENE SULFONATE 15 G/60 ML ORAL SUSP
15 gram/60 mL | ORAL | Status: AC
Start: 2015-10-23 — End: 2015-10-24

## 2015-10-23 MED ORDER — SODIUM CHLORIDE 0.9 % IV PIGGY BACK
INTRAVENOUS | Status: AC
Start: 2015-10-23 — End: 2015-10-22
  Administered 2015-10-23: 02:00:00

## 2015-10-23 MED ORDER — LIDOCAINE-EPINEPHRINE 1 %-1:100,000 IJ SOLN
1 %-:00,000 | INTRAMUSCULAR | Status: AC
Start: 2015-10-23 — End: 2015-10-23
  Administered 2015-10-23: 21:00:00

## 2015-10-23 MED ORDER — SODIUM CHLORIDE 0.9 % IV PIGGY BACK
INTRAVENOUS | Status: AC
Start: 2015-10-23 — End: 2015-10-24

## 2015-10-23 MED ORDER — CLONIDINE 0.1 MG TAB
0.1 mg | ORAL | Status: DC | PRN
Start: 2015-10-23 — End: 2015-11-03
  Administered 2015-11-01 – 2015-11-03 (×3): via ORAL

## 2015-10-23 MED ORDER — SODIUM POLYSTYRENE SULFONATE 15 G/60 ML ORAL SUSP
15 gram/60 mL | ORAL | Status: AC
Start: 2015-10-23 — End: 2015-10-23

## 2015-10-23 MED ORDER — PROMETHAZINE 25 MG/ML INJECTION
25 mg/mL | INTRAMUSCULAR | Status: AC
Start: 2015-10-23 — End: 2015-10-22
  Administered 2015-10-23: 02:00:00

## 2015-10-23 MED ORDER — SODIUM CHLORIDE 0.9 % IV
INTRAVENOUS | Status: DC | PRN
Start: 2015-10-23 — End: 2015-10-29

## 2015-10-23 MED FILL — CALCIUM ACETATE 667 MG CAP: 667 mg | ORAL | Qty: 1

## 2015-10-23 MED FILL — FAMOTIDINE 20 MG TAB: 20 mg | ORAL | Qty: 1

## 2015-10-23 MED FILL — OXYCODONE-ACETAMINOPHEN 7.5 MG-325 MG TAB: ORAL | Qty: 1

## 2015-10-23 MED FILL — TRIMETHOPRIM-SULFAMETHOXAZOLE 80 MG-400 MG TAB: 80-400 mg | ORAL | Qty: 1

## 2015-10-23 MED FILL — HYDROXYUREA 500 MG CAPSULE: 500 mg | ORAL | Qty: 1

## 2015-10-23 MED FILL — HEPARIN (PORCINE) 1,000 UNIT/ML IJ SOLN: 1000 unit/mL | INTRAMUSCULAR | Qty: 10

## 2015-10-23 MED FILL — SODIUM CHLORIDE 0.9 % IV: INTRAVENOUS | Qty: 250

## 2015-10-23 MED FILL — SODIUM CHLORIDE 0.9 % IV PIGGY BACK: INTRAVENOUS | Qty: 50

## 2015-10-23 MED FILL — DIPHENHYDRAMINE 25 MG CAP: 25 mg | ORAL | Qty: 1

## 2015-10-23 MED FILL — FOLIC ACID 1 MG TAB: 1 mg | ORAL | Qty: 1

## 2015-10-23 MED FILL — HYDROMORPHONE 0.5 MG/0.5 ML SYRINGE: 0.5 mg/ mL | INTRAMUSCULAR | Qty: 0.5

## 2015-10-23 MED FILL — LISINOPRIL 10 MG TAB: 10 mg | ORAL | Qty: 1

## 2015-10-23 MED FILL — CLONIDINE 0.1 MG TAB: 0.1 mg | ORAL | Qty: 2

## 2015-10-23 MED FILL — PROMETHAZINE 25 MG/ML INJECTION: 25 mg/mL | INTRAMUSCULAR | Qty: 1

## 2015-10-23 MED FILL — LIDOCAINE-EPINEPHRINE 1 %-1:100,000 IJ SOLN: 1 %-:00,000 | INTRAMUSCULAR | Qty: 20

## 2015-10-23 MED FILL — SODIUM POLYSTYRENE SULFONATE 15 G/60 ML ORAL SUSP: 15 gram/60 mL | ORAL | Qty: 180

## 2015-10-23 NOTE — Consults (Signed)
INTERVENTIONAL RADIOLOGY CONSULT                  Tyler Ballard is a 34 y.o. male who is being seen at the request of Dr Ardelle ParkHaque for ESRD.    Indication/CC:  Patient with malfunctioning dialysis catheter, needs replacement.    Impression:     Patient Active Problem List    Diagnosis Date Noted   ??? Secondary hyperparathyroidism of renal origin (HCC) 10/18/2015   ??? HIV (human immunodeficiency virus infection) (HCC) 10/17/2015   ??? ESRD (end stage renal disease) (HCC) 10/17/2015   ??? Renal failure 10/16/2015   ??? ESRD on dialysis (HCC) 11/25/2013   ??? Clotted dialysis access (HCC) 11/03/2013   ??? Dialysis patient (HCC) 11/03/2013   ??? Secondary hyperparathyroidism (HCC) 11/03/2013   ??? Positive blood culture 11/02/2013   ??? Pulmonary edema 09/06/2013   ??? Drug-seeking behavior 09/06/2013   ??? Noncompliance of patient with renal dialysis (HCC) 09/06/2013   ??? Pancytopenia (HCC) 09/05/2013   ??? Nausea & vomiting 09/04/2013   ??? Malingering 08/06/2013   ??? Do not give narcotics 08/06/2013   ??? ARF (acute renal failure) (HCC) 08/02/2013   ??? Sickle cell crisis (HCC) 05/26/2013     Class: Acute   ??? Arthralgia 03/29/2013   ??? Persistent vomiting 03/29/2013   ??? ESRD (end stage renal disease) on dialysis (HCC) 07/06/2012   ??? HTN (hypertension) 07/06/2012   ??? Sickle cell anemia with pain (HCC) 07/05/2012         Plan:   1.  Dialysis catheter exchange today as schedule allows    NPO:  Not needed    Blood Thinners:  none    Coag/Plt count: INR 1.2 PT 14.8 PLT 92     Cr 8.56  K+ 6.3    History of present illness:  Tyler Ballard is a 34 y.o. Male with multiple comorbidities including history of HTN, HIV, ESRD on HD.  Current TDC to [facility] with a crack in the tubing, now catheter not functional.  IR asked to exchange.  Patient has no complaints at this time.      Imaging:  N/A    Past Medical History:  Past Medical History   Diagnosis Date   ??? AVN (avascular necrosis of bone) (HCC)      right hip   ??? Chronic kidney disease     ??? Chronic kidney disease (CKD), stage V (HCC)    ??? Dialysis patient [facility](HCC)    ??? ESRD on hemodialysis (HCC)    ??? HIV (human immunodeficiency virus infection) (HCC)    ??? HTN (hypertension) 07/06/2012   ??? HTN (hypertension)    ??? Hypertension    ??? Malingering      Sickle Cell testing negative   ??? Malingering      Patient states has had sickle cell disease since age of 89, [facility] for for sick[facility] disease   ??? Noncompliance of patient with renal dialysis (HCC) 09/06/2013   ??? Sickle cell anemia (HCC)      NEGATIVE test. Likely patient states this Dx for secondary gain   ??? Sickle cell disease (HCC)      TOLD BY PATIENT AND NOT TRUE   ??? Stroke (HCC)      2014 pts states he has weakness to right side upper and lower extremities        Medications:   Prior to Admission medications    Medication  Sig Start Date End Date Taking? Authorizing Provider   hydroxyurea (HYDREA) 500 mg capsule Take 500 mg by mouth daily.   Yes Phys Other, MD   folic acid (FOLVITE) 1 mg tablet Take 1 mg by mouth daily.   Yes Phys Other, MD   cloNIDine HCl (CATAPRES) 0.1 mg tablet Take 1 tablet by mouth two (2) times a day. 11/04/13  Yes Vishwas Kathe Mariner, MD   lisinopril (PRINIVIL, ZESTRIL) 10 mg tablet Take 1 tablet by mouth daily. 11/06/13  Yes Vishwas Kathe Mariner, MD   famotidine (PEPCID) 20 mg tablet Take 1 tablet by mouth daily. 11/04/13   Vishwas Kathe Mariner, MD   epoetin alfa (EPOGEN) 10,000 unit/mL injection by SubCUTAneous route once.   Yes Phys Other, MD   calcium acetate (PHOSLO) 667 mg cap Take  by mouth three (3) times daily (with meals).   Yes Phys Other, MD       Allergies:  No Known Allergies    Social History:  Social History     Social History   ??? Marital status: SINGLE     Spouse name: N/A   ??? Number of children: N/A   ??? Years of education: N/A     Occupational History   ??? Not on file.     Social History Main Topics   ??? Smoking status: Former Smoker   ??? Smokeless tobacco: Not on file    ??? Alcohol use No   ??? Drug use: No   ??? Sexual activity: No     Other Topics Concern   ??? Not on file     Social History Narrative    ** Merged History Encounter **         ** Merged History Encounter **         ** Merged History Encounter **         ** Merged History Encounter **         ** Merged History Encounter **            Family History:  Family History   Problem Relation Age of Onset   ??? Hypertension Mother    ??? Cancer Mother    ??? Sickle Cell Anemia Mother    ??? Hypertension Father    ??? Sickle Cell Anemia Father    ??? Sickle Cell Anemia Sister    ??? Sickle Cell Anemia Other        Review of Systems:    General:  No fever  Cardio:  No CP  Pulm:  No SOB  Abd:  No abdominal pain    Data:  Basic Metabolic Profile   Lab Results   Component Value Date    NA 136 10/23/2015    CO2 23 10/23/2015    BUN 74 (H) 10/23/2015        CBC w/Diff   Lab Results   Component Value Date/Time    WBC 1.6 (L) 10/23/2015 11:15 AM    HCT 19.5 (L) 10/23/2015 11:15 AM        Coagulation   Lab Results   Component Value Date    INR 1.2 10/23/2015    APTT 40.2 (H) 10/20/2015          Physical Assessment:  Visit Vitals   ??? BP 188/82 (BP 1 Location: Right arm, BP Patient Position: At rest)   ??? Pulse 87   ??? Temp 97.4 ??F (36.3 ??C)   ??? Resp 16   ??? Ht 5'  7" (1.702 m)   ??? Wt 101.2 kg (223 lb)   ??? SpO2 100%   ??? BMI 34.93 kg/m2       Const:  WDWN, non-toxic appearing  Cardio:  RRR  Pulm:  CTAB, normal respiratory effort  Extr:  Moves all appropriately  Neuro:  AOx3  Skin:  Warm and dry        Phil Dopp, PA-C  For Chanda Busing, MD

## 2015-10-23 NOTE — Progress Notes (Signed)
Pt IV access was lost. Pt hard stick. Requested PO pain medication. Orders given

## 2015-10-23 NOTE — Progress Notes (Signed)
Rapid Response Note  [facility]    Patient: Tyler Ballard Cobos [age] y.o. male  [id number]  1981/05/05      Admit Date: 10/16/2015   Admission Diagnosis: Renal failure  ESRD (end stage renal disease) (HCC)        RAPID RESPONSE     Rapid response called for arterial bleeding from a dialysis catheter. On arrival, multiple staff on scene, ICU RN Anette GuarneriBrenda Godwin on scene, is holding pressure over pt.'s L chest and dialysis catheter. States bleeding was arterial, was spurting. She continued to hold pressure over pt.'s chest while he was taken in bed to the IR lab. Accompanied pt. and staff to IR lab where catheter was clamped and bleeding stopped.    Medications not reviewed. Pt. Was immediately taken to IR lab by ICU RN Anette GuarneriBrenda Godwin.     OBJECTIVE     Visit Vitals   ??? BP 179/78   ??? Pulse (!) 103   ??? Temp 97.4 ??F (36.3 ??Ballard)   ??? Resp 20   ??? Ht 5\' 7"  (1.702 m)   ??? Wt 101.2 kg (223 lb)   ??? SpO2 100%   ??? BMI 34.93 kg/m2       PHYSICAL:  General: Unresponsive until pt. Received in IR lab. He then was responsive to pain and voice, but was lethargic and confused.  Pt. Not evaluated further due to being taken immediately to IR lab.   Resp- Breath sounds clear and equal bilateral.     Medications administered: None.     EKG: None.     Labs: None.     ASSESSMENT, PLAN & DISPOSITION   Tyler Ballard Abee is a 34 y.o. year old male admitted for Renal failure  ESRD (end stage renal disease) (HCC)  - DDx to include Intentional Injury to Self for Secondary personal Gain      Rapid response called for arterial bleeding from a dialysis catheter. Patient condition currently: guarded, left in care of Dr. Larry SierrasSamoilov in IR lab.     Disposition: Left in care of Dr. Larry SierrasSamoilov in IR lab.     Attending Dr. Sherlynn Stallsalur notified of rapid response. In agreement with plan. Primary team resuming care.     Arnetha GulaH. Adam Ninette Cotta, DO   PGY-1 EVMS-PFM  10/23/2015

## 2015-10-23 NOTE — Progress Notes (Signed)
Very difficult situation, discussed with Dr. Ronne BinningMckenzie about fracture / leak [facility]  [facility], he like others strongly suspect that patient is doinig  This . He can not change the catheter, requested to discuss with Interventional Radiologist . Discused with IR &they agreed with changing this (L) IJ catheter today. Once done will dialyze him today  & give 2 units PRBC, if it is too late then will do tomorrow provided that he has functional HD access. Above plan was discussed with the patient & also told about everyone's suspicion that he is doing fracture/leak to catheters, he adamantly refusing that he is doing anything to the catheter.  Will keep NPO until further order.

## 2015-10-23 NOTE — Progress Notes (Signed)
Hourly rounding complete.   Safety  Pt resting   [x  ]  On stretcher with side rails up and bed in locked position, call bell within reach  [  ]  Sitting in chair with casters locked, call bell within reach    Toileting  [ x ] pt denies need to use bathroom  [  ] pt assisted to bathroom  [  ] pt assisted with bedpan  [  ] pt independent to bathroom as needed    Ongoing Plan of Care  Plan of care and expected time for test and results reviewed with pt.    Pain Management / Comfort  [ x ] dimmed lights  [  ] warm blanket provided  [x  ] pain assessed  [  ] monitor alarms reviewed

## 2015-10-23 NOTE — Other (Signed)
ACUTE HEMODIALYSIS FLOW SHEET    HEMODIALYSIS ORDERS: Physician: Jannette Fogo     Dialyzer: Reclear         Duration: 3.5 hr  BFR: 350   DFR: 800   Dialysate:  Temp 37.0 K+   2.0    Ca+  2.5 Na 140 Bicarb 35   Weight:  103.1 kg    Bed Scale      Unable to Obtain       Dry weight/UF Goal: 2500 Access Catheter  Needle Gauge n/a    Heparin   Bolus      Units     Hourly       Units    None     Catheter locking solution n/a   Pre BP:   105/65    Pulse:     86     Temperature:   97.2  Respirations: 18  Tx: NS      ml/Bolus  Other         N/A   Labs: Pre        Post:         N/A   Additional Orders(medications, blood products, hypotension management):        N/A      Time Out/Safety Check   [facility] Consent Verified     CATHETER ACCESS: N/A   Right   Left   IJ     Fem    First use X-ray verified     Tunnel                 Non Tunneled   No S/S infection  Redness  Drainage Cultured Swelling Pain   Medical Aseptic Prep Utilized   Dressing Changed   Biopatch  Date: 10/23/15      Clotted   Patent   Flows: Good  Poor  Reversed   If access problem, Dr. notified: Yes    N/A  Date:           GRAFT/FISTULA ACCESS:  N/A     Right     Left     UE     LE   AVG   AVF        Buttonhole    Medical Aseptic Prep Utilized   No S/S infection  Redness  Drainage Cultured Swelling Pain    Bruit:    Strong     Weak       Thrill :    Strong     Weak       Needle Gauge:   Length:    If access problem, Dr. notified: Yes     N/A  Date:        Please describe access if present and not used:       GENERAL ASSESSMENT:    LUNGS:  Rate Regular  SaO2%       N/A     Clear   Coarse   Crackles   Wheezing         Diminished     Location : RLL   LLL    RUL  LUL   Cough: Productive  Dry  N/A   Respirations:  Easy  Labored   Therapy:  RA  NC  l/min    Mask: NRB Venti       O2%                  Ventilator  Intubated   Trach   BiPaP  CARDIAC: Regular       Irregular    Pericardial Rub   JVD          Monitored   Bedside   Remotely monitored  N/A  Rhythm:     EDEMA:  None  Generalized   Pitting  1     2     3     4                   Facial   Pedal    UE   LE   SKIN:    Warm   Hot      Cold    Dry      Pale    Diaphoretic                   Flushed   Jaundiced   Cyanotic   Rash   Weeping   LOC:     Alert      Oriented:     Person      Place  Time                Confused   Lethargic   Medicated   Non-responsive     GI / ABDOMEN:   Flat     Distended     Soft     Firm     Obese                              Diarrhea   Bowel Sounds   Nausea   Vomiting      GU / URINE ASSESSMENT:  Voiding    Oliguria   Anuria     Foley      Incontinent      Incontinent Brief        Bathroom Privileges     PAIN:  0 1  2   3   4   5   6   7   8   9   10             Scale 0-10  Action/Follow Up: 0   MOBILITY:   Amb     Amb/Assist     Bed     Wheelchair   Stretcher      All Vitals and Treatment Details on Attached Flowsheet       Hospital: [facility]          Room # 360      1st Time Acute   Stat   Routine   Urgent      Acute Room    Bedside   ICU/CCU   ER   Isolation Precautions:   Dialysis    Airborne    Contact     Reverse   Special Considerations:          Blood Consent Verified N/A     ALLERGIES:    NKA          Code Status:   Full Code   DNR   Other           HBsAg ONLY: Date Drawn     10/17/15     Negative Positive Unknown   HBsAb: Date 10/17/15     Susceptible    Immune10 Not Drawn   Drawn     Current Labs:    Date of Labs:         Today  Results for Tyler, Ballard (MRN [id number]) as of 10/23/2015 21:02   Ref. Range 10/23/2015 11:15 10/23/2015 11:15 10/23/2015 15:57   GLUCOSE,FAST - POC Latest Ref Range: 70 - 110 mg/dL   133 (H)   WBC Latest Ref Range: 4.6 - 13.2 K/uL 1.6 (L)     RBC Latest Ref Range: 4.70 - 5.50 M/uL 2.03 (L)     HGB Latest Ref Range: 13.0 - 16.0 g/dL 6.0 (L)     HCT Latest Ref Range: 36.0 - 48.0 % 19.5 (L)     MCV Latest Ref Range: 74.0 - 97.0 FL 96.1     MCH Latest Ref Range: 24.0 - 34.0 PG 29.6     MCHC Latest Ref Range: 31.0 - 37.0 g/dL 30.8 (L)      RDW Latest Ref Range: 11.6 - 14.5 % 16.7 (H)     PLATELET Latest Ref Range: 135 - 420 K/uL 92 (L)     MPV Latest Ref Range: 9.2 - 11.8 FL 9.5     NEUTROPHILS Latest Ref Range: 42 - 75 % 60     LYMPHOCYTES Latest Ref Range: 20 - 51 % 28     MONOCYTES Latest Ref Range: 2 - 9 % 4     EOSINOPHILS Latest Ref Range: 0 - 5 % 8 (H)     BASOPHILS Latest Ref Range: 0 - 3 % 0     DF Latest Units:   MANUAL     ABS. NEUTROPHILS Latest Ref Range: 1.8 - 8.0 K/UL 1.0 (L)     ABS. LYMPHOCYTES Latest Ref Range: 0.8 - 3.5 K/UL 0.4 (L)     ABS. MONOCYTES Latest Ref Range: 0 - 1.0 K/UL 0.1     ABS. EOSINOPHILS Latest Ref Range: 0.0 - 0.4 K/UL 0.1     ABS. BASOPHILS Latest Ref Range: 0.0 - 0.06 K/UL 0.0     RBC COMMENTS Latest Units:   POLYCHROMASIA... ANISOCYTOSIS...    PLATELET COMMENTS Latest Units:   DECREASED PLATELETS     INR Latest Ref Range: 0.8 - 1.2   1.2     Prothrombin time Latest Ref Range: 11.5 - 15.2 sec 14.8     Sodium Latest Ref Range: 136 - 145 mmol/L 136     Potassium Latest Ref Range: 3.5 - 5.5 mmol/L 6.3 (HH)     Chloride Latest Ref Range: 100 - 108 mmol/L 103     CO2 Latest Ref Range: 21 - 32 mmol/L 23     Anion gap Latest Ref Range: 3.0 - 18 mmol/L 10     Glucose Latest Ref Range: 74 - 99 mg/dL 98     BUN Latest Ref Range: 7.0 - 18 MG/DL 74 (H)     Creatinine Latest Ref Range: 0.6 - 1.3 MG/DL 8.56 (H)     BUN/Creatinine ratio Latest Ref Range: 12 - 20   9 (L)     Calcium Latest Ref Range: 8.5 - 10.1 MG/DL 7.7 (L)     GFR est non-AA Latest Ref Range: >60 ml/min/1.33m2 7 (L)     GFR est AA Latest Ref Range: >60 ml/min/1.18m2 9 (L)     TOTAL CELLS COUNTED Latest Units:   50     and paste current labs here.  DIET:   Renal     Other      NPO       Diabetic      PRIMARY NURSE REPORT: First initial/Last name/Title      Pre Dialysis: Marchelle Folks    Time: 2030      EDUCATION:      Patient  Other         Knowledge Basis: None Minimal  Substantial   Barriers to learning  N/A    Access Care      S&S of infection      Fluid Management     K+     Procedural    Albumin      Medications      Tx Options      Transplant      Diet      Other   Teaching Tools:   Explain   Demo   Handouts  Video  Patient response:   Verbalized understanding   Teach back   Return demonstration  Requires follow up   Inappropriate due to            [place] ??? Before each treatment:     Machine Number:                   [facility] [address]                                   Unit Machine # 5 with centralized RO                                   Portable Machine #1/RO serial # [id number]                                   Portable Machine #2/RO serial # [id number]                                   Portable Machine #3/RO serial # [id number]                                                                                                       [place] #11/RO serial # [id number]                                    Portable Machine #12/RO serial # [id number]  Portable Machine #13/RO serial #  [id number]      Alarm Test:  Pass time 2051         Other:          RO/Machine Log Complete      Temp    37.0           Extracorporeal Circuit Tested for integrity   Dialysate: pH  7.2 Conductivity: Meter   14.0     HD Machine   14.2                  TCD: 14.0  Dialyzer Lot # [id number]            Blood Tubing Lot # [id number]          Saline Lot #  [id number]     CHLORINE TESTING-Before each treatment and every 4 hours    Total Chlorine:  less than 0.1 ppm  Time: 2051 4 Hr/2nd Check Time: 0051 Neg.   (if greater than 0.1 ppm from Primary then every 30 minutes from Secondary)     TREATMENT INITIATION ??? with Dialysis Precautions:    All Connections Secured                  Saline Line Double Clamped     Venous Parameters Set                   Arterial Parameters Set     Prime Given  250 ml               Air Foam Detector Engaged      Treatment Initiation Note: Hemodialysis tx initiated successfully. No signs and symptoms of acute distress noted at this time. UF goal set at 2500 mL as tolerated. Tx scheduled time 3.5 hours. Accessed left chest catheter utilizing medically aseptic technique. Both lines patent with good blood flow. Will monitor patient closely throughout tx. Pre-tx report received from Marchelle Folks, RN.     Medication Dose Volume Route Initials Dialyzer Cleared:  Good  Fair   Poor    Blood processed:  70.1 L  UF Removed  2500 Ml    Post Wt: 100.3    kg  POst BP:   170/82       Pulse: 84      Respirations: 20  Temperature: 99.4       Benadryl       12.5 mg X 2    0.25 ml      IV      CH  Post Tx Vascular Access: AVF/AVG: Bleeding stopped Art n/a  min. Ignacia Bayley   N/A                                   Catheter: Locking solution: Heparin 1:1000 Art. 2.1  Ven. 2.1  N/A                                 Post Assessment:                                    Skin:   Warm   Dry  Diaphoretic     Flushed   Pale  Cyanotic   [facility] Signatures Title Initials  Time  Lungs:  Clear     Course   Crackles   Wheezing  Diminished   Shon Millet RN [facility] 0130 Cardiac:  Regular    Irregular    Monitor   N/A  Rhythm:           Edema:   None     General      Facial    Pedal     UE     LE       Pain: 0  $'1  2   3  4   5   6   7   8   9   10         'e$ Post Treatment Note: Patient tolerated HD treatment well.  Two units of blood was administered and no reactions was noted. The patient was complaining of itching. The nephrologist was notified and benadryl was ordered times two of 12.5 mg IV. UF goal met able to successfully remove 2.5 L's of fluid. Rinsed back blood. Flushed both lines with NS. Instilled prescribed sodium citrate into each lumen. Both lines clamped and capped.  Pt and VS stable at this time. Report given to Marchelle Folks, RN      POST TREATMENT PRIMARY NURSE HANDOFF REPORT:     First initial/Last name/Title         Post Dialysis: Marchelle Folks Time:  0115     Abbreviations: AVG-arterial venous graft, AVF-arterial venous fistula, IJ-Internal Jugular, Subcl-Subclavian, Fem-Femoral, Tx-treatment, AP/HR-apical heart rate, DFR-dialysate flow rate, BFR-blood flow rate, AP-arterial pressure, VP-venous pressure, UF-ultrafiltrate, TMP-transmembrane pressure, Ven-Venous, Art-Arterial, RO-Reverse Osmosis

## 2015-10-23 NOTE — Progress Notes (Signed)
Bilateral upper extremity vein mapping duplex exam complete; preliminary findings to follow.

## 2015-10-23 NOTE — Progress Notes (Signed)
Now has TDC in (L) IJ, will dialyze tonight & give 2 units of PRBC, he refused to take Kayexalate.

## 2015-10-23 NOTE — Procedures (Signed)
RADIOLOGY POST PROCEDURE NOTE     October 23, 2015       5:14 PM     Preoperative Diagnosis:   Fractured TDC    Postoperative Diagnosis:  Same.    Operator:  Dr. Larry SierrasSamoilov    Assistant:  None.    Type of Anesthesia: 1% plain lidocaine    Procedure/Description:  Left subclavian TDC exchange.    Findings:   See report.    Estimated blood Loss:  Minimal    Specimen Removed:   no    Blood transfusions:  None.    Implants:  14.29F Palindrome 23 cm cuff to tip TDC.    Complications: None    Condition: Stable    Discharge Plan:  continue present therapy     Catheter is OK to use    Chanda Busingmitri Layman Gully, MD

## 2015-10-23 NOTE — Other (Signed)
??   Patient Demographics  ??   ?? Patient Name HAR Sex DOB Address Phone ??   ?? [name], Navdeep C [phone] Male [birth year] [address]  [place] [id number] [phone] [facility](Home)  [phone] (Mobile) ??   ??   ?? CSN: ??   ?? [id number] ??   ??   ?? Admit Date: Admit Time Room Bed ??   ?? Oct 16, 2015 ??7:38 PM 360 [[id number]][[id number]] 01 [[id number]] ??   ??   ?? Attending Providers  ??   ?? Provider Pager From To ??   ?? Thomes DinningVictor T Anglin, MD  10/16/15 10/17/15 ??   ?? Wanda Plumpirghayu Desai, MD  10/17/15         Addendum Concurrent by Sallyanne KusterMichele A Mangiaratti  ??   ?? Review Status Review Entered ??   ?? In Primary 10/23/2015 ??   ?? Details ??   ?? ?? Labs requested by Herbert SetaHeather ??D[facility] ;  ??  ??  Component Results ??   ??  ?? Component?? Value?? Flag?? Ref Range?? Units?? Status??   ?? Abs CD4 Helper?? 125?? (L)?? 359 - 1519?? /uL?? Final??   ?? % CD 4 Pos Lymph?? 17.8?? (L)?? 30.8 - 58.5?? %?? Final??   ?? Comment:??   ?? (NOTE)   Performed At: [facility]   [address] [place], [place] [id number]   Mila HomerHancock William F MD KV:[phone]:[phone]   ??   ?? WBC?? 2.3?? (L)?? 3.4 - 10.8?? x10E3/uL?? Final??   ?? RBC?? 2.84?? (L)?? 4.14 - 5.80?? x10E6/uL?? Final??   ?? HGB?? 8.6?? (L)?? 12.6 - 17.7?? g/dL?? Final??   ?? HCT?? 27.7?? (L)?? 37.5 - 51.0?? %?? Final??   ?? MCV?? 98?? (H)?? 79 - 97?? fL?? Final??   ?? MCH?? 30.3?? ?? 26.6 - 33.0?? pg?? Final??   ?? MCHC?? 31.0?? (L)?? 31.5 - 35.7?? g/dL?? Final??   ?? RDW?? 17.8?? (H)?? 12.3 - 15.4?? %?? Final??   ?? PLATELET?? 108?? (L)?? 150 - 379?? x10E3/uL?? Final??   ?? NEUTROPHILS?? 54?? ?? ?? %?? Final??   ?? Lymphocytes?? 29?? ?? ?? %?? Final??   ?? MONOCYTES?? 11?? ?? ?? %?? Final??   ?? EOSINOPHILS?? 6?? ?? ?? %?? Final??   ?? BASOPHILS?? 0?? ?? ?? %?? Final??   ?? ABS. NEUTROPHILS?? 1.2?? (L)?? 1.4 - 7.0?? x10E3/uL?? Final??   ?? Abs Lymphocytes?? 0.7?? ?? 0.7 - 3.1?? x10E3/uL?? Final??   ?? ABS. MONOCYTES?? 0.3?? ?? 0.1 - 0.9?? x10E3/uL?? Final??   ?? ABS. EOSINOPHILS?? 0.2?? ?? 0.0 - 0.4?? x10E3/uL?? Final??   ?? ABS. BASOPHILS?? 0.0?? ?? 0.0 - 0.2?? x10E3/uL?? Final??   ?? IMMATURE GRANULOCYTES?? 0?? ?? ?? %?? Final??    ?? ABS. IMM. GRANS.?? 0.0?? ?? 0.0 - 0.1?? X10E3/uL?? Final??   ?? NRBC?? 0?? ?? 0 - 0?? %?? Final??   ?? Comment:??   ?? (NOTE)   Performed At: [facility]   [address] [place], [place] [id number]   Mila HomerHancock William F MD AC:[phone]:[phone]

## 2015-10-23 NOTE — Procedures (Signed)
[facility]  *** FINAL REPORT ***    Name: Tyler Ballard, Tyler Ballard  MRN: [id number]    Inpatient  DOB: August 14, 1981  HIS Order #: [id number]  TRAKnet Visit #: [id number]  Date: 23 Oct 2015    TYPE OF TEST: Peripheral Venous Testing    REASON FOR TEST  For dialysis access    Right Arm:-  Deep venous thrombosis:           No  Superficial venous thrombosis:    No    Left Arm:-  Deep venous thrombosis:           Yes  Proximal extent of thrombus:      Internal Jugular  Superficial venous thrombosis:    No    Vein Mapping:    Diam.   Depth  (mm)    Right Cephalic Vein:    Axilla:          2.6    Upper Arm:       2.6    Lower Arm:       1.0    Antecubital:    Upper Forearm:   1.6    Forearm:         0.8    Wrist:           0.8    Right Basilic Vein:    Upper Arm:    Lower Arm:    Antecubital:    Upper Forearm:   1.8    Lower Forearm:   1.0    Wrist:           0.6    R.Median Cubital:    Right Brachial Vein:    Proximal:    Distal:    Right Subclavian Artery:  Right Axillary Artery  :    Left Cephalic Vein:    Axilla:    Upper Arm:    Lower Arm:       1.7    Antecubital:    Upper Forearm:   1.8    Forearm:         1.3    Wrist:    Left Basilic Vein:    Upper Arm:    Lower Arm:       2.3    Antecubital:    Upper Forearm:   1.2    Lower Forearm:    Wrist:    L.Median Cubital:    Left Brachial Vein:    Proximal:    Distal:    Left Subclavian Artery:  Left Axillary Artery  :      INTERPRETATION/FINDINGS  Duplex images were obtained using 2-D gray scale, color flow, and  spectral Doppler analysis.    Right arm :  1. The cephalic vein may be an inadequate conduit for dialysis access  - the smallest diameter is 0.8 mm.  2. The basilic vein may be an inadequate conduit for dialysis access -   the smallest diameter is 0.6 mm. Note: Basilic vein non-visualized in   the upper arm segment.  3. No evidence of deep venous thrombosis detected in the veins  visualized.  4. Deep vein(s) visualized include the internal jugular,  subclavian,  axillary and brachial veins.  5. Low resistant doppler signal of the brachial artery. Active  dialysis access noted.  Left arm :  1. The cephalic vein may be an inadequate conduit for dialysis access  - the smallest diameter is 1.3 mm. Note: Only random - partial  segments of this  vein could be visualized.  2. The basilic vein may be an inadequate conduit for dialysis access -   the smallest diameter is 1.2 mm. Note: Only random - partial segments   of this vein could be visualized.  3. Indeterminate, nonocclusive deep venous thrombosis identified in  the internal jugular vein.  4. Deep vein(s) visualized include the internal jugular, subclavian,  axillary and brachial veins.  5. Biphasic brachial artery doppler signal.    ADDITIONAL COMMENTS  Results to office nurse, Cathey EndowKathy Grizzle @ 10:53 a.m.    I have personally reviewed the data relevant to the interpretation of  this  study.    TECHNOLOGIST: Ned Clinesharlie M. Baker RVT  Signed: 10/23/2015 11:12 AM    PHYSICIAN: [place] DionesJun Akram Kissick MD  Signed: 10/23/2015 12:51 PM

## 2015-10-23 NOTE — Procedures (Signed)
Procedures  by Chanda BusingSamoilov, Lincoln Ginley, MD at 10/23/15 1714                Author: Chanda BusingSamoilov, Eros Montour, MD  Service: RADIOLOGY  Author Type: Physician       Filed: 10/23/15 1716  Date of Service: 10/23/15 1714  Status: Signed          Editor: Chanda BusingSamoilov, Antoinett Dorman, MD (Physician)                         RADIOLOGY POST PROCEDURE NOTE          October 23, 2015       5:14 PM        Preoperative Diagnosis:   Fractured TDC      Postoperative Diagnosis:  Same.      Operator:  Dr. Larry SierrasSamoilov      Assistant:  None.      Type of Anesthesia: 1% plain lidocaine      Procedure/Description:  Left subclavian TDC exchange.      Findings:   See report.      Estimated blood Loss:  Minimal      Specimen Removed:   no      Blood transfusions:  None.      Implants:  14.40F Palindrome 23 cm cuff to tip TDC.      Complications: None      Condition: Stable      Discharge Plan:  continue present therapy        Catheter is OK to use      Chanda Busingmitri Adaeze Better, MD

## 2015-10-23 NOTE — Progress Notes (Addendum)
Entered pnt's room to advise him of pending transport to IR for dialysis access placement. Upon seeing pnt, this RN observed L side of pnt's chest and side covered in blood. Attempted to arouse pnt verbally and by tactile stimulation; pnt somewhat arousable. It was also observed that both lines in dialysis cath appeared like were split at the top, blood spurting out. Pressure applied to site; RR called. VS assessed and WNL. Pnt taken down to IR stat by RR team. RR even and nonlabored. Dr. Sherlynn Stallsalur aware.

## 2015-10-23 NOTE — Progress Notes (Signed)
Chaplain attended the interdisciplinary rounds for [name], who is a 33 y.o.,male. Patient???s Primary Language is: [place].   According to the patient???s EMR Religious Affiliation is: No religion.     The reason the Patient came to the hospital is:   Patient Active Problem List    Diagnosis Date Noted   ??? Secondary hyperparathyroidism of renal origin (HCC) 10/18/2015   ??? HIV (human immunodeficiency virus infection) (HCC) 10/17/2015   ??? ESRD (end stage renal disease) (HCC) 10/17/2015   ??? Renal failure 10/16/2015   ??? ESRD on dialysis (HCC) 11/25/2013   ??? Clotted dialysis access (HCC) 11/03/2013   ??? Dialysis patient (HCC) 11/03/2013   ??? Secondary hyperparathyroidism (HCC) 11/03/2013   ??? Positive blood culture 11/02/2013   ??? Pulmonary edema 09/06/2013   ??? Drug-seeking behavior 09/06/2013   ??? Noncompliance of patient with renal dialysis (HCC) 09/06/2013   ??? Pancytopenia (HCC) 09/05/2013   ??? Nausea & vomiting 09/04/2013   ??? Malingering 08/06/2013   ??? Do not give narcotics 08/06/2013   ??? ARF (acute renal failure) (HCC) 08/02/2013   ??? Sickle cell crisis (HCC) 05/26/2013     Class: Acute   ??? Arthralgia 03/29/2013   ??? Persistent vomiting 03/29/2013   ??? ESRD (end stage renal disease) on dialysis (HCC) 07/06/2012   ??? HTN (hypertension) 07/06/2012   ??? Sickle cell anemia with pain (HCC) 07/05/2012        Plan:  Chaplains will continue to follow and will provide pastoral care on an as needed/requested basis.      Chaplain Eilene GhaziKerry Pokines   Board Certified Chaplain   Spiritual Care   [phone]

## 2015-10-23 NOTE — Procedures (Signed)
[facility]  *** FINAL REPORT ***    Name: Tyler Ballard, Tyler Ballard  MRN: [id number]    Inpatient  DOB: May 03, 1981  HIS Order #: [id number]  TRAKnet Visit #: [id number]  Date: 23 Oct 2015    TYPE OF TEST: Peripheral Venous Testing    REASON FOR TEST  For dialysis access    Right Arm:-  Deep venous thrombosis:           No  Superficial venous thrombosis:    No    Left Arm:-  Deep venous thrombosis:           Yes  Proximal extent of thrombus:      Internal Jugular  Superficial venous thrombosis:    No    Vein Mapping:    Diam.   Depth  (mm)    Right Cephalic Vein:    Axilla:          2.6    Upper Arm:       2.6    Lower Arm:       1.0    Antecubital:    Upper Forearm:   1.6    Forearm:         0.8    Wrist:           0.8    Right Basilic Vein:    Upper Arm:    Lower Arm:    Antecubital:    Upper Forearm:   1.8    Lower Forearm:   1.0    Wrist:           0.6    R.Median Cubital:    Right Brachial Vein:    Proximal:    Distal:    Right Subclavian Artery:  Right Axillary Artery  :    Left Cephalic Vein:    Axilla:    Upper Arm:    Lower Arm:       1.7    Antecubital:    Upper Forearm:   1.8    Forearm:         1.3    Wrist:    Left Basilic Vein:    Upper Arm:    Lower Arm:       2.3    Antecubital:    Upper Forearm:   1.2    Lower Forearm:    Wrist:    L.Median Cubital:    Left Brachial Vein:    Proximal:    Distal:    Left Subclavian Artery:  Left Axillary Artery  :      INTERPRETATION/FINDINGS  Duplex images were obtained using 2-D gray scale, color flow, and  spectral Doppler analysis.    Right arm :  1. The cephalic vein may be an inadequate conduit for dialysis access  - the smallest diameter is 0.8 mm.  2. The basilic vein may be an inadequate conduit for dialysis access -   the smallest diameter is 0.6 mm. Note: Basilic vein non-visualized in   the upper arm segment.  3. No evidence of deep venous thrombosis detected in the veins  visualized.   4. Deep vein(s) visualized include the internal jugular, subclavian,  axillary and brachial veins.  5. Low resistant doppler signal of the brachial artery. Active  dialysis access noted.  Left arm :  1. The cephalic vein may be an inadequate conduit for dialysis access  - the smallest diameter is 1.3 mm. Note: Only random - partial  segments of this  vein could be visualized.  2. The basilic vein may be an inadequate conduit for dialysis access -   the smallest diameter is 1.2 mm. Note: Only random - partial segments   of this vein could be visualized.  3. Indeterminate, nonocclusive deep venous thrombosis identified in  the internal jugular vein.  4. Deep vein(s) visualized include the internal jugular, subclavian,  axillary and brachial veins.  5. Biphasic brachial artery doppler signal.    ADDITIONAL COMMENTS  Results to office nurse, Cathey EndowKathy Grizzle @ 10:53 a.m.    I have personally reviewed the data relevant to the interpretation of  this  study.    TECHNOLOGIST: Ned Clinesharlie M. Baker RVT  Signed: 10/23/2015 11:12 AM    PHYSICIAN: [place] DionesJun Jovoni Borkenhagen MD  Signed: 10/23/2015 12:51 PM

## 2015-10-23 NOTE — Progress Notes (Signed)
Hospitalist Progress Note    Patient: Tyler Ballard MRN: [id number]  CSN: [id number]    Date of Birth: 16-Sep-1981  Age: 34 y.o.  Sex: male    DOA: 10/16/2015 LOS:  LOS: 7 days          Pt AAOx3, declines any dizziness or SOB. Just back from IR suite after getting new HD cath.   Pt had RRT again today due to bleeding from HD cath site, for some unclear reason his hemostat placed yesterday on cath was off and pt was bleeding.   RN and staff feel that pt breaking cath and making himself bleeding, way HD cath are cut and ripped i also feel that pt been manipulating caths. On asking pt gets very anger that we are accusing him.   Pt Refused kayexalate this afternoon but no one notified me, he agrees to take it now but pt is scheduled for HD  i have d/w IR and nephrology they feel that pt is manipulating catheters.   i have d/w guard that pt needs to observed 24/7, if he touches catheter then they need to notify RN or staff immediatly if they cant watch him then we will place sitter. Guard agreed to watch pt very closely     Assessment/Plan     1. Non-functioning HD access - s/p HD catheter X 2 placement by vascular and both had break and cant use them, he had 3 bleeding episodes and RRT's. vas couldn't open up clotted graft. s/p new cath by guide wire.    2. ESRD / HD per nephrology.   3. ? Sickle cell anemia: had neg Hb electrophoresis in 2014.   4. HTN: BP higher side, but should improve with HD. will cont ACE, catapres and norvasc  5. HIV/AIDS: asymptomatic, no infections: ID in put noted. Hold bactrim due to # 9.  6. Obesity Body mass index is 34.93 kg/(m^2).  7. pancytopenia in the setting of HIV, no need for oncology eval   8. Acute blood loss Anemia: will 2 units with HD today  9. Hyperkalemia: going for HD, d/w nephrology he will get 2 units with HD tonight  Pt doesn't have IV access, IR couldn't do peripheral or central line due to all veins are thrombosed, IR recommends intrahepatic line on Monday  under GA if needs long trem IV. For now recommends to use HD cath.    D/w renal team in detail     I spent 30 minutes with the patient in face-to-face consultation, of which greater than 50% was spent in counseling and coordination of care as described above       Case discussed with:  Patient  Family  Nursing  Case Management  DVT Prophylaxis:  Lovenox  Hep SQ  SCDs  Coumadin   On Heparin gtt    Vital signs/Intake and Output:  Visit Vitals   ??? BP 168/86 (BP 1 Location: Right arm, BP Patient Position: At rest)   ??? Pulse 96   ??? Temp 98.6 ??F (37 ??C)   ??? Resp 16   ??? Ht  (1.702 m)   ??? Wt 101.2 kg (223 lb)   ??? SpO2 98%   ??? BMI 34.93 kg/m2       Awake alert and oriented.  Ncat. Perrl. Poor dentition  R groin TDC removed.  RRR  cta b.l  Soft nt nd nabs.  AVG.   No focal deficit  No rash  L chest HD cath site  dressing clean, cath inside dressing.     Medications Reviewed      Labs: Results:       Chemistry Recent Labs      10/23/15   1115  10/22/15   2000  10/21/15   0735   GLU  98  113*  82   NA  136  137  137   K  6.3*  5.7*  5.4   CL  103  103  104   CO2  23  23  25    BUN  74*  67*  48*   CREA  8.56*  7.88*  6.37*   CA  7.7*  7.5*  7.8*   AGAP  10  11  8    BUCR  9*  9*  8*      CBC w/Diff Recent Labs      10/23/15   1115  10/22/15   2000  10/21/15   0735   WBC  1.6*  2.0*  2.5*   RBC  2.03*  2.09*  2.03*   HGB  6.0*  6.2*  6.2*   HCT  19.5*  19.9*  19.7*   PLT  92*  93*  93*   GRANS  60  53  54   LYMPH  28  32  27   EOS  8*  6*  7*      Cardiac Enzymes No results for input(s): CPK, CKND1, MYO in the last 72 hours.    No lab exists for component: CKRMB, TROIP   Coagulation Recent Labs      10/23/15   1115  10/22/15   2000   PTP  14.8  15.8*   INR  1.2  1.3*       Lipid Panel Lab Results   Component Value Date/Time    CHOLESTEROL, TOTAL 85 11/29/2012 09:36 AM    HDL CHOLESTEROL 33 11/29/2012 09:36 AM    LDL, CALCULATED 41.2 11/29/2012 09:36 AM    VLDL, CALCULATED 10.8 11/29/2012 09:36 AM     TRIGLYCERIDE 54 11/29/2012 09:36 AM    CHOL/HDL RATIO 2.6 11/29/2012 09:36 AM      BNP No results for input(s): BNPP in the last 72 hours.   Liver Enzymes No results for input(s): TP, ALB, TBIL, AP, SGOT, GPT in the last 72 hours.    No lab exists for component: DBIL   Thyroid Studies No results found for: T4, T3U, TSH, TSHEXT, TSHEXT     Procedures/imaging: see electronic medical records for all procedures/Xrays and details which were not copied into this note but were reviewed prior to creation of Plan.

## 2015-10-23 NOTE — Progress Notes (Signed)
Pnt down to vascular for doppler study @ this time. Pnt accompanied by security, Public relations account executivecorrectional officer and transport. Pnt stable. NADN

## 2015-10-23 NOTE — Other (Signed)
Psychiatric consult called in by Dr. Sherlynn Stallsalur. Will be assigned to Dr. Fran LowesLai.

## 2015-10-23 NOTE — Progress Notes (Signed)
Chaplain responded to Rapid Respond  for  [name], who is a 33 y.o.,male,     The Chaplain provided the following Interventions:  Provided Pastoral presence.  Offered prayer on behalf for the patient.   Patient taken for an emergent procedure.  Patient is a prisoner, guard was present.  Chart reviewed.      Plan:  Chaplains will continue to follow and will provide spiritual care as needed.  Chaplain recommends bedside caregivers page chaplain on duty if patient or family shows signs of acute spiritual or emotional distress.       Rev. Baltazar ApoFrank Ferguson    Chaplain  Spiritual Care  [phone]- [place]

## 2015-10-23 NOTE — Other (Signed)
During IDR rounding, pt expressed conern about not having dialysis since Monday. Pt requested to know if he is scheduled for dialysis catheter placement today. Call placed to doctor Ronne BinningMckenzie, states, "I will talk to doctor Ardelle ParkHaque about it". Pt made aware.

## 2015-10-23 NOTE — Progress Notes (Signed)
NUTRITION    BPA/MST Referral       RECOMMENDATIONS / PLAN:     - Continue current nutrition interventions. Monitor and encourage po intake.      NUTRITION INTERVENTIONS:      Meals/Snacks: Modified diet, monitor po intake    Medical food supplementation: Nepro TID    Vitamin and mineral supplementation: folic acid   Nutrition-related medication management: phoslo, phenergan    Coordination of nutrition care   Continue inpatient monitoring and intervention     ASSESSMENT:     Subjective/Objective: ESRD on hemodialysis.  Decreased appetite and poor po intake of recent meals. Encouraged po intake.     Diet: DIET NUTRITIONAL SUPPLEMENTS  DIET CARDIAC Regular    Food Allergies: NKFA  Chewing/Swallowing Difficulty:   None known      Yes:  Meal Intake:   Patient Vitals for the past 100 hrs:   % Diet Eaten   10/23/15 0919 50 %   10/21/15 1800 0 %   10/21/15 1304 0 %   10/21/15 0947 0 %   10/20/15 1906 100 %   10/19/15 1344 100 %   10/19/15 0906 100 %     BM:  10/29  Skin Integrity: catheter site, leg surgical incision     Edema: none     Pertinent Medications: Reviewed     Labs:  Recent Labs      10/22/15   2000  10/21/15   0735   NA  137  137   K  5.7*  5.4   CL  103  104   CO2  23  25   GLU  113*  82   BUN  67*  48*   CREA  7.88*  6.37*   CA  7.5*  7.8*   PHOS  5.1*  4.2         Intake/Output Summary (Last 24 hours) at 10/23/15 1118  Last data filed at 10/23/15 0919   Gross per 24 hour   Intake    370 ml   Output      0 ml   Net    370 ml       Anthropometrics:  Ht Readings from Last 1 Encounters:   10/17/15 $RemoveB'5\' 7"'TrUXRjmc$  (1.702 m)     Last 3 Recorded Weights in this Encounter    10/21/15 0430 10/22/15 0701 10/23/15 0400   Weight: 97.5 kg (215 lb) 99.9 kg (220 lb 3.2 oz) 101.2 kg (223 lb)     Body mass index is 34.93 kg/(m^2).  Obese, Class I     Weight History: 23 lb, 9% weight loss x month per chart     Weight Metrics 10/23/2015 03/28/2014 11/01/2013 09/29/2013 09/06/2013 09/05/2013 08/06/2013    Weight 223 lb 170 lb 180 lb 180 lb 180 lb 243 lb 222 lb   BMI 34.93 kg/m2 28.29 kg/m2 29.95 kg/m2 29.95 kg/m2 29.95 kg/m2 40.44 kg/m2 -        Admitting Diagnosis: Renal failure  ESRD (end stage renal disease) (HCC)  DX  DX  Past Medical History   Diagnosis Date   ??? AVN (avascular necrosis of bone) (HCC)      right hip   ??? Chronic kidney disease    ??? Chronic kidney disease (CKD), stage V ([place])    ??? Dialysis patient [facility])    ??? ESRD on hemodialysis ([place])    ??? HIV (human immunodeficiency virus infection) ([place])    ??? HTN (hypertension) 07/06/2012   ???  HTN (hypertension)    ??? Hypertension    ??? Malingering      Sickle Cell testing negative   ??? Malingering      Patient states has had sickle cell disease since age of 1, hemoglobin electrophoresis at [facility] both negative for for sickle cell disease   ??? Noncompliance of patient with renal dialysis ([place]) 09/06/2013   ??? Sickle cell anemia (HCC)      NEGATIVE test. Likely patient states this Dx for secondary gain   ??? Sickle cell disease ([place])      TOLD BY PATIENT AND NOT TRUE   ??? Stroke ([place])      2014 pts states he has weakness to right side upper and lower extremities        Education Needs:         None identified   Identified - Not appropriate at this time    Identified and addressed - refer to education log  Learning Limitations:    None identified   Identified    Cultural, religious & ethnic food preferences:   None identified     Identified and addressed     NUTRITION NEEDS & DIAGNOSIS:     Estimated Nutrition Needs:   Calories: 2310-2695 kcal (30-35 kcal/kg) based on   Actual BW:       SBW: 77 kg   Protein: 92-116 gm (1.2-1.5 gm/kg) based on   Actual BW:       SBW: 77 kg  Fluid: 1 mL/kcal     Patient expected to meet estimated nutrient needs:    Yes     No      Nutrition Diagnosis: Increased energy needs related to increased energy expenditure as evidenced by ESRD on HD.      MONITORING & EVALUATION:     Nutrition Goal(s):   1. Po intake of meals will meet >75% of patient estimated nutritional needs within the next 5-7 days.    Previous Goal(s) Outcome (for follow-up assessments only): Goal 1:    Met      Not Met        Previous Recommendations (for follow-up assessments only):        Implemented          Not Implemented (RD to address)      No Recommendation Made     Discharge Planning: renal diet    Participated in care planning, discharge planning, & interdisciplinary rounds as appropriate      Chaney Malling, [place], [place]   Pager: [phone]

## 2015-10-23 NOTE — Progress Notes (Signed)
Catheter issues deferred to IR for today  Will otherwise plan bilateral upper extremity central venogram with dr Lorine Bearscamacho on Tuesday and if able then a HeRO graft next Friday

## 2015-10-24 LAB — CBC WITH AUTOMATED DIFF
ABS. BASOPHILS: 0 10*3/uL (ref 0.0–0.06)
ABS. EOSINOPHILS: 0 10*3/uL (ref 0.0–0.4)
ABS. LYMPHOCYTES: 0.5 10*3/uL — ABNORMAL LOW (ref 0.8–3.5)
ABS. MONOCYTES: 0.1 10*3/uL (ref 0–1.0)
ABS. NEUTROPHILS: 1.2 10*3/uL — ABNORMAL LOW (ref 1.8–8.0)
BAND NEUTROPHILS: 5 % (ref 0–5)
BASOPHILS: 0 % (ref 0–3)
EOSINOPHILS: 2 % (ref 0–5)
HCT: 21.4 % — ABNORMAL LOW (ref 36.0–48.0)
HGB: 6.9 g/dL — ABNORMAL LOW (ref 13.0–16.0)
LYMPHOCYTES: 26 % (ref 20–51)
MCH: 29 PG (ref 24.0–34.0)
MCHC: 32.2 g/dL (ref 31.0–37.0)
MCV: 89.9 FL (ref 74.0–97.0)
MONOCYTES: 8 % (ref 2–9)
MPV: 9.9 FL (ref 9.2–11.8)
NEUTROPHILS: 59 % (ref 42–75)
PLATELET COMMENTS: DECREASED
PLATELET: 93 10*3/uL — ABNORMAL LOW (ref 135–420)
RBC: 2.38 M/uL — ABNORMAL LOW (ref 4.70–5.50)
RDW: 19.3 % — ABNORMAL HIGH (ref 11.6–14.5)
WBC: 1.8 10*3/uL — ABNORMAL LOW (ref 4.6–13.2)

## 2015-10-24 LAB — METABOLIC PANEL, BASIC
Anion gap: 6 mmol/L (ref 3.0–18)
BUN/Creatinine ratio: 8 — ABNORMAL LOW (ref 12–20)
BUN: 43 MG/DL — ABNORMAL HIGH (ref 7.0–18)
CO2: 27 mmol/L (ref 21–32)
Calcium: 7.5 MG/DL — ABNORMAL LOW (ref 8.5–10.1)
Chloride: 104 mmol/L (ref 100–108)
Creatinine: 5.45 MG/DL — ABNORMAL HIGH (ref 0.6–1.3)
GFR est AA: 15 mL/min/{1.73_m2} — ABNORMAL LOW (ref >60)
GFR est non-AA: 12 mL/min/{1.73_m2} — ABNORMAL LOW (ref >60)
Glucose: 108 mg/dL — ABNORMAL HIGH (ref 74–99)
Potassium: 4.8 mmol/L (ref 3.5–5.5)
Sodium: 137 mmol/L (ref 136–145)

## 2015-10-24 LAB — PROTHROMBIN TIME + INR
INR: 1.2 (ref 0.8–1.2)
Prothrombin time: 15.1 s (ref 11.5–15.2)

## 2015-10-24 LAB — PHOSPHORUS: Phosphorus: 3.5 MG/DL (ref 2.5–4.9)

## 2015-10-24 MED ORDER — SODIUM CHLORIDE 0.9 % IV PIGGY BACK
INTRAVENOUS | Status: AC
Start: 2015-10-24 — End: 2015-10-24
  Administered 2015-10-24: 14:00:00

## 2015-10-24 MED ORDER — PROMETHAZINE 25 MG/ML INJECTION
25 mg/mL | INTRAMUSCULAR | Status: AC
Start: 2015-10-24 — End: 2015-10-24
  Administered 2015-10-24: 07:00:00

## 2015-10-24 MED ORDER — DIPHENHYDRAMINE HCL 50 MG/ML IJ SOLN
50 mg/mL | INTRAMUSCULAR | Status: AC
Start: 2015-10-24 — End: 2015-10-24
  Administered 2015-10-24: 02:00:00

## 2015-10-24 MED ORDER — PROMETHAZINE 25 MG TAB
25 mg | Freq: Four times a day (QID) | ORAL | Status: DC | PRN
Start: 2015-10-24 — End: 2015-11-01
  Administered 2015-10-24 – 2015-10-31 (×12): via ORAL

## 2015-10-24 MED ORDER — EPOETIN ALFA 40,000 UNIT/ML IJ SOLN
40000 unit/mL | Freq: Once | INTRAMUSCULAR | Status: AC
Start: 2015-10-24 — End: 2015-10-24
  Administered 2015-10-24: 23:00:00 via SUBCUTANEOUS

## 2015-10-24 MED ORDER — SODIUM CHLORIDE 0.9 % IV
INTRAVENOUS | Status: DC | PRN
Start: 2015-10-24 — End: 2015-11-03
  Administered 2015-11-02: 21:00:00 via INTRAVENOUS

## 2015-10-24 MED ORDER — PROMETHAZINE 25 MG/ML INJECTION
25 mg/mL | INTRAMUSCULAR | Status: AC
Start: 2015-10-24 — End: 2015-10-24
  Administered 2015-10-24: 14:00:00

## 2015-10-24 MED ORDER — SODIUM CHLORIDE 0.9 % IV PIGGY BACK
INTRAVENOUS | Status: AC
Start: 2015-10-24 — End: 2015-10-24
  Administered 2015-10-24: 06:00:00

## 2015-10-24 MED ORDER — DIPHENHYDRAMINE HCL 50 MG/ML IJ SOLN
50 mg/mL | Freq: Once | INTRAMUSCULAR | Status: AC
Start: 2015-10-24 — End: 2015-10-23
  Administered 2015-10-24 (×2): via INTRAVENOUS

## 2015-10-24 MED ORDER — HYDROMORPHONE 2 MG TAB
2 mg | ORAL | Status: DC | PRN
Start: 2015-10-24 — End: 2015-10-28
  Administered 2015-10-27: 17:00:00 via ORAL

## 2015-10-24 MED ORDER — HEPARIN LOCK FLUSH 100 UNIT/ML IV SOLN
100 unit/mL | INTRAVENOUS | Status: DC | PRN
Start: 2015-10-24 — End: 2015-11-03

## 2015-10-24 MED FILL — HYDROMORPHONE 0.5 MG/0.5 ML SYRINGE: 0.5 mg/ mL | INTRAMUSCULAR | Qty: 0.5

## 2015-10-24 MED FILL — SODIUM CHLORIDE 0.9 % IV: INTRAVENOUS | Qty: 250

## 2015-10-24 MED FILL — CLONIDINE 0.1 MG TAB: 0.1 mg | ORAL | Qty: 2

## 2015-10-24 MED FILL — SODIUM CHLORIDE 0.9 % IV: INTRAVENOUS | Qty: 1000

## 2015-10-24 MED FILL — HYDROXYUREA 500 MG CAPSULE: 500 mg | ORAL | Qty: 1

## 2015-10-24 MED FILL — DIPHENHYDRAMINE 25 MG CAP: 25 mg | ORAL | Qty: 1

## 2015-10-24 MED FILL — FOLIC ACID 1 MG TAB: 1 mg | ORAL | Qty: 1

## 2015-10-24 MED FILL — DIPRIVAN 10 MG/ML INTRAVENOUS EMULSION: 10 mg/mL | INTRAVENOUS | Qty: 20

## 2015-10-24 MED FILL — SODIUM CITRATE 4 GRAM/100 ML (4 %) SOLUTION: 4 gram /100 mL ( %) | Qty: 2

## 2015-10-24 MED FILL — SODIUM CHLORIDE 0.9 % IV PIGGY BACK: INTRAVENOUS | Qty: 50

## 2015-10-24 MED FILL — FAMOTIDINE 20 MG TAB: 20 mg | ORAL | Qty: 1

## 2015-10-24 MED FILL — CALCIUM ACETATE 667 MG CAP: 667 mg | ORAL | Qty: 1

## 2015-10-24 MED FILL — PROMETHAZINE 25 MG TAB: 25 mg | ORAL | Qty: 1

## 2015-10-24 MED FILL — DIPHENHYDRAMINE HCL 50 MG/ML IJ SOLN: 50 mg/mL | INTRAMUSCULAR | Qty: 1

## 2015-10-24 MED FILL — PROCRIT 40,000 UNIT/ML INJECTION SOLUTION: 40000 unit/mL | INTRAMUSCULAR | Qty: 1

## 2015-10-24 MED FILL — PROMETHAZINE 25 MG/ML INJECTION: 25 mg/mL | INTRAMUSCULAR | Qty: 1

## 2015-10-24 MED FILL — CEFAZOLIN 2 G IN 100 ML 0.9% NS: 2 gram/100 mL | INTRAVENOUS | Qty: 100

## 2015-10-24 MED FILL — OXYCODONE-ACETAMINOPHEN 7.5 MG-325 MG TAB: ORAL | Qty: 1

## 2015-10-24 MED FILL — LIDOCAINE (PF) 20 MG/ML (2 %) IJ SOLN: 20 mg/mL (2 %) | INTRAMUSCULAR | Qty: 5

## 2015-10-24 MED FILL — DEXTROSE 5%-1/4 NORMAL SALINE IV: INTRAVENOUS | Qty: 1000

## 2015-10-24 NOTE — Other (Addendum)
Report received from [place] and Assumed patient care. Patient in bed, awake, alert and oriented x4. Complained of pain to joints, states from sickle cell and generalized pain. [facility] at this time[facility] No [facility] access at this time. Per day RN attempted to start but no success due to difficult IV stick. Left chest wall perma cath, clean, dry, intact. Gown changed for comfort due to dry blood noted on gown.    0022-Patient sleeping soundly with no signs of pain/ discomforts. Left chest wall dialysis catheter dry and intact when checked with vital signs.   0222-Pt was sleeping soundly until lab tech came to draw morning labs. Patient then asked for pain med, benadryl and phenergan, which all are PRN medication. Pt then given those medicine and afterwards requested for snacks for which the pt tolerated crackers and peanut butter without nausea/vomitting. Kept door open for closer monitoring.    0345-Patient awake, he requested for cereal and given to patient. No complaints of pain at this time.  [id number] - Notified Dr. Waneta MartinsSplan, on call for patient's complain of nausea and pain. No IV access, failed attempts by ICU RN's. Now complaining of nausea and pain, unable to keep po med due to nausea. Received new orders. Patient notified.    0715-Bedside shift change report given to [place]obia B. (oncoming nurse) by Andria MeuseA Gevork Ayyad RN (offgoing nurse).  Report given with SBAR, Kardex, Intake/Output, MAR and Recent Results.

## 2015-10-24 NOTE — Progress Notes (Signed)
Assuming care of pnt. No complaints voiced @ this time. Pnt stable. NADN

## 2015-10-24 NOTE — Progress Notes (Signed)
Plan for bilateral upper extremity central venogram Tuesday with hero graft or other graft placement Friday.  Patient aware.

## 2015-10-24 NOTE — Progress Notes (Signed)
Hourly rounding complete.   Safety  Pt resting   [ x ]  On stretcher with side rails up and bed in locked position, call bell within reach  [  ]  Sitting in chair with casters locked, call bell within reach    Toileting  [x  ] pt denies need to use bathroom  [  ] pt assisted to bathroom  [  ] pt assisted with bedpan  [  ] pt independent to bathroom as needed    Ongoing Plan of Care  Plan of care and expected time for test and results reviewed with pt.    Pain Management / Comfort  [ x ] dimmed lights  [  ] warm blanket provided  [ x ] pain assessed  [  ] monitor alarms reviewed    Pnt enroute to dialysis @ this time. Denies any complaints. Verbal alert and oriented.  RR even and nonlabored. CO @ side. NADN

## 2015-10-24 NOTE — Other (Signed)
ACUTE HEMODIALYSIS FLOW SHEET    HEMODIALYSIS ORDERS: Physician: Dr. Ardelle Park     Dialyzer: Revaclear     Duration: 3.5 hr  BFR: 350-400  DFR: 800   Dialysate:  Temp 36 - 37.0      K+   2.0    Ca+  2.5       Na 140     Bicarb 35   Weight: 100.5 kg    Bed Scale      Unable to Obtain       Dry weight/UF Goal: 2500 ml    Access  CVL      Needle Gauge:NA            Heparin    Bolus     Units     Hourly       Units     None     Catheter locking solution : NA       Pre BP: 176/90  Pulse: 84  Temperature: 97.4 Respirations: 18  Tx: NS   ml/Bolus  Other      N/A   Labs: Pre        Post:         N/A   Additional Orders(medications, blood products, hypotension management):         N/A      Time Out/Safety Check   [facility] Consent Verified     CATHETER ACCESS: N/A   Right   Left   IJ     Fem    First use X-ray verified     Tunnel                 Non Tunneled   No S/S infection  Redness  Drainage Cultured Swelling Pain   Medical Aseptic Prep Utilized   Dressing Changed   Biopatch  Date:       Clotted   Patent   Flows: Good  Poor  Reversed   If access problem, Dr. notified: Yes    N/A  Date:       CVL HD cath lumen was oozing due to tear in venous line.      GRAFT/FISTULA ACCESS:  N/A     Right     Left     UE     LE   AVG   AVF        Buttonhole    Medical Aseptic Prep Utilized   No S/S infection  Redness  Drainage Cultured Swelling Pain    Bruit:    Strong     Weak       Thrill :    Strong     Weak       Needle Gauge:   Length:    If access problem, Dr. notified: Yes     N/A  Date:        Please describe access if present and not used:       GENERAL ASSESSMENT:    LUNGS:  Rate: Regualr SaO2% 98 %   N/A     Clear   Coarse   Crackles   Wheezing         Diminished     Location : RLL   LLL    RUL  LUL   Cough: Productive  Dry  N/A   Respirations:  Easy  Labored   Therapy:  RA  NC  l/min    Mask: NRB   Venti       O2%  Ventilator  Intubated   Trach   BiPaP    CARDIAC: Regular       Irregular    Pericardial Rub   JVD          Monitored   Bedside   Remotely monitored  N/A  Rhythm: SNR   EDEMA:  None  Generalized   Pitting  1     2     3     4                   Facial   Pedal    UE   LE   SKIN:    Warm   Hot      Cold    Dry      Pale    Diaphoretic                   Flushed   Jaundiced   Cyanotic   Rash   Weeping   LOC:     Alert      Oriented:     Person      Place  Time                Confused   Lethargic   Medicated   Non-responsive     GI / ABDOMEN:   Flat     Distended     Soft     Firm     Obese                              Diarrhea   Bowel Sounds   Nausea   Vomiting      GU / URINE ASSESSMENT:  Voiding    Oliguria   Anuria     Foley      Incontinent      Incontinent Brief        Bathroom Privileges      PAIN:  0 1  2   3   4   5   6   7   8   9   10              Scale 0-10  Action/Follow Up:    MOBILITY:   Amb     Amb/Assist     Bed     Wheelchair   Stretcher      All Vitals and Treatment Details on Attached Flowsheet       Hospital: [facility]          Room # 360      1st Time Acute   Stat   Routine   Urgent      Acute Room    Bedside   ICU/CCU   ER   Isolation Precautions:   Dialysis    Airborne    Contact     Reverse   Special Considerations:          Blood Consent Verified N/A     ALLERGIES:    NKA             Code Status:   Full Code   DNR   Other           HBsAg ONLY: Date Drawn 10/17/15        Negative Positive Unknown   HBsAb: Date 10/17/15     Susceptible    Immune10 Not Drawn   Drawn     Current Labs:  Date of Labs:          Today      Results for Tyler Ballard, Tyler Ballard (MRN [id number]) as of 10/24/2015 17:01   Ref. Range 10/24/2015 04:13   WBC Latest Ref Range: 4.6 - 13.2 K/uL 1.8 (L)   RBC Latest Ref Range: 4.70 - 5.50 M/uL 2.38 (L)   HGB Latest Ref Range: 13.0 - 16.0 g/dL 6.9 (L)   HCT Latest Ref Range: 36.0 - 48.0 % 21.4 (L)   MCV Latest Ref Range: 74.0 - 97.0 FL 89.9   MCH Latest Ref Range: 24.0 - 34.0 PG 29.0   MCHC Latest Ref Range: 31.0 - 37.0 g/dL 40.9    RDW Latest Ref Range: 11.6 - 14.5 % 19.3 (H)   PLATELET Latest Ref Range: 135 - 420 K/uL 93 (L)   MPV Latest Ref Range: 9.2 - 11.8 FL 9.9   NEUTROPHILS Latest Ref Range: 42 - 75 % 59   BANDS Latest Ref Range: 0 - 5 % 5   LYMPHOCYTES Latest Ref Range: 20 - 51 % 26   MONOCYTES Latest Ref Range: 2 - 9 % 8   EOSINOPHILS Latest Ref Range: 0 - 5 % 2   BASOPHILS Latest Ref Range: 0 - 3 % 0   DF Latest Units:   MANUAL   ABS. NEUTROPHILS Latest Ref Range: 1.8 - 8.0 K/UL 1.2 (L)   ABS. LYMPHOCYTES Latest Ref Range: 0.8 - 3.5 K/UL 0.5 (L)   ABS. MONOCYTES Latest Ref Range: 0 - 1.0 K/UL 0.1   ABS. EOSINOPHILS Latest Ref Range: 0.0 - 0.4 K/UL 0.0   ABS. BASOPHILS Latest Ref Range: 0.0 - 0.06 K/UL 0.0   RBC COMMENTS Latest Units:   POLYCHROMASIA...   PLATELET COMMENTS Latest Units:   DECREASED PLATELETS   INR Latest Ref Range: 0.8 - 1.2   1.2   Prothrombin time Latest Ref Range: 11.5 - 15.2 sec 15.1   Sodium Latest Ref Range: 136 - 145 mmol/L 137   Potassium Latest Ref Range: 3.5 - 5.5 mmol/L 4.8   Chloride Latest Ref Range: 100 - 108 mmol/L 104   CO2 Latest Ref Range: 21 - 32 mmol/L 27   Anion gap Latest Ref Range: 3.0 - 18 mmol/L 6   Glucose Latest Ref Range: 74 - 99 mg/dL 811 (H)   BUN Latest Ref Range: 7.0 - 18 MG/DL 43 (H)   Creatinine Latest Ref Range: 0.6 - 1.3 MG/DL 9.14 (H)   BUN/Creatinine ratio Latest Ref Range: 12 - 20   8 (L)   Calcium Latest Ref Range: 8.5 - 10.1 MG/DL 7.5 (L)   Phosphorus Latest Ref Range: 2.5 - 4.9 MG/DL 3.5   GFR est non-AA Latest Ref Range: >60 ml/min/1.72m2 12 (L)   GFR est AA Latest Ref Range: >60 ml/min/1.64m2 15 (L)  DIET:   Renal     Other      NPO       Diabetic      PRIMARY NURSE REPORT: First initial/Last name/Title      Pre Dialysis: Z. Brewington , RN    Time: [id number]     EDUCATION:     Patient  Other         Knowledge Basis: None Minimal  Substantial    Barriers to learning  N/A    Access Care      S&S of infection      Fluid Management     K+     Procedural    Albumin      Medications      Tx Options      Transplant      Diet      Other   Teaching Tools:   Explain   Demo   Handouts  Video  Patient response:    Verbalized understanding   Teach back   Return demonstration  Requires follow up   Inappropriate due to            RO/HEMODIALYSIS MACHINE SAFETY CHECKS ??? Before each treatment:     Machine Number:                   [facility]                                   Unit Machine # 9 with centralized RO                                   Portable Machine #1/RO serial # [id number]                                   Portable Machine #2/RO serial # [id number]                                   Portable Machine #3/RO serial # [id number]                                                                                                       [facility]                                   Portable Machine #11/RO serial # [id number]                                    Portable Machine #12/RO serial # [id number]  Portable Machine #13/RO serial #  [id number]      Alarm Test:  Pass time 0945         Other:          RO/Machine Log Complete   Temp  36.8           Extracorporeal Circuit Tested for integrity   Dialysate: pH  7.4 Conductivity: Meter   14.0     HD Machine   14.3                  TCD: 14.0  Dialyzer Lot # [id number]            Blood Tubing Lot # [id number]         Saline Lot #  [id number]     CHLORINE TESTING-Before each treatment and every 4 hours    Total Chlorine:  less than 0.1 ppm  Time: 1100  4 Hr/2nd Check Time:    (if greater than 0.1 ppm from Primary then every 30 minutes from Secondary)     TREATMENT INITIATION ??? with Dialysis Precautions:     All Connections Secured                  Saline Line Double Clamped    Venous Parameters Set                   Arterial Parameters Set      Prime Given  200 ml                        Air Foam Detector Engaged      Treatment Initiation Note: Pt arrived to dialysis room. Alert and oriented x 4. No acute distress noted prior to HD rx. Accessed via right HD cath, both lines flushed and aspirated well. Started HD tx using Revaclear dialyzer, 2K+ /2.5 Ca+ dialsylate bath. Dr Ardelle Park at bedside. Verbal order received. MD Verified HD machine setting, and aware of changes.      Medication Dose Volume Route Initials Dialyzer Cleared:   Good  Fair   Poor    Blood processed: 145  L  UF Removed  200 Ml    Post Wt: 100  kg  POst BP: 182/84 Pulse: 81 Respirations: 18 Temperature: 97.5                                   Post Tx Vascular Access: AVF/AVG: Bleeding stopped Art  min. Ven.  Min   N/A                                   Catheter: Locking solution: Heparin 1:1000 Art.  ml  Ven.  ml  N/A                                 Post Assessment:                                    Skin:   Warm   Dry  Diaphoretic     Flushed   Pale  Cyanotic   [facility] Signatures Title Initials  Time Lungs:  Clear     Course  Crackles   Wheezing  Diminished   Walker Shadow RN NP 1300 Cardiac:  Regular    Irregular    Monitor   N/A  Rhythm:           Edema:   None     General      Facial    Pedal     UE     LE       Pain: 0  1  2   3  4   5   6   7   8   9   10          Post Treatment Note: during the HD, blood was oozing for CVS lumen noted. Dr Ardelle Park at the bedside. It was very small tear on venous cath lumen. return blood successfully. Cath port locked with NS. No acute distress noted post tx. Net fluid removed 200 ml. Pt return to room in stable condition post HD tx.      POST TREATMENT PRIMARY NURSE HANDOFF REPORT:     First initial/Last name/Title         Post Dialysis: Benay Pillow, RN Time:  1300     Abbreviations: AVG-arterial venous graft, AVF-arterial venous fistula, IJ-Internal Jugular, Subcl-Subclavian, Fem-Femoral, Tx-treatment,  AP/HR-apical heart rate, DFR-dialysate flow rate, BFR-blood flow rate, AP-arterial pressure, VP-venous pressure, UF-ultrafiltrate, TMP-transmembrane pressure, Ven-Venous, Art-Arterial, RO-Reverse Osmosis

## 2015-10-24 NOTE — Other (Signed)
Patient had about 230  Min HD tx. During the HD, blood was oozing for CVS lumen noted. Dr Ardelle ParkHaque at the bedside, made aware.  It was very small tear on venous cath lumen. Return blood successfully. Cath port locked with NS.       No acute distress noted post tx. Net fluid removed 200 ml. Pt return to room in stable condition post HD tx.

## 2015-10-24 NOTE — Progress Notes (Signed)
Progress Note    Tyler Ballard  34 y.o.      Admit Date: 10/16/2015  Active Problems:    Sickle cell anemia with pain (HCC) (07/05/2012) POA: Yes      ESRD (end stage renal disease) on dialysis (HCC) (07/06/2012) POA: Yes      HTN (hypertension) (07/06/2012) POA: Yes      Renal failure (10/16/2015) POA: Unknown      HIV (human immunodeficiency virus infection) (HCC) (10/17/2015) POA: Unknown      Secondary hyperparathyroidism of renal origin (HCC) (10/18/2015) POA: Yes            Subjective:     Patient was dialyzed & was given 2 units of PRBC  Last night through HD catheter without any problem, this morning brought him to be dialyzed again & give 2 more units of PRBC. At the beginning there was no bleeding but after 1/2 hour again started slow oozing of Blood through th venous line of HD catheter.Stopped dialysis as this is not safe & will put at high risk of Air Embolism. Blood Bank notified that they do not have any blood for him right now as he has SCD.      A comprehensive review of systems was negative except for that written in the History of Present Illness.    Objective:     Visit Vitals   ??? BP 187/87 (BP 1 Location: Right arm, BP Patient Position: At rest)   ??? Pulse 88   ??? Temp 98.7 ??F (37.1 ??C)   ??? Resp 20   ??? Ht  (1.702 m)   ??? Wt 100.7 kg (222 lb 0.1 oz)   ??? SpO2 96%   ??? BMI 34.77 kg/m2         Intake/Output Summary (Last 24 hours) at 10/24/15 1249  Last data filed at 10/24/15 0732   Gross per 24 hour   Intake    340 ml   Output   2500 ml   Net  -2160 ml       Current Facility-Administered Medications   Medication Dose Route Frequency Provider Last Rate Last Dose   ??? heparin (porcine) 100 unit/mL injection 300 Units  300 Units InterCATHeter PRN Chanda Busing, MD       ??? promethazine (PHENERGAN) 25 mg/mL injection            ??? promethazine (PHENERGAN) 25 mg/mL injection            ??? 0.9% sodium chloride (MBP/ADV) 0.9 % infusion        50 mL at 10/24/15 1007    ??? 0.9% sodium chloride infusion 250 mL  250 mL IntraVENous PRN Loreli Dollar, MD       ??? 0.9% sodium chloride infusion 250 mL  250 mL IntraVENous PRN Loreli Dollar, MD       ??? cloNIDine HCl (CATAPRES) tablet 0.1 mg  0.1 mg Oral Q2H PRN Woodward Ku, MD       ??? HYDROmorphone (DILAUDID) syringe 0.5 mg  0.5 mg IntraVENous Q4H PRN Wanda Plump, MD   0.5 mg at 10/24/15 0620   ??? 0.9% sodium chloride infusion 250 mL  250 mL IntraVENous PRN Loreli Dollar, MD       ??? hydrALAZINE (APRESOLINE) 20 mg/mL injection 10 mg  10 mg IntraVENous Q6H PRN Woodward Ku, MD       ??? cloNIDine HCl (CATAPRES) tablet 0.2 mg  0.2 mg Oral BID Woodward Ku, MD  0.2 mg at 10/24/15 0951   ??? promethazine (PHENERGAN) 25 mg in 0.9% sodium chloride 50 mL IVPB  25 mg IntraVENous Q6H PRN Theodis Aguas, MD 200 mL/hr at 10/24/15 0218 25 mg at 10/24/15 7829   ??? diphenhydrAMINE (BENADRYL) capsule 25 mg  25 mg Oral Q6H PRN Betsy Pries, MD   25 mg at 10/23/15 0930   ??? ammonium lactate (LAC-HYDRIN) 12 % lotion   Topical BID Betsy Pries, MD       ??? sodium chloride (NS) flush 5-10 mL  5-10 mL IntraVENous PRN Alben Deeds, CRNA       ??? naloxone (NARCAN) injection 0.1 mg  0.1 mg IntraVENous PRN Alben Deeds, CRNA       ??? glucose chewable tablet 16 g  4 Tab Oral PRN Alben Deeds, CRNA       ??? glucagon (GLUCAGEN) injection 1 mg  1 mg IntraMUSCular PRN Alben Deeds, CRNA       ??? dextrose (D50W) injection syrg 12.5-25 g  25-50 mL IntraVENous PRN Alben Deeds, CRNA       ??? sodium citrate 4 gram /100 mL (4 %) 0.08 g  2 mL Does Not Apply DIALYSIS PRN Loreli Dollar, MD       ??? acetaminophen (TYLENOL) tablet 500 mg  500 mg Oral Q6H PRN Betsy Pries, MD       ??? oxyCODONE-acetaminophen (PERCOCET 7.5) 7.5-325 mg per tablet 1 Tab  1 Tab Oral Q6H PRN Edilia Bo, MD   1 Tab at 10/23/15 0927   ??? calcium acetate (PHOSLO) capsule 667 mg  1 Cap Oral TID WITH MEALS Wanda Plump, MD   667 mg at 10/24/15 0951    ??? famotidine (PEPCID) tablet 20 mg  20 mg Oral DAILY Wanda Plump, MD   20 mg at 10/24/15 0950   ??? folic acid (FOLVITE) tablet 1 mg  1 mg Oral DAILY Wanda Plump, MD   1 mg at 10/24/15 0950   ??? hydroxyurea (HYDREA) chemo cap 500 mg  500 mg Oral DAILY Wanda Plump, MD   500 mg at 10/23/15 0934   ??? sodium citrate 4 gram /100 mL (4 %) 0.08 g  2 mL Hemodialysis DIALYSIS TUE, THU & SAT Loreli Dollar, MD   Stopped at 10/17/15 1000   ??? ondansetron (ZOFRAN) injection 4 mg  4 mg IntraVENous Q6H PRN Ouida Sills, MD   4 mg at 10/21/15 5621        Physical Exam:     Physical Exam:   General:  Alert, cooperative, no distress, appears stated age.   Neck: Supple, symmetrical, trachea midline, no adenopathy, thyroid: no enlargement/tenderness/nodules, no carotid bruit and no JVD.   Lungs:   Clear to auscultation bilaterally.   Heart:  Regular rate and rhythm, S1, S2 normal, no murmur, click, rub or gallop.   Abdomen:   Soft, non-tender. Bowel sounds normal. No masses,  No organomegaly.   Extremities: Extremities normal, atraumatic, no cyanosis or edema, HD catheter on (L) IJ cath.         Data Review:    CBC w/Diff    Recent Labs      10/24/15   0413  10/23/15   1115  10/22/15   2000   WBC  1.8*  1.6*  2.0*   RBC  2.38*  2.03*  2.09*   HGB  6.9*  6.0*  6.2*   HCT  21.4*  19.5*  19.9*  MCV  89.9  96.1  95.2   MCH  29.0  29.6  29.7   MCHC  32.2  30.8*  31.2   RDW  19.3*  16.7*  16.9*    Recent Labs      10/24/15   0413  10/23/15   1115  10/22/15   2000   BANDS  5   --    --    MONOS  8  4  9    EOS  2  8*  6*   BASOS  0  0  0   RDW  19.3*  16.7*  16.9*        Comprehensive Metabolic Profile    Recent Labs      10/24/15   0413  10/23/15   1115  10/22/15   2000   NA  137  136  137   K  4.8  6.3*  5.7*   CL  104  103  103   CO2  27  23  23    BUN  43*  74*  67*   CREA  5.45*  8.56*  7.88*    Recent Labs      10/24/15   0413  10/23/15   1115  10/22/15   2000   CA  7.5*  7.7*  7.5*   PHOS  3.5   --   5.1*                         Impression:       Active Hospital Problems    Diagnosis Date Noted   ??? Secondary hyperparathyroidism of renal origin (HCC) 10/18/2015   ??? HIV (human immunodeficiency virus infection) (HCC) 10/17/2015   ??? Renal failure 10/16/2015   ??? ESRD (end stage renal disease) on dialysis (HCC) 07/06/2012   ??? HTN (hypertension) 07/06/2012   ??? Sickle cell anemia with pain (HCC) 07/05/2012            Plan:     Can not use this catheter for HD as it is leaking /Oozing Blood, Will notify IVR, H?H but has improved to some extent, K is OK, Will give Procrit SQ.      Loreli DollarMosta Tajai Ihde, MD

## 2015-10-24 NOTE — Progress Notes (Signed)
Hospitalist Progress Note    Patient: Tyler Ballard MRN: [id number]  CSN: [id number]    Date of Birth: [birth year]  Age: 34 y.o.  Sex: male    DOA: 10/16/2015 LOS:  LOS: 8 days          Pt feeling fine, no complaints   Had leak from new HD cath again during HD. HD was stopped     Assessment/Plan     1. Non-functioning HD access - s/p HD catheter X 3 placement by vascular and all had break and cant use them, he had 3 bleeding episodes and RRT's. vas sx couldn't open up clotted graft. s/p new cath but cant use now due to leak. D/w renal, new cath on Monday, till then no HD.    2. ESRD / HD per nephrology. HD after new cath, no signs of fluid overload, will monitor K closely. Had full HD yesterday   3. ? Sickle cell anemia: had neg Hb electrophoresis in 2014. Will repeat Hb electrophoresis   4. HTN: BP higher side, will cont ACE, catapres and norvasc  5. HIV/AIDS: asymptomatic, no infections: ID in put noted. Hold bactrim due to # 9.  6. Obesity Body mass index is 34.77 kg/(m^2).  7. pancytopenia in the setting of HIV, no need for oncology eval   8. Acute blood loss Anemia: will 3 units with HD, H/H still low but cant transfuse due to no IV assess. Will monitor   9. Hyperkalemia: better post HD  Pt doesn't have IV access, IR couldn't do peripheral or central line due to all veins are thrombosed, IR recommends intrahepatic line on Monday under GA if needs long trem IV.     D/w renal team in detail   D/w guard at bedside to watch closely        Case discussed with:  Patient  Family  Nursing  Case Management  DVT Prophylaxis:  Lovenox  Hep SQ  SCDs  Coumadin   On Heparin gtt    Vital signs/Intake and Output:  Visit Vitals   ??? BP 187/87 (BP 1 Location: Right arm, BP Patient Position: At rest)   ??? Pulse 88   ??? Temp 98.7 ??F (37.1 ??C)   ??? Resp 20   ??? Ht  (1.702 m)   ??? Wt 100.7 kg (222 lb 0.1 oz)   ??? SpO2 96%   ??? BMI 34.77 kg/m2       Awake alert and oriented.  Ncat. Perrl. Poor dentition  R groin TDC removed.   RRR  cta b.l  Soft nt nd nabs.  AVG.   No focal deficit  No rash  L chest HD cath site dressing clean, no bleeding.     Medications Reviewed      Labs: Results:       Chemistry Recent Labs      10/24/15   0413  10/23/15   1115  10/22/15   2000   GLU  108*  98  113*   NA  137  136  137   K  4.8  6.3*  5.7*   CL  104  103  103   CO2  BUN  43*  74*  67*   CREA  5.45*  8.56*  7.88*   CA  7.5*  7.7*  7.5*   AGAP  BUCR  8*  9*  9*  CBC w/Diff Recent Labs      10/24/15   0413  10/23/15   1115  10/22/15   2000   WBC  1.8*  1.6*  2.0*   RBC  2.38*  2.03*  2.09*   HGB  6.9*  6.0*  6.2*   HCT  21.4*  19.5*  19.9*   PLT  93*  92*  93*   GRANS  59  60  53   LYMPH  26  28  32   EOS  2  8*  6*      Cardiac Enzymes No results for input(s): CPK, CKND1, MYO in the last 72 hours.    No lab exists for component: CKRMB, TROIP   Coagulation Recent Labs      10/24/15   0413  10/23/15   1115   PTP  15.1  14.8   INR  1.2  1.2       Lipid Panel Lab Results   Component Value Date/Time    CHOLESTEROL, TOTAL 85 11/29/2012 09:36 AM    HDL CHOLESTEROL 33 11/29/2012 09:36 AM    LDL, CALCULATED 41.2 11/29/2012 09:36 AM    VLDL, CALCULATED 10.8 11/29/2012 09:36 AM    TRIGLYCERIDE 54 11/29/2012 09:36 AM    CHOL/HDL RATIO 2.6 11/29/2012 09:36 AM      BNP No results for input(s): BNPP in the last 72 hours.   Liver Enzymes No results for input(s): TP, ALB, TBIL, AP, SGOT, GPT in the last 72 hours.    No lab exists for component: DBIL   Thyroid Studies No results found for: T4, T3U, TSH, TSHEXT, TSHEXT     Procedures/imaging: see electronic medical records for all procedures/Xrays and details which were not copied into this note but were reviewed prior to creation of Plan.

## 2015-10-24 NOTE — Consults (Signed)
Behavioral Health  Consult Note    Subjective:     Date of Evaluation:  10/24/2015    Reason for Referral:  Questionable SI    History of Presenting Problem:  The patient is a 34 years old AAM w/ a history of malingering and a PPHx Obesity , HIV +, Chronic anemia requiring BT,  HTN , ESRD on HD from [place] being admitted for missed HD and related electrolyte imbalance. According to patient he missed his HD for 2 weeks as he was incarcerated for violating probation and the facility doesn???t have access to HD. He developed leg jerky movements. He was brought to ED and was found to have - high BNP. Patient's HD access AV fistula was not functioning. He also has a history of cancer for which he is receiving chemotherapy via a left chest catheter.  He states his LUE graft has not worked for some time. At present patient has no psychological complaint. Patient denies A/V/H and H/S/I/P. Patient denies any past suicide attempts. Patient denies anxiety and depressive symptoms. NO hopelessness or helplessness. Patient doesn???t want to be admitted to the [facility]. He only wants medication to help his sleep onset insomnia.     Patient Active Problem List    Diagnosis Date Noted   ??? Secondary hyperparathyroidism of renal origin (HCC) 10/18/2015   ??? HIV (human immunodeficiency virus infection) (HCC) 10/17/2015   ??? ESRD (end stage renal disease) (HCC) 10/17/2015   ??? Renal failure 10/16/2015   ??? ESRD on dialysis (HCC) 11/25/2013   ??? Clotted dialysis access (HCC) 11/03/2013   ??? Dialysis patient (HCC) 11/03/2013   ??? Secondary hyperparathyroidism (HCC) 11/03/2013   ??? Positive blood culture 11/02/2013   ??? Pulmonary edema 09/06/2013   ??? Drug-seeking behavior 09/06/2013   ??? Noncompliance of patient with renal dialysis (HCC) 09/06/2013   ??? Pancytopenia (HCC) 09/05/2013   ??? Nausea & vomiting 09/04/2013   ??? Malingering 08/06/2013   ??? Do not give narcotics 08/06/2013   ??? ARF (acute renal failure) (HCC) 08/02/2013    ??? Sickle cell crisis (HCC) 05/26/2013     Class: Acute   ??? Arthralgia 03/29/2013   ??? Persistent vomiting 03/29/2013   ??? ESRD (end stage renal disease) on dialysis (HCC) 07/06/2012   ??? HTN (hypertension) 07/06/2012   ??? Sickle cell anemia with pain (HCC) 07/05/2012     Past Medical History   Diagnosis Date   ??? AVN (avascular necrosis of bone) (HCC)      right hip   ??? Chronic kidney disease    ??? Chronic kidney disease (CKD), stage V (HCC)    ??? Dialysis patient [facility])    ??? ESRD on hemodialysis (HCC)    ??? HIV (human immunodeficiency virus infection) (HCC)    ??? HTN (hypertension) 07/06/2012   ??? HTN (hypertension)    ??? Hypertension    ??? Malingering      Sickle Cell testing negative   ??? Malingering      Patient states has had sickle cell disease since age of 77, hemoglobin electrophoresis at [facility] and [facility] both negative for for sickle cell disease   ??? Noncompliance of patient with renal dialysis (HCC) 09/06/2013   ??? Sickle cell anemia (HCC)      NEGATIVE test. Likely patient states this Dx for secondary gain   ??? Sickle cell disease (HCC)      TOLD BY PATIENT AND NOT TRUE   ??? Stroke (HCC)      2014 pts  states he has weakness to right side upper and lower extremities       Family History   Problem Relation Age of Onset   ??? Hypertension Mother    ??? Cancer Mother    ??? Sickle Cell Anemia Mother    ??? Hypertension Father    ??? Sickle Cell Anemia Father    ??? Sickle Cell Anemia Sister    ??? Sickle Cell Anemia Other       Social History   Substance Use Topics   ??? Smoking status: Former Smoker   ??? Smokeless tobacco: Not on file   ??? Alcohol use No     Past Surgical History   Procedure Laterality Date   ??? Hx orthopaedic  2012     Rt hip decompression   ??? Hx orthopaedic       rt hip    ??? Hx vascular access       av  fistula right arm.   ??? Hx orthopaedic       RIGHT HIP   ??? Hx vascular access       old avg left arm, avf right arm   ??? Hx orthopaedic       right hip surgery   ??? Hx orthopaedic       right hip     ??? Vascular surgery procedure unlist       Dialysis Access      Prior to Admission medications    Medication Sig Start Date End Date Taking? Authorizing Provider   hydroxyurea (HYDREA) 500 mg capsule Take 500 mg by mouth daily.   Yes Phys Other, MD   folic acid (FOLVITE) 1 mg tablet Take 1 mg by mouth daily.   Yes Phys Other, MD   cloNIDine HCl (CATAPRES) 0.1 mg tablet Take 1 tablet by mouth two (2) times a day. 11/04/13  Yes Vishwas Kathe Mariner, MD   lisinopril (PRINIVIL, ZESTRIL) 10 mg tablet Take 1 tablet by mouth daily. 11/06/13  Yes Vishwas Kathe Mariner, MD   famotidine (PEPCID) 20 mg tablet Take 1 tablet by mouth daily. 11/04/13   Vishwas Kathe Mariner, MD   epoetin alfa (EPOGEN) 10,000 unit/mL injection by SubCUTAneous route once.   Yes Phys Other, MD   calcium acetate (PHOSLO) 667 mg cap Take  by mouth three (3) times daily (with meals).   Yes Phys Other, MD     No Known Allergies       Objective:     Patient Vitals for the past 8 hrs:   BP Temp Pulse Resp SpO2 Weight   10/24/15 0501 152/60 98.8 ??F (37.1 ??C) 84 18 96 % 100.7 kg (222 lb 0.1 oz)       Mental Status exam: WNL           Impression:      Active Problems:    Sickle cell anemia with pain (HCC) (07/05/2012)      ESRD (end stage renal disease) on dialysis (HCC) (07/06/2012)      HTN (hypertension) (07/06/2012)      Renal failure (10/16/2015)      HIV (human immunodeficiency virus infection) (HCC) (10/17/2015)      Secondary hyperparathyroidism of renal origin (HCC) (10/18/2015)        Axis I: Depression due GMC  Axis II: Deferred  Axis III: See chart  Axis IV: Incarcerated  Axis V: GAF: 30    Plan:     Recommendations for Treatment/Conditions:  No further psychiatric interventions  recommended     - Treatment plan:   - 1. Deferred patient???s medical problems to the medical team  - 2. At present there is NO imminent safety concern. Patient denies A/V/H and H/S/I/P. NO Past Suicide attempts.  - 3. NO psychiatric intervention necessary patient refused all  psychotropic medication including SSRI.  - 4. Patient is psychiatrically cleared   - 5. For: anxiety and agitation.  - a. Haldol 2-5 mg PO/IM q4-6 hrs  - b. Ativan 1-2 mg PO/IM q4-6 hrs    Thank you for allowing the psychiatry department to participate in the treatment of this patient.     Referral To:    No psychiatric F/U necessary    Competency Statement:   At the current time, the patient is competent to make informed consent regarding their current medical care and discharge planning and/or financial decisions.

## 2015-10-25 LAB — TYPE AND SCREEN
ABO/Rh: B POS
Antibody Screen: NEGATIVE
Antigen/Antibody: NEGATIVE
Antigen/Antibody: NEGATIVE
Status: TRANSFUSED
Status: TRANSFUSED
Status: TRANSFUSED
Unit Divison: 0
Unit Divison: 0
Unit Divison: 0
Unit Divison: 0
Unit Divison: 0

## 2015-10-25 LAB — CBC WITH AUTOMATED DIFF
ABS. BASOPHILS: 0 10*3/uL (ref 0.0–0.06)
ABS. EOSINOPHILS: 0.1 10*3/uL (ref 0.0–0.4)
ABS. LYMPHOCYTES: 0.4 10*3/uL — ABNORMAL LOW (ref 0.8–3.5)
ABS. MONOCYTES: 0.2 10*3/uL (ref 0–1.0)
ABS. NEUTROPHILS: 0.8 10*3/uL — ABNORMAL LOW (ref 1.8–8.0)
BASOPHILS: 0 % (ref 0–3)
EOSINOPHILS: 4 % (ref 0–5)
HCT: 21.8 % — ABNORMAL LOW (ref 36.0–48.0)
HGB: 6.8 g/dL — ABNORMAL LOW (ref 13.0–16.0)
LYMPHOCYTES: 26 % (ref 20–51)
MCH: 29.3 PG (ref 24.0–34.0)
MCHC: 31.2 g/dL (ref 31.0–37.0)
MCV: 94 FL (ref 74.0–97.0)
MONOCYTES: 10 % — ABNORMAL HIGH (ref 2–9)
MPV: 9.9 FL (ref 9.2–11.8)
NEUTROPHILS: 60 % (ref 42–75)
PLATELET COMMENTS: DECREASED
PLATELET: 69 10*3/uL — ABNORMAL LOW (ref 135–420)
RBC: 2.32 M/uL — ABNORMAL LOW (ref 4.70–5.50)
RDW: 19.7 % — ABNORMAL HIGH (ref 11.6–14.5)
TOTAL CELLS COUNTED: 50
WBC: 1.5 10*3/uL — ABNORMAL LOW (ref 4.6–13.2)

## 2015-10-25 LAB — METABOLIC PANEL, BASIC
Anion gap: 9 mmol/L (ref 3.0–18)
BUN/Creatinine ratio: 8 — ABNORMAL LOW (ref 12–20)
BUN: 53 MG/DL — ABNORMAL HIGH (ref 7.0–18)
CO2: 25 mmol/L (ref 21–32)
Calcium: 7.9 MG/DL — ABNORMAL LOW (ref 8.5–10.1)
Chloride: 103 mmol/L (ref 100–108)
Creatinine: 6.81 MG/DL — ABNORMAL HIGH (ref 0.6–1.3)
GFR est AA: 11 mL/min/{1.73_m2} — ABNORMAL LOW (ref >60)
GFR est non-AA: 9 mL/min/{1.73_m2} — ABNORMAL LOW (ref >60)
Glucose: 113 mg/dL — ABNORMAL HIGH (ref 74–99)
Potassium: 5.4 mmol/L (ref 3.5–5.5)
Sodium: 137 mmol/L (ref 136–145)

## 2015-10-25 LAB — TYPE & SCREEN
ABO/Rh(D): B POS
ANTIGEN/ANTIBODY INFO: NEGATIVE
ANTIGEN/ANTIBODY INFO: NEGATIVE
Antibody screen: NEGATIVE
Status of unit: TRANSFUSED
Status of unit: TRANSFUSED
Status of unit: TRANSFUSED
Unit division: 0
Unit division: 0
Unit division: 0
Unit division: 0
Unit division: 0

## 2015-10-25 LAB — PROTHROMBIN TIME + INR
INR: 1.2 (ref 0.8–1.2)
Prothrombin time: 14.6 s (ref 11.5–15.2)

## 2015-10-25 MED ORDER — AMLODIPINE 5 MG TAB
5 mg | Freq: Every day | ORAL | Status: DC
Start: 2015-10-25 — End: 2015-10-27
  Administered 2015-10-26 – 2015-10-27 (×2): via ORAL

## 2015-10-25 MED ORDER — HYDROMORPHONE (PF) 1 MG/ML IJ SOLN
1 mg/mL | INTRAMUSCULAR | Status: DC | PRN
Start: 2015-10-25 — End: 2015-10-28
  Administered 2015-10-25 – 2015-10-27 (×14): via INTRAMUSCULAR

## 2015-10-25 MED ORDER — SODIUM POLYSTYRENE SULFONATE 15 G/60 ML ORAL SUSP
15 gram/60 mL | ORAL | Status: AC
Start: 2015-10-25 — End: 2015-10-25
  Administered 2015-10-25: 18:00:00 via ORAL

## 2015-10-25 MED ORDER — TRAZODONE 50 MG TAB
50 mg | Freq: Every evening | ORAL | Status: DC | PRN
Start: 2015-10-25 — End: 2015-11-03
  Administered 2015-10-26 – 2015-11-03 (×7): via ORAL

## 2015-10-25 MED ORDER — CLONIDINE 0.1 MG TAB
0.1 mg | Freq: Three times a day (TID) | ORAL | Status: DC
Start: 2015-10-25 — End: 2015-10-27
  Administered 2015-10-25 – 2015-10-27 (×8): via ORAL

## 2015-10-25 MED ORDER — PROMETHAZINE 25 MG RECTAL SUPPOSITORY
25 mg | Freq: Three times a day (TID) | RECTAL | Status: DC | PRN
Start: 2015-10-25 — End: 2015-11-01

## 2015-10-25 MED FILL — OXYCODONE-ACETAMINOPHEN 7.5 MG-325 MG TAB: ORAL | Qty: 1

## 2015-10-25 MED FILL — PROMETHAZINE 25 MG TAB: 25 mg | ORAL | Qty: 1

## 2015-10-25 MED FILL — HYDROXYUREA 500 MG CAPSULE: 500 mg | ORAL | Qty: 1

## 2015-10-25 MED FILL — SODIUM POLYSTYRENE SULFONATE 15 G/60 ML ORAL SUSP: 15 gram/60 mL | ORAL | Qty: 120

## 2015-10-25 MED FILL — CALCIUM ACETATE 667 MG CAP: 667 mg | ORAL | Qty: 1

## 2015-10-25 MED FILL — HYDROMORPHONE (PF) 1 MG/ML IJ SOLN: 1 mg/mL | INTRAMUSCULAR | Qty: 1

## 2015-10-25 MED FILL — CLONIDINE 0.1 MG TAB: 0.1 mg | ORAL | Qty: 2

## 2015-10-25 MED FILL — DIPHENHYDRAMINE 25 MG CAP: 25 mg | ORAL | Qty: 1

## 2015-10-25 MED FILL — FAMOTIDINE 20 MG TAB: 20 mg | ORAL | Qty: 1

## 2015-10-25 MED FILL — FOLIC ACID 1 MG TAB: 1 mg | ORAL | Qty: 1

## 2015-10-25 NOTE — Other (Addendum)
Assumed patient care. Patient in bed, awake, alert and oriented x4. Denies needs at this time.Montine Circle. Jail officer at bedside.     0600-Bathe patient for procedure today. Awake, on and off overnight for complaints of chronic pain from sickle cell to his joints. Patient will fall asleep after pain med is given.  0730 - Bedside shift change report given to ZambiaZenobia (Cabin crewoncoming nurse) by Andria MeuseA Batoul Limes RN (offgoing nurse).  Report given with SBAR, Kardex, Intake/Output, MAR and Recent Results.

## 2015-10-25 NOTE — Progress Notes (Addendum)
Vascular Surgery Progress Note    Admit Date: 10/16/2015  POD 4 Days Post-Op    Procedure:  Procedure(s):  CENTRAL VENA GRAM  REMOVAL OF RIGHT PERITONEAL  TUNNEL DIALYSIS CATHETER         Subjective:     Patient has no new complaints. New LIJ TDC now with leak. This is his third catheter which has cracked. Pt adamantly denies tampering with catheter. H&H decreased. To be transfused at HD once new catheter placed.     Objective:     Blood pressure 170/77, pulse 89, temperature 98.2 ??F (36.8 ??C), resp. rate 18, height 5\' 7"  (1.702 m), weight 220 lb 3.2 oz (99.9 kg), SpO2 96 %.    Temp (24hrs), Avg:98.7 ??F (37.1 ??C), Min:97.6 ??F (36.4 ??C), Max:99.6 ??F (37.6 ??C)      Physical Exam:  GENERAL: alert, cooperative, no distress, appears stated age, LUNG:  no resp difficulty and ABDOMEN:  ND    Labs: Results:       Chemistry Recent Labs      10/25/15   0830  10/24/15   0413  10/23/15   1115   GLU  113*  108*  98   NA  137  137  136   K  5.4  4.8  6.3*   CL  103  104  103   CO2  25  27  23    BUN  53*  43*  74*   CREA  6.81*  5.45*  8.56*   CA  7.9*  7.5*  7.7*   AGAP  9  6  10    BUCR  8*  8*  9*      CBC w/Diff Recent Labs      10/25/15   0830  10/24/15   0413  10/23/15   1115   WBC  1.5*  1.8*  1.6*   RBC  2.32*  2.38*  2.03*   HGB  6.8*  6.9*  6.0*   HCT  21.8*  21.4*  19.5*   PLT  69*  93*  92*   GRANS  60  59  60   LYMPH  26  26  28    EOS  4  2  8*      Microbiology No results for input(s): CULT in the last 72 hours.   Coagulation Recent Labs      10/25/15   0830  10/24/15   0413   PTP  14.6  15.1   INR  1.2  1.2         Data Review: images and reports reviewed    Assessment:     Active Problems:    Sickle cell anemia with pain (HCC) (07/05/2012)      ESRD (end stage renal disease) on dialysis (HCC) (07/06/2012)      HTN (hypertension) (07/06/2012)      Renal failure (10/16/2015)      HIV (human immunodeficiency virus infection) (HCC) (10/17/2015)      Secondary hyperparathyroidism of renal origin (HCC) (10/18/2015)         Plan/Recommendations/Medical Decision Making:     Continue present treatment   No plan for new catheter   IR consulted and recommending intrahepatic line on Monday under GA if needs long trem IV. ??  ? Pt tampering with catheters  Plan for bilateral upper extremity central venogram Tuesday with hero graft or other graft placement Friday.   NPO after midnight.     C.H. Robinson WorldwideChristy Voncile Schwarz PA-C  [phone]

## 2015-10-25 NOTE — Progress Notes (Signed)
Shift report given to Amy, RN. Pnt verbal alert and oriented. RR even and nonlabored. Denies any complaints @ this time. Stable. NADN

## 2015-10-25 NOTE — Progress Notes (Signed)
Feels comfortable, catheter leak issue discussed with Vascular surgery, patient & hospitalist, can not dialyze until new catheter is replaced & no more leak, will have to give kayexalate,discussed with him , he agreed to take, HD tomorrow with blood transfuion once HD access is done, bilateral Venogram on Tuesday with Hero graft/Catheter on Friday, will need dialysis in between. Pancytopenic, keepan eye on ANC .HIT panel eqivocal,  received Procrit SQ yesterday.

## 2015-10-25 NOTE — Progress Notes (Signed)
Hourly rounding complete.   Safety  Pt resting   [ x ]  On stretcher with side rails up and bed in locked position, call bell within reach  [  ]  Sitting in chair with casters locked, call bell within reach    Toileting  [x  ] pt denies need to use bathroom  [  ] pt assisted to bathroom  [  ] pt assisted with bedpan  [  ] pt independent to bathroom as needed    Ongoing Plan of Care  Plan of care and expected time for test and results reviewed with pt.    Pain Management / Comfort  [ x ] dimmed lights  [  ] warm blanket provided  [ x ] pain assessed  [  ] monitor alarms reviewed

## 2015-10-25 NOTE — Progress Notes (Signed)
Hourly rounding complete.   Safety  Pt resting   [ x ]  On stretcher with side rails up and bed in locked position, call bell within reach  [  ]  Sitting in chair with casters locked, call bell within reach    Toileting  [ x ] pt denies need to use bathroom  [  ] pt assisted to bathroom  [  ] pt assisted with bedpan  [  ] pt independent to bathroom as needed    Ongoing Plan of Care  Plan of care and expected time for test and results reviewed with pt.    Pain Management / Comfort  [ x ] dimmed lights  [  ] warm blanket provided  [ x ] pain assessed  [  ] monitor alarms reviewed

## 2015-10-25 NOTE — Progress Notes (Signed)
Hourly rounding complete.   Safety  Pt resting   [ x ]  On stretcher with side rails up and bed in locked position, call bell within reach  [  ]  Sitting in chair with casters locked, call bell within reach    Toileting  [ x ] pt denies need to use bathroom  [  ] pt assisted to bathroom  [  ] pt assisted with bedpan  [  ] pt independent to bathroom as needed    Ongoing Plan of Care  Plan of care and expected time for test and results reviewed with pt.    Pain Management / Comfort  [ x ] dimmed lights  [  ] warm blanket provided  [  ] pain assessed  [  ] monitor alarms reviewed

## 2015-10-25 NOTE — Progress Notes (Signed)
Hospitalist Progress Note    Patient: Tyler Ballard MRN: [id number]  CSN: [id number]    Date of Birth: Jan 22, 1981  Age: 34 y.o.  Sex: male    DOA: 10/16/2015 LOS:  LOS: 9 days          Pt feeling fine, no complaints   Per pt he had SS disease has kid but never had attacks in >10 yrs    Assessment/Plan     1. Non-functioning HD access - s/p HD catheter X 3 placement by vascular and all had break and cant use them, he had 3 bleeding episodes and RRT's. vas sx couldn't open up clotted graft. s/p new cath but cant use now due to leak. D/w renal, new cath on Monday, till then no HD.    2. ESRD / HD per nephrology. HD after new cath, no signs of fluid overload, will monitor K closely. Will give kayexalate today to keep K<5.0   3. ? Sickle cell anemia: had neg Hb electrophoresis in 2014. Will repeat Hb electrophoresis   4. HTN: BP higher side, will increase catapres and add norvasc  5. HIV/AIDS: asymptomatic, no infections: ID in put noted. Hold bactrim due to # 9.  6. Obesity Body mass index is 34.49 kg/(m^2).  7. pancytopenia in the setting of HIV, no need for oncology eval   8. Acute blood loss Anemia: will 3 units with HD, H/H still low but cant transfuse due to no IV assess. Will monitor   9. Hyperkalemia: better, its trending up, kayexalate today, ACE and bactrim on hold   Pt doesn't have IV access, IR couldn't do peripheral or central line due to all veins are thrombosed, IR recommends intrahepatic line on Monday under GA if needs long trem IV.     D/w renal team and pt       Case discussed with:  Patient  Family  Nursing  Case Management  DVT Prophylaxis:  Lovenox  Hep SQ  SCDs  Coumadin   On Heparin gtt    Vital signs/Intake and Output:  Visit Vitals   ??? BP 170/77 (BP 1 Location: Right arm, BP Patient Position: At rest)   ??? Pulse 89   ??? Temp 98.2 ??F (36.8 ??C)   ??? Resp 18   ??? Ht  (1.702 m)   ??? Wt 99.9 kg (220 lb 3.2 oz)   ??? SpO2 96%   ??? BMI 34.49 kg/m2       Awake alert and oriented.   Ncat. Perrl. Poor dentition  RRR  cta b.l  Soft nt nd nabs.  AVG.   No focal deficit  No rash  L chest HD cath site dressing clean, no bleeding.     Medications Reviewed      Labs: Results:       Chemistry Recent Labs      10/25/15   0830  10/24/15   0413  10/23/15   1115   GLU  113*  108*  98   NA  137  137  136   K  5.4  4.8  6.3*   CL  103  104  103   CO2  BUN  53*  43*  74*   CREA  6.81*  5.45*  8.56*   CA  7.9*  7.5*  7.7*   AGAP  BUCR  8*  8*  9*  CBC w/Diff Recent Labs      10/25/15   0830  10/24/15   0413  10/23/15   1115   WBC  1.5*  1.8*  1.6*   RBC  2.32*  2.38*  2.03*   HGB  6.8*  6.9*  6.0*   HCT  21.8*  21.4*  19.5*   PLT  69*  93*  92*   GRANS  60  59  60   LYMPH  26  26  28    EOS  4  2  8*      Cardiac Enzymes No results for input(s): CPK, CKND1, MYO in the last 72 hours.    No lab exists for component: CKRMB, TROIP   Coagulation Recent Labs      10/25/15   0830  10/24/15   0413   PTP  14.6  15.1   INR  1.2  1.2       Lipid Panel Lab Results   Component Value Date/Time    CHOLESTEROL, TOTAL 85 11/29/2012 09:36 AM    HDL CHOLESTEROL 33 11/29/2012 09:36 AM    LDL, CALCULATED 41.2 11/29/2012 09:36 AM    VLDL, CALCULATED 10.8 11/29/2012 09:36 AM    TRIGLYCERIDE 54 11/29/2012 09:36 AM    CHOL/HDL RATIO 2.6 11/29/2012 09:36 AM      BNP No results for input(s): BNPP in the last 72 hours.   Liver Enzymes No results for input(s): TP, ALB, TBIL, AP, SGOT, GPT in the last 72 hours.    No lab exists for component: DBIL   Thyroid Studies No results found for: T4, T3U, TSH, TSHEXT, TSHEXT     Procedures/imaging: see electronic medical records for all procedures/Xrays and details which were not copied into this note but were reviewed prior to creation of Plan.

## 2015-10-25 NOTE — Progress Notes (Signed)
Assuming care of pnt. Pnt verbal alert and oriented. RR even and nonlabored. Stable. NADN

## 2015-10-26 LAB — METABOLIC PANEL, BASIC
Anion gap: 8 mmol/L (ref 3.0–18)
BUN/Creatinine ratio: 8 — ABNORMAL LOW (ref 12–20)
BUN: 65 MG/DL — ABNORMAL HIGH (ref 7.0–18)
CO2: 27 mmol/L (ref 21–32)
Calcium: 7.6 MG/DL — ABNORMAL LOW (ref 8.5–10.1)
Chloride: 101 mmol/L (ref 100–108)
Creatinine: 7.82 MG/DL — ABNORMAL HIGH (ref 0.6–1.3)
GFR est AA: 10 mL/min/{1.73_m2} — ABNORMAL LOW (ref >60)
GFR est non-AA: 8 mL/min/{1.73_m2} — ABNORMAL LOW (ref >60)
Glucose: 88 mg/dL (ref 74–99)
Potassium: 6 mmol/L — ABNORMAL HIGH (ref 3.5–5.5)
Sodium: 136 mmol/L (ref 136–145)

## 2015-10-26 LAB — CBC WITH AUTOMATED DIFF
ABS. BASOPHILS: 0 10*3/uL (ref 0.0–0.06)
ABS. EOSINOPHILS: 0.2 10*3/uL (ref 0.0–0.4)
ABS. LYMPHOCYTES: 0.7 10*3/uL — ABNORMAL LOW (ref 0.8–3.5)
ABS. MONOCYTES: 0.2 10*3/uL (ref 0–1.0)
ABS. NEUTROPHILS: 0.9 10*3/uL — ABNORMAL LOW (ref 1.8–8.0)
BASOPHILS: 0 % (ref 0–3)
EOSINOPHILS: 10 % — ABNORMAL HIGH (ref 0–5)
HCT: 22.1 % — ABNORMAL LOW (ref 36.0–48.0)
HGB: 6.9 g/dL — ABNORMAL LOW (ref 13.0–16.0)
LYMPHOCYTES: 34 % (ref 20–51)
MCH: 29.2 PG (ref 24.0–34.0)
MCHC: 31.2 g/dL (ref 31.0–37.0)
MCV: 93.6 FL (ref 74.0–97.0)
MONOCYTES: 8 % (ref 2–9)
MPV: 10.2 FL (ref 9.2–11.8)
NEUTROPHILS: 48 % (ref 42–75)
PLATELET COMMENTS: DECREASED
PLATELET: 72 10*3/uL — ABNORMAL LOW (ref 135–420)
RBC: 2.36 M/uL — ABNORMAL LOW (ref 4.70–5.50)
RDW: 19.3 % — ABNORMAL HIGH (ref 11.6–14.5)
TOTAL CELLS COUNTED: 50
WBC: 2 10*3/uL — ABNORMAL LOW (ref 4.6–13.2)

## 2015-10-26 LAB — PROTHROMBIN TIME + INR
INR: 1.2 (ref 0.8–1.2)
Prothrombin time: 14.8 s (ref 11.5–15.2)

## 2015-10-26 MED ORDER — SODIUM POLYSTYRENE SULFONATE 15 G/60 ML ORAL SUSP
15 gram/60 mL | ORAL | Status: AC
Start: 2015-10-26 — End: 2015-10-26
  Administered 2015-10-26: 19:00:00 via ORAL

## 2015-10-26 MED FILL — HYDROMORPHONE (PF) 1 MG/ML IJ SOLN: 1 mg/mL | INTRAMUSCULAR | Qty: 1

## 2015-10-26 MED FILL — DIPHENHYDRAMINE 25 MG CAP: 25 mg | ORAL | Qty: 1

## 2015-10-26 MED FILL — TRAZODONE 50 MG TAB: 50 mg | ORAL | Qty: 1

## 2015-10-26 MED FILL — AMLODIPINE 5 MG TAB: 5 mg | ORAL | Qty: 1

## 2015-10-26 MED FILL — CALCIUM ACETATE 667 MG CAP: 667 mg | ORAL | Qty: 1

## 2015-10-26 MED FILL — CLONIDINE 0.1 MG TAB: 0.1 mg | ORAL | Qty: 2

## 2015-10-26 MED FILL — PROMETHAZINE 25 MG TAB: 25 mg | ORAL | Qty: 1

## 2015-10-26 MED FILL — FOLIC ACID 1 MG TAB: 1 mg | ORAL | Qty: 1

## 2015-10-26 MED FILL — FAMOTIDINE 20 MG TAB: 20 mg | ORAL | Qty: 1

## 2015-10-26 MED FILL — SODIUM POLYSTYRENE SULFONATE 15 G/60 ML ORAL SUSP: 15 gram/60 mL | ORAL | Qty: 180

## 2015-10-26 MED FILL — HYDROXYUREA 500 MG CAPSULE: 500 mg | ORAL | Qty: 1

## 2015-10-26 NOTE — Progress Notes (Signed)
Hospitalist Progress Note    Patient: Tyler Ballard MRN: [id number]  CSN: [id number]    Date of Birth: July 25, 1981  Age: 34 y.o.  Sex: male    DOA: 10/16/2015 LOS:  LOS: 10 days          Pt feeling fine, no complaints   Pt declines any tampering of cath by him. He got very anger and started yelling at me.   Vas sx very clear that cath cant break without tempering.    Assessment/Plan     1. Non-functioning HD access - s/p HD catheter X 3 placement by vascular and all had break and cant use them, he had 3 bleeding episodes and RRT's. vas sx couldn't open up clotted graft. s/p new cath but cant use now due to leak. D/w renal, new cath in am by Vas Sx and venogram in am.    2. ESRD / HD per nephrology. HD after new cath, no signs of fluid overload, Will give kayexalate.   3. ? Sickle cell anemia: had neg Hb electrophoresis in 2014. Will repeat Hb electrophoresis   4. HTN: BP better, will cont catapres and norvasc  5. HIV/AIDS: asymptomatic, no infections: ID in put noted. Hold bactrim due to # 9.  6. Obesity Body mass index is 35.63 kg/(m^2).  7. Pancytopenia in the setting of HIV, no need for oncology eval   8. Acute blood loss Anemia: will 3 units with HD, H/H still low but cant transfuse due to no IV assess. Will monitor   9. Hyperkalemia: kayexalate, ACE and bactrim on hold   Pt doesn't have IV access, IR couldn't do peripheral or central line due to all veins are thrombosed, IR recommends intrahepatic line on Monday under GA if needs long trem IV.     D/w renal team and pt  D/w guard at bedside     Case discussed with:  Patient  Family  Nursing  Case Management  DVT Prophylaxis:  Lovenox  Hep SQ  SCDs  Coumadin   On Heparin gtt    Vital signs/Intake and Output:  Visit Vitals   ??? BP 143/74 (BP 1 Location: Right arm, BP Patient Position: At rest)   ??? Pulse 83   ??? Temp 98.1 ??F (36.7 ??C)   ??? Resp 18   ??? Ht  (1.702 m)   ??? Wt 103.2 kg (227 lb 8.2 oz)   ??? SpO2 99%   ??? BMI 35.63 kg/m2        Awake alert and oriented.  Ncat. Perrl. Poor dentition  RRR  cta b.l  Soft nt nd nabs.  AVG.   No focal deficit  No rash  L chest HD cath site dressing clean, no bleeding.     Medications Reviewed      Labs: Results:       Chemistry Recent Labs      10/26/15   0903  10/25/15   0830  10/24/15   0413   GLU  88  113*  108*   NA  136  137  137   K  6.0*  5.4  4.8   CL  101  103  104   CO2  BUN  65*  53*  43*   CREA  7.82*  6.81*  5.45*   CA  7.6*  7.9*  7.5*   AGAP  BUCR  8*  8*  8*      CBC w/Diff Recent Labs      10/26/15   0903  10/25/15   0830  10/24/15   0413   WBC  2.0*  1.5*  1.8*   RBC  2.36*  2.32*  2.38*   HGB  6.9*  6.8*  6.9*   HCT  22.1*  21.8*  21.4*   PLT  72*  69*  93*   GRANS  48  60  59   LYMPH  34  26  26   EOS  10*  4  2      Cardiac Enzymes No results for input(s): CPK, CKND1, MYO in the last 72 hours.    No lab exists for component: CKRMB, TROIP   Coagulation Recent Labs      10/26/15   0903  10/25/15   0830   PTP  14.8  14.6   INR  1.2  1.2       Lipid Panel Lab Results   Component Value Date/Time    CHOLESTEROL, TOTAL 85 11/29/2012 09:36 AM    HDL CHOLESTEROL 33 11/29/2012 09:36 AM    LDL, CALCULATED 41.2 11/29/2012 09:36 AM    VLDL, CALCULATED 10.8 11/29/2012 09:36 AM    TRIGLYCERIDE 54 11/29/2012 09:36 AM    CHOL/HDL RATIO 2.6 11/29/2012 09:36 AM      BNP No results for input(s): BNPP in the last 72 hours.   Liver Enzymes No results for input(s): TP, ALB, TBIL, AP, SGOT, GPT in the last 72 hours.    No lab exists for component: DBIL   Thyroid Studies No results found for: T4, T3U, TSH, TSHEXT, TSHEXT     Procedures/imaging: see electronic medical records for all procedures/Xrays and details which were not copied into this note but were reviewed prior to creation of Plan.

## 2015-10-26 NOTE — Progress Notes (Signed)
Patient has broken every cath placed this admission and one placed at separate facility.  Total of four catheters broken by patient. This is not a material issue, different brand name catheters and 4 different docs.  I do not have anything else to offer. I feel like placing caths for him to break is not safe practise.  Patient is unsafe to self (consider Axis II or just avoiding incarceration) and others (bleeding).  No further surgery from me.  He is on schedule for tomorrow for evaluation for Hero graft.  Very difficult access as well, completely stented subclavians bilateral. IJ access not attainable.

## 2015-10-26 NOTE — Progress Notes (Signed)
Assuming care of pnt. Denies any complaints @ this time. Pnt verbal alert and oriented. RR even and nonlabored. Pnt stable. NADN

## 2015-10-26 NOTE — Progress Notes (Signed)
Patient remained NPO , awaiting for another catheter exchange, once done will dialyze him & give 2 units of PRBC, bilateral Venogram & Hero catheter sometime this week as per Vascular surgery.. Disccussed with IR & Vascular surgery regarding regarding mysterious catheter leak Issue.

## 2015-10-26 NOTE — Consults (Signed)
Interventional Radiology      IR Consult for dialysis catheter exchange by Dr Loreli DollarMosta Haque was received and reviewed with Dr Larry SierrasSamoilov.    No IR intervention at this time.  Pt is scheduled for the OR tomorrow for a Hero catheter.    Case discussed between Dr Larry SierrasSamoilov and Dr Lorine Bearsamacho.      IR to sign off but available if needed.      Tyler RoyaltyAlan P Majesty Stehlin, PA

## 2015-10-26 NOTE — Progress Notes (Signed)
Pt has ESRD and is in need of access.  Difficult patient with minimal options for access.   Has had four different catheters placed since admission and all have mysteriously cracked.   ? Pt likely tampering with catheter.  Plan for left arm HeRO graft placement tomorrow with likely femoral TDC placement.   Do NOT remove LIJ TDC.   NPO after midnight.     C.H. Robinson WorldwideChristy Moreen Piggott PA-C  [phone]

## 2015-10-26 NOTE — Progress Notes (Signed)
Chaplain attended the interdisciplinary rounds for [name], who is a 33 y.o.,male. Patient???s Primary Language is: [place].   According to the patient???s EMR Religious Affiliation is: No religion.     The reason the Patient came to the hospital is:   Patient Active Problem List    Diagnosis Date Noted   ??? Secondary hyperparathyroidism of renal origin (HCC) 10/18/2015   ??? HIV (human immunodeficiency virus infection) (HCC) 10/17/2015   ??? ESRD (end stage renal disease) (HCC) 10/17/2015   ??? Renal failure 10/16/2015   ??? ESRD on dialysis (HCC) 11/25/2013   ??? Clotted dialysis access (HCC) 11/03/2013   ??? Dialysis patient (HCC) 11/03/2013   ??? Secondary hyperparathyroidism (HCC) 11/03/2013   ??? Positive blood culture 11/02/2013   ??? Pulmonary edema 09/06/2013   ??? Drug-seeking behavior 09/06/2013   ??? Noncompliance of patient with renal dialysis (HCC) 09/06/2013   ??? Pancytopenia (HCC) 09/05/2013   ??? Nausea & vomiting 09/04/2013   ??? Malingering 08/06/2013   ??? Do not give narcotics 08/06/2013   ??? ARF (acute renal failure) (HCC) 08/02/2013   ??? Sickle cell crisis (HCC) 05/26/2013     Class: Acute   ??? Arthralgia 03/29/2013   ??? Persistent vomiting 03/29/2013   ??? ESRD (end stage renal disease) on dialysis (HCC) 07/06/2012   ??? HTN (hypertension) 07/06/2012   ??? Sickle cell anemia with pain (HCC) 07/05/2012      Plan:  Chaplains will continue to follow and will provide pastoral care on an as needed/requested basis.    Chaplain Eilene GhaziKerry Pokines   Board Certified Chaplain   Spiritual Care   [phone]

## 2015-10-26 NOTE — Progress Notes (Signed)
Shift report given to Linda, RN. Pnt verbal alert and oriented. RR even and nonlabored. Denies any complaints @ this time. Pnt stable. NADN

## 2015-10-27 ENCOUNTER — Inpatient Hospital Stay: Admit: 2015-10-27 | Payer: PRIVATE HEALTH INSURANCE | Primary: Family Medicine

## 2015-10-27 ENCOUNTER — Inpatient Hospital Stay: Payer: PRIVATE HEALTH INSURANCE | Primary: Family Medicine

## 2015-10-27 LAB — METABOLIC PANEL, BASIC
Anion gap: 7 mmol/L (ref 3.0–18)
Anion gap: 9 mmol/L (ref 3.0–18)
BUN/Creatinine ratio: 9 — ABNORMAL LOW (ref 12–20)
BUN/Creatinine ratio: 8 — ABNORMAL LOW (ref 12–20)
BUN: 75 MG/DL — ABNORMAL HIGH (ref 7.0–18)
BUN: 73 MG/DL — ABNORMAL HIGH (ref 7.0–18)
CO2: 26 mmol/L (ref 21–32)
CO2: 26 mmol/L (ref 21–32)
Calcium: 7.1 MG/DL — ABNORMAL LOW (ref 8.5–10.1)
Calcium: 7.5 MG/DL — ABNORMAL LOW (ref 8.5–10.1)
Chloride: 102 mmol/L (ref 100–108)
Chloride: 100 mmol/L (ref 100–108)
Creatinine: 8.62 MG/DL — ABNORMAL HIGH (ref 0.6–1.3)
Creatinine: 8.73 MG/DL — ABNORMAL HIGH (ref 0.6–1.3)
GFR est AA: 9 mL/min/{1.73_m2} — ABNORMAL LOW (ref >60)
GFR est AA: 9 mL/min/{1.73_m2} — ABNORMAL LOW (ref >60)
GFR est non-AA: 7 mL/min/{1.73_m2} — ABNORMAL LOW (ref >60)
GFR est non-AA: 7 mL/min/{1.73_m2} — ABNORMAL LOW (ref >60)
Glucose: 100 mg/dL — ABNORMAL HIGH (ref 74–99)
Glucose: 91 mg/dL (ref 74–99)
Potassium: 6.1 mmol/L — CR (ref 3.5–5.5)
Potassium: 6.5 mmol/L — CR (ref 3.5–5.5)
Sodium: 135 mmol/L — ABNORMAL LOW (ref 136–145)
Sodium: 135 mmol/L — ABNORMAL LOW (ref 136–145)

## 2015-10-27 LAB — GLUCOSE, POC: Glucose (POC): 86 mg/dL (ref 70–110)

## 2015-10-27 LAB — BLOOD GAS, ARTERIAL POC
Base deficit (POC): 3 mmol/L
FIO2 (POC): 100 %
HCO3 (POC): 23.2 MMOL/L (ref 22–26)
Inspiratory Time: 1 s
PEEP/CPAP (POC): 5 cmH2O
Patient temp.: 98.5
Set Rate: 17 {beats}/min
Tidal volume: 500 ml
Total resp. rate: 15
pCO2 (POC): 45.5 MMHG — ABNORMAL HIGH (ref 35.0–45.0)
pH (POC): 7.314 — ABNORMAL LOW (ref 7.35–7.45)
pO2 (POC): 408 MMHG — ABNORMAL HIGH (ref 80–100)
sO2 (POC): 100 % — ABNORMAL HIGH (ref 92–97)

## 2015-10-27 LAB — CBC WITH AUTOMATED DIFF
ABS. BASOPHILS: 0 10*3/uL (ref 0.0–0.1)
ABS. EOSINOPHILS: 0.1 10*3/uL (ref 0.0–0.4)
ABS. LYMPHOCYTES: 0.7 10*3/uL — ABNORMAL LOW (ref 0.9–3.6)
ABS. MONOCYTES: 0.2 10*3/uL (ref 0.05–1.2)
ABS. NEUTROPHILS: 1 10*3/uL — ABNORMAL LOW (ref 1.8–8.0)
BASOPHILS: 1 % (ref 0–2)
EOSINOPHILS: 6 % — ABNORMAL HIGH (ref 0–5)
HCT: 22 % — ABNORMAL LOW (ref 36.0–48.0)
HGB: 7 g/dL — ABNORMAL LOW (ref 13.0–16.0)
LYMPHOCYTES: 34 % (ref 21–52)
MCH: 29.4 PG (ref 24.0–34.0)
MCHC: 31.8 g/dL (ref 31.0–37.0)
MCV: 92.4 FL (ref 74.0–97.0)
MONOCYTES: 10 % (ref 3–10)
MPV: 10 FL (ref 9.2–11.8)
NEUTROPHILS: 49 % (ref 40–73)
PLATELET: 77 10*3/uL — ABNORMAL LOW (ref 135–420)
RBC: 2.38 M/uL — ABNORMAL LOW (ref 4.70–5.50)
RDW: 19 % — ABNORMAL HIGH (ref 11.6–14.5)
WBC: 2 10*3/uL — ABNORMAL LOW (ref 4.6–13.2)

## 2015-10-27 LAB — PHOSPHORUS: Phosphorus: 5.6 MG/DL — ABNORMAL HIGH (ref 2.5–4.9)

## 2015-10-27 LAB — PROTHROMBIN TIME + INR
INR: 1.2 (ref 0.8–1.2)
Prothrombin time: 15.1 s (ref 11.5–15.2)

## 2015-10-27 MED ORDER — CHLORHEXIDINE GLUCONATE 0.12 % MOUTHWASH
0.12 % | Freq: Two times a day (BID) | Status: DC
Start: 2015-10-27 — End: 2015-10-29
  Administered 2015-10-28 (×2): via ORAL

## 2015-10-27 MED ORDER — ONDANSETRON (PF) 4 MG/2 ML INJECTION
4 mg/2 mL | Freq: Once | INTRAMUSCULAR | Status: DC
Start: 2015-10-27 — End: 2015-10-27
  Administered 2015-10-27: 21:00:00 via INTRAVENOUS

## 2015-10-27 MED ORDER — INSULIN LISPRO 100 UNIT/ML INJECTION
100 unit/mL | Freq: Four times a day (QID) | SUBCUTANEOUS | Status: DC
Start: 2015-10-27 — End: 2015-10-30
  Administered 2015-10-30: 11:00:00 via SUBCUTANEOUS

## 2015-10-27 MED ORDER — FENTANYL CITRATE (PF) 50 MCG/ML IJ SOLN
50 mcg/mL | INTRAMUSCULAR | Status: DC | PRN
Start: 2015-10-27 — End: 2015-10-27

## 2015-10-27 MED ORDER — DOXERCALCIFEROL 4 MCG/2 ML IV SOLN
4 mcg/2 mL | INTRAVENOUS | Status: DC
Start: 2015-10-27 — End: 2015-10-28
  Administered 2015-10-28: via INTRAVENOUS

## 2015-10-27 MED ORDER — SODIUM POLYSTYRENE SULFONATE 15 G/60 ML ORAL SUSP
15 gram/60 mL | ORAL | Status: AC
Start: 2015-10-27 — End: 2015-10-27
  Administered 2015-10-27: 17:00:00 via ORAL

## 2015-10-27 MED ORDER — DEXTROSE 50% IN WATER (D50W) IV SYRG
INTRAVENOUS | Status: DC
Start: 2015-10-27 — End: 2015-10-27

## 2015-10-27 MED ORDER — SODIUM CHLORIDE 0.9 % IJ SYRG
INTRAMUSCULAR | Status: DC | PRN
Start: 2015-10-27 — End: 2015-11-03
  Administered 2015-10-28: 20:00:00 via INTRAVENOUS

## 2015-10-27 MED ORDER — PROPOFOL 10 MG/ML IV EMUL
10 mg/mL | INTRAVENOUS | Status: DC | PRN
Start: 2015-10-27 — End: 2015-10-27
  Administered 2015-10-27: 20:00:00 via INTRAVENOUS

## 2015-10-27 MED ORDER — FAMOTIDINE 20 MG IN 50 ML NS IVPB
20 mg/50 mL | Freq: Two times a day (BID) | INTRAVENOUS | Status: DC
Start: 2015-10-27 — End: 2015-10-27

## 2015-10-27 MED ORDER — FAMOTIDINE 20 MG TAB
20 mg | Freq: Every day | ORAL | Status: DC
Start: 2015-10-27 — End: 2015-10-27

## 2015-10-27 MED ORDER — MIDAZOLAM 1 MG/ML IJ SOLN
1 mg/mL | INTRAMUSCULAR | Status: DC | PRN
Start: 2015-10-27 — End: 2015-10-27
  Administered 2015-10-27 (×2): via INTRAVENOUS

## 2015-10-27 MED ORDER — HEPARIN (PORCINE) IN NS (PF) 1,000 UNIT/500 ML IV
1000 unit/500 mL | INTRAVENOUS | Status: AC
Start: 2015-10-27 — End: 2015-10-28
  Administered 2015-10-27: 21:00:00

## 2015-10-27 MED ORDER — PROPOFOL 10 MG/ML IV EMUL
10 mg/mL | INTRAVENOUS | Status: AC
Start: 2015-10-27 — End: ?

## 2015-10-27 MED ORDER — MIDAZOLAM 1 MG/ML IJ SOLN
1 mg/mL | INTRAMUSCULAR | Status: DC | PRN
Start: 2015-10-27 — End: 2015-10-28

## 2015-10-27 MED ORDER — SODIUM CHLORIDE 0.9 % IV
INTRAVENOUS | Status: DC
Start: 2015-10-27 — End: 2015-10-27

## 2015-10-27 MED ORDER — DOXERCALCIFEROL 4 MCG/2 ML IV SOLN
4 mcg/2 mL | Freq: Once | INTRAVENOUS | Status: DC
Start: 2015-10-27 — End: 2015-10-27

## 2015-10-27 MED ORDER — DEXTROSE 50% IN WATER (D50W) IV SYRG
INTRAVENOUS | Status: DC | PRN
Start: 2015-10-27 — End: 2015-11-03
  Administered 2015-10-28: 05:00:00 via INTRAVENOUS

## 2015-10-27 MED ORDER — FENTANYL CITRATE (PF) 50 MCG/ML IJ SOLN
50 mcg/mL | INTRAMUSCULAR | Status: DC | PRN
Start: 2015-10-27 — End: 2015-10-27
  Administered 2015-10-27 (×3): via INTRAVENOUS

## 2015-10-27 MED ORDER — PROPOFOL INFUSION
10 mg/mL | INTRAVENOUS | Status: DC
Start: 2015-10-27 — End: 2015-10-28
  Administered 2015-10-27 – 2015-10-28 (×7): via INTRAVENOUS

## 2015-10-27 MED ORDER — FAMOTIDINE (PF) 20 MG/2 ML IV
20 mg/2 mL | INTRAVENOUS | Status: DC
Start: 2015-10-27 — End: 2015-10-31
  Administered 2015-10-28 – 2015-10-31 (×4): via INTRAVENOUS

## 2015-10-27 MED ORDER — FAMOTIDINE 20 MG TAB
20 mg | Freq: Once | ORAL | Status: DC
Start: 2015-10-27 — End: 2015-10-27
  Administered 2015-10-27: 13:00:00 via ORAL

## 2015-10-27 MED ORDER — DEXTROSE 5%-1/4 NORMAL SALINE IV
INTRAVENOUS | Status: DC
Start: 2015-10-27 — End: 2015-10-27
  Administered 2015-10-27: 20:00:00 via INTRAVENOUS

## 2015-10-27 MED ORDER — LIDOCAINE (PF) 10 MG/ML (1 %) IJ SOLN
10 mg/mL (1 %) | INTRAMUSCULAR | Status: AC
Start: 2015-10-27 — End: 2015-10-28
  Administered 2015-10-27: 21:00:00

## 2015-10-27 MED ORDER — SODIUM CHLORIDE 0.9 % IJ SYRG
Freq: Three times a day (TID) | INTRAMUSCULAR | Status: DC
Start: 2015-10-27 — End: 2015-11-03
  Administered 2015-10-27 – 2015-11-03 (×19): via INTRAVENOUS

## 2015-10-27 MED ORDER — IPRATROPIUM-ALBUTEROL 2.5 MG-0.5 MG/3 ML NEB SOLUTION
2.5 mg-0.5 mg/3 ml | RESPIRATORY_TRACT | Status: DC
Start: 2015-10-27 — End: 2015-10-28
  Administered 2015-10-28 (×6): via RESPIRATORY_TRACT

## 2015-10-27 MED ORDER — EPOETIN ALFA 10,000 UNIT/ML IJ SOLN
10000 unit/mL | INTRAMUSCULAR | Status: DC
Start: 2015-10-27 — End: 2015-10-27
  Administered 2015-10-28: via INTRAVENOUS

## 2015-10-27 MED ORDER — EPOETIN ALFA 10,000 UNIT/ML IJ SOLN
10000 unit/mL | INTRAMUSCULAR | Status: DC
Start: 2015-10-27 — End: 2015-10-30
  Administered 2015-10-28: 18:00:00 via INTRAVENOUS

## 2015-10-27 MED ORDER — FENTANYL CITRATE (PF) 50 MCG/ML IJ SOLN
50 mcg/mL | INTRAMUSCULAR | Status: AC
Start: 2015-10-27 — End: ?

## 2015-10-27 MED ORDER — MIDAZOLAM 1 MG/ML IJ SOLN
1 mg/mL | INTRAMUSCULAR | Status: AC
Start: 2015-10-27 — End: ?

## 2015-10-27 MED ORDER — FAMOTIDINE 20 MG TAB
20 mg | Freq: Two times a day (BID) | ORAL | Status: DC
Start: 2015-10-27 — End: 2015-10-27

## 2015-10-27 MED ORDER — LIDOCAINE (PF) 20 MG/ML (2 %) IJ SOLN
20 mg/mL (2 %) | INTRAMUSCULAR | Status: DC | PRN
Start: 2015-10-27 — End: 2015-10-27
  Administered 2015-10-27: 20:00:00 via INTRAVENOUS

## 2015-10-27 MED ORDER — SODIUM CHLORIDE 0.9 % IV
100 mg/mL (10%) | Freq: Once | INTRAVENOUS | Status: DC
Start: 2015-10-27 — End: 2015-10-27
  Administered 2015-10-27: 22:00:00 via INTRAVENOUS

## 2015-10-27 MED ORDER — LIDOCAINE (PF) 20 MG/ML (2 %) IJ SOLN
20 mg/mL (2 %) | INTRAMUSCULAR | Status: AC
Start: 2015-10-27 — End: ?

## 2015-10-27 MED ORDER — INSULIN LISPRO 100 UNIT/ML INJECTION
100 unit/mL | Freq: Once | SUBCUTANEOUS | Status: DC
Start: 2015-10-27 — End: 2015-10-27

## 2015-10-27 MED ORDER — CEFAZOLIN 2 GM/50 ML IN DEXTROSE (ISO-OSMOTIC) IVPB
2 gram/50 mL | Freq: Once | INTRAVENOUS | Status: AC
Start: 2015-10-27 — End: 2015-10-27
  Administered 2015-10-27: 20:00:00 via INTRAVENOUS

## 2015-10-27 MED FILL — HEPARIN (PORCINE) IN NS (PF) 1,000 UNIT/500 ML IV: 1000 unit/500 mL | INTRAVENOUS | Qty: 500

## 2015-10-27 MED FILL — HEPARIN LOCK FLUSH 100 UNIT/ML IV SOLN: 100 unit/mL | INTRAVENOUS | Qty: 3

## 2015-10-27 MED FILL — DEXTROSE 5%-1/4 NORMAL SALINE IV: INTRAVENOUS | Qty: 1000

## 2015-10-27 MED FILL — CALCIUM GLUCONATE 100 MG/ML (10%) IV SOLN: 100 mg/mL (10%) | INTRAVENOUS | Qty: 20

## 2015-10-27 MED FILL — CEFAZOLIN 2 GM/50 ML IN DEXTROSE (ISO-OSMOTIC) IVPB: 2 gram/50 mL | INTRAVENOUS | Qty: 50

## 2015-10-27 MED FILL — CLONIDINE 0.1 MG TAB: 0.1 mg | ORAL | Qty: 2

## 2015-10-27 MED FILL — FAMOTIDINE 20 MG TAB: 20 mg | ORAL | Qty: 1

## 2015-10-27 MED FILL — SODIUM CITRATE 4 GRAM/100 ML (4 %) SOLUTION: 4 gram /100 mL ( %) | Qty: 2

## 2015-10-27 MED FILL — HYDROMORPHONE 2 MG TAB: 2 mg | ORAL | Qty: 1

## 2015-10-27 MED FILL — SODIUM POLYSTYRENE SULFONATE 15 G/60 ML ORAL SUSP: 15 gram/60 mL | ORAL | Qty: 180

## 2015-10-27 MED FILL — HYDROMORPHONE (PF) 1 MG/ML IJ SOLN: 1 mg/mL | INTRAMUSCULAR | Qty: 1

## 2015-10-27 MED FILL — PROMETHAZINE 25 MG TAB: 25 mg | ORAL | Qty: 1

## 2015-10-27 MED FILL — CALCIUM ACETATE 667 MG CAP: 667 mg | ORAL | Qty: 1

## 2015-10-27 MED FILL — SODIUM CHLORIDE 0.9 % IV: INTRAVENOUS | Qty: 1000

## 2015-10-27 MED FILL — FOLIC ACID 1 MG TAB: 1 mg | ORAL | Qty: 1

## 2015-10-27 MED FILL — PROCRIT 10,000 UNIT/ML INJECTION SOLUTION: 10000 unit/mL | INTRAMUSCULAR | Qty: 1

## 2015-10-27 MED FILL — DIPHENHYDRAMINE 25 MG CAP: 25 mg | ORAL | Qty: 1

## 2015-10-27 MED FILL — BD POSIFLUSH NORMAL SALINE 0.9 % INJECTION SYRINGE: INTRAMUSCULAR | Qty: 10

## 2015-10-27 MED FILL — TRAZODONE 50 MG TAB: 50 mg | ORAL | Qty: 1

## 2015-10-27 MED FILL — FENTANYL CITRATE (PF) 50 MCG/ML IJ SOLN: 50 mcg/mL | INTRAMUSCULAR | Qty: 5

## 2015-10-27 MED FILL — HECTOROL 4 MCG/2 ML INTRAVENOUS SOLUTION: 4 mcg/2 mL | INTRAVENOUS | Qty: 2

## 2015-10-27 MED FILL — HYDROXYUREA 500 MG CAPSULE: 500 mg | ORAL | Qty: 1

## 2015-10-27 MED FILL — PROPOFOL 10 MG/ML IV EMUL: 10 mg/mL | INTRAVENOUS | Qty: 100

## 2015-10-27 MED FILL — MIDAZOLAM 1 MG/ML IJ SOLN: 1 mg/mL | INTRAMUSCULAR | Qty: 2

## 2015-10-27 MED FILL — AMLODIPINE 5 MG TAB: 5 mg | ORAL | Qty: 1

## 2015-10-27 MED FILL — LIDOCAINE (PF) 10 MG/ML (1 %) IJ SOLN: 10 mg/mL (1 %) | INTRAMUSCULAR | Qty: 30

## 2015-10-27 MED FILL — PROPOFOL 10 MG/ML IV EMUL: 10 mg/mL | INTRAVENOUS | Qty: 20

## 2015-10-27 MED FILL — LIDOCAINE (PF) 20 MG/ML (2 %) IJ SOLN: 20 mg/mL (2 %) | INTRAMUSCULAR | Qty: 5

## 2015-10-27 NOTE — Other (Signed)
1100 Call received from lab with potassium level of 6.5. Doctor Talur paged. Written orders placed by doctor Talur. Annell GreeningZenobia, RN, notified

## 2015-10-27 NOTE — Interval H&P Note (Signed)
H&P Update:  Tyler Ballard was seen and examined.  History and physical has been reviewed. Significant clinical changes have occurred as noted:  Needs dialysis access.    Signed By: Oneita HurtMarc A Dywane Peruski, MD     October 27, 2015 3:01 PM

## 2015-10-27 NOTE — Progress Notes (Signed)
Pt has a pulse, chest compressions stopped. Anesthesia remains at bedside.

## 2015-10-27 NOTE — Other (Signed)
ACUTE HEMODIALYSIS FLOW SHEET    HEMODIALYSIS ORDERS: Physician: Dr. Ardelle Park     Dialyzer: Revaclear         Duration: 3 hr  BFR: 350   DFR: 800   Dialysate:  Temp 36 K+   1    Ca+  3 Na 140 Bicarb 35   Weight:   kg    Bed Scale      Unable to Obtain       Dry weight/UF Goal: 1000 Access TDC  Needle Gauge NA    Heparin   Bolus      Units     Hourly       Units    None     Catheter locking solution: Sodium Citrate   Pre BP:   97/83    Pulse:     77     Temperature:   99.3  Respirations: 16  Tx: NS       ml/Bolus  Other         N/A   Labs: Pre        Post:         N/A   Additional Orders(medications, blood products, hypotension management):        N/A      Time Out/Safety Check   [facility] Consent Verified     CATHETER ACCESS: N/A   Right   Left   IJ     Fem    First use X-ray verified     Tunnel                 Non Tunneled   No S/S infection  Redness  Drainage Cultured Swelling Pain   Medical Aseptic Prep Utilized   Dressing Changed   Biopatch  Date:      Clotted   Patent   Flows: Good  Poor  Reversed   If access problem, Dr. notified: Yes    N/A  Date:           GRAFT/FISTULA ACCESS:  N/A     Right     Left     UE     LE   AVG   AVF        Buttonhole    Medical Aseptic Prep Utilized   No S/S infection  Redness  Drainage Cultured Swelling Pain    Bruit:    Strong     Weak       Thrill :    Strong     Weak       Needle Gauge:    Length:    If access problem, Dr. notified: Yes     N/A  Date:        Please describe access if present and not used:RUA AVF. B/T present       GENERAL ASSESSMENT:    LUNGS:  Rate 100 SaO2%         N/A     Clear   Coarse   Crackles   Wheezing         Diminished     Location : RLL   LLL    RUL  LUL   Cough: Productive  Dry  N/A   Respirations:  Easy  Labored   Therapy:  RA  NC  l/min    Mask: NRB Venti       O2%                  Ventilator  Intubated  Trach   BiPaP   CARDIAC: Regular       Irregular    Pericardial Rub   JVD           Monitored   Bedside   Remotely monitored  N/A  Rhythm:    EDEMA:  None  Generalized   Pitting  Facial   Pedal    UE   LE   SKIN:    Warm   Hot      Cold    Dry      Pale    Diaphoretic                   Flushed   Jaundiced   Cyanotic   Rash   Weeping   LOC:     Alert      Oriented:     Person      Place  Time                Confused   Lethargic   Medicated   Non-responsive     GI / ABDOMEN:   Flat     Distended     Soft     Firm     Obese                              Diarrhea   Bowel Sounds   Nausea   Vomiting      GU / URINE ASSESSMENT: Voiding    Oliguria   Anuria     Foley      Incontinent      Incontinent Brief        Bathroom Privileges     PAIN:  0 Scale 0-10  Action/Follow Up:    MOBILITY:   Amb     Amb/Assist     Bed     Wheelchair   Stretcher      All Vitals and Treatment Details on Attached Flowsheet       Hospital[facility]          Room # 312      1st Time Acute   Stat   Routine   Urgent      Acute Room    Bedside   ICU/CCU   ER   Isolation Precautions:   Dialysis    Airborne    Contact     Reverse   Special Considerations:          Blood Consent Verified N/A     ALLERGIES:    NKA          Code Status:   Full Code   DNR   Other           HBsAg ONLY: Date Drawn 10/17/15         Negative Positive Unknown   HBsAb: Date 10/17/15     Susceptible    Immune10 Not Drawn   Drawn     Current Labs:    Date of Labs:  Today         Cut and paste current labs here.   Results for Tyler Ballard, Tyler Ballard (MRN [id number]) as of 10/27/2015 18:55   Ref. Range 10/27/2015 09:37   Sodium Latest Ref Range: 136 - 145 mmol/L 135 (L)   Potassium Latest Ref Range: 3.5 - 5.5 mmol/L 6.5 (HH)   Chloride Latest Ref Range: 100 - 108 mmol/L 100   CO2 Latest Ref Range: 21 - 32 mmol/L 26   Anion gap Latest Ref Range: 3.0 - 18 mmol/L 9   Glucose Latest Ref Range: 74 - 99 mg/dL 91   BUN Latest Ref Range: 7.0 - 18 MG/DL 73 (H)    Creatinine Latest Ref Range: 0.6 - 1.3 MG/DL 2.53 (H)   BUN/Creatinine ratio Latest Ref Range: 12 - 20   8 (L)   Calcium Latest Ref Range: 8.5 - 10.1 MG/DL 7.5 (L)   GFR est non-AA Latest Ref Range: >60 ml/min/1.65m2 7 (L)   GFR est AA Latest Ref Range: >60 ml/min/1.73m2 9 (L)   Results for Tyler Ballard, Tyler Ballard (MRN [id number]) as of 10/27/2015 18:55   Ref. Range 10/27/2015 05:53   WBC Latest Ref Range: 4.6 - 13.2 K/uL 2.0 (L)   RBC Latest Ref Range: 4.70 - 5.50 M/uL 2.38 (L)   HGB Latest Ref Range: 13.0 - 16.0 g/dL 7.0 (L)   HCT Latest Ref Range: 36.0 - 48.0 % 22.0 (L)   MCV Latest Ref Range: 74.0 - 97.0 FL 92.4   MCH Latest Ref Range: 24.0 - 34.0 PG 29.4   MCHC Latest Ref Range: 31.0 - 37.0 g/dL 25.9   RDW Latest Ref Range: 11.6 - 14.5 % 19.0 (H)   PLATELET Latest Ref Range: 135 - 420 K/uL 77 (L)   MPV Latest Ref Range: 9.2 - 11.8 FL 10.0   NEUTROPHILS Latest Ref Range: 40 - 73 % 49   LYMPHOCYTES Latest Ref Range: 21 - 52 % 34   MONOCYTES Latest Ref Range: 3 - 10 % 10   EOSINOPHILS Latest Ref Range: 0 - 5 % 6 (H)   BASOPHILS Latest Ref Range: 0 - 2 % 1   DF Latest Units:   AUTOMATED   ABS. NEUTROPHILS Latest Ref Range: 1.8 - 8.0 K/UL 1.0 (L)   ABS. LYMPHOCYTES Latest Ref Range: 0.9 - 3.6 K/UL 0.7 (L)   ABS. MONOCYTES Latest Ref Range: 0.05 - 1.2 K/UL 0.2   ABS. EOSINOPHILS Latest Ref Range: 0.0 - 0.4 K/UL 0.1                                                                                                                                 DIET:   Renal     Other      NPO       Diabetic      PRIMARY NURSE REPORT: First initial/Last name/Title      Pre Dialysis: Loma Boston, RN  Time: 1745      EDUCATION:     Patient  Other         Knowledge Basis: None Minimal  Substantial   Barriers to learning Medicated N/A    Access Care      S&S of infection      Fluid Management     K+     Procedural    Albumin      Medications      Tx Options      Transplant      Diet      Other    Teaching Tools:   Explain   Demo   Handouts  Video  Patient response:    Verbalized understanding   Teach back   Return demonstration  Requires follow up   Inappropriate due to            RO/HEMODIALYSIS MACHINE SAFETY CHECKS ??? Before each treatment:     Machine Number:                   [facility]                                   Unit Machine #  with centralized RO                                   Portable Machine #1/RO serial # [id number]                                   Portable Machine #2/RO serial # [id number]                                   Portable Machine #3/RO serial # [id number]                                                                                                       [facility]                                   Portable Machine #11/RO serial # [id number]                                    Portable Machine #12/RO serial # [id number]                                   Portable Machine #13/RO serial #  [id number]      Alarm Test:  Pass time 1751         Other:  RO/Machine Log Complete      Temp    36            Extracorporeal Circuit Tested for integrity   Dialysate: pH  7.2 Conductivity: Meter   14     HD Machine   14                  TCD: 13.5  Dialyzer Lot # [id number]            Blood Tubing Lot # [id number]          Saline Lot #  [id number]     CHLORINE TESTING-Before each treatment and every 4 hours    Total Chlorine:  less than 0.1 ppm  Time: 1805 4 Hr/2nd Check Time:    (if greater than 0.1 ppm from Primary then every 30 minutes from Secondary)     TREATMENT INITIATION ??? with Dialysis Precautions:    All Connections Secured                  Saline Line Double Clamped    Venous Parameters Set                   Arterial Parameters Set     Prime Given  250 ml               Air Foam Detector Engaged      Treatment Initiation Note: Treatment initiated using R femoral TDC, both ports aspirated & flushed well.       Medication Dose Volume Route Initials Dialyzer Cleared:  Good  Fair   Poor    Blood processed:  85.6 L  UF Removed  1000 Ml    Post Wt:     kg  POst BP:   144/42       Pulse: 74      Respirations: 16  Temperature: 97.1          Epogen  10000Units          1ml   IVP      JM  Post Tx Vascular Access: AVF/AVG: Bleeding stopped Art  min. Ven.  Min   N/A     Hectorol               0.93ml  IVP JM Catheter: Locking solution: Sodium Citrat 4G/160ml Art. 1.61ml  Ven. 1.8 ml N/A                                 Post Assessment:                                    Skin:   Warm   Dry  Diaphoretic     Flushed   Pale  Cyanotic   [facility] Signatures Title Initials  Time Lungs Clear     Course   Crackles   Wheezing  Diminished   Peri Maris RN JM  Cardiac:  Regular    Irregular    Monitor   N/A  Rhythm:           Edema:   None     General      Facial    Pedal     UE     LE       Pain: 0  1  2  Post Treatment Note: treatment completed, tolerated well. 2 units PRBC transfused during HD. Removed 1L of fluid. TDC ports flushed with NS, locked with Sodium Citrate. [facility] care performed.     POST TREATMENT PRIMARY NURSE HANDOFF REPORT:     First initial/Last name/Title         Post Dialysis:Leticia Macaraeg, RN Time:  2140     Abbreviations: AVG-arterial venous graft, AVF-arterial venous fistula, IJ-Internal Jugular, Subcl-Subclavian, Fem-Femoral, Tx-treatment, AP/HR-apical heart rate, DFR-dialysate flow rate, BFR-blood flow rate, AP-arterial pressure, VP-venous pressure, UF-ultrafiltrate, TMP-transmembrane pressure, Ven-Venous, Art-Arterial, RO-Reverse Osmosis

## 2015-10-27 NOTE — Progress Notes (Signed)
1700-Received patient from Cardiac OR, patient apparently coded for about 15secs in the OR and received 1 epi, patient is now intubated and waking up and requiring soft wrist restraints, patient also has had a large stool  Turned and cleaned, patient is able to move all four extremities and follow commands, mouthing that he can't breathe but oxygen saturations are 100% on the ventilator. Patient appears to be somewhat stable.  1744-Propofol started at 50 mcg dialysis nurse starting dialysis, first unit of PRBC hung and infusing, dialysis nurse has the second unit and will infuse. Patient relaxed and calm with propofol gtt.

## 2015-10-27 NOTE — Other (Signed)
Bedside and Verbal shift change report given to ZENOBIA RN (oncoming nurse) by LINDA RN (offgoing nurse). Report included the following information SBAR, Kardex and MAR.

## 2015-10-27 NOTE — Consults (Signed)
[facility]  Pulmonary, Critical Care, and Sleep Medicine    Name: Tyler Ballard MRN: [id number]   DOB: 06-07-1981 Hospital: [facility]   Date: 10/27/2015        PCCM Initial Patient Consult                                              Consult requesting physician: Dr. Lorine Bears  Reason for Consult: cardiac arrest     I have reviewed the flowsheet and previous day???s notes. Events, vitals, medications and notes from last 24 hours reviewed. Care plan discussed with staff, patient, family and on multidisciplinary rounds.      IMPRESSION:   ?? PEA cardiac arrest s/p femoral temporary dialysis cath placement, likely d/t hyperkalemia  ?? Acute respiratory failure requiring mechanical ventilation 2' to above  ?? ESRD on HD   ?? Non-functioning HD catheters, has had 3 catheters placed throughout his hospital stay and all have become broken d/t patient tampering  ?? Electrolyte derangement  ?? HIV/AIDS, not receiving treatment at this time  ?? Pancytopenia 2' to above  ?? Hx HTN  ?? Hx AVN of R hip s/p replacement  ?? Hx malingering   ?? Code status: full      RECOMMENDATIONS:   ?? CVS: close monitoring of HD.  Pt did receive neo intraoperatively.  His BP is in the low 100s but may need vasopressors if goes any lower.  Check cardiac enzymes now and then trend q6.  ECHO reviewed.  Hold home BP meds.   ?? Respiratory: CXR now to check for ETT placement. Ventilator protocol, HOB >30, daily ABG and CXR.  If awake will do SBT in the AM  ?? ID:  Send BCx, UCx, and sputum cx.  Hold empiric abx, no active s/sx of sepsis at this time and doubt that events are related to sepsis. Recent diagnosis of HIV and has not followed up with ID, not on HAART.  ?? Hematology/Oncology:  Pancytopenia in unchanged from baseline.  Will check repeat CBC now.  Pt reports a hx of sickle cell but electrophoresis is negative  ?? Renal:  Emergent dialysis now.  Check BMP.    ?? GI/GU:  NGT to LIWS.    ?? Endocrine:  SSI and accuchecks q6.   ?? Neurology/Pain/Sedation:   Not a candidate for hypothermia d/t PEA arrest and waking up post arrest. Sedation with propofol for RASS -2, prn versed  ?? Prophylaxis: GI Prophylaxis with pepcid. DVT Prophylaxis with SCDs.  ??                                                                    Subjective/History of Present Illness:   Tyler Ballard has been seen and evaluated in ICU.  Patient is a 34 y.o. male with PMHx significant for HIV/AIDS, HTN, ESRD.  [facility] from jail after having a non-functioning dialysis catheter that needed to be replaced and electrolyte dysfunction.   His hospital course was complicated by 3 broken dialysis catheters d/t pt tampering.  He went to the OR today to have a temoporary dialysis  catheter placed, procedure was uneventful but just after the procedure was finished, his HR began to brady down and he became pulseless.  CPR was started with very brief round of compressions and ROSC was obtained after 1 dose epi given.  He was transferred to ICU for higher level of care.      The patient is critically ill and can not provide additional history due to Ventilated.   History taken from Dr Sherlynn Stalls, CRNA, EMR    Review of Systems:  Review of systems not obtained due to patient factors.      No Known Allergies   Past Medical History   Diagnosis Date   ??? AVN (avascular necrosis of bone) (HCC)      right hip   ??? Chronic kidney disease    ??? Chronic kidney disease (CKD), stage V (HCC)    ??? Dialysis patient [facility])    ??? ESRD on hemodialysis (HCC)    ??? HIV (human immunodeficiency virus infection) (HCC)    ??? HTN (hypertension) 07/06/2012   ??? HTN (hypertension)    ??? Hypertension    ??? Malingering      Sickle Cell testing negative   ??? Malingering      Patient states has had sickle cell disease since age of 58, hemoglobin electrophoresis at [facility] hospital and [facility] both negative for for sickle cell disease    ??? Noncompliance of patient with renal dialysis (HCC) 09/06/2013   ??? Sickle cell anemia (HCC)      NEGATIVE test. Likely patient states this Dx for secondary gain   ??? Sickle cell disease (HCC)      TOLD BY PATIENT AND NOT TRUE   ??? Stroke (HCC)      2014 pts states he has weakness to right side upper and lower extremities       Past Surgical History   Procedure Laterality Date   ??? Hx orthopaedic  2012     Rt hip decompression   ??? Hx orthopaedic       rt hip    ??? Hx vascular access       av  fistula right arm.   ??? Hx orthopaedic       RIGHT HIP   ??? Hx vascular access       old avg left arm, avf right arm   ??? Hx orthopaedic       right hip surgery   ??? Hx orthopaedic       right hip    ??? Vascular surgery procedure unlist       Dialysis Access      Social History   Substance Use Topics   ??? Smoking status: Former Smoker   ??? Smokeless tobacco: Not on file   ??? Alcohol use No      Family History   Problem Relation Age of Onset   ??? Hypertension Mother    ??? Cancer Mother    ??? Sickle Cell Anemia Mother    ??? Hypertension Father    ??? Sickle Cell Anemia Father    ??? Sickle Cell Anemia Sister    ??? Sickle Cell Anemia Other       Prior to Admission medications    Medication Sig Start Date End Date Taking? Authorizing Provider   hydroxyurea (HYDREA) 500 mg capsule Take 500 mg by mouth daily.   Yes Phys Other, MD   folic acid (FOLVITE) 1 mg tablet Take 1 mg by mouth daily.   Yes Phys  Other, MD   cloNIDine HCl (CATAPRES) 0.1 mg tablet Take 1 tablet by mouth two (2) times a day. 11/04/13  Yes Vishwas Kathe MarinerJ Patel, MD   lisinopril (PRINIVIL, ZESTRIL) 10 mg tablet Take 1 tablet by mouth daily. 11/06/13  Yes Vishwas Kathe MarinerJ Patel, MD   famotidine (PEPCID) 20 mg tablet Take 1 tablet by mouth daily. 11/04/13   Vishwas Kathe MarinerJ Patel, MD   epoetin alfa (EPOGEN) 10,000 unit/mL injection by SubCUTAneous route once.   Yes Phys Other, MD   calcium acetate (PHOSLO) 667 mg cap Take  by mouth three (3) times daily (with meals).   Yes Phys Other, MD      Current Facility-Administered Medications   Medication Dose Route Frequency   ??? sodium chloride (NS) flush 5-10 mL  5-10 mL IntraVENous Q8H   ??? famotidine (PEPCID) tablet 20 mg  20 mg Oral ONCE   ??? lidocaine (PF) (XYLOCAINE) 10 mg/mL (1 %) injection       ??? heparinized saline 2 units/mL 1,000 unit/500 mL infusion       ??? calcium gluconate 2 g in 0.9% sodium chloride 100 mL IVPB  2 g IntraVENous ONCE   ??? 0.9% sodium chloride infusion  150 mL/hr IntraVENous CONTINUOUS   ??? dextrose (D50W) injection syrg 25 g  25 g IntraVENous NOW   ??? insulin lispro (HUMALOG) injection 10 Units  10 Units SubCUTAneous ONCE   ??? epoetin alfa (EPOGEN;PROCRIT) injection 10,000 Units  10,000 Units IntraVENous DIALYSIS TUE, THU & SAT   ??? doxercalciferol (HECTOROL) 4 mcg/2 mL injection 1 mcg  1 mcg IntraVENous ONCE   ??? chlorhexidine (PERIDEX) 0.12 % mouthwash 10 mL  10 mL Oral Q12H   ??? albuterol-ipratropium (DUO-NEB) 2.5 MG-0.5 MG/3 ML  3 mL Nebulization Q4H RT   ??? propofol (DIPRIVAN) infusion  5-50 mcg/kg/min IntraVENous TITRATE   ??? famotidine (PEPCID) tablet 20 mg  20 mg Oral Q12H   ??? insulin lispro (HUMALOG) injection   SubCUTAneous Q6H   ??? ammonium lactate (LAC-HYDRIN) 12 % lotion   Topical BID   ??? calcium acetate (PHOSLO) capsule 667 mg  1 Cap Oral TID WITH MEALS   ??? folic acid (FOLVITE) tablet 1 mg  1 mg Oral DAILY   ??? hydroxyurea (HYDREA) chemo cap 500 mg  500 mg Oral DAILY   ??? sodium citrate 4 gram /100 mL (4 %) 0.08 g  2 mL Hemodialysis DIALYSIS TUE, THU & SAT       Telemetry: normal sinus rhythm    Objective:   Vital Signs:    Visit Vitals   ??? BP 137/87 (BP 1 Location: Right arm, BP Patient Position: At rest)   ??? Pulse 82   ??? Temp 98 ??F (36.7 ??C)   ??? Resp (P) 15   ??? Ht 5\' 7"  (1.702 m)   ??? Wt 105.9 kg (233 lb 7.5 oz)   ??? SpO2 (P) 100%   ??? BMI 36.57 kg/m2       O2 Device: Room air   O2 Flow Rate (L/min): 2 l/min   Temp (24hrs), Avg:97.8 ??F (36.6 ??C), Min:97.1 ??F (36.2 ??C), Max:98 ??F (36.7 ??C)       Intake/Output:    Last shift:      11/08 0701 - 11/08 1900  In: 50 [I.V.:50]  Out: 1 [Urine:1]    Last 3 shifts: 11/06 1901 - 11/08 0700  In: 1680 [P.O.:1680]  Out: 0       Intake/Output Summary (Last 24 hours) at 10/27/15  1714  Last data filed at 10/27/15 1640   Gross per 24 hour   Intake    650 ml   Output      1 ml   Net    649 ml       Last 3 Recorded Weights in this Encounter    10/25/15 0346 10/26/15 0626 10/27/15 0507   Weight: 99.9 kg (220 lb 3.2 oz) 103.2 kg (227 lb 8.2 oz) 105.9 kg (233 lb 7.5 oz)       Ventilator Settings:  Mode Rate Tidal Volume Pressure FiO2 PEEP                    Peak airway pressure:      Plateau pressure:     Tidal volume:    Minute ventilation:     SPO2      ARDS network Guidelines: Lung protective strategy and Plateau pressure goals less than or equal to 30      Physical Exam:     General/Neurology: intubated, sedated but easily arousable, moving around but not following commands.  Head:   Normocephalic, without obvious abnormality, atraumatic.  Eye:   PERRL, no scleral icterus, no pallor, no cyanosis  Throat:  ETT   Neck:   Supple, no JVD, trachea midline  Lung:   Adequate air entry bilateral equal, no rales, no rhonchi, no wheezing.  Heart:   Regular rate & rhythm. S1 S2 present, no murmur, no gallop, no click, no rub  Abdomen:  Soft other than chronic edematous changes on L and R lateral abdomen, hypoactive BS, no appreciated tenderness   Extremities:  Chronic edematous changes  Pulses: 2+ and symmetric in DP  Skin:   Color, texture, turgor normal. No rashes or lesions  Devices: ETT, R femoral dialysis catheter, L subclavian dialysis catheter      Data:     Labs: Results:       Chemistry Recent Labs      10/27/15   0937  10/27/15   0553  10/26/15   0903   GLU  91  100*  88   NA  135*  135*  136   K  6.5*  6.1*  6.0*   CL  100  102  101   CO2  26  26  27    BUN  73*  75*  65*   CREA  8.73*  8.62*  7.82*   CA  7.5*  7.1*  7.6*   AGAP  9  7  8    BUCR  8*  9*  8*      CBC w/Diff Recent Labs       10/27/15   0553  10/26/15   0903  10/25/15   0830   WBC  2.0*  2.0*  1.5*   RBC  2.38*  2.36*  2.32*   HGB  7.0*  6.9*  6.8*   HCT  22.0*  22.1*  21.8*   PLT  77*  72*  69*   GRANS  49  48  60   LYMPH  34  34  26   EOS  6*  10*  4      Coagulation Recent Labs      10/27/15   0553  10/26/15   0903   PTP  15.1  14.8   INR  1.2  1.2       Liver Enzymes No results for input(s): TP, ALB, TBIL, AP, SGOT, GPT in  the last 72 hours.    No lab exists for component: DBIL   ABG No results found for: PH, PHI, PCO2, PCO2I, PO2, PO2I, HCO3, HCO3I, FIO2, FIO2I   Microbiology No results for input(s): CULT in the last 72 hours.         Results from Hospital Encounter encounter on 10/16/15   XR ANGIO AV SHNT HEMODIAL   Narrative FLUOROSCOPY WAS PROVIDED FOR THIS PROCEDURE UNDER THE SUPERVISION OF THE  ATTENDING PROVIDER.         Impression IMPRESSION:? FLUOROSCOPY WAS PROVIDED FOR THIS PROCEDURE UNDER THE SUPERVISION  OF THE ATTENDING PROVIDER.    FLUORO TIME:  00.43    AJH            The patient is:  acutely ill Risk of deterioration:  moderate     critically ill   high     See my orders for details    My assessment, plan of care, findings, medications, side effects etc were discussed with:   Nurse  PT/OT     Respiratory therapy  Dr. Zollie Pee, Dr Sherlynn Stalls    Pharmacist  MDR    Family: answered all questions to satisfaction  Patient: answered all questions to satisfaction            Case & management strategies discussed today on multidisciplinary rounds    High complexity decision making was performed during the evaluation of this patient at high risk for decompensation with multiple organ involvement    Total critical care time spent on reviewing the case/medical record/data/notes/EMR/patient examination/documentation/coordinating care with nurse/consultants, exclusive of procedures >55 minutes with complex decision making performed and > 50% time spent in face to face evaluation.      [address] [place], [place]  10/27/2015

## 2015-10-27 NOTE — Other (Signed)
Patient on HD treatment and First unit of PRBC transfused, and 2nd unittobe given by HD nurse at the end of treatment.

## 2015-10-27 NOTE — Op Note (Signed)
[facility]  OPERATIVE REPORTS    Name:  Tyler BoehringerSLAUGHTER, Tyler Ballard  MR#:  [id number]  DOB:  [birth year]  Account #:  [id number]  Date of Adm:  10/16/2015  Date of Surgery:  10/27/2015      PREOPERATIVE DIAGNOSIS: End-stage renal disease in need of  dialysis access.    POSTOPERATIVE DIAGNOSIS: End-stage renal disease in need of  dialysis access.    PROCEDURES PERFORMED: Ultrasound-guided access of right  common femoral vein with placement of Mahurkar 24 cm temporary  dialysis catheter.    ESTIMATED BLOOD LOSS: Less than 50 mL.    CULTURES: None.    SPECIMENS REMOVED: None.    ANESTHESIA:    DRAINS: None.    INDICATIONS FOR THE PROCEDURE: The patient is a 34 year old  gentleman with end-stage renal disease in need of dialysis access.  The patient was originally planned to place a temporary dialysis  catheter with Hero graft placement. Unfortunately, the patient is  hyperkalemic, as well as anemic. It was decided to go ahead and place  a temporary dialysis catheter, and it was deemed too dangerous to  perform the Hero graft at this current time. Will reschedule for Friday.  The patient was understanding of all of this, and given the risks and  benefits of the procedure, including but not limited to bleeding,  infection, damage to adjacent structures, MI, stroke, and death, as well  as DVT. The patient was understanding of all the risks and underwent  the procedure.    DESCRIPTION OF PROCEDURE: The patient was correctly identified  in the preoperative holding area and taken to the operating room in  stable condition. The patient had a pre-incision time-out prior to any  incision. The patient was prepped and draped in normal sterile fashion. Ultrasound was then used to visualize the right  common femoral vein and then we were able to take a single wall entry   needle and gain access into the common femoral vein. Once the  needle tip was identified within the vein and there was positive blood  return, a  wire was then able to be placed. We then made a small skin  incision using a #11 blade and we dilated the tract x2 and then placed  a 24 cm Mahurkar catheter in place. We then were able to remove the  wire, flush all 3 ports easily as well as aspirate all 3 ports easily. We  then were able to secure the catheter with 2-0 nylon sutures at both  butterfly holes. Biopatch and Tegaderm were then placed and then all  ports were capped appropriately. The patient was then taken care of  and slowly awakened from his anesthesia.        Margaretmary DysMARC Betzabe Bevans, MD    EC / RS  D:  10/27/2015   15:38  T:  10/28/2015   04:09  Job #:  [id number]

## 2015-10-27 NOTE — Progress Notes (Signed)
RENAL PROGRESS NOTE: Pt seen on dialysis.  Follow up of ESRD     Present on Admission:  ??? ESRD (end stage renal disease) on dialysis [facility](HCC)  ??? HTN (hypertension)  ??? Sickle cell anemia with pain (HCC)  ??? Secondary hyperparathyroidism of renal origin (HCC)         Subjective: All  Events noted,earlier discussed with Dr.Talur & Dialysis Nurse.    Patient is on Dialysis.     Objective:Sedated, on Ventilator,was fighting with Ventilator,nurse  had to give Profolol. 1st unit of Transfusion in progress    Patient Vitals for the past 12 hrs:   Temp Pulse Resp BP SpO2   10/27/15 1800 - 90 14 97/83 100 %   10/27/15 1745 98.9 ??F (37.2 ??C) 89 17 (!) 157/96 100 %   10/27/15 1730 - 91 13 (!) 140/106 100 %   10/27/15 1719 98.5 ??F (36.9 ??C) 91 15 (!) 114/34 100 %   10/27/15 1715 - 93 12 (!) 114/34 100 %   10/27/15 1702 - - 15 - 100 %   10/27/15 1700 97.8 ??F (36.6 ??C) 88 14 103/78 100 %   10/27/15 1345 98 ??F (36.7 ??C) 82 18 137/87 97 %   10/27/15 0956 97.9 ??F (36.6 ??C) 81 18 120/80 100 %        Intake/Output Summary (Last 24 hours) at 10/27/15 1819  Last data filed at 10/27/15 1640   Gross per 24 hour   Intake    410 ml   Output      1 ml   Net    409 ml       Physical Assessment:     General Appearance: intubated  Lung: clear to auscultation  Heart: regular rate and rhythm and no murmurs, clicks or gallops  Lower Extremities: edema   Access: Temporary HD catheter is working fine, qb 400.    Labs    CBC w/Diff    Recent Labs      10/27/15   0553  10/26/15   0903  10/25/15   0830   WBC  2.0*  2.0*  1.5*   RBC  2.38*  2.36*  2.32*   HGB  7.0*  6.9*  6.8*   HCT  22.0*  22.1*  21.8*   MCV  92.4  93.6  94.0   MCH  29.4  29.2  29.3   MCHC  31.8  31.2  31.2   RDW  19.0*  19.3*  19.7*    Recent Labs      10/27/15   0553  10/26/15   0903  10/25/15   0830   MONOS  10  8  10*   EOS  6*  10*  4   BASOS  1  0  0   RDW  19.0*  19.3*  19.7*        Comprehensive Metabolic Profile    Recent Labs      10/27/15   0937  10/27/15   0553  10/26/15    0903   NA  135*  135*  136   K  6.5*  6.1*  6.0*   CL  100  102  101   CO2  26  26  27    BUN  73*  75*  65*   CREA  8.73*  8.62*  7.82*    Recent Labs      10/27/15   0937  10/27/15   0553  10/26/15  0903   CA  7.5*  7.1*  7.6*   PHOS   --   5.6*   --           Basic Metabolic Profile       results  reviwed.      Using 1 k , 3 calcium bath.    MEDS:Reviwed.  Current Facility-Administered Medications   Medication Dose Route Frequency Provider Last Rate Last Dose   ??? sodium chloride (NS) flush 5-10 mL  5-10 mL IntraVENous Q8H Miquel Dunn, CRNA       ??? sodium chloride (NS) flush 5-10 mL  5-10 mL IntraVENous PRN Miquel Dunn, CRNA       ??? famotidine (PEPCID) tablet 20 mg  20 mg Oral ONCE Miquel Dunn, CRNA       ??? lidocaine (PF) (XYLOCAINE) 10 mg/mL (1 %) injection            ??? heparinized saline 2 units/mL 1,000 unit/500 mL infusion            ??? 0.9% sodium chloride infusion  150 mL/hr IntraVENous CONTINUOUS Woodward Ku, MD       ??? dextrose (D50W) injection syrg 25 g  25 g IntraVENous NOW Woodward Ku, MD       ??? insulin lispro (HUMALOG) injection 10 Units  10 Units SubCUTAneous ONCE Basavalinga S Talur-Matadha, MD       ??? epoetin alfa (EPOGEN;PROCRIT) injection 10,000 Units  10,000 Units IntraVENous DIALYSIS TUE, THU & SAT Loreli Dollar, MD       ??? doxercalciferol (HECTOROL) 4 mcg/2 mL injection 1 mcg  1 mcg IntraVENous ONCE Loreli Dollar, MD       ??? chlorhexidine (PERIDEX) 0.12 % mouthwash 10 mL  10 mL Oral Q12H Miranda Tamok, PA       ??? albuterol-ipratropium (DUO-NEB) 2.5 MG-0.5 MG/3 ML  3 mL Nebulization Q4H RT Miranda Tamok, PA       ??? propofol (DIPRIVAN) infusion  5-50 mcg/kg/min IntraVENous TITRATE Miranda Tamok, PA 31.8 mL/hr at 10/27/15 1744 50 mcg/kg/min at 10/27/15 1744   ??? midazolam (VERSED) injection 1 mg  1 mg IntraVENous Q2H PRN Miranda Tamok, PA       ??? insulin lispro (HUMALOG) injection   SubCUTAneous Q6H Miranda Tamok, PA        ??? dextrose (D50W) injection syrg 12.5-25 g  25-50 mL IntraVENous PRN Miranda Tamok, PA       ??? famotidine (PEPCID) tablet 20 mg  20 mg Oral DAILY Miranda Tamok, PA       ??? promethazine (PHENERGAN) suppository 25 mg  25 mg Rectal Q8H PRN Thomas P. Splan, MD       ??? HYDROmorphone (PF) (DILAUDID) injection 1 mg  1 mg IntraMUSCular Q4H PRN Thomas P. Splan, MD   1 mg at 10/27/15 1134   ??? heparin (porcine) 100 unit/mL injection 300 Units  300 Units InterCATHeter PRN Chanda Busing, MD       ??? 0.9% sodium chloride infusion 250 mL  250 mL IntraVENous PRN Loreli Dollar, MD       ??? promethazine (PHENERGAN) tablet 25 mg  25 mg Oral Q6H PRN Woodward Ku, MD   25 mg at 10/27/15 1134   ??? HYDROmorphone (DILAUDID) tablet 2 mg  2 mg Oral Q4H PRN Woodward Ku, MD   2 mg at 10/27/15 1134   ??? traZODone (DESYREL) tablet 50 mg  50 mg Oral QHS PRN Woodward Ku, MD  50 mg at 10/26/15 2155   ??? 0.9% sodium chloride infusion 250 mL  250 mL IntraVENous PRN Loreli Dollar, MD       ??? cloNIDine HCl (CATAPRES) tablet 0.1 mg  0.1 mg Oral Q2H PRN Woodward Ku, MD       ??? 0.9% sodium chloride infusion 250 mL  250 mL IntraVENous PRN Loreli Dollar, MD       ??? hydrALAZINE (APRESOLINE) 20 mg/mL injection 10 mg  10 mg IntraVENous Q6H PRN Woodward Ku, MD       ??? diphenhydrAMINE (BENADRYL) capsule 25 mg  25 mg Oral Q6H PRN Betsy Pries, MD   25 mg at 10/27/15 1134   ??? ammonium lactate (LAC-HYDRIN) 12 % lotion   Topical BID Betsy Pries, MD       ??? sodium chloride (NS) flush 5-10 mL  5-10 mL IntraVENous PRN Alben Deeds, CRNA       ??? naloxone (NARCAN) injection 0.1 mg  0.1 mg IntraVENous PRN Alben Deeds, CRNA       ??? glucose chewable tablet 16 g  4 Tab Oral PRN Alben Deeds, CRNA       ??? glucagon (GLUCAGEN) injection 1 mg  1 mg IntraMUSCular PRN Alben Deeds, CRNA       ??? dextrose (D50W) injection syrg 12.5-25 g  25-50 mL IntraVENous PRN Alben Deeds, CRNA        ??? sodium citrate 4 gram /100 mL (4 %) 0.08 g  2 mL Does Not Apply DIALYSIS PRN Loreli Dollar, MD       ??? acetaminophen (TYLENOL) tablet 500 mg  500 mg Oral Q6H PRN Betsy Pries, MD       ??? oxyCODONE-acetaminophen (PERCOCET 7.5) 7.5-325 mg per tablet 1 Tab  1 Tab Oral Q6H PRN Edilia Bo, MD   1 Tab at 10/25/15 0206   ??? calcium acetate (PHOSLO) capsule 667 mg  1 Cap Oral TID WITH MEALS Wanda Plump, MD   Stopped at 10/27/15 0955   ??? folic acid (FOLVITE) tablet 1 mg  1 mg Oral DAILY Wanda Plump, MD   1 mg at 10/27/15 0955   ??? hydroxyurea (HYDREA) chemo cap 500 mg  500 mg Oral DAILY Wanda Plump, MD   500 mg at 10/27/15 0954   ??? sodium citrate 4 gram /100 mL (4 %) 0.08 g  2 mL Hemodialysis DIALYSIS TUE, THU & SAT Loreli Dollar, MD   Stopped at 10/17/15 1000   ??? ondansetron (ZOFRAN) injection 4 mg  4 mg IntraVENous Q6H PRN Ouida Sills, MD   4 mg at 10/21/15 4098       Impression:    ESRD: Tolerating HD , BP fluctuating, .  Anemia of CKD: on Epogen.  Hyperparathyroidism Yes  Hyperkalemia  Plan  Dialysis for volume and solute management  Epogen  10000 units.  Hectorol : 1 microgram.  2 units of PRBC  As planned.  Will Dialyze him again tomorrow.         Loreli Dollar, MD

## 2015-10-27 NOTE — Brief Op Note (Signed)
BRIEF OPERATIVE NOTE    Date of Procedure: 10/27/2015   Preoperative Diagnosis: ESRD (end stage renal disease) (HCC) [N18.6]  Postoperative Diagnosis: ESRD (end stage renal disease) (HCC) [N18.6]    Procedure(s):  PLACEMENT OF TEMPORARY DIALYSIS CATHETER  Surgeon(s) and Role:     * Oneita HurtMarc A Issaiah Seabrooks, MD - Primary            Surgical Staff:  Circ-1: Melina ModenaSouzanna J Raiszadeh, RN  Scrub Tech-1: Denzil Hughesomia F Whitfield  Surg Asst-1: Alexia FreestoneSara R Soenksen  Event Time In   Incision Start 1516   Incision Close      Anesthesia: MAC   Estimated Blood Loss: <350mL  Specimens: * No specimens in log *   Findings: none   Complications: given hyperkalemia and anemia felt too dangerous to perform HeRO graft at this time so placed temp cath for dialysis and will attempt Friday.  Implants: * No implants in log *

## 2015-10-27 NOTE — Progress Notes (Addendum)
Hospitalist Progress Note    Patient: Tyler Ballard MRN: [id number]  CSN: [id number]    Date of Birth: 09-14-1981  Age: 34 y.o.  Sex: male    DOA: 10/16/2015 LOS:  LOS: 11 days          Pt seen in CVT OR. Pt currently intubated and sedated.   Per OR team, pt had versed and fentanyl for procedure. post procedure pt BP dropped and then became bradycardic and then asystole so ACLS protocol started with CPR and epi given, with in 30-40 seconds pt back to NSR and BP improved.   Pt was intubated in OR.    Pt had multiple BM in OR.   Pt stayed in OR for long time since pt did not have ICU bed.     Assessment/Plan     1. Brief cardiac arrest possible due to Hyperkalemia: s/p resuscitation. Stat EKG, CE.   2. Hyperkalemia: s/p kayexalate, will give ca gluconate, bicarb, D50 and insulin. Stat EKG. ACE and bactrim on hold   3. ESRD / HD per nephrology. D/w renal team, stat HD now, no signs of fluid overload.    4. HTN: BP lower side, will hold HTN med's  5. HIV/AIDS: asymptomatic, no infections: ID in put noted. Hold bactrim due to # 9.  6. Non-functioning HD access - s/p HD catheter X 3 placement by vascular and all had break and cant use them, he had 3 bleeding episodes and RRT's. vas sx couldn't open up clotted graft. s/p new cath but cant use now due to leak. Vas Sx couldn't do venogram today   7. Pancytopenia in the setting of HIV, no need for oncology eval   8. Acute blood loss Anemia: will 3 units with HD, H/H still low, will transfuse 2 units    9. ? Sickle cell anemia: had neg Hb electrophoresis in 2014. Pending repeat Hb electrophoresis.   10. Obesity   Pt doesn't have IV access, IR couldn't do peripheral or central line due to all veins are thrombosed.     D/w renal team and ICU Dr. Allena KatzPatel  D/w guard at bedside   D/w RN  D/w Vas Sx    5.25 pm  Pt AA, follows commands well.  Says wants to get off vent.    I spent 40 minutes with the patient in face-to-face consultation, of which  greater than 50% was spent in counseling and coordination of care as described above    Case discussed with:  Patient  Family  Nursing  Case Management  DVT Prophylaxis:  Lovenox  Hep SQ  SCDs  Coumadin   On Heparin gtt    Vital signs/Intake and Output:  Visit Vitals   ??? BP 137/87 (BP 1 Location: Right arm, BP Patient Position: At rest)   ??? Pulse 82   ??? Temp 98 ??F (36.7 ??C)   ??? Resp 18   ??? Ht 5\' 7"  (1.702 m)   ??? Wt 105.9 kg (233 lb 7.5 oz)   ??? SpO2 97%   ??? BMI 36.57 kg/m2       Gen: sedated   HEENT: PERRLA  RRR  RS: cta b.l  Soft nt nd nabs.  R groin HD cath noted   L chest HD cath site dressing clean, no bleeding.   Neuro: sedated.     Medications Reviewed      Labs: Results:       Chemistry Recent Labs      10/27/15  1308  10/27/15   0553  10/26/15   0903   GLU  91  100*  88   NA  135*  135*  136   K  6.5*  6.1*  6.0*   CL  100  102  101   CO2  BUN  73*  75*  65*   CREA  8.73*  8.62*  7.82*   CA  7.5*  7.1*  7.6*   AGAP  BUCR  8*  9*  8*      CBC w/Diff Recent Labs      10/27/15   0553  10/26/15   0903  10/25/15   0830   WBC  2.0*  2.0*  1.5*   RBC  2.38*  2.36*  2.32*   HGB  7.0*  6.9*  6.8*   HCT  22.0*  22.1*  21.8*   PLT  77*  72*  69*   GRANS  49  48  60   LYMPH  34  34  26   EOS  6*  10*  4      Cardiac Enzymes No results for input(s): CPK, CKND1, MYO in the last 72 hours.    No lab exists for component: CKRMB, TROIP   Coagulation Recent Labs      10/27/15   0553  10/26/15   0903   PTP  15.1  14.8   INR  1.2  1.2       Lipid Panel Lab Results   Component Value Date/Time    CHOLESTEROL, TOTAL 85 11/29/2012 09:36 AM    HDL CHOLESTEROL 33 11/29/2012 09:36 AM    LDL, CALCULATED 41.2 11/29/2012 09:36 AM    VLDL, CALCULATED 10.8 11/29/2012 09:36 AM    TRIGLYCERIDE 54 11/29/2012 09:36 AM    CHOL/HDL RATIO 2.6 11/29/2012 09:36 AM      BNP No results for input(s): BNPP in the last 72 hours.   Liver Enzymes No results for input(s): TP, ALB, TBIL, AP, SGOT, GPT in the last 72 hours.     No lab exists for component: DBIL   Thyroid Studies No results found for: T4, T3U, TSH, TSHEXT, TSHEXT     Procedures/imaging: see electronic medical records for all procedures/Xrays and details which were not copied into this note but were reviewed prior to creation of Plan.-

## 2015-10-27 NOTE — Progress Notes (Signed)
Amp of epinephrine given IV, chest compressions continue

## 2015-10-27 NOTE — Anesthesia Pre-Procedure Evaluation (Signed)
Anesthetic History               Review of Systems / Medical History  Patient summary reviewed    Pulmonary  Within defined limits                 Neuro/Psych       CVA (Right hemiparesis)       Cardiovascular    Hypertension              Exercise tolerance: >4 METS     GI/Hepatic/Renal         Renal disease: ESRD       Endo/Other        Obesity     Other Findings              Physical Exam    Airway  Mallampati: III  TM Distance: 4 - 6 cm  Neck ROM: normal range of motion   Mouth opening: Normal     Cardiovascular  Regular rate and rhythm,  S1 and S2 normal,  no murmur, click, rub, or gallop             Dental  No notable dental hx       Pulmonary  Breath sounds clear to auscultation               Abdominal  GI exam deferred       Other Findings            Anesthetic Plan    ASA: 4  Anesthesia type: general          Induction: Intravenous  Anesthetic plan and risks discussed with: Patient

## 2015-10-27 NOTE — Progress Notes (Signed)
Noted pt`s k level was 6.1 this am. Notified Dr Waneta MartinsSplan. Pt is NPO for new dialysis cath. Had a huge formed BM this am. Also glu level=86. In no acute distress.

## 2015-10-27 NOTE — Progress Notes (Signed)
Surgeon and anesthesiologist called back to room

## 2015-10-27 NOTE — Progress Notes (Signed)
Hourly rounding complete.   Safety  Pt resting   [ x ]  On stretcher with side rails up and bed in locked position, call bell within reach  [  ]  Sitting in chair with casters locked, call bell within reach    Toileting  [ x ] pt denies need to use bathroom  [  ] pt assisted to bathroom  [  ] pt assisted with bedpan  [  ] pt independent to bathroom as needed    Ongoing Plan of Care  Plan of care and expected time for test and results reviewed with pt.    Pain Management / Comfort  [x ] dimmed lights  [  ] warm blanket provided  [ x ] pain assessed  [  ] monitor alarms reviewed

## 2015-10-27 NOTE — Progress Notes (Signed)
Awaiting for Hero catheter , once done will dialyze & transfuse.

## 2015-10-27 NOTE — Progress Notes (Signed)
Chest compressions begun, pt intubated by anesthesia.

## 2015-10-28 ENCOUNTER — Inpatient Hospital Stay: Admit: 2015-10-28 | Payer: PRIVATE HEALTH INSURANCE | Primary: Family Medicine

## 2015-10-28 LAB — TYPE AND SCREEN
ABO/Rh: B POS
Antibody Screen: NEGATIVE
Antigen/Antibody: NEGATIVE
Antigen/Antibody: NEGATIVE
Antigen/Antibody: NEGATIVE
Status: TRANSFUSED
Status: TRANSFUSED
Unit Divison: 0
Unit Divison: 0
Unit Divison: 0

## 2015-10-28 LAB — CBC WITH AUTOMATED DIFF
ABS. BASOPHILS: 0 10*3/uL (ref 0.0–0.1)
ABS. EOSINOPHILS: 0.1 10*3/uL (ref 0.0–0.4)
ABS. LYMPHOCYTES: 0.6 10*3/uL — ABNORMAL LOW (ref 0.9–3.6)
ABS. MONOCYTES: 0.2 10*3/uL (ref 0.05–1.2)
ABS. NEUTROPHILS: 1.3 10*3/uL — ABNORMAL LOW (ref 1.8–8.0)
BASOPHILS: 1 % (ref 0–2)
EOSINOPHILS: 3 % (ref 0–5)
HCT: 27.4 % — ABNORMAL LOW (ref 36.0–48.0)
HGB: 9.3 g/dL — ABNORMAL LOW (ref 13.0–16.0)
LYMPHOCYTES: 26 % (ref 21–52)
MCH: 30 PG (ref 24.0–34.0)
MCHC: 33.9 g/dL (ref 31.0–37.0)
MCV: 88.4 FL (ref 74.0–97.0)
MONOCYTES: 9 % (ref 3–10)
MPV: 10.9 FL (ref 9.2–11.8)
NEUTROPHILS: 61 % (ref 40–73)
PLATELET: 94 10*3/uL — ABNORMAL LOW (ref 135–420)
RBC: 3.1 M/uL — ABNORMAL LOW (ref 4.70–5.50)
RDW: 18.2 % — ABNORMAL HIGH (ref 11.6–14.5)
WBC: 2.2 10*3/uL — ABNORMAL LOW (ref 4.6–13.2)

## 2015-10-28 LAB — BLOOD GAS, ARTERIAL POC
Base excess (POC): 0 mmol/L
Base excess (POC): 3 mmol/L
FIO2 (POC): 35 %
FIO2 (POC): 30 %
HCO3 (POC): 22.6 MMOL/L (ref 22–26)
HCO3 (POC): 26.2 MMOL/L — ABNORMAL HIGH (ref 22–26)
PEEP/CPAP (POC): 5 cmH2O
PEEP/CPAP (POC): 5 cmH2O
Pressure support: 7 cmH2O
Set Rate: 14 {beats}/min
Tidal volume: 500 ml
Total resp. rate: 20
Total resp. rate: 22
pCO2 (POC): 24.9 MMHG — ABNORMAL LOW (ref 35.0–45.0)
pCO2 (POC): 35.8 MMHG (ref 35.0–45.0)
pH (POC): 7.566 — CR (ref 7.35–7.45)
pH (POC): 7.471 — ABNORMAL HIGH (ref 7.35–7.45)
pO2 (POC): 69 MMHG — ABNORMAL LOW (ref 80–100)
pO2 (POC): 72 MMHG — ABNORMAL LOW (ref 80–100)
sO2 (POC): 96 % (ref 92–97)
sO2 (POC): 95 % (ref 92–97)

## 2015-10-28 LAB — CARDIAC PANEL,(CK, CKMB & TROPONIN)
CK - MB: 1.5 ng/ml (ref 0.5–3.6)
CK - MB: 0.8 ng/ml (ref 0.5–3.6)
CK-MB Index: 1.7 % (ref 0.0–4.0)
CK-MB Index: 1 % (ref 0.0–4.0)
CK: 90 U/L (ref 39–308)
CK: 81 U/L (ref 39–308)
Troponin-I, QT: 0.21 NG/ML — ABNORMAL HIGH (ref 0.0–0.045)
Troponin-I, QT: 0.12 NG/ML — ABNORMAL HIGH (ref 0.0–0.045)

## 2015-10-28 LAB — TYPE & SCREEN
ABO/Rh(D): B POS
ANTIGEN/ANTIBODY INFO: NEGATIVE
ANTIGEN/ANTIBODY INFO: NEGATIVE
ANTIGEN/ANTIBODY INFO: NEGATIVE
Antibody screen: NEGATIVE
Status of unit: TRANSFUSED
Status of unit: TRANSFUSED
Unit division: 0
Unit division: 0
Unit division: 0

## 2015-10-28 LAB — GLUCOSE, POC
Glucose (POC): 64 mg/dL — ABNORMAL LOW (ref 70–110)
Glucose (POC): 111 mg/dL — ABNORMAL HIGH (ref 70–110)
Glucose (POC): 79 mg/dL (ref 70–110)
Glucose (POC): 102 mg/dL (ref 70–110)
Glucose (POC): 90 mg/dL (ref 70–110)

## 2015-10-28 LAB — METABOLIC PANEL, BASIC
Anion gap: 10 mmol/L (ref 3.0–18)
BUN/Creatinine ratio: 8 — ABNORMAL LOW (ref 12–20)
BUN: 50 MG/DL — ABNORMAL HIGH (ref 7.0–18)
CO2: 23 mmol/L (ref 21–32)
Calcium: 7.6 MG/DL — ABNORMAL LOW (ref 8.5–10.1)
Chloride: 101 mmol/L (ref 100–108)
Creatinine: 5.98 MG/DL — ABNORMAL HIGH (ref 0.6–1.3)
GFR est AA: 13 mL/min/{1.73_m2} — ABNORMAL LOW (ref >60)
GFR est non-AA: 11 mL/min/{1.73_m2} — ABNORMAL LOW (ref >60)
Glucose: 98 mg/dL (ref 74–99)
Potassium: 4.3 mmol/L (ref 3.5–5.5)
Sodium: 134 mmol/L — ABNORMAL LOW (ref 136–145)

## 2015-10-28 LAB — TSH 3RD GENERATION: TSH: 3.63 u[IU]/mL (ref 0.36–3.74)

## 2015-10-28 LAB — PROTHROMBIN TIME + INR
INR: 1.3 — ABNORMAL HIGH (ref 0.8–1.2)
Prothrombin time: 15.3 s — ABNORMAL HIGH (ref 11.5–15.2)

## 2015-10-28 LAB — PHOSPHORUS: Phosphorus: 3.5 MG/DL (ref 2.5–4.9)

## 2015-10-28 LAB — MAGNESIUM: Magnesium: 2.1 mg/dL (ref 1.8–2.4)

## 2015-10-28 MED ORDER — PHENOL 1.4 % MUCOSAL AEROSOL SPRAY
1.4 % | Status: DC | PRN
Start: 2015-10-28 — End: 2015-11-03

## 2015-10-28 MED ORDER — IPRATROPIUM-ALBUTEROL 2.5 MG-0.5 MG/3 ML NEB SOLUTION
2.5 mg-0.5 mg/3 ml | Freq: Four times a day (QID) | RESPIRATORY_TRACT | Status: DC | PRN
Start: 2015-10-28 — End: 2015-11-03

## 2015-10-28 MED ORDER — HYDROMORPHONE (PF) 1 MG/ML IJ SOLN
1 mg/mL | INTRAMUSCULAR | Status: AC | PRN
Start: 2015-10-28 — End: 2015-10-29
  Administered 2015-10-28 – 2015-10-29 (×6): via INTRAVENOUS

## 2015-10-28 MED ORDER — NICOTINE 14 MG/24 HR DAILY PATCH
14 mg/24 hr | Freq: Every day | TRANSDERMAL | Status: DC
Start: 2015-10-28 — End: 2015-11-03

## 2015-10-28 MED ORDER — DEXTROSE 10% IN WATER (D10W) IV
10 % | INTRAVENOUS | Status: DC
Start: 2015-10-28 — End: 2015-11-01
  Administered 2015-10-28 – 2015-11-01 (×6): via INTRAVENOUS

## 2015-10-28 MED ORDER — SODIUM CHLORIDE 0.9 % IV
100 mcg/mL | INTRAVENOUS | Status: AC
Start: 2015-10-28 — End: 2015-10-28
  Administered 2015-10-28 (×3): via INTRAVENOUS

## 2015-10-28 MED ORDER — OXYCODONE-ACETAMINOPHEN 7.5 MG-325 MG TAB
Freq: Four times a day (QID) | ORAL | Status: DC | PRN
Start: 2015-10-28 — End: 2015-11-01
  Administered 2015-10-29 – 2015-11-01 (×9): via ORAL

## 2015-10-28 MED ORDER — DOXERCALCIFEROL 4 MCG/2 ML IV SOLN
4 mcg/2 mL | INTRAVENOUS | Status: DC
Start: 2015-10-28 — End: 2015-10-30
  Administered 2015-10-28: 18:00:00 via INTRAVENOUS

## 2015-10-28 MED FILL — DIPHENHYDRAMINE 25 MG CAP: 25 mg | ORAL | Qty: 1

## 2015-10-28 MED FILL — HYDROMORPHONE (PF) 1 MG/ML IJ SOLN: 1 mg/mL | INTRAMUSCULAR | Qty: 1

## 2015-10-28 MED FILL — PROPOFOL 10 MG/ML IV EMUL: 10 mg/mL | INTRAVENOUS | Qty: 100

## 2015-10-28 MED FILL — DEXTROSE 50% IN WATER (D50W) IV SYRG: INTRAVENOUS | Qty: 50

## 2015-10-28 MED FILL — CALCIUM ACETATE 667 MG CAP: 667 mg | ORAL | Qty: 1

## 2015-10-28 MED FILL — FAMOTIDINE (PF) 20 MG/2 ML IV: 20 mg/2 mL | INTRAVENOUS | Qty: 2

## 2015-10-28 MED FILL — DEXTROSE 10% IN WATER (D10W) IV: 10 % | INTRAVENOUS | Qty: 1000

## 2015-10-28 MED FILL — CHLORHEXIDINE GLUCONATE 0.12 % MOUTHWASH: 0.12 % | Qty: 15

## 2015-10-28 MED FILL — IPRATROPIUM-ALBUTEROL 2.5 MG-0.5 MG/3 ML NEB SOLUTION: 2.5 mg-0.5 mg/3 ml | RESPIRATORY_TRACT | Qty: 3

## 2015-10-28 MED FILL — HECTOROL 4 MCG/2 ML INTRAVENOUS SOLUTION: 4 mcg/2 mL | INTRAVENOUS | Qty: 2

## 2015-10-28 MED FILL — PROMETHAZINE 25 MG TAB: 25 mg | ORAL | Qty: 1

## 2015-10-28 MED FILL — ONDANSETRON (PF) 4 MG/2 ML INJECTION: 4 mg/2 mL | INTRAMUSCULAR | Qty: 2

## 2015-10-28 MED FILL — OXYCODONE-ACETAMINOPHEN 7.5 MG-325 MG TAB: ORAL | Qty: 1

## 2015-10-28 MED FILL — FOLIC ACID 1 MG TAB: 1 mg | ORAL | Qty: 1

## 2015-10-28 MED FILL — PROCRIT 10,000 UNIT/ML INJECTION SOLUTION: 10000 unit/mL | INTRAMUSCULAR | Qty: 1

## 2015-10-28 MED FILL — SODIUM CITRATE 4 GRAM/100 ML (4 %) SOLUTION: 4 gram /100 mL ( %) | Qty: 2

## 2015-10-28 MED FILL — PHENOL 1.4 % MUCOSAL AEROSOL SPRAY: 1.4 % | Qty: 177

## 2015-10-28 MED FILL — HYDROXYUREA 500 MG CAPSULE: 500 mg | ORAL | Qty: 1

## 2015-10-28 MED FILL — AMMONIUM LACTATE 12 % LOTION: 12 % | CUTANEOUS | Qty: 225

## 2015-10-28 MED FILL — PRECEDEX 100 MCG/ML INTRAVENOUS SOLUTION: 100 mcg/mL | INTRAVENOUS | Qty: 4

## 2015-10-28 NOTE — Progress Notes (Signed)
SBT initiated, PS 7, PEEP +5. FiO2 30%. Patient alert, following commands.   Pt is tachypneic, vT's low, RSBI > 105.      10/28/15 1511   Weaning Parameters   Spontaneous Breathing Trial Complete Yes   Resp Rate Observed 35   Ve 6.5   VT 73   RSBI 187     Will monitor closely for improved SBT tolerance.

## 2015-10-28 NOTE — Progress Notes (Signed)
Pt dialyzing  Remains intubated currently but may perhaps be extubated later today  It was recognized post arrest in OR yesterday that he had a right arm bruit and it appears a fistula is present  Will plan to do a fistulogram before further discussion of HeRO graft  If stable can do tomorrow in cath lab with dr Lorine Bearscamacho  Will follow up for recommendations pending his status today

## 2015-10-28 NOTE — Other (Signed)
CHANGE OF SHIFT REPORT GIVEN TO LETTY  MACARAEG RN USING SBAR MAR KARDEX BY CHERYL PATERNITE RN

## 2015-10-28 NOTE — Progress Notes (Addendum)
[facility]  ICU Progress Note      Name: Tyler Ballard   DOB: 07-03-1981   MRN: [id number]   Date: 10/28/2015 10:56 AM     I have reviewed the flowsheet and previous day???s notes.  Events overnight reviewed and discussed with nursing staff. Vital signs and records reviewed.      Subjective: Tyler Ballard has been seen and evaluated in ICU. Patient is a 34 y.o. male with PMHx significant for HIV/AIDS, HTN, ESRD. [facility] jail after having a non-functioning dialysis catheter that needed to be replaced and electrolyte dysfunction. His hospital course was complicated by 3 broken dialysis catheters d/t pt tampering. He went to the OR today to have a temoporary dialysis catheter placed, procedure was uneventful but just after the procedure was finished, his HR began to brady down and he became pulseless. CPR was started with very brief round of compressions and ROSC was obtained after 1 dose epi given. He was transferred to ICU for higher level of care.   ??  The patient is critically ill and can not provide additional history due to Ventilated. ??  History taken from Dr Sherlynn Stalls, CRNA, EMR    10/28/2015  Patient with periods of hypoglycemia overnight, started on D10 with NS started at 25 ml/hr increased to 50 ml/hr. Last blood sugar at 79 at 0500 hours this morning.   HD done yesterday. Potassium improved to 4.3 from 6.5 yesterday.   Remains pancytopenic. Patient with hx of untreated HIV.   Prelim blood cultures negative.   Respiratory cultures pending.   Elevated troponin initially at 0.24 at 0300 hours, however downward trending at 0.12 at 1030 hours this morning.   Urine culture not performed due to no urine sample. Pt with hx of anuria.   Noted that patient has a +bruit and thrill to his right upper extremity.    The patient is unable to give any meaningful history or review of systems because the patient is:  Intubated Sedated   Unresponsive       The patient is critically ill on      Mechanical ventilation Pressors   BiPAP                  JXB:JYNWGN of systems not obtained due to patient factors.   Patent intubated.    Medication Review:  ?? Pressors - None   ?? Sedation - Weaning off Propofol, on Precedex  ?? Antibiotics - None   ?? Pain - Dilaudid, Percocet PRN   ?? GI/ DVT - Pepcid (GI), SCD (DVT)  ?? Others (other gtts)    Safety Bundles: VAP Bundle/ CAUTI/ Severe Sepsis Protocol/ Electrolyte Replacement Protocol    Vital Signs:    Visit Vitals   ??? BP (!) 116/31   ??? Pulse 76   ??? Temp 98.1 ??F (36.7 ??C)   ??? Resp 14   ??? Ht 5\' 7"  (1.702 m)   ??? Wt 101.6 kg (223 lb 15.8 oz)   ??? SpO2 100%   ??? BMI 35.08 kg/m2       O2 Device: Ventilator, Endotracheal tube   O2 Flow Rate (L/min): 2 l/min   Temp (24hrs), Avg:98.1 ??F (36.7 ??C), Min:97.1 ??F (36.2 ??C), Max:99.3 ??F (37.4 ??C)       Intake/Output:   Last shift:         Last 3 shifts: 11/07 1901 - 11/09 0700  In: 410 [P.O.:360; I.V.:50]  Out: 1001 [Urine:1]  Intake/Output Summary (Last 24 hours) at 10/28/15 1111  Last data filed at 10/28/15 1025   Gross per 24 hour   Intake     50 ml   Output   1000 ml   Net   -950 ml       Ventilator Settings:  Ventilator  Mode: Assist control, VC+  Respiratory Rate  Back-Up Rate: 14  Insp Time (sec): 1 sec  I:E Ratio: 1:3  Ventilator Volumes  Vt Set (ml): 400 ml  Vt Exhaled (Machine Breath) (ml): 423 ml  Ve Observed (l/min): 8.24 l/min  Ventilator Pressures  PIP Observed (cm H2O): 22 cm H2O  Plateau Pressure (cm H2O): 23 cm H2O  MAP (cm H2O): 11  PEEP/VENT (cm H2O): 5 cm H20    Physical Exam:    General: Intubated/sedated  HEENT:  Anicteric sclerae; pink palpebral conjunctivae; mucosa moist  Resp:  Symmetrical chest expansion, no accessory muscle use; good airway entry; no rales/ wheezing/ rhonchi noted  CV:  S1, S2 present; regular rate and rhythm, HD cath to left chest wall and right groin. Both sites are clean, dry, intact with no increased  warmth, erythema, swelling, or drainage present.   Patient with right upper AV fistula with +bruit and palpable thrill  GI:  Abdomen soft, non-tender; (+) active bowel sounds  Extremities:  +2 pulses on all extremities; no edema/ cyanosis/ clubbing noted  Skin:  Warm; no rashes/ lesions noted  Neurologic: Limited due to patient sedation. Pupils reactive bilaterally. Gag reflex present.     Devices:    ETT tube - 10/27/15   OG tube - 10/27/15      DATA:     Current Facility-Administered Medications   Medication Dose Route Frequency   ??? sodium chloride 0.9 % in dextrose 10% 1,040 mL infusion   IntraVENous CONTINUOUS   ??? dexmedetomidine (PRECEDEX) 400 mcg in 0.9% sodium chloride 100 mL infusion  0.2-0.7 mcg/kg/hr IntraVENous TITRATE   ??? sodium chloride (NS) flush 5-10 mL  5-10 mL IntraVENous Q8H   ??? sodium chloride (NS) flush 5-10 mL  5-10 mL IntraVENous PRN   ??? chlorhexidine (PERIDEX) 0.12 % mouthwash 10 mL  10 mL Oral Q12H   ??? albuterol-ipratropium (DUO-NEB) 2.5 MG-0.5 MG/3 ML  3 mL Nebulization Q4H RT   ??? propofol (DIPRIVAN) infusion  5-50 mcg/kg/min IntraVENous TITRATE   ??? midazolam (VERSED) injection 1 mg  1 mg IntraVENous Q2H PRN   ??? insulin lispro (HUMALOG) injection   SubCUTAneous Q6H   ??? dextrose (D50W) injection syrg 12.5-25 g  25-50 mL IntraVENous PRN   ??? epoetin alfa (EPOGEN;PROCRIT) injection 10,000 Units  10,000 Units IntraVENous Q MON, WED & FRI   ??? doxercalciferol (HECTOROL) 4 mcg/2 mL injection 1 mcg  1 mcg IntraVENous Q MON, WED & FRI   ??? famotidine (PF) (PEPCID) 20 mg in sodium chloride 0.9 % 10 mL injection  20 mg IntraVENous Q24H   ??? promethazine (PHENERGAN) suppository 25 mg  25 mg Rectal Q8H PRN   ??? HYDROmorphone (PF) (DILAUDID) injection 1 mg  1 mg IntraMUSCular Q4H PRN   ??? heparin (porcine) 100 unit/mL injection 300 Units  300 Units InterCATHeter PRN   ??? 0.9% sodium chloride infusion 250 mL  250 mL IntraVENous PRN   ??? promethazine (PHENERGAN) tablet 25 mg  25 mg Oral Q6H PRN    ??? HYDROmorphone (DILAUDID) tablet 2 mg  2 mg Oral Q4H PRN   ??? traZODone (DESYREL) tablet 50 mg  50 mg Oral QHS  PRN   ??? 0.9% sodium chloride infusion 250 mL  250 mL IntraVENous PRN   ??? cloNIDine HCl (CATAPRES) tablet 0.1 mg  0.1 mg Oral Q2H PRN   ??? 0.9% sodium chloride infusion 250 mL  250 mL IntraVENous PRN   ??? hydrALAZINE (APRESOLINE) 20 mg/mL injection 10 mg  10 mg IntraVENous Q6H PRN   ??? diphenhydrAMINE (BENADRYL) capsule 25 mg  25 mg Oral Q6H PRN   ??? ammonium lactate (LAC-HYDRIN) 12 % lotion   Topical BID   ??? sodium chloride (NS) flush 5-10 mL  5-10 mL IntraVENous PRN   ??? naloxone (NARCAN) injection 0.1 mg  0.1 mg IntraVENous PRN   ??? glucose chewable tablet 16 g  4 Tab Oral PRN   ??? glucagon (GLUCAGEN) injection 1 mg  1 mg IntraMUSCular PRN   ??? sodium citrate 4 gram /100 mL (4 %) 0.08 g  2 mL Does Not Apply DIALYSIS PRN   ??? acetaminophen (TYLENOL) tablet 500 mg  500 mg Oral Q6H PRN   ??? oxyCODONE-acetaminophen (PERCOCET 7.5) 7.5-325 mg per tablet 1 Tab  1 Tab Oral Q6H PRN   ??? calcium acetate (PHOSLO) capsule 667 mg  1 Cap Oral TID WITH MEALS   ??? folic acid (FOLVITE) tablet 1 mg  1 mg Oral DAILY   ??? hydroxyurea (HYDREA) chemo cap 500 mg  500 mg Oral DAILY   ??? sodium citrate 4 gram /100 mL (4 %) 0.08 g  2 mL Hemodialysis DIALYSIS TUE, THU & SAT   ??? ondansetron (ZOFRAN) injection 4 mg  4 mg IntraVENous Q6H PRN         Labs: Results:       Chemistry Recent Labs      10/28/15   0310  10/27/15   0937  10/27/15   0553   GLU  98  91  100*   NA  134*  135*  135*   K  4.3  6.5*  6.1*   CL  101  100  102   CO2  BUN  50*  73*  75*   CREA  5.98*  8.73*  8.62*   CA  7.6*  7.5*  7.1*   AGAP  BUCR  8*  8*  9*      CBC w/Diff Recent Labs      10/28/15   0310  10/27/15   0553  10/26/15   0903   WBC  2.2*  2.0*  2.0*   RBC  3.10*  2.38*  2.36*   HGB  9.3*  7.0*  6.9*   HCT  27.4*  22.0*  22.1*   PLT  94*  77*  72*   GRANS  61  49  48   LYMPH  26  34  34   EOS  3  6*  10*      Coagulation Recent Labs       10/28/15   0310  10/27/15   0553   PTP  15.3*  15.1   INR  1.3*  1.2       Liver Enzymes No results for input(s): TP, ALB, TBIL, AP, SGOT, GPT in the last 72 hours.    No lab exists for component: DBIL   ABG Lab Results   Component Value Date/Time    PHI 7.566 (HH) 10/28/2015 05:04 AM    PCO2I 24.9 (L) 10/28/2015 05:04 AM  PO2I 69 (L) 10/28/2015 05:04 AM    HCO3I 22.6 10/28/2015 05:04 AM    FIO2I 35 10/28/2015 05:04 AM      Microbiology Recent Labs      10/28/15   0306  10/27/15   0254   CULT  NO GROWTH AFTER 2 HOURS  NO GROWTH AFTER 2 HOURS          Telemetry: Sinus A-flutter Paced    A-fib Multiple PVC???s                    Imaging:  I have personally reviewed the patient???s radiographs  Radiographs reviewed with radiologist   No change from prior, tubes and lines in adequate position  Improved   Worsening    CXR Results  (Last 48 hours)               10/27/15 1741  XR CHEST PORT Final result    Impression:  Impression:        1. Endotracheal tube tip projects at the level of the carina, direct it towards   the right mainstem bronchus.   -Subsequent chest x-ray of 10/28/2015 demonstrates retraction of the endotracheal   tube with appropriate positioning.       2. Stable enlargement of the cardiac silhouette and pulmonary vascular   congestion. Possible small bilateral pleural effusions.           Narrative:  AP CHEST, PORTABLE       INDICATION: Status post intubation       COMPARISON: Prior chest x-rays, most recent 10/16/2015. Subsequent chest x-ray   10/28/2015       FINDINGS:  EKG leads overlie the patient. Interval intubation, distal tip   projects at the level of the carina, direct it towards the right mainstem   bronchus. Dual lumen central venous catheter is in stable position. Bilateral   axillary/left brachial stents are again noted.       Stable enlargement of the cardiac silhouette.  Mild prominence of the   bronchovascular markings, greatest at the lung bases. Possible trace bilateral    pleural effusions as evidenced by blunting of the costophrenic sulci. No   pneumothorax      No acute osseous abnormalities are identified.                 IMPRESSION:   ?? PEA cardiac arrest s/p femoral temporary dialysis cath placement, likely d/t hyperkalemia  ?? Acute respiratory failure requiring mechanical ventilation 2' to above  ?? ESRD on HD ??  ?? Non-functioning HD catheters, has had 3 catheters placed throughout his hospital stay and all have become broken d/t patient tampering  ?? Electrolyte derangement  ?? HIV/AIDS with pancytopenia, not receiving treatment at this time  ?? Hx HTN  ?? Hx AVN of R hip s/p replacement  ?? Hx malingering   ?? Code status: full??        PLAN:   ?? CVS: Continue monitoring of HD. BP stable in 130s systolic, not tachycardic. Troponin elevated at 0.21 at 0300 hours but trending downwards with most recent at 0.12 at 1030 hours. Continue to monitor troponin level for continued downward trend. ECHO reviewed. Hold home BP meds. Patient to receive HD again today.   ?? Respiratory: Patient with pH 7.566, 24.9 pCO2, 69 pO2, normal HCO3 at 22.6. Currently on assist control. Ventilator protocol, HOB >30, daily ABG and CXR. Plan to do SBT and if patient tolerates will plan for extubation after  HD today.   ?? ID: Blood cultures with no growth, respiratory cultures pending. No urine cultures as patient is anuric. Hold empiric abx, no active s/sx of sepsis at this time and doubt that events are related to sepsis. Recent diagnosis of HIV and has not followed up with ID, not on HAART. ID consulted.   ?? Hematology/Oncology: Pancytopenia in unchanged from baseline.  Pt reports a hx of sickle cell but electrophoresis is negative. Continue to monitor CBS.   ?? Renal: HD in process. Continue to monitor electrolytes. Check BMP.   ?? GI/GU: NG tube in place, pepcid for GI prophylaxis as below.   ?? Endocrine:  SSI and accuchecks q6.   ?? Neurology/Pain/Sedation:  Precedex for sedation, weaned off propofol    ?? Prophylaxis: GI Prophylaxis with pepcid. DVT Prophylaxis with SCDs.  ?? Discussed in interdisciplinary rounds          The patient is:  acutely ill Risk of deterioration:  moderate     critically ill   high     See orders for details    My assessment/plan was discussed with:  Nursing PT/OT    Respiratory therapy Dr. Zollie PeeAmit Patel    Family      Total critical care time exclusive of procedures 35  minutes    Rollen SoxLynne M Shadiamond Koska, PA-C Fellow   10/28/2015

## 2015-10-28 NOTE — Anesthesia Post-Procedure Evaluation (Signed)
Post-Anesthesia Evaluation and Assessment    Patient: Tyler Ballard MRN: [id number]  SSN: [id number]    Date of Birth: 24-Sep-1981  Age: 34 y.o.  Sex: male       Cardiovascular Function/Vital Signs  Visit Vitals   ??? BP 135/45   ??? Pulse 70   ??? Temp 36.5 ??C (97.7 ??F)   ??? Resp 19   ??? Ht 5\' 7"  (1.702 m)   ??? Wt 101.6 kg (223 lb 15.8 oz)   ??? SpO2 100%   ??? BMI 35.08 kg/m2       Patient is status post general anesthesia for Procedure(s):  PLACEMENT OF TEMPORARY DIALYSIS CATHETER.    Nausea/Vomiting: None    Postoperative hydration reviewed and adequate.    Pain:  Pain Scale 1: Adult Nonverbal Pain Scale (10/28/15 1200)  Pain Intensity 1: 0 (10/28/15 1200)   Managed    Neurological Status:   Neuro (WDL): Within Defined Limits (10/21/15 1901)  Neuro  Neurologic State: Eyes do not open to any stimulus;Responds to noxious stimuli only (10/27/15 2000)  Orientation Level: Disoriented X4 (10/27/15 2000)  Cognition: Impaired decision making;No command following (10/27/15 2000)  Speech: Intubated (10/27/15 2000)  Assessment L Pupil: Round;Sluggish (10/27/15 2000)  Size L Pupil (mm): 3 (10/27/15 2000)  Assessment R Pupil: Round;Sluggish (10/27/15 2000)  Size R Pupil (mm): 3 (10/27/15 2000)  LUE Motor Response: Purposeful (10/27/15 2000)  LLE Motor Response: No movement to any stimulus;Nonpurposeful (10/27/15 2000)  RUE Motor Response: Nonpurposeful;Weak (10/27/15 2000)  RLE Motor Response: No movement to any stimulus (10/27/15 2000)   Patient Intubated and Sedated    Mental Status and Level of Consciousness: Sedated    Pulmonary Status:   O2 Device: Ventilator;Endotracheal tube (10/28/15 1152)   Adequate oxygenation and airway patent    Complications related to anesthesia: None    Post-anesthesia assessment completed. No concerns    Signed By: Belinda FisherStacy L Ibn Stief, CRNA     October 28, 2015

## 2015-10-28 NOTE — Op Note (Signed)
[facility]  OPERATIVE REPORTS    Name:  Tyler Ballard, Tyler Ballard  MR#:  [id number]  DOB:  Jan 22, 1981  Account #:  [id number]  Date of Adm:  10/16/2015  Date of Surgery:  10/27/2015      PREOPERATIVE DIAGNOSIS: End-stage renal disease in need of  dialysis access.    POSTOPERATIVE DIAGNOSIS: End-stage renal disease in need of  dialysis access.    PROCEDURES PERFORMED: Ultrasound-guided access of right  common femoral vein with placement of Mahurkar 24 cm temporary  dialysis catheter.    ESTIMATED BLOOD LOSS: Less than 50 mL.    CULTURES: None.    SPECIMENS REMOVED: None.    ANESTHESIA:    DRAINS: None.    INDICATIONS FOR THE PROCEDURE: The patient is a 34 year old  gentleman with end-stage renal disease in need of dialysis access.  The patient was originally planned to place a temporary dialysis  catheter with Hero graft placement. Unfortunately, the patient is  hyperkalemic, as well as anemic. It was decided to go ahead and place  a temporary dialysis catheter, and it was deemed too dangerous to  perform the Hero graft at this current time. Will reschedule for Friday.  The patient was understanding of all of this, and given the risks and  benefits of the procedure, including but not limited to bleeding,  infection, damage to adjacent structures, MI, stroke, and death, as well  as DVT. The patient was understanding of all the risks and underwent  the procedure.    DESCRIPTION OF PROCEDURE: The patient was correctly identified  in the preoperative holding area and taken to the operating room in  stable condition. The patient had a pre-incision time-out prior to any  incision. The patient was prepped and draped in normal sterile fashion. Ultrasound was then used to visualize the right  common femoral vein and then we were able to take a single wall entry   needle and gain access into the common femoral vein. Once the  needle tip was identified within the vein and there was positive blood   return, a wire was then able to be placed. We then made a small skin  incision using a #11 blade and we dilated the tract x2 and then placed  a 24 cm Mahurkar catheter in place. We then were able to remove the  wire, flush all 3 ports easily as well as aspirate all 3 ports easily. We  then were able to secure the catheter with 2-0 nylon sutures at both  butterfly holes. Biopatch and Tegaderm were then placed and then all  ports were capped appropriately. The patient was then taken care of  and slowly awakened from his anesthesia.        Margaretmary DysMARC Hesston Hitchens, MD    EC / RS  D:  10/27/2015   15:38  T:  10/28/2015   04:09  Job #:  [id number]

## 2015-10-28 NOTE — Progress Notes (Signed)
Chaplain attended the interdisciplinary rounds for Tyler Ballard, who is a 33 y.o.,male. Patient???s Primary Language is: English.   According to the patient???s EMR Religious Affiliation is: No religion.     The reason the Patient came to the hospital is:   Patient Active Problem List    Diagnosis Date Noted   ??? Secondary hyperparathyroidism of renal origin (HCC) 10/18/2015   ??? HIV (human immunodeficiency virus infection) (HCC) 10/17/2015   ??? ESRD (end stage renal disease) (HCC) 10/17/2015   ??? Renal failure 10/16/2015   ??? ESRD on dialysis (HCC) 11/25/2013   ??? Clotted dialysis access (HCC) 11/03/2013   ??? Dialysis patient (HCC) 11/03/2013   ??? Secondary hyperparathyroidism (HCC) 11/03/2013   ??? Positive blood culture 11/02/2013   ??? Pulmonary edema 09/06/2013   ??? Drug-seeking behavior 09/06/2013   ??? Noncompliance of patient with renal dialysis (HCC) 09/06/2013   ??? Pancytopenia (HCC) 09/05/2013   ??? Nausea & vomiting 09/04/2013   ??? Malingering 08/06/2013   ??? Do not give narcotics 08/06/2013   ??? ARF (acute renal failure) (HCC) 08/02/2013   ??? Sickle cell crisis (HCC) 05/26/2013     Class: Acute   ??? Arthralgia 03/29/2013   ??? Persistent vomiting 03/29/2013   ??? ESRD (end stage renal disease) on dialysis (HCC) 07/06/2012   ??? HTN (hypertension) 07/06/2012   ??? Sickle cell anemia with pain (HCC) 07/05/2012      Plan:  Chaplains will continue to follow and will provide pastoral care on an as needed/requested basis.  Chaplain recommends bedside caregivers page chaplain on duty if patient shows signs of acute spiritual or emotional distress.    Chaplain Kerry Pokines   Board Certified Chaplain   Spiritual Care   [phone]

## 2015-10-28 NOTE — Other (Signed)
Bedside, Verbal, Recorded and Written shift change report given to Romie Jumperheryl Paternite,RN (oncoming nurse) by Amil AmenLeticia G Macaraeg, RN   (offgoing nurse). Report included the following information SBAR, Kardex, Intake/Output, MAR, Recent Results, Med Rec Status, Cardiac Rhythm NSR and Alarm Parameters .

## 2015-10-28 NOTE — Progress Notes (Signed)
Hospitalist Progress Note    Patient: Tyler Ballard MRN: [id number]  CSN: [id number]    Date of Birth: August 05, 1981  Age: 34 y.o.  Sex: male    DOA: 10/16/2015 LOS:  LOS: 12 days          Pt AA, still intubated, getting HD.  Pt aware about R arm fistula but says its not working.      Assessment/Plan     1. Brief cardiac arrest possible due to Hyperkalemia: s/p resuscitation.    2. Hyperkalemia: s/p kayexalate, better with HD   3. ESRD / HD per nephrology.  HD per renal team.    4. Acute resp failure: s/p intubation in OR, plan for extubation per ICU team  5. HIV/AIDS: asymptomatic, no infections: ID on case. Hold bactrim due to # 2. Can resume once HD cath issue is resolved.   6. Non-functioning HD access - s/p HD catheter X 3 placement by vascular and all had break and cant use them, he had 3 bleeding episodes and RRT's. vas sx couldn't open up clotted graft. Pt totally had 4 cath leak and bleeding this hospitalization. Vas looking into R arm fistula with US.    7. Pancytopenia in the setting of HIV.   8. Acute blood loss Anemia due to bleeding from broken  HD cath: s/p 5 units transfused     9. ? Sickle cell anemia: had neg Hb electrophoresis in 2014. Pending repeat Hb electrophoresis.   10. HTN: BP stable, off HTN med's   Pt doesn't have IV access, IR couldn't do peripheral or central line due to all veins are thrombosed.     D/w guard at bedside   D/w RN  D/w Timmothy EulerVas Sx      Case discussed with:  Patient  Family  Nursing  Case Management  DVT Prophylaxis:  Lovenox  Hep SQ  SCDs  Coumadin   On Heparin gtt    Vital signs/Intake and Output:  Visit Vitals   ??? BP 135/45   ??? Pulse 70   ??? Temp 97.7 ??F (36.5 ??C)   ??? Resp 19   ??? Ht 5\' 7"  (1.702 m)   ??? Wt 101.6 kg (223 lb 15.8 oz)   ??? SpO2 100%   ??? BMI 35.08 kg/m2       Gen: AA  HEENT: PERRLA  RRR  RS: cta b.l  Soft nt nd nabs.  R groin HD cath noted   L chest HD cath site dressing clean, no bleeding.   Neuro: sedated.     Medications Reviewed      Labs: Results:        Chemistry Recent Labs      10/28/15   0310  10/27/15   0937  10/27/15   0553   GLU  98  91  100*   NA  134*  135*  135*   K  4.3  6.5*  6.1*   CL  101  100  102   CO2  23  26  26    BUN  50*  73*  75*   CREA  5.98*  8.73*  8.62*   CA  7.6*  7.5*  7.1*   AGAP  10  9  7    BUCR  8*  8*  9*      CBC w/Diff Recent Labs      10/28/15   0310  10/27/15   0553  10/26/15   0903   WBC  2.2*  2.0*  2.0*   RBC  3.10*  2.38*  2.36*   HGB  9.3*  7.0*  6.9*   HCT  27.4*  22.0*  22.1*   PLT  94*  77*  72*   GRANS  61  49  48   LYMPH  26  34  34   EOS  3  6*  10*      Cardiac Enzymes Recent Labs      10/28/15   1030  10/28/15   0310   CPK  81  90   CKND1  1.0  1.7      Coagulation Recent Labs      10/28/15   0310  10/27/15   0553   PTP  15.3*  15.1   INR  1.3*  1.2       Lipid Panel Lab Results   Component Value Date/Time    CHOLESTEROL, TOTAL 85 11/29/2012 09:36 AM    HDL CHOLESTEROL 33 11/29/2012 09:36 AM    LDL, CALCULATED 41.2 11/29/2012 09:36 AM    VLDL, CALCULATED 10.8 11/29/2012 09:36 AM    TRIGLYCERIDE 54 11/29/2012 09:36 AM    CHOL/HDL RATIO 2.6 11/29/2012 09:36 AM      BNP No results for input(s): BNPP in the last 72 hours.   Liver Enzymes No results for input(s): TP, ALB, TBIL, AP, SGOT, GPT in the last 72 hours.    No lab exists for component: DBIL   Thyroid Studies Lab Results   Component Value Date/Time    TSH 3.63 10/28/2015 03:10 AM        Procedures/imaging: see electronic medical records for all procedures/Xrays and details which were not copied into this note but were reviewed prior to creation of Plan.-

## 2015-10-28 NOTE — Other (Signed)
Hypoglycemic at 64. Tyler Ballard ,PA-C made aware and received new orders. D10 with NS mixed by Pharmacy started at 3125ml/Hr. BG 79 this am aBOVE pac MADE AWARE AND d10 RATE INCREASED TO 50ML/hR.

## 2015-10-28 NOTE — Progress Notes (Signed)
RENAL PROGRESS NOTE: Pt seen on dialysis.  Follow up of ESRD     Present on Admission:  ??? ESRD (end stage renal disease) on dialysis [facility])  ??? HTN (hypertension)  ??? Sickle cell anemia with pain (HCC)  ??? Secondary hyperparathyroidism of renal origin (HCC)         Subjective: Remains intubated, little restless, on Proflol drip.    Patient is on Dialysis.     Objective:    Patient Vitals for the past 12 hrs:   Temp Pulse Resp BP SpO2   10/28/15 1152 - - - - 100 %   10/28/15 1130 - 83 13 134/56 100 %   10/28/15 1100 - 84 (!) 0 134/48 100 %   10/28/15 1045 - 76 (!) 0 125/51 100 %   10/28/15 1030 - 81 14 128/59 100 %   10/28/15 1025 - 76 14 (!) 116/31 100 %   10/28/15 1000 - 84 (!) 0 (!) 123/37 100 %   10/28/15 0930 - 77 24 - 100 %   10/28/15 0926 - - - - 100 %   10/28/15 0900 - 76 (!) 0 115/51 100 %   10/28/15 0816 98.1 ??F (36.7 ??C) - - - -   10/28/15 0800 - 81 (!) 0 145/51 100 %   10/28/15 0700 - 83 (!) 0 127/78 100 %   10/28/15 0600 - 91 (!) 0 141/41 100 %   10/28/15 0500 - 71 (!) 0 138/45 100 %   10/28/15 0434 - 80 21 - 100 %   10/28/15 0400 - 76 (!) 0 148/53 100 %   10/28/15 0353 98.4 ??F (36.9 ??C) - - - -   10/28/15 0300 - 70 (!) 0 154/85 100 %   10/28/15 0200 - 71 (!) 0 154/40 98 %   10/28/15 0100 - 73 (!) 0 154/77 99 %   10/28/15 0029 - 77 20 - 100 %   10/28/15 0000 97.8 ??F (36.6 ??C) 72 (!) 0 151/70 100 %        Intake/Output Summary (Last 24 hours) at 10/28/15 1157  Last data filed at 10/28/15 1025   Gross per 24 hour   Intake     50 ml   Output   1000 ml   Net   -950 ml       Physical Assessment:     General Appearance: NAD  Lung: clear to auscultation  Heart: regular rate and rhythm and no murmurs, clicks or gallops  Lower Extremities: no  edema   Access: (R) Groin qb 350, no leak.  Also found old AVF on hid right upper arm, has good thrill & Bruit.    Labs    CBC w/Diff    Recent Labs      10/28/15   0310  10/27/15   0553  10/26/15   0903   WBC  2.2*  2.0*  2.0*   RBC  3.10*  2.38*  2.36*    HGB  9.3*  7.0*  6.9*   HCT  27.4*  22.0*  22.1*   MCV  88.4  92.4  93.6   MCH  30.0  29.4  29.2   MCHC  33.9  31.8  31.2   RDW  18.2*  19.0*  19.3*    Recent Labs      10/28/15   0310  10/27/15   0553  10/26/15   0903   MONOS  9  10  8  EOS  3  6*  10*   BASOS  1  1  0   RDW  18.2*  19.0*  19.3*        Comprehensive Metabolic Profile    Recent Labs      10/28/15   0310  10/27/15   0937  10/27/15   0553   NA  134*  135*  135*   K  4.3  6.5*  6.1*   CL  101  100  102   CO2  BUN  50*  73*  75*   CREA  5.98*  8.73*  8.62*    Recent Labs      10/28/15   0310  10/27/15   0937  10/27/15   0553   CA  7.6*  7.5*  7.1*   PHOS  3.5   --   5.6*          Basic Metabolic Profile       results  reviwed.          MEDS:Reviwed.  Current Facility-Administered Medications   Medication Dose Route Frequency Provider Last Rate Last Dose   ??? sodium chloride 0.9 % in dextrose 10% 1,040 mL infusion   IntraVENous CONTINUOUS January-Jill Ogoy V, PA-C 25 mL/hr at 10/28/15 0125     ??? dexmedetomidine (PRECEDEX) 400 mcg in 0.9% sodium chloride 100 mL infusion  0.2-0.7 mcg/kg/hr IntraVENous TITRATE Terrance Mass, MD       ??? sodium chloride (NS) flush 5-10 mL  5-10 mL IntraVENous Q8H Miquel Dunn, CRNA   10 mL at 10/27/15 2156   ??? sodium chloride (NS) flush 5-10 mL  5-10 mL IntraVENous PRN Miquel Dunn, CRNA       ??? chlorhexidine (PERIDEX) 0.12 % mouthwash 10 mL  10 mL Oral Q12H Miranda Tamok, PA   10 mL at 10/28/15 0953   ??? albuterol-ipratropium (DUO-NEB) 2.5 MG-0.5 MG/3 ML  3 mL Nebulization Q4H RT Miranda Tamok, PA   3 mL at 10/28/15 1152   ??? propofol (DIPRIVAN) infusion  5-50 mcg/kg/min IntraVENous TITRATE Miranda Tamok, PA 25.4 mL/hr at 10/28/15 0948 40 mcg/kg/min at 10/28/15 0948   ??? midazolam (VERSED) injection 1 mg  1 mg IntraVENous Q2H PRN Miranda Tamok, PA       ??? insulin lispro (HUMALOG) injection   SubCUTAneous Q6H Miranda Tamok, PA   Stopped at 10/28/15 0000    ??? dextrose (D50W) injection syrg 12.5-25 g  25-50 mL IntraVENous PRN Miranda Tamok, PA   25 g at 10/28/15 0013   ??? epoetin alfa (EPOGEN;PROCRIT) injection 10,000 Units  10,000 Units IntraVENous Q MON, WED & FRI Loreli Dollar, MD       ??? doxercalciferol (HECTOROL) 4 mcg/2 mL injection 1 mcg  1 mcg IntraVENous Q MON, WED & Serafina Royals, MD   1 mcg at 10/27/15 1923   ??? famotidine (PF) (PEPCID) 20 mg in sodium chloride 0.9 % 10 mL injection  20 mg IntraVENous Q24H Miranda Tamok, PA   20 mg at 10/27/15 2153   ??? promethazine (PHENERGAN) suppository 25 mg  25 mg Rectal Q8H PRN Thomas P. Splan, MD       ??? HYDROmorphone (PF) (DILAUDID) injection 1 mg  1 mg IntraMUSCular Q4H PRN Thomas P. Splan, MD   1 mg at 10/27/15 1134   ??? heparin (porcine) 100 unit/mL injection 300 Units  300 Units InterCATHeter PRN Chanda Busing, MD       ???  0.9% sodium chloride infusion 250 mL  250 mL IntraVENous PRN Loreli DollarMosta Janasha Barkalow, MD       ??? promethazine (PHENERGAN) tablet 25 mg  25 mg Oral Q6H PRN Woodward KuBasavalinga S Talur-Matadha, MD   25 mg at 10/27/15 1134   ??? HYDROmorphone (DILAUDID) tablet 2 mg  2 mg Oral Q4H PRN Woodward KuBasavalinga S Talur-Matadha, MD   2 mg at 10/27/15 1134   ??? traZODone (DESYREL) tablet 50 mg  50 mg Oral QHS PRN Woodward KuBasavalinga S Talur-Matadha, MD   50 mg at 10/26/15 2155   ??? 0.9% sodium chloride infusion 250 mL  250 mL IntraVENous PRN Loreli DollarMosta Allessandra Bernardi, MD       ??? cloNIDine HCl (CATAPRES) tablet 0.1 mg  0.1 mg Oral Q2H PRN Woodward KuBasavalinga S Talur-Matadha, MD       ??? 0.9% sodium chloride infusion 250 mL  250 mL IntraVENous PRN Loreli DollarMosta Suzetta Timko, MD       ??? hydrALAZINE (APRESOLINE) 20 mg/mL injection 10 mg  10 mg IntraVENous Q6H PRN Woodward KuBasavalinga S Talur-Matadha, MD       ??? diphenhydrAMINE (BENADRYL) capsule 25 mg  25 mg Oral Q6H PRN Betsy PriesEmily E Schley, MD   25 mg at 10/27/15 1134   ??? ammonium lactate (LAC-HYDRIN) 12 % lotion   Topical BID Betsy PriesEmily E Schley, MD       ??? sodium chloride (NS) flush 5-10 mL  5-10 mL IntraVENous PRN Alben DeedsEbony S McNeal, CRNA        ??? naloxone (NARCAN) injection 0.1 mg  0.1 mg IntraVENous PRN Alben DeedsEbony S McNeal, CRNA       ??? glucose chewable tablet 16 g  4 Tab Oral PRN Alben DeedsEbony S McNeal, CRNA       ??? glucagon (GLUCAGEN) injection 1 mg  1 mg IntraMUSCular PRN Alben DeedsEbony S McNeal, CRNA       ??? sodium citrate 4 gram /100 mL (4 %) 0.08 g  2 mL Does Not Apply DIALYSIS PRN Loreli DollarMosta Kerissa Coia, MD       ??? acetaminophen (TYLENOL) tablet 500 mg  500 mg Oral Q6H PRN Betsy PriesEmily E Schley, MD       ??? oxyCODONE-acetaminophen (PERCOCET 7.5) 7.5-325 mg per tablet 1 Tab  1 Tab Oral Q6H PRN Edilia BoKabaye Berhanu, MD   1 Tab at 10/25/15 0206   ??? calcium acetate (PHOSLO) capsule 667 mg  1 Cap Oral TID WITH MEALS Wanda Plumpirghayu Desai, MD   Stopped at 10/28/15 1200   ??? folic acid (FOLVITE) tablet 1 mg  1 mg Oral DAILY Wanda Plumpirghayu Desai, MD   1 mg at 10/28/15 [id number]   ??? hydroxyurea (HYDREA) chemo cap 500 mg  500 mg Oral DAILY Wanda Plumpirghayu Desai, MD   500 mg at 10/28/15 0953   ??? sodium citrate 4 gram /100 mL (4 %) 0.08 g  2 mL Hemodialysis DIALYSIS TUE, THU & SAT Loreli DollarMosta Artur Winningham, MD   0.08 g at 10/27/15 2122   ??? ondansetron (ZOFRAN) injection 4 mg  4 mg IntraVENous Q6H PRN Ouida SillsJason L Andre, MD   4 mg at 10/21/15 [id number]       Impression:    ESRD: Tolerating HD well.  Anemia of CKD: on  Epogen.Received PRBC transfusion.  Hyperparathyroidism Yes  Hypekalemia  Plan  Dialysis for volume and solute management  Epogen  10000 units.  Hectorol: 1 microgram.  Resolved Hyperkalemia.  Will keep on HD schedule q Mon, Wed & Friday.  Send for Duplex study of Right arm AVF to see if it is functional or not.  Inda Merlin, MD

## 2015-10-28 NOTE — Other (Signed)
ACUTE HEMODIALYSIS FLOW SHEET    HEMODIALYSIS ORDERS: Physician: Ardelle Park   Dialyzer: revaclear        Duration: 3.5hr  BFR: 350  DFR: 800   Dialysate:  Temp 35.5cK+  2   Ca+  3Na 140icarb35   Weight:  104.2 kg    Bed Scale      Unable to Obtain       Dry weight/UF Goal:2500 Access catheter  Needle Gauge   Heparin   Bolus      Units     Hourly       Units    None     Catheter locking solution sodium citrate   Pre BP:   128/53   Pulse:    88    Temperature:  97.5  Respirations: 14 Tx: NS     250 ml/Bolus  Other         N/A   Labs: Pre        Post:         N/A   Additional Orders(medications, blood products, hypotension management):        N/A      Time Out/Safety Check   [facility] Consent Verified     CATHETER ACCESS: N/A   Right   Left   IJ     Fem    First use X-ray verified     Tunnel                 Non Tunneled   No S/S infection  Redness  Drainage Cultured Swelling Pain   Medical Aseptic Prep Utilized   Dressing Changed   Biopatch  Date:       Clotted   Patent   Flows: Good  Poor  Reversed   If access problem, Dr. notified: Yes    N/A  Date:           GRAFT/FISTULA ACCESS:  N/A     Right     Left     UE     LE   AVG   AVF        Buttonhole    Medical Aseptic Prep Utilized   No S/S infection  Redness  Drainage Cultured Swelling Pain    Bruit:    Strong     Weak       Thrill :    Strong     Weak       Needle Gauge:   Length:    If access problem, Dr. notified: Yes     N/A  Date:        Please describe access if present and not used:       GENERAL ASSESSMENT:   LUNGS:  Rate 14aO2%         N/A     Clear   Coarse   Crackles   Wheezing         Diminished     Location : RLL   LLL    RUL  LUL   Cough: Productive  Dry  N/A   Respirations:  Easy  Labored   Therapy:  RA  NC  l/min    Mask: NRB Venti       O2%                  Ventilator  Intubated   Trach   BiPaP   CARDIAC: Regular       Irregular    Pericardial Rub   JVD  Monitored   Bedside   Remotely monitored  N/A  Rhythm:     EDEMA:  None  Generalized   Pitting  1     2     3     4                   Facial   Pedal    UE   LE   SKIN:    Warm   Hot      Cold    Dry      Pale    Diaphoretic                   Flushed   Jaundiced   Cyanotic   Rash   Weeping   LOC:     Alert      Oriented:     Person      Place  Time                Confused   Lethargic   Medicated   Non-responsive     GI / ABDOMEN:   Flat     Distended     Soft     Firm     Obese                              Diarrhea   Bowel Sounds   Nausea   Vomiting      GU / URINE ASSESSME* Voiding    Oliguria   Anuria     Foley      Incontinent      Incontinent Brief        Bathroom Privileges     PAIN:  0 1  2   3   4   5   6   7   8   9   10             Scale 0-10  Action/Follow Up:    MOBILITY:  Amb     Amb/Assist     Bed     Wheelchair   Stretcher      All Vitals and Treatment Details on Attached [facility]: [facility]          Room # ZOX09      1st Time Acute   Stat   Routine   Urgent      Acute Room    Bedside   ICU/CCU   ER   Isolation Precautions:   Dialysis    Airborne    Contact     Reverse   Special Considerations:          Blood Consent Verified N/A     ALLERGIES:    NKA          Code Status:   Full Code   DNR   Other           HBsAg ONLY: Date Drawn         Negative Positive Unknown   HBsAb: Date     Susceptible    Immune10 Not Drawn   Drawn     Current Labs:    Date of Labs:          Today         Cut and paste current labs here.  Results for Tyler Ballard, Tyler Ballard (MRN [id number]) as of 10/29/2015 02:38   Ref. Range 10/28/2015  03:10   Sodium Latest Ref Range: 136 - 145 mmol/L 134 (L)   Potassium Latest Ref Range: 3.5 - 5.5 mmol/L 4.3   Chloride Latest Ref Range: 100 - 108 mmol/L 101   CO2 Latest Ref Range: 21 - 32 mmol/L 23   Anion gap Latest Ref Range: 3.0 - 18 mmol/L 10   Glucose Latest Ref Range: 74 - 99 mg/dL 98   BUN Latest Ref Range: 7.0 - 18 MG/DL 50 (H)   Creatinine Latest Ref Range: 0.6 - 1.3 MG/DL 5.955.98 (H)    BUN/Creatinine ratio Latest Ref Range: 12 - 20   8 (L)   Calcium Latest Ref Range: 8.5 - 10.1 MG/DL 7.6 (L)   Phosphorus Latest Ref Range: 2.5 - 4.9 MG/DL 3.5   Magnesium Latest Ref Range: 1.8 - 2.4 mg/dL 2.1   GFR est non-AA Latest Ref Range: >60 ml/min/1.7473m2 11 (L)   GFR est AA Latest Ref Range: >60 ml/min/1.3573m2 13 (L)   CK Latest Ref Range: 39 - 308 U/L 90   CK-MB Index Latest Ref Range: 0.0 - 4.0 % 1.7   CK - MB Latest Ref Range: 0.5 - 3.6 ng/ml 1.5   Troponin-I, Qt. Latest Ref Range: 0.0 - 0.045 NG/ML 0.21 (H)   Results for Tyler Ballard, Tyler Ballard (MRN [id number]) as of 10/29/2015 02:38   Ref. Range 10/28/2015 03:10   WBC Latest Ref Range: 4.6 - 13.2 K/uL 2.2 (L)   RBC Latest Ref Range: 4.70 - 5.50 M/uL 3.10 (L)   HGB Latest Ref Range: 13.0 - 16.0 g/dL 9.3 (L)   HCT Latest Ref Range: 36.0 - 48.0 % 27.4 (L)   MCV Latest Ref Range: 74.0 - 97.0 FL 88.4   MCH Latest Ref Range: 24.0 - 34.0 PG 30.0   MCHC Latest Ref Range: 31.0 - 37.0 g/dL 29.533.9   RDW Latest Ref Range: 11.6 - 14.5 % 18.2 (H)   PLATELET Latest Ref Range: 135 - 420 K/uL 94 (L)   MPV Latest Ref Range: 9.2 - 11.8 FL 10.9   NEUTROPHILS Latest Ref Range: 40 - 73 % 61   LYMPHOCYTES Latest Ref Range: 21 - 52 % 26   MONOCYTES Latest Ref Range: 3 - 10 % 9   EOSINOPHILS Latest Ref Range: 0 - 5 % 3   BASOPHILS Latest Ref Range: 0 - 2 % 1   DF Latest Units:   AUTOMATED   ABS. NEUTROPHILS Latest Ref Range: 1.8 - 8.0 K/UL 1.3 (L)   ABS. LYMPHOCYTES Latest Ref Range: 0.9 - 3.6 K/UL 0.6 (L)   ABS. MONOCYTES Latest Ref Range: 0.05 - 1.2 K/UL 0.2   ABS. EOSINOPHILS Latest Ref Range: 0.0 - 0.4 K/UL 0.1   ABS. BASOPHILS Latest Ref Range: 0.0 - 0.1 K/UL 0.0   Results for Tyler Ballard, Tyler Ballard (MRN [id number]) as of 10/29/2015 02:38   Ref. Range 10/17/2015 15:05   Hepatitis B surface Ag Latest Ref Range: <1.00 Index <0.10   Hep B surface Ag Interp. Latest Ref Range: NEG   NEGATIVE   Hepatitis B surface Ab Latest Ref Range: >10.0 mIU/mL 5.54 (L)    Hep B surface Ab Interp. Latest Ref Range: POS   NEGATIVE (A)   Hep B surface Ab comment Latest Units:   Samples with a  v.Marland Kitchen..Marland Kitchen  DI   Renal     Other      NPO       Diabetic      PRIMARY NURSE REPORT: First initial/Last name/Title  Ballard Paternite RNPre Dialysis:   Time:10:00      EDUCATION:     Patient  Other         Knowledge Basis: None Minimal  Substantial   Barriers to learning N/A    Access Care      S&S of infection      Fluid Management     K+     Procedural    Albumin      Medications      Tx Options      Transplant      Diet      Other   Teaching Tools:   Explain   Demo   Handouts  Video  Patient response:    Verbalized understanding   Teach back   Return demonstration  Requires follow up   Inappropriate due to            RO/HEMODIALYSIS MACHINE SAFETY CHECKS ??? Before each treatment:     Machine Number:                   [facility]                                   Unit Machine #  with centralized RO                                   Portable Machine #1/RO serial # [id number]                                   Portable Machine #2/RO serial # [id number]                                   Portable Machine #3/RO serial # [id number]                                                                                                       [facility]                                   Portable Machine #11/RO serial # [id number]                                    Portable Machine #12/RO serial # [id number]  Portable Machine #13/RO serial #  [id number]      Alarm Test:  Pass time 1015       Other:          RO/Machine Log Complete      Temp     97.7     Extracorporeal Circuit Tested for integrity   Dialysate: pH  7.4 Conductivity: Meter   14.0     HD Machine   14.1                TCD: 13.9  Dialyzer Lot # [id number]         Blood Tubing Lot # [id number]        Saline  Lot #  [id number]     CHLORINE TESTING-Before each treatment and every 4 hours    Total Chlorine  less than 0.1 ppm  Time:  4 Hr/2nd Check Time:   (if greater than 0.1 ppm from Primary then every 30 minutes from Secondary)     TREATMENT INITIATION ??? with Dialysis Precautions:    All Connections Secured                  Saline Line Double Clamped    Venous Parameters Set                   Arterial Parameters Set     Prime Given                Air Foam Detector Engaged      Treatment Initiation Note Patient intubated, monitor with right femoral catheter in place and patent.     Medication Dose Volume Route Initials Dialyzer Cleared:  Good  Fair   Poor    Blood processed:  ML  UF Removed  3000 Ml    Post Wt:     kg  POst BP:         Pulse:      Respirations: 16Temperature: 98.2                                   Post Tx Vascular Access: AVF/AVG: Bleeding stopped Art min. Ven.  Min   N/                                   Catheter: Locking solution: sodium citrate 1:1000 Art. 2 Ven.  N/a                                 Post Assessment:                                    Skin:   Warm   Dry  Diaphoretic     Flushed   Pale  Cyanotic   [facility] Signatures  Jeffrey poulliot  Initials   RN [phone] Lungs:      Clear     Course   Crackles   Wheezing  Diminished           Cardiac:  Regular    Irregular    Monitor   N/A  Rhythm:           Edema:  None     General  Facial    Pedal     UE     LE       Pain:0  Tolerated treatment well, vitals stable  Post Treatmenment     POST TREATMENT PRIMARY NURSE HANDOFF REPORT:     First initial/Last name/Title   Ballard Paternite RN    Post Dialysis:  Time:  1415     Abbreviations: AVG-arterial venous graft, AVF-arterial venous fistula, IJ-Internal Jugular, Subcl-Subclavian, Fem-Femoral, Tx-treatment, AP/HR-apical heart rate, DFR-dialysate flow rate, BFR-blood flow rate, AP-arterial pressure, VP-venous pressure, UF-ultrafiltrate,  TMP-transmembrane pressure, Ven-Venous, Art-Arterial, RO-Reverse Osmosis                        ACUTE HEMODIALYSIS FLOW SHEET    HEMODIALYSIS ORDERS: Physician: Hoque     Dialyzer: revaclear    Duration: 3.5 hr  BFR: 350   DFR: 800   Dialysate:  Temp 35 K+   2   Ca+ *3Na 140 Bicarb 35   Weight:   kg    Bed Scale      Unable to Obtain       Dry weight/UF Goal: 2500 Access right femoral catheter  Needle Gauge  79   Heparin   Bolus      Units     Hourly       Units    None     Catheter locking solution    Pre BP:   145/ 79Pulse:    76    Temperature:   98.2  Respirations: 14 Tx: NS     ml/Bolus  Other         N/A   Labs: Pre        Post:         N/A   Additional Orders(medications, blood products, hypotension management):        N/A      Time Out/Safety Check   [facility] Consent Verified     CATHETER ACCESSN/A   Right   Left   IJ     Fem    First use X-ray verified     Tunnel                 Non Tunneled   No S/S infection  Redness  Drainage Cultured Swelling Pain   Medical Aseptic Prep Utilized   Dressing Changed   Biopatch  Date:       Clotted   Patent   Flows: Good  Poor  Reversed   If access problem, Dr. notified: Yes    N/A  Date:           GRAFT/FISTULA ACCESS:  N/A     Right     Left     UE     LE   AVG   AVF        Buttonhole    Medical Aseptic Prep Utilized   No S/S infection  Redness  Drainage Cultured Swelling Pain    Bruit:    Strong     Weak       Thrill :    Strong     Weak       Needle Gauge:   Length:    If access problem, Dr. notified: Yes     N/A  Date:  Please describe access if present and not used:right upper AVF       GENERAL ASSESSMENT:    LUNGS:  Rate 14 SaO2%        N/A     Clear   Coarse   Crackles   Wheezing         Diminished     Location : RLL   LLL    RUL  LUL   Cough: Productive  Dry  N/A   Respirations:  Easy  Labored   Therapy:  RA  NC /min    Mask: NRB Venti       O2%                  Ventilator  Intubated   Trach   BiPaP   CARDIAC: Regular       Irregular    Pericardial Rub   JVD           Monitored   Bedside   Remotely monitored  N/A  Rhythm:    EDEMA:  None  Generalized   Pitting  1     2     3     4                   Facial   Pedal    UE   LE   SKIN:    Warm   Hot      Cold    Dry      Pale    Diaphoretic                   Flushed   Jaundiced   Cyanotic   Rash   Weeping   LOC:     Alert      Oriented:     Person      Place  Time                Confused   Lethargic   Medicated   Non-responsive     GI / ABDOMEN:   Flat     Distended     Soft     Firm     Obese                              Diarrhea   Bowel Sounds   Nausea   Vomiting  GU / URINE ASSESSMENT Voiding    Oliguria   Anuria     Foley      Incontinent      Incontinent Brief        Bathroom Privileges     PAIN:  0 1  2   3   4   5   6   7   8   9   10             Scale 0-10  Action/Follow Up:    MOBILITY:   Amb     Amb/Assist     Bed     Wheelchair   Stretcher      All Vitals and Treatment Details on Attached [facility]: [facility]       Room # AVW09      1st Time Acute   Stat   Routine   Urgent      Acute Room    Bedside   ICU/CCU   ER   Isolation Precautions:  Dialysis    Airborne    Contact     Reverse   Special Considerations:          Blood Consent Verified N/A     ALLERGIES:    NKA          Code Status:   Full Code   DNR   Other           HBsAg ONLY: Date Drawn         Negative Positive Unknown   HBsAb: Date      Susceptible    Immune10 Not Drawn   Drawn     Current Labs:    Date of Labs:           Today         Cut and paste current labs here.    Results for Tyler Ballard, Tyler Ballard (MRN [id number]) as of 10/29/2015 00:30   Ref. Range 10/28/2015 03:10   WBC Latest Ref Range: 4.6 - 13.2 K/uL 2.2 (L)   RBC Latest Ref Range: 4.70 - 5.50 M/uL 3.10 (L)   HGB Latest Ref Range: 13.0 - 16.0 g/dL 9.3 (L)   HCT Latest Ref Range: 36.0 - 48.0 % 27.4 (L)   MCV Latest Ref Range: 74.0 - 97.0 FL 88.4   MCH Latest Ref Range: 24.0 - 34.0 PG 30.0   MCHC Latest Ref Range: 31.0 - 37.0 g/dL 40.9   RDW Latest Ref Range: 11.6 - 14.5 % 18.2 (H)    PLATELET Latest Ref Range: 135 - 420 K/uL 94 (L)   MPV Latest Ref Range: 9.2 - 11.8 FL 10.9   NEUTROPHILS Latest Ref Range: 40 - 73 % 61   LYMPHOCYTES Latest Ref Range: 21 - 52 % 26   MONOCYTES Latest Ref Range: 3 - 10 % 9   EOSINOPHILS Latest Ref Range: 0 - 5 % 3   BASOPHILS Latest Ref Range: 0 - 2 % 1   DF Latest Units:   AUTOMATED   ABS. NEUTROPHILS Latest Ref Range: 1.8 - 8.0 K/UL 1.3 (L)   ABS. LYMPHOCYTES Latest Ref Range: 0.9 - 3.6 K/UL 0.6 (L)   ABS. MONOCYTES Latest Ref Range: 0.05 - 1.2 K/UL 0.2   ABS. EOSINOPHILS Latest Ref Range: 0.0 - 0.4 K/UL 0.1   ABS. BASOPHILS Latest Ref Range: 0.0 - 0.1 K/UL 0.0   Results for Tyler Ballard, Tyler Ballard (MRN [id number]) as of 10/29/2015 00:30   Ref. Range 10/28/2015 03:10   Sodium Latest Ref Range: 136 - 145 mmol/L 134 (L)   Potassium Latest Ref Range: 3.5 - 5.5 mmol/L 4.3   Chloride Latest Ref Range: 100 - 108 mmol/L 101   CO2 Latest Ref Range: 21 - 32 mmol/L 23   Anion gap Latest Ref Range: 3.0 - 18 mmol/L 10   Glucose Latest Ref Range: 74 - 99 mg/dL 98   BUN Latest Ref Range: 7.0 - 18 MG/DL 50 (H)   Creatinine Latest Ref Range: 0.6 - 1.3 MG/DL 9.56 (H)   BUN/Creatinine ratio Latest Ref Range: 12 - 20   8 (L)   Calcium Latest Ref Range: 8.5 - 10.1 MG/DL 7.6 (L)   Phosphorus Latest Ref Range: 2.5 - 4.9 MG/DL 3.5   Magnesium Latest Ref Range: 1.8 - 2.4 mg/dL 2.1   GFR est non-AA Latest Ref Range: >60 ml/min/1.2m2 11 (L)   GFR est AA Latest Ref Range: >60 ml/min/1.61m2 13 (L)   CK Latest Ref Range: 39 - 308 U/L 90  CK-MB Index Latest Ref Range: 0.0 - 4.0 % 1.7   CK - MB Latest Ref Range: 0.5 - 3.6 ng/ml 1.5   Troponin-I, Qt. Latest Ref Range: 0.0 - 0.045 NG/ML 0.21 (H)   Results for Tyler Ballard, Tyler Ballard (MRN [id number]) as of 10/29/2015 00:30   Ref. Range 10/17/2015 15:05   Hepatitis B surface Ag Latest Ref Range: <1.00 Index <0.10   Hep B surface Ag Interp. Latest Ref Range: NEG   NEGATIVE   Hepatitis B surface Ab Latest Ref Range: >10.0 mIU/mL 5.54 (L)    Hep B surface Ab Interp. Latest Ref Range: POS   NEGATIVE (A)   Hep B surface Ab comment Latest Units:   Samples with a  v...                                                                                                                                 DIET   Renal     Other      NPO       Diabetic      PRIMARY NURSE REPORT: First initial/Last name/Title  CC Paternite RN    Pre Dialysis:    Time:  10:00     EDUCATION:     Patient  Other         Knowledge Basis: None Minimal  Substantial   Barriers to learning: intubated N/A    Access Care      S&S of infection      Fluid Management     K+     Procedural    Albumin      Medications      Tx Options      Transplant      Diet      Other   Teaching Tools:   Explain   Demo   Handouts  Video  Patient response:    Verbalized understanding   Teach back   Return demonstration  Requires follow up   Inappropriate due to            RO/HEMODIALYSIS MACHINE SAFETY CHECKS ??? Before each treatment:     Machine Number:                   [facility]                                   Unit Machine #  with centralized RO                                   Portable Machine #1/RO serial # [id number]  Portable Machine #2/RO serial # [id number]                                   Portable Machine #3/RO serial # [id number]                                                                                                       [facility]                                   Portable Machine #11/RO serial # [id number]                                    Portable Machine #12/RO serial # [id number]                                   Portable Machine #13/RO serial #  [id number]      Alarm Test:  Pass time 0945      Other:          RO/Machine Log Complete      Temp    35          Extracorporeal Circuit Tested for integrity   Dialysate: pH 7.4 Conductivity: Meter   14.     HD Machine   14.1                 TCD: 13.9   Dialyzer Lot # [id number]           Blood Tubing Lot # [id number]        Saline Lot #  [id number]     CHLORINE TESTING-Before each treatment and every 4 hours    Total Chlorine:  less than 0.1 ppm  Time: 0945 4 Hr/2nd Check Time: 13:00   (if greater than 0.1 ppm from Primary then every 30 minutes from Secondary)     TREATMENT INITIATION ??? with Dialysis Precautions:    All Connections Secured                  Saline Line Double Clamped    Venous Parameters Set                   Arterial Parameters Set     Prime Given  250 ml               Air Foam Detector Engaged      Treatment Initiation Note:      Medication Dose Volume Route Initials Dialyzer Cleared:  Good  Fair   Poor    Blood processed:  66.6 L  UF Removed  2500 Ml    Post Wt: 102.9    kg  POst BP:   145/79     Pulse: 76     Respirations:  16Temperature: 98.4                                   Post Tx Vascular Access: AVF/AVG: Bleeding stopped Art  min. Ven.  Min   N/A                                   Catheter: Locking solution: sodium citrate 1:1000 Art. 2 Ven.                                   Post Assessment:                                    Skin:   Warm   Dry  Diaphoretic     Flushed   Pale  Cyanotic   [facility] Signatures    [employer] Title  RN Initials jsp  [phone] Lungs:  Clear     Course   Crackles   Wheezing  Diminished         Cardiac:  Regular    Irregular    Monitor   N/A  Rhythm:           Edema:   None     General      Facial    Pedal     UE     LE       Pain: 0  Post Treatment Note: Patient tolerated treatment well, preparing for extubation, vitals stable     POST TREATMENT PRIMARY NURSE HANDOFF REPORT:     First initial/Last name/Title     Ballard Paternite    Post Dialysis: Time:  140     Abbreviations: AVG-arterial venous graft, AVF-arterial venous fistula, IJ-Internal Jugular, Subcl-Subclavian, Fem-Femoral, Tx-treatment, AP/HR-apical heart rate, DFR-dialysate flow rate, BFR-blood flow rate,  AP-arterial pressure, VP-venous pressure, UF-ultrafiltrate, TMP-transmembrane pressure, Ven-Venous, Art-Arterial, RO-Reverse Osmosis

## 2015-10-28 NOTE — Other (Signed)
Glycemic Control: Pt discussed in interdisciplinary rounds. Blood glucose had trended down and pt started on D10NS at 50 mL/hr. Pt is currently NPO. Continue inpatient monitoring and intervention.     Garner GavelShannon Cairns, RD, CDE

## 2015-10-28 NOTE — Other (Signed)
Sputum collected and sent to Lab as ordered. No urine specimen , patient anuric.

## 2015-10-28 NOTE — Progress Notes (Signed)
Patiant extubated to 4 LPM NC following successful SBT, positive cuff leak test, and ABG. RN Elnita Maxwellheryl at bedside. Airway suctioned via ETT pre and orally pre and post procedure. No complications. Will continue to monitor.

## 2015-10-28 NOTE — Other (Signed)
CRITICAL CARE INTERDISCIPLINARY ROUNDS      Patient Information:    Name:   Tyler Ballard    Age:   34 y.o.    Admission Date:   10/16/2015    Readmit Risk Assessment Information:      Readmit Risk Tool Support Systems: Other (comments) [facility]([place] [facility]), Relationship with Primary Physician Group: Seen at least one time within the past 6 months    Surgery Date:      Day of Stay:     Expected Discharge Date:        Attending Provider:   Wanda Plumpirghayu Desai, MD    Surgeon:        Consultant:       Primary Care Provider:   Phys Other, MD    Problem List:     Patient Active Problem List   Diagnosis Code   ??? 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 ESRD (end stage renal disease) on dialysis (HCC) N18.6, Z99.2   ??? HTN (hypertension) I10   ??? Arthralgia M25.50   ??? Persistent vomiting R11.10   ??? Sickle cell crisis (HCC) D57.00   ??? Nausea & vomiting R11.2   ??? Pancytopenia (HCC) D61.818   ??? ARF (acute renal failure) (HCC) N17.9   ??? Malingering Z76.5   ??? Do not give narcotics IMO0001   ??? Pulmonary edema J81.1   ??? Drug-seeking behavior Z76.5   ??? Noncompliance of patient with renal dialysis (HCC) Z91.15   ??? Positive blood culture R78.81   ??? Clotted dialysis access [facility](HCC) T82.49XA   ??? Dialysis patient (HCC) Z99.2   ??? Secondary hyperparathyroidism (HCC) N25.81   ??? ESRD on dialysis (HCC) N18.6, Z99.2   ??? Renal failure N19   ??? HIV (human immunodeficiency virus infection) (HCC) Z21   ??? ESRD (end stage renal disease) (HCC) N18.6   ??? Secondary hyperparathyroidism of renal origin (HCC) N25.81       Principal Problem:  <principal problem not specified>      Procedure:       During rounds the following quality care indicators and evidence based practices were addressed :     DVT Prophylaxis, Pressure Ulcer Prevention, Pain Management, Sepsis resuscitation and management, Nutritional Status, Critical Care Interventions Airways, Drains and Lines and IHI Bundles: Central Line Bundle Followed  and Vent Bundle Followed, Vent Day 2            Acute MI/PCI:   Not applicable    Heart Failure:    Central Line Bundle Followed  and Vent Bundle Followed, Vent Day 2    Cardiac Surgery:  Not applicable    SCIP Measures for other Surgeries:   Not applicable    Pneumonia:    Appropriate Antibiotic Selection (ICU versus Non-ICU)    Stroke:  Patient's Personal Risk Factors for Stroke are:   hypertension, family history, hyperlipidemia or diabetes mellitus    NIH Stroke Score       Transfer Level of Care:  Not Ready for Transfer    The patient will require the following interventions based on the Readmission Risk Assessment:  Pharmacy evaluation and teaching, Care Management involvement for home health follow up for:  mobility and assistance with ADL's and Spiritual Care evaluation      Discharge Management:  Home    Anticipated Discharge Date:  3days      Interdisciplinary team rounds were held  with the following team membersCare Management, Diabetes Treatment Specialist, Nursing, Nutrition, Pastoral  Care, Pharmacy, Physician and Clinical Coordinator and the  patient and healthcare POA (documentation required). Plan of care discussed. See clinical pathway and/or care plan for interventions and desired outcomes. [employer] guard with patient. Breathing trial to be started with Precedex infusing. Dialysis today.

## 2015-10-28 NOTE — Progress Notes (Signed)
SBT continues- PS 7, PEEP +5, FiO2 30%. Tolerating better.      10/28/15 1525   Weaning Parameters   Spontaneous Breathing Trial Complete Yes   Resp Rate Observed 25   Ve 8.3   VT 412   RSBI 62     ABG to follow.

## 2015-10-28 NOTE — Progress Notes (Signed)
NUTRITION    BPA/MST Referral      RECOMMENDATIONS / PLAN:     - If patient remains intubated, recommend starting tube feeds of Vital High Protein at 10 mL/hr and advance as tolerated by 10 mL q 4 hours to goal rate of 50 mL/hr with 100 mL q 4 hour water flushes and add Prosource BID.    Goal Regimen: Vital High Protein at 50 mL/hr + 100 mL q 4 hour water flushes to provide:  1200 kcal, 105 gm protein, 134 gm CHO, 0 gm fiber, 1003 mL free water, 1603 mL total water  Tube Feeds + Prosource BID to provide: 1320 kcal, 135 kcal     NUTRITION INTERVENTIONS:      Enteral nutrition support   Vitamin/mineral supplementation   Nutrition-related medication management   IV fluids   Coordination of nutrition care: interdisciplinary rounds   Continue inpatient monitoring and intervention:    Nutrition counseling/education   Meals/Snacks: Modified diet, monitor PO intake    ASSESSMENT:     Subjective/Objective:  ESRD on hemodialysis. Decreased appetite and poor po intake of recent meals. Intubated after procedure 11/8 for temporary dialysis catheter placement. SBT and possible extubation today, will wait to feed per MD.     Tube Feeding: none   Water Flushes: none   Residuals: OGT clamped     Diet:  NPO  Food Allergies: NKFA    BM: 11/8   Skin Integrity: leg surgical incision, catheter site   Edema: none     Pertinent Medications: Reviewed; phoslo, folic acid, SSI, phenergan     Labs:  Recent Labs      10/28/15   0310  10/27/15   0937  10/27/15   0553   NA  134*  135*  135*   K  4.3  6.5*  6.1*   CL  101  100  102   CO2  _0 GLU  98  91  100*   BUN  50*  73*  75*   CREA  5.98*  8.73*  8.62*   CA  7.6*  7.5*  7.1*   MG  2.1   --    --    PHOS  3.5   --   5.6*    Lab Results   Component Value Date/Time    GLUCOSE 98 10/28/2015 03:10 AM    GLUCOSE (POC) 102 10/28/2015 11:56 AM    GLUCOSE (POC) 84 09/04/2013 07:35 PM    GLUCOSE (POC) 73 08/06/2013 01:16 PM         Intake/Output Summary (Last 24 hours) at 10/28/15 1321   Last data filed at 10/28/15 1025   Gross per 24 hour   Intake     50 ml   Output   1000 ml   Net   -950 ml       Anthropometrics:  Ht Readings from Last 1 Encounters:   10/17/15 _1  (1.702 m)       Last 3 Recorded Weights in this Encounter    10/26/15 0626 10/27/15 0507 10/28/15 0400   Weight: 103.2 kg (227 lb 8.2 oz) 105.9 kg (233 lb 7.5 oz) 101.6 kg (223 lb 15.8 oz)       Body mass index is 35.08 kg/(m^2).  Obese, Class II     Weight History: 23 lb, 9% weight loss x month per chart ??    Weight Metrics 10/28/2015 03/28/2014 11/01/2013 09/29/2013 09/06/2013 09/05/2013 08/06/2013  Weight 223 lb 15.8 oz 170 lb 180 lb 180 lb 180 lb 243 lb 222 lb   BMI 35.08 kg/m2 28.29 kg/m2 29.95 kg/m2 29.95 kg/m2 29.95 kg/m2 40.44 kg/m2 -          Admitting Diagnosis: ESRD (end stage renal disease) ([place]) [N18.6]  Past Medical History   Diagnosis Date   ??? AVN (avascular necrosis of bone) (HCC)      right hip   ??? Chronic kidney disease    ??? Chronic kidney disease (CKD), stage V ([place])    ??? Dialysis patient [facility])    ??? ESRD on hemodialysis ([place])    ??? HIV (human immunodeficiency virus infection) ([place])    ??? HTN (hypertension) 07/06/2012   ??? HTN (hypertension)    ??? Hypertension    ??? Malingering      Sickle Cell testing negative   ??? Malingering      Patient states has had sickle cell disease since age of 65, hemoglobin electrophoresis at [facility] both negative for for sickle cell disease   ??? Noncompliance of patient with renal dialysis ([place]) 09/06/2013   ??? Sickle cell anemia (HCC)      NEGATIVE test. Likely patient states this Dx for secondary gain   ??? Sickle cell disease ([place])      TOLD BY PATIENT AND NOT TRUE   ??? Stroke ([place])      2014 pts states he has weakness to right side upper and lower extremities        Education Needs:         None identified  Identified - Not appropriate at this time    Identified and addressed - refer to education log  Learning Limitations:    None identified   Identified: intubated      Cultural, religious & ethnic food preferences:   None identified     Identified and addressed     NUTRITION NEEDS & DIAGNOSIS:     Estimated Nutrition Needs:   Calories: 1100-1400 kcal (11-14 kcal/kg) based on    Actual BW: 100 kg      IBW:   Protein: 134-161 gm (2-2.4 gm/kg) based on    Actual BW:       IBW: 67 kg  Fluid: 1 mL/kcal    Patient expected to meet estimated nutrient needs:    Yes     No     Nutrition Diagnosis: Increased energy needs related to increased energy expenditure as evidenced by ESRD on HD.     MONITORING & EVALUATION:     Nutrition Goals:  1. Patient will tolerate enteral nutrition formula and regimen without difficulty within the next 3-5 days.    2. Patient will meet their estimated nutritional needs through adequate enteral nutrition within the next 3-5 days.     Previous Goal(s) Outcome (for follow-up assessments only):    Goal 1:     Met      Not Met      Goal 2:     Met      Not Met        Previous Recommendations (for follow-up assessments only):        Implemented          Not Implemented (RD to address)       Discharge Planning: pending ability to tolerate po     Participated in care planning, discharge planning, & interdisciplinary rounds as appropriate      Chaney Malling, RD, [place]  Pager: [phone]

## 2015-10-29 ENCOUNTER — Inpatient Hospital Stay: Payer: PRIVATE HEALTH INSURANCE | Primary: Family Medicine

## 2015-10-29 LAB — HEMOGLOBIN FRACTIONATION
HEMOGLOBIN A2: 2.4 % (ref 0.7–3.1)
HEMOGLOBIN F: 1.8 % (ref 0.0–2.0)
HEMOGLOBIN S: 0 %
HGB A: 95.8 % (ref 94.0–98.0)
HGB SOLUBILITY: NEGATIVE
Hemoglobin A2: 2.4 % (ref 0.7–3.1)
Hemoglobin A: 95.8 % (ref 94.0–98.0)
Hemoglobin C: 0 %
Hemoglobin C: 0 %
Hemoglobin F: 1.8 % (ref 0.0–2.0)
Hemoglobin S: 0 %
Hgb Solubility: NEGATIVE

## 2015-10-29 LAB — CBC WITH AUTOMATED DIFF
ABS. BASOPHILS: 0 10*3/uL (ref 0.0–0.1)
ABS. EOSINOPHILS: 0.1 10*3/uL (ref 0.0–0.4)
ABS. LYMPHOCYTES: 0.5 10*3/uL — ABNORMAL LOW (ref 0.9–3.6)
ABS. MONOCYTES: 0.2 10*3/uL (ref 0.05–1.2)
ABS. NEUTROPHILS: 1.5 10*3/uL — ABNORMAL LOW (ref 1.8–8.0)
BASOPHILS: 1 % (ref 0–2)
EOSINOPHILS: 3 % (ref 0–5)
HCT: 25.5 % — ABNORMAL LOW (ref 36.0–48.0)
HGB: 8.3 g/dL — ABNORMAL LOW (ref 13.0–16.0)
LYMPHOCYTES: 23 % (ref 21–52)
MCH: 29.6 PG (ref 24.0–34.0)
MCHC: 32.5 g/dL (ref 31.0–37.0)
MCV: 91.1 FL (ref 74.0–97.0)
MONOCYTES: 9 % (ref 3–10)
MPV: 10.2 FL (ref 9.2–11.8)
NEUTROPHILS: 64 % (ref 40–73)
PLATELET: 73 10*3/uL — ABNORMAL LOW (ref 135–420)
RBC: 2.8 M/uL — ABNORMAL LOW (ref 4.70–5.50)
RDW: 18.9 % — ABNORMAL HIGH (ref 11.6–14.5)
WBC: 2.2 10*3/uL — ABNORMAL LOW (ref 4.6–13.2)

## 2015-10-29 LAB — PROTHROMBIN TIME + INR
INR: 1.3 — ABNORMAL HIGH (ref 0.8–1.2)
Prothrombin time: 15.4 s — ABNORMAL HIGH (ref 11.5–15.2)

## 2015-10-29 LAB — EKG, 12 LEAD, INITIAL
Atrial Rate: 80 {beats}/min
Calculated P Axis: 40 degrees
Calculated R Axis: 80 degrees
Calculated T Axis: 33 degrees
P-R Interval: 280 ms
Q-T Interval: 434 ms
QRS Duration: 64 ms
QTC Calculation (Bezet): 500 ms
Ventricular Rate: 80 {beats}/min

## 2015-10-29 LAB — METABOLIC PANEL, BASIC
Anion gap: 8 mmol/L (ref 3.0–18)
BUN/Creatinine ratio: 6 — ABNORMAL LOW (ref 12–20)
BUN: 28 MG/DL — ABNORMAL HIGH (ref 7.0–18)
CO2: 29 mmol/L (ref 21–32)
Calcium: 8 MG/DL — ABNORMAL LOW (ref 8.5–10.1)
Chloride: 101 mmol/L (ref 100–108)
Creatinine: 4.7 MG/DL — ABNORMAL HIGH (ref 0.6–1.3)
GFR est AA: 17 mL/min/{1.73_m2} — ABNORMAL LOW (ref >60)
GFR est non-AA: 14 mL/min/{1.73_m2} — ABNORMAL LOW (ref >60)
Glucose: 89 mg/dL (ref 74–99)
Potassium: 3.9 mmol/L (ref 3.5–5.5)
Sodium: 138 mmol/L (ref 136–145)

## 2015-10-29 LAB — GLUCOSE, POC
Glucose (POC): 89 mg/dL (ref 70–110)
Glucose (POC): 86 mg/dL (ref 70–110)

## 2015-10-29 LAB — PHOSPHORUS: Phosphorus: 3.7 MG/DL (ref 2.5–4.9)

## 2015-10-29 MED ORDER — IOPAMIDOL 61 % IV SOLN
300 mg iodine /mL (61 %) | Freq: Once | INTRAVENOUS | Status: DC
Start: 2015-10-29 — End: 2015-10-29

## 2015-10-29 MED ORDER — HEPARIN (PORCINE) 1,000 UNIT/ML IJ SOLN
1000 unit/mL | INTRAMUSCULAR | Status: DC | PRN
Start: 2015-10-29 — End: 2015-10-29

## 2015-10-29 MED ORDER — HEPARIN (PORCINE) IN NS (PF) 1,000 UNIT/500 ML IV
1000 unit/500 mL | INTRAVENOUS | Status: AC
Start: 2015-10-29 — End: 2015-10-29
  Administered 2015-10-29: 17:00:00

## 2015-10-29 MED ORDER — MIDAZOLAM 1 MG/ML IJ SOLN
1 mg/mL | INTRAMUSCULAR | Status: DC | PRN
Start: 2015-10-29 — End: 2015-10-29

## 2015-10-29 MED ORDER — LIDOCAINE (PF) 10 MG/ML (1 %) IJ SOLN
10 mg/mL (1 %) | INTRAMUSCULAR | Status: AC
Start: 2015-10-29 — End: 2015-10-29
  Administered 2015-10-29: 17:00:00 via SUBCUTANEOUS

## 2015-10-29 MED ORDER — HEPARIN (PORCINE) 1,000 UNIT/ML IJ SOLN
1000 unit/mL | INTRAMUSCULAR | Status: DC | PRN
Start: 2015-10-29 — End: 2015-10-29
  Administered 2015-10-29: 17:00:00 via INTRAVENOUS

## 2015-10-29 MED ORDER — FENTANYL CITRATE (PF) 50 MCG/ML IJ SOLN
50 mcg/mL | INTRAMUSCULAR | Status: DC | PRN
Start: 2015-10-29 — End: 2015-10-29

## 2015-10-29 MED ORDER — HEPARIN (PORCINE) IN NS (PF) 1,000 UNIT/500 ML IV
1000 unit/500 mL | Freq: Once | INTRAVENOUS | Status: DC
Start: 2015-10-29 — End: 2015-10-29

## 2015-10-29 MED ORDER — HEPARIN (PORCINE) IN NS (PF) 1,000 UNIT/500 ML IV
1000 unit/500 mL | Freq: Once | INTRAVENOUS | Status: AC
Start: 2015-10-29 — End: 2015-10-29

## 2015-10-29 MED ORDER — DIPHENHYDRAMINE HCL 50 MG/ML IJ SOLN
50 mg/mL | Freq: Once | INTRAMUSCULAR | Status: AC
Start: 2015-10-29 — End: 2015-10-29
  Administered 2015-10-29: 07:00:00 via INTRAVENOUS

## 2015-10-29 MED ORDER — FENTANYL CITRATE (PF) 50 MCG/ML IJ SOLN
50 mcg/mL | INTRAMUSCULAR | Status: DC | PRN
Start: 2015-10-29 — End: 2015-10-29
  Administered 2015-10-29 (×3): via INTRAVENOUS

## 2015-10-29 MED ORDER — LIDOCAINE (PF) 10 MG/ML (1 %) IJ SOLN
10 mg/mL (1 %) | INTRAMUSCULAR | Status: DC | PRN
Start: 2015-10-29 — End: 2015-10-29

## 2015-10-29 MED ORDER — MIDAZOLAM 1 MG/ML IJ SOLN
1 mg/mL | INTRAMUSCULAR | Status: DC | PRN
Start: 2015-10-29 — End: 2015-10-29
  Administered 2015-10-29 (×2): via INTRAVENOUS

## 2015-10-29 MED ORDER — IOPAMIDOL 61 % IV SOLN
300 mg iodine /mL (61 %) | Freq: Once | INTRAVENOUS | Status: AC
Start: 2015-10-29 — End: 2015-10-29
  Administered 2015-10-29: 17:00:00 via INTRAVENOUS

## 2015-10-29 MED FILL — HYDROMORPHONE (PF) 1 MG/ML IJ SOLN: 1 mg/mL | INTRAMUSCULAR | Qty: 1

## 2015-10-29 MED FILL — HEPARIN (PORCINE) IN NS (PF) 1,000 UNIT/500 ML IV: 1000 unit/500 mL | INTRAVENOUS | Qty: 500

## 2015-10-29 MED FILL — CALCIUM ACETATE 667 MG CAP: 667 mg | ORAL | Qty: 1

## 2015-10-29 MED FILL — DIPHENHYDRAMINE HCL 50 MG/ML IJ SOLN: 50 mg/mL | INTRAMUSCULAR | Qty: 1

## 2015-10-29 MED FILL — PROMETHAZINE 25 MG TAB: 25 mg | ORAL | Qty: 1

## 2015-10-29 MED FILL — ONDANSETRON (PF) 4 MG/2 ML INJECTION: 4 mg/2 mL | INTRAMUSCULAR | Qty: 2

## 2015-10-29 MED FILL — TRAZODONE 50 MG TAB: 50 mg | ORAL | Qty: 1

## 2015-10-29 MED FILL — HYDROXYUREA 500 MG CAPSULE: 500 mg | ORAL | Qty: 1

## 2015-10-29 MED FILL — ISOVUE-300  61 % INTRAVENOUS SOLUTION: 300 mg iodine /mL (61 %) | INTRAVENOUS | Qty: 150

## 2015-10-29 MED FILL — DIPRIVAN 10 MG/ML INTRAVENOUS EMULSION: 10 mg/mL | INTRAVENOUS | Qty: 20

## 2015-10-29 MED FILL — DEXTROSE 10% IN WATER (D10W) IV: 10 % | INTRAVENOUS | Qty: 1000

## 2015-10-29 MED FILL — OXYCODONE-ACETAMINOPHEN 7.5 MG-325 MG TAB: ORAL | Qty: 1

## 2015-10-29 MED FILL — HEPARIN (PORCINE) 1,000 UNIT/ML IJ SOLN: 1000 unit/mL | INTRAMUSCULAR | Qty: 10

## 2015-10-29 MED FILL — MIDAZOLAM 1 MG/ML IJ SOLN: 1 mg/mL | INTRAMUSCULAR | Qty: 2

## 2015-10-29 MED FILL — FENTANYL CITRATE (PF) 50 MCG/ML IJ SOLN: 50 mcg/mL | INTRAMUSCULAR | Qty: 2

## 2015-10-29 MED FILL — LIDOCAINE (PF) 20 MG/ML (2 %) IJ SOLN: 20 mg/mL (2 %) | INTRAMUSCULAR | Qty: 5

## 2015-10-29 MED FILL — FAMOTIDINE (PF) 20 MG/2 ML IV: 20 mg/2 mL | INTRAVENOUS | Qty: 2

## 2015-10-29 MED FILL — LIDOCAINE (PF) 10 MG/ML (1 %) IJ SOLN: 10 mg/mL (1 %) | INTRAMUSCULAR | Qty: 30

## 2015-10-29 NOTE — Other (Signed)
TRANSFER - IN REPORT:    Verbal report received from Kyla Balzarineonya Griffiths, RN(name) on [employer]Elbert C Maeda  being received from Cath holding(unit) for routine progression of care      Report consisted of patient???s Situation, Background, Assessment and   Recommendations(SBAR).     Information from the following report(s) SBAR, Kardex and MAR was reviewed with the receiving nurse.    Opportunity for questions and clarification was provided.      Assessment completed upon patient???s arrival to unit and care assumed.

## 2015-10-29 NOTE — Procedures (Signed)
[facility]  *** FINAL REPORT ***    Name: Tyler Ballard, Tyler Ballard  MRN: [id number]    Inpatient  DOB: 27 Nov 1981  HIS Order #: [id number]  TRAKnet Visit #: [id number]  Date: 29 Oct 2015    TYPE OF TEST: Dialysis Access Duplex    REASON FOR TEST    Graft:-  Summary:   Left arm straight fistula with ptfe  Op. Date:  12/20/1999  Surgeon:   Unknown    Results:-            Velocity  Ratio  Flow Volume Stenosis            --------  -----  ----------- --------  Inflow:      8.0  Proximal:    8.0      1.0  Mid-graft:   9.0      1.1  Distal:      8.0      0.9  Outflow:    32.0      4.0      N/A    Mean Flow Volume:    INTERPRETATION/FINDINGS  Duplex images were obtained using 2-D gray scale, color flow and  spectral doppler analysis.  Note:  Patient stated he was informed the right arm will no longer be  considered as an option for dialysis access, therefore, the left arm  was assessed on today's exam.  Duplex exam of the dialysis access in the left arm reveals :  1. No significant flow in the graft.  2. Velocities in the inflow artery and within the graft are <10 cm/s.    ADDITIONAL COMMENTS    I have personally reviewed the data relevant to the interpretation of  this  study.    TECHNOLOGIST: Jennifer Tempesco, RVT  Signed: 10/29/2015 11:30 AM    PHYSICIAN: Cyan Clippinger, D.O.  Signed: 10/29/2015 04:08 PM

## 2015-10-29 NOTE — Other (Signed)
Bedside and Verbal shift change report given to Carla Wilson RN (oncoming nurse) by R. Joyner, RN (offgoing nurse).  Report given with SBAR, Kardex and MAR.

## 2015-10-29 NOTE — Progress Notes (Signed)
Pt given cap for his head and cup of ice as requested

## 2015-10-29 NOTE — Progress Notes (Signed)
TRANSFER - OUT REPORT:    Verbal report given to [facility] RN(name) on [name]  being transferred to [facility] room 366(unit) for routine progression of care       Report consisted of patient???s Situation, Background, Assessment and   Recommendations(SBAR).     Information from the following report(s) SBAR, Kardex and MAR was reviewed with the receiving nurse.    Lines:   Double Lumen Hickman 09/04/13 Left Other(comment) (Active)   Central Line Being Utilized Yes 10/29/2015  8:00 AM   Criteria for Appropriate Use Dialysis/apheresis 10/29/2015  8:00 AM   Site Assessment Clean, dry, & intact 10/29/2015  8:00 AM   Infiltration Assessment 0 10/29/2015  8:00 AM   Affected Extremity/Extremities Color distal to insertion site pink (or appropriate for race);Pulses palpable;Range of motion performed 10/29/2015  8:00 AM   Date of Last Dressing Change 10/27/15 10/27/2015  8:00 PM   Dressing Status Clean, dry, & intact 10/29/2015  8:00 AM   Dressing Type 4 X 4 10/29/2015  8:00 AM   Proximal Hub Color/Line Status Blue;Flushed 10/27/2015  6:00 PM   Positive Blood Return (Medial Site) Yes 10/27/2015  6:00 PM   Distal Hub Color/Line Status Red;Flushed 10/27/2015  6:00 PM   Positive Blood Return (Lateral Site) Yes 10/27/2015  6:00 PM   Alcohol Cap Used No 10/27/2015  6:00 PM       Peripheral IV 10/27/15 Right Other(comment) (Active)   Site Assessment Clean, dry, & intact 10/29/2015  8:00 AM   Phlebitis Assessment 0 10/29/2015  8:00 AM   Infiltration Assessment 0 10/29/2015  8:00 AM   Dressing Status Clean, dry, & intact 10/29/2015  8:00 AM   Dressing Type Transparent;Tape 10/29/2015  8:00 AM   Hub Color/Line Status Blue;Capped 10/29/2015  8:00 AM   Action Taken Other (comment) 10/27/2015  8:00 PM   Alcohol Cap Used No 10/27/2015  8:00 PM        Opportunity for questions and clarification was provided.      Patient transported with:   Monitor  O2 @ 2 liters

## 2015-10-29 NOTE — Progress Notes (Signed)
[facility]  ICU Progress Note      Name: Tyler Ballard   DOB: 1981-01-10   MRN: [id number]   Date: 10/29/2015 10:56 AM     I have reviewed the flowsheet and previous day???s notes.  Events overnight reviewed and discussed with nursing staff. Vital signs and records reviewed.      Subjective: Tyler Ballard has been seen and evaluated in ICU. Patient is a 34 y.o. male with PMHx significant for HIV/AIDS, HTN, ESRD. [facility] jail after having a non-functioning dialysis catheter that needed to be replaced and electrolyte dysfunction. His hospital course was complicated by 3 broken dialysis catheters d/t pt tampering. He went to the OR today to have a temoporary dialysis catheter placed, procedure was uneventful but just after the procedure was finished, his HR began to brady down and he became pulseless. CPR was started with very brief round of compressions and ROSC was obtained after 1 dose epi given. He was transferred to ICU for higher level of care.   ??  10/29/2015  Admits to having nausea overnight, states he had one episode of vomiting overnight.  Still having nausea at this time.  Admits chronic L sided abdominal pain 2' to pancreatitis.  Denies any SOB, cough, CP, palpitations.  Admit to leg swelling which is chronic and unchanged.  Complains of hip pain 2' to hip replacement d/t AVN, he is receiving prn dilaudid. To have fistula de-clotted today in OR.     The patient is unable to give any meaningful history or review of systems because the patient is:  Intubated Sedated   Unresponsive      The patient is critically ill on      Mechanical ventilation Pressors   BiPAP                  ROS:A comprehensive review of systems was negative except for that written in the HPI.       Medication Review:  ?? Pressors - None   ?? Sedation - none  ?? Antibiotics - None   ?? Pain - Dilaudid, Percocet PRN   ?? GI/ DVT - Pepcid (GI), SCD (DVT)  ?? Others (other gtts)     Safety Bundles: VAP Bundle/ CAUTI/ Severe Sepsis Protocol/ Electrolyte Replacement Protocol    Vital Signs:    Visit Vitals   ??? BP 116/55 (BP 1 Location: Right arm, BP Patient Position: At rest)   ??? Pulse 92   ??? Temp 99.7 ??F (37.6 ??C)   ??? Resp 17   ??? Ht  (1.702 m)   ??? Wt 101.6 kg (223 lb 15.8 oz)   ??? SpO2 100%   ??? BMI 35.08 kg/m2       O2 Device: Nasal cannula   O2 Flow Rate (L/min): 2 l/min   Temp (24hrs), Avg:98.2 ??F (36.8 ??C), Min:97.6 ??F (36.4 ??C), Max:99.7 ??F (37.6 ??C)       Intake/Output:   Last shift:      11/10 0701 - 11/10 1900  In: 650 [I.V.:650]  Out: -   Last 3 shifts: 11/08 1901 - 11/10 0700  In: 1323.3 [I.V.:1323.3]  Out: 3560     Intake/Output Summary (Last 24 hours) at 10/29/15 0850  Last data filed at 10/29/15 0800   Gross per 24 hour   Intake 1394.51 ml   Output   2560 ml   Net -1165.49 ml       Ventilator Settings:  Ventilator  Mode: Assist control, VC+  Respiratory Rate  Resp Rate Observed: 25  Back-Up Rate: 14  Insp Time (sec): 1 sec  I:E Ratio: 1:3  Ventilator Volumes  Vt Set (ml): 400 ml  Vt Exhaled (Machine Breath) (ml): 411 ml  Ve Observed (l/min): 6.26 l/min  Ventilator Pressures  PIP Observed (cm H2O): 22 cm H2O  Plateau Pressure (cm H2O): 23 cm H2O  MAP (cm H2O): 9.4  PEEP/VENT (cm H2O): 5 cm H20    Physical Exam:    General: awake, alert, NAD, VSS.  HEENT:  Anicteric sclerae; pink palpebral conjunctivae; mucosa moist  Resp:  Symmetrical chest expansion, no accessory muscle use; good airway entry; no rales/ wheezing/ rhonchi noted  CV:  S1, S2 present; regular rate and rhythm, HD cath to left chest wall and right groin. Both sites are clean, dry, intact with no increased warmth, erythema, swelling, or drainage present.   Patient with right upper AV fistula with +bruit and palpable thrill  GI:  Abdomen soft, non-tender; (+) active bowel sounds  Extremities:  +2 pulses on all extremities; 1+ edema b/l LE  Skin:  Warm; no rashes/ lesions noted   Neurologic: A&Ox3, moves all fours, answers questions appropriately  Devices:  PIV, dialysis cath L subclavian and R femoral      DATA:     Current Facility-Administered Medications   Medication Dose Route Frequency   ??? sodium chloride 0.9 % in dextrose 10% 1,040 mL infusion   IntraVENous CONTINUOUS   ??? doxercalciferol (HECTOROL) 4 mcg/2 mL injection 1 mcg  1 mcg IntraVENous Q MON, WED & FRI   ??? HYDROmorphone (PF) (DILAUDID) injection 1 mg  1 mg IntraVENous Q4H PRN   ??? albuterol-ipratropium (DUO-NEB) 2.5 MG-0.5 MG/3 ML  3 mL Nebulization Q6H PRN   ??? nicotine (NICODERM CQ) 14 mg/24 hr patch 1 Patch  1 Patch TransDERmal DAILY   ??? phenol throat spray (CHLORASEPTIC) 1 Spray  1 Spray Oral PRN   ??? oxyCODONE-acetaminophen (PERCOCET 7.5) 7.5-325 mg per tablet 1 Tab  1 Tab Oral Q6H PRN   ??? sodium chloride (NS) flush 5-10 mL  5-10 mL IntraVENous Q8H   ??? sodium chloride (NS) flush 5-10 mL  5-10 mL IntraVENous PRN   ??? insulin lispro (HUMALOG) injection   SubCUTAneous Q6H   ??? dextrose (D50W) injection syrg 12.5-25 g  25-50 mL IntraVENous PRN   ??? epoetin alfa (EPOGEN;PROCRIT) injection 10,000 Units  10,000 Units IntraVENous Q MON, WED & FRI   ??? famotidine (PF) (PEPCID) 20 mg in sodium chloride 0.9 % 10 mL injection  20 mg IntraVENous Q24H   ??? promethazine (PHENERGAN) suppository 25 mg  25 mg Rectal Q8H PRN   ??? heparin (porcine) 100 unit/mL injection 300 Units  300 Units InterCATHeter PRN   ??? 0.9% sodium chloride infusion 250 mL  250 mL IntraVENous PRN   ??? promethazine (PHENERGAN) tablet 25 mg  25 mg Oral Q6H PRN   ??? traZODone (DESYREL) tablet 50 mg  50 mg Oral QHS PRN   ??? cloNIDine HCl (CATAPRES) tablet 0.1 mg  0.1 mg Oral Q2H PRN   ??? hydrALAZINE (APRESOLINE) 20 mg/mL injection 10 mg  10 mg IntraVENous Q6H PRN   ??? diphenhydrAMINE (BENADRYL) capsule 25 mg  25 mg Oral Q6H PRN   ??? ammonium lactate (LAC-HYDRIN) 12 % lotion   Topical BID   ??? sodium chloride (NS) flush 5-10 mL  5-10 mL IntraVENous PRN    ??? naloxone (NARCAN) injection 0.1 mg  0.1 mg IntraVENous PRN   ??? glucose chewable tablet 16 g  4 Tab Oral PRN   ??? glucagon (GLUCAGEN) injection 1 mg  1 mg IntraMUSCular PRN   ??? sodium citrate 4 gram /100 mL (4 %) 0.08 g  2 mL Does Not Apply DIALYSIS PRN   ??? calcium acetate (PHOSLO) capsule 667 mg  1 Cap Oral TID WITH MEALS   ??? folic acid (FOLVITE) tablet 1 mg  1 mg Oral DAILY   ??? hydroxyurea (HYDREA) chemo cap 500 mg  500 mg Oral DAILY   ??? sodium citrate 4 gram /100 mL (4 %) 0.08 g  2 mL Hemodialysis DIALYSIS TUE, THU & SAT   ??? ondansetron (ZOFRAN) injection 4 mg  4 mg IntraVENous Q6H PRN         Labs: Results:       Chemistry Recent Labs      10/29/15   0250  10/28/15   0310  10/27/15   0937   GLU  89  98  91   NA  138  134*  135*   K  3.9  4.3  6.5*   CL  101  101  100   CO2  29  23  26    BUN  28*  50*  73*   CREA  4.70*  5.98*  8.73*   CA  8.0*  7.6*  7.5*   AGAP  8  10  9    BUCR  6*  8*  8*      CBC w/Diff Recent Labs      10/29/15   0250  10/28/15   0310  10/27/15   0553   WBC  2.2*  2.2*  2.0*   RBC  2.80*  3.10*  2.38*   HGB  8.3*  9.3*  7.0*   HCT  25.5*  27.4*  22.0*   PLT  73*  94*  77*   GRANS  64  61  49   LYMPH  23  26  34   EOS  3  3  6*      Coagulation Recent Labs      10/29/15   0250  10/28/15   0310   PTP  15.4*  15.3*   INR  1.3*  1.3*       Liver Enzymes No results for input(s): TP, ALB, TBIL, AP, SGOT, GPT in the last 72 hours.    No lab exists for component: DBIL   ABG Lab Results   Component Value Date/Time    PHI 7.471 (H) 10/28/2015 03:51 PM    PCO2I 35.8 10/28/2015 03:51 PM    PO2I 72 (L) 10/28/2015 03:51 PM    HCO3I 26.2 (H) 10/28/2015 03:51 PM    FIO2I 30 10/28/2015 03:51 PM      Microbiology Recent Labs      10/28/15   0455  10/28/15   0306  10/27/15   0254   CULT  PENDING  NO GROWTH 1 DAY  NO GROWTH 1 DAY          Telemetry: Sinus A-flutter Paced    A-fib Multiple PVC???s                    Imaging:  I have personally reviewed the patient???s radiographs   Radiographs reviewed with radiologist   No change from prior, tubes and lines in adequate position  Improved   Worsening    CXR Results  (Last 48  hours)               10/28/15 0545  XR CHEST PORT Final result    Impression:  Impression:        1. Interval repositioning and satisfactory positioning of the endotracheal tube.   Lines and tubes otherwise as above.   2. Stable enlargement of the cardiac silhouette and pulmonary vascular   congestion   3. Slightly increased haziness at the lung bases, possibly atelectasis or   infiltrate/edema. Possible trace left pleural effusion.           Narrative:  AP CHEST, PORTABLE       INDICATION: Intubation       COMPARISON: Prior chest x-rays, most recent 10/27/2015       FINDINGS:  EKG leads overlie the patient. Endotracheal tube repositioned, tip   projects approximately 2.8 cm above the carina. Nasogastric tube terminates at   the proximal gastric body. Dual lumen central venous catheter is in stable   position. Subclavian/axillary vascular stents again noted.       Able enlargement of the cardiac silhouette.  Persistent pulmonary venous   congestion . Stable to slightly increased in haziness at the lung bases  .   Possible small left pleural effusion. No evidence for pneumothorax. No acute   osseous abnormalities are identified.           10/27/15 1741  XR CHEST PORT Final result    Impression:  Impression:        1. Endotracheal tube tip projects at the level of the carina, direct it towards   the right mainstem bronchus.   -Subsequent chest x-ray of 10/28/2015 demonstrates retraction of the endotracheal   tube with appropriate positioning.       2. Stable enlargement of the cardiac silhouette and pulmonary vascular   congestion. Possible small bilateral pleural effusions.           Narrative:  AP CHEST, PORTABLE       INDICATION: Status post intubation       COMPARISON: Prior chest x-rays, most recent 10/16/2015. Subsequent chest x-ray   10/28/2015        FINDINGS:  EKG leads overlie the patient. Interval intubation, distal tip   projects at the level of the carina, direct it towards the right mainstem   bronchus. Dual lumen central venous catheter is in stable position. Bilateral   axillary/left brachial stents are again noted.       Stable enlargement of the cardiac silhouette.  Mild prominence of the   bronchovascular markings, greatest at the lung bases. Possible trace bilateral   pleural effusions as evidenced by blunting of the costophrenic sulci. No   pneumothorax      No acute osseous abnormalities are identified.                 IMPRESSION:   ?? PEA cardiac arrest s/p femoral temporary dialysis cath placement, likely d/t hyperkalemia  ?? Acute respiratory failure 2' to above, liberated from mechanical ventilation 10/29/15  ?? ESRD on HD ??  ?? Non-functioning HD catheters, has had 3 catheters placed throughout his hospital stay and all have become broken d/t patient tampering  ?? Electrolyte derangement, improved  ?? HIV/AIDS with pancytopenia, not receiving treatment at this time  ?? Hx HTN  ?? Hx AVN of R hip s/p replacement  ?? Hx malingering   ?? Code status: full??      PLAN:   ?? CVS:  Holding  home lisinopril and clonidine at this time, currently normotensive but may need to restart in becoming hypertensive.  ECHO from 10/31 reviewed, EF 65%  ?? Respiratory: sPO2 sats >97% on NC, can taper off today.   ?? ID: all cx are NGTD. Recent diagnosis of HIV and has not followed up with ID, not on HAART. ID consulted.   ?? Hematology/Oncology: Pancytopenia in unchanged from baseline.  Pt reports a hx of sickle cell but electrophoresis is negative.    ?? Renal: IHD on MWF per nephrology. lytes are acceptable  ?? GI/GU: prn zofran for nausea.  Will check LFTs tomorrow AM for abdominal pain.   ?? Endocrine:  Consistent glucose in the 80s, will continue D10 for now.  ?? Neurology/Pain/Sedation:  Prn dilaudid and percocet.   ?? Prophylaxis: GI Prophylaxis with pepcid. DVT Prophylaxis with SCDs.  ?? Discussed in interdisciplinary rounds  ?? OK to transfer to telemetry          The patient is:  acutely ill Risk of deterioration:  moderate     critically ill   high     See orders for details    My assessment/plan was discussed with:  Nursing PT/OT    Respiratory therapy Dr. Zollie Pee    Family      Total critical care time exclusive of procedures 35  minutes    Willaim Bane, [place]  10/29/2015

## 2015-10-29 NOTE — Progress Notes (Signed)
Pt transported back to unit, Emergency planning/management officerpolice officer, security guard present, transport personnel

## 2015-10-29 NOTE — Progress Notes (Signed)
Phone report completed with Legrand PittsAmy Burge, RN; transportation requested online; police officer present, security guard present; pt in and states pain is 3/10 post dilaudid/zofran

## 2015-10-29 NOTE — Op Note (Signed)
[facility]  OPERATIVE REPORTS    Name:  Tyler BoehringerSLAUGHTER, Tyler  MR#:  [id number]  DOB:  10/14/1981  Account #:  [id number]  Date of Adm:  10/16/2015  Date of Surgery:  10/29/2015      ATTENDING: Margaretmary DysMarc Tynesha Free, MD.    PREOPERATIVE DIAGNOSIS: End-stage renal disease with needed  dialysis access.    POSTOPERATIVE DIAGNOSIS: End-stage renal disease with needed  dialysis access.    PROCEDURES PERFORMED:  1. Right upper extremity fistulogram.  2. Balloon angioplasty of in-stent stenosis.  3. Occlusion of the brachiocephalic stents.  4. Balloon angioplasty of the superior vena cava.    CULTURES: None    SPECIMENS:  None.    DRAINS: None.    ESTIMATED BLOOD LOSS: Less than 50 mL.    INDICATIONS FOR THE PROCEDURE: The patient is a 34 year old  gentleman with end-stage renal disease in need of improvement of his  dialysis access. The patient was given the risks and benefits of the  procedure including but not limited to bleeding, infection, damage to  adjacent structures, MI, stroke, and death, as well as loss of access.  The patient was understanding of all the risks and underwent the  procedure.    OPERATIVE FINDINGS:  1. 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 There is no  evidence of any narrowing at the brachial vein. The brachial vein and  axillary vein are both patent without stenosis and again are quite large.  2. The subclavian vein is patent, without stenosis and is quite large.  The patient does have a stents that jail off his internal jugular vein on  the right side, goes into the brachiocephalic and those stents are open  proximally, but do appear to be completely occluded more distally.  The SVC is patent. The patient does have a left-sided permanent  dialysis catheter appropriately placed.    DESCRIPTION OF PROCEDURE: The patient was correctly identified  in the precatheterization area and taken to  catheterization lab in stable  condition. The patient had pre-incision time-out prior to any incision.  The patient was prepped and draped in normal sterile fashion  according to CDC guidelines aseptic technique. We then were able to  feel the arterial inflow, numb him up appropriately using 1% lidocaine,  took a single wall entry needle and gained access into the fistula. Once  we had positive blood return, a wire was then placed. Small skin  incision was created using 11 blade and a 6-French short sheath was  then placed. Fistulogram was performed showing the above findings.  We then upsized to a 7-French sheath and using a stiff angled  Glidewire and Bernstein catheter, we were able to gain access into the  SVC then down into the IVC. We then were able to balloon it with a 10  x 40 balloon within the SVC and the proximal stent. We then did the in-  stent stenosis as we were ballooning. We did shatter the balloon at  around 25 atmospheres of pressure. Repeat fistulogram did show  resolution of the stenosis and occlusion with improved flow going to the  heart. We then decided to conclude the case. We removed all wires,  catheters, and sheaths, and closed the sheath insertion site using a 3-  0 nylon suture in a Z stitch formation.        Margaretmary DysMARC Jomarion Mish, MD    EC /  SM  D:  10/29/2015   12:33  T:  10/29/2015   16:13  Job #:  [id number]

## 2015-10-29 NOTE — Procedures (Signed)
[facility]  *** FINAL REPORT ***    Name: [name], Tyler  MRN: [id number]    Inpatient  DOB: 27 Nov 1981  HIS Order #: [id number]  TRAKnet Visit #: [id number]  Date: 29 Oct 2015    TYPE OF TEST: Dialysis Access Duplex    REASON FOR TEST    Graft:-  Summary:   Left arm straight fistula with ptfe  Op. Date:  12/20/1999  Surgeon:   Unknown    Results:-            Velocity  Ratio  Flow Volume Stenosis            --------  -----  ----------- --------  Inflow:      8.0  Proximal:    8.0      1.0  Mid-graft:   9.0      1.1  Distal:      8.0      0.9  Outflow:    32.0      4.0      N/A    Mean Flow Volume:    INTERPRETATION/FINDINGS  Duplex images were obtained using 2-D gray scale, color flow and  spectral doppler analysis.  Note:  Patient stated he was informed the right arm will no longer be  considered as an option for dialysis access, therefore, the left arm  was assessed on today's exam.  Duplex exam of the dialysis access in the left arm reveals :  1. No significant flow in the graft.  2. Velocities in the inflow artery and within the graft are <10 cm/s.    ADDITIONAL COMMENTS    I have personally reviewed the data relevant to the interpretation of  this  study.    TECHNOLOGIST: Denny PeonJennifer Tempesco, RVT  Signed: 10/29/2015 11:30 AM    PHYSICIAN: [place] Ronan Duecker, D.O.  Signed: 10/29/2015 04:08 PM

## 2015-10-29 NOTE — Other (Signed)
TRANSFER - OUT REPORT:    Verbal report given to [facility](name) on Cal[employer] CorporationElbert C Joyce  being [facility] for routine progression of care       Report consisted of patient???s Situation, Background, Assessment and   Recommendations(SBAR).     Information from the following report(s) SBAR, Procedure Summary, Intake/Output and MAR was reviewed with the receiving nurse.    Lines:   Double Lumen Hickman 09/04/13 Left Other(comment) (Active)   Central Line Being Utilized Yes 10/29/2015  8:00 AM   Criteria for Appropriate Use Dialysis/apheresis 10/29/2015  8:00 AM   Site Assessment Clean, dry, & intact 10/29/2015  8:00 AM   Infiltration Assessment 0 10/29/2015  8:00 AM   Affected Extremity/Extremities Color distal to insertion site pink (or appropriate for race);Pulses palpable;Range of motion performed 10/29/2015  8:00 AM   Date of Last Dressing Change 10/27/15 10/27/2015  8:00 PM   Dressing Status Clean, dry, & intact 10/29/2015  8:00 AM   Dressing Type 4 X 4 10/29/2015  8:00 AM   Proximal Hub Color/Line Status Blue;Flushed 10/27/2015  6:00 PM   Positive Blood Return (Medial Site) Yes 10/27/2015  6:00 PM   Distal Hub Color/Line Status Red;Flushed 10/27/2015  6:00 PM   Positive Blood Return (Lateral Site) Yes 10/27/2015  6:00 PM   Alcohol Cap Used No 10/27/2015  6:00 PM       Peripheral IV 10/27/15 Right Other(comment) (Active)   Site Assessment Clean, dry, & intact 10/29/2015  8:00 AM   Phlebitis Assessment 0 10/29/2015  8:00 AM   Infiltration Assessment 0 10/29/2015  8:00 AM   Dressing Status Clean, dry, & intact 10/29/2015  8:00 AM   Dressing Type Transparent;Tape 10/29/2015  8:00 AM   Hub Color/Line Status Blue;Capped 10/29/2015  8:00 AM   Action Taken Other (comment) 10/27/2015  8:00 PM   Alcohol Cap Used No 10/27/2015  8:00 PM        Opportunity for questions and clarification was provided.      Patient transported with:   [employer]

## 2015-10-29 NOTE — Progress Notes (Addendum)
Received pt from ICU via bed to room 366. No distress noted denies complaints. Guard at bedside. D10 infusing vial upper rt arm/shoulder area at 50 cc/hr. Handcuff to left wrist area.     1120 pt transported to cath lab for revision of right arm shunt. Guard and security present. Transported by bed with cath tech. No distress noted at time of transport.    1440 received pt back to room from cath lab. Rt arm with positive thrill and bruit. Elevated on pillow. Asleep but arouses easily. Guard remains at bedside.     1945. D10 continued at 50 cc/hr. Pt denies complaints at this time. Rt arm remains with good thrill. Guard at bedside. No distress noted.

## 2015-10-29 NOTE — Other (Signed)
TRANSFER - IN REPORT:    [facility] on [name]  being [facility] for routine progression of care      Report consisted of patient???s Situation, Background, Assessment and   Recommendations(SBAR).     Information from the following report(s) SBAR, Kardex, OR Summary, Intake/Output, MAR and Cardiac Rhythm SR was reviewed with the receiving nurse.    Opportunity for questions and clarification was provided.      Assessment completed upon patient???s arrival to unit and care assumed.

## 2015-10-29 NOTE — Op Note (Signed)
[facility] [place] [facility]  OPERATIVE REPORTS    Name:  [name], Gabreal  MR#:  [id number]  DOB:  06-14-1981  Account #:  [id number]  Date of Adm:  10/16/2015  Date of Surgery:  10/29/2015      ATTENDING: Margaretmary DysMarc Devonna Oboyle, MD.    PREOPERATIVE DIAGNOSIS: End-stage renal disease with needed  dialysis access.    POSTOPERATIVE DIAGNOSIS: End-stage renal disease with needed  dialysis access.    PROCEDURES PERFORMED:  1. Right upper extremity fistulogram.  2. Balloon angioplasty of in-stent stenosis.  3. Occlusion of the brachiocephalic stents.  4. Balloon angioplasty of the superior vena cava.    CULTURES: None    SPECIMENS:  None.    DRAINS: None.    ESTIMATED BLOOD LOSS: Less than 50 mL.    INDICATIONS FOR THE PROCEDURE: The patient is a 34 year old  gentleman with end-stage renal disease in need of improvement of his  dialysis access. The patient was given the risks and benefits of the  procedure including but not limited to bleeding, infection, damage to  adjacent structures, MI, stroke, and death, as well as loss of access.  The patient was understanding of all the risks and underwent the  procedure.    OPERATIVE FINDINGS:  1. 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 There is no  evidence of any narrowing at the brachial vein. The brachial vein and  axillary vein are both patent without stenosis and again are quite large.  2. The subclavian vein is patent, without stenosis and is quite large.  The patient does have a stents that jail off his internal jugular vein on  the right side, goes into the brachiocephalic and those stents are open  proximally, but do appear to be completely occluded more distally.  The SVC is patent. The patient does have a left-sided permanent  dialysis catheter appropriately placed.    DESCRIPTION OF PROCEDURE: The patient was correctly identified   in the precatheterization area and taken to catheterization lab in stable  condition. The patient had pre-incision time-out prior to any incision.  The patient was prepped and draped in normal sterile fashion  according to CDC guidelines aseptic technique. We then were able to  feel the arterial inflow, numb him up appropriately using 1% lidocaine,  took a single wall entry needle and gained access into the fistula. Once  we had positive blood return, a wire was then placed. Small skin  incision was created using 11 blade and a 6-French short sheath was  then placed. Fistulogram was performed showing the above findings.  We then upsized to a 7-French sheath and using a stiff angled  Glidewire and Bernstein catheter, we were able to gain access into the  SVC then down into the IVC. We then were able to balloon it with a 10  x 40 balloon within the SVC and the proximal stent. We then did the in-  stent stenosis as we were ballooning. We did shatter the balloon at  around 25 atmospheres of pressure. Repeat fistulogram did show  resolution of the stenosis and occlusion with improved flow going to the  heart. We then decided to conclude the case. We removed all wires,  catheters, and sheaths, and closed the sheath insertion site using a 3-  0 nylon suture in a Z stitch formation.        Margaretmary DysMARC Alaynah Schutter, MD    EC /  SM  D:  10/29/2015   12:33  T:  10/29/2015   16:13  Job #:  [id number]

## 2015-10-29 NOTE — Progress Notes (Signed)
Progress Note    Tyler Ballard  34 y.o.      Admit Date: 10/16/2015  Patient Active Problem List   Diagnosis Code   ??? 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 ESRD (end stage renal disease) on dialysis (HCC) N18.6, Z99.2   ??? HTN (hypertension) I10   ??? Arthralgia M25.50   ??? Persistent vomiting R11.10   ??? Sickle cell crisis (HCC) D57.00   ??? Nausea & vomiting R11.2   ??? Pancytopenia (HCC) D61.818   ??? ARF (acute renal failure) (HCC) N17.9   ??? Malingering Z76.5   ??? Do not give narcotics IMO0001   ??? Pulmonary edema J81.1   ??? Drug-seeking behavior Z76.5   ??? Noncompliance of patient with renal dialysis (HCC) Z91.15   ??? Positive blood culture R78.81   ??? Clotted dialysis access [facility]) T82.49XA   ??? Dialysis patient (HCC) Z99.2   ??? Secondary hyperparathyroidism (HCC) N25.81   ??? ESRD on dialysis (HCC) N18.6, Z99.2   ??? Renal failure N19   ??? HIV (human immunodeficiency virus infection) (HCC) Z21   ??? ESRD (end stage renal disease) (HCC) N18.6   ??? Secondary hyperparathyroidism of renal origin (HCC) N25.81           Subjective:     Patient feels good,extubated, back from surgery , not  Clear exactly what vascular surgeery he had today, has right groin HD catheter, (R) arm AVF has good thrill & bruit, needs Fistulogram.      A comprehensive review of systems was negative except for that written in the History of Present Illness.    Objective:     Visit Vitals   ??? BP 113/51   ??? Pulse 100   ??? Temp 99.7 ??F (37.6 ??C)   ??? Resp 10   ??? Ht 5\' 7"  (1.702 m)   ??? Wt 101.6 kg (223 lb 15.8 oz)   ??? SpO2 100%   ??? BMI 35.08 kg/m2         Intake/Output Summary (Last 24 hours) at 10/29/15 1613  Last data filed at 10/29/15 1100   Gross per 24 hour   Intake    880 ml   Output      0 ml   Net    880 ml       Current Facility-Administered Medications   Medication Dose Route Frequency Provider Last Rate Last Dose   ??? sodium chloride 0.9 % in dextrose 10% 1,040 mL infusion   IntraVENous  CONTINUOUS January-Jill Ogoy V, PA-C 50 mL/hr at 10/29/15 0244     ??? doxercalciferol (HECTOROL) 4 mcg/2 mL injection 1 mcg  1 mcg IntraVENous Q MON, WED & FRI Loreli Dollar, MD   1 mcg at 10/28/15 1239   ??? albuterol-ipratropium (DUO-NEB) 2.5 MG-0.5 MG/3 ML  3 mL Nebulization Q6H PRN Terrance Mass, MD       ??? nicotine (NICODERM CQ) 14 mg/24 hr patch 1 Patch  1 Patch TransDERmal DAILY Estell Harpin Maka, PA       ??? phenol throat spray (CHLORASEPTIC) 1 Spray  1 Spray Oral PRN Miranda Tamok, PA       ??? oxyCODONE-acetaminophen (PERCOCET 7.5) 7.5-325 mg per tablet 1 Tab  1 Tab Oral Q6H PRN Willaim Bane, PA   1 Tab at 10/28/15 1807   ??? sodium chloride (NS) flush 5-10 mL  5-10 mL IntraVENous Q8H Miquel Dunn, CRNA   10 mL at 10/29/15 1610   ??? sodium chloride (  NS) flush 5-10 mL  5-10 mL IntraVENous PRN Miquel Dunn, CRNA   10 mL at 10/28/15 1510   ??? insulin lispro (HUMALOG) injection   SubCUTAneous Q6H Miranda Tamok, PA   Stopped at 10/28/15 0000   ??? dextrose (D50W) injection syrg 12.5-25 g  25-50 mL IntraVENous PRN Miranda Tamok, PA   25 g at 10/28/15 0013   ??? epoetin alfa (EPOGEN;PROCRIT) injection 10,000 Units  10,000 Units IntraVENous Q MON, WED & Serafina Royals, MD   Stopped at 10/28/15 1700   ??? famotidine (PF) (PEPCID) 20 mg in sodium chloride 0.9 % 10 mL injection  20 mg IntraVENous Q24H Miranda Tamok, PA   20 mg at 10/28/15 2300   ??? promethazine (PHENERGAN) suppository 25 mg  25 mg Rectal Q8H PRN Thomas P. Splan, MD       ??? heparin (porcine) 100 unit/mL injection 300 Units  300 Units InterCATHeter PRN Chanda Busing, MD       ??? 0.9% sodium chloride infusion 250 mL  250 mL IntraVENous PRN Loreli Dollar, MD       ??? promethazine (PHENERGAN) tablet 25 mg  25 mg Oral Q6H PRN Woodward Ku, MD   25 mg at 10/28/15 2016   ??? traZODone (DESYREL) tablet 50 mg  50 mg Oral QHS PRN Woodward Ku, MD   50 mg at 10/28/15 2017   ??? cloNIDine HCl (CATAPRES) tablet 0.1 mg  0.1 mg Oral Q2H PRN Woodward Ku, MD       ??? hydrALAZINE (APRESOLINE) 20 mg/mL injection 10 mg  10 mg IntraVENous Q6H PRN Woodward Ku, MD       ??? diphenhydrAMINE (BENADRYL) capsule 25 mg  25 mg Oral Q6H PRN Betsy Pries, MD   25 mg at 10/28/15 1653   ??? ammonium lactate (LAC-HYDRIN) 12 % lotion   Topical BID Betsy Pries, MD   Stopped at 10/28/15 0900   ??? sodium chloride (NS) flush 5-10 mL  5-10 mL IntraVENous PRN Alben Deeds, CRNA       ??? naloxone (NARCAN) injection 0.1 mg  0.1 mg IntraVENous PRN Alben Deeds, CRNA       ??? glucose chewable tablet 16 g  4 Tab Oral PRN Alben Deeds, CRNA       ??? glucagon (GLUCAGEN) injection 1 mg  1 mg IntraMUSCular PRN Alben Deeds, CRNA       ??? sodium citrate 4 gram /100 mL (4 %) 0.08 g  2 mL Does Not Apply DIALYSIS PRN Loreli Dollar, MD       ??? calcium acetate (PHOSLO) capsule 667 mg  1 Cap Oral TID WITH MEALS Wanda Plump, MD   Stopped at 10/28/15 1200   ??? folic acid (FOLVITE) tablet 1 mg  1 mg Oral DAILY Wanda Plump, MD   Stopped at 10/29/15 0900   ??? hydroxyurea (HYDREA) chemo cap 500 mg  500 mg Oral DAILY Wanda Plump, MD   Stopped at 10/29/15 0900   ??? sodium citrate 4 gram /100 mL (4 %) 0.08 g  2 mL Hemodialysis DIALYSIS TUE, THU & SAT Loreli Dollar, MD   Stopped at 10/29/15 0900   ??? ondansetron (ZOFRAN) injection 4 mg  4 mg IntraVENous Q6H PRN Ouida Sills, MD   4 mg at 10/29/15 1326        Physical Exam:     Physical Exam:   General:  Alert, cooperative, no distress, appears stated age.  Neck: Supple, symmetrical, trachea midline, no adenopathy, thyroid: no enlargement/tenderness/nodules, no carotid bruit and no JVD.   Lungs:   Clear to auscultation bilaterally.   Heart:  Regular rate and rhythm, S1, S2 normal, no murmur, click, rub or gallop.   Abdomen:   Soft, non-tender. Bowel sounds normal. No masses,  No organomegaly.   Extremities: Extremities normal, atraumatic, no cyanosis or edema,left arm  is swollen with non functional AVG, has AVF on right arm., has (R) groin HD catheter.   Skin: Skin color, texture, turgor normal. No rashes or lesions         Data Review:    CBC w/Diff    Recent Labs      10/29/15   0250  10/28/15   0310  10/27/15   0553   WBC  2.2*  2.2*  2.0*   RBC  2.80*  3.10*  2.38*   HGB  8.3*  9.3*  7.0*   HCT  25.5*  27.4*  22.0*   MCV  91.1  88.4  92.4   MCH  29.6  30.0  29.4   MCHC  32.5  33.9  31.8   RDW  18.9*  18.2*  19.0*    Recent Labs      10/29/15   0250  10/28/15   0310  10/27/15   0553   MONOS  EOS  3  3  6*   BASOS  RDW  18.9*  18.2*  19.0*        Comprehensive Metabolic Profile    Recent Labs      10/29/15   0250  10/28/15   0310  10/27/15   0937   NA  138  134*  135*   K  3.9  4.3  6.5*   CL  101  101  100   CO2  BUN  28*  50*  73*   CREA  4.70*  5.98*  8.73*    Recent Labs      10/29/15   0250  10/28/15   0310  10/27/15   0937  10/27/15   0553   CA  8.0*  7.6*  7.5*  7.1*   PHOS  3.7  3.5   --   5.6*              Lab Results   Component Value Date/Time    GFR EST AA 17 10/29/2015 02:50 AM    GFR EST NON-AA 14 10/29/2015 02:50 AM    CREATININE (POC) 7.2 09/04/2013 07:35 PM    CREATININE 4.70 10/29/2015 02:50 AM    BUN 28 10/29/2015 02:50 AM    BUN (POC) 43 09/04/2013 07:35 PM    SODIUM (POC) 140 09/04/2013 07:35 PM    SODIUM 138 10/29/2015 02:50 AM    POTASSIUM 3.9 10/29/2015 02:50 AM    POTASSIUM (POC) 4.5 09/04/2013 07:35 PM    CHLORIDE (POC) 105 09/04/2013 07:35 PM    CHLORIDE 101 10/29/2015 02:50 AM    CO2 29 10/29/2015 02:50 AM         Imaging:     Procedures/imaging: see electronic medical records for all procedures, Xrays and details which were not copied into this note but were reviewed prior to   taking my decision.  Impression:       Active Hospital Problems    Diagnosis Date Noted   ???  Secondary hyperparathyroidism of renal origin (HCC) 10/18/2015   ??? HIV (human immunodeficiency virus infection) (HCC) 10/17/2015    ??? Renal failure 10/16/2015   ??? ESRD (end stage renal disease) on dialysis (HCC) 07/06/2012   ??? HTN (hypertension) 07/06/2012   ??? Sickle cell anemia with pain (HCC) 07/05/2012            Plan:     Await Fistulo gram of (R) arm. Will dialyze him tomorrow & will use right grin catheter.    Loreli DollarMosta Camdyn Laden, MD

## 2015-10-29 NOTE — Progress Notes (Signed)
Vascular study completed.

## 2015-10-29 NOTE — Progress Notes (Signed)
Hospitalist Progress Note    Patient: Tyler Ballard MRN: [id number]  CSN: [id number]    Date of Birth: 10/28/1981  Age: 34 y.o.  Sex: male    DOA: 10/16/2015 LOS:  LOS: 13 days          Feeling fine.  Some groin and chest pain.  No nausea or vomiting.  Itching present per patient.     Assessment/Plan     1. Brief cardiac arrest possible due to Hyperkalemia: s/p resuscitation.    2. Hyperkalemia: s/p kayexalate, better with HD   3. ESRD / HD per nephrology.  HD per renal team.  Talked to dr. Ardelle ParkHaque,  4. Acute resp failure: s/p intubation in OR, s/p extubation yesterday. On NC. Talked to dr. Rodena MedinAmit Hyde Sires.   5. HIV/AIDS: asymptomatic, no infections: ID on case. Hold bactrim due to # 2. Can resume once HD cath issue is resolved.   6. Non-functioning HD access - s/p HD catheter X 3 placement by vascular and all had break and cant use them, he had 3 bleeding episodes and RRT's. vas sx couldn't open up clotted graft. Pt totally had 4 cath leak and bleeding this hospitalization. Vas to follow for R arm fistula.    7. Pancytopenia in the setting of HIV.   8. Acute blood loss Anemia due to bleeding from broken  HD cath: s/p 5 units transfused     9. ? Sickle cell anemia: had neg Hb electrophoresis in 2014. negative Hb electrophoresis.   10. HTN: BP stable, off HTN med's       Case discussed with:  Patient  Family  Nursing  Case Management  DVT Prophylaxis:  Lovenox  Hep SQ  SCDs  Coumadin   On Heparin gtt    Vital signs/Intake and Output:  Visit Vitals   ??? BP 113/51   ??? Pulse 100   ??? Temp 99.7 ??F (37.6 ??C)   ??? Resp 10   ??? Ht 5\' 7"  (1.702 m)   ??? Wt 101.6 kg (223 lb 15.8 oz)   ??? SpO2 100%   ??? BMI 35.08 kg/m2       Gen: AA. Follows commands.   Neck: No JVD  HEENT: PERRLA.  No icterus  CVS: RRR  RS: CTA bilaterally.   GI: Soft nt nd nabs. R groin HD cath noted   CHEST: L chest HD cath site dressing clean, no bleeding.   Neuro: awake. Follows commands. Moves all extremities    Medications Reviewed      Labs: Results:        Chemistry Recent Labs      10/29/15   0250  10/28/15   0310  10/27/15   0937   GLU  89  98  91   NA  138  134*  135*   K  3.9  4.3  6.5*   CL  101  101  100   CO2  29  23  26    BUN  28*  50*  73*   CREA  4.70*  5.98*  8.73*   CA  8.0*  7.6*  7.5*   AGAP  8  10  9    BUCR  6*  8*  8*      CBC w/Diff Recent Labs      10/29/15   0250  10/28/15   0310  10/27/15   0553   WBC  2.2*  2.2*  2.0*   RBC  2.80*  3.10*  2.38*   HGB  8.3*  9.3*  7.0*   HCT  25.5*  27.4*  22.0*   PLT  73*  94*  77*   GRANS  64  61  49   LYMPH  23  26  34   EOS  3  3  6*      Cardiac Enzymes Recent Labs      10/28/15   1030  10/28/15   0310   CPK  81  90   CKND1  1.0  1.7      Coagulation Recent Labs      10/29/15   0250  10/28/15   0310   PTP  15.4*  15.3*   INR  1.3*  1.3*       Lipid Panel Lab Results   Component Value Date/Time    CHOLESTEROL, TOTAL 85 11/29/2012 09:36 AM    HDL CHOLESTEROL 33 11/29/2012 09:36 AM    LDL, CALCULATED 41.2 11/29/2012 09:36 AM    VLDL, CALCULATED 10.8 11/29/2012 09:36 AM    TRIGLYCERIDE 54 11/29/2012 09:36 AM    CHOL/HDL RATIO 2.6 11/29/2012 09:36 AM      BNP No results for input(s): BNPP in the last 72 hours.   Liver Enzymes No results for input(s): TP, ALB, TBIL, AP, SGOT, GPT in the last 72 hours.    No lab exists for component: DBIL   Thyroid Studies Lab Results   Component Value Date/Time    TSH 3.63 10/28/2015 03:10 AM        Procedures/imaging: see electronic medical records for all procedures/Xrays and details which were not copied into this note but were reviewed prior to creation of Plan.-

## 2015-10-29 NOTE — Other (Incomplete)
IDR/SLIDR Summary          Patient: [name] MRN: [id number]    Age: 34 y.o.     Birthdate: 02-26-1981 Room/Bed: 366/01   Admit Diagnosis: ESRD (end stage renal disease) (HCC) [N18.6]  Principal Diagnosis: <principal problem not specified>     Goals: comfort, pain and nausea mgmt and surgery today.  Readmission: NO  Quality Measure: Not applicable  VTE Prophylaxis: Mechanical SCD's  Influenza Vaccine screening completed? YES  Pneumococcal Vaccine screening completed? YES  Mobility needs: Yes   Nutrition plan:Yes  Consults:vascular, nephrology    Financial concerns:No  Escalated to CM? NO  RRAT Score: 19   Interventions:{Intervention:21348}  Testing due for pt today? {YES/NO:18482}  LOS: 13 days Expected length of stay 12 days  Discharge plan: back to correctional facility   PCP: Phys Other, MD  Transportation needs: No    Days before discharge:two or more days before discharge   Discharge disposition: correctional facility care    Signed:     Dillard Essexmy R Ceazia Harb, RN  10/29/2015  10:39 AM

## 2015-10-29 NOTE — Interval H&P Note (Signed)
H&P Update:  Tyler Ballard was seen and examined.  History and physical has been reviewed. Significant clinical changes have occurred as noted:  Had right arm fistula that is patent will attempt fistulagram to make sure its working fully.    Signed By: Oneita HurtMarc A Janila Arrazola, MD     October 29, 2015 11:19 AM

## 2015-10-30 LAB — HEPATIC FUNCTION PANEL
A-G Ratio: 0.4 — ABNORMAL LOW (ref 0.8–1.7)
ALT (SGPT): <6 U/L — ABNORMAL LOW (ref 16–61)
AST (SGOT): 19 U/L (ref 15–37)
Albumin: 1.7 g/dL — ABNORMAL LOW (ref 3.4–5.0)
Alk. phosphatase: 116 U/L (ref 45–117)
Bilirubin, direct: 0.3 MG/DL — ABNORMAL HIGH (ref 0.0–0.2)
Bilirubin, total: 0.5 MG/DL (ref 0.2–1.0)
Globulin: 4.6 g/dL — ABNORMAL HIGH (ref 2.0–4.0)
Protein, total: 6.3 g/dL — ABNORMAL LOW (ref 6.4–8.2)

## 2015-10-30 LAB — METABOLIC PANEL, BASIC
Anion gap: 7 mmol/L (ref 3.0–18)
BUN/Creatinine ratio: 7 — ABNORMAL LOW (ref 12–20)
BUN: 43 MG/DL — ABNORMAL HIGH (ref 7.0–18)
CO2: 27 mmol/L (ref 21–32)
Calcium: 7.4 MG/DL — ABNORMAL LOW (ref 8.5–10.1)
Chloride: 104 mmol/L (ref 100–108)
Creatinine: 6.28 MG/DL — ABNORMAL HIGH (ref 0.6–1.3)
GFR est AA: 12 mL/min/{1.73_m2} — ABNORMAL LOW (ref >60)
GFR est non-AA: 10 mL/min/{1.73_m2} — ABNORMAL LOW (ref >60)
Glucose: 112 mg/dL — ABNORMAL HIGH (ref 74–99)
Potassium: 4 mmol/L (ref 3.5–5.5)
Sodium: 138 mmol/L (ref 136–145)

## 2015-10-30 LAB — CBC WITH AUTOMATED DIFF
ABS. BASOPHILS: 0 10*3/uL (ref 0.0–0.1)
ABS. EOSINOPHILS: 0 10*3/uL (ref 0.0–0.4)
ABS. LYMPHOCYTES: 0.6 10*3/uL — ABNORMAL LOW (ref 0.9–3.6)
ABS. MONOCYTES: 0.4 10*3/uL (ref 0.05–1.2)
ABS. NEUTROPHILS: 2.2 10*3/uL (ref 1.8–8.0)
BASOPHILS: 0 % (ref 0–2)
EOSINOPHILS: 1 % (ref 0–5)
HCT: 24.1 % — ABNORMAL LOW (ref 36.0–48.0)
HGB: 7.6 g/dL — ABNORMAL LOW (ref 13.0–16.0)
LYMPHOCYTES: 20 % — ABNORMAL LOW (ref 21–52)
MCH: 29.7 PG (ref 24.0–34.0)
MCHC: 31.5 g/dL (ref 31.0–37.0)
MCV: 94.1 FL (ref 74.0–97.0)
MONOCYTES: 11 % — ABNORMAL HIGH (ref 3–10)
MPV: 10.3 FL (ref 9.2–11.8)
NEUTROPHILS: 68 % (ref 40–73)
PLATELET: 61 10*3/uL — ABNORMAL LOW (ref 135–420)
RBC: 2.56 M/uL — ABNORMAL LOW (ref 4.70–5.50)
RDW: 19.1 % — ABNORMAL HIGH (ref 11.6–14.5)
WBC: 3.2 10*3/uL — ABNORMAL LOW (ref 4.6–13.2)

## 2015-10-30 LAB — GLUCOSE, POC
Glucose (POC): 82 mg/dL (ref 70–110)
Glucose (POC): 93 mg/dL (ref 70–110)
Glucose (POC): 113 mg/dL — ABNORMAL HIGH (ref 70–110)
Glucose (POC): 116 mg/dL — ABNORMAL HIGH (ref 70–110)

## 2015-10-30 LAB — CULTURE, RESPIRATORY/SPUTUM/BRONCH W GRAM STAIN
GRAM STAIN: <10
GRAM STAIN: NONE SEEN

## 2015-10-30 LAB — PROTHROMBIN TIME + INR
INR: 1.7 — ABNORMAL HIGH (ref 0.8–1.2)
Prothrombin time: 19 s — ABNORMAL HIGH (ref 11.5–15.2)

## 2015-10-30 LAB — PHOSPHORUS: Phosphorus: 3.1 MG/DL (ref 2.5–4.9)

## 2015-10-30 MED ORDER — DOXERCALCIFEROL 4 MCG/2 ML IV SOLN
4 mcg/2 mL | INTRAVENOUS | Status: DC
Start: 2015-10-30 — End: 2015-11-03
  Administered 2015-10-30 – 2015-11-02 (×2): via INTRAVENOUS

## 2015-10-30 MED ORDER — EPOETIN ALFA 3,000 UNIT/ML IJ SOLN
3000 unit/mL | INTRAMUSCULAR | Status: DC
Start: 2015-10-30 — End: 2015-11-03
  Administered 2015-10-30 – 2015-11-02 (×2): via INTRAVENOUS

## 2015-10-30 MED FILL — HECTOROL 4 MCG/2 ML INTRAVENOUS SOLUTION: 4 mcg/2 mL | INTRAVENOUS | Qty: 2

## 2015-10-30 MED FILL — SODIUM CITRATE 4 GRAM/100 ML (4 %) SOLUTION: 4 gram /100 mL ( %) | Qty: 2

## 2015-10-30 MED FILL — NICOTINE 14 MG/24 HR DAILY PATCH: 14 mg/24 hr | TRANSDERMAL | Qty: 1

## 2015-10-30 MED FILL — HYDROXYUREA 500 MG CAPSULE: 500 mg | ORAL | Qty: 1

## 2015-10-30 MED FILL — DEXTROSE 10% IN WATER (D10W) IV: 10 % | INTRAVENOUS | Qty: 1000

## 2015-10-30 MED FILL — PROCRIT 10,000 UNIT/ML INJECTION SOLUTION: 10000 unit/mL | INTRAMUSCULAR | Qty: 1

## 2015-10-30 MED FILL — OXYCODONE-ACETAMINOPHEN 7.5 MG-325 MG TAB: ORAL | Qty: 1

## 2015-10-30 MED FILL — FAMOTIDINE (PF) 20 MG/2 ML IV: 20 mg/2 mL | INTRAVENOUS | Qty: 2

## 2015-10-30 MED FILL — CALCIUM ACETATE 667 MG CAP: 667 mg | ORAL | Qty: 1

## 2015-10-30 NOTE — Progress Notes (Signed)
Hospitalist Progress Note    Patient: Tyler Ballard MRN: [id number]  CSN: [id number]    Date of Birth: 02/20/1981  Age: 34 y.o.  Sex: male    DOA: 10/16/2015 LOS:  LOS: 14 days          Patient seen in HD, in NAD, awake, follows commands    Assessment/Plan     1. Brief cardiac arrest possible due to Hyperkalemia: s/p resuscitation.    2. Hyperkalemia: s/p kayexalate, better with HD   3. ESRD / HD per nephrology.  HD as per Nephro  4. Acute resp failure: s/p intubation in OR, s/p extubation   5. HIV/AIDS: asymptomatic, no infections:resume HD if K remains stable   6. Non-functioning HD access - s/p HD catheter X 3 placement by vascular and all had break and cant use them, he had 3 bleeding episodes and RRT's.  Pt totally had 4 cath leak and bleeding this hospitalization.   7. Pancytopenia in the setting of HIV.   8. Acute blood loss Anemia due to bleeding from broken  HD cath: s/p 5 units transfused      9. HTN: BP stable, off HTN med's   D/w patient      Case discussed with:  Patient  Family  Nursing  Case Management  DVT Prophylaxis:  Lovenox  Hep SQ  SCDs  Coumadin   On Heparin gtt    Vital signs/Intake and Output:  Visit Vitals   ??? BP 96/65 (BP 1 Location: Right arm, BP Patient Position: At rest)   ??? Pulse 88   ??? Temp 97.7 ??F (36.5 ??C)   ??? Resp 19   ??? Ht 5\' 7"  (1.702 m)   ??? Wt 100.9 kg (222 lb 7.1 oz)   ??? SpO2 92%   ??? BMI 34.84 kg/m2       General:  Awake, alert  Cardiovascular:  S1S2+, RRR  Pulmonary:  CTA b/l  GI:  Soft, BS+, NT, ND  Extremities:  No edema      Medications Reviewed      Labs: Results:       Chemistry Recent Labs      10/30/15   1345  10/30/15   0423  10/29/15   0250  10/28/15   0310   GLU  112*   --   89  98   NA  138   --   138  134*   K  4.0   --   3.9  4.3   CL  104   --   101  101   CO2  27   --   29  23   BUN  43*   --   28*  50*   CREA  6.28*   --   4.70*  5.98*   CA  7.4*   --   8.0*  7.6*   AGAP  7   --   8  10   BUCR  7*   --   6*  8*   AP   --   116   --    --     TP   --   6.3*   --    --    ALB   --   1.7*   --    --    GLOB   --   4.6*   --    --    AGRAT   --   0.4*   --    --  CBC w/Diff Recent Labs      10/30/15   0423  10/29/15   0250  10/28/15   0310   WBC  3.2*  2.2*  2.2*   RBC  2.56*  2.80*  3.10*   HGB  7.6*  8.3*  9.3*   HCT  24.1*  25.5*  27.4*   PLT  61*  73*  94*   GRANS  68  64  61   LYMPH  20*  23  26   EOS  Cardiac Enzymes Recent Labs      10/28/15   1030  10/28/15   0310   CPK  81  90   CKND1  1.0  1.7      Coagulation Recent Labs      10/30/15   0423  10/29/15   0250   PTP  19.0*  15.4*   INR  1.7*  1.3*       Lipid Panel Lab Results   Component Value Date/Time    CHOLESTEROL, TOTAL 85 11/29/2012 09:36 AM    HDL CHOLESTEROL 33 11/29/2012 09:36 AM    LDL, CALCULATED 41.2 11/29/2012 09:36 AM    VLDL, CALCULATED 10.8 11/29/2012 09:36 AM    TRIGLYCERIDE 54 11/29/2012 09:36 AM    CHOL/HDL RATIO 2.6 11/29/2012 09:36 AM      BNP No results for input(s): BNPP in the last 72 hours.   Liver Enzymes Recent Labs      10/30/15   0423   TP  6.3*   ALB  1.7*   AP  116   SGOT  19      Thyroid Studies Lab Results   Component Value Date/Time    TSH 3.63 10/28/2015 03:10 AM        Procedures/imaging: see electronic medical records for all procedures/Xrays and details which were not copied into this note but were reviewed prior to creation of Plan.Jonah Blue, MD

## 2015-10-30 NOTE — Progress Notes (Addendum)
Late entry, 10/29/2015: cm came to see the patient around 12:15 PM, he was out of unit for test/procedure. Will follow up with the patient today. O McFadden, rn, cm    10:45 Am, saw the patient in room, guard at bedside. Patient sleeping, easily awake to voice. Patient does not voice any questions or concerns in regards to his hospital stay nor to his discharge plan. DCP remains as established prior - [facility]   [facility] and was informed that last lab results will be needed (CD4 viral load count), or patient has to "walk in" and all needed labs [facility]. CD4 is not available.   [facility] that [facility] can arrange the patient with "walk [facility]" [facility] as long as discharging MD will document the need of it in dc summary. Robbie Lis McFadden, rn, cm

## 2015-10-30 NOTE — Progress Notes (Signed)
Progress Note    Tyler Ballard  34 y.o.      Admit Date: 10/16/2015  Active Problems:    Sickle cell anemia with pain (HCC) (07/05/2012) POA: Yes      ESRD (end stage renal disease) on dialysis (HCC) (07/06/2012) POA: Yes      HTN (hypertension) (07/06/2012) POA: Yes      Renal failure (10/16/2015) POA: Unknown      HIV (human immunodeficiency virus infection) (HCC) (10/17/2015) POA: Unknown      Secondary hyperparathyroidism of renal origin (HCC) (10/18/2015) POA: Yes            Subjective:     Patient says mild nausea, no SOB,tired.  Undergone Fistula gram yesterday ,angiplasited incluing stents, discussed with vascular surgery  & they have cleared to use this AVF & told the patient thatthis Fistula will be used today.      A comprehensive review of systems was negative except for that written in the History of Present Illness.    Objective:     Visit Vitals   ??? BP 96/65 (BP 1 Location: Right arm, BP Patient Position: At rest)   ??? Pulse 88   ??? Temp 97.7 ??F (36.5 ??C)   ??? Resp 19   ??? Ht 5\' 7"  (1.702 m)   ??? Wt 100.9 kg (222 lb 7.1 oz)   ??? SpO2 92%   ??? BMI 34.84 kg/m2         Intake/Output Summary (Last 24 hours) at 10/30/15 1240  Last data filed at 10/29/15 1828   Gross per 24 hour   Intake    240 ml   Output      0 ml   Net    240 ml       Current Facility-Administered Medications   Medication Dose Route Frequency Provider Last Rate Last Dose   ??? doxercalciferol (HECTOROL) 4 mcg/2 mL injection 1 mcg  1 mcg IntraVENous DIALYSIS MON, WED & FRI Loreli DollarMosta Guerino Caporale, MD       ??? sodium chloride 0.9 % in dextrose 10% 1,040 mL infusion   IntraVENous CONTINUOUS January-Jill Ogoy V, PA-C 50 mL/hr at 10/30/15 0551     ??? albuterol-ipratropium (DUO-NEB) 2.5 MG-0.5 MG/3 ML  3 mL Nebulization Q6H PRN Terrance MassAmit D Patel, MD       ??? nicotine (NICODERM CQ) 14 mg/24 hr patch 1 Patch  1 Patch TransDERmal DAILY Rollen SoxLynne M Maka, PA   1 Patch at 10/30/15 0846   ??? phenol throat spray (CHLORASEPTIC) 1 Spray  1 Spray Oral PRN Miranda Tamok, PA        ??? oxyCODONE-acetaminophen (PERCOCET 7.5) 7.5-325 mg per tablet 1 Tab  1 Tab Oral Q6H PRN Miranda Tamok, PA   1 Tab at 10/30/15 0845   ??? sodium chloride (NS) flush 5-10 mL  5-10 mL IntraVENous Q8H Miquel Dunnoderick L Myers, CRNA   10 mL at 10/30/15 0551   ??? sodium chloride (NS) flush 5-10 mL  5-10 mL IntraVENous PRN Miquel Dunnoderick L Myers, CRNA   10 mL at 10/28/15 1510   ??? insulin lispro (HUMALOG) injection   SubCUTAneous Q6H Miranda Tamok, PA   Stopped at 10/28/15 0000   ??? dextrose (D50W) injection syrg 12.5-25 g  25-50 mL IntraVENous PRN Miranda Tamok, PA   25 g at 10/28/15 0013   ??? epoetin alfa (EPOGEN;PROCRIT) injection 10,000 Units  10,000 Units IntraVENous Q MON, WED & FRI Loreli DollarMosta Veria Stradley, MD   Stopped at 10/28/15 1700   ???  famotidine (PF) (PEPCID) 20 mg in sodium chloride 0.9 % 10 mL injection  20 mg IntraVENous Q24H Miranda Tamok, PA   20 mg at 10/29/15 2044   ??? promethazine (PHENERGAN) suppository 25 mg  25 mg Rectal Q8H PRN Thomas P. Splan, MD       ??? heparin (porcine) 100 unit/mL injection 300 Units  300 Units InterCATHeter PRN Chanda Busing, MD       ??? 0.9% sodium chloride infusion 250 mL  250 mL IntraVENous PRN Loreli Dollar, MD       ??? promethazine (PHENERGAN) tablet 25 mg  25 mg Oral Q6H PRN Woodward Ku, MD   25 mg at 10/28/15 2016   ??? traZODone (DESYREL) tablet 50 mg  50 mg Oral QHS PRN Woodward Ku, MD   50 mg at 10/28/15 2017   ??? cloNIDine HCl (CATAPRES) tablet 0.1 mg  0.1 mg Oral Q2H PRN Woodward Ku, MD       ??? hydrALAZINE (APRESOLINE) 20 mg/mL injection 10 mg  10 mg IntraVENous Q6H PRN Woodward Ku, MD       ??? diphenhydrAMINE (BENADRYL) capsule 25 mg  25 mg Oral Q6H PRN Betsy Pries, MD   25 mg at 10/28/15 1653   ??? ammonium lactate (LAC-HYDRIN) 12 % lotion   Topical BID Betsy Pries, MD       ??? sodium chloride (NS) flush 5-10 mL  5-10 mL IntraVENous PRN Alben Deeds, CRNA       ??? naloxone (NARCAN) injection 0.1 mg  0.1 mg IntraVENous PRN Alben Deeds, CRNA       ??? glucose chewable tablet 16 g  4 Tab Oral PRN Alben Deeds, CRNA       ??? glucagon (GLUCAGEN) injection 1 mg  1 mg IntraMUSCular PRN Alben Deeds, CRNA       ??? sodium citrate 4 gram /100 mL (4 %) 0.08 g  2 mL Does Not Apply DIALYSIS PRN Loreli Dollar, MD       ??? calcium acetate (PHOSLO) capsule 667 mg  1 Cap Oral TID WITH MEALS Wanda Plump, MD   667 mg at 10/30/15 1220   ??? folic acid (FOLVITE) tablet 1 mg  1 mg Oral DAILY Wanda Plump, MD   Stopped at 10/29/15 0900   ??? hydroxyurea (HYDREA) chemo cap 500 mg  500 mg Oral DAILY Wanda Plump, MD   Stopped at 10/29/15 0900   ??? sodium citrate 4 gram /100 mL (4 %) 0.08 g  2 mL Hemodialysis DIALYSIS TUE, THU & SAT Loreli Dollar, MD   Stopped at 10/29/15 0900   ??? ondansetron (ZOFRAN) injection 4 mg  4 mg IntraVENous Q6H PRN Ouida Sills, MD   4 mg at 10/29/15 1326        Physical Exam:     Physical Exam:   General:  Alert, cooperative, no distress, appears stated age.   Neck: Supple, symmetrical, trachea midline, no adenopathy, thyroid: no enlargement/tenderness/nodules, no carotid bruit and no JVD.   Lungs:   Clear to auscultation bilaterally.   Heart:  Regular rate and rhythm, S1, S2 normal, no murmur, click, rub or gallop.   Abdomen:   Soft, non-tender. Bowel sounds normal. No masses,  No organomegaly.   Extremities: Extremities normal, atraumatic, no cyanosis or edema, AVF on right arm has good thrill & bruit.   Pulses: 2+ and symmetric all extremities.   Skin: Skin color, texture, turgor normal. No rashes  or lesions         Data Review:    CBC w/Diff    Recent Labs      10/30/15   0423  10/29/15   0250  10/28/15   0310   WBC  3.2*  2.2*  2.2*   RBC  2.56*  2.80*  3.10*   HGB  7.6*  8.3*  9.3*   HCT  24.1*  25.5*  27.4*   MCV  94.1  91.1  88.4   MCH  29.7  29.6  30.0   MCHC  31.5  32.5  33.9   RDW  19.1*  18.9*  18.2*    Recent Labs      10/30/15   0423  10/29/15   0250  10/28/15   0310   MONOS  11*  9  9   EOS  BASOS  0  1  1    RDW  19.1*  18.9*  18.2*        Comprehensive Metabolic Profile    Recent Labs      10/29/15   0250  10/28/15   0310   NA  138  134*   K  3.9  4.3   CL  101  101   CO2  29  23   BUN  28*  50*   CREA  4.70*  5.98*    Recent Labs      10/30/15   0423  10/29/15   0250  10/28/15   0310   CA   --   8.0*  7.6*   PHOS  3.1  3.7  3.5   ALB  1.7*   --    --    TP  6.3*   --    --    SGOT  19   --    --    TBILI  0.5   --    --                         Impression:       Active Hospital Problems    Diagnosis Date Noted   ??? Secondary hyperparathyroidism of renal origin (HCC) 10/18/2015   ??? HIV (human immunodeficiency virus infection) (HCC) 10/17/2015   ??? Renal failure 10/16/2015   ??? ESRD (end stage renal disease) on dialysis (HCC) 07/06/2012   ??? HTN (hypertension) 07/06/2012   ??? Sickle cell anemia with pain (HCC) 07/05/2012            Plan:     Will start using AVF today, no BMP done today, will draw stat BMP. Continue high dose of Epogen.      Loreli Dollar, MD

## 2015-10-30 NOTE — Progress Notes (Signed)
Palpable thrill right arm AVF.  Good to use.  Can remove catheters once using fistula without difficulty.  Please call with any further questions or concerns.

## 2015-10-30 NOTE — Other (Signed)
ACUTE HEMODIALYSIS FLOW SHEET    HEMODIALYSIS ORDERS: Physician: Dr. Jannette Fogo     Dialyzer: Revaclear     Duration: 3.5 hr  BFR: 350-400  DFR: 800   Dialysate:  Temp 36 - 37.0      K+   2.0    Ca+  3.0      Na 140     Bicarb 35   Weight: 100.5 kg    Bed Scale      Unable to Obtain       Dry weight/UF Goal: 2500 ml    Access  AVF, CVL      Needle Gauge: 16 - verbal order received           Heparin    Bolus     Units     Hourly       Units     None     Catheter locking solution : NA       Pre BP: 119/45  Pulse: 76  Temperature: 97.3  Respirations: 18  Tx: NS   ml/Bolus  Other      N/A   Labs: Pre        Post:         N/A   Additional Orders(medications, blood products, hypotension management):         N/A      Time Out/Safety Check   [facility] Consent Verified     CATHETER ACCESS: N/A   Right   Left   IJ     Fem    First use X-ray verified     Tunnel                 Non Tunneled   No S/S infection  Redness  Drainage Cultured Swelling Pain   Medical Aseptic Prep Utilized   Dressing Changed   Biopatch  Date:       Clotted   Patent   Flows: Good  Poor  Reversed   If access problem, Dr. notified: Yes    N/A  Date:        Not used     GRAFT/FISTULA ACCESS:  N/A     Right     Left     UE     LE   AVG   AVF        Buttonhole    Medical Aseptic Prep Utilized   No S/S infection  Redness  Drainage Cultured Swelling Pain    Bruit:    Strong     Weak       Thrill :    Strong     Weak       Needle Gauge: 16   Length: 1   If access problem, Dr. notified: Yes     N/A  Date:        Please describe access if present and not used:       GENERAL ASSESSMENT:    LUNGS:  Rate: Regualr SaO2% 98 %   N/A     Clear   Coarse   Crackles   Wheezing         Diminished     Location : RLL   LLL    RUL  LUL   Cough: Productive  Dry  N/A   Respirations:  Easy  Labored   Therapy:  RA  NC  l/min    Mask: NRB   Venti       O2%  Ventilator  Intubated   Trach   BiPaP   CARDIAC: Regular       Irregular    Pericardial Rub   JVD           Monitored   Bedside   Remotely monitored  N/A  Rhythm: SNR   EDEMA:  None  Generalized   Pitting  _0 Facial   Pedal    UE   LE   SKIN:    Warm   Hot      Cold    Dry      Pale    Diaphoretic                   Flushed   Jaundiced   Cyanotic   Rash   Weeping   LOC:     Alert      Oriented:     Person      Place  Time                Confused   Lethargic   Medicated   Non-responsive     GI / ABDOMEN:   Flat     Distended     Soft     Firm     Obese                              Diarrhea   Bowel Sounds   Nausea   Vomiting      GU / URINE ASSESSMENT:  Voiding    Oliguria   Anuria     Foley      Incontinent      Incontinent Brief        Bathroom Privileges      PAIN:  0 _1 Scale 0-10  Action/Follow Up:    MOBILITY:   Amb     Amb/Assist     Bed     Wheelchair   Stretcher      All Vitals and Treatment Details on Attached [facility]: [facility]          Room # 366      1st Time Acute   Stat   Routine   Urgent      Acute Room    Bedside   ICU/CCU   ER   Isolation Precautions:   Dialysis    Airborne    Contact     Reverse   Special Considerations:          Blood Consent Verified N/A     ALLERGIES:    NKA             Code Status:   Full Code   DNR   Other           HBsAg ONLY: Date Drawn 10/17/15        Negative Positive Unknown   HBsAb: Date 10/17/15     Susceptible    Immune10 Not Drawn   Drawn     Current Labs:  Date of Labs:          Today        Results for Tyler Ballard, Tyler Ballard (MRN [id number]) as of 10/30/2015 21:06   Ref. Range 10/30/2015 04:23   WBC Latest Ref Range: 4.6 - 13.2 K/uL 3.2 (L)   RBC Latest Ref Range: 4.70 - 5.50 M/uL 2.56 (L)   HGB Latest Ref Range: 13.0 - 16.0 g/dL 7.6 (L)   HCT Latest Ref Range: 36.0 - 48.0 % 24.1 (L)   MCV Latest Ref Range: 74.0 - 97.0 FL 94.1   MCH Latest Ref Range: 24.0 - 34.0 PG 29.7   MCHC Latest Ref Range: 31.0 - 37.0 g/dL 31.5   RDW Latest Ref Range: 11.6 - 14.5 % 19.1 (H)    PLATELET Latest Ref Range: 135 - 420 K/uL 61 (L)   MPV Latest Ref Range: 9.2 - 11.8 FL 10.3   NEUTROPHILS Latest Ref Range: 40 - 73 % 68   LYMPHOCYTES Latest Ref Range: 21 - 52 % 20 (L)   MONOCYTES Latest Ref Range: 3 - 10 % 11 (H)   EOSINOPHILS Latest Ref Range: 0 - 5 % 1   BASOPHILS Latest Ref Range: 0 - 2 % 0   DF Latest Units:   AUTOMATED   ABS. NEUTROPHILS Latest Ref Range: 1.8 - 8.0 K/UL 2.2   ABS. LYMPHOCYTES Latest Ref Range: 0.9 - 3.6 K/UL 0.6 (L)   ABS. MONOCYTES Latest Ref Range: 0.05 - 1.2 K/UL 0.4   ABS. EOSINOPHILS Latest Ref Range: 0.0 - 0.4 K/UL 0.0   ABS. BASOPHILS Latest Ref Range: 0.0 - 0.1 K/UL 0.0   INR Latest Ref Range: 0.8 - 1.2   1.7 (H)   Prothrombin time Latest Ref Range: 11.5 - 15.2 sec 19.0 (H)   Phosphorus Latest Ref Range: 2.5 - 4.9 MG/DL 3.1   Bilirubin, total Latest Ref Range: 0.2 - 1.0 MG/DL 0.5   Bilirubin, direct Latest Ref Range: 0.0 - 0.2 MG/DL 0.3 (H)   Protein, total Latest Ref Range: 6.4 - 8.2 g/dL 6.3 (L)   Albumin Latest Ref Range: 3.4 - 5.0 g/dL 1.7 (L)   Globulin Latest Ref Range: 2.0 - 4.0 g/dL 4.6 (H)   A-G Ratio Latest Ref Range: 0.8 - 1.7   0.4 (L)   ALT Latest Ref Range: 16 - 61 U/L <6 (L)   AST Latest Ref Range: 15 - 37 U/L 19   Alk. phosphatase Latest Ref Range: 45 - 117 U/L 116                                                                                                                                  DIET:   Renal     Other      NPO       Diabetic      PRIMARY NURSE REPORT: First initial/Last name/Title      Pre Dialysis: Quentin Cornwall RN  Time: 1300     EDUCATION:     Patient  Other         Knowledge Basis: None Minimal  Substantial   Barriers to learning  N/A    Access Care      S&S of infection      Fluid Management     K+     Procedural    Albumin      Medications      Tx Options      Transplant      Diet      Other   Teaching Tools:   Explain   Demo   Handouts  Video  Patient response:    Verbalized understanding   Teach back   Return  demonstration  Requires follow up   Inappropriate due to            [place] ??? Before each treatment:     Machine Number:                   [facility]                                   Unit Machine # 9 with centralized RO                                   Portable Machine #1/RO serial # [id number]                                   Portable Machine #2/RO serial # [id number]                                   Portable Machine #3/RO serial # [id number]                                                                                                       [facility]                                   Portable Machine #11/RO serial # [id number]                                    Portable Machine #12/RO serial # [id number]                                   Portable Machine #13/RO serial #  [id number]      Alarm Test:  Pass time 0945         Other:  RO/Machine Log Complete   Temp  36.8           Extracorporeal Circuit Tested for integrity   Dialysate: pH  7.4 Conductivity: Meter   14.0     HD Machine   14.3                  TCD: 14.0  Dialyzer Lot # [id number]            Blood Tubing Lot # [id number]         Saline Lot #  [id number]     CHLORINE TESTING-Before each treatment and every 4 hours    Total Chlorine:  less than 0.1 ppm  Time: 1400  4 Hr/2nd Check Time: 1700, 1900   (if greater than 0.1 ppm from Primary then every 30 minutes from Secondary)     TREATMENT INITIATION ??? with Dialysis Precautions:     All Connections Secured                  Saline Line Double Clamped    Venous Parameters Set                   Arterial Parameters Set     Prime Given  200 ml                        Air Foam Detector Engaged      Treatment Initiation Note: Pt arrived to dialysis room. Alert and oriented x 4. No acute distress noted prior to HD rx. Accessed via left AVF both lines flushed and aspirated well. Started HD tx using Revaclear dialyzer,  2K+ /3.0 Ca+ dialsylate bath. Dr Jannette Fogo at bedside. Verbal order received to use AVF, 16 G needle. Optimal BFR 350. MD Verified HD machine setting, and aware of changes.      Medication Dose Volume Route Initials Dialyzer Cleared:   Good  Fair   Poor    Blood processed: 65  L  UF Removed  2500 Ml    Post Wt: 95 kg  POst BP: 157/56 Pulse: 77 Respirations: 18 Temperature: 97.5                                   Post Tx Vascular Access: AVF/AVG: Bleeding stopped Art  79mn. Ven.  15Min   N/A                                   Catheter: Locking solution: Heparin 1:1000 Art.  ml  Ven.  ml  N/A                                 Post Assessment:                                    Skin:   Warm   Dry  Diaphoretic     Flushed   Pale  Cyanotic   [facility] Signatures Title Initials  Time Lungs:  Clear     Course   Crackles   Wheezing  Diminished   NViann FishRN NP 1700 Cardiac:  Regular    Irregular    Monitor   N/A  Rhythm:           Edema:   None     General      Facial    Pedal     UE     LE       Pain: 0  _0 Post Treatment Note: No acute distress noted post tx. Net fluid removed 2500 ml. Pt return to room in stable condition post HD tx.      POST TREATMENT PRIMARY NURSE HANDOFF REPORT:     First initial/Last name/Title         Post Dialysis: Quentin Cornwall, RN Time:  [phone]     Abbreviations: AVG-arterial venous graft, AVF-arterial venous fistula, IJ-Internal Jugular, Subcl-Subclavian, Fem-Femoral, Tx-treatment, AP/HR-apical heart rate, DFR-dialysate flow rate, BFR-blood flow rate, AP-arterial pressure, VP-venous pressure, UF-ultrafiltrate, TMP-transmembrane pressure, Ven-Venous, Art-Arterial, RO-Reverse Osmosis

## 2015-10-30 NOTE — Progress Notes (Signed)
Chaplain attended the interdisciplinary rounds for [name], who is a 33 y.o.,male. Patient???s Primary Language is: [place].   According to the patient???s EMR Religious Affiliation is: No religion.     The reason the Patient came to the hospital is:   Patient Active Problem List    Diagnosis Date Noted   ??? Secondary hyperparathyroidism of renal origin (HCC) 10/18/2015   ??? HIV (human immunodeficiency virus infection) (HCC) 10/17/2015   ??? ESRD (end stage renal disease) (HCC) 10/17/2015   ??? Renal failure 10/16/2015   ??? ESRD on dialysis (HCC) 11/25/2013   ??? Clotted dialysis access (HCC) 11/03/2013   ??? Dialysis patient (HCC) 11/03/2013   ??? Secondary hyperparathyroidism (HCC) 11/03/2013   ??? Positive blood culture 11/02/2013   ??? Pulmonary edema 09/06/2013   ??? Drug-seeking behavior 09/06/2013   ??? Noncompliance of patient with renal dialysis (HCC) 09/06/2013   ??? Pancytopenia (HCC) 09/05/2013   ??? Nausea & vomiting 09/04/2013   ??? Malingering 08/06/2013   ??? Do not give narcotics 08/06/2013   ??? ARF (acute renal failure) (HCC) 08/02/2013   ??? Sickle cell crisis (HCC) 05/26/2013     Class: Acute   ??? Arthralgia 03/29/2013   ??? Persistent vomiting 03/29/2013   ??? ESRD (end stage renal disease) on dialysis (HCC) 07/06/2012   ??? HTN (hypertension) 07/06/2012   ??? Sickle cell anemia with pain (HCC) 07/05/2012      Plan:  Chaplains will continue to follow and will provide pastoral care on an as needed/requested basis.  Chaplain recommends bedside caregivers page chaplain on duty if patient shows signs of acute spiritual or emotional distress.    Chaplain Eilene GhaziKerry Pokines   Board Certified Chaplain   Spiritual Care   [phone]

## 2015-10-30 NOTE — Other (Signed)
Bedside shift change report given to Eumeka, RN (oncoming nurse) by Anikin Prosser, RN (offgoing nurse). Report included the following information SBAR, Kardex and MAR.

## 2015-10-30 NOTE — Progress Notes (Signed)
NUTRITION    BPA/MST Referral      RECOMMENDATIONS / PLAN:     - Add supplements: Nepro TID   - Change to appropriate calorie diet     NUTRITION INTERVENTIONS:      Meals/Snacks: Modified diet, monitor PO intake   Medical food supplementation    Vitamin/mineral supplementation: folic acid    Nutrition-related medication management   Coordination of nutrition care: interdisciplinary rounds   Continue inpatient monitoring and intervention:     ASSESSMENT:     Subjective/Objective:  ESRD on hemodialysis. Good appetite and po intake, consuming 100% of meals.      Diet: DIET RENAL WITH OPTIONS 70-70-70 (House); Regular; Consistent Carb 1800kcal  Food Allergies: NKFA  Chewing/Swallowing Difficulty:   None known     Yes   Meal Intake:  Patient Vitals for the past 100 hrs:   % Diet Eaten   10/29/15 1828 100 %   10/26/15 1812 100 %   10/26/15 1523 100 %       BM: 11/8   Skin Integrity: leg surgical incision, catheter site   Edema: none     Pertinent Medications: Reviewed; phoslo, SSI, phenergan, zofran      Labs:  Recent Labs      10/30/15   0423  10/29/15   0250  10/28/15   0310   NA   --   138  134*   K   --   3.9  4.3   CL   --   101  101   CO2   --   29  23   GLU   --   89  98   BUN   --   28*  50*   CREA   --   4.70*  5.98*   CA   --   8.0*  7.6*   MG   --    --   2.1   PHOS  3.1  3.7  3.5   ALB  1.7*   --    --    SGOT  19   --    --    ALT  <6*   --    --       Lab Results   Component Value Date/Time    GLUCOSE 89 10/29/2015 02:50 AM    GLUCOSE (POC) 113 10/30/2015 08:05 AM    GLUCOSE (POC) 84 09/04/2013 07:35 PM    GLUCOSE (POC) 73 08/06/2013 01:16 PM         Intake/Output Summary (Last 24 hours) at 10/30/15 1139  Last data filed at 10/29/15 1828   Gross per 24 hour   Intake    240 ml   Output      0 ml   Net    240 ml       Anthropometrics:  Ht Readings from Last 1 Encounters:   10/17/15 $RemoveB'5\' 7"'MdoAScHc$  (1.702 m)       Last 3 Recorded Weights in this Encounter    10/27/15 0507 10/28/15 0400 10/30/15 0448    Weight: 105.9 kg (233 lb 7.5 oz) 101.6 kg (223 lb 15.8 oz) 100.9 kg (222 lb 7.1 oz)       Body mass index is 34.84 kg/(m^2).  Obese, Class I    Weight History: 23 lb, 9% weight loss x month per chart ??    Weight Metrics 10/30/2015 03/28/2014 11/01/2013 09/29/2013 09/06/2013 09/05/2013 08/06/2013   Weight 222 lb 7.1 oz 170 lb 180  lb 180 lb 180 lb 243 lb 222 lb   BMI 34.84 kg/m2 28.29 kg/m2 29.95 kg/m2 29.95 kg/m2 29.95 kg/m2 40.44 kg/m2 -          Admitting Diagnosis: ESRD (end stage renal disease) (HCC) [N18.6]  Past Medical History   Diagnosis Date   ??? AVN (avascular necrosis of bone) (HCC)      right hip   ??? Chronic kidney disease    ??? Chronic kidney disease (CKD), stage V (HCC)    ??? Dialysis patient [facility])    ??? ESRD on hemodialysis (HCC)    ??? HIV (human immunodeficiency virus infection) (HCC)    ??? HTN (hypertension) 07/06/2012   ??? HTN (hypertension)    ??? Hypertension    ??? Malingering      Sickle Cell testing negative   ??? Malingering      Patient states has had sickle cell disease since age of 69, hemoglobin electrophoresis at [facility] hospital and [facility] both negative for for sickle cell disease   ??? Noncompliance of patient with renal dialysis (HCC) 09/06/2013   ??? Sickle cell anemia (HCC)      NEGATIVE test. Likely patient states this Dx for secondary gain   ??? Sickle cell disease (HCC)      TOLD BY PATIENT AND NOT TRUE   ??? Stroke (HCC)      2014 pts states he has weakness to right side upper and lower extremities        Education Needs:         None identified  Identified - Not appropriate at this time    Identified and addressed - refer to education log  Learning Limitations:    None identified   Identified:   Cultural, religious & ethnic food preferences:   None identified     Identified and addressed     NUTRITION NEEDS & DIAGNOSIS:     Estimated Nutrition Needs:   Calories: 2310-2695 kcal (30-35 kcal/kg) based on    Actual BW:       SBW: 77 kg   Protein: 92-116 gm (1.2-1.5 gm/kg) based on    Actual BW:       SBW: 7 kg   Fluid: 1 mL/kcal    Patient expected to meet estimated nutrient needs:    Yes     No     Nutrition Diagnosis: Increased energy needs related to increased energy expenditure as evidenced by ESRD on HD.     MONITORING & EVALUATION:     Nutrition Goals:  1. Po intake of meals will meet >75% of patient estimated nutritional needs within the next 5-7 days.    Previous Goal(s) Outcome (for follow-up assessments only):  Goal 1:     Met      Not Met        Previous Recommendations (for follow-up assessments only):        Implemented          Not Implemented (RD to address)       Discharge Planning: renal diet      Participated in care planning, discharge planning, & interdisciplinary rounds as appropriate      Exie Parody, RD, CNSC   Pager: [phone]

## 2015-10-30 NOTE — Progress Notes (Signed)
RENAL PROGRESS NOTE: Pt seen on dialysis.  Follow up of ESRD     Present on Admission:  ??? ESRD (end stage renal disease) on dialysis [facility](HCC)  ??? HTN (hypertension)  ??? Sickle cell anemia with pain (HCC)  ??? Secondary hyperparathyroidism of renal origin [facility](HCC)         Subjective: Sleeing    Patient is on Dialysis.     Objective:Right arm AVF is working perfectly right with qb 400 & appropriate venous &atral pressure, no difficulty in cannulation with 16 gauge needle.    Patient Vitals for the past 12 hrs:   Temp Pulse Resp BP SpO2   10/30/15 1147 97.7 ??F (36.5 ??C) 88 19 96/65 92 %   10/30/15 0805 99.1 ??F (37.3 ??C) 95 20 127/57 90 %        Intake/Output Summary (Last 24 hours) at 10/30/15 1727  Last data filed at 10/29/15 1828   Gross per 24 hour   Intake    240 ml   Output      0 ml   Net    240 ml       Physical Assessment:     General Appearance: NAD  Lung: clear to auscultation  Heart: regular rate and rhythm and no murmurs, clicks or gallops  Lower Extremities: trace edema   Access: (R) arm AVF, qb 400.    Labs    CBC w/Diff    Recent Labs      10/30/15   0423  10/29/15   0250  10/28/15   0310   WBC  3.2*  2.2*  2.2*   RBC  2.56*  2.80*  3.10*   HGB  7.6*  8.3*  9.3*   HCT  24.1*  25.5*  27.4*   MCV  94.1  91.1  88.4   MCH  29.7  29.6  30.0   MCHC  31.5  32.5  33.9   RDW  19.1*  18.9*  18.2*    Recent Labs      10/30/15   0423  10/29/15   0250  10/28/15   0310   MONOS  11*  9  9   EOS  1  3  3    BASOS  0  1  1   RDW  19.1*  18.9*  18.2*        Comprehensive Metabolic Profile    Recent Labs      10/30/15   1345  10/29/15   0250  10/28/15   0310   NA  138  138  134*   K  4.0  3.9  4.3   CL  104  101  101   CO2  27  29  23    BUN  43*  28*  50*   CREA  6.28*  4.70*  5.98*    Recent Labs      10/30/15   1345  10/30/15   0423  10/29/15   0250  10/28/15   0310   CA  7.4*   --   8.0*  7.6*   PHOS   --   3.1  3.7  3.5   ALB   --   1.7*   --    --    TP   --   6.3*   --    --    SGOT   --   19   --    --     TBILI   --  0.5   --    --           Basic Metabolic Profile       results  reviwed.          MEDS:Reviwed.  Current Facility-Administered Medications   Medication Dose Route Frequency Provider Last Rate Last Dose   ??? doxercalciferol (HECTOROL) 4 mcg/2 mL injection 1 mcg  1 mcg IntraVENous DIALYSIS MON, WED & Serafina Royals, MD   1 mcg at 10/30/15 1715   ??? epoetin alfa (EPOGEN;PROCRIT) 15,000 Units  15,000 Units IntraVENous DIALYSIS MON, WED & Serafina Royals, MD   15,000 Units at 10/30/15 1700   ??? sodium chloride 0.9 % in dextrose 10% 1,040 mL infusion   IntraVENous CONTINUOUS January-Jill Ogoy V, PA-C 50 mL/hr at 10/30/15 0551     ??? albuterol-ipratropium (DUO-NEB) 2.5 MG-0.5 MG/3 ML  3 mL Nebulization Q6H PRN Terrance Mass, MD       ??? nicotine (NICODERM CQ) 14 mg/24 hr patch 1 Patch  1 Patch TransDERmal DAILY Rollen Sox, PA   1 Patch at 10/30/15 0846   ??? phenol throat spray (CHLORASEPTIC) 1 Spray  1 Spray Oral PRN Miranda Tamok, PA       ??? oxyCODONE-acetaminophen (PERCOCET 7.5) 7.5-325 mg per tablet 1 Tab  1 Tab Oral Q6H PRN Miranda Tamok, PA   1 Tab at 10/30/15 0845   ??? sodium chloride (NS) flush 5-10 mL  5-10 mL IntraVENous Q8H Miquel Dunn, CRNA   10 mL at 10/30/15 0551   ??? sodium chloride (NS) flush 5-10 mL  5-10 mL IntraVENous PRN Miquel Dunn, CRNA   10 mL at 10/28/15 1510   ??? insulin lispro (HUMALOG) injection   SubCUTAneous Q6H Miranda Tamok, PA   Stopped at 10/28/15 0000   ??? dextrose (D50W) injection syrg 12.5-25 g  25-50 mL IntraVENous PRN Miranda Tamok, PA   25 g at 10/28/15 0013   ??? famotidine (PF) (PEPCID) 20 mg in sodium chloride 0.9 % 10 mL injection  20 mg IntraVENous Q24H Miranda Tamok, PA   20 mg at 10/29/15 2044   ??? promethazine (PHENERGAN) suppository 25 mg  25 mg Rectal Q8H PRN Thomas P. Splan, MD       ??? heparin (porcine) 100 unit/mL injection 300 Units  300 Units InterCATHeter PRN Chanda Busing, MD       ??? 0.9% sodium chloride infusion 250 mL  250 mL IntraVENous PRN Loreli Dollar, MD       ??? promethazine (PHENERGAN) tablet 25 mg  25 mg Oral Q6H PRN Woodward Ku, MD   25 mg at 10/28/15 2016   ??? traZODone (DESYREL) tablet 50 mg  50 mg Oral QHS PRN Woodward Ku, MD   50 mg at 10/28/15 2017   ??? cloNIDine HCl (CATAPRES) tablet 0.1 mg  0.1 mg Oral Q2H PRN Woodward Ku, MD       ??? hydrALAZINE (APRESOLINE) 20 mg/mL injection 10 mg  10 mg IntraVENous Q6H PRN Woodward Ku, MD       ??? diphenhydrAMINE (BENADRYL) capsule 25 mg  25 mg Oral Q6H PRN Betsy Pries, MD   25 mg at 10/28/15 1653   ??? ammonium lactate (LAC-HYDRIN) 12 % lotion   Topical BID Betsy Pries, MD       ??? sodium chloride (NS) flush 5-10 mL  5-10 mL IntraVENous PRN Alben Deeds, CRNA       ??? naloxone (  NARCAN) injection 0.1 mg  0.1 mg IntraVENous PRN Alben Deeds, CRNA       ??? glucose chewable tablet 16 g  4 Tab Oral PRN Alben Deeds, CRNA       ??? glucagon (GLUCAGEN) injection 1 mg  1 mg IntraMUSCular PRN Alben Deeds, CRNA       ??? sodium citrate 4 gram /100 mL (4 %) 0.08 g  2 mL Does Not Apply DIALYSIS PRN Loreli Dollar, MD       ??? calcium acetate (PHOSLO) capsule 667 mg  1 Cap Oral TID WITH MEALS Wanda Plump, MD   667 mg at 10/30/15 1220   ??? folic acid (FOLVITE) tablet 1 mg  1 mg Oral DAILY Wanda Plump, MD   Stopped at 10/29/15 0900   ??? hydroxyurea (HYDREA) chemo cap 500 mg  500 mg Oral DAILY Wanda Plump, MD   Stopped at 10/29/15 0900   ??? sodium citrate 4 gram /100 mL (4 %) 0.08 g  2 mL Hemodialysis DIALYSIS TUE, THU & SAT Loreli Dollar, MD   Stopped at 10/29/15 0900   ??? ondansetron (ZOFRAN) injection 4 mg  4 mg IntraVENous Q6H PRN Ouida Sills, MD   4 mg at 10/29/15 1326       Impression:    ESRD: Tolerating HD well.  Anemia of CKD: on  Epogen.  Hyperparathyroidism Yes    Plan  Dialysis for volume and solute management  Epogen  1500 units.  Hectorol:1 microgram.  Will dialyze him again on 11/02/15 & then possibly Transfer back to [facility].         Loreli Dollar, MD

## 2015-10-30 NOTE — Other (Signed)
Bedside shift change report given to Craig Ionescu R Joshual Terrio, RN (oncoming nurse) by Carla, RN (offgoing nurse). Report included the following information SBAR, Kardex and MAR.

## 2015-10-31 LAB — GLUCOSE, POC
Glucose (POC): 109 mg/dL (ref 70–110)
Glucose (POC): 116 mg/dL — ABNORMAL HIGH (ref 70–110)

## 2015-10-31 LAB — PROTHROMBIN TIME + INR
INR: 1.3 — ABNORMAL HIGH (ref 0.8–1.2)
Prothrombin time: 15.5 s — ABNORMAL HIGH (ref 11.5–15.2)

## 2015-10-31 MED ORDER — INSULIN LISPRO 100 UNIT/ML INJECTION
100 unit/mL | Freq: Four times a day (QID) | SUBCUTANEOUS | Status: DC
Start: 2015-10-31 — End: 2015-11-03

## 2015-10-31 MED ORDER — LORAZEPAM 1 MG TAB
1 mg | Freq: Once | ORAL | Status: AC
Start: 2015-10-31 — End: 2015-10-31
  Administered 2015-10-31: 21:00:00 via ORAL

## 2015-10-31 MED ORDER — FAMOTIDINE 20 MG TAB
20 mg | Freq: Every evening | ORAL | Status: DC
Start: 2015-10-31 — End: 2015-10-31

## 2015-10-31 MED FILL — DIPHENHYDRAMINE 25 MG CAP: 25 mg | ORAL | Qty: 1

## 2015-10-31 MED FILL — DEXTROSE 10% IN WATER (D10W) IV: 10 % | INTRAVENOUS | Qty: 1000

## 2015-10-31 MED FILL — CALCIUM ACETATE 667 MG CAP: 667 mg | ORAL | Qty: 1

## 2015-10-31 MED FILL — NICOTINE 14 MG/24 HR DAILY PATCH: 14 mg/24 hr | TRANSDERMAL | Qty: 1

## 2015-10-31 MED FILL — FOLIC ACID 1 MG TAB: 1 mg | ORAL | Qty: 1

## 2015-10-31 MED FILL — FAMOTIDINE (PF) 20 MG/2 ML IV: 20 mg/2 mL | INTRAVENOUS | Qty: 2

## 2015-10-31 MED FILL — HYDROXYUREA 500 MG CAPSULE: 500 mg | ORAL | Qty: 1

## 2015-10-31 MED FILL — TRAZODONE 50 MG TAB: 50 mg | ORAL | Qty: 1

## 2015-10-31 MED FILL — PROMETHAZINE 25 MG TAB: 25 mg | ORAL | Qty: 1

## 2015-10-31 MED FILL — ONDANSETRON (PF) 4 MG/2 ML INJECTION: 4 mg/2 mL | INTRAMUSCULAR | Qty: 2

## 2015-10-31 MED FILL — OXYCODONE-ACETAMINOPHEN 7.5 MG-325 MG TAB: ORAL | Qty: 1

## 2015-10-31 MED FILL — LORAZEPAM 1 MG TAB: 1 mg | ORAL | Qty: 1

## 2015-10-31 MED FILL — INSULIN LISPRO 100 UNIT/ML INJECTION: 100 unit/mL | SUBCUTANEOUS | Qty: 1

## 2015-10-31 NOTE — Other (Signed)
Bedside and Verbal shift change report given to Emelia Loron RN (oncoming nurse) by Stephan Minister, RN (offgoing nurse). Report included the following information SBAR, Kardex, MAR and Recent Results.    SITUATION:   ? Code Status: Full Code  ? Reason for Admission: ESRD (end stage renal disease) (HCC) [N18.6]    ? Hospital day: 15  ? Problem List:       Hospital Problems  Date Reviewed: 11/06/2015          Codes Class Noted POA    Secondary hyperparathyroidism of renal origin [facility]) ICD-10-CM: N25.81  ICD-9-CM: 588.81  10/18/2015 Yes        HIV (human immunodeficiency virus infection) (HCC) ICD-10-CM: Z21  ICD-9-CM: V08  10/17/2015 Unknown        Renal failure ICD-10-CM: N19  ICD-9-CM: 586  10/16/2015 Unknown        ESRD (end stage renal disease) on dialysis [facility]) (Chronic) ICD-10-CM: N18.6, Z99.2  ICD-9-CM: 585.6, V45.11  07/06/2012 Yes        HTN (hypertension) (Chronic) ICD-10-CM: I10  ICD-9-CM: 401.9  07/06/2012 Yes        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 AVN (avascular necrosis of bone) (HCC)      right hip   ??? Chronic kidney disease    ??? Chronic kidney disease (CKD), stage V (HCC)    ??? Dialysis patient [facility])    ??? ESRD on hemodialysis (HCC)    ??? HIV (human immunodeficiency virus infection) (HCC)    ??? HTN (hypertension) 07/06/2012   ??? HTN (hypertension)    ??? Hypertension    ??? Malingering      Sickle Cell testing negative   ??? Malingering      Patient states has had sickle cell disease since age of 50, hemoglobin electrophoresis at [facility] hospital and [facility] both negative for for sickle cell disease   ??? Noncompliance of patient with renal dialysis (HCC) 09/06/2013   ??? Sickle cell anemia (HCC)      NEGATIVE test. Likely patient states this Dx for secondary gain   ??? Sickle cell disease (HCC)      TOLD BY PATIENT AND NOT TRUE   ??? Stroke (HCC)       2014 pts states he has weakness to right side upper and lower extremities          Patient taking anticoagulants no     ASSESSMENT:   ? Changes in Assessment Throughout Shift: Medicated for agitation nausea & pain    ? Patient has Central Line: no Reasons if yes:   ? Patient has Foley Cath: no Reasons if yes:      ? Last Vitals:     Vitals:    10/31/15 1547   BP: 159/79   Pulse: 84   Resp: 18   Temp:    TempSrc:    SpO2: 100%   Weight:    Height:        ? IV and DRAINS (will only show if present)   Peripheral IV 10/27/15 Right Other(comment)-Site Assessment: Clean, dry, & intact  [REMOVED] Airway - Endotracheal Tube 10/27/15 Oral-Site Assessment: Clean, dry, & intact  [REMOVED] Airway - Endotracheal Tube 10/27/15-Site Assessment: Clean, dry, & intact  [REMOVED] Orogastric Tube 10/27/15-Site  Assessment: Clean, dry, & intact  Double Lumen Hickman 09/04/13 Left Other(comment)-Site Assessment: Clean, dry, & intact  [REMOVED] Peripheral IV 10/17/15 Right Forearm-Site Assessment: Clean, dry, & intact  [REMOVED] Hemodialysis Catheter 10/18/15-Site Assessment: Clean, dry, & intact  [REMOVED] Hemodialysis Catheter 10/20/15-Site Assessment: Clean, dry, & intact  Hemodialysis Catheter 10/23/15-Site Assessment: Clean, dry, & intact  Hemodialysis Catheter 09/04/13-Site Assessment: Clean, dry, & intact    ? WOUND (if present)   Wound Type:  none   Dressing present Dressing Present : No   Wound Concerns/Notes:  none    ? PAIN    Pain Assessment    Pain Intensity 1: 4 (10/31/15 1725)    Pain Location 1:  (joints)    Pain Intervention(s) 1: Medication (see MAR)    Patient Stated Pain Goal: 0  o Interventions for Pain:  percocet  o Intervention effective: yes  o Time of last intervention: 1625   o Reassessment Completed: yes     ? Last 3 Weights:  Last 3 Recorded Weights in this Encounter    10/27/15 0507 10/28/15 0400 10/30/15 0448   Weight: 105.9 kg (233 lb 7.5 oz) 101.6 kg (223 lb 15.8 oz) 100.9 kg (222 lb 7.1 oz)      Weight change:     ? INTAKE/OUPUT    Current Shift:      Last three shifts: 11/11 0701 - 11/12 1900  In: 930 [P.O.:330; I.V.:600]  Out: 2500     ? LAB RESULTS     Recent Labs      10/30/15   0423  10/29/15   0250   WBC  3.2*  2.2*   HGB  7.6*  8.3*   HCT  24.1*  25.5*   PLT  61*  73*        Recent Labs      10/31/15   1230  10/30/15   1345  10/30/15   0423  10/29/15   0250   NA   --   138   --   138   K   --   4.0   --   3.9   GLU   --   112*   --   89   BUN   --   43*   --   28*   CREA   --   6.28*   --   4.70*   CA   --   7.4*   --   8.0*   INR  1.3*   --   1.7*  1.3*       RECOMMENDATIONS AND DISCHARGE PLANNING     1. Pending tests/procedures/ Plan of Care or Other Needs: Dialysis     2. Discharge plan for patient and Needs/Barriers: patient Incarcerated    3. Estimated Discharge Date: 11/15 Posted on Whiteboard in Patient???s Room: yes      4. The patient's care plan was reviewed with the oncoming nurse.       "HEALS" SAFETY CHECK      Fall Risk    Total Score: 2    Safety Measures: Safety Measures: Bed/Chair-Wheels locked, Bed in low position, Call light within reach, Fall prevention (comment), Side rails X 3, Other (comment) (guard at bedside)    A safety check occurred in the patient's room between off going nurse and oncoming nurse listed above.    The safety check included the below items  Area Items   H  High Alert Medications ? Verify  all high alert medication drips (heparin, PCA, etc.)   E  Equipment ? Suction is set up for ALL patients (with yanker)  ? Red plugs utilized for all equipment (IV pumps, etc.)  ? WOW???s wiped down at end of shift.  ? Room stocked with oxygen, suction, and other unit-specific supplies   A  Alarms ? Bed alarm is set for fall risk patients  ? Ensure chair alarm is in place and activated if patient is up in a chair   L  Lines ? Check IV for any infiltration  ? Foley bag is empty if patient has a Foley   ? Tubing and IV bags are labeled   S  Safety    ? Room is clean, patient is clean, and equipment is clean.  ? Hallways are clear from equipment besides carts.   ? Fall bracelet on for fall risk patients  ? Ensure room is clear and free of clutter  ? Suction is set up for ALL patients (with yanker)  ? Hallways are clear from equipment besides carts.   ? Isolation precautions followed, supplies available outside room, sign posted     Melody Neita Goodnight, RN

## 2015-10-31 NOTE — Other (Addendum)
Bedside and Verbal shift change report given to Melody, RN (Cabin crewoncoming nurse) by Laurell RoofE. Clark, RN (offgoing nurse). Report included the following information SBAR, Kardex and Cardiac Rhythm Sinus Rhythm. Patient had no acute events overnight. Currently resting in NAD with guard at bedside. No express needs reported at this time.

## 2015-10-31 NOTE — Progress Notes (Signed)
Progress Note    Tyler Ballard  34 y.o.      Admit Date: 10/16/2015  Active Problems:    Sickle cell anemia with pain (HCC) (07/05/2012) POA: Yes      ESRD (end stage renal disease) on dialysis (HCC) (07/06/2012) POA: Yes      HTN (hypertension) (07/06/2012) POA: Yes      Renal failure (10/16/2015) POA: Unknown      HIV (human immunodeficiency virus infection) (HCC) (10/17/2015) POA: Unknown      Secondary hyperparathyroidism of renal origin (HCC) (10/18/2015) POA: Yes            Subjective:     Patient feels tired, spiked temperature  Yesterday, no blood culture done, now no fever. Dialyzed yesterday,used his Right arm AVF without any problem, still has Right Groin catheter, (L) IJ cathteter.      A comprehensive review of systems was negative except for that written in the History of Present Illness.    Objective:     Visit Vitals   ??? BP 135/56 (BP 1 Location: Right arm, BP Patient Position: At rest)   ??? Pulse 77   ??? Temp 97.6 ??F (36.4 ??C)   ??? Resp 18   ??? Ht  (1.702 m)   ??? Wt 100.9 kg (222 lb 7.1 oz)   ??? SpO2 100%   ??? BMI 34.84 kg/m2         Intake/Output Summary (Last 24 hours) at 10/31/15 1147  Last data filed at 10/31/15 0916   Gross per 24 hour   Intake    330 ml   Output   2500 ml   Net  -2170 ml       Current Facility-Administered Medications   Medication Dose Route Frequency Provider Last Rate Last Dose   ??? doxercalciferol (HECTOROL) 4 mcg/2 mL injection 1 mcg  1 mcg IntraVENous DIALYSIS MON, WED & Serafina Royals, MD   1 mcg at 10/30/15 1715   ??? epoetin alfa (EPOGEN;PROCRIT) 15,000 Units  15,000 Units IntraVENous DIALYSIS MON, WED & Serafina Royals, MD   15,000 Units at 10/30/15 1700   ??? insulin lispro (HUMALOG) injection   SubCUTAneous AC&HS Wanda Plump, MD   Stopped at 10/30/15 2200   ??? sodium chloride 0.9 % in dextrose 10% 1,040 mL infusion   IntraVENous CONTINUOUS January-Jill Ogoy V, PA-C 50 mL/hr at 10/31/15 0600     ??? albuterol-ipratropium (DUO-NEB) 2.5 MG-0.5 MG/3 ML  3 mL Nebulization  Q6H PRN Terrance Mass, MD       ??? nicotine (NICODERM CQ) 14 mg/24 hr patch 1 Patch  1 Patch TransDERmal DAILY Rollen Sox, PA   1 Patch at 10/31/15 [phone]   ??? phenol throat spray (CHLORASEPTIC) 1 Spray  1 Spray Oral PRN Miranda Tamok, PA       ??? oxyCODONE-acetaminophen (PERCOCET 7.5) 7.5-325 mg per tablet 1 Tab  1 Tab Oral Q6H PRN Willaim Bane, PA   1 Tab at 10/31/15 1000   ??? sodium chloride (NS) flush 5-10 mL  5-10 mL IntraVENous Q8H Miquel Dunn, CRNA   10 mL at 10/31/15 0600   ??? sodium chloride (NS) flush 5-10 mL  5-10 mL IntraVENous PRN Miquel Dunn, CRNA   10 mL at 10/28/15 1510   ??? dextrose (D50W) injection syrg 12.5-25 g  25-50 mL IntraVENous PRN Miranda Tamok, PA   25 g at 10/28/15 0013   ??? famotidine (PF) (PEPCID) 20 mg in sodium chloride  0.9 % 10 mL injection  20 mg IntraVENous Q24H Miranda Tamok, PA   20 mg at 10/30/15 2228   ??? promethazine (PHENERGAN) suppository 25 mg  25 mg Rectal Q8H PRN Thomas P. Splan, MD       ??? heparin (porcine) 100 unit/mL injection 300 Units  300 Units InterCATHeter PRN Chanda Busing, MD       ??? 0.9% sodium chloride infusion 250 mL  250 mL IntraVENous PRN Loreli Dollar, MD       ??? promethazine (PHENERGAN) tablet 25 mg  25 mg Oral Q6H PRN Woodward Ku, MD   25 mg at 10/28/15 2016   ??? traZODone (DESYREL) tablet 50 mg  50 mg Oral QHS PRN Woodward Ku, MD   50 mg at 10/30/15 2228   ??? cloNIDine HCl (CATAPRES) tablet 0.1 mg  0.1 mg Oral Q2H PRN Woodward Ku, MD       ??? hydrALAZINE (APRESOLINE) 20 mg/mL injection 10 mg  10 mg IntraVENous Q6H PRN Woodward Ku, MD       ??? diphenhydrAMINE (BENADRYL) capsule 25 mg  25 mg Oral Q6H PRN Betsy Pries, MD   25 mg at 10/31/15 0959   ??? ammonium lactate (LAC-HYDRIN) 12 % lotion   Topical BID Betsy Pries, MD       ??? sodium chloride (NS) flush 5-10 mL  5-10 mL IntraVENous PRN Alben Deeds, CRNA       ??? naloxone (NARCAN) injection 0.1 mg  0.1 mg IntraVENous PRN Alben Deeds, CRNA       ??? glucose chewable tablet 16 g  4 Tab Oral PRN Alben Deeds, CRNA       ??? glucagon (GLUCAGEN) injection 1 mg  1 mg IntraMUSCular PRN Alben Deeds, CRNA       ??? sodium citrate 4 gram /100 mL (4 %) 0.08 g  2 mL Does Not Apply DIALYSIS PRN Loreli Dollar, MD       ??? calcium acetate (PHOSLO) capsule 667 mg  1 Cap Oral TID WITH MEALS Wanda Plump, MD   667 mg at 10/31/15 1610   ??? folic acid (FOLVITE) tablet 1 mg  1 mg Oral DAILY Wanda Plump, MD   1 mg at 10/31/15 9604   ??? hydroxyurea (HYDREA) chemo cap 500 mg  500 mg Oral DAILY Wanda Plump, MD   500 mg at 10/31/15 0953   ??? sodium citrate 4 gram /100 mL (4 %) 0.08 g  2 mL Hemodialysis DIALYSIS TUE, THU & SAT Loreli Dollar, MD   Stopped at 10/29/15 0900   ??? ondansetron (ZOFRAN) injection 4 mg  4 mg IntraVENous Q6H PRN Ouida Sills, MD   4 mg at 10/29/15 1326        Physical Exam:     Physical Exam:   General:  Alert, cooperative, no distress, appears stated age.   Neck: Supple, symmetrical, trachea midline, no adenopathy, thyroid: no enlargement/tenderness/nodules, no carotid bruit and no JVD.   Lungs:   Clear to auscultation bilaterally.   Heart:  Regular rate and rhythm, S1, S2 normal, no murmur, click, rub or gallop.   Abdomen:   Soft, non-tender. Bowel sounds normal. No masses,  No organomegaly.   Extremities: Extremities normal, atraumatic, no cyanosis or edema, AVF on right arhas good thrill& bruit.   Skin: Skin color, texture, turgor normal. No rashes or lesions         Data Review:    CBC  w/Diff    Recent Labs      10/30/15   0423  10/29/15   0250   WBC  3.2*  2.2*   RBC  2.56*  2.80*   HGB  7.6*  8.3*   HCT  24.1*  25.5*   MCV  94.1  91.1   MCH  29.7  29.6   MCHC  31.5  32.5   RDW  19.1*  18.9*    Recent Labs      10/30/15   0423  10/29/15   0250   MONOS  11*  9   EOS  1  3   BASOS  0  1   RDW  19.1*  18.9*        Comprehensive Metabolic Profile    Recent Labs      10/30/15   1345  10/29/15   0250   NA  138  138   K  4.0  3.9    CL  104  101   CO2  27  29   BUN  43*  28*   CREA  6.28*  4.70*    Recent Labs      10/30/15   1345  10/30/15   0423  10/29/15   0250   CA  7.4*   --   8.0*   PHOS   --   3.1  3.7   ALB   --   1.7*   --    TP   --   6.3*   --    SGOT   --   19   --    TBILI   --   0.5   --                         Impression:       Active Hospital Problems    Diagnosis Date Noted   ??? Secondary hyperparathyroidism of renal origin (HCC) 10/18/2015   ??? HIV (human immunodeficiency virus infection) (HCC) 10/17/2015   ??? Renal failure 10/16/2015   ??? ESRD (end stage renal disease) on dialysis (HCC) 07/06/2012   ??? HTN (hypertension) 07/06/2012   ??? Sickle cell anemia with pain (HCC) 07/05/2012      Fever in a HD patient with  2 HD catheter, AIDS.      Plan:     If re-spike needs to do Blood culture & start on antibiotics, will dialyze him on 11/02/15. ,arrange catheter removal.      Loreli DollarMosta Alycia Cooperwood, MD

## 2015-10-31 NOTE — Other (Signed)
[facility]. Lying in bed.Note guard at bedside. Left leg shakled to bed. Denies pain or shortness of breath at this time. Note Multiple dialysis access sites. See assessment. Call bell in reach.

## 2015-10-31 NOTE — Progress Notes (Signed)
Hospitalist Progress Note    Patient: Tyler Ballard MRN: [id number]  CSN: [id number]    Date of Birth: 11-02-1981  Age: 34 y.o.  Sex: male    DOA: 10/16/2015 LOS:  LOS: 15 days          Patient sitting in bed, c/o anxiety    Assessment/Plan     1. Brief cardiac arrest possible due to Hyperkalemia: s/p resuscitation.    2. Hyperkalemia: improved  3. ESRD / HD per nephrology.  HD as per Nephro  4. Acute resp failure: s/p intubation in OR, s/p extubation   5. HIV/AIDS: asymptomatic, no infections:resume bactrim at discharge if K remains stable   6. Non-functioning HD access - s/p HD catheter X 3 placement by vascular and all had break and cant use them, he had 3 bleeding episodes and RRT's.  Pt totally had 4 cath leak and bleeding this hospitalization.   7. Pancytopenia in the setting of HIV.   8. Acute blood loss Anemia due to bleeding from broken  HD cath: s/p 5 units transfused      9. HTN: monitor BP  Dispo- home when OK with Nephro, Dialysis catheters will need to be removed on Monday.  D/w RN      Case discussed with:  Patient  Family  Nursing  Case Management  DVT Prophylaxis:  Lovenox  Hep SQ  SCDs  Coumadin   On Heparin gtt    Vital signs/Intake and Output:  Visit Vitals   ??? BP 135/56 (BP 1 Location: Right arm, BP Patient Position: At rest)   ??? Pulse 77   ??? Temp 97.6 ??F (36.4 ??C)   ??? Resp 18   ??? Ht  (1.702 m)   ??? Wt 100.9 kg (222 lb 7.1 oz)   ??? SpO2 100%   ??? BMI 34.84 kg/m2       General:  Awake, alert  Cardiovascular:  S1S2+, RRR  Pulmonary:  CTA b/l  GI:  Soft, BS+, NT, ND  Extremities:  No edema        Medications Reviewed      Labs: Results:       Chemistry Recent Labs      10/30/15   1345  10/30/15   0423  10/29/15   0250   GLU  112*   --   89   NA  138   --   138   K  4.0   --   3.9   CL  104   --   101   CO2  27   --   29   BUN  43*   --   28*   CREA  6.28*   --   4.70*   CA  7.4*   --   8.0*   AGAP  7   --   8   BUCR  7*   --   6*   AP   --   116   --    TP   --   6.3*   --     ALB   --   1.7*   --    GLOB   --   4.6*   --    AGRAT   --   0.4*   --       CBC w/Diff Recent Labs     KHALI ALBANESE3  10/29/15   0250   WBC  3.2*  2.2*  RBC  2.56*  2.80*   HGB  7.6*  8.3*   HCT  24.1*  25.5*   PLT  61*  73*   GRANS  68  64   LYMPH  20*  23   EOS  1  3      Cardiac Enzymes No results for input(s): CPK, CKND1, MYO in the last 72 hours.    No lab exists for component: CKRMB, TROIP   Coagulation Recent Labs      10/31/15   1230  10/30/15   0423   PTP  15.5*  19.0*   INR  1.3*  1.7*       Lipid Panel Lab Results   Component Value Date/Time    CHOLESTEROL, TOTAL 85 11/29/2012 09:36 AM    HDL CHOLESTEROL 33 11/29/2012 09:36 AM    LDL, CALCULATED 41.2 11/29/2012 09:36 AM    VLDL, CALCULATED 10.8 11/29/2012 09:36 AM    TRIGLYCERIDE 54 11/29/2012 09:36 AM    CHOL/HDL RATIO 2.6 11/29/2012 09:36 AM      BNP No results for input(s): BNPP in the last 72 hours.   Liver Enzymes Recent Labs      10/30/15   0423   TP  6.3*   ALB  1.7*   AP  116   SGOT  19      Thyroid Studies Lab Results   Component Value Date/Time    TSH 3.63 10/28/2015 03:10 AM        Procedures/imaging: see electronic medical records for all procedures/Xrays and details which were not copied into this note but were reviewed prior to creation of Plan.Jonah Blue-    Salman Wellen, MD

## 2015-10-31 NOTE — Other (Signed)
Patient refused to let me take his blood sugar.

## 2015-10-31 NOTE — Other (Signed)
1910 - Received shift report from Melody RN, patient was resting in bed, watching television, oriented pt to call button.    0710 - Gave shift repot to TappanRosie, using MAR, Kardex and SBAR.

## 2015-11-01 LAB — PROTHROMBIN TIME + INR
INR: 1.2 (ref 0.8–1.2)
Prothrombin time: 14.9 s (ref 11.5–15.2)

## 2015-11-01 LAB — GLUCOSE, POC
Glucose (POC): 90 mg/dL (ref 70–110)
Glucose (POC): 134 mg/dL — ABNORMAL HIGH (ref 70–110)
Glucose (POC): 109 mg/dL (ref 70–110)

## 2015-11-01 MED ORDER — HYDROMORPHONE (PF) 2 MG/ML IJ SOLN
2 mg/mL | Freq: Four times a day (QID) | INTRAMUSCULAR | Status: DC | PRN
Start: 2015-11-01 — End: 2015-11-03
  Administered 2015-11-02 – 2015-11-03 (×8): via INTRAVENOUS

## 2015-11-01 MED ORDER — SODIUM CHLORIDE 0.9 % INJECTION
202 mg/2 mL | Freq: Every evening | INTRAMUSCULAR | Status: DC
Start: 2015-11-01 — End: 2015-11-03
  Administered 2015-11-01 – 2015-11-03 (×3): via INTRAVENOUS

## 2015-11-01 MED ORDER — HYDROMORPHONE (PF) 1 MG/ML IJ SOLN
1 mg/mL | Freq: Once | INTRAMUSCULAR | Status: AC
Start: 2015-11-01 — End: 2015-11-01
  Administered 2015-11-01: 18:00:00 via INTRAVENOUS

## 2015-11-01 MED ORDER — AMLODIPINE 10 MG TAB
10 mg | Freq: Every day | ORAL | Status: DC
Start: 2015-11-01 — End: 2015-11-03
  Administered 2015-11-03: 15:00:00 via ORAL

## 2015-11-01 MED ORDER — FAMOTIDINE 20 MG IN 50 ML NS IVPB
20 mg/50 mL | Freq: Every evening | INTRAVENOUS | Status: DC
Start: 2015-11-01 — End: 2015-10-31

## 2015-11-01 MED ORDER — ENALAPRILAT 1.25 MG/ML IV INJ
1.25 mg/mL | Freq: Four times a day (QID) | INTRAVENOUS | Status: DC | PRN
Start: 2015-11-01 — End: 2015-11-03
  Administered 2015-11-01: 18:00:00 via INTRAVENOUS

## 2015-11-01 MED FILL — HYDROMORPHONE (PF) 1 MG/ML IJ SOLN: 1 mg/mL | INTRAMUSCULAR | Qty: 1

## 2015-11-01 MED FILL — FAMOTIDINE (PF) 20 MG/2 ML IV: 20 mg/2 mL | INTRAVENOUS | Qty: 2

## 2015-11-01 MED FILL — CLONIDINE 0.1 MG TAB: 0.1 mg | ORAL | Qty: 1

## 2015-11-01 MED FILL — NICOTINE 14 MG/24 HR DAILY PATCH: 14 mg/24 hr | TRANSDERMAL | Qty: 1

## 2015-11-01 MED FILL — OXYCODONE-ACETAMINOPHEN 7.5 MG-325 MG TAB: ORAL | Qty: 1

## 2015-11-01 MED FILL — ENALAPRILAT 1.25 MG/ML IV INJ: 1.25 mg/mL | INTRAVENOUS | Qty: 1

## 2015-11-01 MED FILL — DEXTROSE 10% IN WATER (D10W) IV: 10 % | INTRAVENOUS | Qty: 1000

## 2015-11-01 MED FILL — HYDRALAZINE 20 MG/ML IJ SOLN: 20 mg/mL | INTRAMUSCULAR | Qty: 1

## 2015-11-01 MED FILL — INSULIN LISPRO 100 UNIT/ML INJECTION: 100 unit/mL | SUBCUTANEOUS | Qty: 1

## 2015-11-01 MED FILL — HYDROXYUREA 500 MG CAPSULE: 500 mg | ORAL | Qty: 1

## 2015-11-01 MED FILL — CALCIUM ACETATE 667 MG CAP: 667 mg | ORAL | Qty: 1

## 2015-11-01 MED FILL — ONDANSETRON (PF) 4 MG/2 ML INJECTION: 4 mg/2 mL | INTRAMUSCULAR | Qty: 2

## 2015-11-01 MED FILL — DIPHENHYDRAMINE 25 MG CAP: 25 mg | ORAL | Qty: 1

## 2015-11-01 MED FILL — TRAZODONE 50 MG TAB: 50 mg | ORAL | Qty: 1

## 2015-11-01 MED FILL — FOLIC ACID 1 MG TAB: 1 mg | ORAL | Qty: 1

## 2015-11-01 MED FILL — DILAUDID (PF) 2 MG/ML INJECTION SOLUTION: 2 mg/mL | INTRAMUSCULAR | Qty: 1

## 2015-11-01 NOTE — Progress Notes (Addendum)
Received report on pt.from off going RN.  Resting quietly in bed on rounds. Denies c/o pain or SOB at this time. No acute distress noted. Will cont to monitor for any changes in status. Guard at bedside.    1013 medicated with po catapress for elevated BP. Pt refusing all other po meds. sts he wants his meds given iv. Will discuss with MD. zofran given for nausea.       1112 c/o pain in right groin HD cath site. Requesting iv dilaudid. Paged Dr Garret Reddishhakral and spoke with him and he said to give the pt hid percocet now even though it was early. Percocet given . Pt immediately vomited sm amt of clear emesis with percocet still intact in bag.  Pt still requesting iv dilaudid. Explained to pt that MD would be in to assess his pain med status.     1150 Dr Ardelle ParkHaque in and was notified of elevated BPs. New order received. Pt asking Dr Ardelle ParkHaque for IV meds only and also for IV dilaudid. Explained to pt That Dr Garret Reddishhakral would handle pain meds. Dr Garret Reddishhakral paged and notified that pt had vomited percocet back up.     1215 IV vasotec given for BP and ordered one time dose of IV dilaudid given for generalized and rt groin pain.     1400 guard at bedside. Resting more quietly but still asking when MD will be in to order him more pain meds.     1700 sitting up eating dinner. Pt has refused to be pulled up or turned all day. sts he hurts too bad. No further vomiting. C/o nausea but refuses iv zofran. Refuses phenergan suppository and tablet. sts he cant turn over for the suppository because it hurts and hes too nauseated for pills. Requesting iv phenergan and more iv dilaudid. Dr Garret Reddishhakral paged and will be in to assess pt .     1810 iv hydralazine given for BP.    1830 Dr Garret Reddishhakral in and talked with pt. Will put new orders in.     1900 dilaudid IV given for pain. Pt still requesting iv phenergan but no orders at this time.     1930 Dr Garret Reddishhakral paged in regards to IV Pherergan. Pt keeps turning on  light and asking for meds every few minutes. Guard at bedside.

## 2015-11-01 NOTE — Other (Signed)
1910 - [facility] shift [facility], pt was resting in bed, watching tv, oriented to call button. During shift report Minus LibertyRosie stated the patient refused to be moved all day.      [id number] - Gave shift report to Clovis FredricksonShana S. RN, using SBAR, MAR, and Kardex.

## 2015-11-01 NOTE — Progress Notes (Signed)
Progress Note    Tyler Ballard  34 y.o.      Admit Date: 10/16/2015  Active Problems:    Sickle cell anemia with pain (HCC) (07/05/2012) POA: Yes      ESRD (end stage renal disease) on dialysis (HCC) (07/06/2012) POA: Yes      HTN (hypertension) (07/06/2012) POA: Yes      Renal failure (10/16/2015) POA: Unknown      HIV (human immunodeficiency virus infection) (HCC) (10/17/2015) POA: Unknown      Secondary hyperparathyroidism of renal origin (HCC) (10/18/2015) POA: Yes            Subjective:     Patient is complaining of nausea, vomiting, can not keep BP medicine, having subjective complain of pain /pressure on Right Groin/ (L) IJ catheter insertion site. On IVF at 50 cc /hour.      A comprehensive review of systems was negative except for that written in the History of Present Illness.    Objective:     Visit Vitals   ??? BP (!) 180/95 (BP 1 Location: Left arm, BP Patient Position: At rest)   ??? Pulse 83   ??? Temp 98.1 ??F (36.7 ??C)   ??? Resp 17   ??? Ht  (1.702 m)   ??? Wt 100.9 kg (222 lb 7.1 oz)   ??? SpO2 100%   ??? BMI 34.84 kg/m2         Intake/Output Summary (Last 24 hours) at 11/01/15 1231  Last data filed at 11/01/15 0801   Gross per 24 hour   Intake   1320 ml   Output      0 ml   Net   1320 ml       Current Facility-Administered Medications   Medication Dose Route Frequency Provider Last Rate Last Dose   ??? HYDROmorphone (PF) (DILAUDID) injection 1 mg  1 mg IntraVENous ONCE Jonah Blue, MD       ??? enalaprilat (VASOTEC) injection 1.25 mg  1.25 mg IntraVENous Q6H PRN Loreli Dollar, MD       ??? famotidine (PF) (PEPCID) 20 mg in sodium chloride 0.9 % 10 mL injection  20 mg IntraVENous QHS Elayne Guerin III, MD   20 mg at 10/31/15 2242   ??? doxercalciferol (HECTOROL) 4 mcg/2 mL injection 1 mcg  1 mcg IntraVENous DIALYSIS MON, WED & Serafina Royals, MD   1 mcg at 10/30/15 1715   ??? epoetin alfa (EPOGEN;PROCRIT) 15,000 Units  15,000 Units IntraVENous  DIALYSIS MON, WED & Serafina Royals, MD   15,000 Units at 10/30/15 1700   ??? insulin lispro (HUMALOG) injection   SubCUTAneous AC&HS Wanda Plump, MD   Stopped at 10/30/15 2200   ??? albuterol-ipratropium (DUO-NEB) 2.5 MG-0.5 MG/3 ML  3 mL Nebulization Q6H PRN Terrance Mass, MD       ??? nicotine (NICODERM CQ) 14 mg/24 hr patch 1 Patch  1 Patch TransDERmal DAILY Rollen Sox, PA   1 Patch at 11/01/15 1017   ??? phenol throat spray (CHLORASEPTIC) 1 Spray  1 Spray Oral PRN Miranda Tamok, PA       ??? oxyCODONE-acetaminophen (PERCOCET 7.5) 7.5-325 mg per tablet 1 Tab  1 Tab Oral Q6H PRN Miranda Tamok, PA   1 Tab at 11/01/15 1119   ??? sodium chloride (NS) flush 5-10 mL  5-10 mL IntraVENous Q8H Miquel Dunn, CRNA   5 mL at 10/31/15 2246   ??? sodium chloride (NS) flush 5-10 mL  5-10 mL IntraVENous PRN Miquel Dunn, CRNA   10 mL at 10/28/15 1510   ??? dextrose (D50W) injection syrg 12.5-25 g  25-50 mL IntraVENous PRN Willaim Bane, PA   25 g at 10/28/15 0013   ??? promethazine (PHENERGAN) suppository 25 mg  25 mg Rectal Q8H PRN Thomas P. Splan, MD       ??? heparin (porcine) 100 unit/mL injection 300 Units  300 Units InterCATHeter PRN Chanda Busing, MD       ??? 0.9% sodium chloride infusion 250 mL  250 mL IntraVENous PRN Loreli Dollar, MD       ??? promethazine (PHENERGAN) tablet 25 mg  25 mg Oral Q6H PRN Woodward Ku, MD   25 mg at 10/31/15 1744   ??? traZODone (DESYREL) tablet 50 mg  50 mg Oral QHS PRN Woodward Ku, MD   50 mg at 10/31/15 2250   ??? cloNIDine HCl (CATAPRES) tablet 0.1 mg  0.1 mg Oral Q2H PRN Woodward Ku, MD   0.1 mg at 11/01/15 1013   ??? hydrALAZINE (APRESOLINE) 20 mg/mL injection 10 mg  10 mg IntraVENous Q6H PRN Woodward Ku, MD       ??? diphenhydrAMINE (BENADRYL) capsule 25 mg  25 mg Oral Q6H PRN Betsy Pries, MD   25 mg at 11/01/15 0630   ??? ammonium lactate (LAC-HYDRIN) 12 % lotion   Topical BID Betsy Pries, MD        ??? sodium chloride (NS) flush 5-10 mL  5-10 mL IntraVENous PRN Alben Deeds, CRNA       ??? naloxone (NARCAN) injection 0.1 mg  0.1 mg IntraVENous PRN Alben Deeds, CRNA       ??? glucose chewable tablet 16 g  4 Tab Oral PRN Alben Deeds, CRNA       ??? glucagon (GLUCAGEN) injection 1 mg  1 mg IntraMUSCular PRN Alben Deeds, CRNA       ??? sodium citrate 4 gram /100 mL (4 %) 0.08 g  2 mL Does Not Apply DIALYSIS PRN Loreli Dollar, MD       ??? calcium acetate (PHOSLO) capsule 667 mg  1 Cap Oral TID WITH MEALS Wanda Plump, MD   667 mg at 10/31/15 1740   ??? folic acid (FOLVITE) tablet 1 mg  1 mg Oral DAILY Wanda Plump, MD   1 mg at 10/31/15 1610   ??? hydroxyurea (HYDREA) chemo cap 500 mg  500 mg Oral DAILY Wanda Plump, MD   500 mg at 10/31/15 0953   ??? sodium citrate 4 gram /100 mL (4 %) 0.08 g  2 mL Hemodialysis DIALYSIS TUE, THU & SAT Loreli Dollar, MD   Stopped at 10/29/15 0900   ??? ondansetron (ZOFRAN) injection 4 mg  4 mg IntraVENous Q6H PRN Ouida Sills, MD   4 mg at 11/01/15 1013        Physical Exam:     Physical Exam:   General:  Alert, cooperative, no distress, appears stated age.   Neck: Supple, symmetrical, trachea midline, no adenopathy, thyroid: no enlargement/tenderness/nodules, no carotid bruit and no JVD.   Lungs:   Clear to auscultation bilaterally.   Heart:  Regular rate and rhythm, S1, S2 normal, no murmur, click, rub or gallop.   Abdomen:   Soft, non-tender. Bowel sounds normal. No masses,  No organomegaly.   Extremities: Extremities normal, atraumatic, no cyanosis or edema, Right arm AVF has good thrill & Bruit.   Pulses:  2+ and symmetric all extremities.   Skin: Skin color, texture, turgor normal. No rashes or lesions         Data Review:    CBC w/Diff    Recent Labs      10/30/15   0423   WBC  3.2*   RBC  2.56*   HGB  7.6*   HCT  24.1*   MCV  94.1   MCH  29.7   MCHC  31.5   RDW  19.1*    Recent Labs      10/30/15   0423   MONOS  11*   EOS  1   BASOS  0   RDW  19.1*         Comprehensive Metabolic Profile    Recent Labs      10/30/15   1345   NA  138   K  4.0   CL  104   CO2  27   BUN  43*   CREA  6.28*    Recent Labs      10/30/15   1345  10/30/15   0423   CA  7.4*   --    PHOS   --   3.1   ALB   --   1.7*   TP   --   6.3*   SGOT   --   19   TBILI   --   0.5                        Impression:       Active Hospital Problems    Diagnosis Date Noted   ??? Secondary hyperparathyroidism of renal origin (HCC) 10/18/2015   ??? HIV (human immunodeficiency virus infection) (HCC) 10/17/2015   ??? Renal failure 10/16/2015   ??? ESRD (end stage renal disease) on dialysis (HCC) 07/06/2012   ??? HTN (hypertension) 07/06/2012   ??? Sickle cell anemia with pain (HCC) 07/05/2012            Plan:   DC IVF, Vasotec 1.25 mg IV q 6 hour PRN for SBP  > 180 mm.hg.Start oral med , dialysis tomorrow, arrange for removal of HD catheters, Encouraged him to avoid Pain medicine as much as possible.      Loreli DollarMosta Broderic Bara, MD

## 2015-11-01 NOTE — Progress Notes (Signed)
Hospitalist Progress Note    Patient: Tyler Ballard MRN: [id number]  CSN: [id number]    Date of Birth: 1981/09/04  Age: 34 y.o.  Sex: male    DOA: 10/16/2015 LOS:  LOS: 16 days          Patient sitting in bed, c/o nausea    Assessment/Plan     1. Brief cardiac arrest possible due to Hyperkalemia: s/p resuscitation.    2. Hyperkalemia: improved  3. ESRD / HD per nephrology.  HD as per Nephro  4. Acute resp failure: s/p intubation in OR, s/p extubation   5. HIV/AIDS: asymptomatic, no infections:resume bactrim at discharge if K remains stable   6. Non-functioning HD access - s/p HD catheter X 3 placement by vascular and all had break and cant use them, he had 3 bleeding episodes and RRT's.  Pt totally had 4 cath leak and bleeding this hospitalization.   7. Pancytopenia in the setting of HIV. - needs OP f/u with ID  8. Acute blood loss Anemia due to bleeding from broken  HD cath: s/p 5 units transfused      9. HTN: monitor BP  Dispo- home when OK with Nephro, Dialysis catheters will need to be removed on Monday.  D/w RN, d/w Dr Ardelle Park, d/w RN      Case discussed with:  Patient  Family  Nursing  Case Management  DVT Prophylaxis:  Lovenox  Hep SQ  SCDs  Coumadin   On Heparin gtt    Vital signs/Intake and Output:  Visit Vitals   ??? BP (!) 190/98   ??? Pulse 88   ??? Temp 97.8 ??F (36.6 ??C)   ??? Resp 19   ??? Ht  (1.702 m)   ??? Wt 100.9 kg (222 lb 7.1 oz)   ??? SpO2 97%   ??? BMI 34.84 kg/m2       General:  Awake, alert  Cardiovascular:  S1S2+, RRR  Pulmonary:  CTA b/l  GI:  Soft, BS+, NT, ND  Extremities:  trace edema          Medications Reviewed      Labs: Results:       Chemistry Recent Labs      10/30/15   1345  10/30/15   0423   GLU  112*   --    NA  138   --    K  4.0   --    CL  104   --    CO2  27   --    BUN  43*   --    CREA  6.28*   --    CA  7.4*   --    AGAP  7   --    BUCR  7*   --    AP   --   116   TP   --   6.3*   ALB   --   1.7*   GLOB   --   4.6*   AGRAT   --   0.4*      CBC w/Diff Recent Labs       10/30/15   0423   WBC  3.2*   RBC  2.56*   HGB  7.6*   HCT  24.1*   PLT  61*   GRANS  68   LYMPH  20*   EOS  1      Cardiac Enzymes No results for input(s): CPK, CKND1, MYO  in the last 72 hours.    No lab exists for component: CKRMB, TROIP   Coagulation Recent Labs      11/01/15   0615  10/31/15   1230   PTP  14.9  15.5*   INR  1.2  1.3*       Lipid Panel Lab Results   Component Value Date/Time    CHOLESTEROL, TOTAL 85 11/29/2012 09:36 AM    HDL CHOLESTEROL 33 11/29/2012 09:36 AM    LDL, CALCULATED 41.2 11/29/2012 09:36 AM    VLDL, CALCULATED 10.8 11/29/2012 09:36 AM    TRIGLYCERIDE 54 11/29/2012 09:36 AM    CHOL/HDL RATIO 2.6 11/29/2012 09:36 AM      BNP No results for input(s): BNPP in the last 72 hours.   Liver Enzymes Recent Labs      10/30/15   0423   TP  6.3*   ALB  1.7*   AP  116   SGOT  19      Thyroid Studies Lab Results   Component Value Date/Time    TSH 3.63 10/28/2015 03:10 AM        Procedures/imaging: see electronic medical records for all procedures/Xrays and details which were not copied into this note but were reviewed prior to creation of Plan.Jonah Blue-    Esaw Knippel, MD

## 2015-11-02 LAB — PROTHROMBIN TIME + INR
INR: 1.2 (ref 0.8–1.2)
Prothrombin time: 15.1 s (ref 11.5–15.2)

## 2015-11-02 LAB — MAGNESIUM: Magnesium: 2.2 mg/dL (ref 1.8–2.4)

## 2015-11-02 LAB — METABOLIC PANEL, COMPREHENSIVE
A-G Ratio: 0.4 — ABNORMAL LOW (ref 0.8–1.7)
ALT (SGPT): 6 U/L — ABNORMAL LOW (ref 16–61)
AST (SGOT): 21 U/L (ref 15–37)
Albumin: 2.1 g/dL — ABNORMAL LOW (ref 3.4–5.0)
Alk. phosphatase: 127 U/L — ABNORMAL HIGH (ref 45–117)
Anion gap: 10 mmol/L (ref 3.0–18)
BUN/Creatinine ratio: 6 — ABNORMAL LOW (ref 12–20)
BUN: 38 MG/DL — ABNORMAL HIGH (ref 7.0–18)
Bilirubin, total: 0.5 MG/DL (ref 0.2–1.0)
CO2: 25 mmol/L (ref 21–32)
Calcium: 7.6 MG/DL — ABNORMAL LOW (ref 8.5–10.1)
Chloride: 106 mmol/L (ref 100–108)
Creatinine: 6.73 MG/DL — ABNORMAL HIGH (ref 0.6–1.3)
GFR est AA: 11 mL/min/{1.73_m2} — ABNORMAL LOW (ref >60)
GFR est non-AA: 9 mL/min/{1.73_m2} — ABNORMAL LOW (ref >60)
Globulin: 5.8 g/dL — ABNORMAL HIGH (ref 2.0–4.0)
Glucose: 78 mg/dL (ref 74–99)
Potassium: 4 mmol/L (ref 3.5–5.5)
Protein, total: 7.9 g/dL (ref 6.4–8.2)
Sodium: 141 mmol/L (ref 136–145)

## 2015-11-02 LAB — CBC WITH AUTOMATED DIFF
ABS. BASOPHILS: 0 10*3/uL (ref 0.0–0.06)
ABS. EOSINOPHILS: 0.1 10*3/uL (ref 0.0–0.4)
ABS. LYMPHOCYTES: 0.7 10*3/uL — ABNORMAL LOW (ref 0.9–3.6)
ABS. MONOCYTES: 0.3 10*3/uL (ref 0.05–1.2)
ABS. NEUTROPHILS: 1.3 10*3/uL — ABNORMAL LOW (ref 1.8–8.0)
BASOPHILS: 0 % (ref 0–2)
EOSINOPHILS: 5 % (ref 0–5)
HCT: 29.5 % — ABNORMAL LOW (ref 36.0–48.0)
HGB: 9.1 g/dL — ABNORMAL LOW (ref 13.0–16.0)
LYMPHOCYTES: 30 % (ref 21–52)
MCH: 29.2 PG (ref 24.0–34.0)
MCHC: 30.8 g/dL — ABNORMAL LOW (ref 31.0–37.0)
MCV: 94.6 FL (ref 74.0–97.0)
MONOCYTES: 13 % — ABNORMAL HIGH (ref 3–10)
MPV: 10.2 FL (ref 9.2–11.8)
NEUTROPHILS: 52 % (ref 40–73)
PLATELET: 88 10*3/uL — ABNORMAL LOW (ref 135–420)
RBC: 3.12 M/uL — ABNORMAL LOW (ref 4.70–5.50)
RDW: 18.5 % — ABNORMAL HIGH (ref 11.6–14.5)
WBC: 2.5 10*3/uL — ABNORMAL LOW (ref 4.6–13.2)

## 2015-11-02 LAB — PHOSPHORUS: Phosphorus: 3.8 MG/DL (ref 2.5–4.9)

## 2015-11-02 LAB — GLUCOSE, POC: Glucose (POC): 89 mg/dL (ref 70–110)

## 2015-11-02 MED ORDER — PROMETHAZINE 25 MG/ML INJECTION
25 mg/mL | Freq: Four times a day (QID) | INTRAMUSCULAR | Status: DC | PRN
Start: 2015-11-02 — End: 2015-11-03
  Administered 2015-11-02 – 2015-11-03 (×5): via INTRAVENOUS

## 2015-11-02 MED FILL — PROMETHAZINE 25 MG/ML INJECTION: 25 mg/mL | INTRAMUSCULAR | Qty: 0.5

## 2015-11-02 MED FILL — INSULIN LISPRO 100 UNIT/ML INJECTION: 100 unit/mL | SUBCUTANEOUS | Qty: 1

## 2015-11-02 MED FILL — CALCIUM ACETATE 667 MG CAP: 667 mg | ORAL | Qty: 1

## 2015-11-02 MED FILL — DILAUDID (PF) 2 MG/ML INJECTION SOLUTION: 2 mg/mL | INTRAMUSCULAR | Qty: 1

## 2015-11-02 MED FILL — NICOTINE 14 MG/24 HR DAILY PATCH: 14 mg/24 hr | TRANSDERMAL | Qty: 1

## 2015-11-02 MED FILL — HYDROXYUREA 500 MG CAPSULE: 500 mg | ORAL | Qty: 1

## 2015-11-02 MED FILL — TRAZODONE 50 MG TAB: 50 mg | ORAL | Qty: 1

## 2015-11-02 MED FILL — FAMOTIDINE (PF) 20 MG/2 ML IV: 20 mg/2 mL | INTRAVENOUS | Qty: 2

## 2015-11-02 MED FILL — PROCRIT 10,000 UNIT/ML INJECTION SOLUTION: 10000 unit/mL | INTRAMUSCULAR | Qty: 1

## 2015-11-02 MED FILL — CLONIDINE 0.1 MG TAB: 0.1 mg | ORAL | Qty: 1

## 2015-11-02 MED FILL — ONDANSETRON (PF) 4 MG/2 ML INJECTION: 4 mg/2 mL | INTRAMUSCULAR | Qty: 2

## 2015-11-02 NOTE — Consults (Signed)
Interventional Radiology      [facility] 10/[facility]/15. We can remove this catheter today but cannot remove the groin catheter as this was placed by Vascular surgery. Will add patient to schedule for removal of the left chest TDC, defer removal of groin catheter to Vascular surgery.    Denita LungAshley Lilliemae Fruge, PA-C

## 2015-11-02 NOTE — Progress Notes (Signed)
Interventional Radiology      Patient in dialysis, will not finish until roughly 5PM per HD technologist. As we are not permitted to remove catheter while patient actively having HD treatment, will plan to remove catheter first thing tomorrow AM in IR lab. Discussed with patient's attending physician Dr Sherlynn Stallsalur.    Denita LungAshley Khalessi Blough, PA-C

## 2015-11-02 NOTE — Progress Notes (Signed)
Dialysis today, (L) IJ  TDC catheter to be removed by Interventional Radiology & (R) groin catheter by Vascular surgery  ,discussed with them. [facility]. He can be discharged once Dialyzed  Today & both catheters are  Taken out. He has functional AVF on right arm.

## 2015-11-02 NOTE — Other (Signed)
Pt off unit to dialysis.

## 2015-11-02 NOTE — Other (Signed)
Report received from Mirian MoAllison Pierce, RN. Pt resting quietly, jail guard at the bedside. Denies any other needs at this time. Callbell within reach.

## 2015-11-02 NOTE — Progress Notes (Signed)
Pt refused blood sugar.

## 2015-11-02 NOTE — Progress Notes (Signed)
RENAL PROGRESS NOTE: Pt seen on dialysis.  Follow up of ESRD     Present on Admission:  ??? ESRD (end stage renal disease) on dialysis  Clinic [facility])  ??? HTN (hypertension)  ??? Sickle cell anemia with pain (HCC)  ??? Secondary hyperparathyroidism of renal origin [facility])         Subjective: Doing ok, no SOB    Patient is on Dialysis.     Objective:    Patient Vitals for the past 12 hrs:   Temp Pulse Resp BP SpO2   11/02/15 1201 98.3 ??F (36.8 ??C) 97 18 157/68 93 %   11/02/15 0817 97.7 ??F (36.5 ??C) 96 18 199/67 94 %   11/02/15 0400 97.9 ??F (36.6 ??C) 90 18 133/67 97 %        Intake/Output Summary (Last 24 hours) at 11/02/15 1233  Last data filed at 11/02/15 1001   Gross per 24 hour   Intake    770 ml   Output      0 ml   Net    770 ml       Physical Assessment:     General Appearance: NAD  Lung: clear to auscultation  Heart: regular rate and rhythm and no murmurs, clicks or gallops  Lower Extremities: trace  edema   Access: AVF on right arm    Labs    CBC w/Diff    Recent Labs      11/02/15   0835   WBC  2.5*   RBC  3.12*   HGB  9.1*   HCT  29.5*   MCV  94.6   MCH  29.2   MCHC  30.8*   RDW  18.5*    Recent Labs      11/02/15   0835   MONOS  13*   EOS  5   BASOS  0   RDW  18.5*        Comprehensive Metabolic Profile    Recent Labs      11/02/15   0835  10/30/15   1345   NA  141  138   K  4.0  4.0   CL  106  104   CO2  25  27   BUN  38*  43*   CREA  6.73*  6.28*    Recent Labs      11/02/15   0835  10/30/15   1345   CA  7.6*  7.4*   PHOS  3.8   --    ALB  2.1*   --    TP  7.9   --    SGOT  21   --    TBILI  0.5   --           Basic Metabolic Profile       results  reviwed.          MEDS:Reviwed.  Current Facility-Administered Medications   Medication Dose Route Frequency Provider Last Rate Last Dose   ??? enalaprilat (VASOTEC) injection 1.25 mg  1.25 mg IntraVENous Q6H PRN Loreli Dollar, MD   1.25 mg at 11/01/15 1251   ??? amLODIPine (NORVASC) tablet 10 mg  10 mg Oral DAILY Loreli Dollar, MD   Stopped at 11/02/15 0900    ??? HYDROmorphone (PF) (DILAUDID) injection 1 mg  1 mg IntraVENous Q6H PRN Jonah Blue, MD   1 mg at 11/02/15 0909   ??? promethazine (PHENERGAN) 12.5 mg in 0.9% sodium chloride 50 mL IVPB  12.5 mg IntraVENous Q6H PRN Jonah BlueVikas Thakral, MD 200 mL/hr at 11/02/15 1021 12.5 mg at 11/02/15 1021   ??? famotidine (PF) (PEPCID) 20 mg in sodium chloride 0.9 % 10 mL injection  20 mg IntraVENous QHS Elayne GuerinWilliam Johnson III, MD   20 mg at 11/01/15 2137   ??? doxercalciferol (HECTOROL) 4 mcg/2 mL injection 1 mcg  1 mcg IntraVENous DIALYSIS MON, WED & Serafina RoyalsFRI Arnol Mcgibbon, MD   1 mcg at 10/30/15 1715   ??? epoetin alfa (EPOGEN;PROCRIT) 15,000 Units  15,000 Units IntraVENous DIALYSIS MON, WED & Serafina RoyalsFRI Abisai Coble, MD   15,000 Units at 10/30/15 1700   ??? insulin lispro (HUMALOG) injection   SubCUTAneous AC&HS Wanda Plumpirghayu Desai, MD   Stopped at 10/30/15 2200   ??? albuterol-ipratropium (DUO-NEB) 2.5 MG-0.5 MG/3 ML  3 mL Nebulization Q6H PRN Terrance MassAmit D Patel, MD       ??? nicotine (NICODERM CQ) 14 mg/24 hr patch 1 Patch  1 Patch TransDERmal DAILY Rollen SoxLynne M Maka, PA   1 Patch at 11/02/15 [id number]   ??? phenol throat spray (CHLORASEPTIC) 1 Spray  1 Spray Oral PRN Miranda Tamok, PA       ??? sodium chloride (NS) flush 5-10 mL  5-10 mL IntraVENous Q8H Miquel Dunnoderick L Myers, CRNA   10 mL at 11/02/15 0910   ??? sodium chloride (NS) flush 5-10 mL  5-10 mL IntraVENous PRN Miquel Dunnoderick L Myers, CRNA   10 mL at 10/28/15 1510   ??? dextrose (D50W) injection syrg 12.5-25 g  25-50 mL IntraVENous PRN Willaim BaneMiranda Tamok, PA   25 g at 10/28/15 0013   ??? heparin (porcine) 100 unit/mL injection 300 Units  300 Units InterCATHeter PRN Chanda Busingmitri Samoilov, MD       ??? 0.9% sodium chloride infusion 250 mL  250 mL IntraVENous PRN Loreli DollarMosta Ranette Luckadoo, MD       ??? traZODone (DESYREL) tablet 50 mg  50 mg Oral QHS PRN Woodward KuBasavalinga S Talur-Matadha, MD   50 mg at 11/01/15 2201   ??? cloNIDine HCl (CATAPRES) tablet 0.1 mg  0.1 mg Oral Q2H PRN Woodward KuBasavalinga S Talur-Matadha, MD   0.1 mg at 11/01/15 1013    ??? hydrALAZINE (APRESOLINE) 20 mg/mL injection 10 mg  10 mg IntraVENous Q6H PRN Woodward KuBasavalinga S Talur-Matadha, MD   10 mg at 11/01/15 1812   ??? diphenhydrAMINE (BENADRYL) capsule 25 mg  25 mg Oral Q6H PRN Betsy PriesEmily E Schley, MD   25 mg at 11/01/15 0630   ??? ammonium lactate (LAC-HYDRIN) 12 % lotion   Topical BID Betsy PriesEmily E Schley, MD       ??? sodium chloride (NS) flush 5-10 mL  5-10 mL IntraVENous PRN Alben DeedsEbony S McNeal, CRNA       ??? naloxone (NARCAN) injection 0.1 mg  0.1 mg IntraVENous PRN Alben DeedsEbony S McNeal, CRNA       ??? glucose chewable tablet 16 g  4 Tab Oral PRN Alben DeedsEbony S McNeal, CRNA       ??? glucagon (GLUCAGEN) injection 1 mg  1 mg IntraMUSCular PRN Alben DeedsEbony S McNeal, CRNA       ??? sodium citrate 4 gram /100 mL (4 %) 0.08 g  2 mL Does Not Apply DIALYSIS PRN Loreli DollarMosta Zymere Patlan, MD       ??? calcium acetate (PHOSLO) capsule 667 mg  1 Cap Oral TID WITH MEALS Wanda Plumpirghayu Desai, MD   667 mg at 11/02/15 0908   ??? folic acid (FOLVITE) tablet 1 mg  1 mg Oral DAILY Wanda Plumpirghayu Desai, MD   Stopped  at 11/02/15 0900   ??? hydroxyurea (HYDREA) chemo cap 500 mg  500 mg Oral DAILY Wanda Plump, MD   Stopped at 11/02/15 0900   ??? sodium citrate 4 gram /100 mL (4 %) 0.08 g  2 mL Hemodialysis DIALYSIS TUE, THU & SAT Loreli Dollar, MD   Stopped at 10/29/15 0900   ??? ondansetron (ZOFRAN) injection 4 mg  4 mg IntraVENous Q6H PRN Ouida Sills, MD   4 mg at 11/01/15 1013       Impression:    ESRD: Tolerating HD well.  Anemia of CKD: on  Epogen.  Hyperparathyroidism Yes  Hypertension,  Plan  Dialysis for volume and solute management  Epogen  15000 units.  Hectorol: as ordered.   Catheters removal post dialysis today.        Loreli Dollar, MD

## 2015-11-02 NOTE — Progress Notes (Signed)
Hospitalist Progress Note    Patient: Tyler Ballard MRN: [id number]  CSN: [id number]    Date of Birth: [birth year]  Age: 34 y.o.  Sex: male    DOA: 10/16/2015 LOS:  LOS: 17 days          Patient feeling fine, seen in HD.  BP high due to no BP med's given this am due to HD    Assessment/Plan     1. Brief cardiac arrest possible due to Hyperkalemia: s/p resuscitation.    2. Hyperkalemia: improved  3. ESRD / HD per nephrology.  HD as per Nephro  4. Acute resp failure: s/p intubation in OR, s/p extubation   5. HIV/AIDS: asymptomatic, no infections: resume bactrim at discharge if K remains stable   6. Non-functioning HD access - s/p HD catheter X 3 placement by vascular and all had break and cant use them, he had 3 bleeding episodes and RRT's. Pt totally had 4 cath leak and bleeding this hospitalization.   7. Pancytopenia in the setting of HIV. - needs OP f/u with ID  8. Acute blood loss Anemia due to bleeding from broken  HD cath: s/p 5 units transfused      9. HTN: BP high, will add clonidine with holding parameters   Dispo: back to jail in am after old HD cath are pulled out.  D/w RN, d/w Dr Ardelle Parkhaque  D/w IR and Vas Sx, will take cath out in am due to pt in HD today.   D/w pt      Case discussed with:  Patient  Family  Nursing  Case Management  DVT Prophylaxis:  Lovenox  Hep SQ  SCDs  Coumadin   On Heparin gtt    Vital signs/Intake and Output:  Visit Vitals   ??? BP (!) 207/103   ??? Pulse 90   ??? Temp 98.3 ??F (36.8 ??C) (Oral)   ??? Resp 18   ??? Ht 5\' 7"  (1.702 m)   ??? Wt 102.4 kg (225 lb 12 oz)   ??? SpO2 93%   ??? BMI 35.36 kg/m2       General:  Awake, alert  Cardiovascular:  S1S2+, RRR  Pulmonary:  CTA b/l  GI:  Soft, BS+, NT, ND  Extremities: no edema, dry skin           Medications Reviewed      Labs: Results:       Chemistry Recent Labs      11/02/15   0835   GLU  78   NA  141   K  4.0   CL  106   CO2  25   BUN  38*   CREA  6.73*   CA  7.6*   AGAP  10   BUCR  6*   AP  127*   TP  7.9   ALB  2.1*   GLOB  5.8*    AGRAT  0.4*      CBC w/Diff Recent Labs      11/02/15   0835   WBC  2.5*   RBC  3.12*   HGB  9.1*   HCT  29.5*   PLT  88*   GRANS  52   LYMPH  30   EOS  5      Cardiac Enzymes No results for input(s): CPK, CKND1, MYO in the last 72 hours.    No lab exists for component: CKRMB, TROIP   Coagulation Recent Labs  11/02/15   0835  11/01/15   0615   PTP  15.1  14.9   INR  1.2  1.2       Lipid Panel Lab Results   Component Value Date/Time    CHOLESTEROL, TOTAL 85 11/29/2012 09:36 AM    HDL CHOLESTEROL 33 11/29/2012 09:36 AM    LDL, CALCULATED 41.2 11/29/2012 09:36 AM    VLDL, CALCULATED 10.8 11/29/2012 09:36 AM    TRIGLYCERIDE 54 11/29/2012 09:36 AM    CHOL/HDL RATIO 2.6 11/29/2012 09:36 AM      BNP No results for input(s): BNPP in the last 72 hours.   Liver Enzymes Recent Labs      11/02/15   0835   TP  7.9   ALB  2.1*   AP  127*   SGOT  21      Thyroid Studies Lab Results   Component Value Date/Time    TSH 3.63 10/28/2015 03:10 AM        Procedures/imaging: see electronic medical records for all procedures/Xrays and details which were not copied into this note but were reviewed prior to creation of Plan.Woodward Ku, MD

## 2015-11-02 NOTE — Other (Signed)
ACUTE HEMODIALYSIS FLOW SHEET    HEMODIALYSIS ORDERS: Physician: Dr. Jannette Fogo     Dialyzer: Revaclear         Duration: 3.5 hr  BFR: 350   DFR: 800   Dialysate:  Temp 37.0 K+ 2.0    Ca+3.0  Na 140  Bicarb 35   Weight:  104.3 kg    Bed Scale      Unable to Obtain        Dry weight/UF Goal: 2500   Access Right upper arm AVF   Needle Gauge 15 gauge 1 inch     Heparin   Bolus      Units     Hourly       Units    None     Catheter locking solution N/A   Pre BP: 200/101    Pulse:  90        Temperature: 98.3    Respirations: 18  Tx: NS       ml/Bolus  Other         N/A   Labs: Pre        Post:         N/A   Additional Orders(medications, blood products, hypotension management):        N/A      Time Out/Safety Check   [facility] Consent Verified     CATHETER ACCESS: N/A   Right   Left   IJ     Fem    First use X-ray verified     Tunnel                 Non Tunneled   No S/S infection  Redness  Drainage Cultured Swelling Pain   Medical Aseptic Prep Utilized   Dressing Changed   Biopatch  Date:       Clotted   Patent   Flows: Good  Poor  Reversed   If access problem, Dr. notified: Yes    N/A  Date:           GRAFT/FISTULA ACCESS:  N/A     Right     Left     UE     LE   AVG   AVF        Buttonhole    Medical Aseptic Prep Utilized   No S/S infection  Redness  Drainage Cultured Swelling Pain    Bruit:    Strong     Weak       Thrill :    Strong     Weak       Needle Gauge: 15   Length: 1   If access problem, Dr. notified: Yes     N/A  Date:        Please describe access if present and not used:       GENERAL ASSESSMENT:    LUNGS:  Rate 18 SaO2%         N/A     Clear   Coarse   Crackles   Wheezing         Diminished     Location : RLL   LLL    RUL  LUL   Cough: Productive  Dry  N/A   Respirations:  Easy  Labored   Therapy:  RA  NC  l/min    Mask: NRB Venti       O2%  Ventilator  Intubated   Trach   BiPaP   CARDIAC: Regular       Irregular    Pericardial Rub   JVD           Monitored   Bedside   Remotely monitored  N/A  Rhythm:    EDEMA:  None  Generalized   Pitting  1     2     3     4                   Facial   Pedal    UE   LE   SKIN:    Warm   Hot      Cold    Dry      Pale    Diaphoretic                   Flushed   Jaundiced   Cyanotic   Rash   Weeping   LOC:     Alert      Oriented:     Person      Place  Time                Confused   Lethargic   Medicated   Non-responsive     GI / ABDOMEN:   Flat     Distended     Soft     Firm     Obese                              Diarrhea   Bowel Sounds   Nausea   Vomiting      GU / URINE ASSESSMENT: Voiding    Oliguria   Anuria     Foley      Incontinent      Incontinent Brief        Bathroom Privileges     PAIN:  0 1  2   3   4   5   6   7   8   9   10             Scale 0-10  Action/Follow Up:    MOBILITY:   Amb     Amb/Assist     Bed     Wheelchair   Stretcher      All Vitals and Treatment Details on Attached Flowsheet       Hospital: [facility]           Room # 366      1st Time Acute   Stat   Routine   Urgent      Acute Room    Bedside   ICU/CCU   ER   Isolation Precautions:   Dialysis    Airborne    Contact     Reverse   Special Considerations:          Blood Consent Verified N/A     ALLERGIES:    NKA          Code Status:   Full Code   DNR   Other           HBsAg ONLY: Date Drawn 10/16/17        Negative Positive Unknown   HBsAb: Date 10/16/17     Susceptible    Immune10 Not Drawn   Drawn  Results for NASZIR, COTT (MRN 10/18/17) as of 11/02/2015  16:01   Ref. Range 10/17/2015 15:05   Hepatitis B surface Ag Latest Ref Range: <1.00 Index <0.10   Hep B surface Ag Interp. Latest Ref Range: NEG   NEGATIVE   Hepatitis B surface Ab Latest Ref Range: >10.0 mIU/mL 5.54 (L)   Hep B surface Ab Interp. Latest Ref Range: POS   NEGATIVE (A)   Hep B surface Ab comment Latest Units:   Samples with a  v...        Current Labs:    Date of Labs:           Today         Results for ANTONIN, MEININGER (MRN [id number]) as of 11/02/2015 16:01   Ref. Range 11/02/2015 08:35   WBC Latest Ref Range: 4.6 - 13.2 K/uL 2.5 (L)   RBC Latest Ref Range: 4.70 - 5.50 M/uL 3.12 (L)   HGB Latest Ref Range: 13.0 - 16.0 g/dL 9.1 (L)   HCT Latest Ref Range: 36.0 - 48.0 % 29.5 (L)   MCV Latest Ref Range: 74.0 - 97.0 FL 94.6   MCH Latest Ref Range: 24.0 - 34.0 PG 29.2   MCHC Latest Ref Range: 31.0 - 37.0 g/dL 30.8 (L)   RDW Latest Ref Range: 11.6 - 14.5 % 18.5 (H)   PLATELET Latest Ref Range: 135 - 420 K/uL 88 (L)   MPV Latest Ref Range: 9.2 - 11.8 FL 10.2   NEUTROPHILS Latest Ref Range: 40 - 73 % 52   LYMPHOCYTES Latest Ref Range: 21 - 52 % 30   MONOCYTES Latest Ref Range: 3 - 10 % 13 (H)   EOSINOPHILS Latest Ref Range: 0 - 5 % 5   BASOPHILS Latest Ref Range: 0 - 2 % 0   DF Latest Units:   AUTOMATED   ABS. NEUTROPHILS Latest Ref Range: 1.8 - 8.0 K/UL 1.3 (L)   ABS. LYMPHOCYTES Latest Ref Range: 0.9 - 3.6 K/UL 0.7 (L)   ABS. MONOCYTES Latest Ref Range: 0.05 - 1.2 K/UL 0.3   ABS. EOSINOPHILS Latest Ref Range: 0.0 - 0.4 K/UL 0.1   ABS. BASOPHILS Latest Ref Range: 0.0 - 0.06 K/UL 0.0   INR Latest Ref Range: 0.8 - 1.2   1.2   Prothrombin time Latest Ref Range: 11.5 - 15.2 sec 15.1   Sodium Latest Ref Range: 136 - 145 mmol/L 141   Potassium Latest Ref Range: 3.5 - 5.5 mmol/L 4.0   Chloride Latest Ref Range: 100 - 108 mmol/L 106   CO2 Latest Ref Range: 21 - 32 mmol/L 25   Anion gap Latest Ref Range: 3.0 - 18 mmol/L 10   Glucose Latest Ref Range: 74 - 99 mg/dL 78   BUN Latest Ref Range: 7.0 - 18 MG/DL 38 (H)   Creatinine Latest Ref Range: 0.6 - 1.3 MG/DL 6.73 (H)   BUN/Creatinine ratio Latest Ref Range: 12 - 20   6 (L)   Calcium Latest Ref Range: 8.5 - 10.1 MG/DL 7.6 (L)   Phosphorus Latest Ref Range: 2.5 - 4.9 MG/DL 3.8   Magnesium Latest Ref Range: 1.8 - 2.4 mg/dL 2.2   GFR est non-AA Latest Ref Range: >60 ml/min/1.14m2 9 (L)   GFR est AA Latest Ref Range: >60 ml/min/1.2m2 11 (L)    Bilirubin, total Latest Ref Range: 0.2 - 1.0 MG/DL 0.5   Protein, total Latest Ref Range: 6.4 - 8.2 g/dL 7.9   Albumin Latest Ref Range: 3.4 - 5.0 g/dL 2.1 (L)   Globulin Latest Ref  Range: 2.0 - 4.0 g/dL 5.8 (H)   A-G Ratio Latest Ref Range: 0.8 - 1.7   0.4 (L)   ALT Latest Ref Range: 16 - 61 U/L 6 (L)   AST Latest Ref Range: 15 - 37 U/L 21   Alk. phosphatase Latest Ref Range: 45 - 117 U/L 127 (H)                                                                                                                                 DIET:   Renal     Other      NPO       Diabetic      PRIMARY NURSE REPORT: First initial/Last name/Title      Pre Dialysis:S. Frederico Hamman, RN    Time: 1134      EDUCATION:     Patient  Other         Knowledge Basis: None Minimal  Substantial   Barriers to learning  N/A    Access Care      S&S of infection      Fluid Management     K+     Procedural    Albumin      Medications      Tx Options      Transplant      Diet      Other   Teaching Tools:   Explain   Demo   Handouts  Video  Patient response:    Verbalized understanding   Teach back   Return demonstration  Requires follow up   Inappropriate due to            Sims ??? Before each treatment:     Machine Number:                   [facility]                                   Unit Machine # 04 with centralized RO                                   Portable Machine #1/RO serial # [id number]                                   Portable Machine #2/RO serial # [id number]                                   Portable Machine #3/RO serial # [id number]  [place]                                   Portable Machine #11/RO serial # [id number]                                    Portable Machine #12/RO serial # [id number]                                   Portable Machine #13/RO serial #  [id number]       Alarm Test:  Pass time 1130       Other:          RO/Machine Log Complete      Temp   37.0            Extracorporeal Circuit Tested for integrity   Dialysate: pH 7.4 Conductivity: Meter 14.0     HD Machine  14.4    TCD: 19.9  Dialyzer Lot # [id number] 09/14/17           Blood Tubing Lot # [id number]            Saline Lot # [id number] 05/19/17     CHLORINE TESTING-Before each treatment and every 4 hours    Total Chlorine:  less than 0.1 ppm  Time: 1100 4 Hr/2nd Check Time:1400   (if greater than 0.1 ppm from Primary then every 30 minutes from Secondary)     TREATMENT INITIATION ??? with Dialysis Precautions:    All Connections Secured                  Saline Line Double Clamped    Venous Parameters Set                   Arterial Parameters Set     Prime Given  250 ml               Air Foam Detector Engaged      Treatment Initiation Note:   1300- Left upper arm AVF accessed without complications, b/p elevated, no distress or pain noted at this time, left chest wall [facility]  Dressing changed, not used and right groin TDC access with pigtail dressing changed, dialysis started without complications  2130- Dialysis delayed due to complications with dialysis machine  1400- Pt continues dialysis without complications noted at this time  1430- Pt continues dialysis, awake alert verbally responsive, denies pain at this time  1500- Pt reports 8/10 right groin pain, pt has c/o nausea as well, information reported to primary care RN   1530-  Pt continues dialysis   1600- Pt reports pain medication effective 3/10 right groin   1630- Noted increased resistance in venous side of dialysis needle, venous dialysis needle flushed directly, flushing with resistance noted, new venous dialysis needle placed, noted very slow blood return, no bounding noted, pt stated " I can feel that you are in there" when referring to venous site of fistula, unable to obtain functional site on venous portion  of fistula, treatment stopped and blood returned to prevent blood loss        Medication Dose Volume Route Initials Dialyzer Cleared:  Good  Fair   Poor    Blood processed:  68.4 L  UF  Removed 2125 mL    Post Wt: 102.4  kg  POst BP: 198/103       Pulse: 89      Respirations: 18  Temperature: 99.3      NaCl 0.9%              250 mL prime, 250 mL rinse      IV      JAC  Post Tx Vascular Access: AVF/AVG: Bleeding stopped Art 10 min. Ven. 5  Min         15000 units             3 mL     IV      JAC   Catheter: Locking solution: Heparin           1 mcg      0.5 mL        IV    JAC   Post Assessment:                                    Skin:   Warm   Dry  Diaphoretic     Flushed   Pale  Cyanotic   [facility] Signatures Title Initials  Time Lungs:  Clear     Course   Crackles   Wheezing  Diminished   Lucina Mellow RN  [facility]  Cardiac:  Regular    Irregular    Monitor   N/A  Rhythm:           Edema:   None     General      Facial    Pedal     UE     LE       Pain: 0  $'1  2   3  4   5   6   7   8   9   10         'u$ Post Treatment Note: 1700- Dr Jannette Fogo called and given report at this time, made aware that pt had 26 minutes of treatment timer left, complications during treatment and per pt " they are suppose to run a wire through the top part of the fistula", fistula positive for bruit and thrill on arterial and venous sides, pt returned to room, 2125 mL UF removed      POST TREATMENT PRIMARY NURSE HANDOFF REPORT:     First initial/Last name/Title         Post Dialysis: S. Frederico Hamman, RN  [phone]     Abbreviations: AVG-arterial venous graft, AVF-arterial venous fistula, IJ-Internal Jugular, Subcl-Subclavian, Fem-Femoral, Tx-treatment, AP/HR-apical heart rate, DFR-dialysate flow rate, BFR-blood flow rate, AP-arterial pressure, VP-venous pressure, UF-ultrafiltrate, TMP-transmembrane pressure, Ven-Venous, Art-Arterial, RO-Reverse Osmosis

## 2015-11-03 ENCOUNTER — Inpatient Hospital Stay: Payer: PRIVATE HEALTH INSURANCE | Primary: Family Medicine

## 2015-11-03 ENCOUNTER — Inpatient Hospital Stay
Admit: 2015-11-03 | Discharge: 2015-11-04 | Disposition: A | Discharge status: Home / Self Care | Payer: MEDICARE | Attending: Emergency Medicine

## 2015-11-03 ENCOUNTER — Emergency Department: Admit: 2015-11-04 | Payer: MEDICARE | Primary: Family Medicine

## 2015-11-03 DIAGNOSIS — T82838A Hemorrhage of vascular prosthetic devices, implants and grafts, initial encounter: Secondary | ICD-10-CM

## 2015-11-03 LAB — GLUCOSE, POC
Glucose (POC): 134 mg/dL — ABNORMAL HIGH (ref 70–110)
Glucose (POC): 93 mg/dL (ref 70–110)
Glucose (POC): 125 mg/dL — ABNORMAL HIGH (ref 70–110)

## 2015-11-03 LAB — PROTHROMBIN TIME + INR
INR: 1.3 — ABNORMAL HIGH (ref 0.8–1.2)
Prothrombin time: 15.6 s — ABNORMAL HIGH (ref 11.5–15.2)

## 2015-11-03 LAB — CULTURE, BLOOD
Culture result:: NO GROWTH
Culture result:: NO GROWTH

## 2015-11-03 MED ORDER — CLONIDINE 0.1 MG TAB
0.1 mg | Freq: Two times a day (BID) | ORAL | Status: DC
Start: 2015-11-03 — End: 2015-11-03
  Administered 2015-11-03: 22:00:00 via ORAL

## 2015-11-03 MED ORDER — TRIMETHOPRIM-SULFAMETHOXAZOLE 160 MG-800 MG TAB
160-800 mg | ORAL_TABLET | Freq: Two times a day (BID) | ORAL | 0 refills | Status: DC
Start: 2015-11-03 — End: 2015-11-03

## 2015-11-03 MED ORDER — TRAZODONE 50 MG TAB
50 mg | ORAL_TABLET | Freq: Every evening | ORAL | 0 refills | Status: AC | PRN
Start: 2015-11-03 — End: ?

## 2015-11-03 MED ORDER — AMLODIPINE 10 MG TAB
10 mg | ORAL_TABLET | Freq: Every day | ORAL | 0 refills | Status: AC
Start: 2015-11-03 — End: ?

## 2015-11-03 MED ORDER — TRIMETHOPRIM-SULFAMETHOXAZOLE 160 MG-800 MG TAB
160-800 mg | ORAL_TABLET | ORAL | 0 refills | Status: AC
Start: 2015-11-03 — End: ?

## 2015-11-03 MED FILL — CALCIUM ACETATE 667 MG CAP: 667 mg | ORAL | Qty: 1

## 2015-11-03 MED FILL — DILAUDID (PF) 2 MG/ML INJECTION SOLUTION: 2 mg/mL | INTRAMUSCULAR | Qty: 1

## 2015-11-03 MED FILL — HYDROXYUREA 500 MG CAPSULE: 500 mg | ORAL | Qty: 1

## 2015-11-03 MED FILL — PROMETHAZINE 25 MG/ML INJECTION: 25 mg/mL | INTRAMUSCULAR | Qty: 0.5

## 2015-11-03 MED FILL — ONDANSETRON (PF) 4 MG/2 ML INJECTION: 4 mg/2 mL | INTRAMUSCULAR | Qty: 2

## 2015-11-03 MED FILL — TRAZODONE 50 MG TAB: 50 mg | ORAL | Qty: 1

## 2015-11-03 MED FILL — AMLODIPINE 10 MG TAB: 10 mg | ORAL | Qty: 1

## 2015-11-03 MED FILL — DIPHENHYDRAMINE 25 MG CAP: 25 mg | ORAL | Qty: 1

## 2015-11-03 MED FILL — CLONIDINE 0.1 MG TAB: 0.1 mg | ORAL | Qty: 1

## 2015-11-03 MED FILL — FOLIC ACID 1 MG TAB: 1 mg | ORAL | Qty: 1

## 2015-11-03 MED FILL — FAMOTIDINE (PF) 20 MG/2 ML IV: 20 mg/2 mL | INTRAVENOUS | Qty: 2

## 2015-11-03 MED FILL — HYDRALAZINE 20 MG/ML IJ SOLN: 20 mg/mL | INTRAMUSCULAR | Qty: 1

## 2015-11-03 MED FILL — NICOTINE 14 MG/24 HR DAILY PATCH: 14 mg/24 hr | TRANSDERMAL | Qty: 1

## 2015-11-03 NOTE — Discharge Summary (Signed)
Discharge Summary    Patient: Tyler Ballard MRN: [id number]  CSN: [id number]    Date of Birth: 17-Oct-1981  Age: 34 y.o.  Sex: male    DOA: 10/16/2015 LOS:  LOS: 18 days   Discharge Date:      Admission Diagnoses: ESRD (end stage renal disease) (HCC) [N18.6]    Discharge Diagnoses:  PLEASE SEE DICTATION.     Discharge Condition: Stable    PHYSICAL EXAM  Visit Vitals   ??? BP 160/79   ??? Pulse 90   ??? Temp 99.4 ??F (37.4 ??C) (Tympanic)   ??? Resp 18   ??? Ht 5\' 7"  (1.702 m)   ??? Wt 101.2 kg (223 lb 1.7 oz)   ??? SpO2 100%   ??? BMI 34.94 kg/m2       General: Alert, cooperative, no acute distress????  Lungs:  CTA Bilaterally. No Wheezing/Rhonchi/Rales.  Heart:  Regular rate and Rhythm.  Abdomen: Soft, Non distended, Non tender. + Bowel sounds.  Extremities: R arm AV fistula good thrill. No edema/ cyanosis/ clubbing  Neurologic:?? AA oriented X 3. Moves all extremities.    Chest and groin HD cath removed and dressing clean.                               Hospital Course: Please see dictation. code # [id number].      Discharge Medications:     Current Discharge Medication List      START taking these medications    Details   amLODIPine (NORVASC) 10 mg tablet Take 1 Tab by mouth daily.  Qty: 30 Tab, Refills: 0      traZODone (DESYREL) 50 mg tablet Take 1 Tab by mouth nightly as needed for Sleep.  Qty: 20 Tab, Refills: 0      trimethoprim-sulfamethoxazole (BACTRIM DS) 160-800 mg per tablet Take 1 Tab by mouth BID Mon Wed & Fri. After HD  Qty: 30 Tab, Refills: 0         CONTINUE these medications which have NOT CHANGED    Details   hydroxyurea (HYDREA) 500 mg capsule Take 500 mg by mouth daily.      folic acid (FOLVITE) 1 mg tablet Take 1 mg by mouth daily.      cloNIDine HCl (CATAPRES) 0.1 mg tablet Take 1 tablet by mouth two (2) times a day.  Qty: 60 tablet, Refills: 0      lisinopril (PRINIVIL, ZESTRIL) 10 mg tablet Take 1 tablet by mouth daily.  Qty: 30 tablet, Refills: 0       famotidine (PEPCID) 20 mg tablet Take 1 tablet by mouth daily.  Qty: 30 tablet, Refills: 0      epoetin alfa (EPOGEN) 10,000 unit/mL injection by SubCUTAneous route once.      calcium acetate (PHOSLO) 667 mg cap Take  by mouth three (3) times daily (with meals).           ?? It is important that you take the medication exactly as they are prescribed.   ?? Keep your medication in the bottles provided by the pharmacist and keep a list of the medication names, dosages, and times to be taken in your wallet.   ?? Do not take other medications without consulting your doctor.     DIET:  Renal Diet    ACTIVITY: Activity as tolerated    ADDITIONAL INFORMATION: If you experience any of the following symptoms but not limited to Fever,  chills, nausea, vomiting, diarrhea, change in mentation, falling, bleeding, shortness of breath, chest pain, please call your primary care physician or return to the emergency room if you cannot get hold of your doctor:     FOLLOW UP CARE:  Physician at [facility] in 7-10 days. Please call and set up an appointment.  [facility] in 2 week      Minutes spent on discharge: 40 minutes spent coordinating this discharge (review instructions/follow-up, prescriptions, preparing report for sign off)    Woodward Ku, MD  11/03/2015 3:59 PM

## 2015-11-03 NOTE — Progress Notes (Signed)
Interventional Radiology      Patient in department for tunneled left chest dialysis catheter removal. Patient reports fistula malfunctioned near end of dialysis treatment, unable to complete full treatment. This is documented in HD flowsheet, but does not note whether it is still OK to remove catheter if patient may be left without access. Several pages out to Dr Ardelle ParkHaque to confirm if it is OK to remove catheter despite malfunctioning fistula. Awaiting call back. Will remove later today once it is confirmed OK to remove. Thank you.    Denita LungAshley Tashima Scarpulla, PA-C

## 2015-11-03 NOTE — ED Triage Notes (Signed)
By EMS for bleeding from vascular access, discharged today  From inpt unit after placement of vascular access devices. Pt has been picking and pulling lines. [facility] through graft in arm today. Jail requesting pt not be discharged back to them tonight as they dont have anywhere safe for him to be monitored until the morning. Pt discharged from unit, and bleeding from site before arrival to jail.

## 2015-11-03 NOTE — ED Provider Notes (Signed)
HPI Comments: 7:34 PM. Tyler Ballard is a 34 y.o. male with a history of HTN, AVN, ESRD on hemodialysis, HIV who presents to the ED c/o post surgical bleeding, which started today after his dialysis catheter was removed by a vascular surgeon, Dr. Ronne Binning. Patient states that he had some bleeding when he was in the doctor's office, but once they had placed a second bandage on his surgical site, the bleeding stopped. Once the patient made it back to the [facility], he and the police officer noticed he was actively bleeding again. Patient is c/o L sided chest pain that is exacerbated when he breathes and nausea. Also states "my L breast is swollen". Patient states that where his R groin catheter was removed today, there is no bleeding or any other complications. Reports that he was only dialyzed for one hour today. Patient denies fatigue, light-headedness, or any other pertinent sx. There are no other concerns at this time.      The history is provided by the patient.        Past Medical History:   Diagnosis Date   ??? AVN (avascular necrosis of bone) (HCC)      right hip   ??? Chronic kidney disease    ??? Chronic kidney disease (CKD), stage V (HCC)    ??? Dialysis patient [facility])    ??? ESRD on hemodialysis (HCC)    ??? HIV (human immunodeficiency virus infection) (HCC)    ??? HTN (hypertension) 07/06/2012   ??? HTN (hypertension)    ??? Hypertension    ??? Malingering      Sickle Cell testing negative   ??? Malingering      Patient states has had sickle cell disease since age of 61, hemoglobin electrophoresis at [facility] and [facility] both negative for for sickle cell disease   ??? Noncompliance of patient with renal dialysis (HCC) 09/06/2013   ??? Sickle cell anemia (HCC)      NEGATIVE test. Likely patient states this Dx for secondary gain   ??? Sickle cell disease (HCC)      TOLD BY PATIENT AND NOT TRUE   ??? Stroke (HCC)      2014 pts states he has weakness to right side upper and lower extremities         Past Surgical History:   Procedure Laterality Date   ??? Hx orthopaedic  2012     Rt hip decompression   ??? Hx orthopaedic       rt hip    ??? Hx vascular access       av  fistula right arm.   ??? Hx orthopaedic       RIGHT HIP   ??? Hx vascular access       old avg left arm, avf right arm   ??? Hx orthopaedic       right hip surgery   ??? Hx orthopaedic       right hip    ??? Vascular surgery procedure unlist       Dialysis Access         Family History:   Problem Relation Age of Onset   ??? Hypertension Mother    ??? Cancer Mother    ??? Sickle Cell Anemia Mother    ??? Hypertension Father    ??? Sickle Cell Anemia Father    ??? Sickle Cell Anemia Sister    ??? Sickle Cell Anemia Other        Social History  Social History   ??? Marital status: SINGLE     Spouse name: N/A   ??? Number of children: N/A   ??? Years of education: N/A     Occupational History   ??? Not on file.     Social History Main Topics   ??? Smoking status: Former Smoker   ??? Smokeless tobacco: Not on file   ??? Alcohol use No   ??? Drug use: No   ??? Sexual activity: No     Other Topics Concern   ??? Not on file     Social History Narrative    ** Merged History Encounter **         ** Merged History Encounter **         ** Merged History Encounter **         ** Merged History Encounter **         ** Merged History Encounter **              ALLERGIES: Review of patient's allergies indicates no known allergies.    Review of Systems   Constitutional: Negative for fever.   HENT: Negative for congestion.    Respiratory: Negative for cough.    Cardiovascular: Positive for chest pain (L breast ). Negative for leg swelling.   Gastrointestinal: Negative for nausea and vomiting.   Genitourinary: Negative for dysuria.   Musculoskeletal: Negative.    Skin: Positive for wound (bleeding from L breast surgical incision).        Swelling to the L breast    Neurological: Negative for light-headedness.   All other systems reviewed and are negative.      Vitals:     11/03/15 1915 11/03/15 2200 11/03/15 2245   BP: (!) 129/96 179/86 174/86   Pulse: (!) 107 96 92   Resp: 27 17 14    SpO2: 98% 98%             Physical Exam   Constitutional: He is oriented to person, place, and time.   uncomfortable   HENT:   Head: Atraumatic.   Eyes: Conjunctivae are normal.   Neck: Normal range of motion. Neck supple.   Cardiovascular: Intact distal pulses.    tachycardic   Pulmonary/Chest: Effort normal. No respiratory distress.   Diminished in bilateral bases. No distress  Wound to left chest wall. No active bleeding. Enlargement of left breast, ttp   Abdominal: Soft. Bowel sounds are normal. He exhibits no distension. There is no tenderness. There is no rebound and no guarding.   Musculoskeletal: Normal range of motion.   ttp of left proximal arm with some swelling noted   Neurological: He is alert and oriented to person, place, and time.   Skin: Skin is warm and dry.   Psychiatric: He has a normal mood and affect.   Nursing note and vitals reviewed.       MDM  Number of Diagnoses or Management Options  Diagnosis management comments: Tyler Ballard is a 34 y.o. male presenting with bleeding s/p port removal. No respiratory distress. Will check labs and monitor for worsening bleeding.     ED Course       Procedures    Vitals:  Patient Vitals for the past 12 hrs:   Pulse Resp BP SpO2   11/03/15 2245 92 14 174/86 -   11/03/15 2200 96 17 179/86 98 %   11/03/15 1915 (!) 107 27 (!) 129/96 98 %     98%  on RA, indicating adequate oxygenation.      EKG interpretation by ED Physician:  19:46. Sinus tachycardia with rate of 104 BPM. 1st degree AV block. Normal axis. No STEMI.      X-Ray, CT or other radiology findings or impressions:  XR CHEST PORT    (Results Pending)           Progress notes, Consult notes or additional Procedure notes:   Consult:  Discussed care with Dr. Lorine Bears, on call for Vascular Surgery. Standard discussion; including history of patient???s chief complaint,  available diagnostic results, and treatment course. He agrees with plan that if hemoglobin levels do not change or if swelling does not worsen. Patient can be discharged to follow-up in the office.   8:37 PM    11:16 PM I have discussed results of work up with patient.  Hgb stable. Pain controlled. Return precautions discussed. Patient stated verbal understanding and agrees with course and plan.       Disposition:  Diagnosis:   1. Other complication of vascular dialysis catheter [facility])        Disposition: Discharged in stable condition           Scribe Attestation:     I, Carolina Sink, scribing for and in the presence of Gustavo Lah, MD November 03, 2015 at 11:16 PM     Physician Attestation:   I personally performed the services described in this documentation, reviewed and edited the documentation which was dictated to the scribe in my presence, and it accurately records my words and actions. Gustavo Lah, MD  November 03, 2015 at 11:16 PM

## 2015-11-03 NOTE — Other (Signed)
IDR/SLIDR Summary          Patient: Delane Gingerlbert C Wescoat MRN: [id number]    Age: 34 y.o.     Birthdate: 06-10-1981 Room/Bed: 366/01   Admit Diagnosis: ESRD (end stage renal disease) (HCC) [N18.6]  Principal Diagnosis: <principal problem not specified>     Goals: IJC/Pigtail Removal, Blood Pressure Control  Readmission: No Quality Measure: Not applicable  VTE Prophylaxis: Chemical  Influenza Vaccine screening completed? YES  Pneumococcal Vaccine screening completed? NO  Mobility needs: No   Nutrition plan:Yes  Consults:Case Management    Financial concerns:No  Escalated to CM? YES  RRAT Score: 10   Interventions: None  Testing due for pt today? NO  LOS: 18 days Expected length of stay 20 days  [facility]   PCP: Phys Other, MD  Transportation needs: Yes    Days before discharge:two or more days before discharge   Discharge disposition: [facility]    Signed:     Mellody MemosDomonique O Shields  11/03/2015  8:04 AM

## 2015-11-03 NOTE — Op Note (Signed)
[facility]  OPERATIVE REPORTS    Name:  [name], Bandy  MR#:  [id number]  DOB:  February 01, 1981  Account #:  [id number]  Date of Adm:  10/16/2015  Date of Surgery:  11/03/2015      PREOPERATIVE DIAGNOSIS: End-stage renal disease, no longer  need of catheters.    POSTOPERATIVE DIAGNOSIS: End-stage renal disease, no longer  need of catheters.    PROCEDURES PERFORMED  1. Removal of temporary dialysis catheter, right common femoral.  2. Removal of permanent tunneled dialysis catheter, left chest.    ANESTHESIA:    CULTURES: None.    SPECIMENS: None.    DRAINS: None.    ESTIMATED BLOOD LOSS: Less than 50 mL.    INDICATIONS FOR THE PROCEDURE: The patient is a 34 year old  gentleman with end-stage renal disease, no longer needing his  catheters. The patient was given the risks and benefits of the  procedure, including but not limited to bleeding, infection, damage to  adjacent structures, MI, stroke, and death, as well as loss of access.  The patient was willing to undergo the procedure.    DESCRIPTION OF PROCEDURE: The patient was correctly identified  in the precatheterization area and taken to the cath lab in stable  condition. The patient had a preincision time out prior to incision. The  patient was prepped and draped in normal sterile fashion according to  CDC guidelines and aseptic technique.    We then were able to remove all sutures of the common femoral vein  temporary dialysis catheter, removed this, and held pressure for 5  minutes with good hemostasis. We then were able to remove the  suture material from the permanent dialysis catheter, pulled, and were  able to remove the catheter in its entirety with the cuff intact, and then  held pressure for 6 minutes with good hemostasis. Then 4x4 and  Tegaderm were placed at both wounds, and the patient was taken  back to his room.        Margaretmary DysMARC Jakyle Petrucelli, MD    EC / PIM  D:  11/03/2015   14:25  T:  11/04/2015   03:24  Job #:  [id number]

## 2015-11-03 NOTE — Discharge Summary (Signed)
[facility]  DISCHARGE SUMMARY    Name:  Tyler Ballard, Tyler Ballard  MR#:  [id number]  DOB:  [birth year]  Account #:  [id number]  Date of Adm:  10/16/2015  Date of Discharge:  11/03/2015      DISPOSITION: Discharge back to the [facility].    DISCHARGE CONDITION: Stable.    DISCHARGE DIAGNOSES  1. Brief cardiac arrest due to hyperkalemia, status post resuscitation.  2. Hyperkalemia secondary to missed hemodialysis, resolved now.  3. Acute respiratory failure requiring intubation, status post extubation  on room air.  4. Nonfunctioning hemodialysis access and multiple hemodialysis  catheter break and bleeding, requiring blood transfusion.  5. Acute blood loss anemia due to bleeding from broken hemodialysis  catheter, status post 5 units of blood transfusion in the hospital.  6. Right upper extremity arteriovenous fistula.  7. End-stage renal disease, on hemodialysis.  8. 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  9. Pancytopenia and thrombocytopenia secondary to human  immunodeficiency virus.  10. Hypertension.  11. Noncompliance.  12. The patient has very manipulative behavior for secondary gain to  stay in the hospital.    DISCHARGE MEDICATIONS  1. Amlodipine 10 mg daily.  2. Trazodone 50 mg at bedtime p.r.n.  3. Bactrim DS 1 tablet 3 times a week on dialysis day.  4. Clonidine 1 tablet 3 times weekly after dialysis.  5. PhosLo 3 times daily.  6. Clonidine 0.1 mg b.i.d.  7. Epogen with hemodialysis.  8. Pepcid 20 mg daily.  9. Folic acid 1 mg daily.  10. Hydroxyurea 500 mg daily.  11. Lisinopril 10 mg daily.    CONSULTATIONS IN THE HOSPITAL  1. Consultation with Dr. Ronne Binning, Vascular Surgery.  2. Consultation with Dr. Anselm Jungling, Interventional Radiology.  3. Consultation with Dr. Larry Sierras, Interventional Radiology.  4. Consultation Dr. Zollie Pee, Pulmonary Critical Care.   5. Consultation with Dr. Loreli Dollar, Nephrology.  6. Consultation with Dr. Jarold Song, Psychiatry.    HOSPITAL COURSE: This is a 34 year old, African American male  who comes to the emergency room, sent from the [facility] for missed hemodialysis and electrolyte imbalance. The patient did  not have hemodialysis for almost 2 weeks and he started having some  jerking movements. He is noted to have elevated BNP and also was  noted to be in fluid overload, sent to the ED for further evaluation. The  patient was admitted to the hospital and Vascular Surgery was  consulted for catheter placement, and dialysis was planned in the  morning. The patient had a temporary catheter placed by Vascular  Surgery and, after that, the patient underwent dialysis. Initially,  Vascular attempted multiple attempts to do the dialysis catheter, but it  could not be done, so the patient's catheter was held on the initial day,  but the next day the patient was taken to the OR and successful  dialysis catheter was placed, and the patient underwent dialysis.  During the hospital course, the patient multiple times told the  nephrologist and also the hospital that he has pancreatic cancer, but  on further questioning, it was noted that the patient meant pancreatitis.  Concerning for that, the patient also had his CA19-9 checked, which  was within normal limits. He also complained that he had sickle cell  disease, but a hemoglobin electrophoresis was done, which confirmed  that he does not have any sickle cell disease. The patient has been  very manipulative  in the hospital. We feel that he probably broke  multiple dialysis catheters. He had 3 dialysis catheters put in the  hospital; all of them started bleeding in the hospital. He had 1 catheter  prior to coming, which also bled in the hospital. The patient had 3 rapid  responses in the hospital because of bleeding from the dialysis   catheter and he also required blood transfusion. The patient was noted  to have very manipulative behavior and multiple staff, including  physicians, have noted that the patient manipulates to stay in the  hospital. Vascular Surgery was very clear that all his catheters were  broken by the patient, as they do not think that the catheters came  broken, as he was placed 3 catheters in the hospital. During the fourth  catheter placement in the right groin, the patient had hyperkalemia at  that time and he could not get dialyzed. He could not have an IV  access either because of very poor access from his multiple clotted  veins. Because of hyperkalemia, he had a brief cardiac arrest for 15-20  seconds. He was resuscitated. He was intubated for the surgery. He  was intubated in the OR. Post brief episode, he had emergent dialysis  and his potassium normalized. He was on the ventilator for almost 24 hours, after which  he was weaned off successfully. The patient did not mention about he  has an AV fistula in his right upper arm, but it was noted that he has a  fistula and Vascular Surgery evaluated the fistula with an ultrasound  and cleared that it is functioning and can be used. The patient had  multiple dialyses with the fistula and it functioned well. A temporary  dialysis catheter in the groin and also dialysis catheter placed in the left  chest was taken out by Vascular Surgery. He was again tested by his  nephrologist with AV fistula, which functioned very well, and that  nephrologist cleared the patient for discharge from their standpoint.  The patient is very manipulative and nephrologist did not want to tell  the patient about his discharge back to jail, as he has multiple attempts  at throwing up or bleeding from the catheter and avoiding to be  transferred back.    The patient has also known HIV with CD4 count about 140. Infectious  Disease saw the patient and put him on Bactrim for PCP prophylaxis,   but his Bactrim was held as he could not get dialysis consistently as  he became hyperkalemic. Today, I discussed with nephrologist about  the patient being on Bactrim and lisinopril. Dr. Ardelle ParkHaque states it is okay,  as he is going to be on dialysis and they can monitor the potassium  closely. The patient is currently hemodynamically and medically stable  for discharge. The [facility]    The patient is alert, awake, oriented x3. I discussed with the patient  and also with the guard at the bedside about his transfer back to jail.  Both of them completely understood and agreed with the plan. I also  answered all his questions and concerns appropriately.    For discharge instructions, followup appointments and physical exam,  please refer to the electronic medical records.        Eugenia McalpineBASAVALING TALUR-MATADHA, MD    BT / LE  D:  11/03/2015   16:12  T:  11/04/2015   12:46  Job #:  [id number]    [facility]    EDMS HIV Clinic

## 2015-11-03 NOTE — Progress Notes (Addendum)
Pt brought from room 366 inpatient to cardiac cath holding area, Dr. Lorine Bearsamacho at bedside to [facility] from [facility]ts right groin and left chest, pt tolerated procedure well, transparent dressing applied to both sites . Pt will be taken back to room 366.

## 2015-11-03 NOTE — Other (Signed)
ACUTE HEMODIALYSIS FLOW SHEET    HEMODIALYSIS ORDERS: Physician: Inda Merlin     Dialyzer: Revaclear         Duration: 2 hr  BFR: 400   DFR: 800   Dialysate:  Temp 36.0 K+   2    Ca+  3 Na 140 Bicarb 35   Weight:  105.6 kg    Bed Scale      Unable to Obtain       Dry weight/UF Goal: 2000 Access RUE AVF  Needle Gauge 15    Heparin   Bolus      Units     Hourly       Units    None     Catheter locking solution    Pre BP:   173/92    Pulse:     89     Temperature:   99.4  Respirations: 18  Tx: NS       ml/Bolus  Other         N/A   Labs: Pre        Post:         N/A   Additional Orders(medications, blood products, hypotension management):        N/A      Time Out/Safety Check   [facility] Consent Verified     CATHETER ACCESS: N/A   Right   Left   IJ     Fem    First use X-ray verified     Tunnel                 Non Tunneled   No S/S infection  Redness  Drainage Cultured Swelling Pain   Medical Aseptic Prep Utilized   Dressing Changed   Biopatch  Date:       Clotted   Patent   Flows: Good  Poor  Reversed   If access problem, Dr. notified: Yes    N/A  Date:           GRAFT/FISTULA ACCESS:  N/A     Right     Left     UE     LE   AVG   AVF        Buttonhole    Medical Aseptic Prep Utilized   No S/S infection  Redness  Drainage Cultured Swelling Pain    Bruit:    Strong     Weak       Thrill :    Strong     Weak       Needle Gauge: 15   Length: 1   If access problem, Dr. notified: Yes     N/A  Date:        Please describe access if present and not used:       GENERAL ASSESSMENT:    LUNGS:  Rate 18 SaO2%       N/A     Clear   Coarse   Crackles   Wheezing         Diminished     Location : RLL   LLL    RUL  LUL   Cough: Productive  Dry  N/A   Respirations:  Easy  Labored   Therapy:  RA  NC 2 l/min    Mask: NRB Venti       O2%                  Ventilator  Intubated   [employer]  BiPaP   CARDIAC: Regular       Irregular    Pericardial Rub   JVD          Monitored   Bedside   Remotely monitored  N/A  Rhythm:     EDEMA:  None  Generalized   Pitting  1     2     3     4                   Facial   Pedal    UE   LE   SKIN:    Warm   Hot      Cold    Dry      Pale    Diaphoretic                   Flushed   Jaundiced   Cyanotic   Rash   Weeping   LOC:     Alert      Oriented:     Person      Place  Time                Confused   Lethargic   Medicated   Non-responsive     GI / ABDOMEN:   Flat     Distended     Soft     Firm     Obese                              Diarrhea   Bowel Sounds   Nausea   Vomiting      GU / URINE ASSESSMENT: Voiding    Oliguria   Anuria     Foley      Incontinent      Incontinent Brief        Bathroom Privileges     PAIN:  0 1  2   3   4   5   6   7   8   9   10             Scale 0-10  Action/Follow Up:    MOBILITY:   Amb     Amb/Assist     Bed     Wheelchair   Stretcher      All Vitals and Treatment Details on Attached Flowsheet       Hospital: [facility]          Room # 366      1st Time Acute   Stat   Routine   Urgent      Acute Room    Bedside   ICU/CCU   ER   Isolation Precautions:   Dialysis    Airborne    Contact     Reverse   Special Considerations:          Blood Consent Verified N/A     ALLERGIES:    NKA          Code Status:   Full Code   DNR   Other           HBsAg ONLY: Date Drawn 10/17/15         Negative Positive Unknown   HBsAb: Date 10/17/15     Susceptible    Immune10 Not Drawn   Drawn     Current Labs:    Date of Labs: 11/02/15  Today      Results for Tyler Ballard, Tyler Ballard (MRN [id number]) as of 11/03/2015 15:05   Ref. Range 11/02/2015 08:35   Sodium Latest Ref Range: 136 - 145 mmol/L 141   Potassium Latest Ref Range: 3.5 - 5.5 mmol/L 4.0   Chloride Latest Ref Range: 100 - 108 mmol/L 106   CO2 Latest Ref Range: 21 - 32 mmol/L 25   Anion gap Latest Ref Range: 3.0 - 18 mmol/L 10   Glucose Latest Ref Range: 74 - 99 mg/dL 78   BUN Latest Ref Range: 7.0 - 18 MG/DL 38 (H)   Creatinine Latest Ref Range: 0.6 - 1.3 MG/DL 1.97 (H)   BUN/Creatinine ratio Latest Ref Range: 12 - 20   6 (L)    Calcium Latest Ref Range: 8.5 - 10.1 MG/DL 7.6 (L)   Phosphorus Latest Ref Range: 2.5 - 4.9 MG/DL 3.8   Magnesium Latest Ref Range: 1.8 - 2.4 mg/dL 2.2   GFR est non-AA Latest Ref Range: >60 ml/min/1.37m2 9 (L)   GFR est AA Latest Ref Range: >60 ml/min/1.50m2 11 (L)   Bilirubin, total Latest Ref Range: 0.2 - 1.0 MG/DL 0.5   Protein, total Latest Ref Range: 6.4 - 8.2 g/dL 7.9   Albumin Latest Ref Range: 3.4 - 5.0 g/dL 2.1 (L)   Globulin Latest Ref Range: 2.0 - 4.0 g/dL 5.8 (H)   A-G Ratio Latest Ref Range: 0.8 - 1.7   0.4 (L)   ALT Latest Ref Range: 16 - 61 U/L 6 (L)   AST Latest Ref Range: 15 - 37 U/L 21   Alk. phosphatase Latest Ref Range: 45 - 117 U/L 127 (H)      Results for Tyler Ballard, Tyler Ballard (MRN [id number]) as of 11/03/2015 15:05   Ref. Range 11/02/2015 08:35   WBC Latest Ref Range: 4.6 - 13.2 K/uL 2.5 (L)   RBC Latest Ref Range: 4.70 - 5.50 M/uL 3.12 (L)   HGB Latest Ref Range: 13.0 - 16.0 g/dL 9.1 (L)   HCT Latest Ref Range: 36.0 - 48.0 % 29.5 (L)   MCV Latest Ref Range: 74.0 - 97.0 FL 94.6   MCH Latest Ref Range: 24.0 - 34.0 PG 29.2   MCHC Latest Ref Range: 31.0 - 37.0 g/dL 26.4 (L)   RDW Latest Ref Range: 11.6 - 14.5 % 18.5 (H)   PLATELET Latest Ref Range: 135 - 420 K/uL 88 (L)   MPV Latest Ref Range: 9.2 - 11.8 FL 10.2   NEUTROPHILS Latest Ref Range: 40 - 73 % 52   LYMPHOCYTES Latest Ref Range: 21 - 52 % 30   MONOCYTES Latest Ref Range: 3 - 10 % 13 (H)   EOSINOPHILS Latest Ref Range: 0 - 5 % 5   BASOPHILS Latest Ref Range: 0 - 2 % 0   DF Latest Units:   AUTOMATED   ABS. NEUTROPHILS Latest Ref Range: 1.8 - 8.0 K/UL 1.3 (L)   ABS. LYMPHOCYTES Latest Ref Range: 0.9 - 3.6 K/UL 0.7 (L)   ABS. MONOCYTES Latest Ref Range: 0.05 - 1.2 K/UL 0.3   ABS. EOSINOPHILS Latest Ref Range: 0.0 - 0.4 K/UL 0.1   ABS. BASOPHILS Latest Ref Range: 0.0 - 0.06 K/UL 0.0   and paste current labs here.  DIET:   Renal     Other      NPO       Diabetic      PRIMARY NURSE REPORT: First initial/Last name/Title      Pre Dialysis: Vassie Moselle RN    Time: 1115      EDUCATION:     Patient  Other         Knowledge Basis: None Minimal  Substantial   Barriers to learning  N/A    Access Care      S&S of infection      Fluid Management     K+     Procedural    Albumin      Medications      Tx Options      Transplant      Diet      Other   Teaching Tools:   Explain   Demo   Handouts  Video  Patient response:    Verbalized understanding   Teach back   Return demonstration  Requires follow up   Inappropriate due to            [place] ??? Before each treatment:     Machine Number:                   [facility]                                   Unit Machine # 7 with centralized RO                                   Portable Machine #1/RO serial # [id number]                                   Portable Machine #2/RO serial # [id number]                                   Portable Machine #3/RO serial # [id number]                                                                                                       [place] #11/RO serial # [id number]                                    Portable Machine #12/RO serial # [id number]  Portable Machine #13/RO serial #  [id number]      Alarm Test:  Pass time yes         Other:          RO/Machine Log Complete      Temp    36.9            Extracorporeal Circuit Tested for integrity   Dialysate: pH  7.4 Conductivity: Meter   14.0     HD Machine   13.9                  TCD: 13.9  Dialyzer Lot # [id number]            Blood Tubing Lot # [id number]          Saline Lot #  [id number]     CHLORINE TESTING-Before each treatment and every 4 hours    Total Chlorine:  less than 0.1 ppm  Time: 1150 4 Hr/2nd Check Time:    (if greater than 0.1 ppm from Primary then every 30 minutes from  Secondary)     TREATMENT INITIATION ??? with Dialysis Precautions:yes    All Connections Secured                  Saline Line Double Clamped    Venous Parameters Set                   Arterial Parameters Set     Prime Given  250 ml               Air Foam Detector Engaged      Treatment Initiation Note: Assumed care of pt at 1140, A&OX4. Vital signs 99.04-05-88-173/92-100%. Complete body assessment done. Tx started at 1200 via RUE AVF. A size 15 needle used to access AVF. Pt tolerating tx without distress.      Medication Dose Volume Route Initials Dialyzer Cleared:  Good  Fair   Poor    Blood processed:  2000 L  UF Removed  45.1 Ml    Post Wt: 101.7    kg  POst BP:   160/79       Pulse: 90      Respirations: 18  Temperature: 99.4                                   Post Tx Vascular Access: AVF/AVG: Bleeding stopped Art 5 min. Ven. 5 Min   N/A                                   Catheter: Locking solution: Heparin 1:1000 Art.   Ven.   N/A                                 Post Assessment:                                    Skin:   Warm   Dry  Diaphoretic     Flushed   Pale  Cyanotic   [facility] Signatures Title Initials  Time Lungs:  Clear     Course   Crackles   Wheezing  Diminished       Cardiac:  Regular    Irregular    Monitor   N/A  Rhythm:           Edema:   None     General      Facial    Pedal     UE     LE       Pai 0  $'1  2   3  4   5   6   7   8   9   10         'C$ Post Treatment Note: Tx ended at 1407 without distress. Vital signs stable 99.0-18-90-160/79-100%. Pre wt 105.6 kg. Post wt 101.7 kg. Fluid removed was 2060ml. Pt received 12.5 mg of Phenergan during tx for c/o n/v. Phenergan effect. Report called to [employer] RN and pt returned to room 366.      POST TREATMENT PRIMARY NURSE HANDOFF REPORT:     First initial/Last name/Title         Post Dialysis: Vassie Moselle RN Time:  8182     Abbreviations: AVG-arterial venous graft, AVF-arterial venous fistula,  IJ-Internal Jugular, Subcl-Subclavian, Fem-Femoral, Tx-treatment, AP/HR-apical heart rate, DFR-dialysate flow rate, BFR-blood flow rate, AP-arterial pressure, VP-venous pressure, UF-ultrafiltrate, TMP-transmembrane pressure, Ven-Venous, Art-Arterial, RO-Reverse Osmosis

## 2015-11-03 NOTE — ED Notes (Signed)
I have reviewed discharge instructions with the patient.  The patient verbalized understanding.  Patient armband removed and shredded.  [facility] with no acute distress noted at this time, the patient is alert, oriented and stable at time of discharge. Vital Signs stable.  Patient is being discharged without prescription at this time.

## 2015-11-03 NOTE — Progress Notes (Signed)
Patient has dc order. [facility].   [name] attending MD included in DC summary the need for appointment arrangement with [facility]. Tyler Lis McFadden, rn,c m

## 2015-11-03 NOTE — Other (Signed)
Report given to Serenia, RN. Pt resting quietly, denies any needs, callbell within reach.

## 2015-11-03 NOTE — Op Note (Signed)
[facility]  OPERATIVE REPORTS    Name:  Tyler Ballard, Tyler Ballard  MR#:  [id number]  DOB:  05/12/1981  Account #:  [id number]  Date of Adm:  10/16/2015  Date of Surgery:  11/03/2015      PREOPERATIVE DIAGNOSIS: End-stage renal disease, no longer  need of catheters.    POSTOPERATIVE DIAGNOSIS: End-stage renal disease, no longer  need of catheters.    PROCEDURES PERFORMED  1. Removal of temporary dialysis catheter, right common femoral.  2. Removal of permanent tunneled dialysis catheter, left chest.    ANESTHESIA:    CULTURES: None.    SPECIMENS: None.    DRAINS: None.    ESTIMATED BLOOD LOSS: Less than 50 mL.    INDICATIONS FOR THE PROCEDURE: The patient is a 33-year-old  gentleman with end-stage renal disease, no longer needing his  catheters. The patient was given the risks and benefits of the  procedure, including but not limited to bleeding, infection, damage to  adjacent structures, MI, stroke, and death, as well as loss of access.  The patient was willing to undergo the procedure.    DESCRIPTION OF PROCEDURE: The patient was correctly identified  in the precatheterization area and taken to the cath lab in stable  condition. The patient had a preincision time out prior to incision. The  patient was prepped and draped in normal sterile fashion according to  CDC guidelines and aseptic technique.    We then were able to remove all sutures of the common femoral vein  temporary dialysis catheter, removed this, and held pressure for 5  minutes with good hemostasis. We then were able to remove the  suture material from the permanent dialysis catheter, pulled, and were  able to remove the catheter in its entirety with the cuff intact, and then  held pressure for 6 minutes with good hemostasis. Then 4x4 and  Tegaderm were placed at both wounds, and the patient was taken  back to his room.        Davit Vassar, MD    EC / PIM  D:  11/03/2015   14:25  T:  11/04/2015   03:24  Job #:  [id number]

## 2015-11-03 NOTE — Other (Signed)
TRANSFER - OUT REPORT:    Verbal report given to Fayrene FearingJames (name) on Cal[employer] CorporationElbert C Kulesza  being transferred to Dialysis (unit) for ordered procedure       Report consisted of patient???s Situation, Background, Assessment and   Recommendations(SBAR).     Information from the following report(s) SBAR, Kardex and MAR was reviewed with the receiving nurse.    Lines:   Double Lumen Hickman 09/04/13 Left Other(comment) (Active)   Central Line Being Utilized No 11/02/2015  8:21 AM   Criteria for Appropriate Use Dialysis/apheresis 11/02/2015  8:21 AM   Site Assessment Clean, dry, & intact 11/02/2015  8:21 AM   Infiltration Assessment 0 11/02/2015  8:21 AM   Affected Extremity/Extremities Color distal to insertion site pink (or appropriate for race);Pulses palpable;Range of motion performed 11/02/2015  8:21 AM   Date of Last Dressing Change 10/27/15 10/31/2015  8:29 AM   Dressing Status Clean, dry, & intact 11/02/2015  8:21 AM   Dressing Type 4 X 4;Transparent 11/02/2015  8:21 AM   Proximal Hub Color/Line Status Blue 10/31/2015  8:29 AM   Positive Blood Return (Medial Site) Yes 10/31/2015  8:29 AM   Distal Hub Color/Line Status Red 10/31/2015  8:29 AM   Positive Blood Return (Lateral Site) Yes 10/31/2015  8:29 AM   Alcohol Cap Used No 10/27/2015  6:00 PM       Peripheral IV 10/27/15 Right Other(comment) (Active)   Site Assessment Clean, dry, & intact 11/02/2015  8:21 AM   Phlebitis Assessment 0 11/02/2015  8:21 AM   Infiltration Assessment 0 11/02/2015  8:21 AM   Dressing Status Clean, dry, & intact 11/02/2015  8:21 AM   Dressing Type Transparent 11/02/2015  8:21 AM   Hub Color/Line Status Blue;Infusing 10/31/2015  8:29 AM   Action Taken Other (comment) 10/27/2015  8:00 PM   Alcohol Cap Used No 10/27/2015  8:00 PM        Opportunity for questions and clarification was provided.      Patient transported with:   O2 @ 2 liters  Tech

## 2015-11-03 NOTE — ED Notes (Signed)
[facility], patient is alert, oriented and stable at [facility] Receiving nurse aware that patient has swelling to left breast and left arm, Dr. Tami LinMackie is also aware.  Bleeding is controlled at this time and wound dressed with non-adherent border dressing and pressure bandage.  Attempted to call Dwain SarnaLinda Bryant,[facility]istant [facility] [phone]. Voice mail left for her to return my call

## 2015-11-03 NOTE — Progress Notes (Signed)
Seen during dialysis ,AVF working good, cannulated with 15 Gauge needle,qb 400, appropriate Venous & arterial pressure, he claims it is hurting, no swelling , no tenderness around AVF, [facility].

## 2015-11-03 NOTE — Other (Addendum)
Bedside and Verbal shift change report received from Evlyn CourierS. McCullor, RN. Report included the following information SBAR, Kardex and MAR.     1745- Pt discharged and taken via wheel chair to main lobby of hospital. Hospital security, pt guard, and guard from correctional facility present at discharge.

## 2015-11-03 NOTE — Other (Signed)
0115 Received patient after report from off going nurse. Patient resting in bed. No distress noted. Call bell within reach, siderails up x 3, bed in lowest position, and patient instructed to use call bell for assistance. Guard at bedside. Will continue to monitor.    0500 BP 219/93. Hydralazine 10 mg given prn as ordered.     0607 BP 170/83. PRN Clonidine 0.1 mg given prn as ordered.    0650 BP 130/74.    0700 0700 Bedside and Verbal shift change report given to [employer] CorpDominique RN (oncoming nurse) by Zeb ComfortSerenia S McMullor, RN (offgoing nurse). Report included the following information Kardex, Intake/Output, MAR and Recent Results.

## 2015-11-03 NOTE — Progress Notes (Signed)
AVF is patent with excellent Thrill & Bruit, both (L) IJ catheter &  Right Groin cathetesr are taken out, will dialyze for 2 hours today & if AVF does not have any problem  [facility] discussed with Dr.Talur.

## 2015-11-04 LAB — CBC WITH AUTOMATED DIFF
ABS. BASOPHILS: 0 10*3/uL (ref 0.0–0.1)
ABS. EOSINOPHILS: 0.1 10*3/uL (ref 0.0–0.4)
ABS. LYMPHOCYTES: 0.6 10*3/uL — ABNORMAL LOW (ref 0.9–3.6)
ABS. MONOCYTES: 0.3 10*3/uL (ref 0.05–1.2)
ABS. NEUTROPHILS: 1.8 10*3/uL (ref 1.8–8.0)
BASOPHILS: 0 % (ref 0–2)
EOSINOPHILS: 4 % (ref 0–5)
HCT: 28 % — ABNORMAL LOW (ref 36.0–48.0)
HGB: 8.7 g/dL — ABNORMAL LOW (ref 13.0–16.0)
LYMPHOCYTES: 21 % (ref 21–52)
MCH: 29.2 PG (ref 24.0–34.0)
MCHC: 31.1 g/dL (ref 31.0–37.0)
MCV: 94 FL (ref 74.0–97.0)
MONOCYTES: 11 % — ABNORMAL HIGH (ref 3–10)
MPV: 10.5 FL (ref 9.2–11.8)
NEUTROPHILS: 64 % (ref 40–73)
PLATELET: 88 10*3/uL — ABNORMAL LOW (ref 135–420)
RBC: 2.98 M/uL — ABNORMAL LOW (ref 4.70–5.50)
RDW: 18.5 % — ABNORMAL HIGH (ref 11.6–14.5)
WBC: 2.8 10*3/uL — ABNORMAL LOW (ref 4.6–13.2)

## 2015-11-04 LAB — EKG, 12 LEAD, INITIAL
Atrial Rate: 104 {beats}/min
Calculated P Axis: 58 degrees
Calculated R Axis: 48 degrees
Calculated T Axis: 13 degrees
P-R Interval: 224 ms
Q-T Interval: 358 ms
QRS Duration: 68 ms
QTC Calculation (Bezet): 470 ms
Ventricular Rate: 104 {beats}/min

## 2015-11-04 LAB — METABOLIC PANEL, COMPREHENSIVE
A-G Ratio: 0.4 — ABNORMAL LOW (ref 0.8–1.7)
ALT (SGPT): <6 U/L — ABNORMAL LOW (ref 16–61)
AST (SGOT): 31 U/L (ref 15–37)
Albumin: 2.3 g/dL — ABNORMAL LOW (ref 3.4–5.0)
Alk. phosphatase: 126 U/L — ABNORMAL HIGH (ref 45–117)
Anion gap: 8 mmol/L (ref 3.0–18)
BUN/Creatinine ratio: 5 — ABNORMAL LOW (ref 12–20)
BUN: 26 MG/DL — ABNORMAL HIGH (ref 7.0–18)
Bilirubin, total: 0.4 MG/DL (ref 0.2–1.0)
CO2: 29 mmol/L (ref 21–32)
Calcium: 8 MG/DL — ABNORMAL LOW (ref 8.5–10.1)
Chloride: 103 mmol/L (ref 100–108)
Creatinine: 4.87 MG/DL — ABNORMAL HIGH (ref 0.6–1.3)
GFR est AA: 17 mL/min/{1.73_m2} — ABNORMAL LOW (ref >60)
GFR est non-AA: 14 mL/min/{1.73_m2} — ABNORMAL LOW (ref >60)
Globulin: 5.8 g/dL — ABNORMAL HIGH (ref 2.0–4.0)
Glucose: 178 mg/dL — ABNORMAL HIGH (ref 74–99)
Potassium: 4.2 mmol/L (ref 3.5–5.5)
Protein, total: 8.1 g/dL (ref 6.4–8.2)
Sodium: 140 mmol/L (ref 136–145)

## 2015-11-04 LAB — HGB & HCT
HCT: 27.7 % — ABNORMAL LOW (ref 36.0–48.0)
HGB: 8.7 g/dL — ABNORMAL LOW (ref 13.0–16.0)

## 2015-11-04 MED ORDER — HYDROMORPHONE (PF) 2 MG/ML IJ SOLN
2 mg/mL | Freq: Once | INTRAMUSCULAR | Status: AC
Start: 2015-11-04 — End: 2015-11-03
  Administered 2015-11-04: 01:00:00 via INTRAVENOUS

## 2015-11-04 MED ORDER — ONDANSETRON 8 MG TAB, RAPID DISSOLVE
8 mg | ORAL | Status: AC
Start: 2015-11-04 — End: 2015-11-03
  Administered 2015-11-04: 04:00:00 via ORAL

## 2015-11-04 MED ORDER — HYDROMORPHONE (PF) 2 MG/ML IJ SOLN
2 mg/mL | Freq: Once | INTRAMUSCULAR | Status: AC
Start: 2015-11-04 — End: 2015-11-03
  Administered 2015-11-04: 04:00:00 via INTRAVENOUS

## 2015-11-04 MED ORDER — DIPHENHYDRAMINE 25 MG CAP
25 mg | ORAL | Status: AC
Start: 2015-11-04 — End: 2015-11-03
  Administered 2015-11-04: 02:00:00 via ORAL

## 2015-11-04 MED ORDER — HYDROMORPHONE (PF) 2 MG/ML IJ SOLN
2 mg/mL | Freq: Once | INTRAMUSCULAR | Status: AC
Start: 2015-11-04 — End: 2015-11-03
  Administered 2015-11-04: 02:00:00 via INTRAVENOUS

## 2015-11-04 MED ORDER — PROMETHAZINE 25 MG/ML INJECTION
25 mg/mL | Freq: Once | INTRAMUSCULAR | Status: AC
Start: 2015-11-04 — End: 2015-11-03
  Administered 2015-11-04: 01:00:00 via INTRAMUSCULAR

## 2015-11-04 MED FILL — DILAUDID (PF) 2 MG/ML INJECTION SOLUTION: 2 mg/mL | INTRAMUSCULAR | Qty: 1

## 2015-11-04 MED FILL — DIPHENHYDRAMINE 25 MG CAP: 25 mg | ORAL | Qty: 1

## 2015-11-04 MED FILL — PROMETHAZINE 25 MG/ML INJECTION: 25 mg/mL | INTRAMUSCULAR | Qty: 1

## 2015-11-04 MED FILL — ONDANSETRON 8 MG TAB, RAPID DISSOLVE: 8 mg | ORAL | Qty: 1

## 2016-01-13 ENCOUNTER — Emergency Department (HOSPITAL_COMMUNITY)
Admission: EM | Admit: 2016-01-13 | Discharge: 2016-01-14 | Disposition: A | Discharge status: Home / Self Care | Payer: Medicare Other | Attending: Emergency Medicine | Admitting: Emergency Medicine

## 2016-01-13 ENCOUNTER — Encounter (HOSPITAL_COMMUNITY): Payer: Self-pay | Admitting: *Deleted

## 2016-01-13 ENCOUNTER — Emergency Department (HOSPITAL_COMMUNITY): Payer: Medicare Other

## 2016-01-13 DIAGNOSIS — F17210 Nicotine dependence, cigarettes, uncomplicated: Secondary | ICD-10-CM | POA: Insufficient documentation

## 2016-01-13 DIAGNOSIS — N186 End stage renal disease: Secondary | ICD-10-CM | POA: Diagnosis not present

## 2016-01-13 DIAGNOSIS — R112 Nausea with vomiting, unspecified: Secondary | ICD-10-CM | POA: Diagnosis not present

## 2016-01-13 DIAGNOSIS — Z8719 Personal history of other diseases of the digestive system: Secondary | ICD-10-CM | POA: Insufficient documentation

## 2016-01-13 DIAGNOSIS — M2550 Pain in unspecified joint: Secondary | ICD-10-CM | POA: Diagnosis present

## 2016-01-13 DIAGNOSIS — R109 Unspecified abdominal pain: Secondary | ICD-10-CM | POA: Diagnosis not present

## 2016-01-13 DIAGNOSIS — I120 Hypertensive chronic kidney disease with stage 5 chronic kidney disease or end stage renal disease: Secondary | ICD-10-CM | POA: Insufficient documentation

## 2016-01-13 DIAGNOSIS — Z992 Dependence on renal dialysis: Secondary | ICD-10-CM | POA: Diagnosis not present

## 2016-01-13 DIAGNOSIS — B20 Human immunodeficiency virus [HIV] disease: Secondary | ICD-10-CM | POA: Diagnosis not present

## 2016-01-13 DIAGNOSIS — D5700 Hb-SS disease with crisis, unspecified: Secondary | ICD-10-CM | POA: Diagnosis not present

## 2016-01-13 DIAGNOSIS — Z79899 Other long term (current) drug therapy: Secondary | ICD-10-CM | POA: Diagnosis not present

## 2016-01-13 LAB — CBC WITH DIFFERENTIAL/PLATELET
BASOS PCT: 0 %
Basophils Absolute: 0 10*3/uL (ref 0.0–0.1)
Eosinophils Absolute: 0.1 10*3/uL (ref 0.0–0.7)
Eosinophils Relative: 3 %
HEMATOCRIT: 29.5 % — AB (ref 39.0–52.0)
Hemoglobin: 9 g/dL — ABNORMAL LOW (ref 13.0–17.0)
LYMPHS ABS: 0.8 10*3/uL (ref 0.7–4.0)
LYMPHS PCT: 31 %
MCH: 28.9 pg (ref 26.0–34.0)
MCHC: 30.5 g/dL (ref 30.0–36.0)
MCV: 94.9 fL (ref 78.0–100.0)
MONO ABS: 0.4 10*3/uL (ref 0.1–1.0)
MONOS PCT: 14 %
NEUTROS ABS: 1.3 10*3/uL — AB (ref 1.7–7.7)
Neutrophils Relative %: 52 %
Platelets: 115 10*3/uL — ABNORMAL LOW (ref 150–400)
RBC: 3.11 MIL/uL — ABNORMAL LOW (ref 4.22–5.81)
RDW: 20.1 % — AB (ref 11.5–15.5)
WBC: 2.6 10*3/uL — ABNORMAL LOW (ref 4.0–10.5)

## 2016-01-13 LAB — RETICULOCYTES
RBC.: 3.07 MIL/uL — ABNORMAL LOW (ref 4.22–5.81)
Retic Count, Absolute: 92.1 10*3/uL (ref 19.0–186.0)
Retic Ct Pct: 3 % (ref 0.4–3.1)

## 2016-01-13 LAB — COMPREHENSIVE METABOLIC PANEL
ALBUMIN: 2.1 g/dL — AB (ref 3.5–5.0)
ALK PHOS: 120 U/L (ref 38–126)
ALT: 8 U/L — ABNORMAL LOW (ref 17–63)
ANION GAP: 12 (ref 5–15)
AST: 24 U/L (ref 15–41)
BUN: 29 mg/dL — ABNORMAL HIGH (ref 6–20)
CALCIUM: 7.7 mg/dL — AB (ref 8.9–10.3)
CO2: 22 mmol/L (ref 22–32)
Chloride: 106 mmol/L (ref 101–111)
Creatinine, Ser: 7.18 mg/dL — ABNORMAL HIGH (ref 0.61–1.24)
GFR calc Af Amer: 10 mL/min — ABNORMAL LOW (ref >60)
GFR calc non Af Amer: 9 mL/min — ABNORMAL LOW (ref >60)
GLUCOSE: 95 mg/dL (ref 65–99)
Potassium: 4.1 mmol/L (ref 3.5–5.1)
SODIUM: 140 mmol/L (ref 135–145)
Total Bilirubin: 0.5 mg/dL (ref 0.3–1.2)
Total Protein: 7 g/dL (ref 6.5–8.1)

## 2016-01-13 LAB — LIPASE, BLOOD: Lipase: 48 U/L (ref 11–51)

## 2016-01-13 MED ORDER — HEPARIN SOD (PORK) LOCK FLUSH 100 UNIT/ML IV SOLN
500.0000 [IU] | Freq: Once | INTRAVENOUS | Status: AC
  Administered 2016-01-14: 500 [IU]
  Filled 2016-01-13 (×2): qty 5

## 2016-01-13 MED ORDER — ONDANSETRON HCL 4 MG/2ML IJ SOLN: 4.0000 mg | Freq: Once | INTRAMUSCULAR | Status: DC

## 2016-01-13 MED ORDER — ONDANSETRON HCL 4 MG/2ML IJ SOLN
4.0000 mg | INTRAMUSCULAR | Status: DC | PRN
Start: 2016-01-13 — End: 2016-01-14
  Administered 2016-01-13: 4 mg via INTRAVENOUS
  Filled 2016-01-13 (×2): qty 2

## 2016-01-13 MED ORDER — HYDROMORPHONE HCL 1 MG/ML IJ SOLN
1.0000 mg | Freq: Once | INTRAMUSCULAR | Status: AC
  Administered 2016-01-13: 1 mg via INTRAVENOUS
  Filled 2016-01-13: qty 1

## 2016-01-13 MED ORDER — HYDROMORPHONE HCL 1 MG/ML IJ SOLN
2.0000 mg | INTRAMUSCULAR | Status: DC | PRN
Start: 2016-01-13 — End: 2016-01-13

## 2016-01-13 MED ORDER — ONDANSETRON HCL 4 MG/2ML IJ SOLN
4.0000 mg | Freq: Once | INTRAMUSCULAR | Status: AC
Start: 2016-01-13 — End: 2016-01-13
  Administered 2016-01-13: 4 mg via INTRAVENOUS

## 2016-01-13 MED ORDER — HYDROMORPHONE HCL 1 MG/ML IJ SOLN
1.0000 mg | Freq: Once | INTRAMUSCULAR | Status: AC
Start: 2016-01-14 — End: 2016-01-14
  Administered 2016-01-14: 1 mg via INTRAVENOUS
  Filled 2016-01-13: qty 1

## 2016-01-13 MED ORDER — DIPHENHYDRAMINE HCL 25 MG PO CAPS
25.0000 mg | ORAL_CAPSULE | Freq: Once | ORAL | Status: AC
  Administered 2016-01-14: 25 mg via ORAL
  Filled 2016-01-13: qty 1

## 2016-01-13 MED ORDER — HYDROMORPHONE HCL 1 MG/ML IJ SOLN
1.0000 mg | INTRAMUSCULAR | Status: DC | PRN
  Administered 2016-01-13: 1 mg via INTRAVENOUS
  Filled 2016-01-13: qty 1

## 2016-01-13 MED ORDER — DEXTROSE 5 % IV BOLUS
1000.0000 mL | Freq: Once | INTRAVENOUS | Status: AC
  Administered 2016-01-13: 1000 mL via INTRAVENOUS

## 2016-01-13 NOTE — ED Notes (Signed)
MD at bedside. 

## 2016-01-13 NOTE — ED Provider Notes (Signed)
CSN: [id number]     Arrival date & time 01/13/16  1932 History   First MD Initiated Contact with Patient 01/13/16 2131     Chief Complaint  Patient presents with  . Joint Pain     (Consider location/radiation/quality/duration/timing/severity/associated sxs/prior Treatment) Patient is a 35 y.o. male presenting with vomiting. The history is provided by the patient.  Emesis Severity:  Moderate Duration:  2 days Timing:  Intermittent Quality:  Stomach contents Progression:  Unchanged Chronicity:  New Relieved by:  Nothing Worsened by:  Nothing tried Ineffective treatments:  None tried Associated symptoms: abdominal pain (mild left upper following vomiting episodes)   Associated symptoms: no diarrhea and no fever   Risk factors comment:  ESRD, sickle cell   Past Medical History  Diagnosis Date  . Sickle cell anemia (HCC)   . ESRD on hemodialysis (HCC)   . HIV disease (HCC)   . Drug-seeking behavior   . Sickle cell anemia (HCC)     States Dx at 35 yo, s/p multiple prior transfusions, typically with pain in legs, back  . HIV (human immunodeficiency virus infection) (HCC) 2003    Followed by Harless Nakayama at [facility] [place]   . HTN (hypertension)   . Pulmonary mass     Unclear history. States PCP was following a pulmonary mass with serial imaging  . Pancreatitis 2012    Denies any alcohol use   . Hypertension   . Blood transfusion     "several"  . End stage renal disease on dialysis [facility]) HD in 2009     Followed by Dr. Buelah Manis in [place]. States due to HTN/DM.   Marland Kitchen ESRD (end stage renal disease) on dialysis [facility])     "[facility]; [place], [place]; TTS" (08/26/2014)  . Renal failure    Past Surgical History  Procedure Laterality Date  . Hip surgery    . Dialysis fistula creation Left     "arm"  . Hip fracture surgery Right     "screw placed into joint"  . Av fistula placement Left     "arm"   Family History  Problem Relation Age of Onset  . Hypertension Father    . Diabetes      Uncles and grandfather  . Cervical cancer Mother     Dec 46yo of cervical ca   Social History  Substance Use Topics  . Smoking status: Current Every Day Smoker -- 0.12 packs/day for 18 years    Types: Cigarettes  . Smokeless tobacco: Never Used  . Alcohol Use: No    Review of Systems  Gastrointestinal: Positive for vomiting and abdominal pain (mild left upper following vomiting episodes). Negative for diarrhea.  Musculoskeletal:       Diffuse joint pain typical of chronic sickle cell pain exacerbation  All other systems reviewed and are negative.     Allergies  Review of patient's allergies indicates no known allergies.  Home Medications   Prior to Admission medications   Medication Sig Start Date End Date Taking? Authorizing Provider  amLODipine (NORVASC) 5 MG tablet Take 5 mg by mouth daily.    Historical Provider, MD  b complex-vitamin c-folic acid (NEPHRO-VITE) 0.8 MG TABS tablet Take 1 tablet by mouth daily.     Historical Provider, MD  calcium acetate (PHOSLO) 667 MG capsule Take 2,001 mg by mouth 3 (three) times daily with meals.    Historical Provider, MD  diphenhydrAMINE (BENADRYL) 50 MG capsule Take 50 mg by mouth every 6 (six) hours  as needed for itching or allergies.    Historical Provider, MD  folic acid (FOLVITE) 1 MG tablet Take 1 mg by mouth daily.    Historical Provider, MD  HYDROmorphone (DILAUDID) 8 MG tablet Take 8 mg by mouth every 4 (four) hours as needed for severe pain.    Historical Provider, MD  hydroxyurea (HYDREA) 500 MG capsule Take 500 mg by mouth 2 (two) times daily. May take with food to minimize GI side effects.    Historical Provider, MD  hydroxyurea (HYDREA) 500 MG capsule Take 500 mg by mouth 2 (two) times daily. May take with food to minimize GI side effects.    Historical Provider, MD  lisinopril (PRINIVIL,ZESTRIL) 10 MG tablet Take 10 mg by mouth daily.    Historical Provider, MD   BP 188/85 mmHg  Pulse 81  Temp(Src)  98.6 F (37 C)  Resp 18  Ht  (1.702 m)  Wt 207 lb 9 oz (94.15 kg)  BMI 32.50 kg/m2  SpO2 97% Physical Exam  Constitutional: He is oriented to person, place, and time. He appears well-developed and well-nourished. No distress.  HENT:  Head: Normocephalic and atraumatic.  Eyes: Conjunctivae are normal.  Neck: Neck supple. No tracheal deviation present.  Cardiovascular: Normal rate and regular rhythm.   Pulmonary/Chest: Effort normal. No respiratory distress.  Abdominal: Soft. He exhibits no distension. There is no tenderness. There is no rebound.  Neurological: He is alert and oriented to person, place, and time.  Skin: Skin is warm and dry.  Psychiatric: He has a normal mood and affect.    ED Course  Procedures (including critical care time) Labs Review Labs Reviewed  COMPREHENSIVE METABOLIC PANEL - Abnormal; Notable for the following:    BUN 29 (*)    Creatinine, Ser 7.18 (*)    Calcium 7.7 (*)    Albumin 2.1 (*)    ALT 8 (*)    GFR calc non Af Amer 9 (*)    GFR calc Af Amer 10 (*)    All other components within normal limits  CBC WITH DIFFERENTIAL/PLATELET - Abnormal; Notable for the following:    WBC 2.6 (*)    RBC 3.11 (*)    Hemoglobin 9.0 (*)    HCT 29.5 (*)    RDW 20.1 (*)    Platelets 115 (*)    Neutro Abs 1.3 (*)    All other components within normal limits  RETICULOCYTES - Abnormal; Notable for the following:    RBC. 3.07 (*)    All other components within normal limits  LIPASE, BLOOD    Imaging Review Dg Chest 2 View  01/13/2016  CLINICAL DATA:  Sickle cell crisis. Emesis for 2 days. Lower extremity edema for 1 week EXAM: CHEST  2 VIEW COMPARISON:  September 12, 2014 FINDINGS: Central catheter tip is at the cavoatrial junction. No pneumothorax. There are stent grafts in both subclavian regions as well as in the left brachial and axillary regions. There is slight scarring in the left lower lobe. There is cardiomegaly with perihilar interstitial  prominence. The pulmonary vascularity is normal. No adenopathy. 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 There is evidence of prior infarcts in each distal clavicle with widening of each acromioclavicular joint. IMPRESSION: Cardiomegaly with slight perihilar edema. No airspace consolidation. Bony changes consistent with known sickle cell disease. Central catheter tip at cavoatrial junction.  No pneumothorax. Electronically Signed   By: Bretta Bang III M.D.   On:  01/13/2016 20:39   I have personally reviewed and evaluated these images and lab results as part of my medical decision-making.   EKG Interpretation None      MDM   Final diagnoses:  Sickle cell anemia with pain (HCC)  Non-intractable vomiting with nausea, vomiting of unspecified type    Patient presents with pain typical of sickle cell pain crisis starting in all of his joints over last 2 days since he started having some vomiting and inability to tolerate po meds. No shortness of breath, no fevers, no signs of acute chest and Pt otherwise in NAD objectively other than complaint of pain. CBC, Retic count to assess for aplastic crisis. Given IVF and pain medicine with plan to admit vs discharge based on response and results of testing.   Pt with continued pain but improved, no vomiting during ED course, no objective signs of acute crisis. ESRD on HD, no emergent indications for dialysis currently. Plan to follow up with PCP as needed, with dialysis as scheduled and return precautions discussed for worsening or new concerning symptoms.     Lyndal Pulley, MD 01/14/16 [phone]

## 2016-01-13 NOTE — Discharge Instructions (Signed)

## 2016-01-13 NOTE — ED Notes (Signed)
The pt has a porta-cath and he will not let us draw here  He wants to wait until he gets to the treatment area

## 2016-01-13 NOTE — ED Notes (Signed)
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 Also reports vomiting x 2 days.

## 2016-01-13 NOTE — ED Notes (Signed)
The pt is c/o body pain  Joint pain and swelling for 3 days  Sickle cell pt

## 2016-01-14 ENCOUNTER — Inpatient Hospital Stay
Admission: EM | Admit: 2016-01-14 | Discharge: 2016-01-22 | DRG: 682 | Discharge status: Against Medical Advice | Payer: Medicare Other | Attending: Internal Medicine | Admitting: Internal Medicine

## 2016-01-14 DIAGNOSIS — Z9119 Patient's noncompliance with other medical treatment and regimen: Secondary | ICD-10-CM | POA: Diagnosis not present

## 2016-01-14 DIAGNOSIS — T451X5A Adverse effect of antineoplastic and immunosuppressive drugs, initial encounter: Secondary | ICD-10-CM | POA: Diagnosis present

## 2016-01-14 DIAGNOSIS — F17210 Nicotine dependence, cigarettes, uncomplicated: Secondary | ICD-10-CM | POA: Diagnosis present

## 2016-01-14 DIAGNOSIS — Z9115 Patient's noncompliance with renal dialysis: Secondary | ICD-10-CM

## 2016-01-14 DIAGNOSIS — Z992 Dependence on renal dialysis: Secondary | ICD-10-CM

## 2016-01-14 DIAGNOSIS — D631 Anemia in chronic kidney disease: Secondary | ICD-10-CM | POA: Diagnosis present

## 2016-01-14 DIAGNOSIS — G894 Chronic pain syndrome: Secondary | ICD-10-CM | POA: Diagnosis present

## 2016-01-14 DIAGNOSIS — E1122 Type 2 diabetes mellitus with diabetic chronic kidney disease: Secondary | ICD-10-CM | POA: Diagnosis present

## 2016-01-14 DIAGNOSIS — D571 Sickle-cell disease without crisis: Secondary | ICD-10-CM | POA: Diagnosis not present

## 2016-01-14 DIAGNOSIS — N189 Chronic kidney disease, unspecified: Secondary | ICD-10-CM

## 2016-01-14 DIAGNOSIS — D5700 Hb-SS disease with crisis, unspecified: Secondary | ICD-10-CM | POA: Diagnosis present

## 2016-01-14 DIAGNOSIS — D61818 Other pancytopenia: Secondary | ICD-10-CM | POA: Diagnosis not present

## 2016-01-14 DIAGNOSIS — D72819 Decreased white blood cell count, unspecified: Secondary | ICD-10-CM | POA: Diagnosis present

## 2016-01-14 DIAGNOSIS — Z79899 Other long term (current) drug therapy: Secondary | ICD-10-CM | POA: Diagnosis not present

## 2016-01-14 DIAGNOSIS — N186 End stage renal disease: Secondary | ICD-10-CM | POA: Diagnosis present

## 2016-01-14 DIAGNOSIS — N2581 Secondary hyperparathyroidism of renal origin: Secondary | ICD-10-CM | POA: Diagnosis present

## 2016-01-14 DIAGNOSIS — B20 Human immunodeficiency virus [HIV] disease: Secondary | ICD-10-CM | POA: Diagnosis present

## 2016-01-14 DIAGNOSIS — I120 Hypertensive chronic kidney disease with stage 5 chronic kidney disease or end stage renal disease: Secondary | ICD-10-CM | POA: Diagnosis present

## 2016-01-14 DIAGNOSIS — R918 Other nonspecific abnormal finding of lung field: Secondary | ICD-10-CM | POA: Diagnosis present

## 2016-01-14 DIAGNOSIS — N19 Unspecified kidney failure: Secondary | ICD-10-CM | POA: Diagnosis present

## 2016-01-14 LAB — COMPREHENSIVE METABOLIC PANEL
ALBUMIN: 2.7 g/dL — AB (ref 3.5–5.0)
ALT: 7 U/L — AB (ref 17–63)
AST: 24 U/L (ref 15–41)
Alkaline Phosphatase: 113 U/L (ref 38–126)
Anion gap: 9 (ref 5–15)
BUN: 31 mg/dL — ABNORMAL HIGH (ref 6–20)
CHLORIDE: 109 mmol/L (ref 101–111)
CO2: 21 mmol/L — AB (ref 22–32)
CREATININE: 7.14 mg/dL — AB (ref 0.61–1.24)
Calcium: 7.7 mg/dL — ABNORMAL LOW (ref 8.9–10.3)
GFR calc non Af Amer: 9 mL/min — ABNORMAL LOW (ref >60)
GFR, EST AFRICAN AMERICAN: 10 mL/min — AB (ref >60)
GLUCOSE: 116 mg/dL — AB (ref 65–99)
Potassium: 4 mmol/L (ref 3.5–5.1)
SODIUM: 139 mmol/L (ref 135–145)
Total Bilirubin: 0.8 mg/dL (ref 0.3–1.2)
Total Protein: 8.3 g/dL — ABNORMAL HIGH (ref 6.5–8.1)

## 2016-01-14 LAB — CBC WITH DIFFERENTIAL/PLATELET
BASOS ABS: 0.1 10*3/uL (ref 0–0.1)
EOS ABS: 0 10*3/uL (ref 0–0.7)
HCT: 31.7 % — ABNORMAL LOW (ref 40.0–52.0)
HEMOGLOBIN: 10.1 g/dL — AB (ref 13.0–18.0)
Lymphocytes Relative: 21 %
Lymphs Abs: 0.5 10*3/uL — ABNORMAL LOW (ref 1.0–3.6)
MCH: 30 pg (ref 26.0–34.0)
MCHC: 31.8 g/dL — ABNORMAL LOW (ref 32.0–36.0)
MCV: 94.3 fL (ref 80.0–100.0)
Monocytes Absolute: 0.4 10*3/uL (ref 0.2–1.0)
Monocytes Relative: 15 %
Neutro Abs: 1.5 10*3/uL (ref 1.4–6.5)
PLATELETS: 97 10*3/uL — AB (ref 150–440)
RBC: 3.36 MIL/uL — AB (ref 4.40–5.90)
RDW: 21.4 % — ABNORMAL HIGH (ref 11.5–14.5)
WBC: 2.6 10*3/uL — AB (ref 3.8–10.6)

## 2016-01-14 LAB — RETICULOCYTES
RBC.: 3.08 MIL/uL — AB (ref 4.40–5.90)
RETIC CT PCT: 4 % — AB (ref 0.4–3.1)
Retic Count, Absolute: 123.2 10*3/uL (ref 19.0–183.0)

## 2016-01-14 LAB — LIPASE, BLOOD: Lipase: 53 U/L — ABNORMAL HIGH (ref 11–51)

## 2016-01-14 MED ORDER — HYDROMORPHONE HCL 1 MG/ML IJ SOLN
2.0000 mg | INTRAMUSCULAR | Status: DC | PRN
  Administered 2016-01-14 – 2016-01-19 (×32): 2 mg via INTRAVENOUS
  Filled 2016-01-14 (×31): qty 2

## 2016-01-14 MED ORDER — ONDANSETRON HCL 4 MG/2ML IJ SOLN
4.0000 mg | Freq: Once | INTRAMUSCULAR | Status: AC
  Administered 2016-01-14: 4 mg via INTRAVENOUS

## 2016-01-14 MED ORDER — ONDANSETRON HCL 4 MG/2ML IJ SOLN
INTRAMUSCULAR | Status: AC
  Administered 2016-01-14: 4 mg via INTRAVENOUS
  Filled 2016-01-14: qty 2

## 2016-01-14 MED ORDER — ONDANSETRON HCL 4 MG PO TABS
4.0000 mg | ORAL_TABLET | Freq: Four times a day (QID) | ORAL | Status: DC | PRN
  Filled 2016-01-14: qty 1

## 2016-01-14 MED ORDER — CALCIUM ACETATE (PHOS BINDER) 667 MG PO CAPS
2001.0000 mg | ORAL_CAPSULE | Freq: Three times a day (TID) | ORAL | Status: DC
  Administered 2016-01-15 – 2016-01-21 (×17): 2001 mg via ORAL
  Filled 2016-01-14 (×18): qty 3

## 2016-01-14 MED ORDER — DIPHENHYDRAMINE HCL 25 MG PO CAPS
50.0000 mg | ORAL_CAPSULE | Freq: Four times a day (QID) | ORAL | Status: DC | PRN
  Filled 2016-01-14 (×2): qty 2

## 2016-01-14 MED ORDER — HYDROMORPHONE HCL 2 MG PO TABS: 8.0000 mg | ORAL_TABLET | ORAL | Status: DC | PRN

## 2016-01-14 MED ORDER — ONDANSETRON HCL 4 MG/2ML IJ SOLN
4.0000 mg | Freq: Four times a day (QID) | INTRAMUSCULAR | Status: DC | PRN
  Administered 2016-01-14 – 2016-01-22 (×13): 4 mg via INTRAVENOUS
  Filled 2016-01-14 (×12): qty 2

## 2016-01-14 MED ORDER — CALCITRIOL 0.25 MCG PO CAPS
0.2500 ug | ORAL_CAPSULE | Freq: Every day | ORAL | Status: DC
  Administered 2016-01-15 – 2016-01-20 (×5): 0.25 ug via ORAL
  Filled 2016-01-14 (×6): qty 1

## 2016-01-14 MED ORDER — SEVELAMER CARBONATE 800 MG PO TABS
800.0000 mg | ORAL_TABLET | Freq: Three times a day (TID) | ORAL | Status: DC
  Administered 2016-01-15 – 2016-01-21 (×17): 800 mg via ORAL
  Filled 2016-01-14 (×20): qty 1

## 2016-01-14 MED ORDER — HYDROMORPHONE HCL 1 MG/ML IJ SOLN
2.0000 mg | Freq: Once | INTRAMUSCULAR | Status: AC
  Administered 2016-01-14: 2 mg via INTRAVENOUS
  Filled 2016-01-14: qty 2

## 2016-01-14 MED ORDER — LISINOPRIL 10 MG PO TABS
10.0000 mg | ORAL_TABLET | Freq: Every day | ORAL | Status: DC
  Administered 2016-01-15: 10 mg via ORAL
  Filled 2016-01-14: qty 1

## 2016-01-14 MED ORDER — ONDANSETRON HCL 4 MG/2ML IJ SOLN
4.0000 mg | Freq: Once | INTRAMUSCULAR | Status: AC
  Administered 2016-01-14: 4 mg via INTRAVENOUS
  Filled 2016-01-14: qty 2

## 2016-01-14 MED ORDER — DIPHENHYDRAMINE HCL 50 MG/ML IJ SOLN
25.0000 mg | Freq: Four times a day (QID) | INTRAMUSCULAR | Status: DC | PRN
  Administered 2016-01-14 – 2016-01-22 (×25): 25 mg via INTRAVENOUS
  Filled 2016-01-14 (×25): qty 1

## 2016-01-14 MED ORDER — ONDANSETRON 8 MG PO TBDP
8.0000 mg | ORAL_TABLET | Freq: Four times a day (QID) | ORAL | Status: DC | PRN
  Administered 2016-01-14: 8 mg via ORAL
  Filled 2016-01-14 (×2): qty 1

## 2016-01-14 MED ORDER — ONDANSETRON 4 MG PO TBDP: 4.0000 mg | ORAL_TABLET | Freq: Three times a day (TID) | ORAL | Status: AC | PRN

## 2016-01-14 MED ORDER — ALBUTEROL SULFATE (2.5 MG/3ML) 0.083% IN NEBU: 2.5000 mg | INHALATION_SOLUTION | RESPIRATORY_TRACT | Status: DC | PRN

## 2016-01-14 MED ORDER — PROMETHAZINE HCL 25 MG/ML IJ SOLN
25.0000 mg | Freq: Once | INTRAMUSCULAR | Status: AC
  Administered 2016-01-14: 25 mg via INTRAVENOUS
  Filled 2016-01-14: qty 1

## 2016-01-14 MED ORDER — SODIUM CHLORIDE 0.9% FLUSH
3.0000 mL | Freq: Two times a day (BID) | INTRAVENOUS | Status: DC
  Administered 2016-01-15 – 2016-01-20 (×9): 3 mL via INTRAVENOUS

## 2016-01-14 MED ORDER — HYDROMORPHONE HCL 1 MG/ML IJ SOLN
2.0000 mg | Freq: Once | INTRAMUSCULAR | Status: AC
  Administered 2016-01-14: 2 mg via INTRAVENOUS

## 2016-01-14 MED ORDER — AMLODIPINE BESYLATE 5 MG PO TABS
5.0000 mg | ORAL_TABLET | Freq: Every day | ORAL | Status: DC
  Administered 2016-01-15 – 2016-01-18 (×3): 5 mg via ORAL
  Filled 2016-01-14 (×4): qty 1

## 2016-01-14 MED ORDER — ACETAMINOPHEN 650 MG RE SUPP: 650.0000 mg | Freq: Four times a day (QID) | RECTAL | Status: DC | PRN

## 2016-01-14 MED ORDER — DIPHENHYDRAMINE HCL 50 MG/ML IJ SOLN
25.0000 mg | Freq: Once | INTRAMUSCULAR | Status: AC
  Administered 2016-01-14: 25 mg via INTRAVENOUS
  Filled 2016-01-14: qty 1

## 2016-01-14 MED ORDER — SODIUM CHLORIDE 0.9% FLUSH
3.0000 mL | INTRAVENOUS | Status: DC | PRN
  Administered 2016-01-22: 3 mL via INTRAVENOUS
  Filled 2016-01-14: qty 3

## 2016-01-14 MED ORDER — SODIUM CHLORIDE 0.9 % IV SOLN: 250.0000 mL | INTRAVENOUS | Status: DC | PRN

## 2016-01-14 MED ORDER — SODIUM CHLORIDE 0.9 % IV SOLN
1000.0000 mL | Freq: Once | INTRAVENOUS | Status: AC
  Administered 2016-01-14: 1000 mL via INTRAVENOUS

## 2016-01-14 MED ORDER — FOLIC ACID 1 MG PO TABS
1.0000 mg | ORAL_TABLET | Freq: Every day | ORAL | Status: DC
Start: 2016-01-14 — End: 2016-01-23
  Administered 2016-01-15 – 2016-01-22 (×6): 1 mg via ORAL
  Filled 2016-01-14 (×6): qty 1

## 2016-01-14 MED ORDER — POLYETHYLENE GLYCOL 3350 17 G PO PACK: 17.0000 g | PACK | Freq: Every day | ORAL | Status: DC | PRN

## 2016-01-14 MED ORDER — HYDROMORPHONE HCL 1 MG/ML IJ SOLN
INTRAMUSCULAR | Status: AC
  Administered 2016-01-14: 2 mg via INTRAVENOUS
  Filled 2016-01-14: qty 2

## 2016-01-14 MED ORDER — ACETAMINOPHEN 325 MG PO TABS: 650.0000 mg | ORAL_TABLET | Freq: Four times a day (QID) | ORAL | Status: DC | PRN

## 2016-01-14 MED ORDER — BISACODYL 5 MG PO TBEC
5.0000 mg | DELAYED_RELEASE_TABLET | Freq: Every day | ORAL | Status: DC | PRN
  Filled 2016-01-14: qty 1

## 2016-01-14 MED ORDER — RENA-VITE PO TABS
1.0000 | ORAL_TABLET | Freq: Every day | ORAL | Status: DC
  Administered 2016-01-14 – 2016-01-21 (×8): 1 via ORAL
  Filled 2016-01-14 (×9): qty 1

## 2016-01-14 MED ORDER — HYDROXYUREA 500 MG PO CAPS
500.0000 mg | ORAL_CAPSULE | Freq: Two times a day (BID) | ORAL | Status: DC
  Administered 2016-01-14 – 2016-01-17 (×6): 500 mg via ORAL
  Filled 2016-01-14 (×7): qty 1

## 2016-01-14 MED ORDER — FLEET ENEMA 7-19 GM/118ML RE ENEM: 1.0000 | ENEMA | Freq: Once | RECTAL | Status: DC | PRN

## 2016-01-14 NOTE — ED Notes (Signed)
Pt requesting "IV phenergan and IV benadryl" for n/v, itching.  IV zofran given prior but pt states "that medicine does not work for me, but I will take it for now."  Admitting provider paged twice, no response at this time.  Pt irritated with care, asking to speak with charge nurse.  Charge nurse at bedside now.

## 2016-01-14 NOTE — Progress Notes (Signed)
[facility]  ROUNDING NOTE   Subjective:   Patient was seen in ED at [facility]. He states he has been off dialysis for over one year. Previously seen in December 2015. At that time patient was getting dialysis in [facility]. However previous to that he was getting dialysis in [place]   Patient is unable to give history at this time. He claims that he does not need dialysis any more His main complaint is pain.   Objective:  Vital signs in last 24 hours:  Temp:  [97.5 F (36.4 C)-98.6 F (37 C)] 97.5 F (36.4 C) (01/26 1356) Pulse Rate:  [81-111] 95 (01/26 1356) Resp:  [0-27] 22 (01/26 1356) BP: (136-206)/(72-102) 136/72 mmHg (01/26 1356) SpO2:  [96 %-100 %] 100 % (01/26 1356) Weight:  [81.647 kg (180 lb)-94.15 kg (207 lb 9 oz)] 81.647 kg (180 lb) (01/26 0703)  Weight change:  Filed Weights   01/14/16 0703  Weight: 81.647 kg (180 lb)    Intake/Output:     Intake/Output this shift:     Physical Exam: General: NAD, laying in bed  Head: Normocephalic, atraumatic. Moist oral mucosal membranes  Eyes: Anicteric, PERRL  Neck: Left IJ hickman  Lungs:  Clear to auscultation  Heart: Regular rate and rhythm  Abdomen:  Soft, nontender,   Extremities: 1+ peripheral edema.  Neurologic: Nonfocal, moving all four extremities  Skin: No lesions  Access: Right arm AVF, left arm clotted AVG    Basic Metabolic Panel:  Recent Labs Lab 01/13/16 2300 01/14/16 0713  NA 140 139  K 4.1 4.0  CL 106 109  CO2 22 21*  GLUCOSE 95 116*  BUN 29* 31*  CREATININE 7.18* 7.14*  CALCIUM 7.7* 7.7*    Liver Function Tests:  Recent Labs Lab 01/13/16 2300 01/14/16 0713  AST 24 24  ALT 8* 7*  ALKPHOS 120 113  BILITOT 0.5 0.8  PROT 7.0 8.3*  ALBUMIN 2.1* 2.7*    Recent Labs Lab 01/13/16 2310 01/14/16 0713  LIPASE 48 53*   No results for input(s): AMMONIA in the last 168 hours.  CBC:  Recent Labs Lab 01/13/16 2300 01/14/16 0713  WBC 2.6* 2.6*   NEUTROABS 1.3* 1.5  HGB 9.0* 10.1*  HCT 29.5* 31.7*  MCV 94.9 94.3  PLT 115* 97*    Cardiac Enzymes: No results for input(s): CKTOTAL, CKMB, CKMBINDEX, TROPONINI in the last 168 hours.  BNP: Invalid input(s): POCBNP  CBG: No results for input(s): GLUCAP in the last 168 hours.  Microbiology: Results for orders placed or performed during the hospital encounter of 07/06/14  Blood culture (routine x 2)     Status: None   Collection Time: 07/06/14  4:58 AM  Result Value Ref Range Status   Specimen Description BLOOD RIGHT HAND  Final   Special Requests BOTTLES DRAWN AEROBIC AND ANAEROBIC [facility] EACH  Final   Culture  Setup Time   Final    07/06/2014 18:07 Performed at [facility]   Culture   Final    NO GROWTH 5 DAYS Performed at [facility]   Report Status 07/12/2014 FINAL  Final    Coagulation Studies: No results for input(s): LABPROT, INR in the last 72 hours.  Urinalysis: No results for input(s): COLORURINE, LABSPEC, PHURINE, GLUCOSEU, HGBUR, BILIRUBINUR, KETONESUR, PROTEINUR, UROBILINOGEN, NITRITE, LEUKOCYTESUR in the last 72 hours.  Invalid input(s): APPERANCEUR    Imaging: Dg Chest 2 View  01/13/2016  CLINICAL DATA:  Sickle cell crisis. Emesis for  2 days. Lower extremity edema for 1 week EXAM: CHEST  2 VIEW COMPARISON:  September 12, 2014 FINDINGS: Central catheter tip is at the cavoatrial junction. No pneumothorax. There are stent grafts in both subclavian regions as well as in the left brachial and axillary regions. There is slight scarring in the left lower lobe. There is cardiomegaly with perihilar interstitial prominence. The pulmonary vascularity is normal. No adenopathy. 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 There is evidence of prior infarcts in each distal clavicle with widening of each acromioclavicular joint. IMPRESSION: Cardiomegaly with slight perihilar edema. No airspace consolidation. Bony changes consistent with  known sickle cell disease. Central catheter tip at cavoatrial junction.  No pneumothorax. Electronically Signed   By: Bretta Bang III M.D.   On: 01/13/2016 20:39     Medications:     . amLODipine  5 mg Oral Daily  . calcitRIOL  0.25 mcg Oral Daily  . calcium acetate  2,001 mg Oral TID WC  . folic acid  1 mg Oral Daily  . hydroxyurea  500 mg Oral BID  . lisinopril  10 mg Oral Daily  . multivitamin  1 tablet Oral QHS  . sevelamer carbonate  800 mg Oral TID WC  . sodium chloride flush  3 mL Intravenous Q12H   sodium chloride, acetaminophen **OR** acetaminophen, albuterol, bisacodyl, diphenhydrAMINE, HYDROmorphone (DILAUDID) injection, HYDROmorphone, ondansetron **OR** ondansetron (ZOFRAN) IV, polyethylene glycol, sodium chloride flush, sodium phosphate  Assessment/ Plan:  [name] is a 35 y.o. black male with HIV/AIDS, ESRD, hypertension, sickle cell disease  1. ESRD: I [facility] outpatient records. He seems have last gotten outpatient dialysis in 08/2014 in [facility] followed by Dr. Birdie Riddle. However previously followed in [place], [place] going to [facility]. He seems to be traveling to several different hospitals with most recent visit to [facility] yesterday. He was thought to have an established outpatient dialysis center at that time.  When trying to discharge patient from the ED this morning, I was unable to find that he had an outpatient dialysis chair.  If he truly has not gotten dialysis since 2015, will need to admit patient and reinitiate dialysis.  - Orders prepared for dialysis later today - Dialysis liason aware  2. 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 Hemoglobin 10.1 as this is goal for sickle cell disease - hold epo for now.   3. Hypertension: elevated. He claims that he does not take blood pressure medications. However lisinopril, amlodipine on  chart  4. Secondary Hyperparathyroidism: hypocalcemia, no phos or PTH available.  - Check phos and PTH - not currently on a binder.    LOS: 0 [name] 1/26/20172:07 PM

## 2016-01-14 NOTE — ED Notes (Signed)
Pt states nausea and pain for 2 days, states hx of sickle cell crisis, MD at bedside, pt awake and alert, right arm fistula in place but pt states he does not use it anymore, central line in place left chest, per Dr. Mayford Knife okay to use, pt wheelchair bound at this time states due to flare up

## 2016-01-14 NOTE — H&P (Signed)
[facility] [place] at [facility]   PATIENT NAME: [name]    MR#:  [id number]  DATE OF BIRTH:  [birth year]  DATE OF ADMISSION:  01/14/2016  PRIMARY CARE PHYSICIAN: Pcp Not In System   REQUESTING/REFERRING PHYSICIAN: Dr. Mayford Knife  CHIEF COMPLAINT:   Chief Complaint  Patient presents with  . Sickle Cell Pain Crisis    HISTORY OF PRESENT ILLNESS:  [name]  is a 35 y.o. male with a known history of ESRD, HIV, HTN presents with genralized joint pains and vomiting. Has not been on HD for 1 yr. He has moved places and has PCP in [place], [place]. Also was incarcerated in [place] for sometime recently. Discussed with Dr. Wynelle Link who has called dialysis centers around and he has not had dialysis. Now he seems to have uremic symptoms and needs inpatient dialaysis. NO SOB/orthopnea. Still makes urine. Has a hickman due to poor IV access placed in [facility] 6 months back.  PAST MEDICAL HISTORY:   Past Medical History  Diagnosis Date  . Sickle cell anemia (HCC)   . ESRD on hemodialysis (HCC)   . HIV disease (HCC)   . Drug-seeking behavior   . Sickle cell anemia (HCC)     States Dx at 35 yo, s/p multiple prior transfusions, typically with pain in legs, back  . HIV (human immunodeficiency virus infection) (HCC) 2003    Followed by Harless Nakayama at [facility], [place]   . HTN (hypertension)   . Pulmonary mass     Unclear history. States PCP was following a pulmonary mass with serial imaging  . Pancreatitis 2012    Denies any alcohol use   . Hypertension   . Blood transfusion     "several"  . End stage renal disease on dialysis [facility]) HD in 2009     Followed by Dr. Buelah Manis in [place]. States due to HTN/DM.   Marland Kitchen ESRD (end stage renal disease) on dialysis [facility])     "[facility]; [place], [place]; TTS" (08/26/2014)  . Renal failure     PAST SURGICAL HISTORY:   Past Surgical History  Procedure Laterality Date  . Hip surgery    . Dialysis fistula  creation Left     "arm"  . Hip fracture surgery Right     "screw placed into joint"  . Av fistula placement Left     "arm"    SOCIAL HISTORY:   Social History  Substance Use Topics  . Smoking status: Current Every Day Smoker -- 0.12 packs/day for 18 years    Types: Cigarettes  . Smokeless tobacco: Never Used  . Alcohol Use: No    FAMILY HISTORY:   Family History  Problem Relation Age of Onset  . Hypertension Father   . Diabetes      Uncles and grandfather  . Cervical cancer Mother     Dec 46yo of cervical ca    DRUG ALLERGIES:  No Known Allergies  REVIEW OF SYSTEMS:   Review of Systems  Constitutional: Positive for malaise/fatigue. Negative for fever, chills and weight loss.  HENT: Negative for hearing loss and nosebleeds.   Eyes: Negative for blurred vision, double vision and pain.  Respiratory: Negative for cough, hemoptysis, sputum production, shortness of breath and wheezing.   Cardiovascular: Negative for chest pain, palpitations, orthopnea and leg swelling.  Gastrointestinal: Positive for nausea and vomiting. Negative for abdominal pain, diarrhea and constipation.  Genitourinary: Negative for dysuria and hematuria.  Musculoskeletal: Positive for myalgias and joint  pain. Negative for back pain and falls.  Skin: Negative for rash.  Neurological: Negative for dizziness, tremors, sensory change, speech change, focal weakness, seizures and headaches.  Endo/Heme/Allergies: Does not bruise/bleed easily.  Psychiatric/Behavioral: Negative for depression and memory loss. The patient is not nervous/anxious.     MEDICATIONS AT HOME:   Prior to Admission medications   Medication Sig Start Date End Date Taking? Authorizing Provider  amLODipine (NORVASC) 5 MG tablet Take 5 mg by mouth daily.   Yes Historical Provider, MD  b complex-vitamin c-folic acid (NEPHRO-VITE) 0.8 MG TABS tablet Take 1 tablet by mouth daily.    Yes Historical Provider, MD  calcitRIOL (ROCALTROL)  0.25 MCG capsule Take 0.25 mcg by mouth daily.   Yes Historical Provider, MD  calcium acetate (PHOSLO) 667 MG capsule Take 2,001 mg by mouth 3 (three) times daily with meals.   Yes Historical Provider, MD  diphenhydrAMINE (BENADRYL) 50 MG capsule Take 50 mg by mouth every 6 (six) hours as needed for itching or allergies.   Yes Historical Provider, MD  folic acid (FOLVITE) 1 MG tablet Take 1 mg by mouth daily.   Yes Historical Provider, MD  HYDROmorphone (DILAUDID) 8 MG tablet Take 8 mg by mouth every 4 (four) hours as needed for severe pain.   Yes Historical Provider, MD  hydroxyurea (HYDREA) 500 MG capsule Take 500 mg by mouth 2 (two) times daily. May take with food to minimize GI side effects.   Yes Historical Provider, MD  lisinopril (PRINIVIL,ZESTRIL) 10 MG tablet Take 10 mg by mouth daily.   Yes Historical Provider, MD  ondansetron (ZOFRAN ODT) 4 MG disintegrating tablet Take 1 tablet (4 mg total) by mouth every 8 (eight) hours as needed for nausea or vomiting. 01/13/16  Yes Lyndal Pulley, MD  sevelamer carbonate (RENVELA) 800 MG tablet Take 800 mg by mouth 3 (three) times daily with meals.   Yes Historical Provider, MD      VITAL SIGNS:  Blood pressure 153/94, pulse 98, temperature 97.5 F (36.4 C), temperature source Oral, resp. rate 16, height  (1.702 m), weight 81.647 kg (180 lb), SpO2 100 %.  PHYSICAL EXAMINATION:  Physical Exam  GENERAL:  36 y.o.-year-old patient lying in the bed with no acute distress.  EYES: Pupils equal, round, reactive to light and accommodation. No scleral icterus. Extraocular muscles intact.  HEENT: Head atraumatic, normocephalic. Oropharynx and nasopharynx clear. No oropharyngeal erythema, moist oral mucosa  NECK:  Supple, no jugular venous distention. No thyroid enlargement, no tenderness.  LUNGS: Normal breath sounds bilaterally, no wheezing, rales, rhonchi. No use of accessory muscles of respiration.  CARDIOVASCULAR: S1, S2 normal. No murmurs, rubs, or  gallops.  ABDOMEN: Soft, nontender, nondistended. Bowel sounds present. No organomegaly or mass.  EXTREMITIES: No pedal edema, cyanosis, or clubbing. + 2 pedal & radial pulses b/l.   NEUROLOGIC: Cranial nerves II through XII are intact. No focal Motor or sensory deficits appreciated b/l PSYCHIATRIC: The patient is alert and oriented x 3. Good affect.  SKIN: No obvious rash, lesion, or ulcer. LE stasis changes  Hickman catheter. LUE AV graft  LABORATORY PANEL:   CBC  Recent Labs Lab 01/14/16 0713  WBC 2.6*  HGB 10.1*  HCT 31.7*  PLT 97*   ------------------------------------------------------------------------------------------------------------------  Chemistries   Recent Labs Lab 01/14/16 0713  NA 139  K 4.0  CL 109  CO2 21*  GLUCOSE 116*  BUN 31*  CREATININE 7.14*  CALCIUM 7.7*  AST 24  ALT 7*  ALKPHOS  113  BILITOT 0.8   ------------------------------------------------------------------------------------------------------------------  Cardiac Enzymes No results for input(s): TROPONINI in the last 168 hours. ------------------------------------------------------------------------------------------------------------------  RADIOLOGY:  Dg Chest 2 View  01/13/2016  CLINICAL DATA:  Sickle cell crisis. Emesis for 2 days. Lower extremity edema for 1 week EXAM: CHEST  2 VIEW COMPARISON:  September 12, 2014 FINDINGS: Central catheter tip is at the cavoatrial junction. No pneumothorax. There are stent grafts in both subclavian regions as well as in the left brachial and axillary regions. There is slight scarring in the left lower lobe. There is cardiomegaly with perihilar interstitial prominence. The pulmonary vascularity is normal. No adenopathy. 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 There is evidence of prior infarcts in each distal clavicle with widening of each acromioclavicular joint. IMPRESSION: Cardiomegaly with slight perihilar edema. No  airspace consolidation. Bony changes consistent with known sickle cell disease. Central catheter tip at cavoatrial junction.  No pneumothorax. Electronically Signed   By: Bretta Bang III M.D.   On: 01/13/2016 20:39     IMPRESSION AND PLAN:   * ESRD with uremia Vomiting likely from uremia. Discussed with Dr. Wynelle Link. Will need inpatient HD. May need it 2-3 days  * Chronic pain syndrome Confirmed pain meds from his pharmacy. Change to IV due to vomiting  * HTN Continue home meds  * HIV Not on meds. Needs ID f/u as OP  * Anemia. Sickle cell vs chronic disease He does have chronic pancytopenia  I have ordered hemoglobinopathy screen as his Sickle cell diagnosis is unclear  * DVT prophylaxis SCDs No heparin due to thrombocytopenia   All the records are reviewed and case discussed with ED provider. Management plans discussed with the patient, family and they are in agreement.  CODE STATUS: FULL  TOTAL TIME TAKING CARE OF THIS PATIENT: 40 minutes.    Milagros Loll R M.D on 01/14/2016 at 11:31 AM  Between 7am to 6pm - Pager - [phone]  After 6pm go to [email or web address] - password [email or web address]  [facility]  Office  [phone]  CC: Primary care physician; Pcp Not In System   Note: This dictation was prepared with Dragon dictation along with smaller phrase technology. Any transcriptional errors that result from this process are unintentional.

## 2016-01-14 NOTE — ED Notes (Signed)
Pt asking for more pain/nausea medication; waiting for orders from admitting provider.  Pt made aware.

## 2016-01-14 NOTE — ED Notes (Signed)
Pt sitting in bed playing on cell phone, askng when he will be admited, pt updated on plan of care

## 2016-01-14 NOTE — ED Notes (Signed)
Pt resting in bed playing on cell phone

## 2016-01-14 NOTE — ED Provider Notes (Addendum)
[facility] Emergency Department Provider Note     Time seen: ----------------------------------------- 7:08 AM on 01/14/2016 -----------------------------------------    I have reviewed the triage vital signs and the nursing notes.   HISTORY  Chief Complaint Sickle Cell Pain Crisis    HPI [name] is a 35 y.o. male who presents ER with nausea and diffuse pain for last 48 hours. Patient has a history of sickle cell disease. Patient states his history of dialysis but has not been dialyzing last year. He does have a Hickman catheter in left chest. He denies fevers, chills, chest pain, shortness of breath or diarrhea. Patient does have diffuse vomiting for last 48 hours and inability to keep anything down. He has diffuse joint pain during the same period of time.   Past Medical History  Diagnosis Date  . Sickle cell anemia (HCC)   . ESRD on hemodialysis (HCC)   . HIV disease (HCC)   . Drug-seeking behavior   . Sickle cell anemia (HCC)     States Dx at 35 yo, s/p multiple prior transfusions, typically with pain in legs, back  . HIV (human immunodeficiency virus infection) (HCC) 2003    Followed by Harless Nakayama at [facility], [place]   . HTN (hypertension)   . Pulmonary mass     Unclear history. States PCP was following a pulmonary mass with serial imaging  . Pancreatitis 2012    Denies any alcohol use   . Hypertension   . Blood transfusion     "several"  . End stage renal disease on dialysis [facility]) HD in 2009     Followed by Dr. Buelah Manis in [place]. States due to HTN/DM.   Marland Kitchen ESRD (end stage renal disease) on dialysis [facility])     "[facility] [place]; [place], [place]; TTS" (08/26/2014)  . Renal failure     Patient Active Problem List   Diagnosis Date Noted  . AIDS (acquired immune deficiency syndrome) (HCC) 11/17/2014  . ESRD (end stage renal disease) on dialysis (HCC)   . Sickle cell crisis (HCC) 11/15/2014  . Sickle cell anemia with pain  (HCC) 08/26/2014  . Sickle cell pain crisis (HCC) 08/26/2014  . Acute chest syndrome (HCC) 07/06/2014  . Acute chest pain 07/06/2014  . Pancreatitis 03/11/2012  . Hyperkalemia 03/11/2012  . Hyponatremia 03/11/2012  . Hypochloremia 03/11/2012  . Pulmonary mass   . HTN (hypertension)   . End stage renal disease on dialysis (HCC)   . HIV (human immunodeficiency virus infection) (HCC)   . Sickle cell anemia (HCC)   . DM (diabetes mellitus) (HCC)   . Pancreatitis, acute 03/10/2012  . ESRD on dialysis (HCC) 03/10/2012  . Sickle cell trait (HCC) 03/10/2012  . DM (diabetes mellitus) (HCC) 03/10/2012  . HTN (hypertension) 03/10/2012  . HIV (human immunodeficiency virus infection) (HCC) 03/10/2012  . Noncompliance of patient with renal dialysis (HCC) 03/10/2012  . History of noncompliance with medical treatment 03/10/2012    Past Surgical History  Procedure Laterality Date  . Hip surgery    . Dialysis fistula creation Left     "arm"  . Hip fracture surgery Right     "screw placed into joint"  . Av fistula placement Left     "arm"    Allergies Review of patient's allergies indicates no known allergies.  Social History Social History  Substance Use Topics  . Smoking status: Current Every Day Smoker -- 0.12 packs/day for 18 years    Types: Cigarettes  . Smokeless tobacco:  Never Used  . Alcohol Use: No    Review of Systems Constitutional: Negative for fever. Eyes: Negative for visual changes. ENT: Negative for sore throat. Cardiovascular: Negative for chest pain. Respiratory: Negative for shortness of breath. Gastrointestinal: Patient describes sore abdomen on the left side with vomiting but no diarrhea Genitourinary: Negative for dysuria. Musculoskeletal: Positive for diffuse joint pain and myalgias Skin: Negative for rash. Neurological: Negative for headaches, focal weakness or numbness.  10-point ROS otherwise  negative.  ____________________________________________   PHYSICAL EXAM:  VITAL SIGNS: ED Triage Vitals  Enc Vitals Group     BP 01/14/16 0703 185/95 mmHg     Pulse Rate 01/14/16 0703 91     Resp 01/14/16 0703 18     Temp 01/14/16 0703 97.5 F (36.4 C)     Temp Source 01/14/16 0703 Oral     SpO2 01/14/16 0703 100 %     Weight 01/14/16 0703 180 lb (81.647 kg)     Height 01/14/16 0703  (1.702 m)     Head Cir --      Peak Flow --      Pain Score 01/14/16 0658 10     Pain Loc --      Pain Edu? --      Excl. in GC? --     Constitutional: Alert and oriented. Well appearing and in no distress. Eyes: Conjunctivae are normal. PERRL. Normal extraocular movements. ENT   Head: Normocephalic and atraumatic.   Nose: No congestion/rhinnorhea.   Mouth/Throat: Mucous membranes are moist.   Neck: No stridor. Cardiovascular: Normal rate, regular rhythm. Normal and symmetric distal pulses are present in all extremities. No murmurs, rubs, or gallops. Respiratory: Normal respiratory effort without tachypnea nor retractions. Breath sounds are clear and equal bilaterally. No wheezes/rales/rhonchi. Gastrointestinal: Soft and nontender. No distention. No abdominal bruits.  Musculoskeletal: Nontender with normal range of motion in all extremities. No joint effusions.  No lower extremity tenderness, bilateral pitting edema Neurologic:  Normal speech and language. No gross focal neurologic deficits are appreciated. Speech is normal. Skin:  Skin is warm, dry and intact. No rash noted. Psychiatric: Mood and affect are normal. Speech and behavior are normal. Patient exhibits appropriate insight and judgment. ____________________________________________  ED COURSE:  Pertinent labs & imaging results that were available during my care of the patient were reviewed by me and considered in my medical decision making (see chart for details). Patient is in no acute distress, will check basic  labs, give IV fluid and antiemetics. ____________________________________________    LABS (pertinent positives/negatives)  Labs Reviewed  CBC WITH DIFFERENTIAL/PLATELET - Abnormal; Notable for the following:    WBC 2.6 (*)    RBC 3.36 (*)    Hemoglobin 10.1 (*)    HCT 31.7 (*)    MCHC 31.8 (*)    RDW 21.4 (*)    Platelets 97 (*)    Lymphs Abs 0.5 (*)    All other components within normal limits  COMPREHENSIVE METABOLIC PANEL - Abnormal; Notable for the following:    CO2 21 (*)    Glucose, Bld 116 (*)    BUN 31 (*)    Creatinine, Ser 7.14 (*)    Calcium 7.7 (*)    Total Protein 8.3 (*)    Albumin 2.7 (*)    ALT 7 (*)    GFR calc non Af Amer 9 (*)    GFR calc Af Amer 10 (*)    All other components within normal limits  LIPASE, BLOOD - Abnormal; Notable for the following:    Lipase 53 (*)    All other components within normal limits  URINALYSIS COMPLETEWITH MICROSCOPIC ([facility] ONLY)   ____________________________________________  FINAL ASSESSMENT AND PLAN  Vomiting with sickle cell pain crisis  Plan: Patient with labs and imaging as dictated above. Patient with chronic kidney disease, will discuss with her nephrologist to evaluate for possible need for dialysis. I have been reviewing his chart in care everywhere and there is some potentially concerning activity in terms of the number of hospitals that he visits. Patient states he has not had dialysis recently.   Emily Filbert, MD  I discussed with nephrology Will, evaluate in the ER. Emily Filbert, MD 01/14/16 1610  Emily Filbert, MD 01/14/16 9604  Emily Filbert, MD 01/14/16 [phone]

## 2016-01-14 NOTE — ED Notes (Signed)
Pt states he is here for sickle cell crisis. Pain in joints X 2 days. Pt uses walker to ambulate. Pt alert and oriented X4, active, cooperative, pt in NAD. RR even and unlabored, color WNL.

## 2016-01-14 NOTE — ED Notes (Signed)
Waiting on flush from pharmacy prior to pt d/c at this time, pt made aware of the same

## 2016-01-14 NOTE — ED Notes (Signed)
Pt states he does not want to complete his fluids at this time

## 2016-01-14 NOTE — ED Notes (Signed)
Pt asking for more pain and nausea medication, ER MD notified, pt to be admitted, pt made aware

## 2016-01-15 LAB — HEPATITIS B SURFACE ANTIBODY,QUALITATIVE: Hep B S Ab: NONREACTIVE

## 2016-01-15 LAB — HEPATITIS B CORE ANTIBODY, TOTAL: Hep B Core Total Ab: NEGATIVE

## 2016-01-15 LAB — HEPATITIS B SURFACE ANTIGEN: Hepatitis B Surface Ag: NEGATIVE

## 2016-01-15 NOTE — Progress Notes (Signed)
[facility] -  at [facility]   PATIENT NAME: [name]    MR#:  [id number]  DATE OF BIRTH:  02/22/1981  SUBJECTIVE:  Patient doing well no issues Has HICKMAN for IV access   REVIEW OF SYSTEMS:    Review of Systems  Constitutional: Negative for fever, chills and malaise/fatigue.  HENT: Negative for sore throat.   Eyes: Negative for blurred vision.  Respiratory: Negative for cough, hemoptysis, shortness of breath and wheezing.   Cardiovascular: Negative for chest pain, palpitations and leg swelling.  Gastrointestinal: Negative for nausea, vomiting, abdominal pain, diarrhea and blood in stool.  Genitourinary: Negative for dysuria.  Musculoskeletal: Negative for back pain.  Neurological: Negative for dizziness, tremors and headaches.  Endo/Heme/Allergies: Does not bruise/bleed easily.    Tolerating Diet:yes      DRUG ALLERGIES:  No Known Allergies  VITALS:  Blood pressure 176/91, pulse 103, temperature 98.2 F (36.8 C), temperature source Oral, resp. rate 18, height  (1.702 m), weight 95.573 kg (210 lb 11.2 oz), SpO2 96 %.  PHYSICAL EXAMINATION:   Physical Exam  Constitutional: He is oriented to person, place, and time and well-developed, well-nourished, and in no distress. No distress.  HENT:  Head: Normocephalic.  Eyes: No scleral icterus.  Neck: Normal range of motion. Neck supple. No JVD present. No tracheal deviation present.  Cardiovascular: Normal rate, regular rhythm and normal heart sounds.  Exam reveals no gallop and no friction rub.   No murmur heard. Pulmonary/Chest: Effort normal and breath sounds normal. No respiratory distress. He has no wheezes. He has no rales. He exhibits no tenderness.  Abdominal: Soft. Bowel sounds are normal. He exhibits no distension and no mass. There is no tenderness. There is no rebound and no guarding.  Musculoskeletal: Normal range of motion. He exhibits no edema.  Neurological:  He is alert and oriented to person, place, and time.  Skin: Skin is warm. No rash noted. No erythema.  Right arm AV fistula  Psychiatric: Affect and judgment normal.      LABORATORY PANEL:   CBC  Recent Labs Lab 01/14/16 0713  WBC 2.6*  HGB 10.1*  HCT 31.7*  PLT 97*   ------------------------------------------------------------------------------------------------------------------  Chemistries   Recent Labs Lab 01/14/16 0713  NA 139  K 4.0  CL 109  CO2 21*  GLUCOSE 116*  BUN 31*  CREATININE 7.14*  CALCIUM 7.7*  AST 24  ALT 7*  ALKPHOS 113  BILITOT 0.8   ------------------------------------------------------------------------------------------------------------------  Cardiac Enzymes No results for input(s): TROPONINI in the last 168 hours. ------------------------------------------------------------------------------------------------------------------  RADIOLOGY:  Dg Chest 2 View  01/13/2016  CLINICAL DATA:  Sickle cell crisis. Emesis for 2 days. Lower extremity edema for 1 week EXAM: CHEST  2 VIEW COMPARISON:  September 12, 2014 FINDINGS: Central catheter tip is at the cavoatrial junction. No pneumothorax. There are stent grafts in both subclavian regions as well as in the left brachial and axillary regions. There is slight scarring in the left lower lobe. There is cardiomegaly with perihilar interstitial prominence. The pulmonary vascularity is normal. No adenopathy. 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 There is evidence of prior infarcts in each distal clavicle with widening of each acromioclavicular joint. IMPRESSION: Cardiomegaly with slight perihilar edema. No airspace consolidation. Bony changes consistent with known sickle cell disease. Central catheter tip at cavoatrial junction.  No pneumothorax. Electronically Signed   By: Bretta Bang III M.D.   On: 01/13/2016 20:39  ASSESSMENT AND PLAN:   [name] is a 35 y.o. black male with HIV/AIDS, ESRD, hypertension, sickle cell disease presented with uremic symptoms  1. ESRD with uremia: Appreciate NEPHROLOGY consult  HD as per nephrology, scheduled for tomorrow. He now lives in [place] and needs to establish a HD place in [place] CM assisting with placement  2. 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  3. Hypertension: Continue lisinopril and amlodipine   4. HIV: He states his CD$ count is high and is not taking medications. I spoke with Dr Sampson Goon who will refer patient to HIV clinic in [place]  5. Chronic pain: Continue pain meds as taking as outpatient   Management plans discussed with the patient and he is in agreement.  CODE STATUS: FULL  TOTAL TIME TAKING CARE OF THIS PATIENT: 30 minutes.     POSSIBLE D/C 2-3 days, DEPENDING ON CLINICAL CONDITION. NEEDS HD center established prior to discharge   Lynzy Rawles M.D on 01/15/2016 at 2:42 PM  Between 7am to 6pm - Pager - [phone] After 6pm go to [email or web address] - password [email or web address]  [facility]  Office  [phone]  CC: Primary care physician; Pcp Not In System  Note: This dictation was prepared with Dragon dictation along with smaller phrase technology. Any transcriptional errors that result from this process are unintentional.

## 2016-01-15 NOTE — Care Management (Signed)
Patient states that he has never had an outpatient dialysis center.  Patient states "I have never had to have long term dialysis."  However patient has a fistula.  Dr. Wynelle Link notified.  Ivor Reining dialysis liaison notified.

## 2016-01-15 NOTE — Progress Notes (Signed)
ID visit Dr Juliene Pina asked regarding pts request to transfer care from [facility] in [place] to locally here given reloaction. Pt says not on ART- told last cd4 was 500 so no need yet  I have arranged to see him at [facility] in next 2 weeks. Discussed this with  Patient Will check cd4, vl and genotype. Per my review last cd4 in our system was in 2015 and was 100

## 2016-01-15 NOTE — Progress Notes (Signed)
[facility]  ROUNDING NOTE   Subjective:   Hemodialysis yesterday. Tolerated treatment well. Will need outpatient planning  Objective:  Vital signs in last 24 hours:  Temp:  [97.1 F (36.2 C)-98.1 F (36.7 C)] 97.7 F (36.5 C) (01/26 2118) Pulse Rate:  [86-111] 96 (01/26 2118) Resp:  [0-27] 16 (01/26 2118) BP: (136-183)/(72-101) 163/83 mmHg (01/26 2118) SpO2:  [94 %-100 %] 94 % (01/26 2118) Weight:  [95.573 kg (210 lb 11.2 oz)] 95.573 kg (210 lb 11.2 oz) (01/27 0500)  Weight change:  Filed Weights   01/14/16 0703 01/15/16 0500  Weight: 81.647 kg (180 lb) 95.573 kg (210 lb 11.2 oz)    Intake/Output: I/O last 3 completed shifts: In: -  Out: 1500 [Other:1500]   Intake/Output this shift:     Physical Exam: General: NAD, laying in bed  Head: Normocephalic, atraumatic. Moist oral mucosal membranes  Eyes: Anicteric, PERRL  Neck: Left IJ hickman  Lungs:  Clear to auscultation  Heart: Regular rate and rhythm  Abdomen:  Soft, nontender,   Extremities: 1+ peripheral edema.  Neurologic: Nonfocal, moving all four extremities  Skin: No lesions  Access: Right arm AVF, left arm clotted AVG    Basic Metabolic Panel:  Recent Labs Lab 01/13/16 2300 01/14/16 0713  NA 140 139  K 4.1 4.0  CL 106 109  CO2 22 21*  GLUCOSE 95 116*  BUN 29* 31*  CREATININE 7.18* 7.14*  CALCIUM 7.7* 7.7*    Liver Function Tests:  Recent Labs Lab 01/13/16 2300 01/14/16 0713  AST 24 24  ALT 8* 7*  ALKPHOS 120 113  BILITOT 0.5 0.8  PROT 7.0 8.3*  ALBUMIN 2.1* 2.7*    Recent Labs Lab 01/13/16 2310 01/14/16 0713  LIPASE 48 53*   No results for input(s): AMMONIA in the last 168 hours.  CBC:  Recent Labs Lab 01/13/16 2300 01/14/16 0713  WBC 2.6* 2.6*  NEUTROABS 1.3* 1.5  HGB 9.0* 10.1*  HCT 29.5* 31.7*  MCV 94.9 94.3  PLT 115* 97*    Cardiac Enzymes: No results for input(s): CKTOTAL, CKMB, CKMBINDEX, TROPONINI in the last 168 hours.  BNP: Invalid  input(s): POCBNP  CBG: No results for input(s): GLUCAP in the last 168 hours.  Microbiology: Results for orders placed or performed during the hospital encounter of 07/06/14  Blood culture (routine x 2)     Status: None   Collection Time: 07/06/14  4:58 AM  Result Value Ref Range Status   Specimen Description BLOOD RIGHT HAND  Final   Special Requests BOTTLES DRAWN AEROBIC AND ANAEROBIC [facility] EACH  Final   Culture  Setup Time   Final    07/06/2014 18:07 Performed at [facility]   Culture   Final    NO GROWTH 5 DAYS Performed at [facility]   Report Status 07/12/2014 FINAL  Final    Coagulation Studies: No results for input(s): LABPROT, INR in the last 72 hours.  Urinalysis: No results for input(s): COLORURINE, LABSPEC, PHURINE, GLUCOSEU, HGBUR, BILIRUBINUR, KETONESUR, PROTEINUR, UROBILINOGEN, NITRITE, LEUKOCYTESUR in the last 72 hours.  Invalid input(s): APPERANCEUR    Imaging: Dg Chest 2 View  01/13/2016  CLINICAL DATA:  Sickle cell crisis. Emesis for 2 days. Lower extremity edema for 1 week EXAM: CHEST  2 VIEW COMPARISON:  September 12, 2014 FINDINGS: Central catheter tip is at the cavoatrial junction. No pneumothorax. There are stent grafts in both subclavian regions as well as in the left brachial and axillary regions. There  is slight scarring in the left lower lobe. There is cardiomegaly with perihilar interstitial prominence. The pulmonary vascularity is normal. No adenopathy. 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 There is evidence of prior infarcts in each distal clavicle with widening of each acromioclavicular joint. IMPRESSION: Cardiomegaly with slight perihilar edema. No airspace consolidation. Bony changes consistent with known sickle cell disease. Central catheter tip at cavoatrial junction.  No pneumothorax. Electronically Signed   By: Bretta Bang III M.D.   On: 01/13/2016 20:39     Medications:     . amLODipine  5 mg  Oral Daily  . calcitRIOL  0.25 mcg Oral Daily  . calcium acetate  2,001 mg Oral TID WC  . folic acid  1 mg Oral Daily  . hydroxyurea  500 mg Oral BID  . lisinopril  10 mg Oral Daily  . multivitamin  1 tablet Oral QHS  . sevelamer carbonate  800 mg Oral TID WC  . sodium chloride flush  3 mL Intravenous Q12H   sodium chloride, acetaminophen **OR** acetaminophen, albuterol, bisacodyl, diphenhydrAMINE, diphenhydrAMINE, HYDROmorphone (DILAUDID) injection, HYDROmorphone, [DISCONTINUED] ondansetron **OR** ondansetron (ZOFRAN) IV, ondansetron, polyethylene glycol, sodium chloride flush, sodium phosphate  Assessment/ Plan:  [name] is a 35 y.o. black male with HIV/AIDS, ESRD, hypertension, sickle cell disease  1. ESRD: Tolerated dialysis treatment well yesterday. Next treatment tentatively scheduled for tomorrow.  Seems have last gotten outpatient dialysis in 08/2014 in [facility] followed by Dr. Birdie Riddle. However previously followed in [place], [place] going to [facility]. He now lives in [place] and needs a closer home unit - Dialysis liason aware. Discussed with social work and Herbalist.   2. 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 Hemoglobin 10.1 as this is goal for sickle cell disease - hold epo for now.   3. Hypertension: elevated.  - lisinopril and amlodipine  4. Secondary Hyperparathyroidism: hypocalcemia, no phos or PTH available.  - Check phos and PTH - not currently on a binder.    LOS: 1 [name] 1/27/201710:18 AM

## 2016-01-15 NOTE — Progress Notes (Signed)
Initial Nutrition Assessment     INTERVENTION:  Meals and snacks: Cater to pt preferences, monitor intake Nutrition diet education: Pt familiar with diet restrictions, no further education needed per pt at this time   NUTRITION DIAGNOSIS:    (none at this time) related to   as evidenced by  .    GOAL:   Patient will meet greater than or equal to 90% of their needs     MONITOR:    (Energy intake, Electrolyte and renal profile)  REASON FOR ASSESSMENT:    (dialysis pt)    ASSESSMENT:       Pt admitted with sickle cell crisis, uremic symptoms, needing HD  Past Medical History  Diagnosis Date  . Sickle cell anemia (HCC)   . ESRD on hemodialysis (HCC)   . HIV disease (HCC)   . Drug-seeking behavior   . Sickle cell anemia (HCC)     States Dx at 35 yo, s/p multiple prior transfusions, typically with pain in legs, back  . HIV (human immunodeficiency virus infection) (HCC) 2003    Followed by Harless Nakayama at [facility], [place]   . HTN (hypertension)   . Pulmonary mass     Unclear history. States PCP was following a pulmonary mass with serial imaging  . Pancreatitis 2012    Denies any alcohol use   . Hypertension   . Blood transfusion     "several"  . End stage renal disease on dialysis [facility]) HD in 2009     Followed by Dr. Buelah Manis in [place]. States due to HTN/DM.   Marland Kitchen ESRD (end stage renal disease) on dialysis [facility])     [facility] [place]; [place], [place]; TTS" (08/26/2014)  . Renal failure     Current Nutrition: poor po intake for the past 2-3 days secondary to nausea, vomiting, tray observed  Food/Nutrition-Related History: see above   Scheduled Medications:  . amLODipine  5 mg Oral Daily  . calcitRIOL  0.25 mcg Oral Daily  . calcium acetate  2,001 mg Oral TID WC  . folic acid  1 mg Oral Daily  . hydroxyurea  500 mg Oral BID  . multivitamin  1 tablet Oral QHS  . sevelamer carbonate  800 mg Oral TID WC  . sodium chloride flush  3 mL Intravenous Q12H        Electrolyte/Renal Profile and Glucose Profile:   Recent Labs Lab 01/13/16 2300 01/14/16 0713  NA 140 139  K 4.1 4.0  CL 106 109  CO2 22 21*  BUN 29* 31*  CREATININE 7.18* 7.14*  CALCIUM 7.7* 7.7*  GLUCOSE 95 116*   Protein Profile:  Recent Labs Lab 01/13/16 2300 01/14/16 0713  ALBUMIN 2.1* 2.7*    Gastrointestinal Profile: Last BM:1/26    Weight Change: wt gain per wt encounters    Diet Order:  Diet renal with fluid restriction Fluid restriction:: 1200 mL Fluid; Room service appropriate?: Yes; Fluid consistency:: Thin  Skin:   reviewed  Height:   Ht Readings from Last 1 Encounters:  01/14/16  (1.702 m)    Weight:   Wt Readings from Last 1 Encounters:  01/15/16 210 lb 11.2 oz (95.573 kg)    Ideal Body Weight:     BMI:  Body mass index is 32.99 kg/(m^2).   EDUCATION NEEDS:   No education needs identified at this time  LOW Care Level  Marsela Kuan B. Freida Busman, RD, LDN [phone] (pager) Weekend/On-Call pager [phone])

## 2016-01-16 LAB — PHOSPHORUS: Phosphorus: 5.4 mg/dL — ABNORMAL HIGH (ref 2.5–4.6)

## 2016-01-16 LAB — CBC
HEMATOCRIT: 27.6 % — AB (ref 40.0–52.0)
Hemoglobin: 8.8 g/dL — ABNORMAL LOW (ref 13.0–18.0)
MCH: 29.4 pg (ref 26.0–34.0)
MCHC: 31.9 g/dL — AB (ref 32.0–36.0)
MCV: 92.1 fL (ref 80.0–100.0)
Platelets: 61 10*3/uL — ABNORMAL LOW (ref 150–440)
RBC: 3 MIL/uL — ABNORMAL LOW (ref 4.40–5.90)
RDW: 21.8 % — AB (ref 11.5–14.5)
WBC: 1.8 10*3/uL — ABNORMAL LOW (ref 3.8–10.6)

## 2016-01-16 LAB — RENAL FUNCTION PANEL
ALBUMIN: 2.2 g/dL — AB (ref 3.5–5.0)
Anion gap: 7 (ref 5–15)
BUN: 30 mg/dL — AB (ref 6–20)
CHLORIDE: 104 mmol/L (ref 101–111)
CO2: 26 mmol/L (ref 22–32)
Calcium: 7.8 mg/dL — ABNORMAL LOW (ref 8.9–10.3)
Creatinine, Ser: 7.51 mg/dL — ABNORMAL HIGH (ref 0.61–1.24)
GFR calc Af Amer: 10 mL/min — ABNORMAL LOW (ref >60)
GFR calc non Af Amer: 8 mL/min — ABNORMAL LOW (ref >60)
GLUCOSE: 89 mg/dL (ref 65–99)
PHOSPHORUS: 5.4 mg/dL — AB (ref 2.5–4.6)
POTASSIUM: 4.2 mmol/L (ref 3.5–5.1)
Sodium: 137 mmol/L (ref 135–145)

## 2016-01-16 NOTE — Progress Notes (Signed)
[facility] - [place] at [facility]   PATIENT NAME: [name]    MR#:  [id number]  DATE OF BIRTH:  11-25-1981  SUBJECTIVE:  Patient doing well , face is swollen, scheduled for hemodialysis today. Stopped getting outpatient dialysis for the past to 3 years as he misunderstood his nephrology instructions Has HICKMAN for IV access   REVIEW OF SYSTEMS:    Review of Systems  Constitutional: Negative for fever, chills and malaise/fatigue.  HENT: Negative for sore throat.   Eyes: Negative for blurred vision.  Respiratory: Negative for cough, hemoptysis, shortness of breath and wheezing.   Cardiovascular: Negative for chest pain, palpitations and leg swelling.  Gastrointestinal: Negative for nausea, vomiting, abdominal pain, diarrhea and blood in stool.  Genitourinary: Negative for dysuria.  Musculoskeletal: Negative for back pain.  Neurological: Negative for dizziness, tremors and headaches.  Endo/Heme/Allergies: Does not bruise/bleed easily.    Tolerating Diet:yes      DRUG ALLERGIES:  No Known Allergies  VITALS:  Blood pressure 194/60, pulse 94, temperature 98.1 F (36.7 C), temperature source Oral, resp. rate 16, height  (1.702 m), weight 97.4 kg (214 lb 11.7 oz), SpO2 100 %.  PHYSICAL EXAMINATION:   Physical Exam  Constitutional: He is oriented to person, place, and time and well-developed, well-nourished, and in no distress. No distress.  Generalized edema  HENT:  Head: Normocephalic.  Eyes are swollen  Eyes: No scleral icterus.  Neck: Normal range of motion. Neck supple. No JVD present. No tracheal deviation present.  Cardiovascular: Normal rate, regular rhythm and normal heart sounds.  Exam reveals no gallop and no friction rub.   No murmur heard. Pulmonary/Chest: Effort normal and breath sounds normal. No respiratory distress. He has no wheezes. He has no rales. He exhibits no tenderness.  Abdominal: Soft. Bowel sounds are  normal. He exhibits no distension and no mass. There is no tenderness. There is no rebound and no guarding.  Musculoskeletal: Normal range of motion. He exhibits no edema.  Neurological: He is alert and oriented to person, place, and time.  Skin: Skin is warm. No rash noted. No erythema.  Right arm AV fistula  Psychiatric: Affect and judgment normal.      LABORATORY PANEL:   CBC  Recent Labs Lab 01/16/16 1130  WBC 1.8*  HGB 8.8*  HCT 27.6*  PLT 61*   ------------------------------------------------------------------------------------------------------------------  Chemistries   Recent Labs Lab 01/14/16 0713 01/16/16 1130  NA 139 137  K 4.0 4.2  CL 109 104  CO2 21* 26  GLUCOSE 116* 89  BUN 31* 30*  CREATININE 7.14* 7.51*  CALCIUM 7.7* 7.8*  AST 24  --   ALT 7*  --   ALKPHOS 113  --   BILITOT 0.8  --    ------------------------------------------------------------------------------------------------------------------  Cardiac Enzymes No results for input(s): TROPONINI in the last 168 hours. ------------------------------------------------------------------------------------------------------------------  RADIOLOGY:  No results found.   ASSESSMENT AND PLAN:   [name] is a 35 y.o. black male with HIV/AIDS, ESRD, hypertension, sickle cell disease presented with uremic symptoms  1. ESRD with uremia: Appreciate NEPHROLOGY consult  HD as per nephrology, scheduled for today Seems have last gotten outpatient dialysis in 08/2014 in [facility] followed by Dr. Birdie Riddle He now lives in [place] and needs to establish a HD place in [place], patient has realized that he might need hemodialysis on regular basis from now onwards CM assisting with placement  2. 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 Hypertension: Continue lisinopril and amlodipine   4. HIV: He states his CD$ count is high and is  not taking medications. Appreciate ID recommendations I Dr Sampson Goon has arranged outpatient HIV clinic in [facility] follow-up in 2 weeks Check CD4 and viral load and genotype Last CD4 count was 100 in  2015  5. Chronic pain: Continue pain meds as taking as outpatient   Management plans discussed with the patient and he is in agreement.  CODE STATUS: FULL  TOTAL TIME TAKING CARE OF THIS PATIENT: 30 minutes.     POSSIBLE D/C 2-3 days, DEPENDING ON CLINICAL CONDITION. NEEDS HD center established prior to discharge   Ramonita Lab M.D on 01/16/2016 at 3:04 PM  Between 7am to 6pm - Pager - [phone] After 6pm go to [email or web address] - password [email or web address]  [facility]  Office  [phone]  CC: Primary care physician; Pcp Not In System  Note: This dictation was prepared with Dragon dictation along with smaller phrase technology. Any transcriptional errors that result from this process are unintentional.

## 2016-01-16 NOTE — Progress Notes (Signed)
[facility]  ROUNDING NOTE   Subjective:   Seen and examined on hemodialysis. Tolerating treatment well. UF goal of 2 litres  Objective:  Vital signs in last 24 hours:  Temp:  [97.9 F (36.6 C)-98.3 F (36.8 C)] 97.9 F (36.6 C) (01/28 1125) Pulse Rate:  [89-103] 94 (01/28 0436) Resp:  [16-18] 16 (01/28 0436) BP: (161-194)/(60-91) 194/60 mmHg (01/28 0436) SpO2:  [96 %-100 %] 100 % (01/28 0436) Weight:  [95.618 kg (210 lb 12.8 oz)-97.4 kg (214 lb 11.7 oz)] 97.4 kg (214 lb 11.7 oz) (01/28 1125)  Weight change: 13.971 kg (30 lb 12.8 oz) Filed Weights   01/15/16 0500 01/16/16 0500 01/16/16 1125  Weight: 95.573 kg (210 lb 11.2 oz) 95.618 kg (210 lb 12.8 oz) 97.4 kg (214 lb 11.7 oz)    Intake/Output:     Intake/Output this shift:     Physical Exam: General: NAD, laying in bed  Head: Normocephalic, atraumatic. Moist oral mucosal membranes  Eyes: Anicteric, PERRL  Neck: Left IJ hickman  Lungs:  Clear to auscultation  Heart: Regular rate and rhythm  Abdomen:  Soft, nontender,   Extremities: 1+ peripheral edema.  Neurologic: Nonfocal, moving all four extremities  Skin: No lesions  Access: Right arm AVF, left arm clotted AVG    Basic Metabolic Panel:  Recent Labs Lab 01/13/16 2300 01/14/16 0713  NA 140 139  K 4.1 4.0  CL 106 109  CO2 22 21*  GLUCOSE 95 116*  BUN 29* 31*  CREATININE 7.18* 7.14*  CALCIUM 7.7* 7.7*    Liver Function Tests:  Recent Labs Lab 01/13/16 2300 01/14/16 0713  AST 24 24  ALT 8* 7*  ALKPHOS 120 113  BILITOT 0.5 0.8  PROT 7.0 8.3*  ALBUMIN 2.1* 2.7*    Recent Labs Lab 01/13/16 2310 01/14/16 0713  LIPASE 48 53*   No results for input(s): AMMONIA in the last 168 hours.  CBC:  Recent Labs Lab 01/13/16 2300 01/14/16 0713 01/16/16 1130  WBC 2.6* 2.6* 1.8*  NEUTROABS 1.3* 1.5  --   HGB 9.0* 10.1* 8.8*  HCT 29.5* 31.7* 27.6*  MCV 94.9 94.3 92.1  PLT 115* 97* 61*    Cardiac Enzymes: No results for  input(s): CKTOTAL, CKMB, CKMBINDEX, TROPONINI in the last 168 hours.  BNP: Invalid input(s): POCBNP  CBG: No results for input(s): GLUCAP in the last 168 hours.  Microbiology: Results for orders placed or performed during the hospital encounter of 07/06/14  Blood culture (routine x 2)     Status: None   Collection Time: 07/06/14  4:58 AM  Result Value Ref Range Status   Specimen Description BLOOD RIGHT HAND  Final   Special Requests BOTTLES DRAWN AEROBIC AND ANAEROBIC [facility] EACH  Final   Culture  Setup Time   Final    07/06/2014 18:07 Performed at [facility]   Culture   Final    NO GROWTH 5 DAYS Performed at [facility]   Report Status 07/12/2014 FINAL  Final    Coagulation Studies: No results for input(s): LABPROT, INR in the last 72 hours.  Urinalysis: No results for input(s): COLORURINE, LABSPEC, PHURINE, GLUCOSEU, HGBUR, BILIRUBINUR, KETONESUR, PROTEINUR, UROBILINOGEN, NITRITE, LEUKOCYTESUR in the last 72 hours.  Invalid input(s): APPERANCEUR    Imaging: No results found.   Medications:     . amLODipine  5 mg Oral Daily  . calcitRIOL  0.25 mcg Oral Daily  . calcium acetate  2,001 mg Oral TID WC  . folic  acid  1 mg Oral Daily  . hydroxyurea  500 mg Oral BID  . multivitamin  1 tablet Oral QHS  . sevelamer carbonate  800 mg Oral TID WC  . sodium chloride flush  3 mL Intravenous Q12H   sodium chloride, acetaminophen **OR** acetaminophen, albuterol, bisacodyl, diphenhydrAMINE, diphenhydrAMINE, HYDROmorphone (DILAUDID) injection, HYDROmorphone, [DISCONTINUED] ondansetron **OR** ondansetron (ZOFRAN) IV, ondansetron, polyethylene glycol, sodium chloride flush, sodium phosphate  Assessment/ Plan:  [name] is a 35 y.o. black male with HIV/AIDS, ESRD, hypertension, sickle cell disease  1. ESRD: Tolerating treatment well.  Seems have last gotten outpatient dialysis in 08/2014 in [facility] followed by Dr. Birdie Riddle. However previously  followed in [place], [place] going to [facility]. He now lives in [place] and needs a closer home unit - Dialysis liason aware. Discussed with social work and Herbalist.  - Continue TTS schedule for now.   2. 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 Hypertension: elevated.  - lisinopril and amlodipine  4. Secondary Hyperparathyroidism: hypocalcemia, no phos or PTH available.  - Check phos and PTH - not currently on a binder.    LOS: 2 [name] 1/28/201712:23 PM

## 2016-01-17 LAB — CBC
HCT: 27.9 % — ABNORMAL LOW (ref 40.0–52.0)
Hemoglobin: 8.9 g/dL — ABNORMAL LOW (ref 13.0–18.0)
MCH: 29.5 pg (ref 26.0–34.0)
MCHC: 31.9 g/dL — AB (ref 32.0–36.0)
MCV: 92.4 fL (ref 80.0–100.0)
PLATELETS: 57 10*3/uL — AB (ref 150–440)
RBC: 3.02 MIL/uL — ABNORMAL LOW (ref 4.40–5.90)
RDW: 22.4 % — ABNORMAL HIGH (ref 11.5–14.5)
WBC: 1.5 10*3/uL — ABNORMAL LOW (ref 3.8–10.6)

## 2016-01-17 LAB — PARATHYROID HORMONE, INTACT (NO CA): PTH: 1140 pg/mL — ABNORMAL HIGH (ref 15–65)

## 2016-01-17 NOTE — Progress Notes (Signed)
[facility] - [place] at [facility]   PATIENT NAME: [name]    MR#:  [id number]  DATE OF BIRTH:  Jun 18, 1981  SUBJECTIVE:  Patient doing well , face is less  Swollen, had hemodialysis yeserday. Nauseous. Stopped getting outpatient dialysis for the past to 3 years as he misunderstood his nephrology instructions Has HICKMAN for IV access   REVIEW OF SYSTEMS:    Review of Systems  Constitutional: Negative for fever, chills and malaise/fatigue.  HENT: Negative for sore throat.   Eyes: Negative for blurred vision.  Respiratory: Negative for cough, hemoptysis, shortness of breath and wheezing.   Cardiovascular: Negative for chest pain, palpitations and leg swelling.  Gastrointestinal: Negative for nausea, vomiting, abdominal pain, diarrhea and blood in stool.  Genitourinary: Negative for dysuria.  Musculoskeletal: Negative for back pain.  Neurological: Negative for dizziness, tremors and headaches.  Endo/Heme/Allergies: Does not bruise/bleed easily.    Tolerating Diet:yes      DRUG ALLERGIES:  No Known Allergies  VITALS:  Blood pressure 141/43, pulse 99, temperature 98.7 F (37.1 C), temperature source Oral, resp. rate 18, height  (1.702 m), weight 95.709 kg (211 lb), SpO2 95 %.  PHYSICAL EXAMINATION:   Physical Exam  Constitutional: He is oriented to person, place, and time and well-developed, well-nourished, and in no distress. No distress.  Generalized edema  HENT:  Head: Normocephalic.  Eyes are swollen  Eyes: No scleral icterus.  Neck: Normal range of motion. Neck supple. No JVD present. No tracheal deviation present.  Cardiovascular: Normal rate, regular rhythm and normal heart sounds.  Exam reveals no gallop and no friction rub.   No murmur heard. Pulmonary/Chest: Effort normal and breath sounds normal. No respiratory distress. He has no wheezes. He has no rales. He exhibits no tenderness.  Abdominal: Soft. Bowel sounds are  normal. He exhibits no distension and no mass. There is no tenderness. There is no rebound and no guarding.  Musculoskeletal: Normal range of motion. He exhibits no edema.  Neurological: He is alert and oriented to person, place, and time.  Skin: Skin is warm. No rash noted. No erythema.  Right arm AV fistula  Psychiatric: Affect and judgment normal.      LABORATORY PANEL:   CBC  Recent Labs Lab 01/17/16 0445  WBC 1.5*  HGB 8.9*  HCT 27.9*  PLT 57*   ------------------------------------------------------------------------------------------------------------------  Chemistries   Recent Labs Lab 01/14/16 0713 01/16/16 1130  NA 139 137  K 4.0 4.2  CL 109 104  CO2 21* 26  GLUCOSE 116* 89  BUN 31* 30*  CREATININE 7.14* 7.51*  CALCIUM 7.7* 7.8*  AST 24  --   ALT 7*  --   ALKPHOS 113  --   BILITOT 0.8  --    ------------------------------------------------------------------------------------------------------------------  Cardiac Enzymes No results for input(s): TROPONINI in the last 168 hours. ------------------------------------------------------------------------------------------------------------------  RADIOLOGY:  No results found.   ASSESSMENT AND PLAN:   [name] is a 35 y.o. black male with HIV/AIDS, ESRD, hypertension, sickle cell disease presented with uremic symptoms  1. ESRD with uremia: Appreciate NEPHROLOGY consult  HD as per nephrology, scheduled for TTS Nausea prob 2/2 uremia Seems have last gotten outpatient dialysis in 08/2014 in [facility] followed by Dr. Birdie Riddle He now lives in [place] and needs to establish a HD place in [place], patient has realized that he might need hemodialysis on regular basis from now onwards CM assisting with placement, HD chair TTS  2. 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 Hypertension: Continue lisinopril and amlodipine   4. HIV: He  states his CD$ count is high and is not taking medications. Appreciate ID recommendations I Dr Sampson Goon has arranged outpatient HIV clinic in [facility] follow-up in 2 weeks Check CD4 and viral load and genotype Last CD4 count was 100 in  2015  5. Chronic pain: Continue pain meds as taking as outpatient  6 Leukopenia - prob adverse effect of hydrea - d/ced , check cbc am  Will consult oncology if needed , pt is not septic   Management plans discussed with the patient and he is in agreement.  CODE STATUS: FULL  TOTAL TIME TAKING CARE OF THIS PATIENT: 35 minutes.     POSSIBLE D/C 2-3 days, DEPENDING ON CLINICAL CONDITION. NEEDS HD center established prior to discharge   Ramonita Lab M.D on 01/17/2016 at 6:44 PM  Between 7am to 6pm - Pager - [phone] After 6pm go to [email or web address] - password [email or web address]  [facility]  Office  [phone]  CC: Primary care physician; Pcp Not In System  Note: This dictation was prepared with Dragon dictation along with smaller phrase technology. Any transcriptional errors that result from this process are unintentional.

## 2016-01-17 NOTE — Progress Notes (Signed)
[facility]  ROUNDING NOTE   Subjective:   Hemodialysis treatment yesterday. Tolerated treatment well. UF of 2 litres  Complains of nausea this morning.   Objective:  Vital signs in last 24 hours:  Temp:  [98.1 F (36.7 C)-98.8 F (37.1 C)] 98.3 F (36.8 C) (01/29 0508) Pulse Rate:  [86-98] 86 (01/29 0508) Resp:  [16-17] 17 (01/29 0508) BP: (143-158)/(63-79) 143/63 mmHg (01/29 0508) SpO2:  [92 %-97 %] 97 % (01/29 0508) Weight:  [95.709 kg (211 lb)] 95.709 kg (211 lb) (01/29 0500)  Weight change: 1.782 kg (3 lb 14.9 oz) Filed Weights   01/16/16 0500 01/16/16 1125 01/17/16 0500  Weight: 95.618 kg (210 lb 12.8 oz) 97.4 kg (214 lb 11.7 oz) 95.709 kg (211 lb)    Intake/Output: I/O last 3 completed shifts: In: 480 [P.O.:480] Out: 2000 [Other:2000]   Intake/Output this shift:  Total I/O In: 10 [I.V.:10] Out: -   Physical Exam: General: NAD, laying in bed  Head: Normocephalic, atraumatic. Moist oral mucosal membranes  Eyes: Anicteric, PERRL  Neck: Left IJ hickman  Lungs:  Clear to auscultation  Heart: Regular rate and rhythm  Abdomen:  Soft, nontender,   Extremities: trace peripheral edema.  Neurologic: Nonfocal, moving all four extremities  Skin: No lesions  Access: Right arm AVF, left arm clotted AVG    Basic Metabolic Panel:  Recent Labs Lab 01/13/16 2300 01/14/16 0713 01/16/16 1130  NA 140 139 137  K 4.1 4.0 4.2  CL 106 109 104  CO2 22 21* 26  GLUCOSE 95 116* 89  BUN 29* 31* 30*  CREATININE 7.18* 7.14* 7.51*  CALCIUM 7.7* 7.7* 7.8*  PHOS  --   --  5.4*  5.4*    Liver Function Tests:  Recent Labs Lab 01/13/16 2300 01/14/16 0713 01/16/16 1130  AST 24 24  --   ALT 8* 7*  --   ALKPHOS 120 113  --   BILITOT 0.5 0.8  --   PROT 7.0 8.3*  --   ALBUMIN 2.1* 2.7* 2.2*    Recent Labs Lab 01/13/16 2310 01/14/16 0713  LIPASE 48 53*   No results for input(s): AMMONIA in the last 168 hours.  CBC:  Recent Labs Lab  01/13/16 2300 01/14/16 0713 01/16/16 1130 01/17/16 0445  WBC 2.6* 2.6* 1.8* 1.5*  NEUTROABS 1.3* 1.5  --   --   HGB 9.0* 10.1* 8.8* 8.9*  HCT 29.5* 31.7* 27.6* 27.9*  MCV 94.9 94.3 92.1 92.4  PLT 115* 97* 61* 57*    Cardiac Enzymes: No results for input(s): CKTOTAL, CKMB, CKMBINDEX, TROPONINI in the last 168 hours.  BNP: Invalid input(s): POCBNP  CBG: No results for input(s): GLUCAP in the last 168 hours.  Microbiology: Results for orders placed or performed during the hospital encounter of 07/06/14  Blood culture (routine x 2)     Status: None   Collection Time: 07/06/14  4:58 AM  Result Value Ref Range Status   Specimen Description BLOOD RIGHT HAND  Final   Special Requests BOTTLES DRAWN AEROBIC AND ANAEROBIC [facility] EACH  Final   Culture  Setup Time   Final    07/06/2014 18:07 Performed at [facility]   Culture   Final    NO GROWTH 5 DAYS Performed at [facility]   Report Status 07/12/2014 FINAL  Final    Coagulation Studies: No results for input(s): LABPROT, INR in the last 72 hours.  Urinalysis: No results for input(s): COLORURINE, LABSPEC, PHURINE, GLUCOSEU, HGBUR,  BILIRUBINUR, KETONESUR, PROTEINUR, UROBILINOGEN, NITRITE, LEUKOCYTESUR in the last 72 hours.  Invalid input(s): APPERANCEUR    Imaging: No results found.   Medications:     . amLODipine  5 mg Oral Daily  . calcitRIOL  0.25 mcg Oral Daily  . calcium acetate  2,001 mg Oral TID WC  . folic acid  1 mg Oral Daily  . hydroxyurea  500 mg Oral BID  . multivitamin  1 tablet Oral QHS  . sevelamer carbonate  800 mg Oral TID WC  . sodium chloride flush  3 mL Intravenous Q12H   sodium chloride, acetaminophen **OR** acetaminophen, albuterol, bisacodyl, diphenhydrAMINE, diphenhydrAMINE, HYDROmorphone (DILAUDID) injection, HYDROmorphone, [DISCONTINUED] ondansetron **OR** ondansetron (ZOFRAN) IV, ondansetron, polyethylene glycol, sodium chloride flush, sodium phosphate  Assessment/ Plan:   Mr. Arran Fessel is a 35 y.o. black male with HIV/AIDS, ESRD, hypertension, sickle cell disease  1. ESRD: Tolerating treatment well.  Seems have last gotten outpatient dialysis in 08/2014 in [facility] followed by Dr. Birdie Riddle. However previously followed in [place], [place] going to [facility]. He now lives in [place] and needs a closer home unit - Dialysis liason aware. Discussed with social work and Herbalist.  - Continue TTS schedule for now. Next treatment for Tuesday.    2. Anemia of chronic kidney disease with sickle cell disease: Hemoglobin 8.9 - epo with treatment  3. Hypertension: blood pressure now at goal.  - lisinopril and amlodipine  4. Secondary Hyperparathyroidism:  Phosphorus 5.4, calcium corrected at goal. Pending PTH - not currently on a binder.    LOS: 3 Narjis Mira 1/29/201712:18 PM

## 2016-01-18 LAB — BASIC METABOLIC PANEL
ANION GAP: 7 (ref 5–15)
BUN: 33 mg/dL — AB (ref 6–20)
CALCIUM: 7.7 mg/dL — AB (ref 8.9–10.3)
CO2: 26 mmol/L (ref 22–32)
CREATININE: 7.07 mg/dL — AB (ref 0.61–1.24)
Chloride: 101 mmol/L (ref 101–111)
GFR calc Af Amer: 10 mL/min — ABNORMAL LOW (ref >60)
GFR, EST NON AFRICAN AMERICAN: 9 mL/min — AB (ref >60)
Glucose, Bld: 112 mg/dL — ABNORMAL HIGH (ref 65–99)
POTASSIUM: 4.3 mmol/L (ref 3.5–5.1)
SODIUM: 134 mmol/L — AB (ref 135–145)

## 2016-01-18 LAB — CBC
HCT: 27.9 % — ABNORMAL LOW (ref 40.0–52.0)
Hemoglobin: 8.9 g/dL — ABNORMAL LOW (ref 13.0–18.0)
MCH: 29.4 pg (ref 26.0–34.0)
MCHC: 31.7 g/dL — ABNORMAL LOW (ref 32.0–36.0)
MCV: 92.6 fL (ref 80.0–100.0)
PLATELETS: 51 10*3/uL — AB (ref 150–440)
RBC: 3.02 MIL/uL — AB (ref 4.40–5.90)
RDW: 21.4 % — AB (ref 11.5–14.5)
WBC: 1.8 10*3/uL — AB (ref 3.8–10.6)

## 2016-01-18 LAB — HEMOGLOBINOPATHY EVALUATION
HGB C: 0 %
HGB F QUANT: 3.7 % — AB (ref 0.0–2.0)
HGB S QUANTITAION: 0 %
Hgb A2 Quant: 2.4 % (ref 0.7–3.1)
Hgb A: 93.9 % — ABNORMAL LOW (ref 94.0–98.0)

## 2016-01-18 MED ORDER — AMLODIPINE BESYLATE 10 MG PO TABS
10.0000 mg | ORAL_TABLET | Freq: Every day | ORAL | Status: DC
  Administered 2016-01-19 – 2016-01-22 (×3): 10 mg via ORAL
  Filled 2016-01-18 (×3): qty 1

## 2016-01-18 MED ORDER — LISINOPRIL 10 MG PO TABS
10.0000 mg | ORAL_TABLET | Freq: Every day | ORAL | Status: DC
  Administered 2016-01-18 – 2016-01-22 (×4): 10 mg via ORAL
  Filled 2016-01-18 (×4): qty 1

## 2016-01-18 NOTE — Progress Notes (Signed)
[facility] - [place] [facility]   PATIENT NAME: [name]    MR#:  [id number]  DATE OF BIRTH:  July 02, 1981  SUBJECTIVE:  Patient doing well , had hemodialysis Saturday. Nauseous still . Patient takes Dilaudid 8 mg by mouth every 4 hours as needed for chronic pain, not seeing any pain management doctors. Has HICKMAN for IV access   REVIEW OF SYSTEMS:    Review of Systems  Constitutional: Negative for fever, chills and malaise/fatigue.  HENT: Negative for sore throat.   Eyes: Negative for blurred vision.  Respiratory: Negative for cough, hemoptysis, shortness of breath and wheezing.   Cardiovascular: Negative for chest pain, palpitations and leg swelling.  Gastrointestinal: Negative for nausea, vomiting, abdominal pain, diarrhea and blood in stool.  Genitourinary: Negative for dysuria.  Musculoskeletal: Negative for back pain.  Neurological: Negative for dizziness, tremors and headaches.  Endo/Heme/Allergies: Does not bruise/bleed easily.    Tolerating Diet:yes      DRUG ALLERGIES:  No Known Allergies  VITALS:  Blood pressure 178/86, pulse 102, temperature 98.8 F (37.1 C), temperature source Oral, resp. rate 18, height  (1.702 m), weight 95.8 kg (211 lb 3.2 oz), SpO2 94 %.  PHYSICAL EXAMINATION:   Physical Exam  Constitutional: He is oriented to person, place, and time and well-developed, well-nourished, and in no distress. No distress.  Generalized edema  HENT:  Head: Normocephalic.  Eyes are swollen  Eyes: No scleral icterus.  Neck: Normal range of motion. Neck supple. No JVD present. No tracheal deviation present.  Cardiovascular: Normal rate, regular rhythm and normal heart sounds.  Exam reveals no gallop and no friction rub.   No murmur heard. Pulmonary/Chest: Effort normal and breath sounds normal. No respiratory distress. He has no wheezes. He has no rales. He exhibits no tenderness.  Abdominal: Soft. Bowel sounds are  normal. He exhibits no distension and no mass. There is no tenderness. There is no rebound and no guarding.  Musculoskeletal: Normal range of motion. He exhibits no edema.  Neurological: He is alert and oriented to person, place, and time.  Skin: Skin is warm. No rash noted. No erythema.  Right arm AV fistula  Psychiatric: Affect and judgment normal.      LABORATORY PANEL:   CBC  Recent Labs Lab 01/18/16 0500  WBC 1.8*  HGB 8.9*  HCT 27.9*  PLT 51*   ------------------------------------------------------------------------------------------------------------------  Chemistries   Recent Labs Lab 01/14/16 0713  01/18/16 0500  NA 139  < > 134*  K 4.0  < > 4.3  CL 109  < > 101  CO2 21*  < > 26  GLUCOSE 116*  < > 112*  BUN 31*  < > 33*  CREATININE 7.14*  < > 7.07*  CALCIUM 7.7*  < > 7.7*  AST 24  --   --   ALT 7*  --   --   ALKPHOS 113  --   --   BILITOT 0.8  --   --   < > = values in this interval not displayed. ------------------------------------------------------------------------------------------------------------------  Cardiac Enzymes No results for input(s): TROPONINI in the last 168 hours. ------------------------------------------------------------------------------------------------------------------  RADIOLOGY:  No results found.   ASSESSMENT AND PLAN:   [name] is a 35 y.o. black male with HIV/AIDS, ESRD, hypertension, sickle cell disease presented with uremic symptoms  1. ESRD with uremia:   HD as per nephrology, scheduled for TTS Nausea prob 2/2 uremia Seems have last gotten outpatient dialysis in  08/2014 in [facility] followed by Dr. Birdie Riddle He now lives in [place] and needs to establish a HD place in [place], [place] assisting with placement, HD chair TTS  2. 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  Hypertension: Blood pressure is elevated  amlodipine dose increased to 10 mg by mouth once daily  patient's home medication lisinopril 10 mg is added to the regimen   4. HIV: He states his CD$ count is high and is not taking medications. Appreciate ID recommendations Dr Sampson Goon has arranged outpatient HIV clinic in [facility] follow-up in 2 weeks Check CD4 and viral load and genotype Last CD4 count was 100 in  2015  5. Chronic pain: Continue pain meds as taking as outpatient  6 Leukopenia - prob adverse effect of hydrea - d/ced , check cbc am   consult oncology if needed , pt is not septic  7. Chronic pain disorder Patient takes Dilaudid 8 mg by mouth every 4 hours as needed for pain Consult is placed to pain management   Management plans discussed with the patient and he is in agreement.  CODE STATUS: FULL  TOTAL TIME TAKING CARE OF THIS PATIENT: 35 minutes.     POSSIBLE D/C 2-3 days, DEPENDING ON CLINICAL CONDITION. NEEDS HD center established prior to discharge   Ramonita Lab M.D on 01/18/2016 at 3:13 PM  Between 7am to 6pm - Pager - [phone] After 6pm go to [email or web address] - password [email or web address]  [facility]  Office  [phone]  CC: Primary care physician; Pcp Not In System  Note: This dictation was prepared with Dragon dictation along with smaller phrase technology. Any transcriptional errors that result from this process are unintentional.

## 2016-01-18 NOTE — Care Management Important Message (Signed)
Important Message  Patient Details  Name: [name] MRN: [id number] Date of Birth: [birth year]   Medicare Important Message Given:  Yes    Ilai Hiller A, RN 01/18/2016, 8:11 AM

## 2016-01-18 NOTE — Care Management Note (Addendum)
Patient dialysis referral has been sent to [facility]  Admission intake for placement at [facility] [address]   I need the following medical records too complete admission information list for [facility] I need the following EKG, PPD/Chest xray.  Admission should have easiest acces at retrieving patients [id number] from [facility] Required tracking information to follow patient as the move from one clinic to another). [facility] is the company he would have treated with in [facility].    Ivor Reining Dialysis Liaison  [phone]

## 2016-01-18 NOTE — Progress Notes (Signed)
[facility]  ROUNDING NOTE   Subjective:   No acute c/o Laying in bed Denies SOB   Objective:  Vital signs in last 24 hours:  Temp:  [98 F (36.7 C)-98.8 F (37.1 C)] 98.8 F (37.1 C) (01/30 1227) Pulse Rate:  [96-102] 102 (01/30 1227) Resp:  [17-18] 18 (01/30 1227) BP: (141-188)/(43-86) 178/86 mmHg (01/30 1227) SpO2:  [94 %-95 %] 94 % (01/30 1227) Weight:  [95.8 kg (211 lb 3.2 oz)] 95.8 kg (211 lb 3.2 oz) (01/30 0500)  Weight change: -1.6 kg (-3 lb 8.4 oz) Filed Weights   01/16/16 1125 01/17/16 0500 01/18/16 0500  Weight: 97.4 kg (214 lb 11.7 oz) 95.709 kg (211 lb) 95.8 kg (211 lb 3.2 oz)    Intake/Output: I/O last 3 completed shifts: In: 970 [P.O.:960; I.V.:10] Out: -    Intake/Output this shift:     Physical Exam: General: NAD, laying in bed  Head: Normocephalic, atraumatic. Moist oral mucosal membranes  Eyes: Anicteric,  Neck: Left IJ hickman  Lungs:  Clear to auscultation  Heart: Regular rate and rhythm  Abdomen:  Soft, nontender,   Extremities: trace peripheral edema.  Neurologic: Nonfocal, moving all four extremities  Skin: No lesions  Access: Right arm AVF, left arm clotted AVG    Basic Metabolic Panel:  Recent Labs Lab 01/13/16 2300 01/14/16 0713 01/16/16 1130 01/18/16 0500  NA 140 139 137 134*  K 4.1 4.0 4.2 4.3  CL 106 109 104 101  CO2 22 21* 26 26  GLUCOSE 95 116* 89 112*  BUN 29* 31* 30* 33*  CREATININE 7.18* 7.14* 7.51* 7.07*  CALCIUM 7.7* 7.7* 7.8* 7.7*  PHOS  --   --  5.4*  5.4*  --     Liver Function Tests:  Recent Labs Lab 01/13/16 2300 01/14/16 0713 01/16/16 1130  AST 24 24  --   ALT 8* 7*  --   ALKPHOS 120 113  --   BILITOT 0.5 0.8  --   PROT 7.0 8.3*  --   ALBUMIN 2.1* 2.7* 2.2*    Recent Labs Lab 01/13/16 2310 01/14/16 0713  LIPASE 48 53*   No results for input(s): AMMONIA in the last 168 hours.  CBC:  Recent Labs Lab 01/13/16 2300 01/14/16 0713 01/16/16 1130 01/17/16 0445  01/18/16 0500  WBC 2.6* 2.6* 1.8* 1.5* 1.8*  NEUTROABS 1.3* 1.5  --   --   --   HGB 9.0* 10.1* 8.8* 8.9* 8.9*  HCT 29.5* 31.7* 27.6* 27.9* 27.9*  MCV 94.9 94.3 92.1 92.4 92.6  PLT 115* 97* 61* 57* 51*    Cardiac Enzymes: No results for input(s): CKTOTAL, CKMB, CKMBINDEX, TROPONINI in the last 168 hours.  BNP: Invalid input(s): POCBNP  CBG: No results for input(s): GLUCAP in the last 168 hours.  Microbiology: Results for orders placed or performed during the hospital encounter of 07/06/14  Blood culture (routine x 2)     Status: None   Collection Time: 07/06/14  4:58 AM  Result Value Ref Range Status   Specimen Description BLOOD RIGHT HAND  Final   Special Requests BOTTLES DRAWN AEROBIC AND ANAEROBIC [facility] EACH  Final   Culture  Setup Time   Final    07/06/2014 18:07 Performed at [facility]   Culture   Final    NO GROWTH 5 DAYS Performed at [facility]   Report Status 07/12/2014 FINAL  Final    Coagulation Studies: No results for input(s): LABPROT, INR in the last  72 hours.  Urinalysis: No results for input(s): COLORURINE, LABSPEC, PHURINE, GLUCOSEU, HGBUR, BILIRUBINUR, KETONESUR, PROTEINUR, UROBILINOGEN, NITRITE, LEUKOCYTESUR in the last 72 hours.  Invalid input(s): APPERANCEUR    Imaging: No results found.   Medications:     . amLODipine  5 mg Oral Daily  . calcitRIOL  0.25 mcg Oral Daily  . calcium acetate  2,001 mg Oral TID WC  . folic acid  1 mg Oral Daily  . multivitamin  1 tablet Oral QHS  . sevelamer carbonate  800 mg Oral TID WC  . sodium chloride flush  3 mL Intravenous Q12H   sodium chloride, acetaminophen **OR** acetaminophen, albuterol, bisacodyl, diphenhydrAMINE, diphenhydrAMINE, HYDROmorphone (DILAUDID) injection, HYDROmorphone, [DISCONTINUED] ondansetron **OR** ondansetron (ZOFRAN) IV, ondansetron, polyethylene glycol, sodium chloride flush  Assessment/ Plan:  [name] is a 35 y.o. black male with  HIV/AIDS, ESRD, hypertension, sickle cell disease  1. ESRD: Tolerating treatment well.  Seems have last gotten outpatient dialysis in 08/2014 in [facility] followed by Dr. Birdie Frank. However previously followed in [place], [place] going to [facility]. Patient states he was incarcerated in [place]  - may need records related to that prior to outpatient placement - He now wants to live in [place] with his room mate - Dialysis liason aware. Discussed with social work and Herbalist.  - Continue TTS schedule for now. Next treatment for Tuesday.    2. Anemia of chronic kidney disease with sickle cell disease: Hemoglobin 8.9 - epo with treatment  3. Hypertension: blood pressure now at goal.  - lisinopril and amlodipine  4. Secondary Hyperparathyroidism:  Phosphorus 5.4  - not currently on a binder.    LOS: 4 [name] 1/30/201712:48 PM

## 2016-01-18 NOTE — Consult Note (Addendum)
Patient's Name: [name] MRN: [id number] DOB: [birth year] DOS: January 18, 2016 Admission Date: 01/14/2016 Attending Provider: Nicholes Mango, MD  Reason(s) for Evaluation: Chronic Pain Management Hospital Consult CC: Sickle Cell Pain Crisis   HPI:  Mr. Ploeger is a 35 y.o. year old, male patient, who returns today as an established patient. He has Pulmonary mass; HTN (hypertension); End stage renal disease on dialysis [facility]); HIV (human immunodeficiency virus infection) ([place]); Sickle cell anemia (HCC); DM (diabetes mellitus) ([place]); Pancreatitis; Hyperkalemia; Hyponatremia; Hypochloremia; Pancreatitis, acute; ESRD on dialysis [facility]); Sickle cell trait ([place]); DM (diabetes mellitus) ([place]); HTN (hypertension); HIV (human immunodeficiency virus infection) ([place]); Noncompliance of patient with renal dialysis ([place]); History of noncompliance with medical treatment; Acute chest syndrome ([place]); Acute chest pain; Sickle cell anemia with pain ([place]); Sickle cell pain crisis ([place]); Sickle cell crisis ([place]); ESRD (end stage renal disease) on dialysis ([place]); AIDS (acquired immune deficiency syndrome) ([place]); and Uremia on his problem list.. His primarily concern today is the Sickle Cell Pain Crisis   The patient is consulted for pain management due to a sickle cell pain crisis. The patient indicates that his primary pain is in all of his joints with the right hip will be in the worse since he has a prior history of avascular necrosis of the right hip. Following this, the knee pain is to work with the right being just as bad as the left. His third worst pain as the lower back with the right side being worst on the left. He indicates that this is secondary to his sickle cell crisis. He indicates that when he has a flareup he usually last 7 days. In addition, he indicates having 7-8 episodes per year and he describes this one has been the first of this year. He is well aware of his condition, which he has had  since he was a child. Recently he has been having more problems due to the kidney failure that apparently is triggering some of the crises.  The patient denies having any pain physicians and apparently he is not to the area since he comes from [place]. [place] he was recently incarcerated but he indicated that when he did get to pain medicines it was his hematologist that was prescribing it.  He indicates that for the crises he normally will use 4 mg every 4 hours IV when necessary for the pain. He indicates that he is currently receiving 2 mg IV every 4 hours but this seems to be doing okay. He is pending to have dialysis tomorrow after which he has been told that they may start him on fentanyl. The problem with this particular choice is that his pain is not constant. Therefore this Duragesic patch should be discontinued before he leaves the hospital.   Reported Pain Score: 2 , clinically he looks like a 3-4/10. Reported level is inconsistent with clinical obrservations. Pain Type: Acute pain Pain Location: Generalized Pain Orientation:  (Joint pain) Pain Descriptors / Indicators: Aching Pain Onset: On-going Pain Frequency: Intermittent   Chronic Pain Management Pharmacotherapy  Review: IV Dilaudid 2 mg every 4 hours when necessary for pain. Being provided to the patient by the admitting physician while hospitalized. Onset of action: Within expected pharmacological parameters Time to Peak effect: Timing and results are as within normal expected parameters Effectiveness: Described as relatively effective, allowing for increase in activities of daily living (ADL) % Relief: More than 50% Side-effects or Adverse reactions: None reported Duration of action: Within normal limits for  medication  Lab Work  Illicit Drugs No results found for: THCU, COCAINSCRNUR, PCPSCRNUR, MDMA, AMPHETMU, METHADONE, ETOH  Inflammation Markers Lab Results  Component Value Date   ESRSEDRATE > 140*  05/21/2014    Renal Function Lab Results  Component Value Date   BUN 33* 01/18/2016   CREATININE 7.07* 01/18/2016   GFRAA 10* 01/18/2016   GFRNONAA 9* 01/18/2016    Hepatic Function Lab Results  Component Value Date   AST 24 01/14/2016   ALT 7* 01/14/2016   ALBUMIN 2.2* 01/16/2016    Electrolytes Lab Results  Component Value Date   NA 134* 01/18/2016   K 4.3 01/18/2016   CL 101 01/18/2016   CALCIUM 7.7* 01/18/2016   MG 2.3 11/16/2014    Last 72 hours Results for orders placed or performed during the hospital encounter of 01/14/16 (from the past 72 hour(s))  Renal function panel     Status: Abnormal   Collection Time: 01/16/16 11:30 AM  Result Value Ref Range   Sodium 137 135 - 145 mmol/L   Potassium 4.2 3.5 - 5.1 mmol/L   Chloride 104 101 - 111 mmol/L   CO2 26 22 - 32 mmol/L   Glucose, Bld 89 65 - 99 mg/dL   BUN 30 (H) 6 - 20 mg/dL   Creatinine, Ser 7.51 (H) 0.61 - 1.24 mg/dL   Calcium 7.8 (L) 8.9 - 10.3 mg/dL   Phosphorus 5.4 (H) 2.5 - 4.6 mg/dL   Albumin 2.2 (L) 3.5 - 5.0 g/dL   GFR calc non Af Amer 8 (L) >60 mL/min   GFR calc Af Amer 10 (L) >60 mL/min    Comment: (NOTE) The eGFR has been calculated using the CKD EPI equation. This calculation has not been validated in all clinical situations. eGFR's persistently <60 mL/min signify possible Chronic Kidney Disease.    Anion gap 7 5 - 15  CBC     Status: Abnormal   Collection Time: 01/16/16 11:30 AM  Result Value Ref Range   WBC 1.8 (L) 3.8 - 10.6 K/uL   RBC 3.00 (L) 4.40 - 5.90 MIL/uL   Hemoglobin 8.8 (L) 13.0 - 18.0 g/dL   HCT 27.6 (L) 40.0 - 52.0 %   MCV 92.1 80.0 - 100.0 fL   MCH 29.4 26.0 - 34.0 pg   MCHC 31.9 (L) 32.0 - 36.0 g/dL   RDW 21.8 (H) 11.5 - 14.5 %   Platelets 61 (L) 150 - 440 K/uL  Parathyroid hormone, intact (no Ca)     Status: Abnormal   Collection Time: 01/16/16 11:30 AM  Result Value Ref Range   PTH 1140 (H) 15 - 65 pg/mL    Comment: (NOTE) Performed At: [facility] [place], [place] [id number] Lindon Romp MD [id number]   Phosphorus     Status: Abnormal   Collection Time: 01/16/16 11:30 AM  Result Value Ref Range   Phosphorus 5.4 (H) 2.5 - 4.6 mg/dL  CBC     Status: Abnormal   Collection Time: 01/17/16  4:45 AM  Result Value Ref Range   WBC 1.5 (L) 3.8 - 10.6 K/uL    Comment: RESULT REPEATED AND VERIFIED   RBC 3.02 (L) 4.40 - 5.90 MIL/uL   Hemoglobin 8.9 (L) 13.0 - 18.0 g/dL   HCT 27.9 (L) 40.0 - 52.0 %   MCV 92.4 80.0 - 100.0 fL   MCH 29.5 26.0 - 34.0 pg   MCHC 31.9 (L) 32.0 - 36.0 g/dL   RDW 22.4 (  H) 11.5 - 14.5 %   Platelets 57 (L) 150 - 440 K/uL  CBC     Status: Abnormal   Collection Time: 01/18/16  5:00 AM  Result Value Ref Range   WBC 1.8 (L) 3.8 - 10.6 K/uL   RBC 3.02 (L) 4.40 - 5.90 MIL/uL   Hemoglobin 8.9 (L) 13.0 - 18.0 g/dL   HCT 27.9 (L) 40.0 - 52.0 %   MCV 92.6 80.0 - 100.0 fL   MCH 29.4 26.0 - 34.0 pg   MCHC 31.7 (L) 32.0 - 36.0 g/dL   RDW 21.4 (H) 11.5 - 14.5 %   Platelets 51 (L) 150 - 440 K/uL  Basic metabolic panel     Status: Abnormal   Collection Time: 01/18/16  5:00 AM  Result Value Ref Range   Sodium 134 (L) 135 - 145 mmol/L   Potassium 4.3 3.5 - 5.1 mmol/L   Chloride 101 101 - 111 mmol/L   CO2 26 22 - 32 mmol/L   Glucose, Bld 112 (H) 65 - 99 mg/dL   BUN 33 (H) 6 - 20 mg/dL   Creatinine, Ser 7.07 (H) 0.61 - 1.24 mg/dL   Calcium 7.7 (L) 8.9 - 10.3 mg/dL   GFR calc non Af Amer 9 (L) >60 mL/min   GFR calc Af Amer 10 (L) >60 mL/min    Comment: (NOTE) The eGFR has been calculated using the CKD EPI equation. This calculation has not been validated in all clinical situations. eGFR's persistently <60 mL/min signify possible Chronic Kidney Disease.    Anion gap 7 5 - 15    Allergies  Mr. Cygan has No Known Allergies.  Meds  Scheduled Medications: . [START ON 01/19/2016] amLODipine  10 mg Oral Daily  . calcitRIOL  0.25 mcg Oral Daily  . calcium acetate  2,001 mg Oral TID  WC  . folic acid  1 mg Oral Daily  . lisinopril  10 mg Oral Daily  . multivitamin  1 tablet Oral QHS  . sevelamer carbonate  800 mg Oral TID WC  . sodium chloride flush  3 mL Intravenous Q12H    PRN Medications: sodium chloride, acetaminophen **OR** acetaminophen, albuterol, bisacodyl, diphenhydrAMINE, diphenhydrAMINE, HYDROmorphone (DILAUDID) injection, HYDROmorphone, [DISCONTINUED] ondansetron **OR** ondansetron (ZOFRAN) IV, ondansetron, polyethylene glycol, sodium chloride flush  Discontinued Medications: Medications Discontinued During This Encounter  Medication Reason  . hydroxyurea (HYDREA) 749 MG capsule Duplicate  . ondansetron (ZOFRAN) tablet 4 mg   . lisinopril (PRINIVIL,ZESTRIL) tablet 10 mg   . hydroxyurea (HYDREA) capsule 500 mg   . sodium phosphate (FLEET) 7-19 GM/118ML enema 1 enema   . amLODipine (NORVASC) tablet 5 mg     PFSH  Medical:  Mr. Rucinski  has a past medical history of Sickle cell anemia ([place]); ESRD on hemodialysis ([place]); HIV disease ([place]); Drug-seeking behavior; Sickle cell anemia ([place]); HIV (human immunodeficiency virus infection) ([place]) (2003); HTN (hypertension); Pulmonary mass; Pancreatitis (2012); Hypertension; Blood transfusion; End stage renal disease on dialysis ([place]) (HD in 2009); ESRD (end stage renal disease) on dialysis [facility]); and Renal failure. Family: family history includes Cervical cancer in his mother; Hypertension in his father. Surgical:  has past surgical history that includes Hip surgery; Dialysis fistula creation (Left); Hip fracture surgery (Right); and AV fistula placement (Left). Tobacco:  reports that he has been smoking Cigarettes.  He has a 2.16 pack-year smoking history. He has never used smokeless tobacco. Alcohol:  reports that he does not drink alcohol. Drug:  reports that he does not use  illicit drugs.  Physical Exam  Vitals:  Today's Vitals   01/18/16 1219 01/18/16 1227 01/18/16 1602 01/18/16 1632  BP:  178/86     Pulse:  102    Temp:  98.8 F (37.1 C)    TempSrc:  Oral    Resp:  18    Height:      Weight:      SpO2:  94%    PainSc: Asleep  7  2     Calculated BMI: Body mass index is 33.07 kg/(m^2).  General appearance: alert, cooperative, appears older than stated age, no distress and mildly obese Eyes: PERLA Respiratory: No evidence respiratory distress, no audible rales or ronchi and no use of accessory muscles of respiration  Cervical Spine Inspection: Normal anatomy Alignment: Symetrical Palpation: WNL ROM: Adequate  Upper Extremities Inspection: No gross anomalies detected ROM: Adequate Sensory: Normal Motor: Grossly normal  Thoracic Spine Inspection: No gross anomalies detected Alignment: Symetrical Palpation: WNL ROM: Adequate  Lumbar Spine Inspection: No gross anomalies detected Alignment: Symetrical ROM: Decreased due to guarding Gait: The patient is recumbent in his hospital bed.  Lower Extremities Inspection: No gross anomalies detected ROM: Adequate Sensory: WNL Motor: Grossly normal  Assessment  Pain Problem List: Mr. Coltrane has Sickle cell pain crisis ([place]); ESRD (end stage renal disease) on dialysis ([place]); and AIDS (acquired immune deficiency syndrome) ([place]) on his pertinent problem list.  Visit Diagnosis: The primary encounter diagnosis was Chronic kidney disease, unspecified stage. A diagnosis of Hb-SS disease with crisis [facility]) was also pertinent to this visit.  Recommendations / Plan   Interventional Pain Management Recommendations: None at this point due to the extent of the places where he is experiencing pain. (Generalized arthralgias)    Pharmacological Recommendations: Most of these patients with sickle cell disease have a long-standing history of hospital admissions secondary to sickle cell crises associated with pain. More likely than not, many alternatives have been tried and it would seem that the IV Dilaudid 2-4 mg every 4 hours does  work well for him. Because of the nausea and vomiting he is not a good candidate to receive the medications by mouth. Due to the patient's renal failure, the best opioid choice would be the fentanyl. However, using a patch on a pain that usually  lasts 7 days, may not be optimal. With Sickle cell patients, the best strategy is to treat the crisis with IV medications so as to avoid sending the patient home on any pain medication. Starting the patient on oral medications or patches, could trigger the patient to request a similar therapy as an outpatient. Use of the IV route, although ideal, may be difficult if the patient has poor venous access. If this is the case, the Duragesic patch may be the best alternative. If the IV route is readily available, then a fentanyl PCA may be the best choice. If a Fentanyl PCA is elected, then order the concentration to be Fentanyl 10 mcg/ml, the dose 10 mcg (1.12m) with a lock out of 6 minutes and a 1 hour dose limit of 50 mcg, or a 4 hour dose limit of 150 mcg. If not comfortable with ordering this, the floor protocol restricts the use of Fentanyl PCA, then continue with the PRN IV Dilaudid (hydromorphone) dosing schedule.  According to the patient's chart, he has used an average of 8 mg of hydromorphone in a 24-hour period which would represent an  MDE (Morphine Dose Equivalent) of 32 mg per day. He is in  this, then we can calculate that an equivalent dose of fentanyl would be a 12.5 g per hour patch. See the chart below for guidance on opioid use in dialysis patients.    Pain Management Follow-up: This patient has been seen as a hospital consult only. I will not be taking this patient for long-term management.    Dialysis Patients and opioids [Aronoff 1999; Dean 2004]  Opioid Recommended Use Comment  Morphine Use cautiously and monitor patient for rebound pain effect or do not use. Both parent drug and metabolites can be removed with dialysis; watch for "rebound" pain  effect.  Hydromorphone/ Hydrocodone Use cautiously and monitor patient carefully for symptoms of opioid overdose. The parent drug can be removed, but metabolite accumulation is a risk.  Oxycodone Do not use. No data on oxycodone and its metabolites in dialysis.  Codeine Do not use. The parent drug and metabolites can accumulate causing adverse effects.  Methadone* Appears safe. Metabolites are inactive, but use caution because parent drug is not dialyzed.  Fentanyl* Appears safe. Metabolites are inactive, but use caution because fentanyl is poorly dialyzable.  Meperidine Do not use. Few data on meperidine and its metabolites in dialysis; risk of adverse effects.  Propoxyphene Do not use. Propoxyphene is not dialyzed. Metabolites can accumulate causing increased risk of hypoglycemia, cardiac conduction problems, and CNS and respiratory depression [Almirall et al. Monia Sabal; [place]; Thornell Mule 2003; Manuella Ghazi et al. 2006].  * Use caution because these drugs are not dialyzable.    Primary Care Physician: Pcp Not In System Location: [facility] Out[facility] Note by: Bellamy Judson A. Dossie Arbour, M.D, DABA, DABAPM, DABPM, DABIPP, FIPP

## 2016-01-19 LAB — CBC
HEMATOCRIT: 29.4 % — AB (ref 40.0–52.0)
HEMOGLOBIN: 9.4 g/dL — AB (ref 13.0–18.0)
MCH: 29.3 pg (ref 26.0–34.0)
MCHC: 31.9 g/dL — AB (ref 32.0–36.0)
MCV: 91.8 fL (ref 80.0–100.0)
Platelets: 59 10*3/uL — ABNORMAL LOW (ref 150–440)
RBC: 3.21 MIL/uL — AB (ref 4.40–5.90)
RDW: 21.2 % — ABNORMAL HIGH (ref 11.5–14.5)
WBC: 1.8 10*3/uL — ABNORMAL LOW (ref 3.8–10.6)

## 2016-01-19 LAB — PHOSPHORUS: Phosphorus: 5.2 mg/dL — ABNORMAL HIGH (ref 2.5–4.6)

## 2016-01-19 MED ORDER — TUBERCULIN PPD 5 UNIT/0.1ML ID SOLN
5.0000 [IU] | Freq: Once | INTRADERMAL | Status: AC
  Administered 2016-01-19: 5 [IU] via INTRADERMAL
  Filled 2016-01-19 (×2): qty 0.1

## 2016-01-19 MED ORDER — HYDROMORPHONE HCL 2 MG PO TABS: 8.0000 mg | ORAL_TABLET | Freq: Four times a day (QID) | ORAL | Status: DC | PRN

## 2016-01-19 MED ORDER — HYDROMORPHONE HCL 1 MG/ML IJ SOLN
2.0000 mg | INTRAMUSCULAR | Status: DC | PRN
  Administered 2016-01-19 – 2016-01-21 (×10): 2 mg via INTRAVENOUS
  Filled 2016-01-19 (×10): qty 2

## 2016-01-19 MED ORDER — DOCUSATE SODIUM 100 MG PO CAPS
100.0000 mg | ORAL_CAPSULE | Freq: Two times a day (BID) | ORAL | Status: DC
  Administered 2016-01-19 – 2016-01-20 (×2): 100 mg via ORAL
  Filled 2016-01-19 (×4): qty 1

## 2016-01-19 MED ORDER — FENTANYL 12 MCG/HR TD PT72: 12.5000 ug | MEDICATED_PATCH | TRANSDERMAL | Status: DC

## 2016-01-19 NOTE — Progress Notes (Signed)
[facility] -  at [facility]   PATIENT NAME: [name]    MR#:  [id number]  DATE OF BIRTH:  1981-01-24  SUBJECTIVE:  Patient doing well , had hemodialysis Saturday and today. Nausea is somewhat better. Patient takes Dilaudid 8 mg by mouth every 4 hours as needed for chronic pain   REVIEW OF SYSTEMS:    Review of Systems  Constitutional: Negative for fever, chills and malaise/fatigue.  HENT: Negative for sore throat.   Eyes: Negative for blurred vision.  Respiratory: Negative for cough, hemoptysis, shortness of breath and wheezing.   Cardiovascular: Negative for chest pain, palpitations and leg swelling.  Gastrointestinal: Negative for nausea, vomiting, abdominal pain, diarrhea and blood in stool.  Genitourinary: Negative for dysuria.  Musculoskeletal: Negative for back pain.  Neurological: Negative for dizziness, tremors and headaches.  Endo/Heme/Allergies: Does not bruise/bleed easily.    Tolerating Diet:yes      DRUG ALLERGIES:  No Known Allergies  VITALS:  Blood pressure 163/94, pulse 88, temperature 98.2 F (36.8 C), temperature source Oral, resp. rate 16, height  (1.702 m), weight 95.8 kg (211 lb 3.2 oz), SpO2 98 %.  PHYSICAL EXAMINATION:   Physical Exam  Constitutional: He is oriented to person, place, and time and well-developed, well-nourished, and in no distress. No distress.  Generalized edema  HENT:  Head: Normocephalic.  Eyes are swollen  Eyes: No scleral icterus.  Neck: Normal range of motion. Neck supple. No JVD present. No tracheal deviation present.  Cardiovascular: Normal rate, regular rhythm and normal heart sounds.  Exam reveals no gallop and no friction rub.   No murmur heard. Pulmonary/Chest: Effort normal and breath sounds normal. No respiratory distress. He has no wheezes. He has no rales. He exhibits no tenderness.  Abdominal: Soft. Bowel sounds are normal. He exhibits no distension and no mass.  There is no tenderness. There is no rebound and no guarding.  Musculoskeletal: Normal range of motion. He exhibits no edema.  Neurological: He is alert and oriented to person, place, and time.  Skin: Skin is warm. No rash noted. No erythema.  Right arm AV fistula  Psychiatric: Affect and judgment normal.      LABORATORY PANEL:   CBC  Recent Labs Lab 01/19/16 0947  WBC 1.8*  HGB 9.4*  HCT 29.4*  PLT 59*   ------------------------------------------------------------------------------------------------------------------  Chemistries   Recent Labs Lab 01/14/16 0713  01/18/16 0500  NA 139  < > 134*  K 4.0  < > 4.3  CL 109  < > 101  CO2 21*  < > 26  GLUCOSE 116*  < > 112*  BUN 31*  < > 33*  CREATININE 7.14*  < > 7.07*  CALCIUM 7.7*  < > 7.7*  AST 24  --   --   ALT 7*  --   --   ALKPHOS 113  --   --   BILITOT 0.8  --   --   < > = values in this interval not displayed. ------------------------------------------------------------------------------------------------------------------  Cardiac Enzymes No results for input(s): TROPONINI in the last 168 hours. ------------------------------------------------------------------------------------------------------------------  RADIOLOGY:  No results found.   ASSESSMENT AND PLAN:   [name] is a 35 y.o. black male with HIV/AIDS, ESRD, hypertension, sickle cell disease presented with uremic symptoms  1. ESRD with uremia:   HD as per nephrology, scheduled for TTS Nausea prob 2/2 uremia Seems have last gotten outpatient dialysis in 08/2014 in [facility] followed by Dr. Birdie Riddle  He now lives in [place] and needs to establish a HD place in [place], [place] assisting with placement, HD chair TTS  2. 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 Hypertension: Blood pressure is elevated  amlodipine  dose increased to 10 mg by mouth once daily  patient's home medication lisinopril 10 mg is added to the regimen and will increase the dose to 20 mg once daily At hydralazine 25 mg by mouth to the regimen   4. HIV: He states his CD$ count is high and is not taking medications. Appreciate ID recommendations Dr Sampson Goon has arranged outpatient HIV clinic in [facility] follow-up in 2 weeks Check CD4 and viral load and genotype Last CD4 count was 100 in  2015  5. Chronic pain: Continue pain meds as taking as outpatient  6 Leukopenia - prob adverse effect of hydrea - d/ced , check cbc am   Oncology consult is pending, discussed with Dr. Orlie Dakin  7. Chronic pain disorder Continue home med Dilaudid 8 mg by mouth every 6 hours as needed for pain Discontinued IV Dilaudid and patient is started on fentanyl 12.5 g patch Monitor patient closely Appreciate pain management recommendations   Management plans discussed with the patient and he is in agreement.  CODE STATUS: FULL  TOTAL TIME TAKING CARE OF THIS PATIENT: 35 minutes.     POSSIBLE D/C 2-3 days, DEPENDING ON CLINICAL CONDITION. NEEDS HD center established prior to discharge   Ramonita Lab M.D on 01/19/2016 at 4:16 PM  Between 7am to 6pm - Pager - [phone] After 6pm go to [email or web address] - password [email or web address]  [facility]  Office  [phone]  CC: Primary care physician; Pcp Not In System  Note: This dictation was prepared with Dragon dictation along with smaller phrase technology. Any transcriptional errors that result from this process are unintentional.

## 2016-01-19 NOTE — Progress Notes (Signed)
[facility]  ROUNDING NOTE   Subjective:   No acute c/o Laying in bed Denies SOB Patient seen during dialysis Tolerating well    HEMODIALYSIS FLOWSHEET:  Blood Flow Rate (mL/min): 400 mL/min Arterial Pressure (mmHg): -150 mmHg Venous Pressure (mmHg): 210 mmHg Transmembrane Pressure (mmHg): 60 mmHg Ultrafiltration Rate (mL/min): 710 mL/min Dialysate Flow Rate (mL/min): 600 ml/min Conductivity: Machine : 14 Conductivity: Machine : 14 Dialysis Fluid Bolus: Normal Saline (Prime given) Bolus Amount (mL): 250 mL Intra-Hemodialysis Comments: removed tx, ended, pt rinsebacked     Objective:  Vital signs in last 24 hours:  Temp:  [98.2 F (36.8 C)] 98.2 F (36.8 C) (01/31 0920) Pulse Rate:  [84-96] 89 (01/31 1255) Resp:  [14-18] 18 (01/31 1255) BP: (172-217)/(88-109) 199/101 mmHg (01/31 1255) SpO2:  [97 %] 97 % (01/31 0920) Weight:  [96.5 kg (212 lb 11.9 oz)] 96.5 kg (212 lb 11.9 oz) (01/31 0920)  Weight change:  Filed Weights   01/17/16 0500 01/18/16 0500 01/19/16 0920  Weight: 95.709 kg (211 lb) 95.8 kg (211 lb 3.2 oz) 96.5 kg (212 lb 11.9 oz)    Intake/Output: I/O last 3 completed shifts: In: 480 [P.O.:480] Out: -    Intake/Output this shift:  Total I/O In: 0  Out: 2000 [Other:2000]  Physical Exam: General: NAD, laying in bed  Head: Normocephalic, atraumatic. Moist oral mucosal membranes  Eyes: Anicteric,  Neck: supple  Lungs:  Clear to auscultation  Heart: Regular rate and rhythm  Abdomen:  Soft, nontender,   Extremities: trace peripheral edema.  Neurologic: Nonfocal, moving all four extremities  Skin: No lesions  Access: Right arm AVF, left arm clotted AVG    Basic Metabolic Panel:  Recent Labs Lab 01/13/16 2300 01/14/16 0713 01/16/16 1130 01/18/16 0500 01/19/16 0947  NA 140 139 137 134*  --   K 4.1 4.0 4.2 4.3  --   CL 106 109 104 101  --   CO2 22 21* 26 26  --   GLUCOSE 95 116* 89 112*  --   BUN 29* 31* 30* 33*  --    CREATININE 7.18* 7.14* 7.51* 7.07*  --   CALCIUM 7.7* 7.7* 7.8* 7.7*  --   PHOS  --   --  5.4*  5.4*  --  5.2*    Liver Function Tests:  Recent Labs Lab 01/13/16 2300 01/14/16 0713 01/16/16 1130  AST 24 24  --   ALT 8* 7*  --   ALKPHOS 120 113  --   BILITOT 0.5 0.8  --   PROT 7.0 8.3*  --   ALBUMIN 2.1* 2.7* 2.2*    Recent Labs Lab 01/13/16 2310 01/14/16 0713  LIPASE 48 53*   No results for input(s): AMMONIA in the last 168 hours.  CBC:  Recent Labs Lab 01/13/16 2300 01/14/16 0713 01/16/16 1130 01/17/16 0445 01/18/16 0500 01/19/16 0947  WBC 2.6* 2.6* 1.8* 1.5* 1.8* 1.8*  NEUTROABS 1.3* 1.5  --   --   --   --   HGB 9.0* 10.1* 8.8* 8.9* 8.9* 9.4*  HCT 29.5* 31.7* 27.6* 27.9* 27.9* 29.4*  MCV 94.9 94.3 92.1 92.4 92.6 91.8  PLT 115* 97* 61* 57* 51* 59*    Cardiac Enzymes: No results for input(s): CKTOTAL, CKMB, CKMBINDEX, TROPONINI in the last 168 hours.  BNP: Invalid input(s): POCBNP  CBG: No results for input(s): GLUCAP in the last 168 hours.  Microbiology: Results for orders placed or performed during the hospital encounter of 07/06/14  Blood  culture (routine x 2)     Status: None   Collection Time: 07/06/14  4:58 AM  Result Value Ref Range Status   Specimen Description BLOOD RIGHT HAND  Final   Special Requests BOTTLES DRAWN AEROBIC AND ANAEROBIC [facility] EACH  Final   Culture  Setup Time   Final    07/06/2014 18:07 Performed at [facility]   Culture   Final    NO GROWTH 5 DAYS Performed at [facility]   Report Status 07/12/2014 FINAL  Final    Coagulation Studies: No results for input(s): LABPROT, INR in the last 72 hours.  Urinalysis: No results for input(s): COLORURINE, LABSPEC, PHURINE, GLUCOSEU, HGBUR, BILIRUBINUR, KETONESUR, PROTEINUR, UROBILINOGEN, NITRITE, LEUKOCYTESUR in the last 72 hours.  Invalid input(s): APPERANCEUR    Imaging: No results found.   Medications:     . amLODipine  10 mg Oral Daily  .  calcitRIOL  0.25 mcg Oral Daily  . calcium acetate  2,001 mg Oral TID WC  . folic acid  1 mg Oral Daily  . lisinopril  10 mg Oral Daily  . multivitamin  1 tablet Oral QHS  . sevelamer carbonate  800 mg Oral TID WC  . sodium chloride flush  3 mL Intravenous Q12H  . tuberculin  5 Units Intradermal Once   sodium chloride, acetaminophen **OR** acetaminophen, albuterol, bisacodyl, diphenhydrAMINE, diphenhydrAMINE, HYDROmorphone (DILAUDID) injection, HYDROmorphone, [DISCONTINUED] ondansetron **OR** ondansetron (ZOFRAN) IV, ondansetron, polyethylene glycol, sodium chloride flush  Assessment/ Plan:  [name] is a 35 y.o. black male with HIV/AIDS, ESRD, hypertension, sickle cell disease  1. ESRD: Tolerating treatment well.  Seems have last gotten outpatient dialysis in 08/2014 in [facility] followed by Dr. Birdie Riddle. However previously followed in [place], [place] going to [facility]. Patient states he was incarcerated in [place]. Not providing clear information as to his dialysis history - may need records related to previous outpatient dialysis - He now wants to live in [place] with his room mate (near [place] outlets, will try for [facility] [place]) - Dialysis liason aware.   - Continue TTS schedule for now.     2. 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 Secondary Hyperparathyroidism:  Phosphorus 5.2  - not currently on a binder.    LOS: 5 [name] 1/31/201712:59 PM

## 2016-01-19 NOTE — Progress Notes (Signed)
Spoke to patient about change in pain medication from IV dilaudid to a transdermal fentanyl patch with converting over to PO dilaudid. Pt was very upset and stated that "the fentanyl patch does nothing for me so you need to call the doctor". I called Dr. Cheron Schaumann and advised her of the patients statements and she gave a verbal order to switch back to the IV dilaudid. Adelina Mings RN-BC

## 2016-01-20 DIAGNOSIS — I10 Essential (primary) hypertension: Secondary | ICD-10-CM

## 2016-01-20 DIAGNOSIS — D61818 Other pancytopenia: Secondary | ICD-10-CM

## 2016-01-20 DIAGNOSIS — F17210 Nicotine dependence, cigarettes, uncomplicated: Secondary | ICD-10-CM

## 2016-01-20 DIAGNOSIS — Z992 Dependence on renal dialysis: Secondary | ICD-10-CM

## 2016-01-20 DIAGNOSIS — Z808 Family history of malignant neoplasm of other organs or systems: Secondary | ICD-10-CM

## 2016-01-20 DIAGNOSIS — Z79899 Other long term (current) drug therapy: Secondary | ICD-10-CM

## 2016-01-20 DIAGNOSIS — R918 Other nonspecific abnormal finding of lung field: Secondary | ICD-10-CM

## 2016-01-20 DIAGNOSIS — M2550 Pain in unspecified joint: Secondary | ICD-10-CM

## 2016-01-20 DIAGNOSIS — N186 End stage renal disease: Secondary | ICD-10-CM

## 2016-01-20 DIAGNOSIS — D571 Sickle-cell disease without crisis: Secondary | ICD-10-CM

## 2016-01-20 DIAGNOSIS — Z765 Malingerer [conscious simulation]: Secondary | ICD-10-CM

## 2016-01-20 DIAGNOSIS — B20 Human immunodeficiency virus [HIV] disease: Secondary | ICD-10-CM

## 2016-01-20 DIAGNOSIS — Z8719 Personal history of other diseases of the digestive system: Secondary | ICD-10-CM

## 2016-01-20 LAB — BASIC METABOLIC PANEL
Anion gap: 5 (ref 5–15)
BUN: 29 mg/dL — AB (ref 6–20)
CALCIUM: 7.8 mg/dL — AB (ref 8.9–10.3)
CO2: 30 mmol/L (ref 22–32)
CREATININE: 6.15 mg/dL — AB (ref 0.61–1.24)
Chloride: 101 mmol/L (ref 101–111)
GFR calc Af Amer: 12 mL/min — ABNORMAL LOW (ref >60)
GFR, EST NON AFRICAN AMERICAN: 11 mL/min — AB (ref >60)
GLUCOSE: 77 mg/dL (ref 65–99)
POTASSIUM: 4.5 mmol/L (ref 3.5–5.1)
SODIUM: 136 mmol/L (ref 135–145)

## 2016-01-20 LAB — CBC
HCT: 27.3 % — ABNORMAL LOW (ref 40.0–52.0)
Hemoglobin: 8.8 g/dL — ABNORMAL LOW (ref 13.0–18.0)
MCH: 30.1 pg (ref 26.0–34.0)
MCHC: 32.2 g/dL (ref 32.0–36.0)
MCV: 93.6 fL (ref 80.0–100.0)
PLATELETS: 52 10*3/uL — AB (ref 150–440)
RBC: 2.92 MIL/uL — AB (ref 4.40–5.90)
RDW: 21 % — AB (ref 11.5–14.5)
WBC: 2 10*3/uL — ABNORMAL LOW (ref 3.8–10.6)

## 2016-01-20 MED ORDER — NEPRO/CARBSTEADY PO LIQD
237.0000 mL | ORAL | Status: DC
  Administered 2016-01-20: 237 mL via ORAL

## 2016-01-20 MED ORDER — HYDROMORPHONE HCL 2 MG PO TABS
4.0000 mg | ORAL_TABLET | ORAL | Status: DC | PRN
Start: 2016-01-20 — End: 2016-01-21
  Filled 2016-01-20: qty 2

## 2016-01-20 NOTE — Progress Notes (Signed)
[facility]  ROUNDING NOTE   Subjective:   [place]     Objective:  Vital signs in last 24 hours:  Temp:  [98 F (36.7 C)-98.9 F (37.2 C)] 98 F (36.7 C) (02/01 0626) Pulse Rate:  [88-109] 97 (02/01 0626) Resp:  [16-18] 16 (02/01 0626) BP: (155-201)/(74-101) 155/74 mmHg (02/01 0626) SpO2:  [92 %-98 %] 94 % (02/01 0626) Weight:  [95.8 kg (211 lb 3.2 oz)-97.16 kg (214 lb 3.2 oz)] 97.16 kg (214 lb 3.2 oz) (02/01 0624)  Weight change:  Filed Weights   01/19/16 0920 01/19/16 1255 01/20/16 0624  Weight: 96.5 kg (212 lb 11.9 oz) 95.8 kg (211 lb 3.2 oz) 97.16 kg (214 lb 3.2 oz)    Intake/Output: I/O last 3 completed shifts: In: 240 [P.O.:240] Out: 2000 [Other:2000]   Intake/Output this shift:     Physical Exam: General: NAD, laying in bed  Head: Normocephalic, atraumatic. Moist oral mucosal membranes  Eyes: Anicteric,  Neck: supple  Lungs:  Clear to auscultation  Heart: Regular rate and rhythm  Abdomen:  Soft, nontender,   Extremities: trace peripheral edema.  Neurologic: Nonfocal, moving all four extremities  Skin: No lesions  Access: Right arm AVF, left arm clotted AVG    Basic Metabolic Panel:  Recent Labs Lab 01/13/16 2300 01/14/16 0713 01/16/16 1130 01/18/16 0500 01/19/16 0947 01/20/16 0537  NA 140 139 137 134*  --  136  K 4.1 4.0 4.2 4.3  --  4.5  CL 106 109 104 101  --  101  CO2 22 21* 26 26  --  30  GLUCOSE 95 116* 89 112*  --  77  BUN 29* 31* 30* 33*  --  29*  CREATININE 7.18* 7.14* 7.51* 7.07*  --  6.15*  CALCIUM 7.7* 7.7* 7.8* 7.7*  --  7.8*  PHOS  --   --  5.4*  5.4*  --  5.2*  --     Liver Function Tests:  Recent Labs Lab 01/13/16 2300 01/14/16 0713 01/16/16 1130  AST 24 24  --   ALT 8* 7*  --   ALKPHOS 120 113  --   BILITOT 0.5 0.8  --   PROT 7.0 8.3*  --   ALBUMIN 2.1* 2.7* 2.2*    Recent Labs Lab 01/13/16 2310 01/14/16 0713  LIPASE 48 53*   No results for input(s):  AMMONIA in the last 168 hours.  CBC:  Recent Labs Lab 01/13/16 2300 01/14/16 0713 01/16/16 1130 01/17/16 0445 01/18/16 0500 01/19/16 0947 01/20/16 0537  WBC 2.6* 2.6* 1.8* 1.5* 1.8* 1.8* 2.0*  NEUTROABS 1.3* 1.5  --   --   --   --   --   HGB 9.0* 10.1* 8.8* 8.9* 8.9* 9.4* 8.8*  HCT 29.5* 31.7* 27.6* 27.9* 27.9* 29.4* 27.3*  MCV 94.9 94.3 92.1 92.4 92.6 91.8 93.6  PLT 115* 97* 61* 57* 51* 59* 52*    Cardiac Enzymes: No results for input(s): CKTOTAL, CKMB, CKMBINDEX, TROPONINI in the last 168 hours.  BNP: Invalid input(s): POCBNP  CBG: No results for input(s): GLUCAP in the last 168 hours.  Microbiology: Results for orders placed or performed during the hospital encounter of 07/06/14  Blood culture (routine x 2)     Status: None   Collection Time: 07/06/14  4:58 AM  Result Value Ref Range Status   Specimen Description BLOOD RIGHT HAND  Final   Special Requests BOTTLES DRAWN AEROBIC AND ANAEROBIC [facility] EACH  Final   Culture  Setup Time   Final    07/06/2014 18:07 Performed at [facility]   Culture   Final    NO GROWTH 5 DAYS Performed at [facility]   Report Status 07/12/2014 FINAL  Final    Coagulation Studies: No results for input(s): LABPROT, INR in the last 72 hours.  Urinalysis: No results for input(s): COLORURINE, LABSPEC, PHURINE, GLUCOSEU, HGBUR, BILIRUBINUR, KETONESUR, PROTEINUR, UROBILINOGEN, NITRITE, LEUKOCYTESUR in the last 72 hours.  Invalid input(s): APPERANCEUR    Imaging: No results found.   Medications:     . amLODipine  10 mg Oral Daily  . calcitRIOL  0.25 mcg Oral Daily  . calcium acetate  2,001 mg Oral TID WC  . docusate sodium  100 mg Oral BID  . folic acid  1 mg Oral Daily  . lisinopril  10 mg Oral Daily  . multivitamin  1 tablet Oral QHS  . sevelamer carbonate  800 mg Oral TID WC  . sodium chloride flush  3 mL Intravenous Q12H  . tuberculin  5 Units Intradermal Once   sodium chloride, acetaminophen **OR**  acetaminophen, albuterol, bisacodyl, diphenhydrAMINE, diphenhydrAMINE, HYDROmorphone (DILAUDID) injection, [DISCONTINUED] ondansetron **OR** ondansetron (ZOFRAN) IV, ondansetron, polyethylene glycol, sodium chloride flush  Assessment/ Plan:  [name] is a 35 y.o. black male with HIV/AIDS, ESRD, hypertension, sickle cell disease  1. ESRD: Seems have last received outpatient dialysis in 08/2014 in [facility] followed by Dr. Birdie Riddle. However previously followed in [place], [place] going to [facility]. Patient states he was incarcerated in [place] where he was getting dialysis in the jail facility - may need records related to previous outpatient dialysis - He now wants to live in [place] with his room mate (near [place] outlets, will try for [facility]) - Dialysis liason aware.   - Continue TTS schedule for now.     2. 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 Secondary Hyperparathyroidism:  Phosphorus 5.2  - not currently on a binder.    LOS: 6 [name] 2/1/201712:18 PM

## 2016-01-20 NOTE — Care Management Important Message (Signed)
Important Message  Patient Details  Name: [name] MRN: [id number] Date of Birth: 22-May-1981   Medicare Important Message Given:  Yes    Olegario Messier [name] 01/20/2016, 10:44 AM

## 2016-01-20 NOTE — Progress Notes (Signed)
[facility] -  at [facility]   PATIENT NAME: [name]    MR#:  [id number]  DATE OF BIRTH:  1981/07/27  SUBJECTIVE:  CHIEF COMPLAINT:   Chief Complaint  Patient presents with  . Sickle Cell Pain Crisis   Patient complains of nausea without vomiting, states pain is stable as long as he is receiving IV pain medication. REVIEW OF SYSTEMS:  CONSTITUTIONAL: No fever, fatigue or weakness.  EYES: No blurred or double vision.  EARS, NOSE, AND THROAT: No tinnitus or ear pain.  RESPIRATORY: No cough, shortness of breath, wheezing or hemoptysis.  CARDIOVASCULAR: No chest pain, orthopnea, edema.  GASTROINTESTINAL: Positive nausea, denies vomiting, diarrhea or abdominal pain.  GENITOURINARY: No dysuria, hematuria.  ENDOCRINE: No polyuria, nocturia,  HEMATOLOGY: No anemia, easy bruising or bleeding SKIN: No rash or lesion. MUSCULOSKELETAL: No joint pain or arthritis.   NEUROLOGIC: No tingling, numbness, weakness.  PSYCHIATRY: No anxiety or depression.   DRUG ALLERGIES:  No Known Allergies  VITALS:  Blood pressure 155/74, pulse 97, temperature 98 F (36.7 C), temperature source Oral, resp. rate 16, height  (1.702 m), weight 97.16 kg (214 lb 3.2 oz), SpO2 94 %.  PHYSICAL EXAMINATION:  VITAL SIGNS: Filed Vitals:   01/19/16 1944 01/20/16 0626  BP: 156/87 155/74  Pulse: 109 97  Temp: 98.9 F (37.2 C) 98 F (36.7 C)  Resp: 16 16   GENERAL:34 y.o.male currently sleeping, easily arousable in no acute distress HEAD: Normocephalic, atraumatic.  EYES: Pupils equal, round, reactive to light. Extraocular muscles intact. No scleral icterus.  MOUTH: Moist mucosal membrane. Dentition intact. No abscess noted.  EAR, NOSE, THROAT: Clear without exudates. No external lesions.  NECK: Supple. No thyromegaly. No nodules. No JVD.  PULMONARY: Clear to ascultation, without wheeze rails or rhonci. No use of accessory muscles, Good respiratory effort. good air  entry bilaterally CHEST: Nontender to palpation. Dialysis access left chest without surrounding erythema or discharge CARDIOVASCULAR: S1 and S2. Regular rate and rhythm. No murmurs, rubs, or gallops. No edema. Pedal pulses 2+ bilaterally.  GASTROINTESTINAL: Soft, nontender, nondistended. No masses. Positive bowel sounds. No hepatosplenomegaly.  MUSCULOSKELETAL: No swelling, clubbing, or edema. Range of motion full in all extremities.  NEUROLOGIC: Cranial nerves II through XII are intact. No gross focal neurological deficits. Sensation intact. Reflexes intact.  SKIN: No ulceration, lesions, rashes, or cyanosis. Skin warm and dry. Turgor intact.  PSYCHIATRIC: Mood, affect within normal limits. The patient is awake, alert and oriented x 3. Insight, judgment intact.      LABORATORY PANEL:   CBC  Recent Labs Lab 01/20/16 0537  WBC 2.0*  HGB 8.8*  HCT 27.3*  PLT 52*   ------------------------------------------------------------------------------------------------------------------  Chemistries   Recent Labs Lab 01/14/16 0713  01/20/16 0537  NA 139  < > 136  K 4.0  < > 4.5  CL 109  < > 101  CO2 21*  < > 30  GLUCOSE 116*  < > 77  BUN 31*  < > 29*  CREATININE 7.14*  < > 6.15*  CALCIUM 7.7*  < > 7.8*  AST 24  --   --   ALT 7*  --   --   ALKPHOS 113  --   --   BILITOT 0.8  --   --   < > = values in this interval not displayed. ------------------------------------------------------------------------------------------------------------------  Cardiac Enzymes No results for input(s): TROPONINI in the last 168 hours. ------------------------------------------------------------------------------------------------------------------  RADIOLOGY:  No results found.  EKG:  Orders placed or performed during the hospital encounter of 11/15/14  . EKG test  . EKG 12-Lead  . EKG test  . EKG 12-Lead  . EKG 12-Lead  . EKG 12-Lead    ASSESSMENT AND PLAN:   35 y.o.  African-American gentleman history of HIV/AIDS, ESRD noncompliant with dialysis, essential hypertension, sickle cell disease presented with uremic symptoms  1. ESRD with uremia:  HD as per nephrology, scheduled for TTS Continued nausea in setting of uremia needs to establish a HD place in [place], CM assisting with placement, HD chair TTS  2. sickle cell disease not in crisis:  Holding Hydroxyurea- which could be the etiology of pancytopenia, continue current pain regimen transitioned to oral pain medication at home dosage when tolerating by mouth meds Patient refused fentanyl patch per chronic pain recommendations  3. Essential Hypertension: Blood pressure remains elevated however, better controlled previously Continue amlodipine , lisinopril, hydralazine  4. HIV: He states his CD$ count is high and is not taking medications. Appreciate ID recommendations [name] has arranged outpatient HIV clinic in [facility] follow-up in 2 weeks Check CD4 and viral load and genotype Last CD4 count was 100 in 2015  5. Leukopenia - prob adverse effect of hydrea - d/ced , check cbc am  Oncology consult is pending, discussed with [name]         All the records are reviewed and case discussed with Care Management/Social Workerr. Management plans discussed with the patient, family and they are in agreement.  CODE STATUS: Full  TOTAL TIME TAKING CARE OF THIS PATIENT: 28 minutes.   POSSIBLE D/C IN one to 2 DAYS, DEPENDING ON CLINICAL CONDITION.   [name],  [name].D on 01/20/2016 at 10:33 AM  Between 7am to 6pm - Pager - [phone]  After 6pm: House Pager: - [phone]  [name]  Office  [phone]  CC: Primary care physician; Pcp Not In System

## 2016-01-20 NOTE — Consult Note (Signed)
[facility]  [phone] Fax:(336) [phone]  ID: [name] OB: 1981-05-03  MR#: [id number]  OVF#:[id number]  Patient Care Team: Pcp Not In System as PCP - General No Pcp Per Patient (General Practice) Provider Not In System  CHIEF COMPLAINT:  Chief Complaint  Patient presents with  . Sickle Cell Pain Crisis    INTERVAL HISTORY: Patient is a 35 year old male with multiple medical problems including end-stage renal disease, HIV, and sickle cell disease. He was recently admitted to hospital for sickle cell pain crisis. Patient states his pain is better controlled and he feels nearly back to his baseline. He has no neurologic complaints. He denies any recent fevers. He denies any chest pain or shortness of breath. He denies any easy bleeding or bruising. He denies any nausea, vomiting, constipation, or diarrhea. Patient otherwise feels well and offers no further specific complaints.  REVIEW OF SYSTEMS:   Review of Systems  Constitutional: Negative.  Negative for fever, weight loss and malaise/fatigue.  Respiratory: Negative.  Negative for hemoptysis and shortness of breath.   Cardiovascular: Negative.  Negative for chest pain.  Gastrointestinal: Negative.  Negative for blood in stool and melena.  Musculoskeletal: Positive for joint pain.  Neurological: Negative.  Negative for weakness.  Endo/Heme/Allergies: Does not bruise/bleed easily.    As per HPI. Otherwise, a complete review of systems is negatve.  PAST MEDICAL HISTORY: Past Medical History  Diagnosis Date  . Sickle cell anemia (HCC)   . ESRD on hemodialysis ([place])   . HIV disease ([place])   . Drug-seeking behavior   . Sickle cell anemia (HCC)     States Dx at 35 yo, s/p multiple prior transfusions, typically with pain in legs, back  . HIV (human immunodeficiency virus infection) ([place]) 2003    Followed by Patricia Nettle at [facility], [place]   . HTN (hypertension)   . Pulmonary  mass     Unclear history. States PCP was following a pulmonary mass with serial imaging  . Pancreatitis 2012    Denies any alcohol use   . Hypertension   . Blood transfusion     "several"  . End stage renal disease on dialysis [facility]) HD in 2009     Followed by Dr. Viann Shove in [place]. States due to HTN/DM.   Marland Kitchen ESRD (end stage renal disease) on dialysis [facility])     "[place]; [place], [place]; TTS" (08/26/2014)  . Renal failure     PAST SURGICAL HISTORY: Past Surgical History  Procedure Laterality Date  . Hip surgery    . Dialysis fistula creation Left     "arm"  . Hip fracture surgery Right     "screw placed into joint"  . Av fistula placement Left     "arm"    FAMILY HISTORY Family History  Problem Relation Age of Onset  . Hypertension Father   . Diabetes      Uncles and grandfather  . Cervical cancer Mother     Dec 46yo of cervical ca       ADVANCED DIRECTIVES:    HEALTH MAINTENANCE: Social History  Substance Use Topics  . Smoking status: Current Every Day Smoker -- 0.12 packs/day for 18 years    Types: Cigarettes  . Smokeless tobacco: Never Used  . Alcohol Use: No     Colonoscopy:  PAP:  Bone density:  Lipid panel:  No Known Allergies  Current Facility-Administered Medications  Medication Dose Route Frequency Provider Last Rate Last Dose  .  0.9 %  sodium chloride infusion  250 mL Intravenous PRN Hillary Bow, MD      . acetaminophen (TYLENOL) tablet 650 mg  650 mg Oral Q6H PRN Hillary Bow, MD       Or  . acetaminophen (TYLENOL) suppository 650 mg  650 mg Rectal Q6H PRN Srikar Sudini, MD      . albuterol (PROVENTIL) (2.5 MG/3ML) 0.083% nebulizer solution 2.5 mg  2.5 mg Nebulization Q2H PRN Srikar Sudini, MD      . amLODipine (NORVASC) tablet 10 mg  10 mg Oral Daily Nicholes Mango, MD   10 mg at 01/20/16 1119  . bisacodyl (DULCOLAX) EC tablet 5 mg  5 mg Oral Daily PRN Srikar Sudini, MD      . calcitRIOL (ROCALTROL) capsule 0.25 mcg  0.25 mcg Oral Daily  Srikar Sudini, MD   0.25 mcg at 01/20/16 1119  . calcium acetate (PHOSLO) capsule 2,001 mg  2,001 mg Oral TID WC Hillary Bow, MD   2,001 mg at 01/20/16 1232  . diphenhydrAMINE (BENADRYL) capsule 50 mg  50 mg Oral Q6H PRN Srikar Sudini, MD      . diphenhydrAMINE (BENADRYL) injection 25 mg  25 mg Intravenous Q6H PRN Hillary Bow, MD   25 mg at 01/20/16 1232  . docusate sodium (COLACE) capsule 100 mg  100 mg Oral BID Nicholes Mango, MD   100 mg at 01/20/16 1119  . folic acid (FOLVITE) tablet 1 mg  1 mg Oral Daily Hillary Bow, MD   1 mg at 01/20/16 1119  . HYDROmorphone (DILAUDID) injection 2 mg  2 mg Intravenous Q4H PRN Nicholes Mango, MD   2 mg at 01/20/16 1231  . lisinopril (PRINIVIL,ZESTRIL) tablet 10 mg  10 mg Oral Daily Nicholes Mango, MD   10 mg at 01/20/16 1119  . multivitamin (RENA-VIT) tablet 1 tablet  1 tablet Oral QHS Hillary Bow, MD   1 tablet at 01/19/16 2027  . ondansetron (ZOFRAN) injection 4 mg  4 mg Intravenous Q6H PRN Hillary Bow, MD   4 mg at 01/20/16 0023  . ondansetron (ZOFRAN-ODT) disintegrating tablet 8 mg  8 mg Oral Q6H PRN Hillary Bow, MD   8 mg at 01/14/16 1445  . polyethylene glycol (MIRALAX / GLYCOLAX) packet 17 g  17 g Oral Daily PRN Srikar Sudini, MD      . sevelamer carbonate (RENVELA) tablet 800 mg  800 mg Oral TID WC Srikar Sudini, MD   800 mg at 01/20/16 1232  . sodium chloride flush (NS) 0.9 % injection 3 mL  3 mL Intravenous Q12H Srikar Sudini, MD   3 mL at 01/20/16 1000  . sodium chloride flush (NS) 0.9 % injection 3 mL  3 mL Intravenous PRN Srikar Sudini, MD      . tuberculin injection 5 Units  5 Units Intradermal Once Nicholes Mango, MD   5 Units at 01/19/16 1723    OBJECTIVE: Filed Vitals:   01/20/16 0626 01/20/16 1255  BP: 155/74 156/77  Pulse: 97 112  Temp: 98 F (36.7 C) 98.6 F (37 C)  Resp: 16 18     Body mass index is 33.54 kg/(m^2).    ECOG FS:0 - Asymptomatic  General: Well-developed, well-nourished, no acute distress. Eyes: Pink conjunctiva,  anicteric sclera. HEENT: Normocephalic, moist mucous membranes, clear oropharnyx. Lungs: Clear to auscultation bilaterally. Heart: Regular rate and rhythm. No rubs, murmurs, or gallops. Abdomen: Soft, nontender, nondistended. No organomegaly noted, normoactive bowel sounds. Musculoskeletal: No edema, cyanosis, or clubbing. AV fistula in  right upper arm. Neuro: Alert, answering all questions appropriately. Cranial nerves grossly intact. Skin: No rashes or petechiae noted. Psych: Normal affect.  LAB RESULTS:  Lab Results  Component Value Date   NA 136 01/20/2016   K 4.5 01/20/2016   CL 101 01/20/2016   CO2 30 01/20/2016   GLUCOSE 77 01/20/2016   BUN 29* 01/20/2016   CREATININE 6.15* 01/20/2016   CALCIUM 7.8* 01/20/2016   PROT 8.3* 01/14/2016   ALBUMIN 2.2* 01/16/2016   AST 24 01/14/2016   ALT 7* 01/14/2016   ALKPHOS 113 01/14/2016   BILITOT 0.8 01/14/2016   GFRNONAA 11* 01/20/2016   GFRAA 12* 01/20/2016    Lab Results  Component Value Date   WBC 2.0* 01/20/2016   NEUTROABS 1.5 01/14/2016   HGB 8.8* 01/20/2016   HCT 27.3* 01/20/2016   MCV 93.6 01/20/2016   PLT 52* 01/20/2016     STUDIES: Dg Chest 2 View  01/13/2016  CLINICAL DATA:  Sickle cell crisis. Emesis for 2 days. Lower extremity edema for 1 week EXAM: CHEST  2 VIEW COMPARISON:  September 12, 2014 FINDINGS: Central catheter tip is at the cavoatrial junction. No pneumothorax. There are stent grafts in both subclavian regions as well as in the left brachial and axillary regions. There is slight scarring in the left lower lobe. There is cardiomegaly with perihilar interstitial prominence. The pulmonary vascularity is normal. No adenopathy. 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 There is evidence of prior infarcts in each distal clavicle with widening of each acromioclavicular joint. IMPRESSION: Cardiomegaly with slight perihilar edema. No airspace consolidation. Bony changes consistent with known  sickle cell disease. Central catheter tip at cavoatrial junction.  No pneumothorax. Electronically Signed   By: Lowella Grip III M.D.   On: 01/13/2016 20:39    ASSESSMENT: Pancytopenia, sickle cell disease, end-stage renal disease, HIV.  PLAN:    1. Pancytopenia: Likely multifactorial. Secondary to Hydrea, sickle cell disease, end-stage renal disease, and untreated HIV. Patient's blood counts are decreased, but stable. Hydrea has discontinued. Have recommended patient to hold his Hydrea until he gets an appointment with the sickle cell clinic in [place]. Although patient's CD4 count is adequate, he still has active untreated HIV which may be suppressing his bone marrow. Have instructed patient to keep his follow-up appointment with infectious disease to initiate treatment for his HIV. Continue to monitor blood counts all patient is in the hospital. No further intervention is needed. Patient does not require bone marrow biopsy at this time. 2. Sickle cell disease: Patient's hemoglobin is adequate at 8.8 today. He does not require a blood transfusion. Hold Hydrea as above until he has follow-up as an outpatient in [place]. 3. End-stage renal disease: Continue dialysis as per nephrology. 4. HIV: Appreciate infectious disease input. Follow-up as outpatient to consider initiation of treatment. 5. Sickle cell pain: Patient does not complain of pain today. Continue current narcotic medications as prescribed. Follow-up in sickle cell clinic as above.  Appreciate consult, call with questions. Please arrange follow-up at the sickle cell clinic in [place] prior to discharge.  Christopher Huger, MD   01/20/2016 1:08 PM

## 2016-01-21 LAB — BASIC METABOLIC PANEL
Anion gap: 7 (ref 5–15)
BUN: 40 mg/dL — AB (ref 6–20)
CO2: 27 mmol/L (ref 22–32)
Calcium: 7.7 mg/dL — ABNORMAL LOW (ref 8.9–10.3)
Chloride: 98 mmol/L — ABNORMAL LOW (ref 101–111)
Creatinine, Ser: 7.31 mg/dL — ABNORMAL HIGH (ref 0.61–1.24)
GFR calc Af Amer: 10 mL/min — ABNORMAL LOW (ref >60)
GFR, EST NON AFRICAN AMERICAN: 9 mL/min — AB (ref >60)
GLUCOSE: 74 mg/dL (ref 65–99)
POTASSIUM: 5.2 mmol/L — AB (ref 3.5–5.1)
Sodium: 132 mmol/L — ABNORMAL LOW (ref 135–145)

## 2016-01-21 LAB — CBC
HEMATOCRIT: 28.4 % — AB (ref 40.0–52.0)
Hemoglobin: 9 g/dL — ABNORMAL LOW (ref 13.0–18.0)
MCH: 29.3 pg (ref 26.0–34.0)
MCHC: 31.7 g/dL — AB (ref 32.0–36.0)
MCV: 92.4 fL (ref 80.0–100.0)
Platelets: 55 10*3/uL — ABNORMAL LOW (ref 150–440)
RBC: 3.07 MIL/uL — ABNORMAL LOW (ref 4.40–5.90)
RDW: 20.6 % — AB (ref 11.5–14.5)
WBC: 2 10*3/uL — ABNORMAL LOW (ref 3.8–10.6)

## 2016-01-21 MED ORDER — HYDROMORPHONE HCL 2 MG PO TABS: 4.0000 mg | ORAL_TABLET | ORAL | Status: DC | PRN

## 2016-01-21 MED ORDER — HYDROMORPHONE HCL 1 MG/ML IJ SOLN
2.0000 mg | INTRAMUSCULAR | Status: DC | PRN
  Administered 2016-01-21 – 2016-01-22 (×8): 2 mg via INTRAVENOUS
  Filled 2016-01-21 (×8): qty 2

## 2016-01-21 MED ORDER — HYDROMORPHONE HCL 2 MG/ML IJ SOLN: 2.0000 mg | INTRAMUSCULAR | Status: DC | PRN

## 2016-01-21 MED ORDER — HYDROMORPHONE HCL 2 MG PO TABS
8.0000 mg | ORAL_TABLET | ORAL | Status: DC | PRN
  Filled 2016-01-21: qty 4

## 2016-01-21 MED ORDER — POLYETHYLENE GLYCOL 3350 17 G PO PACK
17.0000 g | PACK | Freq: Every day | ORAL | Status: DC
  Filled 2016-01-21: qty 1

## 2016-01-21 NOTE — Progress Notes (Signed)
[facility]  ROUNDING NOTE   Subjective:   Patient seen during dialysis Tolerating well    HEMODIALYSIS FLOWSHEET:  Blood Flow Rate (mL/min): 400 mL/min Arterial Pressure (mmHg): -140 mmHg Venous Pressure (mmHg): 210 mmHg Transmembrane Pressure (mmHg): 40 mmHg Ultrafiltration Rate (mL/min): 430 mL/min Dialysate Flow Rate (mL/min): 800 ml/min Conductivity: Machine : 14 Conductivity: Machine : 14 Dialysis Fluid Bolus: Normal Saline (Prime given) Bolus Amount (mL): 250 mL (prime given) Intra-Hemodialysis Comments: removed       Objective:  Vital signs in last 24 hours:  Temp:  [98.1 F (36.7 C)-98.6 F (37 C)] 98.1 F (36.7 C) (02/02 0930) Pulse Rate:  [71-112] 82 (02/02 1100) Resp:  [11-18] 12 (02/02 1100) BP: (146-170)/(77-98) 170/89 mmHg (02/02 1100) SpO2:  [94 %-95 %] 95 % (02/02 0930) Weight:  [98.4 kg (216 lb 14.9 oz)] 98.4 kg (216 lb 14.9 oz) (02/02 0930)  Weight change:  Filed Weights   01/19/16 1255 01/20/16 0624 01/21/16 0930  Weight: 95.8 kg (211 lb 3.2 oz) 97.16 kg (214 lb 3.2 oz) 98.4 kg (216 lb 14.9 oz)    Intake/Output: I/O last 3 completed shifts: In: 340 [P.O.:340] Out: 0    Intake/Output this shift:     Physical Exam: General: NAD, laying in bed  Head: Normocephalic, atraumatic. Moist oral mucosal membranes  Eyes: Anicteric,  Neck: supple  Lungs:  Clear to auscultation  Heart: Regular rate and rhythm  Abdomen:  Soft, nontender,   Extremities: trace peripheral edema.  Neurologic: Nonfocal, moving all four extremities  Skin: No lesions  Access: Right arm AVF, left arm clotted AVG    Basic Metabolic Panel:  Recent Labs Lab 01/16/16 1130 01/18/16 0500 01/19/16 0947 01/20/16 0537 01/21/16 1028  NA 137 134*  --  136 132*  K 4.2 4.3  --  4.5 5.2*  CL 104 101  --  101 98*  CO2 26 26  --  30 27  GLUCOSE 89 112*  --  77 74  BUN 30* 33*  --  29* 40*  CREATININE 7.51* 7.07*  --  6.15* 7.31*  CALCIUM 7.8* 7.7*   --  7.8* 7.7*  PHOS 5.4*  5.4*  --  5.2*  --   --     Liver Function Tests:  Recent Labs Lab 01/16/16 1130  ALBUMIN 2.2*   No results for input(s): LIPASE, AMYLASE in the last 168 hours. No results for input(s): AMMONIA in the last 168 hours.  CBC:  Recent Labs Lab 01/17/16 0445 01/18/16 0500 01/19/16 0947 01/20/16 0537 01/21/16 1028  WBC 1.5* 1.8* 1.8* 2.0* 2.0*  HGB 8.9* 8.9* 9.4* 8.8* 9.0*  HCT 27.9* 27.9* 29.4* 27.3* 28.4*  MCV 92.4 92.6 91.8 93.6 92.4  PLT 57* 51* 59* 52* 55*    Cardiac Enzymes: No results for input(s): CKTOTAL, CKMB, CKMBINDEX, TROPONINI in the last 168 hours.  BNP: Invalid input(s): POCBNP  CBG: No results for input(s): GLUCAP in the last 168 hours.  Microbiology: Results for orders placed or performed during the hospital encounter of 07/06/14  Blood culture (routine x 2)     Status: None   Collection Time: 07/06/14  4:58 AM  Result Value Ref Range Status   Specimen Description BLOOD RIGHT HAND  Final   Special Requests BOTTLES DRAWN AEROBIC AND ANAEROBIC [facility] EACH  Final   Culture  Setup Time   Final    07/06/2014 18:07 Performed at [facility]   Culture   Final    NO  GROWTH 5 DAYS Performed at [facility]   Report Status 07/12/2014 FINAL  Final    Coagulation Studies: No results for input(s): LABPROT, INR in the last 72 hours.  Urinalysis: No results for input(s): COLORURINE, LABSPEC, PHURINE, GLUCOSEU, HGBUR, BILIRUBINUR, KETONESUR, PROTEINUR, UROBILINOGEN, NITRITE, LEUKOCYTESUR in the last 72 hours.  Invalid input(s): APPERANCEUR    Imaging: No results found.   Medications:     . amLODipine  10 mg Oral Daily  . calcitRIOL  0.25 mcg Oral Daily  . calcium acetate  2,001 mg Oral TID WC  . docusate sodium  100 mg Oral BID  . feeding supplement (NEPRO CARB STEADY)  237 mL Oral Q24H  . folic acid  1 mg Oral Daily  . lisinopril  10 mg Oral Daily  . multivitamin  1 tablet Oral QHS  . sevelamer  carbonate  800 mg Oral TID WC  . sodium chloride flush  3 mL Intravenous Q12H  . tuberculin  5 Units Intradermal Once   sodium chloride, acetaminophen **OR** acetaminophen, albuterol, bisacodyl, diphenhydrAMINE, diphenhydrAMINE, HYDROmorphone (DILAUDID) injection, HYDROmorphone, [DISCONTINUED] ondansetron **OR** ondansetron (ZOFRAN) IV, ondansetron, polyethylene glycol, sodium chloride flush  Assessment/ Plan:  [name] is a 35 y.o. black male with HIV/AIDS, ESRD, hypertension, sickle cell disease  1. ESRD: Seems have last received outpatient dialysis in 08/2014 in [facility] followed by Dr. Birdie Riddle. However previously followed in [place], [place] going to [facility]. Patient states he was incarcerated in [place] where he was getting dialysis in the jail facility - may need records related to previous outpatient dialysis - He now wants to live in [place] with his room mate (near [place] outlets, application has been sent to [facility]) - Dialysis liason aware.   - Continue TTS schedule for now.     2. 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 Secondary Hyperparathyroidism:  Phosphorus 5.2  - not currently on a binder.    LOS: 7 Bruce Mayers 2/2/201711:26 AM

## 2016-01-21 NOTE — Progress Notes (Signed)
01/21/2016  12:25 PM  Attempted to pull IV Dilaudid for pt [name] in dialysis.  Got message that med was not loaded in this station.  Spoke with pharmacy staff and they will resolve the problem.  Pt called again, obviously frustrated at the delay.  Assured pt that I will bring the medicine as soon as possible.  Bradly Chris, RN

## 2016-01-21 NOTE — Progress Notes (Signed)
[facility]   PATIENT NAME: [name]    MR#:  [id number]  DATE OF BIRTH:  1981/12/18  SUBJECTIVE:  Patient currently in hemodialysis, still complains of subjective nausea no vomiting also states the pain is somewhat better controlled  REVIEW OF SYSTEMS:  CONSTITUTIONAL: No fever, fatigue or weakness.  EYES: No blurred or double vision.  EARS, NOSE, AND THROAT: No tinnitus or ear pain.  RESPIRATORY: No cough, shortness of breath, wheezing or hemoptysis.  CARDIOVASCULAR: No chest pain, orthopnea, edema.  GASTROINTESTINAL: Positive nausea, denies vomiting, diarrhea or abdominal pain.  GENITOURINARY: No dysuria, hematuria.  ENDOCRINE: No polyuria, nocturia,  HEMATOLOGY: No anemia, easy bruising or bleeding SKIN: No rash or lesion. MUSCULOSKELETAL: No joint pain or arthritis.   NEUROLOGIC: No tingling, numbness, weakness.  PSYCHIATRY: No anxiety or depression.   DRUG ALLERGIES:  No Known Allergies  VITALS:  Blood pressure 170/89, pulse 82, temperature 98.1 F (36.7 C), temperature source Oral, resp. rate 12, height  (1.702 m), weight 98.4 kg (216 lb 14.9 oz), SpO2 95 %.  PHYSICAL EXAMINATION:  VITAL SIGNS: Filed Vitals:   01/21/16 1030 01/21/16 1100  BP: 152/95 170/89  Pulse: 89 82  Temp:    Resp: 12 12   GENERAL:35 y.o.male currently in no acute distress. Currently undergoing dialysis HEAD: Normocephalic, atraumatic.  EYES: Pupils equal, round, reactive to light. Extraocular muscles intact. No scleral icterus.  MOUTH: Moist mucosal membrane. Dentition intact. No abscess noted.  EAR, NOSE, THROAT: Clear without exudates. No external lesions.  NECK: Supple. No thyromegaly. No nodules. No JVD.  PULMONARY: Clear to ascultation, without wheeze rails or rhonci. No use of accessory muscles, Good respiratory effort. good air entry bilaterally CHEST: Nontender to palpation.  CARDIOVASCULAR: S1 and S2. Regular rate and  rhythm. No murmurs, rubs, or gallops. No edema. Pedal pulses 2+ bilaterally.  GASTROINTESTINAL: Soft, nontender, nondistended. No masses. Positive bowel sounds. No hepatosplenomegaly.  MUSCULOSKELETAL: No swelling, clubbing, or edema. Range of motion full in all extremities.  NEUROLOGIC: Cranial nerves II through XII are intact. No gross focal neurological deficits. Sensation intact. Reflexes intact.  SKIN: No ulceration, lesions, rashes, or cyanosis. Skin warm and dry. Turgor intact.  PSYCHIATRIC: Mood, affect within normal limits. The patient is awake, alert and oriented x 3. Insight, judgment intact.      LABORATORY PANEL:   CBC  Recent Labs Lab 01/21/16 1028  WBC 2.0*  HGB 9.0*  HCT 28.4*  PLT 55*   ------------------------------------------------------------------------------------------------------------------  Chemistries   Recent Labs Lab 01/21/16 1028  NA 132*  K 5.2*  CL 98*  CO2 27  GLUCOSE 74  BUN 40*  CREATININE 7.31*  CALCIUM 7.7*   ------------------------------------------------------------------------------------------------------------------  Cardiac Enzymes No results for input(s): TROPONINI in the last 168 hours. ------------------------------------------------------------------------------------------------------------------  RADIOLOGY:  No results found.  EKG:   Orders placed or performed during the hospital encounter of 11/15/14  . EKG test  . EKG 12-Lead  . EKG test  . EKG 12-Lead  . EKG 12-Lead  . EKG 12-Lead    ASSESSMENT AND PLAN:    35 y.o. African-American gentleman history of HIV/AIDS, ESRD noncompliant with dialysis, essential hypertension, sickle cell disease presented with uremic symptoms  1. ESRD with uremia:  HD as per nephrology, scheduled for TTS-working with dialysis liaison O's management for placement   2. sickle cell disease not in crisis:  Holding Hydroxyurea- Previously refused fentanyl  patch Transition from IV to oral pain medications  3.  Essential Hypertension: Blood pressure remains elevated however, better controlled previously Continue amlodipine , lisinopril, hydralazine follow blood pressure postdialysis  4. HIV: He states his CD4 count is high and is not taking medications. Dr Sampson Goon has arranged outpatient HIV clinic in [facility] follow-up in 2 weeks   5. Pancytopenia- prob adverse effect of hydrea - oncology input appreciated continue to hold Hydrea until followed up at sickle cell clinic.   All the records are reviewed and case discussed with Care Management/Social Workerr. Management plans discussed with the patient, family and they are in agreement.  CODE STATUS: Full  TOTAL TIME TAKING CARE OF THIS PATIENT: 28 minutes.   POSSIBLE D/C IN one DAYS, DEPENDING ON CLINICAL CONDITION.   Hower,  Mardi Mainland.D on 01/21/2016 at 12:07 PM  Between 7am to 6pm - Pager - [phone]  After 6pm: House Pager: - [phone]  [facility]  Office  [phone]  CC: Primary care physician; Pcp Not In System

## 2016-01-21 NOTE — Care Management Note (Signed)
Patient requested placement at [facility] based on the location from his address in [place] on 01/19/16.    Clinic has been slow to accept patient.  The Clinic Director requested that I meet with patient to ask the following questions.  1 is patient going to be staying in the area for the next few months  2  to confirm the address furnished from patient.  Hospital demographics has a [place] address. 3 why did he not show for outpatient dialysis planned for him at [facility] on 12/25/15    Today when me and Bevelyn Ngo RN CM visited with patient to confirm his past dialysis history he reports again that he only received dialysis while incarcerated in [place].  Prior to meeting with patient  I was able to research patient activity with [facility] and found he has 4 placements and 2 of which he never came to center to start treatment. Patient denied that he was never a patient at any hospital or dialysis centers that I questioned him about with [facility] and recent [facility] placement.  He told me and Judeth Cornfield he had a problem with someone stealing his  Medicaid and using it at other places.  Patient does not have Medicare part D nor Medicaid to cover medications.  This afternoon Dr. Thedore Mins has agreed to accept patient at [facility].  I have sent medical records to [facility] and notified clinic of patients need for treatment.  Patient will have to enter a behavior contract at clinic per [facility] policy.  I hope to have placement at [facility]  by noon on Friday 01/22/16.  I will call patient to update him on the clinic change.  Ivor Reining  Dialysis Liaison  [phone]  Ivor Reining Dialysis Liaison  [phone]

## 2016-01-21 NOTE — Progress Notes (Signed)
01/21/2016  12:00  Pt called from dialysis, requesting pain medicine and benadryl.  Pulled oral pain medicine and brought to the pt in dialysis.  Pt stated he was supposed to have IV.  I explained that IV pain med was discontinued.  Pt threatened to end dialysis treatment early.  Dr had ordered IV Dilaudid only to be used if oral pain med fails.  Will give IV dose this time.   Bradly Chris, RN

## 2016-01-22 NOTE — Care Management (Signed)
I updated the patient that we still do not have an outpatient HD center arranged.  Patient states that he has now changed his mind and plans on living at his [facility] address and would like and outpatient facility in [place].  Selena Batten Riddle notified.  Her full note is to follow.

## 2016-01-22 NOTE — Care Management Important Message (Signed)
Important Message  Patient Details  Name: [name] MRN: [id number] Date of Birth: 05/25/1981   Medicare Important Message Given:  Yes    Olegario Messier A Wilkes Potvin 01/22/2016, 1:39 PM

## 2016-01-22 NOTE — Progress Notes (Signed)
Patient signed AMA form. Patient encouraged to stay and continued to be treated, benefits and consequences explained to patient. Patient still wants to leave AMA. Patient wheeled to ER entrance per his request with all his belongings. Anselm Jungling

## 2016-01-22 NOTE — Progress Notes (Signed)
[facility]  ROUNDING NOTE   Subjective:   Patient is doing fair No acute complains Laying comfortably in bed    Objective:  Vital signs in last 24 hours:  Temp:  [98 F (36.7 C)-99.1 F (37.3 C)] 98.9 F (37.2 C) (02/03 0455) Pulse Rate:  [89-109] 108 (02/03 0455) Resp:  [13-19] 19 (02/03 0455) BP: (128-184)/(55-92) 128/70 mmHg (02/03 0455) SpO2:  [90 %-92 %] 92 % (02/03 0455) Weight:  [98.34 kg (216 lb 12.8 oz)] 98.34 kg (216 lb 12.8 oz) (02/03 0500)  Weight change:  Filed Weights   01/20/16 0624 01/21/16 0930 01/22/16 0500  Weight: 97.16 kg (214 lb 3.2 oz) 98.4 kg (216 lb 14.9 oz) 98.34 kg (216 lb 12.8 oz)    Intake/Output: I/O last 3 completed shifts: In: 420 [P.O.:420] Out: 1000 [Other:1000]   Intake/Output this shift:     Physical Exam: General: NAD, laying in bed  Head: Normocephalic, atraumatic. Moist oral mucosal membranes  Eyes: Anicteric,  Neck: supple  Lungs:  Clear to auscultation  Heart: Regular rate and rhythm  Abdomen:  Soft, nontender,   Extremities: trace peripheral edema.  Neurologic: Nonfocal, moving all four extremities  Skin: No lesions  Access: Right arm AVF, left arm clotted AVG    Basic Metabolic Panel:  Recent Labs Lab 01/16/16 1130 01/18/16 0500 01/19/16 0947 01/20/16 0537 01/21/16 1028  NA 137 134*  --  136 132*  K 4.2 4.3  --  4.5 5.2*  CL 104 101  --  101 98*  CO2 26 26  --  30 27  GLUCOSE 89 112*  --  77 74  BUN 30* 33*  --  29* 40*  CREATININE 7.51* 7.07*  --  6.15* 7.31*  CALCIUM 7.8* 7.7*  --  7.8* 7.7*  PHOS 5.4*  5.4*  --  5.2*  --   --     Liver Function Tests:  Recent Labs Lab 01/16/16 1130  ALBUMIN 2.2*   No results for input(s): LIPASE, AMYLASE in the last 168 hours. No results for input(s): AMMONIA in the last 168 hours.  CBC:  Recent Labs Lab 01/17/16 0445 01/18/16 0500 01/19/16 0947 01/20/16 0537 01/21/16 1028  WBC 1.5* 1.8* 1.8* 2.0* 2.0*  HGB 8.9* 8.9* 9.4* 8.8* 9.0*   HCT 27.9* 27.9* 29.4* 27.3* 28.4*  MCV 92.4 92.6 91.8 93.6 92.4  PLT 57* 51* 59* 52* 55*    Cardiac Enzymes: No results for input(s): CKTOTAL, CKMB, CKMBINDEX, TROPONINI in the last 168 hours.  BNP: Invalid input(s): POCBNP  CBG: No results for input(s): GLUCAP in the last 168 hours.  Microbiology: Results for orders placed or performed during the hospital encounter of 07/06/14  Blood culture (routine x 2)     Status: None   Collection Time: 07/06/14  4:58 AM  Result Value Ref Range Status   Specimen Description BLOOD RIGHT HAND  Final   Special Requests BOTTLES DRAWN AEROBIC AND ANAEROBIC [facility] EACH  Final   Culture  Setup Time   Final    07/06/2014 18:07 Performed at [facility]   Culture   Final    NO GROWTH 5 DAYS Performed at [facility]   Report Status 07/12/2014 FINAL  Final    Coagulation Studies: No results for input(s): LABPROT, INR in the last 72 hours.  Urinalysis: No results for input(s): COLORURINE, LABSPEC, PHURINE, GLUCOSEU, HGBUR, BILIRUBINUR, KETONESUR, PROTEINUR, UROBILINOGEN, NITRITE, LEUKOCYTESUR in the last 72 hours.  Invalid input(s): APPERANCEUR    Imaging:  No results found.   Medications:     . amLODipine  10 mg Oral Daily  . calcitRIOL  0.25 mcg Oral Daily  . calcium acetate  2,001 mg Oral TID WC  . docusate sodium  100 mg Oral BID  . feeding supplement (NEPRO CARB STEADY)  237 mL Oral Q24H  . folic acid  1 mg Oral Daily  . lisinopril  10 mg Oral Daily  . multivitamin  1 tablet Oral QHS  . polyethylene glycol  17 g Oral Daily  . sevelamer carbonate  800 mg Oral TID WC  . sodium chloride flush  3 mL Intravenous Q12H   sodium chloride, acetaminophen **OR** acetaminophen, albuterol, bisacodyl, diphenhydrAMINE, diphenhydrAMINE, HYDROmorphone, HYDROmorphone, HYDROmorphone, [DISCONTINUED] ondansetron **OR** ondansetron (ZOFRAN) IV, ondansetron, sodium chloride flush  Assessment/ Plan:  [name] is a  35 y.o. black male with HIV/AIDS, ESRD, hypertension, sickle cell disease  1. ESRD: Seems have last received outpatient dialysis in 08/2014 in [facility] followed by Dr. Birdie Riddle. However previously followed in [place], [place] going to [facility]. Patient states he was incarcerated in [place] where he was getting dialysis in the jail facility - may need records related to previous outpatient dialysis - He now wants to live in [place] with his room mate (near [place] outlets, application has been sent to [facility]) - Dialysis liason aware.   - Continue TTS schedule for now.     2. 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 Secondary Hyperparathyroidism:  Phosphorus 5.2  - not currently on a binder.    LOS: 8 [name] 2/3/201712:26 PM

## 2016-01-22 NOTE — Progress Notes (Signed)
[name]    MR#:  [id number]  DATE OF BIRTH:  Apr 15, 1981  SUBJECTIVE:  No complaints at this time Denies fevers chills states nausea improved  REVIEW OF SYSTEMS:  CONSTITUTIONAL: No fever, fatigue or weakness.  EYES: No blurred or double vision.  EARS, NOSE, AND THROAT: No tinnitus or ear pain.  RESPIRATORY: No cough, shortness of breath, wheezing or hemoptysis.  CARDIOVASCULAR: No chest pain, orthopnea, edema.  GASTROINTESTINAL: No nausea, vomiting, diarrhea or abdominal pain.  GENITOURINARY: No dysuria, hematuria.  ENDOCRINE: No polyuria, nocturia,  HEMATOLOGY: No anemia, easy bruising or bleeding SKIN: No rash or lesion. MUSCULOSKELETAL: No joint pain or arthritis.   NEUROLOGIC: No tingling, numbness, weakness.  PSYCHIATRY: No anxiety or depression.   DRUG ALLERGIES:  No Known Allergies  VITALS:  Blood pressure 128/70, pulse 108, temperature 98.9 F (37.2 C), temperature source Oral, resp. rate 19, height  (1.702 m), weight 98.34 kg (216 lb 12.8 oz), SpO2 92 %.  PHYSICAL EXAMINATION:  VITAL SIGNS: Filed Vitals:   01/21/16 2041 01/22/16 0455  BP: 131/55 128/70  Pulse: 109 108  Temp: 99.1 F (37.3 C) 98.9 F (37.2 C)  Resp: 18 19   GENERAL:34 y.o.male currently in no acute distress.  HEAD: Normocephalic, atraumatic.  EYES: Pupils equal, round, reactive to light. Extraocular muscles intact. No scleral icterus.  MOUTH: Moist mucosal membrane. Dentition intact. No abscess noted.  EAR, NOSE, THROAT: Clear without exudates. No external lesions.  NECK: Supple. No thyromegaly. No nodules. No JVD.  PULMONARY: Clear to ascultation, without wheeze rails or rhonci. No use of accessory muscles, Good respiratory effort. good air entry bilaterally CHEST: Nontender to palpation.  CARDIOVASCULAR: S1 and S2. Regular rate and rhythm. No murmurs, rubs, or gallops. No edema. Pedal pulses 2+  bilaterally.  GASTROINTESTINAL: Soft, nontender, nondistended. No masses. Positive bowel sounds. No hepatosplenomegaly.  MUSCULOSKELETAL: No swelling, clubbing, or edema. Range of motion full in all extremities.  NEUROLOGIC: Cranial nerves II through XII are intact. No gross focal neurological deficits. Sensation intact. Reflexes intact.  SKIN: No ulceration, lesions, rashes, or cyanosis. Skin warm and dry. Turgor intact.  PSYCHIATRIC: Mood, affect within normal limits. The patient is awake, alert and oriented x 3. Insight, judgment intact.      LABORATORY PANEL:   CBC  Recent Labs Lab 01/21/16 1028  WBC 2.0*  HGB 9.0*  HCT 28.4*  PLT 55*   ------------------------------------------------------------------------------------------------------------------  Chemistries   Recent Labs Lab 01/21/16 1028  NA 132*  K 5.2*  CL 98*  CO2 27  GLUCOSE 74  BUN 40*  CREATININE 7.31*  CALCIUM 7.7*   ------------------------------------------------------------------------------------------------------------------  Cardiac Enzymes No results for input(s): TROPONINI in the last 168 hours. ------------------------------------------------------------------------------------------------------------------  RADIOLOGY:  No results found.  EKG:   Orders placed or performed during the hospital encounter of 11/15/14  . EKG test  . EKG 12-Lead  . EKG test  . EKG 12-Lead  . EKG 12-Lead  . EKG 12-Lead    ASSESSMENT AND PLAN:    35 y.o. African-American gentleman history of HIV/AIDS, ESRD noncompliant with dialysis, essential hypertension, sickle cell disease presented with uremic symptoms  1. ESRD on hemodialysis:  HD as per nephrology, scheduled for TTS-working with dialysis liaison for placement   2. sickle cell disease not in crisis:  Holding Hydroxyurea- Previously refused fentanyl patch Transition from IV to oral pain medications, minimal anxiety medications  3.  Essential Hypertension: Blood pressure  was elevated during dialysis, otherwise stable Continue amlodipine , lisinopril, hydralazine   4. HIV: He states his CD4 count is high and is not taking medications. Dr Sampson Goon has arranged outpatient HIV clinic in [facility] follow-up in 2 weeks    5. Pancytopenia- prob adverse effect of hydrea - oncology input appreciated continue to hold Hydrea until followed up at sickle cell clinic.  6. Venous thrombosis and prophylactic: Thrombocytopenic     All the records are reviewed and case discussed with Care Management/Social Workerr. Management plans discussed with the patient, family and they are in agreement.  CODE STATUS: Full  TOTAL TIME TAKING CARE OF THIS PATIENT: 28 minutes.   POSSIBLE D/C IN one DAYS, DEPENDING ON CLINICAL CONDITION./Placement   Gay Moncivais,  Mardi Mainland.D on 01/22/2016 at 12:24 PM  Between 7am to 6pm - Pager - [phone]  After 6pm: House Pager: - [phone]  [facility]  Office  [phone]  CC: Primary care physician; Pcp Not In System

## 2016-01-22 NOTE — Progress Notes (Signed)
Patient requested IV dilaudid for pain. When explained to patient that the doctor had discontinued the IV dose and ordered PO dilaudid for pain. Patient requested that I call the on call doctor and get a one time dose of IV dilaudid. I called and spoke with Dr.Willis about patient request for IV dilaudid. Dr. Anne Hahn verbalized that we needed to transition to PO dilaudid and that he would not order a one time dose of IV dilaudid. This was explained to the patient and patient verbalized " bring my AMA", I tried to get patient to wait and try the PO dilaudid but he di not want to. Anselm Jungling

## 2016-01-22 NOTE — Progress Notes (Signed)
Nurse called to inform that pt requesting IV dilaudid.  Here for sickle cell crisis.  Was transitioned from IV dilaudid to PO dilaudid today.  When informed he should use PO dilaudid pt stated he will leave AMA.  Informed that medical recommendation is that he stay at least through tonight to monitor response to PO pain medication and transition his care to discharge appropriately, especially pending dialysis tomorrow.  Kristeen Miss [facility] 01/22/2016, 8:43 PM

## 2016-01-22 NOTE — Care Management (Signed)
Awaiting notification from Ivor Reining of outpatient dialysis  arrangements

## 2016-01-25 NOTE — Discharge Summary (Signed)
[facility] - [place] at [facility]   PATIENT NAME: [name]    MR#:  [id number]  DATE OF BIRTH:  [birth year]  DATE OF ADMISSION:  01/14/2016 ADMITTING PHYSICIAN: Milagros Loll, MD  DATE OF DISCHARGE: 01/22/2016  9:28 PM  PRIMARY CARE PHYSICIAN: Pcp Not In System    ADMISSION DIAGNOSIS:  Hb-SS disease with crisis (HCC) [D57.00] Chronic kidney disease, unspecified stage [N18.9]  DISCHARGE DIAGNOSIS:  Uremia secondary to end-stage renal disease on hemodialysis Sickle cell disease with pain crisis HIV noncompliant with treatment Pancytopenia  SECONDARY DIAGNOSIS:   Past Medical History  Diagnosis Date  . Sickle cell anemia (HCC)   . ESRD on hemodialysis (HCC)   . HIV disease (HCC)   . Drug-seeking behavior   . Sickle cell anemia (HCC)     States Dx at 35 yo, s/p multiple prior transfusions, typically with pain in legs, back  . HIV (human immunodeficiency virus infection) (HCC) 2003    Followed by Harless Nakayama at [facility] [place]   . HTN (hypertension)   . Pulmonary mass     Unclear history. States PCP was following a pulmonary mass with serial imaging  . Pancreatitis 2012    Denies any alcohol use   . Hypertension   . Blood transfusion     "several"  . End stage renal disease on dialysis [facility]) HD in 2009     Followed by Dr. Buelah Manis in [place]. States due to HTN/DM.   Marland Kitchen ESRD (end stage renal disease) on dialysis [facility])     "[facility]; [place], [place]; TTS" (08/26/2014)  . Renal failure     HOSPITAL COURSE:  [name]  is a 35 y.o. male admitted 01/14/2016 with chief complaint diffuse pain. Please see H&P performed by Dr. Elpidio Anis for further information. On admission it was noted that patient has been noncompliant with hemodialysis not receded over one year. Nephrology consult performed to restart hemodialysis. He was able to tolerate dialysis without any complications however continue to complain of nausea vomiting throughout his  hospital stay. Continuously requesting IV narcotics as opposed to his home dose of oral Dilaudid. He didn't into some issues and finding outpatient placement and set up for dialysis. On transition the patient from IV to oral narcotics he became acutely agitated requesting that he get "one more dose" on this request was denied he left the hospital AGAINST MEDICAL ADVICE. Through documentation he was obviously advised against this so that he could have appropriate transition of care into the outpatient world. Including follow-up with infectious disease and nephrology for dialysis.  DISCHARGE CONDITIONS:   AGAINST MEDICAL ADVICE  CONSULTS OBTAINED:  Treatment Team:  Lamont Dowdy, MD Clydie Braun, MD Jeralyn Ruths, MD  DRUG ALLERGIES:  No Known Allergies  DISCHARGE MEDICATIONS:   Discharge Medication List as of 01/22/2016  9:28 PM    CONTINUE these medications which have NOT CHANGED   Details  amLODipine (NORVASC) 5 MG tablet Take 5 mg by mouth daily., Until Discontinued, Historical Med    b complex-vitamin c-folic acid (NEPHRO-VITE) 0.8 MG TABS tablet Take 1 tablet by mouth daily. , Until Discontinued, Historical Med    calcitRIOL (ROCALTROL) 0.25 MCG capsule Take 0.25 mcg by mouth daily., Until Discontinued, Historical Med    calcium acetate (PHOSLO) 667 MG capsule Take 2,001 mg by mouth 3 (three) times daily with meals., Until Discontinued, Historical Med    diphenhydrAMINE (BENADRYL) 50 MG capsule Take 50 mg by mouth every 6 (six) hours  as needed for itching or allergies., Historical Med    folic acid (FOLVITE) 1 MG tablet Take 1 mg by mouth daily., Until Discontinued, Historical Med    HYDROmorphone (DILAUDID) 8 MG tablet Take 8 mg by mouth every 4 (four) hours as needed for severe pain., Until Discontinued, Historical Med    hydroxyurea (HYDREA) 500 MG capsule Take 500 mg by mouth 2 (two) times daily. May take with food to minimize GI side effects., Until Discontinued,  Historical Med    lisinopril (PRINIVIL,ZESTRIL) 10 MG tablet Take 10 mg by mouth daily., Until Discontinued, Historical Med    ondansetron (ZOFRAN ODT) 4 MG disintegrating tablet Take 1 tablet (4 mg total) by mouth every 8 (eight) hours as needed for nausea or vomiting., Starting 01/13/2016, Until Discontinued, Print    sevelamer carbonate (RENVELA) 800 MG tablet Take 800 mg by mouth 3 (three) times daily with meals., Until Discontinued, Historical Med         DISCHARGE INSTRUCTIONS:  AGAINST MEDICAL ADVICE  DIET:  Renal diet  DISCHARGE CONDITION:  AGAINST MEDICAL ADVICE  ACTIVITY:  Activity as tolerated  OXYGEN:  Home Oxygen: No.   Oxygen Delivery: room air  DISCHARGE LOCATION:  home - AGAINST MEDICAL ADVICE  If you experience worsening of your admission symptoms, develop shortness of breath, life threatening emergency, suicidal or homicidal thoughts you must seek medical attention immediately by calling 911 or calling your MD immediately  if symptoms less severe.  You Must read complete instructions/literature along with all the possible adverse reactions/side effects for all the Medicines you take and that have been prescribed to you. Take any new Medicines after you have completely understood and accpet all the possible adverse reactions/side effects.   Please note  You were cared for by a hospitalist during your hospital stay. If you have any questions about your discharge medications or the care you received while you were in the hospital after you are discharged, you can call the unit and asked to speak with the hospitalist on call if the hospitalist that took care of you is not available. Once you are discharged, your primary care physician will handle any further medical issues. Please note that NO REFILLS for any discharge medications will be authorized once you are discharged, as it is imperative that you return to your primary care physician (or establish a  relationship with a primary care physician if you do not have one) for your aftercare needs so that they can reassess your need for medications and monitor your lab values.    On the day of Discharge:  Physical exam unable to be performed as patient left AGAINST MEDICAL ADVICE in the middle of the night.  DATA REVIEW:   CBC  Recent Labs Lab 01/21/16 1028  WBC 2.0*  HGB 9.0*  HCT 28.4*  PLT 55*    Chemistries   Recent Labs Lab 01/21/16 1028  NA 132*  K 5.2*  CL 98*  CO2 27  GLUCOSE 74  BUN 40*  CREATININE 7.31*  CALCIUM 7.7*    Cardiac Enzymes No results for input(s): TROPONINI in the last 168 hours.  Microbiology Results  Results for orders placed or performed during the hospital encounter of 07/06/14  Blood culture (routine x 2)     Status: None   Collection Time: 07/06/14  4:58 AM  Result Value Ref Range Status   Specimen Description BLOOD RIGHT HAND  Final   Special Requests BOTTLES DRAWN AEROBIC AND ANAEROBIC [facility] EACH  Final   Culture  Setup Time   Final    07/06/2014 18:07 Performed at [facility]   Culture   Final    NO GROWTH 5 DAYS Performed at [facility]   Report Status 07/12/2014 FINAL  Final    RADIOLOGY:  No results found.   Management plans discussed with the patient, family and they are in agreement.  CODE STATUS:  Code Status History    Date Active Date Inactive Code Status Order ID Comments User Context   01/14/2016 11:28 AM 01/23/2016 12:28 AM Full Code [id number]  Milagros Loll, MD ED   11/15/2014  5:53 PM 11/18/2014  6:20 PM Full Code [id number]  [name] Paris, MD ED   08/26/2014  6:47 PM 08/30/2014  8:49 PM Full Code [id number]  Starleen Arms, MD Inpatient   07/06/2014  6:08 AM 07/06/2014  9:56 AM Full Code [id number]  Lynden Oxford, MD ED   03/11/2012  2:28 AM 03/11/2012 10:33 AM Full Code [id number]  Margie Ege, RN Inpatient   03/10/2012  4:36 AM 03/11/2012 12:23 AM Full Code [id number]  Cybill Arvella Nigh, RN  Inpatient      TOTAL TIME TAKING CARE OF THIS PATIENT: 28 minutes.    Hower,  Mardi Mainland.D on 01/25/2016 at 1:36 PM  Between 7am to 6pm - Pager - [phone]  After 6pm go to [email or web address] - password [email or web address]  [facility]  Office  [phone]  CC: Primary care physician; Pcp Not In System

## 2016-01-27 ENCOUNTER — Emergency Department (HOSPITAL_COMMUNITY)
Admission: EM | Admit: 2016-01-27 | Discharge: 2016-01-27 | Discharge status: Against Medical Advice | Payer: Medicare Other | Attending: Emergency Medicine | Admitting: Emergency Medicine

## 2016-01-27 ENCOUNTER — Encounter (HOSPITAL_COMMUNITY): Payer: Self-pay | Admitting: *Deleted

## 2016-01-27 DIAGNOSIS — Z8719 Personal history of other diseases of the digestive system: Secondary | ICD-10-CM | POA: Diagnosis not present

## 2016-01-27 DIAGNOSIS — G8929 Other chronic pain: Secondary | ICD-10-CM

## 2016-01-27 DIAGNOSIS — M2550 Pain in unspecified joint: Secondary | ICD-10-CM | POA: Diagnosis present

## 2016-01-27 DIAGNOSIS — I120 Hypertensive chronic kidney disease with stage 5 chronic kidney disease or end stage renal disease: Secondary | ICD-10-CM | POA: Insufficient documentation

## 2016-01-27 DIAGNOSIS — Z992 Dependence on renal dialysis: Secondary | ICD-10-CM | POA: Diagnosis not present

## 2016-01-27 DIAGNOSIS — N186 End stage renal disease: Secondary | ICD-10-CM | POA: Insufficient documentation

## 2016-01-27 DIAGNOSIS — F6810 Factitious disorder, unspecified: Secondary | ICD-10-CM | POA: Insufficient documentation

## 2016-01-27 DIAGNOSIS — F1910 Other psychoactive substance abuse, uncomplicated: Secondary | ICD-10-CM | POA: Insufficient documentation

## 2016-01-27 DIAGNOSIS — Z765 Malingerer [conscious simulation]: Secondary | ICD-10-CM | POA: Diagnosis not present

## 2016-01-27 DIAGNOSIS — B20 Human immunodeficiency virus [HIV] disease: Secondary | ICD-10-CM | POA: Diagnosis not present

## 2016-01-27 DIAGNOSIS — Z79899 Other long term (current) drug therapy: Secondary | ICD-10-CM | POA: Diagnosis not present

## 2016-01-27 DIAGNOSIS — F17210 Nicotine dependence, cigarettes, uncomplicated: Secondary | ICD-10-CM | POA: Insufficient documentation

## 2016-01-27 DIAGNOSIS — Z862 Personal history of diseases of the blood and blood-forming organs and certain disorders involving the immune mechanism: Secondary | ICD-10-CM | POA: Diagnosis not present

## 2016-01-27 HISTORY — DX: Patient's noncompliance with other medical treatment and regimen: Z91.19

## 2016-01-27 HISTORY — DX: Factitious disorder imposed on self, unspecified: F68.10

## 2016-01-27 HISTORY — DX: Patient's noncompliance with other medical treatment and regimen due to unspecified reason: Z91.199

## 2016-01-27 LAB — CBC
HCT: 31.2 % — ABNORMAL LOW (ref 39.0–52.0)
HEMOGLOBIN: 9.8 g/dL — AB (ref 13.0–17.0)
MCH: 29.3 pg (ref 26.0–34.0)
MCHC: 31.4 g/dL (ref 30.0–36.0)
MCV: 93.4 fL (ref 78.0–100.0)
PLATELETS: 103 10*3/uL — AB (ref 150–400)
RBC: 3.34 MIL/uL — AB (ref 4.22–5.81)
RDW: 17.9 % — ABNORMAL HIGH (ref 11.5–15.5)
WBC: 3.1 10*3/uL — AB (ref 4.0–10.5)

## 2016-01-27 LAB — COMPREHENSIVE METABOLIC PANEL
ALT: 9 U/L — AB (ref 17–63)
ANION GAP: 13 (ref 5–15)
AST: 23 U/L (ref 15–41)
Albumin: 2.2 g/dL — ABNORMAL LOW (ref 3.5–5.0)
Alkaline Phosphatase: 112 U/L (ref 38–126)
BUN: 35 mg/dL — ABNORMAL HIGH (ref 6–20)
CHLORIDE: 97 mmol/L — AB (ref 101–111)
CO2: 29 mmol/L (ref 22–32)
CREATININE: 7.75 mg/dL — AB (ref 0.61–1.24)
Calcium: 8 mg/dL — ABNORMAL LOW (ref 8.9–10.3)
GFR, EST AFRICAN AMERICAN: 9 mL/min — AB (ref >60)
GFR, EST NON AFRICAN AMERICAN: 8 mL/min — AB (ref >60)
Glucose, Bld: 126 mg/dL — ABNORMAL HIGH (ref 65–99)
POTASSIUM: 4.1 mmol/L (ref 3.5–5.1)
SODIUM: 139 mmol/L (ref 135–145)
Total Bilirubin: 0.8 mg/dL (ref 0.3–1.2)
Total Protein: 7.5 g/dL (ref 6.5–8.1)

## 2016-01-27 LAB — RETICULOCYTES
RBC.: 3.39 MIL/uL — ABNORMAL LOW (ref 4.22–5.81)
RETIC COUNT ABSOLUTE: 37.3 10*3/uL (ref 19.0–186.0)
Retic Ct Pct: 1.1 % (ref 0.4–3.1)

## 2016-01-27 LAB — LIPASE, BLOOD: LIPASE: 33 U/L (ref 11–51)

## 2016-01-27 MED ORDER — DIPHENHYDRAMINE HCL 25 MG PO CAPS
25.0000 mg | ORAL_CAPSULE | ORAL | Status: DC | PRN
  Administered 2016-01-27: 25 mg via ORAL
  Filled 2016-01-27: qty 1

## 2016-01-27 MED ORDER — HYDROMORPHONE HCL 2 MG/ML IJ SOLN
0.0375 mg/kg | INTRAMUSCULAR | Status: DC | PRN
Start: 2016-01-27 — End: 2016-01-27

## 2016-01-27 MED ORDER — HYDROMORPHONE HCL 1 MG/ML IJ SOLN: 0.0250 mg/kg | INTRAMUSCULAR | Status: AC

## 2016-01-27 MED ORDER — HYDROMORPHONE HCL 1 MG/ML IJ SOLN
2.0000 mg | Freq: Once | INTRAMUSCULAR | Status: AC
  Administered 2016-01-27: 2 mg via INTRAVENOUS
  Filled 2016-01-27: qty 2

## 2016-01-27 MED ORDER — HYDROMORPHONE HCL 2 MG/ML IJ SOLN: 0.0313 mg/kg | INTRAMUSCULAR | Status: DC | PRN

## 2016-01-27 MED ORDER — HYDROMORPHONE HCL 2 MG/ML IJ SOLN
0.0313 mg/kg | INTRAMUSCULAR | Status: DC | PRN
Start: 2016-01-27 — End: 2016-01-27

## 2016-01-27 MED ORDER — HYDROMORPHONE HCL 2 MG/ML IJ SOLN: 4.0000 mg | INTRAMUSCULAR | Status: DC | PRN

## 2016-01-27 MED ORDER — HYDROMORPHONE HCL 1 MG/ML IJ SOLN: 3.0000 mg | INTRAMUSCULAR | Status: DC | PRN

## 2016-01-27 MED ORDER — HYDROMORPHONE HCL 2 MG/ML IJ SOLN: 0.0375 mg/kg | INTRAMUSCULAR | Status: DC | PRN

## 2016-01-27 MED ORDER — PROMETHAZINE HCL 25 MG PO TABS
25.0000 mg | ORAL_TABLET | ORAL | Status: DC | PRN
  Administered 2016-01-27: 25 mg via ORAL
  Filled 2016-01-27: qty 1

## 2016-01-27 MED ORDER — HYDROMORPHONE HCL 1 MG/ML IJ SOLN
0.0250 mg/kg | INTRAMUSCULAR | Status: AC
  Administered 2016-01-27: 2.45 mg via INTRAVENOUS
  Filled 2016-01-27: qty 3

## 2016-01-27 MED ORDER — PROMETHAZINE HCL 25 MG/ML IJ SOLN
25.0000 mg | Freq: Once | INTRAMUSCULAR | Status: AC
  Administered 2016-01-27: 25 mg via INTRAMUSCULAR
  Filled 2016-01-27: qty 1

## 2016-01-27 MED ORDER — DIPHENHYDRAMINE HCL 50 MG/ML IJ SOLN
12.5000 mg | Freq: Once | INTRAMUSCULAR | Status: AC
  Administered 2016-01-27: 12.5 mg via INTRAVENOUS
  Filled 2016-01-27: qty 1

## 2016-01-27 MED ORDER — HYDROMORPHONE HCL 1 MG/ML IJ SOLN: 1.0000 mg | INTRAMUSCULAR | Status: DC | PRN

## 2016-01-27 MED ORDER — DEXTROSE-NACL 5-0.45 % IV SOLN
INTRAVENOUS | Status: DC
Start: 2016-01-27 — End: 2016-01-28
  Administered 2016-01-27: 75 mL/h via INTRAVENOUS

## 2016-01-27 NOTE — ED Notes (Addendum)
Dr.rancour in room to speak to pt about multiple ED visit to various different locations and pt immediatly requested to sign out and leave AMA.

## 2016-01-27 NOTE — ED Notes (Signed)
Pt reports sickle cell pain to all joints and having n/v x 3 days, denies diarrhea. Pt has port he wants accessed for blood draw.

## 2016-01-27 NOTE — ED Provider Notes (Signed)
CSN: [id number]     Arrival date & time 01/27/16  1259 History   First MD Initiated Contact with Patient 01/27/16 1448     Chief Complaint  Patient presents with  . Sickle Cell Pain Crisis  . Emesis      HPI Pt was seen at 1445. Per pt, c/o gradual onset and persistence of constant "sickle cell pain" that began 3 days ago. Describes his pain as "in my joints" and per his usual sickle cell pain. Has been associated with several intermittent episodes of N/V. Pt left [facility] 5 days ago after being transitioned from IV to PO narcotics as inpatient. Denies CP/palpitations, no SOB/cough, no abd pain, no diarrhea, no fevers, no black or blood in stools or emesis.      Past Medical History  Diagnosis Date  . Sickle cell anemia (HCC)   . ESRD on hemodialysis (HCC)   . HIV disease (HCC)   . Drug-seeking behavior   . Sickle cell anemia (HCC)     States Dx at 35 yo, s/p multiple prior transfusions, typically with pain in legs, back  . HIV (human immunodeficiency virus infection) (HCC) 2003    Followed by Harless Nakayama at [facility] [place]   . HTN (hypertension)   . Pulmonary mass     Unclear history. States PCP was following a pulmonary mass with serial imaging  . Pancreatitis 2012    Denies any alcohol use   . Hypertension   . Blood transfusion     "several"  . End stage renal disease on dialysis [facility]) HD in 2009     Followed by Dr. Buelah Manis in [place]. States due to HTN/DM.   Marland Kitchen ESRD (end stage renal disease) on dialysis [facility])     "[facility]; [place], [place]; TTS" (08/26/2014)  . Renal failure   . Medical non-compliance    Past Surgical History  Procedure Laterality Date  . Hip surgery    . Dialysis fistula creation Left     "arm"  . Hip fracture surgery Right     "screw placed into joint"  . Av fistula placement Left     "arm"   Family History  Problem Relation Age of Onset  . Hypertension Father   . Diabetes      Uncles and grandfather  . Cervical cancer  Mother     Dec 46yo of cervical ca   Social History  Substance Use Topics  . Smoking status: Current Every Day Smoker -- 0.12 packs/day for 18 years    Types: Cigarettes  . Smokeless tobacco: Never Used  . Alcohol Use: No    Review of Systems ROS: Statement: All systems negative except as marked or noted in the HPI; Constitutional: Negative for fever and chills. ; ; Eyes: Negative for eye pain, redness and discharge. ; ; ENMT: Negative for ear pain, hoarseness, nasal congestion, sinus pressure and sore throat. ; ; Cardiovascular: Negative for chest pain, palpitations, diaphoresis, dyspnea and peripheral edema. ; ; Respiratory: Negative for cough, wheezing and stridor. ; ; Gastrointestinal: +N/V. Negative for diarrhea, abdominal pain, blood in stool, hematemesis, jaundice and rectal bleeding. . ; ; Genitourinary: Negative for dysuria, flank pain and hematuria. ; ; Musculoskeletal: +"joints pain." Negative for back pain and neck pain. Negative for swelling and trauma.; ; Skin: Negative for pruritus, rash, abrasions, blisters, bruising and skin lesion.; ; Neuro: Negative for headache, lightheadedness and neck stiffness. Negative for weakness, altered level of consciousness , altered mental status, extremity  weakness, paresthesias, involuntary movement, seizure and syncope.      Allergies  Review of patient's allergies indicates no known allergies.  Home Medications   Prior to Admission medications   Medication Sig Start Date End Date Taking? Authorizing Provider  amLODipine (NORVASC) 5 MG tablet Take 5 mg by mouth daily.   Yes Historical Provider, MD  b complex-vitamin c-folic acid (NEPHRO-VITE) 0.8 MG TABS tablet Take 1 tablet by mouth daily.    Yes Historical Provider, MD  calcitRIOL (ROCALTROL) 0.25 MCG capsule Take 0.25 mcg by mouth daily.   Yes Historical Provider, MD  calcium acetate (PHOSLO) 667 MG capsule Take 2,001 mg by mouth 3 (three) times daily with meals.   Yes Historical  Provider, MD  diphenhydrAMINE (BENADRYL) 50 MG capsule Take 50 mg by mouth every 6 (six) hours as needed for itching or allergies.   Yes Historical Provider, MD  folic acid (FOLVITE) 1 MG tablet Take 1 mg by mouth daily.   Yes Historical Provider, MD  HYDROmorphone (DILAUDID) 8 MG tablet Take 8 mg by mouth every 4 (four) hours as needed for severe pain.   Yes Historical Provider, MD  hydroxyurea (HYDREA) 500 MG capsule Take 500 mg by mouth 2 (two) times daily. May take with food to minimize GI side effects.   Yes Historical Provider, MD  lisinopril (PRINIVIL,ZESTRIL) 10 MG tablet Take 10 mg by mouth daily.   Yes Historical Provider, MD  ondansetron (ZOFRAN ODT) 4 MG disintegrating tablet Take 1 tablet (4 mg total) by mouth every 8 (eight) hours as needed for nausea or vomiting. 01/13/16  Yes Lyndal Pulley, MD  sevelamer carbonate (RENVELA) 800 MG tablet Take 800 mg by mouth 3 (three) times daily with meals.   Yes Historical Provider, MD   BP 152/88 mmHg  Pulse 95  Temp(Src) 98.7 F (37.1 C) (Oral)  Resp 23  SpO2 100% Physical Exam  1450: Physical examination:  Nursing notes reviewed; Vital signs and O2 SAT reviewed;  Constitutional: Well developed, Well nourished, Well hydrated, In no acute distress; Head:  Normocephalic, atraumatic; Eyes: EOMI, PERRL, No scleral icterus; ENMT: Mouth and pharynx normal, Mucous membranes moist; Neck: Supple, Full range of motion, No lymphadenopathy; Cardiovascular: Regular rate and rhythm, No gallop; Respiratory: Breath sounds clear & equal bilaterally, No wheezes.  Speaking full sentences with ease, Normal respiratory effort/excursion; Chest: Nontender, Movement normal; Abdomen: Soft, Nontender, Nondistended, Normal bowel sounds; Genitourinary: No CVA tenderness; Extremities: Pulses normal, No tenderness, No edema, No calf edema or asymmetry.; Neuro: AA&Ox3, Major CN grossly intact.  Speech clear. No gross focal motor or sensory deficits in extremities. Using handheld  electronic device during my exam.; Skin: Color normal, Warm, Dry.   ED Course  Procedures (including critical care time) Labs Review  Imaging Review  I have personally reviewed and evaluated these images and lab results as part of my medical decision-making.   EKG Interpretation None      MDM  MDM Reviewed: previous chart, nursing note and vitals Reviewed previous: labs     1650:   Sickle Cell protocol ordered. Labs pending. No N/V while in the ED. Sign out to Dr. Manus Gunning.   Samuel Jester, DO 01/27/16 [phone]

## 2016-01-27 NOTE — ED Provider Notes (Signed)
Care assumed from Dr. Clarene Duke.  Patient with reported sickle cell disease, HIV, ESRD, nausea and vomiting.  Patient states pain is somewhat improved after third dose of pain medication. States he is still vomiting though this has not been witnessed by nursing staff. His labs are stable. He is afebrile. No chest pain or shortness of breath. Patient feels that he needs admission for ongoing pain and reported vomiting which has not been witnessed.  Case discussed with Dr. Julian Reil. On comprehensive chart review there is concern for patient giving false names. Per care everywhere. There is documentation from [facility] the patient has a normal hemoglobin electrophoresis and no hemoglobin S and no diagnosis of sickle cell disease.  Discussed with patient at bedside with Dr. Julian Reil. Patient states this is not true and this must not be his information. He states he was diagnosed with sickle cell disease when he was young. He states he gave his proper name and birthday today. review of chart through care everywhere shows patient visits multiple hospitals and has been suspected of giving incorrect identity information in the past.  Patient states if he is not getting IV narcotics he will just leave. He will sign out AGAINST MEDICAL ADVICE.  Glynn Octave, MD 01/28/16 [phone]

## 2016-01-27 NOTE — Assessment & Plan Note (Signed)
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  Please see HGB electrophoresis preformed X2 at [facility], results reviewed "personally" by the assistant director of pathology at [facility]!

## 2016-01-27 NOTE — ED Notes (Signed)
MD Rancour at bedside 

## 2016-01-27 NOTE — ED Notes (Signed)
MD Mcmanus at bedside. Pt requesting IV nausea meds.

## 2016-01-30 LAB — HELPER T-LYMPH-CD4 (ARMC ONLY)
% CD 4 Pos. Lymph.: 9.5 % — ABNORMAL LOW (ref 30.8–58.5)
Absolute CD 4 Helper: 67 /uL — ABNORMAL LOW (ref 359–1519)
BASOS: 1 %
Basophils Absolute: 0 10*3/uL (ref 0.0–0.2)
EOS (ABSOLUTE): 0 10*3/uL (ref 0.0–0.4)
EOS: 2 %
HEMATOCRIT: 25.7 % — AB (ref 37.5–51.0)
HEMOGLOBIN: 8.6 g/dL — AB (ref 12.6–17.7)
IMMATURE GRANS (ABS): 0 10*3/uL (ref 0.0–0.1)
IMMATURE GRANULOCYTES: 1 %
Lymphocytes Absolute: 0.7 10*3/uL (ref 0.7–3.1)
Lymphs: 38 %
MCH: 30 pg (ref 26.6–33.0)
MCHC: 33.5 g/dL (ref 31.5–35.7)
MCV: 90 fL (ref 79–97)
MONOS ABS: 0.3 10*3/uL (ref 0.1–0.9)
Monocytes: 17 %
NEUTROS PCT: 41 %
NRBC: 0 % (ref 0–0)
Neutrophils Absolute: 0.7 10*3/uL — ABNORMAL LOW (ref 1.4–7.0)
Platelets: 67 10*3/uL — ABNORMAL LOW (ref 150–379)
RBC: 2.87 x10E6/uL — ABNORMAL LOW (ref 4.14–5.80)
RDW: 18.7 % — AB (ref 12.3–15.4)
WBC: 1.7 10*3/uL — ABNORMAL LOW (ref 3.4–10.8)

## 2016-01-30 LAB — REFLEX TO GENOSURE(R) MG: HIV GENOSURE(R) MG PDF: 0

## 2016-01-30 LAB — HIV-1 RNA ULTRAQUANT REFLEX TO GENTYP+
HIV-1 RNA BY PCR: 251000 {copies}/mL
HIV-1 RNA QUANT, LOG: 5.4 {Log_copies}/mL
